# Patient Record
Sex: Female | Born: 1935
Health system: Southern US, Community
[De-identification: ages and names within clinical notes are randomized; demographics above are authoritative.]

## PROBLEM LIST (undated history)

## (undated) DIAGNOSIS — M1990 Unspecified osteoarthritis, unspecified site: Secondary | ICD-10-CM

## (undated) DIAGNOSIS — R0600 Dyspnea, unspecified: Secondary | ICD-10-CM

## (undated) DIAGNOSIS — R32 Unspecified urinary incontinence: Secondary | ICD-10-CM

## (undated) DIAGNOSIS — I499 Cardiac arrhythmia, unspecified: Secondary | ICD-10-CM

## (undated) DIAGNOSIS — I4891 Unspecified atrial fibrillation: Secondary | ICD-10-CM

## (undated) DIAGNOSIS — T4145XA Adverse effect of unspecified anesthetic, initial encounter: Secondary | ICD-10-CM

## (undated) DIAGNOSIS — A498 Other bacterial infections of unspecified site: Secondary | ICD-10-CM

## (undated) DIAGNOSIS — M1710 Unilateral primary osteoarthritis, unspecified knee: Secondary | ICD-10-CM

## (undated) DIAGNOSIS — K5790 Diverticulosis of intestine, part unspecified, without perforation or abscess without bleeding: Secondary | ICD-10-CM

## (undated) DIAGNOSIS — R58 Hemorrhage, not elsewhere classified: Secondary | ICD-10-CM

## (undated) DIAGNOSIS — I5041 Acute combined systolic (congestive) and diastolic (congestive) heart failure: Secondary | ICD-10-CM

## (undated) DIAGNOSIS — M5430 Sciatica, unspecified side: Secondary | ICD-10-CM

## (undated) DIAGNOSIS — G4700 Insomnia, unspecified: Secondary | ICD-10-CM

## (undated) DIAGNOSIS — F32A Depression, unspecified: Secondary | ICD-10-CM

## (undated) DIAGNOSIS — Z9289 Personal history of other medical treatment: Secondary | ICD-10-CM

## (undated) DIAGNOSIS — I447 Left bundle-branch block, unspecified: Secondary | ICD-10-CM

## (undated) DIAGNOSIS — K52832 Lymphocytic colitis: Secondary | ICD-10-CM

## (undated) DIAGNOSIS — R7611 Nonspecific reaction to tuberculin skin test without active tuberculosis: Secondary | ICD-10-CM

## (undated) DIAGNOSIS — M179 Osteoarthritis of knee, unspecified: Secondary | ICD-10-CM

## (undated) DIAGNOSIS — D649 Anemia, unspecified: Secondary | ICD-10-CM

## (undated) DIAGNOSIS — Z8619 Personal history of other infectious and parasitic diseases: Secondary | ICD-10-CM

## (undated) DIAGNOSIS — I509 Heart failure, unspecified: Secondary | ICD-10-CM

## (undated) DIAGNOSIS — K589 Irritable bowel syndrome without diarrhea: Secondary | ICD-10-CM

## (undated) DIAGNOSIS — K219 Gastro-esophageal reflux disease without esophagitis: Secondary | ICD-10-CM

## (undated) DIAGNOSIS — R570 Cardiogenic shock: Secondary | ICD-10-CM

## (undated) DIAGNOSIS — I428 Other cardiomyopathies: Secondary | ICD-10-CM

## (undated) DIAGNOSIS — F419 Anxiety disorder, unspecified: Secondary | ICD-10-CM

## (undated) DIAGNOSIS — F329 Major depressive disorder, single episode, unspecified: Secondary | ICD-10-CM

## (undated) HISTORY — DX: Gastro-esophageal reflux disease without esophagitis: K21.9

## (undated) HISTORY — DX: Insomnia, unspecified: G47.00

## (undated) HISTORY — PX: DILATION AND CURETTAGE OF UTERUS: SHX78

## (undated) HISTORY — PX: CATARACT EXTRACTION W/ INTRAOCULAR LENS  IMPLANT, BILATERAL: SHX1307

## (undated) HISTORY — DX: Sciatica, unspecified side: M54.30

## (undated) HISTORY — DX: Unspecified atrial fibrillation: I48.91

## (undated) HISTORY — DX: Depression, unspecified: F32.A

## (undated) HISTORY — DX: Diverticulosis of intestine, part unspecified, without perforation or abscess without bleeding: K57.90

## (undated) HISTORY — DX: Major depressive disorder, single episode, unspecified: F32.9

## (undated) HISTORY — DX: Heart failure, unspecified: I50.9

## (undated) HISTORY — DX: Lymphocytic colitis: K52.832

---

## 1977-04-11 HISTORY — PX: VAGINAL HYSTERECTOMY: SUR661

## 1998-07-15 ENCOUNTER — Other Ambulatory Visit: Admission: RE | Admit: 1998-07-15 | Discharge: 1998-07-15 | Payer: Self-pay | Admitting: Gastroenterology

## 1998-09-28 ENCOUNTER — Other Ambulatory Visit: Admission: RE | Admit: 1998-09-28 | Discharge: 1998-09-28 | Payer: Self-pay | Admitting: *Deleted

## 1999-10-07 ENCOUNTER — Other Ambulatory Visit: Admission: RE | Admit: 1999-10-07 | Discharge: 1999-10-07 | Payer: Self-pay | Admitting: *Deleted

## 2000-02-07 ENCOUNTER — Encounter: Admission: RE | Admit: 2000-02-07 | Discharge: 2000-02-07 | Payer: Self-pay | Admitting: Family Medicine

## 2000-02-07 ENCOUNTER — Encounter: Payer: Self-pay | Admitting: Family Medicine

## 2001-05-27 ENCOUNTER — Encounter: Payer: Self-pay | Admitting: Emergency Medicine

## 2001-05-27 ENCOUNTER — Emergency Department (HOSPITAL_COMMUNITY): Admission: EM | Admit: 2001-05-27 | Discharge: 2001-05-27 | Payer: Self-pay | Admitting: Emergency Medicine

## 2001-06-19 ENCOUNTER — Encounter: Payer: Self-pay | Admitting: Family Medicine

## 2001-08-31 ENCOUNTER — Encounter: Payer: Self-pay | Admitting: Family Medicine

## 2001-08-31 ENCOUNTER — Encounter: Admission: RE | Admit: 2001-08-31 | Discharge: 2001-08-31 | Payer: Self-pay | Admitting: Family Medicine

## 2002-01-31 ENCOUNTER — Other Ambulatory Visit: Admission: RE | Admit: 2002-01-31 | Discharge: 2002-01-31 | Payer: Self-pay | Admitting: Family Medicine

## 2004-05-31 ENCOUNTER — Ambulatory Visit: Payer: Self-pay | Admitting: Gastroenterology

## 2004-06-14 ENCOUNTER — Encounter: Payer: Self-pay | Admitting: Family Medicine

## 2004-06-14 ENCOUNTER — Ambulatory Visit: Payer: Self-pay | Admitting: Gastroenterology

## 2004-06-14 LAB — HM COLONOSCOPY

## 2004-10-02 ENCOUNTER — Encounter: Admission: RE | Admit: 2004-10-02 | Discharge: 2004-10-02 | Payer: Self-pay | Admitting: Family Medicine

## 2004-10-27 ENCOUNTER — Encounter: Admission: RE | Admit: 2004-10-27 | Discharge: 2004-10-27 | Payer: Self-pay | Admitting: Family Medicine

## 2004-11-17 ENCOUNTER — Encounter: Admission: RE | Admit: 2004-11-17 | Discharge: 2004-11-17 | Payer: Self-pay | Admitting: Family Medicine

## 2008-04-11 DIAGNOSIS — T8859XA Other complications of anesthesia, initial encounter: Secondary | ICD-10-CM

## 2008-04-11 HISTORY — DX: Other complications of anesthesia, initial encounter: T88.59XA

## 2008-04-11 HISTORY — PX: CARDIAC CATHETERIZATION: SHX172

## 2008-04-30 ENCOUNTER — Inpatient Hospital Stay (HOSPITAL_COMMUNITY): Admission: EM | Admit: 2008-04-30 | Discharge: 2008-05-17 | Payer: Self-pay | Admitting: Emergency Medicine

## 2008-04-30 ENCOUNTER — Ambulatory Visit: Payer: Self-pay | Admitting: Critical Care Medicine

## 2008-04-30 ENCOUNTER — Ambulatory Visit: Payer: Self-pay | Admitting: Cardiovascular Disease

## 2008-05-01 ENCOUNTER — Encounter: Payer: Self-pay | Admitting: Cardiovascular Disease

## 2008-05-27 ENCOUNTER — Ambulatory Visit: Payer: Self-pay | Admitting: Cardiology

## 2008-05-30 ENCOUNTER — Ambulatory Visit: Payer: Self-pay | Admitting: Cardiovascular Disease

## 2008-05-30 DIAGNOSIS — R58 Hemorrhage, not elsewhere classified: Secondary | ICD-10-CM | POA: Insufficient documentation

## 2008-05-30 DIAGNOSIS — I428 Other cardiomyopathies: Secondary | ICD-10-CM | POA: Insufficient documentation

## 2008-05-30 DIAGNOSIS — R404 Transient alteration of awareness: Secondary | ICD-10-CM | POA: Insufficient documentation

## 2008-05-30 DIAGNOSIS — I4891 Unspecified atrial fibrillation: Secondary | ICD-10-CM | POA: Insufficient documentation

## 2008-05-30 DIAGNOSIS — Z862 Personal history of diseases of the blood and blood-forming organs and certain disorders involving the immune mechanism: Secondary | ICD-10-CM | POA: Insufficient documentation

## 2008-05-30 LAB — CONVERTED CEMR LAB
ALT: 16 units/L (ref 0–35)
AST: 31 units/L (ref 0–37)
Albumin: 3.4 g/dL — ABNORMAL LOW (ref 3.5–5.2)
Alkaline Phosphatase: 102 units/L (ref 39–117)
BUN: 12 mg/dL (ref 6–23)
Basophils Absolute: 0 10*3/uL (ref 0.0–0.1)
Basophils Relative: 0.2 % (ref 0.0–3.0)
Bilirubin, Direct: 0.2 mg/dL (ref 0.0–0.3)
CO2: 30 meq/L (ref 19–32)
Calcium: 9 mg/dL (ref 8.4–10.5)
Chloride: 98 meq/L (ref 96–112)
Creatinine, Ser: 0.8 mg/dL (ref 0.4–1.2)
Eosinophils Absolute: 0.3 10*3/uL (ref 0.0–0.7)
Eosinophils Relative: 4 % (ref 0.0–5.0)
GFR calc Af Amer: 90 mL/min
GFR calc non Af Amer: 74 mL/min
Glucose, Bld: 107 mg/dL — ABNORMAL HIGH (ref 70–99)
HCT: 37 % (ref 36.0–46.0)
Hemoglobin: 12.4 g/dL (ref 12.0–15.0)
Lymphocytes Relative: 15.1 % (ref 12.0–46.0)
MCHC: 33.6 g/dL (ref 30.0–36.0)
MCV: 95.9 fL (ref 78.0–100.0)
Monocytes Absolute: 0.6 10*3/uL (ref 0.1–1.0)
Monocytes Relative: 10 % (ref 3.0–12.0)
Neutro Abs: 4.5 10*3/uL (ref 1.4–7.7)
Neutrophils Relative %: 70.7 % (ref 43.0–77.0)
Platelets: 399 10*3/uL (ref 150–400)
Potassium: 3.7 meq/L (ref 3.5–5.1)
Pro B Natriuretic peptide (BNP): 397 pg/mL — ABNORMAL HIGH (ref 0.0–100.0)
RBC: 3.86 M/uL — ABNORMAL LOW (ref 3.87–5.11)
RDW: 16.2 % — ABNORMAL HIGH (ref 11.5–14.6)
Sodium: 138 meq/L (ref 135–145)
Total Bilirubin: 0.9 mg/dL (ref 0.3–1.2)
Total Protein: 6.4 g/dL (ref 6.0–8.3)
WBC: 6.4 10*3/uL (ref 4.5–10.5)

## 2008-06-03 ENCOUNTER — Ambulatory Visit: Payer: Self-pay | Admitting: Cardiovascular Disease

## 2008-06-24 ENCOUNTER — Encounter: Payer: Self-pay | Admitting: Cardiovascular Disease

## 2008-06-24 ENCOUNTER — Ambulatory Visit: Payer: Self-pay | Admitting: Cardiovascular Disease

## 2008-06-24 DIAGNOSIS — I510 Cardiac septal defect, acquired: Secondary | ICD-10-CM | POA: Insufficient documentation

## 2008-06-24 DIAGNOSIS — R059 Cough, unspecified: Secondary | ICD-10-CM | POA: Insufficient documentation

## 2008-06-24 DIAGNOSIS — R05 Cough: Secondary | ICD-10-CM

## 2008-06-24 LAB — CONVERTED CEMR LAB
BUN: 12 mg/dL (ref 6–23)
CO2: 37 meq/L — ABNORMAL HIGH (ref 19–32)
Calcium: 9.4 mg/dL (ref 8.4–10.5)
Chloride: 93 meq/L — ABNORMAL LOW (ref 96–112)
Creatinine, Ser: 0.9 mg/dL (ref 0.4–1.2)
GFR calc non Af Amer: 64.81 mL/min (ref >60)
Glucose, Bld: 99 mg/dL (ref 70–99)
Potassium: 3.7 meq/L (ref 3.5–5.1)
Pro B Natriuretic peptide (BNP): 259 pg/mL — ABNORMAL HIGH (ref 0.0–100.0)
Sodium: 139 meq/L (ref 135–145)

## 2008-07-16 ENCOUNTER — Ambulatory Visit: Payer: Self-pay

## 2008-07-16 ENCOUNTER — Encounter: Payer: Self-pay | Admitting: Cardiovascular Disease

## 2008-07-24 ENCOUNTER — Encounter: Payer: Self-pay | Admitting: Cardiovascular Disease

## 2008-07-24 ENCOUNTER — Ambulatory Visit: Payer: Self-pay | Admitting: Cardiovascular Disease

## 2008-08-15 ENCOUNTER — Telehealth (INDEPENDENT_AMBULATORY_CARE_PROVIDER_SITE_OTHER): Payer: Self-pay | Admitting: *Deleted

## 2008-10-28 ENCOUNTER — Ambulatory Visit: Payer: Self-pay | Admitting: Cardiovascular Disease

## 2008-10-28 DIAGNOSIS — I447 Left bundle-branch block, unspecified: Secondary | ICD-10-CM | POA: Insufficient documentation

## 2008-10-29 LAB — CONVERTED CEMR LAB
BUN: 21 mg/dL (ref 6–23)
CO2: 32 meq/L (ref 19–32)
Calcium: 9.2 mg/dL (ref 8.4–10.5)
Chloride: 103 meq/L (ref 96–112)
Creatinine, Ser: 0.9 mg/dL (ref 0.4–1.2)
GFR calc non Af Amer: 64.75 mL/min (ref >60)
Glucose, Bld: 97 mg/dL (ref 70–99)
Potassium: 4.1 meq/L (ref 3.5–5.1)
Pro B Natriuretic peptide (BNP): 35 pg/mL (ref 0.0–100.0)
Sodium: 142 meq/L (ref 135–145)

## 2009-01-14 ENCOUNTER — Ambulatory Visit: Payer: Self-pay | Admitting: Cardiovascular Disease

## 2009-01-19 LAB — CONVERTED CEMR LAB
BUN: 15 mg/dL (ref 6–23)
CO2: 36 meq/L — ABNORMAL HIGH (ref 19–32)
Calcium: 9.3 mg/dL (ref 8.4–10.5)
Chloride: 96 meq/L (ref 96–112)
Creatinine, Ser: 0.9 mg/dL (ref 0.4–1.2)
GFR calc non Af Amer: 64.72 mL/min (ref >60)
Glucose, Bld: 72 mg/dL (ref 70–99)
Potassium: 3.8 meq/L (ref 3.5–5.1)
Pro B Natriuretic peptide (BNP): 50 pg/mL (ref 0.0–100.0)
Sodium: 140 meq/L (ref 135–145)

## 2009-02-20 ENCOUNTER — Telehealth: Payer: Self-pay | Admitting: Cardiovascular Disease

## 2009-04-20 ENCOUNTER — Encounter: Payer: Self-pay | Admitting: Cardiovascular Disease

## 2009-07-13 ENCOUNTER — Ambulatory Visit: Payer: Self-pay | Admitting: Cardiovascular Disease

## 2009-07-17 ENCOUNTER — Encounter: Payer: Self-pay | Admitting: Cardiovascular Disease

## 2009-11-25 ENCOUNTER — Encounter: Admission: RE | Admit: 2009-11-25 | Discharge: 2009-11-25 | Payer: Self-pay | Admitting: Internal Medicine

## 2010-01-14 ENCOUNTER — Ambulatory Visit: Payer: Self-pay | Admitting: Cardiovascular Disease

## 2010-01-18 ENCOUNTER — Telehealth: Payer: Self-pay | Admitting: Cardiovascular Disease

## 2010-03-02 ENCOUNTER — Ambulatory Visit: Payer: Self-pay | Admitting: Family Medicine

## 2010-03-02 DIAGNOSIS — M25569 Pain in unspecified knee: Secondary | ICD-10-CM | POA: Insufficient documentation

## 2010-05-02 ENCOUNTER — Encounter: Payer: Self-pay | Admitting: Cardiovascular Disease

## 2010-05-02 ENCOUNTER — Encounter: Payer: Self-pay | Admitting: Family Medicine

## 2010-05-11 ENCOUNTER — Other Ambulatory Visit: Payer: Self-pay | Admitting: Family Medicine

## 2010-05-11 ENCOUNTER — Ambulatory Visit
Admission: RE | Admit: 2010-05-11 | Discharge: 2010-05-11 | Payer: Self-pay | Source: Home / Self Care | Attending: Family Medicine | Admitting: Family Medicine

## 2010-05-11 ENCOUNTER — Telehealth (INDEPENDENT_AMBULATORY_CARE_PROVIDER_SITE_OTHER): Payer: Self-pay | Admitting: *Deleted

## 2010-05-11 LAB — BASIC METABOLIC PANEL
BUN: 20 mg/dL (ref 6–23)
CO2: 34 mEq/L — ABNORMAL HIGH (ref 19–32)
Calcium: 9.3 mg/dL (ref 8.4–10.5)
Chloride: 100 mEq/L (ref 96–112)
Creatinine, Ser: 0.9 mg/dL (ref 0.4–1.2)
GFR: 65.33 mL/min (ref >60.00)
Glucose, Bld: 93 mg/dL (ref 70–99)
Potassium: 3.7 mEq/L (ref 3.5–5.1)
Sodium: 143 mEq/L (ref 135–145)

## 2010-05-11 LAB — LIPID PANEL
Cholesterol: 218 mg/dL — ABNORMAL HIGH (ref 0–200)
HDL: 60.8 mg/dL (ref >39.00)
Total CHOL/HDL Ratio: 4
Triglycerides: 134 mg/dL (ref 0.0–149.0)
VLDL: 26.8 mg/dL (ref 0.0–40.0)

## 2010-05-11 LAB — HEPATIC FUNCTION PANEL
ALT: 12 U/L (ref 0–35)
AST: 22 U/L (ref 0–37)
Albumin: 4 g/dL (ref 3.5–5.2)
Alkaline Phosphatase: 86 U/L (ref 39–117)
Bilirubin, Direct: 0.1 mg/dL (ref 0.0–0.3)
Total Bilirubin: 0.6 mg/dL (ref 0.3–1.2)
Total Protein: 6.8 g/dL (ref 6.0–8.3)

## 2010-05-11 LAB — TSH: TSH: 2.22 u[IU]/mL (ref 0.35–5.50)

## 2010-05-11 LAB — LDL CHOLESTEROL, DIRECT: Direct LDL: 97.9 mg/dL

## 2010-05-13 ENCOUNTER — Encounter: Payer: Self-pay | Admitting: Family Medicine

## 2010-05-13 ENCOUNTER — Encounter (INDEPENDENT_AMBULATORY_CARE_PROVIDER_SITE_OTHER): Payer: Medicare Other | Admitting: Family Medicine

## 2010-05-13 DIAGNOSIS — Z0100 Encounter for examination of eyes and vision without abnormal findings: Secondary | ICD-10-CM

## 2010-05-13 DIAGNOSIS — Z23 Encounter for immunization: Secondary | ICD-10-CM

## 2010-05-13 DIAGNOSIS — M25569 Pain in unspecified knee: Secondary | ICD-10-CM

## 2010-05-13 DIAGNOSIS — Z0000 Encounter for general adult medical examination without abnormal findings: Secondary | ICD-10-CM

## 2010-05-13 DIAGNOSIS — I4891 Unspecified atrial fibrillation: Secondary | ICD-10-CM

## 2010-05-13 NOTE — Assessment & Plan Note (Signed)
Summary: [facility]  Medications Added TORSEMIDE 20 MG TABS (TORSEMIDE) 1 tab by mouth once daily RANITIDINE HCL 75 MG TABS (RANITIDINE HCL) as needed      Allergies Added: ! COUMADIN  CC:  no complaints.  History of Present Illness: [name] is following up for congestive heart failure.  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  Her heart catheterization was normal.  She had a horrendous course in the hospital.  She was intolerant to ACE inhibitors.  She had a massive rectus muscle bleed on Coumadin.  She was placed on this for atrial fibrillation.  Portions she is maintaining sinus rhythm and is now off her amiodarone as well.  Her ejection fraction in the hospital was 10-20%.  I reviewed the echocardiogram that she just had on April 8.  She's had significant improvement with an EF of 35-40%.  She feels much improved.  Her abdomen is soft and the rectus sheath hematoma his vomiting resolved.  She is walking on a regular basis.  She watches her sodium carefully.  She weighs herself daily.  She just her Lasix is appropriate.  After I reviewed her echocardiogram are reviewed her lab work.  Her BNP continues to improve.  Lab work from 3 1610 showed a potassium of 3.7 a creatinine of 0.9 the BNP has decreased to 259.  I told her that given her clinical improvement and improvement in ejection fraction we would continue beta blockers and diuretics for the time being.  At some point I would like to rechallenge her with an ARB or an ACE.  She saw Dr. Jenne Pane for ENT.  Unfortunately her swallowing study never got done.  I suspect she continues to [employer].  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 1 Tab  At Bedtime Due To Nausea Suppose To Be Two Times A Day 2)  Torsemide 20 Mg Tabs (Torsemide) .[name].. 1 Tab By Mouth Once Daily 3)  Ranitidine Hcl 75 Mg Tabs (Ranitidine Hcl) .... As Needed  Allergies (verified): 1)  ! Coumadin  Past History:  Past Medical History:    Atrial Fibrillation    G E R D    sciatica    insomnia    DCM/CHF (06-26-2008)  Family History:    Father:decease age 20 ca    Mother:decease age 11 TB     (06/26/08)  Social History:    Retired     Married     Tobacco Use - No.     Alcohol Use - yes occ beer or wine    Regular Exercise - yes very active    Drug Use - no     (26-Jun-2008)  Review of Systems       Denies fever, malais, weight loss, blurry vision, decreased visual acuity, cough, sputum, SOB, hemoptysis, pleuritic pain, palpitaitons, heartburn, abdominal pain, melena, lower extremity edema, claudication, or rash. cough is improved  Vital Signs:  Patient profile:   77 year old female Height:      65 inches Weight:      161 pounds Pulse rate:   72 / minute Resp:     14 per minute BP sitting:   118 /  68  (left arm)  Vitals Entered By: Kem Parkinson (July 24, 2008 10:56 AM)  Physical Exam  General:  Affect appropriate Healthy:  appears stated age HEENT: normal Neck supple with no adenopathy JVP normal no bruits no thyromegaly Lungs clear with no wheezing and good diaphragmatic motion Heart:  S1/S2 no murmur,rub, gallop or click PMI normal Abdomen: benighn, BS positve, no tenderness, no AAA no bruit.  No HSM or HJR Distal pulses intact with no bruits No edema Neuro non-focal Skin warm and dry    Impression & Recommendations:  Problem # 1:  CARDIOMYOPATHY, DILATED (ICD-425.4) Improved EF by echo.  functional class 2 now.  Consdier rechallenge with ACE next visit The following medications were removed from the medication list:    Digoxin 0.125 Mg Tabs (Digoxin) .[name]... 1 tab at  bedtime    Amiodarone Hcl 200 Mg Tabs (Amiodarone hcl) .[name]... 1 tab by mouth at bedtime Her updated medication list for this problem includes:    Carvedilol 6.25 Mg Tabs (Carvedilol) .[name]... 1 tab  at bedtime due to nausea suppose to be two times a day    Torsemide 20 Mg Tabs (Torsemide) .[name]... 1 tab by mouth once daily  Problem # 2:  HYPONATREMIA, HX OF (ICD-V12.3) Resolved.  Likely secondary to psychotropic meds more than diuretics  Problem # 3:  ATRIAL FIBRILLATION (ICD-427.31) Maintainng NSR off amiodarone.  Cannot take coumadin The following medications were removed from the medication list:    Digoxin 0.125 Mg Tabs (Digoxin) .[name]... 1 tab at bedtime    Amiodarone Hcl 200 Mg Tabs (Amiodarone hcl) .[name]... 1 tab by mouth at bedtime Her updated medication list for this problem includes:    Carvedilol 6.25 Mg Tabs (Carvedilol) .[name]... 1 tab  at bedtime due to nausea suppose to be two times a day  Problem # 4:  COUGH (ICD-786.2) F/U Dr. Jenne Pane.  Swallowing study if symptoms worsen The following medications were removed from the medication list:    Amiodarone Hcl 200 Mg Tabs (Amiodarone hcl) .[name]... 1 tab by mouth at bedtime Her updated medication list for this problem includes:    Carvedilol 6.25 Mg Tabs (Carvedilol) .[name]... 1 tab  at bedtime due to nausea suppose to be two times a day  Patient Instructions: 1)  F/U [name] 3 months

## 2010-05-13 NOTE — Assessment & Plan Note (Signed)
Summary: per check out/sf   CC:  pt complains of a discomfort in chest like acheing also pt states she seems to urinate less than usual.  History of Present Illness: [name] seen today in followup for her atrial fibrillation and cardiomyopathy.  She had a significant spontaneous retroperitoneal bleed while on Coumadin.  He has a history of significant hyponatremia.  She has been intolerant to ACE inhibitors due to cough. She is walking daily with her husband.  She takes an extra Lasix 3-4x/month for weight gain. Her dry weight is 156.  She has not had any PND, orthopnea or edema.  She has not had palpitations.  She had a normal heart cath in hospital and has a chronic LBBB with no syncope.  She has a son that works for [employer] in [place]. and recently traveled to see him.  She is getting back to her old self and her DCM seems to have stabilized.  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 1 Tab By Mouth Two Times A Day 2)  Torsemide 20 Mg Tabs (Torsemide) .[name].. 1 Two Times A Day 3)  Sertraline Hcl 50 Mg Tabs (Sertraline Hcl) .[name].. 1 Tab By Mouth Once Daily 4)  Zolpidem Tartrate 10 Mg Tabs (Zolpidem Tartrate) .... 1/2 Tab As Needed 5)  Ra Acid Reducer Max St 150 Mg Tabs (Ranitidine Hcl) .... 1/2 Tab As Needed 6)  Multivitamins   Tabs (Multiple Vitamin) .[name].. 1 Tab By Mouth Once Daily 7)  Aspirin 81 Mg  Tabs (Aspirin) .[name].. 1 Tab By Mouth Once Daily  Allergies (verified): 1)  ! Coumadin  Past History:  Past Medical History: Last updated: 06/29/2008 Atrial Fibrillation G E R D sciatica insomnia DCM/CHF  Past Surgical History: Last updated: 06/29/08 s/p  hysterectomy 1979  Family History: Last updated: 29-Jun-2008 Father:decease age 86 ca Mother:decease age 20 TB  Social History: Last updated: 06-29-2008 Retired  Married  Tobacco Use - No.  Alcohol Use - yes occ beer or wine Regular Exercise - yes very active Drug Use - no  Review of Systems       Denies fever, malais, weight loss, blurry vision, decreased visual acuity, cough, sputum, SOB, hemoptysis, pleuritic pain, palpitaitons, heartburn, abdominal pain, melena, lower extremity edema, claudication, or rash. All other systems reviewed and negative  Vital Signs:  Patient profile:   77 year old female Height:      65 inches Weight:      156 pounds BMI:     26.05 Pulse rate:   74 / minute BP sitting:   110 / 70  (left arm)  Vitals Entered By: Kem Parkinson (January 14, 2009 9:33 AM)  Physical Exam  General:  Affect appropriate Healthy:  appears stated age HEENT: normal Neck supple with no adenopathy JVP normal no bruits no thyromegaly Lungs clear with no wheezing and good diaphragmatic motion Heart:  S1/S2 no murmur,rub, gallop or click PMI normal Abdomen: benighn, BS positve, no tenderness, no AAA no bruit.  No HSM or HJR Distal pulses intact with no bruits No edema Neuro non-focal Skin warm and dry    Impression & Recommendations:  Problem # 1:  CARDIOMYOPATHY,  DILATED (ICD-425.4) Stable check BNP and BMET today.  f/U MRI in a year Her updated medication list for this problem includes:    Carvedilol 6.25 Mg Tabs (Carvedilol) .[name]... 1 tab by mouth two times a day    Torsemide 20 Mg Tabs (Torsemide) .[name]... 1 two times a day    Aspirin 81 Mg Tabs (Aspirin) .[name]... 1 tab by mouth once daily  Orders: TLB-BNP (B-Natriuretic Peptide) (83880-BNPR)  Problem # 2:  LBBB (ICD-426.3) Related to DCM no hight grade block or syncope Her updated medication list for this problem includes:    Carvedilol 6.25 Mg Tabs (Carvedilol) .[name]... 1 tab by mouth two times a day     Aspirin 81 Mg Tabs (Aspirin) .[name]... 1 tab by mouth once daily  Problem # 3:  ATRIAL FIBRILLATION (ICD-427.31) Maintaing NSR with previous life threatening bleed on coumadin.  Continue low dose ASA Her updated medication list for this problem includes:    Carvedilol 6.25 Mg Tabs (Carvedilol) .[name]... 1 tab by mouth two times a day    Aspirin 81 Mg Tabs (Aspirin) .[name]... 1 tab by mouth once daily  Problem # 4:  HYPONATREMIA, HX OF (ICD-V12.3) Related to meds and CHF  improved check BMET today Orders: TLB-BMP (Basic Metabolic Panel-BMET) (80048-METABOL)  Patient Instructions: 1)  Your physician recommends that you schedule a follow-up appointment in: 6 MONTHS DUE IN APRIL 2011 2)  Your physician recommends that you return for lab work in: TODAY BMET BNP 428.0 3)  Your physician recommends that you continue on your current medications as directed. Please refer to the Current Medication list given to you today. Prescriptions: TORSEMIDE 20 MG TABS (TORSEMIDE) 1 two times a day  #60 x 11   Entered by:   Scherrie Bateman, LPN   Authorized by:   Colon Branch, MD, [facility]   Signed by:   Scherrie Bateman, LPN on 16/01/9603   Method used:   Electronically to        [facility]* (retail)       [address]       [place], [place]  [address]       Ph: [phone]       Fax: [phone]   RxID:   [id number]

## 2010-05-13 NOTE — Assessment & Plan Note (Signed)
History of Present Illness: [name] is seen today in followup.  She has a nonischemic retinopathy.  He continues to have nausea.  Will stop her digoxin and amiodarone.  He had a significant retroperitoneal and rectus sheath bleed on Coumadin.  He was placed on this for atrial fibrillation.  He is recovered from this.  Her abdomen is less tender.  Her hematocrit has been stable.  He continues to want to not take Lasix.  I told her given the extent of her LV dysfunction she needs it.  We'll followup a BNP and BMET  today. she has been maintaining sinus rhythm without palpitations presyncope.  She is not a Coumadin candidate due to her life-threatening bleed.  during her hospitalization she had a heart catheterization.she has a chronic left bundle branch block with no evidence of coronary disease.  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 1 Tab  At Bedtime Due To Nausea Suppose To Be Two Times A Day 2)  Torsemide 20 Mg Tabs (Torsemide) .... 1/2 Tab or 1 Tab By Mouth Once Daily 3)  Digoxin 0.125 Mg Tabs (Digoxin) .[name].. 1 Tab At Bedtime 4)  Amiodarone Hcl 200 Mg Tabs (Amiodarone Hcl) .[name].. 1 Tab By Mouth At Bedtime  Past History:  Past Medical History:    Atrial Fibrillation    G E R D    sciatica    insomnia    DCM/CHF (05/30/2008)  Review of Systems  The patient denies fever, vision loss, decreased hearing, hoarseness, chest pain, syncope, headaches, and hemoptysis.         nausea no vohmiting,  contnued probable reflux wit cough.  Has seen ENT and swallowing study ordered.  Weight stable decreasing edema.  Abdominal pain improved.   Vital Signs:  Patient profile:   77 year old female Weight:       163 pounds Pulse rate:   73 / minute Resp:     12 per minute BP sitting:   131 / 72  (right arm)  Vitals Entered By: Kem Parkinson (June 24, 2008 12:01 PM)   Impression & Recommendations:  Problem # 1:  CARDIOMYOPATHY, DILATED (ICD-425.4) Stable continue current dose of diuretic.  Did not tolerate ARB/ACE in hospital with cough.  Check BMET BNP today. F/U MRI to quantitate EF. Her updated medication list for this problem includes:    Carvedilol 6.25 Mg Tabs (Carvedilol) .[name]... 1 tab  at bedtime due to nausea suppose to be two times a day    Torsemide 20 Mg Tabs (Torsemide) .[name]... 1/2 tab or 1 tab by mouth once daily    Digoxin 0.125 Mg Tabs (Digoxin) .[name]... 1 tab at bedtime    Amiodarone Hcl 200 Mg Tabs (Amiodarone hcl) .[name]... 1 tab by mouth at bedtime  Problem # 2:  ATRIAL FIBRILLATION (ICD-427.31) Maintaining NSR stop digoxen and amiodarone due to nausea.  No coumadin due to abdominal bleed Her updated medication list for this problem includes:    Carvedilol 6.25 Mg Tabs (Carvedilol) .[name]... 1 tab  at bedtime due to nausea suppose to be two times a day    Digoxin 0.125 Mg Tabs (Digoxin) .[name]... 1 tab at bedtime    Amiodarone Hcl 200 Mg Tabs (Amiodarone hcl) .Marland KitchenMarland KitchenMarland KitchenMarland Kitchen  1 tab by mouth at bedtime  Problem # 3:  COUGH (ICD-786.2) F/U ENT for swallowing study .  May need to rechallenge with ARB if EF stays bad and BP high enough. Her updated medication list for this problem includes:    Carvedilol 6.25 Mg Tabs (Carvedilol) .[name]... 1 tab  at bedtime due to nausea suppose to be two times a day    Amiodarone Hcl 200 Mg Tabs (Amiodarone hcl) .[name]... 1 tab by mouth at bedtime  Patient Instructions: 1)  BMET and BNP today 2)  F/U MRI next 2 weeks 3)  F/U [name] 4-6 weeks 4)  Stop Digoxen and Amiodarone.

## 2010-05-13 NOTE — Assessment & Plan Note (Signed)
Summary: 6 month rov/sl   CC:  dizziness thinks it due to med.  History of Present Illness: [name] seen today in followup for her atrial fibrillation and cardiomyopathy.  She had a significant spontaneous retroperitoneal bleed while on Coumadin.  He has a history of significant hyponatremia.  She has been intolerant to ACE inhibitors due to cough. She is walking daily with her husband.  She takes an extra Lasix 3-4x/month for weight gain. Her dry weight is 156.  She has not had any PND, orthopnea or edema.  She has not had palpitations.  She had a normal heart cath in hospital and has a chronic LBBB with no syncope.  She has a son that works for [employer] in [place]. and recently traveled to see him.  She is getting back to her old self and her DCM seems to have stabilized.  Echo 07/16/2008 EF 35-40%.  Will do a MRI to reassess EF at that time.    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 Take One Tablet By Mouth Daily 2)  Torsemide 20 Mg Tabs (Torsemide) .[name].. 1 Two Times A Day 3)  Sertraline Hcl 50 Mg Tabs (Sertraline Hcl) .[name].. 1 Tab By Mouth Once Daily 4)  Multivitamins   Tabs (Multiple Vitamin) .[name].. 1 Tab By Mouth Once Daily 5)  Aspirin 81 Mg  Tabs (Aspirin) .[name].. 1 Tab By Mouth Once Daily 6)  Ranitidine Hcl 150 Mg Caps (Ranitidine Hcl) .... 1/2 Tab By Mouth Two Times A Day As Needed 7)  Mega Red .[name].. 1 Tab  Daily  Allergies (verified): 1)  ! Coumadin  Past History:  Past Medical History: Last updated: Jun 27, 2008 Atrial Fibrillation G E R D sciatica insomnia DCM/CHF  Past Surgical History: Last updated: 06-27-08 s/p  hysterectomy 1979  Family History: Last updated: 06/27/08 Father:decease age 71 ca Mother:decease age 73 TB  Social History: Last updated: 2008-06-27 Retired  Married  Tobacco Use - No.  Alcohol Use - yes occ beer or wine Regular Exercise - yes very active Drug Use - no  Review of Systems       Denies fever, malais, weight loss, blurry vision, decreased visual acuity, cough, sputum, SOB, hemoptysis, pleuritic pain, palpitaitons, heartburn, abdominal pain, melena, l, claudication, or rash.   Vital Signs:  Patient profile:   77 year old female Height:      65 inches Weight:      162 pounds BMI:     27.06 Pulse rate:   70 / minute Resp:     12 per minute BP sitting:   115 / 70  (left arm)  Vitals Entered By: Kem Parkinson (July 13, 2009 9:59 AM)  Physical Exam  General:  Affect appropriate Healthy:  appears stated age HEENT: normal Neck supple with no adenopathy JVP normal no bruits no thyromegaly Lungs clear with no wheezing and good diaphragmatic motion Heart:  S1/S2 no murmur,rub, gallop or click PMI normal Abdomen: benighn, BS positve, no tenderness, no AAA no bruit.  No HSM or HJR Distal pulses intact with no bruits Trace edema Neuro non-focal Skin warm and dry    Impression & Recommendations:  Problem #  1:  CARDIOMYOPATHY, DILATED (ICD-425.4) Stable.  Not on ACE due to cough and profound hyponatremia.  F/U MRI in 6 months Her updated medication list for this problem includes:    Metoprolol Succinate 25 Mg Xr24h-tab (Metoprolol succinate) .[name]... Take one tablet by mouth daily    Torsemide 20 Mg Tabs (Torsemide) .[name]... 1 two times a day    Aspirin 81 Mg Tabs (Aspirin) .[name]... 1 tab by mouth once daily  Problem # 2:  LBBB (ICD-426.3) Stable no evidence of high grade heart block.   Her updated medication list for this problem includes:    Metoprolol Succinate 25 Mg Xr24h-tab (Metoprolol succinate) .[name]... Take one tablet by mouth daily    Aspirin 81 Mg  Tabs (Aspirin) .[name]... 1 tab by mouth once daily  Problem # 3:  ATRIAL FIBRILLATION (ICD-427.31) Maint NSR.  No coumadin with prev. life threatening hemorhage. Her updated medication list for this problem includes:    Metoprolol Succinate 25 Mg Xr24h-tab (Metoprolol succinate) .[name]... Take one tablet by mouth daily    Aspirin 81 Mg Tabs (Aspirin) .[name]... 1 tab by mouth once daily  Problem # 4:  HYPONATREMIA, HX OF (ICD-V12.3) On two times a day diuretic for LE edema.  Edema more likely from varicaosities.  BNP normal in January.  F/U labs [facility] this week to recheck K,Na, and Cr   EKG Report  Procedure date:  07/13/2009  Findings:      NSR 70 PVC LBBB Abnormal ECG

## 2010-05-13 NOTE — Assessment & Plan Note (Signed)
Summary: f74m/dm   CC:  no complaints.  History of Present Illness: [name] seen today in followup for her atrial fibrillation and cardiomyopathy.  She had a significant spontaneous retroperitoneal bleed while on Coumadin.  He has a history of significant hyponatremia.  She has been intolerant to ACE inhibitors due to cough. She is walking daily with her husband.  She takes an extra Lasix 3-4x/month for weight gain. Her dry weight is 156.  She has not had any PND, orthopnea or edema.  She has not had palpitations.  She had a normal heart cath in hospital and has a chronic LBBB with no syncope.  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 Take One Tablet By Mouth Daily 2)  Torsemide 20 Mg Tabs (Torsemide) .[name].. 1 Two Times A Day 3)  Sertraline Hcl 50 Mg Tabs (Sertraline Hcl) .... 1/2 Tab By Mouth Once Daily 4)  Multivitamins   Tabs (Multiple Vitamin) .[name].. 1 Tab By Mouth Once Daily 5)  Aspirin 81 Mg  Tabs (Aspirin) .[name].. 1 Tab By Mouth Once Daily 6)  Ranitidine Hcl 150 Mg Caps (Ranitidine Hcl) .... 1/2 Tab By Mouth Two Times A Day As Needed 7)  Zolpidem Tartrate 10 Mg Tabs (Zolpidem Tartrate) .... As Needed  Allergies (verified): 1)  ! Coumadin  Past History:  Past Medical History: Last updated: 05-31-08 Atrial Fibrillation G E R D sciatica insomnia DCM/CHF  Past Surgical History: Last updated: 05/31/08 s/p hysterectomy 1979  Family History: Last updated: 2008-05-31 Father:decease age 73 ca Mother:decease age 32 TB  Social  History: Last updated: 31-May-2008 Retired  Married  Tobacco Use - No.  Alcohol Use - yes occ beer or wine Regular Exercise - yes very active Drug Use - no  Review of Systems       Denies fever, malais, weight loss, blurry vision, decreased visual acuity, cough, sputum, SOB, hemoptysis, pleuritic pain, palpitaitons, heartburn, abdominal pain, melena,  claudication, or rash.   Vital Signs:  Patient profile:   77 year old female Height:      65 inches Weight:      162 pounds BMI:     27.06 Pulse rate:   73 / minute Resp:     12 per minute BP sitting:   117 / 68  (left arm)  Vitals Entered By: Kem Parkinson (January 14, 2010 10:11 AM)  Physical Exam  General:  Affect appropriate Healthy:  appears stated age HEENT: normal Neck supple with no adenopathy JVP normal no bruits no thyromegaly Lungs clear with no wheezing and good diaphragmatic motion Heart:  S1/S2 no murmur,rub, gallop or click PMI normal Abdomen: benighn, BS positve, no tenderness, no AAA no bruit.  No HSM or HJR Distal pulses intact with no bruits No edema Neuro non-focal Skin warm and dry    Impression & Recommendations:  Problem # 1:  LBBB (ICD-426.3) No evidence of high grade heart block or syncope.  ECG in 6 months Her updated medication list for this problem includes:    Metoprolol Succinate 25 Mg Xr24h-tab (Metoprolol  succinate) .[name]... Take one tablet by mouth daily    Aspirin 81 Mg Tabs (Aspirin) .[name]... 1 tab by mouth once daily  Problem # 2:  CARDIOMYOPATHY, DILATED (ICD-425.4) Improved.  F/U echo in 6 months.  Intolerant to ACE.   Her updated medication list for this problem includes:    Metoprolol Succinate 25 Mg Xr24h-tab (Metoprolol succinate) .[name]... Take one tablet by mouth daily    Torsemide 20 Mg Tabs (Torsemide) .[name]... 1 two times a day    Aspirin 81 Mg Tabs (Aspirin) .[name]... 1 tab by mouth once daily  Orders: Echocardiogram (Echo)  Problem # 3:  ATRIAL FIBRILLATION  (ICD-427.31) Resolved.  Continue BB and ASA.  Had hemorage with coumadin and should not recieve in future Her updated medication list for this problem includes:    Metoprolol Succinate 25 Mg Xr24h-tab (Metoprolol succinate) .[name]... Take one tablet by mouth daily    Aspirin 81 Mg Tabs (Aspirin) .[name]... 1 tab by mouth once daily  Orders: Echocardiogram (Echo)  Patient Instructions: 1)  Your physician recommends that you schedule a follow-up appointment in: 6 MONTHS ECHO SAME DAY 2)  Your physician recommends that you continue on your current medications as directed. Please refer to the Current Medication list given to you today. 3)  Your physician has requested that you have an echocardiogram.  Echocardiography is a painless test that uses sound waves to create images of your heart. It provides your doctor with information about the size and shape of your heart and how well your heart's chambers and valves are working.  This procedure takes approximately one hour. There are no restrictions for this procedure. IN 6 MONTHS

## 2010-05-13 NOTE — Assessment & Plan Note (Signed)
Summary: NEW MEDICARE PT TO BE ESTABLISHED/JRR   Vital Signs:  Patient profile:   77 year old female Height:      65 inches Weight:      163.25 pounds Temp:     98.3 degrees F oral Pulse rate:   72 / minute Pulse rhythm:   regular BP sitting:   116 / 74  (left arm) Cuff size:   regular  Vitals Entered By: Sydell Axon LPN (March 02, 2010 2:07 PM) CC: New patient to get established   History of Present Illness: New patient to clinic.    H/o AF and sig bleed on coumadin.  No CP/SOB/BLE edema.  ACE intolerant.   L knee pain.  Started after twisting it and better after using ice.  Getting better now.  Had xray that showed arthritis per patient.  Has graduated from wearing a brace.  Able to bear weight.    Allergies: 1)  ! Coumadin 2)  ! Ace Inhibitors  Past History:  Past Medical History: Atrial Fibrillation G E R D R sciatica insomnia- off ambien as of 10/11 DCM/CHF h/o depression- prev on zoloft   Past Surgical History: s/p hysterectomy 1979 (ovaries left intact)  Family History: Reviewed history from 05/30/2008 and no changes required. Father:decease age 24 ca Mother:decease age 81 TB  Social History: Reviewed history from 05/30/2008 and no changes required. Retired, former [employer] Married, 1953 Lives with husband 2 grown children, both in [place] Tobacco Use - No.  Alcohol Use - yes occ beer or wine Regular Exercise - yes very active, walking several times a week Drug Use - no enjoys walking, teaches Sunday school, reading  Review of Systems       See HPI.  Otherwise negative.    Physical Exam  General:  GEN: nad, alert and oriented HEENT: mucous membranes moist NECK: supple w/o LA CV: rrr.  PULM: ctab, no inc wob ABD: soft, +bs EXT: no edema SKIN: no acute rash  L knee w/o erythema/bruising.  Normal range of motion but crepitus noted at patella.  ACL/MCL/LCL intact on testing.  She is pointing to the distal ITB and lateral  inferior patella as the prev source of pain.    Impression & Recommendations:  Problem # 1:  KNEE PAIN, LEFT (ICD-719.46) Likely resolving flare of OA due to mechanical stress +/- ITB strain with subsequent impact on patella.  Improving and no need for further work up.  Fu as needed.  Her updated medication list for this problem includes:    Aspirin 81 Mg Tabs (Aspirin) .[name]... 1 tab by mouth once daily    Ibuprofen 200 Mg Tabs (Ibuprofen) .[name]... Take 2 by mouth daily  Problem # 2:  ATRIAL FIBRILLATION (ICD-427.31) H/o AF, regular today.  Stable.  BB refilled.  has had follow up with cards.  Appreciate cards help.   [facility]  Her updated medication list for this problem includes:    Metoprolol Succinate 25 Mg Xr24h-tab (Metoprolol succinate) .[name]... Take one tablet by mouth daily    Aspirin 81 Mg Tabs (Aspirin) .[name]... 1 tab by mouth once daily  Orders: Prescription Created Electronically [phone])  Complete Medication List: 1)  Metoprolol Succinate 25 Mg Xr24h-tab (Metoprolol succinate) .... Take one tablet by mouth daily 2)  Torsemide 20 Mg Tabs (Torsemide) .[name].. 1 two times a day 3)  Multivitamins Tabs (Multiple vitamin) .[name].. 1 tab by mouth once daily 4)  Aspirin 81 Mg Tabs (Aspirin) .[name].. 1 tab by mouth once daily 5)  Ranitidine Hcl 150 Mg Caps (Ranitidine hcl) .... 1/2 tab by mouth two times a day as needed 6)  Ibuprofen 200 Mg Tabs (Ibuprofen) .... Take 2 by mouth daily  Patient Instructions: 1)  I would keep using the ice as needed for you knee.  We'll await your records.  Let us know if you have other concerns in the meantime.  Take care.  Prescriptions: METOPROLOL SUCCINATE 25 MG XR24H-TAB (METOPROLOL SUCCINATE) Take one tablet by mouth daily  #90 x 3   Entered and Authorized by:   Crawford Givens MD   Signed by:   Crawford Givens MD on 03/02/2010   Method used:   Electronically to        [facility]* (retail)        [address]       [place], [place]  [address]       Ph: [phone]       Fax: [phone]   RxID:   [id number]    Orders Added: 1)  New Patient Level II [99202] 2)  Prescription Created Electronically [[id number]]   Immunization History:  Influenza Immunization History:    Influenza:  historical (12/10/2009)   Immunization History:  Influenza Immunization History:    Influenza:  Historical (12/10/2009)  Current Allergies (reviewed today): ! COUMADIN ! ACE INHIBITORS

## 2010-05-13 NOTE — Progress Notes (Signed)
Summary: sided effect from carvedilol (discuss w/ PN)  Phone Note Call from Patient Call back at [facility] Phone [phone]   Caller: Patient Reason for Call: Talk to Nurse Complaint: Nausea/Vomiting/Diarrhea Details for Reason: CARVEDILOL 6.25 MG TABS 1 tab by mouth two times a day. c/o for the last month, tiredness, diarrhea, can meds be changed to something else.  Initial call taken by: Lorne Skeens,  February 20, 2009 10:26 AM  Follow-up for Phone Call        I spoke with the pt. She feels that her symptoms started back in 1/10 with some slight diarrhea. Her symptoms of diarrhea and tiredness got worse in 6/10 and she has continued to have worsening symptoms. She has had no change in her weight. She feels that her symptoms are related to coreg. I explained to the pt that we would need to discuss this with [name] prior to making any med changes to see what his reccomendations are. [name] will be back in the office on monday 02/23/09. We will discuss this with him and have Stanton Kidney Mathis-RN call her back on monday. The pt is agreeable with this. Sherri Rad, RN, BSN  February 20, 2009 10:51 AM   Additional Follow-up for Phone Call Additional follow up Details #1::        Can stop coreg and start Toprol 25mg  Additional Follow-up by: Colon Branch, MD, [facility],  February 20, 2009 3:44 PM     Appended Document: sided effect from carvedilol (discuss w/ PN) pt aware of med change, will call back with problems Deliah Goody, RN  February 23, 2009 4:07 PM    Clinical Lists Changes  Medications: Changed medication from CARVEDILOL 6.25 MG TABS (CARVEDILOL) 1 tab by mouth two times a day to METOPROLOL SUCCINATE 25 MG XR24H-TAB (METOPROLOL SUCCINATE) Take one tablet by mouth daily - Signed Rx of METOPROLOL SUCCINATE 25 MG XR24H-TAB (METOPROLOL SUCCINATE) Take one tablet by mouth daily;  #30 x 12;  Signed;  Entered by: Deliah Goody, RN;  Authorized by: Colon Branch, MD, [facility];   Method used: Electronically to [facility]*, [address], [place], [place]  [address], Ph: [phone], Fax: [phone]    Prescriptions: METOPROLOL SUCCINATE 25 MG XR24H-TAB (METOPROLOL SUCCINATE) Take one tablet by mouth daily  #30 x 12   Entered by:   Deliah Goody, RN   Authorized by:   Colon Branch, MD, [facility]   Signed by:   Deliah Goody, RN on 02/23/2009   Method used:   Electronically to        [facility]* (retail)       [address]       [place], [place]  [address]       Ph: [phone]       Fax: [phone]   RxID:   [id number]

## 2010-05-13 NOTE — Assessment & Plan Note (Signed)
Summary: 3 MONTH/DMP   CC:  no complaints.  History of Present Illness: [name] seen today in followup for her atrial fibrillation and cardiomyopathy.  She had a significant spontaneous retroperitoneal bleed while on Coumadin.  He has a history of significant hyponatremia.  She has been intolerant to ACE inhibitors due to cough.  She is starting to get back to her old self.  She has improved exercise tolerance with no PND or orthopnea.  There is minimal lower extremity edema.  She has not had recurrent palpitations.  He does get occasional dizziness.  I reviewed her echocardiogram from April 2010 and her EF and improved to 35-40%.  During her prolonged hospitalization she had a normal heart catheterization.  She would appear to have a chronic left bundle branch block.  No evidence of high-grade heart block or recurrent A. fib.  She needs a note so that she can participate in a swimming program otherwise her and her husband are doing well.  They traveled to [facility] recently.  They're also babysitting her relatives dog name Brownie.  I told her that I would like to get BMET  and a BNP today.  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 1 Tab By Mouth Two Times A Day 2)  Torsemide 20 Mg Tabs (Torsemide) .[name].. 1 Tab By Mouth Once Daily 3)  Sertraline Hcl 50 Mg Tabs (Sertraline Hcl) .[name].. 1 Tab By Mouth Once Daily 4)  Zolpidem Tartrate 10 Mg Tabs (Zolpidem Tartrate) .... 1/2 Tab As Needed 5)  Ra Acid Reducer Max St 150 Mg Tabs (Ranitidine Hcl) .... 1/2 Tab As Needed  Allergies (verified): 1)  ! Coumadin  Past History:  Past Medical  History: Last updated: Jun 09, 2008 Atrial Fibrillation G E R D sciatica insomnia DCM/CHF  Past Surgical History: Last updated: 06-09-08 s/p hysterectomy 1979  Family History: Last updated: 06-09-08 Father:decease age 32 ca Mother:decease age 20 TB  Social History: Last updated: 06/09/08 Retired  Married  Tobacco Use - No.  Alcohol Use - yes occ beer or wine Regular Exercise - yes very active Drug Use - no   Review of Systems       Denies fever, malais, weight loss, blurry vision, decreased visual acuity, cough, sputum, SOB, hemoptysis, pleuritic pain, palpitaitons, heartburn, abdominal pain, melena, lower extremity edema, claudication, or rash. All other systems reviewed and negative except as noted in HPI  Vital Signs:  Patient profile:   77 year old female Height:      65 inches Weight:      156 pounds BMI:     26.05 Pulse rate:   76 / minute Resp:     12 per minute BP sitting:   108 / 67  (right arm)  Vitals Entered By: Kem Parkinson (October 28, 2008 9:25 AM)  Physical Exam  General:  Affect appropriate Healthy:  appears stated age HEENT: normal Neck supple with no adenopathy JVP normal no bruits no thyromegaly Lungs clear with no wheezing and good diaphragmatic motion Heart:  S1/S2 no murmur,rub, gallop or click PMI normal Abdomen: benighn, BS positve, no tenderness, no AAA no bruit.  No HSM  or HJR Distal pulses intact with no bruits No edema Neuro non-focal Skin warm and dry    Impression & Recommendations:  Problem # 1:  CARDIOMYOPATHY, DILATED (ICD-425.4) Class 1 F/U MRI or echo in October.  Check BMET/BNP possibly decrease diuretic Her updated medication list for this problem includes:    Carvedilol 6.25 Mg Tabs (Carvedilol) .[name]... 1 tab by mouth two times a day    Torsemide 20 Mg Tabs (Torsemide) .[name]... 1 tab by mouth once daily  Orders: TLB-BMP (Basic Metabolic Panel-BMET) (80048-METABOL) TLB-BNP (B-Natriuretic Peptide)  (83880-BNPR)  Problem # 2:  ATRIAL FIBRILLATION (ICD-427.31) Maintianing NSR no coumadin due to previous life threatening bleed Her updated medication list for this problem includes:    Carvedilol 6.25 Mg Tabs (Carvedilol) .[name]... 1 tab by mouth two times a day  Problem # 3:  LBBB (ICD-426.3) Chronic no evidence of high grade heart block.  Likely from DCM.  Normal cath with no CAD Her updated medication list for this problem includes:    Carvedilol 6.25 Mg Tabs (Carvedilol) .[name]... 1 tab by mouth two times a day  Patient Instructions: 1)  Your physician recommends that you schedule a follow-up appointment in: 3 MONTHS  Prevention & Chronic Care Immunizations   Influenza vaccine: Not documented    Tetanus booster: Not documented    Pneumococcal vaccine: Not documented    H. zoster vaccine: Not documented  Colorectal Screening   Hemoccult: Not documented    Colonoscopy: Not documented  Other Screening   Pap smear: Not documented    Mammogram: Not documented    DXA bone density scan: Not documented   Smoking status: never  (05/30/2008)  Lipids   Total Cholesterol: Not documented   LDL: Not documented   LDL Direct: Not documented   HDL: Not documented   Triglycerides: Not documented    Echocardiogram Report  Procedure date:  07/16/2008  Findings:      [facility] -  Left ventricular size was at the upper limits of normal. Overall       left ventricular systolic function was moderately decreased.       Left ventricular ejection fraction was estimated , range       being 35 % to 40 %. There was hypokinesis of the mid-distal       lateral wall. There was hypokinesis of the periapical wall.       There was akinesis of the inferior wall. There was       hypokinesis of the posterior wall. Left ventricular wall       thickness was mildly increased. There was dyssynergic motion       of the interventricular septum, consistent with a conduction       abnormality or paced  rhythm. -  The aortic valve was mildly calcified. There was trivial aortic       valvular regurgitation. -  The left atrium was mild to moderately dilated.  Comments:      EF 35-40%

## 2010-05-13 NOTE — Progress Notes (Signed)
Summary: pt needs refill  Phone Note Refill Request Call back at Home Phone [phone] Message from:  [facility]  Refills Requested: Medication #1:  TORSEMIDE 20 MG TABS 1 two times a day Initial call taken by: Omer Jack,  January 18, 2010 10:26 AM    Prescriptions: TORSEMIDE 20 MG TABS (TORSEMIDE) 1 two times a day  #60 x 11   Entered by:   Kem Parkinson   Authorized by:   Colon Branch, MD, [facility]   Signed by:   Kem Parkinson on 01/18/2010   Method used:   Electronically to        [facility]* (retail)       [address]       [place], [place]  [address]       Ph: [phone]       Fax: [phone]   RxID:   [id number]

## 2010-05-13 NOTE — Progress Notes (Signed)
Summary: PRE MEDS [facility] DENTAL WORK  Phone Note Call from Patient Call back at [facility] Phone [phone]   Caller: Patient Reason for Call: Talk to Nurse Complaint: Headache Summary of Call: IS IT O.K. FOR PT TO HAVE TEETH CLEAN, ALSO PRE-MEDS. Initial call taken by: Lorne Skeens,  Aug 15, 2008 12:11 PM  Follow-up for Phone Call        SPOKE WITH PT NEED TO ASK MD RE ; IF NEEDS ANTIBIOTIC THERAPY PRIOR  TO CLEANING  WILL TRY TO GET ANSWER TODAY AND NOTIFY PT BEFORE END OF DAY HAS APPT ON MONDAY AT 10:OO Scherrie Bateman, LPN  Aug 15, 4780 12:56 PM SPOKE WITH PT UNABLE TO GET DIRECTION FROM DR Eden Emms  PT AWRE MD OUT OF OFF NEXT WEEK  PT OKAY WITH GETTING ADDRESSED FOLLOWING WEEK  PER PT WILL CX DENTSIT APPT FOR MON 08/1008 Scherrie Bateman, LPN  Aug 15, 9560 5:21 PM    I      Appended Document: PRE MEDS [facility] DENTAL WORK Does not need SBE prophylaxis.    Appended Document: PRE MEDS [facility] WORK PT AWARE DOES NOT REQUIRE SBE PER DR NISHAN. CY

## 2010-05-14 ENCOUNTER — Encounter: Payer: Self-pay | Admitting: Family Medicine

## 2010-05-18 ENCOUNTER — Encounter (INDEPENDENT_AMBULATORY_CARE_PROVIDER_SITE_OTHER): Payer: Self-pay | Admitting: *Deleted

## 2010-05-18 ENCOUNTER — Other Ambulatory Visit: Payer: BC Managed Care – PPO

## 2010-05-18 ENCOUNTER — Other Ambulatory Visit: Payer: Self-pay | Admitting: Family Medicine

## 2010-05-18 DIAGNOSIS — Z1289 Encounter for screening for malignant neoplasm of other sites: Secondary | ICD-10-CM

## 2010-05-18 LAB — FECAL OCCULT BLOOD, IMMUNOCHEMICAL: Fecal Occult Bld: NEGATIVE

## 2010-05-19 ENCOUNTER — Encounter: Payer: Self-pay | Admitting: Family Medicine

## 2010-05-19 ENCOUNTER — Encounter (INDEPENDENT_AMBULATORY_CARE_PROVIDER_SITE_OTHER): Payer: Self-pay | Admitting: *Deleted

## 2010-05-19 NOTE — Progress Notes (Signed)
----   Converted from flag ---- ---- 05/10/2010 10:29 PM, Crawford Givens MD wrote: cmet/lipid/TSH 427.31.   ---- 05/10/2010 4:34 PM, Melody Comas wrote: Patient is coming in tomorrow for cpx labs tomorrow. What labs to draw and diagnosis please. ------------------------------

## 2010-05-19 NOTE — Miscellaneous (Signed)
  Clinical Lists Changes       Bone Density  Procedure date:  06/25/2001  Comments:      Assessment:  Osteopenia.

## 2010-05-19 NOTE — Assessment & Plan Note (Signed)
Summary: MEDICARE CPX/CLE  PT WILL PICK UP MEDICAR FORM   Vital Signs:  Patient profile:   77 year old female Height:      65 inches Weight:      165.75 pounds BMI:     27.68 Temp:     98.2 degrees F oral Pulse rate:   68 / minute Pulse rhythm:   regular BP sitting:   110 / 70  (left arm) Cuff size:   regular  Vitals Entered By: Delilah Shan CMA Antasia Haider Dull) (May 13, 2010 11:30 AM) CC: Medicare CPX, Preventive Care  Vision Screening:Left eye with correction: 20 / 40 Right eye with correction: 20 / 40 Both eyes with correction: 20 / 40        Vision Entered By: Delilah Shan CMA Rayelynn Loyal Dull) (May 13, 2010 11:31 AM)  Hearing Screen 25db HL: Left  500 hz: No Response 1000 hz: 25db 2000 hz: 25db 4000 hz: No Response Right  500 hz: No Response 1000 hz: No Response 2000 hz: 25db 4000 hz: No Response    History of Present Illness: I have personally reviewed the Medicare Annual Wellness questionnaire and have noted 1.   The patient's medical and social history 2.   Their use of alcohol, tobacco or illicit drugs 3.   Their current medications and supplements 4.   The patient's functional ability including ADL's, fall risks, home safety risks and hearing or visual             impairment. 5.   Diet and physical activities 6.   Evidence for depression or mood disorders  The patients weight, height, BMI and visual acuity have been recorded in the chart I have made referrals, counseling and provided education to the patient based review of the above and I have provided the pt with a written personalized care plan for preventive services.  L knee pain is improving.  Occ popping but no weakness or instability.  "90% well" per patient.   Insomina- patient can substitute benadryl instead of ambien. She has weaned off the [place].  Intermittent symptoms.   Has living will.  Husband is the HCPOA.  Full code.    AF.  No CP, bilateral lower extremity edema.  not short of breath.     Preventive Screening-Counseling & Management  Alcohol-Tobacco     Smoking Status: never  Allergies: 1)  ! Coumadin 2)  ! Ace Inhibitors  Past History:  Past Surgical History: Last updated: 03/18/10 s/p hysterectomy 1979 (ovaries left intact)  Family History: Last updated: 03-18-10 Father:decease age 39 ca Mother:decease age 46 TB  Social History: Last updated: 05/13/2010 Retired, former [employer] Married, 1953 Lives with husband 2 grown children, both in [place] Tobacco Use - No.  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 Married, 1953 Lives with husband 2 grown children, both in [place] Tobacco Use - No.  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  Otherwise negative.    Physical Exam  General:  GEN: nad, alert and oriented HEENT: mucous membranes moist NECK: supple w/o LA CV: occ ectopy noted but not tachy PULM: ctab, no inc wob ABD: soft, +bs EXT: no edema SKIN: no acute rash  L knee w/o erythema/bruising.  Normal range of motion but crepitus noted at patella.  ACL/MCL/LCL intact on testing.  She is tender on the lateral joint line and inferiorly.    Impression & Recommendations:  Problem # 1:  Preventive Health Care (ICD-V70.0) Tetanus today.  PNA and zosta done 4 years ago per patient. DXA referral done.  Will check old records about colonsocopy with GI in  ~2005.  IFOB sent with patient today. Pt to have  mammogram in spring of 2012.  No indication for pap since she is s/p hysterectomy.  Referral for hearing eval declined.  See scanned forms.    Problem # 2:  ATRIAL FIBRILLATION (ICD-427.31) No change in meds.  Sx controlled.  Her updated medication list for this problem includes:    Metoprolol Succinate 25 Mg Xr24h-tab (Metoprolol succinate) .[name]... Take one tablet by mouth daily    Aspirin 81 Mg Tabs (Aspirin) .[name]... 1 tab by mouth once daily  Problem # 3:  KNEE PAIN, LEFT (ICD-719.46) Improving.  See instructions.   Her updated medication list for this problem includes:    Aspirin 81 Mg Tabs (Aspirin) .[name]... 1 tab by mouth once daily    Ibuprofen 200 Mg Tabs (Ibuprofen) .[name]... Take 2 by mouth daily  Problem # 4:  SPECIAL SCREENING FOR OSTEOPOROSIS (ICD-V82.81) refer for dxa  Orders: Radiology Referral (Radiology)  Complete Medication List: 1)  Metoprolol Succinate 25 Mg Xr24h-tab (Metoprolol succinate) .... Take one tablet by mouth daily 2)  Torsemide 20 Mg Tabs (Torsemide) .[name].. 1 two times a day 3)  Multivitamins Tabs (Multiple vitamin) .[name].. 1 tab by mouth once daily 4)  Aspirin 81 Mg Tabs (Aspirin) .[name].. 1 tab by mouth once daily 5)  Ranitidine Hcl 150 Mg Caps (Ranitidine hcl) .... 1/2 tab by mouth two times a day as needed 6)  Ibuprofen 200 Mg Tabs (Ibuprofen) .... Take 2 by mouth daily  Other Orders: Audiometry [phone]) Vision Screening ([address]) Medicare -1st Annual Wellness Visit [id number]) TD Toxoids IM 7 YR + [phone]) Admin 1st Vaccine (29562)  Colorectal Screening:  Current Recommendations:    Hemoccult: Given x 3  PAP Screening:    Reviewed PAP smear recommendations:  PAP smear not indicated at this time  Osteoporosis Risk Assessment:  Risk Factors for Fracture or Low Bone Density:   Race (White or Asian):     yes   Smoking status:       never  Immunization & Chemoprophylaxis:    Tetanus vaccine: Td  (05/13/2010)    Influenza vaccine: Historical  (12/10/2009)     Pneumovax: Historical  (04/11/2006)  Patient Instructions: 1)  Try to increase your calcium intake. 2)  See Shirlee Limerick about your referral before your leave today to set up your bone density scan.  3)  Let me know if you have concerns in the meantime.  If your knee pain gets worse, we may need to get you to see Dr. Patsy Lager.  4)  I would like to see you back in 6 months to check your blood pressure and heart.  Take care.    Orders Added: 1)  Audiometry [92552] 2)  Vision Screening [13086] 3)  Medicare -1st Annual Wellness Visit [G0438] 4)  Est. Patient Level III [[address]] 5)  Radiology Referral [Radiology] 6)  TD Toxoids IM 7 YR + [90714] 7)  Admin 1st Vaccine [90471]   Immunization History:  Pneumovax Immunization History:    Pneumovax:  historical (04/11/2006)  Zostavax History:    Zostavax # 1:  zostavax (04/11/2006)  Immunizations Administered:  Tetanus Vaccine:    Vaccine Type: Td    Site: left deltoid    Mfr: Sanofi Pasteur    Dose: 0.5 ml    Route: IM    Given by: Delilah Shan CMA (AAMA)    Exp. Date: 07/29/2011    Lot #: [id number]    VIS given: 02/27/08 version given May 13, 2010.   Immunization History:  Pneumovax Immunization History:    Pneumovax:  Historical (04/11/2006)  Zostavax History:    Zostavax # 1:  Zostavax (04/11/2006)  Immunizations Administered:  Tetanus Vaccine:    Vaccine Type: Td    Site: left deltoid    Mfr: Sanofi Pasteur    Dose: 0.5 ml    Route: IM    Given by: Delilah Shan CMA (AAMA)    Exp. Date: 07/29/2011    Lot #: [id number]    VIS given: 02/27/08 version given May 13, 2010.  Current Allergies (reviewed today): ! COUMADIN ! ACE INHIBITORS

## 2010-05-19 NOTE — Letter (Signed)
Summary: [facility]: Immunoassay Fecal Occult Blood (iFOB) Order Form  [name] at [facility]  [address] [place], [place] [address]   Phone: [phone]  Fax: [phone]      [facility]: Immunoassay Fecal Occult Blood (iFOB) Order Form   May 13, 2010 MRN: [id number]   [name] Jun 11, 1930   Physicican Name:________duncan_________________  Diagnosis Code:_________v76.49_________________      Crawford Givens MD

## 2010-05-20 ENCOUNTER — Encounter: Payer: Self-pay | Admitting: Family Medicine

## 2010-05-20 ENCOUNTER — Telehealth: Payer: Self-pay | Admitting: Family Medicine

## 2010-05-23 DIAGNOSIS — M810 Age-related osteoporosis without current pathological fracture: Secondary | ICD-10-CM | POA: Insufficient documentation

## 2010-05-24 ENCOUNTER — Encounter (INDEPENDENT_AMBULATORY_CARE_PROVIDER_SITE_OTHER): Payer: Self-pay | Admitting: *Deleted

## 2010-05-24 ENCOUNTER — Encounter: Payer: Self-pay | Admitting: Family Medicine

## 2010-05-27 NOTE — Miscellaneous (Signed)
  Clinical Lists Changes  Observations: Added new observation of COLONOSCOPY: diverticulosis in descending and sigmoid colon, done by Dr. Jarold Motto (06/14/2004 11:51)      Colonoscopy  Procedure date:  06/14/2004  Findings:      diverticulosis in descending and sigmoid colon, done by Dr. Jarold Motto

## 2010-05-27 NOTE — Letter (Signed)
Summary: Results Follow up Letter  [place] [facility]  [address] [place], [place] [address]   Phone: [phone]  Fax: [phone]    05/19/2010 MRN: [id number]    Tristian Stallings [address] [place], [place]  [address]  [place]    Dear Ms. Mellette,  The following are the results of your recent test(s):  Test         Result    Pap Smear:        Normal _____  Not Normal _____ Comments: ______________________________________________________ Cholesterol: LDL(Bad cholesterol):         Your goal is less than:         HDL (Good cholesterol):       Your goal is more than: Comments:  ______________________________________________________ Mammogram:        Normal _____  Not Normal _____ Comments:  ___________________________________________________________________ Hemoccult:        Normal ___X__  Not normal _______ Comments:    Yearly follow up is recommended.   _____________________________________________________________________ Other Tests:    We routinely do not discuss normal results over the telephone.  If you desire a copy of the results, or you have any questions about this information we can discuss them at your next office visit.   Sincerely,    Dwana Curd. Para March, M.D.  [facility]

## 2010-05-27 NOTE — Letter (Signed)
Summary: Nature conservation officer [employer] Wellness Visit Questionnaire   [employer] Annual Wellness Visit Questionnaire   Imported By: Beau Fanny 05/17/2010 16:50:05  _____________________________________________________________________  External Attachment:    Type:   Image     Comment:   External Document

## 2010-05-27 NOTE — Progress Notes (Signed)
Summary: Repeat colonoscopy  Phone Note Outgoing Call   Call placed by: Delilah Shan CMA Duncan Dull),  May 20, 2010 2:52 PM Call placed to: Dr. Norval Gable office Summary of Call: You asked me to phone GI about a follow up colonscopy on Ms. Galindez.  The last examination was done 06/14/2004 and she was due again in 5 years, which should have been 2011.  The receptionist in GI said she was mailed a letter in March of last year but never contacted them to set up an appointment. Initial call taken by: Delilah Shan CMA Duncan Dull),  May 20, 2010 2:54 PM  Follow-up for Phone Call        Please have patient call about setting up the follow up. Crawford Givens MD  May 20, 2010 5:19 PM   Left message on voicemail  to return call. Delilah Shan CMA Duncan Dull)  May 20, 2010 6:05 PM   Patient Advised.  Follow-up by: Delilah Shan CMA Duncan Dull),  May 21, 2010 8:41 AM

## 2010-06-01 ENCOUNTER — Ambulatory Visit (INDEPENDENT_AMBULATORY_CARE_PROVIDER_SITE_OTHER): Payer: Medicare Other | Admitting: Family Medicine

## 2010-06-01 ENCOUNTER — Encounter: Payer: Self-pay | Admitting: Family Medicine

## 2010-06-01 DIAGNOSIS — N390 Urinary tract infection, site not specified: Secondary | ICD-10-CM

## 2010-06-01 DIAGNOSIS — M810 Age-related osteoporosis without current pathological fracture: Secondary | ICD-10-CM

## 2010-06-01 LAB — CONVERTED CEMR LAB
Bilirubin Urine: NEGATIVE
Glucose, Urine, Semiquant: NEGATIVE
Ketones, urine, test strip: NEGATIVE
Nitrite: POSITIVE
Specific Gravity, Urine: 1.01
Urobilinogen, UA: 0.2
pH: 6

## 2010-06-02 LAB — CONVERTED CEMR LAB: Vit D, 25-Hydroxy: 42 ng/mL (ref 30–89)

## 2010-06-02 NOTE — Miscellaneous (Signed)
  Clinical Lists Changes  Observations: Added new observation of DEXANXTDUE: 05/2013 (05/24/2010 12:36) Added new observation of BONE DENSITY: osteoporosis (05/19/2010 12:38)      Preventive Care Screening  Bone Density:    Date:  05/19/2010    Next Due:  05/2013    Results:  osteoporosis     Osteoporosis

## 2010-06-02 NOTE — Letter (Signed)
Summary: Pre Visit Letter Revised  [facility]  [address] [place], [place] [address]   Phone: [phone]  Fax: [phone]        05/24/2010 MRN: [id number] [name] [address] [place], [place]  [address]  [place]             Procedure Date: 06/30/2010 @ 8:30   Direct colon-Dr. Jarold Motto   Welcome to the Gastroenterology Division at [facility].    You are scheduled to see a nurse for your pre-procedure visit on 06/16/2010 at 8:30 on the 3rd floor at [facility], [address]  We ask that you try to arrive at our office 15 minutes prior to your appointment time to allow for check-in.  Please take a minute to review the attached form.  If you answer "Yes" to one or more of the questions on the first page, we ask that you call the person listed at your earliest opportunity.  If you answer "No" to all of the questions, please complete the rest of the form and bring it to your appointment.    Your nurse visit will consist of discussing your medical and surgical history, your immediate family medical history, and your medications.   If you are unable to list all of your medications on the form, please bring the medication bottles to your appointment and we will list them.  We will need to be aware of both prescribed and over the counter drugs.  We will need to know exact dosage information as well.    Please be prepared to read and sign documents such as consent forms, a financial agreement, and acknowledgement forms.  If necessary, and with your consent, a friend or relative is welcome to sit-in on the nurse visit with you.  Please bring your insurance card so that we may make a copy of it.  If your insurance requires a referral to see a specialist, please bring your referral form from your primary care physician.  No co-pay is required for this nurse visit.     If you cannot keep your appointment, please call [phone] to cancel or reschedule prior  to your appointment date.  This allows [place] the opportunity to schedule an appointment for another patient in need of care.    Thank you for choosing [facility] for your medical needs.  We appreciate the opportunity to care for you.  Please visit [place] at our website  to learn more about our practice.  Sincerely, The Gastroenterology Division

## 2010-06-03 ENCOUNTER — Ambulatory Visit: Payer: BC Managed Care – PPO | Admitting: Family Medicine

## 2010-06-07 ENCOUNTER — Other Ambulatory Visit: Payer: Self-pay | Admitting: Physician Assistant

## 2010-06-07 ENCOUNTER — Encounter: Payer: Self-pay | Admitting: Physician Assistant

## 2010-06-07 ENCOUNTER — Ambulatory Visit (INDEPENDENT_AMBULATORY_CARE_PROVIDER_SITE_OTHER): Payer: Medicare Other | Admitting: Physician Assistant

## 2010-06-07 DIAGNOSIS — I5022 Chronic systolic (congestive) heart failure: Secondary | ICD-10-CM

## 2010-06-07 DIAGNOSIS — R609 Edema, unspecified: Secondary | ICD-10-CM

## 2010-06-07 DIAGNOSIS — I428 Other cardiomyopathies: Secondary | ICD-10-CM

## 2010-06-07 LAB — BRAIN NATRIURETIC PEPTIDE: Pro B Natriuretic peptide (BNP): 66.1 pg/mL (ref 0.0–100.0)

## 2010-06-08 NOTE — Procedures (Signed)
Summary: Colonoscopy/[facility]  Colonoscopy/[facility]   Imported By: Lanelle Bal 05/31/2010 15:15:15  _____________________________________________________________________  External Attachment:    Type:   Image     Comment:   External Document

## 2010-06-08 NOTE — Assessment & Plan Note (Signed)
Summary: F/U ON BONE DENSITY. PT HAS UTI... CYD   Vital Signs:  Patient profile:   77 year old female Height:      65 inches Weight:      166.25 pounds BMI:     27.77 Temp:     98.4 degrees F oral Pulse rate:   76 / minute Pulse rhythm:   regular BP sitting:   122 / 76  (left arm) Cuff size:   regular  Vitals Entered By: Delilah Shan CMA Cherish Runde Dull) (June 01, 2010 11:18 AM) CC: F/U on bone density test.   2.  ? UTI   History of Present Illness: dysuria:yes duration of symptoms:10days abdominal pain:no fevers:no back pain:no vomiting:no other concerns: see below re:DXA.   Osteoporosis. >1inch loss in height.  DXA reviewed with patient.  She isn't on calcium/vit D consistently.    Allergies: 1)  ! Coumadin 2)  ! Ace Inhibitors 3)  ! Septra  Past History:  Past Medical History: Last updated: 05/23/2010 Atrial Fibrillation- h/o sig bleed on coumadin GERD h/o R sciatica insomnia- off chronic ambien 5mg  as of 10/11, rare/episodic use since then DCM/CHF h/o depression- prev on zoloft  Osteoporosis on DXA 05/2010  Review of Systems       See HPI.  Otherwise negative.    Physical Exam  General:  no apparent distress normocephalic atraumatic mucous membranes moist regular rate and rhythm but with ectopy noted, not tachy clear to auscultation bilaterally no cva pain. not tender to palpation in suprapubic area ext w/o edema   Impression & Recommendations:  Problem # 1:  OSTEOPOROSIS (ICD-733.00) >25 min spent with patient, at least half of which was spent on counseling AO:ZHYQ.  Will check vit D and address if low.  When it gets back to normal, we can start fosamax 70mg  a week.  She is aware of possible GI side effect and need for adherence to routine when using the med- specifically staying upright when taking the medicine.  Even with h/o GERD, I think she may be able to tolerate the fosamax.  As long as she isn't having an increase in symptoms of GERD, it would  be okay to continue.  She is aware of potential risk for atypical long bone fractures wih this medicine.  Continue weight bearing exercise.   Orders: T-Vitamin D (25-Hydroxy) [phone])  Problem # 2:  UTI (ICD-599.0) Start cipro and check ucx.  See notes on labs.   Orders: Specimen Handling (52841) T-Culture, Urine ([id number]) Prescription Created Electronically [phone])  Her updated medication list for this problem includes:    Cipro 250 Mg Tabs (Ciprofloxacin hcl) .[name]... 1 by mouth two times a day  Complete Medication List: 1)  Metoprolol Succinate 25 Mg Xr24h-tab (Metoprolol succinate) .... Take one tablet by mouth daily 2)  Torsemide 20 Mg Tabs (Torsemide) .[name].. 1 two times a day 3)  Multivitamins Tabs (Multiple vitamin) .[name].. 1 tab by mouth once daily 4)  Aspirin 81 Mg Tabs (Aspirin) .[name].. 1 tab by mouth once daily 5)  Ranitidine Hcl 150 Mg Caps (Ranitidine hcl) .... 1/2 tab by mouth two times a day as needed 6)  Ibuprofen 200 Mg Tabs (Ibuprofen) .... Take 2 by mouth daily 7)  Cipro 250 Mg Tabs (Ciprofloxacin hcl) .[name].. 1 by mouth two times a day  Other Orders: UA Dipstick w/o Micro (manual) (40347)  Patient Instructions: 1)  We'll contact you with your lab report.  2)  Start taking 1000 units of calcium and vitamin D  a day.   3)  We can address the fosamax after I get your tests back.   Prescriptions: CIPRO 250 MG TABS (CIPROFLOXACIN HCL) 1 by mouth two times a day  #6 x 0   Entered and Authorized by:   Crawford Givens MD   Signed by:   Crawford Givens MD on 06/01/2010   Method used:   Electronically to        [facility]* (retail)       [address]       [place], [place]  [address]       Ph: [phone]       Fax: [phone]   RxID:   [id number]    Orders Added: 1)  UA Dipstick w/o Micro (manual) [81002] 2)  Specimen Handling [99000] 3)  T-Culture, Urine [[id number]] 4)  T-Vitamin D (25-Hydroxy) [66440-34742] 5)  Est. Patient Level IV [59563] 6)   Prescription Created Electronically [[id number]]    Current Allergies (reviewed today): ! COUMADIN ! ACE INHIBITORS ! SEPTRA  Laboratory Results   Urine Tests  Date/Time Received: June 01, 2010 11:19 AM   Routine Urinalysis   Color: yellow Appearance: Cloudy Glucose: negative   (Normal Range: Negative) Bilirubin: negative   (Normal Range: Negative) Ketone: negative   (Normal Range: Negative) Spec. Gravity: 1.010   (Normal Range: 1.003-1.035) Blood: moderate   (Normal Range: Negative) pH: 6.0   (Normal Range: 5.0-8.0) Protein: trace   (Normal Range: Negative) Urobilinogen: 0.2   (Normal Range: 0-1) Nitrite: positive   (Normal Range: Negative) Leukocyte Esterace: large   (Normal Range: Negative)

## 2010-06-14 ENCOUNTER — Telehealth: Payer: Self-pay | Admitting: Gastroenterology

## 2010-06-17 NOTE — Assessment & Plan Note (Signed)
Summary: weight gain   Visit Type:  Follow-up  CC:  problems with fluids.  History of Present Illness: Primary Cardiologist:  Dr. Charlton Haws  [name] is a 77 yo female with a h/o parox. AFib, NICM, LBBB, h/o spontaneous retroperitoneal bleed on coumadin.  Last echo done in 07/2008 demonstrated an EF 35-40%, lat and post HK, inf. AK, mild-mod LAE.  Cath in 04/2008 demonstrated no CAD and EF 20%.  Prior notes document her dry weight at 156 lbs.   She has had several weeks where she's noted increased weight.  She was concerned last week when her weight went up 2 pounds in 1 day.  She took an extra torsemide that day.  Her weight came down.  She denies any increased shortness of breath.  She denies orthopnea, PND or pedal edema.  She denies chest pain.  She describes NYHA class II symptoms.  She does note being tired at times.  She denies palpitations.  She denies significant cough.  She feels that her abdominal girth is increased.  This is just mild.  Overall, she thinks that she's probably changed her diet somewhat which would account for her increased weight.  She was concerned and decided to make an appointment.  Current Medications (verified): 1)  Metoprolol Succinate 25 Mg Xr24h-Tab (Metoprolol Succinate) .... Take One Tablet By Mouth Daily 2)  Torsemide 20 Mg Tabs (Torsemide) .[name].. 1 Two Times A Day 3)  Multivitamins   Tabs (Multiple Vitamin) .[name].. 1 Tab By Mouth Once Daily 4)  Aspirin 81 Mg  Tabs (Aspirin) .[name].. 1 Tab By Mouth Once Daily 5)  Ranitidine Hcl 150 Mg Caps (Ranitidine Hcl) .... 1/2 Tab By Mouth Two Times A Day As Needed 6)  Ibuprofen 200 Mg Tabs (Ibuprofen) .... Take 2 By Mouth Daily 7)  Vitamin D3 1000 Unit Tabs (Cholecalciferol) .... Take One Daily 8)  Calcium 600 With Vitamin D 500 .... Take One Daily  Allergies (verified): 1)  ! Coumadin 2)  ! Ace Inhibitors 3)  ! Septra  Past History:  Past Medical History: Last updated: 05/23/2010 Atrial Fibrillation- h/o sig  bleed on coumadin GERD h/o R sciatica insomnia- off chronic ambien 5mg  as of 10/11, rare/episodic use since then DCM/CHF h/o depression- prev on zoloft  Osteoporosis on DXA 05/2010  Review of Systems       She was recently treated for a UTI.  Otherwise, as per  the HPI.  All other systems reviewed and negative.   Vital Signs:  Patient profile:   77 year old female Height:      65 inches Weight:      165 pounds Pulse rate:   76 / minute Pulse rhythm:   regular BP sitting:   112 / 72  (right arm)  Vitals Entered By: Jacquelin Hawking, CMA (June 07, 2010 10:30 AM)  Physical Exam  General:  Well nourished, well developed, in no acute distress HEENT: normal Neck: no JVD Cardiac:  normal S1, S2; RRR; no murmur Lungs:  clear to auscultation bilaterally, no wheezing, rhonchi or rales Abd: soft, nontender, no hepatomegaly Ext: no edema Skin: warm and dry Neuro:  CNs 2-12 intact, no focal abnormalities noted Endo: no thyromegaly   Impression & Recommendations:  Problem # 1:  CARDIOMYOPATHY, DILATED (ICD-425.4) Volume appears stable right now.  I suspect her weight changes are related to a change in diet only.  Symptomatically, she does not sound as though she is in acute decompensated CHF. I recommend she have  a BNP.  If this is ok, no change in diuretics.  If elevated, increase diuretics. Otherwise, f/u with [name] in April with an Echo.  Other Orders: TLB-BNP (B-Natriuretic Peptide) ([id number])  Patient Instructions: 1)  Your physician recommends that you schedule a follow-up appointment in: April with [name] as per [name], PA-C. 2)  Your physician has requested that you have an echocardiogram the same day as your appt. with [name]. Echocardiography is a painless test that uses sound waves to create images of your heart. It provides your doctor with information about the size and shape of your heart and how well your heart's chambers and valves are working.   This procedure takes approximately one hour. There are no restrictions for this procedure. 3)  Your physician recommends that you return for lab work in: Today BNP 782.3 4)  Your physician recommends that you continue on your current medications as directed. Please refer to the Current Medication list given to you today.

## 2010-06-22 NOTE — Progress Notes (Signed)
  Phone Note Outgoing Call   Call placed by: Karl Bales RN,  June 14, 2010 12:40 PM Call placed to: Patient Summary of Call: Writer preparing chart for pt's PV on Wednesday and noticed that Dr. Jarold Motto does not recommend a follow up exam on recall assessment form. Verified with Harlow Mares, and she agrees.  Pt notified and understanding voiced.  Pt told to call office back if she has any other GI needs or concerns. Initial call taken by: Karl Bales RN,  June 14, 2010 12:43 PM

## 2010-06-30 ENCOUNTER — Other Ambulatory Visit: Payer: BC Managed Care – PPO | Admitting: Gastroenterology

## 2010-07-23 ENCOUNTER — Encounter: Payer: Self-pay | Admitting: Cardiovascular Disease

## 2010-07-26 ENCOUNTER — Other Ambulatory Visit (HOSPITAL_COMMUNITY): Payer: Self-pay | Admitting: Cardiovascular Disease

## 2010-07-26 DIAGNOSIS — I509 Heart failure, unspecified: Secondary | ICD-10-CM

## 2010-07-26 LAB — COMPREHENSIVE METABOLIC PANEL
ALT: 11 U/L (ref 0–35)
AST: 22 U/L (ref 0–37)
Albumin: 2.8 g/dL — ABNORMAL LOW (ref 3.5–5.2)
Alkaline Phosphatase: 65 U/L (ref 39–117)
BUN: 8 mg/dL (ref 6–23)
CO2: 23 mEq/L (ref 19–32)
Calcium: 7.5 mg/dL — ABNORMAL LOW (ref 8.4–10.5)
Chloride: 109 mEq/L (ref 96–112)
Creatinine, Ser: 0.73 mg/dL (ref 0.4–1.2)
GFR calc Af Amer: >60 mL/min (ref >60)
GFR calc non Af Amer: >60 mL/min (ref >60)
Glucose, Bld: 109 mg/dL — ABNORMAL HIGH (ref 70–99)
Potassium: 3.8 mEq/L (ref 3.5–5.1)
Sodium: 136 mEq/L (ref 135–145)
Total Bilirubin: 0.7 mg/dL (ref 0.3–1.2)
Total Protein: 5.1 g/dL — ABNORMAL LOW (ref 6.0–8.3)

## 2010-07-26 LAB — LIPID PANEL
Cholesterol: 131 mg/dL (ref 0–200)
HDL: 61 mg/dL (ref >39)
LDL Cholesterol: 56 mg/dL (ref 0–99)
Total CHOL/HDL Ratio: 2.1 RATIO
Triglycerides: 70 mg/dL (ref <150)
VLDL: 14 mg/dL (ref 0–40)

## 2010-07-26 LAB — HEPARIN LEVEL (UNFRACTIONATED)
Heparin Unfractionated: 0.12 IU/mL — ABNORMAL LOW (ref 0.30–0.70)
Heparin Unfractionated: 0.15 IU/mL — ABNORMAL LOW (ref 0.30–0.70)
Heparin Unfractionated: 0.22 IU/mL — ABNORMAL LOW (ref 0.30–0.70)
Heparin Unfractionated: 0.25 IU/mL — ABNORMAL LOW (ref 0.30–0.70)
Heparin Unfractionated: 0.25 IU/mL — ABNORMAL LOW (ref 0.30–0.70)
Heparin Unfractionated: 0.29 IU/mL — ABNORMAL LOW (ref 0.30–0.70)
Heparin Unfractionated: 0.31 IU/mL (ref 0.30–0.70)
Heparin Unfractionated: 0.32 IU/mL (ref 0.30–0.70)
Heparin Unfractionated: 0.33 IU/mL (ref 0.30–0.70)
Heparin Unfractionated: 0.36 IU/mL (ref 0.30–0.70)
Heparin Unfractionated: 0.38 IU/mL (ref 0.30–0.70)
Heparin Unfractionated: 0.41 IU/mL (ref 0.30–0.70)
Heparin Unfractionated: 0.61 IU/mL (ref 0.30–0.70)
Heparin Unfractionated: 0.62 IU/mL (ref 0.30–0.70)
Heparin Unfractionated: 0.66 IU/mL (ref 0.30–0.70)

## 2010-07-26 LAB — BASIC METABOLIC PANEL
BUN: 10 mg/dL (ref 6–23)
BUN: 10 mg/dL (ref 6–23)
BUN: 11 mg/dL (ref 6–23)
BUN: 12 mg/dL (ref 6–23)
BUN: 12 mg/dL (ref 6–23)
BUN: 23 mg/dL (ref 6–23)
BUN: 6 mg/dL (ref 6–23)
BUN: 8 mg/dL (ref 6–23)
BUN: 8 mg/dL (ref 6–23)
BUN: 8 mg/dL (ref 6–23)
BUN: 8 mg/dL (ref 6–23)
BUN: 9 mg/dL (ref 6–23)
BUN: 9 mg/dL (ref 6–23)
BUN: 9 mg/dL (ref 6–23)
CO2: 22 mEq/L (ref 19–32)
CO2: 23 mEq/L (ref 19–32)
CO2: 23 mEq/L (ref 19–32)
CO2: 25 mEq/L (ref 19–32)
CO2: 25 mEq/L (ref 19–32)
CO2: 25 mEq/L (ref 19–32)
CO2: 25 mEq/L (ref 19–32)
CO2: 27 mEq/L (ref 19–32)
CO2: 28 mEq/L (ref 19–32)
CO2: 28 mEq/L (ref 19–32)
CO2: 28 mEq/L (ref 19–32)
CO2: 28 mEq/L (ref 19–32)
CO2: 29 mEq/L (ref 19–32)
CO2: 29 mEq/L (ref 19–32)
Calcium: 8.1 mg/dL — ABNORMAL LOW (ref 8.4–10.5)
Calcium: 8.1 mg/dL — ABNORMAL LOW (ref 8.4–10.5)
Calcium: 8.2 mg/dL — ABNORMAL LOW (ref 8.4–10.5)
Calcium: 8.2 mg/dL — ABNORMAL LOW (ref 8.4–10.5)
Calcium: 8.3 mg/dL — ABNORMAL LOW (ref 8.4–10.5)
Calcium: 8.3 mg/dL — ABNORMAL LOW (ref 8.4–10.5)
Calcium: 8.3 mg/dL — ABNORMAL LOW (ref 8.4–10.5)
Calcium: 8.4 mg/dL (ref 8.4–10.5)
Calcium: 8.5 mg/dL (ref 8.4–10.5)
Calcium: 8.6 mg/dL (ref 8.4–10.5)
Calcium: 8.7 mg/dL (ref 8.4–10.5)
Calcium: 8.7 mg/dL (ref 8.4–10.5)
Calcium: 8.7 mg/dL (ref 8.4–10.5)
Calcium: 9 mg/dL (ref 8.4–10.5)
Chloride: 103 mEq/L (ref 96–112)
Chloride: 79 mEq/L — CL (ref 96–112)
Chloride: 81 mEq/L — ABNORMAL LOW (ref 96–112)
Chloride: 81 mEq/L — ABNORMAL LOW (ref 96–112)
Chloride: 82 mEq/L — ABNORMAL LOW (ref 96–112)
Chloride: 83 mEq/L — ABNORMAL LOW (ref 96–112)
Chloride: 84 mEq/L — ABNORMAL LOW (ref 96–112)
Chloride: 84 mEq/L — ABNORMAL LOW (ref 96–112)
Chloride: 85 mEq/L — ABNORMAL LOW (ref 96–112)
Chloride: 89 mEq/L — ABNORMAL LOW (ref 96–112)
Chloride: 90 mEq/L — ABNORMAL LOW (ref 96–112)
Chloride: 92 mEq/L — ABNORMAL LOW (ref 96–112)
Chloride: 94 mEq/L — ABNORMAL LOW (ref 96–112)
Chloride: 97 mEq/L (ref 96–112)
Creatinine, Ser: 0.55 mg/dL (ref 0.4–1.2)
Creatinine, Ser: 0.57 mg/dL (ref 0.4–1.2)
Creatinine, Ser: 0.59 mg/dL (ref 0.4–1.2)
Creatinine, Ser: 0.6 mg/dL (ref 0.4–1.2)
Creatinine, Ser: 0.61 mg/dL (ref 0.4–1.2)
Creatinine, Ser: 0.64 mg/dL (ref 0.4–1.2)
Creatinine, Ser: 0.65 mg/dL (ref 0.4–1.2)
Creatinine, Ser: 0.66 mg/dL (ref 0.4–1.2)
Creatinine, Ser: 0.66 mg/dL (ref 0.4–1.2)
Creatinine, Ser: 0.66 mg/dL (ref 0.4–1.2)
Creatinine, Ser: 0.67 mg/dL (ref 0.4–1.2)
Creatinine, Ser: 0.73 mg/dL (ref 0.4–1.2)
Creatinine, Ser: 0.78 mg/dL (ref 0.4–1.2)
Creatinine, Ser: 1.13 mg/dL (ref 0.4–1.2)
GFR calc Af Amer: 57 mL/min — ABNORMAL LOW (ref >60)
GFR calc Af Amer: >60 mL/min (ref >60)
GFR calc Af Amer: >60 mL/min (ref >60)
GFR calc Af Amer: >60 mL/min (ref >60)
GFR calc Af Amer: >60 mL/min (ref >60)
GFR calc Af Amer: >60 mL/min (ref >60)
GFR calc Af Amer: >60 mL/min (ref >60)
GFR calc Af Amer: >60 mL/min (ref >60)
GFR calc Af Amer: >60 mL/min (ref >60)
GFR calc Af Amer: >60 mL/min (ref >60)
GFR calc Af Amer: >60 mL/min (ref >60)
GFR calc Af Amer: >60 mL/min (ref >60)
GFR calc Af Amer: >60 mL/min (ref >60)
GFR calc Af Amer: >60 mL/min (ref >60)
GFR calc non Af Amer: 47 mL/min — ABNORMAL LOW (ref >60)
GFR calc non Af Amer: >60 mL/min (ref >60)
GFR calc non Af Amer: >60 mL/min (ref >60)
GFR calc non Af Amer: >60 mL/min (ref >60)
GFR calc non Af Amer: >60 mL/min (ref >60)
GFR calc non Af Amer: >60 mL/min (ref >60)
GFR calc non Af Amer: >60 mL/min (ref >60)
GFR calc non Af Amer: >60 mL/min (ref >60)
GFR calc non Af Amer: >60 mL/min (ref >60)
GFR calc non Af Amer: >60 mL/min (ref >60)
GFR calc non Af Amer: >60 mL/min (ref >60)
GFR calc non Af Amer: >60 mL/min (ref >60)
GFR calc non Af Amer: >60 mL/min (ref >60)
GFR calc non Af Amer: >60 mL/min (ref >60)
Glucose, Bld: 104 mg/dL — ABNORMAL HIGH (ref 70–99)
Glucose, Bld: 109 mg/dL — ABNORMAL HIGH (ref 70–99)
Glucose, Bld: 110 mg/dL — ABNORMAL HIGH (ref 70–99)
Glucose, Bld: 110 mg/dL — ABNORMAL HIGH (ref 70–99)
Glucose, Bld: 111 mg/dL — ABNORMAL HIGH (ref 70–99)
Glucose, Bld: 112 mg/dL — ABNORMAL HIGH (ref 70–99)
Glucose, Bld: 120 mg/dL — ABNORMAL HIGH (ref 70–99)
Glucose, Bld: 123 mg/dL — ABNORMAL HIGH (ref 70–99)
Glucose, Bld: 134 mg/dL — ABNORMAL HIGH (ref 70–99)
Glucose, Bld: 159 mg/dL — ABNORMAL HIGH (ref 70–99)
Glucose, Bld: 80 mg/dL (ref 70–99)
Glucose, Bld: 89 mg/dL (ref 70–99)
Glucose, Bld: 91 mg/dL (ref 70–99)
Glucose, Bld: 99 mg/dL (ref 70–99)
Potassium: 3.7 mEq/L (ref 3.5–5.1)
Potassium: 3.7 mEq/L (ref 3.5–5.1)
Potassium: 3.8 mEq/L (ref 3.5–5.1)
Potassium: 3.8 mEq/L (ref 3.5–5.1)
Potassium: 3.9 mEq/L (ref 3.5–5.1)
Potassium: 4 mEq/L (ref 3.5–5.1)
Potassium: 4.1 mEq/L (ref 3.5–5.1)
Potassium: 4.1 mEq/L (ref 3.5–5.1)
Potassium: 4.2 mEq/L (ref 3.5–5.1)
Potassium: 4.2 mEq/L (ref 3.5–5.1)
Potassium: 4.3 mEq/L (ref 3.5–5.1)
Potassium: 4.3 mEq/L (ref 3.5–5.1)
Potassium: 4.6 mEq/L (ref 3.5–5.1)
Potassium: 5.2 mEq/L — ABNORMAL HIGH (ref 3.5–5.1)
Sodium: 115 mEq/L — CL (ref 135–145)
Sodium: 117 mEq/L — CL (ref 135–145)
Sodium: 118 mEq/L — CL (ref 135–145)
Sodium: 119 mEq/L — CL (ref 135–145)
Sodium: 119 mEq/L — CL (ref 135–145)
Sodium: 120 mEq/L — ABNORMAL LOW (ref 135–145)
Sodium: 121 mEq/L — ABNORMAL LOW (ref 135–145)
Sodium: 122 mEq/L — ABNORMAL LOW (ref 135–145)
Sodium: 123 mEq/L — ABNORMAL LOW (ref 135–145)
Sodium: 125 mEq/L — ABNORMAL LOW (ref 135–145)
Sodium: 127 mEq/L — ABNORMAL LOW (ref 135–145)
Sodium: 130 mEq/L — ABNORMAL LOW (ref 135–145)
Sodium: 134 mEq/L — ABNORMAL LOW (ref 135–145)
Sodium: 135 mEq/L (ref 135–145)

## 2010-07-26 LAB — OSMOLALITY
Osmolality: 238 mOsm/kg — ABNORMAL LOW (ref 275–300)
Osmolality: 250 mOsm/kg — ABNORMAL LOW (ref 275–300)

## 2010-07-26 LAB — CBC
HCT: 29.6 % — ABNORMAL LOW (ref 36.0–46.0)
HCT: 30.8 % — ABNORMAL LOW (ref 36.0–46.0)
HCT: 31.3 % — ABNORMAL LOW (ref 36.0–46.0)
HCT: 31.5 % — ABNORMAL LOW (ref 36.0–46.0)
HCT: 31.9 % — ABNORMAL LOW (ref 36.0–46.0)
HCT: 33.5 % — ABNORMAL LOW (ref 36.0–46.0)
HCT: 33.6 % — ABNORMAL LOW (ref 36.0–46.0)
HCT: 33.7 % — ABNORMAL LOW (ref 36.0–46.0)
HCT: 34 % — ABNORMAL LOW (ref 36.0–46.0)
HCT: 34.7 % — ABNORMAL LOW (ref 36.0–46.0)
HCT: 35.5 % — ABNORMAL LOW (ref 36.0–46.0)
HCT: 37.2 % (ref 36.0–46.0)
Hemoglobin: 10.4 g/dL — ABNORMAL LOW (ref 12.0–15.0)
Hemoglobin: 10.4 g/dL — ABNORMAL LOW (ref 12.0–15.0)
Hemoglobin: 10.5 g/dL — ABNORMAL LOW (ref 12.0–15.0)
Hemoglobin: 10.5 g/dL — ABNORMAL LOW (ref 12.0–15.0)
Hemoglobin: 10.8 g/dL — ABNORMAL LOW (ref 12.0–15.0)
Hemoglobin: 11.3 g/dL — ABNORMAL LOW (ref 12.0–15.0)
Hemoglobin: 11.3 g/dL — ABNORMAL LOW (ref 12.0–15.0)
Hemoglobin: 11.4 g/dL — ABNORMAL LOW (ref 12.0–15.0)
Hemoglobin: 11.5 g/dL — ABNORMAL LOW (ref 12.0–15.0)
Hemoglobin: 11.5 g/dL — ABNORMAL LOW (ref 12.0–15.0)
Hemoglobin: 12.2 g/dL (ref 12.0–15.0)
Hemoglobin: 12.3 g/dL (ref 12.0–15.0)
MCHC: 32.9 g/dL (ref 30.0–36.0)
MCHC: 33.1 g/dL (ref 30.0–36.0)
MCHC: 33.4 g/dL (ref 30.0–36.0)
MCHC: 33.5 g/dL (ref 30.0–36.0)
MCHC: 33.7 g/dL (ref 30.0–36.0)
MCHC: 33.7 g/dL (ref 30.0–36.0)
MCHC: 33.7 g/dL (ref 30.0–36.0)
MCHC: 33.7 g/dL (ref 30.0–36.0)
MCHC: 33.9 g/dL (ref 30.0–36.0)
MCHC: 34.1 g/dL (ref 30.0–36.0)
MCHC: 34.5 g/dL (ref 30.0–36.0)
MCHC: 35.1 g/dL (ref 30.0–36.0)
MCV: 89 fL (ref 78.0–100.0)
MCV: 89 fL (ref 78.0–100.0)
MCV: 89.5 fL (ref 78.0–100.0)
MCV: 90.2 fL (ref 78.0–100.0)
MCV: 90.3 fL (ref 78.0–100.0)
MCV: 90.4 fL (ref 78.0–100.0)
MCV: 90.6 fL (ref 78.0–100.0)
MCV: 90.6 fL (ref 78.0–100.0)
MCV: 90.7 fL (ref 78.0–100.0)
MCV: 90.9 fL (ref 78.0–100.0)
MCV: 91.4 fL (ref 78.0–100.0)
MCV: 94.3 fL (ref 78.0–100.0)
Platelets: 192 10*3/uL (ref 150–400)
Platelets: 219 10*3/uL (ref 150–400)
Platelets: 223 10*3/uL (ref 150–400)
Platelets: 232 10*3/uL (ref 150–400)
Platelets: 235 10*3/uL (ref 150–400)
Platelets: 245 10*3/uL (ref 150–400)
Platelets: 291 10*3/uL (ref 150–400)
Platelets: 292 10*3/uL (ref 150–400)
Platelets: 305 10*3/uL (ref 150–400)
Platelets: 311 10*3/uL (ref 150–400)
Platelets: 331 10*3/uL (ref 150–400)
Platelets: 370 10*3/uL (ref 150–400)
RBC: 3.33 MIL/uL — ABNORMAL LOW (ref 3.87–5.11)
RBC: 3.44 MIL/uL — ABNORMAL LOW (ref 3.87–5.11)
RBC: 3.45 MIL/uL — ABNORMAL LOW (ref 3.87–5.11)
RBC: 3.48 MIL/uL — ABNORMAL LOW (ref 3.87–5.11)
RBC: 3.54 MIL/uL — ABNORMAL LOW (ref 3.87–5.11)
RBC: 3.58 MIL/uL — ABNORMAL LOW (ref 3.87–5.11)
RBC: 3.7 MIL/uL — ABNORMAL LOW (ref 3.87–5.11)
RBC: 3.72 MIL/uL — ABNORMAL LOW (ref 3.87–5.11)
RBC: 3.75 MIL/uL — ABNORMAL LOW (ref 3.87–5.11)
RBC: 3.8 MIL/uL — ABNORMAL LOW (ref 3.87–5.11)
RBC: 3.99 MIL/uL (ref 3.87–5.11)
RBC: 4.1 MIL/uL (ref 3.87–5.11)
RDW: 12.6 % (ref 11.5–15.5)
RDW: 12.7 % (ref 11.5–15.5)
RDW: 12.8 % (ref 11.5–15.5)
RDW: 12.8 % (ref 11.5–15.5)
RDW: 12.9 % (ref 11.5–15.5)
RDW: 12.9 % (ref 11.5–15.5)
RDW: 13.2 % (ref 11.5–15.5)
RDW: 13.3 % (ref 11.5–15.5)
RDW: 13.4 % (ref 11.5–15.5)
RDW: 13.5 % (ref 11.5–15.5)
RDW: 13.9 % (ref 11.5–15.5)
RDW: 16.6 % — ABNORMAL HIGH (ref 11.5–15.5)
WBC: 10.2 10*3/uL (ref 4.0–10.5)
WBC: 10.9 10*3/uL — ABNORMAL HIGH (ref 4.0–10.5)
WBC: 10.9 10*3/uL — ABNORMAL HIGH (ref 4.0–10.5)
WBC: 10.9 10*3/uL — ABNORMAL HIGH (ref 4.0–10.5)
WBC: 11.2 10*3/uL — ABNORMAL HIGH (ref 4.0–10.5)
WBC: 11.2 10*3/uL — ABNORMAL HIGH (ref 4.0–10.5)
WBC: 11.5 10*3/uL — ABNORMAL HIGH (ref 4.0–10.5)
WBC: 11.6 10*3/uL — ABNORMAL HIGH (ref 4.0–10.5)
WBC: 11.9 10*3/uL — ABNORMAL HIGH (ref 4.0–10.5)
WBC: 7.4 10*3/uL (ref 4.0–10.5)
WBC: 8.1 10*3/uL (ref 4.0–10.5)
WBC: 8.4 10*3/uL (ref 4.0–10.5)

## 2010-07-26 LAB — POCT I-STAT 3, ART BLOOD GAS (G3+)
Acid-Base Excess: 4 mmol/L — ABNORMAL HIGH (ref 0.0–2.0)
Bicarbonate: 28.4 mEq/L — ABNORMAL HIGH (ref 20.0–24.0)
O2 Saturation: 95 %
TCO2: 30 mmol/L (ref 0–100)
pCO2 arterial: 42.5 mmHg (ref 35.0–45.0)
pH, Arterial: 7.433 — ABNORMAL HIGH (ref 7.350–7.400)
pO2, Arterial: 75 mmHg — ABNORMAL LOW (ref 80.0–100.0)

## 2010-07-26 LAB — POCT I-STAT 3, VENOUS BLOOD GAS (G3P V)
Acid-Base Excess: 3 mmol/L — ABNORMAL HIGH (ref 0.0–2.0)
Acid-Base Excess: 4 mmol/L — ABNORMAL HIGH (ref 0.0–2.0)
Bicarbonate: 28.4 mEq/L — ABNORMAL HIGH (ref 20.0–24.0)
Bicarbonate: 29.3 mEq/L — ABNORMAL HIGH (ref 20.0–24.0)
O2 Saturation: 63 %
O2 Saturation: 69 %
TCO2: 30 mmol/L (ref 0–100)
TCO2: 31 mmol/L (ref 0–100)
pCO2, Ven: 44 mmHg — ABNORMAL LOW (ref 45.0–50.0)
pCO2, Ven: 47.6 mmHg (ref 45.0–50.0)
pH, Ven: 7.384 — ABNORMAL HIGH (ref 7.250–7.300)
pH, Ven: 7.432 — ABNORMAL HIGH (ref 7.250–7.300)
pO2, Ven: 34 mmHg (ref 30.0–45.0)
pO2, Ven: 35 mmHg (ref 30.0–45.0)

## 2010-07-26 LAB — CARDIAC PANEL(CRET KIN+CKTOT+MB+TROPI)
CK, MB: 2.5 ng/mL (ref 0.3–4.0)
CK, MB: 2.6 ng/mL (ref 0.3–4.0)
Relative Index: 0.6 (ref 0.0–2.5)
Relative Index: 0.7 (ref 0.0–2.5)
Total CK: 351 U/L — ABNORMAL HIGH (ref 7–177)
Total CK: 430 U/L — ABNORMAL HIGH (ref 7–177)
Troponin I: 0.01 ng/mL (ref 0.00–0.06)
Troponin I: 0.04 ng/mL (ref 0.00–0.06)

## 2010-07-26 LAB — URINALYSIS, ROUTINE W REFLEX MICROSCOPIC
Bilirubin Urine: NEGATIVE
Glucose, UA: NEGATIVE mg/dL
Glucose, UA: NEGATIVE mg/dL
Hgb urine dipstick: NEGATIVE
Hgb urine dipstick: NEGATIVE
Ketones, ur: >80 mg/dL — AB
Ketones, ur: NEGATIVE mg/dL
Nitrite: NEGATIVE
Nitrite: NEGATIVE
Protein, ur: NEGATIVE mg/dL
Protein, ur: NEGATIVE mg/dL
Specific Gravity, Urine: 1.01 (ref 1.005–1.030)
Specific Gravity, Urine: 1.028 (ref 1.005–1.030)
Urobilinogen, UA: 1 mg/dL (ref 0.0–1.0)
Urobilinogen, UA: 1 mg/dL (ref 0.0–1.0)
pH: 6 (ref 5.0–8.0)
pH: 8.5 — ABNORMAL HIGH (ref 5.0–8.0)

## 2010-07-26 LAB — MAGNESIUM
Magnesium: 1.8 mg/dL (ref 1.5–2.5)
Magnesium: 1.9 mg/dL (ref 1.5–2.5)

## 2010-07-26 LAB — DIFFERENTIAL
Basophils Absolute: 0.1 10*3/uL (ref 0.0–0.1)
Basophils Absolute: 0.1 10*3/uL (ref 0.0–0.1)
Basophils Relative: 1 % (ref 0–1)
Basophils Relative: 1 % (ref 0–1)
Eosinophils Absolute: 0.1 10*3/uL (ref 0.0–0.7)
Eosinophils Absolute: 0.5 10*3/uL (ref 0.0–0.7)
Eosinophils Relative: 1 % (ref 0–5)
Eosinophils Relative: 6 % — ABNORMAL HIGH (ref 0–5)
Lymphocytes Relative: 16 % (ref 12–46)
Lymphocytes Relative: 21 % (ref 12–46)
Lymphs Abs: 1.7 10*3/uL (ref 0.7–4.0)
Lymphs Abs: 1.8 10*3/uL (ref 0.7–4.0)
Monocytes Absolute: 0.8 10*3/uL (ref 0.1–1.0)
Monocytes Absolute: 0.8 10*3/uL (ref 0.1–1.0)
Monocytes Relative: 10 % (ref 3–12)
Monocytes Relative: 7 % (ref 3–12)
Neutro Abs: 5.2 10*3/uL (ref 1.7–7.7)
Neutro Abs: 8.2 10*3/uL — ABNORMAL HIGH (ref 1.7–7.7)
Neutrophils Relative %: 62 % (ref 43–77)
Neutrophils Relative %: 76 % (ref 43–77)

## 2010-07-26 LAB — TSH
TSH: 1.332 u[IU]/mL (ref 0.350–4.500)
TSH: 1.793 u[IU]/mL (ref 0.350–4.500)

## 2010-07-26 LAB — CK TOTAL AND CKMB (NOT AT ARMC)
CK, MB: 1.6 ng/mL (ref 0.3–4.0)
Relative Index: 0.5 (ref 0.0–2.5)
Total CK: 296 U/L — ABNORMAL HIGH (ref 7–177)

## 2010-07-26 LAB — PROTIME-INR
INR: 1 (ref 0.00–1.49)
INR: 1.1 (ref 0.00–1.49)
INR: 1.1 (ref 0.00–1.49)
INR: 1.4 (ref 0.00–1.49)
Prothrombin Time: 13.9 seconds (ref 11.6–15.2)
Prothrombin Time: 14.4 seconds (ref 11.6–15.2)
Prothrombin Time: 14.5 seconds (ref 11.6–15.2)
Prothrombin Time: 17.5 seconds — ABNORMAL HIGH (ref 11.6–15.2)

## 2010-07-26 LAB — POCT CARDIAC MARKERS
CKMB, poc: 1.2 ng/mL (ref 1.0–8.0)
Myoglobin, poc: 262 ng/mL (ref 12–200)
Troponin i, poc: <0.05 ng/mL (ref 0.00–0.09)

## 2010-07-26 LAB — URINE CULTURE
Colony Count: 45000
Colony Count: NO GROWTH
Culture: NO GROWTH

## 2010-07-26 LAB — SODIUM, URINE, RANDOM: Sodium, Ur: 36 mEq/L

## 2010-07-26 LAB — TYPE AND SCREEN
ABO/RH(D): O NEG
Antibody Screen: NEGATIVE

## 2010-07-26 LAB — RPR: RPR Ser Ql: NONREACTIVE

## 2010-07-26 LAB — FOLATE: Folate: >20 ng/mL

## 2010-07-26 LAB — DIGOXIN LEVEL: Digoxin Level: 0.7 ng/mL — ABNORMAL LOW (ref 0.8–2.0)

## 2010-07-26 LAB — OSMOLALITY, URINE
Osmolality, Ur: 232 mOsm/kg — ABNORMAL LOW (ref 390–1090)
Osmolality, Ur: 712 mOsm/kg (ref 390–1090)

## 2010-07-26 LAB — D-DIMER, QUANTITATIVE: D-Dimer, Quant: 0.75 ug/mL-FEU — ABNORMAL HIGH (ref 0.00–0.48)

## 2010-07-26 LAB — VITAMIN B12: Vitamin B-12: >2000 pg/mL — ABNORMAL HIGH (ref 211–911)

## 2010-07-26 LAB — TROPONIN I: Troponin I: 0.02 ng/mL (ref 0.00–0.06)

## 2010-07-26 LAB — BRAIN NATRIURETIC PEPTIDE
Pro B Natriuretic peptide (BNP): 2924 pg/mL — ABNORMAL HIGH (ref 0.0–100.0)
Pro B Natriuretic peptide (BNP): 817 pg/mL — ABNORMAL HIGH (ref 0.0–100.0)

## 2010-07-26 LAB — APTT: aPTT: 31 seconds (ref 24–37)

## 2010-07-26 LAB — ABO/RH: ABO/RH(D): O NEG

## 2010-07-27 ENCOUNTER — Ambulatory Visit (HOSPITAL_COMMUNITY): Payer: Medicare Other | Attending: Family Medicine | Admitting: Radiology

## 2010-07-27 ENCOUNTER — Ambulatory Visit (INDEPENDENT_AMBULATORY_CARE_PROVIDER_SITE_OTHER): Payer: Medicare Other | Admitting: Cardiovascular Disease

## 2010-07-27 ENCOUNTER — Encounter: Payer: Self-pay | Admitting: Cardiovascular Disease

## 2010-07-27 DIAGNOSIS — M810 Age-related osteoporosis without current pathological fracture: Secondary | ICD-10-CM | POA: Insufficient documentation

## 2010-07-27 DIAGNOSIS — I428 Other cardiomyopathies: Secondary | ICD-10-CM

## 2010-07-27 DIAGNOSIS — I059 Rheumatic mitral valve disease, unspecified: Secondary | ICD-10-CM | POA: Insufficient documentation

## 2010-07-27 DIAGNOSIS — I4891 Unspecified atrial fibrillation: Secondary | ICD-10-CM

## 2010-07-27 DIAGNOSIS — I079 Rheumatic tricuspid valve disease, unspecified: Secondary | ICD-10-CM | POA: Insufficient documentation

## 2010-07-27 DIAGNOSIS — I447 Left bundle-branch block, unspecified: Secondary | ICD-10-CM

## 2010-07-27 DIAGNOSIS — I509 Heart failure, unspecified: Secondary | ICD-10-CM

## 2010-07-27 LAB — CBC
HCT: 23.9 % — ABNORMAL LOW (ref 36.0–46.0)
HCT: 24.4 % — ABNORMAL LOW (ref 36.0–46.0)
HCT: 24.7 % — ABNORMAL LOW (ref 36.0–46.0)
HCT: 24.8 % — ABNORMAL LOW (ref 36.0–46.0)
HCT: 24.9 % — ABNORMAL LOW (ref 36.0–46.0)
HCT: 25.1 % — ABNORMAL LOW (ref 36.0–46.0)
HCT: 25.9 % — ABNORMAL LOW (ref 36.0–46.0)
HCT: 26.3 % — ABNORMAL LOW (ref 36.0–46.0)
HCT: 26.4 % — ABNORMAL LOW (ref 36.0–46.0)
HCT: 26.5 % — ABNORMAL LOW (ref 36.0–46.0)
HCT: 26.8 % — ABNORMAL LOW (ref 36.0–46.0)
HCT: 27 % — ABNORMAL LOW (ref 36.0–46.0)
HCT: 27.1 % — ABNORMAL LOW (ref 36.0–46.0)
HCT: 27.2 % — ABNORMAL LOW (ref 36.0–46.0)
HCT: 27.4 % — ABNORMAL LOW (ref 36.0–46.0)
HCT: 27.8 % — ABNORMAL LOW (ref 36.0–46.0)
HCT: 28.4 % — ABNORMAL LOW (ref 36.0–46.0)
HCT: 28.6 % — ABNORMAL LOW (ref 36.0–46.0)
HCT: 28.8 % — ABNORMAL LOW (ref 36.0–46.0)
HCT: 29.6 % — ABNORMAL LOW (ref 36.0–46.0)
Hemoglobin: 10.2 g/dL — ABNORMAL LOW (ref 12.0–15.0)
Hemoglobin: 8.1 g/dL — ABNORMAL LOW (ref 12.0–15.0)
Hemoglobin: 8.3 g/dL — ABNORMAL LOW (ref 12.0–15.0)
Hemoglobin: 8.3 g/dL — ABNORMAL LOW (ref 12.0–15.0)
Hemoglobin: 8.3 g/dL — ABNORMAL LOW (ref 12.0–15.0)
Hemoglobin: 8.4 g/dL — ABNORMAL LOW (ref 12.0–15.0)
Hemoglobin: 8.6 g/dL — ABNORMAL LOW (ref 12.0–15.0)
Hemoglobin: 8.6 g/dL — ABNORMAL LOW (ref 12.0–15.0)
Hemoglobin: 8.8 g/dL — ABNORMAL LOW (ref 12.0–15.0)
Hemoglobin: 8.9 g/dL — ABNORMAL LOW (ref 12.0–15.0)
Hemoglobin: 9.2 g/dL — ABNORMAL LOW (ref 12.0–15.0)
Hemoglobin: 9.3 g/dL — ABNORMAL LOW (ref 12.0–15.0)
Hemoglobin: 9.4 g/dL — ABNORMAL LOW (ref 12.0–15.0)
Hemoglobin: 9.4 g/dL — ABNORMAL LOW (ref 12.0–15.0)
Hemoglobin: 9.4 g/dL — ABNORMAL LOW (ref 12.0–15.0)
Hemoglobin: 9.4 g/dL — ABNORMAL LOW (ref 12.0–15.0)
Hemoglobin: 9.5 g/dL — ABNORMAL LOW (ref 12.0–15.0)
Hemoglobin: 9.7 g/dL — ABNORMAL LOW (ref 12.0–15.0)
Hemoglobin: 9.9 g/dL — ABNORMAL LOW (ref 12.0–15.0)
Hemoglobin: 9.9 g/dL — ABNORMAL LOW (ref 12.0–15.0)
MCHC: 33 g/dL (ref 30.0–36.0)
MCHC: 33.2 g/dL (ref 30.0–36.0)
MCHC: 33.3 g/dL (ref 30.0–36.0)
MCHC: 33.4 g/dL (ref 30.0–36.0)
MCHC: 33.6 g/dL (ref 30.0–36.0)
MCHC: 33.7 g/dL (ref 30.0–36.0)
MCHC: 33.9 g/dL (ref 30.0–36.0)
MCHC: 33.9 g/dL (ref 30.0–36.0)
MCHC: 34 g/dL (ref 30.0–36.0)
MCHC: 34.4 g/dL (ref 30.0–36.0)
MCHC: 34.4 g/dL (ref 30.0–36.0)
MCHC: 34.6 g/dL (ref 30.0–36.0)
MCHC: 34.6 g/dL (ref 30.0–36.0)
MCHC: 34.7 g/dL (ref 30.0–36.0)
MCHC: 34.7 g/dL (ref 30.0–36.0)
MCHC: 34.8 g/dL (ref 30.0–36.0)
MCHC: 34.8 g/dL (ref 30.0–36.0)
MCHC: 34.9 g/dL (ref 30.0–36.0)
MCHC: 35 g/dL (ref 30.0–36.0)
MCHC: 35.1 g/dL (ref 30.0–36.0)
MCV: 89.6 fL (ref 78.0–100.0)
MCV: 90.1 fL (ref 78.0–100.0)
MCV: 90.2 fL (ref 78.0–100.0)
MCV: 90.4 fL (ref 78.0–100.0)
MCV: 90.4 fL (ref 78.0–100.0)
MCV: 90.7 fL (ref 78.0–100.0)
MCV: 90.8 fL (ref 78.0–100.0)
MCV: 90.8 fL (ref 78.0–100.0)
MCV: 91 fL (ref 78.0–100.0)
MCV: 91.2 fL (ref 78.0–100.0)
MCV: 91.3 fL (ref 78.0–100.0)
MCV: 91.3 fL (ref 78.0–100.0)
MCV: 91.6 fL (ref 78.0–100.0)
MCV: 91.6 fL (ref 78.0–100.0)
MCV: 91.6 fL (ref 78.0–100.0)
MCV: 91.8 fL (ref 78.0–100.0)
MCV: 92.6 fL (ref 78.0–100.0)
MCV: 92.7 fL (ref 78.0–100.0)
MCV: 92.9 fL (ref 78.0–100.0)
MCV: 93.2 fL (ref 78.0–100.0)
Platelets: 145 10*3/uL — ABNORMAL LOW (ref 150–400)
Platelets: 146 10*3/uL — ABNORMAL LOW (ref 150–400)
Platelets: 147 10*3/uL — ABNORMAL LOW (ref 150–400)
Platelets: 150 10*3/uL (ref 150–400)
Platelets: 155 10*3/uL (ref 150–400)
Platelets: 157 10*3/uL (ref 150–400)
Platelets: 163 10*3/uL (ref 150–400)
Platelets: 163 10*3/uL (ref 150–400)
Platelets: 168 10*3/uL (ref 150–400)
Platelets: 172 10*3/uL (ref 150–400)
Platelets: 187 10*3/uL (ref 150–400)
Platelets: 195 10*3/uL (ref 150–400)
Platelets: 205 10*3/uL (ref 150–400)
Platelets: 219 10*3/uL (ref 150–400)
Platelets: 246 10*3/uL (ref 150–400)
Platelets: 258 10*3/uL (ref 150–400)
Platelets: 260 10*3/uL (ref 150–400)
Platelets: 270 10*3/uL (ref 150–400)
Platelets: 273 10*3/uL (ref 150–400)
Platelets: 302 10*3/uL (ref 150–400)
RBC: 2.65 MIL/uL — ABNORMAL LOW (ref 3.87–5.11)
RBC: 2.68 MIL/uL — ABNORMAL LOW (ref 3.87–5.11)
RBC: 2.72 MIL/uL — ABNORMAL LOW (ref 3.87–5.11)
RBC: 2.72 MIL/uL — ABNORMAL LOW (ref 3.87–5.11)
RBC: 2.77 MIL/uL — ABNORMAL LOW (ref 3.87–5.11)
RBC: 2.77 MIL/uL — ABNORMAL LOW (ref 3.87–5.11)
RBC: 2.84 MIL/uL — ABNORMAL LOW (ref 3.87–5.11)
RBC: 2.86 MIL/uL — ABNORMAL LOW (ref 3.87–5.11)
RBC: 2.89 MIL/uL — ABNORMAL LOW (ref 3.87–5.11)
RBC: 2.89 MIL/uL — ABNORMAL LOW (ref 3.87–5.11)
RBC: 2.92 MIL/uL — ABNORMAL LOW (ref 3.87–5.11)
RBC: 2.96 MIL/uL — ABNORMAL LOW (ref 3.87–5.11)
RBC: 2.97 MIL/uL — ABNORMAL LOW (ref 3.87–5.11)
RBC: 2.97 MIL/uL — ABNORMAL LOW (ref 3.87–5.11)
RBC: 2.99 MIL/uL — ABNORMAL LOW (ref 3.87–5.11)
RBC: 3.05 MIL/uL — ABNORMAL LOW (ref 3.87–5.11)
RBC: 3.07 MIL/uL — ABNORMAL LOW (ref 3.87–5.11)
RBC: 3.11 MIL/uL — ABNORMAL LOW (ref 3.87–5.11)
RBC: 3.11 MIL/uL — ABNORMAL LOW (ref 3.87–5.11)
RBC: 3.29 MIL/uL — ABNORMAL LOW (ref 3.87–5.11)
RDW: 13.6 % (ref 11.5–15.5)
RDW: 13.8 % (ref 11.5–15.5)
RDW: 13.8 % (ref 11.5–15.5)
RDW: 13.9 % (ref 11.5–15.5)
RDW: 14.1 % (ref 11.5–15.5)
RDW: 14.1 % (ref 11.5–15.5)
RDW: 14.1 % (ref 11.5–15.5)
RDW: 14.2 % (ref 11.5–15.5)
RDW: 14.3 % (ref 11.5–15.5)
RDW: 14.4 % (ref 11.5–15.5)
RDW: 14.4 % (ref 11.5–15.5)
RDW: 14.4 % (ref 11.5–15.5)
RDW: 14.4 % (ref 11.5–15.5)
RDW: 14.5 % (ref 11.5–15.5)
RDW: 14.5 % (ref 11.5–15.5)
RDW: 14.6 % (ref 11.5–15.5)
RDW: 14.6 % (ref 11.5–15.5)
RDW: 14.7 % (ref 11.5–15.5)
RDW: 14.7 % (ref 11.5–15.5)
RDW: 14.7 % (ref 11.5–15.5)
WBC: 10.4 10*3/uL (ref 4.0–10.5)
WBC: 11.2 10*3/uL — ABNORMAL HIGH (ref 4.0–10.5)
WBC: 11.3 10*3/uL — ABNORMAL HIGH (ref 4.0–10.5)
WBC: 12.4 10*3/uL — ABNORMAL HIGH (ref 4.0–10.5)
WBC: 12.4 10*3/uL — ABNORMAL HIGH (ref 4.0–10.5)
WBC: 12.5 10*3/uL — ABNORMAL HIGH (ref 4.0–10.5)
WBC: 12.9 10*3/uL — ABNORMAL HIGH (ref 4.0–10.5)
WBC: 13.3 10*3/uL — ABNORMAL HIGH (ref 4.0–10.5)
WBC: 13.4 10*3/uL — ABNORMAL HIGH (ref 4.0–10.5)
WBC: 13.5 10*3/uL — ABNORMAL HIGH (ref 4.0–10.5)
WBC: 13.7 10*3/uL — ABNORMAL HIGH (ref 4.0–10.5)
WBC: 14.4 10*3/uL — ABNORMAL HIGH (ref 4.0–10.5)
WBC: 15.1 10*3/uL — ABNORMAL HIGH (ref 4.0–10.5)
WBC: 16.1 10*3/uL — ABNORMAL HIGH (ref 4.0–10.5)
WBC: 8.6 10*3/uL (ref 4.0–10.5)
WBC: 8.6 10*3/uL (ref 4.0–10.5)
WBC: 9.3 10*3/uL (ref 4.0–10.5)
WBC: 9.3 10*3/uL (ref 4.0–10.5)
WBC: 9.5 10*3/uL (ref 4.0–10.5)
WBC: 9.9 10*3/uL (ref 4.0–10.5)

## 2010-07-27 LAB — BASIC METABOLIC PANEL
BUN: 17 mg/dL (ref 6–23)
BUN: 18 mg/dL (ref 6–23)
BUN: 18 mg/dL (ref 6–23)
BUN: 19 mg/dL (ref 6–23)
CO2: 27 mEq/L (ref 19–32)
CO2: 31 mEq/L (ref 19–32)
CO2: 32 mEq/L (ref 19–32)
CO2: 33 mEq/L — ABNORMAL HIGH (ref 19–32)
Calcium: 8.2 mg/dL — ABNORMAL LOW (ref 8.4–10.5)
Calcium: 8.4 mg/dL (ref 8.4–10.5)
Calcium: 8.5 mg/dL (ref 8.4–10.5)
Calcium: 8.5 mg/dL (ref 8.4–10.5)
Chloride: 91 mEq/L — ABNORMAL LOW (ref 96–112)
Chloride: 93 mEq/L — ABNORMAL LOW (ref 96–112)
Chloride: 95 mEq/L — ABNORMAL LOW (ref 96–112)
Chloride: 97 mEq/L (ref 96–112)
Creatinine, Ser: 0.76 mg/dL (ref 0.4–1.2)
Creatinine, Ser: 0.79 mg/dL (ref 0.4–1.2)
Creatinine, Ser: 0.93 mg/dL (ref 0.4–1.2)
Creatinine, Ser: 0.95 mg/dL (ref 0.4–1.2)
GFR calc Af Amer: >60 mL/min (ref >60)
GFR calc Af Amer: >60 mL/min (ref >60)
GFR calc Af Amer: >60 mL/min (ref >60)
GFR calc Af Amer: >60 mL/min (ref >60)
GFR calc non Af Amer: 57 mL/min — ABNORMAL LOW (ref >60)
GFR calc non Af Amer: 59 mL/min — ABNORMAL LOW (ref >60)
GFR calc non Af Amer: >60 mL/min (ref >60)
GFR calc non Af Amer: >60 mL/min (ref >60)
Glucose, Bld: 104 mg/dL — ABNORMAL HIGH (ref 70–99)
Glucose, Bld: 105 mg/dL — ABNORMAL HIGH (ref 70–99)
Glucose, Bld: 128 mg/dL — ABNORMAL HIGH (ref 70–99)
Glucose, Bld: 145 mg/dL — ABNORMAL HIGH (ref 70–99)
Potassium: 3.6 mEq/L (ref 3.5–5.1)
Potassium: 4.3 mEq/L (ref 3.5–5.1)
Potassium: 4.4 mEq/L (ref 3.5–5.1)
Potassium: 4.9 mEq/L (ref 3.5–5.1)
Sodium: 130 mEq/L — ABNORMAL LOW (ref 135–145)
Sodium: 131 mEq/L — ABNORMAL LOW (ref 135–145)
Sodium: 132 mEq/L — ABNORMAL LOW (ref 135–145)
Sodium: 133 mEq/L — ABNORMAL LOW (ref 135–145)

## 2010-07-27 LAB — HEPARIN LEVEL (UNFRACTIONATED): Heparin Unfractionated: 0.82 IU/mL — ABNORMAL HIGH (ref 0.30–0.70)

## 2010-07-27 LAB — CROSSMATCH
ABO/RH(D): O NEG
Antibody Screen: NEGATIVE

## 2010-07-27 LAB — PROTIME-INR
INR: 1.1 (ref 0.00–1.49)
INR: 1.4 (ref 0.00–1.49)
INR: 1.9 — ABNORMAL HIGH (ref 0.00–1.49)
INR: 2.7 — ABNORMAL HIGH (ref 0.00–1.49)
Prothrombin Time: 14.7 seconds (ref 11.6–15.2)
Prothrombin Time: 17.6 seconds — ABNORMAL HIGH (ref 11.6–15.2)
Prothrombin Time: 23.1 seconds — ABNORMAL HIGH (ref 11.6–15.2)
Prothrombin Time: 30.2 seconds — ABNORMAL HIGH (ref 11.6–15.2)

## 2010-07-27 LAB — MAGNESIUM: Magnesium: 2 mg/dL (ref 1.5–2.5)

## 2010-07-27 LAB — PREPARE FRESH FROZEN PLASMA

## 2010-07-27 LAB — APTT
aPTT: 31 seconds (ref 24–37)
aPTT: 36 seconds (ref 24–37)

## 2010-07-27 LAB — COMPREHENSIVE METABOLIC PANEL
ALT: 18 U/L (ref 0–35)
AST: 28 U/L (ref 0–37)
Albumin: 2.9 g/dL — ABNORMAL LOW (ref 3.5–5.2)
Alkaline Phosphatase: 63 U/L (ref 39–117)
BUN: 22 mg/dL (ref 6–23)
CO2: 29 mEq/L (ref 19–32)
Calcium: 8.3 mg/dL — ABNORMAL LOW (ref 8.4–10.5)
Chloride: 94 mEq/L — ABNORMAL LOW (ref 96–112)
Creatinine, Ser: 1.07 mg/dL (ref 0.4–1.2)
GFR calc Af Amer: >60 mL/min (ref >60)
GFR calc non Af Amer: 50 mL/min — ABNORMAL LOW (ref >60)
Glucose, Bld: 125 mg/dL — ABNORMAL HIGH (ref 70–99)
Potassium: 4.6 mEq/L (ref 3.5–5.1)
Sodium: 131 mEq/L — ABNORMAL LOW (ref 135–145)
Total Bilirubin: 1.4 mg/dL — ABNORMAL HIGH (ref 0.3–1.2)
Total Protein: 5.4 g/dL — ABNORMAL LOW (ref 6.0–8.3)

## 2010-07-27 LAB — URINALYSIS, ROUTINE W REFLEX MICROSCOPIC
Bilirubin Urine: NEGATIVE
Glucose, UA: NEGATIVE mg/dL
Hgb urine dipstick: NEGATIVE
Ketones, ur: 15 mg/dL — AB
Nitrite: NEGATIVE
Protein, ur: NEGATIVE mg/dL
Specific Gravity, Urine: 1.03 (ref 1.005–1.030)
Urobilinogen, UA: 1 mg/dL (ref 0.0–1.0)
pH: 6 (ref 5.0–8.0)

## 2010-07-27 LAB — PHOSPHORUS: Phosphorus: 5.9 mg/dL — ABNORMAL HIGH (ref 2.3–4.6)

## 2010-07-27 LAB — BRAIN NATRIURETIC PEPTIDE: Pro B Natriuretic peptide (BNP): 545 pg/mL — ABNORMAL HIGH (ref 0.0–100.0)

## 2010-07-27 LAB — GLUCOSE, CAPILLARY: Glucose-Capillary: 175 mg/dL — ABNORMAL HIGH (ref 70–99)

## 2010-07-27 NOTE — Patient Instructions (Addendum)
Schedule F/U with [name] in ONE YEAR

## 2010-07-27 NOTE — Progress Notes (Signed)
[name] is a 77 yo female with a h/o parox. AFib, NICM, LBBB, h/o spontaneous retroperitoneal bleed on coumadin.  Last echo done in 07/2008 demonstrated an EF 35-40%, lat and post HK, inf. AK, mild-mod LAE.  Cath in 04/2008 demonstrated no CAD and EF 20%.  Prior notes document her dry weight at 156 lbs. Last visit with [name] weight 165 but BNP normal and no evidence of CHF.  Still thinks she is "retaining fluid" but I think her activity level is down and her caloric intake is up.  No signs of CHF  ROS: Denies fever, malais, weight loss, blurry vision, decreased visual acuity, cough, sputum, SOB, hemoptysis, pleuritic pain, palpitaitons, heartburn, abdominal pain, melena, lower extremity edema, claudication, or rash.   General: Affect appropriate Healthy:  appears stated age HEENT: normal Neck supple with no adenopathy JVP normal no bruits no thyromegaly Lungs clear with no wheezing and good diaphragmatic motion Heart:  S1/S2 no murmur,rub, gallop or click PMI normal Abdomen: benighn, BS positve, no tenderness, no AAA no bruit.  No HSM or HJR Distal pulses intact with no bruits No edema Neuro non-focal Skin warm and dry No muscular weakness   Current Outpatient Prescriptions  Medication Sig Dispense Refill  . aspirin 81 MG tablet Take 81 mg by mouth daily.        . Calcium Carb-Cholecalciferol 600-500 MG-UNIT CAPS 1 tab po qd       . Cholecalciferol (VITAMIN D3) 1000 UNITS CAPS 1 tab po qd       . ibuprofen (ADVIL,MOTRIN) 200 MG tablet Take 200 mg by mouth every 6 (six) hours as needed.        . metoprolol succinate (TOPROL-XL) 25 MG 24 hr tablet Take 25 mg by mouth daily.        . Multiple Vitamins-Minerals (MULTI COMPLETE PO) 1 tab po qd       . ranitidine (ZANTAC) 150 MG tablet 1/2 tab bid prn       . torsemide (DEMADEX) 20 MG tablet Take 20 mg by mouth daily.          Allergies  Ace inhibitors; Sulfamethoxazole w/trimethoprim; and Warfarin sodium  Electrocardiogram:   NSR 78 LBBB no change from prevous  Assessment and Plan

## 2010-07-27 NOTE — Assessment & Plan Note (Signed)
Stable functional class 2 and euvolemic  Echo in a year

## 2010-07-27 NOTE — Assessment & Plan Note (Signed)
Resolved Maint NSR  

## 2010-07-27 NOTE — Assessment & Plan Note (Signed)
STabel no evidence of hgh grade AV block

## 2010-08-23 ENCOUNTER — Encounter: Payer: Self-pay | Admitting: Family Medicine

## 2010-08-24 NOTE — Consult Note (Signed)
NAMEMARINDA, TYER NO.:  [id number]   MEDICAL RECORD NO.:  [id number]          PATIENT TYPE:  INP   LOCATION:  2501                         FACILITY:  [facility]   PHYSICIAN:  Antonietta Breach, M.D.  DATE OF BIRTH:  October 06, 1930   DATE OF CONSULTATION:  05/07/2008  DATE OF DISCHARGE:                                 CONSULTATION   REASON FOR CONSULTATION:  Agitation and psychosis.   HISTORY OF PRESENT ILLNESS:  Ms. Culbreth is a 77 year old female  admitted to the [facility] on April 30, 2008, for atrial  fibrillation.   Ms. Blandino has been experiencing 5 days of progressive confusion and  severe agitation.  She does continue with these symptoms after  amitriptyline was stopped and Zoloft was stopped.   Today she continues with mild clouding of consciousness, severe visual  hallucinations - talking to people in the room that are not there,  delusions.   The patient was on Zoloft prior to admission for a depression episode.  The dosage was 50 mg per day.  She also was on amitriptyline 10 mg  q.h.s.  As mentioned she was admitted in atrial fibrillation.  She does  have a history of shortness of breath with her cardiac problems.   PAST PSYCHIATRIC HISTORY:  Ms. Iwai is a poor historian at this time.  Her daughter and husband are interviewed.  She does have a history of  recurrent major depression 20 years ago, she saw a psychiatrist and was  placed on an antidepressant medication.  They do not recall the name.  She did not have psychotropic medication again until this past year when  she was placed on 50 mg of Zoloft per day.   They state that she does not have a history of decreased need for sleep  or increased energy.  She has no history of prior hallucinations or  other symptoms noted in the history of present illness.  She has never  attempted suicide.  She never has been in a psychiatric hospital.   FAMILY PSYCHIATRIC HISTORY:  None known.   SOCIAL  HISTORY:  Ms. Robers is married to a very supportive husband.  They live together in [place].  She does use occasional alcohol, not  daily, with no more than a glass of wine and there have never been any  secondary social or occupational difficulties.  She does not use illegal  drugs.  She has two children, a female who lives nearby and a female who  [place]ives south.  Occupation retired.   PAST MEDICAL HISTORY:  Atrial fibrillation which now has been corrected,  sciatic, gastroesophageal reflux disease.   PAST SURGICAL HISTORY:  Hysterectomy in 1979.  She has just recently  undergone a cardiac cath on January 25.   MEDICATIONS:  The MAR is reviewed.  She is on Ativan 1 mg that was given  today as a one time order.   ALLERGIES:  LODINE, PREVACID, FAMOTIDINE.   LABORATORY DATA:  Sodium 117, BUN 8, creatinine 0.59, glucose 91, WBC  11.9, hemoglobin 10.4, platelet count 370, folic acid normal, TSH  normal, B12 normal.  RPR normal.  Digoxin level was 0.7 on January 23.  On January 20 SGOT 22, SGPT 11.  Her albumin at that time was 2.8.   REVIEW OF SYSTEMS:  Ms. Gessel was not able to provide this.  The  review of systems is gleaned from the daughter, the husband and the  medical record.  Constitutional, head, eyes, ears, nose, mouth, throat,  neurologic, psychiatric, cardiovascular, respiratory, gastrointestinal,  genitourinary, musculoskeletal, hematologic, lymphatic, endocrine and  metabolic all unremarkable.   PHYSICAL EXAMINATION:  VITAL SIGNS:  Temperature 98.1, pulse 85,  respiratory rate 16, blood pressure 132/54, O2 saturation on room air  95%, weight 78.4 kg.  GENERAL APPEARANCE:  Ms. Thumm is an elderly female lying in a  partially reclined supine position in her hospital bed with a lap  belt/soft restraint in place.  She does not have any abnormal  involuntary movements.  However, she is making voluntary efforts to get  out of bed.  MENTAL STATUS EXAM:  Ms. Coachman has  mild clouding of consciousness.  Her eye contact is intermittent.  Her attention span is poor.  She is  easily distractible.  Concentration is poor.  Her affect is anxious.  Mood is anxious.  She is oriented to place as well as the year, the  month and the day of the month.  She is oriented to person.  Memory is  grossly deficient in recent recall.  However, also she cannot perform  formal mental status exam questions.  Her fund of knowledge and  intelligence are severely below that of her estimated premorbid  baseline.  Her speech is pressured.  There is no dysarthria.  Thought  process involves significant derailing, looseness of associations mixed  with some coherent phrases such as I want to go back over to the post  office, I cannot talk to you here in front of all these people.  Thought content, she is having visual hallucinations of people in the  room not there.  She is talking to no one there in the room.  She also  has delusional content with paranoia regarding people in the hospital.  Insight is poor, judgment is impaired.   ASSESSMENT:  AXIS I:  1. 293.00 delirium not otherwise specified.  She appears to have      multiple factors, at the age of her brain there is a relative      cholinergic deficiency and she was on 10 mg of Elavil prior to      admission.  She also has had an acute exacerbation of a condition      that would decrease cerebral oxygen availability with the atrial      fibrillation.  She has been given a small amount of opioid      necessary in her general medical care.  2. 293.83 mood disorder not otherwise specified, depressed.  3. Rule out major depressive disorder recurrent in partial remission      296.35.  AXIS II:  None.  AXIS III:  See past medical history.  AXIS IV:  General medical.  AXIS V:  50.   Given Ms. Mom's severe condition and her impairment in judgment  secondary to her psychosis, the undersigned discussed her case with her   husband.  Her husband also wanted the daughter present for processing  the information.   Mr. Cephas clearly has intact memory and intact judgment as well as  intact reasoning.  He lives independently and makes  regular decisions  for himself.   The undersigned discussed the importance of keeping memory and  orientation cues in the room.  The undersigned also discussed the  process of communicating with the patient involving supportively  disagreeing with the false beliefs and perceptions but empathically  stating that if one did have those that they would be afraid as well.  The undersigned discussed the prolonged QTC on the electrocardiogram  which is 547 milliseconds and that this would present an increased risk  of antipsychotic induced cardiac death due to an arrhythmia that could  be antipsychotic inducing.   The indications, alternatives and adverse effects of the following were  discussed with Mr. Wilner and his daughter:  Zyprexa for  antipsychosis/antiagitation given that Zyprexa is the antipsychotic with  the lowest QTC prolongation risk but would still have a risk of inducing  a lethal arrhythmia, the discussion also included the fact that if  delirium continues it does have an association with a more poorer  general medical prognosis as well; Ativan for anti acute agitation and  anxiety.   Mr. Klutts and his daughter both understand and they want to proceed as  follows:   RECOMMENDATIONS:  Would utilize Ativan 0.5 to 2 mg p.o. IM or IV q.2  hours p.r.n. severe agitation, combativeness.  Would also utilize the  Ativan to help the patient be calmed enough for reassessing the QTC.  The family states that if the QTC is no longer than the most recent EKG  reflects that they would like to proceed with Zyprexa as soon as  possible.  They also state that if an EKG or other form of assessing the  QTC is not possible given the patient's agitated state that they would  like to  proceed with the Zyprexa anyway.  The Zyprexa would best be  started at the lowest potentially effective dose of 2.5 mg p.o. or IM  daily starting at 1600 hours daily.  The dosage can then be adjusted as  needed and as tolerated to 5 mg daily while reassessing the QTC.  Of  course, Ms. Willhoite has very intensive cardiology management at this  point where the QTC risk in the context of an antipsychotic can be  addressed at an expert level.      Antonietta Breach, M.D.  Electronically Signed     JW/MEDQ  D:  05/07/2008  T:  05/07/2008  Job:  [id number]   cc:   Noralyn Pick. Eden Emms, MD, [facility]

## 2010-08-24 NOTE — Procedures (Signed)
CLINICAL HISTORY:  The patient is a 77 year old female who was admitted  for atrial fibrillation with rapid ventricular rate, and she was found  to have hyponatremia.  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   CURRENT MEDICATIONS:  Aspirin, potassium, Lanoxin, Coreg, Zantac,  Cozaar, Ativan, Zofran, Vicodin.   Upon awakening, the posterior background activity was diffusely slow and  dysrhythmic, in the delta range, but symmetric.  There is no evidence of  epileptiform discharge.  The background activity is not responsive to  shoulder moving, there are also description of patient sleeping and  snoring, but there was no significant change on the background activity  either.   A 17-channel EEG, 17th channel dedicated to EKG, which has demonstrated  normal sinus rhythm of 66 beats per minute   In conclusion, this is a marked abnormal study, there is evidence of  diffuse severe encephalopathic changes, common etiology is metabolic  toxic, in this case hyponatremia contributing, and may consider repeat  EEG later for progression.      Levert Feinstein, MD  Electronically Signed     [id number]  D:  05/07/2008 17:24:10  T:  05/08/2008 04:43:19  Job #:  [id number]

## 2010-08-24 NOTE — Cardiovascular Report (Signed)
NAMEGWYN, HIERONYMUS NO.:  [id number]   MEDICAL RECORD NO.:  [id number]          PATIENT TYPE:  INP   LOCATION:  2501                         FACILITY:  [facility]   PHYSICIAN:  Verne Carrow, MDDATE OF BIRTH:  Jan 19, 1931   DATE OF PROCEDURE:  05/05/2008  DATE OF DISCHARGE:                            CARDIAC CATHETERIZATION   PRIMARY CARDIOLOGIST:  Theron Arista C. Eden Emms, MD, [facility]   PROCEDURE PERFORMED:  1. Left heart catheterization.  2. Selective coronary angiography.  3. Left ventricular angiogram.  4. Right heart catheterization.  5. Placement of an Angio-Seal femoral artery closure device.   OPERATOR:  Verne Carrow, MD   INDICATION:  Nonischemic cardiomyopathy.   DETAILS OF PROCEDURE:  The patient was brought to the Inpatient Cardiac  Catheterization Laboratory after signing informed consent for the  procedure.  The right groin was prepped and draped in a sterile fashion.  A 6-French sheath was inserted into the right femoral vein.  A 6-French  sheath was inserted into the right femoral artery.  A right heart  catheterization was performed with a Swan-Ganz catheter.  A pigtail  catheter was then placed in the left ventricle.  Simultaneous pressure  measurements were made.  The Swan-Ganz catheter was removed.  A left  ventricular angiogram was performed in the RAO projection.  The pigtail  catheter was then pullback across the aortic valve with no significant  pressure gradient measured.  At this point, selective coronary  angiography was performed with the usual diagnostic catheters.  The  patient tolerated the procedure well.  An Angio-Seal femoral artery  closure device was placed in the right femoral artery.  The patient was  taken to the holding area in stable condition after her venous sheath  was removed here in the Cath Lab.   ANGIOGRAPHIC FINDINGS:  1. The left main coronary artery bifurcates into the circumflex and      the LAD and has  no evidence of disease.  2. The left anterior descending is a large vessel that courses to the      apex and gives off two large diagonal branches as well as two large      septal branches.  There is no angiographic evidence of disease.  3. The circumflex artery is a moderate-to-large size vessel that is      comprised mostly of a large obtuse marginal branch.  There is no      angiographic evidence of disease.  4. The right coronary artery is a large dominant vessel that gives off      a posterior descending branch as well as a branching posterolateral      segment.  There is no angiographic evidence of disease.  5. Left ventricular angiogram in the RAO projection shows global left      ventricular systolic dysfunction with an ejection fraction of 15-      20%.  No mitral regurgitation was noted.   HEMODYNAMIC DATA:  Central aortic pressure 119/60, LV pressure 128/8,  end-diastolic pressure 19, RA pressure 9, RV pressure 37/5, PA pressure  37/14 with a mean of 24,  pulmonary capillary wedge pressure mean of 18.   Oxygen saturations aortic 95%, RA 63%, PA 69%, cardiac output 5.14 (by  Fick method).  Cardiac index 2.71.   IMPRESSION:  1. No angiographic evidence of coronary artery disease.  2. Nonischemic cardiomyopathy.  3. Global left ventricular systolic dysfunction.   RECOMMENDATIONS:  Continued medical management.  The heparin will be  resumed 2 hours after the venous sheath has been pulled.      Verne Carrow, MD  Electronically Signed     CM/MEDQ  D:  05/05/2008  T:  05/05/2008  Job:  [id number]

## 2010-08-24 NOTE — Assessment & Plan Note (Signed)
[facility]                            CARDIOLOGY OFFICE NOTE   NAME:Eaton, Catherine KRONER                      MRN:          [id number]  DATE:05/30/2008                            DOB:          06-18-1930    [name] returns today for followup.  She had a prolonged hospital course  recently.  She was admitted with a new diagnosis of atrial fibrillation  and congestive heart failure.  She had a normal heart cath by Dr.  Antoine Poche.  She has a nonischemic cardiomyopathy with an EF of 15%.  She  appears to have had a viral illness.  She also has significant  persistent cough with probable microaspiration and this needs further  workup, but I do not believe she ever got a swallowing study in the  hospital.  She was on and off of amiodarone in the hospital.  She  continues to have significant nausea with it and I am not sure she is  going to be able to tolerate p.o. amiodarone on a t.i.d. basis.  Her  hospital course was complicated by severe hyponatremia.  This was  thought secondary to trazodone, another centrally acting agents in  addition to her diuretics and ACE inhibitor.   She also had severe delirium which was thought secondary to medication  is well, this took 2-3 days to resolve.  Since she had decreased LV  function with atrial fibrillation, we started her on Coumadin.  She had  a spontaneous life-threatening peritoneal bleed and is no longer a  candidate for Coumadin.  She feels a bit lethargic today.  She has been  checking her weights at home on a regular basis.  They have been spot on  between 169 and 170.  She is having problems with frequent urination at  night.   A few nights ago, she fell and hit her epigastric area and has a bruise.   Her review of systems is remarkable for no significant chest pain.  No  PND, orthopnea.  There is mild exertional dyspnea.  She has a persistent  cough, no sputum production or fever.  Her constipation is  improved, and  she has a bit of lethargy today.  She does have nausea with her p.m.  amiodarone dose but no vomiting, no melena, and no diarrhea.   She is intolerant to Coumadin.  She has p.r.n. benzonatate for cough and  Robitussin DM for cough.  She did not have a good response to [employer] in the hospital.  She has chronic left bundle-branch block.   CURRENT MEDICATIONS:  1. Digoxin 0.125 a day.  2. Stool softener.  3. Amiodarone to be decreased to 200 once a day p.r.n.  4. Pantoprazole 40 a day.  5. Lasix 20 b.i.d.  6. Carvedilol 6.25 b.i.d.   PHYSICAL EXAMINATION:  GENERAL:  Remarkable for a somewhat washed out  looking elderly female.  VITAL SIGNS:  Weight is 169, blood pressure 120/63, pulse 66 and  regular, respiratory rate 14, afebrile.  HEENT:  Unremarkable.  NECK:  Carotids are without bruit.  No lymphadenopathy, thyromegaly, or  JVP elevation.  LUNGS:  Clear.  Good diaphragmatic motion.  No wheezing.  S1 and S2.  PMI increased.  No obvious murmurs.  ABDOMEN:  She has a small ecchymosis in the epigastric area.  She still  has firmness in both lower quadrants from her rectus sheath bleed which  accompanied her peritoneal bleed.  Bowel sounds are positive.  No AAA.  EXTREMITIES:  Distal pulses are intact.  No edema.  NEURO:  Nonfocal.  SKIN:  Warm and dry.  MUSCULOSKELETAL:  No muscular weakness.   IMPRESSION:  1. Dilated cardiomyopathy, severe suspect.  We will do a followup MRI      or echo in 8 weeks to reassess left ventricular function.  I will      check her BNP and BMET today.  I would like to get her on an ACE      inhibitor depending on what her sodium is doing.  Weight is stable,      and she is monitoring well at home.  2. Atrial fibrillation, not a candidate for Coumadin.  Given life-      threatening bleed, currently in sinus rhythm.  Decreased amiodarone      to once a day in the morning given her side effect of nausea.  3. Hyponatremia.  I will  check her lab work today.  Hopefully, her      sodium be above 130 and we can possibly try adding a low-dose of an      ACE inhibitor for heart failure.  4. Left bundle-branch block, likely indicative of her cardiomyopathy.      No coronary artery disease by catheterization.  No evidence of high-      grade heart block or syncope.  5. Delirium, seems to be improved, likely iatrogenic in light of      medications given in the hospital and sundowning.  No alcohol      history and no previous history of Alzheimer's.  6. Persistent cough with likely microaspiration and we will refer to      Ear, Nose, and Throat.  I would like her to have indirect      laryngoscopy.  I do not think she had her swallowing study in the      hospital and this may be indicated after her ENT evaluation.  This      is an important problem, as she did have an aspiration in the      hospital and also I believe it was her constant coughing that      elicited her spontaneous peritoneal bleed while on Coumadin.  7. Primary care followup.  The patient will follow up with Dr. Hyacinth Meeker      as a new primary care doctor.  I think this is important, as she      seems to be an elderly patient who has not had close medical      followup in the past and seems to have a conglomerate of medical      problems all at once.   I will see her back in 4 weeks.  We will do her lab work today.     Catherine Pick. Eden Emms, MD, [facility]  Electronically Signed    PCN/MedQ  DD: 05/30/2008  DT: 05/30/2008  Job #: [id number]   cc:   Frederica Kuster, MD

## 2010-08-24 NOTE — Consult Note (Signed)
NAMETHURSA, [name]               ACCOUNT NO.:  [id number]   MEDICAL RECORD NO.:  [id number]          PATIENT TYPE:  INP   LOCATION:  2031                         FACILITY:  [facility]   PHYSICIAN:  Levert Feinstein, MD          DATE OF BIRTH:  [birth year]   DATE OF CONSULTATION:  DATE OF DISCHARGE:                                 CONSULTATION   CHIEF COMPLAINT:  Delirium.   HISTORY OF PRESENT ILLNESS:  The patient is a 77 year old Caucasian  female, was admitted to the Cardiac Service by Dr. Charlton Haws in  April 30, 2008 for acute onset of atrial fibrillation, presenting with  dyspnea, palpitation, and cough, was found to have atrial fibrillation  with rapid ventricular response and rate, was converted with amiodarone.   Since hospital stay, she gradually developed agitation and visual  hallucination.  Daughter at the bedside reported she began to see  letters on the wall and colors that spitting over people's clothes when  it was not existent.  She received cardiac cath on May 05, 2008  which has no angiographic evidence of coronary artery disease,  nonischemic cardiomyopathy, global left ventricular systolic dysfunction  with ejection fraction of 15-20%.   By reviewing the chart, she apparently has received guanfacine with  codeine, trazodone, Ativan, Ambien around January 25, and January 26,  because of persistent coughing, and difficulty falling to sleep.  From  May 06, 2008, she had increased agitation, became confused,  concurrent with evidence of hyponatremia, with baseline Na of 134, to  sodium gradually dropped to 122 in January 25, and became 115 on January  26.  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  She was on Zoloft and amitriptyline treatment too.   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  There  was no short-term  memory trouble, and since hospital staying agitation  has been persistent, but there is no lateralized motor or sensory  deficits.   REVIEW OF SYSTEMS:  Not obtainable.   PAST MEDICAL HISTORY:  Atrial fibrillation with rapid ventricular  response, cough, GERD, insomnia, status post hysterectomy in 1979.   FAMILY HISTORY:  Noncontributory.   SOCIAL HISTORY:  Denies smoking or drinking.  Lives with her husband  highly functional.   CURRENT MEDICATIONS:  Aspirin, Coreg 10, demeclocycline, digoxin,  Ativan, Cozaar, multivitamin, potassium, Zantac, Vicodin, and Zofran.   PHYSICAL EXAMINATION:  VITAL SIGNS:  Temperature 98.1, blood pressure  132/54, heart rate of 85.  CARDIAC:  Regular rate and rhythm.  PULMONARY:  Clear to auscultation bilaterally.  NECK:  Supple.  No carotid bruits.  NEUROLOGIC:  She was talking to herself, no dysarthria, no aphasia, not  oriented to time or place, follows some simple commands.  Cranial nerves  II-XII pupil equal, round, and reactive to light.  Extraocular movements  were full.  Facial sensation and strength was normal.  Uvula and tongue  midline and head turning shoulder shrugging were normal and  symmetric.  Motor examination she is in restrain but has no difficulty moving four  extremities.  Deep tendon reflex was 1/4 and symmetric.  Plantar  responses were flexor.  No dysmetria.   LABORATORY EVALUATION:  Sodium was 117 and rest of the BMP was normal.  B12 more than 2000.  TSH was normal.  RPR was negative.  CBC has anemia  with hemoglobin of 10.4.   ASSESSMENT:  A 77 year old female with delirium likely a combination of  hyponatremia due to syndrome of inappropriate antidiuretic hormone  (secretion), prolonged hospital staying, medicine side effect, and  elderly age.  She has nonfocal neurological examination.   PLAN:  1. CT of the head without contrast.  2. EEG.  3. Continue slowly correcting her hyponatremia and avoiding sedative       agent.      Levert Feinstein, MD  Electronically Signed     YY/MEDQ  D:  05/07/2008  T:  05/07/2008  Job:  [id number]

## 2010-08-24 NOTE — H&P (Signed)
[name], [name]               ACCOUNT NO.:  [id number]   MEDICAL RECORD NO.:  [id number]          PATIENT TYPE:  INP   LOCATION:  2606                         FACILITY:  [name]   PHYSICIAN:  [place] Pick. Eden Emms, MD, FACCDATE OF BIRTH:  [birth year]   DATE OF ADMISSION:  04/30/2008  DATE OF DISCHARGE:                              HISTORY & PHYSICAL   PRIMARY CARDIOLOGIST:  [name], being seen by [place].  Eden Emms, MD, [name]   PRIMARY CARE [name]:  [place]. Hyacinth Meeker, MD   THE PATIENT PROFILE:  A 77 year old Caucasian female without prior  cardiac history who presents with a 6-day history of dyspnea,  palpitations, and cough and found to be in AFib with RVR.   PROBLEMS:  1. Atrial fibrillation with rapid ventricular response.  2. Cough.  3. Sciatica.  4. Gastroesophageal reflux disease.  5. Insomnia.  6. Status post hysterectomy in 1979.   HISTORY OF PRESENT ILLNESS:  A 77 year old Caucasian female without  prior cardiac history.  Over the past 6 months, she has noted  progressive dyspnea on exertion.  She has no history of chest pain.  She  denies PND, orthopnea, nausea, vomiting, syncope, edema, palpitations.  This past Friday on April 24, 2008, she developed a cough, which she  described as nonproductive but very harsh and very frequent over the  weekend and the early part of this week.  She also began to feel as  though her throat was swollen, although she denies any pain.  With her  cough, she has had some more dyspnea on exertion than usual and  yesterday began to experience tachy palpitations and fluttering of her  heart.  Because of the persistence of the symptoms over the past 6 days  or so, she decided to go to her primary care [name] today.  There, an  ECG was performed and showed AFib with RVR.  She was sent urgently to  the [name] ED.  Here, she was treated with amiodarone 150 mg IV and  subsequently broke into sinus rhythm.  She denies chest  pain or dyspnea  at this time.   ALLERGIES:  LODINE and PREVACID.   HOME MEDICATIONS:  1. Zoloft 50 mg half a tablet daily.  2. Amitriptyline 10 mg nightly.  3. Zantac 75 mg b.i.d.  4. Multivitamin 1 daily.  5. Advil 200 mg p.r.n.  6. Vitamin D 400 units daily.  7. Aspirin 81 mg daily.  8. Ambien 5 mg nightly.   FAMILY HISTORY:  Mother died of tuberculosis at 67.  Father died of  cancer at 36.  She has no siblings.   SOCIAL HISTORY:  She lives in [name] with her husband.  She is  retired.  She denies tobacco or drug use.  She has an occasional beer or  wine.  She is very active at home with dyspnea on exertion over 6 months  as outlined above.   REVIEW OF SYSTEMS:  Positive for dyspnea on exertion over the past 6  months, which has been progressive.  Positive for nonproductive cough  over the past  week.  She did note some wheezing yesterday.  She did have  some tachy palpitations and fluttering yesterday.  Otherwise, all  systems reviewed and negative.  She is a full code.   PHYSICAL EXAMINATION:  VITAL SIGNS:  Blood pressure 109/58, heart rate  was 163, now 92.  She is afebrile.  Respirations 16.  Pulse ox 99% on 2  liters.  GENERAL:  A pleasant white female, in no acute distress.  Awake, alert,  and oriented x3.'  PSYCH:  Normal affect.  HEENT:  Normal.  NEUROLOGIC:  Grossly intact.  Nonfocal.  SKIN:  Warm and dry without lesions or masses.  MUSCULOSKELETAL:  Grossly normal without deformity or effusions.  NECK:  No bruits or JVD.  LUNGS:  Respirations are regular and unlabored with diffuse crackles at  the bases.  CARDIAC:  Initially irregular regular, tachycardic S1 and S2.  She is  now in regular rhythm without S3, S4, or murmurs.  ABDOMEN:  Round, soft, nontender, nondistended.  Bowel sounds present  x4.  EXTREMITIES:  Warm, dry, pink.  No clubbing, cyanosis, or edema.  Dorsalis pedis, posterior tibial pulses 2+ and equal bilaterally.   Chest x-ray read as  normal but appers to show Kerly B lines and  congestive failure to Dr. Fabio Bering review.  EKG shows atrial  fibrillation with a rate of 163 and a left axis and left bundle-branch  block.  Lab work is pending.   ASSESSMENT AND PLAN:  1. Atrial fibrillation with rapid ventricular response, question      duration:  She is now converted after IV amiodarone dose.  We plan      to admit and cycle cardiac markers.  Check echo, lytes, magnesium,      TSH, D-dimer, BNP.  We will continue amiodarone infusion, add beta-      blocker, and IV heparin.  Hold off on Coumadin for now.  Her CHADS      score equals 1 (age).  2. Cough:  Followup chest x-ray.  Has a low threshold.  We will cover      with antibiotics.  She is afebrile.  Follow up white count.  3. Gastroesophageal reflux disease:  Continue H2 blocker.  4. Sciatica:  Continue Elavil.  5. SOB:  Suspect cardiomyopathy given LBBB, CXR with PAF resulting      from high left atrial pressures.  Will check BNP and LV function by      echo.  If patient has poor LV function ACE and digoxen will be      added with possibility of right and left heart cath to follow after      patient compensated.      [name], ANP      [place] Pick. Eden Emms, MD, [name]  Electronically Signed    CB/MEDQ  D:  04/30/2008  T:  05/01/2008  Job:  [id number]

## 2010-08-24 NOTE — Consult Note (Signed)
NAMEDESTYNIE, TOOMEY NO.:  [id number]   MEDICAL RECORD NO.:  [id number]          PATIENT TYPE:  INP   LOCATION:  2031                         FACILITY:  [facility]   PHYSICIAN:  Antonietta Breach, M.D.  DATE OF BIRTH:  [birth year]   DATE OF CONSULTATION:  05/08/2008  DATE OF DISCHARGE:                                 CONSULTATION   This morning, Mrs. Stensland does have clearing of her mental status.  She  is alert.  Her energy is still decreased.  Concentration still mildly  decreased.  She does have intact future goals and interests.  She is no  longer having any hallucinations or delusions.  She is correctly  oriented to all spheres.  She also has some memory for the psychotic  period, recalling some of the false beliefs that she was experiencing.   She did not require the starting of an antipsychotic yet.   REVIEW OF SYSTEMS:  CARDIOVASCULAR:  Her EKG, QTC did come back at 498  milliseconds.   PHYSICAL EXAMINATION:  VITAL SIGNS:  Temperature 97.9, pulse 96,  respiratory rate 20, blood pressure 108/60.  O2 saturation on room air  93%.   MENTAL STATUS EXAM:  Mrs. Baltzell does keep her eyes closed most of the  time; however, she opens them appropriately.  She is alert.  Her  concentration is mildly decreased.  Affect mildly flat.  Mood is intact  with interests as described above.  She is oriented completely to all  spheres.  Her memory function is intact now.  Her memory is patchy for  the period of delirium.   Her speech is soft with a slightly flat prosody.  There is no  dysarthria.  Thought process is logical, coherent and goal-directed.  No  looseness of associations.  Thought content; no thoughts of harming  herself or others.  No delusions or hallucinations.  Insight is intact.  Judgment is currently intact.   ASSESSMENT:  Axis I:  293, 00 delirium, not otherwise specified.  This  has improved, however, the nature of delirium is such that this could  wax  and wane and she could return to psychosis and agitation.  However,  when delirium improves it typically does improve in a three-steps  forward one-step back fashion.   If her severe psychosis and agitation does return, would start Zyprexa  2.5 mg p.o. or IM q.1800 with caution about stiffness or other adverse  extrapyramidal side effects and would recheck an EKG, QTC.   Will also continue Ativan p.r.n. for now.   PRELIMINARY DISCHARGE PLANNING:  If she continues in a clear fashion,  outpatient psychiatric follow-up is available at one of the clinics  attached to [facility], [facility] or [facility],  specifically that would be for follow-up regarding her depression.   Also restarting the Zoloft once the delirium is completely cleared is  indicated for anti-depression.      Antonietta Breach, M.D.  Electronically Signed     JW/MEDQ  D:  05/08/2008  T:  05/08/2008  Job:  [id number]

## 2010-08-24 NOTE — Discharge Summary (Signed)
NAMEMIKAYLEE, ARSENEAU               ACCOUNT NO.:  [id number]   MEDICAL RECORD NO.:  [id number]          PATIENT TYPE:  INP   LOCATION:  3704                         FACILITY:  [facility]   PHYSICIAN:  Jesse Sans. Wall, MD, FACCDATE OF BIRTH:  1931/01/01   DATE OF ADMISSION:  04/30/2008  DATE OF DISCHARGE:  05/17/2008                               DISCHARGE SUMMARY   PRIMARY CARDIOLOGIST:  Theron Arista C. [place] Emms, MD, [facility]   PRINCIPAL DISCHARGE DIAGNOSES:  1. Paroxysmal atrial fibrillation with rapid ventricular response.      a.     Converted to normal sinus rhythm.  2. Spontaneous bleed.      a.     Secondary to pelvic hematoma.  3. Severe, nonischemic cardiomyopathy.      a.     Ejection fraction 15% by cardiac catheterization.  4. Hyponatremia.  5. Mild ileus.   REASON FOR ADMISSION:  Ms. Perdue is a pleasant 77 year old female,  with no prior history of a heart disease, who presented to the emergency  room with new-onset paroxysmal atrial fibrillation with rapid  ventricular response.  Following treatment with IV amiodarone in the  emergency room, she subsequently converted to normal sinus rhythm.  Of  note, she was assigned a [place] score of 1, secondary only to age.   HOSPITAL COURSE:  Subsequent workup consisted of right/left cardiac  catheterization, for further evaluation of congestive heart failure and  underlying left bundle-branch block.  Coronary angiography indicated no  angiographic evidence of CAD, but with marked global LV dysfunction (EF  50 - 20%).  There was no evidence of significant mitral regurgitation.   Postop course notable for development of a transient confusion, felt to  be multifactorial delirium.  She also developed acute-on-chronic  hyponatremia, with levels as low as 122, and was referred for renal  evaluation.  The patient was also assisted by Psychiatry for her acute  delirium, and was treated with low-dose Ativan.  It was felt that the  delirium might also  be in part due to the persistent hyponatremia, as  well as prolonged hospital stay.  Her hyponatremia gradually improved,  as did her encephalopathy.  Her last BMET on May 16, 2008, indicated  a sodium level of 133, with normal renal function.   The patient did have some recurrent atrial fibrillation, and amiodarone  was resumed.  She was subsequently converted to an oral dose of 200 mg  t.i.d., started on May 13, 2008, and continued at the time of  discharge.  Of note, additional recommendations by Renal were to  discontinue both Zoloft and Elavil, both of which could cause SIADH.  She was also treated with demeclocycline for the hyponatremia.  Following improvement in her sodium to the 130 range, demeclocycline was  discontinued, and no further recommendations were made by the Renal  Team.   [name] also noted some concern about difficulty swallowing, and  recommended outpatient EGD for further evaluation upon.   The patient then developed acute distress with abdominal pain and  hypotension, and was subsequently found to have a retroperitoneal  hematoma.  She required treatment with packed RBC transfusion as well as  fresh frozen plasma, for reversal of her anticoagulation.  Assistance  was also provided by the Critical Care Team.  The patient was  subsequently stabilized and continued to slowly improve.  [name]  discontinued Coumadin.  Of note, the patient also was not placed back on  low-dose aspirin, which she had been on prior to admission.   The patient's last chest x-ray on the morning of admission indicated  trace pleural effusion and mild left lower lobe atelectasis.  There was  some question of mild ileus, by abdominal x-ray.  [name] recommended  a followup CT scan approximately 2 weeks, for continued close monitoring  of the ileus.   Final recommendations by [name], on eve of discharge, were to  consider adding an ACE inhibitor as an outpatient, or  perhaps  hydralazine/nitrate combination.   LABORATORIES AT THE TIME OF DISCHARGE:  WBC 9.5, hemoglobin 9.9, and  hematocrit 28 (MCV 93).   DISCHARGE MEDICATIONS:  1. Lasix 20 mg b.i.d.  2. Coreg 6.25 b.i.d.  3. Colace 100 b.i.d.  4. Protonix 40 b.i.d.  5. Amiodarone 200 t.i.d.  6. Lanoxin 0.125 daily.  7. Tessalon 200 mg t.i.d.  8. Flonase 0.05% spray twice daily.   INSTRUCTIONS:  1. The patient is to not resume aspirin.  2. The patient will need a follow up CT scan of the abdomen/pelvis in      approximately 2 weeks, as per [name].  3. Arrangements will be made for Ms. Belflower to follow up with Dr.      Charlton Haws in our [facility] in the following 1-2 weeks.      The patient will be contacted by our office.  4. Arrangements will also be made for Ms. Lajeunesse to be provided with      a 30-day of free supply of furosemide, at the current dose, through      Dr. Shelby Dubin, at [facility].   DISCHARGE DURATION TIME:  Greater than 1 hour.      Gene Serpe, PA-C      Thomas C. Daleen Squibb, MD, [facility]  Electronically Signed    GS/MEDQ  D:  05/17/2008  T:  05/17/2008  Job:  [id number]   cc:   Frederica Kuster, MD

## 2010-10-07 ENCOUNTER — Ambulatory Visit (INDEPENDENT_AMBULATORY_CARE_PROVIDER_SITE_OTHER): Payer: Medicare Other | Admitting: Family Medicine

## 2010-10-07 ENCOUNTER — Encounter: Payer: Self-pay | Admitting: Family Medicine

## 2010-10-07 DIAGNOSIS — R0982 Postnasal drip: Secondary | ICD-10-CM

## 2010-10-07 DIAGNOSIS — M810 Age-related osteoporosis without current pathological fracture: Secondary | ICD-10-CM

## 2010-10-07 NOTE — Patient Instructions (Signed)
I would use nasal saline.  If that doesn't help, you can try over the counter claritin 10mg  a day.  If that doesn't help, let me know.

## 2010-10-07 NOTE — Assessment & Plan Note (Signed)
Try nasal saline and then claritin, f/u prn.

## 2010-10-07 NOTE — Progress Notes (Signed)
Osteoporosis.  We talked about Fosamax again.  She is aware of the risks of not treating it.  She doesn't want to start it but will continue to think about it.  She had not started the med.  She is on calcium and vitamin D.    Intermittently lost voice.  No pain.  Minimal cough.  Some postnasal gtt- "I think it's from the drip in my nose."  Going on for several months.  Tickle in throat noted.  No tobacco.  No FCNAV.  Minimal heartburn.  Feeling well o/w.  Meds, vitals, and allergies reviewed.   ROS: See HPI.  Otherwise, noncontributory.  GEN: nad, alert and oriented HEENT: mucous membranes moist, tm wnl, nasal exam with mild clear discharge and oral exam wnl NECK: supple w/o LA CV: rrr. PULM: ctab, no inc wob ABD: soft, +bs

## 2010-10-07 NOTE — Assessment & Plan Note (Addendum)
She declined treatment, is aware of risks. >25 min spent with face to face with patient, >50% counseling about osteoporosis.

## 2010-12-09 ENCOUNTER — Other Ambulatory Visit: Payer: Self-pay | Admitting: Cardiovascular Disease

## 2010-12-09 NOTE — Telephone Encounter (Signed)
Torsemide 20 mg. Pt requesting a 3 month supply.

## 2010-12-10 MED ORDER — TORSEMIDE 20 MG PO TABS: 20.0000 mg | ORAL_TABLET | Freq: Two times a day (BID) | ORAL | Status: DC

## 2010-12-14 ENCOUNTER — Ambulatory Visit: Payer: Medicare Other | Admitting: Family Medicine

## 2010-12-14 ENCOUNTER — Encounter: Payer: Self-pay | Admitting: Family Medicine

## 2010-12-14 DIAGNOSIS — Z0289 Encounter for other administrative examinations: Secondary | ICD-10-CM

## 2010-12-15 ENCOUNTER — Encounter: Payer: Self-pay | Admitting: Family Medicine

## 2010-12-15 ENCOUNTER — Ambulatory Visit (INDEPENDENT_AMBULATORY_CARE_PROVIDER_SITE_OTHER): Payer: Medicare Other | Admitting: Family Medicine

## 2010-12-15 VITALS — BP 118/74 | HR 83 | Temp 98.2°F | Wt 166.0 lb

## 2010-12-15 DIAGNOSIS — R300 Dysuria: Secondary | ICD-10-CM

## 2010-12-15 DIAGNOSIS — N390 Urinary tract infection, site not specified: Secondary | ICD-10-CM | POA: Insufficient documentation

## 2010-12-15 LAB — POCT URINALYSIS DIPSTICK
Bilirubin, UA: NEGATIVE
Glucose, UA: NEGATIVE
Ketones, UA: NEGATIVE
Nitrite, UA: POSITIVE
Protein, UA: NEGATIVE
Spec Grav, UA: 1.01
Urobilinogen, UA: NEGATIVE
pH, UA: 6

## 2010-12-15 MED ORDER — CIPROFLOXACIN HCL 250 MG PO TABS: 250.0000 mg | ORAL_TABLET | Freq: Two times a day (BID) | ORAL | Status: AC

## 2010-12-15 NOTE — Assessment & Plan Note (Signed)
Nontoxic, cipro, ucx and fluids.  F/u prn.

## 2010-12-15 NOTE — Progress Notes (Signed)
"  I have a UTI."  Taking OTC meds with some relief.   Dysuria:yes, burning, frequency, incomplete voiding duration of symptoms: 3-4 days.  abdominal pain:no fevers:no back pain:no vomiting:no other concerns: doing well o/w.    Meds, vitals, and allergies reviewed.   ROS: See HPI.  Otherwise negative.    GEN: nad, alert and oriented HEENT: mucous membranes moist NECK: supple CV: rrr PULM: ctab, no inc wob ABD: soft, +bs, suprapubic area not tender EXT: no edema SKIN: no acute rash BACK: no CVA pain

## 2010-12-15 NOTE — Patient Instructions (Signed)
Drink plenty of water and start the antibiotics today.  We'll contact you with your lab report.  Take care.   

## 2010-12-17 LAB — URINE CULTURE: Colony Count: 7000

## 2011-03-07 ENCOUNTER — Other Ambulatory Visit: Payer: Self-pay | Admitting: *Deleted

## 2011-03-07 MED ORDER — METOPROLOL SUCCINATE ER 25 MG PO TB24: 25.0000 mg | ORAL_TABLET | Freq: Every day | ORAL | Status: DC

## 2011-03-07 NOTE — Telephone Encounter (Signed)
Received faxed refill request from pharmacy. Refill sent to pharmacy electronically. 

## 2011-06-02 ENCOUNTER — Other Ambulatory Visit: Payer: Self-pay

## 2011-06-02 MED ORDER — METOPROLOL SUCCINATE ER 25 MG PO TB24: 25.0000 mg | ORAL_TABLET | Freq: Every day | ORAL | Status: DC

## 2011-06-02 NOTE — Telephone Encounter (Signed)
[facility] faxed request Metoprolol succinate 25 mg #90 x 0.

## 2011-07-19 ENCOUNTER — Encounter: Payer: Self-pay | Admitting: Family Medicine

## 2011-07-19 ENCOUNTER — Telehealth: Payer: Self-pay | Admitting: Family Medicine

## 2011-07-19 ENCOUNTER — Ambulatory Visit (INDEPENDENT_AMBULATORY_CARE_PROVIDER_SITE_OTHER): Payer: Medicare Other | Admitting: Family Medicine

## 2011-07-19 VITALS — BP 130/70 | HR 80 | Temp 99.7°F | Wt 171.0 lb

## 2011-07-19 DIAGNOSIS — R05 Cough: Secondary | ICD-10-CM

## 2011-07-19 DIAGNOSIS — R059 Cough, unspecified: Secondary | ICD-10-CM

## 2011-07-19 NOTE — Progress Notes (Signed)
SUBJECTIVE:  [name] is a 78 y.o. female who complains of coryza, congestion, sneezing and sore throat for 3 days. She denies a history of anorexia, chest pain, dizziness, fatigue, shortness of breath, vomiting, weakness and wheezing and denies a history of asthma. Patient denies smoke cigarettes.   Patient Active Problem List  Diagnoses  . CARDIOMYOPATHY, DILATED  . LBBB  . ATRIAL FIBRILLATION  . POST MI SEPTAL DEFECT  . UNSPECIFIED HEMORRHAGE  . KNEE PAIN, LEFT  . DELIRIUM  . COUGH  . HYPONATREMIA, HX OF  . OSTEOPOROSIS  . EDEMA  . Post-nasal drip  . UTI (lower urinary tract infection)   Past Medical History  Diagnosis Date  . Atrial fibrillation     h/o sig bleed on coumadin  . GERD (gastroesophageal reflux disease)   . Sciatica     Right  . Insomnia      off chronic ambien 5mg  as of 10/11, rare/episodic use since then  DCM/CHF  . Depression     previously on zoloft  . Osteoporosis     on DXA 05/2010  . H/O: hysterectomy 1979    (ovaries left intact)  . CHF (congestive heart failure)   . Diverticulosis     descending and sigmoid colon--Dr. Jarold Motto   Past Surgical History  Procedure Date  . Partial hysterectomy 1979    ovaries intact   History  Substance Use Topics  . Smoking status: Never Smoker   . Smokeless tobacco: Not on file  . Alcohol Use: Yes     very rare-beer or wine   Family History  Problem Relation Age of Onset  . Cancer Father   . Tuberculosis Mother    Allergies  Allergen Reactions  . Ace Inhibitors     REACTION: cough  . Sulfamethoxazole W/Trimethoprim     REACTION: GI intolerance.  . Warfarin Sodium    Current Outpatient Prescriptions on File Prior to Visit  Medication Sig Dispense Refill  . acetaminophen (TYLENOL) 325 MG tablet Take 650 mg by mouth every 6 (six) hours as needed.        [name] aspirin 81 MG tablet Take 81 mg by mouth daily.        . Biotin 10 MG TABS Take by mouth daily.        . Calcium-Vitamin D-Vitamin K  (CALCIUM SOFT CHEWS PO) Take by mouth daily.        . Cholecalciferol (VITAMIN D3) 1000 UNITS CAPS 1 tab po qd       . ibuprofen (ADVIL,MOTRIN) 200 MG tablet Take 200 mg by mouth every 6 (six) hours as needed.        . metoprolol succinate (TOPROL-XL) 25 MG 24 hr tablet Take 1 tablet (25 mg total) by mouth daily.  90 tablet  0  . ranitidine (ZANTAC) 150 MG tablet 1/2 tab bid prn       . torsemide (DEMADEX) 20 MG tablet Take 1 tablet (20 mg total) by mouth 2 (two) times daily.  60 tablet  3   The PMH, PSH, Social History, Family History, Medications, and allergies have been reviewed in [facility], and have been updated if relevant.   OBJECTIVE: BP 130/70  Pulse 80  Temp(Src) 99.7 F (37.6 C) (Oral)  Wt 171 lb (77.565 kg)  SpO2 96%  She appears well, vital signs are as noted. Ears normal.  Throat and pharynx normal.  Neck supple. No adenopathy in the neck. Nose is congested. Sinuses non tender. The chest is  clear, without wheezes or rales.  ASSESSMENT:  viral upper respiratory illness  PLAN: Symptomatic therapy suggested: push fluids, rest and return office visit prn if symptoms persist or worsen. Lack of antibiotic effectiveness discussed with her. Call or return to clinic prn if these symptoms worsen or fail to improve as anticipated.

## 2011-07-19 NOTE — Telephone Encounter (Signed)
Triage Record Num: [id number] Operator: Meribeth Mattes Patient Name: [name] Call Date & Time: 07/19/2011 8:13:51AM Patient Phone: [phone] PCP: Crawford Givens Patient Gender: Female PCP Fax : Patient DOB: 07-27-1930 Practice Name: [facility] Reason for Call: Caller: Jerry/Spouse; PCP: Crawford Givens Clelia Croft); CB#: [phone]; Call regarding cold sx and congestion, onset 07/15/11, worse over last few days, dry heaves, nausea, afebrile, although having chills, sounds rattly in chest Cough Protocol Utilized. Appt 07/19/11 1015 scheduled. Protocol(s) Used: Cough - Adult Recommended Outcome per Protocol: See Provider within 4 hours Reason for Outcome: New or worsening cough AND known cardiac or respiratory condition Care Advice: ~ 07/19/2011 8:29:18AM Page 1 of 1 [email or web address]

## 2011-07-19 NOTE — Telephone Encounter (Signed)
Seeing Dr. Mervyn Skeeters.

## 2011-07-19 NOTE — Patient Instructions (Signed)
This is probably a virus.  Drink lots of fluids.  Treat sympotmatically with Mucinex, nasal saline irrigation, and Tylenol/Ibuprofen. Also try claritin D or zyrtec D over the counter- two times a day as needed ( have to sign for them at pharmacy). You can use warm compresses. Call if not improving as expected in 5-7 days.

## 2011-07-26 ENCOUNTER — Ambulatory Visit (INDEPENDENT_AMBULATORY_CARE_PROVIDER_SITE_OTHER): Payer: Medicare Other | Admitting: Cardiovascular Disease

## 2011-07-26 ENCOUNTER — Encounter: Payer: Self-pay | Admitting: Cardiovascular Disease

## 2011-07-26 ENCOUNTER — Telehealth: Payer: Self-pay | Admitting: *Deleted

## 2011-07-26 VITALS — BP 106/71 | HR 72 | Ht 65.0 in | Wt 165.0 lb

## 2011-07-26 DIAGNOSIS — R059 Cough, unspecified: Secondary | ICD-10-CM

## 2011-07-26 DIAGNOSIS — R05 Cough: Secondary | ICD-10-CM

## 2011-07-26 DIAGNOSIS — R635 Abnormal weight gain: Secondary | ICD-10-CM

## 2011-07-26 DIAGNOSIS — I509 Heart failure, unspecified: Secondary | ICD-10-CM

## 2011-07-26 DIAGNOSIS — I4891 Unspecified atrial fibrillation: Secondary | ICD-10-CM

## 2011-07-26 DIAGNOSIS — R5381 Other malaise: Secondary | ICD-10-CM

## 2011-07-26 DIAGNOSIS — R531 Weakness: Secondary | ICD-10-CM

## 2011-07-26 DIAGNOSIS — R0602 Shortness of breath: Secondary | ICD-10-CM

## 2011-07-26 DIAGNOSIS — I447 Left bundle-branch block, unspecified: Secondary | ICD-10-CM

## 2011-07-26 LAB — BASIC METABOLIC PANEL
BUN: 24 mg/dL — ABNORMAL HIGH (ref 6–23)
CO2: 34 mEq/L — ABNORMAL HIGH (ref 19–32)
Calcium: 9.6 mg/dL (ref 8.4–10.5)
Chloride: 97 mEq/L (ref 96–112)
Creatinine, Ser: 1.2 mg/dL (ref 0.4–1.2)
GFR: 47.97 mL/min — ABNORMAL LOW (ref >60.00)
Glucose, Bld: 101 mg/dL — ABNORMAL HIGH (ref 70–99)
Potassium: 3.8 mEq/L (ref 3.5–5.1)
Sodium: 142 mEq/L (ref 135–145)

## 2011-07-26 LAB — BRAIN NATRIURETIC PEPTIDE: Pro B Natriuretic peptide (BNP): 36 pg/mL (ref 0.0–100.0)

## 2011-07-26 NOTE — Patient Instructions (Signed)
Your physician wants you to follow-up in: 6 MONTHS WITH DR Haywood Filler will receive a reminder letter in the mail two months in advance. If you don't receive a letter, please call our office to schedule the follow-up appointment. Your physician recommends that you continue on your current medications as directed. Please refer to the Current Medication list given to you today. Your physician has requested that you have a cardiac MRI. Cardiac MRI uses a computer to create images of your heart as its beating, producing both still and moving pictures of your heart and major blood vessels. For further information please visit [email or web address]. Please follow the instruction sheet given to you today for more information. DO ON FRIDAY MORNING OR  Thursday Your physician recommends that you return for lab work in: TODAY BMET BNP  DX WEAKNESS  FATIGUE

## 2011-07-26 NOTE — Assessment & Plan Note (Signed)
May be from URI but check BNP.  Continue supportive care and robitussin.

## 2011-07-26 NOTE — Telephone Encounter (Signed)
PT AWARE PER DR NISHAN CHANGE CARDIAC MRI TO ECHO  DX CHF  PT  HAVING DONE ON  08-09-11 AT 10:30 .[name]

## 2011-07-26 NOTE — Progress Notes (Signed)
[name] is a 78 yo female with a h/o parox. AFib, NICM, LBBB, h/o spontaneous retroperitoneal bleed on coumadin. Last echo done in 07/2008 demonstrated an EF 35-40%, lat and post HK, inf. AK, mild-mod LAE. Cath in 04/2008 demonstrated no CAD and EF 20%. Prior notes document her dry weight at 156 lbs. Today 165  Recent wheezing URI and cough.  Not clear if she has some volume overload.  She thinks she is getting better with claritin D and robitussin.  No edema.  No SSCP  Only taking diuretic once/day  ROS: Denies fever, malais, weight loss, blurry vision, decreased visual acuity, cough, sputum, SOB, hemoptysis, pleuritic pain, palpitaitons, heartburn, abdominal pain, melena, lower extremity edema, claudication, or rash.  All other systems reviewed and negative  General: Affect appropriate Healthy:  appears stated age HEENT: normal Neck supple with no adenopathy JVP normal no bruits no thyromegaly Lungs clear with no wheezing and good diaphragmatic motion Heart:  S1/S2 no murmur, no rub, gallop or click PMI normal Abdomen: benighn, BS positve, no tenderness, no AAA no bruit.  No HSM or HJR Distal pulses intact with no bruits No edema Neuro non-focal Skin warm and dry No muscular weakness   Current Outpatient Prescriptions  Medication Sig Dispense Refill  . acetaminophen (TYLENOL) 325 MG tablet Take 650 mg by mouth every 6 (six) hours as needed.        [name] aspirin 81 MG tablet Take 81 mg by mouth daily.        . Biotin 10 MG TABS Take by mouth daily.        . Calcium-Vitamin D-Vitamin K (CALCIUM SOFT CHEWS PO) Take by mouth daily.        . Cholecalciferol (VITAMIN D3) 1000 UNITS CAPS 1 tab po qd       . ibuprofen (ADVIL,MOTRIN) 200 MG tablet Take 200 mg by mouth every 6 (six) hours as needed.        . metoprolol succinate (TOPROL-XL) 25 MG 24 hr tablet Take 1 tablet (25 mg total) by mouth daily.  90 tablet  0  . ranitidine (ZANTAC) 150 MG tablet 1/2 tab bid prn       . torsemide  (DEMADEX) 20 MG tablet Take 20 mg by mouth daily.      [name] DISCONTD: torsemide (DEMADEX) 20 MG tablet Take 1 tablet (20 mg total) by mouth 2 (two) times daily.  60 tablet  3    Allergies  Ace inhibitors; Sulfamethoxazole w/trimethoprim; and Warfarin sodium  Electrocardiogram:  Assessment and Plan

## 2011-07-26 NOTE — Assessment & Plan Note (Signed)
Stable with no syncope  Likely related to DCM

## 2011-07-26 NOTE — Assessment & Plan Note (Signed)
Resolved.  In setting of decompensated CHF.  ASA

## 2011-07-26 NOTE — Progress Notes (Signed)
Addended by: Scherrie Bateman E on: 07/26/2011 03:57 PM   Modules accepted: Orders

## 2011-07-26 NOTE — Assessment & Plan Note (Signed)
Symptoms may represent exacerbation of CHF.  Cardiac MRI to assess EF.  BMET and BNP

## 2011-08-09 ENCOUNTER — Ambulatory Visit (HOSPITAL_COMMUNITY): Payer: Medicare Other | Attending: Cardiology

## 2011-08-09 ENCOUNTER — Other Ambulatory Visit: Payer: Self-pay

## 2011-08-09 DIAGNOSIS — I519 Heart disease, unspecified: Secondary | ICD-10-CM | POA: Insufficient documentation

## 2011-08-09 DIAGNOSIS — R609 Edema, unspecified: Secondary | ICD-10-CM | POA: Insufficient documentation

## 2011-08-09 DIAGNOSIS — I447 Left bundle-branch block, unspecified: Secondary | ICD-10-CM | POA: Insufficient documentation

## 2011-08-09 DIAGNOSIS — I079 Rheumatic tricuspid valve disease, unspecified: Secondary | ICD-10-CM | POA: Insufficient documentation

## 2011-08-09 DIAGNOSIS — I509 Heart failure, unspecified: Secondary | ICD-10-CM | POA: Insufficient documentation

## 2011-08-09 DIAGNOSIS — I428 Other cardiomyopathies: Secondary | ICD-10-CM | POA: Insufficient documentation

## 2011-08-09 DIAGNOSIS — R531 Weakness: Secondary | ICD-10-CM

## 2011-08-09 DIAGNOSIS — I4891 Unspecified atrial fibrillation: Secondary | ICD-10-CM | POA: Insufficient documentation

## 2011-08-09 DIAGNOSIS — I252 Old myocardial infarction: Secondary | ICD-10-CM | POA: Insufficient documentation

## 2011-08-17 ENCOUNTER — Encounter: Payer: Self-pay | Admitting: Family Medicine

## 2011-08-17 ENCOUNTER — Encounter: Payer: Self-pay | Admitting: *Deleted

## 2011-09-06 ENCOUNTER — Other Ambulatory Visit: Payer: Self-pay | Admitting: Cardiovascular Disease

## 2011-09-06 MED ORDER — METOPROLOL SUCCINATE ER 25 MG PO TB24: 25.0000 mg | ORAL_TABLET | Freq: Every day | ORAL | Status: DC

## 2011-09-06 NOTE — Telephone Encounter (Signed)
Pt is out of meds and per Pharm they have tried to contact us and we have not responded

## 2011-09-10 ENCOUNTER — Other Ambulatory Visit: Payer: Self-pay | Admitting: *Deleted

## 2011-09-10 MED ORDER — METOPROLOL SUCCINATE ER 25 MG PO TB24: 25.0000 mg | ORAL_TABLET | Freq: Every day | ORAL | Status: DC

## 2011-09-27 ENCOUNTER — Other Ambulatory Visit: Payer: Self-pay | Admitting: *Deleted

## 2011-09-27 MED ORDER — TORSEMIDE 20 MG PO TABS: 20.0000 mg | ORAL_TABLET | Freq: Every day | ORAL | Status: DC

## 2012-02-13 ENCOUNTER — Ambulatory Visit (INDEPENDENT_AMBULATORY_CARE_PROVIDER_SITE_OTHER): Payer: Medicare Other | Admitting: Family Medicine

## 2012-02-13 ENCOUNTER — Encounter: Payer: Self-pay | Admitting: Family Medicine

## 2012-02-13 VITALS — BP 112/72 | HR 84 | Temp 97.7°F | Wt 170.0 lb

## 2012-02-13 DIAGNOSIS — J3489 Other specified disorders of nose and nasal sinuses: Secondary | ICD-10-CM

## 2012-02-13 MED ORDER — FLUTICASONE PROPIONATE 50 MCG/ACT NA SUSP: NASAL | Status: DC

## 2012-02-13 NOTE — Progress Notes (Signed)
Sig nasal congestion for months.  Intermittent, when the antihistamines wears off.  She is having to mouth breath.  Usually doesn't have trouble like this in the fall.  Sig amount of rhinorrhea, but clear. No FNAVD.  No rash, ear pain but she did have some popping in her ears prev.  No ST.  Very minimal cough. She can't tolerate mucinex. Already taking claritin.    Meds, vitals, and allergies reviewed.   ROS: See HPI.  Otherwise, noncontributory.  nad ncat Tm wnl Nasal exam stuffy, clear rhinorrhea noted Op wnl Neck supple, no LA rrr ctab

## 2012-02-13 NOTE — Patient Instructions (Addendum)
Use the flonase and see if that helps.  You can try to taper off the claritin and other meds at that point.

## 2012-02-14 DIAGNOSIS — J3489 Other specified disorders of nose and nasal sinuses: Secondary | ICD-10-CM | POA: Insufficient documentation

## 2012-02-14 NOTE — Assessment & Plan Note (Signed)
Persistent, in spite of antihistamines.  Add on flonase and hopefully taper down on other cold meds at tolerated.  She agrees.

## 2012-03-05 ENCOUNTER — Ambulatory Visit (INDEPENDENT_AMBULATORY_CARE_PROVIDER_SITE_OTHER): Payer: Medicare Other | Admitting: Family Medicine

## 2012-03-05 ENCOUNTER — Encounter: Payer: Self-pay | Admitting: Family Medicine

## 2012-03-05 VITALS — BP 122/70 | HR 75 | Temp 97.6°F | Wt 172.8 lb

## 2012-03-05 DIAGNOSIS — F419 Anxiety disorder, unspecified: Secondary | ICD-10-CM | POA: Insufficient documentation

## 2012-03-05 DIAGNOSIS — R300 Dysuria: Secondary | ICD-10-CM

## 2012-03-05 DIAGNOSIS — N390 Urinary tract infection, site not specified: Secondary | ICD-10-CM

## 2012-03-05 DIAGNOSIS — F329 Major depressive disorder, single episode, unspecified: Secondary | ICD-10-CM

## 2012-03-05 DIAGNOSIS — F3289 Other specified depressive episodes: Secondary | ICD-10-CM

## 2012-03-05 DIAGNOSIS — F32A Depression, unspecified: Secondary | ICD-10-CM

## 2012-03-05 LAB — POCT URINALYSIS DIPSTICK
Spec Grav, UA: 1.02
pH, UA: 5

## 2012-03-05 MED ORDER — SERTRALINE HCL 25 MG PO TABS: 25.0000 mg | ORAL_TABLET | Freq: Every day | ORAL | Status: DC

## 2012-03-05 MED ORDER — CIPROFLOXACIN HCL 250 MG PO TABS: 250.0000 mg | ORAL_TABLET | Freq: Two times a day (BID) | ORAL | Status: DC

## 2012-03-05 NOTE — Assessment & Plan Note (Signed)
Restart SSRI and she'll notify me at f/u OV, sooner prn. She agrees.

## 2012-03-05 NOTE — Progress Notes (Signed)
Dysuria: yes duration of symptoms: since last wee abdominal pain:no fevers:no back pain:no vomiting:no other concerns:taking AZO with some transient relief.    H/o MDD prev. Now with lack of motivation, depressed mood, less to look forward to.  No SI/HI. Prev on SSRI, not recently.   Prev with some relief with meds.  D/w pt about options.    Meds, vitals, and allergies reviewed.   ROS: See HPI.  Otherwise negative.    GEN: nad, alert and oriented, speech wnl HEENT: mucous membranes moist NECK: supple CV: rrr. Murmur noted PULM: ctab, no inc wob ABD: soft, +bs, suprapubic area not tender EXT: no edema SKIN: no acute rash BACK: no CVA pain

## 2012-03-05 NOTE — Assessment & Plan Note (Signed)
Cipro, ucx, f/u prn.  Nontoxic.  

## 2012-03-05 NOTE — Patient Instructions (Addendum)
Drink plenty of water and start the antibiotics today.  We'll contact you with your lab report.  Take care.   Start the zoloft and call back if you have concerns.  We'll discuss it again at your visit next month.   Glad to see you.

## 2012-03-08 LAB — URINE CULTURE: Colony Count: >=100000

## 2012-03-14 ENCOUNTER — Other Ambulatory Visit: Payer: Self-pay | Admitting: Family Medicine

## 2012-03-14 DIAGNOSIS — M810 Age-related osteoporosis without current pathological fracture: Secondary | ICD-10-CM

## 2012-03-14 DIAGNOSIS — I4891 Unspecified atrial fibrillation: Secondary | ICD-10-CM

## 2012-03-14 DIAGNOSIS — E7800 Pure hypercholesterolemia, unspecified: Secondary | ICD-10-CM

## 2012-03-20 ENCOUNTER — Other Ambulatory Visit: Payer: Medicare Other

## 2012-03-20 ENCOUNTER — Other Ambulatory Visit (INDEPENDENT_AMBULATORY_CARE_PROVIDER_SITE_OTHER): Payer: Medicare Other

## 2012-03-20 DIAGNOSIS — E7800 Pure hypercholesterolemia, unspecified: Secondary | ICD-10-CM

## 2012-03-20 DIAGNOSIS — I4891 Unspecified atrial fibrillation: Secondary | ICD-10-CM

## 2012-03-20 DIAGNOSIS — M810 Age-related osteoporosis without current pathological fracture: Secondary | ICD-10-CM

## 2012-03-20 LAB — COMPREHENSIVE METABOLIC PANEL
ALT: 13 U/L (ref 0–35)
AST: 23 U/L (ref 0–37)
Albumin: 4 g/dL (ref 3.5–5.2)
Alkaline Phosphatase: 72 U/L (ref 39–117)
BUN: 15 mg/dL (ref 6–23)
CO2: 30 mEq/L (ref 19–32)
Calcium: 9.2 mg/dL (ref 8.4–10.5)
Chloride: 98 mEq/L (ref 96–112)
Creatinine, Ser: 0.9 mg/dL (ref 0.4–1.2)
GFR: 61.04 mL/min (ref >60.00)
Glucose, Bld: 95 mg/dL (ref 70–99)
Potassium: 3.9 mEq/L (ref 3.5–5.1)
Sodium: 138 mEq/L (ref 135–145)
Total Bilirubin: 0.7 mg/dL (ref 0.3–1.2)
Total Protein: 7.2 g/dL (ref 6.0–8.3)

## 2012-03-20 LAB — LDL CHOLESTEROL, DIRECT: Direct LDL: 92.6 mg/dL

## 2012-03-20 LAB — LIPID PANEL
Cholesterol: 211 mg/dL — ABNORMAL HIGH (ref 0–200)
HDL: 58.3 mg/dL (ref >39.00)
Total CHOL/HDL Ratio: 4
Triglycerides: 157 mg/dL — ABNORMAL HIGH (ref 0.0–149.0)
VLDL: 31.4 mg/dL (ref 0.0–40.0)

## 2012-03-20 LAB — TSH: TSH: 2.34 u[IU]/mL (ref 0.35–5.50)

## 2012-03-21 LAB — VITAMIN D 25 HYDROXY (VIT D DEFICIENCY, FRACTURES): Vit D, 25-Hydroxy: 50 ng/mL (ref 30–89)

## 2012-03-27 ENCOUNTER — Ambulatory Visit (INDEPENDENT_AMBULATORY_CARE_PROVIDER_SITE_OTHER): Payer: Medicare Other | Admitting: Family Medicine

## 2012-03-27 ENCOUNTER — Encounter: Payer: Self-pay | Admitting: Family Medicine

## 2012-03-27 VITALS — BP 130/80 | HR 66 | Temp 98.1°F | Ht 65.0 in | Wt 171.0 lb

## 2012-03-27 DIAGNOSIS — F329 Major depressive disorder, single episode, unspecified: Secondary | ICD-10-CM

## 2012-03-27 DIAGNOSIS — F32A Depression, unspecified: Secondary | ICD-10-CM

## 2012-03-27 DIAGNOSIS — Z1211 Encounter for screening for malignant neoplasm of colon: Secondary | ICD-10-CM

## 2012-03-27 DIAGNOSIS — M810 Age-related osteoporosis without current pathological fracture: Secondary | ICD-10-CM

## 2012-03-27 DIAGNOSIS — Z1331 Encounter for screening for depression: Secondary | ICD-10-CM

## 2012-03-27 DIAGNOSIS — I4891 Unspecified atrial fibrillation: Secondary | ICD-10-CM

## 2012-03-27 DIAGNOSIS — Z0000 Encounter for general adult medical examination without abnormal findings: Secondary | ICD-10-CM | POA: Insufficient documentation

## 2012-03-27 NOTE — Assessment & Plan Note (Signed)
See scanned forms.  Routine anticipatory guidance given to patient.  See health maintenance. Flu 2013 Shingles 2008 PNA 2008 Tetanus 2012 D/w patient [id number] for colon cancer screening, including IFOB vs. colonoscopy.  Risks and benefits of both were discussed and patient voiced understanding.  Pt elects [id number] Breast cancer screening d/w pt. Mammogram up to date Advance directive d/w pt.  Husband designated if incapacitated.  Cognitive function addressed- see scanned forms- and if abnormal then additional documentation follows.  D/w pt about DXA prev and again today.  She has declined treatment and there is no reason to continue with DXA in the future unless this changes.

## 2012-03-27 NOTE — Assessment & Plan Note (Signed)
D/w pt about DXA prev and again today.  She has declined treatment and there is no reason to continue with DXA in the future unless this changes.

## 2012-03-27 NOTE — Assessment & Plan Note (Signed)
Doing well, no edema on exam and feels well.  She can f/u with cards in 2014.

## 2012-03-27 NOTE — Assessment & Plan Note (Signed)
Improved, continue current meds and call back prn.  She agrees.

## 2012-03-27 NOTE — Progress Notes (Signed)
I have personally reviewed the Medicare Annual Wellness questionnaire and have noted 1. The patient's medical and social history 2. Their use of alcohol, tobacco or illicit drugs 3. Their current medications and supplements 4. The patient's functional ability including ADL's, fall risks, home safety risks and hearing or visual             impairment. 5. Diet and physical activities 6. Evidence for depression or mood disorders  The patients weight, height, BMI have been recorded in the chart and visual acuity is per eye clinic.  I have made referrals, counseling and provided education to the patient based review of the above and I have provided the pt with a written personalized care plan for preventive services.  See scanned forms.  Routine anticipatory guidance given to patient.  See health maintenance. Flu 2013 Shingles 2008 PNA 2008 Tetanus 2012 D/w patient [id number] for colon cancer screening, including IFOB vs. colonoscopy.  Risks and benefits of both were discussed and patient voiced understanding.  Pt elects [id number] Breast cancer screening d/w pt. Mammogram up to date Advance directive d/w pt.  Husband designated if incapacitated.  Cognitive function addressed- see scanned forms- and if abnormal then additional documentation follows.  D/w pt about DXA prev and again today.  She has declined treatment and there is no reason to continue with DXA in the future unless this changes.    Prev UTI sx resolved per patient.   Mood improved some on SSRI.  Less anxious and less worried.  Only on med for 2 weeks.  No ADE.  She plans to continue med.  Affect is brighter today.   H/o AF.  No CP, SOB, BLE edema.  Not on coumadin.    PMH and SH reviewed  Meds, vitals, and allergies reviewed.   ROS: See HPI.  Otherwise negative.    GEN: nad, alert and oriented, affect brighter than prev.  HEENT: mucous membranes moist NECK: supple w/o LA CV: sound to be rrr. PULM: ctab, no inc wob ABD:  soft, +bs EXT: no edema SKIN: no acute rash

## 2012-03-27 NOTE — Patient Instructions (Signed)
Take care.  Don't change your meds for now.  Let me know if you mood doesn't continue to improve.  I would like to see you back in the summer of 2014 for a brief check up.  Go to the lab on the way out.  We'll contact you with your lab report.

## 2012-04-03 ENCOUNTER — Other Ambulatory Visit (INDEPENDENT_AMBULATORY_CARE_PROVIDER_SITE_OTHER): Payer: Medicare Other

## 2012-04-03 ENCOUNTER — Encounter: Payer: Self-pay | Admitting: *Deleted

## 2012-04-03 DIAGNOSIS — Z1211 Encounter for screening for malignant neoplasm of colon: Secondary | ICD-10-CM

## 2012-04-03 LAB — FECAL OCCULT BLOOD, IMMUNOCHEMICAL: Fecal Occult Bld: NEGATIVE

## 2012-06-27 ENCOUNTER — Ambulatory Visit (INDEPENDENT_AMBULATORY_CARE_PROVIDER_SITE_OTHER): Payer: Medicare Other | Admitting: Family Medicine

## 2012-06-27 ENCOUNTER — Encounter: Payer: Self-pay | Admitting: Family Medicine

## 2012-06-27 ENCOUNTER — Encounter (INDEPENDENT_AMBULATORY_CARE_PROVIDER_SITE_OTHER): Payer: Medicare Other

## 2012-06-27 VITALS — BP 108/70 | HR 72 | Temp 97.8°F | Wt 176.0 lb

## 2012-06-27 DIAGNOSIS — M7989 Other specified soft tissue disorders: Secondary | ICD-10-CM

## 2012-06-27 DIAGNOSIS — R600 Localized edema: Secondary | ICD-10-CM

## 2012-06-27 DIAGNOSIS — M79609 Pain in unspecified limb: Secondary | ICD-10-CM

## 2012-06-27 DIAGNOSIS — R609 Edema, unspecified: Secondary | ICD-10-CM

## 2012-06-27 NOTE — Patient Instructions (Addendum)
See Shirlee Limerick about your referral before you leave today. They should call me with the report before you leave.

## 2012-06-27 NOTE — Progress Notes (Signed)
Not SOB.  She has had 10 days of R leg swelling and a knot at the R knee, posterior.  The knot wasn't ttp.  No trauma.  No L sided sx.  No fevers.  She can only feel the knot when she stretches out her R leg.  She had worn some knee highs before the sx started, but had also worn them prev w/o difficulty.  No bruising, no red streaks.  Off coumadin.  On ASA 81mg  a day.  No h/o DVT or PE.   Meds, vitals, and allergies reviewed.   ROS: See HPI.  Otherwise, noncontributory.  nad ncat rrr ctab abd soft R leg with nonpitting edema R calf circ 40cm L calf circ 37 cm Superficial knot inferior to the R popliteal fossa Distally nv intact

## 2012-06-27 NOTE — Assessment & Plan Note (Signed)
R sided, refer for u/s to eval for DVT.  Called report. prelim read neg for DVT, d/w pt.   Elevate leg in meantime, likely a baker's cyst given the location.   Will await the u/s final report.   >25 min spent with face to face with patient, >50% counseling and/or coordinating care

## 2012-06-29 ENCOUNTER — Telehealth: Payer: Self-pay

## 2012-06-29 NOTE — Telephone Encounter (Signed)
Pt left v/m requesting results on [place] of rt leg on 06/27/12.

## 2012-06-29 NOTE — Telephone Encounter (Signed)
Patient advised.

## 2012-06-29 NOTE — Telephone Encounter (Signed)
Neg for DVT on the prelim.  I don't have the final yet.  We'll send word when we get it.

## 2012-08-02 ENCOUNTER — Ambulatory Visit (INDEPENDENT_AMBULATORY_CARE_PROVIDER_SITE_OTHER): Payer: Medicare Other | Admitting: Family Medicine

## 2012-08-02 VITALS — BP 122/82 | HR 72 | Temp 98.2°F | Wt 173.8 lb

## 2012-08-02 DIAGNOSIS — M25569 Pain in unspecified knee: Secondary | ICD-10-CM

## 2012-08-02 DIAGNOSIS — F329 Major depressive disorder, single episode, unspecified: Secondary | ICD-10-CM

## 2012-08-02 DIAGNOSIS — F32A Depression, unspecified: Secondary | ICD-10-CM

## 2012-08-02 DIAGNOSIS — M25561 Pain in right knee: Secondary | ICD-10-CM

## 2012-08-02 MED ORDER — SERTRALINE HCL 25 MG PO TABS: 25.0000 mg | ORAL_TABLET | Freq: Two times a day (BID) | ORAL | Status: DC

## 2012-08-02 NOTE — Patient Instructions (Signed)
Work on leg raises and side leg lifts.  See if that helps your right knee pain.  Take the sertraline twice a day.  Let me know if that doesn't help.   Glad to see you.

## 2012-08-02 NOTE — Progress Notes (Signed)
She didn't think the 25mg  of sertraline was helping a full 24 hours.  She is episodically down and was asking about inc in SSRI dose, to 25mg  bid.  Discussed.   R leg.  She has likely cyst prev on the u/s.  No DVT.  She has been icing her knee. She has some sciatica on the R side at baseline (for decades). She'll have episodic swelling in the R leg from the knee distally that responds to icing. R knee is less puffy now but still bigger than the L knee. She thinks the posterior swelling is smaller now.    The sciatica burns down the R leg after she has strained her back with lifting. We talked about options.  She declined referral. She has had injections prev.    She has knee pain just as/after she is stands, esp after prolonged sitting.   Meds, vitals, and allergies reviewed.   ROS: See HPI.  Otherwise, noncontributory.  nad ncat rrr ctab abd soft R knee is slightly puffy but not red.  Med and lat joint lines not ttp but she has + patellofemoral pain, with compression and testing Cystic lesion felt in popliteal fossa Distally nv intact grossly

## 2012-08-03 ENCOUNTER — Encounter: Payer: Self-pay | Admitting: Family Medicine

## 2012-08-03 DIAGNOSIS — M25569 Pain in unspecified knee: Secondary | ICD-10-CM | POA: Insufficient documentation

## 2012-08-03 NOTE — Assessment & Plan Note (Signed)
With patellofemoral pain, likely baker's cyst. D/w pt about icing and then patellofemoral exercises, ie leg raises and side leg raises. She agrees.  F/u prn. No need for further vascular w/u.

## 2012-08-03 NOTE — Assessment & Plan Note (Signed)
Inc SSRi to 25mg  bid and she'll notify me if not improved. She agrees.

## 2012-08-20 ENCOUNTER — Encounter: Payer: Self-pay | Admitting: Family Medicine

## 2012-08-21 ENCOUNTER — Encounter: Payer: Self-pay | Admitting: *Deleted

## 2012-10-09 ENCOUNTER — Other Ambulatory Visit: Payer: Self-pay | Admitting: *Deleted

## 2012-10-09 MED ORDER — TORSEMIDE 20 MG PO TABS: 20.0000 mg | ORAL_TABLET | Freq: Every day | ORAL | Status: DC

## 2012-11-29 ENCOUNTER — Other Ambulatory Visit: Payer: Self-pay | Admitting: *Deleted

## 2012-11-29 ENCOUNTER — Telehealth: Payer: Self-pay | Admitting: *Deleted

## 2012-11-29 NOTE — Telephone Encounter (Signed)
Please call Mrs Tallo back to discuss refills on that med if she cant stop it

## 2012-11-29 NOTE — Telephone Encounter (Signed)
Pt wants to know if he can stop Metoprolol will route this to Dr. Fabio Bering nurse for review

## 2012-11-30 MED ORDER — METOPROLOL SUCCINATE ER 25 MG PO TB24: 25.0000 mg | ORAL_TABLET | Freq: Every day | ORAL | Status: DC

## 2012-11-30 NOTE — Telephone Encounter (Signed)
METOPROLOL REFILLED  WHILE  LETTING PT KNOW  PT  STATES  SHE HAS  HAD SOME DIZZY SPELLS  INSTRUCTED  PT TO  MONITOR   B/P AND HEART RATE  AND IF NOTES  CONSISTENTLY   LOW HEART RATE  OR B/P   READINGS TO CALL OFFICE  BACK HAS  F/U  APPT  ON  01-10-13 ./CY

## 2013-01-10 ENCOUNTER — Encounter: Payer: Self-pay | Admitting: Cardiovascular Disease

## 2013-01-10 ENCOUNTER — Ambulatory Visit (INDEPENDENT_AMBULATORY_CARE_PROVIDER_SITE_OTHER): Payer: Medicare Other | Admitting: Cardiovascular Disease

## 2013-01-10 VITALS — BP 127/71 | HR 69 | Ht 65.0 in | Wt 170.8 lb

## 2013-01-10 DIAGNOSIS — M25561 Pain in right knee: Secondary | ICD-10-CM

## 2013-01-10 DIAGNOSIS — I4891 Unspecified atrial fibrillation: Secondary | ICD-10-CM

## 2013-01-10 DIAGNOSIS — I429 Cardiomyopathy, unspecified: Secondary | ICD-10-CM

## 2013-01-10 DIAGNOSIS — I428 Other cardiomyopathies: Secondary | ICD-10-CM

## 2013-01-10 DIAGNOSIS — M25569 Pain in unspecified knee: Secondary | ICD-10-CM

## 2013-01-10 NOTE — Assessment & Plan Note (Signed)
Resolved Maint NSR Had been on amiodarone in past

## 2013-01-10 NOTE — Progress Notes (Signed)
Patient ID: [name], female   DOB: [birth year], 79 y.o.   MRN: [id number]     Dilana Mcphie is a 79 yo female with a h/o parox. AFib, NICM, LBBB, h/o spontaneous retroperitoneal bleed on coumadin. Last echo done in 07/2008 demonstrated an EF 35-40%, lat and post HK, inf. AK, mild-mod LAE. Cath in 04/2008 demonstrated no CAD and EF 20% Seems to be slowing down a bit.  Has pain in both knees and bakers cyst in right.  Needs to see ortho.  Has fatigue and some exertional dyspnea. Swelling in RLE from bakers cyst.  No chest pain PND or orthopnea.  Compliant with meds but doesn't like taking it.  BP's not running too low at home.  She is allergic to ACE inhibitors    ROS: Denies fever, malais, weight loss, blurry vision, decreased visual acuity, cough, sputum, SOB, hemoptysis, pleuritic pain, palpitaitons, heartburn, abdominal pain, melena, lower extremity edema, claudication, or rash.  All other systems reviewed and negative  General: Affect appropriate Healthy:  appears stated age HEENT: normal Neck supple with no adenopathy JVP normal no bruits no thyromegaly Lungs clear with no wheezing and good diaphragmatic motion Heart:  S1/S2 no murmur, no rub, gallop or click PMI normal Abdomen: benighn, BS positve, no tenderness, no AAA no bruit.  No HSM or HJR Distal pulses intact with no bruits No edema Neuro non-focal Skin warm and dry  Bakers cyst behend right knee not tender No muscular weakness   Current Outpatient Prescriptions  Medication Sig Dispense Refill  . acetaminophen (TYLENOL) 325 MG tablet Take 650 mg by mouth every 6 (six) hours as needed.        [name] aspirin 81 MG tablet Take 81 mg by mouth daily.        . Biotin 10 MG TABS Take by mouth daily.        . fluticasone (FLONASE) 50 MCG/ACT nasal spray 2 sprays per nostril per day  16 g  6  . ibuprofen (ADVIL,MOTRIN) 200 MG tablet Take 400 mg by mouth 2 (two) times daily as needed for pain.       [name] loratadine (CLARITIN) 10 MG  tablet Take 10 mg by mouth daily.      . metoprolol succinate (TOPROL-XL) 25 MG 24 hr tablet Take 1 tablet (25 mg total) by mouth daily.  30 tablet  2  . pseudoephedrine (SUDAFED) 30 MG tablet Take 30 mg by mouth every 4 (four) hours as needed.      . ranitidine (ZANTAC) 150 MG tablet 1/2 tab bid prn       . sertraline (ZOLOFT) 25 MG tablet Take 1 tablet (25 mg total) by mouth 2 (two) times daily.  180 tablet  3  . torsemide (DEMADEX) 20 MG tablet Take 1 tablet (20 mg total) by mouth daily.  60 tablet  12   No current facility-administered medications for this visit.    Allergies  Ace inhibitors; Sulfamethoxazole-trimethoprim; and Warfarin sodium  Electrocardiogram:  07/23/11 SR rate 72  LBBB LAD   Assessment and Plan

## 2013-01-10 NOTE — Assessment & Plan Note (Signed)
Refer to ortho  May benefit from cortisone injection R bakers cyst slowly resolving

## 2013-01-10 NOTE — Assessment & Plan Note (Signed)
Stable no advanced AV block If CHF progresses may benefit from BiV pacing

## 2013-01-10 NOTE — Assessment & Plan Note (Signed)
Some increased fatigue and dyspnea F/U MRI to quantitate EF and see if meds need to be advanced

## 2013-01-10 NOTE — Patient Instructions (Signed)
Your physician wants you to follow-up in:  YEAR WITH  DR Haywood Filler will receive a reminder letter in the mail two months in advance. If you don't receive a letter, please call our office to schedule the follow-up appointment. Your physician recommends that you continue on your current medications as directed. Please refer to the Current Medication list given to you today.  You have been referred to DR  GRAVES  OR   DR  [facility] Your physician has requested that you have a cardiac MRI. Cardiac MRI uses a computer to create images of your heart as its beating, producing both still and moving pictures of your heart and major blood vessels. For further information please visit [email or web address]. Please follow the instruction sheet given to you today for more information.

## 2013-01-28 ENCOUNTER — Telehealth: Payer: Self-pay | Admitting: Cardiovascular Disease

## 2013-01-28 NOTE — Telephone Encounter (Signed)
New Problem  Pt states that she will be out of town for 11/4. She states that she needs the MRI appt Resch// please call

## 2013-02-12 ENCOUNTER — Ambulatory Visit (HOSPITAL_COMMUNITY): Admission: RE | Admit: 2013-02-12 | Payer: Medicare Other | Source: Ambulatory Visit

## 2013-02-20 ENCOUNTER — Ambulatory Visit (HOSPITAL_COMMUNITY)
Admission: RE | Admit: 2013-02-20 | Discharge: 2013-02-20 | Disposition: A | Discharge status: Home / Self Care | Payer: Medicare Other | Source: Ambulatory Visit | Attending: Cardiovascular Disease | Admitting: Cardiovascular Disease

## 2013-02-20 DIAGNOSIS — I428 Other cardiomyopathies: Secondary | ICD-10-CM | POA: Insufficient documentation

## 2013-02-20 DIAGNOSIS — I517 Cardiomegaly: Secondary | ICD-10-CM | POA: Insufficient documentation

## 2013-02-20 DIAGNOSIS — I429 Cardiomyopathy, unspecified: Secondary | ICD-10-CM

## 2013-02-20 LAB — CREATININE, SERUM
Creatinine, Ser: 0.91 mg/dL (ref 0.50–1.10)
GFR calc Af Amer: 67 mL/min — ABNORMAL LOW (ref >90)
GFR calc non Af Amer: 58 mL/min — ABNORMAL LOW (ref >90)

## 2013-02-20 MED ORDER — GADOBENATE DIMEGLUMINE 529 MG/ML IV SOLN
25.0000 mL | Freq: Once | INTRAVENOUS | Status: AC | PRN
  Administered 2013-02-20: 25 mL via INTRAVENOUS

## 2013-03-05 ENCOUNTER — Other Ambulatory Visit: Payer: Self-pay

## 2013-03-05 MED ORDER — METOPROLOL SUCCINATE ER 25 MG PO TB24: 25.0000 mg | ORAL_TABLET | Freq: Every day | ORAL | Status: DC

## 2013-08-15 ENCOUNTER — Other Ambulatory Visit: Payer: Self-pay | Admitting: Family Medicine

## 2013-08-15 DIAGNOSIS — M810 Age-related osteoporosis without current pathological fracture: Secondary | ICD-10-CM

## 2013-08-15 DIAGNOSIS — I428 Other cardiomyopathies: Secondary | ICD-10-CM

## 2013-08-15 DIAGNOSIS — I4891 Unspecified atrial fibrillation: Secondary | ICD-10-CM

## 2013-08-19 ENCOUNTER — Other Ambulatory Visit (INDEPENDENT_AMBULATORY_CARE_PROVIDER_SITE_OTHER): Payer: Medicare Other

## 2013-08-19 DIAGNOSIS — I428 Other cardiomyopathies: Secondary | ICD-10-CM

## 2013-08-19 DIAGNOSIS — Z136 Encounter for screening for cardiovascular disorders: Secondary | ICD-10-CM

## 2013-08-19 DIAGNOSIS — I4891 Unspecified atrial fibrillation: Secondary | ICD-10-CM

## 2013-08-19 DIAGNOSIS — M810 Age-related osteoporosis without current pathological fracture: Secondary | ICD-10-CM

## 2013-08-19 LAB — LIPID PANEL
Cholesterol: 242 mg/dL — ABNORMAL HIGH (ref 0–200)
HDL: 58.4 mg/dL (ref >39.00)
LDL Cholesterol: 149 mg/dL — ABNORMAL HIGH (ref 0–99)
Total CHOL/HDL Ratio: 4
Triglycerides: 174 mg/dL — ABNORMAL HIGH (ref 0.0–149.0)
VLDL: 34.8 mg/dL (ref 0.0–40.0)

## 2013-08-19 LAB — COMPREHENSIVE METABOLIC PANEL
ALT: 13 U/L (ref 0–35)
AST: 23 U/L (ref 0–37)
Albumin: 3.9 g/dL (ref 3.5–5.2)
Alkaline Phosphatase: 72 U/L (ref 39–117)
BUN: 17 mg/dL (ref 6–23)
CO2: 30 mEq/L (ref 19–32)
Calcium: 9.4 mg/dL (ref 8.4–10.5)
Chloride: 100 mEq/L (ref 96–112)
Creatinine, Ser: 0.9 mg/dL (ref 0.4–1.2)
GFR: 65.63 mL/min (ref >60.00)
Glucose, Bld: 85 mg/dL (ref 70–99)
Potassium: 4 mEq/L (ref 3.5–5.1)
Sodium: 139 mEq/L (ref 135–145)
Total Bilirubin: 0.7 mg/dL (ref 0.2–1.2)
Total Protein: 7 g/dL (ref 6.0–8.3)

## 2013-08-19 LAB — TSH: TSH: 3.56 u[IU]/mL (ref 0.35–4.50)

## 2013-08-20 LAB — VITAMIN D 25 HYDROXY (VIT D DEFICIENCY, FRACTURES): Vit D, 25-Hydroxy: 36 ng/mL (ref 30–89)

## 2013-08-26 ENCOUNTER — Ambulatory Visit (INDEPENDENT_AMBULATORY_CARE_PROVIDER_SITE_OTHER): Payer: Medicare Other | Admitting: Family Medicine

## 2013-08-26 ENCOUNTER — Encounter: Payer: Self-pay | Admitting: Family Medicine

## 2013-08-26 VITALS — BP 112/70 | HR 71 | Temp 98.0°F | Ht 65.0 in | Wt 175.8 lb

## 2013-08-26 DIAGNOSIS — M25569 Pain in unspecified knee: Secondary | ICD-10-CM

## 2013-08-26 DIAGNOSIS — I4891 Unspecified atrial fibrillation: Secondary | ICD-10-CM

## 2013-08-26 DIAGNOSIS — F32A Depression, unspecified: Secondary | ICD-10-CM

## 2013-08-26 DIAGNOSIS — M810 Age-related osteoporosis without current pathological fracture: Secondary | ICD-10-CM

## 2013-08-26 DIAGNOSIS — Z0000 Encounter for general adult medical examination without abnormal findings: Secondary | ICD-10-CM

## 2013-08-26 DIAGNOSIS — F329 Major depressive disorder, single episode, unspecified: Secondary | ICD-10-CM

## 2013-08-26 DIAGNOSIS — F3289 Other specified depressive episodes: Secondary | ICD-10-CM

## 2013-08-26 MED ORDER — METOPROLOL SUCCINATE ER 25 MG PO TB24: 25.0000 mg | ORAL_TABLET | Freq: Every day | ORAL | Status: DC

## 2013-08-26 MED ORDER — CHOLECALCIFEROL 25 MCG (1000 UT) PO CAPS: 1000.0000 [IU] | ORAL_CAPSULE | Freq: Every day | ORAL | Status: DC

## 2013-08-26 MED ORDER — SERTRALINE HCL 50 MG PO TABS: 50.0000 mg | ORAL_TABLET | Freq: Every day | ORAL | Status: DC

## 2013-08-26 NOTE — Progress Notes (Signed)
Pre visit review using our clinic review tool, if applicable. No additional management support is needed unless otherwise documented below in the visit note.  I have personally reviewed the Medicare Annual Wellness questionnaire and have noted 1. The patient's medical and social history 2. Their use of alcohol, tobacco or illicit drugs 3. Their current medications and supplements 4. The patient's functional ability including ADL's, fall risks, home safety risks and hearing or visual             impairment. 5. Diet and physical activities 6. Evidence for depression or mood disorders  The patients weight, height, BMI have been recorded in the chart and visual acuity is per eye clinic.  I have made referrals, counseling and provided education to the patient based review of the above and I have provided the pt with a written personalized care plan for preventive services.  See scanned forms.  Routine anticipatory guidance given to patient.  See health maintenance. Flu 2014 Shingles 2008 PNA 2008 Tetanus 2012 Colonoscopy 2006, likely not needed now given her age.  D/w pt re: risk/benefits. No bleeding.  Breast cancer screening- she is going to go back this Friday for mammogram.  Advance directive d/w pt.  Husband designated if patient were incapacitated.  Cognitive function addressed- see scanned forms- and if abnormal then additional documentation follows.  D/w pt about DXA prev and again today. She has declined treatment and there is no reason to continue with DXA in the future unless this changes.   AF.  No CP, SOB, BLE edema.  No palpitations.  Labs d/w pt.  She didn't tolerated statins prev, discussed LDL.   MDD. Mood is fair, sx generally controlled.  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  Enjoys time with family.   PMH and SH reviewed  Meds, vitals, and allergies reviewed.   ROS: See HPI.  Otherwise negative.    GEN: nad, alert and oriented HEENT:  mucous membranes moist NECK: supple w/o LA CV: rrr. PULM: ctab, no inc wob ABD: soft, +bs EXT: no edema SKIN: no acute rash

## 2013-08-26 NOTE — Assessment & Plan Note (Signed)
Now sounds to be in NSR with acceptable fluid status. No change in meds.

## 2013-08-26 NOTE — Assessment & Plan Note (Signed)
Add on vit D, declined DXA.

## 2013-08-26 NOTE — Patient Instructions (Addendum)
It would be reasonable to take 1000 units of vitamin D a day.  Take care.  Glad to see you.  Recheck in about 1 year.   Call cardiology in the fall if they don't call you.

## 2013-08-26 NOTE — Assessment & Plan Note (Signed)
Improved with injection per ortho.

## 2013-08-26 NOTE — Assessment & Plan Note (Signed)
See scanned forms.  Routine anticipatory guidance given to patient.  See health maintenance. Flu 2014 Shingles 2008 PNA 2008 Tetanus 2012 Colonoscopy 2006, likely not needed now given her age.  D/w pt re: risk/benefits. No bleeding.  Breast cancer screening- she is going to go back this Friday for mammogram.  Advance directive d/w pt.  Husband designated if patient were incapacitated.  Cognitive function addressed- see scanned forms- and if abnormal then additional documentation follows.  D/w pt about DXA prev and again today. She has declined treatment and there is no reason to continue with DXA in the future unless this changes.

## 2013-08-26 NOTE — Assessment & Plan Note (Signed)
Mood is fair, sx generally controlled. 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 Enjoys time with family.  Not needing change in meds at this point.

## 2013-09-03 ENCOUNTER — Encounter: Payer: Self-pay | Admitting: Family Medicine

## 2013-12-28 ENCOUNTER — Other Ambulatory Visit: Payer: Self-pay | Admitting: Cardiovascular Disease

## 2013-12-30 ENCOUNTER — Ambulatory Visit (INDEPENDENT_AMBULATORY_CARE_PROVIDER_SITE_OTHER): Payer: Medicare Other

## 2013-12-30 DIAGNOSIS — Z23 Encounter for immunization: Secondary | ICD-10-CM

## 2014-01-14 ENCOUNTER — Ambulatory Visit (INDEPENDENT_AMBULATORY_CARE_PROVIDER_SITE_OTHER): Payer: Medicare Other | Admitting: Cardiovascular Disease

## 2014-01-14 ENCOUNTER — Encounter: Payer: Self-pay | Admitting: Cardiovascular Disease

## 2014-01-14 VITALS — BP 116/64 | HR 80 | Ht 65.0 in | Wt 175.8 lb

## 2014-01-14 DIAGNOSIS — I420 Dilated cardiomyopathy: Secondary | ICD-10-CM

## 2014-01-14 DIAGNOSIS — I447 Left bundle-branch block, unspecified: Secondary | ICD-10-CM

## 2014-01-14 DIAGNOSIS — I4891 Unspecified atrial fibrillation: Secondary | ICD-10-CM

## 2014-01-14 DIAGNOSIS — M25569 Pain in unspecified knee: Secondary | ICD-10-CM

## 2014-01-14 NOTE — Assessment & Plan Note (Signed)
Stable no change in ECG given age f/u q6 month ECG  No high grade heart block or syncope

## 2014-01-14 NOTE — Progress Notes (Signed)
Patient ID: TRACA GILLOCK, female   DOB: [birth year], 80 y.o.   MRN: [id number] [name] is a 80 yo female with a h/o parox. AFib, NICM, LBBB, h/o spontaneous retroperitoneal bleed on coumadin. Last echo done in 07/2008 demonstrated an EF 35-40%, lat and post HK, inf. AK, mild-mod LAE. Cath in 04/2008 demonstrated no CAD and EF 20% Seems to be slowing down a bit. Has pain in both knees and bakers cyst in right. Needs to see ortho. Has fatigue and some exertional dyspnea. Swelling in RLE from bakers cyst. No chest pain PND or orthopnea. Compliant with meds but doesn't like taking it. BP's not running too low at home. She is allergic to ACE inhibitors   Echo 2013  EF 45-50%   Study Conclusions  - Left ventricle: The cavity size was normal. Wall thickness was normal. Systolic function was mildly reduced. The estimated ejection fraction was in the range of 45% to 50%. Diffuse hypokinesis. Doppler parameters are consistent with abnormal left ventricular relaxation (grade 1 diastolic dysfunction). - Ventricular septum: Septal motion showed abnormal function and dyssynergy. These changes are consistent with a left bundle branch block. - Tricuspid valve: Mild-moderate regurgitation.  Having some LE edema and exertional dyspnea  Helped by lasix   ROS: Denies fever, malais, weight loss, blurry vision, decreased visual acuity, cough, sputum, SOB, hemoptysis, pleuritic pain, palpitaitons, heartburn, abdominal pain, melena, lower extremity edema, claudication, or rash.  All other systems reviewed and negative  General: Affect appropriate Healthy:  appears stated age HEENT: normal Neck supple with no adenopathy JVP normal no bruits no thyromegaly Lungs clear with no wheezing and good diaphragmatic motion Heart:  S1/S2 no murmur, no rub, gallop or click PMI normal Abdomen: benighn, BS positve, no tenderness, no AAA no bruit.  No HSM or HJR Distal pulses intact with no bruits No edema Neuro  non-focal Skin warm and dry No muscular weakness   Current Outpatient Prescriptions  Medication Sig Dispense Refill  . aspirin 81 MG tablet Take 81 mg by mouth daily.        . Biotin 5000 MCG CAPS Take 1 capsule by mouth.      . Cholecalciferol ([employer] VITAMIN D3) 1000 UNITS capsule Take 1 capsule (1,000 Units total) by mouth daily.      [name] loratadine (CLARITIN) 10 MG tablet Take 10 mg by mouth daily.      . metoprolol succinate (TOPROL-XL) 25 MG 24 hr tablet Take 1 tablet (25 mg total) by mouth daily.  90 tablet  3  . naproxen sodium (ANAPROX) 220 MG tablet Take 220 mg by mouth at bedtime as needed.      . pseudoephedrine (SUDAFED) 30 MG tablet Take 30 mg by mouth every 4 (four) hours as needed.      . ranitidine (ZANTAC) 150 MG tablet 1/2 tab bid prn       . sertraline (ZOLOFT) 50 MG tablet Take 1 tablet (50 mg total) by mouth daily.  90 tablet  3  . torsemide (DEMADEX) 20 MG tablet TAKE 1 TABLET BY MOUTH DAILY  60 tablet  1   No current facility-administered medications for this visit.    Allergies  Ace inhibitors; Sulfamethoxazole-trimethoprim; and Warfarin sodium  Electrocardiogram: SR rate 71  LAD LBBB  2014  Today no change SR rate 80  LAD LBBB   Assessment and Plan

## 2014-01-14 NOTE — Assessment & Plan Note (Signed)
Good relief with injection by Dr Margreta Journey  F/u ortho for osteoarthritis both knees

## 2014-01-14 NOTE — Assessment & Plan Note (Signed)
Maint NSR with no palpitations stable 

## 2014-01-14 NOTE — Patient Instructions (Signed)
Your physician wants you to follow-up in:   6 MONTHS WITH DR Haywood Filler will receive a reminder letter in the mail two months in advance. If you don't receive a letter, please call our office to schedule the follow-up appointment.  Your physician recommends that you continue on your current medications as directed. Please refer to the Current Medication list given to you today. Your physician has requested that you have an echocardiogram. Echocardiography is a painless test that uses sound waves to create images of your heart. It provides your doctor with information about the size and shape of your heart and how well your heart's chambers and valves are working. This procedure takes approximately one hour. There are no restrictions for this procedure. CHF

## 2014-01-14 NOTE — Assessment & Plan Note (Signed)
F/U echo to see if EF still improved continue current meds.  Discussed sliding scale PRN lasix

## 2014-01-23 ENCOUNTER — Ambulatory Visit (HOSPITAL_COMMUNITY): Payer: Medicare Other | Attending: Cardiology | Admitting: Radiology

## 2014-01-23 DIAGNOSIS — I420 Dilated cardiomyopathy: Secondary | ICD-10-CM

## 2014-01-23 NOTE — Progress Notes (Signed)
Echocardiogram performed.  

## 2014-04-21 ENCOUNTER — Other Ambulatory Visit: Payer: Self-pay | Admitting: Cardiovascular Disease

## 2014-07-22 ENCOUNTER — Ambulatory Visit (INDEPENDENT_AMBULATORY_CARE_PROVIDER_SITE_OTHER): Payer: Medicare Other | Admitting: Cardiovascular Disease

## 2014-07-22 ENCOUNTER — Encounter: Payer: Self-pay | Admitting: Cardiovascular Disease

## 2014-07-22 VITALS — BP 128/74 | HR 78 | Ht 65.0 in | Wt 176.0 lb

## 2014-07-22 DIAGNOSIS — R600 Localized edema: Secondary | ICD-10-CM

## 2014-07-22 DIAGNOSIS — I480 Paroxysmal atrial fibrillation: Secondary | ICD-10-CM | POA: Diagnosis not present

## 2014-07-22 DIAGNOSIS — I447 Left bundle-branch block, unspecified: Secondary | ICD-10-CM | POA: Diagnosis not present

## 2014-07-22 DIAGNOSIS — I420 Dilated cardiomyopathy: Secondary | ICD-10-CM

## 2014-07-22 NOTE — Assessment & Plan Note (Signed)
Dependant LE edema Taking lasix 3 x or so per/ week stable Discussed low sodium diet

## 2014-07-22 NOTE — Assessment & Plan Note (Signed)
Chronic STable no high grade heart block f/u ECG in a year

## 2014-07-22 NOTE — Patient Instructions (Signed)
Your physician wants you to follow-up in:   Year with   DR NISHAN You will receive a reminder letter in the mail two months in advance. If you don't receive a letter, please call our office to schedule the follow-up appointment. Your physician recommends that you continue on your current medications as directed. Please refer to the Current Medication list given to you today.  

## 2014-07-22 NOTE — Progress Notes (Signed)
Patient ID: EDITA KEACH, female   DOB: December 15, 1930, 81 y.o.   MRN: [id number] Kollette Rolf is a 81 y.o.  female with a h/o parox. AFib, NICM, LBBB, h/o spontaneous retroperitoneal bleed on coumadin. Last echo done in 07/2008 demonstrated an EF 35-40%, lat and post HK, inf. AK, mild-mod LAE. Cath in 04/2008 demonstrated no CAD and EF 20% Seems to be slowing down a bit. Has pain in both knees and bakers cyst in right. Needs to see ortho. Has fatigue and some exertional dyspnea. Swelling in RLE from bakers cyst. No chest pain PND or orthopnea. Compliant with meds but doesn't like taking it. BP's not running too low at home. She is allergic to ACE inhibitors   Echo 2015  Reviewed:  Abnormal septal motion Ef 50-55%   Having some LE edema and exertional dyspnea  Helped by lasix   ROS: Denies fever, malais, weight loss, blurry vision, decreased visual acuity, cough, sputum, SOB, hemoptysis, pleuritic pain, palpitaitons, heartburn, abdominal pain, melena, lower extremity edema, claudication, or rash.  All other systems reviewed and negative  General: Affect appropriate Healthy:  appears stated age HEENT: normal Neck supple with no adenopathy JVP normal no bruits no thyromegaly Lungs clear with no wheezing and good diaphragmatic motion Heart:  S1/S2 no murmur, no rub, gallop or click PMI normal Abdomen: benighn, BS positve, no tenderness, no AAA no bruit.  No HSM or HJR Distal pulses intact with no bruits No edema Neuro non-focal Skin warm and dry No muscular weakness   Current Outpatient Prescriptions  Medication Sig Dispense Refill  . aspirin 81 MG tablet Take 81 mg by mouth daily.      . Biotin 5000 MCG CAPS Take 1 capsule by mouth.    . Cholecalciferol ([employer]S VITAMIN D3) 1000 UNITS capsule Take 1 capsule (1,000 Units total) by mouth daily.    [name] loratadine (CLARITIN) 10 MG tablet Take 10 mg by mouth daily as needed.     . metoprolol succinate (TOPROL-XL) 25 MG 24 hr tablet Take 1 tablet  (25 mg total) by mouth daily. (Patient taking differently: Take 20 mg by mouth daily. ) 90 tablet 3  . ranitidine (ZANTAC) 150 MG tablet 1/2 tab bid prn     . torsemide (DEMADEX) 20 MG tablet TAKE 1 TABLET BY MOUTH DAILY 60 tablet 3   No current facility-administered medications for this visit.    Allergies  Ace inhibitors; Sulfamethoxazole-trimethoprim; and Warfarin sodium  Electrocardiogram: SR rate 71  LAD LBBB  2014  Today no change SR rate 80  LAD LBBB   Assessment and Plan

## 2014-07-22 NOTE — Assessment & Plan Note (Signed)
Compensated on medical Rx  EF by echo 2015 improved to 50-55%

## 2014-07-22 NOTE — Assessment & Plan Note (Signed)
Resolved maint NSR  Continue asa and beta blocker

## 2014-07-25 ENCOUNTER — Telehealth: Payer: Self-pay | Admitting: Cardiovascular Disease

## 2014-07-25 NOTE — Telephone Encounter (Signed)
Spoke with pt and she states that she takes Metoprolol Succinate 25mg  once daily and that she feels this medication is causing her to be tired and dizzy at times. Pt states these symptoms have been occuring on and off for a few years. Pt states that she forgot to mention this to [name] at her appt this week. Pt states that she would like to split this dose in half if [name] feels that this would be ok. Pt states that her BP and pulse have been running great, no values available. Will forward to [name] for review and advisement.

## 2014-07-25 NOTE — Telephone Encounter (Signed)
New problem   Pt want to cut back on her Metoprolol 25mg  heart medication and need to speak to nurse. Please call pt.

## 2014-07-25 NOTE — Telephone Encounter (Signed)
Ok to split beta blocker dose in half

## 2014-07-28 NOTE — Telephone Encounter (Signed)
Pt  Notified./cy 

## 2014-09-04 ENCOUNTER — Encounter: Payer: Self-pay | Admitting: Family Medicine

## 2014-09-11 ENCOUNTER — Telehealth: Payer: Self-pay | Admitting: *Deleted

## 2014-09-15 MED ORDER — TORSEMIDE 20 MG PO TABS: 20.0000 mg | ORAL_TABLET | Freq: Two times a day (BID) | ORAL | Status: DC | PRN

## 2014-09-15 NOTE — Telephone Encounter (Signed)
PT  TAKES  TORSEMIDE 20 MG  BID  PRN   NEW  SCRIPT  SENT  TO PHARMACY .Zack Seal

## 2014-10-14 ENCOUNTER — Other Ambulatory Visit: Payer: Self-pay | Admitting: Family Medicine

## 2014-10-14 DIAGNOSIS — I510 Cardiac septal defect, acquired: Secondary | ICD-10-CM

## 2014-10-14 DIAGNOSIS — M810 Age-related osteoporosis without current pathological fracture: Secondary | ICD-10-CM

## 2014-10-14 DIAGNOSIS — I480 Paroxysmal atrial fibrillation: Secondary | ICD-10-CM

## 2014-10-17 ENCOUNTER — Other Ambulatory Visit (INDEPENDENT_AMBULATORY_CARE_PROVIDER_SITE_OTHER): Payer: Medicare Other

## 2014-10-17 DIAGNOSIS — I510 Cardiac septal defect, acquired: Secondary | ICD-10-CM

## 2014-10-17 DIAGNOSIS — M810 Age-related osteoporosis without current pathological fracture: Secondary | ICD-10-CM | POA: Diagnosis not present

## 2014-10-17 DIAGNOSIS — I480 Paroxysmal atrial fibrillation: Secondary | ICD-10-CM | POA: Diagnosis not present

## 2014-10-17 LAB — COMPREHENSIVE METABOLIC PANEL
ALT: 13 U/L (ref 0–35)
AST: 21 U/L (ref 0–37)
Albumin: 3.8 g/dL (ref 3.5–5.2)
Alkaline Phosphatase: 82 U/L (ref 39–117)
BUN: 18 mg/dL (ref 6–23)
CO2: 33 mEq/L — ABNORMAL HIGH (ref 19–32)
Calcium: 9.5 mg/dL (ref 8.4–10.5)
Chloride: 100 mEq/L (ref 96–112)
Creatinine, Ser: 1.01 mg/dL (ref 0.40–1.20)
GFR: 55.82 mL/min — ABNORMAL LOW (ref >60.00)
Glucose, Bld: 93 mg/dL (ref 70–99)
Potassium: 4 mEq/L (ref 3.5–5.1)
Sodium: 140 mEq/L (ref 135–145)
Total Bilirubin: 0.5 mg/dL (ref 0.2–1.2)
Total Protein: 6.6 g/dL (ref 6.0–8.3)

## 2014-10-17 LAB — LIPID PANEL
Cholesterol: 225 mg/dL — ABNORMAL HIGH (ref 0–200)
HDL: 43.5 mg/dL (ref >39.00)
NonHDL: 181.5
Total CHOL/HDL Ratio: 5
Triglycerides: 238 mg/dL — ABNORMAL HIGH (ref 0.0–149.0)
VLDL: 47.6 mg/dL — ABNORMAL HIGH (ref 0.0–40.0)

## 2014-10-17 LAB — LDL CHOLESTEROL, DIRECT: Direct LDL: 56 mg/dL

## 2014-10-17 LAB — VITAMIN D 25 HYDROXY (VIT D DEFICIENCY, FRACTURES): VITD: 40.02 ng/mL (ref 30.00–100.00)

## 2014-10-17 LAB — TSH: TSH: 3.05 u[IU]/mL (ref 0.35–4.50)

## 2014-10-23 ENCOUNTER — Ambulatory Visit (INDEPENDENT_AMBULATORY_CARE_PROVIDER_SITE_OTHER): Payer: Medicare Other | Admitting: Family Medicine

## 2014-10-23 ENCOUNTER — Encounter: Payer: Self-pay | Admitting: Family Medicine

## 2014-10-23 VITALS — BP 112/80 | HR 90 | Temp 97.7°F | Ht 65.0 in | Wt 175.0 lb

## 2014-10-23 DIAGNOSIS — M810 Age-related osteoporosis without current pathological fracture: Secondary | ICD-10-CM

## 2014-10-23 DIAGNOSIS — I4891 Unspecified atrial fibrillation: Secondary | ICD-10-CM

## 2014-10-23 DIAGNOSIS — Z23 Encounter for immunization: Secondary | ICD-10-CM | POA: Diagnosis not present

## 2014-10-23 DIAGNOSIS — Z7189 Other specified counseling: Secondary | ICD-10-CM

## 2014-10-23 DIAGNOSIS — I480 Paroxysmal atrial fibrillation: Secondary | ICD-10-CM

## 2014-10-23 DIAGNOSIS — Z0000 Encounter for general adult medical examination without abnormal findings: Secondary | ICD-10-CM | POA: Diagnosis not present

## 2014-10-23 MED ORDER — METOPROLOL SUCCINATE ER 25 MG PO TB24: 12.5000 mg | ORAL_TABLET | Freq: Every day | ORAL | Status: DC

## 2014-10-23 NOTE — Progress Notes (Signed)
Pre visit review using our clinic review tool, if applicable. No additional management support is needed unless otherwise documented below in the visit note.  I have personally reviewed the Medicare Annual Wellness questionnaire and have noted 1. The patient's medical and social history 2. Their use of alcohol, tobacco or illicit drugs 3. Their current medications and supplements 4. The patient's functional ability including ADL's, fall risks, home safety risks and hearing or visual             impairment. 5. Diet and physical activities 6. Evidence for depression or mood disorders  The patients weight, height, BMI have been recorded in the chart and visual acuity is per eye clinic.  I have made referrals, counseling and provided education to the patient based review of the above and I have provided the pt with a written personalized care plan for preventive services.  Provider list updated- see scanned forms.  Routine anticipatory guidance given to patient.  See health maintenance.  Flu 2015 Shingles 2008 PNA 2008, updated 2016 Tetanus 2012 Colonoscopy NA due to age, d/w pt and she agrees.  Breast cancer screening- 08/2014, done DXA declined by patient.  She wasn't interested in treatment so this is reasonable.  Advance directive- husband designated if patient were incapacitated, then her son/daughter equally designated thereafter if needed.  Cognitive function addressed- see scanned forms- and if abnormal then additional documentation follows.   She is going to f/u with ortho next week about her L knee pain.    PMH and SH reviewed  Meds, vitals, and allergies reviewed.   ROS: See HPI.  Otherwise negative.    GEN: nad, alert and oriented HEENT: mucous membranes moist NECK: supple w/o LA CV: rrr. PULM: ctab, no inc wob ABD: soft, +bs EXT: no edema SKIN: no acute rash

## 2014-10-23 NOTE — Assessment & Plan Note (Signed)
Sounds to be RRR, TSH wnl, LDL at goal.  D/w pt about diet and exercise.  No change in meds.

## 2014-10-23 NOTE — Assessment & Plan Note (Signed)
She declined f/u DXA.

## 2014-10-23 NOTE — Patient Instructions (Signed)
Take care.  Glad to see you.  Don't change your meds.  I would get a flu shot each fall.   I'll await the orthopedic notes.

## 2014-10-23 NOTE — Assessment & Plan Note (Signed)
Flu 2015 Shingles 2008 PNA 2008, updated 2016 Tetanus 2012 Colonoscopy NA due to age, d/w pt and she agrees.  Breast cancer screening- 08/2014, done DXA declined by patient.  She wasn't interested in treatment so this is reasonable.  Advance directive- husband designated if patient were incapacitated, then her son/daughter equally designated thereafter if needed.  Cognitive function addressed- see scanned forms- and if abnormal then additional documentation follows.

## 2014-11-12 ENCOUNTER — Other Ambulatory Visit: Payer: Self-pay | Admitting: Family Medicine

## 2014-11-12 NOTE — Telephone Encounter (Signed)
Sent. Thanks.   

## 2014-11-12 NOTE — Telephone Encounter (Signed)
Received refill request electronically Last office visit 10/23/14 Medication is no longer on medication sheet Is it okay to refill?

## 2014-12-29 ENCOUNTER — Encounter: Payer: Self-pay | Admitting: Family Medicine

## 2014-12-29 ENCOUNTER — Ambulatory Visit (INDEPENDENT_AMBULATORY_CARE_PROVIDER_SITE_OTHER): Payer: Medicare Other | Admitting: Family Medicine

## 2014-12-29 VITALS — BP 108/68 | HR 88 | Temp 98.3°F | Wt 173.2 lb

## 2014-12-29 DIAGNOSIS — R05 Cough: Secondary | ICD-10-CM | POA: Diagnosis not present

## 2014-12-29 DIAGNOSIS — R059 Cough, unspecified: Secondary | ICD-10-CM

## 2014-12-29 DIAGNOSIS — I480 Paroxysmal atrial fibrillation: Secondary | ICD-10-CM | POA: Diagnosis not present

## 2014-12-29 MED ORDER — METOPROLOL SUCCINATE ER 25 MG PO TB24: 12.5000 mg | ORAL_TABLET | Freq: Every day | ORAL | Status: DC

## 2014-12-29 NOTE — Patient Instructions (Signed)
Try claritin and salt water gargles.  The cough should resolve. I would restart the metoprolol.  Take care.  Glad to see you.

## 2014-12-29 NOTE — Progress Notes (Signed)
Pre visit review using our clinic review tool, if applicable. No additional management support is needed unless otherwise documented below in the visit note.  Husband was recently in the hospital with GIB, out of hospital and doing better now.    She had been sick last week when husband was ill.  Facial pain, maxillary area.  A lot of post nasal drip.  Some cough still but better in the meantime.  She does better in the daytime, worse at night usually.  No FCNAV.  No ear pain. No sputum now.  Facial pain better now.  No ST now.  Voice is hoarse, she attributed to the cough.  Not gargling with salt water but had been using menthol lozenges.    She had tapered off her beta blocker prev.  Is off SSRI since "I didn't need it."  She has some fatigue that is long standing "but I've always been kind of laid back and lazy."  She has some days that are better than others, in terms of energy level.  No bleeding, no blood in stools.  She had some vertigo from cough medicine recently, resolved now.    Meds, vitals, and allergies reviewed.   ROS: See HPI.  Otherwise, noncontributory.  GEN: nad, alert and oriented HEENT: mucous membranes moist, tm w/o erythema, nasal exam w/o erythema, clear discharge noted,  OP with cobblestoning, sinuses not ttp NECK: supple w/o LA CV: rrr.   PULM: ctab, no inc wob EXT: no edema SKIN: no acute rash

## 2014-12-30 NOTE — Assessment & Plan Note (Signed)
H/o, I would restart the metoprolol, d/w pt.  She agrees.  Sounds to be RRR today.

## 2014-12-30 NOTE — Assessment & Plan Note (Signed)
Benign exam.  Ddx d/w pt.  Try claritin and salt water gargles.  The cough should resolve.

## 2015-01-08 ENCOUNTER — Ambulatory Visit (INDEPENDENT_AMBULATORY_CARE_PROVIDER_SITE_OTHER): Payer: Medicare Other

## 2015-01-08 DIAGNOSIS — Z23 Encounter for immunization: Secondary | ICD-10-CM

## 2015-06-04 ENCOUNTER — Encounter: Payer: Self-pay | Admitting: Family Medicine

## 2015-06-04 ENCOUNTER — Ambulatory Visit (INDEPENDENT_AMBULATORY_CARE_PROVIDER_SITE_OTHER): Payer: Medicare Other | Admitting: Family Medicine

## 2015-06-04 VITALS — BP 128/70 | HR 73 | Temp 97.5°F | Wt 173.8 lb

## 2015-06-04 DIAGNOSIS — I4891 Unspecified atrial fibrillation: Secondary | ICD-10-CM | POA: Diagnosis not present

## 2015-06-04 LAB — COMPREHENSIVE METABOLIC PANEL
ALT: 13 U/L (ref 0–35)
AST: 22 U/L (ref 0–37)
Albumin: 4.1 g/dL (ref 3.5–5.2)
Alkaline Phosphatase: 78 U/L (ref 39–117)
BUN: 16 mg/dL (ref 6–23)
CO2: 33 mEq/L — ABNORMAL HIGH (ref 19–32)
Calcium: 9.6 mg/dL (ref 8.4–10.5)
Chloride: 99 mEq/L (ref 96–112)
Creatinine, Ser: 0.97 mg/dL (ref 0.40–1.20)
GFR: 58.39 mL/min — ABNORMAL LOW (ref >60.00)
Glucose, Bld: 96 mg/dL (ref 70–99)
Potassium: 3.8 mEq/L (ref 3.5–5.1)
Sodium: 140 mEq/L (ref 135–145)
Total Bilirubin: 0.6 mg/dL (ref 0.2–1.2)
Total Protein: 7 g/dL (ref 6.0–8.3)

## 2015-06-04 LAB — CBC WITH DIFFERENTIAL/PLATELET
Basophils Absolute: 0.1 10*3/uL (ref 0.0–0.1)
Basophils Relative: 0.7 % (ref 0.0–3.0)
Eosinophils Absolute: 0.2 10*3/uL (ref 0.0–0.7)
Eosinophils Relative: 3.2 % (ref 0.0–5.0)
HCT: 38.7 % (ref 36.0–46.0)
Hemoglobin: 12.7 g/dL (ref 12.0–15.0)
Lymphocytes Relative: 30.6 % (ref 12.0–46.0)
Lymphs Abs: 2.2 10*3/uL (ref 0.7–4.0)
MCHC: 32.9 g/dL (ref 30.0–36.0)
MCV: 91.6 fl (ref 78.0–100.0)
Monocytes Absolute: 0.4 10*3/uL (ref 0.1–1.0)
Monocytes Relative: 6.3 % (ref 3.0–12.0)
Neutro Abs: 4.2 10*3/uL (ref 1.4–7.7)
Neutrophils Relative %: 59.2 % (ref 43.0–77.0)
Platelets: 277 10*3/uL (ref 150.0–400.0)
RBC: 4.23 Mil/uL (ref 3.87–5.11)
RDW: 13.7 % (ref 11.5–15.5)
WBC: 7.1 10*3/uL (ref 4.0–10.5)

## 2015-06-04 LAB — TSH: TSH: 1.26 u[IU]/mL (ref 0.35–4.50)

## 2015-06-04 NOTE — Patient Instructions (Signed)
We'll contact you with your lab report. Stay on the higher dose of metoprolol for now and keep the the cardiology clinic appointment.   I don't think you have to move that up as long as you are feeling better.  Take care.  Glad to see you.

## 2015-06-04 NOTE — Progress Notes (Signed)
Pre visit review using our clinic review tool, if applicable. No additional management support is needed unless otherwise documented below in the visit note.  She went up on her BB, up to 25mg  a day, about 10 days ago.  She was having the following sx before the BB increase:  she had been feeling weak and "dizzy" but the room didn't spin.  BP was as low as 106/60.   When her BP gets low, she'll get fatigued.  She didn't feel palpitations. She does occ feel some shortness of breath when she has an episode.  She doesn't have daily sx.  She felt some better in the last few days.  She is sedentary on a "bad day" but o/w her activity is at baseline.  No CP.  She does get lightheaded.  Her HR has been up to 130, better on the higher dose of medicine.   PMH and SH reviewed  ROS: See HPI, otherwise noncontributory.  Meds, vitals, and allergies reviewed.   nad ncat Mmm Neck supple, no LA IRR- ectopy noted, not tachy ctab abd soft, not ttp Ext w/o edema  EKG w/o sig change from prev.  D/w pt.  Labs pending.

## 2015-06-04 NOTE — Assessment & Plan Note (Signed)
Sx likely from A fib.  She feels better on higher dose of BB.  She had what I thought was AF on exam, but PVC noted on EKG.   Would continue 25mg  metoprolol for now, as she feels better.  EKG w/o sig change from prev.  D/w pt.  Labs pending. See notes on labs when reported.  I don't think she needs to f/u with cards ASAP, but can keep her routine appointment.

## 2015-06-12 ENCOUNTER — Encounter: Payer: Self-pay | Admitting: Family Medicine

## 2015-06-12 ENCOUNTER — Ambulatory Visit (INDEPENDENT_AMBULATORY_CARE_PROVIDER_SITE_OTHER): Payer: Medicare Other | Admitting: Family Medicine

## 2015-06-12 VITALS — BP 102/62 | HR 80 | Temp 98.5°F | Wt 168.5 lb

## 2015-06-12 DIAGNOSIS — R197 Diarrhea, unspecified: Secondary | ICD-10-CM | POA: Diagnosis not present

## 2015-06-12 LAB — BASIC METABOLIC PANEL
BUN: 22 mg/dL (ref 6–23)
CO2: 32 mEq/L (ref 19–32)
Calcium: 9 mg/dL (ref 8.4–10.5)
Chloride: 96 mEq/L (ref 96–112)
Creatinine, Ser: 1.23 mg/dL — ABNORMAL HIGH (ref 0.40–1.20)
GFR: 44.39 mL/min — ABNORMAL LOW (ref >60.00)
Glucose, Bld: 99 mg/dL (ref 70–99)
Potassium: 4 mEq/L (ref 3.5–5.1)
Sodium: 137 mEq/L (ref 135–145)

## 2015-06-12 MED ORDER — ONDANSETRON 4 MG PO TBDP: 4.0000 mg | ORAL_TABLET | Freq: Three times a day (TID) | ORAL | Status: DC | PRN

## 2015-06-12 NOTE — Assessment & Plan Note (Signed)
There are reported cases of diarrhea in the community, ie presumed viral diarrhea source.  Okay for outpatient f/u.  Rest and fluids, okay to skip torsemide for 1-2 days depending on her energy level, BP and lack of edema.  Recheck lytes today.  Update me as needed.  zofran prn in meantime.  Not having diarrhea currently.  Benign abd exam.

## 2015-06-12 NOTE — Patient Instructions (Addendum)
Take zofran as needed.   Drink sips of fluids in the meantime.   Skip the torsemide tomorrow and possibly Sunday if needed.   Go to the lab on the way out.  We'll contact you with your lab report. Keep washing your hands.   Take care.  Glad to see you.

## 2015-06-12 NOTE — Progress Notes (Signed)
Pre visit review using our clinic review tool, if applicable. No additional management support is needed unless otherwise documented below in the visit note.  Sx started about 3 days ago.  Initially with diarrhea "all night."  Started taking imodium.  Minimal solid intake.  Good PO liquids.  No vomiting, with taking zofran- that helps some. Tmax 99.5. No abd pain.  No blood in stool.  Some cough and congestion intermittently.  No ST.  No ear pain.  Fatigue.   No diarrhea in the meantime.  Still passing gas.   Has still been on torsemide.   Still with some aches and chills.  No sick contacts.   No atypical foods.    Meds, vitals, and allergies reviewed.   ROS: See HPI.  Otherwise, noncontributory.  GEN: nad, alert and oriented, she doesn't appear ill but looks like she doesn't feel like her baseline self.  HEENT: mucous membranes moist, tm w/o erythema, nasal exam w/o erythema, scant clear discharge noted,  OP without cobblestoning, mmm NECK: supple w/o LA CV: rrr.   PULM: ctab, no inc wob ABD soft, not ttp, no rebound EXT: no edema SKIN: no acute rash, normal skin turgor

## 2015-06-15 ENCOUNTER — Telehealth: Payer: Self-pay

## 2015-06-15 NOTE — Telephone Encounter (Signed)
Pt left v/m; pt was seen 06/12/15 and cough and congestion continues; pt is allergic to OTC cough med. Pt wants to get rid of cough.pt wants to know what Dr Para March can give pt to break up congestion in chest and cough. Pt request [email or web address]

## 2015-06-15 NOTE — Telephone Encounter (Signed)
She can try plain mucinex, not mucinex D, and see if that helps.  That may be the best option.  Thanks.

## 2015-06-15 NOTE — Telephone Encounter (Signed)
Patient advised.

## 2015-06-16 ENCOUNTER — Encounter: Payer: Self-pay | Admitting: Family Medicine

## 2015-06-16 ENCOUNTER — Encounter: Payer: Self-pay | Admitting: Physician Assistant

## 2015-06-17 ENCOUNTER — Encounter: Payer: Self-pay | Admitting: Physician Assistant

## 2015-06-17 NOTE — Telephone Encounter (Signed)
Per [name], [facility] to cut demedex to 10 mg and f/u BMET. Called patient today and she has an appointment with [name] PA tomorrow. Patient stated that she cannot tolerated demedex at all, and will be reevaluated at her appointment tomorrow. Confirmed patient 's appointment for tomorrow. Patient verbalized understanding.

## 2015-06-17 NOTE — Progress Notes (Signed)
Cardiology Office Note:    Date:  06/18/2015   ID:  [name], DOB 03/10/1931, MRN [id number]  PCP:  Catherine Givens, MD  Cardiologist:  Dr. Charlton Eaton   Electrophysiologist:  n/a  Chief Complaint  Patient presents with  . BP low    History of Present Illness:     [name] is a 82 y.o. female with a hx of PAF, NICM, systolic HF, LBBB, prior spontaneous retroperitoneal bleed on Coumadin, LE edema. LHC in 2010 neg for CAD.  EF as low as 20% in the past.  Last Echo in 2015 with normal LVF with EF 50-55%.  Last seen by Dr. Charlton Eaton 4/16.    OV with PCP 06/12/15 for diarrhea. Dx with viral gastroenteritis.  She also contacted me with c/o dizziness, fatigue and dry mouth (via MyChart). Review of chart: SCr 0.7 >> 1.23.  I recommended she take Torsemide only prn for weight gain, edema, inc dyspnea.    She is here with her husband.  She has felt poorly for the last week.  She denies dyspnea, chest pain, orthopnea, PND, edema.  She denies syncope.  She is dizzy.  She has felt near syncopal.  She feels a little better since stopping the Torsemide.  She has a nonproductive cough. She has been nauseated and taking prn Zofran.     Past Medical History  Diagnosis Date  . Atrial fibrillation (HCC)     h/o sig bleed on coumadin  . GERD (gastroesophageal reflux disease)   . Sciatica     Right  . Insomnia      off chronic ambien  as of 10/11, rare/episodic use since then  DCM/CHF  . Depression     previously on zoloft  . Osteoporosis     on DXA 05/2010  . H/O: hysterectomy 1979    (ovaries left intact)  . CHF (congestive heart failure) (HCC)   . Diverticulosis     descending and sigmoid colon--Dr. Jarold Eaton    Past Surgical History  Procedure Laterality Date  . Partial hysterectomy  1979    ovaries intact    Current Medications: Outpatient Prescriptions Prior to Visit  Medication Sig Dispense Refill  . aspirin 81 MG tablet Take 81 mg by mouth daily.      .  metoprolol succinate (TOPROL-XL) 25 MG 24 hr tablet Take 25 mg by mouth daily.    [name] omeprazole (PRILOSEC OTC) 20 MG tablet Take 20 mg by mouth daily as needed (HEArtburn).     . ranitidine (ZANTAC) 150 MG tablet Take 150 mg by mouth every morning.    . B Complex-C (SUPER B COMPLEX PO) Take 1 tablet by mouth daily.    . Biotin 5000 MCG CAPS Take 1 capsule by mouth.    . Cholecalciferol ([employer] VITAMIN D3) 1000 UNITS capsule Take 1 capsule (1,000 Units total) by mouth daily.    . diphenhydrAMINE (BENADRYL) 25 MG tablet Take 12.5 mg by mouth every 6 (six) hours as needed.    . loratadine (CLARITIN) 10 MG tablet Take 10 mg by mouth daily as needed.     . naproxen sodium (ANAPROX) 220 MG tablet Take 220 mg by mouth 2 (two) times daily with a meal.    . ondansetron (ZOFRAN ODT) 4 MG disintegrating tablet Take 1 tablet (4 mg total) by mouth every 8 (eight) hours as needed for nausea or vomiting. 20 tablet 0  . torsemide (DEMADEX) 20 MG tablet Take 1 tablet (20 mg  total) by mouth 2 (two) times daily as needed. 60 tablet 6   No facility-administered medications prior to visit.     Allergies:   Ace inhibitors; Codeine; Delsym; Sulfamethoxazole-trimethoprim; and Warfarin sodium   Social History   Social History  . Marital Status: Married    Spouse Name: N/A  . Number of Children: 2  . Years of Education: N/A   Occupational History  . Retired    Social History Main Topics  . Smoking status: Never Smoker   . Smokeless tobacco: Never Used  . Alcohol Use: 0.0 oz/week    0 Standard drinks or equivalent per week     Comment: very rare-beer or wine  . Drug Use: No  . Sexual Activity: Not Asked   Other Topics Concern  . None   Social History Narrative   Retired, former [employer]   Married, 1953   Lives with husband   2 grown children, one in [place], one out of state   Tobacco Use - No.    Alcohol Use - yes occ beer or wine   Drug Use - no   teaches Sunday school, reading      Family History:  The patient's family history includes Cancer in her father; Colon cancer in her father; Tuberculosis in her mother. There is no history of Breast cancer.   ROS:   Please see the history of present illness.    ROS All other systems reviewed and are negative.   Physical Exam:    VS:  BP 97/64 mmHg  Pulse 132  Ht 5\' 5"  (1.651 m)  Wt 168 lb 6.4 oz (76.386 kg)  BMI 28.02 kg/m2  SpO2 98%   GEN: Well nourished, well developed, in no acute distress HEENT: normal Neck: no JVD, no masses Cardiac: Normal S1/S2, irreg irreg rhythm; no murmurs,   no edema   Respiratory:  clear to auscultation bilaterally; no wheezing, rhonchi or rales GI: soft, nontender  MS: no deformity or atrophy Skin: warm and dry  Neuro:    no focal deficits  Psych: Alert and oriented x 3, normal affect  Wt Readings from Last 3 Encounters:  06/18/15 168 lb 6.4 oz (76.386 kg)  06/12/15 168 lb 8 oz (76.431 kg)  06/04/15 173 lb 12 oz (78.812 kg)      Studies/Labs Reviewed:     EKG:  EKG is  ordered today.  The ekg ordered today demonstrates AFib with RVR, HR 132, LBBB   Recent Labs: 06/04/2015: ALT 13; Hemoglobin 12.7; Platelets 277.0; TSH 1.26 06/12/2015: BUN 22; Creatinine, Ser 1.23*; Potassium 4.0; Sodium 137   Recent Lipid Panel    Component Value Date/Time   CHOL 225* 10/17/2014 0836   TRIG 238.0* 10/17/2014 0836   HDL 43.50 10/17/2014 0836   CHOLHDL 5 10/17/2014 0836   VLDL 47.6* 10/17/2014 0836   LDLCALC 149* 08/19/2013 1042   LDLDIRECT 56.0 10/17/2014 0836    Additional studies/ records that were reviewed today include:   Echo 10/15 EF 50-55%, no RWMA, Gr 1 DD, mild AI  LHC 04/2008 IMPRESSION: 1. No angiographic evidence of coronary artery disease. 2. Nonischemic cardiomyopathy. 3. Global left ventricular systolic dysfunction.  EF 15-20%    ASSESSMENT:     1. Atrial fibrillation with RVR (HCC)   2. Chronic systolic CHF (congestive heart failure) (HCC)   3. LBBB  (left bundle branch block)     PLAN:     In order of problems listed above:  1. AFib  with RVR - Patient is significantly symptomatic with AFib.  She has a prior hx of RTP bleed in 2010.  Reviewed records.  She had a cath prior to this.  I reviewed her case with Dr. Dietrich Pates (DOD).  We think she is likely a good candidate for anticoagulation. She would likely be able to take Pradaxa in light of the availability of a reversal agent in the hospital.  Today, she needs to be admitted for rate control.  There are no beds. She will be sent to the ED.  Start with IVF hydration for now and attempt IV rate control with Diltiazem.  Our team will see in the ED.  Will start with IV Heparin and then transition to Oral anticoagulation.  We could try TEE-DCCV with addition of antiarrhythmic and possible DC of oral anticoagulation 4 weeks post DCCV if bleeding risk continues to be a huge concern.  TSH in 2/17 normal.    2. Chronic Systolic CHF - Hx of NICM. EF has recovered in the past.  EF 50-55% by echo in 2015.  No evidence of volume excess.  Will need repeat Echo once HR better controlled.   3. LBBB - No CAD on cath in 2010.  Chronic.      Medication Adjustments/Labs and Tests Ordered: Current medicines are reviewed at length with the patient today.  Concerns regarding medicines are outlined above.  Medication changes, Labs and Tests ordered today are outlined in the Patient Instructions noted below. Patient Instructions  Medication Instructions:   NONE ORDER TODAY  If you need a refill on your cardiac medications before your next appointment, please call your pharmacy.  Labwork:  NONE ORDER TODAY  Testing/Procedures: NONE ORDER TODAY  Follow-Up:  YOU ARE BEING SENT TO OUR EMERGENCY  ROOM DEPARTMENT FOR FURTHER EVALUATION  Any Other Special Instructions Will Be Listed Below (If Applicable).                                                          Signed, [name], PA-C  06/18/2015 9:47 AM     [facility] [address] [place], [place], [place]  [address] Phone: [phone]; Fax: [phone]

## 2015-06-18 ENCOUNTER — Encounter (HOSPITAL_COMMUNITY): Payer: Self-pay | Admitting: Emergency Medicine

## 2015-06-18 ENCOUNTER — Ambulatory Visit (INDEPENDENT_AMBULATORY_CARE_PROVIDER_SITE_OTHER): Payer: Medicare Other | Admitting: Physician Assistant

## 2015-06-18 ENCOUNTER — Observation Stay (HOSPITAL_COMMUNITY): Admission: AD | Admit: 2015-06-18 | Payer: Medicare Other | Source: Ambulatory Visit | Admitting: Internal Medicine

## 2015-06-18 ENCOUNTER — Encounter: Payer: Self-pay | Admitting: Physician Assistant

## 2015-06-18 ENCOUNTER — Inpatient Hospital Stay (HOSPITAL_COMMUNITY)
Admission: EM | Admit: 2015-06-18 | Discharge: 2015-06-24 | DRG: 309 | Disposition: A | Discharge status: Home / Self Care | Payer: Medicare Other | Attending: Internal Medicine | Admitting: Internal Medicine

## 2015-06-18 ENCOUNTER — Emergency Department (HOSPITAL_COMMUNITY): Payer: Medicare Other

## 2015-06-18 VITALS — BP 97/64 | HR 132 | Ht 65.0 in | Wt 168.4 lb

## 2015-06-18 DIAGNOSIS — S301XXA Contusion of abdominal wall, initial encounter: Secondary | ICD-10-CM | POA: Diagnosis present

## 2015-06-18 DIAGNOSIS — I4891 Unspecified atrial fibrillation: Secondary | ICD-10-CM | POA: Diagnosis not present

## 2015-06-18 DIAGNOSIS — I447 Left bundle-branch block, unspecified: Secondary | ICD-10-CM

## 2015-06-18 DIAGNOSIS — I5022 Chronic systolic (congestive) heart failure: Secondary | ICD-10-CM | POA: Diagnosis present

## 2015-06-18 DIAGNOSIS — R109 Unspecified abdominal pain: Secondary | ICD-10-CM

## 2015-06-18 DIAGNOSIS — I481 Persistent atrial fibrillation: Secondary | ICD-10-CM

## 2015-06-18 DIAGNOSIS — I429 Cardiomyopathy, unspecified: Secondary | ICD-10-CM | POA: Diagnosis present

## 2015-06-18 DIAGNOSIS — M810 Age-related osteoporosis without current pathological fracture: Secondary | ICD-10-CM | POA: Diagnosis present

## 2015-06-18 DIAGNOSIS — R002 Palpitations: Secondary | ICD-10-CM | POA: Diagnosis not present

## 2015-06-18 DIAGNOSIS — I9742 Intraoperative hemorrhage and hematoma of a circulatory system organ or structure complicating other procedure: Secondary | ICD-10-CM | POA: Diagnosis not present

## 2015-06-18 DIAGNOSIS — R059 Cough, unspecified: Secondary | ICD-10-CM | POA: Diagnosis present

## 2015-06-18 DIAGNOSIS — R05 Cough: Secondary | ICD-10-CM | POA: Diagnosis present

## 2015-06-18 DIAGNOSIS — Z7982 Long term (current) use of aspirin: Secondary | ICD-10-CM

## 2015-06-18 DIAGNOSIS — I480 Paroxysmal atrial fibrillation: Secondary | ICD-10-CM | POA: Diagnosis not present

## 2015-06-18 DIAGNOSIS — Y848 Other medical procedures as the cause of abnormal reaction of the patient, or of later complication, without mention of misadventure at the time of the procedure: Secondary | ICD-10-CM | POA: Diagnosis not present

## 2015-06-18 DIAGNOSIS — K219 Gastro-esophageal reflux disease without esophagitis: Secondary | ICD-10-CM | POA: Diagnosis present

## 2015-06-18 DIAGNOSIS — I5042 Chronic combined systolic (congestive) and diastolic (congestive) heart failure: Secondary | ICD-10-CM | POA: Diagnosis present

## 2015-06-18 HISTORY — DX: Unspecified atrial fibrillation: I48.91

## 2015-06-18 HISTORY — DX: Unspecified osteoarthritis, unspecified site: M19.90

## 2015-06-18 HISTORY — DX: Adverse effect of unspecified anesthetic, initial encounter: T41.45XA

## 2015-06-18 LAB — CBC WITH DIFFERENTIAL/PLATELET
Basophils Absolute: 0 10*3/uL (ref 0.0–0.1)
Basophils Relative: 0 %
Eosinophils Absolute: 0 10*3/uL (ref 0.0–0.7)
Eosinophils Relative: 0 %
HCT: 41.2 % (ref 36.0–46.0)
Hemoglobin: 13.8 g/dL (ref 12.0–15.0)
Lymphocytes Relative: 33 %
Lymphs Abs: 2.3 10*3/uL (ref 0.7–4.0)
MCH: 30.7 pg (ref 26.0–34.0)
MCHC: 33.5 g/dL (ref 30.0–36.0)
MCV: 91.6 fL (ref 78.0–100.0)
Monocytes Absolute: 0.5 10*3/uL (ref 0.1–1.0)
Monocytes Relative: 7 %
Neutro Abs: 4.3 10*3/uL (ref 1.7–7.7)
Neutrophils Relative %: 60 %
Platelets: 235 10*3/uL (ref 150–400)
RBC: 4.5 MIL/uL (ref 3.87–5.11)
RDW: 13 % (ref 11.5–15.5)
WBC: 7 10*3/uL (ref 4.0–10.5)

## 2015-06-18 LAB — COMPREHENSIVE METABOLIC PANEL
ALT: 26 U/L (ref 14–54)
AST: 38 U/L (ref 15–41)
Albumin: 3.4 g/dL — ABNORMAL LOW (ref 3.5–5.0)
Alkaline Phosphatase: 68 U/L (ref 38–126)
Anion gap: 13 (ref 5–15)
BUN: 11 mg/dL (ref 6–20)
CO2: 28 mmol/L (ref 22–32)
Calcium: 9.1 mg/dL (ref 8.9–10.3)
Chloride: 98 mmol/L — ABNORMAL LOW (ref 101–111)
Creatinine, Ser: 1.21 mg/dL — ABNORMAL HIGH (ref 0.44–1.00)
GFR calc Af Amer: 47 mL/min — ABNORMAL LOW (ref >60)
GFR calc non Af Amer: 41 mL/min — ABNORMAL LOW (ref >60)
Glucose, Bld: 118 mg/dL — ABNORMAL HIGH (ref 65–99)
Potassium: 3.1 mmol/L — ABNORMAL LOW (ref 3.5–5.1)
Sodium: 139 mmol/L (ref 135–145)
Total Bilirubin: 0.9 mg/dL (ref 0.3–1.2)
Total Protein: 6.2 g/dL — ABNORMAL LOW (ref 6.5–8.1)

## 2015-06-18 LAB — I-STAT TROPONIN, ED: Troponin i, poc: 0 ng/mL (ref 0.00–0.08)

## 2015-06-18 LAB — BRAIN NATRIURETIC PEPTIDE: B Natriuretic Peptide: 284 pg/mL — ABNORMAL HIGH (ref 0.0–100.0)

## 2015-06-18 MED ORDER — DILTIAZEM HCL 100 MG IV SOLR
5.0000 mg/h | INTRAVENOUS | Status: DC
  Administered 2015-06-18 – 2015-06-19 (×2): 5 mg/h via INTRAVENOUS
  Filled 2015-06-18 (×2): qty 100

## 2015-06-18 MED ORDER — POTASSIUM CHLORIDE CRYS ER 20 MEQ PO TBCR
40.0000 meq | EXTENDED_RELEASE_TABLET | Freq: Once | ORAL | Status: AC
  Administered 2015-06-18: 40 meq via ORAL
  Filled 2015-06-18: qty 2

## 2015-06-18 MED ORDER — FAMOTIDINE 20 MG PO TABS
10.0000 mg | ORAL_TABLET | Freq: Every day | ORAL | Status: DC
  Administered 2015-06-19 – 2015-06-24 (×6): 10 mg via ORAL
  Filled 2015-06-18 (×7): qty 1

## 2015-06-18 MED ORDER — HEPARIN BOLUS VIA INFUSION
2000.0000 [IU] | Freq: Once | INTRAVENOUS | Status: AC
  Administered 2015-06-18: 2000 [IU] via INTRAVENOUS
  Filled 2015-06-18: qty 2000

## 2015-06-18 MED ORDER — OMEPRAZOLE MAGNESIUM 20 MG PO TBEC: 20.0000 mg | DELAYED_RELEASE_TABLET | Freq: Every day | ORAL | Status: DC | PRN

## 2015-06-18 MED ORDER — ONDANSETRON 4 MG PO TBDP: 4.0000 mg | ORAL_TABLET | Freq: Three times a day (TID) | ORAL | Status: DC | PRN

## 2015-06-18 MED ORDER — FAMOTIDINE 20 MG PO TABS: 10.0000 mg | ORAL_TABLET | Freq: Every day | ORAL | Status: DC | PRN

## 2015-06-18 MED ORDER — SODIUM CHLORIDE 0.9 % IV SOLN
1.0000 g | Freq: Once | INTRAVENOUS | Status: AC
  Administered 2015-06-18: 1 g via INTRAVENOUS
  Filled 2015-06-18: qty 10

## 2015-06-18 MED ORDER — ACETAMINOPHEN 325 MG PO TABS
650.0000 mg | ORAL_TABLET | ORAL | Status: DC | PRN
  Administered 2015-06-18 – 2015-06-23 (×6): 650 mg via ORAL
  Filled 2015-06-18 (×7): qty 2

## 2015-06-18 MED ORDER — SODIUM CHLORIDE 0.9 % IV BOLUS (SEPSIS): 500.0000 mL | Freq: Once | INTRAVENOUS | Status: DC

## 2015-06-18 MED ORDER — ONDANSETRON HCL 4 MG/2ML IJ SOLN: 4.0000 mg | Freq: Four times a day (QID) | INTRAMUSCULAR | Status: DC | PRN

## 2015-06-18 MED ORDER — ENSURE ENLIVE PO LIQD
237.0000 mL | Freq: Two times a day (BID) | ORAL | Status: DC
  Administered 2015-06-20 – 2015-06-24 (×6): 237 mL via ORAL

## 2015-06-18 MED ORDER — HEPARIN (PORCINE) IN NACL 100-0.45 UNIT/ML-% IJ SOLN
1200.0000 [IU]/h | INTRAMUSCULAR | Status: DC
  Administered 2015-06-18: 1000 [IU]/h via INTRAVENOUS
  Filled 2015-06-18 (×2): qty 250

## 2015-06-18 MED ORDER — METOPROLOL SUCCINATE ER 25 MG PO TB24
25.0000 mg | ORAL_TABLET | Freq: Every day | ORAL | Status: DC
  Administered 2015-06-19 – 2015-06-21 (×3): 25 mg via ORAL
  Filled 2015-06-18 (×4): qty 1

## 2015-06-18 MED ORDER — METOPROLOL SUCCINATE ER 25 MG PO TB24: 25.0000 mg | ORAL_TABLET | Freq: Every day | ORAL | Status: DC

## 2015-06-18 MED ORDER — POTASSIUM CHLORIDE CRYS ER 20 MEQ PO TBCR
40.0000 meq | EXTENDED_RELEASE_TABLET | Freq: Once | ORAL | Status: AC
Start: 2015-06-19 — End: 2015-06-18
  Administered 2015-06-18: 40 meq via ORAL
  Filled 2015-06-18: qty 2

## 2015-06-18 MED ORDER — PANTOPRAZOLE SODIUM 40 MG PO TBEC: 40.0000 mg | DELAYED_RELEASE_TABLET | Freq: Every day | ORAL | Status: DC | PRN

## 2015-06-18 MED ORDER — DIPHENHYDRAMINE HCL 50 MG/ML IJ SOLN: 25.0000 mg | Freq: Once | INTRAMUSCULAR | Status: DC

## 2015-06-18 MED ORDER — SODIUM CHLORIDE 0.9 % IV BOLUS (SEPSIS)
500.0000 mL | Freq: Once | INTRAVENOUS | Status: AC
  Administered 2015-06-18: 500 mL via INTRAVENOUS

## 2015-06-18 MED ORDER — FAMOTIDINE 20 MG PO TABS: 10.0000 mg | ORAL_TABLET | Freq: Every day | ORAL | Status: DC

## 2015-06-18 NOTE — ED Provider Notes (Signed)
CSN: [id number]     Arrival date & time 06/18/15  1002 History   First MD Initiated Contact with Patient 06/18/15 1031     Chief Complaint  Patient presents with  . Palpitations      Patient is a 82 y.o. female presenting with palpitations.  Palpitations  Patient presents with shortness of breath, palpitations but no chest pain for the last several days.  Been feeling fatigued.  Went to her cardiologist's morning and found her in atrial fibrillation with rapid ventricular response.  Since the emergency room for further evaluation and treatment.  Patient is not aware that she has had atrial fibrillation in the past although her records indicate this has occurred back in 2010.  Patient has not been on any anticoagulants because she had a GI bleed.  Patient denies any chest pain. Past Medical History  Diagnosis Date  . Atrial fibrillation (HCC)     h/o sig bleed on coumadin  . GERD (gastroesophageal reflux disease)   . Sciatica     Right  . Insomnia      off chronic ambien  as of 10/11, rare/episodic use since then  DCM/CHF  . Depression     previously on zoloft  . Osteoporosis     on DXA 05/2010  . H/O: hysterectomy 1979    (ovaries left intact)  . CHF (congestive heart failure) (HCC)   . Diverticulosis     descending and sigmoid colon--Dr. Jarold Motto   Past Surgical History  Procedure Laterality Date  . Partial hysterectomy  1979    ovaries intact   Family History  Problem Relation Age of Onset  . Cancer Father   . Colon cancer Father     possible colon cancer  . Tuberculosis Mother   . Breast cancer Neg Hx    Social History  Substance Use Topics  . Smoking status: Never Smoker   . Smokeless tobacco: Never Used  . Alcohol Use: 0.0 oz/week    0 Standard drinks or equivalent per week     Comment: very rare-beer or wine   OB History    No data available     Review of Systems  Unable to perform ROS: Acuity of condition  Cardiovascular: Positive for palpitations.       Allergies  Ace inhibitors; Codeine; Delsym; Sulfamethoxazole-trimethoprim; and Warfarin sodium  Home Medications   Prior to Admission medications   Medication Sig Start Date End Date Taking? Authorizing Provider  acetaminophen (TYLENOL) 650 MG CR tablet Take 650 mg by mouth every 8 (eight) hours as needed for pain.   Yes Historical Provider, MD  aspirin 81 MG tablet Take 81 mg by mouth daily.     Yes Historical Provider, MD  metoprolol succinate (TOPROL-XL) 25 MG 24 hr tablet Take 25 mg by mouth daily.   Yes Historical Provider, MD  omeprazole (PRILOSEC OTC) 20 MG tablet Take 20 mg by mouth daily as needed (HEArtburn).    Yes Historical Provider, MD  ondansetron (ZOFRAN-ODT) 4 MG disintegrating tablet Take 4 mg by mouth every 8 (eight) hours as needed for nausea or vomiting.   Yes Historical Provider, MD  pseudoephedrine (SUDAFED) 30 MG tablet Take 30 mg by mouth every 4 (four) hours as needed for congestion.   Yes Historical Provider, MD  ranitidine (ZANTAC) 150 MG tablet Take 150 mg by mouth daily as needed for heartburn.    Yes Historical Provider, MD  torsemide (DEMADEX) 20 MG tablet Take 20 mg by mouth daily as  needed (weight gain 3 lbs or more, edema). Reported on 06/18/2015   Yes Historical Provider, MD   BP 100/71 mmHg  Pulse 135  Temp(Src) 97.8 F (36.6 C) (Oral)  Resp 16  Wt 168 lb 4 oz (76.318 kg)  SpO2 99% Physical Exam  Constitutional: She is oriented to person, place, and time. She appears well-developed and well-nourished. No distress.  HENT:  Head: Normocephalic and atraumatic.  Eyes: Pupils are equal, round, and reactive to light.  Neck: Normal range of motion.  Cardiovascular: Intact distal pulses.  An irregularly irregular rhythm present. Tachycardia present.   Pulmonary/Chest: No respiratory distress.  Abdominal: Normal appearance. She exhibits no distension.  Musculoskeletal: Normal range of motion.  Neurological: She is alert and oriented to person,  place, and time. No cranial nerve deficit.  Skin: Skin is warm and dry. No rash noted.  Psychiatric: She has a normal mood and affect. Her behavior is normal.  Nursing note and vitals reviewed.   ED Course  Procedures (including critical care time) Medications  potassium chloride SA (K-DUR,KLOR-CON) CR tablet 40 mEq (not administered)  potassium chloride SA (K-DUR,KLOR-CON) CR tablet 40 mEq (not administered)  potassium chloride SA (K-DUR,KLOR-CON) CR tablet 40 mEq (not administered)  calcium gluconate 1 g in sodium chloride 0.9 % 100 mL IVPB (0 g Intravenous Stopped 06/18/15 1135)  sodium chloride 0.9 % bolus 500 mL (0 mLs Intravenous Stopped 06/18/15 1200)    Labs Review Labs Reviewed  COMPREHENSIVE METABOLIC PANEL - Abnormal; Notable for the following:    Potassium 3.1 (*)    Chloride 98 (*)    Glucose, Bld 118 (*)    Creatinine, Ser 1.21 (*)    Total Protein 6.2 (*)    Albumin 3.4 (*)    GFR calc non Af Amer 41 (*)    GFR calc Af Amer 47 (*)    All other components within normal limits  BRAIN NATRIURETIC PEPTIDE - Abnormal; Notable for the following:    B Natriuretic Peptide 284.0 (*)    All other components within normal limits  CBC WITH DIFFERENTIAL/PLATELET  Rosezena Sensor, ED    Imaging Review Dg Chest Portable 1 View  06/18/2015  CLINICAL DATA:  Shortness of breath for 10 days. EXAM: PORTABLE CHEST 1 VIEW COMPARISON:  PA and lateral chest 05/17/2008. FINDINGS: The lungs are clear. Heart size is normal. No pneumothorax or pleural effusion. IMPRESSION: No acute disease. Electronically Signed   By: Drusilla Kanner M.D.   On: 06/18/2015 11:25   I have personally reviewed and evaluated these images and lab results as part of my medical decision-making.   EKG Interpretation   Date/Time:  Thursday June 18 2015 10:10:42 EST Ventricular Rate:  150 PR Interval:    QRS Duration: 128 QT Interval:  350 QTC Calculation: 553 R Axis:   -82 Text Interpretation:  Undetermined  rhythm Left axis deviation Non-specific  intra-ventricular conduction block Cannot rule out Septal infarct , age  undetermined T wave abnormality, consider lateral ischemia Abnormal ECG  Confirmed by Almira Phetteplace  MD, Dylynn Ketner ([id number]) on 06/18/2015 10:29:11 AM     After calcium patient's heart rate slowed to 95 205.  Cardiology was consulted and will come evaluate. MDM   Final diagnoses:  Atrial fibrillation with RVR (HCC)        Nelva Nay, MD 06/18/15 1309

## 2015-06-18 NOTE — ED Notes (Signed)
Patient seen at Doctors office today. States having palpitations intermittently for a while and feeling tired. Sent to ED for evaluation denies any pain.

## 2015-06-18 NOTE — Patient Instructions (Addendum)
Medication Instructions:   NONE ORDER TODAY  If you need a refill on your cardiac medications before your next appointment, please call your pharmacy.  Labwork:  NONE ORDER TODAY  Testing/Procedures: NONE ORDER TODAY  Follow-Up:  YOU ARE BEING SENT TO OUR EMERGENCY  ROOM DEPARTMENT FOR FURTHER EVALUATION  Any Other Special Instructions Will Be Listed Below (If Applicable).

## 2015-06-18 NOTE — Progress Notes (Signed)
ANTICOAGULATION CONSULT NOTE - Initial Consult  Pharmacy Consult for Heparin Indication: atrial fibrillation  Allergies  Allergen Reactions  . Ace Inhibitors     REACTION: cough  . Codeine Other (See Comments)    sedation  . Delsym [Dextromethorphan Polistirex Er] Other (See Comments)    dizziness  . Sulfamethoxazole-Trimethoprim     REACTION: GI intolerance.  . Warfarin Sodium Other (See Comments)    "bleed out"    Patient Measurements: Height: 5\' 5"  (165.1 cm) Weight: 166 lb (75.297 kg) IBW/kg (Calculated) : 57 Heparin Dosing Weight: 72 kg  Vital Signs: Temp: 98 F (36.7 C) (03/09 1711) Temp Source: Oral (03/09 1711) BP: 106/67 mmHg (03/09 1711) Pulse Rate: 97 (03/09 1711)  Labs:  Recent Labs  06/18/15 1040  HGB 13.8  HCT 41.2  PLT 235  CREATININE 1.21*    Estimated Creatinine Clearance: 36.4 mL/min (by C-G formula based on Cr of 1.21).   Medical History: Past Medical History  Diagnosis Date  . Atrial fibrillation (HCC)     h/o sig bleed on coumadin  . GERD (gastroesophageal reflux disease)   . Sciatica     Right  . Insomnia      off chronic ambien 5mg  as of 10/11, rare/episodic use since then  DCM/CHF  . Depression     previously on zoloft  . Osteoporosis     on DXA 05/2010  . H/O: hysterectomy 1979    (ovaries left intact)  . CHF (congestive heart failure) (HCC)   . Diverticulosis     descending and sigmoid colon--Dr. Jarold Motto    Assessment: 35 YOF admitted on 3/9 with symptoms related to Afib with RVR. The patient had previously been on warfarin however this was stopped after the patient developed a retroperitoneal bleed in 2010. The patient was admitted for Afib management including the restart of anticoagulation. Pharmacy has been consulted to start heparin - long term plans are to consider transitioning the patient to pradaxa in lieu of a reversal agent now. Hep wt: 72 kg, baseline CBC wnl. No recent surgeries or hx CVA noted.  Goal of  Therapy:  Heparin level 0.3-0.7 units/ml Monitor platelets by anticoagulation protocol: Yes   Plan:  1. Heparin 2000 unit bolus x 1 (1/2 bolus given hx of RP bleed) 2. Start heparin drip at a rate of 1000 units/hr (10 ml/hr) 3. Daily heparin levels, CBC 4. Will continue to monitor for any signs/symptoms of bleeding and will follow up with heparin level in 8 hours   Georgina Pillion, PharmD, BCPS Clinical Pharmacist Pager: [phone] 06/18/2015 5:27 PM

## 2015-06-18 NOTE — Progress Notes (Signed)
     I have placed admission orders and am supplementing her potassium.   Cline Crock PA-C  [facility]

## 2015-06-18 NOTE — H&P (Addendum)
Admission History and Physical:    Date:  06/18/2015   ID:  [name], DOB [birth year], MRN [id number]  PCP:  Crawford Givens, MD  Cardiologist:  Dr. Charlton Haws   Electrophysiologist:  n/a  Chief Complaint  Patient presents with  . BP low    History of Present Illness:     [name] is a 82 y.o. female with a hx of PAF, NICM, systolic HF, LBBB, prior spontaneous retroperitoneal bleed on Coumadin, LE edema. LHC in 2010 neg for CAD.  EF as low as 20% in the past.  Last Echo in 2015 with normal LVF with EF 50-55%.  Last seen by Dr. Charlton Haws 4/16.    OV with PCP 06/12/15 for diarrhea. Dx with viral gastroenteritis.  She also contacted me with c/o dizziness, fatigue and dry mouth (via MyChart). Review of chart: SCr 0.7 >> 1.23.  I recommended she take Torsemide only prn for weight gain, edema, inc dyspnea.    She is here with her husband.  She has felt poorly for the last week.  She denies dyspnea, chest pain, orthopnea, PND, edema.  She denies syncope.  She is dizzy.  She has felt near syncopal.  She feels a little better since stopping the Torsemide.  She has a nonproductive cough. She has been nauseated and taking prn Zofran.     Past Medical History  Diagnosis Date  . Atrial fibrillation (HCC)     h/o sig bleed on coumadin  . GERD (gastroesophageal reflux disease)   . Sciatica     Right  . Insomnia      off chronic ambien  as of 10/11, rare/episodic use since then  DCM/CHF  . Depression     previously on zoloft  . Osteoporosis     on DXA 05/2010  . H/O: hysterectomy 1979    (ovaries left intact)  . CHF (congestive heart failure) (HCC)   . Diverticulosis     descending and sigmoid colon--Dr. Jarold Motto    Past Surgical History  Procedure Laterality Date  . Partial hysterectomy  1979    ovaries intact    Current Medications: Outpatient Prescriptions Prior to Visit  Medication Sig Dispense Refill  . aspirin 81 MG tablet Take 81 mg by mouth daily.      .  metoprolol succinate (TOPROL-XL) 25 MG 24 hr tablet Take 25 mg by mouth daily.    [name] omeprazole (PRILOSEC OTC) 20 MG tablet Take 20 mg by mouth daily as needed (HEArtburn).     . ranitidine (ZANTAC) 150 MG tablet Take 150 mg by mouth every morning.    . B Complex-C (SUPER B COMPLEX PO) Take 1 tablet by mouth daily.    . Biotin 5000 MCG CAPS Take 1 capsule by mouth.    . Cholecalciferol ([employer] VITAMIN D3) 1000 UNITS capsule Take 1 capsule (1,000 Units total) by mouth daily.    . diphenhydrAMINE (BENADRYL) 25 MG tablet Take 12.5 mg by mouth every 6 (six) hours as needed.    . loratadine (CLARITIN) 10 MG tablet Take 10 mg by mouth daily as needed.     . naproxen sodium (ANAPROX) 220 MG tablet Take 220 mg by mouth 2 (two) times daily with a meal.    . ondansetron (ZOFRAN ODT) 4 MG disintegrating tablet Take 1 tablet (4 mg total) by mouth every 8 (eight) hours as needed for nausea or vomiting. 20 tablet 0  . torsemide (DEMADEX) 20 MG tablet Take 1 tablet (20  mg total) by mouth 2 (two) times daily as needed. 60 tablet 6   No facility-administered medications prior to visit.     Allergies:   Ace inhibitors; Codeine; Delsym; Sulfamethoxazole-trimethoprim; and Warfarin sodium   Social History   Social History  . Marital Status: Married    Spouse Name: N/A  . Number of Children: 2  . Years of Education: N/A   Occupational History  . Retired    Social History Main Topics  . Smoking status: Never Smoker   . Smokeless tobacco: Never Used  . Alcohol Use: 0.0 oz/week    0 Standard drinks or equivalent per week     Comment: very rare-beer or wine  . Drug Use: No  . Sexual Activity: Not Asked   Other Topics Concern  . None   Social History Narrative   Retired, former [employer]   Married, 1953   Lives with husband   2 grown children, one in [place], one out of state   Tobacco Use - No.    Alcohol Use - yes occ beer or wine   Drug Use - no   teaches Sunday school, reading      Family History:  The patient's family history includes Cancer in her father; Colon cancer in her father; Tuberculosis in her mother. There is no history of Breast cancer.   ROS:   Please see the history of present illness.    ROS All other systems reviewed and are negative.   Physical Exam:    VS:  BP 97/64 mmHg  Pulse 132  Ht 5\' 5"  (1.651 m)  Wt 168 lb 6.4 oz (76.386 kg)  BMI 28.02 kg/m2  SpO2 98%   GEN: Well nourished, well developed, in no acute distress HEENT: normal Neck: no JVD, no masses Cardiac: Normal S1/S2, irreg irreg rhythm; no murmurs,   no edema   Respiratory:  clear to auscultation bilaterally; no wheezing, rhonchi or rales GI: soft, nontender  MS: no deformity or atrophy Skin: warm and dry  Neuro:    no focal deficits  Psych: Alert and oriented x 3, normal affect  Wt Readings from Last 3 Encounters:  06/18/15 168 lb 6.4 oz (76.386 kg)  06/12/15 168 lb 8 oz (76.431 kg)  06/04/15 173 lb 12 oz (78.812 kg)      Studies/Labs Reviewed:     EKG:  EKG is  ordered today.  The ekg ordered today demonstrates AFib with RVR, HR 132, LBBB   Recent Labs: 06/04/2015: ALT 13; Hemoglobin 12.7; Platelets 277.0; TSH 1.26 06/12/2015: BUN 22; Creatinine, Ser 1.23*; Potassium 4.0; Sodium 137   Recent Lipid Panel    Component Value Date/Time   CHOL 225* 10/17/2014 0836   TRIG 238.0* 10/17/2014 0836   HDL 43.50 10/17/2014 0836   CHOLHDL 5 10/17/2014 0836   VLDL 47.6* 10/17/2014 0836   LDLCALC 149* 08/19/2013 1042   LDLDIRECT 56.0 10/17/2014 0836    Additional studies/ records that were reviewed today include:   Echo 10/15 EF 50-55%, no RWMA, Gr 1 DD, mild AI  LHC 04/2008 IMPRESSION: 1. No angiographic evidence of coronary artery disease. 2. Nonischemic cardiomyopathy. 3. Global left ventricular systolic dysfunction.  EF 15-20%    ASSESSMENT:     1. Atrial fibrillation with RVR (HCC)   2. Chronic systolic CHF (congestive heart failure) (HCC)   3. LBBB  (left bundle branch block)     PLAN:     In order of problems listed above:  1.  AFib with RVR - Patient is significantly symptomatic with AFib.  She has a prior hx of RTP bleed in 2010.  Reviewed records.  She had a cath prior to this.  I reviewed her case with Dr. Dietrich Pates (DOD).  We think she is likely a good candidate for anticoagulation. She would likely be able to take Pradaxa in light of the availability of a reversal agent in the hospital.  Today, she needs to be admitted for rate control.  There are no beds. She will be sent to the ED.  Start with IVF hydration for now and attempt IV rate control with Diltiazem.  Our team will see in the ED.  Will start with IV Heparin and then transition to Oral anticoagulation.  We could try TEE-DCCV with addition of antiarrhythmic and possible DC of oral anticoagulation 4 weeks post DCCV if bleeding risk continues to be a huge concern.  TSH in 2/17 normal.    2. Chronic Systolic CHF - Hx of NICM. EF has recovered in the past.  EF 50-55% by echo in 2015.  No evidence of volume excess.  Will need repeat Echo once HR better controlled.   3. LBBB - No CAD on cath in 2010.  Chronic.      Medication Adjustments/Labs and Tests Ordered: Current medicines are reviewed at length with the patient today.  Concerns regarding medicines are outlined above.  Medication changes, Labs and Tests ordered today are outlined in the Patient Instructions noted below. Patient Instructions  Medication Instructions:   NONE ORDER TODAY  If you need a refill on your cardiac medications before your next appointment, please call your pharmacy.  Labwork:  NONE ORDER TODAY  Testing/Procedures: NONE ORDER TODAY  Follow-Up:  YOU ARE BEING SENT TO OUR EMERGENCY  ROOM DEPARTMENT FOR FURTHER EVALUATION  Any Other Special Instructions Will Be Listed Below (If Applicable).                                                          Signed, [name], PA-C  06/18/2015 9:47 AM     [facility] [address] [place], [place], [place]  [address] Phone: [phone]; Fax: [phone]   Pt seen and examined  I agree with findings as ntoed by Wende Mott above   Pt with intermitt Atrial fib (CHADS VASc score of 4) , Chronic systol8ic CHF found to be in afib with rapid rate  Feels bad ExamLungs are clear  Cardiac exam Irreg Irreg  Ext without edema  I would recomm admitting for HR control and possible anticoagulation and cardioversion Note that when pt had bleed in 2010 (retroperitoneal) it was right after L heart cath (from groin)   Has not been on anticoag since  May be OK to start on Pradaxa with close f/ollow up.  Dietrich Pates 06/18/2015

## 2015-06-18 NOTE — ED Notes (Signed)
Doctor at bedside.

## 2015-06-18 NOTE — ED Notes (Signed)
Irrigated right eye for the 3rd time for 15 minutes. Tolerated well.

## 2015-06-19 ENCOUNTER — Observation Stay (HOSPITAL_COMMUNITY): Payer: Medicare Other | Admitting: Certified Registered Nurse Anesthetist

## 2015-06-19 ENCOUNTER — Encounter (HOSPITAL_COMMUNITY): Payer: Self-pay

## 2015-06-19 ENCOUNTER — Encounter (HOSPITAL_COMMUNITY)
Admission: EM | Disposition: A | Discharge status: Home / Self Care | Payer: Self-pay | Source: Home / Self Care | Attending: Internal Medicine

## 2015-06-19 ENCOUNTER — Observation Stay (HOSPITAL_COMMUNITY): Payer: Medicare Other

## 2015-06-19 DIAGNOSIS — I4891 Unspecified atrial fibrillation: Secondary | ICD-10-CM

## 2015-06-19 DIAGNOSIS — Z7982 Long term (current) use of aspirin: Secondary | ICD-10-CM | POA: Diagnosis not present

## 2015-06-19 DIAGNOSIS — I9742 Intraoperative hemorrhage and hematoma of a circulatory system organ or structure complicating other procedure: Secondary | ICD-10-CM | POA: Diagnosis not present

## 2015-06-19 DIAGNOSIS — I447 Left bundle-branch block, unspecified: Secondary | ICD-10-CM | POA: Diagnosis present

## 2015-06-19 DIAGNOSIS — Y848 Other medical procedures as the cause of abnormal reaction of the patient, or of later complication, without mention of misadventure at the time of the procedure: Secondary | ICD-10-CM | POA: Diagnosis not present

## 2015-06-19 DIAGNOSIS — I5022 Chronic systolic (congestive) heart failure: Secondary | ICD-10-CM | POA: Diagnosis present

## 2015-06-19 DIAGNOSIS — R002 Palpitations: Secondary | ICD-10-CM | POA: Diagnosis present

## 2015-06-19 DIAGNOSIS — I480 Paroxysmal atrial fibrillation: Secondary | ICD-10-CM | POA: Diagnosis present

## 2015-06-19 DIAGNOSIS — M810 Age-related osteoporosis without current pathological fracture: Secondary | ICD-10-CM | POA: Diagnosis present

## 2015-06-19 DIAGNOSIS — I5032 Chronic diastolic (congestive) heart failure: Secondary | ICD-10-CM | POA: Diagnosis not present

## 2015-06-19 DIAGNOSIS — I429 Cardiomyopathy, unspecified: Secondary | ICD-10-CM | POA: Diagnosis present

## 2015-06-19 DIAGNOSIS — K219 Gastro-esophageal reflux disease without esophagitis: Secondary | ICD-10-CM | POA: Diagnosis present

## 2015-06-19 HISTORY — PX: CARDIOVERSION: SHX1299

## 2015-06-19 HISTORY — PX: TEE WITHOUT CARDIOVERSION: SHX5443

## 2015-06-19 LAB — PROTIME-INR
INR: 1.14 (ref 0.00–1.49)
Prothrombin Time: 14.8 seconds (ref 11.6–15.2)

## 2015-06-19 LAB — BASIC METABOLIC PANEL
Anion gap: 10 (ref 5–15)
BUN: 15 mg/dL (ref 6–20)
CO2: 28 mmol/L (ref 22–32)
Calcium: 9.1 mg/dL (ref 8.9–10.3)
Chloride: 102 mmol/L (ref 101–111)
Creatinine, Ser: 1.05 mg/dL — ABNORMAL HIGH (ref 0.44–1.00)
GFR calc Af Amer: 56 mL/min — ABNORMAL LOW (ref >60)
GFR calc non Af Amer: 48 mL/min — ABNORMAL LOW (ref >60)
Glucose, Bld: 106 mg/dL — ABNORMAL HIGH (ref 65–99)
Potassium: 4.7 mmol/L (ref 3.5–5.1)
Sodium: 140 mmol/L (ref 135–145)

## 2015-06-19 LAB — TYPE AND SCREEN
ABO/RH(D): O NEG
Antibody Screen: NEGATIVE

## 2015-06-19 LAB — CBC
HCT: 36.2 % (ref 36.0–46.0)
HCT: 35.2 % — ABNORMAL LOW (ref 36.0–46.0)
Hemoglobin: 11.4 g/dL — ABNORMAL LOW (ref 12.0–15.0)
Hemoglobin: 11.2 g/dL — ABNORMAL LOW (ref 12.0–15.0)
MCH: 29.2 pg (ref 26.0–34.0)
MCH: 29.5 pg (ref 26.0–34.0)
MCHC: 31.5 g/dL (ref 30.0–36.0)
MCHC: 31.8 g/dL (ref 30.0–36.0)
MCV: 92.8 fL (ref 78.0–100.0)
MCV: 92.6 fL (ref 78.0–100.0)
Platelets: 200 10*3/uL (ref 150–400)
Platelets: 241 10*3/uL (ref 150–400)
RBC: 3.9 MIL/uL (ref 3.87–5.11)
RBC: 3.8 MIL/uL — ABNORMAL LOW (ref 3.87–5.11)
RDW: 13.2 % (ref 11.5–15.5)
RDW: 13.2 % (ref 11.5–15.5)
WBC: 6.7 10*3/uL (ref 4.0–10.5)
WBC: 8.2 10*3/uL (ref 4.0–10.5)

## 2015-06-19 LAB — HEPARIN LEVEL (UNFRACTIONATED)
Heparin Unfractionated: 0.27 IU/mL — ABNORMAL LOW (ref 0.30–0.70)
Heparin Unfractionated: 0.61 IU/mL (ref 0.30–0.70)

## 2015-06-19 LAB — HEMOGLOBIN A1C
Hgb A1c MFr Bld: 5.6 % (ref 4.8–5.6)
Mean Plasma Glucose: 114 mg/dL

## 2015-06-19 LAB — LIPID PANEL
Cholesterol: 142 mg/dL (ref 0–200)
HDL: 37 mg/dL — ABNORMAL LOW (ref >40)
LDL Cholesterol: 74 mg/dL (ref 0–99)
Total CHOL/HDL Ratio: 3.8 RATIO
Triglycerides: 155 mg/dL — ABNORMAL HIGH (ref <150)
VLDL: 31 mg/dL (ref 0–40)

## 2015-06-19 SURGERY — ECHOCARDIOGRAM, TRANSESOPHAGEAL
Anesthesia: Monitor Anesthesia Care

## 2015-06-19 MED ORDER — HYDROMORPHONE HCL 1 MG/ML IJ SOLN
0.5000 mg | INTRAMUSCULAR | Status: DC | PRN
  Administered 2015-06-19 – 2015-06-23 (×2): 0.5 mg via INTRAVENOUS
  Filled 2015-06-19 (×3): qty 1

## 2015-06-19 MED ORDER — PHENYLEPHRINE HCL 10 MG/ML IJ SOLN
INTRAMUSCULAR | Status: DC | PRN
  Administered 2015-06-19 (×2): 40 ug via INTRAVENOUS

## 2015-06-19 MED ORDER — IOHEXOL 350 MG/ML SOLN
80.0000 mL | Freq: Once | INTRAVENOUS | Status: AC | PRN
  Administered 2015-06-19: 100 mL via INTRAVENOUS

## 2015-06-19 MED ORDER — PROPOFOL 10 MG/ML IV BOLUS
INTRAVENOUS | Status: DC | PRN
  Administered 2015-06-19 (×3): 20 mg via INTRAVENOUS
  Administered 2015-06-19 (×2): 10 mg via INTRAVENOUS
  Administered 2015-06-19 (×2): 20 mg via INTRAVENOUS

## 2015-06-19 MED ORDER — BUTAMBEN-TETRACAINE-BENZOCAINE 2-2-14 % EX AERO
INHALATION_SPRAY | CUTANEOUS | Status: DC | PRN
  Administered 2015-06-19: 2 via TOPICAL

## 2015-06-19 MED ORDER — SODIUM CHLORIDE 0.9 % IV SOLN
INTRAVENOUS | Status: DC
  Administered 2015-06-19: 13:00:00 via INTRAVENOUS

## 2015-06-19 MED ORDER — LIDOCAINE HCL (CARDIAC) 20 MG/ML IV SOLN
INTRAVENOUS | Status: DC | PRN
  Administered 2015-06-19: 20 mg via INTRATRACHEAL
  Administered 2015-06-19: 40 mg via INTRATRACHEAL
  Administered 2015-06-19: 20 mg via INTRATRACHEAL

## 2015-06-19 MED ORDER — DILTIAZEM HCL ER COATED BEADS 120 MG PO CP24
120.0000 mg | ORAL_CAPSULE | Freq: Every day | ORAL | Status: DC
  Administered 2015-06-20: 120 mg via ORAL
  Filled 2015-06-19: qty 1

## 2015-06-19 MED ORDER — DILTIAZEM HCL ER 60 MG PO CP12: 120.0000 mg | ORAL_CAPSULE | Freq: Every day | ORAL | Status: DC

## 2015-06-19 NOTE — Progress Notes (Signed)
Pt increasingly concerned about internal bleeding, given her new onset of abdominal pain and her PMX of internal bleeding with anticoagulation.  Heparin gtt discontinued.  K. Janee Morn, [place] paged and will have someone on site come to evaluate patient.

## 2015-06-19 NOTE — Progress Notes (Signed)
Pt complaining of lower right quadrant pain 7/10 made worse with movement and coughing.  States it feels like "pulled muscle".  She does not desire anything for pain at this time.

## 2015-06-19 NOTE — CV Procedure (Signed)
Procedure: TEE  Indication: Atrial fibrillation  Sedation: Propofol per anesthesiology  Findings: Please see echo section for full report.  Normal LV size with EF 50%, mild diffuse hypokinesis.  Normal RV size and systolic function.  Moderate to severe biatrial enlargement.  No LA appendage thrombus.  Moderate TR, peak RV-RA gradient 29 mmHg.  Trivial MR.  Trileaflet aortic valve with no stenosis and trivial regurgitation.  No ASD or PFO by color doppler.  Normal caliber thoracic aorta with minimal plaque.   May proceed to DCCV.   [name] 06/19/2015 1:06 PM

## 2015-06-19 NOTE — Anesthesia Postprocedure Evaluation (Signed)
Anesthesia Post Note  Patient: [name]  Procedure(s) Performed: Procedure(s) (LRB): TRANSESOPHAGEAL ECHOCARDIOGRAM (TEE) (N/A) CARDIOVERSION (N/A)  Patient location during evaluation: PACU Anesthesia Type: MAC Level of consciousness: awake and alert Pain management: pain level controlled Vital Signs Assessment: post-procedure vital signs reviewed and stable Respiratory status: spontaneous breathing, nonlabored ventilation, respiratory function stable and patient connected to nasal cannula oxygen Cardiovascular status: stable and blood pressure returned to baseline Anesthetic complications: no    Last Vitals:  Filed Vitals:   06/19/15 1330 06/19/15 1350  BP: 110/76 114/59  Pulse: 74 77  Temp:  36.7 C  Resp: 16     Last Pain: There were no vitals filed for this visit.               Shelton Silvas

## 2015-06-19 NOTE — Progress Notes (Addendum)
Patient ID: [name], female   DOB: 1930-09-01, 82 y.o.   MRN: [id number]    Subjective:  Feels better with HR control  Objective:  Filed Vitals:   06/18/15 1711 06/18/15 2153 06/18/15 2355 06/19/15 0500  BP: 106/67 100/65 108/53 96/52  Pulse: 97 95  75  Temp: 98 F (36.7 C) 98 F (36.7 C)  98.1 F (36.7 C)  TempSrc: Oral Oral  Oral  Resp: 18 18  18   Height: 5\' 5"  (1.651 m)     Weight: 75.297 kg (166 lb)   77.1 kg (169 lb 15.6 oz)  SpO2: 94% 95%  94%    Intake/Output from previous day:  Intake/Output Summary (Last 24 hours) at 06/19/15 3435 Last data filed at 06/18/15 1927  Gross per 24 hour  Intake    240 ml  Output      0 ml  Net    240 ml    Physical Exam: Affect appropriate Healthy:  appears stated age HEENT: normal Neck supple with no adenopathy JVP normal no bruits no thyromegaly Lungs clear with no wheezing and good diaphragmatic motion Heart:  S1/S2 no murmur, no rub, gallop or click PMI normal Abdomen: benighn, BS positve, no tenderness, no AAA no bruit.  No HSM or HJR Distal pulses intact with no bruits No edema Neuro non-focal Skin warm and dry No muscular weakness   Lab Results: Basic Metabolic Panel:  Recent Labs  68/61/68 1040 06/19/15 0228  NA 139 140  K 3.1* 4.7  CL 98* 102  CO2 28 28  GLUCOSE 118* 106*  BUN 11 15  CREATININE 1.21* 1.05*  CALCIUM 9.1 9.1   Liver Function Tests:  Recent Labs  06/18/15 1040  AST 38  ALT 26  ALKPHOS 68  BILITOT 0.9  PROT 6.2*  ALBUMIN 3.4*   No results for input(s): LIPASE, AMYLASE in the last 72 hours. CBC:  Recent Labs  06/18/15 1040 06/19/15 0228  WBC 7.0 6.7  NEUTROABS 4.3  --   HGB 13.8 11.4*  HCT 41.2 36.2  MCV 91.6 92.8  PLT 235 200  Hemoglobin A1C:  Recent Labs  06/18/15 1725  HGBA1C 5.6   Fasting Lipid Panel:  Recent Labs  06/19/15 0228  CHOL 142  HDL 37*  LDLCALC 74  TRIG 372*  CHOLHDL 3.8    Imaging: Dg Chest Portable 1 View  06/18/2015   CLINICAL DATA:  Shortness of breath for 10 days. EXAM: PORTABLE CHEST 1 VIEW COMPARISON:  PA and lateral chest 05/17/2008. FINDINGS: The lungs are clear. Heart size is normal. No pneumothorax or pleural effusion. IMPRESSION: No acute disease. Electronically Signed   By: Drusilla Kanner M.D.   On: 06/18/2015 11:25    Cardiac Studies:  ECG:  Rapid atrial fibrillation    Telemetry:  afib rate 90's   Echo:   Medications:   . famotidine  10 mg Oral Daily  . feeding supplement (ENSURE ENLIVE)  237 mL Oral BID BM  . metoprolol succinate  25 mg Oral Daily     . sodium chloride    . diltiazem (CARDIZEM) infusion 5 mg/hr (06/18/15 2214)  . heparin 1,200 Units/hr (06/19/15 0329)    Assessment/Plan:   This patients CHA2DS2-VASc Score and unadjusted Ischemic Stroke Rate (% per year) is equal to 4.8 % stroke rate/year from a score of 4  Above score calculated as 1 point each if present [CHF, HTN, DM, Vascular=MI/PAD/Aortic Plaque, Age if 65-74, or Female] Above score calculated as 2  points each if present [Age > 75, or Stroke/TIA/TE]  Afib:  On heparin discussed with nurse not to stop it on way to endo. Discussed TEE/DCC today and wrote orders for Dr Shirlee Latch Will need to start coumadin or pradaxa post [facility].  And be switched to PO rate control med as well    Charlton Haws 06/19/2015, 8:26 AM

## 2015-06-19 NOTE — Progress Notes (Signed)
Echocardiogram 2D Echocardiogram has been performed.  Dorothey Baseman 06/19/2015, 3:04 PM

## 2015-06-19 NOTE — Progress Notes (Signed)
ANTICOAGULATION CONSULT NOTE  Pharmacy Consult for Heparin Indication: atrial fibrillation  Allergies  Allergen Reactions  . Ace Inhibitors     REACTION: cough  . Codeine Other (See Comments)    sedation  . Delsym [Dextromethorphan Polistirex Er] Other (See Comments)    dizziness  . Sulfamethoxazole-Trimethoprim     REACTION: GI intolerance.  . Warfarin Sodium Other (See Comments)    "bleed out"    Patient Measurements: Height: 5\' 5"  (165.1 cm) Weight: 169 lb 15.6 oz (77.1 kg) IBW/kg (Calculated) : 57 Heparin Dosing Weight: 72 kg  Vital Signs: Temp: 98.1 F (36.7 C) (03/10 0500) Temp Source: Oral (03/10 0500) BP: 96/52 mmHg (03/10 0500) Pulse Rate: 75 (03/10 0500)  Labs:  Recent Labs  06/18/15 1040 06/19/15 0228  HGB 13.8 11.4*  HCT 41.2 36.2  PLT 235 200  LABPROT  --  14.8  INR  --  1.14  HEPARINUNFRC  --  0.27*  CREATININE 1.21* 1.05*    Estimated Creatinine Clearance: 42.4 mL/min (by C-G formula based on Cr of 1.05).   Medical History: Past Medical History  Diagnosis Date  . Atrial fibrillation (HCC)     h/o sig bleed on coumadin  . GERD (gastroesophageal reflux disease)   . Sciatica     Right  . Insomnia      off chronic ambien 5mg  as of 10/11, rare/episodic use since then  DCM/CHF  . Osteoporosis     on DXA 05/2010  . CHF (congestive heart failure) (HCC)   . Diverticulosis     descending and sigmoid colon--Dr. Jarold Motto  . Complication of anesthesia 2010    "w/cardiac cath; got into a psychotic state for ~ 3 days; got me out w/Valium"  . Arthritis     "knees" (06/18/2015)  . Depression     "I have it off and on; not as often as I've gotten older" (06/18/2015)  . Atrial fibrillation with RVR (HCC) 06/18/2015    Assessment: [name] admitted on 3/9 with symptoms related to Afib with RVR. The patient had previously been on warfarin however this was stopped with retroperitoneal bleed in 2010. Admitted for Afib management, including the restart of  anticoagulation. Pharmacy has been consulted to start heparin - long term plans are to consider transitioning the patient to pradaxa with availability of a reversal agent now. No recent surgeries or hx CVA noted.  HL therapeutic x1 (0.61) on 1200 units/h. Hg down to 11.4, plt wnl. No bleed  Goal of Therapy:  Heparin level 0.3-0.7 units/ml Monitor platelets by anticoagulation protocol: Yes   Plan:  Heparin drip at 1200 units/h 8h HL to confirm Daily HL/CBC Monitor for any signs/symptoms of bleeding F/u long-term [facility] plans  Babs Bertin, PharmD, [facility] Clinical Pharmacist Pager [phone] 06/19/2015 10:25 AM

## 2015-06-19 NOTE — Progress Notes (Signed)
Pt transferred to 2H24. Pt settled in room with belongings and husband at bedside. Marcelino Duster RN at bedside with pt.   Irby Fails, RN

## 2015-06-19 NOTE — Progress Notes (Signed)
ANTICOAGULATION CONSULT NOTE - Follow Up Consult  Pharmacy Consult for heparin Indication: atrial fibrillation   Labs:  Recent Labs  06/18/15 1040 06/19/15 0228  HGB 13.8 11.4*  HCT 41.2 36.2  PLT 235 200  LABPROT  --  14.8  INR  --  1.14  HEPARINUNFRC  --  0.27*  CREATININE 1.21*  --      Assessment: 82yo female subtherapeutic on heparin with initial dosing for Afib though bolus wasn't given until 2200 so likely still playing a role.  Goal of Therapy:  Heparin level 0.3-0.7 units/ml   Plan:  Will increase heparin gtt by 2-3 units/kg/hr to 1200 units/hr and check level in 8hr.  Vernard Gambles, PharmD, BCPS  06/19/2015,3:17 AM

## 2015-06-19 NOTE — Transfer of Care (Signed)
Immediate Anesthesia Transfer of Care Note  Patient: [name]  Procedure(s) Performed: Procedure(s): TRANSESOPHAGEAL ECHOCARDIOGRAM (TEE) (N/A) CARDIOVERSION (N/A)  Patient Location: Endoscopy Unit  Anesthesia Type:MAC  Level of Consciousness: awake, alert  and oriented  Airway & Oxygen Therapy: Patient Spontanous Breathing and Patient connected to nasal cannula oxygen  Post-op Assessment: Report given to RN and Post -op Vital signs reviewed and stable  Post vital signs: Reviewed and stable  Last Vitals:  Filed Vitals:   06/19/15 0500 06/19/15 1154  BP: 96/52 127/65  Pulse: 75   Temp: 36.7 C 36.7 C  Resp: 18 22    Complications: No apparent anesthesia complications

## 2015-06-19 NOTE — Progress Notes (Signed)
   CHADS2-VASc=4 (female, age, CHF).  Signed,  [name], PA-C   06/19/2015 2:04 PM

## 2015-06-19 NOTE — Progress Notes (Signed)
Spoke to K. Janee Morn, [place] regarding pt abdominal pain.  Pt experiencing increased abdominal tenderness and feeling a "knot".  RN able to palpate and feel the area she described.  PA is currently at [facility], will examine pt when she is back on [facility] or may have someone else come evaluate her.

## 2015-06-19 NOTE — Consult Note (Signed)
Reason for Consult:rectus hematoma Referring Physician: Radford Pax MD   [name] is an 82 y.o. female.  HPI: Asked to see patient at the request of Dr Radford Pax for sudden sharp intense right lower quadrant abdominal pain.  Pt on anticoagulation for a fib.  No abdominal pain prior to this.  She feels better now with ice pack.  CT shows right rectus hematoma with possible extravasation.    Past Medical History  Diagnosis Date  . Atrial fibrillation ([place])     h/o sig bleed on coumadin  . GERD (gastroesophageal reflux disease)   . Sciatica     Right  . Insomnia      off chronic ambien 23m as of 10/11, rare/episodic use since then  DCM/CHF  . Osteoporosis     on DXA 05/2010  . CHF (congestive heart failure) ([facility]   . Diverticulosis     descending and sigmoid colon--Dr. PSharlett Iles . Complication of anesthesia 2010    "w/cardiac cath; got into a psychotic state for ~ 3 days; got me out w/Valium"  . Arthritis     "knees" (06/18/2015)  . Depression     "I have it off and on; not as often as I've gotten older" (06/18/2015)  . Atrial fibrillation with RVR ([facility] 06/18/2015    Past Surgical History  Procedure Laterality Date  . Vaginal hysterectomy  1979    ovaries intact  . Dilation and curettage of uterus    . Cataract extraction w/ intraocular lens  implant, bilateral Bilateral   . Cardiac catheterization  2010    Family History  Problem Relation Age of Onset  . Cancer Father   . Colon cancer Father     possible colon cancer  . Tuberculosis Mother   . Breast cancer Neg Hx     Social History:  reports that she has never smoked. She has never used smokeless tobacco. She reports that she drinks alcohol. She reports that she does not use illicit drugs.  Allergies:  Allergies  Allergen Reactions  . Ace Inhibitors     REACTION: cough  . Codeine Other (See Comments)    sedation  . Delsym [Dextromethorphan Polistirex Er] Other (See Comments)    dizziness  .  Sulfamethoxazole-Trimethoprim     REACTION: GI intolerance.  . Warfarin Sodium Other (See Comments)    "bleed out"    Medications: I have reviewed the patient's current medications.  Results for orders placed or performed during the hospital encounter of 06/18/15 (from the past 48 hour(s))  Comprehensive metabolic panel     Status: Abnormal   Collection Time: 06/18/15 10:40 AM  Result Value Ref Range   Sodium 139 135 - 145 mmol/L   Potassium 3.1 (L) 3.5 - 5.1 mmol/L   Chloride 98 (L) 101 - 111 mmol/L   CO2 28 22 - 32 mmol/L   Glucose, Bld 118 (H) 65 - 99 mg/dL   BUN 11 6 - 20 mg/dL   Creatinine, Ser 1.21 (H) 0.44 - 1.00 mg/dL   Calcium 9.1 8.9 - 10.3 mg/dL   Total Protein 6.2 (L) 6.5 - 8.1 g/dL   Albumin 3.4 (L) 3.5 - 5.0 g/dL   AST 38 15 - 41 U/L   ALT 26 14 - 54 U/L   Alkaline Phosphatase 68 38 - 126 U/L   Total Bilirubin 0.9 0.3 - 1.2 mg/dL   GFR calc non Af Amer 41 (L) >60 mL/min   GFR calc Af Amer 47 (L) >60 mL/min  Comment: (NOTE) The eGFR has been calculated using the CKD EPI equation. This calculation has not been validated in all clinical situations. eGFR's persistently <60 mL/min signify possible Chronic Kidney Disease.    Anion gap 13 5 - 15  CBC with Differential/Platelet     Status: None   Collection Time: 06/18/15 10:40 AM  Result Value Ref Range   WBC 7.0 4.0 - 10.5 K/uL   RBC 4.50 3.87 - 5.11 MIL/uL   Hemoglobin 13.8 12.0 - 15.0 g/dL   HCT 41.2 36.0 - 46.0 %   MCV 91.6 78.0 - 100.0 fL   MCH 30.7 26.0 - 34.0 pg   MCHC 33.5 30.0 - 36.0 g/dL   RDW 13.0 11.5 - 15.5 %   Platelets 235 150 - 400 K/uL   Neutrophils Relative % 60 %   Neutro Abs 4.3 1.7 - 7.7 K/uL   Lymphocytes Relative 33 %   Lymphs Abs 2.3 0.7 - 4.0 K/uL   Monocytes Relative 7 %   Monocytes Absolute 0.5 0.1 - 1.0 K/uL   Eosinophils Relative 0 %   Eosinophils Absolute 0.0 0.0 - 0.7 K/uL   Basophils Relative 0 %   Basophils Absolute 0.0 0.0 - 0.1 K/uL  Brain natriuretic peptide      Status: Abnormal   Collection Time: 06/18/15 10:40 AM  Result Value Ref Range   B Natriuretic Peptide 284.0 (H) 0.0 - 100.0 pg/mL  I-stat troponin, ED     Status: None   Collection Time: 06/18/15 10:57 AM  Result Value Ref Range   Troponin i, poc 0.00 0.00 - 0.08 ng/mL   Comment 3            Comment: Due to the release kinetics of cTnI, a negative result within the first hours of the onset of symptoms does not rule out myocardial infarction with certainty. If myocardial infarction is still suspected, repeat the test at appropriate intervals.   Hemoglobin A1c     Status: None   Collection Time: 06/18/15  5:25 PM  Result Value Ref Range   Hgb A1c MFr Bld 5.6 4.8 - 5.6 %    Comment: (NOTE)         Pre-diabetes: 5.7 - 6.4         Diabetes: >6.4         Glycemic control for adults with diabetes: <7.0    Mean Plasma Glucose 114 mg/dL    Comment: (NOTE) Performed At: [facility] [place], [place] [id number] Lindon Romp MD MP:[id number]   Heparin level (unfractionated)     Status: Abnormal   Collection Time: 06/19/15  2:28 AM  Result Value Ref Range   Heparin Unfractionated 0.27 (L) 0.30 - 0.70 IU/mL    Comment:        IF HEPARIN RESULTS ARE BELOW EXPECTED VALUES, AND PATIENT DOSAGE HAS BEEN CONFIRMED, SUGGEST FOLLOW UP TESTING OF ANTITHROMBIN III LEVELS.   Basic metabolic panel     Status: Abnormal   Collection Time: 06/19/15  2:28 AM  Result Value Ref Range   Sodium 140 135 - 145 mmol/L   Potassium 4.7 3.5 - 5.1 mmol/L    Comment: DELTA CHECK NOTED   Chloride 102 101 - 111 mmol/L   CO2 28 22 - 32 mmol/L   Glucose, Bld 106 (H) 65 - 99 mg/dL   BUN 15 6 - 20 mg/dL   Creatinine, Ser 1.05 (H) 0.44 - 1.00 mg/dL   Calcium 9.1 8.9 - 10.3  mg/dL   GFR calc non Af Amer 48 (L) >60 mL/min   GFR calc Af Amer 56 (L) >60 mL/min    Comment: (NOTE) The eGFR has been calculated using the CKD EPI equation. This calculation has not been validated in all  clinical situations. eGFR's persistently <60 mL/min signify possible Chronic Kidney Disease.    Anion gap 10 5 - 15  CBC     Status: Abnormal   Collection Time: 06/19/15  2:28 AM  Result Value Ref Range   WBC 6.7 4.0 - 10.5 K/uL   RBC 3.90 3.87 - 5.11 MIL/uL   Hemoglobin 11.4 (L) 12.0 - 15.0 g/dL   HCT 36.2 36.0 - 46.0 %   MCV 92.8 78.0 - 100.0 fL   MCH 29.2 26.0 - 34.0 pg   MCHC 31.5 30.0 - 36.0 g/dL   RDW 13.2 11.5 - 15.5 %   Platelets 200 150 - 400 K/uL  Lipid panel     Status: Abnormal   Collection Time: 06/19/15  2:28 AM  Result Value Ref Range   Cholesterol 142 0 - 200 mg/dL   Triglycerides 155 (H) <150 mg/dL   HDL 37 (L) >40 mg/dL   Total CHOL/HDL Ratio 3.8 RATIO   VLDL 31 0 - 40 mg/dL   LDL Cholesterol 74 0 - 99 mg/dL    Comment:        Total Cholesterol/HDL:CHD Risk Coronary Heart Disease Risk Table                     Men   Women  1/2 Average Risk   3.4   3.3  Average Risk       5.0   4.4  2 X Average Risk   9.6   7.1  3 X Average Risk  23.4   11.0        Use the calculated Patient Ratio above and the CHD Risk Table to determine the patient's CHD Risk.        ATP III CLASSIFICATION (LDL):  <100     mg/dL   Optimal  100-129  mg/dL   Near or Above                    Optimal  130-159  mg/dL   Borderline  160-189  mg/dL   High  >190     mg/dL   Very High   Protime-INR     Status: None   Collection Time: 06/19/15  2:28 AM  Result Value Ref Range   Prothrombin Time 14.8 11.6 - 15.2 seconds   INR 1.14 0.00 - 1.49  Heparin level (unfractionated)     Status: None   Collection Time: 06/19/15 10:27 AM  Result Value Ref Range   Heparin Unfractionated 0.61 0.30 - 0.70 IU/mL    Comment:        IF HEPARIN RESULTS ARE BELOW EXPECTED VALUES, AND PATIENT DOSAGE HAS BEEN CONFIRMED, SUGGEST FOLLOW UP TESTING OF ANTITHROMBIN III LEVELS.     Dg Chest Portable 1 View  06/18/2015  CLINICAL DATA:  Shortness of breath for 10 days. EXAM: PORTABLE CHEST 1 VIEW  COMPARISON:  PA and lateral chest 05/17/2008. FINDINGS: The lungs are clear. Heart size is normal. No pneumothorax or pleural effusion. IMPRESSION: No acute disease. Electronically Signed   By: Inge Rise M.D.   On: 06/18/2015 11:25   Ct Angio Abd/pel W/ And/or W/o  06/19/2015  CLINICAL DATA:  82 year old female with acute right  abdominal and pain following TEE cardioversion and heparin administration. EXAM: CTA ABDOMEN AND PELVIS WITHOUT AND WITH CONTRAST TECHNIQUE: Multidetector CT imaging of the abdomen and pelvis was performed using the standard protocol during bolus administration of intravenous contrast. Multiplanar reconstructed images and MIPs were obtained and reviewed to evaluate the vascular anatomy. CONTRAST:  149m OMNIPAQUE IOHEXOL 350 MG/ML SOLN COMPARISON:  06/03/2008 FINDINGS: Lower chest:  No acute abnormalities Hepatobiliary: The liver and gallbladder are unremarkable. There is no evidence of biliary dilatation. Pancreas: Unremarkable Spleen: Unremarkable Adrenals/Urinary Tract: Mild cortical thinning noted without other renal abnormality. The adrenal glands are unremarkable. Mild circumferential bladder wall thickening again noted. Stomach/Bowel: Unremarkable except for a few scattered colonic diverticula. Vascular/Lymphatic: Aortic atherosclerosis noted without evidence of aneurysm. The visualized mesenteric arteries are patent. No enlarged lymph nodes identified. Reproductive: The patient is status post hysterectomy. No adnexal masses. Other:   There is no evidence of free fluid or pneumoperitoneum. Musculoskeletal: A 6 x 7 x 12.5 cm right rectus sheath hematoma within the pelvis noted with evidence of active arterial extravasation. No acute or suspicious bony abnormalities noted. Degenerative changes within the lumbar spine are present. Review of the MIP images confirms the above findings. IMPRESSION: 6 x 7 x 12.5 cm right rectus sheath hematoma within the pelvis with active arterial  extravasation. Aortic atherosclerosis without aneurysm. Patent visualized mesenteric arteries. These results were discussed with Dr. TRadford Paxon 06/19/2015 at 5 45 p.m. Electronically Signed   By: JMargarette CanadaM.D.   On: 06/19/2015 18:04    Review of Systems  Constitutional: Negative for fever and chills.  HENT: Negative.   Cardiovascular: Positive for palpitations.  Gastrointestinal: Positive for abdominal pain.  Psychiatric/Behavioral: Negative.    Blood pressure 114/59, pulse 77, temperature 98 F (36.7 C), temperature source Oral, resp. rate 16, height _0  (1.651 m), weight 77.1 kg (169 lb 15.6 oz), SpO2 97 %. Physical Exam  Constitutional: She appears well-developed.  HENT:  Head: Normocephalic.  Eyes: Pupils are equal, round, and reactive to light. No scleral icterus.  Neck: Normal range of motion.  Cardiovascular:  A fib  Respiratory: Effort normal.  GI: She exhibits mass. There is tenderness.  RLQ mass tender no peritonitis  Musculoskeletal: Normal range of motion.  Skin: Skin is warm and dry. She is not diaphoretic.    Assessment/Plan: Rectus hematoma  Hold anticoagulation  Blood products as needed  Would watch overnight in ICU  Pain medication as needed  No surgery needed   Suan Pyeatt A. 06/19/2015, 6:52 PM

## 2015-06-19 NOTE — Progress Notes (Signed)
CTSP secondary to acute onset of RLQ to umbilicus pain.  Patient is s/p TEE/DCCV this am.  There is a tennis ball sized mass in the RLQ/umbilical area that is exquisitely tender to palpation. This occurred after several coughing spells.  It is not pulsatile.  ? Incarcerated hernia vs. Embolic event after DCCV from afib.  Will get stat Abdominal and pelvic CT angio.

## 2015-06-19 NOTE — Procedures (Signed)
Electrical Cardioversion Procedure Note [name] [id number] 07-18-1930  Procedure: Electrical Cardioversion Indications:  Atrial Fibrillation  Procedure Details Consent: Risks of procedure as well as the alternatives and risks of each were explained to the (patient/caregiver).  Consent for procedure obtained. Time Out: Verified patient identification, verified procedure, site/side was marked, verified correct patient position, special equipment/implants available, medications/allergies/relevent history reviewed, required imaging and test results available.  Performed  Patient placed on cardiac monitor, pulse oximetry, supplemental oxygen as necessary.  Sedation given: Propofol per anesthesiology Pacer pads placed anterior and posterior chest.  Cardioverted 1 time(s).  Cardioverted at 200J.  Evaluation Findings: Post procedure EKG shows: NSR Complications: None Patient did tolerate procedure well.   [name] 06/19/2015, 1:06 PM

## 2015-06-19 NOTE — Discharge Instructions (Signed)

## 2015-06-19 NOTE — Anesthesia Preprocedure Evaluation (Signed)
Anesthesia Evaluation  Patient identified by MRN, date of birth, ID band Patient awake    Reviewed: Allergy & Precautions, NPO status , Patient's Chart, lab work & pertinent test results, reviewed documented beta blocker date and time   History of Anesthesia Complications (+) history of anesthetic complications  Airway Mallampati: II  TM Distance: >3 FB Neck ROM: Full    Dental  (+) Teeth Intact, Dental Advisory Given   Pulmonary    breath sounds clear to auscultation       Cardiovascular +CHF  + dysrhythmias Atrial Fibrillation  Rhythm:Irregular Rate:Abnormal     Neuro/Psych PSYCHIATRIC DISORDERS Depression  Neuromuscular disease    GI/Hepatic Neg liver ROS, GERD  Medicated,  Endo/Other  negative endocrine ROS  Renal/GU negative Renal ROS  negative genitourinary   Musculoskeletal  (+) Arthritis ,   Abdominal   Peds negative pediatric ROS (+)  Hematology negative hematology ROS (+)   Anesthesia Other Findings   Reproductive/Obstetrics negative OB ROS                             Lab Results  Component Value Date   WBC 6.7 06/19/2015   HGB 11.4* 06/19/2015   HCT 36.2 06/19/2015   MCV 92.8 06/19/2015   PLT 200 06/19/2015   Lab Results  Component Value Date   INR 1.14 06/19/2015   INR 1.1 05/15/2008   INR 1.4 05/13/2008   06/2015 EKG: atrial fibrillation.    Anesthesia Physical Anesthesia Plan  ASA: III  Anesthesia Plan: MAC   Post-op Pain Management:    Induction: Intravenous  Airway Management Planned: Natural Airway and Nasal Cannula  Additional Equipment:   Intra-op Plan:   Post-operative Plan:   Informed Consent: I have reviewed the patients History and Physical, chart, labs and discussed the procedure including the risks, benefits and alternatives for the proposed anesthesia with the patient or authorized representative who has indicated his/her understanding  and acceptance.   Dental advisory given  Plan Discussed with: CRNA  Anesthesia Plan Comments:         Anesthesia Quick Evaluation

## 2015-06-20 ENCOUNTER — Encounter (HOSPITAL_COMMUNITY): Payer: Self-pay | Admitting: Cardiology

## 2015-06-20 DIAGNOSIS — I5032 Chronic diastolic (congestive) heart failure: Secondary | ICD-10-CM

## 2015-06-20 DIAGNOSIS — I5042 Chronic combined systolic (congestive) and diastolic (congestive) heart failure: Secondary | ICD-10-CM | POA: Diagnosis present

## 2015-06-20 DIAGNOSIS — S301XXA Contusion of abdominal wall, initial encounter: Secondary | ICD-10-CM | POA: Diagnosis present

## 2015-06-20 LAB — CBC
HCT: 33.1 % — ABNORMAL LOW (ref 36.0–46.0)
HCT: 33.5 % — ABNORMAL LOW (ref 36.0–46.0)
HCT: 34.6 % — ABNORMAL LOW (ref 36.0–46.0)
Hemoglobin: 10.9 g/dL — ABNORMAL LOW (ref 12.0–15.0)
Hemoglobin: 10.5 g/dL — ABNORMAL LOW (ref 12.0–15.0)
Hemoglobin: 11 g/dL — ABNORMAL LOW (ref 12.0–15.0)
MCH: 30.6 pg (ref 26.0–34.0)
MCH: 29.2 pg (ref 26.0–34.0)
MCH: 29.4 pg (ref 26.0–34.0)
MCHC: 32.9 g/dL (ref 30.0–36.0)
MCHC: 31.3 g/dL (ref 30.0–36.0)
MCHC: 31.8 g/dL (ref 30.0–36.0)
MCV: 93 fL (ref 78.0–100.0)
MCV: 93.1 fL (ref 78.0–100.0)
MCV: 92.5 fL (ref 78.0–100.0)
Platelets: 242 10*3/uL (ref 150–400)
Platelets: 270 10*3/uL (ref 150–400)
Platelets: 272 10*3/uL (ref 150–400)
RBC: 3.56 MIL/uL — ABNORMAL LOW (ref 3.87–5.11)
RBC: 3.6 MIL/uL — ABNORMAL LOW (ref 3.87–5.11)
RBC: 3.74 MIL/uL — ABNORMAL LOW (ref 3.87–5.11)
RDW: 13.3 % (ref 11.5–15.5)
RDW: 13.2 % (ref 11.5–15.5)
RDW: 13.4 % (ref 11.5–15.5)
WBC: 7.2 10*3/uL (ref 4.0–10.5)
WBC: 7.2 10*3/uL (ref 4.0–10.5)
WBC: 9.4 10*3/uL (ref 4.0–10.5)

## 2015-06-20 LAB — MRSA PCR SCREENING: MRSA by PCR: NEGATIVE

## 2015-06-20 MED ORDER — TRAMADOL HCL 50 MG PO TABS
50.0000 mg | ORAL_TABLET | Freq: Four times a day (QID) | ORAL | Status: DC | PRN
  Administered 2015-06-20 – 2015-06-21 (×2): 50 mg via ORAL
  Filled 2015-06-20 (×2): qty 1

## 2015-06-20 NOTE — Progress Notes (Signed)
Initial Nutrition Assessment  DOCUMENTATION CODES:  Not applicable  INTERVENTION:  Magic cup q 24 hours, each supplement provides 290 kcal and 9 grams of protein  NUTRITION DIAGNOSIS:  Inadequate oral intake related to poor appetite as evidenced by per patient/family report.  GOAL:  Patient will meet greater than or equal to 90% of their needs  MONITOR:  PO intake, Supplement acceptance, Labs, I & O's  REASON FOR ASSESSMENT:  Malnutrition Screening Tool    ASSESSMENT:  82 y/o female PMHx AFIB, GERD, Depression, CHF who originally presented with 1x malaise. Workup reveals significant Afib. Admitted for rate control.   Pt reports that she "couldnt eat" for the last couple weeks. She says "I just didn't want anything". She did not appear to have negative symptoms associated with poor PO intake, rather she just did not have an appetite. She watched her sugar/sa;t at home, did not drink any oral supplements and took B12/Vitamin D3/Biotin.  She denies c/d. Has some nausea that is controlled with zofran  Pt reports her UBW is 169-170 lbs. Her scale at home recently read 164 lbs.   She still says she is not eating that much. She is somewhat scared of Ensure due to high amount of calories. Was agreeable to [employer].   NFPE: WDL  Labs reviewed: Triglycerides 155, HLD 37  Diet Order:  Diet Heart Room service appropriate?: Yes; Fluid consistency:: Thin  Skin:  Reviewed, no issues  Last BM:  3/9  Height:  Ht Readings from Last 1 Encounters:  06/18/15 5\' 5"  (1.651 m)   Weight:  Wt Readings from Last 1 Encounters:  06/20/15 169 lb 14.4 oz (77.066 kg)   Wt Readings from Last 10 Encounters:  06/20/15 169 lb 14.4 oz (77.066 kg)  06/18/15 168 lb 6.4 oz (76.386 kg)  06/12/15 168 lb 8 oz (76.431 kg)  06/04/15 173 lb 12 oz (78.812 kg)  12/29/14 173 lb 4 oz (78.586 kg)  10/23/14 175 lb (79.379 kg)  07/22/14 176 lb (79.833 kg)  01/14/14 175 lb 12.8 oz (79.742 kg)  08/26/13 175 lb  12 oz (79.72 kg)  01/10/13 170 lb 12.8 oz (77.474 kg)  Admit weight: 166 lbs (75.45 kg)   Ideal Body Weight:  56.82 kg  BMI:  Body mass index is 28.27 kg/(m^2).  Estimated Nutritional Needs:  Kcal:  1600-1750 kcals (21-23 kcal/kg bw) Protein:  62-74 g Pro (1.1-1.3 g/kg ibw) Fluid:  1.6-1.8 liters  EDUCATION NEEDS:  No education needs identified at this time  Christophe Louis RD, LDN Nutrition Pager: [phone] 06/20/2015 3:36 PM

## 2015-06-20 NOTE — Progress Notes (Signed)
Patient Name: [name] Date of Encounter: 06/20/2015  Active Problems:   Atrial fibrillation with RVR (HCC)   Length of Stay: 1  SUBJECTIVE  She continues to have some pain in the lower abdomen, but only with motion and while coughing.  CURRENT MEDS . diltiazem  120 mg Oral Daily  . famotidine  10 mg Oral Daily  . feeding supplement (ENSURE ENLIVE)  237 mL Oral BID BM  . metoprolol succinate  25 mg Oral Daily   OBJECTIVE  Filed Vitals:   06/20/15 0700 06/20/15 0800 06/20/15 0900 06/20/15 1100  BP: 124/65 122/65 122/72 110/60  Pulse: 90 91 91 93  Temp: 97.9 F (36.6 C)   98.2 F (36.8 C)  TempSrc: Oral   Oral  Resp: 21 21 19 16   Height:      Weight:      SpO2: 95% 93% 96% 95%    Intake/Output Summary (Last 24 hours) at 06/20/15 1214 Last data filed at 06/19/15 1900  Gross per 24 hour  Intake    250 ml  Output      0 ml  Net    250 ml   Filed Weights   06/18/15 1711 06/19/15 0500 06/20/15 0500  Weight: 166 lb (75.297 kg) 169 lb 15.6 oz (77.1 kg) 169 lb 14.4 oz (77.066 kg)   PHYSICAL EXAM  General: Pleasant, NAD. Neuro: Alert and oriented X 3. Moves all extremities spontaneously. Psych: Normal affect. HEENT:  Normal  Neck: Supple without bruits or JVD. Lungs:  Resp regular and unlabored, CTA. Heart: RRR no s3, s4, or murmurs. Abdomen: Soft, non-tender, non-distended, BS + x 4.  Extremities: No clubbing, cyanosis or edema. DP/PT/Radials 2+ and equal bilaterally.  Accessory Clinical Findings  CBC  Recent Labs  06/18/15 1040  06/20/15 0311 06/20/15 0817  WBC 7.0  < > 7.2 9.4  NEUTROABS 4.3  --   --   --   HGB 13.8  < > 10.5* 11.0*  HCT 41.2  < > 33.5* 34.6*  MCV 91.6  < > 93.1 92.5  PLT 235  < > 270 272  < > = values in this interval not displayed. Basic Metabolic Panel  Recent Labs  06/18/15 1040 06/19/15 0228  NA 139 140  K 3.1* 4.7  CL 98* 102  CO2 28 28  GLUCOSE 118* 106*  BUN 11 15  CREATININE 1.21* 1.05*  CALCIUM 9.1  9.1   Liver Function Tests  Recent Labs  06/18/15 1040  AST 38  ALT 26  ALKPHOS 68  BILITOT 0.9  PROT 6.2*  ALBUMIN 3.4*    Recent Labs  06/18/15 1725  HGBA1C 5.6   Fasting Lipid Panel  Recent Labs  06/19/15 0228  CHOL 142  HDL 37*  LDLCALC 74  TRIG 027*  CHOLHDL 3.8   Radiology/Studies  Dg Chest Portable 1 View  06/18/2015  CLINICAL DATA:  Shortness of breath for 10 days. EXAM: PORTABLE CHEST 1 VIEW COMPARISON:  PA and lateral chest 05/17/2008. FINDINGS: The lungs are clear. Heart size is normal. No pneumothorax or pleural effusion. IMPRESSION: No acute disease.   Ct Angio Abd/pel W/ And/or W/o  06/19/2015  CLINICAL DATA:  82 year old female with acute right abdominal and pain following TEE cardioversion and heparin administration.  IMPRESSION: 6 x 7 x 12.5 cm right rectus sheath hematoma within the pelvis with active arterial extravasation. Aortic atherosclerosis without aneurysm. Patent visualized mesenteric arteries. These results were discussed with Dr. Mayford Knife on 06/19/2015  TELE: SR, PVCs, couplets, frequent short runs of nsVTs- 3-4 runs     ASSESSMENT AND PLAN   1. AFib with RVR - s/p TEE/ successful DCCV on 3/10, developed rectus sheath hematoma post procedure, off anticoagulation now, no surgery per surgical consult, Hb stable.  I would observe another night and ig Hb stable, discharge home tomorrow.  2. Rectus hematoma - as above  3. Chronic Systolic CHF - Hx of NICM. EF has recovered in the past. EF 50-55% by echo in 2015. No evidence of volume excess. Will need repeat Echo once HR better controlled.   4. LBBB - No CAD on cath in 2010. Chronic.     Signed, Lars Masson MD, [facility] 06/20/2015

## 2015-06-20 NOTE — Progress Notes (Signed)
[facility] Progress Note 1 Day Post-Op  Subjective: Patient doing much better this AM.  Started on heart healthy diet.  Objective: Vital signs in last 24 hours: Temp:  [97.8 F (36.6 C)-98.2 F (36.8 C)] 97.9 F (36.6 C) (03/11 0700) Pulse Rate:  [74-90] 90 (03/11 0700) Resp:  [16-22] 21 (03/11 0700) BP: (101-127)/(53-76) 124/65 mmHg (03/11 0700) SpO2:  [92 %-97 %] 95 % (03/11 0700) Weight:  [77.066 kg (169 lb 14.4 oz)] 77.066 kg (169 lb 14.4 oz) (03/11 0500) Last BM Date: 06/18/15  Intake/Output from previous day: 03/10 0701 - 03/11 0700 In: 250 [I.V.:250] Out: 0  Intake/Output this shift:    General: No acute distress, but still has pain with movement, only up to 5/10  Lungs: clear to auscultatiion  Abd: Tender on the right lower rectus area.  Excellent bowel sounds.  Okay to eat.  Extremities: No changes  Neuro: Intact  Lab Results:  @LABLAST2 (wbc:2,hgb:2,hct:2,plt:2) BMET ) Recent Labs  06/18/15 1040 06/19/15 0228  NA 139 140  K 3.1* 4.7  CL 98* 102  CO2 28 28  GLUCOSE 118* 106*  BUN 11 15  CREATININE 1.21* 1.05*  CALCIUM 9.1 9.1   PT/INR  Recent Labs  06/19/15 0228  LABPROT 14.8  INR 1.14   ABG No results for input(s): PHART, HCO3 in the last 72 hours.  Invalid input(s): PCO2, PO2  Studies/Results: Dg Chest Portable 1 View  06/18/2015  CLINICAL DATA:  Shortness of breath for 10 days. EXAM: PORTABLE CHEST 1 VIEW COMPARISON:  PA and lateral chest 05/17/2008. FINDINGS: The lungs are clear. Heart size is normal. No pneumothorax or pleural effusion. IMPRESSION: No acute disease. Electronically Signed   By: Drusilla Kanner M.D.   On: 06/18/2015 11:25   Ct Angio Abd/pel W/ And/or W/o  06/19/2015  CLINICAL DATA:  82 year old female with acute right abdominal and pain following TEE cardioversion and heparin administration. EXAM: CTA ABDOMEN AND PELVIS WITHOUT AND WITH CONTRAST TECHNIQUE: Multidetector CT imaging of the abdomen and pelvis was performed  using the standard protocol during bolus administration of intravenous contrast. Multiplanar reconstructed images and MIPs were obtained and reviewed to evaluate the vascular anatomy. CONTRAST:  OMNIPAQUE IOHEXOL 350 MG/ML SOLN COMPARISON:  06/03/2008 FINDINGS: Lower chest:  No acute abnormalities Hepatobiliary: The liver and gallbladder are unremarkable. There is no evidence of biliary dilatation. Pancreas: Unremarkable Spleen: Unremarkable Adrenals/Urinary Tract: Mild cortical thinning noted without other renal abnormality. The adrenal glands are unremarkable. Mild circumferential bladder wall thickening again noted. Stomach/Bowel: Unremarkable except for a few scattered colonic diverticula. Vascular/Lymphatic: Aortic atherosclerosis noted without evidence of aneurysm. The visualized mesenteric arteries are patent. No enlarged lymph nodes identified. Reproductive: The patient is status post hysterectomy. No adnexal masses. Other:   There is no evidence of free fluid or pneumoperitoneum. Musculoskeletal: A 6 x 7 x 12.5 cm right rectus sheath hematoma within the pelvis noted with evidence of active arterial extravasation. No acute or suspicious bony abnormalities noted. Degenerative changes within the lumbar spine are present. Review of the MIP images confirms the above findings. IMPRESSION: 6 x 7 x 12.5 cm right rectus sheath hematoma within the pelvis with active arterial extravasation. Aortic atherosclerosis without aneurysm. Patent visualized mesenteric arteries. These results were discussed with Dr. Mayford Knife on 06/19/2015 at 5 45 p.m. Electronically Signed   By: Harmon Pier M.D.   On: 06/19/2015 18:04    Anti-infectives: Anti-infectives    None      Assessment/Plan: s/p Procedure(s): TRANSESOPHAGEAL ECHOCARDIOGRAM (TEE) CARDIOVERSION  Advance diet Hold anticoagulation as long as possible, but at least 72 hours.  LOS: 1 day   Marta Lamas. Gae Bon, MD,  FACS [phone] [facility] 06/20/2015

## 2015-06-21 MED ORDER — DIGOXIN 125 MCG PO TABS
0.1250 mg | ORAL_TABLET | Freq: Every day | ORAL | Status: DC
  Administered 2015-06-22 – 2015-06-23 (×2): 0.125 mg via ORAL
  Filled 2015-06-21 (×2): qty 1

## 2015-06-21 MED ORDER — DILTIAZEM HCL 100 MG IV SOLR
5.0000 mg/h | INTRAVENOUS | Status: DC
  Administered 2015-06-21 (×2): 5 mg/h via INTRAVENOUS
  Administered 2015-06-22: 10 mg/h via INTRAVENOUS
  Administered 2015-06-22: 5 mg/h via INTRAVENOUS
  Filled 2015-06-21 (×4): qty 100

## 2015-06-21 MED ORDER — DIGOXIN 0.25 MG/ML IJ SOLN
0.2500 mg | Freq: Once | INTRAMUSCULAR | Status: AC
Start: 2015-06-21 — End: 2015-06-21
  Administered 2015-06-21: 0.25 mg via INTRAVENOUS
  Filled 2015-06-21: qty 2

## 2015-06-21 MED ORDER — OFF THE BEAT BOOK
Freq: Once | Status: AC
  Administered 2015-06-21: 15:00:00
  Filled 2015-06-21: qty 1

## 2015-06-21 NOTE — Progress Notes (Signed)
Patient Name: [name] Date of Encounter: 06/21/2015  Active Problems:   Atrial fibrillation with RVR (HCC)   Chronic diastolic CHF (congestive heart failure), NYHA class 2 (HCC)   Rectus sheath hematoma   Length of Stay: 2  SUBJECTIVE  She continues to have some pain in the lower abdomen,she feels more SOB today, she went back to a-fib with RVR and was started on cardizem drip at 10 mg/hr..  CURRENT MEDS . famotidine  10 mg Oral Daily  . feeding supplement (ENSURE ENLIVE)  237 mL Oral BID BM  . metoprolol succinate  25 mg Oral Daily   OBJECTIVE  Filed Vitals:   06/21/15 0730 06/21/15 0800 06/21/15 0900 06/21/15 1100  BP: 113/69 99/56 110/67 99/49  Pulse: 102 119 72 134  Temp:    98.6 F (37 C)  TempSrc:    Oral  Resp: 17 20 27 17   Height:      Weight:      SpO2: 92% 93% 96% 98%    Intake/Output Summary (Last 24 hours) at 06/21/15 1147 Last data filed at 06/21/15 0600  Gross per 24 hour  Intake 759.75 ml  Output      0 ml  Net 759.75 ml   Filed Weights   06/19/15 0500 06/20/15 0500 06/21/15 0349  Weight: 169 lb 15.6 oz (77.1 kg) 169 lb 14.4 oz (77.066 kg) 163 lb 12.8 oz (74.3 kg)   PHYSICAL EXAM  General: Pleasant, NAD. Neuro: Alert and oriented X 3. Moves all extremities spontaneously. Psych: Normal affect. HEENT:  Normal  Neck: Supple without bruits or JVD. Lungs:  Resp regular and unlabored, CTA. Heart: RRR no s3, s4, or murmurs. Abdomen: Soft, non-tender, non-distended, BS + x 4.  Extremities: No clubbing, cyanosis or edema. DP/PT/Radials 2+ and equal bilaterally.  Accessory Clinical Findings  CBC  Recent Labs  06/20/15 0311 06/20/15 0817  WBC 7.2 9.4  HGB 10.5* 11.0*  HCT 33.5* 34.6*  MCV 93.1 92.5  PLT 270 272   Basic Metabolic Panel  Recent Labs  06/19/15 0228  NA 140  K 4.7  CL 102  CO2 28  GLUCOSE 106*  BUN 15  CREATININE 1.05*  CALCIUM 9.1    Recent Labs  06/18/15 1725  HGBA1C 5.6   Fasting Lipid  Panel  Recent Labs  06/19/15 0228  CHOL 142  HDL 37*  LDLCALC 74  TRIG 859*  CHOLHDL 3.8   Radiology/Studies  Dg Chest Portable 1 View  06/18/2015  CLINICAL DATA:  Shortness of breath for 10 days. EXAM: PORTABLE CHEST 1 VIEW COMPARISON:  PA and lateral chest 05/17/2008. FINDINGS: The lungs are clear. Heart size is normal. No pneumothorax or pleural effusion. IMPRESSION: No acute disease.   Ct Angio Abd/pel W/ And/or W/o  06/19/2015  CLINICAL DATA:  82 year old female with acute right abdominal and pain following TEE cardioversion and heparin administration.  IMPRESSION: 6 x 7 x 12.5 cm right rectus sheath hematoma within the pelvis with active arterial extravasation. Aortic atherosclerosis without aneurysm. Patent visualized mesenteric arteries. These results were discussed with Dr. Mayford Knife on 06/19/2015   TELE: SR, PVCs, couplets, frequent short runs of nsVTs- 3-4 runs     ASSESSMENT AND PLAN   1. AFib with RVR - s/p TEE/ DCCV on 3/10, developed rectus sheath hematoma post procedure, off anticoagulation now, no surgery per surgical consult, Hb stable. Now back in a-fib with RVR , her BP is low, started on cardizem drip at 10 mg/hr. I  would add digoxin 0.25 iv x 1, then 0.125 mg po daily.  2. Rectus hematoma - as above, Hb stable  3. Chronic Systolic CHF - Hx of NICM. EF has recovered in the past. EF 50-55% by echo in 2015. No evidence of volume excess. Will need repeat Echo once HR better controlled.   4. LBBB - No CAD on cath in 2010. Chronic.    Signed, Lars Masson MD, [facility] 06/21/2015

## 2015-06-21 NOTE — Progress Notes (Signed)
SBP-85/58 Cardizem drip titrated down to 5mg /hr.  PA Barret  Made aware.Will closely monitor

## 2015-06-21 NOTE — Significant Event (Signed)
Patient now in A-Fib w/ RVR confirmed via EKG.  Dr. Virgina Organ notified, patient remains asymptomatic at this time.  HR 115-138.  BP 117/59 mmHg  Pulse 119  Temp(Src) 98.5 F (36.9 C) (Oral)  Resp 22  Ht 5\' 5"  (1.651 m)  Wt 74.3 kg (163 lb 12.8 oz)  BMI 27.26 kg/m2  SpO2 94%

## 2015-06-22 ENCOUNTER — Ambulatory Visit: Payer: Medicare Other | Admitting: Physician Assistant

## 2015-06-22 MED ORDER — METOPROLOL SUCCINATE ER 50 MG PO TB24
50.0000 mg | ORAL_TABLET | Freq: Every day | ORAL | Status: DC
  Administered 2015-06-22 – 2015-06-24 (×3): 50 mg via ORAL
  Filled 2015-06-22 (×3): qty 1

## 2015-06-22 NOTE — Care Management Important Message (Signed)
Important Message  Patient Details  Name: ADAJAH MAIETTA MRN: [id number] Date of Birth: [birth year]   Medicare Important Message Given:  Other (see comment) (not given per ncm)    Yashar Inclan P Miguel Christiana 06/22/2015, 12:04 PM

## 2015-06-22 NOTE — Progress Notes (Signed)
Patient Name: [name] Date of Encounter: 06/22/2015  Active Problems:   Atrial fibrillation with RVR (HCC)   Chronic diastolic CHF (congestive heart failure), NYHA class 2 (HCC)   Rectus sheath hematoma   Length of Stay: 3  SUBJECTIVE  82 yo female admitted from the office 06/18/15 with rapid atrial fib Was started on heparin anticoagulation.  She had TEE cardioversion on 06/19/15. She developed a rectus sheath hematoma and the anticoagulation was stopped   HR is still elevated   CURRENT MEDS . digoxin  0.125 mg Oral Daily  . famotidine  10 mg Oral Daily  . feeding supplement (ENSURE ENLIVE)  237 mL Oral BID BM  . metoprolol succinate  25 mg Oral Daily   OBJECTIVE  Filed Vitals:   06/22/15 0313 06/22/15 0400 06/22/15 0445 06/22/15 0800  BP:  98/49  102/55  Pulse:  133  95  Temp: 98.5 F (36.9 C)   98.1 F (36.7 C)  TempSrc: Oral   Oral  Resp:  19  27  Height:      Weight:   171 lb 9.6 oz (77.837 kg)   SpO2:  91%  95%    Intake/Output Summary (Last 24 hours) at 06/22/15 0833 Last data filed at 06/22/15 0500  Gross per 24 hour  Intake 503.38 ml  Output    425 ml  Net  78.38 ml   Filed Weights   06/20/15 0500 06/21/15 0349 06/22/15 0445  Weight: 169 lb 14.4 oz (77.066 kg) 163 lb 12.8 oz (74.3 kg) 171 lb 9.6 oz (77.837 kg)   PHYSICAL EXAM  General: Pleasant, NAD. Neuro: Alert and oriented X 3. Moves all extremities spontaneously. Psych: Normal affect. HEENT:  Normal  Neck: Supple without bruits or JVD. Lungs:  Resp regular and unlabored, CTA. Heart: irreg. Irreg. , tachycardic Abdomen:  Tender in mid abdominal , slightly worse on the right   Extremities: No clubbing, cyanosis or edema. DP/PT/Radials 2+ and equal bilaterally.  Accessory Clinical Findings  CBC  Recent Labs  06/20/15 0311 06/20/15 0817  WBC 7.2 9.4  HGB 10.5* 11.0*  HCT 33.5* 34.6*  MCV 93.1 92.5  PLT 270 272   Basic Metabolic Panel No results for input(s): NA, K, CL,  CO2, GLUCOSE, BUN, CREATININE, CALCIUM, MG, PHOS in the last 72 hours. No results for input(s): HGBA1C in the last 72 hours. Fasting Lipid Panel No results for input(s): CHOL, HDL, LDLCALC, TRIG, CHOLHDL, LDLDIRECT in the last 72 hours. Radiology/Studies  Dg Chest Portable 1 View  06/18/2015  CLINICAL DATA:  Shortness of breath for 10 days. EXAM: PORTABLE CHEST 1 VIEW COMPARISON:  PA and lateral chest 05/17/2008. FINDINGS: The lungs are clear. Heart size is normal. No pneumothorax or pleural effusion. IMPRESSION: No acute disease.   Ct Angio Abd/pel W/ And/or W/o  06/19/2015  CLINICAL DATA:  82 year old female with acute right abdominal and pain following TEE cardioversion and heparin administration.  IMPRESSION: 6 x 7 x 12.5 cm right rectus sheath hematoma within the pelvis with active arterial extravasation. Aortic atherosclerosis without aneurysm. Patent visualized mesenteric arteries. These results were discussed with Dr. Mayford Knife on 06/19/2015   TELE: reviewed by me.   Atrial fib at 113.    ASSESSMENT AND PLAN   1. AFib with RVR - s/p TEE/ DCCV on 3/10, developed rectus sheath hematoma post procedure, off anticoagulation now, no surgery per surgical consult, Hb stable. Now back in a-fib with RVR , her BP is low, started on  cardizem drip at 5 mg/hr. I would add digoxin 0.25 iv x 1, then 0.125 mg po daily.  2. Rectus hematoma - as above, Hb stable It appears that she will not be able to tolerate anticoagulation.  3. Chronic Systolic CHF - Hx of NICM. EF has recovered in the past. EF 50-55% by echo in 2015. No evidence of volume excess. Will need repeat Echo once HR better controlled.   4. LBBB - No CAD on cath in 2010. Chronic.    Signed, Myrical Andujo, Deloris Ping MD, [facility] 06/22/2015

## 2015-06-23 MED ORDER — DIGOXIN 125 MCG PO TABS
0.2500 mg | ORAL_TABLET | Freq: Every day | ORAL | Status: DC
  Administered 2015-06-24: 0.25 mg via ORAL
  Filled 2015-06-23: qty 2

## 2015-06-23 MED ORDER — DILTIAZEM HCL 60 MG PO TABS
60.0000 mg | ORAL_TABLET | Freq: Three times a day (TID) | ORAL | Status: DC
  Administered 2015-06-23 – 2015-06-24 (×3): 60 mg via ORAL
  Filled 2015-06-23 (×3): qty 1

## 2015-06-23 MED ORDER — METOPROLOL TARTRATE 25 MG PO TABS: 25.0000 mg | ORAL_TABLET | Freq: Two times a day (BID) | ORAL | Status: DC

## 2015-06-23 NOTE — Progress Notes (Signed)
Patient has arrived on unit from 2H with RN and NT. Patient oriented to unit, assessed, placed on tele, VS were stable.

## 2015-06-23 NOTE — Progress Notes (Signed)
Order for transfer acknowledged Rm 2W 29 available. Cardizem gtt weaned off, VSS. Pt informed and verbalized understanding. Belongings packed. Pt transported via wheelchair.  Report called to 2W RN

## 2015-06-23 NOTE — Progress Notes (Signed)
Patient Name: [name] Date of Encounter: 06/23/2015  Active Problems:   Atrial fibrillation with RVR (HCC)   Chronic diastolic CHF (congestive heart failure), NYHA class 2 (HCC)   Rectus sheath hematoma   Length of Stay: 4  SUBJECTIVE  82 yo female admitted from the office 06/18/15 with rapid atrial fib Was started on heparin anticoagulation.  She had TEE cardioversion on 06/19/15. She developed a rectus sheath hematoma and the anticoagulation was stopped   HR is still elevated  Her I V became infiltrated and the cardizem was stopped  HR is rapid as a result of that.   CURRENT MEDS . digoxin  0.125 mg Oral Daily  . famotidine  10 mg Oral Daily  . feeding supplement (ENSURE ENLIVE)  237 mL Oral BID BM  . metoprolol succinate  50 mg Oral Daily   OBJECTIVE  Filed Vitals:   06/22/15 2357 06/23/15 0400 06/23/15 0515 06/23/15 0740  BP:    91/61  Pulse:    88  Temp: 98.6 F (37 C) 98.7 F (37.1 C)  97.7 F (36.5 C)  TempSrc: Oral Oral  Oral  Resp:    22  Height:      Weight:   171 lb 1.6 oz (77.61 kg)   SpO2:    97%    Intake/Output Summary (Last 24 hours) at 06/23/15 0808 Last data filed at 06/23/15 0500  Gross per 24 hour  Intake    714 ml  Output    400 ml  Net    314 ml   Filed Weights   06/21/15 0349 06/22/15 0445 06/23/15 0515  Weight: 163 lb 12.8 oz (74.3 kg) 171 lb 9.6 oz (77.837 kg) 171 lb 1.6 oz (77.61 kg)   PHYSICAL EXAM  General: Pleasant, NAD. Neuro: Alert and oriented X 3. Moves all extremities spontaneously. Psych: Normal affect. HEENT:  Normal  Neck: Supple without bruits or JVD. Lungs:  Resp regular and unlabored, CTA. Heart: irreg. Irreg. , tachycardic Abdomen:  Tender in mid abdominal , slightly worse on the right   Extremities: No clubbing, cyanosis or edema. DP/PT/Radials 2+ and equal bilaterally.  Accessory Clinical Findings  CBC  Recent Labs  06/20/15 0817  WBC 9.4  HGB 11.0*  HCT 34.6*  MCV 92.5  PLT 272    Basic Metabolic Panel No results for input(s): NA, K, CL, CO2, GLUCOSE, BUN, CREATININE, CALCIUM, MG, PHOS in the last 72 hours. No results for input(s): HGBA1C in the last 72 hours. Fasting Lipid Panel No results for input(s): CHOL, HDL, LDLCALC, TRIG, CHOLHDL, LDLDIRECT in the last 72 hours. Radiology/Studies  Dg Chest Portable 1 View  06/18/2015  CLINICAL DATA:  Shortness of breath for 10 days. EXAM: PORTABLE CHEST 1 VIEW COMPARISON:  PA and lateral chest 05/17/2008. FINDINGS: The lungs are clear. Heart size is normal. No pneumothorax or pleural effusion. IMPRESSION: No acute disease.   Ct Angio Abd/pel W/ And/or W/o  06/19/2015  CLINICAL DATA:  82 year old female with acute right abdominal and pain following TEE cardioversion and heparin administration.  IMPRESSION: 6 x 7 x 12.5 cm right rectus sheath hematoma within the pelvis with active arterial extravasation. Aortic atherosclerosis without aneurysm. Patent visualized mesenteric arteries. These results were discussed with Dr. Mayford Knife on 06/19/2015   TELE: reviewed by me.   Atrial fib at 113.    ASSESSMENT AND PLAN   1. AFib with RVR - s/p TEE/ DCCV on 3/10, developed rectus sheath hematoma post procedure, off anticoagulation  now, no surgery per surgical consult, Hb stable. Now back in a-fib with RVR , her BP is low, Cardizem is now off.    Is on her 2nd day of toprol XL and dig Rate is still a little fast but better  Transfer to tele Possible DC later today vs. Tomorrow depending on her HR  She would like to go home today    2. Rectus hematoma - as above, Hb stable It appears that she will not be able to tolerate anticoagulation.  3. Chronic Systolic CHF - Hx of NICM. EF has recovered in the past. EF 50-55% by echo in 2015. No evidence of volume excess. Will need repeat Echo once HR better controlled.   4. LBBB - No CAD on cath in 2010. Chronic.    Signed, Melena Hayes, Deloris Ping MD, [facility] 06/23/2015

## 2015-06-24 ENCOUNTER — Ambulatory Visit: Payer: Medicare Other | Admitting: Cardiovascular Disease

## 2015-06-24 ENCOUNTER — Encounter: Payer: Self-pay | Admitting: Cardiovascular Disease

## 2015-06-24 MED ORDER — TORSEMIDE 20 MG PO TABS: 10.0000 mg | ORAL_TABLET | Freq: Every day | ORAL | Status: DC | PRN

## 2015-06-24 MED ORDER — METOPROLOL SUCCINATE ER 50 MG PO TB24: 50.0000 mg | ORAL_TABLET | Freq: Every day | ORAL | Status: DC

## 2015-06-24 MED ORDER — APIXABAN 2.5 MG PO TABS
2.5000 mg | ORAL_TABLET | Freq: Two times a day (BID) | ORAL | Status: DC
  Administered 2015-06-24: 2.5 mg via ORAL
  Filled 2015-06-24: qty 1

## 2015-06-24 MED ORDER — PANTOPRAZOLE SODIUM 40 MG PO TBEC: 40.0000 mg | DELAYED_RELEASE_TABLET | Freq: Every day | ORAL | Status: DC

## 2015-06-24 MED ORDER — DIGOXIN 125 MCG PO TABS: 0.1250 mg | ORAL_TABLET | Freq: Every day | ORAL | Status: DC

## 2015-06-24 MED ORDER — DILTIAZEM HCL ER COATED BEADS 180 MG PO CP24: 180.0000 mg | ORAL_CAPSULE | Freq: Every day | ORAL | Status: DC

## 2015-06-24 MED ORDER — DILTIAZEM HCL ER COATED BEADS 180 MG PO CP24
180.0000 mg | ORAL_CAPSULE | Freq: Every day | ORAL | Status: DC
  Administered 2015-06-24: 180 mg via ORAL
  Filled 2015-06-24: qty 1

## 2015-06-24 MED ORDER — APIXABAN 2.5 MG PO TABS: 2.5000 mg | ORAL_TABLET | Freq: Two times a day (BID) | ORAL | Status: DC

## 2015-06-24 MED ORDER — ENSURE ENLIVE PO LIQD: 237.0000 mL | Freq: Two times a day (BID) | ORAL | Status: DC

## 2015-06-24 NOTE — Progress Notes (Signed)
Subjective:  Up without problems. Her main complaint is persistent cough  Objective:  Vital Signs in the last 24 hours: Temp:  [97.7 F (36.5 C)-98.9 F (37.2 C)] 97.9 F (36.6 C) (03/15 0518) Pulse Rate:  [73-116] 74 (03/15 0518) Resp:  [16-28] 18 (03/15 0518) BP: (89-122)/(48-75) 114/60 mmHg (03/15 0518) SpO2:  [91 %-100 %] 98 % (03/15 0518)  Intake/Output from previous day:  Intake/Output Summary (Last 24 hours) at 06/24/15 0730 Last data filed at 06/23/15 1400  Gross per 24 hour  Intake 157.25 ml  Output      0 ml  Net 157.25 ml    Physical Exam: General appearance: alert, cooperative, no distress and mildly obese Neck: no JVD Lungs: few crackles Lt base Heart: irregularly irregular rhythm Abdomen: soft, non-tender; bowel sounds normal; no masses,  no organomegaly Extremities: no edema Skin: pale, cool, dry Neurologic: Grossly normal   Rate: 90  Rhythm: atrial fibrillation  Lab Results: No results for input(s): WBC, HGB, PLT in the last 72 hours. No results for input(s): NA, K, CL, CO2, GLUCOSE, BUN, CREATININE in the last 72 hours. No results for input(s): TROPONINI in the last 72 hours.  Invalid input(s): CK, MB No results for input(s): INR in the last 72 hours.  Scheduled Meds: . digoxin  0.25 mg Oral Daily  . diltiazem  60 mg Oral 3 times per day  . famotidine  10 mg Oral Daily  . feeding supplement (ENSURE ENLIVE)  237 mL Oral BID BM  . metoprolol succinate  50 mg Oral Daily   Continuous Infusions:  PRN Meds:.acetaminophen, HYDROmorphone (DILAUDID) injection, ondansetron (ZOFRAN) IV, ondansetron, pantoprazole, traMADol   Imaging: Imaging results have been reviewed   Assessment/Plan:  82 y.o. female with a hx of PAF, NICM, systolic HF, LBBB, prior spontaneous retroperitoneal bleed on Coumadin with recent PAF. She had TEE CV 06/19/15 then spontaneous RTB while on Heparin and anticoagulation was stopped. She is now back in AF.   Principal  Problem:   Rectus sheath hematoma- no anticoagulation Active Problems:   Atrial fibrillation with RVR- CHADs VASc=4   Chronic diastolic CHF (congestive heart failure), NYHA class 2 (HCC)   Cough   PLAN: Change Diltiazem to CD 180, ? Home today on Dig, Toprol, and Diltiazem.   . Not sure of the etiology of her cough- she does not appear to be in CHF on exam. She had been on a diuretic but stopped it secondary to "dizzyness" - it did not help. Consider resuming her diuretic at a lower dose. She is on a PPI.   Corine Shelter PA-C 06/24/2015, 7:30 AM [phone]  Attending Note:   The patient was seen and examined.  Agree with assessment and plan as noted above.  Changes made to the above note as needed.  Pt is generally better.   HR is well controlled.  Is a poor  candidate for anticoagulation  - had a painful rectus bleed on coumadin - which occurred after the cardioversion   ( was not spontaneous ) .   HR is well controlled - she has gone back into afib   Have discuss with [name]. Will try low dose Eliquis - which will be better than ASA We discussed watchman   Both of her major bleeds occurred after a procedure and neither were life threatening ( no intracranial bleed and no GI bleed )  I think that we should try some anticoagulation   She will see [name] in  the office      Nahser, Deloris Ping, MD  06/24/2015 9:57 AM    [facility] [address] [place],  [address] [place], [place]  [address] Pager [phone] Phone: [phone]; Fax: [phone]   [facility]  [address] [place], [place]  [address] [phone]   Fax [phone]     Vesta Mixer, Montez Hageman., MD, [facility] 06/24/2015, 9:38 AM [address]  [address] Office [phone] Pager [phone]

## 2015-06-24 NOTE — Discharge Summary (Signed)
Discharge Summary    Patient ID: [name],  MRN: [id number], DOB/AGE: [birth year] 80 y.o.  Admit date: 06/18/2015 Discharge date: 06/24/2015  Primary Care Provider: Crawford Givens Primary Cardiologist: Dr Eden Emms  Discharge Diagnoses    Principal Problem:   Rectus sheath hematoma- no anticoagulation Active Problems:   Atrial fibrillation with RVR- CHADs VASc=4   Chronic diastolic CHF (congestive heart failure), NYHA class 2 (HCC)   Cough   Allergies Allergies  Allergen Reactions  . Ace Inhibitors     REACTION: cough  . Codeine Other (See Comments)    sedation  . Delsym [Dextromethorphan Polistirex Er] Other (See Comments)    dizziness  . Sulfamethoxazole-Trimethoprim     REACTION: GI intolerance.  . Warfarin Sodium Other (See Comments)    "bleed out"    Diagnostic Studies/Procedures    TEE 06/19/15 CT abdomin 06/19/15 _____________   History of Present Illness    82 y/o female admitted for TEE CV 39/17   Hospital Course     Consultants: Dr Luisa Hart with Gen Surgery 06/19/15  82 y.o. female with a hx of PAF, NICM, systolic HF, LBBB, prior spontaneous retroperitoneal bleed on Coumadin, admtted 06/18/15 through the ED with weakness and dizziness and found to have AF with RVR.  She was admitted for rate control and set up for TEE CV 06/19/15. She had TEE CV 06/19/15 then had a right rectus sheath hematoma while on Heparin and anticoagulation was stopped. She was seen in consult by Dr Luisa Hart who did not feel surgery was indicated. She did not require transfusion. She then reverted back to AF. After discussion with Dr Eden Emms and Dr Elease Hashimoto the plan will be for low dose Eliquis and rate control. She'll follow up in the office in 1-2- weeks.  _____________  Discharge Vitals Blood pressure 114/60, pulse 74, temperature 97.9 F (36.6 C), temperature source Oral, resp. rate 18, height  (1.651 m), weight 171 lb 1.6 oz (77.61 kg), SpO2 98 %.  Filed Weights   06/21/15  0349 06/22/15 0445 06/23/15 0515  Weight: 163 lb 12.8 oz (74.3 kg) 171 lb 9.6 oz (77.837 kg) 171 lb 1.6 oz (77.61 kg)    Labs & Radiologic Studies     CBC No results for input(s): WBC, NEUTROABS, HGB, HCT, MCV, PLT in the last 72 hours. Basic Metabolic Panel No results for input(s): NA, K, CL, CO2, GLUCOSE, BUN, CREATININE, CALCIUM, MG, PHOS in the last 72 hours. Liver Function Tests No results for input(s): AST, ALT, ALKPHOS, BILITOT, PROT, ALBUMIN in the last 72 hours. No results for input(s): LIPASE, AMYLASE in the last 72 hours. Cardiac Enzymes No results for input(s): CKTOTAL, CKMB, CKMBINDEX, TROPONINI in the last 72 hours. BNP Invalid input(s): POCBNP D-Dimer No results for input(s): DDIMER in the last 72 hours. Hemoglobin A1C No results for input(s): HGBA1C in the last 72 hours. Fasting Lipid Panel No results for input(s): CHOL, HDL, LDLCALC, TRIG, CHOLHDL, LDLDIRECT in the last 72 hours. Thyroid Function Tests No results for input(s): TSH, T4TOTAL, T3FREE, THYROIDAB in the last 72 hours.  Invalid input(s): FREET3  Dg Chest Portable 1 View  06/18/2015  CLINICAL DATA:  Shortness of breath for 10 days. EXAM: PORTABLE CHEST 1 VIEW COMPARISON:  PA and lateral chest 05/17/2008. FINDINGS: The lungs are clear. Heart size is normal. No pneumothorax or pleural effusion. IMPRESSION: No acute disease. Electronically Signed   By: Drusilla Kanner M.D.   On: 06/18/2015 11:25   Ct Angio Abd/pel  W/ And/or W/o  06/19/2015  CLINICAL DATA:  82 year old female with acute right abdominal and pain following TEE cardioversion and heparin administration. EXAM: CTA ABDOMEN AND PELVIS WITHOUT AND WITH CONTRAST TECHNIQUE: Multidetector CT imaging of the abdomen and pelvis was performed using the standard protocol during bolus administration of intravenous contrast. Multiplanar reconstructed images and MIPs were obtained and reviewed to evaluate the vascular anatomy. CONTRAST:  OMNIPAQUE IOHEXOL  350 MG/ML SOLN COMPARISON:  06/03/2008 FINDINGS: Lower chest:  No acute abnormalities Hepatobiliary: The liver and gallbladder are unremarkable. There is no evidence of biliary dilatation. Pancreas: Unremarkable Spleen: Unremarkable Adrenals/Urinary Tract: Mild cortical thinning noted without other renal abnormality. The adrenal glands are unremarkable. Mild circumferential bladder wall thickening again noted. Stomach/Bowel: Unremarkable except for a few scattered colonic diverticula. Vascular/Lymphatic: Aortic atherosclerosis noted without evidence of aneurysm. The visualized mesenteric arteries are patent. No enlarged lymph nodes identified. Reproductive: The patient is status post hysterectomy. No adnexal masses. Other:   There is no evidence of free fluid or pneumoperitoneum. Musculoskeletal: A 6 x 7 x 12.5 cm right rectus sheath hematoma within the pelvis noted with evidence of active arterial extravasation. No acute or suspicious bony abnormalities noted. Degenerative changes within the lumbar spine are present. Review of the MIP images confirms the above findings. IMPRESSION: 6 x 7 x 12.5 cm right rectus sheath hematoma within the pelvis with active arterial extravasation. Aortic atherosclerosis without aneurysm. Patent visualized mesenteric arteries. These results were discussed with Dr. Mayford Knife on 06/19/2015 at 5 45 p.m. Electronically Signed   By: Harmon Pier M.D.   On: 06/19/2015 18:04    Disposition   Pt is being discharged home today in good condition.  Follow-up Plans & Appointments    Follow-up Information    Follow up with Charlton Haws, MD.   Specialty:  Cardiology   Why:  office will contact you   Contact information:   [address] [place] [address] [phone]        Discharge Medications   Current Discharge Medication List    START taking these medications   Details  apixaban (ELIQUIS) 2.5 MG TABS tablet Take 1 tablet (2.5 mg total) by mouth 2  (two) times daily. Qty: 60 tablet, Refills: 11    digoxin (LANOXIN) 0.125 MG tablet Take 1 tablet (0.125 mg total) by mouth daily. Qty: 30 tablet, Refills: 11    diltiazem (CARDIZEM CD) 180 MG 24 hr capsule Take 1 capsule (180 mg total) by mouth daily. Qty: 30 capsule, Refills: 11    feeding supplement, ENSURE ENLIVE, (ENSURE ENLIVE) LIQD Take 237 mLs by mouth 2 (two) times daily between meals. Qty: 237 mL, Refills: 12    pantoprazole (PROTONIX) 40 MG tablet Take 1 tablet (40 mg total) by mouth daily. Qty: 30 tablet, Refills: 11      CONTINUE these medications which have CHANGED   Details  metoprolol succinate (TOPROL-XL) 50 MG 24 hr tablet Take 1 tablet (50 mg total) by mouth daily. Take with or immediately following a meal. Qty: 30 tablet, Refills: 11    torsemide (DEMADEX) 20 MG tablet Take 0.5 tablets (10 mg total) by mouth daily as needed (weight gain 3 lbs or more, edema). Reported on 06/18/2015      CONTINUE these medications which have NOT CHANGED   Details  acetaminophen (TYLENOL) 650 MG CR tablet Take 650 mg by mouth every 8 (eight) hours as needed for pain.    omeprazole (PRILOSEC OTC) 20 MG tablet  Take 20 mg by mouth daily as needed (HEArtburn).     ondansetron (ZOFRAN-ODT) 4 MG disintegrating tablet Take 4 mg by mouth every 8 (eight) hours as needed for nausea or vomiting.      STOP taking these medications     aspirin 81 MG tablet      pseudoephedrine (SUDAFED) 30 MG tablet      ranitidine (ZANTAC) 150 MG tablet             Outstanding Labs/Studies    Duration of Discharge Encounter   Greater than 30 minutes including physician time.  Jolene Provost K PA 06/24/2015, 10:41 AM    Attending Note:   The patient was seen and examined.  Agree with assessment and plan as noted above.  Changes made to the above note as needed.  See progress note from day of discharge  Alvia Grove., MD, [facility] 06/25/2015, 4:51 PM [address],   [address] Office [phone] Pager [phone]

## 2015-06-28 ENCOUNTER — Encounter: Payer: Self-pay | Admitting: Cardiovascular Disease

## 2015-06-29 ENCOUNTER — Encounter: Payer: Self-pay | Admitting: Cardiovascular Disease

## 2015-06-30 ENCOUNTER — Telehealth: Payer: Self-pay

## 2015-06-30 NOTE — Telephone Encounter (Signed)
Prior auth for Eliquis 2.5mg sent to [facility]. 

## 2015-07-01 ENCOUNTER — Telehealth: Payer: Self-pay

## 2015-07-01 NOTE — Telephone Encounter (Signed)
Eliquis 2.5mg  approved through 04/10/2016. [id number].

## 2015-07-02 ENCOUNTER — Encounter: Payer: Self-pay | Admitting: Cardiovascular Disease

## 2015-07-02 NOTE — Telephone Encounter (Signed)
Called patient back about her MyChart message. Patient complaining about pain in lower right abdomen, where she had her hematoma. Patient stated it was hard, but now has soften up. Patient states the pain comes and goes, but today the pain has improved.  Patient stated the spot becomes sore when she rubs the spot. Patient denies blood in her urine. Blood pressure and heart rate are stable at BP 125/64 HR 83. Patient has an appointment on Tuesday with Boyce Medici PA. Encouraged patient to call office if she has changes in her BP, HR, blood in her urine, or pain gets worse. Patient verbalized understanding.

## 2015-07-07 ENCOUNTER — Encounter: Payer: Self-pay | Admitting: Cardiology

## 2015-07-07 ENCOUNTER — Ambulatory Visit (INDEPENDENT_AMBULATORY_CARE_PROVIDER_SITE_OTHER): Payer: Medicare Other | Admitting: Cardiology

## 2015-07-07 VITALS — BP 108/70 | HR 79 | Ht 65.0 in | Wt 173.8 lb

## 2015-07-07 DIAGNOSIS — R5383 Other fatigue: Secondary | ICD-10-CM

## 2015-07-07 DIAGNOSIS — I447 Left bundle-branch block, unspecified: Secondary | ICD-10-CM | POA: Diagnosis not present

## 2015-07-07 DIAGNOSIS — R600 Localized edema: Secondary | ICD-10-CM | POA: Diagnosis not present

## 2015-07-07 DIAGNOSIS — I5022 Chronic systolic (congestive) heart failure: Secondary | ICD-10-CM

## 2015-07-07 DIAGNOSIS — I4891 Unspecified atrial fibrillation: Secondary | ICD-10-CM | POA: Diagnosis not present

## 2015-07-07 NOTE — Patient Instructions (Addendum)
Medication Instructions:  Your physician recommends that you continue on your current medications as directed. Please refer to the Current Medication list given to you today.   Labwork: TODAY:  CBC, BMET, & ANEMIA PANEL  Testing/Procedures: None ordered  Follow-Up: Your physician recommends that you schedule a follow-up appointment in:  4 WEEKS WITH [name]    Any Other Special Instructions Will Be Listed Below (If Applicable).     If you need a refill on your cardiac medications before your next appointment, please call your pharmacy.

## 2015-07-07 NOTE — Progress Notes (Signed)
07/07/2015 [name]   1930-11-14  [id number]  Primary Physician Crawford Givens, MD Primary Cardiologist: [name]   Reason for Visit/CC: [name] F/u for Atrial Fibrillation   HPI:  82 y.o. female with a hx of PAF, NICM, systolic HF, LBBB, prior spontaneous retroperitoneal bleed on Coumadin, admtted 06/18/15 through the ED with weakness and dizziness and found to have AF with RVR. She was admitted for rate control and set up for TEE CV 06/19/15. She had TEE CV 06/19/15 then had a right rectus sheath hematoma while on Heparin and anticoagulation was stopped. She was seen in consult by Dr Luisa Hart who did not feel surgery was indicated. She did not require transfusion. She then reverted back to AF. After discussion with Dr Eden Emms and Dr Elease Hashimoto, the plan will be for low dose Eliquis and rate control.   She presents to clinic for f/u. She remains in Afib on EKG but rate is controlled in the 70s. She reports full compliance with her rate control agents and with Eliquis. She denies any abnormal bleeding. Her main complaint is constant fatigue. No dyspnea, palpitations, chest pain, dizziness, syncope/ near syncope. Her Hgb when she was discharged was 11.    Current Outpatient Prescriptions  Medication Sig Dispense Refill  . acetaminophen (TYLENOL) 650 MG CR tablet Take 650 mg by mouth every 8 (eight) hours as needed for pain.    [name] apixaban (ELIQUIS) 2.5 MG TABS tablet Take 1 tablet (2.5 mg total) by mouth 2 (two) times daily. 60 tablet 11  . digoxin (LANOXIN) 0.125 MG tablet Take 1 tablet (0.125 mg total) by mouth daily. 30 tablet 11  . diltiazem (CARDIZEM CD) 180 MG 24 hr capsule Take 1 capsule (180 mg total) by mouth daily. 30 capsule 11  . feeding supplement, ENSURE ENLIVE, (ENSURE ENLIVE) LIQD Take 237 mLs by mouth 2 (two) times daily between meals. 237 mL 12  . metoprolol succinate (TOPROL-XL) 50 MG 24 hr tablet Take 1 tablet (50 mg total) by mouth daily. Take with or immediately  following a meal. 30 tablet 11  . ondansetron (ZOFRAN-ODT) 4 MG disintegrating tablet Take 4 mg by mouth every 8 (eight) hours as needed for nausea or vomiting.    . pantoprazole (PROTONIX) 40 MG tablet Take 1 tablet (40 mg total) by mouth daily. 30 tablet 11  . ranitidine (ZANTAC) 150 MG tablet Take 75 mg by mouth 2 (two) times daily as needed for heartburn.    . torsemide (DEMADEX) 20 MG tablet Take 0.5 tablets (10 mg total) by mouth daily as needed (weight gain 3 lbs or more, edema). Reported on 06/18/2015     No current facility-administered medications for this visit.    Allergies  Allergen Reactions  . Ace Inhibitors     REACTION: cough  . Codeine Other (See Comments)    sedation  . Delsym [Dextromethorphan Polistirex Er] Other (See Comments)    dizziness  . Sulfamethoxazole-Trimethoprim     REACTION: GI intolerance.  . Warfarin Sodium Other (See Comments)    "bleed out"    Social History   Social History  . Marital Status: Married    Spouse Name: N/A  . Number of Children: 2  . Years of Education: N/A   Occupational History  . Retired    Social History Main Topics  . Smoking status: Never Smoker   . Smokeless tobacco: Never Used  . Alcohol Use: 0.0 oz/week    0 Standard drinks or equivalent per week  Comment: 06/18/2015 "beer or glass of wine/year"  . Drug Use: No  . Sexual Activity: No   Other Topics Concern  . Not on file   Social History Narrative   Retired, former [employer]   Married, [phone]   Lives with husband   2 grown children, one in [place], one out of state   Tobacco Use - No.    Alcohol Use - yes occ beer or wine   Drug Use - no   teaches Sunday school, reading     Review of Systems: General: negative for chills, fever, night sweats or weight changes.  Cardiovascular: negative for chest pain, dyspnea on exertion, edema, orthopnea, palpitations, paroxysmal nocturnal dyspnea or shortness of breath Dermatological: negative for  rash Respiratory: negative for cough or wheezing Urologic: negative for hematuria Abdominal: negative for nausea, vomiting, diarrhea, bright red blood per rectum, melena, or hematemesis Neurologic: negative for visual changes, syncope, or dizziness All other systems reviewed and are otherwise negative except as noted above.    Blood pressure 108/70, pulse 79, height 5\' 5"  (1.651 m), weight 173 lb 12.8 oz (78.835 kg), SpO2 95 %.  General appearance: alert, cooperative and no distress Neck: no carotid bruit and no JVD Lungs: clear to auscultation bilaterally Heart: irregularly irregular rhythm Extremities: no LEE Pulses: 2+ and symmetric Skin: warm and dry Neurologic: Grossly normal  EKG atrial fibrillation. 78 bpm  ASSESSMENT AND PLAN:   EKG shows afib with CVR. HR in the 70s. BP is stable. Per hospital notes, plan is for rate control strategy for now. She is w/o palpiations, dizziness, dyspnea, CP and syncope/ near syncope. Her only complaint is fatigue. ? If this is secondary to her afib vs. Anemia. Patient wishes to continue rate control startegy for a bit longer to see if fatigue improves with time. If no improvement, ? The possibility of repeat DCCV vs antiarrrhytmic medications. For now continue Metoprolol, Cardizem and digoxin. Continue Eliquis for a/c. We will also repeat a CBC to assess H/H given recent bleed, anemia and fatigue. We will also check an anemia panel. She may need supplemental Fe if iron deficiency. Will also check B12 and folate.     PLAN  F/u with [name] in 4 weeks. Patient advised to f/u sooner if her fatigue worsens as we may need to change approach to afib management.   Robbie Lis PA-C 07/07/2015 2:49 PM

## 2015-07-10 ENCOUNTER — Encounter: Payer: Self-pay | Admitting: Cardiology

## 2015-07-10 ENCOUNTER — Encounter: Payer: Self-pay | Admitting: Family Medicine

## 2015-07-10 ENCOUNTER — Ambulatory Visit (INDEPENDENT_AMBULATORY_CARE_PROVIDER_SITE_OTHER): Payer: Medicare Other | Admitting: Family Medicine

## 2015-07-10 VITALS — BP 112/76 | HR 109 | Temp 97.5°F | Wt 173.8 lb

## 2015-07-10 DIAGNOSIS — R350 Frequency of micturition: Secondary | ICD-10-CM | POA: Diagnosis not present

## 2015-07-10 DIAGNOSIS — I4891 Unspecified atrial fibrillation: Secondary | ICD-10-CM

## 2015-07-10 DIAGNOSIS — R5383 Other fatigue: Secondary | ICD-10-CM | POA: Diagnosis not present

## 2015-07-10 DIAGNOSIS — N3090 Cystitis, unspecified without hematuria: Secondary | ICD-10-CM

## 2015-07-10 DIAGNOSIS — I5022 Chronic systolic (congestive) heart failure: Secondary | ICD-10-CM

## 2015-07-10 LAB — BASIC METABOLIC PANEL
BUN: 14 mg/dL (ref 7–25)
CO2: 25 mmol/L (ref 20–31)
Calcium: 9.2 mg/dL (ref 8.6–10.4)
Chloride: 102 mmol/L (ref 98–110)
Creat: 0.71 mg/dL (ref 0.60–0.88)
Glucose, Bld: 97 mg/dL (ref 65–99)
Potassium: 4.5 mmol/L (ref 3.5–5.3)
Sodium: 140 mmol/L (ref 135–146)

## 2015-07-10 LAB — CBC
HCT: 39.1 % (ref 36.0–46.0)
Hemoglobin: 12.7 g/dL (ref 12.0–15.0)
MCH: 30.1 pg (ref 26.0–34.0)
MCHC: 32.5 g/dL (ref 30.0–36.0)
MCV: 92.7 fL (ref 78.0–100.0)
MPV: 10.3 fL (ref 8.6–12.4)
Platelets: 362 10*3/uL (ref 150–400)
RBC: 4.22 MIL/uL (ref 3.87–5.11)
RDW: 14.3 % (ref 11.5–15.5)
WBC: 7.1 10*3/uL (ref 4.0–10.5)

## 2015-07-10 LAB — IRON AND TIBC
%SAT: 21 % (ref 11–50)
Iron: 61 ug/dL (ref 45–160)
TIBC: 293 ug/dL (ref 250–450)
UIBC: 232 ug/dL (ref 125–400)

## 2015-07-10 LAB — FOLATE: Folate: 23.7 ng/mL (ref >5.4)

## 2015-07-10 LAB — POC URINALSYSI DIPSTICK (AUTOMATED)
Bilirubin, UA: NEGATIVE
Glucose, UA: NEGATIVE
Ketones, UA: NEGATIVE
Leukocytes, UA: NEGATIVE
Nitrite, UA: POSITIVE
Protein, UA: NEGATIVE
Spec Grav, UA: >=1.03
Urobilinogen, UA: 4
pH, UA: 6

## 2015-07-10 LAB — VITAMIN B12: Vitamin B-12: 625 pg/mL (ref 200–1100)

## 2015-07-10 LAB — FERRITIN: Ferritin: 109 ng/mL (ref 20–288)

## 2015-07-10 MED ORDER — CEPHALEXIN 500 MG PO CAPS: 500.0000 mg | ORAL_CAPSULE | Freq: Two times a day (BID) | ORAL | Status: DC

## 2015-07-10 NOTE — Progress Notes (Signed)
Pre visit review using our clinic review tool, if applicable. No additional management support is needed unless otherwise documented below in the visit note.  No burning with urination but frequency.  Some tenderness over the lower abd, suprapubic area.  No fevers, minimal nausea.  No abd pain like prev with the abd wall hematoma- that is resolved.   AF hx noted.  Due for f/u labs for anemia.   Fatigue noted.    Meds, vitals, and allergies reviewed.   ROS: See HPI.  Otherwise, noncontributory.  nad ncat Mmm Neck supple, no LA IRR, not tachy on recheck, pulse variable but in the 90s by MD recheck  ctab abd soft, minimally ttp in the lower abd at the suprapubic area No cva pain No BLE edema.

## 2015-07-10 NOTE — Assessment & Plan Note (Signed)
Likely, keflex, ucx, fluids, f/u prn.  D/w pt . She agrees.

## 2015-07-10 NOTE — Patient Instructions (Signed)
Drink plenty of water and start the antibiotics today.  We'll contact you with your lab report.  Take care.   

## 2015-07-13 LAB — URINE CULTURE: Colony Count: >=100000

## 2015-07-14 ENCOUNTER — Other Ambulatory Visit: Payer: Medicare Other

## 2015-07-21 ENCOUNTER — Encounter: Payer: Self-pay | Admitting: Family Medicine

## 2015-07-21 MED ORDER — CIPROFLOXACIN HCL 250 MG PO TABS: 250.0000 mg | ORAL_TABLET | Freq: Two times a day (BID) | ORAL | Status: DC

## 2015-07-21 NOTE — Telephone Encounter (Signed)
Will send in cipro 7d course.

## 2015-07-21 NOTE — Telephone Encounter (Signed)
There was a question as to whether or not her ABX needed to be changed after her culture came in.  Please advise.

## 2015-07-26 ENCOUNTER — Encounter: Payer: Self-pay | Admitting: Family Medicine

## 2015-07-29 ENCOUNTER — Other Ambulatory Visit: Payer: Self-pay | Admitting: Family Medicine

## 2015-07-29 MED ORDER — SERTRALINE HCL 50 MG PO TABS: 50.0000 mg | ORAL_TABLET | Freq: Every day | ORAL | Status: DC

## 2015-07-31 ENCOUNTER — Encounter: Payer: Self-pay | Admitting: Cardiovascular Disease

## 2015-08-09 ENCOUNTER — Encounter: Payer: Self-pay | Admitting: Cardiovascular Disease

## 2015-08-11 ENCOUNTER — Encounter: Payer: Self-pay | Admitting: Cardiovascular Disease

## 2015-08-11 ENCOUNTER — Ambulatory Visit (INDEPENDENT_AMBULATORY_CARE_PROVIDER_SITE_OTHER): Payer: Medicare Other | Admitting: Cardiovascular Disease

## 2015-08-11 VITALS — BP 112/60 | HR 58 | Ht 65.0 in | Wt 171.1 lb

## 2015-08-11 DIAGNOSIS — Z09 Encounter for follow-up examination after completed treatment for conditions other than malignant neoplasm: Secondary | ICD-10-CM | POA: Diagnosis not present

## 2015-08-11 NOTE — Patient Instructions (Addendum)
Medication Instructions:  Your physician has recommended you make the following change in your medication:  1-Stop digoxin 2-Stop Metoprolol  Labwork: NONE  Testing/Procedures: NONE  Follow-Up: Your physician wants you to follow-up in: 6 months with [name]. You will receive a reminder letter in the mail two months in advance. If you don't receive a letter, please call our office to schedule the follow-up appointment.  If you need a refill on your cardiac medications before your next appointment, please call your pharmacy.

## 2015-08-11 NOTE — Progress Notes (Signed)
Patient ID: [name], female   DOB: [birth year], 82 y.o.   MRN: [id number]   08/11/2015 [name]   11/29/1930  [id number]  Primary Physician Crawford Givens, MD Primary Cardiologist: [name]   Reason for Visit/CC: [facility] F/u for Atrial Fibrillation   HPI:  82 y.o. female with a hx of PAF, NICM, systolic HF, LBBB, prior spontaneous retroperitoneal bleed on Coumadin, admtted 06/18/15 through the ED with weakness and dizziness and found to have AF with RVR. She was admitted for rate control and set up for TEE CV 06/19/15. She had TEE CV 06/19/15 then had a right rectus sheath hematoma while on Heparin and anticoagulation was stopped. She was seen in consult by Dr Luisa Hart who did not feel surgery was indicated. She did not require transfusion. She then reverted back to AF. After discussion  the plan will be for low dose Eliquis and rate control.   Seen by PA March Afib on EKG but rate is controlled in the 70s. She reports full compliance with her rate control agents and with Eliquis. She denies any abnormal bleeding. Her main complaint is constant fatigue. No dyspnea, palpitations, chest pain, dizziness, syncope/ near syncope. Her Hgb when she was discharged was 11.   06/19/15 Study Conclusions  - Left ventricle: The cavity size was normal. Wall thickness was  normal. The estimated ejection fraction was 50%. Diffuse  hypokinesis. - Aortic valve: There was no stenosis. There was trivial  regurgitation. - Aorta: Normal caliber thoracic aorta with minimal plaque. - Mitral valve: There was trivial regurgitation. - Left atrium: The atrium was moderately to severely dilated. No  evidence of thrombus in the atrial cavity or appendage. - Right ventricle: The cavity size was normal. Systolic function  was normal. - Right atrium: The atrium was moderately to severely dilated. - Atrial septum: No defect or patent foramen ovale was identified. - Tricuspid valve: There was moderate  regurgitation. Peak RV-RA  gradient (S): 24 mm Hg.  Lab Results  Component Value Date   HCT 39.1 07/10/2015    After talking with her she seems to indicate depression more related to fatigue than any physical issue Has had clinical depression with suicidal ideation in past and has seen counselor. Started on Zoloft A few weeks ago with some benefit   Current Outpatient Prescriptions  Medication Sig Dispense Refill  . acetaminophen (TYLENOL) 650 MG CR tablet Take 650 mg by mouth every 8 (eight) hours as needed for pain.    [name] apixaban (ELIQUIS) 2.5 MG TABS tablet Take 1 tablet (2.5 mg total) by mouth 2 (two) times daily. 60 tablet 11  . cephALEXin (KEFLEX) 500 MG capsule Take 1 capsule (500 mg total) by mouth 2 (two) times daily. 14 capsule 0  . ciprofloxacin (CIPRO) 250 MG tablet Take 1 tablet (250 mg total) by mouth 2 (two) times daily. 14 tablet 0  . diltiazem (CARDIZEM CD) 180 MG 24 hr capsule Take 1 capsule (180 mg total) by mouth daily. 30 capsule 11  . feeding supplement, ENSURE ENLIVE, (ENSURE ENLIVE) LIQD Take 237 mLs by mouth 2 (two) times daily between meals. 237 mL 12  . ondansetron (ZOFRAN-ODT) 4 MG disintegrating tablet Take 4 mg by mouth every 8 (eight) hours as needed for nausea or vomiting.    . pantoprazole (PROTONIX) 40 MG tablet Take 1 tablet (40 mg total) by mouth daily. 30 tablet 11  . ranitidine (ZANTAC) 150 MG tablet Take 75 mg by mouth 2 (two) times  daily as needed for heartburn.    . sertraline (ZOLOFT) 50 MG tablet Take 1 tablet (50 mg total) by mouth daily.    [name] torsemide (DEMADEX) 20 MG tablet Take 0.5 tablets (10 mg total) by mouth daily as needed (weight gain 3 lbs or more, edema). Reported on 06/18/2015     No current facility-administered medications for this visit.    Allergies  Allergen Reactions  . Ace Inhibitors     REACTION: cough  . Codeine Other (See Comments)    sedation  . Delsym [Dextromethorphan Polistirex Er] Other (See Comments)     dizziness  . Sulfamethoxazole-Trimethoprim     REACTION: GI intolerance.  . Warfarin Sodium Other (See Comments)    "bleed out"    Social History   Social History  . Marital Status: Married    Spouse Name: N/A  . Number of Children: 2  . Years of Education: N/A   Occupational History  . Retired    Social History Main Topics  . Smoking status: Never Smoker   . Smokeless tobacco: Never Used  . Alcohol Use: 0.0 oz/week    0 Standard drinks or equivalent per week     Comment: 06/18/2015 "beer or glass of wine/year"  . Drug Use: No  . Sexual Activity: No   Other Topics Concern  . Not on file   Social History Narrative   Retired, former [employer]   Married, [phone]   Lives with husband   2 grown children, one in [place], one out of state   Tobacco Use - No.    Alcohol Use - yes occ beer or wine   Drug Use - no   teaches Sunday school, reading     Review of Systems: General: negative for chills, fever, night sweats or weight changes.  Cardiovascular: negative for chest pain, dyspnea on exertion, edema, orthopnea, palpitations, paroxysmal nocturnal dyspnea or shortness of breath Dermatological: negative for rash Respiratory: negative for cough or wheezing Urologic: negative for hematuria Abdominal: negative for nausea, vomiting, diarrhea, bright red blood per rectum, melena, or hematemesis Neurologic: negative for visual changes, syncope, or dizziness All other systems reviewed and are otherwise negative except as noted above.    Blood pressure 112/60, pulse 58, height  (1.651 m), weight 77.62 kg (171 lb 1.9 oz), SpO2 94 %.  General appearance: alert, cooperative and no distress Neck: no carotid bruit and no JVD Lungs: clear to auscultation bilaterally Heart: irregularly irregular rhythm Extremities: no LEE Pulses: 2+ and symmetric Skin: warm and dry Neurologic: Grossly normal  EKG  07/07/15   atrial fibrillation. 78 bpm  ASSESSMENT AND PLAN:   Afib:  try to minimize meds stop digoxen and toprol Don't think it is related to fatigue  Anemia  Last Hct 39  Stable  Anticoagulation on low dose eliquis given age, low body weight and rectus sheath hematoma LBBB stable no high grade heart block ECG q 6 months  Depression: I think this is the main issue regarding fatigue.  On Zoloft f/u primary    [employer]

## 2015-08-17 ENCOUNTER — Telehealth: Payer: Self-pay | Admitting: Family Medicine

## 2015-08-17 ENCOUNTER — Encounter: Payer: Self-pay | Admitting: Cardiovascular Disease

## 2015-08-17 NOTE — Telephone Encounter (Signed)
-----   Message from Annamarie Major, [place] sent at 08/17/2015  2:58 PM EDT ----- Patient says she is feeling much better.  She had an OV with [name] last week who took her off of Metoprolol and another med that had been making her feel nauseous and she is now feeling much better. ----- Message -----    From: [name], MD    Sent: 08/17/2015      To: Annamarie Major, CMA  Please check on patient re: mood.  She was restarted on SSRI in April.  Thanks.

## 2015-08-17 NOTE — Telephone Encounter (Signed)
Noted. Thanks.

## 2015-08-21 ENCOUNTER — Ambulatory Visit: Payer: Medicare Other | Admitting: Cardiovascular Disease

## 2015-08-25 ENCOUNTER — Encounter: Payer: Self-pay | Admitting: Cardiovascular Disease

## 2015-09-11 ENCOUNTER — Encounter: Payer: Self-pay | Admitting: Family Medicine

## 2015-09-11 ENCOUNTER — Other Ambulatory Visit: Payer: Self-pay

## 2015-09-11 ENCOUNTER — Encounter: Payer: Self-pay | Admitting: Cardiovascular Disease

## 2015-09-11 MED ORDER — METOPROLOL SUCCINATE ER 25 MG PO TB24: 25.0000 mg | ORAL_TABLET | Freq: Every day | ORAL | Status: DC

## 2015-09-11 MED ORDER — DILTIAZEM HCL ER COATED BEADS 120 MG PO CP24: 120.0000 mg | ORAL_CAPSULE | Freq: Every day | ORAL | Status: DC

## 2015-09-18 ENCOUNTER — Encounter: Payer: Self-pay | Admitting: Cardiovascular Disease

## 2015-09-18 ENCOUNTER — Other Ambulatory Visit: Payer: Self-pay

## 2015-10-13 ENCOUNTER — Other Ambulatory Visit: Payer: Self-pay | Admitting: Cardiovascular Disease

## 2015-10-16 ENCOUNTER — Telehealth: Payer: Self-pay | Admitting: Family Medicine

## 2015-10-16 ENCOUNTER — Ambulatory Visit: Payer: Medicare Other | Admitting: Primary Care

## 2015-10-16 NOTE — Telephone Encounter (Signed)
Patient Name: [name] DOB: 07-20-1930 Initial Comment Caller states she is tired, has lack of energy, bp is low. Last bp 96/60. Nurse Assessment Nurse: Catherine Elizabeth, RN, [name] Date/Time (Eastern Time): 10/16/2015 11:23:37 AM Confirm and document reason for call. If symptomatic, describe symptoms. You must click the next button to save text entered. ---Caller states that her blood pressure was 96/60 yesterday and she developed dizziness with fatigue about 4 months that is worse today. No severe breathing difficulty. No injury in the past 3 days. Alert and responsive. Has the patient traveled out of the country within the last 30 days? ---No Does the patient have any new or worsening symptoms? ---Yes Will a triage be completed? ---Yes Related visit to physician within the last 2 weeks? ---No Does the PT have any chronic conditions? (i.e. diabetes, asthma, etc.) ---Yes List chronic conditions. ---A-Fib, Depression Is this a behavioral health or substance abuse call? ---No Guidelines Guideline Title Affirmed Question Affirmed Notes Low Blood Pressure [1] Systolic BP 90-110 AND [2] taking blood pressure medications AND [3] dizzy, lightheaded or weak Final Disposition User See Physician within 4 Hours (or PCP triage) Catherine Elizabeth, RN, [name] Comments No appointment available at [facility] or [facility]. [name] declined to be seen at another office or urgent care. Called the office backline and [name] will notify [name]. [name] states she does not like answering the triage questions and that she just wants to schedule an appointment with her MD next week. She will recheck her blood pressure to see what it is today. She is aware that someone from the office will call her back. Referrals GO TO FACILITY REFUSED Disagree/Comply: Disagree Disagree/Comply Reason: Disagree with instructions

## 2015-10-16 NOTE — Telephone Encounter (Signed)
I spoke with pt and her BP is lower than usual and pt has dizziness on and off.offered appt with Mayra Reel NP today at 2 pm. Pt said her BP is better now and she cannot come in today for appt. Pt will cb if appt needed.

## 2015-10-16 NOTE — Telephone Encounter (Signed)
Noted. Thanks.  I'll defer to patient.  

## 2015-11-02 ENCOUNTER — Encounter: Payer: Self-pay | Admitting: Cardiovascular Disease

## 2015-12-27 ENCOUNTER — Other Ambulatory Visit: Payer: Self-pay | Admitting: Family Medicine

## 2015-12-27 DIAGNOSIS — I420 Dilated cardiomyopathy: Secondary | ICD-10-CM

## 2015-12-27 DIAGNOSIS — M810 Age-related osteoporosis without current pathological fracture: Secondary | ICD-10-CM

## 2015-12-30 ENCOUNTER — Ambulatory Visit (INDEPENDENT_AMBULATORY_CARE_PROVIDER_SITE_OTHER): Payer: Medicare Other

## 2015-12-30 ENCOUNTER — Other Ambulatory Visit (INDEPENDENT_AMBULATORY_CARE_PROVIDER_SITE_OTHER): Payer: Medicare Other

## 2015-12-30 VITALS — BP 102/78 | HR 81 | Temp 97.9°F | Ht 65.0 in | Wt 165.5 lb

## 2015-12-30 DIAGNOSIS — Z23 Encounter for immunization: Secondary | ICD-10-CM

## 2015-12-30 DIAGNOSIS — M810 Age-related osteoporosis without current pathological fracture: Secondary | ICD-10-CM

## 2015-12-30 DIAGNOSIS — I420 Dilated cardiomyopathy: Secondary | ICD-10-CM

## 2015-12-30 DIAGNOSIS — Z0000 Encounter for general adult medical examination without abnormal findings: Secondary | ICD-10-CM | POA: Diagnosis not present

## 2015-12-30 LAB — LIPID PANEL
Cholesterol: 202 mg/dL — ABNORMAL HIGH (ref 0–200)
HDL: 48 mg/dL (ref >39.00)
LDL Cholesterol: 116 mg/dL — ABNORMAL HIGH (ref 0–99)
NonHDL: 154.17
Total CHOL/HDL Ratio: 4
Triglycerides: 193 mg/dL — ABNORMAL HIGH (ref 0.0–149.0)
VLDL: 38.6 mg/dL (ref 0.0–40.0)

## 2015-12-30 LAB — COMPREHENSIVE METABOLIC PANEL
ALT: 10 U/L (ref 0–35)
AST: 17 U/L (ref 0–37)
Albumin: 3.7 g/dL (ref 3.5–5.2)
Alkaline Phosphatase: 94 U/L (ref 39–117)
BUN: 21 mg/dL (ref 6–23)
CO2: 32 mEq/L (ref 19–32)
Calcium: 9.2 mg/dL (ref 8.4–10.5)
Chloride: 101 mEq/L (ref 96–112)
Creatinine, Ser: 0.91 mg/dL (ref 0.40–1.20)
GFR: 62.77 mL/min (ref >60.00)
Glucose, Bld: 92 mg/dL (ref 70–99)
Potassium: 3.9 mEq/L (ref 3.5–5.1)
Sodium: 140 mEq/L (ref 135–145)
Total Bilirubin: 0.4 mg/dL (ref 0.2–1.2)
Total Protein: 6.8 g/dL (ref 6.0–8.3)

## 2015-12-30 LAB — VITAMIN D 25 HYDROXY (VIT D DEFICIENCY, FRACTURES): VITD: 33.29 ng/mL (ref 30.00–100.00)

## 2015-12-30 LAB — CBC WITH DIFFERENTIAL/PLATELET
Basophils Absolute: 0 10*3/uL (ref 0.0–0.1)
Basophils Relative: 0.5 % (ref 0.0–3.0)
Eosinophils Absolute: 0.1 10*3/uL (ref 0.0–0.7)
Eosinophils Relative: 1.5 % (ref 0.0–5.0)
HCT: 39.7 % (ref 36.0–46.0)
Hemoglobin: 13.4 g/dL (ref 12.0–15.0)
Lymphocytes Relative: 29 % (ref 12.0–46.0)
Lymphs Abs: 2.4 10*3/uL (ref 0.7–4.0)
MCHC: 33.8 g/dL (ref 30.0–36.0)
MCV: 90.1 fl (ref 78.0–100.0)
Monocytes Absolute: 0.5 10*3/uL (ref 0.1–1.0)
Monocytes Relative: 6.6 % (ref 3.0–12.0)
Neutro Abs: 5.1 10*3/uL (ref 1.4–7.7)
Neutrophils Relative %: 62.4 % (ref 43.0–77.0)
Platelets: 238 10*3/uL (ref 150.0–400.0)
RBC: 4.4 Mil/uL (ref 3.87–5.11)
RDW: 13.7 % (ref 11.5–15.5)
WBC: 8.2 10*3/uL (ref 4.0–10.5)

## 2015-12-30 NOTE — Progress Notes (Signed)
PCP notes:   Health maintenance:   Flu vaccine - administered  Abnormal screenings:   Hearing  Failed Fall risk - hx of fall without injury  Patient concerns:   Pt reports intermittent episodes of dizziness which she attributed to her medication.   Nurse concerns:  None  Next PCP appt:   01/07/16 @ 0945

## 2015-12-30 NOTE — Patient Instructions (Signed)
[name] , Thank you for taking time to come for your Medicare Wellness Visit. I appreciate your ongoing commitment to your health goals. Please review the following plan we discussed and let me know if I can assist you in the future.   These are the goals we discussed: Goals    . Increase water intake          Starting 12/30/2015, I will attempt to drink at least one glass of water with each meal.        This is a list of the screening recommended for you and due dates:  Health Maintenance  Topic Date Due  . DEXA scan (bone density measurement)  12/29/2025*  . Tetanus Vaccine  05/13/2020  . Flu Shot  Completed  . Shingles Vaccine  Completed  . Pneumonia vaccines  Completed  *Topic was postponed. The date shown is not the original due date.   Preventive Care for Adults  A healthy lifestyle and preventive care can promote health and wellness. Preventive health guidelines for adults include the following key practices.  . A routine yearly physical is a good way to check with your health care provider about your health and preventive screening. It is a chance to share any concerns and updates on your health and to receive a thorough exam.  . Visit your dentist for a routine exam and preventive care every 6 months. Brush your teeth twice a day and floss once a day. Good oral hygiene prevents tooth decay and gum disease.  . The frequency of eye exams is based on your age, health, family medical history, use  of contact lenses, and other factors. Follow your health care provider's ecommendations for frequency of eye exams.  . Eat a healthy diet. Foods like vegetables, fruits, whole grains, low-fat dairy products, and lean protein foods contain the nutrients you need without too many calories. Decrease your intake of foods high in solid fats, added sugars, and salt. Eat the right amount of calories for you. Get information about a proper diet from your health care provider, if  necessary.  . Regular physical exercise is one of the most important things you can do for your health. Most adults should get at least 150 minutes of moderate-intensity exercise (any activity that increases your heart rate and causes you to sweat) each week. In addition, most adults need muscle-strengthening exercises on 2 or more days a week.  Silver Sneakers may be a benefit available to you. To determine eligibility, you may visit the website: www.silversneakers.com or contact program at 743-679-3649 Mon-Fri between 8AM-8PM.   . Maintain a healthy weight. The body mass index (BMI) is a screening tool to identify possible weight problems. It provides an estimate of body fat based on height and weight. Your health care provider can find your BMI and can help you achieve or maintain a healthy weight.   For adults 20 years and older: ? A BMI below 18.5 is considered underweight. ? A BMI of 18.5 to 24.9 is normal. ? A BMI of 25 to 29.9 is considered overweight. ? A BMI of 30 and above is considered obese.   . Maintain normal blood lipids and cholesterol levels by exercising and minimizing your intake of saturated fat. Eat a balanced diet with plenty of fruit and vegetables. Blood tests for lipids and cholesterol should begin at age 47 and be repeated every 5 years. If your lipid or cholesterol levels are high, you are over 50, or you  are at high risk for heart disease, you may need your cholesterol levels checked more frequently. Ongoing high lipid and cholesterol levels should be treated with medicines if diet and exercise are not working.  . If you smoke, find out from your health care provider how to quit. If you do not use tobacco, please do not start.  . If you choose to drink alcohol, please do not consume more than 2 drinks per day. One drink is considered to be 12 ounces (355 mL) of beer, 5 ounces (148 mL) of wine, or 1.5 ounces (44 mL) of liquor.  . If you are 66-51 years old, ask your  health care provider if you should take aspirin to prevent strokes.  . Use sunscreen. Apply sunscreen liberally and repeatedly throughout the day. You should seek shade when your shadow is shorter than you. Protect yourself by wearing long sleeves, pants, a wide-brimmed hat, and sunglasses year round, whenever you are outdoors.  . Once a month, do a whole body skin exam, using a mirror to look at the skin on your back. Tell your health care provider of new moles, moles that have irregular borders, moles that are larger than a pencil eraser, or moles that have changed in shape or color.

## 2015-12-30 NOTE — Progress Notes (Signed)
Subjective:   [name] is a 82 y.o. female who presents for Medicare Annual (Subsequent) preventive examination.  Review of Systems:  N/A Cardiac Risk Factors include: advanced age (>5455men, 57>65 women)     Objective:     Vitals: BP 102/78 (BP Location: Left Arm, Patient Position: Sitting, Cuff Size: Normal)   Pulse 81   Temp 97.9 F (36.6 C) (Oral)   Ht 5\' 5"  (1.651 m)   Wt 165 lb 8 oz (75.1 kg)   SpO2 98%   BMI 27.54 kg/m   Body mass index is 27.54 kg/m.   Tobacco History  Smoking Status  . Never Smoker  Smokeless Tobacco  . Never Used     Counseling given: No   Past Medical History:  Diagnosis Date  . Arthritis    "knees" (06/18/2015)  . Atrial fibrillation (HCC)    h/o sig bleed on coumadin  . Atrial fibrillation with RVR (HCC) 06/18/2015  . CHF (congestive heart failure) (HCC)   . Complication of anesthesia 2010   "w/cardiac cath; got into a psychotic state for ~ 3 days; got me out w/Valium"  . Depression    "I have it off and on; not as often as I've gotten older" (06/18/2015)  . Diverticulosis    descending and sigmoid colon--Dr. Jarold MottoPatterson  . GERD (gastroesophageal reflux disease)   . Insomnia     off chronic ambien 5mg  as of 10/11, rare/episodic use since then  DCM/CHF  . Osteoporosis    on DXA 05/2010  . Sciatica    Right   Past Surgical History:  Procedure Laterality Date  . CARDIAC CATHETERIZATION  2010  . CARDIOVERSION N/A 06/19/2015   Procedure: CARDIOVERSION;  Surgeon: Laurey Moralealton S McLean, MD;  [facility];  Service: Cardiovascular;  Laterality: N/A;  . CATARACT EXTRACTION W/ INTRAOCULAR LENS  IMPLANT, BILATERAL Bilateral   . DILATION AND CURETTAGE OF UTERUS    . TEE WITHOUT CARDIOVERSION N/A 06/19/2015   Procedure: TRANSESOPHAGEAL ECHOCARDIOGRAM (TEE);  Surgeon: Laurey Moralealton S McLean, MD;  [facility];  Service: Cardiovascular;  Laterality: N/A;  . VAGINAL HYSTERECTOMY  1979   ovaries intact   Family History  Problem  Relation Age of Onset  . Cancer Father   . Colon cancer Father     possible colon cancer  . Tuberculosis Mother   . Breast cancer Neg Hx    History  Sexual Activity  . Sexual activity: No    Outpatient Encounter Prescriptions as of 12/30/2015  Medication Sig  . acetaminophen (TYLENOL) 650 MG CR tablet Take 650 mg by mouth every 8 (eight) hours as needed for pain.  [name]. apixaban (ELIQUIS) 2.5 MG TABS tablet Take 1 tablet (2.5 mg total) by mouth 2 (two) times daily.  . feeding supplement, ENSURE ENLIVE, (ENSURE ENLIVE) LIQD Take 237 mLs by mouth 2 (two) times daily between meals.  . metoprolol succinate (TOPROL XL) 25 MG 24 hr tablet Take 1 tablet (25 mg total) by mouth daily. (Patient taking differently: Take 12.5 mg by mouth daily. )  . ranitidine (ZANTAC) 150 MG tablet Take 75 mg by mouth 2 (two) times daily as needed for heartburn.  . sertraline (ZOLOFT) 50 MG tablet Take 1 tablet (50 mg total) by mouth daily.  [name]. torsemide (DEMADEX) 20 MG tablet TAKE ONE TABLET BY MOUTH TWICE DAILY AS NEEDED  . vitamin B-12 (CYANOCOBALAMIN) 1000 MCG tablet Take 1,000 mcg by mouth daily.  . ondansetron (ZOFRAN-ODT) 4 MG disintegrating tablet Take 4 mg  by mouth every 8 (eight) hours as needed for nausea or vomiting.  . [DISCONTINUED] cephALEXin (KEFLEX) 500 MG capsule Take 1 capsule (500 mg total) by mouth 2 (two) times daily.  . [DISCONTINUED] ciprofloxacin (CIPRO) 250 MG tablet Take 1 tablet (250 mg total) by mouth 2 (two) times daily.  . [DISCONTINUED] pantoprazole (PROTONIX) 40 MG tablet Take 1 tablet (40 mg total) by mouth daily.   No facility-administered encounter medications on file as of 12/30/2015.     Activities of Daily Living In your present state of health, do you have any difficulty performing the following activities: 12/30/2015 06/18/2015  Hearing? N -  Vision? N -  Difficulty concentrating or making decisions? N -  Walking or climbing stairs? Y -  Dressing or bathing? N -  Doing  errands, shopping? N N  Preparing Food and eating ? N -  Using the Toilet? N -  In the past six months, have you accidently leaked urine? N -  Do you have problems with loss of bowel control? N -  Managing your Medications? N -  Managing your Finances? N -  Housekeeping or managing your Housekeeping? N -  Some recent data might be hidden    Patient Care Team: [name], MD as PCP - General Mckinley Jewel, MD as Consulting Physician (Ophthalmology) Wendall Stade, MD as Consulting Physician (Cardiology) Breck Coons, DDS as Referring Physician (Dentistry) [name], MD as Consulting Physician (Orthopedic Surgery)    Assessment:     Exercise Activities and Dietary recommendations Current Exercise Habits: The patient does not participate in regular exercise at present, Exercise limited by: None identified  Goals    . Increase water intake          Starting 12/30/2015, I will attempt to drink at least one glass of water with each meal.       Fall Risk Fall Risk  12/30/2015 10/23/2014 08/26/2013 03/27/2012 03/27/2012  Falls in the past year? Yes No Yes No No  Number falls in past yr: 1 - 1 - -  Injury with Fall? No - - - -  Follow up Falls evaluation completed - - - -   Depression Screen PHQ 2/9 Scores 12/30/2015 10/23/2014 08/26/2013 03/27/2012  PHQ - 2 Score 0 0 2 4  PHQ- 9 Score - - - 5     Cognitive Testing MMSE - Mini Mental State Exam 12/30/2015  Orientation to time 5  Orientation to Place 5  Registration 3  Attention/ Calculation 0  Recall 3  Language- name 2 objects 0  Language- repeat 1  Language- follow 3 step command 3  Language- read & follow direction 0  Write a sentence 0  Copy design 0  Total score 20   PLEASE NOTE: A Mini-Cog screen was completed. Maximum score is 20. A value of 0 denotes this part of Folstein MMSE was not completed or the patient failed this part of the Mini-Cog screening.   Mini-Cog Screening Orientation to Time - Max 5  pts Orientation to Place - Max 5 pts Registration - Max 3 pts Recall - Max 3 pts Language Repeat - Max 1 pts Language Follow 3 Step Command - Max 3 pts  Immunization History  Administered Date(s) Administered  . Influenza Whole 12/10/2009, 01/01/2012  . Influenza,inj,Quad PF,36+ Mos 12/30/2013, 01/08/2015, 12/30/2015  . Pneumococcal Conjugate-13 10/23/2014  . Pneumococcal Polysaccharide-23 04/11/2006  . Td 05/13/2010  . Zoster 04/11/2006   Screening Tests Health Maintenance  Topic Date Due  .  DEXA SCAN  12/29/2025 (Originally 02/12/1998)  . TETANUS/TDAP  05/13/2020  . INFLUENZA VACCINE  Completed  . ZOSTAVAX  Completed  . PNA vac Low Risk Adult  Completed      Plan:   I have personally reviewed and addressed the Medicare Annual Wellness questionnaire and have noted the following in the patient's chart:  A. Medical and social history B. Use of alcohol, tobacco or illicit drugs  C. Current medications and supplements D. Functional ability and status E.  Nutritional status F.  Physical activity G. Advance directives H. List of other physicians I.  Hospitalizations, surgeries, and ER visits in previous 12 months J.  Vitals K. Screenings to include hearing, vision, cognitive, depression L. Referrals and appointments - none  In addition, I have reviewed and discussed with patient certain preventive protocols, quality metrics, and best practice recommendations. A written personalized care plan for preventive services as well as general preventive health recommendations were provided to patient.  See attached scanned questionnaire for additional information.   Signed,   Randa Evens, MHA, BS, LPN Health Advisor

## 2015-12-30 NOTE — Progress Notes (Signed)
Pre visit review using our clinic review tool, if applicable. No additional management support is needed unless otherwise documented below in the visit note. 

## 2015-12-31 NOTE — Progress Notes (Signed)
I reviewed health advisor's note, was available for consultation, and agree with documentation and plan.  

## 2016-01-01 ENCOUNTER — Ambulatory Visit: Payer: Medicare Other

## 2016-01-07 ENCOUNTER — Ambulatory Visit (INDEPENDENT_AMBULATORY_CARE_PROVIDER_SITE_OTHER): Payer: Medicare Other | Admitting: Family Medicine

## 2016-01-07 ENCOUNTER — Encounter: Payer: Self-pay | Admitting: Family Medicine

## 2016-01-07 VITALS — BP 104/60 | HR 105 | Temp 98.3°F | Ht 65.0 in | Wt 165.5 lb

## 2016-01-07 DIAGNOSIS — F32A Depression, unspecified: Secondary | ICD-10-CM

## 2016-01-07 DIAGNOSIS — F329 Major depressive disorder, single episode, unspecified: Secondary | ICD-10-CM | POA: Diagnosis not present

## 2016-01-07 DIAGNOSIS — M810 Age-related osteoporosis without current pathological fracture: Secondary | ICD-10-CM

## 2016-01-07 DIAGNOSIS — I480 Paroxysmal atrial fibrillation: Secondary | ICD-10-CM

## 2016-01-07 DIAGNOSIS — Z9181 History of falling: Secondary | ICD-10-CM

## 2016-01-07 DIAGNOSIS — Z7189 Other specified counseling: Secondary | ICD-10-CM

## 2016-01-07 MED ORDER — DILTIAZEM HCL ER COATED BEADS 120 MG PO CP24: 120.0000 mg | ORAL_CAPSULE | Freq: Every day | ORAL | Status: DC

## 2016-01-07 NOTE — Assessment & Plan Note (Addendum)
She has a low blood pressure episodically noted. She is back on diltiazem. Would continue this, but stop metoprolol. We will see if stopping her beta blocker improves her mood, her energy level, and her lightheadedness. She will update me. Continue anticoagulation. Okay for outpatient follow-up. >25 minutes spent in face to face time with patient, >50% spent in counselling or coordination of care

## 2016-01-07 NOTE — Progress Notes (Signed)
Pre visit review using our clinic review tool, if applicable. No additional management support is needed unless otherwise documented below in the visit note. 

## 2016-01-07 NOTE — Progress Notes (Signed)
Abnormal screenings:   Hearing failed- declined hearing aids.   Fall risk - hx of fall without injury- d/w pt about fall cautions.  She has gotten better shoes in the meantime.    Pt reports intermittent episodes of dizziness which she attributed to her medication.  Lower BP noted today.  Has occ been even lower on home check. No CP.  Med list updated.  She had trouble from diarrhea with lactose.  She didn't have diarrhea from diltiazem.  She is better on lactaid, w/o diarrhea.  Labs d/w pt.   Still anticoagulated.  HR usually ~90-100.    Mood is better with SSRI, but still her mood can be low, unclear if this is related to beta blocker use.  No ADE on med. Compliant.  No SI/HI.    Living will d/w pt. Husband designated if patient were incapacitated, then her son/daughter equally designated thereafter if needed.   Mammogram 2017.   Declined DXA.    PMH and SH reviewed  ROS: Per HPI unless specifically indicated in ROS section   Meds, vitals, and allergies reviewed.   GEN: nad, alert and oriented HEENT: mucous membranes moist NECK: supple w/o LA CV: IRR PULM: ctab, no inc wob ABD: soft, +bs EXT: no edema SKIN: no acute rash

## 2016-01-07 NOTE — Assessment & Plan Note (Signed)
Follow-up DEXA declined.

## 2016-01-07 NOTE — Assessment & Plan Note (Signed)
See above. It is likely that the SSRIs helping, but her mood has been episodically lower. Unclear if this is related to beta blocker use. Stop beta blocker for now. She will update me. Continue SSRI. Okay for outpatient follow-up.

## 2016-01-07 NOTE — Assessment & Plan Note (Signed)
Discussed with patient about home balance exercises. We can refer her to physical therapy if needed. See after visit summary.

## 2016-01-07 NOTE — Patient Instructions (Addendum)
Continue 120mg  diltiazem daily.  Stop metoprolol.  Update me in a few days.  If you have an unsteady gait after the med change, then let me know.  We can set you up with PT.   If you mood is getting worse then let me know.

## 2016-01-07 NOTE — Assessment & Plan Note (Signed)
If patient were incapacitated she would have her husband designated. Then she would have her son/daughter equally designated thereafter if needed. 

## 2016-01-11 ENCOUNTER — Telehealth: Payer: Self-pay | Admitting: Family Medicine

## 2016-01-11 ENCOUNTER — Encounter: Payer: Self-pay | Admitting: Family Medicine

## 2016-01-11 MED ORDER — METOPROLOL SUCCINATE ER 25 MG PO TB24: 12.5000 mg | ORAL_TABLET | Freq: Every day | ORAL | Status: DC

## 2016-01-11 NOTE — Telephone Encounter (Signed)
Please call pt.  See mychart message.   Have her recheck her BP.  Is she lightheaded? Please make sure she is drinking enough water/fluid in the meantime.   If her BP is high enough to tolerate 1/2 dose of metoprolol, then I would add that back to on help with her elevated pulse.   Let me know about her most recent BP in the meantime.  Thanks.

## 2016-01-11 NOTE — Telephone Encounter (Signed)
I would try the half dose of metoprolol to see if that will control her pulse w/o dropping her BP too much.  If she has to take a full tab for persistently elevated heart rates >>100, then that would be okay.   I'm going to route this to cardiology for input in the meantime.    [name]- patient is back on diltiazem.  We tried to stop her BB but she had elevated HR. She is back on BB now.   She has had relatively low BPs.  Is it reasonable to lower the dose of diltiazem in the meantime?  I would like your input to try to prevent hypotension.  Thanks.

## 2016-01-11 NOTE — Telephone Encounter (Signed)
To clarify, at the last OV on 01/07/16 she had lower BP, intermittent dizziness, and depressive symptoms.   It was unclear how much of that was attributed to the metoprolol.   I asked her to stop the metoprolol at that point and continue the diltiazem at 120 mg a day. She notified me today about a pulse of 124 and BP 88/57.  She subsequently restarted 25mg  of metoprolol today for her elevated HR.  Going forward, I asked her to take 12.5mg  of metoprolol daily if possible/tolerated, 25mg  if needed.   I remain concerned about her tendency for low BPs with high pulse.   Do you think the diltiazem is having an excessive effect on her BP? Should she have another agent for rate control? Thanks for your input.

## 2016-01-11 NOTE — Telephone Encounter (Signed)
Your note is confusing. Bottom part talks about halving dose of beta blocker and upper part halving cardizem Med list does not have beta blocker listed so I can't tell what dose she is on Cardizem dose is already very low Is she having any symptoms from her BP ??

## 2016-01-11 NOTE — Telephone Encounter (Signed)
Spoke with patient.  Her BP now is 109/67, P 103.   Taken a second time 110/66 P 92  No light-headedness or pain.  Patient was instructed to be sure to drink enough water/fluids.  Patient states she took a whole Metoprolol this morning because her pulse was so high at 124.  Please advise.

## 2016-01-12 ENCOUNTER — Encounter: Payer: Self-pay | Admitting: Cardiovascular Disease

## 2016-01-12 MED ORDER — METOPROLOL SUCCINATE ER 25 MG PO TB24: 25.0000 mg | ORAL_TABLET | Freq: Every day | ORAL | Status: DC

## 2016-01-12 NOTE — Telephone Encounter (Signed)
See below, note from [name].  Thanks.   Med list updated.

## 2016-01-12 NOTE — Telephone Encounter (Signed)
Would keep on 25 of Toprol in am and 120 cardizem at night

## 2016-01-12 NOTE — Telephone Encounter (Signed)
Left detailed message on voicemail.  

## 2016-01-22 NOTE — Progress Notes (Signed)
Patient ID: [name], female   DOB: 07-29-1930, 82 y.o.   MRN: [id number]   01/26/2016 [name]   Sep 16, 1930  [id number]  Primary Physician Crawford Givens, MD Primary Cardiologist: [name]   Reason for Visit/CC: [facility] F/u for Atrial Fibrillation   HPI:  82 y.o. female with a hx of PAF, NICM, systolic HF, LBBB, prior spontaneous retroperitoneal bleed on Coumadin, admtted 06/18/15 through the ED with weakness and dizziness and found to have AF with RVR. She was admitted for rate control and set up for TEE CV 06/19/15. She had TEE CV 06/19/15 then had a right rectus sheath hematoma while on Heparin and anticoagulation was stopped. She was seen in consult by Dr Luisa Hart who did not feel surgery was indicated. She did not require transfusion. She then reverted back to AF. After discussion  the plan will be for low dose Eliquis and rate control.   Seen by PA March Afib on EKG but rate is controlled in the 70s. She reports full compliance with her rate control agents and with Eliquis. She denies any abnormal bleeding. Her main complaint is constant fatigue. No dyspnea, palpitations, chest pain, dizziness, syncope/ near syncope. Her Hgb when she was discharged was 11.   06/19/15 Study Conclusions  - Left ventricle: The cavity size was normal. Wall thickness was  normal. The estimated ejection fraction was 50%. Diffuse  hypokinesis. - Aortic valve: There was no stenosis. There was trivial  regurgitation. - Aorta: Normal caliber thoracic aorta with minimal plaque. - Mitral valve: There was trivial regurgitation. - Left atrium: The atrium was moderately to severely dilated. No  evidence of thrombus in the atrial cavity or appendage. - Right ventricle: The cavity size was normal. Systolic function  was normal. - Right atrium: The atrium was moderately to severely dilated. - Atrial septum: No defect or patent foramen ovale was identified. - Tricuspid valve: There was  moderate regurgitation. Peak RV-RA  gradient (S): 24 mm Hg.  Lab Results  Component Value Date   HCT 39.7 12/30/2015    After talking with her she seems to indicate depression more related to fatigue than any physical issue Has had clinical depression with suicidal ideation in past and has seen counselor. Started on Zoloft  with some benefit  Digoxen and Toprol stopped 08/11/15 to see if fatigue improved   Current Outpatient Prescriptions  Medication Sig Dispense Refill  . acetaminophen (TYLENOL) 650 MG CR tablet Take 650 mg by mouth every 8 (eight) hours as needed for pain.    [name] apixaban (ELIQUIS) 2.5 MG TABS tablet Take 1 tablet (2.5 mg total) by mouth 2 (two) times daily. 60 tablet 11  . diltiazem (CARDIZEM CD) 120 MG 24 hr capsule Take 1 capsule (120 mg total) by mouth daily.    . feeding supplement, ENSURE ENLIVE, (ENSURE ENLIVE) LIQD Take 237 mLs by mouth 2 (two) times daily between meals. 237 mL 12  . metoprolol succinate (TOPROL XL) 25 MG 24 hr tablet Take 1 tablet (25 mg total) by mouth daily.    . ondansetron (ZOFRAN-ODT) 4 MG disintegrating tablet Take 4 mg by mouth every 8 (eight) hours as needed for nausea or vomiting.    . ranitidine (ZANTAC) 150 MG tablet Take 75 mg by mouth 2 (two) times daily as needed for heartburn.    . sertraline (ZOLOFT) 50 MG tablet Take 1 tablet (50 mg total) by mouth daily.    [name] torsemide (DEMADEX) 20 MG tablet TAKE ONE  TABLET BY MOUTH TWICE DAILY AS NEEDED 60 tablet 10  . vitamin B-12 (CYANOCOBALAMIN) 1000 MCG tablet Take 1,000 mcg by mouth daily.     No current facility-administered medications for this visit.     Allergies  Allergen Reactions  . Ace Inhibitors     REACTION: cough  . Codeine Other (See Comments)    sedation  . Delsym [Dextromethorphan Polistirex Er] Other (See Comments)    dizziness  . Lactose Intolerance (Gi)     diarrhea  . Sulfamethoxazole-Trimethoprim     REACTION: GI intolerance.  . Warfarin Sodium Other (See  Comments)    "bleed out"    Social History   Social History  . Marital status: Married    Spouse name: N/A  . Number of children: 2  . Years of education: N/A   Occupational History  . Retired Retired   Social History Main Topics  . Smoking status: Never Smoker  . Smokeless tobacco: Never Used  . Alcohol use No  . Drug use: No  . Sexual activity: No   Other Topics Concern  . Not on file   Social History Narrative   [place], [place] [employer]   Married, 1953   Lives with husband   2 grown children, one in [place], one out of state   Tobacco Use - No.    Drug Use - no   teaches Sunday school, reading     Review of Systems: General: negative for chills, fever, night sweats or weight changes.  Cardiovascular: negative for chest pain, dyspnea on exertion, edema, orthopnea, palpitations, paroxysmal nocturnal dyspnea or shortness of breath Dermatological: negative for rash Respiratory: negative for cough or wheezing Urologic: negative for hematuria Abdominal: negative for nausea, vomiting, diarrhea, bright red blood per rectum, melena, or hematemesis Neurologic: negative for visual changes, syncope, or dizziness All other systems reviewed and are otherwise negative except as noted above.    Blood pressure 118/64, pulse 80, height 5\' 5"  (1.651 m), weight 76.2 kg (168 lb).  General appearance: alert, cooperative and no distress Neck: no carotid bruit and no JVD Lungs: clear to auscultation bilaterally Heart: irregularly irregular rhythm Extremities: no LEE Pulses: 2+ and symmetric Skin: warm and dry Neurologic: Grossly normal  EKG  07/07/15   atrial fibrillation. 78 bpm  ASSESSMENT AND PLAN:   [facility]ib: failed [facility] She is unaware that she is in it rate control and anticoagulation strategy Toprol Works best for rate control No bleeding on low dose eliquis  Anemia  Last Hct 39  Stable   Anticoagulation on low dose eliquis given age, low body weight and rectus  sheath hematoma  LBBB stable no high grade heart block ECG q 6 months   Depression: I think this is the main issue regarding fatigue.  On Zoloft f/u primary    [employer] CorporationPeter Kolbi Altadonna

## 2016-01-26 ENCOUNTER — Ambulatory Visit (INDEPENDENT_AMBULATORY_CARE_PROVIDER_SITE_OTHER): Payer: Medicare Other | Admitting: Cardiovascular Disease

## 2016-01-26 VITALS — BP 118/64 | HR 80 | Ht 65.0 in | Wt 168.0 lb

## 2016-01-26 DIAGNOSIS — I482 Chronic atrial fibrillation, unspecified: Secondary | ICD-10-CM

## 2016-01-26 NOTE — Patient Instructions (Signed)

## 2016-02-05 ENCOUNTER — Other Ambulatory Visit: Payer: Self-pay | Admitting: Family Medicine

## 2016-03-16 ENCOUNTER — Encounter: Payer: Self-pay | Admitting: Primary Care

## 2016-03-16 ENCOUNTER — Ambulatory Visit (INDEPENDENT_AMBULATORY_CARE_PROVIDER_SITE_OTHER): Payer: Medicare Other | Admitting: Primary Care

## 2016-03-16 VITALS — BP 108/72 | HR 114 | Temp 97.6°F | Wt 161.5 lb

## 2016-03-16 DIAGNOSIS — B9789 Other viral agents as the cause of diseases classified elsewhere: Secondary | ICD-10-CM

## 2016-03-16 DIAGNOSIS — J069 Acute upper respiratory infection, unspecified: Secondary | ICD-10-CM

## 2016-03-16 MED ORDER — BENZONATATE 200 MG PO CAPS: 200.0000 mg | ORAL_CAPSULE | Freq: Three times a day (TID) | ORAL | 0 refills | Status: DC | PRN

## 2016-03-16 NOTE — Patient Instructions (Signed)
Your symptoms are representative of a viral illness which will resolve on its own over time. Our goal is to treat your symptoms in order to aid your body in the healing process and to make you more comfortable.   You may take Benzonatate capsules for cough. Take 1 capsule by mouth three times daily as needed for cough.  Please notify me Friday this week if your heart rate is not coming back down. I do suspect your heart rate will improve as you recover from your cold.  Please notify me sooner if you notice your heart rate increasing, increased dizziness/weakness, you start to feel worse.  It was a pleasure meeting you!

## 2016-03-16 NOTE — Progress Notes (Signed)
Subjective:    Patient ID: [name], female    DOB: [birth year], 82 y.o.   MRN: [id number]  HPI  [name] is an 82 year old female with a history of CHF, atrial fibrillation with RVR, MI who presents today with a chief complaint of cough. She also reports nasal congestion, fatigue, dizziness, post nasal drip, tachycardia.   Her symptoms began Saturday last weekend. She's taken children's Mucinex with improvement and is feeling much better today form her cold symptoms.   She has a history of atrial fibrillation and has noticed her heart rate running 107-130 intermittently since her symptoms began Saturday. She believes this is attributed to her cough as she will experience occasional coughing spells. She is concerned about her heart rate as she's noticed weakness, fatigue, and low energy.   She is managed on Diltiazem and Toprol XL for which she's been compliant. She did take two of her torsemide on Saturday and felt dizzy afterwards.   Review of Systems  Constitutional: Positive for fatigue.  HENT: Positive for congestion.   Respiratory: Positive for shortness of breath.   Cardiovascular: Positive for palpitations. Negative for chest pain.  Neurological: Positive for dizziness and weakness.       Past Medical History:  Diagnosis Date  . Arthritis    "knees" (06/18/2015)  . Atrial fibrillation (HCC)    h/o sig bleed on coumadin  . Atrial fibrillation with RVR (HCC) 06/18/2015  . CHF (congestive heart failure) (HCC)   . Complication of anesthesia 2010   "w/cardiac cath; got into a psychotic state for ~ 3 days; got me out w/Valium"  . Depression    "I have it off and on; not as often as I've gotten older" (06/18/2015)  . Diverticulosis    descending and sigmoid colon--Dr. Jarold Motto  . GERD (gastroesophageal reflux disease)   . Insomnia     off chronic ambien 5mg  as of 10/11, rare/episodic use since then  DCM/CHF  . Osteoporosis    on DXA 05/2010  . Sciatica    Right       Social History   Social History  . Marital status: Married    Spouse name: N/A  . Number of children: 2  . Years of education: N/A   Occupational History  . Retired Retired   Social History Main Topics  . Smoking status: Never Smoker  . Smokeless tobacco: Never Used  . Alcohol use No  . Drug use: No  . Sexual activity: No   Other Topics Concern  . Not on file   Social History Narrative   Retired, former [employer]   Married, 1953   Lives with husband   2 grown children, one in [place], one out of state   Tobacco Use - No.    Drug Use - no   teaches Sunday school, reading    Past Surgical History:  Procedure Laterality Date  . CARDIAC CATHETERIZATION  2010  . CARDIOVERSION N/A 06/19/2015   Procedure: CARDIOVERSION;  Surgeon: Laurey Morale, MD;  Location: [facility] ENDOSCOPY;  Service: Cardiovascular;  Laterality: N/A;  . CATARACT EXTRACTION W/ INTRAOCULAR LENS  IMPLANT, BILATERAL Bilateral   . DILATION AND CURETTAGE OF UTERUS    . TEE WITHOUT CARDIOVERSION N/A 06/19/2015   Procedure: TRANSESOPHAGEAL ECHOCARDIOGRAM (TEE);  Surgeon: Laurey Morale, MD;  Location: [facility] ENDOSCOPY;  Service: Cardiovascular;  Laterality: N/A;  . VAGINAL HYSTERECTOMY  1979   ovaries intact    Family History  Problem  Relation Age of Onset  . Cancer Father   . Colon cancer Father     possible colon cancer  . Tuberculosis Mother   . Breast cancer Neg Hx     Allergies  Allergen Reactions  . Ace Inhibitors     REACTION: cough  . Codeine Other (See Comments)    sedation  . Delsym [Dextromethorphan Polistirex Er] Other (See Comments)    dizziness  . Lactose Intolerance (Gi)     diarrhea  . Sulfamethoxazole-Trimethoprim     REACTION: GI intolerance.  . Warfarin Sodium Other (See Comments)    "bleed out"    Current Outpatient Prescriptions on File Prior to Visit  Medication Sig Dispense Refill  . acetaminophen (TYLENOL) 650 MG CR tablet Take 650 mg by mouth every 8 (eight)  hours as needed for pain.    [name]. apixaban (ELIQUIS) 2.5 MG TABS tablet Take 1 tablet (2.5 mg total) by mouth 2 (two) times daily. 60 tablet 11  . diltiazem (CARDIZEM CD) 120 MG 24 hr capsule Take 1 capsule (120 mg total) by mouth daily.    . metoprolol succinate (TOPROL XL) 25 MG 24 hr tablet Take 1 tablet (25 mg total) by mouth daily.    . ondansetron (ZOFRAN-ODT) 4 MG disintegrating tablet Take 4 mg by mouth every 8 (eight) hours as needed for nausea or vomiting.    . ranitidine (ZANTAC) 150 MG tablet Take 75 mg by mouth 2 (two) times daily as needed for heartburn.    . sertraline (ZOLOFT) 50 MG tablet Take 1 tablet (50 mg total) by mouth daily.    [name]. torsemide (DEMADEX) 20 MG tablet TAKE ONE TABLET BY MOUTH TWICE DAILY AS NEEDED 60 tablet 10   No current facility-administered medications on file prior to visit.     BP 108/72 (BP Location: Left Arm, Patient Position: Sitting, Cuff Size: Normal)   Pulse (!) 114   Temp 97.6 F (36.4 C) (Oral)   Wt 161 lb 8 oz (73.3 kg)   SpO2 98%   BMI 26.88 kg/m    Objective:   Physical Exam  Constitutional: She appears well-nourished.  HENT:  Right Ear: Tympanic membrane and ear canal normal.  Left Ear: Tympanic membrane and ear canal normal.  Nose: Right sinus exhibits no maxillary sinus tenderness and no frontal sinus tenderness. Left sinus exhibits no maxillary sinus tenderness and no frontal sinus tenderness.  Mouth/Throat: Oropharynx is clear and moist.  Eyes: Conjunctivae are normal.  Neck: Neck supple.  Cardiovascular:  Rate of 114 today, irregular.  Pulmonary/Chest: Effort normal. She has no wheezes. She has rhonchi in the left lower field. She has no rales.  Lymphadenopathy:    She has no cervical adenopathy.  Skin: Skin is warm and dry.          Assessment & Plan:  Viral URI with Cough:  Cough, congestion, fatigue, PND x 4 days. Overall feeling better from cold symptoms. Exam today with mild rhonchi to left lower lobe,  otherwise unremarkable. Tachycardic at 114 after recheck, initially 137, irregular rhythm.  Suspect tachycardia related to viral URI/dehydration. Cannot increase Diltiazem or Toprol XL given lower blood pressure.  Will provide her with a prescription for Tessalon Pearls to help suppress cough, therefore reducing heart rate as I do believe cough or dehydration to be cause for tachycardia. Discussed to hydrate with water, continue all medications, call Friday if no improvement in cough or heart rate, or if symptoms become worse.  Morrie Sheldonlark,Contrell Ballentine Kendal, NP

## 2016-03-18 ENCOUNTER — Encounter: Payer: Self-pay | Admitting: Primary Care

## 2016-03-28 ENCOUNTER — Encounter: Payer: Self-pay | Admitting: Family Medicine

## 2016-03-29 ENCOUNTER — Other Ambulatory Visit: Payer: Self-pay | Admitting: Family Medicine

## 2016-03-29 MED ORDER — SERTRALINE HCL 50 MG PO TABS: 50.0000 mg | ORAL_TABLET | Freq: Every day | ORAL | Status: DC

## 2016-06-29 ENCOUNTER — Other Ambulatory Visit: Payer: Self-pay | Admitting: *Deleted

## 2016-06-29 MED ORDER — APIXABAN 2.5 MG PO TABS: 2.5000 mg | ORAL_TABLET | Freq: Two times a day (BID) | ORAL | 5 refills | Status: DC

## 2016-06-29 NOTE — Telephone Encounter (Signed)
Request received for Eliquis 2.5mg ; pt is 83 years old, weight-73.3kg, Creatinine-0.91 on 12/30/15, & was last seen by [name] on 01/26/16. Per [name] note on 01/26/16: "prior spontaneous retroperitoneal bleed on Coumadin, admtted 06/18/15 through the ED with weakness and dizziness and found to have AF with RVR. She was admitted for rate control and set up for TEE CV 06/19/15. She had TEE CV 06/19/15 then had a right rectus sheath hematoma while on Heparin and anticoagulation was stopped. She was seen in consult by Dr Luisa Hart who did not feel surgery was indicated. She did not require transfusion. She then reverted back to AF. After discussion  the plan will be for low dose Eliquis and rate control".  Therefore, Eliquis 2.5mg  sent as requested to Pharmacy

## 2016-07-12 ENCOUNTER — Encounter: Payer: Self-pay | Admitting: Cardiovascular Disease

## 2016-07-15 ENCOUNTER — Encounter: Payer: Self-pay | Admitting: *Deleted

## 2016-07-31 NOTE — Progress Notes (Signed)
Patient ID: [name], female   DOB: [birth year], 83 y.o.   MRN: [id number]   08/03/2016 [name]   1930-06-28  [id number]  Primary Physician Crawford Givens, MD Primary Cardiologist: [name]   Reason for Visit:  Atrial Fibrillation    HPI:  83 y.o.  female with a hx of PAF, NICM, systolic HF, LBBB, prior spontaneous retroperitoneal bleed on Coumadin, admtted 06/18/15 through the ED with weakness and dizziness and found to have AF with RVR. She was admitted for rate control and set up for TEE CV 06/19/15. She had TEE CV 06/19/15 then had a right rectus sheath hematoma while on Heparin and anticoagulation was stopped. She was seen in consult by Dr Luisa Hart who did not feel surgery was indicated. She did not require transfusion. She then reverted back to AF. After discussion  the plan will be for low dose Eliquis and rate control.   Seen by PA March Afib on EKG but rate is controlled in the 70s. She reports full compliance with her rate control agents and with Eliquis. She denies any abnormal bleeding. Her main complaint is constant fatigue. No dyspnea, palpitations, chest pain, dizziness, syncope/ near syncope. Her Hgb when she was discharged was 11.   06/19/15 Study Conclusions  - Left ventricle: The cavity size was normal. Wall thickness was  normal. The estimated ejection fraction was 50%. Diffuse  hypokinesis. - Aortic valve: There was no stenosis. There was trivial  regurgitation. - Aorta: Normal caliber thoracic aorta with minimal plaque. - Mitral valve: There was trivial regurgitation. - Left atrium: The atrium was moderately to severely dilated. No  evidence of thrombus in the atrial cavity or appendage. - Right ventricle: The cavity size was normal. Systolic function  was normal. - Right atrium: The atrium was moderately to severely dilated. - Atrial septum: No defect or patent foramen ovale was identified. - Tricuspid valve: There was moderate regurgitation. Peak  RV-RA  gradient (S): 24 mm Hg.  Lab Results  Component Value Date   HCT 39.7 12/30/2015    After talking with her she seems to indicate depression more related to fatigue than any physical issue Has had clinical depression with suicidal ideation in past and has seen counselor. Started on Zoloft  with some benefit  Digoxen and Toprol stopped 08/11/15 to see if fatigue improved   Current Outpatient Prescriptions  Medication Sig Dispense Refill  . acetaminophen (TYLENOL) 650 MG CR tablet Take 650 mg by mouth every 8 (eight) hours as needed for pain.    [name] apixaban (ELIQUIS) 2.5 MG TABS tablet Take 1 tablet (2.5 mg total) by mouth 2 (two) times daily. 60 tablet 5  . B Complex-C (SUPER B COMPLEX PO) Take 1 capsule by mouth daily.    [name] diltiazem (CARDIZEM CD) 120 MG 24 hr capsule Take 1 capsule (120 mg total) by mouth daily.    . metoprolol succinate (TOPROL XL) 25 MG 24 hr tablet Take 1 tablet (25 mg total) by mouth daily.    . naproxen sodium (ANAPROX) 220 MG tablet Take 220 mg by mouth at bedtime.    . ranitidine (ZANTAC) 150 MG tablet Take 75 mg by mouth 2 (two) times daily as needed for heartburn.    . sertraline (ZOLOFT) 50 MG tablet Take 1-2 tablets (50-100 mg total) by mouth daily.    [name] torsemide (DEMADEX) 20 MG tablet TAKE ONE TABLET BY MOUTH TWICE DAILY AS NEEDED 60 tablet 10   No current facility-administered medications  for this visit.     Allergies  Allergen Reactions  . Ace Inhibitors     REACTION: cough  . Codeine Other (See Comments)    sedation  . Delsym [Dextromethorphan Polistirex Er] Other (See Comments)    dizziness  . Lactose Intolerance (Gi)     diarrhea  . Sulfamethoxazole-Trimethoprim     REACTION: GI intolerance.  . Warfarin Sodium Other (See Comments)    "bleed out"    Social History   Social History  . Marital status: Married    Spouse name: N/A  . Number of children: 2  . Years of education: N/A   Occupational History  . Retired Retired    Social History Main Topics  . Smoking status: Never Smoker  . Smokeless tobacco: Never Used  . Alcohol use No  . Drug use: No  . Sexual activity: No   Other Topics Concern  . Not on file   Social History Narrative   Retired, former [employer]   Married, 1953   Lives with husband   2 grown children, one in [place], one out of state   Tobacco Use - No.    Drug Use - no   teaches Sunday school, reading     Review of Systems: General: negative for chills, fever, night sweats or weight changes.  Cardiovascular: negative for chest pain, dyspnea on exertion, edema, orthopnea, palpitations, paroxysmal nocturnal dyspnea or shortness of breath Dermatological: negative for rash Respiratory: negative for cough or wheezing Urologic: negative for hematuria Abdominal: negative for nausea, vomiting, diarrhea, bright red blood per rectum, melena, or hematemesis Neurologic: negative for visual changes, syncope, or dizziness All other systems reviewed and are otherwise negative except as noted above.    Blood pressure 100/60, pulse 96, height 5\' 5"  (1.651 m), weight 166 lb 12.8 oz (75.7 kg), SpO2 96 %.   Affect appropriate Healthy:  appears stated age HEENT: normal Neck supple with no adenopathy JVP normal no bruits no thyromegaly Lungs clear with no wheezing and good diaphragmatic motion Heart:  S1/S2 no murmur, no rub, gallop or click PMI normal Abdomen: benighn, BS positve, no tenderness, no AAA no bruit.  No HSM or HJR Distal pulses intact with no bruits No edema Neuro non-focal Skin warm and dry No muscular weakness   EKG  07/07/15   atrial fibrillation. 78 bpm 08/03/16  afib rate 98 LAD ICLBBB   ASSESSMENT AND PLAN:   Afib: failed [facility] She is unaware that she is in it rate control and anticoagulation strategy Toprol Works best for rate control No bleeding on low dose eliquis  Anemia  Last Hct 39  Stable   Anticoagulation on low dose eliquis given age, low body  weight and rectus sheath hematoma  LBBB stable no high grade heart block ECG q 6 months   Depression: I think this is the main issue regarding fatigue.  On Zoloft f/u primary    [employer]

## 2016-08-03 ENCOUNTER — Ambulatory Visit (INDEPENDENT_AMBULATORY_CARE_PROVIDER_SITE_OTHER): Payer: Medicare Other | Admitting: Cardiovascular Disease

## 2016-08-03 ENCOUNTER — Encounter: Payer: Self-pay | Admitting: Cardiovascular Disease

## 2016-08-03 VITALS — BP 100/60 | HR 96 | Ht 65.0 in | Wt 166.8 lb

## 2016-08-03 DIAGNOSIS — I482 Chronic atrial fibrillation, unspecified: Secondary | ICD-10-CM

## 2016-08-03 MED ORDER — TORSEMIDE 20 MG PO TABS: 20.0000 mg | ORAL_TABLET | Freq: Every day | ORAL | 3 refills | Status: DC

## 2016-08-03 NOTE — Patient Instructions (Addendum)

## 2016-08-20 ENCOUNTER — Encounter: Payer: Self-pay | Admitting: Cardiovascular Disease

## 2016-08-30 ENCOUNTER — Ambulatory Visit (INDEPENDENT_AMBULATORY_CARE_PROVIDER_SITE_OTHER): Payer: Medicare Other | Admitting: Family Medicine

## 2016-08-30 ENCOUNTER — Encounter: Payer: Self-pay | Admitting: Family Medicine

## 2016-08-30 DIAGNOSIS — F339 Major depressive disorder, recurrent, unspecified: Secondary | ICD-10-CM

## 2016-08-30 DIAGNOSIS — K589 Irritable bowel syndrome without diarrhea: Secondary | ICD-10-CM | POA: Diagnosis not present

## 2016-08-30 MED ORDER — SIMETHICONE 80 MG PO CHEW: 80.0000 mg | CHEWABLE_TABLET | Freq: Every day | ORAL | Status: DC | PRN

## 2016-08-30 NOTE — Progress Notes (Signed)
Mood d/w pt.  She went from 50 to 75mg  a day.  Still with intermittent depression, but some better.  She has a "good" day most of the days, but not always.  The mornings are the hardest time for her, most trouble to get moving.  "I pray and I get up."  She usually feels better after reading an AM devotional and drinking some coffee.  No SI/HI.  D/w pt about possibly going up to 2 pills a day.  She can update me as needed.    IBS sx.  She has episodic sx.  She is some better in the meantime with cutting out raw veggies and sugars.  When she has a flare, she has inc in diarrhea and gas.  She started a probiotic in the meantime.  She tried cutting out lactose w/o improvement.    Meds, vitals, and allergies reviewed.   ROS: Per HPI unless specifically indicated in ROS section   GEN: nad, alert and oriented HEENT: mucous membranes moist NECK: supple w/o LA CV: IRR.   PULM: ctab, no inc wob ABD: soft, +bs, not tender to palpation. No rebound.  EXT: no edema SKIN: no acute rash

## 2016-08-30 NOTE — Patient Instructions (Signed)
Up the sertraline to 2 tabs a day.   Add on gas x daily in the meantime.   If you still have troubles with diarrhea, then start taking imodium daily as needed.  Take care.  Glad to see you.

## 2016-08-31 DIAGNOSIS — K589 Irritable bowel syndrome without diarrhea: Secondary | ICD-10-CM | POA: Insufficient documentation

## 2016-08-31 NOTE — Assessment & Plan Note (Signed)
Discussed with patient about options. Add on gas x daily in the meantime.   If still having troubles with diarrhea, then start taking imodium daily as needed, up to twice a day as needed. If she is still having trouble after that she will let me know. Okay for outpatient follow-up. No red flag symptoms, no blood seen in stool.

## 2016-08-31 NOTE — Assessment & Plan Note (Signed)
No SI/HI.  D/w pt about possibly going up to 2 pills a day of sertraline, total of 100 mg a day.  She can update me as needed. Okay for outpatient follow-up. She agrees with plan. >25 minutes spent in face to face time with patient, >50% spent in counselling or coordination of care.

## 2016-09-08 ENCOUNTER — Other Ambulatory Visit: Payer: Self-pay | Admitting: Family Medicine

## 2016-09-08 ENCOUNTER — Encounter: Payer: Self-pay | Admitting: Family Medicine

## 2016-09-08 DIAGNOSIS — K589 Irritable bowel syndrome without diarrhea: Secondary | ICD-10-CM

## 2016-09-12 ENCOUNTER — Encounter: Payer: Self-pay | Admitting: Gastroenterology

## 2016-09-16 ENCOUNTER — Ambulatory Visit (INDEPENDENT_AMBULATORY_CARE_PROVIDER_SITE_OTHER): Payer: Medicare Other | Admitting: Family Medicine

## 2016-09-16 ENCOUNTER — Encounter: Payer: Self-pay | Admitting: Family Medicine

## 2016-09-16 DIAGNOSIS — R197 Diarrhea, unspecified: Secondary | ICD-10-CM

## 2016-09-16 DIAGNOSIS — F339 Major depressive disorder, recurrent, unspecified: Secondary | ICD-10-CM

## 2016-09-16 MED ORDER — CHOLESTYRAMINE 4 G PO PACK: 4.0000 g | PACK | Freq: Every day | ORAL | 12 refills | Status: DC

## 2016-09-16 NOTE — Patient Instructions (Signed)
Try adding on cholestyramine in the meantime, once a day.  See if that helps.  Update me as needed.  Take care.  Glad to see you.

## 2016-09-16 NOTE — Progress Notes (Signed)
Recheck pulse still irregular but 80s-90s.    She has more and more diarrhea w/o relief.  OTC meds helps minimally.  She has bloating and more gas and fecal urgency.  She had to sleep in depends.  She has to limit activity due to her sx.  His is a profound inconvenience.  She is fatigued.    She had to cut her torsemide due diarrhea.    She increased her sertraline to 75mg  a day and that is helping some with her mood.    No blood in stool.  No vomiting, no nausea.    Meds, vitals, and allergies reviewed.   ROS: Per HPI unless specifically indicated in ROS section   nad ncat Mmm Neck supple, no LA IRR, not tachy ctab abd soft, hyperactive BS, not ttp, no rebound Ext w/o edema

## 2016-09-17 NOTE — Assessment & Plan Note (Signed)
Reasonable to add on Questran once a day and see if that helps. She will update me as needed. Routine cautions given. She is on the cancellation list to the GI clinic to see if she can be seen sooner. At this point still okay for outpatient follow-up.

## 2016-09-17 NOTE — Assessment & Plan Note (Signed)
Mood improved with higher dose of sertraline. Continue as is. I do not think this med contributed to her diarrhea.

## 2016-09-19 ENCOUNTER — Encounter: Payer: Self-pay | Admitting: Family Medicine

## 2016-09-20 ENCOUNTER — Encounter: Payer: Self-pay | Admitting: Family Medicine

## 2016-09-20 NOTE — Telephone Encounter (Addendum)
Pt called to verify it is OK to cancel appt with Dr Lurlean Nanny has already cancelled appt) since seeing GI doctor next week; pt having a lot of gas and watery diarrhea.No blood. Diarrhea is in the evening not during the day. Pt eating toast and rice and drinking water and pedialyte. Pt has no pain. The diarrhea has been going on since first of May.in the last 24 hrs had watery diarrhea around 8 times. Pt does have dry mouth and hoarseness due to dryness in throat. Pt request cb at [phone]. Will send note to Dr Reece Agar who is in office today and Dr Para March.

## 2016-09-20 NOTE — Telephone Encounter (Signed)
Noted. No better with questran. Will await recs by PCP tomorrow. ?lymphocytic colitis. Agree with expedited GI referral. Consider lomotil. Small sips throughout the day to ensure staying hydrated.

## 2016-09-22 ENCOUNTER — Ambulatory Visit: Payer: Self-pay | Admitting: Family Medicine

## 2016-09-23 ENCOUNTER — Encounter: Payer: Self-pay | Admitting: Family Medicine

## 2016-09-26 ENCOUNTER — Encounter: Payer: Self-pay | Admitting: Family Medicine

## 2016-09-26 LAB — HM MAMMOGRAPHY

## 2016-09-27 ENCOUNTER — Encounter: Payer: Self-pay | Admitting: Gastroenterology

## 2016-09-27 ENCOUNTER — Other Ambulatory Visit (INDEPENDENT_AMBULATORY_CARE_PROVIDER_SITE_OTHER): Payer: Medicare Other

## 2016-09-27 ENCOUNTER — Encounter: Payer: Self-pay | Admitting: Cardiovascular Disease

## 2016-09-27 ENCOUNTER — Ambulatory Visit (INDEPENDENT_AMBULATORY_CARE_PROVIDER_SITE_OTHER): Payer: Medicare Other | Admitting: Gastroenterology

## 2016-09-27 VITALS — BP 86/68 | HR 100 | Ht 65.0 in | Wt 165.8 lb

## 2016-09-27 DIAGNOSIS — R197 Diarrhea, unspecified: Secondary | ICD-10-CM | POA: Diagnosis not present

## 2016-09-27 DIAGNOSIS — K52832 Lymphocytic colitis: Secondary | ICD-10-CM

## 2016-09-27 DIAGNOSIS — K580 Irritable bowel syndrome with diarrhea: Secondary | ICD-10-CM

## 2016-09-27 DIAGNOSIS — K52839 Microscopic colitis, unspecified: Secondary | ICD-10-CM | POA: Insufficient documentation

## 2016-09-27 HISTORY — DX: Lymphocytic colitis: K52.832

## 2016-09-27 LAB — COMPREHENSIVE METABOLIC PANEL
ALT: 20 U/L (ref 0–35)
AST: 24 U/L (ref 0–37)
Albumin: 3.8 g/dL (ref 3.5–5.2)
Alkaline Phosphatase: 94 U/L (ref 39–117)
BUN: 9 mg/dL (ref 6–23)
CO2: 28 mEq/L (ref 19–32)
Calcium: 9.4 mg/dL (ref 8.4–10.5)
Chloride: 103 mEq/L (ref 96–112)
Creatinine, Ser: 0.81 mg/dL (ref 0.40–1.20)
GFR: 71.66 mL/min (ref >60.00)
Glucose, Bld: 107 mg/dL — ABNORMAL HIGH (ref 70–99)
Potassium: 4.2 mEq/L (ref 3.5–5.1)
Sodium: 138 mEq/L (ref 135–145)
Total Bilirubin: 0.7 mg/dL (ref 0.2–1.2)
Total Protein: 6.4 g/dL (ref 6.0–8.3)

## 2016-09-27 LAB — CBC WITH DIFFERENTIAL/PLATELET
Basophils Absolute: 0 10*3/uL (ref 0.0–0.1)
Basophils Relative: 0.7 % (ref 0.0–3.0)
Eosinophils Absolute: 0.1 10*3/uL (ref 0.0–0.7)
Eosinophils Relative: 1.5 % (ref 0.0–5.0)
HCT: 37.5 % (ref 36.0–46.0)
Hemoglobin: 12.4 g/dL (ref 12.0–15.0)
Lymphocytes Relative: 33.4 % (ref 12.0–46.0)
Lymphs Abs: 2.3 10*3/uL (ref 0.7–4.0)
MCHC: 33.2 g/dL (ref 30.0–36.0)
MCV: 92.3 fl (ref 78.0–100.0)
Monocytes Absolute: 0.8 10*3/uL (ref 0.1–1.0)
Monocytes Relative: 11.2 % (ref 3.0–12.0)
Neutro Abs: 3.6 10*3/uL (ref 1.4–7.7)
Neutrophils Relative %: 53.2 % (ref 43.0–77.0)
Platelets: 293 10*3/uL (ref 150.0–400.0)
RBC: 4.06 Mil/uL (ref 3.87–5.11)
RDW: 13.3 % (ref 11.5–15.5)
WBC: 6.8 10*3/uL (ref 4.0–10.5)

## 2016-09-27 LAB — C-REACTIVE PROTEIN: CRP: 1.5 mg/dL (ref 0.5–20.0)

## 2016-09-27 LAB — TSH: TSH: 2.23 u[IU]/mL (ref 0.35–4.50)

## 2016-09-27 LAB — IGA: IgA: 170 mg/dL (ref 68–378)

## 2016-09-27 NOTE — Progress Notes (Addendum)
09/27/2016 [name] [id number] 09-24-1930   HISTORY OF PRESENT ILLNESS:  This is a pleasant 83 year old female who is previously known to Dr. Jarold Motto. Her last colonoscopy was in March 2006 at which time she was found to have only diverticulosis. She is here today at the request of her PCP, Dr. Para March, for evaluation regarding diarrhea. She tells me that on and off most of her life she's had episodes of diarrhea and carries a diagnosis of IBS. She says that starting May 1, however, she's had persistent diarrhea. She has explosive watery stools at least 4-5 times daily and has been waking up to have nocturnal bowel movements as well; says that it seems to be worse in the evenings and at night. Says that she always has to know where the bathroom is when she goes somewhere and is afraid to eat out at restaurants anymore. Complains of a lot of gas/bloating. Says that she has been taking some Imodium, which helps minimally. Then she was given Questran by her PCP and she took that once a day then increased to twice a day and it seemed to make her diarrhea worse. She denies seeing blood in her stool. She says that she believes she has become lactose intolerant over the years so dairy products seem to bother her, but she tries to watch with those. Her usual IBS symptoms have never lasted this long, occasionally bothering her for a day or 2 at a time.     Past Medical History:  Diagnosis Date  . Arthritis    "knees" (06/18/2015)  . Atrial fibrillation (HCC)    h/o sig bleed on coumadin  . Atrial fibrillation with RVR (HCC) 06/18/2015  . CHF (congestive heart failure) (HCC)   . Complication of anesthesia 2010   "w/cardiac cath; got into a psychotic state for ~ 3 days; got me out w/Valium"  . Depression    "I have it off and on; not as often as I've gotten older" (06/18/2015)  . Diverticulosis    descending and sigmoid colon--Dr. Jarold Motto  . GERD (gastroesophageal reflux disease)   . Insomnia       off chronic ambien 5mg  as of 10/11, rare/episodic use since then  DCM/CHF  . Osteoporosis    on DXA 05/2010  . Sciatica    Right   Past Surgical History:  Procedure Laterality Date  . CARDIAC CATHETERIZATION  2010  . CARDIOVERSION N/A 06/19/2015   Procedure: CARDIOVERSION;  Surgeon: Laurey Morale, MD;  Location: [facility] ENDOSCOPY;  Service: Cardiovascular;  Laterality: N/A;  . CATARACT EXTRACTION W/ INTRAOCULAR LENS  IMPLANT, BILATERAL Bilateral   . DILATION AND CURETTAGE OF UTERUS    . TEE WITHOUT CARDIOVERSION N/A 06/19/2015   Procedure: TRANSESOPHAGEAL ECHOCARDIOGRAM (TEE);  Surgeon: Laurey Morale, MD;  Location: [facility] ENDOSCOPY;  Service: Cardiovascular;  Laterality: N/A;  . VAGINAL HYSTERECTOMY  1979   ovaries intact    reports that she has never smoked. She has never used smokeless tobacco. She reports that she does not drink alcohol or use drugs. family history includes Cancer in her father; Colon cancer in her father; Tuberculosis in her mother. Allergies  Allergen Reactions  . Ace Inhibitors     REACTION: cough  . Codeine Other (See Comments)    sedation  . Delsym [Dextromethorphan Polistirex Er] Other (See Comments)    dizziness  . Lactose Intolerance (Gi)     diarrhea  . Sulfamethoxazole-Trimethoprim     REACTION: GI intolerance.  [name]  Warfarin Sodium Other (See Comments)    "bleed out"      Outpatient Encounter Prescriptions as of 09/27/2016  Medication Sig  . acetaminophen (TYLENOL) 650 MG CR tablet Take 650 mg by mouth every 8 (eight) hours as needed for pain.  [name] apixaban (ELIQUIS) 2.5 MG TABS tablet Take 1 tablet (2.5 mg total) by mouth 2 (two) times daily.  . B Complex-C (SUPER B COMPLEX PO) Take 1 capsule by mouth daily.  . Biotin 1000 MCG tablet Take 1,000 mcg by mouth 3 (three) times daily.  [name] diltiazem (CARDIZEM CD) 120 MG 24 hr capsule Take 1 capsule (120 mg total) by mouth daily.  . metoprolol succinate (TOPROL XL) 25 MG 24 hr tablet Take 1 tablet (25 mg  total) by mouth daily.  . ranitidine (ZANTAC) 150 MG tablet Take 75 mg by mouth 2 (two) times daily as needed for heartburn.  . sertraline (ZOLOFT) 50 MG tablet Take 1-2 tablets (50-100 mg total) by mouth daily.  . simethicone (MYLICON) 80 MG chewable tablet Chew 1 tablet (80 mg total) by mouth daily as needed.  . torsemide (DEMADEX) 20 MG tablet Take 1 tablet (20 mg total) by mouth daily. PT TAKES AN EXTRA 20 MG BY MOUTH A DAY IF WEIGHT > THEN 2 POUNDS.  . [DISCONTINUED] cholestyramine (QUESTRAN) 4 g packet Take 1 packet (4 g total) by mouth daily.  . [DISCONTINUED] naproxen sodium (ANAPROX) 220 MG tablet Take 220 mg by mouth at bedtime.   No facility-administered encounter medications on file as of 09/27/2016.      REVIEW OF SYSTEMS  : All other systems reviewed and negative except where noted in the History of Present Illness.   PHYSICAL EXAM: BP (!) 86/68   Pulse 100   Ht 5\' 5"  (1.651 m)   Wt 165 lb 12.8 oz (75.2 kg)   BMI 27.59 kg/m  General: Well developed white female in no acute distress Head: Normocephalic and atraumatic Eyes:  Sclerae anicteric, conjunctiva pink. Ears: Normal auditory acuity Lungs: Clear throughout to auscultation; no increased WOB. Heart: Irregularly irregular. Abdomen: Soft, non-distended.  BS present.  Non-tender. Musculoskeletal: Symmetrical with no gross deformities  Skin: No lesions on visible extremities Extremities: No edema  Neurological: Alert oriented x 4, grossly non-focal Psychological:  Alert and cooperative. Normal mood and affect  ASSESSMENT AND PLAN: -Acute on chronic diarrhea:  Likely has long-standing IBS-D as she has experienced issues with diarrhea on and off for years.  Recently about 6-7 weeks of persistent diarrhea.  Will start with labs and stool studies; will check CBC, CMP, TSH, celiac labs as well as GI pathogen panel.  If all of this is negative and symptoms continue then may consider empiric course of flagyl and  colonoscopy.  CC:  [name], MD  Ended up + for C diff and Tx started  Agree with Ms. Rise Mu management.  Iva Boop, MD, Clementeen Graham

## 2016-09-27 NOTE — Patient Instructions (Signed)
Your physician has requested that you go to the basement for  lab work before leaving today.   

## 2016-09-28 ENCOUNTER — Other Ambulatory Visit: Payer: Medicare Other

## 2016-09-28 DIAGNOSIS — R197 Diarrhea, unspecified: Secondary | ICD-10-CM

## 2016-09-28 DIAGNOSIS — K580 Irritable bowel syndrome with diarrhea: Secondary | ICD-10-CM

## 2016-09-28 LAB — TISSUE TRANSGLUTAMINASE, IGA: Tissue Transglutaminase Ab, IgA: 1 U/mL (ref <4)

## 2016-09-29 ENCOUNTER — Encounter: Payer: Self-pay | Admitting: Cardiovascular Disease

## 2016-09-29 ENCOUNTER — Other Ambulatory Visit: Payer: Self-pay

## 2016-09-29 ENCOUNTER — Encounter: Payer: Self-pay | Admitting: Family Medicine

## 2016-09-29 LAB — GASTROINTESTINAL PATHOGEN PANEL PCR
C. difficile Tox A/B, PCR: DETECTED — CR
Campylobacter, PCR: NOT DETECTED
Cryptosporidium, PCR: NOT DETECTED
E coli (ETEC) LT/ST PCR: NOT DETECTED
E coli (STEC) stx1/stx2, PCR: NOT DETECTED
E coli 0157, PCR: NOT DETECTED
Giardia lamblia, PCR: NOT DETECTED
Norovirus, PCR: NOT DETECTED
Rotavirus A, PCR: NOT DETECTED
Salmonella, PCR: NOT DETECTED
Shigella, PCR: NOT DETECTED

## 2016-09-29 MED ORDER — VANCOMYCIN HCL 125 MG PO CAPS: 125.0000 mg | ORAL_CAPSULE | Freq: Four times a day (QID) | ORAL | 0 refills | Status: AC

## 2016-09-30 ENCOUNTER — Encounter: Payer: Self-pay | Admitting: Cardiovascular Disease

## 2016-10-02 ENCOUNTER — Other Ambulatory Visit: Payer: Self-pay | Admitting: Cardiovascular Disease

## 2016-10-05 ENCOUNTER — Other Ambulatory Visit: Payer: Self-pay | Admitting: Family Medicine

## 2016-10-05 MED ORDER — SERTRALINE HCL 50 MG PO TABS: 100.0000 mg | ORAL_TABLET | Freq: Every day | ORAL | 3 refills | Status: DC

## 2016-10-05 NOTE — Telephone Encounter (Signed)
Sent. Thanks.   

## 2016-10-13 ENCOUNTER — Ambulatory Visit: Payer: Medicare Other | Admitting: Gastroenterology

## 2016-10-13 ENCOUNTER — Encounter: Payer: Self-pay | Admitting: Gastroenterology

## 2016-10-13 ENCOUNTER — Encounter: Payer: Self-pay | Admitting: Family Medicine

## 2016-10-14 ENCOUNTER — Other Ambulatory Visit: Payer: Self-pay | Admitting: Cardiovascular Disease

## 2016-11-30 ENCOUNTER — Encounter: Payer: Self-pay | Admitting: Gastroenterology

## 2016-12-01 ENCOUNTER — Other Ambulatory Visit: Payer: Self-pay

## 2016-12-01 DIAGNOSIS — R197 Diarrhea, unspecified: Secondary | ICD-10-CM

## 2016-12-05 ENCOUNTER — Encounter: Payer: Self-pay | Admitting: Gastroenterology

## 2016-12-06 ENCOUNTER — Other Ambulatory Visit: Payer: Medicare Other

## 2016-12-07 ENCOUNTER — Other Ambulatory Visit: Payer: Medicare Other

## 2016-12-07 DIAGNOSIS — R197 Diarrhea, unspecified: Secondary | ICD-10-CM

## 2016-12-08 LAB — CLOSTRIDIUM DIFFICILE BY PCR

## 2016-12-16 ENCOUNTER — Encounter: Payer: Self-pay | Admitting: Family Medicine

## 2016-12-16 ENCOUNTER — Encounter: Payer: Self-pay | Admitting: Cardiovascular Disease

## 2016-12-22 ENCOUNTER — Ambulatory Visit: Payer: Medicare Other

## 2016-12-30 ENCOUNTER — Ambulatory Visit (INDEPENDENT_AMBULATORY_CARE_PROVIDER_SITE_OTHER): Payer: Medicare Other

## 2016-12-30 DIAGNOSIS — Z23 Encounter for immunization: Secondary | ICD-10-CM

## 2017-01-09 ENCOUNTER — Other Ambulatory Visit: Payer: Self-pay | Admitting: Cardiovascular Disease

## 2017-01-11 ENCOUNTER — Encounter: Payer: Self-pay | Admitting: Cardiovascular Disease

## 2017-01-11 NOTE — Telephone Encounter (Signed)
Pt last saw Dr Eden Emms 08/03/16, per Dr Fabio Bering last note "Anticoagulation on low dose eliquis given age, low body weight and rectus sheath hematoma." Weight 75.2kg, age 83, last labs 09/27/16 Creat 0.81, based on specified criteria pt should be on Eliquis 5mg  BID, but since Dr Eden Emms specified pt is to be on low dose Eliquis 2.5mg  BID at last OV on 08/03/16.  Will refill current rx.

## 2017-01-12 ENCOUNTER — Other Ambulatory Visit: Payer: Self-pay | Admitting: *Deleted

## 2017-01-12 MED ORDER — APIXABAN 2.5 MG PO TABS: 2.5000 mg | ORAL_TABLET | Freq: Two times a day (BID) | ORAL | 6 refills | Status: DC

## 2017-01-30 ENCOUNTER — Ambulatory Visit (INDEPENDENT_AMBULATORY_CARE_PROVIDER_SITE_OTHER): Payer: Medicare Other

## 2017-01-30 VITALS — BP 92/70 | HR 91 | Temp 97.7°F | Ht 64.75 in | Wt 165.2 lb

## 2017-01-30 DIAGNOSIS — Z0000 Encounter for general adult medical examination without abnormal findings: Secondary | ICD-10-CM | POA: Diagnosis not present

## 2017-01-30 DIAGNOSIS — M818 Other osteoporosis without current pathological fracture: Secondary | ICD-10-CM | POA: Diagnosis not present

## 2017-01-30 DIAGNOSIS — I420 Dilated cardiomyopathy: Secondary | ICD-10-CM | POA: Diagnosis not present

## 2017-01-30 LAB — LIPID PANEL
Cholesterol: 209 mg/dL — ABNORMAL HIGH (ref 0–200)
HDL: 55.5 mg/dL (ref >39.00)
LDL Cholesterol: 124 mg/dL — ABNORMAL HIGH (ref 0–99)
NonHDL: 153.96
Total CHOL/HDL Ratio: 4
Triglycerides: 151 mg/dL — ABNORMAL HIGH (ref 0.0–149.0)
VLDL: 30.2 mg/dL (ref 0.0–40.0)

## 2017-01-30 LAB — BASIC METABOLIC PANEL
BUN: 23 mg/dL (ref 6–23)
CO2: 32 mEq/L (ref 19–32)
Calcium: 9.5 mg/dL (ref 8.4–10.5)
Chloride: 98 mEq/L (ref 96–112)
Creatinine, Ser: 1.06 mg/dL (ref 0.40–1.20)
GFR: 52.5 mL/min — ABNORMAL LOW (ref >60.00)
Glucose, Bld: 97 mg/dL (ref 70–99)
Potassium: 3.8 mEq/L (ref 3.5–5.1)
Sodium: 139 mEq/L (ref 135–145)

## 2017-01-30 LAB — VITAMIN D 25 HYDROXY (VIT D DEFICIENCY, FRACTURES): VITD: 22.42 ng/mL — ABNORMAL LOW (ref 30.00–100.00)

## 2017-01-30 NOTE — Patient Instructions (Signed)
Ms. Tienda , Thank you for taking time to come for your Medicare Wellness Visit. I appreciate your ongoing commitment to your health goals. Please review the following plan we discussed and let me know if I can assist you in the future.   These are the goals we discussed: Goals    . Increase water intake          Starting 01/30/2017, I will attempt to drink at least one glass of water with each meal.        This is a list of the screening recommended for you and due dates:  Health Maintenance  Topic Date Due  . DEXA scan (bone density measurement)  12/29/2025*  . Tetanus Vaccine  05/13/2020  . Flu Shot  Completed  . Pneumonia vaccines  Completed  *Topic was postponed. The date shown is not the original due date.   Preventive Care for Adults  A healthy lifestyle and preventive care can promote health and wellness. Preventive health guidelines for adults include the following key practices.  . A routine yearly physical is a good way to check with your health care provider about your health and preventive screening. It is a chance to share any concerns and updates on your health and to receive a thorough exam.  . Visit your dentist for a routine exam and preventive care every 6 months. Brush your teeth twice a day and floss once a day. Good oral hygiene prevents tooth decay and gum disease.  . The frequency of eye exams is based on your age, health, family medical history, use  of contact lenses, and other factors. Follow your health care provider's recommendations for frequency of eye exams.  . Eat a healthy diet. Foods like vegetables, fruits, whole grains, low-fat dairy products, and lean protein foods contain the nutrients you need without too many calories. Decrease your intake of foods high in solid fats, added sugars, and salt. Eat the right amount of calories for you. Get information about a proper diet from your health care provider, if necessary.  . Regular physical  exercise is one of the most important things you can do for your health. Most adults should get at least 150 minutes of moderate-intensity exercise (any activity that increases your heart rate and causes you to sweat) each week. In addition, most adults need muscle-strengthening exercises on 2 or more days a week.  Silver Sneakers may be a benefit available to you. To determine eligibility, you may visit the website: www.silversneakers.com or contact program at [phone] Mon-Fri between 8AM-8PM.   . Maintain a healthy weight. The body mass index (BMI) is a screening tool to identify possible weight problems. It provides an estimate of body fat based on height and weight. Your health care provider can find your BMI and can help you achieve or maintain a healthy weight.   For adults 20 years and older: ? A BMI below 18.5 is considered underweight. ? A BMI of 18.5 to 24.9 is normal. ? A BMI of 25 to 29.9 is considered overweight. ? A BMI of 30 and above is considered obese.   . Maintain normal blood lipids and cholesterol levels by exercising and minimizing your intake of saturated fat. Eat a balanced diet with plenty of fruit and vegetables. Blood tests for lipids and cholesterol should begin at age 60 and be repeated every 5 years. If your lipid or cholesterol levels are high, you are over 50, or you are at high risk for heart  disease, you may need your cholesterol levels checked more frequently. Ongoing high lipid and cholesterol levels should be treated with medicines if diet and exercise are not working.  . If you smoke, find out from your health care provider how to quit. If you do not use tobacco, please do not start.  . If you choose to drink alcohol, please do not consume more than 2 drinks per day. One drink is considered to be 12 ounces (355 mL) of beer, 5 ounces (148 mL) of wine, or 1.5 ounces (44 mL) of liquor.  . If you are 52-23 years old, ask your health care provider if you  should take aspirin to prevent strokes.  . Use sunscreen. Apply sunscreen liberally and repeatedly throughout the day. You should seek shade when your shadow is shorter than you. Protect yourself by wearing long sleeves, pants, a wide-brimmed hat, and sunglasses year round, whenever you are outdoors.  . Once a month, do a whole body skin exam, using a mirror to look at the skin on your back. Tell your health care provider of new moles, moles that have irregular borders, moles that are larger than a pencil eraser, or moles that have changed in shape or color.

## 2017-01-30 NOTE — Progress Notes (Signed)
Pre visit review using our clinic review tool, if applicable. No additional management support is needed unless otherwise documented below in the visit note. 

## 2017-01-30 NOTE — Progress Notes (Signed)
PCP notes:   Health maintenance:  No gaps identified.   Abnormal screenings:   Depression score: 10 Hearing - failed  Hearing Screening   125Hz  250Hz  500Hz  1000Hz  2000Hz  3000Hz  4000Hz  6000Hz  8000Hz   Right ear:   40 40 40  0    Left ear:   0 0 40  0     Patient concerns:   Patient has complaints of dizziness, shortness of breath, fatigue, and bilateral knee pain. Patient was encouraged to increase fluid intake, mostly water, to reduce risk of dehydration.   Nurse concerns:  Initial BP was 82/64. PCP notified. Pt was advised to hold Torsemide on 10/23. On 10/24, pt advised to contact PCP with blood pressure, pulse, and weight data before taking Torsemide.   Next PCP appt:   02/06/17 @ 1215  I reviewed health advisor's note, was available for consultation on the day of service listed in this note, and agree with documentation and plan. Crawford Givens, MD.

## 2017-01-30 NOTE — Progress Notes (Signed)
Subjective:   [name] is a 83 y.o. female who presents for Medicare Annual (Subsequent) preventive examination.  Review of Systems:  N/A Cardiac Risk Factors include: advanced age (>98men, >22 women)     Objective:     Vitals: BP 92/70 (BP Location: Right Arm, Patient Position: Sitting, Cuff Size: Normal)   Pulse 91   Temp 97.7 F (36.5 C) (Oral)   Ht 5' 4.75" (1.645 m) Comment: shoes  Wt 165 lb 4 oz (75 kg)   SpO2 97%   BMI 27.71 kg/m   Body mass index is 27.71 kg/m.   Tobacco History  Smoking Status  . Never Smoker  Smokeless Tobacco  . Never Used     Counseling given: No   Past Medical History:  Diagnosis Date  . Arthritis    "knees" (06/18/2015)  . Atrial fibrillation (HCC)    h/o sig bleed on coumadin  . Atrial fibrillation with RVR (HCC) 06/18/2015  . CHF (congestive heart failure) (HCC)   . Complication of anesthesia 2010   "w/cardiac cath; got into a psychotic state for ~ 3 days; got me out w/Valium"  . Depression    "I have it off and on; not as often as I've gotten older" (06/18/2015)  . Diverticulosis    descending and sigmoid colon--Dr. Jarold Motto  . GERD (gastroesophageal reflux disease)   . Insomnia     off chronic ambien 5mg  as of 10/11, rare/episodic use since then  DCM/CHF  . Osteoporosis    on DXA 05/2010  . Sciatica    Right   Past Surgical History:  Procedure Laterality Date  . CARDIAC CATHETERIZATION  2010  . CARDIOVERSION N/A 06/19/2015   Procedure: CARDIOVERSION;  Surgeon: Laurey Morale, MD;  Location: [name] ENDOSCOPY;  Service: Cardiovascular;  Laterality: N/A;  . CATARACT EXTRACTION W/ INTRAOCULAR LENS  IMPLANT, BILATERAL Bilateral   . DILATION AND CURETTAGE OF UTERUS    . TEE WITHOUT CARDIOVERSION N/A 06/19/2015   Procedure: TRANSESOPHAGEAL ECHOCARDIOGRAM (TEE);  Surgeon: Laurey Morale, MD;  Location: [facility] ENDOSCOPY;  Service: Cardiovascular;  Laterality: N/A;  . VAGINAL HYSTERECTOMY  1979   ovaries intact   Family History    Problem Relation Age of Onset  . Cancer Father   . Colon cancer Father        possible colon cancer  . Tuberculosis Mother   . Breast cancer Neg Hx    History  Sexual Activity  . Sexual activity: No    Outpatient Encounter Prescriptions as of 01/30/2017  Medication Sig  . acetaminophen (TYLENOL) 650 MG CR tablet Take 650 mg by mouth every 8 (eight) hours as needed for pain.  [name] apixaban (ELIQUIS) 2.5 MG TABS tablet Take 1 tablet (2.5 mg total) by mouth 2 (two) times daily.  . B Complex-C (SUPER B COMPLEX PO) Take 1 capsule by mouth daily.  . Biotin 1000 MCG tablet Take 1,000 mcg by mouth 3 (three) times daily.  . Cholecalciferol (VITAMIN D PO) Take 1 capsule by mouth daily.   [name] diltiazem (CARDIZEM CD) 120 MG 24 hr capsule Take 1 capsule (120 mg total) by mouth daily.  . metoprolol succinate (TOPROL-XL) 25 MG 24 hr tablet Take 1 tablet (25 mg total) by mouth daily.  . ranitidine (ZANTAC) 150 MG tablet Take 75 mg by mouth 2 (two) times daily as needed for heartburn.  . sertraline (ZOLOFT) 50 MG tablet Take 2 tablets (100 mg total) by mouth daily.  . simethicone (MYLICON) 80 MG  chewable tablet Chew 1 tablet (80 mg total) by mouth daily as needed.  . torsemide (DEMADEX) 20 MG tablet Take 1 tablet (20 mg total) by mouth daily. PT TAKES AN EXTRA 20 MG BY MOUTH A DAY IF WEIGHT > THEN 2 POUNDS.  [name]. traMADol (ULTRAM) 50 MG tablet Take 50 mg by mouth as needed.  . [DISCONTINUED] diltiazem (CARDIZEM CD) 120 MG 24 hr capsule Take 1 capsule (120 mg total) by mouth daily.  . [DISCONTINUED] metoprolol succinate (TOPROL XL) 25 MG 24 hr tablet Take 1 tablet (25 mg total) by mouth daily.   No facility-administered encounter medications on file as of 01/30/2017.     Activities of Daily Living In your present state of health, do you have any difficulty performing the following activities: 01/30/2017  Hearing? N  Vision? N  Difficulty concentrating or making decisions? N  Walking or climbing stairs?  Y  Dressing or bathing? N  Doing errands, shopping? N  Preparing Food and eating ? N  Using the Toilet? N  In the past six months, have you accidently leaked urine? N  Do you have problems with loss of bowel control? N  Managing your Medications? N  Managing your Finances? N  Housekeeping or managing your Housekeeping? N  Some recent data might be hidden    Patient Care Team: Joaquim Namuncan, Graham S, MD as PCP - Lorelle GibbsGeneral Stoneburner, Sara, MD as Consulting Physician (Ophthalmology) Wendall StadeNishan, Peter C, MD as Consulting Physician (Cardiology) Breck Coonsaniel, Harris, DDS as Referring Physician (Dentistry) Marcene Corningalldorf, Peter, MD as Consulting Physician (Orthopedic Surgery)    Assessment:      Hearing Screening   125Hz  250Hz  500Hz  1000Hz  2000Hz  3000Hz  4000Hz  6000Hz  8000Hz   Right ear:   40 40 40  0    Left ear:   0 0 40  0    Vision Screening Comments: Last vision exam in Feb 2018 with Dr. Jaynie BreamStoneburner/[facility]   Exercise Activities and Dietary recommendations Current Exercise Habits: The patient does not participate in regular exercise at present, Exercise limited by: None identified  Goals    . Increase water intake          Starting 01/30/2017, I will attempt to drink at least one glass of water with each meal.       Fall Risk Fall Risk  01/30/2017 12/30/2015 10/23/2014 08/26/2013 03/27/2012  Falls in the past year? No Yes No Yes No  Comment - accidental fall while walking in backyard - - -  Number falls in past yr: - 1 - 1 -  Injury with Fall? - No - - -  Follow up - Falls evaluation completed - - -   Depression Screen PHQ 2/9 Scores 01/30/2017 12/30/2015 10/23/2014 08/26/2013  PHQ - 2 Score 5 0 0 2  PHQ- 9 Score 10 - - -     Cognitive Function MMSE - Mini Mental State Exam 01/30/2017 12/30/2015  Orientation to time 5 5  Orientation to Place 5 5  Registration 3 3  Attention/ Calculation 0 0  Recall 3 3  Language- name 2 objects 0 0  Language- repeat 1 1  Language- follow 3 step  command 3 3  Language- read & follow direction 0 0  Write a sentence 0 0  Copy design 0 0  Total score 20 20        Immunization History  Administered Date(s) Administered  . Influenza Whole 12/10/2009, 01/01/2012  . Influenza,inj,Quad PF,6+ Mos 12/30/2013, 01/08/2015, 12/30/2015, 12/30/2016  . Pneumococcal Conjugate-13  10/23/2014  . Pneumococcal Polysaccharide-23 04/11/2006  . Td 05/13/2010  . Zoster 04/11/2006   Screening Tests Health Maintenance  Topic Date Due  . DEXA SCAN  12/29/2025 (Originally 02/12/1998)  . TETANUS/TDAP  05/13/2020  . INFLUENZA VACCINE  Completed  . PNA vac Low Risk Adult  Completed      Plan:     I have personally reviewed, addressed, and noted the following in the patient's chart:  A. Medical and social history B. Use of alcohol, tobacco or illicit drugs  C. Current medications and supplements D. Functional ability and status E.  Nutritional status F.  Physical activity G. Advance directives H. List of other physicians I.  Hospitalizations, surgeries, and ER visits in previous 12 months J.  Vitals K. Screenings to include hearing, vision, cognitive, depression L. Referrals and appointments - none  In addition, I have reviewed and discussed with patient certain preventive protocols, quality metrics, and best practice recommendations. A written personalized care plan for preventive services as well as general preventive health recommendations were provided to patient.  See attached scanned questionnaire for additional information.   Signed,   Randa Evens, MHA, BS, LPN Health Coach

## 2017-02-01 ENCOUNTER — Encounter: Payer: Self-pay | Admitting: Family Medicine

## 2017-02-06 ENCOUNTER — Encounter: Payer: Self-pay | Admitting: Family Medicine

## 2017-02-06 ENCOUNTER — Ambulatory Visit (INDEPENDENT_AMBULATORY_CARE_PROVIDER_SITE_OTHER): Payer: Medicare Other | Admitting: Family Medicine

## 2017-02-06 VITALS — BP 92/70 | HR 91 | Temp 97.7°F | Ht 64.75 in | Wt 165.2 lb

## 2017-02-06 DIAGNOSIS — Z0000 Encounter for general adult medical examination without abnormal findings: Secondary | ICD-10-CM

## 2017-02-06 DIAGNOSIS — F339 Major depressive disorder, recurrent, unspecified: Secondary | ICD-10-CM

## 2017-02-06 DIAGNOSIS — I482 Chronic atrial fibrillation, unspecified: Secondary | ICD-10-CM

## 2017-02-06 DIAGNOSIS — E559 Vitamin D deficiency, unspecified: Secondary | ICD-10-CM

## 2017-02-06 DIAGNOSIS — Z7189 Other specified counseling: Secondary | ICD-10-CM

## 2017-02-06 DIAGNOSIS — M25569 Pain in unspecified knee: Secondary | ICD-10-CM

## 2017-02-06 NOTE — Progress Notes (Signed)
[name].  No CP.  She was taking 1/2 tab of torsemide but had another 1 lbs weight gain, now with the plan to go back to 1 tab a day.  D/w pt.  She is getting more with exertion, fatigued, and lightheaded.  Her SBP is variable from ~80 up to ~100.  Her pulse is usually ~100.  Still on anticoagulation.    Sertaline use d/w pt.  Her mood is lower.  She is taking 75mg  a day.  She didn't feel "right" on 100mg  a day.  She has been on SSRI for extended period of time. No SI/HI.  Her mood has been worse in the last year.  Her functional status is likely affecting her mood.    Hearing screen prev failed, declined hearing aids, d/w pt.    Vaccination up to date.  Defer colonoscopy given her age.    Mammogram up to date.   If patient were incapacitated she would have her husband designated. Then she would have her son/daughter equally designated thereafter if needed.  Knee pain.  Taking tramadol rarely- stopped med now.  She hallucinated with med use.  Med list updated and allergy list updated.  She is going to f/u with ortho and take tylenol in the meantime.  She hasn't hallucinated otherwise.  Prev knee injection helped some.  She has more pain with walking, but not pain with sitting.    Vit d def- she just recently restarted her vit D, so that likely affected her low level, dw pt about restarting med.    PMH and SH reviewed  ROS: Per HPI unless specifically indicated in ROS section   Meds, vitals, and allergies reviewed.   GEN: nad, alert and oriented HEENT: mucous membranes moist NECK: supple w/o LA CV: IRR.   PULM: ctab, no inc wob ABD: soft, +bs EXT: no edema SKIN: no acute rash

## 2017-02-06 NOTE — Patient Instructions (Addendum)
Take care.  Glad to see you.  Let me talk to cardiology.  Restart vitamin D in the meantime.

## 2017-02-08 DIAGNOSIS — E559 Vitamin D deficiency, unspecified: Secondary | ICD-10-CM | POA: Insufficient documentation

## 2017-02-08 DIAGNOSIS — Z0000 Encounter for general adult medical examination without abnormal findings: Secondary | ICD-10-CM | POA: Insufficient documentation

## 2017-02-08 NOTE — Assessment & Plan Note (Signed)
Med list updated and allergy list updated.  She is going to f/u with ortho and take tylenol in the meantime.  She hasn't hallucinated otherwise, as she did with tramadol.  Prev knee injection helped some.  She has more pain with walking, but not pain with sitting.   I'll await input from ortho.

## 2017-02-08 NOTE — Assessment & Plan Note (Signed)
I will discuss with cardiology about options. Unclear if she would be better off if not on metoprolol. Unclear if metoprolol is affecting her mood. Given her weight gain on lower dose of torsemide, I would continue 1 tablet torsemide okay. She agrees. Okay for outpatient follow-up. >25 minutes spent in face to face time with patient, >50% spent in counselling or coordination of care.

## 2017-02-08 NOTE — Assessment & Plan Note (Signed)
If patient were incapacitated she would have her husband designated. Then she would have her son/daughter equally designated thereafter if needed.

## 2017-02-08 NOTE — Assessment & Plan Note (Signed)
Sertaline use d/w pt.  Her mood is lower.  She is taking 75mg  a day.  She didn't feel "right" on 100mg  a day.  She has been on SSRI for extended period of time. No SI/HI.  Her mood has been worse in the last year.  Her functional status is likely affecting her mood.  Unclear how much beta blocker is affecting her mood. See A. fib discussion.

## 2017-02-08 NOTE — Assessment & Plan Note (Signed)
She just recently restarted her vit D, so that likely affected her low level, dw pt about continuing med.

## 2017-02-08 NOTE — Assessment & Plan Note (Signed)
Hearing screen prev failed, declined hearing aids, d/w pt.   Vaccination up to date.  Defer colonoscopy given her age.    Mammogram up to date.

## 2017-02-13 ENCOUNTER — Telehealth: Payer: Self-pay | Admitting: Family Medicine

## 2017-02-13 ENCOUNTER — Encounter: Payer: Self-pay | Admitting: Family Medicine

## 2017-02-13 NOTE — Telephone Encounter (Signed)
Patient advised and refuses psych referral.  Patient says she has had depression off and on all her life and does not want to be medicated because she doesn't feel it is that bad.  Patient also states she is taking the Toprol 25 mg 0.5 tablet per day and has felt better the last few days.

## 2017-02-13 NOTE — Telephone Encounter (Signed)
Notify pt.  I talked with cardiology.  Prev dec in toprol didn't have sig effect.  I think it is reasonable to have patient talk with psych about med options re: depression. Please encourage her to go.  Let me know if she needs a referral.

## 2017-02-14 NOTE — Telephone Encounter (Signed)
Noted.  I will defer to patient.  She was not actively suicidal or homicidal, had no suicidal or homicidal intent, so we cannot require further intervention.  Thanks.

## 2017-02-25 ENCOUNTER — Encounter: Payer: Self-pay | Admitting: Family Medicine

## 2017-02-26 ENCOUNTER — Encounter: Payer: Self-pay | Admitting: Family Medicine

## 2017-03-01 ENCOUNTER — Encounter: Payer: Self-pay | Admitting: Cardiovascular Disease

## 2017-03-09 NOTE — Progress Notes (Signed)
Patient ID: [name], female   DOB: 11-13-1930, 83 y.o.   MRN: [id number]   03/13/2017 [name]   02-03-1931  [id number]  Primary Physician [name], MD Primary Cardiologist: Dr. Eden Eaton   Reason for Visit:  Atrial Fibrillation    HPI:  83 y.o.  female with a hx of PAF, NICM, systolic HF, LBBB, prior spontaneous retroperitoneal bleed on Coumadin, admtted 06/18/15 through the ED with weakness and dizziness and found to have AF with RVR. She was admitted for rate control and set up for TEE CV 06/19/15. She had TEE CV 06/19/15 then had a right rectus sheath hematoma while on Heparin and anticoagulation was stopped. She was seen in consult by Dr Luisa Hart who did not feel surgery was indicated. She did not require transfusion. She then reverted back to AF. After discussion  the plan will be for low dose Eliquis and rate control.   Seen by PA March Afib on EKG but rate is controlled in the 70s. She reports full compliance with her rate control agents and with Eliquis. She denies any abnormal bleeding. Her main complaint is constant fatigue.Significant mood disorder Has had clinical depression with suicidal ideation in past and has seen counselor. Started on Zoloft with some benefit  Digoxen and Toprol stopped 08/11/15 to see if fatigue improved not helpful   06/19/15 Study Conclusions  - Left ventricle: The cavity size was normal. Wall thickness was  normal. The estimated ejection fraction was 50%. Diffuse  hypokinesis. - Aortic valve: There was no stenosis. There was trivial  regurgitation. - Aorta: Normal caliber thoracic aorta with minimal plaque. - Mitral valve: There was trivial regurgitation. - Left atrium: The atrium was moderately to severely dilated. No  evidence of thrombus in the atrial cavity or appendage. - Right ventricle: The cavity size was normal. Systolic function  was normal. - Right atrium: The atrium was moderately to severely dilated. - Atrial septum:  No defect or patent foramen ovale was identified. - Tricuspid valve: There was moderate regurgitation. Peak RV-RA  gradient (S): 24 mm Hg.  She cut her Toprol back on her own and HR is up. She needs left TKR with Dr Margreta Journey Should be fine For this may need iv cardizem for rate control post op   Lab Results  Component Value Date   HCT 37.5 09/27/2016     Current Outpatient Medications  Medication Sig Dispense Refill  . acetaminophen (TYLENOL) 650 MG CR tablet Take 650 mg by mouth every 8 (eight) hours as needed for pain.    [name] apixaban (ELIQUIS) 2.5 MG TABS tablet Take 1 tablet (2.5 mg total) by mouth 2 (two) times daily. 60 tablet 6  . B Complex-C (SUPER B COMPLEX PO) Take 1 capsule by mouth daily.    . Biotin 1000 MCG tablet Take 1,000 mcg by mouth 3 (three) times daily.    . Cholecalciferol (VITAMIN D PO) Take 1 capsule by mouth daily.     [name] diltiazem (CARDIZEM CD) 120 MG 24 hr capsule Take 1 capsule (120 mg total) by mouth daily. 90 capsule 2  . metoprolol succinate (TOPROL-XL) 25 MG 24 hr tablet Take 1 tablet (25 mg total) by mouth daily. 90 tablet 0  . ranitidine (ZANTAC) 150 MG tablet Take 75 mg by mouth 2 (two) times daily as needed for heartburn.    . sertraline (ZOLOFT) 50 MG tablet Take 2 tablets (100 mg total) by mouth daily. (Patient taking differently: Take 100 mg by  mouth daily. 1 TAB AM 1/2 TAB PM per pt) 180 tablet 3  . simethicone (MYLICON) 80 MG chewable tablet Chew 1 tablet (80 mg total) by mouth daily as needed.     No current facility-administered medications for this visit.     Allergies  Allergen Reactions  . Ace Inhibitors     REACTION: cough  . Codeine Other (See Comments)    sedation  . Delsym [Dextromethorphan Polistirex Er] Other (See Comments)    dizziness  . Lactose Intolerance (Gi)     diarrhea  . Sulfamethoxazole-Trimethoprim     REACTION: GI intolerance.  . Tramadol Other (See Comments)    Hallucinations, while on zoloft concurrently.    .  Warfarin Sodium Other (See Comments)    "bleed out"    Social History   Socioeconomic History  . Marital status: Married    Spouse name: Not on file  . Number of children: 2  . Years of education: Not on file  . Highest education level: Not on file  Social Needs  . Financial resource strain: Not on file  . Food insecurity - worry: Not on file  . Food insecurity - inability: Not on file  . Transportation needs - medical: Not on file  . Transportation needs - non-medical: Not on file  Occupational History  . Occupation: Retired    Associate Professormployer: RETIRED  Tobacco Use  . Smoking status: Never Smoker  . Smokeless tobacco: Never Used  Substance and Sexual Activity  . Alcohol use: No    Alcohol/week: 0.0 oz  . Drug use: No  . Sexual activity: No  Other Topics Concern  . Not on file  Social History Narrative   [place]ired, [employer]   Married, 1953   Lives with husband   2 grown children, one in [place], one out of state   Tobacco Use - No.    Drug Use - no   teaches Sunday school, reading     Review of Systems: General: negative for chills, fever, night sweats or weight changes.  Cardiovascular: negative for chest pain, dyspnea on exertion, edema, orthopnea, palpitations, paroxysmal nocturnal dyspnea or shortness of breath Dermatological: negative for rash Respiratory: negative for cough or wheezing Urologic: negative for hematuria Abdominal: negative for nausea, vomiting, diarrhea, bright red blood per rectum, melena, or hematemesis Neurologic: negative for visual changes, syncope, or dizziness All other systems reviewed and are otherwise negative except as noted above.    Blood pressure 108/78, pulse (!) 137, height 5\' 5"  (1.651 m), weight 166 lb 4 oz (75.4 kg), SpO2 97 %.   Affect appropriate Healthy:  appears stated age HEENT: normal Neck supple with no adenopathy JVP normal no bruits no thyromegaly Lungs clear with no wheezing and good diaphragmatic  motion Heart:  S1/S2 no murmur, no rub, gallop or click PMI normal Abdomen: benighn, BS positve, no tenderness, no AAA no bruit.  No HSM or HJR Distal pulses intact with no bruits No edema Neuro non-focal Skin warm and dry No muscular weakness    EKG  07/07/15   atrial fibrillation. 78 bpm 08/03/16  afib rate 98 LAD ICLBBB   ASSESSMENT AND PLAN:   [facility] She is unaware that she is in it rate control and anticoagulation strategy Toprol Works best for rate control No bleeding on low dose eliquis told her to resume 25 mg daily Would hold eliquis for 2 days prior to surgery   Anemia  Stable f/u primary  Lab  Results  Component Value Date   HCT 37.5 09/27/2016     Anticoagulation: on low dose eliquis given age, low body weight and rectus sheath hematoma  LBBB stable no high grade heart block ECG q 6 months   Depression: I think this is the main issue regarding fatigue.  On Zoloft f/u primary   Ortho: clear to have general anesthesia would hold eliquis only 2 days before surgery Rate control toprol and cardizem    Charlton Haws

## 2017-03-13 ENCOUNTER — Ambulatory Visit: Payer: Medicare Other | Admitting: Cardiovascular Disease

## 2017-03-13 ENCOUNTER — Encounter: Payer: Self-pay | Admitting: Cardiovascular Disease

## 2017-03-13 VITALS — BP 108/78 | HR 137 | Ht 65.0 in | Wt 166.2 lb

## 2017-03-13 DIAGNOSIS — I482 Chronic atrial fibrillation, unspecified: Secondary | ICD-10-CM

## 2017-03-13 DIAGNOSIS — I447 Left bundle-branch block, unspecified: Secondary | ICD-10-CM

## 2017-03-13 NOTE — Patient Instructions (Addendum)
Medication Instructions:  Your physician recommends that you continue on your current medications as directed. Please refer to the Current Medication list given to you today.  Labwork: NONE  Testing/Procedures: NONE  Follow-Up: Your physician wants you to follow-up in: 3 months with Dr. Nishan.   If you need a refill on your cardiac medications before your next appointment, please call your pharmacy.    

## 2017-04-09 ENCOUNTER — Encounter: Payer: Self-pay | Admitting: Family Medicine

## 2017-04-12 ENCOUNTER — Other Ambulatory Visit: Payer: Self-pay | Admitting: Orthopaedic Surgery

## 2017-04-13 ENCOUNTER — Ambulatory Visit: Payer: Medicare Other | Admitting: Family Medicine

## 2017-04-13 ENCOUNTER — Encounter: Payer: Self-pay | Admitting: Family Medicine

## 2017-04-13 DIAGNOSIS — R059 Cough, unspecified: Secondary | ICD-10-CM

## 2017-04-13 DIAGNOSIS — R05 Cough: Secondary | ICD-10-CM | POA: Diagnosis not present

## 2017-04-13 MED ORDER — SERTRALINE HCL 50 MG PO TABS: ORAL_TABLET | ORAL | Status: DC

## 2017-04-13 MED ORDER — TORSEMIDE 20 MG PO TABS: 20.0000 mg | ORAL_TABLET | Freq: Every day | ORAL | Status: DC

## 2017-04-13 NOTE — Progress Notes (Signed)
Cough and congestion.  Started around the day after xmas.  Better in the meantime.  Had clear rhinorrhea at the time but that is better.  She had been using OTC nasal spray and that may have increased nasal congestion paradoxically.  She had some episodic increase in pulse rate, unclear if that was related to the nasal spray.    She is set up for knee surgery in the meantime.  She is hopeful that will improve her situation.    She is down 3 lbs today on her home scales after a higher dose of torsemide yesterday.    Meds, vitals, and allergies reviewed.   ROS: Per HPI unless specifically indicated in ROS section   GEN: nad, alert and oriented HEENT: mucous membranes moist, TM wnl, nasal exam minimally stuffy, OP wnl NECK: supple w/o LA CV: IRR, not tachy PULM: ctab, no inc wob ABD: soft, +bs EXT: no edema

## 2017-04-13 NOTE — Patient Instructions (Signed)
Check to see what nasal spray you used- I wouldn't use it again.  Your lungs sound good.  I am hopeful that the knee surgery will improve your situation in multiple ways.  Take care.  Glad to see you.  Update me as needed.

## 2017-04-14 NOTE — Assessment & Plan Note (Signed)
Cough is improved.  She likely had a benign self-limited viral process.  Lungs are clear.  Okay for outpatient follow-up.  I do not think this is going to interfere with her pending surgery.  My hope is that getting her knee surgery done will improve her mobility and that this will help improve her mood.  She agrees.  Update me as needed.

## 2017-04-18 NOTE — Pre-Procedure Instructions (Signed)
Makita Blow Haffey  04/18/2017      [facility] - [place], [place] - [id number], [address] [place] [address] Phone: [phone] Fax: [phone]    Your procedure is scheduled on April 25, 2017.  Report to [facility] Admitting at 935 AM.  Call this number if you have problems the morning of surgery:  [phone]   Remember:  Do not eat food or drink liquids after midnight.  Take these medicines the morning of surgery with A SIP OF WATER acetaminophen (tylenol)-if needed, eye drops-if needed, diltaizem (cardizem), metoprolol succinate (toprol-XL), ranitidine (zantac), sertraline (zoloft).  Stop Eliquis as instructed by your surgeon  7 days prior to surgery STOP taking any Aspirin (unless otherwise instructed by your surgeon), Aleve, Naproxen, Ibuprofen, Motrin, Advil, Goody's, BC's, all herbal medications, fish oil, and all vitamins  Continue all other medications as instructed by your physician except follow the above medication instructions before surgery   Do not wear jewelry, make-up or nail polish.  Do not wear lotions, powders, or perfumes, or deodorant.  Do not shave 48 hours prior to surgery.   Do not bring valuables to the hospital.  [facility] is not responsible for any belongings or valuables.  Contacts, dentures or bridgework may not be worn into surgery.  Leave your suitcase in the car.  After surgery it may be brought to your room.  For patients admitted to the hospital, discharge time will be determined by your treatment team.  Patients discharged the day of surgery will not be allowed to drive home.   Special instructions:   [facility]- Preparing For Surgery  Before surgery, you can play an important role. Because skin is not sterile, your skin needs to be as free of germs as possible. You can reduce the number of germs on your skin by washing with CHG (chlorahexidine gluconate) Soap before surgery.   CHG is an antiseptic cleaner which kills germs and bonds with the skin to continue killing germs even after washing.  Please do not use if you have an allergy to CHG or antibacterial soaps. If your skin becomes reddened/irritated stop using the CHG.  Do not shave (including legs and underarms) for at least 48 hours prior to first CHG shower. It is OK to shave your face.  Please follow these instructions carefully.   1. Shower the NIGHT BEFORE SURGERY and the MORNING OF SURGERY with CHG.   2. If you chose to wash your hair, wash your hair first as usual with your normal shampoo.  3. After you shampoo, rinse your hair and body thoroughly to remove the shampoo.  4. Use CHG as you would any other liquid soap. You can apply CHG directly to the skin and wash gently with a scrungie or a clean washcloth.   5. Apply the CHG Soap to your body ONLY FROM THE NECK DOWN.  Do not use on open wounds or open sores. Avoid contact with your eyes, ears, mouth and genitals (private parts). Wash Face and genitals (private parts)  with your normal soap.  6. Wash thoroughly, paying special attention to the area where your surgery will be performed.  7. Thoroughly rinse your body with warm water from the neck down.  8. DO NOT shower/wash with your normal soap after using and rinsing off the CHG Soap.  9. Pat yourself dry with a CLEAN TOWEL.  10. Wear CLEAN PAJAMAS to bed the night  before surgery, wear comfortable clothes the morning of surgery  11. Place CLEAN SHEETS on your bed the night of your first shower and DO NOT SLEEP WITH PETS.  Day of Surgery: Do not apply any deodorants/lotions. Please wear clean clothes to the hospital/surgery center.    Please read over the following fact sheets that you were given.

## 2017-04-19 ENCOUNTER — Ambulatory Visit (HOSPITAL_COMMUNITY)
Admission: RE | Admit: 2017-04-19 | Discharge: 2017-04-19 | Disposition: A | Discharge status: Home / Self Care | Payer: Medicare Other | Source: Ambulatory Visit | Attending: Orthopaedic Surgery | Admitting: Orthopaedic Surgery

## 2017-04-19 ENCOUNTER — Encounter (HOSPITAL_COMMUNITY)
Admission: RE | Admit: 2017-04-19 | Discharge: 2017-04-19 | Disposition: A | Discharge status: Home / Self Care | Payer: Medicare Other | Source: Ambulatory Visit | Attending: Orthopaedic Surgery | Admitting: Orthopaedic Surgery

## 2017-04-19 ENCOUNTER — Encounter (HOSPITAL_COMMUNITY): Payer: Self-pay

## 2017-04-19 ENCOUNTER — Encounter: Payer: Self-pay | Admitting: Cardiovascular Disease

## 2017-04-19 ENCOUNTER — Other Ambulatory Visit: Payer: Self-pay

## 2017-04-19 DIAGNOSIS — R918 Other nonspecific abnormal finding of lung field: Secondary | ICD-10-CM | POA: Insufficient documentation

## 2017-04-19 DIAGNOSIS — I4891 Unspecified atrial fibrillation: Secondary | ICD-10-CM | POA: Insufficient documentation

## 2017-04-19 DIAGNOSIS — Z01818 Encounter for other preprocedural examination: Secondary | ICD-10-CM | POA: Diagnosis present

## 2017-04-19 HISTORY — DX: Unilateral primary osteoarthritis, unspecified knee: M17.10

## 2017-04-19 HISTORY — DX: Cardiac arrhythmia, unspecified: I49.9

## 2017-04-19 HISTORY — DX: Other cardiomyopathies: I42.8

## 2017-04-19 HISTORY — DX: Anxiety disorder, unspecified: F41.9

## 2017-04-19 HISTORY — DX: Osteoarthritis of knee, unspecified: M17.9

## 2017-04-19 LAB — URINALYSIS, ROUTINE W REFLEX MICROSCOPIC
Bacteria, UA: NONE SEEN
Bilirubin Urine: NEGATIVE
Glucose, UA: NEGATIVE mg/dL
Hgb urine dipstick: NEGATIVE
Ketones, ur: NEGATIVE mg/dL
Leukocytes, UA: NEGATIVE
Nitrite: NEGATIVE
Protein, ur: NEGATIVE mg/dL
RBC / HPF: NONE SEEN RBC/hpf (ref 0–5)
Specific Gravity, Urine: 1.006 (ref 1.005–1.030)
WBC, UA: NONE SEEN WBC/hpf (ref 0–5)
pH: 6 (ref 5.0–8.0)

## 2017-04-19 LAB — CBC WITH DIFFERENTIAL/PLATELET
Basophils Absolute: 0 10*3/uL (ref 0.0–0.1)
Basophils Relative: 1 %
Eosinophils Absolute: 0.1 10*3/uL (ref 0.0–0.7)
Eosinophils Relative: 1 %
HCT: 39.2 % (ref 36.0–46.0)
Hemoglobin: 12.4 g/dL (ref 12.0–15.0)
Lymphocytes Relative: 22 %
Lymphs Abs: 1.8 10*3/uL (ref 0.7–4.0)
MCH: 30.1 pg (ref 26.0–34.0)
MCHC: 31.6 g/dL (ref 30.0–36.0)
MCV: 95.1 fL (ref 78.0–100.0)
Monocytes Absolute: 0.5 10*3/uL (ref 0.1–1.0)
Monocytes Relative: 6 %
Neutro Abs: 5.8 10*3/uL (ref 1.7–7.7)
Neutrophils Relative %: 70 %
Platelets: 276 10*3/uL (ref 150–400)
RBC: 4.12 MIL/uL (ref 3.87–5.11)
RDW: 13.7 % (ref 11.5–15.5)
WBC: 8.2 10*3/uL (ref 4.0–10.5)

## 2017-04-19 LAB — TYPE AND SCREEN
ABO/RH(D): O NEG
Antibody Screen: NEGATIVE

## 2017-04-19 LAB — BASIC METABOLIC PANEL
Anion gap: 10 (ref 5–15)
BUN: 21 mg/dL — ABNORMAL HIGH (ref 6–20)
CO2: 29 mmol/L (ref 22–32)
Calcium: 9.2 mg/dL (ref 8.9–10.3)
Chloride: 97 mmol/L — ABNORMAL LOW (ref 101–111)
Creatinine, Ser: 1.13 mg/dL — ABNORMAL HIGH (ref 0.44–1.00)
GFR calc Af Amer: 50 mL/min — ABNORMAL LOW (ref >60)
GFR calc non Af Amer: 43 mL/min — ABNORMAL LOW (ref >60)
Glucose, Bld: 96 mg/dL (ref 65–99)
Potassium: 3.2 mmol/L — ABNORMAL LOW (ref 3.5–5.1)
Sodium: 136 mmol/L (ref 135–145)

## 2017-04-19 LAB — APTT: aPTT: 35 seconds (ref 24–36)

## 2017-04-19 LAB — SURGICAL PCR SCREEN
MRSA, PCR: NEGATIVE
Staphylococcus aureus: NEGATIVE

## 2017-04-19 LAB — PROTIME-INR
INR: 1.19
Prothrombin Time: 15 seconds (ref 11.4–15.2)

## 2017-04-19 NOTE — Progress Notes (Signed)
PCP - Dr. Crawford Givens  Cardiologist - [name]  Chest x-ray - 04/19/17  EKG - 08/03/16 (E)  Stress Test - Denies  ECHO - 06/19/15 (E)  Cardiac Cath - 2010, No stents  Sleep Study - No CPAP - None  LABS- 04/19/17: CBC, BMP, PT, PTT, UA, T/S  Pt sts her last dose of Eliquis will be 04/21/17, per her doctor.  Anesthesia- Yes- Cardiac history  Pt denies having chest pain, sob, or fever at this time. All instructions explained to the pt, with a verbal understanding of the material. Pt agrees to go over the instructions while at home for a better understanding. The opportunity to ask questions was provided.

## 2017-04-20 NOTE — Progress Notes (Signed)
Anesthesia Chart Review: Patient is a 84 year old female scheduled for left TKA on 04/25/17 by Dr. [name].  History includes non-smoker, afib 04/2008 and 06/2015 s/p DCCV 06/19/15 with recurrent afib (right rectus sheath hematoma while on heparin 06/2015; retroperitoneal bleed on warfarin 05/2008), systolic CHF, non-ischemic cardiomyopathy, GERD, anxiety, depression, hysterectomy. Reports psychosis for ~ 3 days after sedation for cardiac cath 04/2008.  PCP is Dr. Crawford Givens. Last visit 04/14/17. Cardiologist is Dr. Charlton Haws. Last visit 03/13/17. He felt she "should be fine" to undergo left TKA, but thought she may end up needing IV Cardizem for afib rate control post-op.  Meds include Eliquis (last dose 04/21/17), Cardizem CD, Toprol-XL, Zantac, Zoloft, torsemide.   BP 109/72   Pulse 92   Temp 36.5 C   Resp 18   Ht 5\' 5"  (1.651 m)   Wt 168 lb (76.2 kg)   SpO2 98%   BMI 27.96 kg/m   EKG 08/03/16: Afib, LAD, non-specific intraventricular block (LBBB). Known left BBB history.   Echo 01/23/14:Study Conclusions - Left ventricle: The cavity size was normal. Wall thickness was normal. Systolic function was normal. The estimated ejection fraction was in the range of 50% to 55%. Wall motion was normal; there were no regional wall motion abnormalities. Doppler parameters are consistent with abnormal left ventricular relaxation (grade 1 diastolic dysfunction). - Ventricular septum: Septal motion showed abnormal function and dyssynergy. - Aortic valve: There was mild regurgitation. Impressions: - Low normal LV function; grade 1 diastolic dysfunction; mild AI. Compared to 08/09/11, LV function is most likely similar.   Cardiac cath 05/05/08:  IMPRESSION: 1. No angiographic evidence of coronary artery disease. 2. Nonischemic cardiomyopathy. 3. Global left ventricular systolic dysfunction. LVEF 15-20%, RECOMMENDATIONS:  Continued medical management.    CXR 04/19/17:  IMPRESSION: - Hyperinflated lungs with chronic bronchitic and interstitial changes question fibrosis. -Enlargement of cardiac silhouette with slight pulmonary vascular congestion. - No definite acute abnormalities.  Preoperative labs noted. K 3.2. BUN/Cr 21/1.13. CBC, PT/PTT WNL.Glucose 96. T&S done. UA negative for leukocytes and nitrites.   If no acute changes then I would anticipate that she can proceed as planned.  [name] [facility] Short Stay Center/Anesthesiology Phone [phone] 04/21/2017 6:23 AM

## 2017-04-21 ENCOUNTER — Encounter (HOSPITAL_COMMUNITY): Payer: Self-pay

## 2017-04-24 ENCOUNTER — Other Ambulatory Visit: Payer: Self-pay | Admitting: Orthopaedic Surgery

## 2017-04-24 MED ORDER — SODIUM CHLORIDE 0.9 % IV SOLN
2000.0000 mg | INTRAVENOUS | Status: AC
  Administered 2017-04-25: 2000 mg via TOPICAL
  Filled 2017-04-24: qty 20

## 2017-04-24 MED ORDER — LACTATED RINGERS IV SOLN
INTRAVENOUS | Status: DC
  Administered 2017-04-25 (×3): via INTRAVENOUS

## 2017-04-24 MED ORDER — CEFAZOLIN SODIUM-DEXTROSE 2-4 GM/100ML-% IV SOLN
2.0000 g | INTRAVENOUS | Status: AC
  Administered 2017-04-25: 2 g via INTRAVENOUS
  Filled 2017-04-24 (×2): qty 100

## 2017-04-25 ENCOUNTER — Encounter (HOSPITAL_COMMUNITY)
Admission: RE | Disposition: A | Discharge status: Home Health | Payer: Self-pay | Source: Ambulatory Visit | Attending: Orthopaedic Surgery

## 2017-04-25 ENCOUNTER — Other Ambulatory Visit: Payer: Self-pay

## 2017-04-25 ENCOUNTER — Inpatient Hospital Stay (HOSPITAL_COMMUNITY): Payer: Medicare Other | Admitting: Vascular Surgery

## 2017-04-25 ENCOUNTER — Inpatient Hospital Stay (HOSPITAL_COMMUNITY): Payer: Medicare Other | Admitting: Anesthesiology

## 2017-04-25 ENCOUNTER — Inpatient Hospital Stay (HOSPITAL_COMMUNITY)
Admission: RE | Admit: 2017-04-25 | Discharge: 2017-05-06 | DRG: 469 | Disposition: A | Discharge status: Home Health | Payer: Medicare Other | Source: Ambulatory Visit | Attending: Cardiovascular Disease | Admitting: Cardiovascular Disease

## 2017-04-25 DIAGNOSIS — I340 Nonrheumatic mitral (valve) insufficiency: Secondary | ICD-10-CM | POA: Diagnosis not present

## 2017-04-25 DIAGNOSIS — M810 Age-related osteoporosis without current pathological fracture: Secondary | ICD-10-CM | POA: Diagnosis present

## 2017-04-25 DIAGNOSIS — K5900 Constipation, unspecified: Secondary | ICD-10-CM | POA: Diagnosis not present

## 2017-04-25 DIAGNOSIS — M1712 Unilateral primary osteoarthritis, left knee: Secondary | ICD-10-CM | POA: Diagnosis not present

## 2017-04-25 DIAGNOSIS — E876 Hypokalemia: Secondary | ICD-10-CM | POA: Diagnosis not present

## 2017-04-25 DIAGNOSIS — N179 Acute kidney failure, unspecified: Secondary | ICD-10-CM | POA: Diagnosis present

## 2017-04-25 DIAGNOSIS — I5041 Acute combined systolic (congestive) and diastolic (congestive) heart failure: Secondary | ICD-10-CM | POA: Diagnosis not present

## 2017-04-25 DIAGNOSIS — I509 Heart failure, unspecified: Secondary | ICD-10-CM

## 2017-04-25 DIAGNOSIS — I482 Chronic atrial fibrillation: Secondary | ICD-10-CM | POA: Diagnosis present

## 2017-04-25 DIAGNOSIS — E559 Vitamin D deficiency, unspecified: Secondary | ICD-10-CM | POA: Diagnosis present

## 2017-04-25 DIAGNOSIS — I4891 Unspecified atrial fibrillation: Secondary | ICD-10-CM | POA: Diagnosis present

## 2017-04-25 DIAGNOSIS — Z7901 Long term (current) use of anticoagulants: Secondary | ICD-10-CM | POA: Diagnosis not present

## 2017-04-25 DIAGNOSIS — J9600 Acute respiratory failure, unspecified whether with hypoxia or hypercapnia: Secondary | ICD-10-CM | POA: Diagnosis not present

## 2017-04-25 DIAGNOSIS — I480 Paroxysmal atrial fibrillation: Secondary | ICD-10-CM | POA: Diagnosis not present

## 2017-04-25 DIAGNOSIS — F329 Major depressive disorder, single episode, unspecified: Secondary | ICD-10-CM | POA: Diagnosis present

## 2017-04-25 DIAGNOSIS — I081 Rheumatic disorders of both mitral and tricuspid valves: Secondary | ICD-10-CM | POA: Diagnosis present

## 2017-04-25 DIAGNOSIS — I5043 Acute on chronic combined systolic (congestive) and diastolic (congestive) heart failure: Secondary | ICD-10-CM | POA: Diagnosis not present

## 2017-04-25 DIAGNOSIS — R0602 Shortness of breath: Secondary | ICD-10-CM

## 2017-04-25 DIAGNOSIS — K219 Gastro-esophageal reflux disease without esophagitis: Secondary | ICD-10-CM | POA: Diagnosis present

## 2017-04-25 DIAGNOSIS — I447 Left bundle-branch block, unspecified: Secondary | ICD-10-CM | POA: Diagnosis not present

## 2017-04-25 DIAGNOSIS — R739 Hyperglycemia, unspecified: Secondary | ICD-10-CM | POA: Diagnosis present

## 2017-04-25 DIAGNOSIS — J9811 Atelectasis: Secondary | ICD-10-CM | POA: Diagnosis not present

## 2017-04-25 DIAGNOSIS — I5020 Unspecified systolic (congestive) heart failure: Secondary | ICD-10-CM | POA: Diagnosis not present

## 2017-04-25 DIAGNOSIS — M1990 Unspecified osteoarthritis, unspecified site: Secondary | ICD-10-CM | POA: Diagnosis present

## 2017-04-25 DIAGNOSIS — R570 Cardiogenic shock: Secondary | ICD-10-CM

## 2017-04-25 DIAGNOSIS — Z96652 Presence of left artificial knee joint: Secondary | ICD-10-CM

## 2017-04-25 DIAGNOSIS — I361 Nonrheumatic tricuspid (valve) insufficiency: Secondary | ICD-10-CM | POA: Diagnosis not present

## 2017-04-25 HISTORY — DX: Cardiogenic shock: R57.0

## 2017-04-25 HISTORY — PX: TOTAL KNEE ARTHROPLASTY: SHX125

## 2017-04-25 HISTORY — DX: Acute combined systolic (congestive) and diastolic (congestive) heart failure: I50.41

## 2017-04-25 LAB — PROTIME-INR
INR: 1.13
Prothrombin Time: 14.4 seconds (ref 11.4–15.2)

## 2017-04-25 SURGERY — ARTHROPLASTY, KNEE, TOTAL
Anesthesia: General | Site: Knee | Laterality: Left

## 2017-04-25 MED ORDER — LIDOCAINE HCL (CARDIAC) 20 MG/ML IV SOLN
INTRAVENOUS | Status: DC | PRN
  Administered 2017-04-25: 50 mg via INTRAVENOUS

## 2017-04-25 MED ORDER — MENTHOL 3 MG MT LOZG: 1.0000 | LOZENGE | OROMUCOSAL | Status: DC | PRN

## 2017-04-25 MED ORDER — ACETAMINOPHEN 650 MG RE SUPP: 650.0000 mg | RECTAL | Status: DC | PRN

## 2017-04-25 MED ORDER — HYDROCODONE-ACETAMINOPHEN 5-325 MG PO TABS
1.0000 | ORAL_TABLET | ORAL | Status: DC | PRN
  Administered 2017-04-25 – 2017-04-26 (×5): 1 via ORAL
  Filled 2017-04-25 (×3): qty 1

## 2017-04-25 MED ORDER — PROPOFOL 10 MG/ML IV BOLUS
INTRAVENOUS | Status: AC
  Filled 2017-04-25: qty 20

## 2017-04-25 MED ORDER — CHLORHEXIDINE GLUCONATE 4 % EX LIQD: 60.0000 mL | Freq: Once | CUTANEOUS | Status: DC

## 2017-04-25 MED ORDER — DEXTROSE 5 % IV SOLN
10.0000 mg/h | INTRAVENOUS | Status: DC
  Administered 2017-04-25 – 2017-04-26 (×2): 10 mg/h via INTRAVENOUS
  Filled 2017-04-25 (×2): qty 100

## 2017-04-25 MED ORDER — APIXABAN 2.5 MG PO TABS
2.5000 mg | ORAL_TABLET | Freq: Two times a day (BID) | ORAL | Status: DC
  Administered 2017-04-26 – 2017-05-06 (×21): 2.5 mg via ORAL
  Filled 2017-04-25 (×21): qty 1

## 2017-04-25 MED ORDER — MIDAZOLAM HCL 2 MG/2ML IJ SOLN
INTRAMUSCULAR | Status: AC
  Administered 2017-04-25: 0.5 mg via INTRAVENOUS
  Filled 2017-04-25: qty 2

## 2017-04-25 MED ORDER — ACETAMINOPHEN 325 MG PO TABS
650.0000 mg | ORAL_TABLET | ORAL | Status: DC | PRN
  Administered 2017-04-27 – 2017-05-06 (×15): 650 mg via ORAL
  Filled 2017-04-25 (×15): qty 2

## 2017-04-25 MED ORDER — HYDROCODONE-ACETAMINOPHEN 5-325 MG PO TABS
ORAL_TABLET | ORAL | Status: AC
  Filled 2017-04-25: qty 1

## 2017-04-25 MED ORDER — BUPIVACAINE-EPINEPHRINE (PF) 0.5% -1:200000 IJ SOLN
INTRAMUSCULAR | Status: AC
  Filled 2017-04-25: qty 30

## 2017-04-25 MED ORDER — METHOCARBAMOL 1000 MG/10ML IJ SOLN
500.0000 mg | Freq: Four times a day (QID) | INTRAVENOUS | Status: DC | PRN
  Filled 2017-04-25: qty 5

## 2017-04-25 MED ORDER — METOPROLOL SUCCINATE ER 25 MG PO TB24: 25.0000 mg | ORAL_TABLET | Freq: Every day | ORAL | Status: DC

## 2017-04-25 MED ORDER — BUPIVACAINE-EPINEPHRINE (PF) 0.5% -1:200000 IJ SOLN
INTRAMUSCULAR | Status: DC | PRN
  Administered 2017-04-25: 30 mL

## 2017-04-25 MED ORDER — PROPOFOL 500 MG/50ML IV EMUL
INTRAVENOUS | Status: DC | PRN
  Administered 2017-04-25: 25 ug/kg/min via INTRAVENOUS

## 2017-04-25 MED ORDER — METHOCARBAMOL 500 MG PO TABS
500.0000 mg | ORAL_TABLET | Freq: Four times a day (QID) | ORAL | Status: DC | PRN
  Administered 2017-04-26: 500 mg via ORAL
  Filled 2017-04-25: qty 1

## 2017-04-25 MED ORDER — PHENYLEPHRINE HCL 10 MG/ML IJ SOLN
INTRAVENOUS | Status: DC | PRN
  Administered 2017-04-25: 40 ug/min via INTRAVENOUS

## 2017-04-25 MED ORDER — SODIUM CHLORIDE 0.9 % IR SOLN
Status: DC | PRN
  Administered 2017-04-25: 3000 mL

## 2017-04-25 MED ORDER — ONDANSETRON HCL 4 MG/2ML IJ SOLN
INTRAMUSCULAR | Status: DC | PRN
  Administered 2017-04-25: 4 mg via INTRAVENOUS

## 2017-04-25 MED ORDER — BISACODYL 5 MG PO TBEC
5.0000 mg | DELAYED_RELEASE_TABLET | Freq: Every day | ORAL | Status: DC | PRN
  Administered 2017-04-30 – 2017-05-01 (×2): 5 mg via ORAL
  Filled 2017-04-25 (×2): qty 1

## 2017-04-25 MED ORDER — FENTANYL CITRATE (PF) 100 MCG/2ML IJ SOLN
INTRAMUSCULAR | Status: AC
  Filled 2017-04-25: qty 2

## 2017-04-25 MED ORDER — METOCLOPRAMIDE HCL 5 MG/ML IJ SOLN
5.0000 mg | Freq: Three times a day (TID) | INTRAMUSCULAR | Status: DC | PRN
  Administered 2017-04-26: 10 mg via INTRAVENOUS
  Filled 2017-04-25: qty 2

## 2017-04-25 MED ORDER — METOCLOPRAMIDE HCL 5 MG PO TABS: 5.0000 mg | ORAL_TABLET | Freq: Three times a day (TID) | ORAL | Status: DC | PRN

## 2017-04-25 MED ORDER — MIDAZOLAM HCL 2 MG/2ML IJ SOLN
0.5000 mg | Freq: Once | INTRAMUSCULAR | Status: AC
  Administered 2017-04-25: 0.5 mg via INTRAVENOUS

## 2017-04-25 MED ORDER — FENTANYL CITRATE (PF) 100 MCG/2ML IJ SOLN
INTRAMUSCULAR | Status: AC
  Administered 2017-04-25: 50 ug via INTRAVENOUS
  Filled 2017-04-25: qty 2

## 2017-04-25 MED ORDER — ALUM & MAG HYDROXIDE-SIMETH 200-200-20 MG/5ML PO SUSP
30.0000 mL | ORAL | Status: DC | PRN
  Administered 2017-04-26: 30 mL via ORAL
  Filled 2017-04-25: qty 30

## 2017-04-25 MED ORDER — TORSEMIDE 20 MG PO TABS
20.0000 mg | ORAL_TABLET | Freq: Every day | ORAL | Status: DC
  Administered 2017-04-26 – 2017-04-27 (×2): 20 mg via ORAL
  Filled 2017-04-25 (×3): qty 1

## 2017-04-25 MED ORDER — ONDANSETRON HCL 4 MG/2ML IJ SOLN
INTRAMUSCULAR | Status: AC
  Filled 2017-04-25: qty 2

## 2017-04-25 MED ORDER — ONDANSETRON HCL 4 MG PO TABS
4.0000 mg | ORAL_TABLET | Freq: Four times a day (QID) | ORAL | Status: DC | PRN
  Administered 2017-04-26: 4 mg via ORAL
  Filled 2017-04-25: qty 1

## 2017-04-25 MED ORDER — PHENOL 1.4 % MT LIQD: 1.0000 | OROMUCOSAL | Status: DC | PRN

## 2017-04-25 MED ORDER — BUPIVACAINE LIPOSOME 1.3 % IJ SUSP
20.0000 mL | Freq: Once | INTRAMUSCULAR | Status: DC
  Filled 2017-04-25: qty 20

## 2017-04-25 MED ORDER — CEFAZOLIN SODIUM-DEXTROSE 2-4 GM/100ML-% IV SOLN
2.0000 g | Freq: Four times a day (QID) | INTRAVENOUS | Status: AC
  Administered 2017-04-25 – 2017-04-26 (×2): 2 g via INTRAVENOUS
  Filled 2017-04-25: qty 100

## 2017-04-25 MED ORDER — DILTIAZEM HCL 100 MG IV SOLR
5.0000 mg/h | INTRAVENOUS | Status: AC
  Administered 2017-04-25: 5 mg/h via INTRAVENOUS
  Filled 2017-04-25: qty 100

## 2017-04-25 MED ORDER — BUPIVACAINE LIPOSOME 1.3 % IJ SUSP
INTRAMUSCULAR | Status: DC | PRN
Start: 2017-04-25 — End: 2017-04-25
  Administered 2017-04-25: 20 mL

## 2017-04-25 MED ORDER — CEFAZOLIN SODIUM-DEXTROSE 2-4 GM/100ML-% IV SOLN
INTRAVENOUS | Status: AC
  Filled 2017-04-25: qty 100

## 2017-04-25 MED ORDER — DEXAMETHASONE SODIUM PHOSPHATE 10 MG/ML IJ SOLN
INTRAMUSCULAR | Status: DC | PRN
  Administered 2017-04-25: 10 mg via INTRAVENOUS

## 2017-04-25 MED ORDER — FENTANYL CITRATE (PF) 100 MCG/2ML IJ SOLN
25.0000 ug | INTRAMUSCULAR | Status: DC | PRN
  Administered 2017-04-25 (×2): 50 ug via INTRAVENOUS

## 2017-04-25 MED ORDER — DEXAMETHASONE SODIUM PHOSPHATE 10 MG/ML IJ SOLN
INTRAMUSCULAR | Status: AC
  Filled 2017-04-25: qty 1

## 2017-04-25 MED ORDER — FAMOTIDINE 20 MG PO TABS
20.0000 mg | ORAL_TABLET | Freq: Every day | ORAL | Status: DC
  Administered 2017-04-26 – 2017-05-06 (×11): 20 mg via ORAL
  Filled 2017-04-25 (×11): qty 1

## 2017-04-25 MED ORDER — DIPHENHYDRAMINE HCL 12.5 MG/5ML PO ELIX
12.5000 mg | ORAL_SOLUTION | ORAL | Status: DC | PRN
  Filled 2017-04-25: qty 10

## 2017-04-25 MED ORDER — FENTANYL CITRATE (PF) 250 MCG/5ML IJ SOLN
INTRAMUSCULAR | Status: AC
  Filled 2017-04-25: qty 5

## 2017-04-25 MED ORDER — 0.9 % SODIUM CHLORIDE (POUR BTL) OPTIME
TOPICAL | Status: DC | PRN
  Administered 2017-04-25: 1000 mL

## 2017-04-25 MED ORDER — TRANEXAMIC ACID 1000 MG/10ML IV SOLN
1000.0000 mg | INTRAVENOUS | Status: AC
  Administered 2017-04-25: 1000 mg via INTRAVENOUS
  Filled 2017-04-25: qty 10

## 2017-04-25 MED ORDER — SODIUM CHLORIDE 0.9 % IJ SOLN
INTRAMUSCULAR | Status: DC | PRN
  Administered 2017-04-25: 10 mL
  Administered 2017-04-25: 20 mL

## 2017-04-25 MED ORDER — ONDANSETRON HCL 4 MG/2ML IJ SOLN
4.0000 mg | Freq: Four times a day (QID) | INTRAMUSCULAR | Status: DC | PRN
  Administered 2017-04-26 – 2017-05-01 (×2): 4 mg via INTRAVENOUS
  Filled 2017-04-25 (×2): qty 2

## 2017-04-25 MED ORDER — FENTANYL CITRATE (PF) 100 MCG/2ML IJ SOLN
INTRAMUSCULAR | Status: DC | PRN
  Administered 2017-04-25 (×2): 50 ug via INTRAVENOUS

## 2017-04-25 MED ORDER — FENTANYL CITRATE (PF) 100 MCG/2ML IJ SOLN
50.0000 ug | Freq: Once | INTRAMUSCULAR | Status: AC
  Administered 2017-04-25: 50 ug via INTRAVENOUS

## 2017-04-25 MED ORDER — DOCUSATE SODIUM 100 MG PO CAPS
100.0000 mg | ORAL_CAPSULE | Freq: Two times a day (BID) | ORAL | Status: DC
  Administered 2017-04-25 – 2017-05-06 (×19): 100 mg via ORAL
  Filled 2017-04-25 (×22): qty 1

## 2017-04-25 MED ORDER — LIDOCAINE 2% (20 MG/ML) 5 ML SYRINGE
INTRAMUSCULAR | Status: AC
  Filled 2017-04-25: qty 5

## 2017-04-25 MED ORDER — ONDANSETRON HCL 4 MG/2ML IJ SOLN: 4.0000 mg | Freq: Once | INTRAMUSCULAR | Status: DC | PRN

## 2017-04-25 MED ORDER — DILTIAZEM HCL ER COATED BEADS 120 MG PO CP24: 120.0000 mg | ORAL_CAPSULE | Freq: Every day | ORAL | Status: DC

## 2017-04-25 MED ORDER — HYDROCODONE-ACETAMINOPHEN 5-325 MG PO TABS: 2.0000 | ORAL_TABLET | ORAL | Status: DC | PRN

## 2017-04-25 MED ORDER — SERTRALINE HCL 50 MG PO TABS
75.0000 mg | ORAL_TABLET | Freq: Every day | ORAL | Status: DC
  Administered 2017-04-26 – 2017-05-03 (×8): 75 mg via ORAL
  Filled 2017-04-25 (×5): qty 1
  Filled 2017-04-25: qty 2
  Filled 2017-04-25 (×2): qty 1

## 2017-04-25 MED ORDER — LACTATED RINGERS IV SOLN: INTRAVENOUS | Status: DC

## 2017-04-25 MED ORDER — TRANEXAMIC ACID 1000 MG/10ML IV SOLN
1000.0000 mg | Freq: Once | INTRAVENOUS | Status: AC
  Administered 2017-04-25: 1000 mg via INTRAVENOUS
  Filled 2017-04-25: qty 10

## 2017-04-25 MED ORDER — HYDROMORPHONE HCL 1 MG/ML IJ SOLN: 0.5000 mg | INTRAMUSCULAR | Status: DC | PRN

## 2017-04-25 SURGICAL SUPPLY — 50 items
BAG DECANTER FOR FLEXI CONT (MISCELLANEOUS) IMPLANT
BANDAGE ESMARK 6X9 LF (GAUZE/BANDAGES/DRESSINGS) ×1 IMPLANT
BLADE SAGITTAL 25.0X1.19X90 (BLADE) ×2 IMPLANT
BLADE SAW SGTL 13.0X1.19X90.0M (BLADE) ×2 IMPLANT
BNDG ELASTIC 6X10 VLCR STRL LF (GAUZE/BANDAGES/DRESSINGS) ×2 IMPLANT
BNDG ESMARK 6X9 LF (GAUZE/BANDAGES/DRESSINGS) ×2
BOWL SMART MIX CTS (DISPOSABLE) ×2 IMPLANT
CAP KNEE TOTAL 3 SIGMA ×2 IMPLANT
CEMENT HV SMART SET (Cement) ×4 IMPLANT
COVER SURGICAL LIGHT HANDLE (MISCELLANEOUS) ×2 IMPLANT
CUFF TOURNIQUET SINGLE 34IN LL (TOURNIQUET CUFF) ×2 IMPLANT
DECANTER SPIKE VIAL GLASS SM (MISCELLANEOUS) ×2 IMPLANT
DRAPE EXTREMITY T 121X128X90 (DRAPE) ×2 IMPLANT
DRAPE HALF SHEET 40X57 (DRAPES) ×4 IMPLANT
DRAPE U-SHAPE 47X51 STRL (DRAPES) ×2 IMPLANT
DRSG AQUACEL AG ADV 3.5X10 (GAUZE/BANDAGES/DRESSINGS) ×2 IMPLANT
DURAPREP 26ML APPLICATOR (WOUND CARE) ×2 IMPLANT
ELECT CAUTERY BLADE 6.4 (BLADE) ×2 IMPLANT
ELECT REM PT RETURN 9FT ADLT (ELECTROSURGICAL) ×2
ELECTRODE REM PT RTRN 9FT ADLT (ELECTROSURGICAL) ×1 IMPLANT
GLOVE BIO SURGEON STRL SZ8 (GLOVE) ×4 IMPLANT
GLOVE BIOGEL PI IND STRL 8 (GLOVE) ×2 IMPLANT
GLOVE BIOGEL PI INDICATOR 8 (GLOVE) ×2
GOWN STRL REUS W/ TWL LRG LVL3 (GOWN DISPOSABLE) ×1 IMPLANT
GOWN STRL REUS W/ TWL XL LVL3 (GOWN DISPOSABLE) ×2 IMPLANT
GOWN STRL REUS W/TWL LRG LVL3 (GOWN DISPOSABLE) ×1
GOWN STRL REUS W/TWL XL LVL3 (GOWN DISPOSABLE) ×2
HANDPIECE INTERPULSE COAX TIP (DISPOSABLE) ×1
HOOD PEEL AWAY FACE SHEILD DIS (HOOD) ×4 IMPLANT
IMMOBILIZER KNEE 22 UNIV (SOFTGOODS) ×2 IMPLANT
KIT BASIN OR (CUSTOM PROCEDURE TRAY) ×2 IMPLANT
KIT ROOM TURNOVER OR (KITS) ×2 IMPLANT
MANIFOLD NEPTUNE II (INSTRUMENTS) ×2 IMPLANT
MARKER SKIN DUAL TIP RULER LAB (MISCELLANEOUS) ×2 IMPLANT
NEEDLE HYPO 21X1 ECLIPSE (NEEDLE) ×2 IMPLANT
NS IRRIG 1000ML POUR BTL (IV SOLUTION) ×2 IMPLANT
PACK TOTAL JOINT (CUSTOM PROCEDURE TRAY) ×2 IMPLANT
PAD ARMBOARD 7.5X6 YLW CONV (MISCELLANEOUS) ×4 IMPLANT
PENCIL BUTTON HOLSTER BLD 10FT (ELECTRODE) ×2 IMPLANT
SET HNDPC FAN SPRY TIP SCT (DISPOSABLE) ×1 IMPLANT
STRIP CLOSURE SKIN 1/2X4 (GAUZE/BANDAGES/DRESSINGS) ×2 IMPLANT
SUT VIC AB 0 CT1 27 (SUTURE) ×1
SUT VIC AB 0 CT1 27XBRD ANBCTR (SUTURE) ×1 IMPLANT
SUT VIC AB 2-0 CT1 27 (SUTURE) ×1
SUT VIC AB 2-0 CT1 TAPERPNT 27 (SUTURE) ×1 IMPLANT
SUT VIC AB 3-0 FS2 27 (SUTURE) ×2 IMPLANT
SUT VLOC 180 0 24IN GS25 (SUTURE) ×2 IMPLANT
SYR 50ML LL SCALE MARK (SYRINGE) ×2 IMPLANT
TOWEL OR 17X26 10 PK STRL BLUE (TOWEL DISPOSABLE) ×2 IMPLANT
TRAY CATH 16FR W/PLASTIC CATH (SET/KITS/TRAYS/PACK) IMPLANT

## 2017-04-25 NOTE — Progress Notes (Signed)
Anesthesiology Note:  84 year old female with chronic atrial fibrillation underwent L. total knee arthroplasty today under spinal anesthesia with MAC. Prior to surgery in Pre-op Holding Area she was noted to be in afib with rate 120-130. She was asymptomatic. Cardizem drip started at 5 mg/hr after 5 mg bolus. Rate remained in 80-90 range during surgery and in PACU. Patient initially required neosynephrine for BP support following spinal. Spinal now resolved neosynephrine weaned off.  Patient in PACU in good spirits with minimal pain  VS: T-36.1 HR-84 RR-19 BP 110/70  Cardizem now at 10 mg/hr.  Appreciate Dr. Kyla Balzarine assistance in managing afib.  Roberts Gaudy

## 2017-04-25 NOTE — Op Note (Signed)
PREOP DIAGNOSIS: DJD LEFT KNEE POSTOP DIAGNOSIS:  same PROCEDURE: LEFT TKR ANESTHESIA: Spinal and MAC ATTENDING SURGEON: Kimiya Brunelle G ASSISTANT: Loni Dolly PA  INDICATIONS FOR PROCEDURE: [name] is a 84 y.o. female who has struggled for a long time with pain due to degenerative arthritis of the left knee.  The patient has failed many conservative non-operative measures and at this point has pain which limits the ability to sleep and walk.  The patient is offered total knee replacement.  Informed operative consent was obtained after discussion of possible risks of anesthesia, infection, neurovascular injury, DVT, and death.  The importance of the post-operative rehabilitation protocol to optimize result was stressed extensively with the patient.  SUMMARY OF FINDINGS AND PROCEDURE:  [name] was taken to the operative suite where under the above anesthesia a left knee replacement was performed.  There were advanced degenerative changes and the bone quality was good.  We used the DePuyLCS system and placed size standard femur, five tibia, 35 mm all polyethylene patella, and a size 12.5 mm spacer.  Loni Dolly PA-C assisted throughout and was invaluable to the completion of the case in that he helped retract and maintain exposure while I placed the components.  He also helped close thereby minimizing OR time.  The patient was admitted for appropriate post-op care to include perioperative antibiotics and mechanical and pharmacologic measures for DVT prophylaxis.  DESCRIPTION OF PROCEDURE:  [name] was taken to the operative suite where the above anesthesia was applied.  The patient was positioned supine and prepped and draped in normal sterile fashion.  An appropriate time out was performed.  After the administration of kefzol pre-op antibiotic the leg was elevated and exsanguinated and a tourniquet inflated.  A standard longitudinal incision was made on the anterior knee.   Dissection was carried down to the extensor mechanism.  All appropriate anti-infective measures were used including the pre-operative antibiotic, betadine impregnated drape, and closed hooded exhaust systems for each member of the surgical team.  A medial parapatellar incision was made in the extensor mechanism and the knee cap flipped and the knee flexed.  Some residual meniscal tissues were removed along with any remaining ACL/PCL tissue.  A guide was placed on the tibia and a flat cut was made on it's superior surface.  An intramedullary guide was placed in the femur and was utilized to make anterior and posterior cuts creating an appropriate flexion gap.  A second intramedullary guide was placed in the femur to make a distal cut properly balancing the knee with an extension gap equal to the flexion gap.  The three bones sized to the above mentioned sizes and the appropriate guides were placed and utilized.  A trial reduction was done and the knee easily came to full extension and the patella tracked well on flexion.  The trial components were removed and all bones were cleaned with pulsatile lavage and then dried thoroughly.  Cement was mixed and was pressurized onto the bones followed by placement of the aforementioned components.  Excess cement was trimmed and pressure was held on the components until the cement had hardened.  The tourniquet was deflated and a small amount of bleeding was controlled with cautery and pressure.  The knee was irrigated thoroughly.  The extensor mechanism was re-approximated with V-loc suture in running fashion.  The knee was flexed and the repair was solid.  The subcutaneous tissues were re-approximated with #0 and #2-0 vicryl and the skin closed  with a subcuticular stitch and steristrips.  A sterile dressing was applied.  Intraoperative fluids, EBL, and tourniquet time can be obtained from anesthesia records.  DISPOSITION:  The patient was taken to recovery room in stable  condition and admitted for appropriate post-op care to include peri-operative antibiotic and DVT prophylaxis with mechanical and pharmacologic measures.  Demetrice Amstutz G 04/25/2017, 1:33 PM

## 2017-04-25 NOTE — Anesthesia Procedure Notes (Signed)
Procedure Name: MAC Date/Time: 04/25/2017 12:10 PM Performed by: Izora Gala, CRNA Pre-anesthesia Checklist: Patient identified, Emergency Drugs available, Suction available and Patient being monitored Patient Re-evaluated:Patient Re-evaluated prior to induction Oxygen Delivery Method: Simple face mask Induction Type: IV induction Ventilation: Oral airway inserted - appropriate to patient size Placement Confirmation: positive ETCO2

## 2017-04-25 NOTE — Progress Notes (Signed)
Care of pt assumed by MA [facility]

## 2017-04-25 NOTE — H&P (Signed)
[name], MD  Bryna Colander, PA-C  Elodia Florence, PA-C                                  [facility]                [address], [place], [place]  [address]   ORTHOPAEDIC CONSULTATION  [name]            MRN:  [id number] DOB/SEX:  02-17-1931/female     CHIEF COMPLAINT:  Painful left knee  HISTORY: Catherine Kitamura Sublettis a 84 y.o. female with many years of pain in the left knee.  This has persisted despite multiple injections and other conservative measures.  She has pain which limits her ability to walk and rest and is offered TKR.    PAST MEDICAL HISTORY: Patient Active Problem List   Diagnosis Date Noted  . Healthcare maintenance 02/08/2017  . Vitamin D deficiency 02/08/2017  . Acute diarrhea 09/27/2016  . IBS (irritable bowel syndrome) 08/31/2016  . At risk for falling 01/07/2016  . Chronic diastolic CHF (congestive heart failure), NYHA class 2 (HCC)   . Rectus sheath hematoma- no anticoagulation   . Atrial fibrillation with RVR- CHADs VASc=4 06/18/2015  . Diarrhea 06/12/2015  . Advance care planning 10/23/2014  . Leg edema 06/27/2012  . Medicare annual wellness visit, subsequent 03/27/2012  . Depression 03/05/2012  . EDEMA 06/07/2010  . Osteoporosis 05/23/2010  . KNEE PAIN, LEFT 03/02/2010  . LBBB (left bundle branch block) 10/28/2008  . POST MI SEPTAL DEFECT 06/24/2008  . Cough 06/24/2008  . Congestive dilated cardiomyopathy (HCC) 05/30/2008  . ATRIAL FIBRILLATION 05/30/2008  . UNSPECIFIED HEMORRHAGE 05/30/2008  . HYPONATREMIA, HX OF 05/30/2008   Past Medical History:  Diagnosis Date  . Anxiety   . Arthritis    "knees" (06/18/2015)  . Atrial fibrillation (HCC)    h/o sig bleed on coumadin  . Atrial fibrillation with RVR (HCC) 06/18/2015  . CHF (congestive heart failure) (HCC)   . Complication of anesthesia 2010   "w/cardiac cath; got into a psychotic state for ~ 3 days; got me out w/Valium"  . Depression    "I have it off and on; not as  often as I've gotten older" (06/18/2015)  . Diverticulosis    descending and sigmoid colon--Dr. Jarold Motto  . DJD (degenerative joint disease) of knee    left  . Dysrhythmia   . GERD (gastroesophageal reflux disease)   . Insomnia     off chronic ambien 5mg  as of 10/11, rare/episodic use since then  DCM/CHF  . Nonischemic cardiomyopathy (HCC)    normal coronaries, EF 15-20% 04/2008, EF 50-55% 2015  . Osteoporosis    on DXA 05/2010  . Sciatica    Right   Past Surgical History:  Procedure Laterality Date  . CARDIAC CATHETERIZATION  2010  . CARDIOVERSION N/A 06/19/2015   Procedure: CARDIOVERSION;  Surgeon: Laurey Morale, MD;  Location: [facility] ENDOSCOPY;  Service: Cardiovascular;  Laterality: N/A;  . CATARACT EXTRACTION W/ INTRAOCULAR LENS  IMPLANT, BILATERAL Bilateral   . DILATION AND CURETTAGE OF UTERUS    . EYE SURGERY    . TEE WITHOUT CARDIOVERSION N/A 06/19/2015   Procedure: TRANSESOPHAGEAL ECHOCARDIOGRAM (TEE);  Surgeon: Laurey Morale, MD;  Location: [facility] ENDOSCOPY;  Service: Cardiovascular;  Laterality: N/A;  . VAGINAL HYSTERECTOMY  1979   ovaries intact     MEDICATIONS:   Current Facility-Administered  Medications:  .  ceFAZolin (ANCEF) IVPB 2g/100 mL premix, 2 g, Intravenous, To SS-Surg, [name], MD .  chlorhexidine (HIBICLENS) 4 % liquid 4 application, 60 mL, Topical, Once, [name], MD .  diltiazem (CARDIZEM) 100 mg in dextrose 5 % 100 mL (1 mg/mL) infusion, 5-15 mg/hr, Intravenous, To OR, Kipp Brood, MD .  fentaNYL (SUBLIMAZE) 100 MCG/2ML injection, , , ,  .  lactated ringers infusion, , Intravenous, Continuous, [name], MD .  midazolam (VERSED) 2 MG/2ML injection, , , ,  .  tranexamic acid (CYKLOKAPRON) 2,000 mg in sodium chloride 0.9 % 50 mL Topical Application, 2,000 mg, Topical, To OR, [name], MD  ALLERGIES:   Allergies  Allergen Reactions  . Ace Inhibitors Cough  . Warfarin Sodium Other (See Comments)    DOSE RELATED PHARMACOLOGIC  EFFECT "bleed out"  . Codeine Other (See Comments)    sedation  . Delsym [Dextromethorphan Polistirex Er] Other (See Comments)    dizziness  . Lactose Intolerance (Gi) Diarrhea  . Phenylephrine Palpitations and Other (See Comments)    Nasal spray- "likely increase in nasal congestion".   . Sulfamethoxazole-Trimethoprim Nausea And Vomiting    GI intolerance.  . Tramadol Other (See Comments)    Hallucinations, while on zoloft concurrently.      REVIEW OF SYSTEMS: REVIEWED IN DETAIL IN CHART  FAMILY HISTORY:   Family History  Problem Relation Age of Onset  . Cancer Father   . Colon cancer Father        possible colon cancer  . Tuberculosis Mother   . Breast cancer Neg Hx     SOCIAL HISTORY:   Social History   Tobacco Use  . Smoking status: Never Smoker  . Smokeless tobacco: Never Used  Substance Use Topics  . Alcohol use: No    Alcohol/week: 0.0 oz     EXAMINATION: Vital signs in last 24 hours: Temp:  [98.2 F (36.8 C)] 98.2 F (36.8 C) (01/15 0945) Pulse Rate:  [142] 142 (01/15 0945) Resp:  [20] 20 (01/15 0945) BP: (118)/(78) 118/78 (01/15 0945) SpO2:  [95 %] 95 % (01/15 0945)  BP 118/78   Pulse (!) 142   Temp 98.2 F (36.8 C) (Oral)   Resp 20   SpO2 95%   General Appearance:    Alert, cooperative, no distress, appears stated age  Head:    Normocephalic, without obvious abnormality, atraumatic  Eyes:    PERRL, conjunctiva/corneas clear, EOM's intact, fundi    benign, both eyes  Ears:    Normal TM's and external ear canals, both ears  Nose:   Nares normal, septum midline, mucosa normal, no drainage    or sinus tenderness  Throat:   Lips, mucosa, and tongue normal; teeth and gums normal  Neck:   Supple, symmetrical, trachea midline, no adenopathy;    thyroid:  no enlargement/tenderness/nodules; no carotid   bruit or JVD  Back:     Symmetric, no curvature, ROM normal, no CVA tenderness  Lungs:     Clear to auscultation bilaterally, respirations unlabored   Chest Wall:    No tenderness or deformity   Heart:    Regular rate and rhythm, S1 and S2 normal, no murmur, rub   or gallop  Breast Exam:    No tenderness, masses, or nipple abnormality  Abdomen:     Soft, non-tender, bowel sounds active all four quadrants,    no masses, no organomegaly  Genitalia:    Rectal:    Extremities:  Extremities normal, atraumatic, no cyanosis or edema  Pulses:   2+ and symmetric all extremities  Skin:   Skin color, texture, turgor normal, no rashes or lesions  Lymph nodes:   Cervical, supraclavicular, and axillary nodes normal  Neurologic:   CNII-XII intact, normal strength, sensation and reflexes    throughout    Musculoskeletal Exam:   Left leg has moderate valgus deformity with limited ROM at knee.  She has crepitation and lateral joint line pain.   DIAGNOSTIC STUDIES: Recent laboratory studies: Recent Labs    04/19/17 1030  WBC 8.2  HGB 12.4  HCT 39.2  PLT 276   Recent Labs    04/19/17 1030  NA 136  K 3.2*  CL 97*  CO2 29  BUN 21*  CREATININE 1.13*  GLUCOSE 96  CALCIUM 9.2   Lab Results  Component Value Date   INR 1.19 04/19/2017   INR 1.14 06/19/2015   INR 1.1 05/15/2008     Recent Radiographic Studies :  Dg Chest 2 View  Result Date: 04/19/2017 CLINICAL DATA:  Preoperative evaluation for knee replacement surgery, history of atrial fibrillation, CHF, GERD EXAM: CHEST  2 VIEW COMPARISON:  06/18/2015 FINDINGS: Enlargement of cardiac silhouette with slight vascular congestion. Atherosclerotic calcifications aorta. Lungs mildly hyperinflated with chronic bronchitic and interstitial changes grossly stable since prior exam. No acute infiltrate, pleural effusion or pneumothorax. Bones demineralized. IMPRESSION: Hyperinflated lungs with chronic bronchitic and interstitial changes question fibrosis. Enlargement of cardiac silhouette with slight pulmonary vascular congestion. No definite acute abnormalities. Electronically Signed   By: Ulyses Southward M.D.   On: 04/19/2017 14:04    ASSESSMENT:  Left knee end stage DJD   PLAN:  We have elected to go forward with TKR.  RIsks of anesthesia, infection, DVT, and even death discussed with the patient.  Benino Korinek G 04/25/2017, 10:55 AM

## 2017-04-25 NOTE — Consult Note (Signed)
.    Cardiology Consultation:   Patient ID: [name]; [id number]; October 19, 1930   Admit date: 04/25/2017 Date of Consult: 04/25/2017  Primary Care Provider: [name], MD Primary Cardiologist: Johnsie Cancel Primary Electrophysiologist: NA  Patient Profile:   [name] is a 84 y.o. female with a hx of chronic atrial fibrillation on low dose Eliquis (with failed TEE CV 06/19/15), NICM with an EF of 50% per echo in 8768, chronic diastolic heart failure, LBBB, and prior spontaneous retroperitoneal bleed on Coumadin who is being seen today for the evaluation of Afib with rapid ventricular response at the request of Dr. Rhona Raider.   History of Present Illness:   [name] is a pleasant 84 yo F with chronic atrial fibrillation on Eliquis (held for procedure today) who presented to [facility] for left knee replacement secondary to degenerative arthritis of the left knee.   Her surgery went unremarkable and she was transported to PACU. While in PACU, the pt was noted to be in Afib RVR with a rate of 120. She has been asymptomatic the entire time. The pt reports that she took her Toprol-XL and Diltiazem CD this AM pre-operatively as directed. Her Eliquis has been held prior to her procedure.   In the past she has had an unsuccessful TEE CV (06/19/15) with an unfortunate right rectus sheath hematoma while on Heparin which required hospitalization. She is followed by Dr. Johnsie Cancel, who last saw her prior to her procedure on 03/13/2017.   Spinal and MAC were used for anaesthesia. Hb stable at 12.4>> appears to above her baseline of 10-11. Currently undergoing mechanical prophylaxis. Denies pain and is stable.   Past Medical History:  Diagnosis Date  . Anxiety   . Arthritis    "knees" (06/18/2015)  . Atrial fibrillation ([place])    h/o sig bleed on coumadin  . Atrial fibrillation with RVR ([place]) 06/18/2015  . CHF (congestive heart failure) ([place])   . Complication of anesthesia 2010   "w/cardiac cath; got  into a psychotic state for ~ 3 days; got me out w/Valium"  . Depression    "I have it off and on; not as often as I've gotten older" (06/18/2015)  . Diverticulosis    descending and sigmoid colon--Dr. Sharlett Iles  . DJD (degenerative joint disease) of knee    left  . Dysrhythmia   . GERD (gastroesophageal reflux disease)   . Insomnia     off chronic ambien 65m as of 10/11, rare/episodic use since then  DCM/CHF  . [place]ischemic cardiomyopathy ([place]    normal coronaries, EF 15-20% 04/2008, EF 50-55% 2015  . Osteoporosis    on DXA 05/2010  . Sciatica    Right    Past Surgical History:  Procedure Laterality Date  . CARDIAC CATHETERIZATION  2010  . CARDIOVERSION N/A 06/19/2015   Procedure: CARDIOVERSION;  Surgeon: DLarey Dresser MD;  [facility]  Service: Cardiovascular;  Laterality: N/A;  . CATARACT EXTRACTION W/ INTRAOCULAR LENS  IMPLANT, BILATERAL Bilateral   . DILATION AND CURETTAGE OF UTERUS    . EYE SURGERY    . TEE WITHOUT CARDIOVERSION N/A 06/19/2015   Procedure: TRANSESOPHAGEAL ECHOCARDIOGRAM (TEE);  Surgeon: DLarey Dresser MD;  [facility]  Service: Cardiovascular;  Laterality: N/A;  . VAGINAL HYSTERECTOMY  1979   ovaries intact     Prior to Admission medications   Medication Sig Start Date End Date Taking? Authorizing Provider  acetaminophen (TYLENOL) 650 MG CR tablet Take 1,300 mg by mouth  every 8 (eight) hours as needed for pain.    Yes [provider]  apixaban (ELIQUIS) 2.5 MG TABS tablet Take 1 tablet (2.5 mg total) by mouth 2 (two) times daily. 01/12/17  Yes Josue Hector, MD  ARTIFICIAL TEAR OP Place 1 drop into both eyes daily as needed (for dry eyes).   Yes [provider]  B Complex-C (SUPER B COMPLEX PO) Take 1 capsule by mouth daily.   Yes [provider]  Biotin 1000 MCG tablet Take 1,000 mcg by mouth daily.    Yes [provider]  Cholecalciferol (VITAMIN D PO) Take 1 capsule by mouth daily.    Yes  [provider]  diltiazem (CARDIZEM CD) 120 MG 24 hr capsule Take 1 capsule (120 mg total) by mouth daily. 10/03/16  Yes Josue Hector, MD  metoprolol succinate (TOPROL-XL) 25 MG 24 hr tablet Take 1 tablet (25 mg total) by mouth daily. 01/11/17  Yes Josue Hector, MD  ranitidine (ZANTAC) 150 MG tablet Take 150 mg by mouth daily.    Yes [provider]  sertraline (ZOLOFT) 50 MG tablet 1.5 tabs by mouth per day Patient taking differently: Take 75 mg by mouth daily.  04/13/17  Yes [name], MD  simethicone (MYLICON) 80 MG chewable tablet Chew 1 tablet (80 mg total) by mouth daily as needed. Patient taking differently: Chew 80 mg by mouth daily as needed for flatulence.  08/30/16  Yes [name], MD  torsemide (DEMADEX) 20 MG tablet Take 1 tablet (20 mg total) by mouth daily. PT TAKES AN EXTRA 20 MG BY MOUTH A DAY IF WEIGHT > THEN 2 POUNDS. 04/13/17  Yes [name], MD  naproxen sodium (ALEVE) 220 MG tablet Take 220 mg by mouth 2 (two) times daily as needed (for pain or headache).    [provider]    Inpatient Medications: Scheduled Meds: . bupivacaine liposome  20 mL Infiltration Once  . chlorhexidine  60 mL Topical Once   Continuous Infusions: . lactated ringers 50 mL/hr at 04/25/17 1114   PRN Meds: fentaNYL (SUBLIMAZE) injection, ondansetron (ZOFRAN) IV  Allergies:    Allergies  Allergen Reactions  . Ace Inhibitors Cough  . Warfarin Sodium Other (See Comments)    DOSE RELATED PHARMACOLOGIC EFFECT "bleed out"  . Codeine Other (See Comments)    sedation  . Delsym [Dextromethorphan Polistirex Er] Other (See Comments)    dizziness  . Lactose Intolerance (Gi) Diarrhea  . Phenylephrine Palpitations and Other (See Comments)    Nasal spray- "likely increase in nasal congestion".   . Sulfamethoxazole-Trimethoprim Nausea And Vomiting    GI intolerance.  . Tramadol Other (See Comments)    Hallucinations, while on zoloft concurrently.       Social History:   Social History   Socioeconomic History  . Marital status: Married    Spouse name: Not on file  . Number of children: 2  . Years of education: Not on file  . Highest education level: Not on file  Social Needs  . Financial resource strain: Not on file  . Food insecurity - worry: Not on file  . Food insecurity - inability: Not on file  . Transportation needs - medical: Not on file  . Transportation needs - non-medical: Not on file  Occupational History  . Occupation: Retired    Fish farm manager: RETIRED  Tobacco Use  . Smoking status: Never Smoker  . Smokeless tobacco: Never Used  Substance and Sexual Activity  .  Alcohol use: No    Alcohol/week: 0.0 oz  . Drug use: No  . Sexual activity: No  Other Topics Concern  . Not on file  Social History Narrative   Retired, former [employer]   Married, [phone]   Lives with husband   2 grown children, one in [place], one out of state   Tobacco Use - No.    Drug Use - no   teaches Sunday school, reading    Family History:   Family History  Problem Relation Age of Onset  . Cancer Father   . Colon cancer Father        possible colon cancer  . Tuberculosis Mother   . Breast cancer Neg Hx    Family Status:  Family Status  Relation Name Status  . Father  Deceased at age 37  . Mother  Deceased at age 22       TB  . MGM  Deceased  . MGF  Deceased  . PGM  Deceased  . PGF  Deceased  . Neg Hx  (Not Specified)    ROS:  Please see the history of present illness.  All other ROS reviewed and negative.     Physical Exam/Data:   Vitals:   04/25/17 1408 04/25/17 1415 04/25/17 1424 04/25/17 1459  BP: 95/69 112/68 117/81 110/82  Pulse: 85 97 (!) 116 84  Resp: 16 14 17  (!) 21  Temp: 98 F (36.7 C)     TempSrc:      SpO2: 95% 97% 100% 98%    Intake/Output Summary (Last 24 hours) at 04/25/2017 1556 Last data filed at 04/25/2017 1416 Gross per 24 hour  Intake 2200 ml  Output 225 ml  Net 1975 ml   There  were no vitals filed for this visit. There is no height or weight on file to calculate BMI.   General: Well developed, well nourished, NAD Skin: Warm, dry, intact  Head: Normocephalic, atraumatic, clear, moist mucus membranes. Neck: Negative for carotid bruits. No JVD Lungs:Clear to ausculation bilaterally. No wheezes, rales, or rhonchi. Breathing is unlabored. Cardiovascular: Irregular with S1 S2. No murmurs, rubs, or gallops Abdomen: Soft, non-tender, non-distended with normoactive bowel sounds.No obvious abdominal masses. MSK: Strength and tone appear normal for age. 5/5 in all extremities Extremities: No edema. No clubbing or cyanosis. DP/PT pulses 2+ bilaterally Neuro: Alert and oriented. No focal deficits. No facial asymmetry. MAE spontaneously. Psych: Responds to questions appropriately with normal affect.    EKG:  The EKG was personally reviewed and demonstrates: 04/25/17 Afib with RVR HR 124 Telemetry:  Telemetry was personally reviewed and demonstrates: Afib HR 106  Relevant CV Studies:  ECHO: 06/19/15 Study Conclusions  - Left ventricle: The cavity size was normal. Wall thickness was   normal. The estimated ejection fraction was 50%. Diffuse   hypokinesis. - Aortic valve: There was no stenosis. There was trivial   regurgitation. - Aorta: Normal caliber thoracic aorta with minimal plaque. - Mitral valve: There was trivial regurgitation. - Left atrium: The atrium was moderately to severely dilated. No   evidence of thrombus in the atrial cavity or appendage. - Right ventricle: The cavity size was normal. Systolic function   was normal. - Right atrium: The atrium was moderately to severely dilated. - Atrial septum: No defect or patent foramen ovale was identified. - Tricuspid valve: There was moderate regurgitation. Peak RV-RA   gradient (S): 24 mm Hg.  Impressions:  - May proceed to DCCV.  CATH: NA  Laboratory Data:  Chemistry Recent Labs  Lab  04/19/17 1030  NA 136  K 3.2*  CL 97*  CO2 29  GLUCOSE 96  BUN 21*  CREATININE 1.13*  CALCIUM 9.2  GFRNONAA 43*  GFRAA 50*  ANIONGAP 10    Total Protein  Date Value Ref Range Status  09/27/2016 6.4 6.0 - 8.3 g/dL Final   Albumin  Date Value Ref Range Status  09/27/2016 3.8 3.5 - 5.2 g/dL Final   AST  Date Value Ref Range Status  09/27/2016 24 0 - 37 U/L Final   ALT  Date Value Ref Range Status  09/27/2016 20 0 - 35 U/L Final   Alkaline Phosphatase  Date Value Ref Range Status  09/27/2016 94 39 - 117 U/L Final   Total Bilirubin  Date Value Ref Range Status  09/27/2016 0.7 0.2 - 1.2 mg/dL Final   Hematology Recent Labs  Lab 04/19/17 1030  WBC 8.2  RBC 4.12  HGB 12.4  HCT 39.2  MCV 95.1  MCH 30.1  MCHC 31.6  RDW 13.7  PLT 276   Cardiac EnzymesNo results for input(s): TROPONINI in the last 168 hours. No results for input(s): TROPIPOC in the last 168 hours.  BNPNo results for input(s): BNP, PROBNP in the last 168 hours.  DDimer No results for input(s): DDIMER in the last 168 hours. TSH:  Lab Results  Component Value Date   TSH 2.23 09/27/2016   Lipids: Lab Results  Component Value Date   CHOL 209 (H) 01/30/2017   HDL 55.50 01/30/2017   LDLCALC 124 (H) 01/30/2017   LDLDIRECT 56.0 10/17/2014   TRIG 151.0 (H) 01/30/2017   CHOLHDL 4 01/30/2017   HgbA1c: Lab Results  Component Value Date   HGBA1C 5.6 06/18/2015    Radiology/Studies:  No results found.  Assessment and Plan:   1. Atrial Fibrillation with Rapid Ventricular response with underlying chronic afib: -Currently on Diltiazem gtt at 59m/hr -Will continue gtt for now, resume PO meds in the AM  -Pt states that she took her Toprol XL 261mand Cardizem CD 12030mhis AM prior to surgery as directed -She has not taken her Eliquis for the last 4 days, pre-operatively  -Resume Eliquis per ortho>> per MD, can resume tomorrow AM -CHADs VASc=4  2. NICM/chronic diastolic heart failure: -Pt at  risk to go into HF -Will need to monitor strict I&O -Daily weight  -EF per echo 2017 50%  3. Left knee replacement: -Per Ortho -On abx -Pt comfortable and resting currently  For questions or updates, please contact [facility] consult [email or web address] for contact info under Cardiology/STEMI.   [facility]del [facility] Pager: [phone]/2019 3:56 PM  Patient examined chart reviewed. Discussed case with nurse, Dr DahNovella Olivend PA. She is currently stable No signs of CHF. BP a bit soft. She is on neo synephrine and Cardizem drip Lungs are clear no murmur  Post Left TKR with imobilizer. Continue cardizem drip . Will not titrate up until neo synephrine weaned  Known non ischemic DCM Lasix for positive I/O's Resume oral metoprolol and low dose eliquis in am Updated family   PetJenkins Rouge

## 2017-04-25 NOTE — Interval H&P Note (Signed)
History and Physical Interval Note:  04/25/2017 10:58 AM  [name]  has presented today for surgery, with the diagnosis of LEFT KNEE DEGENERATIVE JOINT DISEASE  The various methods of treatment have been discussed with the patient and family. After consideration of risks, benefits and other options for treatment, the patient has consented to  Procedure(s): TOTAL KNEE ARTHROPLASTY (Left) as a surgical intervention .  The patient's history has been reviewed, patient examined, no change in status, stable for surgery.  I have reviewed the patient's chart and labs.  Questions were answered to the patient's satisfaction.     Shaneil Yazdi G

## 2017-04-25 NOTE — Anesthesia Procedure Notes (Signed)
Anesthesia Regional Block: Adductor canal block   Pre-Anesthetic Checklist: ,, timeout performed, Correct Patient, Correct Site, Correct Laterality, Correct Procedure, Correct Position, site marked, Risks and benefits discussed, pre-op evaluation,  At surgeon's request and post-op pain management  Laterality: Left  Prep: Maximum Sterile Barrier Precautions used, chloraprep       Needles:  Injection technique: Single-shot  Needle Type: Echogenic Stimulator Needle     Needle Length: 9cm  Needle Gauge: 21     Additional Needles:   Procedures:,,,, ultrasound used (permanent image in chart),,,,  Narrative:  Start time: 04/25/2017 11:40 AM End time: 04/25/2017 11:45 AM Injection made incrementally with aspirations every 5 mL.  Performed by: Personally  Anesthesiologist: Kipp Brood, MD  Additional Notes: 20 cc 0.75% ropivacaine injected easily

## 2017-04-25 NOTE — Progress Notes (Signed)
Seen by dr Eden Emms will wean off  Neo if bp remains stable and plan to increase cardizem to 10mg /hr and will maintain  Until am

## 2017-04-25 NOTE — Anesthesia Procedure Notes (Signed)
Spinal  Patient location during procedure: OR Start time: 04/25/2017 12:10 PM End time: 04/25/2017 12:15 PM Staffing Anesthesiologist: Kipp Brood, MD Performed: anesthesiologist  Preanesthetic Checklist Completed: patient identified, site marked, surgical consent, pre-op evaluation, IV checked, risks and benefits discussed and monitors and equipment checked Spinal Block Patient position: sitting Prep: ChloraPrep Patient monitoring: heart rate, cardiac monitor, continuous pulse ox and blood pressure Approach: right paramedian Location: L3-4 Needle Needle type: Tuohy  Needle gauge: 22 G Assessment Sensory level: T6 Additional Notes 12 mg 0.75% Bupivacaine injected easily

## 2017-04-25 NOTE — Anesthesia Postprocedure Evaluation (Signed)
Anesthesia Post Note  Patient: [name]  Procedure(s) Performed: TOTAL KNEE ARTHROPLASTY (Left Knee)     Patient location during evaluation: PACU Anesthesia Type: General Level of consciousness: oriented and awake and alert Pain management: pain level controlled Vital Signs Assessment: post-procedure vital signs reviewed and stable Respiratory status: spontaneous breathing, respiratory function stable and patient connected to nasal cannula oxygen Cardiovascular status: blood pressure returned to baseline and stable Postop Assessment: no headache, no backache and no apparent nausea or vomiting Anesthetic complications: no Comments: Spinal resolved. Patient awake and alert, minimal pain. On cardizem 10 mg/hr, HR 90-100    Last Vitals:  Vitals:   04/25/17 1856 04/25/17 1915  BP: 96/66 114/75  Pulse: 79 (!) 116  Resp: 17 20  Temp:    SpO2: 93% 94%    Last Pain:  Vitals:   04/25/17 1845  TempSrc:   PainSc: 0-No pain                 Trenell Concannon COKER

## 2017-04-25 NOTE — Transfer of Care (Signed)
Immediate Anesthesia Transfer of Care Note  Patient: [name]  Procedure(s) Performed: TOTAL KNEE ARTHROPLASTY (Left Knee)  Patient Location: PACU  Anesthesia Type:Spinal  Level of Consciousness: awake, alert  and oriented  Airway & Oxygen Therapy: Patient Spontanous Breathing and Patient connected to nasal cannula oxygen  Post-op Assessment: Report given to RN and Post -op Vital signs reviewed and stable  Post vital signs: Reviewed and stable  Last Vitals:  Vitals:   04/25/17 1140 04/25/17 1145  BP: 100/70 100/64  Pulse: 80 69  Resp: 13 16  Temp:    SpO2: 96% 97%    Last Pain:  Vitals:   04/25/17 0945  TempSrc: Oral         Complications: No apparent anesthesia complications

## 2017-04-25 NOTE — Anesthesia Preprocedure Evaluation (Addendum)
Anesthesia Evaluation  Patient identified by MRN, date of birth, ID band Patient awake    Reviewed: Allergy & Precautions, NPO status , Patient's Chart, lab work & pertinent test results  Airway Mallampati: II  TM Distance: >3 FB Neck ROM: Full    Dental  (+) Teeth Intact, Dental Advisory Given   Pulmonary    breath sounds clear to auscultation       Cardiovascular  Rhythm:Irregular Rate:Tachycardia     Neuro/Psych    GI/Hepatic   Endo/Other    Renal/GU      Musculoskeletal   Abdominal   Peds  Hematology   Anesthesia Other Findings   Reproductive/Obstetrics                            Anesthesia Physical Anesthesia Plan  ASA: III  Anesthesia Plan: Spinal   Post-op Pain Management:  Regional for Post-op pain   Induction: Intravenous  PONV Risk Score and Plan: Ondansetron and Dexamethasone  Airway Management Planned: Simple Face Mask and Natural Airway  Additional Equipment:   Intra-op Plan:   Post-operative Plan: Extubation in OR  Informed Consent: I have reviewed the patients History and Physical, chart, labs and discussed the procedure including the risks, benefits and alternatives for the proposed anesthesia with the patient or authorized representative who has indicated his/her understanding and acceptance.   Dental advisory given  Plan Discussed with: CRNA and Anesthesiologist  Anesthesia Plan Comments:        Anesthesia Quick Evaluation

## 2017-04-25 NOTE — H&P (Signed)
TOTAL KNEE ADMISSION H&P  Patient is being admitted for left total knee arthroplasty.  Subjective:  Chief Complaint:left knee pain.  HPI: [name], 84 y.o. female, has a history of pain and functional disability in the left knee due to arthritis and has failed non-surgical conservative treatments for greater than 12 weeks to includeNSAID's and/or analgesics, corticosteriod injections, viscosupplementation injections, flexibility and strengthening excercises, use of assistive devices, weight reduction as appropriate and activity modification.  Onset of symptoms was gradual, starting 5 years ago with gradually worsening course since that time. The patient noted no past surgery on the left knee(s).  Patient currently rates pain in the left knee(s) at 10 out of 10 with activity. Patient has night pain, worsening of pain with activity and weight bearing, pain that interferes with activities of daily living, crepitus and joint swelling.  Patient has evidence of subchondral cysts, subchondral sclerosis, periarticular osteophytes and joint space narrowing by imaging studies. There is no active infection.  Patient Active Problem List   Diagnosis Date Noted  . Healthcare maintenance 02/08/2017  . Vitamin D deficiency 02/08/2017  . Acute diarrhea 09/27/2016  . IBS (irritable bowel syndrome) 08/31/2016  . At risk for falling 01/07/2016  . Chronic diastolic CHF (congestive heart failure), NYHA class 2 (HCC)   . Rectus sheath hematoma- no anticoagulation   . Atrial fibrillation with RVR- CHADs VASc=4 06/18/2015  . Diarrhea 06/12/2015  . Advance care planning 10/23/2014  . Leg edema 06/27/2012  . Medicare annual wellness visit, subsequent 03/27/2012  . Depression 03/05/2012  . EDEMA 06/07/2010  . Osteoporosis 05/23/2010  . KNEE PAIN, LEFT 03/02/2010  . LBBB (left bundle branch block) 10/28/2008  . POST MI SEPTAL DEFECT 06/24/2008  . Cough 06/24/2008  . Congestive dilated cardiomyopathy (HCC)  05/30/2008  . ATRIAL FIBRILLATION 05/30/2008  . UNSPECIFIED HEMORRHAGE 05/30/2008  . HYPONATREMIA, HX OF 05/30/2008   Past Medical History:  Diagnosis Date  . Anxiety   . Arthritis    "knees" (06/18/2015)  . Atrial fibrillation (HCC)    h/o sig bleed on coumadin  . Atrial fibrillation with RVR (HCC) 06/18/2015  . CHF (congestive heart failure) (HCC)   . Complication of anesthesia 2010   "w/cardiac cath; got into a psychotic state for ~ 3 days; got me out w/Valium"  . Depression    "I have it off and on; not as often as I've gotten older" (06/18/2015)  . Diverticulosis    descending and sigmoid colon--Dr. Jarold Motto  . DJD (degenerative joint disease) of knee    left  . Dysrhythmia   . GERD (gastroesophageal reflux disease)   . Insomnia     off chronic ambien 5mg  as of 10/11, rare/episodic use since then  DCM/CHF  . Nonischemic cardiomyopathy (HCC)    normal coronaries, EF 15-20% 04/2008, EF 50-55% 2015  . Osteoporosis    on DXA 05/2010  . Sciatica    Right    Past Surgical History:  Procedure Laterality Date  . CARDIAC CATHETERIZATION  2010  . CARDIOVERSION N/A 06/19/2015   Procedure: CARDIOVERSION;  Surgeon: Laurey Morale, MD;  Location: [facility] ENDOSCOPY;  Service: Cardiovascular;  Laterality: N/A;  . CATARACT EXTRACTION W/ INTRAOCULAR LENS  IMPLANT, BILATERAL Bilateral   . DILATION AND CURETTAGE OF UTERUS    . EYE SURGERY    . TEE WITHOUT CARDIOVERSION N/A 06/19/2015   Procedure: TRANSESOPHAGEAL ECHOCARDIOGRAM (TEE);  Surgeon: Laurey Morale, MD;  Location: [facility] ENDOSCOPY;  Service: Cardiovascular;  Laterality: N/A;  . VAGINAL  HYSTERECTOMY  1979   ovaries intact    Current Facility-Administered Medications  Medication Dose Route Frequency Provider Last Rate Last Dose  . ceFAZolin (ANCEF) IVPB 2g/100 mL premix  2 g Intravenous To SS-Surg [name], MD      . chlorhexidine (HIBICLENS) 4 % liquid 4 application  60 mL Topical Once [name], MD      . diltiazem  (CARDIZEM) 100 mg in dextrose 5 % 100 mL (1 mg/mL) infusion  5-15 mg/hr Intravenous To OR Kipp Brood, MD      . fentaNYL (SUBLIMAZE) 100 MCG/2ML injection           . lactated ringers infusion   Intravenous Continuous [name], MD 50 mL/hr at 04/25/17 1114    . midazolam (VERSED) 2 MG/2ML injection           . tranexamic acid (CYKLOKAPRON) 2,000 mg in sodium chloride 0.9 % 50 mL Topical Application  2,000 mg Topical To OR [name], MD       Allergies  Allergen Reactions  . Ace Inhibitors Cough  . Warfarin Sodium Other (See Comments)    DOSE RELATED PHARMACOLOGIC EFFECT "bleed out"  . Codeine Other (See Comments)    sedation  . Delsym [Dextromethorphan Polistirex Er] Other (See Comments)    dizziness  . Lactose Intolerance (Gi) Diarrhea  . Phenylephrine Palpitations and Other (See Comments)    Nasal spray- "likely increase in nasal congestion".   . Sulfamethoxazole-Trimethoprim Nausea And Vomiting    GI intolerance.  . Tramadol Other (See Comments)    Hallucinations, while on zoloft concurrently.      Social History   Tobacco Use  . Smoking status: Never Smoker  . Smokeless tobacco: Never Used  Substance Use Topics  . Alcohol use: No    Alcohol/week: 0.0 oz    Family History  Problem Relation Age of Onset  . Cancer Father   . Colon cancer Father        possible colon cancer  . Tuberculosis Mother   . Breast cancer Neg Hx      Review of Systems  Musculoskeletal: Positive for joint pain.       Left knee  All other systems reviewed and are negative.   Objective:  Physical Exam  Constitutional: She is oriented to person, place, and time. She appears well-developed and well-nourished.  HENT:  Head: Normocephalic and atraumatic.  Eyes: Pupils are equal, round, and reactive to light.  Neck: Normal range of motion.  Cardiovascular: Normal rate.  Respiratory: Effort normal.  GI: Soft.  Musculoskeletal:  Left knee valgus deformity and painful ROM.  Tender to palpation along joint line.  Neurological: She is alert and oriented to person, place, and time.  Skin: Skin is warm and dry.  Psychiatric: She has a normal mood and affect. Her behavior is normal. Judgment and thought content normal.    Vital signs in last 24 hours: Temp:  [98.2 F (36.8 C)] 98.2 F (36.8 C) (01/15 0945) Pulse Rate:  [142] 142 (01/15 0945) Resp:  [20] 20 (01/15 0945) BP: (118)/(78) 118/78 (01/15 0945) SpO2:  [95 %] 95 % (01/15 0945)  Labs:   Estimated body mass index is 27.96 kg/m as calculated from the following:   Height as of 04/19/17: 5\' 5"  (1.651 m).   Weight as of 04/19/17: 76.2 kg (168 lb).   Imaging Review Plain radiographs demonstrate severe degenerative joint disease of the left knee(s). The overall alignment ismild valgus. The bone quality appears  to be good for age and reported activity level.  Assessment/Plan:  End stage primary arthritis, left knee   The patient history, physical examination, clinical judgment of the provider and imaging studies are consistent with end stage degenerative joint disease of the left knee(s) and total knee arthroplasty is deemed medically necessary. The treatment options including medical management, injection therapy arthroscopy and arthroplasty were discussed at length. The risks and benefits of total knee arthroplasty were presented and reviewed. The risks due to aseptic loosening, infection, stiffness, patella tracking problems, thromboembolic complications and other imponderables were discussed. The patient acknowledged the explanation, agreed to proceed with the plan and consent was signed. Patient is being admitted for inpatient treatment for surgery, pain control, PT, OT, prophylactic antibiotics, VTE prophylaxis, progressive ambulation and ADL's and discharge planning. The patient is planning to be discharged home with home health services

## 2017-04-26 ENCOUNTER — Encounter (HOSPITAL_COMMUNITY): Payer: Self-pay

## 2017-04-26 ENCOUNTER — Other Ambulatory Visit: Payer: Self-pay

## 2017-04-26 MED ORDER — POTASSIUM CHLORIDE CRYS ER 20 MEQ PO TBCR
40.0000 meq | EXTENDED_RELEASE_TABLET | Freq: Once | ORAL | Status: AC
  Administered 2017-04-26: 40 meq via ORAL
  Filled 2017-04-26: qty 2

## 2017-04-26 MED ORDER — ORAL CARE MOUTH RINSE
15.0000 mL | Freq: Two times a day (BID) | OROMUCOSAL | Status: DC
  Administered 2017-04-27 – 2017-05-06 (×8): 15 mL via OROMUCOSAL

## 2017-04-26 MED ORDER — ALPRAZOLAM 0.25 MG PO TABS
0.2500 mg | ORAL_TABLET | Freq: Two times a day (BID) | ORAL | Status: AC | PRN
  Administered 2017-04-26 (×2): 0.25 mg via ORAL
  Filled 2017-04-26 (×2): qty 1

## 2017-04-26 MED ORDER — METOPROLOL TARTRATE 25 MG PO TABS
25.0000 mg | ORAL_TABLET | Freq: Two times a day (BID) | ORAL | Status: DC
  Administered 2017-04-26 (×2): 25 mg via ORAL
  Filled 2017-04-26 (×2): qty 1

## 2017-04-26 NOTE — Care Management Note (Addendum)
Case Management Note  Patient Details  Name: [name] MRN: [id number] Date of Birth: November 29, 1930  Subjective/Objective:  S/p TKR, on cardizem drip,  From home, he is pre set up with [facility] for HHPT, and [facility] will bring his 3 n 1 and rolling walker to patient's room.   1/21 1628 Letha Cape RN, BSN - Heart rate still in upper 120's to 130's. Will start on amio drip, she became hypotensive and nauseous.  Will dc amio, start digoxin if this does not improve will need to start milrinone in order to durese per MD note.                   Action/Plan: NCM will follow for dc needs. She may need SNF.   Expected Discharge Date:                  Expected Discharge Plan:  Home w Home Health Services  In-House Referral:     Discharge planning Services  CM Consult  Post Acute Care Choice:    Choice offered to:     DME Arranged:   3 n 1, walker,rolling DME Agency:   [facility]  HH Arranged:   HHPT HH Agency:   [facility]  Status of Service:  In process, will continue to follow  If discussed at Long Length of Stay Meetings, dates discussed:    Additional Comments:  Leone Haven, RN 04/26/2017, 2:40 PM

## 2017-04-26 NOTE — Evaluation (Signed)
Physical Therapy Evaluation Patient Details Name: [name] MRN: [id number] DOB: 21-Feb-1931 Today's Date: 04/26/2017   History of Present Illness  84 year old female with chronic atrial fibrillation underwent L. total knee arthroplasty on 04/25/17.  PMH:   (with failed TEE CV 06/19/15), NICM with an EF of 50% per echo in 2017, chronic diastolic heart failure, LBBB, CHF.    Clinical Impression  Pt admitted with above diagnosis. Pt currently with functional limitations due to the deficits listed below (see PT Problem List). Pt did well with ambulation however HR incr to 130 bpm therefore let pt rest after 50 feet.  Pt should progress well once HR under control.  Will follow acutely.   Pt will benefit from skilled PT to increase their independence and safety with mobility to allow discharge to the venue listed below.      Follow Up Recommendations Home health PT;Supervision/Assistance - 24 hour    Equipment Recommendations  None recommended by PT    Recommendations for Other Services       Precautions / Restrictions Precautions Precautions: Fall Restrictions Weight Bearing Restrictions: No LLE Weight Bearing: Weight bearing as tolerated      Mobility  Bed Mobility Overal bed mobility: Needs Assistance Bed Mobility: Supine to Sit     Supine to sit: Min guard     General bed mobility comments: Was able to come to EOB without assist.    Transfers Overall transfer level: Needs assistance Equipment used: Rolling walker (2 wheeled) Transfers: Sit to/from Stand Sit to Stand: Min guard;+2 safety/equipment         General transfer comment: Pt needed cues for hand placement.  Pt was able to power up with steadying assist only.    Ambulation/Gait Ambulation/Gait assistance: Min guard;+2 safety/equipment Ambulation Distance (Feet): 50 Feet Assistive device: Rolling walker (2 wheeled) Gait Pattern/deviations: Decreased stride length;Step-to pattern;Decreased step length -  left;Decreased stance time - left;Decreased weight shift to left;Antalgic   Gait velocity interpretation: Below normal speed for age/gender General Gait Details: Pt was able to walk to hall and progress ambulation limited by incr HR after 50 feet.  Pt was sore but overall tolerated well if HR had not incr.  92-135 bpm.    Stairs            Wheelchair Mobility    Modified Rankin (Stroke Patients Only)       Balance Overall balance assessment: Needs assistance Sitting-balance support: No upper extremity supported;Feet supported Sitting balance-Leahy Scale: Fair     Standing balance support: Bilateral upper extremity supported;During functional activity Standing balance-Leahy Scale: Poor Standing balance comment: relies on UE support for balance.                              Pertinent Vitals/Pain Pain Assessment: Faces Faces Pain Scale: Hurts a little bit Pain Location: left knee Pain Descriptors / Indicators: Sore Pain Intervention(s): Limited activity within patient's tolerance;Monitored during session;Premedicated before session;Repositioned;Ice applied  92-135 bpm, 95% on 2LO2, 101/68 pre BP and 106/71 pre BP.    Home Living Family/patient expects to be discharged to:: Private residence Living Arrangements: Spouse/significant other Available Help at Discharge: Family;Available 24 hours/day Type of Home: House Home Access: Level entry     Home Layout: One level Home Equipment: Cane - single point;Walker - 2 wheels;Bedside commode;Shower seat;Grab bars - toilet;Grab bars - tub/shower      Prior Function Level of Independence: Independent with assistive  device(s)         Comments: used cane at times.  I  with B/D     Hand Dominance        Extremity/Trunk Assessment   Upper Extremity Assessment Upper Extremity Assessment: Defer to OT evaluation    Lower Extremity Assessment Lower Extremity Assessment: LLE deficits/detail LLE Deficits /  Details: ROM 6-92 degrees, strength grossly 3-/5    Cervical / Trunk Assessment Cervical / Trunk Assessment: Normal  Communication   Communication: No difficulties  Cognition Arousal/Alertness: Awake/alert Behavior During Therapy: WFL for tasks assessed/performed Overall Cognitive Status: Within Functional Limits for tasks assessed                                        General Comments      Exercises Total Joint Exercises Goniometric ROM: 6-92 degrees General Exercises - Lower Extremity Ankle Circles/Pumps: AROM;Both;10 reps;Supine Quad Sets: AROM;Both;10 reps;Supine Long Arc Quad: AROM;Both;10 reps;Seated Hip Flexion/Marching: AROM;Both;10 reps;Seated   Assessment/Plan    PT Assessment Patient needs continued PT services  PT Problem List Decreased activity tolerance;Decreased mobility;Decreased strength;Decreased range of motion;Decreased balance;Decreased knowledge of use of DME;Decreased safety awareness;Pain;Cardiopulmonary status limiting activity;Decreased knowledge of precautions       PT Treatment Interventions DME instruction;Gait training;Functional mobility training;Therapeutic activities;Therapeutic exercise;Balance training;Patient/family education    PT Goals (Current goals can be found in the Care Plan section)  Acute Rehab PT Goals Patient Stated Goal: to go home PT Goal Formulation: With patient Time For Goal Achievement: 05/10/17 Potential to Achieve Goals: Good    Frequency 7X/week   Barriers to discharge        Co-evaluation               AM-PAC PT "6 Clicks" Daily Activity  Outcome Measure Difficulty turning over in bed (including adjusting bedclothes, sheets and blankets)?: None Difficulty moving from lying on back to sitting on the side of the bed? : None Difficulty sitting down on and standing up from a chair with arms (e.g., wheelchair, bedside commode, etc,.)?: A Little Help needed moving to and from a bed to chair  (including a wheelchair)?: A Little Help needed walking in hospital room?: A Little Help needed climbing 3-5 steps with a railing? : Total 6 Click Score: 18    End of Session Equipment Utilized During Treatment: Gait belt;Oxygen Activity Tolerance: Patient limited by fatigue;Patient limited by pain Patient left: in chair;with call bell/phone within reach;with chair alarm set;with family/visitor present Nurse Communication: Mobility status PT Visit Diagnosis: Unsteadiness on feet (R26.81);Muscle weakness (generalized) (M62.81)    Time: 4696-2952 PT Time Calculation (min) (ACUTE ONLY): 22 min   Charges:   PT Evaluation $PT Eval Moderate Complexity: 1 Mod     PT G Codes:        Abimael Zeiter,PT Acute Rehabilitation [phone] (pager)   Berline Lopes 04/26/2017, 10:37 AM

## 2017-04-26 NOTE — Progress Notes (Signed)
Occupational Therapy Evaluation and Discharge Patient Details Name: [name] MRN: [id number] DOB: 1930/09/28 Today's Date: 04/26/2017    History of Present Illness Pt is an 84 year old female with chronic atrial fibrillation s/p L TKA on 04/25/17.  PMH includes failed TEE CV 06/19/15, NICM with an EF of 50% (echo 2017), chronic diastolic heart failure, LBBB, CHF.     Clinical Impression   PTA pt independent in ADL and mobility (with SPC PRN). Pt is currently min guard/set up for LB ADL with caregiver independent in assisting and min guard assist for transfers. HR from mid 90's to 125 during session. AE education complete with teach back for Pt and husband (present throughout session). No questions or concerns at the end of session for OT. Good shower and toilet set up at home and Pt with good safety awareness. Education complete OT to sign off, thank you for the opportunity to serve this patient.     Follow Up Recommendations  Supervision/Assistance - 24 hour    Equipment Recommendations  None recommended by OT(Pt has appropriate DME at home)    Recommendations for Other Services       Precautions / Restrictions Precautions Precautions: Fall;Knee Precaution Booklet Issued: Yes (comment) Precaution Comments: Verbally reviewed precautions and handout provided Restrictions Weight Bearing Restrictions: Yes LLE Weight Bearing: Weight bearing as tolerated      Mobility Bed Mobility               General bed mobility comments: Received sitting in chair  Transfers Overall transfer level: Needs assistance Equipment used: Rolling walker (2 wheeled) Transfers: Sit to/from Stand Sit to Stand: Supervision         General transfer comment: Good technique standing with RW. Poor eccentric control when sitting (secondary to fatigue), requiring cues for correct hand placement to lower into chair    Balance Overall balance assessment: Needs assistance Sitting-balance support:  No upper extremity supported;Feet supported Sitting balance-Leahy Scale: Fair     Standing balance support: Bilateral upper extremity supported;During functional activity Standing balance-Leahy Scale: Fair Standing balance comment: Can static stand with no UE support; reliant on UE support for dynamic balance                           ADL either performed or assessed with clinical judgement   ADL Overall ADL's : Needs assistance/impaired Eating/Feeding: Modified independent   Grooming: Set up;Sitting;With caregiver independent assisting   Upper Body Bathing: Set up;Sitting;With caregiver independent assisting   Lower Body Bathing: With adaptive equipment;Sit to/from stand;With caregiver independent assisting;Minimal assistance   Upper Body Dressing : Set up;With caregiver independent assisting   Lower Body Dressing: Minimal assistance;With caregiver independent assisting;With adaptive equipment;Sitting/lateral leans   Toilet Transfer: Min guard;Ambulation;RW   Toileting- Clothing Manipulation and Hygiene: Min guard;Sitting/lateral lean Toileting - Clothing Manipulation Details (indicate cue type and reason): manging hospital gown , talked about strategies for managing underwear and pants Tub/ Shower Transfer: Minimal assistance;With caregiver independent assisting;Ambulation;Walk-in shower;Rolling walker;Grab bars;Shower seat   Functional mobility during ADLs: Min guard;Rolling walker General ADL Comments: Pt and husband educated in AE for LB bathing/dressing. they verblized understanding and practiced with the sock donner.     Vision Patient Visual Report: No change from baseline       Perception     Praxis      Pertinent Vitals/Pain Pain Assessment: 0-10 Pain Score: 7  Faces Pain Scale: Hurts a little bit Pain Location:  L knee Pain Descriptors / Indicators: Sore Pain Intervention(s): Monitored during session;Premedicated before session     Hand  Dominance     Extremity/Trunk Assessment Upper Extremity Assessment Upper Extremity Assessment: Overall WFL for tasks assessed   Lower Extremity Assessment Lower Extremity Assessment: LLE deficits/detail LLE Deficits / Details: post op deficits in strenth and ROM as expected   Cervical / Trunk Assessment Cervical / Trunk Assessment: Normal   Communication Communication Communication: No difficulties   Cognition Arousal/Alertness: Awake/alert Behavior During Therapy: WFL for tasks assessed/performed Overall Cognitive Status: Within Functional Limits for tasks assessed                                 General Comments: End of session limited with pt stating, "I can barely keep my eyes open, but I can't sleep" (suspect due to medications)   General Comments  Husband and daughter present during session    Exercises Total Joint Exercises Ankle Circles/Pumps: AROM;Both;20 reps;Seated Quad Sets: AROM;Both;10 reps;Seated Towel Squeeze: AROM;Both;10 reps;Seated Straight Leg Raises: AROM;Left;5 reps;Seated   Shoulder Instructions      Home Living Family/patient expects to be discharged to:: Private residence Living Arrangements: Spouse/significant other Available Help at Discharge: Family;Available 24 hours/day Type of Home: House Home Access: Level entry     Home Layout: One level     Bathroom Shower/Tub: Producer, television/film/video: Standard Bathroom Accessibility: Yes How Accessible: Accessible via wheelchair Home Equipment: Cane - single point;Walker - 2 wheels;Bedside commode;Shower seat;Grab bars - toilet;Grab bars - tub/shower;Hand held shower head;Adaptive equipment Adaptive Equipment: Reacher;Long-handled sponge;Long-handled shoe horn        Prior Functioning/Environment Level of Independence: Independent with assistive device(s)        Comments: used cane at times.  I  with B/D        OT Problem List: Decreased strength;Decreased range  of motion;Decreased activity tolerance;Impaired balance (sitting and/or standing);Decreased knowledge of use of DME or AE;Pain      OT Treatment/Interventions:      OT Goals(Current goals can be found in the care plan section) Acute Rehab OT Goals Patient Stated Goal: to go home OT Goal Formulation: With patient/family Time For Goal Achievement: 05/10/17 Potential to Achieve Goals: Good  OT Frequency:     Barriers to D/C:            Co-evaluation              AM-PAC PT "6 Clicks" Daily Activity     Outcome Measure Help from another person eating meals?: None Help from another person taking care of personal grooming?: None(in seated position) Help from another person toileting, which includes using toliet, bedpan, or urinal?: A Little Help from another person bathing (including washing, rinsing, drying)?: A Little Help from another person to put on and taking off regular upper body clothing?: None Help from another person to put on and taking off regular lower body clothing?: A Little 6 Click Score: 21   End of Session Equipment Utilized During Treatment: Gait belt;Rolling walker Nurse Communication: Mobility status;Patient requests pain meds  Activity Tolerance: Patient tolerated treatment well Patient left: in chair;with call bell/phone within reach;with family/visitor present  OT Visit Diagnosis: Unsteadiness on feet (R26.81);Other abnormalities of gait and mobility (R26.89);Pain Pain - Right/Left: Left Pain - part of body: Knee                Time: 9604-5409 OT Time  Calculation (min): 16 min Charges:  OT General Charges $OT Visit: 1 Visit OT Evaluation $OT Eval Moderate Complexity: 1 Mod G-Codes:     Sherryl Manges OTR/L [phone]   [name] 04/26/2017, 2:56 PM

## 2017-04-26 NOTE — Progress Notes (Signed)
Progress Note  Patient Name: [name] Date of Encounter: 04/26/2017  Primary Cardiologist: No primary care provider on file.   Subjective   Mild knee pain doing well post op   Inpatient Medications    Scheduled Meds: . apixaban  2.5 mg Oral BID  . docusate sodium  100 mg Oral BID  . famotidine  20 mg Oral Daily  . HYDROcodone-acetaminophen      . mouth rinse  15 mL Mouth Rinse BID  . metoprolol tartrate  25 mg Oral BID  . sertraline  75 mg Oral Daily  . torsemide  20 mg Oral Daily   Continuous Infusions: . lactated ringers Stopped (04/26/17 0300)  . methocarbamol (ROBAXIN)  IV     PRN Meds: acetaminophen **OR** acetaminophen, alum & mag hydroxide-simeth, bisacodyl, diphenhydrAMINE, HYDROcodone-acetaminophen, HYDROcodone-acetaminophen, HYDROmorphone (DILAUDID) injection, menthol-cetylpyridinium **OR** phenol, methocarbamol **OR** methocarbamol (ROBAXIN)  IV, metoCLOPramide **OR** metoCLOPramide (REGLAN) injection, ondansetron **OR** ondansetron (ZOFRAN) IV   Vital Signs    Vitals:   04/25/17 2300 04/25/17 2330 04/26/17 0300 04/26/17 0700  BP: 96/66  111/79   Pulse: 87     Resp: 19  (!) 26   Temp: 97.8 F (36.6 C)  (!) 97 F (36.1 C) 98.5 F (36.9 C)  TempSrc: Oral  Axillary Oral  SpO2: (!) 89% 94% 94%   Weight:      Height:        Intake/Output Summary (Last 24 hours) at 04/26/2017 0904 Last data filed at 04/26/2017 0700 Gross per 24 hour  Intake 3280 ml  Output 475 ml  Net 2805 ml   Filed Weights   04/25/17 2235  Weight: 186 lb 4.6 oz (84.5 kg)    Telemetry    afib rates 80-90 - Personally Reviewed  ECG    AFib LBBB old  - Personally Reviewed  Physical Exam  Post left TKR  GEN: No acute distress.   Neck: No JVD Cardiac: RRR, no murmurs, rubs, or gallops.  Respiratory: Clear to auscultation bilaterally. GI: Soft, nontender, non-distended  MS: No edema; No deformity. Neuro:  Nonfocal  Psych: Normal affect   Labs     Chemistry Recent Labs  Lab 04/19/17 1030  NA 136  K 3.2*  CL 97*  CO2 29  GLUCOSE 96  BUN 21*  CREATININE 1.13*  CALCIUM 9.2  GFRNONAA 43*  GFRAA 50*  ANIONGAP 10     Hematology Recent Labs  Lab 04/19/17 1030  WBC 8.2  RBC 4.12  HGB 12.4  HCT 39.2  MCV 95.1  MCH 30.1  MCHC 31.6  RDW 13.7  PLT 276    Cardiac EnzymesNo results for input(s): TROPONINI in the last 168 hours. No results for input(s): TROPIPOC in the last 168 hours.   BNPNo results for input(s): BNP, PROBNP in the last 168 hours.   DDimer No results for input(s): DDIMER in the last 168 hours.   Radiology    No results found.  Cardiac Studies   06/19/15 EF 50%  Patient Profile     84 y.o. female with non ischemic DCM , No CAD chronic afib history of retroperitoneal Bleed on heparin post uncomplicated left TKR  Assessment & Plan    Afib:  Neo synephrine off will write for lopressor 25 bid and stop iv cardizem. Resume Home demedex and low dose eliquis. Ok to proceed with PT/OT and go to rehab / SNF Although patient indicates going home   For questions or updates, please contact [facility] Please  consult [email or web address] for contact info under Cardiology/STEMI.      Signed, Charlton Haws, MD  04/26/2017, 9:04 AM

## 2017-04-26 NOTE — Discharge Instructions (Addendum)
Information on my medicine - ELIQUIS (apixaban)  Why was Eliquis prescribed for you? Eliquis was prescribed for you to reduce the risk of blood clots forming after orthopedic surgery.    What do You need to know about Eliquis? Take your Eliquis TWICE DAILY - one tablet in the morning and one tablet in the evening with or without food.  It would be best to take the dose about the same time each day.  If you have difficulty swallowing the tablet whole please discuss with your pharmacist how to take the medication safely.  Take Eliquis exactly as prescribed by your doctor and DO NOT stop taking Eliquis without talking to the doctor who prescribed the medication.  Stopping without other medication to take the place of Eliquis may increase your risk of developing a clot.  After discharge, you should have regular check-up appointments with your healthcare provider that is prescribing your Eliquis.  What do you do if you miss a dose? If a dose of ELIQUIS is not taken at the scheduled time, take it as soon as possible on the same day and twice-daily administration should be resumed.  The dose should not be doubled to make up for a missed dose.  Do not take more than one tablet of ELIQUIS at the same time.  Important Safety Information A possible side effect of Eliquis is bleeding. You should call your healthcare provider right away if you experience any of the following: ? Bleeding from an injury or your nose that does not stop. ? Unusual colored urine (red or dark brown) or unusual colored stools (red or black). ? Unusual bruising for unknown reasons. ? A serious fall or if you hit your head (even if there is no bleeding).  Some medicines may interact with Eliquis and might increase your risk of bleeding or clotting while on Eliquis. To help avoid this, consult your healthcare provider or pharmacist prior to using any new prescription or non-prescription medications, including herbals,  vitamins, non-steroidal anti-inflammatory drugs (NSAIDs) and supplements.  This website has more information on Eliquis (apixaban): http://[email or web address].com/eliquis/home   Please monitor your weight daily and call if:  Weight up 3 lbs in one day, increased swelling or increased dyspnea.

## 2017-04-26 NOTE — Progress Notes (Signed)
Subjective: 1 Day Post-Op Procedure(s) (LRB): TOTAL KNEE ARTHROPLASTY (Left)   Patietn resting comfortably in bed. She says that her pain level is a 2 at most. She is able to lift up her leg and move it without any significant discomfort.   Activity level:  wbat Diet tolerance:  ok Voiding:  ok Patient reports pain as mild.    Objective: Vital signs in last 24 hours: Temp:  [97 F (36.1 C)-98.5 F (36.9 C)] 98.5 F (36.9 C) (01/16 0700) Pulse Rate:  [49-142] 87 (01/15 2300) Resp:  [12-26] 26 (01/16 0300) BP: (90-118)/(64-82) 111/79 (01/16 0300) SpO2:  [89 %-100 %] 94 % (01/16 0300) Weight:  [84.5 kg (186 lb 4.6 oz)] 84.5 kg (186 lb 4.6 oz) (01/15 2235)  Labs: No results for input(s): HGB in the last 72 hours. No results for input(s): WBC, RBC, HCT, PLT in the last 72 hours. No results for input(s): NA, K, CL, CO2, BUN, CREATININE, GLUCOSE, CALCIUM in the last 72 hours. Recent Labs    04/25/17 1030  INR 1.13    Physical Exam:  Neurologically intact ABD soft Neurovascular intact Sensation intact distally Intact pulses distally Dorsiflexion/Plantar flexion intact Incision: dressing C/D/I and no drainage No cellulitis present Compartment soft  Assessment/Plan:  1 Day Post-Op Procedure(s) (LRB): TOTAL KNEE ARTHROPLASTY (Left) Advance diet Up with therapy  We greatly appreciate cardiology assistance in management of this patient.  I will remove ACE wrap tomorrow. Resume home eliquis this morning. We will decide Home with HHPT vs SNF depending on how she does with PT and medical management. Follow up in the office 2 weeks post op.   Jaylea Plourde, Ginger Organ 04/26/2017, 8:01 AM

## 2017-04-26 NOTE — Progress Notes (Signed)
Physical Therapy Treatment Patient Details Name: [name] MRN: [id number] DOB: 12-26-1930 Today's Date: 04/26/2017    History of Present Illness Pt is an 84 year old female with chronic atrial fibrillation s/p L TKA on 04/25/17.  PMH includes failed TEE CV 06/19/15, NICM with an EF of 50% (echo 2017), chronic diastolic heart failure, LBBB, CHF.     PT Comments    Initiated LE therex this session, and pt with improved ambulation distance using RW and intermittent min guard. Session limited by c/o dizziness while walking (see BP values below) and pt significantly fatigued upon return to room. HR 94-123 and SpO2 >90% on RA. Will continue to follow acutely.  Resting BP 112/78 Post-amb BP 94/69 Sitting ~2 min BP 106/67   Follow Up Recommendations  Home health PT;Supervision/Assistance - 24 hour     Equipment Recommendations  None recommended by PT    Recommendations for Other Services       Precautions / Restrictions Precautions Precautions: Fall;Knee Precaution Booklet Issued: Yes (comment) Precaution Comments: Verbally reviewed precautions and handout provided Restrictions Weight Bearing Restrictions: Yes LLE Weight Bearing: Weight bearing as tolerated    Mobility  Bed Mobility               General bed mobility comments: Received sitting in chair  Transfers Overall transfer level: Needs assistance Equipment used: Rolling walker (2 wheeled) Transfers: Sit to/from Stand Sit to Stand: Supervision         General transfer comment: Good technique standing with RW. Poor eccentric control when sitting (secondary to fatigue), requiring cues for correct hand placement to lower into chair  Ambulation/Gait Ambulation/Gait assistance: Min guard Ambulation Distance (Feet): 100 Feet Assistive device: Rolling walker (2 wheeled) Gait Pattern/deviations: Step-through pattern;Decreased stride length;Antalgic Gait velocity: Decreased Gait velocity interpretation: <1.8  ft/sec, indicative of risk for recurrent falls General Gait Details: Slow, controlled amb with RW and intermittent min guard for safety. HR up to 123 while walking. Pt c/o dizziness limiting further distance, with BP at 94/69 upon return to room   Stairs            Wheelchair Mobility    Modified Rankin (Stroke Patients Only)       Balance Overall balance assessment: Needs assistance Sitting-balance support: No upper extremity supported;Feet supported Sitting balance-Leahy Scale: Fair     Standing balance support: Bilateral upper extremity supported;During functional activity Standing balance-Leahy Scale: Fair Standing balance comment: Can static stand with no UE support; reliant on UE support for dynamic balance                            Cognition Arousal/Alertness: Awake/alert Behavior During Therapy: WFL for tasks assessed/performed Overall Cognitive Status: Within Functional Limits for tasks assessed                                 General Comments: End of session limited with pt stating, "I can barely keep my eyes open, but I can't sleep" (suspect due to medications)      Exercises Total Joint Exercises Ankle Circles/Pumps: AROM;Both;20 reps;Seated Quad Sets: AROM;Both;10 reps;Seated Towel Squeeze: AROM;Both;10 reps;Seated Straight Leg Raises: AROM;Left;5 reps;Seated    General Comments General comments (skin integrity, edema, etc.): Husband and daughter present during session      Pertinent Vitals/Pain Pain Assessment: (P) 0-10 Pain Score: (P) 7  Faces Pain Scale: Hurts a little  bit Pain Location: L knee Pain Descriptors / Indicators: Sore Pain Intervention(s): Monitored during session;Premedicated before session    Home Living Family/patient expects to be discharged to:: (P) Private residence Living Arrangements: (P) Spouse/significant other Available Help at Discharge: (P) Family;Available 24 hours/day Type of Home: (P)  House Home Access: (P) Level entry   Home Layout: (P) One level Home Equipment: (P) Cane - single point;Walker - 2 wheels;Bedside commode;Shower seat;Grab bars - toilet;Grab bars - tub/shower;Hand held shower head;Adaptive equipment      Prior Function Level of Independence: (P) Independent with assistive device(s)      Comments: (P) used cane at times.  I  with B/D   PT Goals (current goals can now be found in the care plan section) Acute Rehab PT Goals Patient Stated Goal: to go home PT Goal Formulation: With patient Time For Goal Achievement: 05/10/17 Potential to Achieve Goals: Good Progress towards PT goals: Progressing toward goals    Frequency    7X/week      PT Plan Current plan remains appropriate    Co-evaluation              AM-PAC PT "6 Clicks" Daily Activity  Outcome Measure  Difficulty turning over in bed (including adjusting bedclothes, sheets and blankets)?: None Difficulty moving from lying on back to sitting on the side of the bed? : None Difficulty sitting down on and standing up from a chair with arms (e.g., wheelchair, bedside commode, etc,.)?: None Help needed moving to and from a bed to chair (including a wheelchair)?: A Little Help needed walking in hospital room?: A Little Help needed climbing 3-5 steps with a railing? : A Little 6 Click Score: 21    End of Session Equipment Utilized During Treatment: Gait belt Activity Tolerance: Patient tolerated treatment well Patient left: in chair;with call bell/phone within reach;with family/visitor present Nurse Communication: Mobility status PT Visit Diagnosis: Unsteadiness on feet (R26.81);Muscle weakness (generalized) (M62.81)     Time: 1610-9604 PT Time Calculation (min) (ACUTE ONLY): 20 min  Charges:  $Gait Training: 8-22 mins                    G Codes:      Ina Homes, PT, DPT Acute Rehab Services  Pager: [phone]  Malachy Chamber 04/26/2017, 2:52 PM

## 2017-04-27 ENCOUNTER — Encounter (HOSPITAL_COMMUNITY): Payer: Self-pay | Admitting: Orthopaedic Surgery

## 2017-04-27 ENCOUNTER — Inpatient Hospital Stay (HOSPITAL_COMMUNITY): Payer: Medicare Other

## 2017-04-27 DIAGNOSIS — J9600 Acute respiratory failure, unspecified whether with hypoxia or hypercapnia: Secondary | ICD-10-CM

## 2017-04-27 LAB — RENAL FUNCTION PANEL
Albumin: 3.3 g/dL — ABNORMAL LOW (ref 3.5–5.0)
Anion gap: 14 (ref 5–15)
BUN: 29 mg/dL — ABNORMAL HIGH (ref 6–20)
CO2: 21 mmol/L — ABNORMAL LOW (ref 22–32)
Calcium: 9.1 mg/dL (ref 8.9–10.3)
Chloride: 92 mmol/L — ABNORMAL LOW (ref 101–111)
Creatinine, Ser: 1.64 mg/dL — ABNORMAL HIGH (ref 0.44–1.00)
GFR calc Af Amer: 32 mL/min — ABNORMAL LOW (ref >60)
GFR calc non Af Amer: 28 mL/min — ABNORMAL LOW (ref >60)
Glucose, Bld: 166 mg/dL — ABNORMAL HIGH (ref 65–99)
Phosphorus: 3.4 mg/dL (ref 2.5–4.6)
Potassium: 4.5 mmol/L (ref 3.5–5.1)
Sodium: 127 mmol/L — ABNORMAL LOW (ref 135–145)

## 2017-04-27 LAB — MAGNESIUM: Magnesium: 2.1 mg/dL (ref 1.7–2.4)

## 2017-04-27 LAB — HEMOGLOBIN AND HEMATOCRIT, BLOOD
HCT: 35.7 % — ABNORMAL LOW (ref 36.0–46.0)
Hemoglobin: 11.6 g/dL — ABNORMAL LOW (ref 12.0–15.0)

## 2017-04-27 LAB — BLOOD GAS, ARTERIAL
Acid-Base Excess: 1.1 mmol/L (ref 0.0–2.0)
Bicarbonate: 24.3 mmol/L (ref 20.0–28.0)
Drawn by: 313941
O2 Content: 2 L/min
O2 Saturation: 97.2 %
Patient temperature: 97.3
pCO2 arterial: 31.6 mmHg — ABNORMAL LOW (ref 32.0–48.0)
pH, Arterial: 7.495 — ABNORMAL HIGH (ref 7.350–7.450)
pO2, Arterial: 84.7 mmHg (ref 83.0–108.0)

## 2017-04-27 LAB — HEMOGLOBIN A1C
Hgb A1c MFr Bld: 5.3 % (ref 4.8–5.6)
Mean Plasma Glucose: 105.41 mg/dL

## 2017-04-27 LAB — GLUCOSE, CAPILLARY
Glucose-Capillary: 197 mg/dL — ABNORMAL HIGH (ref 65–99)
Glucose-Capillary: 150 mg/dL — ABNORMAL HIGH (ref 65–99)
Glucose-Capillary: 147 mg/dL — ABNORMAL HIGH (ref 65–99)

## 2017-04-27 LAB — BRAIN NATRIURETIC PEPTIDE: B Natriuretic Peptide: 3961.8 pg/mL — ABNORMAL HIGH (ref 0.0–100.0)

## 2017-04-27 MED ORDER — METOPROLOL TARTRATE 12.5 MG HALF TABLET
12.5000 mg | ORAL_TABLET | Freq: Once | ORAL | Status: AC
  Administered 2017-04-27: 12.5 mg via ORAL
  Filled 2017-04-27: qty 1

## 2017-04-27 MED ORDER — METOPROLOL TARTRATE 25 MG PO TABS
25.0000 mg | ORAL_TABLET | Freq: Three times a day (TID) | ORAL | Status: DC
  Administered 2017-04-27 – 2017-04-30 (×9): 25 mg via ORAL
  Filled 2017-04-27 (×2): qty 1
  Filled 2017-04-27: qty 2
  Filled 2017-04-27 (×4): qty 1
  Filled 2017-04-27: qty 2
  Filled 2017-04-27 (×2): qty 1

## 2017-04-27 NOTE — Progress Notes (Signed)
   Progress Note  Patient Name: [name] Date of Encounter: 04/27/2017  Primary Cardiologist: No primary care provider on file.   Subjective   Her depression will get in way of her rehab Complains of being weak and exhausted No cardiac issues   Inpatient Medications    Scheduled Meds: . apixaban  2.5 mg Oral BID  . docusate sodium  100 mg Oral BID  . famotidine  20 mg Oral Daily  . mouth rinse  15 mL Mouth Rinse BID  . metoprolol tartrate  25 mg Oral TID  . sertraline  75 mg Oral Daily  . torsemide  20 mg Oral Daily   Continuous Infusions: . lactated ringers Stopped (04/26/17 0300)   PRN Meds: acetaminophen **OR** acetaminophen, alum & mag hydroxide-simeth, bisacodyl, diphenhydrAMINE, HYDROmorphone (DILAUDID) injection, menthol-cetylpyridinium **OR** phenol, metoCLOPramide **OR** metoCLOPramide (REGLAN) injection, ondansetron **OR** ondansetron (ZOFRAN) IV   Vital Signs    Vitals:   04/27/17 0000 04/27/17 0100 04/27/17 0400 04/27/17 0422  BP: 99/75 97/63 100/85   Pulse:  (!) 107 (!) 116   Resp: 20  16   Temp: 98.2 F (36.8 C)  97.9 F (36.6 C) 97.9 F (36.6 C)  TempSrc: Oral  Oral   SpO2:  94% 98%   Weight:      Height:        Intake/Output Summary (Last 24 hours) at 04/27/2017 0814 Last data filed at 04/27/2017 0537 Gross per 24 hour  Intake 152.33 ml  Output 150 ml  Net 2.33 ml   Filed Weights   04/25/17 2235  Weight: 186 lb 4.6 oz (84.5 kg)    Telemetry    Afib up to 130's with standing  - Personally Reviewed  ECG    AFib LBBB chronic  - Personally Reviewed  Physical Exam  Pale elderly female  GEN: No acute distress.   Neck: No JVD Cardiac: RRR, no murmurs, rubs, or gallops.  Respiratory: Clear to auscultation bilaterally. GI: Soft, nontender, non-distended  MS: No edema; No deformity. Neuro:  Nonfocal  Psych: Normal affect  Post left TKR   Labs    ChemistryNo results for input(s): NA, K, CL, CO2, GLUCOSE, BUN, CREATININE,  CALCIUM, PROT, ALBUMIN, AST, ALT, ALKPHOS, BILITOT, GFRNONAA, GFRAA, ANIONGAP in the last 168 hours.   HematologyNo results for input(s): WBC, RBC, HGB, HCT, MCV, MCH, MCHC, RDW, PLT in the last 168 hours.  Cardiac EnzymesNo results for input(s): TROPONINI in the last 168 hours. No results for input(s): TROPIPOC in the last 168 hours.   BNPNo results for input(s): BNP, PROBNP in the last 168 hours.   DDimer No results for input(s): DDIMER in the last 168 hours.   Radiology    No results found.  Cardiac Studies   None   Patient Profile     84 y.o. 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 Afib increase lopressor to tid continue low dose eliquis 2.5 bid 2. DCM back on home dose demedex I/O's even   For questions or updates, please contact [facility] Please consult [email or web address] for contact info under Cardiology/STEMI.      Signed, Charlton Haws, MD  04/27/2017, 8:14 AM

## 2017-04-27 NOTE — Consult Note (Signed)
Medical Consultation   [name]  WUJ:[id number]  DOB: [birth year]  DOA: 04/25/2017  PCP: [name], MD   Outpatient Specialists:   Requesting physician: Christain Sacramento, MD  Reason for consultation: Respiratory failure/SOB requiring supplemental Oxygen  History of Present Illness: [name] is an 84 y.o. Caucasian female with past medical history significant for CHF, possibly combined systolic and diastolic; atrial fibrillation with RVR currently being managed by Cardiology team, severe left knee DJD for which the patient underwent left total knee arthroplasty on 04/25/2017. Post op, patient has been in atrial fibrillation with rapid ventricular response and the Cardiology team is managing. Patient has been requiring supplemental oxygen, especially with ambulation to maintain Oxygen saturation greater than 91%. No pleuritic chest pain. Patient denies orthopnea, and no significant leg edema, though, patient has been on Torsemide. No history of Tobacco use or exposure to second hand smoking. CXR done on 04/19/17 was interpreted as hyperinflated lungs with chronic bronchitic and interstitial changes ("question fibrosis"), enlarged cardiac silhouette with slight pulmonary vascular congestion. No recent cardiac BNP. No fever or chills, no cough, no headache, no neck pain, no chest pain, no GI symptoms and no urinary symptoms. Patient does not use the incentive spirometry quite often.  Hospitalist team was consulted to assist with the respiratory issues. Elevated blood sugar was reported by the Orthopedic team.  Review of Systems:  ROS As per HPI otherwise 10 point review of systems negative.    Past Medical History: Past Medical History:  Diagnosis Date  . Anxiety   . Arthritis    "knees" (06/18/2015)  . Atrial fibrillation (HCC)    h/o sig bleed on coumadin  . Atrial fibrillation with RVR (HCC) 06/18/2015  . CHF (congestive heart failure) (HCC)   . Complication  of anesthesia 2010   "w/cardiac cath; got into a psychotic state for ~ 3 days; got me out w/Valium"  . Depression    "I have it off and on; not as often as I've gotten older" (06/18/2015)  . Diverticulosis    descending and sigmoid colon--Dr. Jarold Motto  . DJD (degenerative joint disease) of knee    left  . Dysrhythmia   . GERD (gastroesophageal reflux disease)   . Insomnia     off chronic ambien 5mg  as of 10/11, rare/episodic use since then  DCM/CHF  . Nonischemic cardiomyopathy (HCC)    normal coronaries, EF 15-20% 04/2008, EF 50-55% 2015  . Osteoporosis    on DXA 05/2010  . Sciatica    Right    Past Surgical History: Past Surgical History:  Procedure Laterality Date  . CARDIAC CATHETERIZATION  2010  . CARDIOVERSION N/A 06/19/2015   Procedure: CARDIOVERSION;  Surgeon: Laurey Morale, MD;  Location: [facility] ENDOSCOPY;  Service: Cardiovascular;  Laterality: N/A;  . CATARACT EXTRACTION W/ INTRAOCULAR LENS  IMPLANT, BILATERAL Bilateral   . DILATION AND CURETTAGE OF UTERUS    . EYE SURGERY    . TEE WITHOUT CARDIOVERSION N/A 06/19/2015   Procedure: TRANSESOPHAGEAL ECHOCARDIOGRAM (TEE);  Surgeon: Laurey Morale, MD;  Location: [facility] ENDOSCOPY;  Service: Cardiovascular;  Laterality: N/A;  . TOTAL KNEE ARTHROPLASTY Left 04/25/2017   Procedure: TOTAL KNEE ARTHROPLASTY;  Surgeon: [name], MD;  Location: [facility] OR;  Service: Orthopedics;  Laterality: Left;  [name] VAGINAL HYSTERECTOMY  1979   ovaries intact     Allergies:   Allergies  Allergen Reactions  . Ace Inhibitors  Cough  . Warfarin Sodium Other (See Comments)    DOSE RELATED PHARMACOLOGIC EFFECT "bleed out"  . Codeine Other (See Comments)    sedation  . Delsym [Dextromethorphan Polistirex Er] Other (See Comments)    dizziness  . Lactose Intolerance (Gi) Diarrhea  . Phenylephrine Palpitations and Other (See Comments)    Nasal spray- "likely increase in nasal congestion".   . Sulfamethoxazole-Trimethoprim Nausea And Vomiting    GI  intolerance.  . Tramadol Other (See Comments)    Hallucinations, while on zoloft concurrently.       Social History:  reports that  has never smoked. she has never used smokeless tobacco. She reports that she does not drink alcohol or use drugs.   Family History: Family History  Problem Relation Age of Onset  . Cancer Father   . Colon cancer Father        possible colon cancer  . Tuberculosis Mother   . Breast cancer Neg Hx      Physical Exam: Vitals:   04/27/17 0422 04/27/17 0700 04/27/17 1208 04/27/17 1500  BP:    113/79  Pulse:    (!) 124  Resp:    (!) 23  Temp: 97.9 F (36.6 C) 97.9 F (36.6 C) (!) 97.5 F (36.4 C) (!) 97.3 F (36.3 C)  TempSrc:  Oral Oral Axillary  SpO2:    99%  Weight:      Height:        Constitutional: Appearance,  Alert and awake, oriented x3, not in any acute distress. Eyes: PERLA, EOMI, irises appear normal, anicteric sclera,  ENMT: external ears and nose appear normal,             Lips appears normal, oropharynx mucosa, tongue, posterior pharynx appear normal  Neck: neck appears normal, no masses, normal ROM, no thyromegaly, no JVD  CVS: S1-S2 clear, no murmur rubs or gallops, no LE edema, normal pedal pulses  Respiratory:  clear to auscultation bilaterally, no wheezing, rales or rhonchi. Respiratory effort normal. No accessory muscle use.  Abdomen: soft nontender, nondistended, normal bowel sounds, no hepatosplenomegaly, no hernias  Musculoskeletal: : no cyanosis, clubbing or edema noted bilaterally                       Neuro: Cranial nerves II-XII intact, strength, sensation, reflexes Psych: judgement and insight appear normal, stable mood and affect, mental status Skin: no rashes or lesions or ulcers, no induration or nodules   Data reviewed:  I have personally reviewed following labs and imaging studies Labs:  CBC: Recent Labs  Lab 04/27/17 1632  HGB 11.6*  HCT 35.7*    Basic Metabolic Panel: No results for input(s): NA,  K, CL, CO2, GLUCOSE, BUN, CREATININE, CALCIUM, MG, PHOS in the last 168 hours. GFR Estimated Creatinine Clearance: 39.8 mL/min (A) (by C-G formula based on SCr of 1.13 mg/dL (H)). Liver Function Tests: No results for input(s): AST, ALT, ALKPHOS, BILITOT, PROT, ALBUMIN in the last 168 hours. No results for input(s): LIPASE, AMYLASE in the last 168 hours. No results for input(s): AMMONIA in the last 168 hours. Coagulation profile Recent Labs  Lab 04/25/17 1030  INR 1.13    Cardiac Enzymes: No results for input(s): CKTOTAL, CKMB, CKMBINDEX, TROPONINI in the last 168 hours. BNP: Invalid input(s): POCBNP CBG: Recent Labs  Lab 04/27/17 0751 04/27/17 1207  GLUCAP 197* 150*   D-Dimer No results for input(s): DDIMER in the last 72 hours. Hgb A1c No results for input(s):  HGBA1C in the last 72 hours. Lipid Profile No results for input(s): CHOL, HDL, LDLCALC, TRIG, CHOLHDL, LDLDIRECT in the last 72 hours. Thyroid function studies No results for input(s): TSH, T4TOTAL, T3FREE, THYROIDAB in the last 72 hours.  Invalid input(s): FREET3 Anemia work up No results for input(s): VITAMINB12, FOLATE, FERRITIN, TIBC, IRON, RETICCTPCT in the last 72 hours. Urinalysis    Component Value Date/Time   COLORURINE YELLOW 04/19/2017 1027   APPEARANCEUR CLEAR 04/19/2017 1027   LABSPEC 1.006 04/19/2017 1027   PHURINE 6.0 04/19/2017 1027   GLUCOSEU NEGATIVE 04/19/2017 1027   HGBUR NEGATIVE 04/19/2017 1027   HGBUR moderate 06/01/2010 1102   BILIRUBINUR NEGATIVE 04/19/2017 1027   BILIRUBINUR Neg 07/10/2015 1216   KETONESUR NEGATIVE 04/19/2017 1027   PROTEINUR NEGATIVE 04/19/2017 1027   UROBILINOGEN 4.0 07/10/2015 1216   UROBILINOGEN 0.2 06/01/2010 1102   NITRITE NEGATIVE 04/19/2017 1027   LEUKOCYTESUR NEGATIVE 04/19/2017 1027     Sepsis Labs Invalid input(s): PROCALCITONIN,  WBC,  LACTICIDVEN Microbiology Recent Results (from the past 240 hour(s))  Surgical pcr screen     Status: None    Collection Time: 04/19/17 10:27 AM  Result Value Ref Range Status   MRSA, PCR NEGATIVE NEGATIVE Final   Staphylococcus aureus NEGATIVE NEGATIVE Final    Comment: (NOTE) The Xpert SA Assay (FDA approved for NASAL specimens in patients 1 years of age and older), is one component of a comprehensive surveillance program. It is not intended to diagnose infection nor to guide or monitor treatment.        Inpatient Medications:   Scheduled Meds: . apixaban  2.5 mg Oral BID  . docusate sodium  100 mg Oral BID  . famotidine  20 mg Oral Daily  . mouth rinse  15 mL Mouth Rinse BID  . metoprolol tartrate  25 mg Oral TID  . sertraline  75 mg Oral Daily  . torsemide  20 mg Oral Daily   Continuous Infusions: . lactated ringers Stopped (04/26/17 0300)     Radiological Exams on Admission: No results found.  Impression/Plan:   Acute Respiratory failure, type uncertain for now/SOB   Possible CHF, combined, acute on chronic with exacerbation   Atrial fibrillation with RVR   Possible Atelectasis   Can not rule Thrombo embolic disease (DVT/PE)   Primary osteoarthritis of left knee S/P total knee arthroplasty   Possible previously undiagosed chronic lung disease/fibrosis   Reported elevated blood sugar      Will defer management and Afib RVR and possible CHF to Cardiology team Repeat CXR ABG Cardiac BNP Renal panel Low threshold to proceed with CTA Chest after reviewing renal function (Last BMP was done 04/19/17) Encourage frequent incentive spirometry Chest PT HbA1c and blood sugar monitoring  Further management will depend on above.  Thank you for this consultation.  Our [facility] hospitalist team will follow the patient with you.   Time Spent: 49 Minutes  Berton Mount, MD  [facility] Pager #: [phone] 7PM-7AM contact night coverage as above

## 2017-04-27 NOTE — Progress Notes (Signed)
Physical Therapy Treatment Patient Details Name: [name] MRN: [id number] DOB: October 23, 1930 Today's Date: 04/27/2017    History of Present Illness Pt is an 84 year old female with chronic atrial fibrillation s/p L TKA on 04/25/17.  PMH includes failed TEE CV 06/19/15, NICM with an EF of 50% (echo 2017), chronic diastolic heart failure, LBBB, CHF.      PT Comments    Patient tolerated gait distance of 68ft with+2 for chair follow due to continued fatigue. Pt with HR 110s-130s and SpO2 desat to 83% on RA while ambulating. Continue to progress as tolerated.    Follow Up Recommendations  Home health PT;Supervision/Assistance - 24 hour     Equipment Recommendations  None recommended by PT    Recommendations for Other Services       Precautions / Restrictions Precautions Precautions: Fall;Knee Precaution Booklet Issued: Yes (comment) Precaution Comments: precautions/positioning reviewed Restrictions Weight Bearing Restrictions: No LLE Weight Bearing: Weight bearing as tolerated    Mobility  Bed Mobility Overal bed mobility: Needs Assistance Bed Mobility: Supine to Sit     Supine to sit: Min assist     General bed mobility comments: assist to elevate trunk into sitting; cues for sequencing  Transfers Overall transfer level: Needs assistance Equipment used: Rolling walker (2 wheeled) Transfers: Sit to/from Stand Sit to Stand: Min assist         General transfer comment: assist to power up into standing and to gain balance upon standing; cues for safe hand placement  Ambulation/Gait Ambulation/Gait assistance: Min assist;+2 safety/equipment(chair follow) Ambulation Distance (Feet): 85 Feet Assistive device: Rolling walker (2 wheeled) Gait Pattern/deviations: Step-through pattern;Decreased stride length Gait velocity: decreased   General Gait Details: cues for sequencing, proximity to rW, and posture   Stairs            Wheelchair Mobility    Modified  Rankin (Stroke Patients Only)       Balance Overall balance assessment: Needs assistance Sitting-balance support: No upper extremity supported;Feet supported Sitting balance-Leahy Scale: Fair     Standing balance support: Bilateral upper extremity supported;During functional activity Standing balance-Leahy Scale: Poor                              Cognition Arousal/Alertness: Awake/alert Behavior During Therapy: WFL for tasks assessed/performed Overall Cognitive Status: Within Functional Limits for tasks assessed                                 General Comments: drowsy      Exercises Total Joint Exercises Ankle Circles/Pumps: AROM;Both;15 reps;Supine Quad Sets: AROM;Left;10 reps;Supine Towel Squeeze: AROM;Left;10 reps;Supine Short Arc Quad: AROM;Left;10 reps;Supine Heel Slides: AAROM;Left;10 reps;Supine Hip ABduction/ADduction: AROM;Left;10 reps;Supine Straight Leg Raises: AROM;Left;10 reps;Supine Goniometric ROM: 5-95degrees    General Comments General comments (skin integrity, edema, etc.): SpO2 desat to 83% on RA while ambulating; HR 110s-130s during session; BP in sitting 97/68 and 99/65 in standing      Pertinent Vitals/Pain Pain Assessment: Faces Faces Pain Scale: Hurts little more Pain Location: L knee Pain Descriptors / Indicators: Sore Pain Intervention(s): Limited activity within patient's tolerance;Monitored during session;Repositioned    Home Living                      Prior Function            PT Goals (current goals can now  be found in the care plan section) Acute Rehab PT Goals Patient Stated Goal: to go home Progress towards PT goals: Progressing toward goals    Frequency    7X/week      PT Plan Current plan remains appropriate    Co-evaluation              AM-PAC PT "6 Clicks" Daily Activity  Outcome Measure  Difficulty turning over in bed (including adjusting bedclothes, sheets and  blankets)?: None Difficulty moving from lying on back to sitting on the side of the bed? : Unable Difficulty sitting down on and standing up from a chair with arms (e.g., wheelchair, bedside commode, etc,.)?: Unable Help needed moving to and from a bed to chair (including a wheelchair)?: A Little Help needed walking in hospital room?: A Lot Help needed climbing 3-5 steps with a railing? : A Lot 6 Click Score: 13    End of Session Equipment Utilized During Treatment: Gait belt Activity Tolerance: Patient limited by fatigue Patient left: with call bell/phone within reach;with family/visitor present;in chair Nurse Communication: Mobility status PT Visit Diagnosis: Unsteadiness on feet (R26.81);Muscle weakness (generalized) (M62.81)     Time: 3149-7026 PT Time Calculation (min) (ACUTE ONLY): 33 min  Charges:  $Gait Training: 8-22 mins $Therapeutic Activity: 8-22 mins                    G Codes:       [name], PTA Pager: [phone]     Carolynne Edouard 04/27/2017, 2:44 PM

## 2017-04-27 NOTE — Progress Notes (Signed)
Patient resting periodically.  Patient exhibits some confusion.  Stated, "someone is trying to break into my house."  Reassurance and redirection given, reoriented to place.  Made comfortable.  Compression stocking on right leg removed for 30 minutes.  Safety and comfort maintained.  Will continue to monitor.  Bed alarm on. Call bell within reach.

## 2017-04-27 NOTE — Progress Notes (Addendum)
Physical Therapy Treatment Patient Details Name: [name] MRN: [id number] DOB: 06/01/1930 Today's Date: 04/27/2017    History of Present Illness Pt is an 84 year old female with chronic atrial fibrillation s/p L TKA on 04/25/17.  PMH includes failed TEE CV 06/19/15, NICM with an EF of 50% (echo 2017), chronic diastolic heart failure, LBBB, CHF.      PT Comments    Pt admitted with above diagnosis. Pt currently with functional limitations due to balance and endurance deficits. Pt was only able to exercise today as feeling bad and incr HR 130 at rest.  Pt did well with exercises.  O2 in place as pt DOE 3/4 at times.   Pt will benefit from skilled PT to increase their independence and safety with mobility to allow discharge to the venue listed below.     Follow Up Recommendations  Home health PT;Supervision/Assistance - 24 hour     Equipment Recommendations  None recommended by PT    Recommendations for Other Services       Precautions / Restrictions Precautions Precautions: Fall;Knee Precaution Booklet Issued: Yes (comment) Precaution Comments: Verbally reviewed precautions and handout provided Restrictions Weight Bearing Restrictions: No LLE Weight Bearing: Weight bearing as tolerated    Mobility  Bed Mobility               General bed mobility comments: HR up to 130 -131 on arrival.  Exercise only.   Transfers                    Ambulation/Gait                 Stairs            Wheelchair Mobility    Modified Rankin (Stroke Patients Only)       Balance                                            Cognition Arousal/Alertness: Awake/alert Behavior During Therapy: WFL for tasks assessed/performed Overall Cognitive Status: Within Functional Limits for tasks assessed                                 General Comments: End of session limited with pt stating, "I can barely keep my eyes open, but I can't  sleep" (suspect due to medications)      Exercises Total Joint Exercises Ankle Circles/Pumps: AROM;Both;15 reps;Supine Quad Sets: AROM;Left;10 reps;Supine Towel Squeeze: AROM;Left;10 reps;Supine Short Arc Quad: AROM;Left;10 reps;Supine Heel Slides: AAROM;Left;10 reps;Supine Hip ABduction/ADduction: AROM;Left;10 reps;Supine Straight Leg Raises: AROM;Left;10 reps;Supine Goniometric ROM: 5-95degrees  Husband educated in assisting pt with exercises    General Comments General comments (skin integrity, edema, etc.): Husband present      Pertinent Vitals/Pain Pain Assessment: Faces Faces Pain Scale: Hurts even more Pain Location: L knee Pain Descriptors / Indicators: Sore Pain Intervention(s): Limited activity within patient's tolerance;Monitored during session;Premedicated before session;Repositioned;Ice applied    Home Living                      Prior Function            PT Goals (current goals can now be found in the care plan section) Acute Rehab PT Goals Patient Stated Goal: to go home Progress towards PT goals: Progressing  toward goals    Frequency    7X/week      PT Plan Current plan remains appropriate    Co-evaluation              AM-PAC PT "6 Clicks" Daily Activity  Outcome Measure  Difficulty turning over in bed (including adjusting bedclothes, sheets and blankets)?: None Difficulty moving from lying on back to sitting on the side of the bed? : None Difficulty sitting down on and standing up from a chair with arms (e.g., wheelchair, bedside commode, etc,.)?: None Help needed moving to and from a bed to chair (including a wheelchair)?: A Little Help needed walking in hospital room?: Total Help needed climbing 3-5 steps with a railing? : A Little 6 Click Score: 19    End of Session Equipment Utilized During Treatment: Gait belt Activity Tolerance: Patient limited by fatigue(limited by incr HR) Patient left: with call bell/phone within  reach;with family/visitor present;in bed Nurse Communication: Mobility status PT Visit Diagnosis: Unsteadiness on feet (R26.81);Muscle weakness (generalized) (M62.81)     Time: 0211-1552 PT Time Calculation (min) (ACUTE ONLY): 13 min  Charges:  $Therapeutic Exercise: 8-22 mins                    G Codes:       Brewer Hitchman,PT Acute Rehabilitation [phone] (pager)    Berline Lopes 04/27/2017, 12:19 PM

## 2017-04-27 NOTE — Progress Notes (Signed)
Subjective: 2 Days Post-Op Procedure(s) (LRB): TOTAL KNEE ARTHROPLASTY (Left)   Patient has no pain this morning but continues to be anxious. He husband is now here which is calming for the patient. She is up and walking well with PT. She would liek to go home as soon as it is safe for her.   Activity level:  wbat Diet tolerance:  ok Voiding:  ok Patient reports pain as mild.    Objective: Vital signs in last 24 hours: Temp:  [96.7 F (35.9 C)-98.2 F (36.8 C)] 97.9 F (36.6 C) (01/17 0422) Pulse Rate:  [107-124] 116 (01/17 0400) Resp:  [16-23] 16 (01/17 0400) BP: (97-114)/(63-96) 100/85 (01/17 0400) SpO2:  [94 %-98 %] 98 % (01/17 0400)  Labs: No results for input(s): HGB in the last 72 hours. No results for input(s): WBC, RBC, HCT, PLT in the last 72 hours. No results for input(s): NA, K, CL, CO2, BUN, CREATININE, GLUCOSE, CALCIUM in the last 72 hours. Recent Labs    04/25/17 1030  INR 1.13    Physical Exam:  Neurologically intact ABD soft Neurovascular intact Sensation intact distally Intact pulses distally Dorsiflexion/Plantar flexion intact Incision: dressing C/D/I and no drainage No cellulitis present Compartment soft  Assessment/Plan:  2 Days Post-Op Procedure(s) (LRB): TOTAL KNEE ARTHROPLASTY (Left) Advance diet Up with therapy Discharge home with home health once cleared by cardiology and doing well. From an orthopaedic standpoint she has been cleared We greatly appreciate their help and management with this patient. WE will stop all pain medication and muscle relaxors as they might be leading to increased confusion and aggitation. Hopefully she will be able to get to a regular room today and if he confusion clears she can go tomorrow morning. From an orthopaedic standpoint she is good to go home but we need to make sure that it is safe from a medical standpoint. Continue on home eliquis. We will continue to follow her closely.  Redell Nazir, Ginger Organ 04/27/2017, 7:12 AM

## 2017-04-27 NOTE — Progress Notes (Signed)
SATURATION QUALIFICATIONS: (This note is used to comply with regulatory documentation for home oxygen)  Patient Saturations on Room Air at Rest =91%  Patient Saturations on Room Air while Ambulating = 83%  Patient Saturations on 2 Liters of oxygen while Ambulating = 93%  Please briefly explain why patient needs home oxygen: Desaturation with movement

## 2017-04-27 NOTE — Progress Notes (Signed)
Patient is requesting Xanax for increased anxiety.  Patient has removed a PIV earlier in the shift, she has attempted to get out of bed numerous time.  Dr. Devota Pace oncall service called and made aware.  Nurse requesting reorder of Xanax.  Awaiting return call and orders at this time.

## 2017-04-27 NOTE — Progress Notes (Signed)
Patient in bed sleeping intermittently.  Oxygen 2 lpm via nasal canula in place.  Medications given per order, tylenol given for left knee discomfort.  Safety and comfort measures maintained, call bell within reach and bed alarm activated.  Will continue to monitor.

## 2017-04-28 ENCOUNTER — Inpatient Hospital Stay (HOSPITAL_COMMUNITY): Payer: Medicare Other

## 2017-04-28 DIAGNOSIS — I5020 Unspecified systolic (congestive) heart failure: Secondary | ICD-10-CM

## 2017-04-28 LAB — GLUCOSE, CAPILLARY
Glucose-Capillary: 143 mg/dL — ABNORMAL HIGH (ref 65–99)
Glucose-Capillary: 115 mg/dL — ABNORMAL HIGH (ref 65–99)

## 2017-04-28 MED ORDER — FUROSEMIDE 10 MG/ML IJ SOLN
40.0000 mg | Freq: Two times a day (BID) | INTRAMUSCULAR | Status: DC
  Administered 2017-04-28 (×2): 40 mg via INTRAVENOUS
  Filled 2017-04-28 (×2): qty 4

## 2017-04-28 MED ORDER — TECHNETIUM TC 99M DIETHYLENETRIAME-PENTAACETIC ACID
30.0000 | Freq: Once | INTRAVENOUS | Status: AC | PRN
  Administered 2017-04-28: 30 via RESPIRATORY_TRACT

## 2017-04-28 MED ORDER — TECHNETIUM TO 99M ALBUMIN AGGREGATED
4.0000 | Freq: Once | INTRAVENOUS | Status: AC | PRN
  Administered 2017-04-28: 4 via INTRAVENOUS

## 2017-04-28 NOTE — Care Management Important Message (Signed)
Important Message  Patient Details  Name: [name] MRN: [id number] Date of Birth: Mar 01, 1931   Medicare Important Message Given:  Yes    Fernado Brigante Stefan Church 04/28/2017, 1:54 PM

## 2017-04-28 NOTE — Progress Notes (Signed)
Progress Note  Patient Name: [name] Date of Encounter: 04/28/2017  Primary Cardiologist: No primary care provider on file.   Subjective   Dyspnea and soft BP BS are running high Medicine seeing  Depression significant Knee is fine   Inpatient Medications    Scheduled Meds: . apixaban  2.5 mg Oral BID  . docusate sodium  100 mg Oral BID  . famotidine  20 mg Oral Daily  . furosemide  40 mg Intravenous BID  . mouth rinse  15 mL Mouth Rinse BID  . metoprolol tartrate  25 mg Oral TID  . sertraline  75 mg Oral Daily   Continuous Infusions: . lactated ringers Stopped (04/26/17 0300)   PRN Meds: acetaminophen **OR** acetaminophen, alum & mag hydroxide-simeth, bisacodyl, diphenhydrAMINE, HYDROmorphone (DILAUDID) injection, menthol-cetylpyridinium **OR** phenol, metoCLOPramide **OR** metoCLOPramide (REGLAN) injection, ondansetron **OR** ondansetron (ZOFRAN) IV   Vital Signs    Vitals:   04/28/17 0000 04/28/17 0200 04/28/17 0300 04/28/17 0549  BP: 102/69 114/79 100/84   Pulse: (!) 110 (!) 30 (!) 25   Resp: _0 Temp:    (!) 97.4 F (36.3 C)  TempSrc:    Oral  SpO2: 98% 96% 96%   Weight:    190 lb 4.1 oz (86.3 kg)  Height:        Intake/Output Summary (Last 24 hours) at 04/28/2017 0802 Last data filed at 04/27/2017 2000 Gross per 24 hour  Intake 120 ml  Output -  Net 120 ml   Filed Weights   04/25/17 2235 04/28/17 0549  Weight: 186 lb 4.6 oz (84.5 kg) 190 lb 4.1 oz (86.3 kg)    Telemetry    Afib up to 130's with standing  - Personally Reviewed  ECG    AFib LBBB chronic  - Personally Reviewed  Physical Exam  Pale elderly female  GEN: No acute distress.   Neck: No JVD Cardiac: RRR, no murmurs, rubs, or gallops.  Respiratory: Clear to auscultation bilaterally. GI: Soft, nontender, non-distended  MS: No edema; No deformity. Neuro:  Nonfocal  Psych: Normal affect  Post left TKR   Labs    Chemistry Recent Labs  Lab 04/27/17 1914  NA 127*   K 4.5  CL 92*  CO2 21*  GLUCOSE 166*  BUN 29*  CREATININE 1.64*  CALCIUM 9.1  ALBUMIN 3.3*  GFRNONAA 28*  GFRAA 32*  ANIONGAP 14     Hematology Recent Labs  Lab 04/27/17 1632  HGB 11.6*  HCT 35.7*    Cardiac EnzymesNo results for input(s): TROPONINI in the last 168 hours. No results for input(s): TROPIPOC in the last 168 hours.   BNP Recent Labs  Lab 04/27/17 1914  BNP 3,961.8*     DDimer No results for input(s): DDIMER in the last 168 hours.   Radiology    Dg Chest Port 1 View  Result Date: 04/27/2017 CLINICAL DATA:  Shortness of breath.  Recent knee surgery. EXAM: PORTABLE CHEST 1 VIEW COMPARISON:  Chest x-ray dated April 19, 2017. FINDINGS: Stable cardiomegaly. Increased interstitial markings at the lung bases. New dense retrocardiac consolidation and small left pleural effusion. Probable small right pleural effusion. No pneumothorax. No acute osseous abnormality. IMPRESSION: 1. Stable cardiomegaly with pulmonary interstitial edema and small left greater than right pleural effusions. 2. New dense retrocardiac consolidation, which could reflect atelectasis due to adjacent effusion, versus pneumonia. Consider dedicated PA and lateral chest x-rays for further evaluation. Electronically Signed   By: Orville Govern.D.  On: 04/27/2017 19:47    Cardiac Studies   None   Patient Profile     84 y.o. 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 Afib rate contol limited by soft BP continue lopressor ted 2. DCM CHF contributing to dyspnea and low RA sats BNP up CXR with some fluid Change to lasix 40 iv bid will order 2 view CXR to r/o pneumonia she had spinal with MAC So aspiration less likely  3. BS:  Per IM service 4. Ortho:  Incision clean and dry pain control is good  Dr Marlou Porch covering [place] latter today as I am  off  For questions or updates, please contact [facility] Please consult [email or web address] for contact info under Cardiology/STEMI.      Signed, Jenkins Rouge, MD  04/28/2017, 8:02 AM

## 2017-04-28 NOTE — Progress Notes (Signed)
Physical Therapy Treatment Patient Details Name: [name] MRN: [id number] DOB: 07-02-1930 Today's Date: 04/28/2017    History of Present Illness Pt is an 84 year old female with chronic atrial fibrillation s/p L TKA on 04/25/17.  PMH includes failed TEE CV 06/19/15, NICM with an EF of 50% (echo 2017), chronic diastolic heart failure, LBBB, CHF.      PT Comments    Pt very fatigued and tired upon arrival. Pt and pt's family reported that pt has had a very "busy" day with tests. Pt agreeable to LE therex (see below). Pt's HR fluctuating between low 120's and high 130's throughout. Pt would continue to benefit from skilled physical therapy services at this time while admitted and after d/c to address the below listed limitations in order to improve overall safety and independence with functional mobility.    Follow Up Recommendations  Home health PT;Supervision/Assistance - 24 hour     Equipment Recommendations  None recommended by PT    Recommendations for Other Services       Precautions / Restrictions Precautions Precautions: Fall;Knee Restrictions Weight Bearing Restrictions: No LLE Weight Bearing: Weight bearing as tolerated    Mobility  Bed Mobility               General bed mobility comments: therex only this session  Transfers                    Ambulation/Gait                 Stairs            Wheelchair Mobility    Modified Rankin (Stroke Patients Only)       Balance                                            Cognition Arousal/Alertness: Awake/alert Behavior During Therapy: WFL for tasks assessed/performed Overall Cognitive Status: Within Functional Limits for tasks assessed                                 General Comments: sleepy from a very active day      Exercises Total Joint Exercises Ankle Circles/Pumps: AROM;Both;10 reps;Supine Quad Sets: AROM;Left;10 reps;Supine Gluteal  Sets: AROM;Both;10 reps;Supine Heel Slides: AAROM;Left;10 reps;Supine Hip ABduction/ADduction: AAROM;Left;10 reps;Supine Straight Leg Raises: AAROM;Left;10 reps;Supine Bridges: AROM;Strengthening;Both;10 reps;Supine    General Comments        Pertinent Vitals/Pain Pain Assessment: Faces Faces Pain Scale: Hurts a little bit Pain Location: L knee Pain Descriptors / Indicators: Sore Pain Intervention(s): Monitored during session;Repositioned    Home Living                      Prior Function            PT Goals (current goals can now be found in the care plan section) Acute Rehab PT Goals PT Goal Formulation: With patient Time For Goal Achievement: 05/10/17 Potential to Achieve Goals: Good Progress towards PT goals: Progressing toward goals    Frequency    7X/week      PT Plan Current plan remains appropriate    Co-evaluation              AM-PAC PT "6 Clicks" Daily Activity  Outcome Measure  Difficulty turning over  in bed (including adjusting bedclothes, sheets and blankets)?: A Little Difficulty moving from lying on back to sitting on the side of the bed? : A Little Difficulty sitting down on and standing up from a chair with arms (e.g., wheelchair, bedside commode, etc,.)?: Unable Help needed moving to and from a bed to chair (including a wheelchair)?: A Little Help needed walking in hospital room?: A Little Help needed climbing 3-5 steps with a railing? : A Lot 6 Click Score: 15    End of Session Equipment Utilized During Treatment: Oxygen Activity Tolerance: Patient limited by fatigue Patient left: in bed;with call bell/phone within reach;with family/visitor present Nurse Communication: Mobility status PT Visit Diagnosis: Unsteadiness on feet (R26.81);Muscle weakness (generalized) (M62.81)     Time: 4696-2952 PT Time Calculation (min) (ACUTE ONLY): 14 min  Charges:  $Therapeutic Exercise: 8-22 mins                    G Codes:        [name], [name], [name] [phone]    [name] [name] 04/28/2017, 4:36 PM

## 2017-04-28 NOTE — Progress Notes (Signed)
Subjective: 3 Days Post-Op Procedure(s) (LRB): TOTAL KNEE ARTHROPLASTY (Left)   Patient resting comfortably in bed. No pain in her knee. She feels very tired. Husband at bedside. She mentions that over the past 6 months she has been very tired and would get short of breath. Her husband states that this has been building for some time.   Activity level:  wbat Diet tolerance:  ok Voiding:  ok Patient reports pain as mild.    Objective: Vital signs in last 24 hours: Temp:  [97.3 F (36.3 C)-97.8 F (36.6 C)] 97.4 F (36.3 C) (01/18 0549) Pulse Rate:  [25-128] 25 (01/18 0300) Resp:  [15-23] 16 (01/18 0300) BP: (100-114)/(69-84) 100/84 (01/18 0300) SpO2:  [96 %-99 %] 96 % (01/18 0300) Weight:  [86.3 kg (190 lb 4.1 oz)] 86.3 kg (190 lb 4.1 oz) (01/18 0549)  Labs: Recent Labs    04/27/17 1632  HGB 11.6*   Recent Labs    04/27/17 1632  HCT 35.7*   Recent Labs    04/27/17 1914  NA 127*  K 4.5  CL 92*  CO2 21*  BUN 29*  CREATININE 1.64*  GLUCOSE 166*  CALCIUM 9.1   Recent Labs    04/25/17 1030  INR 1.13    Physical Exam:  Neurologically intact ABD soft Neurovascular intact Sensation intact distally Intact pulses distally Dorsiflexion/Plantar flexion intact Incision: dressing C/D/I and no drainage No cellulitis present Compartment soft  Calf soft and non-tender with negative homans.  Assessment/Plan:  3 Days Post-Op Procedure(s) (LRB): TOTAL KNEE ARTHROPLASTY (Left) Advance diet Up with therapy  We greatly appreciate the medical team and cardiology input. Continue on home Eliquis. Continue tylenol only for pain. Her knee is the least of her problems at this point. We will see how she progresses but my need SNF for medical management. Encouraged incentive spirometer and getting up with PT. Follow up in office 2 weeks post op.   [name], Ginger Organ 04/28/2017, 8:05 AM

## 2017-04-28 NOTE — Progress Notes (Signed)
RT unavailable to perform CPT at this time. Will begin at next scheduled time.

## 2017-04-28 NOTE — Progress Notes (Signed)
PROGRESS NOTE    [name]  GQB:[id number] DOB: 10/10/1930 DOA: 04/25/2017 PCP: [name], MD  Outpatient Specialists:   Brief Narrative: Patient is an 84 y.o. Caucasian female with past medical history significant for CHF, possibly combined systolic and diastolic; atrial fibrillation with RVR currently being managed by Cardiology team, severe left knee DJD for which the patient underwent left total knee arthroplasty on 04/25/2017. Post op, patient has been in atrial fibrillation with rapid ventricular response and the Cardiology team is managing. Patient has been requiring supplemental oxygen, especially with ambulation to maintain Oxygen saturation greater than 91%. No pleuritic chest pain. Patient denies orthopnea, and no significant leg edema, though, patient has been on Torsemide. No history of Tobacco use or exposure to second hand smoking. CXR done on 04/19/17 was interpreted as hyperinflated lungs with chronic bronchitic and interstitial changes ("question fibrosis"), enlarged cardiac silhouette with slight pulmonary vascular congestion. Repeat CXR revealed left pleural effusion and atelectasis. BNP is elevated. No recent ECHO. Oral Torsemide is changed to PO Lazix. VQ Scan is low probability to PE.  Assessment & Plan:   - Acute Respiratory failure, type uncertain for now/SOB, likely related to CHF, dense atelectasis and right pleural effusion.   - CHF.   - Atrial fibrillation with RVR   - Severe Atelectasis    - Primary osteoarthritis of left knee S/P total knee arthroplasty     - Possible previously undiagosed chronic lung disease/fibrosis   Afib with RVR as per Cardiology Continue IV Lasix as per Cardiology. Chest PT Incentive spirometry Repeat CXR reviewed VQ scan result noted.   DVT prophylaxis: Apixaban Code Status: Full Family Communication:  Husband and Daughter Disposition Plan: Will depend on Ortho team.   Subjective: SOB is  improving  Objective: Vitals:   04/28/17 1138 04/28/17 1200 04/28/17 1559 04/28/17 1631  BP: 110/84 100/82 105/73 105/73  Pulse: 98 (!) 127 96 (!) 125  Resp: 19 (!) 21  18  Temp: (!) 96.5 F (35.8 C)   (!) 97.1 F (36.2 C)  TempSrc: Oral   Oral  SpO2: 99% 100%  98%  Weight:      Height:        Intake/Output Summary (Last 24 hours) at 04/28/2017 1652 Last data filed at 04/28/2017 0934 Gross per 24 hour  Intake 480 ml  Output -  Net 480 ml   Filed Weights   04/25/17 2235 04/28/17 0549  Weight: 84.5 kg (186 lb 4.6 oz) 86.3 kg (190 lb 4.1 oz)    Examination:  General exam: Appears calm and comfortable  Respiratory system: Decreased air entry. Cardiovascular system: S1 & S2. Minimal pedal edema. Gastrointestinal system: Abdomen is obese, soft and nontender.  Central nervous system: Alert and oriented. No focal neurological deficits. Extremities: Minimal pedal edema.  Data Reviewed: I have personally reviewed following labs and imaging studies  CBC: Recent Labs  Lab 04/27/17 1632  HGB 11.6*  HCT 35.7*   Basic Metabolic Panel: Recent Labs  Lab 04/27/17 1914  NA 127*  K 4.5  CL 92*  CO2 21*  GLUCOSE 166*  BUN 29*  CREATININE 1.64*  CALCIUM 9.1  MG 2.1  PHOS 3.4   GFR: Estimated Creatinine Clearance: 27.7 mL/min (A) (by C-G formula based on SCr of 1.64 mg/dL (H)). Liver Function Tests: Recent Labs  Lab 04/27/17 1914  ALBUMIN 3.3*   No results for input(s): LIPASE, AMYLASE in the last 168 hours. No results for input(s): AMMONIA in the last 168 hours.  Coagulation Profile: Recent Labs  Lab 04/25/17 1030  INR 1.13   Cardiac Enzymes: No results for input(s): CKTOTAL, CKMB, CKMBINDEX, TROPONINI in the last 168 hours. BNP (last 3 results) No results for input(s): PROBNP in the last 8760 hours. HbA1C: Recent Labs    04/27/17 1914  HGBA1C 5.3   CBG: Recent Labs  Lab 04/27/17 0751 04/27/17 1207 04/27/17 1540 04/27/17 2023 04/28/17 0838   GLUCAP 197* 150* 143* 147* 115*   Lipid Profile: No results for input(s): CHOL, HDL, LDLCALC, TRIG, CHOLHDL, LDLDIRECT in the last 72 hours. Thyroid Function Tests: No results for input(s): TSH, T4TOTAL, FREET4, T3FREE, THYROIDAB in the last 72 hours. Anemia Panel: No results for input(s): VITAMINB12, FOLATE, FERRITIN, TIBC, IRON, RETICCTPCT in the last 72 hours. Urine analysis:    Component Value Date/Time   COLORURINE YELLOW 04/19/2017 1027   APPEARANCEUR CLEAR 04/19/2017 1027   LABSPEC 1.006 04/19/2017 1027   PHURINE 6.0 04/19/2017 1027   GLUCOSEU NEGATIVE 04/19/2017 1027   HGBUR NEGATIVE 04/19/2017 1027   HGBUR moderate 06/01/2010 1102   BILIRUBINUR NEGATIVE 04/19/2017 1027   BILIRUBINUR Neg 07/10/2015 1216   KETONESUR NEGATIVE 04/19/2017 1027   PROTEINUR NEGATIVE 04/19/2017 1027   UROBILINOGEN 4.0 07/10/2015 1216   UROBILINOGEN 0.2 06/01/2010 1102   NITRITE NEGATIVE 04/19/2017 1027   LEUKOCYTESUR NEGATIVE 04/19/2017 1027   Sepsis Labs: @LABRCNTIP (procalcitonin:4,lacticidven:4)  ) Recent Results (from the past 240 hour(s))  Surgical pcr screen     Status: None   Collection Time: 04/19/17 10:27 AM  Result Value Ref Range Status   MRSA, PCR NEGATIVE NEGATIVE Final   Staphylococcus aureus NEGATIVE NEGATIVE Final    Comment: (NOTE) The Xpert SA Assay (FDA approved for NASAL specimens in patients 62 years of age and older), is one component of a comprehensive surveillance program. It is not intended to diagnose infection nor to guide or monitor treatment.          Radiology Studies: Dg Chest 2 View  Result Date: 04/28/2017 CLINICAL DATA:  Congestive heart failure EXAM: CHEST  2 VIEW COMPARISON:  April 27, 2017 FINDINGS: There is cardiomegaly with mild pulmonary venous hypertension. There is trace interstitial pulmonary edema. There is a small left pleural effusion with left base atelectasis. There is no consolidation. There is aortic atherosclerosis. No  adenopathy. No bone lesions. IMPRESSION: Pulmonary vascular congestion with small left pleural effusion and trace interstitial edema. Left base atelectasis. No consolidation. Aortic atherosclerosis. Aortic Atherosclerosis (ICD10-I70.0). Electronically Signed   By: Bretta Bang III M.D.   On: 04/28/2017 10:11   Nm Pulmonary Perf And Vent  Result Date: 04/28/2017 CLINICAL DATA:  Shortness of breath. History of CHF and atrial fibrillation. EXAM: NUCLEAR MEDICINE VENTILATION - PERFUSION LUNG SCAN TECHNIQUE: Ventilation images were obtained in multiple projections using inhaled aerosol Tc-51m DTPA. Perfusion images were obtained in multiple projections after intravenous injection of Tc-8m MAA. RADIOPHARMACEUTICALS:  31.4 mCi Technetium-69m DTPA aerosol inhalation and 4.0 mCi Technetium-56m MAA IV COMPARISON:  Chest x-ray 04/28/2017. FINDINGS: Multiple bilateral prominent ventilatory defects. No significant perfusion defects. Low probability scan for pulmonary embolus. IMPRESSION: Low probability pulmonary embolus. Multiple bilateral ventilatory defects may be related to CHF noted on today's chest x-ray. Electronically Signed   By: Maisie Fus  Register   On: 04/28/2017 14:46   Dg Chest Port 1 View  Result Date: 04/27/2017 CLINICAL DATA:  Shortness of breath.  Recent knee surgery. EXAM: PORTABLE CHEST 1 VIEW COMPARISON:  Chest x-ray dated April 19, 2017. FINDINGS: Stable cardiomegaly. Increased interstitial markings  at the lung bases. New dense retrocardiac consolidation and small left pleural effusion. Probable small right pleural effusion. No pneumothorax. No acute osseous abnormality. IMPRESSION: 1. Stable cardiomegaly with pulmonary interstitial edema and small left greater than right pleural effusions. 2. New dense retrocardiac consolidation, which could reflect atelectasis due to adjacent effusion, versus pneumonia. Consider dedicated PA and lateral chest x-rays for further evaluation. Electronically  Signed   By: Obie Dredge M.D.   On: 04/27/2017 19:47        Scheduled Meds: . apixaban  2.5 mg Oral BID  . docusate sodium  100 mg Oral BID  . famotidine  20 mg Oral Daily  . furosemide  40 mg Intravenous BID  . mouth rinse  15 mL Mouth Rinse BID  . metoprolol tartrate  25 mg Oral TID  . sertraline  75 mg Oral Daily   Continuous Infusions: . lactated ringers Stopped (04/26/17 0300)     LOS: 3 days    Time spent: 40 Mins    Berton Mount, MD  [facility] Pager #: [phone] 7PM-7AM contact night coverage as above

## 2017-04-29 ENCOUNTER — Encounter (HOSPITAL_COMMUNITY): Payer: Self-pay | Admitting: *Deleted

## 2017-04-29 ENCOUNTER — Other Ambulatory Visit: Payer: Self-pay

## 2017-04-29 LAB — GLUCOSE, CAPILLARY: Glucose-Capillary: 115 mg/dL — ABNORMAL HIGH (ref 65–99)

## 2017-04-29 MED ORDER — FUROSEMIDE 10 MG/ML IJ SOLN
60.0000 mg | Freq: Two times a day (BID) | INTRAMUSCULAR | Status: DC
  Administered 2017-04-30 – 2017-05-02 (×6): 60 mg via INTRAVENOUS
  Filled 2017-04-29 (×8): qty 6

## 2017-04-29 NOTE — Progress Notes (Signed)
    Patient doing well PO day 4 S/P L TKA per Dr Jerl Santos. Pt has done well form standpoint of her knee, PO course complicated by cardiac pathology and ARF. Reports passing gas and healthy appetite. Pt states she would prefer D/C home over SNF.   Physical Exam: Vitals:   04/29/17 0422 04/29/17 0700  BP: 102/71   Pulse:    Resp:    Temp: 97.7 F (36.5 C) (!) 97.5 F (36.4 C)  SpO2:     CBC Latest Ref Rng & Units 04/27/2017 04/19/2017 09/27/2016  WBC 4.0 - 10.5 K/uL - 8.2 6.8  Hemoglobin 12.0 - 15.0 g/dL 11.6(L) 12.4 12.4  Hematocrit 36.0 - 46.0 % 35.7(L) 39.2 37.5  Platelets 150 - 400 K/uL - 276 293.0   BMP Latest Ref Rng & Units 04/27/2017 04/19/2017 01/30/2017  Glucose 65 - 99 mg/dL 978(E) 96 97  BUN 6 - 20 mg/dL 78(S) 12(K) 23  Creatinine 0.44 - 1.00 mg/dL 2.08(H) 3.88(T) 1.95  Sodium 135 - 145 mmol/L 127(L) 136 139  Potassium 3.5 - 5.1 mmol/L 4.5 3.2(L) 3.8  Chloride 101 - 111 mmol/L 92(L) 97(L) 98  CO2 22 - 32 mmol/L 21(L) 29 32  Calcium 8.9 - 10.3 mg/dL 9.1 9.2 9.5   Dressing in place, CDI, pt resting comfortably in bed, distal compartments soft and SCD's in place. NVI  POD #4 s/p L TKA per Dr Jerl Santos  - up with PT/OT, encourage ambulation  -Progress towards D/C home with [facility] vs SNF pending progress and recs  -WBAT - Continue current px regimen (tylenol), px minimal and well controlled - Eliquis for anticoag, home med - F/U in ortho office 2 weeks PO with Dr Jerl Santos  - From orthopaedic standpoint pt ready for D/C home with [facility] vs SNF pending PT recs. Will await release and recs from IM team and Cardiology, we appreciate their input and ongoing care  Pt Followed by IM and Cardiology for: - ARF, likely related to CHF, dense atelectasis and right pleural effusion. Followed by IM and Cardiology  - Atrial Fib with RVR in pt with CHF, followed by Cardiology  - Severe Atelectasis, followed by IM - Possible previously undiagosed chronic lung disease/fibrosis

## 2017-04-29 NOTE — Progress Notes (Signed)
Per  Eden Emms continue with Lopressor tid due to limited treatment options with soft BP's.  Will continue to monitor pt. Pt is on O2 nasal cannula 2L and resting comfortable in bed at this time.

## 2017-04-29 NOTE — Plan of Care (Signed)
Pt able to ambulate in room to bathroom using front wheel walker and 1 person assistance without complications or issues.

## 2017-04-29 NOTE — Progress Notes (Signed)
Physical Therapy Treatment Patient Details Name: [name] MRN: [id number] DOB: December 31, 1930 Today's Date: 04/29/2017    History of Present Illness Pt is an 84 year old female with chronic atrial fibrillation s/p L TKA on 04/25/17.  PMH includes failed TEE CV 06/19/15, NICM with an EF of 50% (echo 2017), chronic diastolic heart failure, LBBB, CHF.      PT Comments    Pt limited this session to bed level therex only secondary to RN reports of elevated HR this AM (up to 150-160's). During session pt's HR fluctuating from low 120's to low 150's. Pt would continue to benefit from skilled physical therapy services at this time while admitted and after d/c to address the below listed limitations in order to improve overall safety and independence with functional mobility.    Follow Up Recommendations  Home health PT;Supervision/Assistance - 24 hour     Equipment Recommendations  None recommended by PT    Recommendations for Other Services       Precautions / Restrictions Precautions Precautions: Fall;Knee Restrictions Weight Bearing Restrictions: No LLE Weight Bearing: Weight bearing as tolerated    Mobility  Bed Mobility               General bed mobility comments: therex only this session per RN's request as pt's HR has been elevated (150-160's) this AM  Transfers                    Ambulation/Gait                 Stairs            Wheelchair Mobility    Modified Rankin (Stroke Patients Only)       Balance                                            Cognition Arousal/Alertness: Lethargic Behavior During Therapy: WFL for tasks assessed/performed Overall Cognitive Status: Within Functional Limits for tasks assessed                                 General Comments: pt remains very "tired"      Exercises Total Joint Exercises Ankle Circles/Pumps: AROM;Left;20 reps;Supine Quad Sets: AROM;Left;10  reps;Supine Gluteal Sets: AROM;10 reps;Supine Towel Squeeze: AROM;Both;10 reps;Supine Short Arc Quad: AROM;Left;10 reps;Supine Heel Slides: AROM;Left;10 reps;Supine Hip ABduction/ADduction: AAROM;Left;10 reps;Supine Straight Leg Raises: AAROM;Left;10 reps;Supine    General Comments        Pertinent Vitals/Pain Pain Assessment: No/denies pain    Home Living                      Prior Function            PT Goals (current goals can now be found in the care plan section) Acute Rehab PT Goals PT Goal Formulation: With patient Time For Goal Achievement: 05/10/17 Potential to Achieve Goals: Good Progress towards PT goals: Progressing toward goals    Frequency    7X/week      PT Plan Current plan remains appropriate    Co-evaluation              AM-PAC PT "6 Clicks" Daily Activity  Outcome Measure  Difficulty turning over in bed (including adjusting bedclothes, sheets and blankets)?: A Little Difficulty  moving from lying on back to sitting on the side of the bed? : A Little Difficulty sitting down on and standing up from a chair with arms (e.g., wheelchair, bedside commode, etc,.)?: Unable Help needed moving to and from a bed to chair (including a wheelchair)?: A Little Help needed walking in hospital room?: A Little Help needed climbing 3-5 steps with a railing? : A Lot 6 Click Score: 15    End of Session   Activity Tolerance: Patient limited by fatigue;Other (comment)(pt limited secondary to elevated HR) Patient left: in bed;with call bell/phone within reach;with bed alarm set;with family/visitor present;with SCD's reapplied Nurse Communication: Mobility status PT Visit Diagnosis: Unsteadiness on feet (R26.81);Muscle weakness (generalized) (M62.81)     Time: 1610-9604 PT Time Calculation (min) (ACUTE ONLY): 14 min  Charges:  $Therapeutic Exercise: 8-22 mins                    G Codes:       [place], [place], [name] [phone]    [name] Lutricia Widjaja 04/29/2017, 12:17 PM

## 2017-04-29 NOTE — Plan of Care (Signed)
Discussed nutrition with pt she agreed to try small meals several times a day. Will continue to monitor what she eats today.

## 2017-04-29 NOTE — Progress Notes (Signed)
Physical Therapy Treatment Patient Details Name: [name] MRN: [id number] DOB: 02/26/1931 Today's Date: 04/29/2017    History of Present Illness Pt is an 84 year old female with chronic atrial fibrillation s/p L TKA on 04/25/17.  PMH includes failed TEE CV 06/19/15, NICM with an EF of 50% (echo 2017), chronic diastolic heart failure, LBBB, CHF.      PT Comments    Pt has definitely gotten much weaker functionally since session on 1/17 when she was able to ambulate. Unfortunately, pt's HR has been a limiting factor. This session she was able to tolerate bed mobility with min A, transfers with min A and RW and ambulated a very short distance at EOB with min A and use of RW. Pt's HR fluctuating between low 120's and mid 140's throughout. Pt would benefit from ST rehab at a SNF as she has gotten much weaker; however, pt very determined to return home. Pt would continue to benefit from skilled physical therapy services at this time while admitted and after d/c to address the below listed limitations in order to improve overall safety and independence with functional mobility.   Follow Up Recommendations  Home health PT;Supervision/Assistance - 24 hour;Other (comment)(adamant about returning home but would benefit from ST SNF)     Equipment Recommendations  None recommended by PT    Recommendations for Other Services       Precautions / Restrictions Precautions Precautions: Fall;Knee Precaution Comments: precautions/positioning reviewed Restrictions Weight Bearing Restrictions: Yes LLE Weight Bearing: Weight bearing as tolerated    Mobility  Bed Mobility Overal bed mobility: Needs Assistance Bed Mobility: Supine to Sit;Sit to Supine     Supine to sit: Min assist Sit to supine: Min assist   General bed mobility comments: increased time and effort, assist to elevate trunk and assist to return L LE back onto bed  Transfers Overall transfer level: Needs assistance Equipment  used: Rolling walker (2 wheeled) Transfers: Sit to/from [employer] Sit to Stand: Min assist Stand pivot transfers: Min assist       General transfer comment: assist to power up into standing and for stability/balance, cues for safety  Ambulation/Gait             General Gait Details: pt able to take 6-7 steps from [facility] at foot of bed to [facility] with min A and RW. Pt very limited secondary to fatigue   Stairs            Wheelchair Mobility    Modified Rankin (Stroke Patients Only)       Balance Overall balance assessment: Needs assistance Sitting-balance support: No upper extremity supported;Feet supported Sitting balance-Leahy Scale: Fair     Standing balance support: Bilateral upper extremity supported;During functional activity Standing balance-Leahy Scale: Poor                              Cognition Arousal/Alertness: Lethargic Behavior During Therapy: WFL for tasks assessed/performed Overall Cognitive Status: Within Functional Limits for tasks assessed                                        Exercises      General Comments        Pertinent Vitals/Pain Pain Assessment: No/denies pain    Home Living  Prior Function            PT Goals (current goals can now be found in the care plan section) Acute Rehab PT Goals PT Goal Formulation: With patient Time For Goal Achievement: 05/10/17 Potential to Achieve Goals: Good Progress towards PT goals: Progressing toward goals    Frequency    7X/week      PT Plan Current plan remains appropriate    Co-evaluation              AM-PAC PT "6 Clicks" Daily Activity  Outcome Measure  Difficulty turning over in bed (including adjusting bedclothes, sheets and blankets)?: A Little Difficulty moving from lying on back to sitting on the side of the bed? : Unable Difficulty sitting down on and standing up from a chair with arms  (e.g., wheelchair, bedside commode, etc,.)?: Unable Help needed moving to and from a bed to chair (including a wheelchair)?: A Little Help needed walking in hospital room?: A Little Help needed climbing 3-5 steps with a railing? : A Lot 6 Click Score: 13    End of Session Equipment Utilized During Treatment: Gait belt;Oxygen Activity Tolerance: Patient limited by fatigue Patient left: in bed;with call bell/phone within reach;with bed alarm set;with family/visitor present;with SCD's reapplied Nurse Communication: Mobility status PT Visit Diagnosis: Unsteadiness on feet (R26.81);Muscle weakness (generalized) (M62.81)     Time: 3810-1751 PT Time Calculation (min) (ACUTE ONLY): 17 min  Charges:  $Therapeutic Activity: 8-22 mins                    G Codes:       [place], [place], [name] [phone]    [name] 04/29/2017, 4:01 PM

## 2017-04-29 NOTE — Progress Notes (Signed)
PROGRESS NOTE    Loresa Haapala Simien  KGM:[id number] DOB: October 14, 1930 DOA: 04/25/2017 PCP: [name], MD  Outpatient Specialists:   Brief Narrative: Patient is an 84 y.o. Caucasian female with past medical history significant for CHF, possibly combined systolic and diastolic; atrial fibrillation with RVR currently being managed by Cardiology team, severe left knee DJD for which the patient underwent left total knee arthroplasty on 04/25/2017. Post op, patient has been in atrial fibrillation with rapid ventricular response and the Cardiology team is managing. Patient has been requiring supplemental oxygen, especially with ambulation to maintain Oxygen saturation greater than 91%. No pleuritic chest pain. Patient denies orthopnea, and no significant leg edema, though, patient has been on Torsemide. No history of Tobacco use or exposure to second hand smoking. CXR done on 04/19/17 was interpreted as hyperinflated lungs with chronic bronchitic and interstitial changes ("question fibrosis"), enlarged cardiac silhouette with slight pulmonary vascular congestion. Repeat CXR revealed left pleural effusion and atelectasis. BNP is elevated. No recent ECHO. Oral Torsemide is changed to PO Lasix. VQ Scan is low probability to PE.  04/29/17 - Patient seen alongside patient's daughter. Patient oxygen requirement is decreasing. Patient remains in afib RVR - Cardiology is managing.  Assessment & Plan:   - Acute Respiratory failure, likely multifactorial, likely related to CHF, dense atelectasis and right pleural effusion.   - CHF.   - Atrial fibrillation with RVR   - Severe Atelectasis    - Primary osteoarthritis of left knee S/P total knee arthroplasty     - Possible previously undiagosed chronic lung disease/fibrosis   Afib with RVR as per Cardiology Continue IV Lasix as per Cardiology. Chest PT Incentive spirometry Repeat CXR reviewed VQ scan result noted.   DVT prophylaxis: Apixaban Code Status:  Full Family Communication:  Husband and Daughter Disposition Plan: Will depend on Ortho team.   Subjective: SOB is improving  Objective: Vitals:   04/29/17 1046 04/29/17 1100 04/29/17 1200 04/29/17 1212  BP: 106/80 106/74 97/80 97/80   Pulse: (!) 131 (!) 148 (!) 116   Resp:  14 17   Temp:    97.7 F (36.5 C)  TempSrc:    Oral  SpO2:  93% 98%   Weight:      Height:        Intake/Output Summary (Last 24 hours) at 04/29/2017 1354 Last data filed at 04/28/2017 1824 Gross per 24 hour  Intake 120 ml  Output -  Net 120 ml   Filed Weights   04/25/17 2235 04/28/17 0549  Weight: 84.5 kg (186 lb 4.6 oz) 86.3 kg (190 lb 4.1 oz)    Examination:  General exam: Appears calm and comfortable  Respiratory system: Decreased air entry. Cardiovascular system: S1 & S2. Minimal pedal edema. Gastrointestinal system: Abdomen is obese, soft and nontender.  Central nervous system: Alert and oriented. No focal neurological deficits. Extremities: Minimal pedal edema.  Data Reviewed: I have personally reviewed following labs and imaging studies  CBC: Recent Labs  Lab 04/27/17 1632  HGB 11.6*  HCT 35.7*   Basic Metabolic Panel: Recent Labs  Lab 04/27/17 1914  NA 127*  K 4.5  CL 92*  CO2 21*  GLUCOSE 166*  BUN 29*  CREATININE 1.64*  CALCIUM 9.1  MG 2.1  PHOS 3.4   GFR: Estimated Creatinine Clearance: 27.7 mL/min (A) (by C-G formula based on SCr of 1.64 mg/dL (H)). Liver Function Tests: Recent Labs  Lab 04/27/17 1914  ALBUMIN 3.3*   No results for input(s): LIPASE, AMYLASE  in the last 168 hours. No results for input(s): AMMONIA in the last 168 hours. Coagulation Profile: Recent Labs  Lab 04/25/17 1030  INR 1.13   Cardiac Enzymes: No results for input(s): CKTOTAL, CKMB, CKMBINDEX, TROPONINI in the last 168 hours. BNP (last 3 results) No results for input(s): PROBNP in the last 8760 hours. HbA1C: Recent Labs    04/27/17 1914  HGBA1C 5.3   CBG: Recent Labs  Lab  04/27/17 1207 04/27/17 1540 04/27/17 2023 04/28/17 0838 04/29/17 0755  GLUCAP 150* 143* 147* 115* 115*   Lipid Profile: No results for input(s): CHOL, HDL, LDLCALC, TRIG, CHOLHDL, LDLDIRECT in the last 72 hours. Thyroid Function Tests: No results for input(s): TSH, T4TOTAL, FREET4, T3FREE, THYROIDAB in the last 72 hours. Anemia Panel: No results for input(s): VITAMINB12, FOLATE, FERRITIN, TIBC, IRON, RETICCTPCT in the last 72 hours. Urine analysis:    Component Value Date/Time   COLORURINE YELLOW 04/19/2017 1027   APPEARANCEUR CLEAR 04/19/2017 1027   LABSPEC 1.006 04/19/2017 1027   PHURINE 6.0 04/19/2017 1027   GLUCOSEU NEGATIVE 04/19/2017 1027   HGBUR NEGATIVE 04/19/2017 1027   HGBUR moderate 06/01/2010 1102   BILIRUBINUR NEGATIVE 04/19/2017 1027   BILIRUBINUR Neg 07/10/2015 1216   KETONESUR NEGATIVE 04/19/2017 1027   PROTEINUR NEGATIVE 04/19/2017 1027   UROBILINOGEN 4.0 07/10/2015 1216   UROBILINOGEN 0.2 06/01/2010 1102   NITRITE NEGATIVE 04/19/2017 1027   LEUKOCYTESUR NEGATIVE 04/19/2017 1027   Sepsis Labs: @LABRCNTIP (procalcitonin:4,lacticidven:4)  ) No results found for this or any previous visit (from the past 240 hour(s)).       Radiology Studies: Dg Chest 2 View  Result Date: 04/28/2017 CLINICAL DATA:  Congestive heart failure EXAM: CHEST  2 VIEW COMPARISON:  April 27, 2017 FINDINGS: There is cardiomegaly with mild pulmonary venous hypertension. There is trace interstitial pulmonary edema. There is a small left pleural effusion with left base atelectasis. There is no consolidation. There is aortic atherosclerosis. No adenopathy. No bone lesions. IMPRESSION: Pulmonary vascular congestion with small left pleural effusion and trace interstitial edema. Left base atelectasis. No consolidation. Aortic atherosclerosis. Aortic Atherosclerosis (ICD10-I70.0). Electronically Signed   By: Bretta Bang III M.D.   On: 04/28/2017 10:11   Nm Pulmonary Perf And  Vent  Result Date: 04/28/2017 CLINICAL DATA:  Shortness of breath. History of CHF and atrial fibrillation. EXAM: NUCLEAR MEDICINE VENTILATION - PERFUSION LUNG SCAN TECHNIQUE: Ventilation images were obtained in multiple projections using inhaled aerosol Tc-40m DTPA. Perfusion images were obtained in multiple projections after intravenous injection of Tc-64m MAA. RADIOPHARMACEUTICALS:  31.4 mCi Technetium-20m DTPA aerosol inhalation and 4.0 mCi Technetium-57m MAA IV COMPARISON:  Chest x-ray 04/28/2017. FINDINGS: Multiple bilateral prominent ventilatory defects. No significant perfusion defects. Low probability scan for pulmonary embolus. IMPRESSION: Low probability pulmonary embolus. Multiple bilateral ventilatory defects may be related to CHF noted on today's chest x-ray. Electronically Signed   By: Maisie Fus  Register   On: 04/28/2017 14:46   Dg Chest Port 1 View  Result Date: 04/27/2017 CLINICAL DATA:  Shortness of breath.  Recent knee surgery. EXAM: PORTABLE CHEST 1 VIEW COMPARISON:  Chest x-ray dated April 19, 2017. FINDINGS: Stable cardiomegaly. Increased interstitial markings at the lung bases. New dense retrocardiac consolidation and small left pleural effusion. Probable small right pleural effusion. No pneumothorax. No acute osseous abnormality. IMPRESSION: 1. Stable cardiomegaly with pulmonary interstitial edema and small left greater than right pleural effusions. 2. New dense retrocardiac consolidation, which could reflect atelectasis due to adjacent effusion, versus pneumonia. Consider dedicated PA and lateral chest  x-rays for further evaluation. Electronically Signed   By: Obie Dredge M.D.   On: 04/27/2017 19:47        Scheduled Meds: . apixaban  2.5 mg Oral BID  . docusate sodium  100 mg Oral BID  . famotidine  20 mg Oral Daily  . furosemide  60 mg Intravenous BID  . mouth rinse  15 mL Mouth Rinse BID  . metoprolol tartrate  25 mg Oral TID  . sertraline  75 mg Oral Daily    Continuous Infusions: . lactated ringers Stopped (04/26/17 0300)     LOS: 4 days    Time spent: 40 Mins    Berton Mount, MD  [facility] Pager #: [phone] 7PM-7AM contact night coverage as above

## 2017-04-29 NOTE — Progress Notes (Signed)
Pt heartrate 114-167 text paged Obglata was instructed to contact cardiology on call.  Text paged Allred on call cardiologist and was instructed to call Nishan.  Number given to me by Allred had no option to leave message or call back number. No answer. Called the office of Nishan and left message for the Dr on call to call me at [phone]. Answering service said on call physician will call within 30 minutes.

## 2017-04-29 NOTE — Progress Notes (Signed)
Progress Note  Patient Name: [name] Date of Encounter: 04/29/2017  Primary Cardiologist: No primary care provider on file.   Subjective   Feels well this am wants to go home and not rehab BP remains soft  V/Q scan negative Yesterday   Inpatient Medications    Scheduled Meds: . apixaban  2.5 mg Oral BID  . docusate sodium  100 mg Oral BID  . famotidine  20 mg Oral Daily  . furosemide  40 mg Intravenous BID  . mouth rinse  15 mL Mouth Rinse BID  . metoprolol tartrate  25 mg Oral TID  . sertraline  75 mg Oral Daily   Continuous Infusions: . lactated ringers Stopped (04/26/17 0300)   PRN Meds: acetaminophen **OR** acetaminophen, alum & mag hydroxide-simeth, bisacodyl, diphenhydrAMINE, HYDROmorphone (DILAUDID) injection, menthol-cetylpyridinium **OR** phenol, metoCLOPramide **OR** metoCLOPramide (REGLAN) injection, ondansetron **OR** ondansetron (ZOFRAN) IV   Vital Signs    Vitals:   04/28/17 1700 04/28/17 1938 04/28/17 2300 04/29/17 0422  BP: 93/82  98/77 102/71  Pulse: (!) 127     Resp: 19     Temp:  (!) 97.5 F (36.4 C) (!) 97.5 F (36.4 C) 97.7 F (36.5 C)  TempSrc:  Oral Oral Oral  SpO2: 98%     Weight:      Height:        Intake/Output Summary (Last 24 hours) at 04/29/2017 0816 Last data filed at 04/28/2017 1824 Gross per 24 hour  Intake 480 ml  Output -  Net 480 ml   Filed Weights   04/25/17 2235 04/28/17 0549  Weight: 186 lb 4.6 oz (84.5 kg) 190 lb 4.1 oz (86.3 kg)    Telemetry    Afib up to 130's with standing  - Personally Reviewed  ECG    AFib LBBB chronic  - Personally Reviewed  Physical Exam  Pale elderly female  GEN: No acute distress.   Neck: No JVD Cardiac: RRR, no murmurs, rubs, or gallops.  Respiratory: Clear to auscultation bilaterally. GI: Soft, nontender, non-distended  MS: No edema; No deformity. Neuro:  Nonfocal  Psych: Normal affect  Post left TKR   Labs    Chemistry Recent Labs  Lab 04/27/17 1914  NA  127*  K 4.5  CL 92*  CO2 21*  GLUCOSE 166*  BUN 29*  CREATININE 1.64*  CALCIUM 9.1  ALBUMIN 3.3*  GFRNONAA 28*  GFRAA 32*  ANIONGAP 14     Hematology Recent Labs  Lab 04/27/17 1632  HGB 11.6*  HCT 35.7*    Cardiac EnzymesNo results for input(s): TROPONINI in the last 168 hours. No results for input(s): TROPIPOC in the last 168 hours.   BNP Recent Labs  Lab 04/27/17 1914  BNP 3,961.8*     DDimer No results for input(s): DDIMER in the last 168 hours.   Radiology    Dg Chest 2 View  Result Date: 04/28/2017 CLINICAL DATA:  Congestive heart failure EXAM: CHEST  2 VIEW COMPARISON:  April 27, 2017 FINDINGS: There is cardiomegaly with mild pulmonary venous hypertension. There is trace interstitial pulmonary edema. There is a small left pleural effusion with left base atelectasis. There is no consolidation. There is aortic atherosclerosis. No adenopathy. No bone lesions. IMPRESSION: Pulmonary vascular congestion with small left pleural effusion and trace interstitial edema. Left base atelectasis. No consolidation. Aortic atherosclerosis. Aortic Atherosclerosis (ICD10-I70.0). Electronically Signed   By: Bretta Bang III M.D.   On: 04/28/2017 10:11   Nm Pulmonary Perf And Vent  Result Date: 04/28/2017 CLINICAL DATA:  Shortness of breath. History of CHF and atrial fibrillation. EXAM: NUCLEAR MEDICINE VENTILATION - PERFUSION LUNG SCAN TECHNIQUE: Ventilation images were obtained in multiple projections using inhaled aerosol Tc-66m DTPA. Perfusion images were obtained in multiple projections after intravenous injection of Tc-107m MAA. RADIOPHARMACEUTICALS:  31.4 mCi Technetium-79m DTPA aerosol inhalation and 4.0 mCi Technetium-15m MAA IV COMPARISON:  Chest x-ray 04/28/2017. FINDINGS: Multiple bilateral prominent ventilatory defects. No significant perfusion defects. Low probability scan for pulmonary embolus. IMPRESSION: Low probability pulmonary embolus. Multiple bilateral  ventilatory defects may be related to CHF noted on today's chest x-ray. Electronically Signed   By: Maisie Fus  Register   On: 04/28/2017 14:46   Dg Chest Port 1 View  Result Date: 04/27/2017 CLINICAL DATA:  Shortness of breath.  Recent knee surgery. EXAM: PORTABLE CHEST 1 VIEW COMPARISON:  Chest x-ray dated April 19, 2017. FINDINGS: Stable cardiomegaly. Increased interstitial markings at the lung bases. New dense retrocardiac consolidation and small left pleural effusion. Probable small right pleural effusion. No pneumothorax. No acute osseous abnormality. IMPRESSION: 1. Stable cardiomegaly with pulmonary interstitial edema and small left greater than right pleural effusions. 2. New dense retrocardiac consolidation, which could reflect atelectasis due to adjacent effusion, versus pneumonia. Consider dedicated PA and lateral chest x-rays for further evaluation. Electronically Signed   By: Obie Dredge M.D.   On: 04/27/2017 19:47    Cardiac Studies   None   Patient Profile     84 y.o. 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 Afib rate contol limited by soft BP continue lopressor tid  2. DCM CHF better with bid iv lasix I/o's positive will increase dose 3. BS:  Per IM service 4. Ortho:  Incision clean and dry pain control is good  Dr Anne Fu covering [facility] latter today as I am off  For questions or updates, please contact [facility] Please consult [email or web address] for contact info under Cardiology/STEMI.      Signed, Charlton Haws, MD  04/29/2017, 8:16 AM

## 2017-04-30 ENCOUNTER — Inpatient Hospital Stay (HOSPITAL_COMMUNITY): Payer: Medicare Other

## 2017-04-30 DIAGNOSIS — I340 Nonrheumatic mitral (valve) insufficiency: Secondary | ICD-10-CM

## 2017-04-30 DIAGNOSIS — I361 Nonrheumatic tricuspid (valve) insufficiency: Secondary | ICD-10-CM

## 2017-04-30 LAB — RENAL FUNCTION PANEL
Albumin: 3 g/dL — ABNORMAL LOW (ref 3.5–5.0)
Anion gap: 15 (ref 5–15)
BUN: 50 mg/dL — ABNORMAL HIGH (ref 6–20)
CO2: 21 mmol/L — ABNORMAL LOW (ref 22–32)
Calcium: 8.7 mg/dL — ABNORMAL LOW (ref 8.9–10.3)
Chloride: 92 mmol/L — ABNORMAL LOW (ref 101–111)
Creatinine, Ser: 1.43 mg/dL — ABNORMAL HIGH (ref 0.44–1.00)
GFR calc Af Amer: 38 mL/min — ABNORMAL LOW (ref >60)
GFR calc non Af Amer: 33 mL/min — ABNORMAL LOW (ref >60)
Glucose, Bld: 121 mg/dL — ABNORMAL HIGH (ref 65–99)
Phosphorus: 3.6 mg/dL (ref 2.5–4.6)
Potassium: 4.5 mmol/L (ref 3.5–5.1)
Sodium: 128 mmol/L — ABNORMAL LOW (ref 135–145)

## 2017-04-30 LAB — GLUCOSE, CAPILLARY: Glucose-Capillary: 133 mg/dL — ABNORMAL HIGH (ref 65–99)

## 2017-04-30 MED ORDER — PERFLUTREN LIPID MICROSPHERE
1.0000 mL | INTRAVENOUS | Status: AC | PRN
  Administered 2017-04-30: 2 mL via INTRAVENOUS
  Filled 2017-04-30: qty 10

## 2017-04-30 MED ORDER — METOPROLOL TARTRATE 50 MG PO TABS
50.0000 mg | ORAL_TABLET | Freq: Three times a day (TID) | ORAL | Status: DC
  Administered 2017-04-30 – 2017-05-01 (×3): 50 mg via ORAL
  Filled 2017-04-30 (×3): qty 1

## 2017-04-30 NOTE — Progress Notes (Signed)
PROGRESS NOTE    [name]  ZOX:[id number] DOB: [birth year] DOA: 04/25/2017 PCP: [name], MD  Outpatient Specialists:   Brief Narrative: Patient is an 84 y.o. Caucasian female with past medical history significant for CHF, possibly combined systolic and diastolic; atrial fibrillation with RVR currently being managed by Cardiology team, severe left knee DJD for which the patient underwent left total knee arthroplasty on 04/25/2017. Post op, patient has been in atrial fibrillation with rapid ventricular response and the Cardiology team is managing. Patient has been requiring supplemental oxygen, especially with ambulation to maintain Oxygen saturation greater than 91%. No pleuritic chest pain. Patient denies orthopnea, and no significant leg edema, though, patient has been on Torsemide. No history of Tobacco use or exposure to second hand smoking. CXR done on 04/19/17 was interpreted as hyperinflated lungs with chronic bronchitic and interstitial changes ("question fibrosis"), enlarged cardiac silhouette with slight pulmonary vascular congestion. Repeat CXR revealed left pleural effusion and atelectasis. BNP is elevated. No recent ECHO. Oral Torsemide is changed to PO Lasix. VQ Scan is low probability to PE.  04/29/17 - Patient seen alongside patient's daughter. Patient oxygen requirement is decreasing. Patient remains in afib RVR - Cardiology is managing.  04/30/17 - Patient seen. Nil new complaints. SOB is improving.  Assessment & Plan:   - Acute Respiratory failure, likely multifactorial, likely related to CHF, dense atelectasis and right pleural effusion.   - CHF.   - Atrial fibrillation with RVR   - Severe Atelectasis    - Primary osteoarthritis of left knee S/P total knee arthroplasty     - Possible previously undiagosed chronic lung disease/fibrosis   Afib with RVR as per Cardiology Continue IV Lasix as per Cardiology. Chest PT Incentive spirometry Repeat CXR reviewed VQ  scan result noted.   DVT prophylaxis: Apixaban Code Status: Full Family Communication:  Husband and Daughter Disposition Plan: Will depend on Ortho team.   Subjective: SOB is improving  Objective: Vitals:   04/29/17 2100 04/29/17 2311 04/30/17 0455 04/30/17 0800  BP: 93/76 (!) 85/61 91/75 100/71  Pulse: (!) 141   (!) 128  Resp: 20   18  Temp:  (!) 97.5 F (36.4 C) 97.6 F (36.4 C) 98.4 F (36.9 C)  TempSrc:  Oral Oral Oral  SpO2: 98%   94%  Weight:      Height:        Intake/Output Summary (Last 24 hours) at 04/30/2017 1209 Last data filed at 04/29/2017 1700 Gross per 24 hour  Intake 240 ml  Output -  Net 240 ml   Filed Weights   04/25/17 2235 04/28/17 0549  Weight: 84.5 kg (186 lb 4.6 oz) 86.3 kg (190 lb 4.1 oz)    Examination:  General exam: Appears calm and comfortable  Respiratory system: Decreased air entry. Cardiovascular system: S1 & S2. Minimal pedal edema. Gastrointestinal system: Abdomen is obese, soft and nontender.  Central nervous system: Alert and oriented. No focal neurological deficits. Extremities: Minimal pedal edema.  Data Reviewed: I have personally reviewed following labs and imaging studies  CBC: Recent Labs  Lab 04/27/17 1632  HGB 11.6*  HCT 35.7*   Basic Metabolic Panel: Recent Labs  Lab 04/27/17 1914 04/30/17 0357  NA 127* 128*  K 4.5 4.5  CL 92* 92*  CO2 21* 21*  GLUCOSE 166* 121*  BUN 29* 50*  CREATININE 1.64* 1.43*  CALCIUM 9.1 8.7*  MG 2.1  --   PHOS 3.4 3.6   GFR: Estimated Creatinine Clearance: 31.8  mL/min (A) (by C-G formula based on SCr of 1.43 mg/dL (H)). Liver Function Tests: Recent Labs  Lab 04/27/17 1914 04/30/17 0357  ALBUMIN 3.3* 3.0*   No results for input(s): LIPASE, AMYLASE in the last 168 hours. No results for input(s): AMMONIA in the last 168 hours. Coagulation Profile: Recent Labs  Lab 04/25/17 1030  INR 1.13   Cardiac Enzymes: No results for input(s): CKTOTAL, CKMB, CKMBINDEX,  TROPONINI in the last 168 hours. BNP (last 3 results) No results for input(s): PROBNP in the last 8760 hours. HbA1C: Recent Labs    04/27/17 1914  HGBA1C 5.3   CBG: Recent Labs  Lab 04/27/17 1540 04/27/17 2023 04/28/17 0838 04/29/17 0755 04/30/17 0826  GLUCAP 143* 147* 115* 115* 133*   Lipid Profile: No results for input(s): CHOL, HDL, LDLCALC, TRIG, CHOLHDL, LDLDIRECT in the last 72 hours. Thyroid Function Tests: No results for input(s): TSH, T4TOTAL, FREET4, T3FREE, THYROIDAB in the last 72 hours. Anemia Panel: No results for input(s): VITAMINB12, FOLATE, FERRITIN, TIBC, IRON, RETICCTPCT in the last 72 hours. Urine analysis:    Component Value Date/Time   COLORURINE YELLOW 04/19/2017 1027   APPEARANCEUR CLEAR 04/19/2017 1027   LABSPEC 1.006 04/19/2017 1027   PHURINE 6.0 04/19/2017 1027   GLUCOSEU NEGATIVE 04/19/2017 1027   HGBUR NEGATIVE 04/19/2017 1027   HGBUR moderate 06/01/2010 1102   BILIRUBINUR NEGATIVE 04/19/2017 1027   BILIRUBINUR Neg 07/10/2015 1216   KETONESUR NEGATIVE 04/19/2017 1027   PROTEINUR NEGATIVE 04/19/2017 1027   UROBILINOGEN 4.0 07/10/2015 1216   UROBILINOGEN 0.2 06/01/2010 1102   NITRITE NEGATIVE 04/19/2017 1027   LEUKOCYTESUR NEGATIVE 04/19/2017 1027   Sepsis Labs: @LABRCNTIP (procalcitonin:4,lacticidven:4)  ) No results found for this or any previous visit (from the past 240 hour(s)).       Radiology Studies: Nm Pulmonary Perf And Vent  Result Date: 04/28/2017 CLINICAL DATA:  Shortness of breath. History of CHF and atrial fibrillation. EXAM: NUCLEAR MEDICINE VENTILATION - PERFUSION LUNG SCAN TECHNIQUE: Ventilation images were obtained in multiple projections using inhaled aerosol Tc-5m DTPA. Perfusion images were obtained in multiple projections after intravenous injection of Tc-71m MAA. RADIOPHARMACEUTICALS:  31.4 mCi Technetium-33m DTPA aerosol inhalation and 4.0 mCi Technetium-56m MAA IV COMPARISON:  Chest x-ray 04/28/2017.  FINDINGS: Multiple bilateral prominent ventilatory defects. No significant perfusion defects. Low probability scan for pulmonary embolus. IMPRESSION: Low probability pulmonary embolus. Multiple bilateral ventilatory defects may be related to CHF noted on today's chest x-ray. Electronically Signed   By: Maisie Fus  Register   On: 04/28/2017 14:46        Scheduled Meds: . apixaban  2.5 mg Oral BID  . docusate sodium  100 mg Oral BID  . famotidine  20 mg Oral Daily  . furosemide  60 mg Intravenous BID  . mouth rinse  15 mL Mouth Rinse BID  . metoprolol tartrate  50 mg Oral TID  . sertraline  75 mg Oral Daily   Continuous Infusions: . lactated ringers Stopped (04/26/17 0300)     LOS: 5 days    Time spent: 40 Mins    Berton Mount, MD  [facility] Pager #: [phone] 7PM-7AM contact night coverage as above

## 2017-04-30 NOTE — Progress Notes (Signed)
   Patient doing well PO day 5 S/P L TKA per Dr Jerl Santos. Pt has done well form standpoint of her knee, PO course complicated by cardiac pathology and ARF as well as pulmonary issues. Reports passing gas and healthy appetite. Pt states she would prefer D/C home over SNF.   CBC Latest Ref Rng & Units 04/27/2017 04/19/2017 09/27/2016  WBC 4.0 - 10.5 K/uL - 8.2 6.8  Hemoglobin 12.0 - 15.0 g/dL 11.6(L) 12.4 12.4  Hematocrit 36.0 - 46.0 % 35.7(L) 39.2 37.5  Platelets 150 - 400 K/uL - 276 293.0   BMP Latest Ref Rng & Units 04/30/2017 04/27/2017 04/19/2017  Glucose 65 - 99 mg/dL 694(W) 546(E) 96  BUN 6 - 20 mg/dL 70(J) 50(K) 93(G)  Creatinine 0.44 - 1.00 mg/dL 1.82(X) 9.37(J) 6.96(V)  Sodium 135 - 145 mmol/L 128(L) 127(L) 136  Potassium 3.5 - 5.1 mmol/L 4.5 4.5 3.2(L)  Chloride 101 - 111 mmol/L 92(L) 92(L) 97(L)  CO2 22 - 32 mmol/L 21(L) 21(L) 29  Calcium 8.9 - 10.3 mg/dL 8.9(F) 9.1 9.2   BP 810/17 (BP Location: Right Arm)   Pulse (!) 128   Temp 98.4 F (36.9 C) (Oral)   Resp 18   Ht 5\' 5"  (1.651 m)   Wt 86.3 kg (190 lb 4.1 oz)   SpO2 94%   BMI 31.66 kg/m    Dressing in place, CDI, pt resting comfortably in bed, distal compartments soft and SCD's in place. NVI  POD #5 s/p L TKA per Dr Jerl Santos  - up with PT/OT, encourage ambulation             -Progress towards D/C home with [facility] vs SNF pending progress and recs             -WBAT - Continue current px regimen (tylenol), px minimal and well controlled - Eliquis for anticoag, home med - F/U in ortho office 2 weeks PO with Dr Jerl Santos  - From orthopaedic standpoint pt ready for D/C home with [facility] vs SNF pending PT recs. Will await release and recs from IM team and Cardiology, we appreciate their input and ongoing care  Pt Followed by IM and Cardiology for: -ARF, likely related to CHF, dense atelectasis and right pleural effusion. Followed by IM and Cardiology  -Atrial Fib with RVR in pt with CHF, followed by Cardiology  -  SevereAtelectasis, followed by IM -Possible previously undiagosed chronic lung disease/fibrosis

## 2017-04-30 NOTE — Progress Notes (Signed)
Per report from [place], pt has an enema ordered as needed and he offered to give her the enema around 2100, however pt refused at that time. Pt woke up around 0130 needing to use the bathroom. As pt was on the toilet, pt called for the RN to administer the enema. Pt is in Afib and her HR elevates as high 140s, RN did not feel comfortable administering enema at this time and paged on-call provider Bodenheimer, NP. Pt has dulcolax ordered PRN daily for moderate constipation which Bodenheimer recommended giving. While pt was on the toilet, she had a BM and an enema was not needed. This RN discussed concerns about administering an enema at 2am while she was in the bathroom off the monitor in afib with RVR with the patient and told pt of the doctors recommendations. Pt was agreeable to taking dulcolax after getting back into bed. No further issues noted. Will continue to monitor.

## 2017-04-30 NOTE — Progress Notes (Signed)
Pt refused soap and suds enema.

## 2017-04-30 NOTE — Progress Notes (Signed)
Physical Therapy Treatment Patient Details Name: [name] MRN: [id number] DOB: 1930-10-23 Today's Date: 04/30/2017    History of Present Illness Pt is an 84 year old female with chronic atrial fibrillation s/p L TKA on 04/25/17.  PMH includes failed TEE CV 06/19/15, NICM with an EF of 50% (echo 2017), chronic diastolic heart failure, LBBB, CHF.      PT Comments    Patient had been seen by Cardiology and adjustment made to medicines to try to lower HR without lowering BP too much. Patient agreeable to Lt TKR exercises. Ambulation not attempted due to HR up to 148 bpm with single LLE exercises. (Varied during treatment, mostly >125). Anticipate will need time in SNF for generalized weakness.    Follow Up Recommendations  SNF;Supervision/Assistance - 24 hour     Equipment Recommendations  None recommended by PT    Recommendations for Other Services       Precautions / Restrictions Precautions Precautions: Fall;Knee Restrictions Weight Bearing Restrictions: Yes LLE Weight Bearing: Weight bearing as tolerated    Mobility  Bed Mobility                  Transfers                 General transfer comment: Nursing had assisted pt to [facility] and then to recliner. Pt's HR 108-144 and she did not want to return to bed at this time.   Ambulation/Gait             General Gait Details: deferred   Stairs            Wheelchair Mobility    Modified Rankin (Stroke Patients Only)       Balance                                            Cognition Arousal/Alertness: Lethargic Behavior During Therapy: WFL for tasks assessed/performed Overall Cognitive Status: Within Functional Limits for tasks assessed                                        Exercises Total Joint Exercises Ankle Circles/Pumps: AROM;Left;20 reps Quad Sets: AROM;Left;10 reps Long Arc Quad: AROM;AAROM;Left;10 reps Knee Flexion: AROM;Left;5  reps;Seated Goniometric ROM: >90     General Comments        Pertinent Vitals/Pain Pain Assessment: 0-10 Pain Score: 4  Pain Location: L knee Pain Descriptors / Indicators: Sore Pain Intervention(s): Limited activity within patient's tolerance;Repositioned    Home Living                      Prior Function            PT Goals (current goals can now be found in the care plan section) Acute Rehab PT Goals Patient Stated Goal: to go home Progress towards PT goals: Not progressing toward goals - comment(due to afib with elevated HR/fatigue)    Frequency    7X/week      PT Plan Discharge plan needs to be updated    Co-evaluation              AM-PAC PT "6 Clicks" Daily Activity  Outcome Measure  Difficulty turning over in bed (including adjusting bedclothes, sheets and blankets)?: A Lot Difficulty  moving from lying on back to sitting on the side of the bed? : A Lot Difficulty sitting down on and standing up from a chair with arms (e.g., wheelchair, bedside commode, etc,.)?: A Lot Help needed moving to and from a bed to chair (including a wheelchair)?: A Lot Help needed walking in hospital room?: Total Help needed climbing 3-5 steps with a railing? : Total 6 Click Score: 10    End of Session   Activity Tolerance: Patient limited by fatigue Patient left: in chair;with call bell/phone within reach;with family/visitor present Nurse Communication: Other (comment)(pt's desire to stay in the recliner) PT Visit Diagnosis: Unsteadiness on feet (R26.81);Muscle weakness (generalized) (M62.81)     Time: 4010-2725 PT Time Calculation (min) (ACUTE ONLY): 13 min  Charges:  $Therapeutic Exercise: 8-22 mins                    G Codes:          [employer], PT 04/30/2017, 3:50 PM

## 2017-04-30 NOTE — Progress Notes (Signed)
Progress Note  Patient Name: [name] Date of Encounter: 04/30/2017  Primary Cardiologist: No primary care provider on file.   Subjective   Depressed feels weak Discussed need to go to rehab and not home   Inpatient Medications    Scheduled Meds: . apixaban  2.5 mg Oral BID  . docusate sodium  100 mg Oral BID  . famotidine  20 mg Oral Daily  . furosemide  60 mg Intravenous BID  . mouth rinse  15 mL Mouth Rinse BID  . metoprolol tartrate  50 mg Oral TID  . sertraline  75 mg Oral Daily   Continuous Infusions: . lactated ringers Stopped (04/26/17 0300)   PRN Meds: acetaminophen **OR** acetaminophen, alum & mag hydroxide-simeth, bisacodyl, diphenhydrAMINE, HYDROmorphone (DILAUDID) injection, menthol-cetylpyridinium **OR** phenol, metoCLOPramide **OR** metoCLOPramide (REGLAN) injection, ondansetron **OR** ondansetron (ZOFRAN) IV   Vital Signs    Vitals:   04/29/17 2100 04/29/17 2311 04/30/17 0455 04/30/17 0800  BP: 93/76 (!) 85/61 91/75 100/71  Pulse: (!) 141   (!) 128  Resp: 20   18  Temp:  (!) 97.5 F (36.4 C) 97.6 F (36.4 C) 98.4 F (36.9 C)  TempSrc:  Oral Oral Oral  SpO2: 98%   94%  Weight:      Height:        Intake/Output Summary (Last 24 hours) at 04/30/2017 1129 Last data filed at 04/29/2017 1700 Gross per 24 hour  Intake 240 ml  Output -  Net 240 ml   Filed Weights   04/25/17 2235 04/28/17 0549  Weight: 186 lb 4.6 oz (84.5 kg) 190 lb 4.1 oz (86.3 kg)    Telemetry    Afib up to 130's with standing  - Personally Reviewed  ECG    AFib LBBB chronic  - Personally Reviewed  Depressed  Pale elderly female  HEENT: normal Neck supple with no adenopathy JVP normal no bruits no thyromegaly Lungs clear with no wheezing and good diaphragmatic motion Heart:  S1/S2 no murmur, no rub, gallop or click PMI normal Abdomen: benighn, BS positve, no tenderness, no AAA no bruit.  No HSM or HJR Distal pulses intact with no bruits Plus one bilateral  edema Neuro non-focal Skin warm and dry Post left TKR clean incision    Labs    Chemistry Recent Labs  Lab 04/27/17 1914 04/30/17 0357  NA 127* 128*  K 4.5 4.5  CL 92* 92*  CO2 21* 21*  GLUCOSE 166* 121*  BUN 29* 50*  CREATININE 1.64* 1.43*  CALCIUM 9.1 8.7*  ALBUMIN 3.3* 3.0*  GFRNONAA 28* 33*  GFRAA 32* 38*  ANIONGAP 14 15     Hematology Recent Labs  Lab 04/27/17 1632  HGB 11.6*  HCT 35.7*    Cardiac EnzymesNo results for input(s): TROPONINI in the last 168 hours. No results for input(s): TROPIPOC in the last 168 hours.   BNP Recent Labs  Lab 04/27/17 1914  BNP 3,961.8*     DDimer No results for input(s): DDIMER in the last 168 hours.   Radiology    Nm Pulmonary Perf And Vent  Result Date: 04/28/2017 CLINICAL DATA:  Shortness of breath. History of CHF and atrial fibrillation. EXAM: NUCLEAR MEDICINE VENTILATION - PERFUSION LUNG SCAN TECHNIQUE: Ventilation images were obtained in multiple projections using inhaled aerosol Tc-61m DTPA. Perfusion images were obtained in multiple projections after intravenous injection of Tc-48m MAA. RADIOPHARMACEUTICALS:  31.4 mCi Technetium-48m DTPA aerosol inhalation and 4.0 mCi Technetium-22m MAA IV COMPARISON:  Chest x-ray 04/28/2017.  FINDINGS: Multiple bilateral prominent ventilatory defects. No significant perfusion defects. Low probability scan for pulmonary embolus. IMPRESSION: Low probability pulmonary embolus. Multiple bilateral ventilatory defects may be related to CHF noted on today's chest x-ray. Electronically Signed   By: Maisie Fus  Register   On: 04/28/2017 14:46    Cardiac Studies   None   Patient Profile     84 y.o. 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 Afib rate contol limited by soft BP increase lopressor to 50 tid  2. DCM CHF better with bid iv lasix I/O's  even yesterday  3. BS:  Per IM service 4. Ortho:  Incision clean and dry pain control is good  Charlton Haws

## 2017-04-30 NOTE — Progress Notes (Signed)
PT Cancellation Note  Patient Details Name: ALLIZON OLTHOFF MRN: [id number] DOB: 19-Nov-1930   Cancelled Treatment:    Reason Eval/Treat Not Completed: Medical issues which prohibited therapy   At rest, patient's HR fluctuating 117-148 bpm. Nursing reports when she stood her HR went up to 160s. RN reports cardiology has not yet seen pt today. Will attempt to see pt this afternoon if HR allows.    Scherrie November Prentiss Hammett, PT 04/30/2017, 10:01 AM

## 2017-04-30 NOTE — Progress Notes (Signed)
  Echocardiogram 2D Echocardiogram has been performed.  Roosvelt Maser F 04/30/2017, 5:25 PM

## 2017-05-01 DIAGNOSIS — I5041 Acute combined systolic (congestive) and diastolic (congestive) heart failure: Secondary | ICD-10-CM

## 2017-05-01 DIAGNOSIS — I480 Paroxysmal atrial fibrillation: Secondary | ICD-10-CM

## 2017-05-01 LAB — COMPREHENSIVE METABOLIC PANEL
ALT: 603 U/L — ABNORMAL HIGH (ref 14–54)
AST: 608 U/L — ABNORMAL HIGH (ref 15–41)
Albumin: 3.1 g/dL — ABNORMAL LOW (ref 3.5–5.0)
Alkaline Phosphatase: 147 U/L — ABNORMAL HIGH (ref 38–126)
Anion gap: 16 — ABNORMAL HIGH (ref 5–15)
BUN: 50 mg/dL — ABNORMAL HIGH (ref 6–20)
CO2: 22 mmol/L (ref 22–32)
Calcium: 8.6 mg/dL — ABNORMAL LOW (ref 8.9–10.3)
Chloride: 90 mmol/L — ABNORMAL LOW (ref 101–111)
Creatinine, Ser: 1.38 mg/dL — ABNORMAL HIGH (ref 0.44–1.00)
GFR calc Af Amer: 39 mL/min — ABNORMAL LOW (ref >60)
GFR calc non Af Amer: 34 mL/min — ABNORMAL LOW (ref >60)
Glucose, Bld: 125 mg/dL — ABNORMAL HIGH (ref 65–99)
Potassium: 4 mmol/L (ref 3.5–5.1)
Sodium: 128 mmol/L — ABNORMAL LOW (ref 135–145)
Total Bilirubin: 3 mg/dL — ABNORMAL HIGH (ref 0.3–1.2)
Total Protein: 5.6 g/dL — ABNORMAL LOW (ref 6.5–8.1)

## 2017-05-01 LAB — TSH: TSH: 2.685 u[IU]/mL (ref 0.350–4.500)

## 2017-05-01 LAB — ECHOCARDIOGRAM COMPLETE
Height: 65 in
Weight: 3044.11 oz

## 2017-05-01 LAB — BRAIN NATRIURETIC PEPTIDE: B Natriuretic Peptide: 4284.7 pg/mL — ABNORMAL HIGH (ref 0.0–100.0)

## 2017-05-01 LAB — GLUCOSE, CAPILLARY: Glucose-Capillary: 142 mg/dL — ABNORMAL HIGH (ref 65–99)

## 2017-05-01 MED ORDER — DIGOXIN 0.25 MG/ML IJ SOLN
0.3750 mg | Freq: Once | INTRAMUSCULAR | Status: AC
  Administered 2017-05-01: 0.375 mg via INTRAVENOUS
  Filled 2017-05-01: qty 2

## 2017-05-01 MED ORDER — DIGOXIN 0.25 MG/ML IJ SOLN: 0.1250 mg | Freq: Three times a day (TID) | INTRAMUSCULAR | Status: DC

## 2017-05-01 MED ORDER — AMIODARONE LOAD VIA INFUSION
150.0000 mg | Freq: Once | INTRAVENOUS | Status: AC
  Administered 2017-05-01: 150 mg via INTRAVENOUS
  Filled 2017-05-01: qty 83.34

## 2017-05-01 MED ORDER — AMIODARONE HCL IN DEXTROSE 360-4.14 MG/200ML-% IV SOLN: 30.0000 mg/h | INTRAVENOUS | Status: DC

## 2017-05-01 MED ORDER — AMIODARONE HCL IN DEXTROSE 360-4.14 MG/200ML-% IV SOLN
60.0000 mg/h | INTRAVENOUS | Status: DC
  Administered 2017-05-01 (×2): 60 mg/h via INTRAVENOUS
  Filled 2017-05-01: qty 200

## 2017-05-01 MED ORDER — METOPROLOL TARTRATE 25 MG PO TABS
25.0000 mg | ORAL_TABLET | Freq: Two times a day (BID) | ORAL | Status: DC
  Administered 2017-05-01 – 2017-05-02 (×3): 25 mg via ORAL
  Filled 2017-05-01 (×4): qty 1

## 2017-05-01 MED ORDER — DIGOXIN 0.25 MG/ML IJ SOLN
0.1250 mg | Freq: Once | INTRAMUSCULAR | Status: AC
  Administered 2017-05-02: 0.125 mg via INTRAVENOUS
  Filled 2017-05-01: qty 2

## 2017-05-01 NOTE — Progress Notes (Signed)
Soap suds enema given

## 2017-05-01 NOTE — Care Management Important Message (Signed)
Important Message  Patient Details  Name: [name] MRN: [id number] Date of Birth: Nov 03, 1930   Medicare Important Message Given:  Yes    Leone Haven, RN 05/01/2017, 4:07 PM

## 2017-05-01 NOTE — Progress Notes (Signed)
Patient was started on amiodarone with significant improvement in her heart rate to the 90s/100s.  However, she became hypotensive and nauseous.  BNP is >4000.  It seems that she is in decompensated heart failure.  We will reduce her metoprolol to 25mg  bid.  Discontinue amiodarone.  Start digoxin 0.375mg  x1 followed by 0.125mg  q8h x2 doses.  If no improvement by tomorrow she will need to escalate care to milrinone in order to diurese.  Afiya Ferrebee C. Duke Salvia, MD, [facility] 05/01/2017 4:02 PM

## 2017-05-01 NOTE — Progress Notes (Signed)
Subjective: 6 Days Post-Op Procedure(s) (LRB): TOTAL KNEE ARTHROPLASTY (Left)   Patient resting comfortably in bed this morning with husband at bedside. She is tryign to eat breakfast but stats she doesn't have much of an appetite and that she is having difficulty chewing and swallowing. She states that he knee is fine and that she has no pain in it.  Activity level:  wbat Diet tolerance:  Ok but may benefit from [employer] supplement.  Voiding:  ok Patient reports pain as mild.    Objective: Vital signs in last 24 hours: Temp:  [97.5 F (36.4 C)-98.5 F (36.9 C)] 97.5 F (36.4 C) (01/21 0700) Pulse Rate:  [26-136] 92 (01/21 0415) Resp:  [14-35] 14 (01/21 0415) BP: (94-119)/(63-89) 119/74 (01/21 0300) SpO2:  [96 %-100 %] 100 % (01/21 0415)  Labs: No results for input(s): HGB in the last 72 hours. No results for input(s): WBC, RBC, HCT, PLT in the last 72 hours. Recent Labs    04/30/17 0357  NA 128*  K 4.5  CL 92*  CO2 21*  BUN 50*  CREATININE 1.43*  GLUCOSE 121*  CALCIUM 8.7*   No results for input(s): LABPT, INR in the last 72 hours.  Physical Exam:  Neurologically intact ABD soft Neurovascular intact Sensation intact distally Intact pulses distally Dorsiflexion/Plantar flexion intact Incision: dressing C/D/I and no drainage No cellulitis present Compartment soft  Assessment/Plan:  6 Days Post-Op Procedure(s) (LRB): TOTAL KNEE ARTHROPLASTY (Left) Advance diet Up with therapy  We greatly appreciate medical and cardiology management and assistance. Continue on home Eliquis for a-fib and DVT prevention. Tylenol for pain. Encouraged incentive spirometer. Patient will likely need SNF for medical/cardiology management but we will leave that decision to medical/cardiology team. Follow up in office 2 weeks post op.  [name], [name] 05/01/2017, 9:15 AM

## 2017-05-01 NOTE — Progress Notes (Signed)
Progress Note  Patient Name: [name] Date of Encounter: 05/01/2017  Primary Cardiologist: No primary care provider on file.   Subjective   Feeling very tired.  Denies chest pain, shortness of breath or palpitations.    Inpatient Medications    Scheduled Meds: . apixaban  2.5 mg Oral BID  . docusate sodium  100 mg Oral BID  . famotidine  20 mg Oral Daily  . furosemide  60 mg Intravenous BID  . mouth rinse  15 mL Mouth Rinse BID  . metoprolol tartrate  50 mg Oral TID  . sertraline  75 mg Oral Daily   Continuous Infusions: . lactated ringers Stopped (04/26/17 0300)   PRN Meds: acetaminophen **OR** acetaminophen, alum & mag hydroxide-simeth, bisacodyl, diphenhydrAMINE, HYDROmorphone (DILAUDID) injection, menthol-cetylpyridinium **OR** phenol, metoCLOPramide **OR** metoCLOPramide (REGLAN) injection, ondansetron **OR** ondansetron (ZOFRAN) IV   Vital Signs    Vitals:   05/01/17 0100 05/01/17 0300 05/01/17 0415 05/01/17 0700  BP: 100/81 119/74    Pulse: 97 94 92   Resp: 15 16 14    Temp:  98 F (36.7 C) 98.5 F (36.9 C) (!) 97.5 F (36.4 C)  TempSrc:  Oral Rectal Oral  SpO2: 99% 100% 100%   Weight:      Height:        Intake/Output Summary (Last 24 hours) at 05/01/2017 1023 Last data filed at 04/30/2017 2249 Gross per 24 hour  Intake 120 ml  Output -  Net 120 ml   Filed Weights   04/25/17 2235 04/28/17 0549  Weight: 186 lb 4.6 oz (84.5 kg) 190 lb 4.1 oz (86.3 kg)    Telemetry    Atrial fibrillation. Rate 80s-130s.  - Personally Reviewed  ECG    n/a - Personally Reviewed  Physical Exam   VS:  BP 119/74 (BP Location: Right Arm)   Pulse 92   Temp (!) 97.5 F (36.4 C) (Oral)   Resp 14   Ht 5\' 5"  (1.651 m)   Wt 190 lb 4.1 oz (86.3 kg)   SpO2 100%   BMI 31.66 kg/m  , BMI Body mass index is 31.66 kg/m. GENERAL:  Well appearing.  Tired.   HEENT: Pupils equal round and reactive, fundi not visualized, oral mucosa unremarkable NECK:  No jugular  venous distention, waveform within normal limits, carotid upstroke brisk and symmetric, no bruits LUNGS:  Clear to auscultation bilaterally HEART:  Tachycardic.  Irregularly irregular.   PMI not displaced or sustained,S1 and S2 within normal limits, no S3, no S4, no clicks, no rubs, no murmurs ABD:  Flat, positive bowel sounds normal in frequency in pitch, no bruits, no rebound, no guarding, no midline pulsatile mass, no hepatomegaly, no splenomegaly EXT:  2 plus pulses throughout, 1+ LLE edema, no cyanosis no clubbing SKIN:  No rashes no nodules NEURO:  Cranial nerves II through XII grossly intact, motor grossly intact throughout [facility]:  Cognitively intact, oriented to person place and time   Labs    Chemistry Recent Labs  Lab 04/27/17 1914 04/30/17 0357  NA 127* 128*  K 4.5 4.5  CL 92* 92*  CO2 21* 21*  GLUCOSE 166* 121*  BUN 29* 50*  CREATININE 1.64* 1.43*  CALCIUM 9.1 8.7*  ALBUMIN 3.3* 3.0*  GFRNONAA 28* 33*  GFRAA 32* 38*  ANIONGAP 14 15     Hematology Recent Labs  Lab 04/27/17 1632  HGB 11.6*  HCT 35.7*    Cardiac EnzymesNo results for input(s): TROPONINI in the last 168 hours.  No results for input(s): TROPIPOC in the last 168 hours.   BNP Recent Labs  Lab 04/27/17 1914  BNP 3,961.8*     DDimer No results for input(s): DDIMER in the last 168 hours.   Radiology    No results found.  Cardiac Studies   Echo 04/30/17:  Study Conclusions  - Left ventricle: The cavity size was moderately dilated. Wall   thickness was normal. Systolic function was severely reduced. The   estimated ejection fraction was 15%. No mural thrombus with   Definity contrast. Diffuse hypokinesis. The study is not   technically sufficient to allow evaluation of LV diastolic   function. - Aortic valve: Mildly calcified leaflets. There was no stenosis.   There was no regurgitation. - Mitral valve: Mildly thickened leaflets . There was moderate   regurgitation. - Left atrium:  Severely dilated. - Right ventricle: The cavity size was mildly dilated. Mildly   reduced systolic function. - Right atrium: Severely dilated. - Tricuspid valve: There was moderate to severe regurgitation. - Pulmonary arteries: PA peak pressure: 45 mm Hg (S). - Inferior vena cava: The vessel was dilated. The respirophasic   diameter changes were blunted (< 50%), consistent with elevated   central venous pressure. - Pericardium, extracardiac: There was a left pleural effusion.  Impressions:  - Compared to a prior study in 06/2015, the LVEF has markedly   reduced from 50% to 15% with global hypokinesis.   Patient Profile     84 y.o. female with chronic diastolic heart failure here for L knee replacement when she developed atrial fibrillation with RVR.   Assessment & Plan    # Acute systolic and diastolic heart failure: LVEF reduced to 15% from 50% in 2017. [name] does not appear to be very volume overloaded on exam.  Ins and outs have not been recorded.  Nor has her weight.  We will change this to daily weights and strict I and O.  Continue lasix 60mg  IV bid and check renal function.  BUN is increasing and creatinine is getting better.  Will also check a BNP.  Metoprolol was increased to 50mg  q8h. she does report fatigue.  However her heart rates are poorly controlled.  Ultimately metoprolol should be switched to succinate given her reduced systolic function.  Ideally would like to lower the dose given her fatigue.  She is not receiving any other sedating medications.  We will start amiodarone IV.  If her heart rate is adequately controlled with this we can switch to digoxin.  Add ACE-I/ARB if BP allows.  She has no ischemic symptoms.  I suspect that her reduced systolic function is related to her rate.  Consider Lexiscan Myoview once stable.   # Moderate mitral regurgitation: # Severe tricuspid regurgitation:  Diuresis as above.  Check BMP.  # Atrial fibrillation with RVR:  Ms.  Eaton is on low dose Eliquis due to age and prior retroperitoneal bleed on heparin.     For questions or updates, please contact [facility] Please consult [email or web address] for contact info under Cardiology/STEMI.      Signed, [name], MD  05/01/2017, 10:23 AM

## 2017-05-01 NOTE — Progress Notes (Signed)
Physical Therapy Treatment Patient Details Name: [name] MRN: [id number] DOB: 25-Apr-1930 Today's Date: 05/01/2017    History of Present Illness Pt is an 84 year old female with chronic atrial fibrillation s/p L TKA on 04/25/17.  PMH includes failed TEE CV 06/19/15, NICM with an EF of 50% (echo 2017), chronic diastolic heart failure, LBBB, CHF.      PT Comments    Patient seen for activity progression. Patient ambulated short distance with RW today. Too fatigued to performed further activity at this time. Current POC remains appropriate.  OF NOTE: HR continues to fluctuate with elevation to upper 130s with activity, one peak elevation to 141.    Follow Up Recommendations  SNF;Supervision/Assistance - 24 hour     Equipment Recommendations  None recommended by PT    Recommendations for Other Services       Precautions / Restrictions Precautions Precautions: Fall;Knee Restrictions Weight Bearing Restrictions: Yes LLE Weight Bearing: Weight bearing as tolerated    Mobility  Bed Mobility Overal bed mobility: Needs Assistance Bed Mobility: Supine to Sit;Sit to Supine     Supine to sit: Min assist Sit to supine: Min assist   General bed mobility comments: increased time and effort, assist to elevate trunk and assist to return L LE back onto bed  Transfers Overall transfer level: Needs assistance Equipment used: Rolling walker (2 wheeled) Transfers: Sit to/from Stand Sit to Stand: Min assist         General transfer comment: Min assist to power up to standing from bed and BSC  Ambulation/Gait Ambulation/Gait assistance: Min assist Ambulation Distance (Feet): 60 Feet Assistive device: Rolling walker (2 wheeled) Gait Pattern/deviations: Step-through pattern;Decreased stride length Gait velocity: decreased Gait velocity interpretation: <1.8 ft/sec, indicative of risk for recurrent falls General Gait Details: deferred   Stairs            Wheelchair  Mobility    Modified Rankin (Stroke Patients Only)       Balance Overall balance assessment: Needs assistance Sitting-balance support: No upper extremity supported;Feet supported Sitting balance-Leahy Scale: Fair     Standing balance support: Bilateral upper extremity supported;During functional activity Standing balance-Leahy Scale: Poor Standing balance comment: reliance on UE support in standing                            Cognition Arousal/Alertness: Lethargic Behavior During Therapy: WFL for tasks assessed/performed Overall Cognitive Status: Within Functional Limits for tasks assessed                                        Exercises      General Comments General comments (skin integrity, edema, etc.): Hr continues to elevate to upper 130s with activity, peaked at 141      Pertinent Vitals/Pain Pain Assessment: 0-10 Pain Score: 3  Pain Location: L knee Pain Descriptors / Indicators: Sore Pain Intervention(s): Monitored during session    Home Living                      Prior Function            PT Goals (current goals can now be found in the care plan section) Acute Rehab PT Goals Patient Stated Goal: to go home PT Goal Formulation: With patient Time For Goal Achievement: 05/10/17 Potential to Achieve Goals: Good Progress towards  PT goals: Progressing toward goals(modest but limited by HR and activity tolerance)    Frequency    7X/week      PT Plan Discharge plan needs to be updated    Co-evaluation              AM-PAC PT "6 Clicks" Daily Activity  Outcome Measure  Difficulty turning over in bed (including adjusting bedclothes, sheets and blankets)?: A Lot Difficulty moving from lying on back to sitting on the side of the bed? : A Lot Difficulty sitting down on and standing up from a chair with arms (e.g., wheelchair, bedside commode, etc,.)?: A Lot Help needed moving to and from a bed to chair  (including a wheelchair)?: A Lot Help needed walking in hospital room?: Total Help needed climbing 3-5 steps with a railing? : Total 6 Click Score: 10    End of Session Equipment Utilized During Treatment: Gait belt;Oxygen Activity Tolerance: Patient limited by fatigue Patient left: in bed;with call bell/phone within reach;with family/visitor present Nurse Communication: Other (comment)(pt's desire to stay in the recliner) PT Visit Diagnosis: Unsteadiness on feet (R26.81);Muscle weakness (generalized) (M62.81)     Time: 1610-9604 PT Time Calculation (min) (ACUTE ONLY): 17 min  Charges:  $Gait Training: 8-22 mins                    G Codes:       Charlotte Crumb, PT DPT  Board Certified Neurologic Specialist [phone]    Fabio Asa 05/01/2017, 10:32 AM

## 2017-05-01 NOTE — Progress Notes (Signed)
PROGRESS NOTE    [name]  ZOX:[id number] DOB: December 21, 1930 DOA: 04/25/2017 PCP: [name], MD  Outpatient Specialists:   Brief Narrative: Patient is an 84 y.o. Caucasian female with past medical history significant for CHF, possibly combined systolic and diastolic; atrial fibrillation with RVR currently being managed by Cardiology team, severe left knee DJD for which the patient underwent left total knee arthroplasty on 04/25/2017. Post op, patient has been in atrial fibrillation with rapid ventricular response and the Cardiology team is managing. Patient has been requiring supplemental oxygen, especially with ambulation to maintain Oxygen saturation greater than 91%. No pleuritic chest pain. Patient denies orthopnea, and no significant leg edema, though, patient has been on Torsemide. No history of Tobacco use or exposure to second hand smoking. CXR done on 04/19/17 was interpreted as hyperinflated lungs with chronic bronchitic and interstitial changes ("question fibrosis"), enlarged cardiac silhouette with slight pulmonary vascular congestion. Repeat CXR revealed left pleural effusion and atelectasis. BNP is elevated. VQ Scan is low probability to PE.  ECHO revealed EF of 15%. Discussed with the Orthopedic Team, the Hospitalist team will sign off as patient is being followed by the Cardiology team. Kindly call us back with questions.  05/01/17 - Patient seen alongside patient's Husband and daughter. Discussed with patient's Nurse and Orthopedic PA. No new complaints. Cardiology is managing the CHF with Low EF and A fib.    Assessment & Plan:   - Acute Respiratory failure, likely multifactorial, likely related to CHF, dense atelectasis and right pleural effusion.   - CHF, with low EF, diastolic function could not be assessed.   - Atrial fibrillation with RVR   - Atelectasis   - Primary osteoarthritis of left knee S/P total knee arthroplasty        Afib with RVR as per  Cardiology Continue IV Lasix as per Cardiology. Chest PT Incentive spirometry Repeat CXR reviewed VQ scan result noted.  Hospitalist team will sign off.   DVT prophylaxis: Apixaban Code Status: Full Family Communication:  Husband and Daughter Disposition Plan: Will depend on Ortho team.   Subjective: No new complaints. Patient is not at her baseline  Objective: Vitals:   05/01/17 0415 05/01/17 0700 05/01/17 1000 05/01/17 1100  BP:   104/73 93/79  Pulse: 92  (!) 129 (!) 143  Resp: 14  (!) 22 (!) 24  Temp: 98.5 F (36.9 C) (!) 97.5 F (36.4 C)  (!) 97.4 F (36.3 C)  TempSrc: Rectal Oral  Oral  SpO2: 100%  100% 99%  Weight:      Height:        Intake/Output Summary (Last 24 hours) at 05/01/2017 1320 Last data filed at 04/30/2017 2249 Gross per 24 hour  Intake 120 ml  Output -  Net 120 ml   Filed Weights   04/25/17 2235 04/28/17 0549  Weight: 84.5 kg (186 lb 4.6 oz) 86.3 kg (190 lb 4.1 oz)    Examination:  General exam: Appears calm and comfortable  Respiratory system: Decreased air entry. Cardiovascular system: S1 & S2. Minimal pedal edema. Gastrointestinal system: Abdomen is obese, soft and nontender.  Central nervous system: Alert and oriented. No focal neurological deficits. Extremities: Minimal pedal edema.  Data Reviewed: I have personally reviewed following labs and imaging studies  CBC: Recent Labs  Lab 04/27/17 1632  HGB 11.6*  HCT 35.7*   Basic Metabolic Panel: Recent Labs  Lab 04/27/17 1914 04/30/17 0357  NA 127* 128*  K 4.5 4.5  CL 92* 92*  CO2 21* 21*  GLUCOSE 166* 121*  BUN 29* 50*  CREATININE 1.64* 1.43*  CALCIUM 9.1 8.7*  MG 2.1  --   PHOS 3.4 3.6   GFR: Estimated Creatinine Clearance: 31.8 mL/min (A) (by C-G formula based on SCr of 1.43 mg/dL (H)). Liver Function Tests: Recent Labs  Lab 04/27/17 1914 04/30/17 0357  ALBUMIN 3.3* 3.0*   No results for input(s): LIPASE, AMYLASE in the last 168 hours. No results for  input(s): AMMONIA in the last 168 hours. Coagulation Profile: Recent Labs  Lab 04/25/17 1030  INR 1.13   Cardiac Enzymes: No results for input(s): CKTOTAL, CKMB, CKMBINDEX, TROPONINI in the last 168 hours. BNP (last 3 results) No results for input(s): PROBNP in the last 8760 hours. HbA1C: No results for input(s): HGBA1C in the last 72 hours. CBG: Recent Labs  Lab 04/27/17 2023 04/28/17 0838 04/29/17 0755 04/30/17 0826 05/01/17 0735  GLUCAP 147* 115* 115* 133* 142*   Lipid Profile: No results for input(s): CHOL, HDL, LDLCALC, TRIG, CHOLHDL, LDLDIRECT in the last 72 hours. Thyroid Function Tests: No results for input(s): TSH, T4TOTAL, FREET4, T3FREE, THYROIDAB in the last 72 hours. Anemia Panel: No results for input(s): VITAMINB12, FOLATE, FERRITIN, TIBC, IRON, RETICCTPCT in the last 72 hours. Urine analysis:    Component Value Date/Time   COLORURINE YELLOW 04/19/2017 1027   APPEARANCEUR CLEAR 04/19/2017 1027   LABSPEC 1.006 04/19/2017 1027   PHURINE 6.0 04/19/2017 1027   GLUCOSEU NEGATIVE 04/19/2017 1027   HGBUR NEGATIVE 04/19/2017 1027   HGBUR moderate 06/01/2010 1102   BILIRUBINUR NEGATIVE 04/19/2017 1027   BILIRUBINUR Neg 07/10/2015 1216   KETONESUR NEGATIVE 04/19/2017 1027   PROTEINUR NEGATIVE 04/19/2017 1027   UROBILINOGEN 4.0 07/10/2015 1216   UROBILINOGEN 0.2 06/01/2010 1102   NITRITE NEGATIVE 04/19/2017 1027   LEUKOCYTESUR NEGATIVE 04/19/2017 1027   Sepsis Labs: @LABRCNTIP (procalcitonin:4,lacticidven:4)  ) No results found for this or any previous visit (from the past 240 hour(s)).       Radiology Studies: No results found.      Scheduled Meds: . amiodarone  150 mg Intravenous Once  . apixaban  2.5 mg Oral BID  . docusate sodium  100 mg Oral BID  . famotidine  20 mg Oral Daily  . furosemide  60 mg Intravenous BID  . mouth rinse  15 mL Mouth Rinse BID  . metoprolol tartrate  50 mg Oral TID  . sertraline  75 mg Oral Daily   Continuous  Infusions: . amiodarone 60 mg/hr (05/01/17 1226)   Followed by  . amiodarone    . lactated ringers Stopped (04/26/17 0300)     LOS: 6 days    Time spent: 30 Mins    Berton Mount, MD  [facility] Pager #: [phone] 7PM-7AM contact night coverage as above

## 2017-05-01 NOTE — Progress Notes (Signed)
When pt was leaving the restroom, pt got dizzy and had to sit down in the chair. Nurse tech and two nurses had to help pt get back in bed. Blood pressure was 85/59 with a MAP of 68. Pt stated "I do not feel well all over". Turned off Amiodarone drip and paged Dr. Duke Salvia with cardiology. Will continue to monitor pt and await orders.

## 2017-05-02 ENCOUNTER — Encounter (HOSPITAL_COMMUNITY): Payer: Self-pay | Admitting: Cardiovascular Disease

## 2017-05-02 DIAGNOSIS — R570 Cardiogenic shock: Secondary | ICD-10-CM

## 2017-05-02 DIAGNOSIS — I5041 Acute combined systolic (congestive) and diastolic (congestive) heart failure: Secondary | ICD-10-CM

## 2017-05-02 DIAGNOSIS — I4891 Unspecified atrial fibrillation: Secondary | ICD-10-CM

## 2017-05-02 HISTORY — DX: Acute combined systolic (congestive) and diastolic (congestive) heart failure: I50.41

## 2017-05-02 HISTORY — DX: Cardiogenic shock: R57.0

## 2017-05-02 LAB — BASIC METABOLIC PANEL
Anion gap: 12 (ref 5–15)
BUN: 47 mg/dL — ABNORMAL HIGH (ref 6–20)
CO2: 26 mmol/L (ref 22–32)
Calcium: 8.4 mg/dL — ABNORMAL LOW (ref 8.9–10.3)
Chloride: 92 mmol/L — ABNORMAL LOW (ref 101–111)
Creatinine, Ser: 1.33 mg/dL — ABNORMAL HIGH (ref 0.44–1.00)
GFR calc Af Amer: 41 mL/min — ABNORMAL LOW (ref >60)
GFR calc non Af Amer: 36 mL/min — ABNORMAL LOW (ref >60)
Glucose, Bld: 97 mg/dL (ref 65–99)
Potassium: 3.5 mmol/L (ref 3.5–5.1)
Sodium: 130 mmol/L — ABNORMAL LOW (ref 135–145)

## 2017-05-02 LAB — GLUCOSE, CAPILLARY: Glucose-Capillary: 116 mg/dL — ABNORMAL HIGH (ref 65–99)

## 2017-05-02 MED ORDER — POTASSIUM CHLORIDE CRYS ER 20 MEQ PO TBCR
20.0000 meq | EXTENDED_RELEASE_TABLET | Freq: Once | ORAL | Status: AC
  Administered 2017-05-02: 20 meq via ORAL
  Filled 2017-05-02: qty 1

## 2017-05-02 MED ORDER — DIGOXIN 125 MCG PO TABS
0.2500 mg | ORAL_TABLET | Freq: Every day | ORAL | Status: DC
Start: 2017-05-02 — End: 2017-05-06
  Administered 2017-05-02 – 2017-05-06 (×5): 0.25 mg via ORAL
  Filled 2017-05-02 (×5): qty 2

## 2017-05-02 MED ORDER — BISACODYL 10 MG RE SUPP
10.0000 mg | Freq: Every day | RECTAL | Status: DC | PRN
  Filled 2017-05-02: qty 1

## 2017-05-02 MED ORDER — FLEET ENEMA 7-19 GM/118ML RE ENEM
1.0000 | ENEMA | Freq: Every day | RECTAL | Status: DC | PRN
  Administered 2017-05-02: 1 via RECTAL
  Filled 2017-05-02: qty 1

## 2017-05-02 NOTE — Progress Notes (Signed)
Subjective: 7 Days Post-Op Procedure(s) (LRB): TOTAL KNEE ARTHROPLASTY (Left)   Patient feeling much better today since starting Digoxin. Family at bedside. Still no pain at her knee. She is currently up with PT.  Activity level:  wbat Diet tolerance:  ok Voiding:  Feels constipated but urinating Patient reports pain as mild.    Objective: Vital signs in last 24 hours: Temp:  [97.4 F (36.3 C)-98.7 F (37.1 C)] 98.1 F (36.7 C) (01/22 0814) Pulse Rate:  [46-143] 86 (01/22 0814) Resp:  [12-29] 19 (01/22 0814) BP: (75-126)/(52-92) 114/58 (01/22 0814) SpO2:  [93 %-100 %] 99 % (01/22 0814) Weight:  [86.4 kg (190 lb 7.6 oz)] 86.4 kg (190 lb 7.6 oz) (01/22 0300)  Labs: No results for input(s): HGB in the last 72 hours. No results for input(s): WBC, RBC, HCT, PLT in the last 72 hours. Recent Labs    05/01/17 1230 05/02/17 0615  NA 128* 130*  K 4.0 3.5  CL 90* 92*  CO2 22 26  BUN 50* 47*  CREATININE 1.38* 1.33*  GLUCOSE 125* 97  CALCIUM 8.6* 8.4*   No results for input(s): LABPT, INR in the last 72 hours.  Physical Exam:  Neurologically intact ABD soft Neurovascular intact Sensation intact distally Intact pulses distally Dorsiflexion/Plantar flexion intact Incision: dressing C/D/I and no drainage No cellulitis present Compartment soft  Assessment/Plan:  7 Days Post-Op Procedure(s) (LRB): TOTAL KNEE ARTHROPLASTY (Left) Advance diet Up with therapy We greatly appreciate medical and cardiology management and assistance. Continue on home Eliquis for a-fib and DVT prevention. Tylenol for pain. Encouraged incentive spirometer. I have ordered a FLEET enema along with a Dulcolax suppository to help with constipation. We will leave discharge decisions regarding when and home vs SNF up to cardiology team as her heart is the limiting factor at this time.. Follow up in office 2 weeks post op.  Timmi Devora, Ginger Organ 05/02/2017, 9:46 AM

## 2017-05-02 NOTE — Progress Notes (Signed)
Progress Note  Patient Name: [name] Date of Encounter: 05/02/2017  Primary Cardiologist: No primary care provider on file.   Subjective   Feeling much better today.  Less sleepy and no nausea.  She wants to get up and walk today.  Inpatient Medications    Scheduled Meds: . apixaban  2.5 mg Oral BID  . docusate sodium  100 mg Oral BID  . famotidine  20 mg Oral Daily  . furosemide  60 mg Intravenous BID  . mouth rinse  15 mL Mouth Rinse BID  . metoprolol tartrate  25 mg Oral BID  . sertraline  75 mg Oral Daily   Continuous Infusions: . lactated ringers Stopped (04/26/17 0300)   PRN Meds: acetaminophen **OR** acetaminophen, alum & mag hydroxide-simeth, bisacodyl, diphenhydrAMINE, HYDROmorphone (DILAUDID) injection, menthol-cetylpyridinium **OR** phenol, metoCLOPramide **OR** metoCLOPramide (REGLAN) injection, ondansetron **OR** ondansetron (ZOFRAN) IV   Vital Signs    Vitals:   05/02/17 0000 05/02/17 0300 05/02/17 0400 05/02/17 0440  BP: 108/80 (!) 100/54 (!) 101/59 (!) 100/52  Pulse: 85 76 (!) 59 81  Resp: (!) 21 13 13 12   Temp:  98.7 F (37.1 C)    TempSrc:  Oral    SpO2: 99% 95% 95% 94%  Weight:  190 lb 7.6 oz (86.4 kg)    Height:        Intake/Output Summary (Last 24 hours) at 05/02/2017 0816 Last data filed at 05/02/2017 0400 Gross per 24 hour  Intake 673.44 ml  Output -  Net 673.44 ml   Filed Weights   04/25/17 2235 04/28/17 0549 05/02/17 0300  Weight: 186 lb 4.6 oz (84.5 kg) 190 lb 4.1 oz (86.3 kg) 190 lb 7.6 oz (86.4 kg)    Telemetry    Atrial fibrillation. Rate 80s-130s.  Currently in the 80s.- Personally Reviewed  ECG    n/a - Personally Reviewed  Physical Exam   VS:  BP (!) 100/52 (BP Location: Right Arm)   Pulse 81   Temp 98.7 F (37.1 C) (Oral)   Resp 12   Ht 5\' 5"  (1.651 m)   Wt 190 lb 7.6 oz (86.4 kg)   SpO2 94%   BMI 31.70 kg/m  , BMI Body mass index is 31.7 kg/m. GENERAL:  Well appearing.  No acute distress. HEENT:  Pupils equal round and reactive, fundi not visualized, oral mucosa unremarkable NECK:  No jugular venous distention, waveform within normal limits, carotid upstroke brisk and symmetric, no bruits LUNGS: Faint crackles at the left base. HEART:  Irregularly irregular.   PMI not displaced or sustained,S1 and S2 within normal limits, no S3, no S4, no clicks, no rubs, no murmurs ABD:  Flat, positive bowel sounds normal in frequency in pitch, no bruits, no rebound, no guarding, no midline pulsatile mass, no hepatomegaly, no splenomegaly EXT:  2 plus pulses throughout, 1+ LLE edema, no cyanosis no clubbing SKIN:  No rashes no nodules NEURO:  Cranial nerves II through XII grossly intact, motor grossly intact throughout [facility]:  Cognitively intact, oriented to person place and time   Labs    Chemistry Recent Labs  Lab 04/27/17 1914 04/30/17 0357 05/01/17 1230 05/02/17 0615  NA 127* 128* 128* 130*  K 4.5 4.5 4.0 3.5  CL 92* 92* 90* 92*  CO2 21* 21* 22 26  GLUCOSE 166* 121* 125* 97  BUN 29* 50* 50* 47*  CREATININE 1.64* 1.43* 1.38* 1.33*  CALCIUM 9.1 8.7* 8.6* 8.4*  PROT  --   --  5.6*  --  ALBUMIN 3.3* 3.0* 3.1*  --   AST  --   --  608*  --   ALT  --   --  603*  --   ALKPHOS  --   --  147*  --   BILITOT  --   --  3.0*  --   GFRNONAA 28* 33* 34* 36*  GFRAA 32* 38* 39* 41*  ANIONGAP 14 15 16* 12     Hematology Recent Labs  Lab 04/27/17 1632  HGB 11.6*  HCT 35.7*    Cardiac EnzymesNo results for input(s): TROPONINI in the last 168 hours. No results for input(s): TROPIPOC in the last 168 hours.   BNP Recent Labs  Lab 04/27/17 1914 05/01/17 1230  BNP 3,961.8* 4,284.7*     DDimer No results for input(s): DDIMER in the last 168 hours.   Radiology    No results found.  Cardiac Studies   Echo 04/30/17:  Study Conclusions  - Left ventricle: The cavity size was moderately dilated. Wall   thickness was normal. Systolic function was severely reduced. The   estimated  ejection fraction was 15%. No mural thrombus with   Definity contrast. Diffuse hypokinesis. The study is not   technically sufficient to allow evaluation of LV diastolic   function. - Aortic valve: Mildly calcified leaflets. There was no stenosis.   There was no regurgitation. - Mitral valve: Mildly thickened leaflets . There was moderate   regurgitation. - Left atrium: Severely dilated. - Right ventricle: The cavity size was mildly dilated. Mildly   reduced systolic function. - Right atrium: Severely dilated. - Tricuspid valve: There was moderate to severe regurgitation. - Pulmonary arteries: PA peak pressure: 45 mm Hg (S). - Inferior vena cava: The vessel was dilated. The respirophasic   diameter changes were blunted (< 50%), consistent with elevated   central venous pressure. - Pericardium, extracardiac: There was a left pleural effusion.  Impressions:  - Compared to a prior study in 06/2015, the LVEF has markedly   reduced from 50% to 15% with global hypokinesis.   Patient Profile     84 y.o. female with chronic diastolic heart failure here for L knee replacement when she developed atrial fibrillation with RVR.   Assessment & Plan    # Acute systolic and diastolic heart failure: LVEF reduced to 15% from 50% in 2017. [name] does not appear to be very volume overloaded on exam but BNP was over 4000 yesterday.  She reports that her baseline weight is in the 160s and she was 190 pounds today.  It is unclear if this is accurate.  We will ask her to weigh standing up with physical therapy today.  We have also asked for ins and outs to be recorded.  Continue Lasix 60 mg IV twice daily.  Supplement potassium.  Metoprolol was reduced to 25 mg twice daily due to hypotension, fatigue, and acute heart failure.  She was started on digoxin 1/21 due to inadequate rate control and intolerance of amiodarone.  She will need a Lexiscan Myoview once stable.   # Moderate mitral  regurgitation: # Severe tricuspid regurgitation:  Diuresis as above.    # Atrial fibrillation with RVR:  [name] is on low dose Eliquis due to age and prior retroperitoneal bleed on heparin.  Her heart rate was poorly controlled on 1/21 and we tried amiodarone.  However she developed nausea and hypotension so this was discontinued.  She was started on digoxin and metoprolol was reduced due  to fatigue and hypotension.  Her heart rate is much better controlled today.  Start digoxin 0.25 mg daily today.  Check level on 1/24.    For questions or updates, please contact [facility] Please consult [email or web address] for contact info under Cardiology/STEMI.      Signed, [name], MD  05/02/2017, 8:16 AM

## 2017-05-02 NOTE — Progress Notes (Signed)
Subjective: 7 Days Post-Op Procedure(s) (LRB): TOTAL KNEE ARTHROPLASTY (Left)  Activity level:  OOB with PT Diet tolerance:  fair Voiding:  Well in BR Patient reports pain as mild.    Objective: Vital signs in last 24 hours: Temp:  [97.4 F (36.3 C)-98.7 F (37.1 C)] 98.1 F (36.7 C) (01/22 0814) Pulse Rate:  [46-143] 86 (01/22 0814) Resp:  [12-29] 19 (01/22 0814) BP: (75-126)/(52-92) 114/58 (01/22 0814) SpO2:  [93 %-100 %] 99 % (01/22 0814) Weight:  [86.4 kg (190 lb 7.6 oz)] 86.4 kg (190 lb 7.6 oz) (01/22 0300)  Labs: No results for input(s): HGB in the last 72 hours. No results for input(s): WBC, RBC, HCT, PLT in the last 72 hours. Recent Labs    05/01/17 1230 05/02/17 0615  NA 128* 130*  K 4.0 3.5  CL 90* 92*  CO2 22 26  BUN 50* 47*  CREATININE 1.38* 1.33*  GLUCOSE 125* 97  CALCIUM 8.6* 8.4*   No results for input(s): LABPT, INR in the last 72 hours.  Physical Exam:  Neurologically intact ABD soft Sensation intact distally Intact pulses distally Compartment soft  Assessment/Plan:  7 Days Post-Op Procedure(s) (LRB): TOTAL KNEE ARTHROPLASTY (Left)   Up with therapy  Enema for constipation Appreciate cardiology assistance with rate currently well controlled and energy returning Plan DC home or perhaps to rehab depending on continued progress Met with husband and children    [name] 05/02/2017, 9:46 AM

## 2017-05-02 NOTE — Progress Notes (Signed)
Physical Therapy Treatment Patient Details Name: [name] MRN: [id number] DOB: [birth year] Today's Date: 05/02/2017    History of Present Illness Pt is an 84 year old female with chronic atrial fibrillation s/p L TKA on 04/25/17.  PMH includes failed TEE CV 06/19/15, NICM with an EF of 50% (echo 2017), chronic diastolic heart failure, LBBB, CHF.      PT Comments    Pt feeling much better today. She reports she is still lacking motivation. Agreeable to exercises in bed and ambulation in room. Min assist required for bed mobility and transfers. Pt mobilized on RA with sats in upper 90s. VSS throughout. Pt in recliner at end of session.     Follow Up Recommendations  SNF;Supervision/Assistance - 24 hour     Equipment Recommendations  None recommended by PT    Recommendations for Other Services       Precautions / Restrictions Precautions Precautions: Fall;Knee Restrictions Weight Bearing Restrictions: Yes LLE Weight Bearing: Weight bearing as tolerated    Mobility  Bed Mobility Overal bed mobility: Needs Assistance Bed Mobility: Supine to Sit     Supine to sit: Min assist;HOB elevated     General bed mobility comments: +rail, increased time and effort  Transfers Overall transfer level: Needs assistance Equipment used: Rolling walker (2 wheeled) Transfers: Sit to/from Stand Sit to Stand: Min assist         General transfer comment: verbal cues for hand placement, assist to power up from bed and BSC  Ambulation/Gait Ambulation/Gait assistance: Min guard Ambulation Distance (Feet): 15 Feet Assistive device: Rolling walker (2 wheeled) Gait Pattern/deviations: Step-through pattern;Decreased stride length;Trunk flexed Gait velocity: decreased Gait velocity interpretation: Below normal speed for age/gender General Gait Details: verbal cues for posture   Stairs            Wheelchair Mobility    Modified Rankin (Stroke Patients Only)       Balance                                             Cognition Arousal/Alertness: Awake/alert Behavior During Therapy: WFL for tasks assessed/performed Overall Cognitive Status: Within Functional Limits for tasks assessed                                        Exercises Total Joint Exercises Ankle Circles/Pumps: AROM;Both;10 reps;Supine Quad Sets: AROM;Left;10 reps;Supine Heel Slides: AROM;Left;10 reps;Supine Goniometric ROM: 0-90 L knee    General Comments        Pertinent Vitals/Pain Pain Assessment: 0-10 Pain Score: 2  Pain Location: L knee Pain Descriptors / Indicators: Sore Pain Intervention(s): Monitored during session    Home Living                      Prior Function            PT Goals (current goals can now be found in the care plan section) Acute Rehab PT Goals Patient Stated Goal: to go home PT Goal Formulation: With patient Time For Goal Achievement: 05/10/17 Potential to Achieve Goals: Good Progress towards PT goals: Progressing toward goals    Frequency    7X/week      PT Plan Current plan remains appropriate    Co-evaluation  AM-PAC PT "6 Clicks" Daily Activity  Outcome Measure  Difficulty turning over in bed (including adjusting bedclothes, sheets and blankets)?: A Lot Difficulty moving from lying on back to sitting on the side of the bed? : A Lot Difficulty sitting down on and standing up from a chair with arms (e.g., wheelchair, bedside commode, etc,.)?: A Lot Help needed moving to and from a bed to chair (including a wheelchair)?: A Little Help needed walking in hospital room?: A Little Help needed climbing 3-5 steps with a railing? : A Lot 6 Click Score: 14    End of Session Equipment Utilized During Treatment: Gait belt Activity Tolerance: Patient tolerated treatment well Patient left: in chair;with call bell/phone within reach;with family/visitor present Nurse Communication:  Mobility status PT Visit Diagnosis: Unsteadiness on feet (R26.81);Muscle weakness (generalized) (M62.81)     Time: 5427-0623 PT Time Calculation (min) (ACUTE ONLY): 25 min  Charges:  $Therapeutic Exercise: 8-22 mins $Therapeutic Activity: 8-22 mins                    G Codes:       Aida Raider, PT  Office # [phone] Pager [phone]    Ilda Foil 05/02/2017, 10:21 AM

## 2017-05-02 NOTE — NC FL2 (Signed)
[place] MEDICAID FL2 LEVEL OF CARE SCREENING TOOL     IDENTIFICATION  Patient Name: [name] Birthdate: April 21, 1930 Sex: female Admission Date (Current Location): 04/25/2017  [facility] and [place] Number:  Producer, television/film/video and Address:  [facility] [name], [address], [place], [place] [address]      Provider Number: [id number]  Attending Physician Name and Address:  Rama, Maryruth Bun, MD  Relative Name and Phone Number:       Current Level of Care: Hospital Recommended Level of Care: Skilled Nursing Facility Prior Approval Number:    Date Approved/Denied:   PASRR Number: [id number]  Discharge Plan: SNF    Current Diagnoses: Patient Active Problem List   Diagnosis Date Noted  . Acute combined systolic and diastolic heart failure (HCC) 05/02/2017  . Cardiogenic shock (HCC) 05/02/2017  . Primary osteoarthritis of left knee 04/25/2017  . DJD (degenerative joint disease) 04/25/2017  . Healthcare maintenance 02/08/2017  . Vitamin D deficiency 02/08/2017  . Acute diarrhea 09/27/2016  . IBS (irritable bowel syndrome) 08/31/2016  . At risk for falling 01/07/2016  . Chronic diastolic CHF (congestive heart failure), NYHA class 2 (HCC)   . Rectus sheath hematoma- no anticoagulation   . Atrial fibrillation with RVR- CHADs VASc=4 06/18/2015  . Diarrhea 06/12/2015  . Advance care planning 10/23/2014  . Leg edema 06/27/2012  . Medicare annual wellness visit, subsequent 03/27/2012  . Depression 03/05/2012  . EDEMA 06/07/2010  . Osteoporosis 05/23/2010  . KNEE PAIN, LEFT 03/02/2010  . LBBB (left bundle branch block) 10/28/2008  . POST MI SEPTAL DEFECT 06/24/2008  . Cough 06/24/2008  . Congestive dilated cardiomyopathy (HCC) 05/30/2008  . ATRIAL FIBRILLATION 05/30/2008  . UNSPECIFIED HEMORRHAGE 05/30/2008  . HYPONATREMIA, HX OF 05/30/2008    Orientation RESPIRATION BLADDER Height & Weight        Normal Continent Weight: 190 lb 7.6 oz (86.4  kg) Height:  5\' 5"  (165.1 cm)  BEHAVIORAL SYMPTOMS/MOOD NEUROLOGICAL BOWEL NUTRITION STATUS      Continent Diet(cardiac)  AMBULATORY STATUS COMMUNICATION OF NEEDS Skin   Extensive Assist Verbally Surgical wounds(on knee adhesive bandage)                       Personal Care Assistance Level of Assistance  Bathing, Dressing Bathing Assistance: Maximum assistance   Dressing Assistance: Maximum assistance     Functional Limitations Info             SPECIAL CARE FACTORS FREQUENCY  PT (By licensed PT), OT (By licensed OT)     PT Frequency: 5/wk OT Frequency: 5/wk            Contractures      Additional Factors Info  Code Status, Allergies, Psychotropic Code Status Info: FULL Allergies Info: Ace Inhibitors, Warfarin Sodium, Codeine, Delsym Dextromethorphan Polistirex Er, Lactose Intolerance (Gi), Phenylephrine, Sulfamethoxazole-trimethoprim, Tramadol Psychotropic Info: zoloft         Current Medications (05/02/2017):  This is the current hospital active medication list Current Facility-Administered Medications  Medication Dose Route Frequency Provider Last Rate Last Dose  . acetaminophen (TYLENOL) tablet 650 mg  650 mg Oral Q4H PRN Elodia Florence, PA-C   650 mg at 04/30/17 2223   Or  . acetaminophen (TYLENOL) suppository 650 mg  650 mg Rectal Q4H PRN Elodia Florence, PA-C      . alum & mag hydroxide-simeth (MAALOX/MYLANTA) 200-200-20 MG/5ML suspension 30 mL  30 mL Oral Q4H PRN Elodia Florence, PA-C  30 mL at 04/26/17 0233  . apixaban (ELIQUIS) tablet 2.5 mg  2.5 mg Oral BID Elodia Florence, PA-C   2.5 mg at 05/02/17 0908  . bisacodyl (DULCOLAX) EC tablet 5 mg  5 mg Oral Daily PRN Elodia Florence, PA-C   5 mg at 05/01/17 2115  . bisacodyl (DULCOLAX) suppository 10 mg  10 mg Rectal Daily PRN Elodia Florence, PA-C      . digoxin (LANOXIN) tablet 0.25 mg  0.25 mg Oral Daily [name], MD   0.25 mg at 05/02/17 0908  . diphenhydrAMINE (BENADRYL) 12.5 MG/5ML elixir 12.5-25 mg   12.5-25 mg Oral Q4H PRN Elodia Florence, PA-C      . docusate sodium (COLACE) capsule 100 mg  100 mg Oral BID Elodia Florence, PA-C   100 mg at 05/02/17 0908  . famotidine (PEPCID) tablet 20 mg  20 mg Oral Daily Elodia Florence, PA-C   20 mg at 05/02/17 0908  . furosemide (LASIX) injection 60 mg  60 mg Intravenous BID Wendall Stade, MD   60 mg at 05/02/17 0909  . HYDROmorphone (DILAUDID) injection 0.5-1 mg  0.5-1 mg Intravenous Q3H PRN Elodia Florence, PA-C      . lactated ringers infusion   Intravenous Continuous Elodia Florence, PA-C   Stopped at 04/26/17 0300  . MEDLINE mouth rinse  15 mL Mouth Rinse BID [name], MD   15 mL at 05/01/17 2116  . menthol-cetylpyridinium (CEPACOL) lozenge 3 mg  1 lozenge Oral PRN Elodia Florence, PA-C       Or  . phenol (CHLORASEPTIC) mouth spray 1 spray  1 spray Mouth/Throat PRN Elodia Florence, PA-C      . metoCLOPramide (REGLAN) tablet 5-10 mg  5-10 mg Oral Q8H PRN Elodia Florence, PA-C       Or  . metoCLOPramide (REGLAN) injection 5-10 mg  5-10 mg Intravenous Q8H PRN Elodia Florence, PA-C   10 mg at 04/26/17 1223  . metoprolol tartrate (LOPRESSOR) tablet 25 mg  25 mg Oral BID [name], MD   25 mg at 05/02/17 0908  . ondansetron (ZOFRAN) tablet 4 mg  4 mg Oral Q6H PRN Elodia Florence, PA-C   4 mg at 04/26/17 1131   Or  . ondansetron (ZOFRAN) injection 4 mg  4 mg Intravenous Q6H PRN Elodia Florence, PA-C   4 mg at 05/01/17 1540  . sertraline (ZOLOFT) tablet 75 mg  75 mg Oral Daily Elodia Florence, PA-C   75 mg at 05/02/17 0908  . sodium phosphate (FLEET) 7-19 GM/118ML enema 1 enema  1 enema Rectal Daily PRN Elodia Florence, PA-C   1 enema at 05/02/17 1225     Discharge Medications: Please see discharge summary for a list of discharge medications.  Relevant Imaging Results:  Relevant Lab Results:   Additional Information SS#: [id number]  Burna Sis, LCSW

## 2017-05-02 NOTE — Progress Notes (Signed)
PT Cancellation Note  Patient Details Name: [name] MRN: [id number] DOB: [birth year]   Cancelled Treatment:    Reason Eval/Treat Not Completed: Medical issues which prohibited therapy. Attempted second PT session. Pt just received enema and is on BSC.    Ilda Foil 05/02/2017, 12:41 PM

## 2017-05-03 DIAGNOSIS — I447 Left bundle-branch block, unspecified: Secondary | ICD-10-CM

## 2017-05-03 LAB — BASIC METABOLIC PANEL
Anion gap: 13 (ref 5–15)
BUN: 31 mg/dL — ABNORMAL HIGH (ref 6–20)
CO2: 28 mmol/L (ref 22–32)
Calcium: 8.1 mg/dL — ABNORMAL LOW (ref 8.9–10.3)
Chloride: 95 mmol/L — ABNORMAL LOW (ref 101–111)
Creatinine, Ser: 1.03 mg/dL — ABNORMAL HIGH (ref 0.44–1.00)
GFR calc Af Amer: 56 mL/min — ABNORMAL LOW (ref >60)
GFR calc non Af Amer: 49 mL/min — ABNORMAL LOW (ref >60)
Glucose, Bld: 81 mg/dL (ref 65–99)
Potassium: 3.3 mmol/L — ABNORMAL LOW (ref 3.5–5.1)
Sodium: 136 mmol/L (ref 135–145)

## 2017-05-03 MED ORDER — METOPROLOL SUCCINATE ER 50 MG PO TB24
50.0000 mg | ORAL_TABLET | Freq: Every day | ORAL | Status: DC
  Administered 2017-05-03 – 2017-05-06 (×4): 50 mg via ORAL
  Filled 2017-05-03 (×5): qty 1

## 2017-05-03 MED ORDER — FUROSEMIDE 10 MG/ML IJ SOLN
60.0000 mg | Freq: Three times a day (TID) | INTRAMUSCULAR | Status: DC
  Administered 2017-05-03 – 2017-05-05 (×7): 60 mg via INTRAVENOUS
  Filled 2017-05-03 (×6): qty 6

## 2017-05-03 MED ORDER — POTASSIUM CHLORIDE CRYS ER 20 MEQ PO TBCR
20.0000 meq | EXTENDED_RELEASE_TABLET | Freq: Two times a day (BID) | ORAL | Status: DC
  Administered 2017-05-03 (×2): 20 meq via ORAL
  Filled 2017-05-03 (×2): qty 1

## 2017-05-03 MED ORDER — SERTRALINE HCL 50 MG PO TABS
50.0000 mg | ORAL_TABLET | Freq: Every day | ORAL | Status: DC
  Administered 2017-05-04 – 2017-05-06 (×3): 50 mg via ORAL
  Filled 2017-05-03 (×3): qty 1

## 2017-05-03 MED ORDER — SERTRALINE HCL 50 MG PO TABS: 25.0000 mg | ORAL_TABLET | Freq: Every day | ORAL | Status: DC

## 2017-05-03 MED ORDER — SERTRALINE HCL 50 MG PO TABS
25.0000 mg | ORAL_TABLET | Freq: Every day | ORAL | Status: DC
  Administered 2017-05-04 – 2017-05-05 (×2): 25 mg via ORAL
  Filled 2017-05-03 (×2): qty 1

## 2017-05-03 NOTE — Progress Notes (Signed)
Progress Note  Patient Name: [name] Date of Encounter: 05/03/2017  Primary Cardiologist: No primary care provider on file.   Subjective   [name] is doing much better.  She is breathing better and has no palpitations or chest pain.   Inpatient Medications    Scheduled Meds: . apixaban  2.5 mg Oral BID  . digoxin  0.25 mg Oral Daily  . docusate sodium  100 mg Oral BID  . famotidine  20 mg Oral Daily  . furosemide  60 mg Intravenous BID  . mouth rinse  15 mL Mouth Rinse BID  . metoprolol tartrate  25 mg Oral BID  . sertraline  75 mg Oral Daily   Continuous Infusions: . lactated ringers Stopped (04/26/17 0300)   PRN Meds: acetaminophen **OR** acetaminophen, alum & mag hydroxide-simeth, bisacodyl, bisacodyl, diphenhydrAMINE, HYDROmorphone (DILAUDID) injection, menthol-cetylpyridinium **OR** phenol, metoCLOPramide **OR** metoCLOPramide (REGLAN) injection, ondansetron **OR** ondansetron (ZOFRAN) IV, sodium phosphate   Vital Signs    Vitals:   05/02/17 1600 05/02/17 1944 05/02/17 2300 05/03/17 0500  BP: (!) 105/59 (!) 92/51 (!) 100/50 (!) 107/46  Pulse: 80 80 88 86  Resp: 13 18 18 18   Temp:  98.2 F (36.8 C)  98.4 F (36.9 C)  TempSrc:  Oral  Oral  SpO2: 93% 94% 98% 98%  Weight:    181 lb 10.5 oz (82.4 kg)  Height:        Intake/Output Summary (Last 24 hours) at 05/03/2017 0904 Last data filed at 05/03/2017 0500 Gross per 24 hour  Intake -  Output 1301 ml  Net -1301 ml   Filed Weights   04/28/17 0549 05/02/17 0300 05/03/17 0500  Weight: 190 lb 4.1 oz (86.3 kg) 190 lb 7.6 oz (86.4 kg) 181 lb 10.5 oz (82.4 kg)    Telemetry    Atrial fibrillation. Rate mostly 80s.  All <100 bpm  - Personally Reviewed  ECG    n/a - Personally Reviewed  Physical Exam   VS:  BP (!) 107/46   Pulse 86   Temp 98.4 F (36.9 C) (Oral)   Resp 18   Ht 5\' 5"  (1.651 m)   Wt 181 lb 10.5 oz (82.4 kg)   SpO2 98%   BMI 30.23 kg/m  , BMI Body mass index is 30.23  kg/m. GENERAL:  Well appearing.  No acute distress. HEENT: Pupils equal round and reactive, fundi not visualized, oral mucosa unremarkable NECK:  No jugular venous distention, waveform within normal limits, carotid upstroke brisk and symmetric, no bruits LUNGS: Bibasilar crackles. HEART:  Irregularly irregular.   PMI not displaced or sustained,S1 and S2 within normal limits, no S3, no S4, no clicks, no rubs, no murmurs ABD:  Flat, positive bowel sounds normal in frequency in pitch, no bruits, no rebound, no guarding, no midline pulsatile mass, no hepatomegaly, no splenomegaly EXT:  2 plus pulses throughout, 1+ LLE edema, no cyanosis no clubbing SKIN:  No rashes no nodules NEURO:  Cranial nerves II through XII grossly intact, motor grossly intact throughout [facility]:  Cognitively intact, oriented to person place and time   Labs    Chemistry Recent Labs  Lab 04/27/17 1914 04/30/17 0357 05/01/17 1230 05/02/17 0615 05/03/17 0535  NA 127* 128* 128* 130* 136  K 4.5 4.5 4.0 3.5 3.3*  CL 92* 92* 90* 92* 95*  CO2 21* 21* 22 26 28   GLUCOSE 166* 121* 125* 97 81  BUN 29* 50* 50* 47* 31*  CREATININE 1.64* 1.43* 1.38* 1.33*  1.03*  CALCIUM 9.1 8.7* 8.6* 8.4* 8.1*  PROT  --   --  5.6*  --   --   ALBUMIN 3.3* 3.0* 3.1*  --   --   AST  --   --  608*  --   --   ALT  --   --  603*  --   --   ALKPHOS  --   --  147*  --   --   BILITOT  --   --  3.0*  --   --   GFRNONAA 28* 33* 34* 36* 49*  GFRAA 32* 38* 39* 41* 56*  ANIONGAP 14 15 16* 12 13     Hematology Recent Labs  Lab 04/27/17 1632  HGB 11.6*  HCT 35.7*    Cardiac EnzymesNo results for input(s): TROPONINI in the last 168 hours. No results for input(s): TROPIPOC in the last 168 hours.   BNP Recent Labs  Lab 04/27/17 1914 05/01/17 1230  BNP 3,961.8* 4,284.7*     DDimer No results for input(s): DDIMER in the last 168 hours.   Radiology    No results found.  Cardiac Studies   Echo 04/30/17:  Study Conclusions  - Left  ventricle: The cavity size was moderately dilated. Wall   thickness was normal. Systolic function was severely reduced. The   estimated ejection fraction was 15%. No mural thrombus with   Definity contrast. Diffuse hypokinesis. The study is not   technically sufficient to allow evaluation of LV diastolic   function. - Aortic valve: Mildly calcified leaflets. There was no stenosis.   There was no regurgitation. - Mitral valve: Mildly thickened leaflets . There was moderate   regurgitation. - Left atrium: Severely dilated. - Right ventricle: The cavity size was mildly dilated. Mildly   reduced systolic function. - Right atrium: Severely dilated. - Tricuspid valve: There was moderate to severe regurgitation. - Pulmonary arteries: PA peak pressure: 45 mm Hg (S). - Inferior vena cava: The vessel was dilated. The respirophasic   diameter changes were blunted (< 50%), consistent with elevated   central venous pressure. - Pericardium, extracardiac: There was a left pleural effusion.  Impressions:  - Compared to a prior study in 06/2015, the LVEF has markedly   reduced from 50% to 15% with global hypokinesis.   Patient Profile     84 y.o. female with chronic diastolic heart failure here for L knee replacement when she developed atrial fibrillation with RVR.   Assessment & Plan    # Acute systolic and diastolic heart failure: LVEF reduced to 15% from 50% in 2017. She is improving with diuresis.  She was only net -1L yesterday.  Renal function improving with diuresis.  We will increase lasix to 60 mg tid.  Supplement potassium.  Switch metoprolol to succinate.    [name] does not appear to be very volume overloaded on exam but BNP was over 4000 yesterday.  She reports that her baseline weight is in the 160s and she was 190 pounds today.  It is unclear if this is accurate.  We will ask her to weigh standing up with physical therapy today.  We have also asked for ins and outs to be  recorded.  Continue Lasix 60 mg IV twice daily.  Supplement potassium.  Metoprolol was reduced to 25 mg twice daily due to hypotension, fatigue, and acute heart failure.  She was started on digoxin 1/21 due to inadequate rate control and intolerance of amiodarone.  She will  need a Lexiscan Myoview once stable.  Possibly tomorrow.   # Moderate mitral regurgitation: # Severe tricuspid regurgitation:  Diuresis as above.    # Atrial fibrillation with RVR:  Ms. Anthis is on low dose Eliquis due to age and prior retroperitoneal bleed on heparin.  Her heart rate was poorly controlled on 1/21 and we tried amiodarone.  However she developed nausea and hypotension so this was discontinued.  She was started on digoxin and metoprolol was reduced due to fatigue and hypotension.  Her heart rate is much better controlled today.  Continue digoxin.  Check level on 1/24.   For questions or updates, please contact [facility] Please consult [email or web address] for contact info under Cardiology/STEMI.      Signed, [name], MD  05/03/2017, 9:04 AM

## 2017-05-03 NOTE — Progress Notes (Signed)
Physical Therapy Treatment Patient Details Name: [name] MRN: [id number] DOB: 1930/06/27 Today's Date: 05/03/2017    History of Present Illness Pt is an 84 year old female with chronic atrial fibrillation s/p L TKA on 04/25/17.  Post op developed with RVR with her afib. PMH includes failed TEE CV 06/19/15, NICM with an EF of 50% (echo 2017), chronic diastolic heart failure, LBBB, CHF.      PT Comments    Pt making steady progress.   Follow Up Recommendations  Home health PT;Supervision for mobility/OOB     Equipment Recommendations  None recommended by PT    Recommendations for Other Services       Precautions / Restrictions Precautions Precautions: Fall;Knee Restrictions Weight Bearing Restrictions: Yes LLE Weight Bearing: Weight bearing as tolerated    Mobility  Bed Mobility Overal bed mobility: Needs Assistance Bed Mobility: Supine to Sit     Supine to sit: Supervision;HOB elevated     General bed mobility comments: Incr time and effort and rail  Transfers Overall transfer level: Needs assistance Equipment used: Rolling walker (2 wheeled) Transfers: Sit to/from Stand Sit to Stand: Supervision;Min assist         General transfer comment: Supervision from bed. Min assist from low toilet  Ambulation/Gait Ambulation/Gait assistance: Min guard Ambulation Distance (Feet): 12 Feet(x 2) Assistive device: Rolling walker (2 wheeled) Gait Pattern/deviations: Step-through pattern;Decreased stride length;Trunk flexed;Decreased stance time - left Gait velocity: decreased Gait velocity interpretation: Below normal speed for age/gender General Gait Details: Verbal cues to stand more erect and stay closer to walker   Stairs            Wheelchair Mobility    Modified Rankin (Stroke Patients Only)       Balance Overall balance assessment: Needs assistance Sitting-balance support: No upper extremity supported;Feet supported Sitting balance-Leahy  Scale: Fair     Standing balance support: During functional activity;Single extremity supported Standing balance-Leahy Scale: Poor Standing balance comment: reliance on UE support in standing                            Cognition Arousal/Alertness: Awake/alert Behavior During Therapy: WFL for tasks assessed/performed Overall Cognitive Status: Within Functional Limits for tasks assessed                                        Exercises Total Joint Exercises Ankle Circles/Pumps: AROM;Both;10 reps;Supine Quad Sets: AROM;Left;10 reps;Supine Heel Slides: Left;10 reps;Supine;AAROM Hip ABduction/ADduction: AAROM;Left;10 reps;Supine Straight Leg Raises: AAROM;Left;10 reps;Supine Long Arc Quad: AROM;Left;10 reps;Seated Knee Flexion: AROM;Left;Seated;10 reps Goniometric ROM: 0-90 Lt    General Comments        Pertinent Vitals/Pain Pain Assessment: Faces Faces Pain Scale: Hurts little more Pain Location: lt knee Pain Descriptors / Indicators: Sore Pain Intervention(s): Limited activity within patient's tolerance;Monitored during session;Repositioned    Home Living                      Prior Function            PT Goals (current goals can now be found in the care plan section) Progress towards PT goals: Progressing toward goals    Frequency    7X/week      PT Plan Discharge plan needs to be updated;Current plan remains appropriate    Co-evaluation  AM-PAC PT "6 Clicks" Daily Activity  Outcome Measure  Difficulty turning over in bed (including adjusting bedclothes, sheets and blankets)?: A Little Difficulty moving from lying on back to sitting on the side of the bed? : A Little Difficulty sitting down on and standing up from a chair with arms (e.g., wheelchair, bedside commode, etc,.)?: Unable Help needed moving to and from a bed to chair (including a wheelchair)?: A Little Help needed walking in hospital room?: A  Little Help needed climbing 3-5 steps with a railing? : A Lot 6 Click Score: 15    End of Session Equipment Utilized During Treatment: Gait belt Activity Tolerance: Patient tolerated treatment well Patient left: with call bell/phone within reach;with family/visitor present;in bed Nurse Communication: Mobility status PT Visit Diagnosis: Unsteadiness on feet (R26.81);Muscle weakness (generalized) (M62.81)     Time: 4174-0814 PT Time Calculation (min) (ACUTE ONLY): 19 min  Charges:  $Gait Training: 8-22 mins $Therapeutic Exercise: 8-22 mins                    G Codes:       [facility] PT [phone]    Angelina Ok [facility] 05/03/2017, 4:30 PM

## 2017-05-03 NOTE — Clinical Social Work Note (Signed)
Clinical Social Work Assessment  Patient Details  Name: [name] MRN: [id number] Date of Birth: [birth year]  Date of referral:  05/03/17               Reason for consult:  Facility Placement, Discharge Planning                Permission sought to share information with:  Facility Sport and exercise psychologist, Family Supports Permission granted to share information::  Yes, Verbal Permission Granted  Name::     Makalyn Lennox, Son (name unknown).  Agency::     Relationship::  Husband and son at bedside  Contact Information:     Housing/Transportation Living arrangements for the past 2 months:  Single Family Home Source of Information:  Patient, Medical Team, Adult Children, Spouse Patient Interpreter Needed:  None Criminal Activity/Legal Involvement Pertinent to Current Situation/Hospitalization:  No - Comment as needed Significant Relationships:  Adult Children, Spouse Lives with:  Spouse Do you feel safe going back to the place where you live?  Yes Need for family participation in patient care:  Yes (Comment)  Care giving concerns:  PT recommending SNF once medically stable for discharge.   Social Worker assessment / plan:  CSW met with patient. Husband and son at bedside. CSW introduced role and explained that PT recommendations would be discussed. Patient and her family prefer home health. Patient notes that she has a very supportive family that are willing to assist her. They are not currently set up with an agency but stated they just had multiple pieces of equipment delivered. Will notify RNCM. No further concerns. CSW signing off as social work intervention is no longer needed.  Employment status:  Retired Nurse, adult PT Recommendations:  [place] / Referral to community resources:  [place]  Patient/Family's Response to care:  Patient and her family prefer home health. Patient notes that her family is  very supportive. Patient and her family appreciated social work intervention.  Patient/Family's Understanding of and Emotional Response to Diagnosis, Current Treatment, and Prognosis:  Patient and her family have a good understanding of the reason for admission and her need for continued therapy once medically stable for discharge. Patient and her family appear happy with hospital care.  Emotional Assessment Appearance:  Appears stated age Attitude/Demeanor/Rapport:  Engaged, Gracious Affect (typically observed):  Accepting, Appropriate, Calm, Pleasant Orientation:  Oriented to Self, Oriented to Place, Oriented to  Time, Oriented to Situation Alcohol / Substance use:  Never Used Psych involvement (Current and /or in the community):  No (Comment)  Discharge Needs  Concerns to be addressed:  Care Coordination Readmission within the last 30 days:  No Current discharge risk:  Dependent with Mobility Barriers to Discharge:  Continued Medical Work up   Candie Chroman, LCSW 05/03/2017, 9:48 AM

## 2017-05-03 NOTE — Progress Notes (Signed)
Physical Therapy Treatment Patient Details Name: [name] MRN: [id number] DOB: February 16, 1931 Today's Date: 05/03/2017    History of Present Illness Pt is an 84 year old female with chronic atrial fibrillation s/p L TKA on 04/25/17.  Post op developed with RVR with her afib. PMH includes failed TEE CV 06/19/15, NICM with an EF of 50% (echo 2017), chronic diastolic heart failure, LBBB, CHF.      PT Comments    Pt making excellent progress with mobility. Of note HR <110 with amb. Pt and family comfortable with pt returning home and family able/willing to assist pt as needed.   Follow Up Recommendations  Home health PT;Supervision for mobility/OOB     Equipment Recommendations  None recommended by PT    Recommendations for Other Services       Precautions / Restrictions Precautions Precautions: Fall;Knee Restrictions Weight Bearing Restrictions: Yes LLE Weight Bearing: Weight bearing as tolerated    Mobility  Bed Mobility Overal bed mobility: Needs Assistance Bed Mobility: Supine to Sit     Supine to sit: Supervision;HOB elevated     General bed mobility comments: Incr time and effort and rail  Transfers Overall transfer level: Needs assistance Equipment used: Rolling walker (2 wheeled) Transfers: Sit to/from Stand Sit to Stand: Supervision         General transfer comment: Assist for safety  Ambulation/Gait Ambulation/Gait assistance: Supervision Ambulation Distance (Feet): 150 Feet Assistive device: Rolling walker (2 wheeled) Gait Pattern/deviations: Step-through pattern;Decreased stride length;Trunk flexed Gait velocity: decreased Gait velocity interpretation: Below normal speed for age/gender General Gait Details: Verbal cues to stand more erect and stay closer to walker   Stairs            Wheelchair Mobility    Modified Rankin (Stroke Patients Only)       Balance Overall balance assessment: Needs assistance Sitting-balance support: No  upper extremity supported;Feet supported Sitting balance-Leahy Scale: Fair     Standing balance support: During functional activity;Single extremity supported Standing balance-Leahy Scale: Poor Standing balance comment: reliance on UE support in standing                            Cognition Arousal/Alertness: Awake/alert Behavior During Therapy: WFL for tasks assessed/performed Overall Cognitive Status: Within Functional Limits for tasks assessed                                        Exercises Total Joint Exercises Long Arc Quad: AROM;Left;10 reps;Seated Knee Flexion: AROM;Left;Seated;10 reps Goniometric ROM: 0-90 Lt    General Comments        Pertinent Vitals/Pain Pain Assessment: No/denies pain    Home Living                      Prior Function            PT Goals (current goals can now be found in the care plan section) Progress towards PT goals: Progressing toward goals;Goals met and updated - see care plan    Frequency    7X/week      PT Plan Discharge plan needs to be updated    Co-evaluation              AM-PAC PT "6 Clicks" Daily Activity  Outcome Measure  Difficulty turning over in bed (including adjusting bedclothes, sheets and blankets)?:  A Little Difficulty moving from lying on back to sitting on the side of the bed? : A Little Difficulty sitting down on and standing up from a chair with arms (e.g., wheelchair, bedside commode, etc,.)?: A Little Help needed moving to and from a bed to chair (including a wheelchair)?: A Little Help needed walking in hospital room?: A Little Help needed climbing 3-5 steps with a railing? : A Lot 6 Click Score: 17    End of Session Equipment Utilized During Treatment: Gait belt Activity Tolerance: Patient tolerated treatment well Patient left: in chair;with call bell/phone within reach;with family/visitor present Nurse Communication: Mobility status PT Visit  Diagnosis: Unsteadiness on feet (R26.81);Muscle weakness (generalized) (M62.81)     Time: 1050-1120 PT Time Calculation (min) (ACUTE ONLY): 30 min  Charges:  $Gait Training: 8-22 mins $Therapeutic Exercise: 8-22 mins                    G Codes:       [facility] PT [place] 05/03/2017, 1:39 PM

## 2017-05-03 NOTE — Care Management Note (Signed)
Case Management Note  Patient Details  Name: [name] MRN: [id number] Date of Birth: [birth year]  Subjective/Objective:   Knee Arthroplasty                Action/Plan: Patient is arranged with [facility] for [facility] services, Ephriam Knuckles with [facility] [phone]) as been follow patient for discharge; DME - patient has a walker, shower chair and bedside commode at home  Expected Discharge Date:   possible 05/04/2017               Expected Discharge Plan:  Home w Home Health Services  Discharge planning Services  CM Consult  Post Acute Care Choice:  Home Health, Durable Medical Equipment Choice offered to:     DME Arranged:  3-N-1, Walker rolling DME Agency:  [employer]  HH Arranged:  PT HH Agency:  [facility]  Status of Service:  Completed, signed off  Reola Mosher [phone] 05/03/2017, 10:26 AM

## 2017-05-03 NOTE — Progress Notes (Signed)
Subjective: 8 Days Post-Op Procedure(s) (LRB): TOTAL KNEE ARTHROPLASTY (Left)   Patient feels much better this morning. She states she feels better than she has in months. Family is at bedside and agrees. She is looking forward to getting home and being active again.  Activity level:  wbat Diet tolerance: ok Voiding:  ok Patient reports pain as mild.    Objective: Vital signs in last 24 hours: Temp:  [98 F (36.7 C)-98.4 F (36.9 C)] 98.4 F (36.9 C) (01/23 0500) Pulse Rate:  [28-93] 86 (01/23 0500) Resp:  [13-21] 18 (01/23 0500) BP: (92-114)/(46-88) 107/46 (01/23 0500) SpO2:  [93 %-100 %] 98 % (01/23 0500) Weight:  [82.4 kg (181 lb 10.5 oz)] 82.4 kg (181 lb 10.5 oz) (01/23 0500)  Labs: No results for input(s): HGB in the last 72 hours. No results for input(s): WBC, RBC, HCT, PLT in the last 72 hours. Recent Labs    05/01/17 1230 05/02/17 0615  NA 128* 130*  K 4.0 3.5  CL 90* 92*  CO2 22 26  BUN 50* 47*  CREATININE 1.38* 1.33*  GLUCOSE 125* 97  CALCIUM 8.6* 8.4*   No results for input(s): LABPT, INR in the last 72 hours.  Physical Exam:  Neurologically intact ABD soft Neurovascular intact Sensation intact distally Intact pulses distally Dorsiflexion/Plantar flexion intact Incision: dressing C/D/I and no drainage No cellulitis present Compartment soft  Assessment/Plan:  8 Days Post-Op Procedure(s) (LRB): TOTAL KNEE ARTHROPLASTY (Left) Advance diet Up with therapy  We greatly appreciate medical and cardiology management and assistance. Continue on home Eliquis for a-fib and DVT prevention. Tylenol for pain. Encouraged incentive spirometer. We will leave discharge decisions regarding when and home vs SNF up to medicine/cardiology team as her heart is the limiting factor at this time. Follow up in office 2 weeks post op.   Miyani Cronic, Ginger [place] 05/03/2017, 8:08 AM

## 2017-05-04 ENCOUNTER — Inpatient Hospital Stay (HOSPITAL_COMMUNITY): Payer: Medicare Other

## 2017-05-04 DIAGNOSIS — I5043 Acute on chronic combined systolic (congestive) and diastolic (congestive) heart failure: Secondary | ICD-10-CM

## 2017-05-04 LAB — BASIC METABOLIC PANEL
Anion gap: 12 (ref 5–15)
BUN: 20 mg/dL (ref 6–20)
CO2: 31 mmol/L (ref 22–32)
Calcium: 8 mg/dL — ABNORMAL LOW (ref 8.9–10.3)
Chloride: 95 mmol/L — ABNORMAL LOW (ref 101–111)
Creatinine, Ser: 0.84 mg/dL (ref 0.44–1.00)
GFR calc Af Amer: >60 mL/min (ref >60)
GFR calc non Af Amer: >60 mL/min (ref >60)
Glucose, Bld: 89 mg/dL (ref 65–99)
Potassium: 3.4 mmol/L — ABNORMAL LOW (ref 3.5–5.1)
Sodium: 138 mmol/L (ref 135–145)

## 2017-05-04 LAB — DIGOXIN LEVEL: Digoxin Level: 1 ng/mL (ref 0.8–2.0)

## 2017-05-04 MED ORDER — TECHNETIUM TC 99M TETROFOSMIN IV KIT
30.0000 | PACK | Freq: Once | INTRAVENOUS | Status: AC | PRN
  Administered 2017-05-04: 30 via INTRAVENOUS

## 2017-05-04 MED ORDER — REGADENOSON 0.4 MG/5ML IV SOLN
INTRAVENOUS | Status: AC
  Administered 2017-05-04: 0.4 mg via INTRAVENOUS
  Filled 2017-05-04: qty 5

## 2017-05-04 MED ORDER — POTASSIUM CHLORIDE CRYS ER 20 MEQ PO TBCR
40.0000 meq | EXTENDED_RELEASE_TABLET | Freq: Two times a day (BID) | ORAL | Status: DC
  Administered 2017-05-04 – 2017-05-06 (×4): 40 meq via ORAL
  Filled 2017-05-04 (×4): qty 2

## 2017-05-04 MED ORDER — TECHNETIUM TC 99M TETROFOSMIN IV KIT
10.0000 | PACK | Freq: Once | INTRAVENOUS | Status: AC | PRN
  Administered 2017-05-04: 10 via INTRAVENOUS

## 2017-05-04 MED ORDER — REGADENOSON 0.4 MG/5ML IV SOLN
0.4000 mg | Freq: Once | INTRAVENOUS | Status: AC
  Administered 2017-05-04: 0.4 mg via INTRAVENOUS
  Filled 2017-05-04: qty 5

## 2017-05-04 NOTE — Progress Notes (Signed)
Progress Note  Patient Name: [name] Date of Encounter: 05/04/2017  Primary Cardiologist: No primary care provider on file.   Subjective   Doing well this morning. No dyspnea or chest pain. Tolerated stress portion of nuclear stress test ok.   Inpatient Medications    Scheduled Meds: . apixaban  2.5 mg Oral BID  . digoxin  0.25 mg Oral Daily  . docusate sodium  100 mg Oral BID  . famotidine  20 mg Oral Daily  . furosemide  60 mg Intravenous TID  . mouth rinse  15 mL Mouth Rinse BID  . metoprolol succinate  50 mg Oral Daily  . potassium chloride  20 mEq Oral BID  . sertraline  25 mg Oral QHS  . sertraline  50 mg Oral Daily   Continuous Infusions: . lactated ringers Stopped (04/26/17 0300)   PRN Meds: acetaminophen **OR** acetaminophen, alum & mag hydroxide-simeth, bisacodyl, bisacodyl, diphenhydrAMINE, HYDROmorphone (DILAUDID) injection, menthol-cetylpyridinium **OR** phenol, metoCLOPramide **OR** metoCLOPramide (REGLAN) injection, ondansetron **OR** ondansetron (ZOFRAN) IV, sodium phosphate   Vital Signs    Vitals:   05/03/17 1000 05/03/17 1939 05/04/17 0633 05/04/17 0900  BP: 107/65 (!) 110/59 120/65 123/70  Pulse: 90 88 78 79  Resp:  18 18   Temp:  98.4 F (36.9 C) 97.9 F (36.6 C)   TempSrc:  Oral Oral   SpO2: 97% 95% 95%   Weight:   180 lb 1.9 oz (81.7 kg)   Height:        Intake/Output Summary (Last 24 hours) at 05/04/2017 0947 Last data filed at 05/04/2017 0849 Gross per 24 hour  Intake 720 ml  Output 2950 ml  Net -2230 ml   Filed Weights   05/02/17 0300 05/03/17 0500 05/04/17 1735  Weight: 190 lb 7.6 oz (86.4 kg) 181 lb 10.5 oz (82.4 kg) 180 lb 1.9 oz (81.7 kg)    Telemetry    LBBB Atrial fibrillation with CVR - Personally Reviewed  ECG    LBBB Atrial fibrillation  - Personally Reviewed  Physical Exam   GEN: No acute distress.   Neck: No JVD Cardiac: irreguarlly irregular, regular rate Respiratory: Clear to auscultation  bilaterally. GI: Soft, nontender, non-distended  MS: No edema; No deformity. Neuro:  Nonfocal  Psych: Normal affect   Labs    Chemistry Recent Labs  Lab 04/27/17 1914 04/30/17 0357 05/01/17 1230 05/02/17 0615 05/03/17 0535 05/04/17 0434  NA 127* 128* 128* 130* 136 138  K 4.5 4.5 4.0 3.5 3.3* 3.4*  CL 92* 92* 90* 92* 95* 95*  CO2 21* 21* 22 26 28 31   GLUCOSE 166* 121* 125* 97 81 89  BUN 29* 50* 50* 47* 31* 20  CREATININE 1.64* 1.43* 1.38* 1.33* 1.03* 0.84  CALCIUM 9.1 8.7* 8.6* 8.4* 8.1* 8.0*  PROT  --   --  5.6*  --   --   --   ALBUMIN 3.3* 3.0* 3.1*  --   --   --   AST  --   --  608*  --   --   --   ALT  --   --  603*  --   --   --   ALKPHOS  --   --  147*  --   --   --   BILITOT  --   --  3.0*  --   --   --   GFRNONAA 28* 33* 34* 36* 49* >60  GFRAA 32* 38* 39* 41* 56* >60  ANIONGAP  14 15 16* 12 13 12      Hematology Recent Labs  Lab 04/27/17 1632  HGB 11.6*  HCT 35.7*    Cardiac EnzymesNo results for input(s): TROPONINI in the last 168 hours. No results for input(s): TROPIPOC in the last 168 hours.   BNP Recent Labs  Lab 04/27/17 1914 05/01/17 1230  BNP 3,961.8* 4,284.7*     DDimer No results for input(s): DDIMER in the last 168 hours.   Radiology    No results found.  Cardiac Studies   Echo 04/30/17:  Study Conclusions  - Left ventricle: The cavity size was moderately dilated. Wall thickness was normal. Systolic function was severely reduced. The estimated ejection fraction was 15%. No mural thrombus with Definity contrast. Diffuse hypokinesis. The study is not technically sufficient to allow evaluation of LV diastolic function. - Aortic valve: Mildly calcified leaflets. There was no stenosis. There was no regurgitation. - Mitral valve: Mildly thickened leaflets . There was moderate regurgitation. - Left atrium: Severely dilated. - Right ventricle: The cavity size was mildly dilated. Mildly reduced systolic function. -  Right atrium: Severely dilated. - Tricuspid valve: There was moderate to severe regurgitation. - Pulmonary arteries: PA peak pressure: 45 mm Hg (S). - Inferior vena cava: The vessel was dilated. The respirophasic diameter changes were blunted (<50%), consistent with elevated central venous pressure. - Pericardium, extracardiac: There was a left pleural effusion.  Impressions:  - Compared to a prior study in 06/2015, the LVEF has markedly reduced from 50% to 15% with global hypokinesis.   NST 05/04/17 - pending  Patient Profile     84 y.o. female with chronic diastolic heart failure here for L knee replacement when she developed atrial fibrillation with RVR.    Assessment & Plan    1. Acute on Chronic Combined Systolic and Diastolic HF: LVEF reduced to 15% from 50% in 2017. NST performed today to assess for underlying coronary ischemia. Results pending. Volume appears stable. No edema. Pt is resting flat in supine position in nuclear medicine w/o orthopnea. Good UOP yesterday -2.9 L out. Total I/os however still net +1.6L. Continue Lasix. She is on metoprolol for reduced EF and afib. BP will not tolerate ACE/ARB at this time. Renal function stable. SCr WNL at 0.84.  2. Atrial Fibrillation w/ RVR: rate is controlled in the 80s with metoprolol and digoxin. On eliquis for a/c.   3. Moderate Mitral Regurgitation:  4. Severe Tricuspid Regurgitation:   5. Knee OA: s/p TKR  6. Hypokalemia: K 3.4 today. Supplemental K-dur ordered.   For questions or updates, please contact [facility] Please consult [email or web address] for contact info under Cardiology/STEMI.      Signed, Robbie Lis, PA-C  05/04/2017, 9:47 AM

## 2017-05-04 NOTE — Progress Notes (Signed)
Physical Therapy Treatment Patient Details Name: NAASIA WEILBACHER MRN: [id number] DOB: 1930/10/04 Today's Date: 05/04/2017    History of Present Illness Pt is an 84 year old female with chronic atrial fibrillation s/p L TKA on 04/25/17.  Post op developed with RVR with her afib. PMH includes failed TEE CV 06/19/15, NICM with an EF of 50% (echo 2017), chronic diastolic heart failure, LBBB, CHF.      PT Comments    Pt performed gait and reviewed and completed supine and seated exercises.  Pt able to advance gait distance but HR elevated to 140 bpm.  Pt required cues for safety with mobility but continues to progress well.       Follow Up Recommendations  Home health PT;Supervision for mobility/OOB     Equipment Recommendations  None recommended by PT    Recommendations for Other Services       Precautions / Restrictions Precautions Precautions: Fall;Knee Precaution Booklet Issued: Yes (comment) Precaution Comments: precautions/positioning reviewed Restrictions Weight Bearing Restrictions: Yes LLE Weight Bearing: Weight bearing as tolerated    Mobility  Bed Mobility               General bed mobility comments: Pt in recliner on arrival.    Transfers Overall transfer level: Needs assistance Equipment used: Rolling walker (2 wheeled) Transfers: Sit to/from Stand Sit to Stand: Supervision;Min guard         General transfer comment: Supervision for sit to stand, Min guard for stand to sit due to poor eccentric loading.  Pt performed additional trial to practice hand placement when returning to seated surface.    Ambulation/Gait Ambulation/Gait assistance: Min guard Ambulation Distance (Feet): 100 Feet Assistive device: Rolling walker (2 wheeled) Gait Pattern/deviations: Step-through pattern;Decreased stride length;Trunk flexed;Decreased stance time - left Gait velocity: decreased Gait velocity interpretation: Below normal speed for age/gender General Gait Details:  VCs for RW proximity and staying in the RW with turns.  Good gait symmetry with step through pattern.     Stairs            Wheelchair Mobility    Modified Rankin (Stroke Patients Only)       Balance Overall balance assessment: Needs assistance   Sitting balance-Leahy Scale: Fair       Standing balance-Leahy Scale: Poor Standing balance comment: reliance on UE support in standing                            Cognition Arousal/Alertness: Awake/alert Behavior During Therapy: WFL for tasks assessed/performed Overall Cognitive Status: Within Functional Limits for tasks assessed                                        Exercises Total Joint Exercises Ankle Circles/Pumps: AROM;Both;10 reps;Supine Quad Sets: AROM;Left;10 reps;Supine Heel Slides: Left;10 reps;Supine;AAROM Hip ABduction/ADduction: AAROM;Left;10 reps;Supine Straight Leg Raises: AAROM;Left;10 reps;Supine Long Arc Quad: AROM;Left;10 reps;Seated    General Comments        Pertinent Vitals/Pain Pain Assessment: 0-10 Pain Score: 5  Pain Location: lt knee Pain Descriptors / Indicators: Sore Pain Intervention(s): Monitored during session;Repositioned;Ice applied;Patient requesting pain meds-RN notified    Home Living                      Prior Function            PT Goals (current  goals can now be found in the care plan section) Acute Rehab PT Goals Patient Stated Goal: to go home Potential to Achieve Goals: Good Progress towards PT goals: Progressing toward goals    Frequency    7X/week      PT Plan Current plan remains appropriate    Co-evaluation              AM-PAC PT "6 Clicks" Daily Activity  Outcome Measure  Difficulty turning over in bed (including adjusting bedclothes, sheets and blankets)?: A Little Difficulty moving from lying on back to sitting on the side of the bed? : A Little Difficulty sitting down on and standing up from a chair  with arms (e.g., wheelchair, bedside commode, etc,.)?: Unable Help needed moving to and from a bed to chair (including a wheelchair)?: A Little Help needed walking in hospital room?: A Little Help needed climbing 3-5 steps with a railing? : A Lot 6 Click Score: 15    End of Session Equipment Utilized During Treatment: Gait belt Activity Tolerance: Patient tolerated treatment well Patient left: with call bell/phone within reach;with family/visitor present;in bed Nurse Communication: Mobility status       Time: 6384-6659 PT Time Calculation (min) (ACUTE ONLY): 18 min  Charges:  $Gait Training: 8-22 mins                    G Codes:       Joycelyn Rua, PTA pager [phone]    Florestine Avers 05/04/2017, 12:17 PM

## 2017-05-04 NOTE — Progress Notes (Signed)
Pt stated that she washed earlier

## 2017-05-04 NOTE — Progress Notes (Signed)
Subjective: 9 Days Post-Op Procedure(s) (LRB): TOTAL KNEE ARTHROPLASTY (Left)   Patient continues to improve. She is hoping to go home Saturday. She is walking great with PT and has great motion.  Activity level:  wbat Diet tolerance:  ok Voiding:  ok Patient reports pain as mild.    Objective: Vital signs in last 24 hours: Temp:  [97.2 F (36.2 C)-98.4 F (36.9 C)] 97.2 F (36.2 C) (01/24 1300) Pulse Rate:  [78-90] 86 (01/24 1300) Resp:  [16-18] 16 (01/24 1300) BP: (110-123)/(59-78) 113/78 (01/24 1300) SpO2:  [95 %-96 %] 96 % (01/24 1300) Weight:  [81.7 kg (180 lb 1.9 oz)] 81.7 kg (180 lb 1.9 oz) (01/24 1314)  Labs: No results for input(s): HGB in the last 72 hours. No results for input(s): WBC, RBC, HCT, PLT in the last 72 hours. Recent Labs    05/03/17 0535 05/04/17 0434  NA 136 138  K 3.3* 3.4*  CL 95* 95*  CO2 28 31  BUN 31* 20  CREATININE 1.03* 0.84  GLUCOSE 81 89  CALCIUM 8.1* 8.0*   No results for input(s): LABPT, INR in the last 72 hours.  Physical Exam:  Neurologically intact ABD soft Neurovascular intact Sensation intact distally Intact pulses distally Dorsiflexion/Plantar flexion intact Incision: dressing C/D/I and no drainage No cellulitis present Compartment soft  Assessment/Plan:  9 Days Post-Op Procedure(s) (LRB): TOTAL KNEE ARTHROPLASTY (Left) Advance diet Up with therapy We greatly appreciate medical and cardiology management and assistance. Continue on home Eliquis for a-fib and DVT prevention. Tylenol for pain. We will leave discharge decisions regarding when and home vs SNF up tomedicine/cardiologyteam as her heart is the limiting factor at this time. Follow up in office 2 weeks post op.  Kelley Knoth, Ginger Organ 05/04/2017, 4:15 PM

## 2017-05-04 NOTE — Progress Notes (Signed)
Physical Therapy Treatment Patient Details Name: [name] MRN: [id number] DOB: [birth year] Today's Date: 05/04/2017    History of Present Illness Pt is an 84 year old female with chronic atrial fibrillation s/p L TKA on 04/25/17.  Post op developed with RVR with her afib. PMH includes failed TEE CV 06/19/15, NICM with an EF of 50% (echo 2017), chronic diastolic heart failure, LBBB, CHF.      PT Comments    Pt performed additional pm session including gait and exercise to L knee.  Pt required cues for safety but continues to progress well.  HR during gait 108-130bpm.     Follow Up Recommendations  Home health PT;Supervision for mobility/OOB     Equipment Recommendations       Recommendations for Other Services       Precautions / Restrictions Precautions Precautions: Fall;Knee Precaution Booklet Issued: Yes (comment) Precaution Comments: precautions/positioning reviewed Restrictions Weight Bearing Restrictions: Yes LLE Weight Bearing: Weight bearing as tolerated    Mobility  Bed Mobility Overal bed mobility: Needs Assistance Bed Mobility: Supine to Sit;Sit to Supine     Supine to sit: Modified independent (Device/Increase time) Sit to supine: Supervision   General bed mobility comments: Pt required cues for hand placement and strategy to return to bed.  Pt required use of head board rail to boost in supine.    Transfers Overall transfer level: Needs assistance Equipment used: Rolling walker (2 wheeled) Transfers: Sit to/from Stand Sit to Stand: Supervision         General transfer comment: Cues for hand placement to return to seated surface.    Ambulation/Gait Ambulation/Gait assistance: Min guard Ambulation Distance (Feet): 120 Feet Assistive device: Rolling walker (2 wheeled) Gait Pattern/deviations: Step-through pattern;Decreased stride length;Antalgic;Trunk flexed Gait velocity: decreased Gait velocity interpretation: Below normal speed for  age/gender General Gait Details: Pt more painful this pm. Cues for gait symmetry and weight shifting to normalize gait pattern.     Stairs            Wheelchair Mobility    Modified Rankin (Stroke Patients Only)       Balance Overall balance assessment: Needs assistance   Sitting balance-Leahy Scale: Fair       Standing balance-Leahy Scale: Poor Standing balance comment: reliance on UE support in standing                            Cognition Arousal/Alertness: Awake/alert Behavior During Therapy: WFL for tasks assessed/performed Overall Cognitive Status: Within Functional Limits for tasks assessed                                        Exercises Total Joint Exercises Ankle Circles/Pumps: AROM;Both;10 reps;Supine Quad Sets: AROM;Left;10 reps;Supine Heel Slides: Left;10 reps;Supine;AAROM Hip ABduction/ADduction: AAROM;Left;10 reps;Supine Straight Leg Raises: AAROM;Left;10 reps;Supine Long Arc Quad: AROM;Left;10 reps;Seated Goniometric ROM: 0-96 degrees Lt knee.      General Comments        Pertinent Vitals/Pain Pain Assessment: 0-10 Faces Pain Scale: Hurts little more Pain Location: lt knee Pain Descriptors / Indicators: Sore Pain Intervention(s): Monitored during session;Repositioned;Ice applied;Patient requesting pain meds-RN notified    Home Living                      Prior Function            PT  Goals (current goals can now be found in the care plan section) Acute Rehab PT Goals Patient Stated Goal: to go home Potential to Achieve Goals: Good Progress towards PT goals: Progressing toward goals    Frequency    7X/week      PT Plan Current plan remains appropriate    Co-evaluation              AM-PAC PT "6 Clicks" Daily Activity  Outcome Measure  Difficulty turning over in bed (including adjusting bedclothes, sheets and blankets)?: None Difficulty moving from lying on back to sitting on the  side of the bed? : None Difficulty sitting down on and standing up from a chair with arms (e.g., wheelchair, bedside commode, etc,.)?: A Little Help needed moving to and from a bed to chair (including a wheelchair)?: A Little Help needed walking in hospital room?: A Little Help needed climbing 3-5 steps with a railing? : A Little 6 Click Score: 20    End of Session Equipment Utilized During Treatment: Gait belt Activity Tolerance: Patient tolerated treatment well Patient left: with call bell/phone within reach;with family/visitor present;in bed Nurse Communication: Mobility status PT Visit Diagnosis: Unsteadiness on feet (R26.81);Muscle weakness (generalized) (M62.81)     Time: 8757-9728 PT Time Calculation (min) (ACUTE ONLY): 28 min  Charges:  $Gait Training: 8-22 mins $Therapeutic Exercise: 8-22 mins                    G Codes:       Joycelyn Rua, PTA pager [phone]    Florestine Avers 05/04/2017, 4:33 PM

## 2017-05-05 LAB — BASIC METABOLIC PANEL
Anion gap: 13 (ref 5–15)
BUN: 19 mg/dL (ref 6–20)
CO2: 30 mmol/L (ref 22–32)
Calcium: 8.5 mg/dL — ABNORMAL LOW (ref 8.9–10.3)
Chloride: 96 mmol/L — ABNORMAL LOW (ref 101–111)
Creatinine, Ser: 0.82 mg/dL (ref 0.44–1.00)
GFR calc Af Amer: >60 mL/min (ref >60)
GFR calc non Af Amer: >60 mL/min (ref >60)
Glucose, Bld: 93 mg/dL (ref 65–99)
Potassium: 3.6 mmol/L (ref 3.5–5.1)
Sodium: 139 mmol/L (ref 135–145)

## 2017-05-05 MED ORDER — LOSARTAN POTASSIUM 25 MG PO TABS
12.5000 mg | ORAL_TABLET | Freq: Every day | ORAL | Status: DC
  Administered 2017-05-05 – 2017-05-06 (×2): 12.5 mg via ORAL
  Filled 2017-05-05 (×2): qty 1

## 2017-05-05 MED ORDER — FUROSEMIDE 40 MG PO TABS
40.0000 mg | ORAL_TABLET | Freq: Two times a day (BID) | ORAL | Status: DC
  Administered 2017-05-05 – 2017-05-06 (×2): 40 mg via ORAL
  Filled 2017-05-05 (×2): qty 1

## 2017-05-05 NOTE — Progress Notes (Signed)
Physical Therapy Treatment Patient Details Name: [name] MRN: [id number] DOB: [birth year] Today's Date: 05/05/2017    History of Present Illness Pt is an 84 year old female with chronic atrial fibrillation s/p L TKA on 04/25/17.  Post op developed with RVR with her afib. PMH includes failed TEE CV 06/19/15, NICM with an EF of 50% (echo 2017), chronic diastolic heart failure, LBBB, CHF.      PT Comments    Pt performed gait and exercises during session this am.  Pt more painful but continues to progress as evident by increased gait distance and ROM.  Plan for tx this pm to continue PT per POC.     Follow Up Recommendations  Home health PT;Supervision for mobility/OOB     Equipment Recommendations  None recommended by PT    Recommendations for Other Services       Precautions / Restrictions Precautions Precautions: Fall;Knee Precaution Booklet Issued: Yes (comment) Precaution Comments: precautions/positioning reviewed Restrictions Weight Bearing Restrictions: Yes LLE Weight Bearing: Weight bearing as tolerated    Mobility  Bed Mobility               General bed mobility comments: Pt in recliner on arrival.    Transfers Overall transfer level: Needs assistance Equipment used: Rolling walker (2 wheeled) Transfers: Sit to/from Stand Sit to Stand: Supervision         General transfer comment: Pt presented with good hand placement to return to chair but cues for eccentric loading.    Ambulation/Gait Ambulation/Gait assistance: Min guard Ambulation Distance (Feet): 150 Feet Assistive device: Rolling walker (2 wheeled) Gait Pattern/deviations: Step-through pattern;Decreased stride length;Antalgic;Decreased stance time - left Gait velocity: decreased Gait velocity interpretation: Below normal speed for age/gender General Gait Details: Cues for gait symmetry with step through pattern.  Pt more painful and guarded, cues to relax shoulders.  Pt performed increased  gait and HR stable in high 90-low 100s, spike of 144bpm during gait but returned to baseline quickly.     Stairs            Wheelchair Mobility    Modified Rankin (Stroke Patients Only)       Balance Overall balance assessment: Needs assistance   Sitting balance-Leahy Scale: Fair       Standing balance-Leahy Scale: Poor Standing balance comment: reliance on UE support in standing                            Cognition Arousal/Alertness: Awake/alert Behavior During Therapy: WFL for tasks assessed/performed Overall Cognitive Status: Within Functional Limits for tasks assessed                                 General Comments: Pt in more pain this am.        Exercises Total Joint Exercises Ankle Circles/Pumps: AROM;Both;10 reps;Supine Quad Sets: AROM;Left;10 reps;Supine Heel Slides: Left;10 reps;Supine;AAROM Hip ABduction/ADduction: AAROM;Left;10 reps;Supine Straight Leg Raises: AAROM;Left;10 reps;Supine Long Arc Quad: AROM;Left;10 reps;Seated Knee Flexion: AROM;Left;Seated;10 reps Goniometric ROM: 0-100 L knee.   Bridges: AROM;Strengthening;Both;10 reps;Supine    General Comments        Pertinent Vitals/Pain Pain Assessment: 0-10 Pain Score: 8  Pain Location: lt knee Pain Descriptors / Indicators: Sore Pain Intervention(s): Monitored during session;Repositioned;Patient requesting pain meds-RN notified(informed nursing to bring tylenol.  )    Home Living  Prior Function            PT Goals (current goals can now be found in the care plan section) Acute Rehab PT Goals Patient Stated Goal: to go home Potential to Achieve Goals: Good Progress towards PT goals: Progressing toward goals    Frequency    7X/week      PT Plan Current plan remains appropriate    Co-evaluation              AM-PAC PT "6 Clicks" Daily Activity  Outcome Measure  Difficulty turning over in bed (including  adjusting bedclothes, sheets and blankets)?: None Difficulty moving from lying on back to sitting on the side of the bed? : None Difficulty sitting down on and standing up from a chair with arms (e.g., wheelchair, bedside commode, etc,.)?: A Little Help needed moving to and from a bed to chair (including a wheelchair)?: A Little Help needed walking in hospital room?: A Little Help needed climbing 3-5 steps with a railing? : A Little 6 Click Score: 20    End of Session Equipment Utilized During Treatment: Gait belt Activity Tolerance: Patient tolerated treatment well Patient left: with call bell/phone within reach;with family/visitor present;in bed Nurse Communication: Mobility status PT Visit Diagnosis: Unsteadiness on feet (R26.81);Muscle weakness (generalized) (M62.81)     Time: 5329-9242 PT Time Calculation (min) (ACUTE ONLY): 20 min  Charges:  $Gait Training: 8-22 mins                    G Codes:       Joycelyn Rua, PTA pager [phone]    Florestine Avers 05/05/2017, 12:24 PM

## 2017-05-05 NOTE — Progress Notes (Signed)
Subjective: 10 Days Post-Op Procedure(s) (LRB): TOTAL KNEE ARTHROPLASTY (Left)   Patient continues to do well and is looking forward to going home as soon as she safely can.  Activity level:  wbat Diet tolerance:  ok Voiding:  ok Patient reports pain as mild.    Objective: Vital signs in last 24 hours: Temp:  [97.2 F (36.2 C)-98.1 F (36.7 C)] 98.1 F (36.7 C) (01/25 0534) Pulse Rate:  [72-90] 72 (01/25 0534) Resp:  [16-18] 18 (01/25 0534) BP: (93-123)/(57-78) 93/65 (01/25 0534) SpO2:  [94 %-96 %] 94 % (01/25 0534) Weight:  [74.4 kg (164 lb 1.6 oz)] 74.4 kg (164 lb 1.6 oz) (01/25 0449)  Labs: No results for input(s): HGB in the last 72 hours. No results for input(s): WBC, RBC, HCT, PLT in the last 72 hours. Recent Labs    05/04/17 0434 05/05/17 0602  NA 138 139  K 3.4* 3.6  CL 95* 96*  CO2 31 30  BUN 20 19  CREATININE 0.84 0.82  GLUCOSE 89 93  CALCIUM 8.0* 8.5*   No results for input(s): LABPT, INR in the last 72 hours.  Physical Exam:  Neurologically intact ABD soft Neurovascular intact Sensation intact distally Intact pulses distally Dorsiflexion/Plantar flexion intact Incision: dressing C/D/I and no drainage No cellulitis present Compartment soft  Assessment/Plan:  10 Days Post-Op Procedure(s) (LRB): TOTAL KNEE ARTHROPLASTY (Left) Advance diet Up with therapy We greatly appreciate medical and cardiology management and assistance. Continue on home Eliquis for a-fib and DVT prevention. Tylenol for pain. We will leave discharge decisions regarding when and home vs SNF up tomedicine/cardiologyteam as her heart is the limiting factor at this time. Follow up in office next Wednesday for post op check up.   Marlyss Cissell, Ginger Organ 05/05/2017, 8:09 AM

## 2017-05-05 NOTE — Progress Notes (Signed)
Physical Therapy Treatment Patient Details Name: [name] MRN: [id number] DOB: 1930/10/31 Today's Date: 05/05/2017    History of Present Illness Pt is an 84 year old female with chronic atrial fibrillation s/p L TKA on 04/25/17.  Post op developed with RVR with her afib. PMH includes failed TEE CV 06/19/15, NICM with an EF of 50% (echo 2017), chronic diastolic heart failure, LBBB, CHF.      PT Comments    Pt performed short bout of gait then experienced irregular rhythm and returned to room and bed.  Informed nursing and deferred exercises. This pm, Cp applied to L knee and HEP issued.     Follow Up Recommendations  Home health PT;Supervision for mobility/OOB     Equipment Recommendations  None recommended by PT    Recommendations for Other Services       Precautions / Restrictions Precautions Precautions: Fall;Knee Precaution Comments: precautions/positioning reviewed Restrictions Weight Bearing Restrictions: Yes LLE Weight Bearing: Weight bearing as tolerated    Mobility  Bed Mobility Overal bed mobility: Modified Independent Bed Mobility: Supine to Sit;Sit to Supine     Supine to sit: Modified independent (Device/Increase time) Sit to supine: Modified independent (Device/Increase time)   General bed mobility comments: No assistance needed.    Transfers Overall transfer level: Needs assistance Equipment used: Rolling walker (2 wheeled) Transfers: Sit to/from Stand Sit to Stand: Supervision Stand pivot transfers: Min assist       General transfer comment: Pt presented with good hand placement and better carryover with eccentric loading.    Ambulation/Gait Ambulation/Gait assistance: Supervision Ambulation Distance (Feet): 120 Feet Assistive device: Rolling walker (2 wheeled) Gait Pattern/deviations: Step-through pattern;Decreased stride length;Antalgic;Decreased stance time - left Gait velocity: decreased Gait velocity interpretation: Below normal speed  for age/gender General Gait Details: Cues for gait symmetry with step through pattern.  Pt more painful and guarded, cues to relax shoulders.  Pt performed decreased gait and HR stable initially but then elevated to 134 with several beats of vtach per monitor.  Informed RN who reports artifact when analyzed closer.    Stairs Stairs: (pt declined stair training.  )          Wheelchair Mobility    Modified Rankin (Stroke Patients Only)       Balance                                            Cognition Arousal/Alertness: Awake/alert Behavior During Therapy: WFL for tasks assessed/performed Overall Cognitive Status: Within Functional Limits for tasks assessed                                 General Comments: Pt in less pain but more fatigued.       Exercises      General Comments        Pertinent Vitals/Pain Pain Assessment: 0-10 Pain Score: 5  Faces Pain Scale: Hurts little more Pain Location: lt knee Pain Descriptors / Indicators: Sore Pain Intervention(s): Monitored during session;Repositioned    Home Living                      Prior Function            PT Goals (current goals can now be found in the care plan section) Acute Rehab PT  Goals Patient Stated Goal: to go home Potential to Achieve Goals: Good Progress towards PT goals: Progressing toward goals    Frequency    7X/week      PT Plan Current plan remains appropriate    Co-evaluation              AM-PAC PT "6 Clicks" Daily Activity  Outcome Measure  Difficulty turning over in bed (including adjusting bedclothes, sheets and blankets)?: None Difficulty moving from lying on back to sitting on the side of the bed? : None Difficulty sitting down on and standing up from a chair with arms (e.g., wheelchair, bedside commode, etc,.)?: None Help needed moving to and from a bed to chair (including a wheelchair)?: A Little Help needed walking in  hospital room?: A Little Help needed climbing 3-5 steps with a railing? : A Little 6 Click Score: 21    End of Session Equipment Utilized During Treatment: Gait belt Activity Tolerance: Patient tolerated treatment well Patient left: with call bell/phone within reach;with family/visitor present;in bed Nurse Communication: Mobility status PT Visit Diagnosis: Unsteadiness on feet (R26.81);Muscle weakness (generalized) (M62.81)     Time: 1610-9604 PT Time Calculation (min) (ACUTE ONLY): 19 min  Charges:  $Gait Training: 8-22 mins                    G Codes:       Joycelyn Rua, PTA pager [phone]    Florestine Avers 05/05/2017, 4:43 PM

## 2017-05-05 NOTE — Care Management Important Message (Signed)
Important Message  Patient Details  Name: [name] MRN: [id number] Date of Birth: July 31, 1930   Medicare Important Message Given:  Yes    Lawerance Sabal, RN 05/05/2017, 11:25 AM

## 2017-05-05 NOTE — Progress Notes (Addendum)
Progress Note  Patient Name: [name] Date of Encounter: 05/05/2017  Primary Cardiologist: [name], MD  (patient request)  Subjective   [name] is doing well.  She ambulated without palpitations or shortness of breath.  Inpatient Medications    Scheduled Meds: . apixaban  2.5 mg Oral BID  . digoxin  0.25 mg Oral Daily  . docusate sodium  100 mg Oral BID  . famotidine  20 mg Oral Daily  . furosemide  40 mg Oral BID  . mouth rinse  15 mL Mouth Rinse BID  . metoprolol succinate  50 mg Oral Daily  . potassium chloride  40 mEq Oral BID  . sertraline  25 mg Oral QHS  . sertraline  50 mg Oral Daily   Continuous Infusions: . lactated ringers Stopped (04/26/17 0300)   PRN Meds: acetaminophen **OR** acetaminophen, alum & mag hydroxide-simeth, bisacodyl, bisacodyl, diphenhydrAMINE, HYDROmorphone (DILAUDID) injection, menthol-cetylpyridinium **OR** phenol, metoCLOPramide **OR** metoCLOPramide (REGLAN) injection, ondansetron **OR** ondansetron (ZOFRAN) IV, sodium phosphate   Vital Signs    Vitals:   05/04/17 1958 05/05/17 0449 05/05/17 0534 05/05/17 0832  BP: (!) 101/57  93/65 110/70  Pulse: 86  72 (!) 103  Resp: 18  18   Temp: 98 F (36.7 C)  98.1 F (36.7 C)   TempSrc: Oral  Oral   SpO2: 95%  94% 98%  Weight:  164 lb 1.6 oz (74.4 kg)    Height:        Intake/Output Summary (Last 24 hours) at 05/05/2017 0927 Last data filed at 05/05/2017 0917 Gross per 24 hour  Intake 600 ml  Output 2070 ml  Net -1470 ml   Filed Weights   05/03/17 0500 05/04/17 0633 05/05/17 0449  Weight: 181 lb 10.5 oz (82.4 kg) 180 lb 1.9 oz (81.7 kg) 164 lb 1.6 oz (74.4 kg)    Telemetry    Atrial fibrillation. Rate mostly 80s.  All <100 bpm  - Personally Reviewed  ECG    n/a - Personally Reviewed  Physical Exam   VS:  BP 110/70 (BP Location: Left Arm)   Pulse (!) 103   Temp 98.1 F (36.7 C) (Oral)   Resp 18   Ht 5\' 5"  (1.651 m)   Wt 164 lb 1.6 oz (74.4 kg)   SpO2  98%   BMI 27.31 kg/m  , BMI Body mass index is 27.31 kg/m. GENERAL:  Well appearing.  No acute distress. HEENT: Pupils equal round and reactive, fundi not visualized, oral mucosa unremarkable NECK:  No jugular venous distention, waveform within normal limits, carotid upstroke brisk and symmetric, no bruits LUNGS: Clear to auscultation bilaterally.  No crackles, wheezes, or rhonchi HEART:  Irregularly irregular.   PMI not displaced or sustained,S1 and S2 within normal limits, no S3, no S4, no clicks, no rubs, no murmurs ABD:  Flat, positive bowel sounds normal in frequency in pitch, no bruits, no rebound, no guarding, no midline pulsatile mass, no hepatomegaly, no splenomegaly EXT:  2 plus pulses throughout, trace edema, no cyanosis no clubbing SKIN:  No rashes no nodules NEURO:  Cranial nerves II through XII grossly intact, motor grossly intact throughout PSYCH:  Cognitively intact, oriented to person place and time   Labs    Chemistry Recent Labs  Lab 04/30/17 0357 05/01/17 1230  05/03/17 0535 05/04/17 0434 05/05/17 0602  NA 128* 128*   < > 136 138 139  K 4.5 4.0   < > 3.3* 3.4* 3.6  CL 92* 90*   < >  95* 95* 96*  CO2 21* 22   < > 28 31 30   GLUCOSE 121* 125*   < > 81 89 93  BUN 50* 50*   < > 31* 20 19  CREATININE 1.43* 1.38*   < > 1.03* 0.84 0.82  CALCIUM 8.7* 8.6*   < > 8.1* 8.0* 8.5*  PROT  --  5.6*  --   --   --   --   ALBUMIN 3.0* 3.1*  --   --   --   --   AST  --  608*  --   --   --   --   ALT  --  603*  --   --   --   --   ALKPHOS  --  147*  --   --   --   --   BILITOT  --  3.0*  --   --   --   --   GFRNONAA 33* 34*   < > 49* >60 >60  GFRAA 38* 39*   < > 56* >60 >60  ANIONGAP 15 16*   < > 13 12 13    < > = values in this interval not displayed.     Hematology No results for input(s): WBC, RBC, HGB, HCT, MCV, MCH, MCHC, RDW, PLT in the last 168 hours.  Cardiac EnzymesNo results for input(s): TROPONINI in the last 168 hours. No results for input(s): TROPIPOC in the  last 168 hours.   BNP Recent Labs  Lab 05/01/17 1230  BNP 4,284.7*     DDimer No results for input(s): DDIMER in the last 168 hours.   Radiology    Nm Myocar Multi W/spect W/wall Motion / Ef  Result Date: 05/04/2017 CLINICAL DATA:  Heart failure, rapid atrial fibrillation and cardiomyopathy. EXAM: MYOCARDIAL IMAGING WITH SPECT (REST AND PHARMACOLOGIC-STRESS) GATED LEFT VENTRICULAR WALL MOTION STUDY LEFT VENTRICULAR EJECTION FRACTION TECHNIQUE: Standard myocardial SPECT imaging was performed after resting intravenous injection of 10 mCi Tc-29m tetrofosmin. Subsequently, intravenous infusion of Lexiscan was performed under the supervision of the Cardiology staff. At peak effect of the drug, 30 mCi Tc-45m tetrofosmin was injected intravenously and standard myocardial SPECT imaging was performed. Quantitative gated imaging was also performed to evaluate left ventricular wall motion, and estimate left ventricular ejection fraction. COMPARISON:  None. FINDINGS: Perfusion: There is suggestion potential subtle apical, distal septal and distal anteroseptal ischemia. There does appear to be a component of mild diminished perfusion of the distal anterior wall on both rest and stress studies. This may partly be due to overlying breast soft tissue. Wall Motion: The septum is dyskinetic. The left ventricular apex is nearly akinetic. The rest of the left ventricle shows severe global hypokinesis with significant left ventricular cavity dilatation. Left Ventricular Ejection Fraction: 23%. End diastolic volume: 161 ml End systolic volume: 096 ml IMPRESSION: 1. Potential subtle ischemia involving the left ventricular apex, distal septum and distal anteroseptal wall. Mildly diminished perfusion on both stress and rest studies of the distal anterior wall may be related to scar or breast soft tissue attenuation. 2. Severe wall motion abnormalities with septal dyskinesis, apical akinesis and otherwise global hypokinesis.  The left ventricular cavity is significantly dilated. 3. Left ventricular ejection fraction: 23%. 4. Non invasive risk stratification*: High *2012 Appropriate Use Criteria for Coronary Revascularization Focused Update: J Am Coll Cardiol. 2012;59(9):857-881. http://content.dementiazones.com.aspx?[email or web address] Electronically Signed   By: Irish Lack M.D.   On: 05/04/2017 14:46    Cardiac Studies   Echo 04/30/17:  Study Conclusions  - Left ventricle: The cavity size was moderately dilated. Wall   thickness was normal. Systolic function was severely reduced. The   estimated ejection fraction was 15%. No mural thrombus with   Definity contrast. Diffuse hypokinesis. The study is not   technically sufficient to allow evaluation of LV diastolic   function. - Aortic valve: Mildly calcified leaflets. There was no stenosis.   There was no regurgitation. - Mitral valve: Mildly thickened leaflets . There was moderate   regurgitation. - Left atrium: Severely dilated. - Right ventricle: The cavity size was mildly dilated. Mildly   reduced systolic function. - Right atrium: Severely dilated. - Tricuspid valve: There was moderate to severe regurgitation. - Pulmonary arteries: PA peak pressure: 45 mm Hg (S). - Inferior vena cava: The vessel was dilated. The respirophasic   diameter changes were blunted (< 50%), consistent with elevated   central venous pressure. - Pericardium, extracardiac: There was a left pleural effusion.  Impressions:  - Compared to a prior study in 06/2015, the LVEF has markedly   reduced from 50% to 15% with global hypokinesis.  Lexiscan Myoview 05/04/17: IMPRESSION: 1. Potential subtle ischemia involving the left ventricular apex, distal septum and distal anteroseptal wall. Mildly diminished perfusion on both stress and rest studies of the distal anterior wall may be related to scar or breast soft tissue attenuation.  2. Severe wall motion abnormalities  with septal dyskinesis, apical akinesis and otherwise global hypokinesis. The left ventricular cavity is significantly dilated.  3. Left ventricular ejection fraction: 23%.  4. Non invasive risk stratification*: High   Patient Profile     84 y.o. female with chronic diastolic heart failure here for L knee replacement when she developed atrial fibrillation with RVR.   Assessment & Plan    # Acute systolic and diastolic heart failure: LVEF reduced to 15% from 50% in 2017. She is improving with diuresis.  She is feeling much better and ambulating without shortness of breath.  She has no orthopnea or PND.  Renal function is stable with diuresis.  I think she is near euvolemic.  We will switch Lasix to 40 mg p.o. twice daily.  If she maintains a negative fluid balance she should be able to go home tomorrow.   Start losartan 12.5 mg daily.  If her blood pressure tolerates that she would be a good candidate for Entresto.  Lexiscan Myoview this admission revealed LVEF 23%.  On my independent review her stress test showed breast attenuation artifact +/- LBBB and no ischemia.  Repeat echo in 3 months.  # Moderate mitral regurgitation: # Severe tricuspid regurgitation:  Diuresis as above.    # Atrial fibrillation with RVR:  Ms. Bartley is on low dose Eliquis due to age and prior retroperitoneal bleed on heparin.  Her heart rate was poorly controlled on 1/21 and we tried amiodarone.  However she developed nausea and hypotension so this was discontinued.  She was started on digoxin and metoprolol was reduced due to fatigue and hypotension.  Her heart rate is much better controlled today.  Continue digoxin.  Digoxin level was within range.    For questions or updates, please contact [facility] Please consult [email or web address] for contact info under Cardiology/STEMI.      Signed, [name], MD  05/05/2017, 9:27 AM

## 2017-05-06 ENCOUNTER — Other Ambulatory Visit: Payer: Self-pay | Admitting: Medical

## 2017-05-06 DIAGNOSIS — Z96652 Presence of left artificial knee joint: Secondary | ICD-10-CM

## 2017-05-06 DIAGNOSIS — E876 Hypokalemia: Secondary | ICD-10-CM

## 2017-05-06 LAB — BASIC METABOLIC PANEL
Anion gap: 10 (ref 5–15)
BUN: 17 mg/dL (ref 6–20)
CO2: 30 mmol/L (ref 22–32)
Calcium: 8.9 mg/dL (ref 8.9–10.3)
Chloride: 94 mmol/L — ABNORMAL LOW (ref 101–111)
Creatinine, Ser: 0.9 mg/dL (ref 0.44–1.00)
GFR calc Af Amer: >60 mL/min (ref >60)
GFR calc non Af Amer: 57 mL/min — ABNORMAL LOW (ref >60)
Glucose, Bld: 91 mg/dL (ref 65–99)
Potassium: 4.8 mmol/L (ref 3.5–5.1)
Sodium: 134 mmol/L — ABNORMAL LOW (ref 135–145)

## 2017-05-06 MED ORDER — METOPROLOL SUCCINATE ER 50 MG PO TB24: 50.0000 mg | ORAL_TABLET | Freq: Every day | ORAL | 3 refills | Status: DC

## 2017-05-06 MED ORDER — SERTRALINE HCL 50 MG PO TABS: 50.0000 mg | ORAL_TABLET | Freq: Every day | ORAL | Status: DC

## 2017-05-06 MED ORDER — DOCUSATE SODIUM 100 MG PO CAPS: 100.0000 mg | ORAL_CAPSULE | Freq: Two times a day (BID) | ORAL | 0 refills | Status: DC

## 2017-05-06 MED ORDER — POTASSIUM CHLORIDE CRYS ER 20 MEQ PO TBCR: 20.0000 meq | EXTENDED_RELEASE_TABLET | Freq: Two times a day (BID) | ORAL | 2 refills | Status: DC

## 2017-05-06 MED ORDER — DIGOXIN 250 MCG PO TABS: 0.2500 mg | ORAL_TABLET | Freq: Every day | ORAL | 2 refills | Status: DC

## 2017-05-06 MED ORDER — LOSARTAN POTASSIUM 25 MG PO TABS: 12.5000 mg | ORAL_TABLET | Freq: Every day | ORAL | 3 refills | Status: DC

## 2017-05-06 MED ORDER — FUROSEMIDE 40 MG PO TABS: 40.0000 mg | ORAL_TABLET | Freq: Two times a day (BID) | ORAL | 3 refills | Status: DC

## 2017-05-06 NOTE — Progress Notes (Signed)
Physical Therapy Treatment Patient Details Name: [name] MRN: [id number] DOB: 1930-12-17 Today's Date: 05/06/2017    History of Present Illness Pt is an 84 year old female with chronic atrial fibrillation s/p L TKA on 04/25/17.  Post op developed with RVR with her afib. PMH includes failed TEE CV 06/19/15, NICM with an EF of 50% (echo 2017), chronic diastolic heart failure, LBBB, CHF.      PT Comments    Pt was seen for gait and exercises with short session and notable maintenance of HR in safe range from 72 at rest to 92 with gait.  Pt is expecting to leave this AM and will be seen in afternoon for therapy if still here and pt is willing to work.  Follow her for gait and balance, safety of transfers and there ex to L TKA.   Follow Up Recommendations  Home health PT;Supervision for mobility/OOB     Equipment Recommendations  None recommended by PT    Recommendations for Other Services       Precautions / Restrictions Precautions Precautions: Fall;Knee Precaution Booklet Issued: Yes (comment) Restrictions Weight Bearing Restrictions: Yes LLE Weight Bearing: Weight bearing as tolerated    Mobility  Bed Mobility               General bed mobility comments: up when PT arrived  Transfers Overall transfer level: Needs assistance Equipment used: Rolling walker (2 wheeled) Transfers: Sit to/from Stand Sit to Stand: Supervision         General transfer comment: reminders for reaching back to control descent which pt did not do either in BR or back to her chair  Ambulation/Gait Ambulation/Gait assistance: Supervision Ambulation Distance (Feet): 30 Feet Assistive device: Rolling walker (2 wheeled) Gait Pattern/deviations: Step-through pattern;Decreased stride length;Decreased weight shift to left;Narrow base of support Gait velocity: decreased Gait velocity interpretation: Below normal speed for age/gender General Gait Details: reminders for directing safely and  transition to sit   Stairs            Wheelchair Mobility    Modified Rankin (Stroke Patients Only)       Balance     Sitting balance-Leahy Scale: Good     Standing balance support: Bilateral upper extremity supported;During functional activity Standing balance-Leahy Scale: Fair Standing balance comment: less dependent on walker to control standing                            Cognition Arousal/Alertness: Awake/alert Behavior During Therapy: WFL for tasks assessed/performed Overall Cognitive Status: Within Functional Limits for tasks assessed                                        Exercises Total Joint Exercises Short Arc Quad: AAROM;Left;10 reps Heel Slides: Strengthening;Left;10 reps Long Arc Quad: Strengthening;Left;10 reps    General Comments        Pertinent Vitals/Pain Pain Assessment: Faces Faces Pain Scale: Hurts little more Pain Location: L knee Pain Descriptors / Indicators: Operative site guarding Pain Intervention(s): Monitored during session;Premedicated before session;Repositioned    Home Living                      Prior Function            PT Goals (current goals can now be found in the care plan section) Acute Rehab  PT Goals Patient Stated Goal: to go home Progress towards PT goals: Progressing toward goals    Frequency    7X/week      PT Plan Current plan remains appropriate    Co-evaluation              AM-PAC PT "6 Clicks" Daily Activity  Outcome Measure  Difficulty turning over in bed (including adjusting bedclothes, sheets and blankets)?: None Difficulty moving from lying on back to sitting on the side of the bed? : None Difficulty sitting down on and standing up from a chair with arms (e.g., wheelchair, bedside commode, etc,.)?: A Little Help needed moving to and from a bed to chair (including a wheelchair)?: A Little Help needed walking in hospital room?: A Little Help  needed climbing 3-5 steps with a railing? : A Little 6 Click Score: 20    End of Session Equipment Utilized During Treatment: Gait belt Activity Tolerance: Patient tolerated treatment well Patient left: in chair;with call bell/phone within reach;with family/visitor present Nurse Communication: Mobility status PT Visit Diagnosis: Unsteadiness on feet (R26.81);Muscle weakness (generalized) (M62.81)     Time: 1610-9604 PT Time Calculation (min) (ACUTE ONLY): 20 min  Charges:  $Therapeutic Exercise: 8-22 mins                    G Codes:  Functional Assessment Tool Used: AM-PAC 6 Clicks Basic Mobility     Ivar Drape 05/06/2017, 9:45 AM   Samul Dada, PT MS Acute Rehab Dept. Number: [facility] [id number] and [facility] [phone]

## 2017-05-06 NOTE — Progress Notes (Signed)
Progress Note  Patient Name: [name] Date of Encounter: 05/06/2017  Primary Cardiologist: [name], MD   Subjective   No SOB, no palpitations.   Inpatient Medications    Scheduled Meds: . apixaban  2.5 mg Oral BID  . digoxin  0.25 mg Oral Daily  . docusate sodium  100 mg Oral BID  . famotidine  20 mg Oral Daily  . furosemide  40 mg Oral BID  . losartan  12.5 mg Oral Daily  . mouth rinse  15 mL Mouth Rinse BID  . metoprolol succinate  50 mg Oral Daily  . potassium chloride  40 mEq Oral BID  . sertraline  25 mg Oral QHS  . sertraline  50 mg Oral Daily   Continuous Infusions: . lactated ringers Stopped (04/26/17 0300)   PRN Meds: acetaminophen **OR** acetaminophen, alum & mag hydroxide-simeth, bisacodyl, bisacodyl, diphenhydrAMINE, HYDROmorphone (DILAUDID) injection, menthol-cetylpyridinium **OR** phenol, metoCLOPramide **OR** metoCLOPramide (REGLAN) injection, ondansetron **OR** ondansetron (ZOFRAN) IV, sodium phosphate   Vital Signs    Vitals:   05/05/17 1122 05/05/17 2108 05/06/17 0143 05/06/17 0425  BP: 112/61 108/60  (!) 118/59  Pulse: 72 80  73  Resp: 18 18  18   Temp: 98 F (36.7 C) 99.7 F (37.6 C)  97.7 F (36.5 C)  TempSrc: Oral Oral  Oral  SpO2: 98% 97%  95%  Weight:   165 lb 8 oz (75.1 kg)   Height:        Intake/Output Summary (Last 24 hours) at 05/06/2017 0731 Last data filed at 05/06/2017 0144 Gross per 24 hour  Intake 840 ml  Output 2230 ml  Net -1390 ml   Filed Weights   05/04/17 0633 05/05/17 0449 05/06/17 0143  Weight: 180 lb 1.9 oz (81.7 kg) 164 lb 1.6 oz (74.4 kg) 165 lb 8 oz (75.1 kg)    Telemetry    Rate controlled afib - Personally Reviewed  ECG    n/a  Physical Exam   GEN: No acute distress.   Neck: No JVD Cardiac:irreg, 3/6 systolic murmur at apex Respiratory: Clear to auscultation bilaterally. GI: Soft, nontender, non-distended  MS: No edema; No deformity. Neuro:  Nonfocal  Psych: Normal affect    Labs    Chemistry Recent Labs  Lab 04/30/17 0357 05/01/17 1230  05/03/17 0535 05/04/17 0434 05/05/17 0602  NA 128* 128*   < > 136 138 139  K 4.5 4.0   < > 3.3* 3.4* 3.6  CL 92* 90*   < > 95* 95* 96*  CO2 21* 22   < > 28 31 30   GLUCOSE 121* 125*   < > 81 89 93  BUN 50* 50*   < > 31* 20 19  CREATININE 1.43* 1.38*   < > 1.03* 0.84 0.82  CALCIUM 8.7* 8.6*   < > 8.1* 8.0* 8.5*  PROT  --  5.6*  --   --   --   --   ALBUMIN 3.0* 3.1*  --   --   --   --   AST  --  608*  --   --   --   --   ALT  --  603*  --   --   --   --   ALKPHOS  --  147*  --   --   --   --   BILITOT  --  3.0*  --   --   --   --   GFRNONAA 33* 34*   < >  49* >60 >60  GFRAA 38* 39*   < > 56* >60 >60  ANIONGAP 15 16*   < > 13 12 13    < > = values in this interval not displayed.     HematologyNo results for input(s): WBC, RBC, HGB, HCT, MCV, MCH, MCHC, RDW, PLT in the last 168 hours.  Cardiac EnzymesNo results for input(s): TROPONINI in the last 168 hours. No results for input(s): TROPIPOC in the last 168 hours.   BNP Recent Labs  Lab 05/01/17 1230  BNP 4,284.7*     DDimer No results for input(s): DDIMER in the last 168 hours.   Radiology    Nm Myocar Multi W/spect W/wall Motion / Ef  Result Date: 05/04/2017 CLINICAL DATA:  Heart failure, rapid atrial fibrillation and cardiomyopathy. EXAM: MYOCARDIAL IMAGING WITH SPECT (REST AND PHARMACOLOGIC-STRESS) GATED LEFT VENTRICULAR WALL MOTION STUDY LEFT VENTRICULAR EJECTION FRACTION TECHNIQUE: Standard myocardial SPECT imaging was performed after resting intravenous injection of 10 mCi Tc-67m tetrofosmin. Subsequently, intravenous infusion of Lexiscan was performed under the supervision of the Cardiology staff. At peak effect of the drug, 30 mCi Tc-39m tetrofosmin was injected intravenously and standard myocardial SPECT imaging was performed. Quantitative gated imaging was also performed to evaluate left ventricular wall motion, and estimate left ventricular ejection  fraction. COMPARISON:  None. FINDINGS: Perfusion: There is suggestion potential subtle apical, distal septal and distal anteroseptal ischemia. There does appear to be a component of mild diminished perfusion of the distal anterior wall on both rest and stress studies. This may partly be due to overlying breast soft tissue. Wall Motion: The septum is dyskinetic. The left ventricular apex is nearly akinetic. The rest of the left ventricle shows severe global hypokinesis with significant left ventricular cavity dilatation. Left Ventricular Ejection Fraction: 23%. End diastolic volume: 161 ml End systolic volume: 096 ml IMPRESSION: 1. Potential subtle ischemia involving the left ventricular apex, distal septum and distal anteroseptal wall. Mildly diminished perfusion on both stress and rest studies of the distal anterior wall may be related to scar or breast soft tissue attenuation. 2. Severe wall motion abnormalities with septal dyskinesis, apical akinesis and otherwise global hypokinesis. The left ventricular cavity is significantly dilated. 3. Left ventricular ejection fraction: 23%. 4. Non invasive risk stratification*: High *2012 Appropriate Use Criteria for Coronary Revascularization Focused Update: J Am Coll Cardiol. 2012;59(9):857-881. http://content.[email or web address].aspx?articleid=[email or web address] Electronically Signed   By: Irish Lack M.D.   On: 05/04/2017 14:46    Cardiac Studies     Patient Profile       84 y.o. female with chronic diastolic heart failure here for L knee replacement when she developed atrial fibrillation with RVR.     Assessment & Plan    1. Acute systolic and diastolic HF - echo with acute drop to 15% this admission.  - changed to oral lasix yesterday - nuclear stress test without clear ischemia, suspected NICM - plan to repeat echo 3 months on medical therapy - current medical therapy with digoxin 0.59m losartan 12.5mg , Toprl 50mg  daily. Titrate as outpatient.  Consider entresto as outpatient  2. Valvular heart disease - moderate MR and severe TR, follow with management of her systolic dysfunction  3. Afib with RVR - on eliquis for stroke prevention - rate control with toprol, digoxin - low dose Eliquis due to age and prior retroperitoneal bleed on heparin - nausea on amio, discontinued   Ok for discharge, needs f/u 1-2 weeks. Dr Duke Salvia to be her primary cardiologist.   For questions or updates,  please contact [facility] Please consult [email or web address] for contact info under Cardiology/STEMI.      Joanie Coddington, MD  05/06/2017, 7:31 AM

## 2017-05-06 NOTE — Progress Notes (Signed)
Pt got discharged to home, discharge instructions provided and patient showed understanding to it, IV taken out,Telemonitor DC,pt left unit in wheelchair with all of the belongings accompanied with a family member (Husband and Daughter)  Lonia Farber, Charity fundraiser

## 2017-05-06 NOTE — Discharge Summary (Signed)
Discharge Summary    Patient ID: [name],  MRN: [id number], DOB/AGE: October 14, 1930 84 y.o.  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 Ace Inhibitors Cough  . Warfarin Sodium Other (See Comments)    DOSE RELATED PHARMACOLOGIC EFFECT "bleed out"  . Codeine Other (See Comments)    sedation  . Delsym [Dextromethorphan Polistirex Er] Other (See Comments)    dizziness  . Lactose Intolerance (Gi) Diarrhea  . Phenylephrine Palpitations and Other (See Comments)    Nasal spray- "likely increase in nasal congestion".   . Sulfamethoxazole-Trimethoprim Nausea And Vomiting    GI intolerance.  . Tramadol Other (See Comments)    Hallucinations, while on zoloft concurrently.      Diagnostic Studies/Procedures    Total Left Knee Arthroplasty 04/25/17: s/p L knee replacement. Patient tolerated the procedure without complications.   V/Q Scan 04/28/17: IMPRESSION: Low probability pulmonary embolus. Multiple bilateral ventilatory defects may be related to CHF noted on today's chest x-ray.  ECHO 04/30/17: Study Conclusions  - Left ventricle: The cavity size was moderately dilated. Wall   thickness was normal. Systolic function was severely reduced. The   estimated ejection fraction was 15%. No mural thrombus with   Definity contrast. Diffuse hypokinesis. The study is not   technically sufficient to allow evaluation of LV diastolic   function. - Aortic valve: Mildly calcified  leaflets. There was no stenosis.   There was no regurgitation. - Mitral valve: Mildly thickened leaflets . There was moderate   regurgitation. - Left atrium: Severely dilated. - Right ventricle: The cavity size was mildly dilated. Mildly   reduced systolic function. - Right atrium: Severely dilated. - Tricuspid valve: There was moderate to severe regurgitation. - Pulmonary arteries: PA peak pressure: 45 mm Hg (S). - Inferior vena cava: The vessel was dilated. The respirophasic   diameter changes were blunted (< 50%), consistent with elevated   central venous pressure. - Pericardium, extracardiac: There was a left pleural effusion.  Impressions:  - Compared to a prior study in 06/2015, the LVEF has markedly   reduced from 50% to 15% with global hypokinesis.  NST 05/04/17: IMPRESSION: 1. Potential subtle ischemia involving the left ventricular apex, distal septum and distal anteroseptal wall. Mildly diminished perfusion on both stress and rest studies of the distal anterior wall may be related to scar or breast soft tissue attenuation.  2. Severe wall motion abnormalities with septal dyskinesis, apical akinesis and otherwise global hypokinesis. The left ventricular cavity is significantly dilated.  3. Left ventricular ejection fraction: 23%.  4. Non invasive risk stratification*: High  _____________   History of Present Illness     [name], 84 y.o. female with a hx of chronic atrial fibrillation on low dose Eliquis (with failed TEE CV 06/19/15), NICM with an EF of 50% per echo in 2017, chronic diastolic heart failure, LBBB, and prior spontaneous retroperitoneal bleed on Coumadin. She presented 04/25/17 for L knee replacement. She has a history of pain and functional disability  in the left knee due to arthritis and has failed non-surgical conservative treatments for greater than 12 weeks to include NSAID's and/or analgesics, corticosteriod injections,  viscosupplementation injections, flexibility and strengthening excercises, use of assistive devices, weight reduction as appropriate and activity modification.  Onset of symptoms was gradual, starting 5 years ago with gradually worsening course since that time. The patient noted no past surgery on the left knee(s).  Patient currently rates pain in the left knee(s) at 10 out of 10 with activity. Patient has night pain, worsening of pain with activity and weight bearing, pain that interferes with activities of daily living, crepitus and joint swelling.  Patient has evidence of subchondral cysts, subchondral sclerosis, periarticular osteophytes and joint space narrowing by imaging studies. There is no active infection. Initially admitted to orthopedics for management of L knee osteoarthritis. Post-op course complicated by Afib RVR and acute on chronic combined heart failure   Hospital Course     Consultants: Cardiology, ultimately transitioned to primary team 05/03/17  1. L knee osteoarthritis: s/p L knee total arthroplasty 04/25/17 - Patient tolerated the procedure well without complications - Received post-op abx - Pain controlled with tylenol - Recommended for SNF for ongoing rehabilitation, however patient requested home with home PT services - Recommended to continue eliquis for DVT prevention - To follow-up outpatient with Dr. Jerl Santos  2. Acute on chronic combined heart failure:  - BNP >4000 - CXR with pulmonary vascular congestion - ECHO with markedly reduced LVEF from 50% (06/2015) to 15% with global hypokinesis. - NST revealed LVEF 23% with breast attenuation artifact +/- LBBB and no ischemia. - VQ Scan obtained to evaluate hypoxia with low probability PE. Multiple b/l ventilatory defects may be related to CHF noted on CXR   - Patient was aggressively diuresed with IV lasix BID>TID and transitioned to po lasix 40mg  BID prior to discharge - UOP net -1.1L this admission - Wt at discharge:  165lbs - Discharged on lasix 40mg  BID, metoprolol XL 50mg  daily, and losartan 12.5mg  daily - Will need follow-up echocardiogram in 3 months.   3. Atrial fibrillation: patient with episode of RVR following TKA 04/25/17 while in PACU. S/p unsuccessful TEE CV (06/19/15) c/b right rectus sheath hematoma while on Heparin which required hospitalization. - Initially managed with diltiazem gtt and transitioned to po metoprolol (titrated as needed) - Patient continued to have poorly controlled HR's. Trial of amiodarone IV unsuccessful due to hypotension and nausea. Ultimately started on digoxin with adequate HR control.  - Digoxin level wnl - Discharged on metoprolol XL 50mg  daily, digoxin 0.25mg  daily - Continued home eliquis 2.5mg  BID   4. Moderate MR, Severe TR:  - Continue po lasix 40mg  BID at discharge   _____________  Discharge Vitals Blood pressure (!) 118/59, pulse 73, temperature 97.7 F (36.5 C), temperature source Oral, resp. rate 18, height 5\' 5"  (1.651 m), weight 165 lb 8 oz (75.1 kg), SpO2 95 %.  Filed Weights   05/04/17 0633 05/05/17 0449 05/06/17 0143  Weight: 180 lb 1.9 oz (81.7 kg) 164 lb 1.6 oz (74.4 kg) 165 lb 8 oz (75.1 kg)    Labs & Radiologic Studies    CBC No results for input(s): WBC, NEUTROABS, HGB, HCT, MCV, PLT in the last 72 hours. Basic Metabolic Panel Recent Labs    16/10/96 0602 05/06/17 0650  NA 139 134*  K 3.6 4.8  CL 96* 94*  CO2 30 30  GLUCOSE 93 91  BUN 19 17  CREATININE 0.82 0.90  CALCIUM  8.5* 8.9   Liver Function Tests No results for input(s): AST, ALT, ALKPHOS, BILITOT, PROT, ALBUMIN in the last 72 hours. No results for input(s): LIPASE, AMYLASE in the last 72 hours. Cardiac Enzymes No results for input(s): CKTOTAL, CKMB, CKMBINDEX, TROPONINI in the last 72 hours. BNP Invalid input(s): POCBNP D-Dimer No results for input(s): DDIMER in the last 72 hours. Hemoglobin A1C No results for input(s): HGBA1C in the last 72 hours. Fasting Lipid  Panel No results for input(s): CHOL, HDL, LDLCALC, TRIG, CHOLHDL, LDLDIRECT in the last 72 hours. Thyroid Function Tests No results for input(s): TSH, T4TOTAL, T3FREE, THYROIDAB in the last 72 hours.  Invalid input(s): FREET3 _____________  Dg Chest 2 View  Result Date: 04/28/2017 CLINICAL DATA:  Congestive heart failure EXAM: CHEST  2 VIEW COMPARISON:  April 27, 2017 FINDINGS: There is cardiomegaly with mild pulmonary venous hypertension. There is trace interstitial pulmonary edema. There is a small left pleural effusion with left base atelectasis. There is no consolidation. There is aortic atherosclerosis. No adenopathy. No bone lesions. IMPRESSION: Pulmonary vascular congestion with small left pleural effusion and trace interstitial edema. Left base atelectasis. No consolidation. Aortic atherosclerosis. Aortic Atherosclerosis (ICD10-I70.0). Electronically Signed   By: Bretta Bang III M.D.   On: 04/28/2017 10:11   Dg Chest 2 View  Result Date: 04/19/2017 CLINICAL DATA:  Preoperative evaluation for knee replacement surgery, history of atrial fibrillation, CHF, GERD EXAM: CHEST  2 VIEW COMPARISON:  06/18/2015 FINDINGS: Enlargement of cardiac silhouette with slight vascular congestion. Atherosclerotic calcifications aorta. Lungs mildly hyperinflated with chronic bronchitic and interstitial changes grossly stable since prior exam. No acute infiltrate, pleural effusion or pneumothorax. Bones demineralized. IMPRESSION: Hyperinflated lungs with chronic bronchitic and interstitial changes question fibrosis. Enlargement of cardiac silhouette with slight pulmonary vascular congestion. No definite acute abnormalities. Electronically Signed   By: Ulyses Southward M.D.   On: 04/19/2017 14:04   Nm Myocar Multi W/spect W/wall Motion / Ef  Result Date: 05/04/2017 CLINICAL DATA:  Heart failure, rapid atrial fibrillation and cardiomyopathy. EXAM: MYOCARDIAL IMAGING WITH SPECT (REST AND PHARMACOLOGIC-STRESS) GATED  LEFT VENTRICULAR WALL MOTION STUDY LEFT VENTRICULAR EJECTION FRACTION TECHNIQUE: Standard myocardial SPECT imaging was performed after resting intravenous injection of 10 mCi Tc-65m tetrofosmin. Subsequently, intravenous infusion of Lexiscan was performed under the supervision of the Cardiology staff. At peak effect of the drug, 30 mCi Tc-21m tetrofosmin was injected intravenously and standard myocardial SPECT imaging was performed. Quantitative gated imaging was also performed to evaluate left ventricular wall motion, and estimate left ventricular ejection fraction. COMPARISON:  None. FINDINGS: Perfusion: There is suggestion potential subtle apical, distal septal and distal anteroseptal ischemia. There does appear to be a component of mild diminished perfusion of the distal anterior wall on both rest and stress studies. This may partly be due to overlying breast soft tissue. Wall Motion: The septum is dyskinetic. The left ventricular apex is nearly akinetic. The rest of the left ventricle shows severe global hypokinesis with significant left ventricular cavity dilatation. Left Ventricular Ejection Fraction: 23%. End diastolic volume: 161 ml End systolic volume: 096 ml IMPRESSION: 1. Potential subtle ischemia involving the left ventricular apex, distal septum and distal anteroseptal wall. Mildly diminished perfusion on both stress and rest studies of the distal anterior wall may be related to scar or breast soft tissue attenuation. 2. Severe wall motion abnormalities with septal dyskinesis, apical akinesis and otherwise global hypokinesis. The left ventricular cavity is significantly dilated. 3. Left ventricular ejection fraction: 23%. 4. Non invasive risk stratification*: High *  2012 Appropriate Use Criteria for Coronary Revascularization Focused Update: J Am Coll Cardiol. 2012;59(9):857-881. http://content.dementiazones.com.aspx?articleid=[email or web address] Electronically Signed   By: Irish Lack M.D.   On:  05/04/2017 14:46   Nm Pulmonary Perf And Vent  Result Date: 04/28/2017 CLINICAL DATA:  Shortness of breath. History of CHF and atrial fibrillation. EXAM: NUCLEAR MEDICINE VENTILATION - PERFUSION LUNG SCAN TECHNIQUE: Ventilation images were obtained in multiple projections using inhaled aerosol Tc-69m DTPA. Perfusion images were obtained in multiple projections after intravenous injection of Tc-15m MAA. RADIOPHARMACEUTICALS:  31.4 mCi Technetium-76m DTPA aerosol inhalation and 4.0 mCi Technetium-14m MAA IV COMPARISON:  Chest x-ray 04/28/2017. FINDINGS: Multiple bilateral prominent ventilatory defects. No significant perfusion defects. Low probability scan for pulmonary embolus. IMPRESSION: Low probability pulmonary embolus. Multiple bilateral ventilatory defects may be related to CHF noted on today's chest x-ray. Electronically Signed   By: Maisie Fus  Register   On: 04/28/2017 14:46   Dg Chest Port 1 View  Result Date: 04/27/2017 CLINICAL DATA:  Shortness of breath.  Recent knee surgery. EXAM: PORTABLE CHEST 1 VIEW COMPARISON:  Chest x-ray dated April 19, 2017. FINDINGS: Stable cardiomegaly. Increased interstitial markings at the lung bases. New dense retrocardiac consolidation and small left pleural effusion. Probable small right pleural effusion. No pneumothorax. No acute osseous abnormality. IMPRESSION: 1. Stable cardiomegaly with pulmonary interstitial edema and small left greater than right pleural effusions. 2. New dense retrocardiac consolidation, which could reflect atelectasis due to adjacent effusion, versus pneumonia. Consider dedicated PA and lateral chest x-rays for further evaluation. Electronically Signed   By: Obie Dredge M.D.   On: 04/27/2017 19:47   Disposition   Patient was seen and examined by Dr. Wyline Mood who deemed patient as stable for discharge. Follow-up has been arranged. Discharge medications as listed below.   Follow-up Plans & Appointments    Follow-up Information     [name], MD. Schedule an appointment as soon as possible for a visit in 2 week(s).   Specialty:  Orthopedic Surgery Contact information: [address] [place] [address] [phone]        [facility] Follow up.   Specialty:  Home Health Services Why:  HHPT Contact information: [address] [place] [address] [phone]        [name], MD Follow up.   Specialty:  Cardiology Why:  The office will contact you directly with your appointment date and time. You will be seen by either Dr. Duke Salvia or an Advance Practice Provider on her team in ~2 weeks. Contact information: [address] [place] [address] [phone]          Discharge Instructions    Diet - low sodium heart healthy   Complete by:  As directed    Increase activity slowly   Complete by:  As directed       Discharge Medications   Allergies as of 05/06/2017      Reactions   Ace Inhibitors Cough   Warfarin Sodium Other (See Comments)   DOSE RELATED PHARMACOLOGIC EFFECT "bleed out"   Codeine Other (See Comments)   sedation   Delsym [dextromethorphan Polistirex Er] Other (See Comments)   dizziness   Lactose Intolerance (gi) Diarrhea   Phenylephrine Palpitations, Other (See Comments)   Nasal spray- "likely increase in nasal congestion".    Sulfamethoxazole-trimethoprim Nausea And Vomiting   GI intolerance.   Tramadol Other (See Comments)   Hallucinations, while on zoloft concurrently.        Medication List  STOP taking these medications   diltiazem 120 MG 24 hr capsule Commonly known as:  CARDIZEM CD   naproxen sodium 220 MG tablet Commonly known as:  ALEVE   torsemide 20 MG tablet Commonly known as:  DEMADEX     TAKE these medications   acetaminophen 650 MG CR tablet Commonly known as:  TYLENOL Take 1,300 mg by mouth every 8 (eight) hours as needed for pain.   apixaban 2.5 MG Tabs tablet Commonly known as:  ELIQUIS Take  1 tablet (2.5 mg total) by mouth 2 (two) times daily.   ARTIFICIAL TEAR OP Place 1 drop into both eyes daily as needed (for dry eyes).   Biotin 1000 MCG tablet Take 1,000 mcg by mouth daily.   digoxin 0.25 MG tablet Commonly known as:  LANOXIN Take 1 tablet (0.25 mg total) by mouth daily. Start taking on:  05/07/2017   docusate sodium 100 MG capsule Commonly known as:  COLACE Take 1 capsule (100 mg total) by mouth 2 (two) times daily.   furosemide 40 MG tablet Commonly known as:  LASIX Take 1 tablet (40 mg total) by mouth 2 (two) times daily.   losartan 25 MG tablet Commonly known as:  COZAAR Take 0.5 tablets (12.5 mg total) by mouth daily. Start taking on:  05/07/2017   metoprolol succinate 50 MG 24 hr tablet Commonly known as:  TOPROL-XL Take 1 tablet (50 mg total) by mouth daily. Take with or immediately following a meal. Start taking on:  05/07/2017 What changed:    medication strength  how much to take  additional instructions   potassium chloride SA 20 MEQ tablet Commonly known as:  K-DUR,KLOR-CON Take 1 tablet (20 mEq total) by mouth 2 (two) times daily.   ranitidine 150 MG tablet Commonly known as:  ZANTAC Take 150 mg by mouth daily.   sertraline 25 MG tablet Commonly known as:  ZOLOFT Take 25 mg by mouth at bedtime.   sertraline 50 MG tablet Commonly known as:  ZOLOFT Take 1 tablet (50 mg total) by mouth daily.   simethicone 80 MG chewable tablet Commonly known as:  MYLICON Chew 1 tablet (80 mg total) by mouth daily as needed. What changed:  reasons to take this   SUPER B COMPLEX PO Take 1 capsule by mouth daily.   VITAMIN D PO Take 1 capsule by mouth daily.            Durable Medical Equipment  (From admission, onward)        Start     Ordered   04/25/17 2229  DME Walker rolling  Once    Question:  Patient needs a walker to treat with the following condition  Answer:  Primary osteoarthritis of left knee   04/25/17 2228   04/25/17  2229  DME 3 n 1  Once     04/25/17 2228   04/25/17 2229  DME Bedside commode  Once    Question:  Patient needs a bedside commode to treat with the following condition  Answer:  Primary osteoarthritis of left knee   04/25/17 2228       Outstanding Labs/Studies   Ordered BMET for next week to assess electrolytes.   Will need follow-up echo in 3 months to assess LVEF.    Duration of Discharge Encounter   Greater than 30 minutes including physician time.  Signed, Beatriz Stallion PA-C 05/06/2017, 10:13 AM

## 2017-05-08 ENCOUNTER — Telehealth: Payer: Self-pay | Admitting: Cardiovascular Disease

## 2017-05-08 NOTE — Telephone Encounter (Signed)
-----   Message from Beatriz Stallion, PA-C sent at 05/06/2017  8:03 AM EST ----- Regarding: Follow-up appointment 1. Please schedule this patient for a follow-up appointment and call them with that information.  Primary Cardiologist: Dr. [name] or an APP on her team Date of Discharge: 05/06/2017 Appointment Needed Within: 1-2 weeks Appointment Type: Post-hospital follow up  2. Please schedule appointment for blood draw. Patient will need a BMET (ordered) next week.   Thank you!  Judy Pimple, PA-C

## 2017-05-08 NOTE — Telephone Encounter (Signed)
Patient scheduled for PAOV on Feb 7 @ 0830 with Corine Shelter PA MyChart message sent to patient with reminder for lab work needed

## 2017-05-12 ENCOUNTER — Other Ambulatory Visit: Payer: Self-pay

## 2017-05-12 ENCOUNTER — Other Ambulatory Visit: Payer: Medicare Other

## 2017-05-12 DIAGNOSIS — E876 Hypokalemia: Secondary | ICD-10-CM

## 2017-05-13 LAB — BASIC METABOLIC PANEL
BUN/Creatinine Ratio: 13 (ref 12–28)
BUN: 12 mg/dL (ref 8–27)
CO2: 25 mmol/L (ref 20–29)
Calcium: 9 mg/dL (ref 8.7–10.3)
Chloride: 94 mmol/L — ABNORMAL LOW (ref 96–106)
Creatinine, Ser: 0.89 mg/dL (ref 0.57–1.00)
GFR calc Af Amer: 69 mL/min/{1.73_m2} (ref >59)
GFR calc non Af Amer: 60 mL/min/{1.73_m2} (ref >59)
Glucose: 112 mg/dL — ABNORMAL HIGH (ref 65–99)
Potassium: 4.9 mmol/L (ref 3.5–5.2)
Sodium: 134 mmol/L (ref 134–144)

## 2017-05-16 ENCOUNTER — Encounter: Payer: Self-pay | Admitting: Cardiovascular Disease

## 2017-05-17 NOTE — Progress Notes (Addendum)
Cardiology Office Note:    Date:  05/18/2017   ID:  [name], DOB 09-Sep-1930, MRN [id number]  PCP:  [name], MD  Cardiologist:  [name], MD   Referring MD: [name], MD   Chief Complaint  Patient presents with  . Hospitalization Follow-up    atrial fibrillation    History of Present Illness:    [name] is a 84 y.o. female with a hx of chronic atrial fibrillation on low dose eliquis (failed TEE 06/19/15), NICM with EF of 50% per echo in 2017, chronic diastolic heart failure, LBBB, and prior spontaneous retroperitoneal bleed on coumadin and history of rectus sheath hematoma.   She was last seen by [name] in clinic on 03/13/17 and was cleared for surgery and instructed to hold eliquis for 2 days prior. She was in rate controlled Afib at that time.   Patient was seen by [place] during her recent hospitalization for total left knee arthroplasty on 04/25/17. Her course was complicated by afib RVR while in PACU and acute on chronic systolic and diastolic heart failure. Echo that admission with significant decrease in LVEF to 15%. He was diuresed with IV and oral lasix. Nuclear stress test was without clear reversible ischemia and NICM was suspected. Low dose of losartan was started in addition to PO lasix. Her Afib was rate-controlled with toprol and digoxin.  Trial of IV amiodarone was unsuccessful, but rate controlled with digoxin. She was anticoagulated with low dose eliquis (for age and previous bleed on coumadin). She was discharged on 40 mg lasix BID on 05/06/17.   Since discharge, she continues to work with physical therapy. EKG today with atrial fibrillation with ventricular rate of 90 bpm.  Left bundle branch block is evident.  She is largely unaware of her Afib.  She denies palpitations or rapid heart rate.  She denies chest pain, shortness of breath, dyspnea on exertion, orthopnea, lower extremity swelling, and problems with bleeding.  She states that  she feels quite sluggish, cognitively dulled, and fatigued.  We discussed that she is recovering from a prolonged hospitalization and anesthesia.  She is on a higher dose of Toprol at 50 mg daily compared to her previous 25 mg daily.  She is also on new digoxin 0.25 mg.  We discussed waiting to change any doses of medications until she has only recovered from her hospitalization.  I do not want to increase the chances that she will revert to RVR.  We discussed cutting the tramadol in half versus stopping the tramadol to help fatigue.  She is having problems with anxiety in the evenings and questions her dose of Zoloft.  I asked her to follow-up with her GP concerning Zoloft dosage changes as they are the ones managing this medication.  Overall, I think she is doing quite well.   Past Medical History:  Diagnosis Date  . Acute combined systolic and diastolic heart failure (HCC) 05/02/2017  . Anxiety   . Arthritis    "knees" (06/18/2015)  . Atrial fibrillation (HCC)    h/o sig bleed on coumadin  . Atrial fibrillation with RVR (HCC) 06/18/2015  . Cardiogenic shock (HCC) 05/02/2017  . CHF (congestive heart failure) (HCC)   . Complication of anesthesia 2010   "w/cardiac cath; got into a psychotic state for ~ 3 days; got me out w/Valium"  . Depression    "I have it off and on; not as often as I've gotten older" (06/18/2015)  . Diverticulosis  descending and sigmoid colon--Dr. Jarold Motto  . DJD (degenerative joint disease) of knee    left  . Dysrhythmia   . GERD (gastroesophageal reflux disease)   . Insomnia     off chronic ambien 5mg  as of 10/11, rare/episodic use since then  DCM/CHF  . Nonischemic cardiomyopathy (HCC)    normal coronaries, EF 15-20% 04/2008, EF 50-55% 2015  . Osteoporosis    on DXA 05/2010  . Sciatica    Right    Past Surgical History:  Procedure Laterality Date  . CARDIAC CATHETERIZATION  2010  . CARDIOVERSION N/A 06/19/2015   Procedure: CARDIOVERSION;  Surgeon: Laurey Morale, MD;  Location: [facility] ENDOSCOPY;  Service: Cardiovascular;  Laterality: N/A;  . CATARACT EXTRACTION W/ INTRAOCULAR LENS  IMPLANT, BILATERAL Bilateral   . DILATION AND CURETTAGE OF UTERUS    . EYE SURGERY    . TEE WITHOUT CARDIOVERSION N/A 06/19/2015   Procedure: TRANSESOPHAGEAL ECHOCARDIOGRAM (TEE);  Surgeon: Laurey Morale, MD;  Location: [facility] ENDOSCOPY;  Service: Cardiovascular;  Laterality: N/A;  . TOTAL KNEE ARTHROPLASTY Left 04/25/2017   Procedure: TOTAL KNEE ARTHROPLASTY;  Surgeon: [name], MD;  Location: [facility] OR;  Service: Orthopedics;  Laterality: Left;  [name] VAGINAL HYSTERECTOMY  1979   ovaries intact    Current Medications: Current Meds  Medication Sig  . apixaban (ELIQUIS) 2.5 MG TABS tablet Take 1 tablet (2.5 mg total) by mouth 2 (two) times daily.  . digoxin (LANOXIN) 0.25 MG tablet Take 1 tablet (0.25 mg total) by mouth daily.  . furosemide (LASIX) 40 MG tablet Take 1 tablet (40 mg total) by mouth 2 (two) times daily.  [name] losartan (COZAAR) 25 MG tablet Take 0.5 tablets (12.5 mg total) by mouth daily.  . metoprolol succinate (TOPROL-XL) 50 MG 24 hr tablet Take 1 tablet (50 mg total) by mouth daily. Take with or immediately following a meal.  . potassium chloride SA (K-DUR,KLOR-CON) 20 MEQ tablet Take 1 tablet (20 mEq total) by mouth 2 (two) times daily.  . ranitidine (ZANTAC) 150 MG tablet Take 150 mg by mouth daily.   . sertraline (ZOLOFT) 25 MG tablet Take 25 mg by mouth at bedtime.  . sertraline (ZOLOFT) 50 MG tablet Take 1 tablet (50 mg total) by mouth daily.  . traMADol (ULTRAM) 50 MG tablet Take 50 mg by mouth every 6 (six) hours as needed.  . [DISCONTINUED] Cholecalciferol (VITAMIN D PO) Take 1 capsule by mouth daily.   . [DISCONTINUED] docusate sodium (COLACE) 100 MG capsule Take 1 capsule (100 mg total) by mouth 2 (two) times daily.     Allergies:   Ace inhibitors; Warfarin sodium; Codeine; Delsym [dextromethorphan polistirex er]; Lactose intolerance (gi);  Phenylephrine; Sulfamethoxazole-trimethoprim; and Tramadol   Social History   Socioeconomic History  . Marital status: Married    Spouse name: None  . Number of children: 2  . Years of education: None  . Highest education level: None  Social Needs  . Financial resource strain: None  . Food insecurity - worry: None  . Food insecurity - inability: None  . Transportation needs - medical: None  . Transportation needs - non-medical: None  Occupational History  . Occupation: Retired    Associate Professor: RETIRED  Tobacco Use  . Smoking status: Never Smoker  . Smokeless tobacco: Never Used  Substance and Sexual Activity  . Alcohol use: No    Alcohol/week: 0.0 oz  . Drug use: No  . Sexual activity: No  Other Topics Concern  . None  Social History Narrative   Retired, former [employer]   Married, 1953   Lives with husband   2 grown children, one in [place], one out of state   Tobacco Use - No.    Drug Use - no   teaches Sunday school, reading     Family History: The patient's family history includes Cancer in her father; Colon cancer in her father; Tuberculosis in her mother. There is no history of Breast cancer.  ROS:   Please see the history of present illness.    All other systems reviewed and are negative.  EKGs/Labs/Other Studies Reviewed:    The following studies were reviewed today:  V/Q Scan 04/28/17: IMPRESSION: Low probability pulmonary embolus. Multiple bilateral ventilatory defects may be related to CHF noted on today's chest x-ray.  ECHO 04/30/17: Study Conclusions  - Left ventricle: The cavity size was moderately dilated. Wall thickness was normal. Systolic function was severely reduced. The estimated ejection fraction was 15%. No mural thrombus with Definity contrast. Diffuse hypokinesis. The study is not technically sufficient to allow evaluation of LV diastolic function. - Aortic valve: Mildly calcified leaflets. There was no  stenosis. There was no regurgitation. - Mitral valve: Mildly thickened leaflets . There was moderate regurgitation. - Left atrium: Severely dilated. - Right ventricle: The cavity size was mildly dilated. Mildly reduced systolic function. - Right atrium: Severely dilated. - Tricuspid valve: There was moderate to severe regurgitation. - Pulmonary arteries: PA peak pressure: 45 mm Hg (S). - Inferior vena cava: The vessel was dilated. The respirophasic diameter changes were blunted (<50%), consistent with elevated central venous pressure. - Pericardium, extracardiac: There was a left pleural effusion.  Impressions:  - Compared to a prior study in 06/2015, the LVEF has markedly reduced from 50% to 15% with global hypokinesis.  NST 05/04/17: IMPRESSION: 1. Potential subtle ischemia involving the left ventricular apex, distal septum and distal anteroseptal wall. Mildly diminished perfusion on both stress and rest studies of the distal anterior wall may be related to scar or breast soft tissue attenuation.  2. Severe wall motion abnormalities with septal dyskinesis, apical akinesis and otherwise global hypokinesis. The left ventricular cavity is significantly dilated.  3. Left ventricular ejection fraction: 23%.  4. Non invasive risk stratification*: High    EKG:  EKG is ordered today.  The ekg ordered today demonstrates atrial fibrillation, ventricular rate 90 bpm, left bundle branch block  Recent Labs: 04/27/2017: Magnesium 2.1 05/01/2017: B Natriuretic Peptide 4,284.7; TSH 2.685 05/18/2017: ALT 24; BUN 13; Creatinine, Ser 0.78; Hemoglobin 14.3; NT-Pro BNP 6,071; Platelets 448; Potassium 4.9; Sodium 134  Recent Lipid Panel    Component Value Date/Time   CHOL 209 (H) 01/30/2017 1451   TRIG 151.0 (H) 01/30/2017 1451   HDL 55.50 01/30/2017 1451   CHOLHDL 4 01/30/2017 1451   VLDL 30.2 01/30/2017 1451   LDLCALC 124 (H) 01/30/2017 1451   LDLDIRECT 56.0 10/17/2014  0836    Physical Exam:    VS:  BP 130/76   Pulse 90   Ht 5\' 5"  (1.651 m)   Wt 157 lb (71.2 kg)   BMI 26.13 kg/m     Wt Readings from Last 3 Encounters:  05/18/17 157 lb (71.2 kg)  05/06/17 165 lb 8 oz (75.1 kg)  04/19/17 168 lb (76.2 kg)     GEN: elderly appearing female in no acute distress, she is in a wheelchair HEENT: Normal NECK: No JVD; No carotid bruits LYMPHATICS: No lymphadenopathy CARDIAC: Irregular rhythm, regular rate,  no murmurs, rubs, gallops RESPIRATORY:  Clear to auscultation without rales, wheezing or rhonchi  ABDOMEN: Soft, non-tender, non-distended MUSCULOSKELETAL:  Minimal LE edema; No deformity  SKIN: Warm and dry NEUROLOGIC:  Alert and oriented x 3 PSYCHIATRIC:  Normal affect   ASSESSMENT:    1. Atrial fibrillation with RVR- CHADs VASc=4   2. LBBB (left bundle branch block)   3. Nonischemic cardiomyopathy (HCC)   4. Chronic combined systolic and diastolic heart failure (HCC)    PLAN:    In order of problems listed above:  Atrial fibrillation with RVR- CHADs VASc=4 LBBB (left bundle branch block) EKG today with rate controlled Afib and LBBB. She denies chest pain and palpitations. She seems to be doing well on the higher dose of toprol and digoxin. She is compliant on eliquis. She denies bleeding problems. We discussed possibly decreasing medications as she continues to recover from her hospitalization and Afib RVR. I think its too early to start decreasing meds today.    Nonischemic cardiomyopathy (HCC) Chronic combined systolic and diastolic heart failure (HCC) Pt is taking lasix and K twice daily. Her dry weight at last clinic visit with [name] was 166 lbs and she states this was her normal weight. According to home weight log, she is down to 157.6 lbs. She denies SOB, DOE, orthopnea, and increased LE swelling. I will obtain CBC, CMP, and BNP today. I instructed the patient and family to contact our office if her weight drops below 155  pounds or if she starts gaining 3 pounds in 1 day or 5 pounds in 1 week.   Her pressures are marginal with systolic pressure in the 90s-110s.  I do not have room to increase losartan at this time.  As medications are titrated, I asked her to follow-up in 2-3 weeks.  She prefers to follow with Dr. Duke Salvia.   Medication Adjustments/Labs and Tests Ordered: Current medicines are reviewed at length with the patient today.  Concerns regarding medicines are outlined above.  Orders Placed This Encounter  Procedures  . CBC  . Pro b natriuretic peptide (BNP)  . Comprehensive metabolic panel  . EKG 12-Lead    Signed, Marcelino Duster, [place]  05/18/2017 4:50 PM    [facility]

## 2017-05-18 ENCOUNTER — Encounter: Payer: Self-pay | Admitting: Cardiology

## 2017-05-18 ENCOUNTER — Ambulatory Visit: Payer: Medicare Other | Admitting: Cardiology

## 2017-05-18 VITALS — BP 130/76 | HR 90 | Ht 65.0 in | Wt 157.0 lb

## 2017-05-18 DIAGNOSIS — I428 Other cardiomyopathies: Secondary | ICD-10-CM | POA: Diagnosis not present

## 2017-05-18 DIAGNOSIS — I4891 Unspecified atrial fibrillation: Secondary | ICD-10-CM

## 2017-05-18 DIAGNOSIS — I447 Left bundle-branch block, unspecified: Secondary | ICD-10-CM

## 2017-05-18 DIAGNOSIS — I5042 Chronic combined systolic (congestive) and diastolic (congestive) heart failure: Secondary | ICD-10-CM

## 2017-05-18 LAB — COMPREHENSIVE METABOLIC PANEL
ALT: 24 IU/L (ref 0–32)
AST: 25 IU/L (ref 0–40)
Albumin/Globulin Ratio: 1.5 (ref 1.2–2.2)
Albumin: 3.8 g/dL (ref 3.5–4.7)
Alkaline Phosphatase: 188 IU/L — ABNORMAL HIGH (ref 39–117)
BUN/Creatinine Ratio: 17 (ref 12–28)
BUN: 13 mg/dL (ref 8–27)
Bilirubin Total: 1 mg/dL (ref 0.0–1.2)
CO2: 25 mmol/L (ref 20–29)
Calcium: 9.7 mg/dL (ref 8.7–10.3)
Chloride: 94 mmol/L — ABNORMAL LOW (ref 96–106)
Creatinine, Ser: 0.78 mg/dL (ref 0.57–1.00)
GFR calc Af Amer: 81 mL/min/{1.73_m2} (ref >59)
GFR calc non Af Amer: 70 mL/min/{1.73_m2} (ref >59)
Globulin, Total: 2.6 g/dL (ref 1.5–4.5)
Glucose: 99 mg/dL (ref 65–99)
Potassium: 4.9 mmol/L (ref 3.5–5.2)
Sodium: 134 mmol/L (ref 134–144)
Total Protein: 6.4 g/dL (ref 6.0–8.5)

## 2017-05-18 LAB — CBC
Hematocrit: 42.7 % (ref 34.0–46.6)
Hemoglobin: 14.3 g/dL (ref 11.1–15.9)
MCH: 30.8 pg (ref 26.6–33.0)
MCHC: 33.5 g/dL (ref 31.5–35.7)
MCV: 92 fL (ref 79–97)
Platelets: 448 10*3/uL — ABNORMAL HIGH (ref 150–379)
RBC: 4.64 x10E6/uL (ref 3.77–5.28)
RDW: 14.6 % (ref 12.3–15.4)
WBC: 6.5 10*3/uL (ref 3.4–10.8)

## 2017-05-18 LAB — PRO B NATRIURETIC PEPTIDE: NT-Pro BNP: 6071 pg/mL — ABNORMAL HIGH (ref 0–738)

## 2017-05-18 NOTE — Patient Instructions (Addendum)
Your physician recommends that you return for lab work TODAY  Your physician recommends that you schedule a follow-up appointment in 2-3 weeks with PA or NP  You are scheduled for an appointment with [name] on March 5th

## 2017-05-22 ENCOUNTER — Encounter: Payer: Self-pay | Admitting: Cardiovascular Disease

## 2017-05-25 ENCOUNTER — Encounter: Payer: Self-pay | Admitting: Family Medicine

## 2017-05-25 ENCOUNTER — Telehealth: Payer: Self-pay | Admitting: *Deleted

## 2017-05-25 ENCOUNTER — Other Ambulatory Visit: Payer: Self-pay

## 2017-05-25 MED ORDER — DIGOXIN 250 MCG PO TABS: 0.2500 mg | ORAL_TABLET | Freq: Every day | ORAL | 2 refills | Status: DC

## 2017-05-25 NOTE — Telephone Encounter (Addendum)
PA started for Digoxin  Ref #[id number]

## 2017-05-28 ENCOUNTER — Telehealth: Payer: Self-pay | Admitting: Family Medicine

## 2017-05-28 NOTE — Telephone Encounter (Signed)
-----   Message from Annamarie Major, [place] sent at 05/25/2017 12:54 PM EST ----- Patient says she is doing fine.  She was hospitalized with CHF after her knee surgery and is now home with rehab and is improving.  She states she is taking Sertraline twice daily and is doing ok. ----- Message ----- From: [name], MD Sent: 05/24/2017 To: Annamarie Major, CMA  If patient hasn't scheduled f/u, then check with her about her mood.  She had mentioned her mood/sertraline dose to cardiology at follow up.    Thanks.   Clelia Croft

## 2017-05-28 NOTE — Telephone Encounter (Signed)
Noted. Thanks.

## 2017-05-29 ENCOUNTER — Telehealth: Payer: Self-pay | Admitting: Cardiovascular Disease

## 2017-05-29 MED ORDER — DIGOXIN 250 MCG PO TABS: 0.2500 mg | ORAL_TABLET | Freq: Every day | ORAL | 2 refills | Status: DC

## 2017-05-29 NOTE — Telephone Encounter (Signed)
Received message from patient drug approved. Called pharmacy to let them know and patient had already picked up

## 2017-05-29 NOTE — Telephone Encounter (Signed)
New message     *STAT* If patient is at the pharmacy, call can be transferred to refill team.   1. Which medications need to be refilled? (please list name of each medication and dose if known) digoxin (LANOXIN) 0.25 MG tablet  2. Which pharmacy/location (including street and city if local pharmacy) is medication to be sent to? [facility] [address], [place], [place]   3. Do they need a 30 day or 90 day supply? 90

## 2017-06-02 ENCOUNTER — Encounter: Payer: Self-pay | Admitting: Physician Assistant

## 2017-06-02 ENCOUNTER — Ambulatory Visit: Payer: Medicare Other | Admitting: Physician Assistant

## 2017-06-02 VITALS — BP 118/62 | HR 90 | Ht 65.0 in | Wt 160.0 lb

## 2017-06-02 DIAGNOSIS — Z7901 Long term (current) use of anticoagulants: Secondary | ICD-10-CM | POA: Diagnosis not present

## 2017-06-02 DIAGNOSIS — I5042 Chronic combined systolic (congestive) and diastolic (congestive) heart failure: Secondary | ICD-10-CM | POA: Diagnosis not present

## 2017-06-02 DIAGNOSIS — I952 Hypotension due to drugs: Secondary | ICD-10-CM

## 2017-06-02 DIAGNOSIS — I428 Other cardiomyopathies: Secondary | ICD-10-CM | POA: Diagnosis not present

## 2017-06-02 DIAGNOSIS — I482 Chronic atrial fibrillation: Secondary | ICD-10-CM | POA: Diagnosis not present

## 2017-06-02 DIAGNOSIS — I4821 Permanent atrial fibrillation: Secondary | ICD-10-CM

## 2017-06-02 LAB — BASIC METABOLIC PANEL
BUN/Creatinine Ratio: 21 (ref 12–28)
BUN: 16 mg/dL (ref 8–27)
CO2: 25 mmol/L (ref 20–29)
Calcium: 9.6 mg/dL (ref 8.7–10.3)
Chloride: 95 mmol/L — ABNORMAL LOW (ref 96–106)
Creatinine, Ser: 0.75 mg/dL (ref 0.57–1.00)
GFR calc Af Amer: 85 mL/min/{1.73_m2} (ref >59)
GFR calc non Af Amer: 73 mL/min/{1.73_m2} (ref >59)
Glucose: 103 mg/dL — ABNORMAL HIGH (ref 65–99)
Potassium: 4.4 mmol/L (ref 3.5–5.2)
Sodium: 136 mmol/L (ref 134–144)

## 2017-06-02 LAB — DIGOXIN LEVEL: Digoxin, Serum: 2 ng/mL — ABNORMAL HIGH (ref 0.5–0.9)

## 2017-06-02 MED ORDER — FUROSEMIDE 40 MG PO TABS: 40.0000 mg | ORAL_TABLET | Freq: Two times a day (BID) | ORAL | 3 refills | Status: DC

## 2017-06-02 NOTE — Patient Instructions (Addendum)
Medication Instructions:  CONTINUE Lasix, but Second dose is taken as needed for 3 pound weight gain or 5 pound in 1 week weight gain   Labwork: Your physician recommends that you return for lab work in: TODAY-BMET, DIGOXIN LEVEL  Testing/Procedures: NONE   Follow-Up: Your physician recommends that you schedule a follow-up appointment in: 4-6 weeks with DR Shields.  Any Other Special Instructions Will Be Listed Below (If Applicable). 1. CHECK your blood pressure daily at various times-call if your blood pressure is under 100 2. CHECK  Your weight daily; your target weight is 157 3. EAT a 2000mg  sodium diet a day or 500mg  of sodium a meal  If you need a refill on your cardiac medications before your next appointment, please call your pharmacy.

## 2017-06-02 NOTE — Progress Notes (Signed)
Cardiology Office Note   Date:  06/02/2017   ID:  [name], DOB 1930/11/01, MRN [id number]  PCP:  [name], MD  Cardiologist: Dr. Duke Salvia, 05/05/2017 in hospital Catherine Demark, PA-C   Chief Complaint  Patient presents with  . Follow-up    History of Present Illness: [name] is a 84 y.o. female with a history of chronic Afib on Eliquis (failed TEE/DCCV 06/19/15), NICM with EF of 50% per echo in 2017, D-CHF, LBBB, prior spont retroperitoneal bleed when on coumadin   Admitted 1/15-1/26/2019 for L- TKR, S-D-CHF w/ EF now 15%, MV w/out ischemia and EF 23%, wt at d/c 165 lbs, f/u echo 3 months, had rectus sheath hematoma when on heparin, afib w/ RVR, intol amio, on dig and Toprol XL.  Lasix 40 mg twice daily and losartan added 05/18/2017 office visit, weight 157.9, volume status good, labs checked and were stable  [name] presents for cardiology follow up.  She is here with her husband and daughter  She is doing PT at home, feels she is progressing well. Her legs are weak, getting stronger.   She is compliant with the Eliquis, no bleeding issues.   She is unaware of the atrial fib, never has palpitations. She gets light-headed or dizzy at times, feels her brain is foggy since discharge.   She has episodes, mostly in the evenings where she will start to shake. She is not having pain or SOB at that time. Mostly her hands shake but she will feel a fluttering in her chest at times as well. She thinks she is nervous or tired.  She has not and checking her blood pressure when she is having the spells.  She has been drinking Pedialyte at times, thinking she will need the electrolytes.  She just does not feel that good, wonders if she will get better.  Her activity is poor, she is to be more active.  Her appetite is poor, she admits that she has not been eating very much.  She will drink an Ensure or Glucerna although some of those have given her diarrhea, she  does not do that very often.  She will eat a meal replacement bar and has had success tolerating those.  Her husband has been recording her weight and blood pressure and heart rate daily.  Her weight dropped a little after discharge, but she has generally been 157 or 158 pounds.  Her heart rate is generally controlled in the 70s through 90s.  Her blood pressure has been low at times, as low as 95/54.  He generally takes her blood pressure in the morning, so this would be before her medications.   Past Medical History:  Diagnosis Date  . Acute combined systolic and diastolic heart failure (HCC) 05/02/2017  . Anxiety   . Arthritis    "knees" (06/18/2015)  . Atrial fibrillation (HCC)    h/o sig bleed on coumadin  . Atrial fibrillation with RVR (HCC) 06/18/2015  . Cardiogenic shock (HCC) 05/02/2017  . CHF (congestive heart failure) (HCC)   . Complication of anesthesia 2010   "w/cardiac cath; got into a psychotic state for ~ 3 days; got me out w/Valium"  . Depression    "I have it off and on; not as often as I've gotten older" (06/18/2015)  . Diverticulosis    descending and sigmoid colon--Dr. Jarold Motto  . DJD (degenerative joint disease) of knee    left  . Dysrhythmia   .  GERD (gastroesophageal reflux disease)   . Insomnia     off chronic ambien 5mg  as of 10/11, rare/episodic use since then  DCM/CHF  . Nonischemic cardiomyopathy (HCC)    normal coronaries, EF 15-20% 04/2008, EF 50-55% 2015  . Osteoporosis    on DXA 05/2010  . Sciatica    Right    Past Surgical History:  Procedure Laterality Date  . CARDIAC CATHETERIZATION  2010  . CARDIOVERSION N/A 06/19/2015   Procedure: CARDIOVERSION;  Surgeon: Laurey Morale, MD;  Location: [facility] ENDOSCOPY;  Service: Cardiovascular;  Laterality: N/A;  . CATARACT EXTRACTION W/ INTRAOCULAR LENS  IMPLANT, BILATERAL Bilateral   . DILATION AND CURETTAGE OF UTERUS    . EYE SURGERY    . TEE WITHOUT CARDIOVERSION N/A 06/19/2015   Procedure: TRANSESOPHAGEAL  ECHOCARDIOGRAM (TEE);  Surgeon: Laurey Morale, MD;  Location: [facility] ENDOSCOPY;  Service: Cardiovascular;  Laterality: N/A;  . TOTAL KNEE ARTHROPLASTY Left 04/25/2017   Procedure: TOTAL KNEE ARTHROPLASTY;  Surgeon: [name], MD;  Location: [facility] OR;  Service: Orthopedics;  Laterality: Left;  [name] VAGINAL HYSTERECTOMY  1979   ovaries intact    Current Outpatient Medications  Medication Sig Dispense Refill  . acetaminophen (TYLENOL) 500 MG tablet Take 500 mg by mouth 2 (two) times daily as needed.    [name] apixaban (ELIQUIS) 2.5 MG TABS tablet Take 1 tablet (2.5 mg total) by mouth 2 (two) times daily. 60 tablet 6  . digoxin (LANOXIN) 0.25 MG tablet Take 1 tablet (0.25 mg total) by mouth daily. 90 tablet 2  . furosemide (LASIX) 40 MG tablet Take 1 tablet (40 mg total) by mouth 2 (two) times daily. 60 tablet 3  . losartan (COZAAR) 25 MG tablet Take 0.5 tablets (12.5 mg total) by mouth daily. 15 tablet 3  . metoprolol succinate (TOPROL-XL) 50 MG 24 hr tablet Take 1 tablet (50 mg total) by mouth daily. Take with or immediately following a meal. 90 tablet 3  . potassium chloride SA (K-DUR,KLOR-CON) 20 MEQ tablet Take 1 tablet (20 mEq total) by mouth 2 (two) times daily. 60 tablet 2  . ranitidine (ZANTAC) 150 MG tablet Take 150 mg by mouth daily.     . sertraline (ZOLOFT) 25 MG tablet Take 25 mg by mouth at bedtime.    . sertraline (ZOLOFT) 50 MG tablet Take 1 tablet (50 mg total) by mouth daily. (Patient taking differently: Take 50 mg by mouth 2 (two) times daily. )    . traMADol (ULTRAM) 50 MG tablet Take 50 mg by mouth every 6 (six) hours as needed.     No current facility-administered medications for this visit.     Allergies:   Ace inhibitors; Warfarin sodium; Codeine; Delsym [dextromethorphan polistirex er]; Lactose intolerance (gi); Phenylephrine; Sulfamethoxazole-trimethoprim; and Tramadol    Social History:  The patient  reports that  has never smoked. she has never used smokeless tobacco. She  reports that she does not drink alcohol or use drugs.   Family History:  The patient's family history includes Cancer in her father; Colon cancer in her father; Tuberculosis in her mother.    ROS:  Please see the history of present illness. All other systems are reviewed and negative.    PHYSICAL EXAM: VS:  BP 118/62   Pulse 90   Ht 5\' 5"  (1.651 m)   Wt 160 lb (72.6 kg)   BMI 26.63 kg/m  , BMI Body mass index is 26.63 kg/m. GEN: Well nourished, well developed, female in no acute  distress  HEENT: normal for age  Neck: Minimal JVD, no carotid bruit, no masses Cardiac: Irregular rate and rhythm; soft murmur, no rubs, or gallops Respiratory:  clear to auscultation bilaterally, normal work of breathing GI: soft, nontender, nondistended, + BS MS: no deformity or atrophy; no edema; distal pulses are 2+ in all 4 extremities   Skin: warm and dry, no rash Neuro:  Strength and sensation are intact Psych: euthymic mood, full affect   EKG:  EKG is ordered today. The ekg ordered today demonstrates atrial fibrillation, left bundle branch block is old, heart rate 90  ECHO: 04/30/2017 - Left ventricle: The cavity size was moderately dilated. Wall   thickness was normal. Systolic function was severely reduced. The   estimated ejection fraction was 15%. No mural thrombus with   Definity contrast. Diffuse hypokinesis. The study is not   technically sufficient to allow evaluation of LV diastolic   function. - Aortic valve: Mildly calcified leaflets. There was no stenosis.   There was no regurgitation. - Mitral valve: Mildly thickened leaflets . There was moderate   regurgitation. - Left atrium: Severely dilated. - Right ventricle: The cavity size was mildly dilated. Mildly   reduced systolic function. - Right atrium: Severely dilated. - Tricuspid valve: There was moderate to severe regurgitation. - Pulmonary arteries: PA peak pressure: 45 mm Hg (S). - Inferior vena cava: The vessel was  dilated. The respirophasic   diameter changes were blunted (< 50%), consistent with elevated   central venous pressure. - Pericardium, extracardiac: There was a left pleural effusion. Impressions: - Compared to a prior study in 06/2015, the LVEF has markedly   reduced from 50% to 15% with global hypokinesis.  MV 05/04/17: IMPRESSION: 1. Potential subtle ischemia involving the left ventricular apex, distal septum and distal anteroseptal wall. Mildly diminished perfusion on both stress and rest studies of the distal anterior wall may be related to scar or breast soft tissue attenuation. 2. Severe wall motion abnormalities with septal dyskinesis, apical akinesis and otherwise global hypokinesis. The left ventricular cavity is significantly dilated. 3. Left ventricular ejection fraction: 23%. 4. Non invasive risk stratification*: High   Recent Labs: 04/27/2017: Magnesium 2.1 05/01/2017: B Natriuretic Peptide 4,284.7; TSH 2.685 05/18/2017: ALT 24; Hemoglobin 14.3; NT-Pro BNP 6,071; Platelets 448 06/02/2017: BUN 16; Creatinine, Ser 0.75; Potassium 4.4; Sodium 136    Lipid Panel    Component Value Date/Time   CHOL 209 (H) 01/30/2017 1451   TRIG 151.0 (H) 01/30/2017 1451   HDL 55.50 01/30/2017 1451   CHOLHDL 4 01/30/2017 1451   VLDL 30.2 01/30/2017 1451   LDLCALC 124 (H) 01/30/2017 1451   LDLDIRECT 56.0 10/17/2014 0836     Wt Readings from Last 3 Encounters:  06/02/17 160 lb (72.6 kg)  05/18/17 157 lb (71.2 kg)  05/06/17 165 lb 8 oz (75.1 kg)     Other studies Reviewed: Additional studies/ records that were reviewed today include: Hospital records and testing  ASSESSMENT AND PLAN:  1.  Permanent atrial fibrillation: Her heart rate is controlled, continue metoprolol and Lanoxin as blood pressure will tolerate.  Check a dig level today  2.  Chronic combined systolic and diastolic CHF: Her volume status is good by exam.  Her weight at home has been stable.  I advised that her  dry weight was 157 pounds.  I would like for her blood pressure to be a little higher.  I will continue the a.m. dose of Lasix but make the p.m. dose as needed  only based on weight gain.  They are compliant with a low-sodium diet, although they need to be a little bit more conscious of hidden salt in foods.  Check a BMET today and decide on potassium dosing based on the results.  3.Chronic anticoagulation: She has had a retroperitoneal hematoma on Coumadin and a rectus hematoma on heparin.  She is tolerating the Eliquis well.  4.  Hypotension: Her systolic blood pressure has been in the 90s frequently at home.  It is okay today.  I explained that because of her atrial fibrillation, I want to keep the metoprolol on board at the current dose if she could tolerate it.  She understands that if her blood pressure does not stay above 100 on the reduced dose of Lasix or if she needs the higher dose of Lasix to maintain her volume, we will have to go back on the metoprolol.  5.  Nonischemic cardiomyopathy: She is on a beta-blocker and a minimal dose of losartan at 12.5 mg a day.  Her blood pressure is too low to add anything else.  The Lasix could be further decreased in Spironolactone at a low dose added, will leave this to Dr. Duke Salvia.   Current medicines are reviewed at length with the patient today.  The patient has concerns regarding medicines.  Concerns were addressed  The following changes have been made: Make the p.m. Lasix dose as needed only  Labs/ tests ordered today include:   Orders Placed This Encounter  Procedures  . Basic metabolic panel  . Digoxin level  . EKG 12-Lead     Disposition:   FU with Dr. Duke Salvia  Signed, Catherine Demark, PA-C  06/02/2017 5:20 PM    [facility] Phone: [phone]; Fax: [phone]  This note was written with the assistance of speech recognition software. Please excuse any transcriptional errors.

## 2017-06-06 ENCOUNTER — Other Ambulatory Visit: Payer: Self-pay

## 2017-06-06 MED ORDER — DIGOXIN 250 MCG PO TABS: 0.1250 mg | ORAL_TABLET | Freq: Every day | ORAL | 2 refills | Status: DC

## 2017-06-13 ENCOUNTER — Ambulatory Visit: Payer: Medicare Other | Admitting: Cardiovascular Disease

## 2017-07-18 ENCOUNTER — Encounter: Payer: Self-pay | Admitting: Cardiovascular Disease

## 2017-07-21 ENCOUNTER — Encounter: Payer: Self-pay | Admitting: Cardiovascular Disease

## 2017-07-21 ENCOUNTER — Ambulatory Visit: Payer: Medicare Other | Admitting: Cardiovascular Disease

## 2017-07-21 VITALS — BP 104/64 | HR 88 | Ht 65.0 in | Wt 164.0 lb

## 2017-07-21 DIAGNOSIS — I952 Hypotension due to drugs: Secondary | ICD-10-CM

## 2017-07-21 DIAGNOSIS — Z7901 Long term (current) use of anticoagulants: Secondary | ICD-10-CM

## 2017-07-21 DIAGNOSIS — I428 Other cardiomyopathies: Secondary | ICD-10-CM

## 2017-07-21 DIAGNOSIS — I4821 Permanent atrial fibrillation: Secondary | ICD-10-CM

## 2017-07-21 DIAGNOSIS — Z79899 Other long term (current) drug therapy: Secondary | ICD-10-CM | POA: Diagnosis not present

## 2017-07-21 DIAGNOSIS — I5042 Chronic combined systolic (congestive) and diastolic (congestive) heart failure: Secondary | ICD-10-CM | POA: Diagnosis not present

## 2017-07-21 DIAGNOSIS — I482 Chronic atrial fibrillation: Secondary | ICD-10-CM | POA: Diagnosis not present

## 2017-07-21 DIAGNOSIS — I447 Left bundle-branch block, unspecified: Secondary | ICD-10-CM

## 2017-07-21 NOTE — Patient Instructions (Addendum)
Medication Instructions:  If weight 160 or greater take Lasix 40 mg two times a day.   STOP LOSARTAN   Labwork: Your physician recommends that you return for lab work in: Monday 07/24/17 (BMP, Digoxin Level) Hold Morning dose of Digoxin the day of labs.   Testing/Procedures: Your physician has requested that you have an echocardiogram after July 29, 2017. Echocardiography is a painless test that uses sound waves to create images of your heart. It provides your doctor with information about the size and shape of your heart and how well your heart's chambers and valves are working. This procedure takes approximately one hour. There are no restrictions for this procedure.    Follow-Up: Your physician recommends that you schedule a follow-up appointment in: 2 months with Dr. Duke Salvia   Any Other Special Instructions Will Be Listed Below (If Applicable).     If you need a refill on your cardiac medications before your next appointment, please call your pharmacy.

## 2017-07-21 NOTE — Progress Notes (Signed)
Cardiology Office Note   Date:  07/29/2017   ID:  [name], DOB December 14, 1930, MRN [id number]  PCP:  [name], MD  Cardiologist:   [name], MD   Chief Complaint  Patient presents with  . Follow-up  . Shortness of Breath  . Dizziness  . Edema    Ankles.     History of Present Illness: [name] is a 84 y.o. female with chronic atrial fibrillation, chronic systolic and diastolic heart failure, left bundle branch block, spontaneous retroperitoneal bleed on Coumadin who presents for follow-up.  She is previously seen by [name] in clinic 03/2017 and cleared for surgery.  At that time she was in atrial fibrillation with well-controlled rates.  She underwent left knee replacement 04/2017.  Her hospital course was comp gated by atrial fibrillation with rapid ventricular response and acute on chronic systolic and diastolic heart failure.  Echo that admission revealed LVEF 15% which was down from 50% in 2017.  She was diuresed with IV Lasix and had a Lexiscan Myoview without evidence of ischemia.  She failed amiodarone and was started on digoxin.    [name] followed up with [name], PA, ad was doing well on 05/2017.  She was struggling with anxiety but asymptomatic from a cardiac standpoint.  She then followed up with [name], PA-C on 06/02/17 and reported episodes of feeling fluttering in her chest.  She also noted that her appetite was poor and that she had not been very active.  She was thought to be dry and Lasix was switched to once a day.  She continues to feel tired and nervous.  She also feels shaky.  Her BP has been ranging from 90s-120/60-70s.  Her weight has been 156-157.  She rarely has palpitations and only recalls being in atrial fibrillation once.  She has no chest pain or shortness of breath.  She complains of diarrhea daily since being in the hospital.  She hasn't been able to exercise due to knee pain.     Past Medical History:    Diagnosis Date  . Acute combined systolic and diastolic heart failure (HCC) 05/02/2017  . Anxiety   . Arthritis    "knees" (06/18/2015)  . Atrial fibrillation (HCC)    h/o sig bleed on coumadin  . Atrial fibrillation with RVR (HCC) 06/18/2015  . Cardiogenic shock (HCC) 05/02/2017  . CHF (congestive heart failure) (HCC)   . Complication of anesthesia 2010   "w/cardiac cath; got into a psychotic state for ~ 3 days; got me out w/Valium"  . Depression    "I have it off and on; not as often as I've gotten older" (06/18/2015)  . Diverticulosis    descending and sigmoid colon--Dr. Jarold Motto  . DJD (degenerative joint disease) of knee    left  . Dysrhythmia   . GERD (gastroesophageal reflux disease)   . Insomnia     off chronic ambien 5mg  as of 10/11, rare/episodic use since then  DCM/CHF  . Nonischemic cardiomyopathy (HCC)    normal coronaries, EF 15-20% 04/2008, EF 50-55% 2015  . Osteoporosis    on DXA 05/2010  . Sciatica    Right    Past Surgical History:  Procedure Laterality Date  . CARDIAC CATHETERIZATION  2010  . CARDIOVERSION N/A 06/19/2015   Procedure: CARDIOVERSION;  Surgeon: Laurey Morale, MD;  Location: [facility] ENDOSCOPY;  Service: Cardiovascular;  Laterality: N/A;  . CATARACT EXTRACTION W/ INTRAOCULAR LENS  IMPLANT, BILATERAL Bilateral   .  DILATION AND CURETTAGE OF UTERUS    . EYE SURGERY    . TEE WITHOUT CARDIOVERSION N/A 06/19/2015   Procedure: TRANSESOPHAGEAL ECHOCARDIOGRAM (TEE);  Surgeon: Laurey Morale, MD;  Location: [facility] ENDOSCOPY;  Service: Cardiovascular;  Laterality: N/A;  . TOTAL KNEE ARTHROPLASTY Left 04/25/2017   Procedure: TOTAL KNEE ARTHROPLASTY;  Surgeon: [name], MD;  Location: [facility] OR;  Service: Orthopedics;  Laterality: Left;  [name] VAGINAL HYSTERECTOMY  1979   ovaries intact     Current Outpatient Medications  Medication Sig Dispense Refill  . acetaminophen (TYLENOL) 500 MG tablet Take 500 mg by mouth 2 (two) times daily as needed.    [name] apixaban  (ELIQUIS) 2.5 MG TABS tablet Take 1 tablet (2.5 mg total) by mouth 2 (two) times daily. 60 tablet 6  . digoxin (LANOXIN) 0.25 MG tablet Take 0.5 tablets (0.125 mg total) by mouth daily. 90 tablet 2  . furosemide (LASIX) 40 MG tablet Take 1 tablet (40 mg total) by mouth 2 (two) times daily. TAKE SECOND dose of Lasix as needed 60 tablet 3  . metoprolol succinate (TOPROL-XL) 50 MG 24 hr tablet Take 1 tablet (50 mg total) by mouth daily. Take with or immediately following a meal. 90 tablet 3  . potassium chloride SA (K-DUR,KLOR-CON) 20 MEQ tablet Take 1 tablet (20 mEq total) by mouth 2 (two) times daily. 60 tablet 2  . ranitidine (ZANTAC) 150 MG tablet Take 150 mg by mouth daily.     . sertraline (ZOLOFT) 25 MG tablet Take 50 mg by mouth at bedtime.     . sertraline (ZOLOFT) 50 MG tablet Take 1 tablet (50 mg total) by mouth daily. (Patient taking differently: Take 50 mg by mouth 2 (two) times daily. )    . traMADol (ULTRAM) 50 MG tablet Take 50 mg by mouth every 6 (six) hours as needed.     No current facility-administered medications for this visit.     Allergies:   Ace inhibitors; Warfarin sodium; Codeine; Delsym [dextromethorphan polistirex er]; Lactose intolerance (gi); Phenylephrine; Sulfamethoxazole-trimethoprim; and Tramadol    Social History:  The patient  reports that she has never smoked. She has never used smokeless tobacco. She reports that she does not drink alcohol or use drugs.   Family History:  The patient's family history includes Cancer in her father; Colon cancer in her father; Tuberculosis in her mother.    ROS:  Please see the history of present illness.   Otherwise, review of systems are positive for none.   All other systems are reviewed and negative.    PHYSICAL EXAM: VS:  BP 104/64 (BP Location: Left Arm, Patient Position: Sitting, Cuff Size: Normal)   Pulse 88   Ht 5\' 5"  (1.651 m)   Wt 164 lb (74.4 kg)   BMI 27.29 kg/m  , BMI Body mass index is 27.29  kg/m. GENERAL:  Well appearing HEENT:  Pupils equal round and reactive, fundi not visualized, oral mucosa unremarkable NECK:  No jugular venous distention, waveform within normal limits, carotid upstroke brisk and symmetric, no bruits LUNGS:  Clear to auscultation bilaterally HEART:  Irregularly irregular.  PMI not displaced or sustained,S1 and S2 within normal limits, no S3, no S4, no clicks, no rubs, no murmurs ABD:  Flat, positive bowel sounds normal in frequency in pitch, no bruits, no rebound, no guarding, no midline pulsatile mass, no hepatomegaly, no splenomegaly EXT:  2 plus pulses throughout, no edema, no cyanosis no clubbing SKIN:  No rashes no nodules NEURO:  Cranial nerves II through XII grossly intact, motor grossly intact throughout PSYCH:  Cognitively intact, oriented to person place and time   EKG:  EKG is not ordered today.  Echo 04/30/17: Study Conclusions  - Left ventricle: The cavity size was moderately dilated. Wall   thickness was normal. Systolic function was severely reduced. The   estimated ejection fraction was 15%. No mural thrombus with   Definity contrast. Diffuse hypokinesis. The study is not   technically sufficient to allow evaluation of LV diastolic   function. - Aortic valve: Mildly calcified leaflets. There was no stenosis.   There was no regurgitation. - Mitral valve: Mildly thickened leaflets . There was moderate   regurgitation. - Left atrium: Severely dilated. - Right ventricle: The cavity size was mildly dilated. Mildly   reduced systolic function. - Right atrium: Severely dilated. - Tricuspid valve: There was moderate to severe regurgitation. - Pulmonary arteries: PA peak pressure: 45 mm Hg (S). - Inferior vena cava: The vessel was dilated. The respirophasic   diameter changes were blunted (< 50%), consistent with elevated   central venous pressure. - Pericardium, extracardiac: There was a left pleural effusion.   Recent  Labs: 04/27/2017: Magnesium 2.1 05/01/2017: B Natriuretic Peptide 4,284.7; TSH 2.685 05/18/2017: ALT 24; Hemoglobin 14.3; NT-Pro BNP 6,071; Platelets 448 07/24/2017: BUN 14; Creatinine, Ser 0.74; Potassium 4.3; Sodium 138    Lipid Panel    Component Value Date/Time   CHOL 209 (H) 01/30/2017 1451   TRIG 151.0 (H) 01/30/2017 1451   HDL 55.50 01/30/2017 1451   CHOLHDL 4 01/30/2017 1451   VLDL 30.2 01/30/2017 1451   LDLCALC 124 (H) 01/30/2017 1451   LDLDIRECT 56.0 10/17/2014 0836      Wt Readings from Last 3 Encounters:  07/21/17 164 lb (74.4 kg)  06/02/17 160 lb (72.6 kg)  05/18/17 157 lb (71.2 kg)      ASSESSMENT AND PLAN:  # Chronic systolic and diastolic heart failure: LVEF 15%.  She is dry to euvolemic and has been dizzy.  She will weigh daily and take an extra dose of lasix if her weight increases >160 lb.  Otherwise she will take lasix 40mg  daily.  Continue metoprolol and digoxin.  Check digoxin level on 4/15.  Stopping losartan due to low BP.  Repeat echo.   # Hypertension:  # Dizziness:  BP has been running low.  She has been dizzy and low BP may be contributing.  Stop losartan.    # Chronic atrial fibrillation: Continue Eliquis, metoprolol, and digoxin.  Rate well-controlled.  Current medicines are reviewed at length with the patient today.  The patient has concerns regarding medicines.  The following changes have been made:  Stop losartan  Labs/ tests ordered today include:   Orders Placed This Encounter  Procedures  . Basic metabolic panel  . Digoxin level  . ECHOCARDIOGRAM COMPLETE     Disposition:   FU with Wynema Garoutte C. Duke Salvia, MD, [facility] in 2 months.    This note was written with the assistance of speech recognition software.  Please excuse any transcriptional errors.  Signed, Pearson Picou C. Duke Salvia, MD, [facility]  07/29/2017 2:23 PM    [facility]

## 2017-07-24 LAB — BASIC METABOLIC PANEL
BUN/Creatinine Ratio: 19 (ref 12–28)
BUN: 14 mg/dL (ref 8–27)
CO2: 25 mmol/L (ref 20–29)
Calcium: 9.5 mg/dL (ref 8.7–10.3)
Chloride: 99 mmol/L (ref 96–106)
Creatinine, Ser: 0.74 mg/dL (ref 0.57–1.00)
GFR calc Af Amer: 86 mL/min/{1.73_m2} (ref >59)
GFR calc non Af Amer: 75 mL/min/{1.73_m2} (ref >59)
Glucose: 88 mg/dL (ref 65–99)
Potassium: 4.3 mmol/L (ref 3.5–5.2)
Sodium: 138 mmol/L (ref 134–144)

## 2017-07-24 LAB — DIGOXIN LEVEL: Digoxin, Serum: 0.8 ng/mL (ref 0.5–0.9)

## 2017-07-29 ENCOUNTER — Encounter: Payer: Self-pay | Admitting: Cardiovascular Disease

## 2017-08-03 ENCOUNTER — Other Ambulatory Visit: Payer: Self-pay

## 2017-08-03 ENCOUNTER — Other Ambulatory Visit: Payer: Self-pay | Admitting: Cardiovascular Disease

## 2017-08-03 ENCOUNTER — Ambulatory Visit (HOSPITAL_COMMUNITY): Payer: Medicare Other | Attending: Cardiology

## 2017-08-03 DIAGNOSIS — I5042 Chronic combined systolic (congestive) and diastolic (congestive) heart failure: Secondary | ICD-10-CM | POA: Insufficient documentation

## 2017-08-03 DIAGNOSIS — I272 Pulmonary hypertension, unspecified: Secondary | ICD-10-CM | POA: Diagnosis not present

## 2017-08-03 NOTE — Telephone Encounter (Signed)
Please review for refill, thanks ! 

## 2017-08-04 NOTE — Telephone Encounter (Signed)
Needs to verify reason for low dose Eliquis

## 2017-08-07 NOTE — Telephone Encounter (Signed)
Waiting for Dr Duke Salvia assessment

## 2017-08-08 NOTE — Telephone Encounter (Signed)
Dose okay by Dr Duke Salvia due to history of bleeding

## 2017-08-22 ENCOUNTER — Ambulatory Visit: Payer: Medicare Other | Admitting: Family Medicine

## 2017-08-22 ENCOUNTER — Encounter: Payer: Self-pay | Admitting: Family Medicine

## 2017-08-22 VITALS — BP 104/64 | HR 80 | Temp 97.6°F | Ht 64.75 in | Wt 161.5 lb

## 2017-08-22 DIAGNOSIS — R197 Diarrhea, unspecified: Secondary | ICD-10-CM | POA: Diagnosis not present

## 2017-08-22 DIAGNOSIS — F339 Major depressive disorder, recurrent, unspecified: Secondary | ICD-10-CM | POA: Diagnosis not present

## 2017-08-22 LAB — COMPREHENSIVE METABOLIC PANEL
ALT: 6 U/L (ref 0–35)
AST: 16 U/L (ref 0–37)
Albumin: 3.7 g/dL (ref 3.5–5.2)
Alkaline Phosphatase: 89 U/L (ref 39–117)
BUN: 14 mg/dL (ref 6–23)
CO2: 32 mEq/L (ref 19–32)
Calcium: 9.4 mg/dL (ref 8.4–10.5)
Chloride: 99 mEq/L (ref 96–112)
Creatinine, Ser: 0.95 mg/dL (ref 0.40–1.20)
GFR: 59.49 mL/min — ABNORMAL LOW (ref >60.00)
Glucose, Bld: 96 mg/dL (ref 70–99)
Potassium: 4.1 mEq/L (ref 3.5–5.1)
Sodium: 138 mEq/L (ref 135–145)
Total Bilirubin: 0.7 mg/dL (ref 0.2–1.2)
Total Protein: 6.9 g/dL (ref 6.0–8.3)

## 2017-08-22 LAB — CBC WITH DIFFERENTIAL/PLATELET
Basophils Absolute: 0.1 10*3/uL (ref 0.0–0.1)
Basophils Relative: 0.7 % (ref 0.0–3.0)
Eosinophils Absolute: 0.2 10*3/uL (ref 0.0–0.7)
Eosinophils Relative: 3.5 % (ref 0.0–5.0)
HCT: 40.2 % (ref 36.0–46.0)
Hemoglobin: 13.4 g/dL (ref 12.0–15.0)
Lymphocytes Relative: 26.5 % (ref 12.0–46.0)
Lymphs Abs: 1.9 10*3/uL (ref 0.7–4.0)
MCHC: 33.4 g/dL (ref 30.0–36.0)
MCV: 92.6 fl (ref 78.0–100.0)
Monocytes Absolute: 0.7 10*3/uL (ref 0.1–1.0)
Monocytes Relative: 10.2 % (ref 3.0–12.0)
Neutro Abs: 4.2 10*3/uL (ref 1.4–7.7)
Neutrophils Relative %: 59.1 % (ref 43.0–77.0)
Platelets: 292 10*3/uL (ref 150.0–400.0)
RBC: 4.34 Mil/uL (ref 3.87–5.11)
RDW: 13.4 % (ref 11.5–15.5)
WBC: 7 10*3/uL (ref 4.0–10.5)

## 2017-08-22 MED ORDER — SERTRALINE HCL 50 MG PO TABS: 50.0000 mg | ORAL_TABLET | Freq: Two times a day (BID) | ORAL | Status: DC

## 2017-08-22 NOTE — Patient Instructions (Signed)
Don't change your meds for now.  Take care.  Glad to see you.  Go to the lab on the way out.  We'll contact you with your lab report. 

## 2017-08-22 NOTE — Progress Notes (Signed)
She is walking with a cane for extra stability when she goes out. She hasn't fallen. Cautions d/w pt.    She has frequent and sig diarrhea that gets better temporarily with imodium.  She has fecal urgency with occ accidents. It has gotten worse in the last few months.  She has some episodic abd cramping.  She has more urgency after eating.  No blood in stool. No black stools but stools are darker.  No vomiting. No fevers.  No mucous seen in stools.  She is on well water with a filter.  She has sx that are going on no matter what she eats, but worse with higher sugar intake.    She didn't think she was much effect from sertraline at 100mg  a day.  No SI/HI.    She had a presumed light case of shingles with itchy rash with some lesions on the L shoulder.  Sig better in the meantime.  Prev vaccination noted.    PMH and SH reviewed  ROS: Per HPI unless specifically indicated in ROS section   Meds, vitals, and allergies reviewed.   GEN: nad, alert and oriented HEENT: mucous membranes moist NECK: supple w/o LA CV: sounds to be RRR PULM: ctab, no inc wob ABD: soft, +bs, not ttp EXT: no edema SKIN: no acute rash

## 2017-08-23 ENCOUNTER — Other Ambulatory Visit: Payer: Medicare Other

## 2017-08-23 ENCOUNTER — Encounter: Payer: Self-pay | Admitting: Family Medicine

## 2017-08-23 DIAGNOSIS — R197 Diarrhea, unspecified: Secondary | ICD-10-CM

## 2017-08-23 NOTE — Assessment & Plan Note (Signed)
Unclear source.  Check routine labs today.  She will drop off a stool sample for IFOB and routine pathogens.  We may end up needing to refer to GI.  I want to get her labs back before we change any of her medications.  Supportive care in the meantime.  Nontoxic.  Okay for outpatient follow-up.  She agrees.

## 2017-08-23 NOTE — Assessment & Plan Note (Signed)
We talked about depression in general.  We made up needing to change her sertraline dose but I wanted to address the diarrhea first.  She agrees with that.

## 2017-08-24 ENCOUNTER — Encounter: Payer: Self-pay | Admitting: Family Medicine

## 2017-08-24 LAB — GASTROINTESTINAL PATHOGEN PANEL PCR
C. difficile Tox A/B, PCR: NOT DETECTED
Campylobacter, PCR: NOT DETECTED
Cryptosporidium, PCR: NOT DETECTED
E coli (ETEC) LT/ST PCR: NOT DETECTED
E coli (STEC) stx1/stx2, PCR: NOT DETECTED
E coli 0157, PCR: NOT DETECTED
Giardia lamblia, PCR: NOT DETECTED
Norovirus, PCR: NOT DETECTED
Rotavirus A, PCR: NOT DETECTED
Salmonella, PCR: NOT DETECTED
Shigella, PCR: NOT DETECTED

## 2017-08-28 ENCOUNTER — Other Ambulatory Visit: Payer: Self-pay | Admitting: Family Medicine

## 2017-08-28 DIAGNOSIS — R197 Diarrhea, unspecified: Secondary | ICD-10-CM

## 2017-08-30 ENCOUNTER — Encounter: Payer: Self-pay | Admitting: Family Medicine

## 2017-08-30 ENCOUNTER — Other Ambulatory Visit (INDEPENDENT_AMBULATORY_CARE_PROVIDER_SITE_OTHER): Payer: Medicare Other

## 2017-08-30 DIAGNOSIS — R197 Diarrhea, unspecified: Secondary | ICD-10-CM | POA: Diagnosis not present

## 2017-08-30 LAB — FECAL OCCULT BLOOD, IMMUNOCHEMICAL: Fecal Occult Bld: NEGATIVE

## 2017-09-02 ENCOUNTER — Encounter: Payer: Self-pay | Admitting: Family Medicine

## 2017-09-05 ENCOUNTER — Telehealth: Payer: Self-pay | Admitting: Family Medicine

## 2017-09-05 MED ORDER — DIPHENOXYLATE-ATROPINE 2.5-0.025 MG PO TABS: 1.0000 | ORAL_TABLET | Freq: Two times a day (BID) | ORAL | 0 refills | Status: DC | PRN

## 2017-09-05 NOTE — Telephone Encounter (Signed)
Called and spoke to patient she states he has been taking Tums and imodium and it helps for a day. She states that she has an appointment Thursday with GI. Will pick up prescription and try it in the meantime. Nothing further needed.

## 2017-09-05 NOTE — Telephone Encounter (Signed)
Call pt.  GI should be calling her about moving her appointment up.  Okay to try lomotil prn in the meantime to see if that helps with diarrhea.  rx sent.  Thanks.

## 2017-09-07 ENCOUNTER — Telehealth: Payer: Self-pay | Admitting: *Deleted

## 2017-09-07 ENCOUNTER — Encounter: Payer: Self-pay | Admitting: Family Medicine

## 2017-09-07 ENCOUNTER — Encounter: Payer: Self-pay | Admitting: Gastroenterology

## 2017-09-07 ENCOUNTER — Encounter: Payer: Self-pay | Admitting: Cardiovascular Disease

## 2017-09-07 ENCOUNTER — Ambulatory Visit: Payer: Medicare Other | Admitting: Gastroenterology

## 2017-09-07 VITALS — BP 102/64 | HR 80 | Ht 65.0 in | Wt 159.4 lb

## 2017-09-07 DIAGNOSIS — K529 Noninfective gastroenteritis and colitis, unspecified: Secondary | ICD-10-CM

## 2017-09-07 DIAGNOSIS — R490 Dysphonia: Secondary | ICD-10-CM | POA: Diagnosis not present

## 2017-09-07 NOTE — Progress Notes (Addendum)
09/07/2017 [name] [id number] [birth year]   HISTORY OF PRESENT ILLNESS:  This is a pleasant 84 year old female who is a patient of Dr. Marvell Fuller.  She has chronic diarrhea and long-standing diagnosis of IBS, maybe some lactose intolerance as well.  Seems to have acutely worsened over the past few months since being on new medications for atrial fibrillation and CHF (in January).  Is on digoxin.  Was prescribed Lomotil by her PCP, but has not yet picked that up at the pharmacy.  Did have Cdiff last year, but recent stool GI pathogen panel negative.  Tells me that she has diarrhea mostly after eating.  Has urgency with one episode of incontinence.  Very gassy.  No abdominal pain excpet some mild cramping associated with the diarrhea.    Her last colonoscopy was in March 2006 at which time she was found to have only diverticulosis; this was performed by Dr. Jarold Motto.  While she is here she also mentions briefly that she's had a lot of hoarseness.  Has zantac at home but does not take it regularly as she does not feel that she has a lot of heartburn/reflux symptoms.     Past Medical History:  Diagnosis Date  . Acute combined systolic and diastolic heart failure (HCC) 05/02/2017  . Anxiety   . Arthritis    "knees" (06/18/2015)  . Atrial fibrillation (HCC)    h/o sig bleed on coumadin  . Atrial fibrillation with RVR (HCC) 06/18/2015  . Cardiogenic shock (HCC) 05/02/2017  . CHF (congestive heart failure) (HCC)   . Complication of anesthesia 2010   "w/cardiac cath; got into a psychotic state for ~ 3 days; got me out w/Valium"  . Depression    "I have it off and on; not as often as I've gotten older" (06/18/2015)  . Diverticulosis    descending and sigmoid colon--Dr. Jarold Motto  . DJD (degenerative joint disease) of knee    left  . Dysrhythmia   . GERD (gastroesophageal reflux disease)   . Insomnia     off chronic ambien 5mg  as of 10/11, rare/episodic use since then  DCM/CHF  .  Nonischemic cardiomyopathy (HCC)    normal coronaries, EF 15-20% 04/2008, EF 50-55% 2015  . Osteoporosis    on DXA 05/2010  . Sciatica    Right   Past Surgical History:  Procedure Laterality Date  . CARDIAC CATHETERIZATION  2010  . CARDIOVERSION N/A 06/19/2015   Procedure: CARDIOVERSION;  Surgeon: Laurey Morale, MD;  Location: [facility] ENDOSCOPY;  Service: Cardiovascular;  Laterality: N/A;  . CATARACT EXTRACTION W/ INTRAOCULAR LENS  IMPLANT, BILATERAL Bilateral   . DILATION AND CURETTAGE OF UTERUS    . EYE SURGERY    . TEE WITHOUT CARDIOVERSION N/A 06/19/2015   Procedure: TRANSESOPHAGEAL ECHOCARDIOGRAM (TEE);  Surgeon: Laurey Morale, MD;  Location: [facility] ENDOSCOPY;  Service: Cardiovascular;  Laterality: N/A;  . TOTAL KNEE ARTHROPLASTY Left 04/25/2017   Procedure: TOTAL KNEE ARTHROPLASTY;  Surgeon: [name], MD;  Location: [facility] OR;  Service: Orthopedics;  Laterality: Left;  [name] VAGINAL HYSTERECTOMY  1979   ovaries intact    reports that she has never smoked. She has never used smokeless tobacco. She reports that she does not drink alcohol or use drugs. family history includes Cancer in her father; Colon cancer in her father; Tuberculosis in her mother. Allergies  Allergen Reactions  . Ace Inhibitors Cough  . Warfarin Sodium Other (See Comments)    DOSE RELATED PHARMACOLOGIC EFFECT "  bleed out"  . Codeine Other (See Comments)    sedation  . Delsym [Dextromethorphan Polistirex Er] Other (See Comments)    dizziness  . Lactose Intolerance (Gi) Diarrhea  . Phenylephrine Palpitations and Other (See Comments)    Nasal spray- "likely increase in nasal congestion".   . Sulfamethoxazole-Trimethoprim Nausea And Vomiting    GI intolerance.      Outpatient Encounter Medications as of 09/07/2017  Medication Sig  . acetaminophen (TYLENOL) 500 MG tablet Take 500 mg by mouth 2 (two) times daily as needed.  . digoxin (LANOXIN) 0.25 MG tablet Take 0.5 tablets (0.125 mg total) by mouth daily.  [name]  ELIQUIS 2.5 MG TABS tablet TAKE 1 TABLET BY MOUTH TWICE A DAY  . furosemide (LASIX) 40 MG tablet Take 1 tablet (40 mg total) by mouth 2 (two) times daily. TAKE SECOND dose of Lasix as needed  . KLOR-CON M20 20 MEQ tablet TAKE 1 TABLET BY MOUTH TWICE A DAY  . Loperamide HCl (IMODIUM PO) Take by mouth as needed.  . metoprolol succinate (TOPROL-XL) 50 MG 24 hr tablet Take 1 tablet (50 mg total) by mouth daily. Take with or immediately following a meal.  . ranitidine (ZANTAC) 150 MG tablet Take 150 mg by mouth daily.   . sertraline (ZOLOFT) 50 MG tablet Take 1 tablet (50 mg total) by mouth 2 (two) times daily.  . traMADol (ULTRAM) 50 MG tablet Take 50 mg by mouth every 6 (six) hours as needed.  . diphenoxylate-atropine (LOMOTIL) 2.5-0.025 MG tablet Take 1 tablet by mouth 2 (two) times daily as needed for diarrhea or loose stools. (Patient not taking: Reported on 09/07/2017)   No facility-administered encounter medications on file as of 09/07/2017.      REVIEW OF SYSTEMS  : All other systems reviewed and negative except where noted in the History of Present Illness.   PHYSICAL EXAM: BP 102/64   Pulse 80   Ht 5\' 5"  (1.651 m)   Wt 159 lb 6.4 oz (72.3 kg)   BMI 26.53 kg/m  General: Well developed white female in no acute distress Head: Normocephalic and atraumatic Eyes:  Sclerae anicteric, conjunctiva pink. Ears: Normal auditory acuity Lungs: Clear throughout to auscultation; no increased WOB. Heart: Irregularly irregular. Abdomen: Soft, non-distended.  BS present.  Non-tender. Musculoskeletal: Symmetrical with no gross deformities  Skin: No lesions on visible extremities Extremities: No edema  Neurological: Alert oriented x 4, grossly nonfocal Psychological:  Alert and cooperative. Normal mood and affect  ASSESSMENT AND PLAN: *Acute on chronic diarrhea:  Had long discussion with the patient and her husband today.  Has chronic diarrhea and long-standing diagnosis of IBS, maybe some  lactose intolerance as well.  Seems to have acutely worsened over the past few months since being on new medications for atrial fibrillation and CHF.  Is on digoxin, which can cause diarrhea; that is really the only med that I see that stands out as a possible culprit.  Has not yet picked up the lomotil.  She is going to discuss the digoxin with her cardiologist.  Will try the lomotil in the interim.  Will keep [place] updated.  We discussed possibility of colonoscopy to rule out microscopic colitis if needed but she prefers to avoid that if possible and certainly would not be ideal at her age, with her low EF, and being on Eliquis.  Recent stool GI pathogen panel negative. *Hoarseness:  We discussed this briefly today.  She has zantac at home but does not take  it regularly.  Would consider trial or daily zantac or even daily PPI for 4-6 weeks.   CC:  [name], MD  Agree with Ms. Rise Mu management. She is off digoxin and improved Iva Boop, MD, [facility]

## 2017-09-07 NOTE — Telephone Encounter (Signed)
Received PA request for Lomotil.  PA completed on CoverMyMeds. Sent for review. Can take up to 72 hours for decision.

## 2017-09-07 NOTE — Patient Instructions (Signed)
You may use Imodium or Lomotil as needed  Discuss your digoxin with cardiologist  Send a mychart message with an update

## 2017-09-08 NOTE — Telephone Encounter (Signed)
[id number].  DIPHEN/ATROP TAB 2.5MG  is approved through 04/10/2018

## 2017-09-10 NOTE — Telephone Encounter (Signed)
Noted. Thanks.

## 2017-09-14 ENCOUNTER — Encounter: Payer: Self-pay | Admitting: Family Medicine

## 2017-09-20 ENCOUNTER — Ambulatory Visit: Payer: Medicare Other | Admitting: Gastroenterology

## 2017-09-26 ENCOUNTER — Encounter: Payer: Self-pay | Admitting: Gastroenterology

## 2017-09-27 ENCOUNTER — Other Ambulatory Visit: Payer: Self-pay | Admitting: Family Medicine

## 2017-09-27 ENCOUNTER — Encounter: Payer: Self-pay | Admitting: Cardiovascular Disease

## 2017-09-28 NOTE — Telephone Encounter (Signed)
Electronic refill request. Lomotil Last office visit:   08/22/17 Last Filled:    30 tablet 0 09/05/2017  Please advise.

## 2017-09-29 ENCOUNTER — Encounter: Payer: Self-pay | Admitting: Family Medicine

## 2017-09-29 NOTE — Telephone Encounter (Signed)
Sent. Thanks.   

## 2017-10-04 ENCOUNTER — Ambulatory Visit: Payer: Medicare Other | Admitting: Cardiovascular Disease

## 2017-10-06 ENCOUNTER — Encounter: Payer: Self-pay | Admitting: Cardiovascular Disease

## 2017-10-06 ENCOUNTER — Ambulatory Visit: Payer: Medicare Other | Admitting: Cardiovascular Disease

## 2017-10-06 VITALS — BP 112/81 | HR 103 | Ht 65.0 in | Wt 160.0 lb

## 2017-10-06 DIAGNOSIS — I10 Essential (primary) hypertension: Secondary | ICD-10-CM | POA: Diagnosis not present

## 2017-10-06 DIAGNOSIS — I5042 Chronic combined systolic (congestive) and diastolic (congestive) heart failure: Secondary | ICD-10-CM | POA: Diagnosis not present

## 2017-10-06 DIAGNOSIS — Z7901 Long term (current) use of anticoagulants: Secondary | ICD-10-CM | POA: Diagnosis not present

## 2017-10-06 DIAGNOSIS — I4891 Unspecified atrial fibrillation: Secondary | ICD-10-CM

## 2017-10-06 MED ORDER — DIGOXIN 250 MCG PO TABS: 0.1250 mg | ORAL_TABLET | Freq: Every day | ORAL | 3 refills | Status: DC

## 2017-10-06 NOTE — Progress Notes (Signed)
Cardiology Office Note   Date:  10/06/2017   ID:  [name], DOB 09-08-1930, MRN [id number]  PCP:  Catherine Nam, MD  Cardiologist:   Catherine Si, MD   Chief Complaint  Patient presents with  . Follow-up     History of Present Illness: [name] is a 84 y.o. female with chronic atrial fibrillation, chronic systolic and diastolic heart failure, left bundle branch block, spontaneous retroperitoneal bleed on Coumadin who presents for follow-up.  She is previously seen by Dr. Barrington Eaton clinic 03/2017 and cleared for surgery.  At that time she was in atrial fibrillation with well-controlled rates.  She underwent left knee replacement 04/2017.  Her hospital course was complicated by atrial fibrillation with rapid ventricular response and acute on chronic systolic and diastolic heart failure.  Echo that admission revealed LVEF 15% which was down from 50% in 2017.  She was diuresed with IV Lasix and had a Lexiscan Myoview without evidence of ischemia.  She failed amiodarone and was started on digoxin.  She had a repeat echocardiogram 07/2017 that revealed LVEF 20 to 25%.  [name] followed up with Catherine Gavia, PA, ad was doing well on 05/2017.  She was struggling with anxiety but asymptomatic from a cardiac standpoint.  She then followed up with Catherine Demark, PA-C on 06/02/17 and reported episodes of feeling fluttering in her chest.  She also noted that her appetite was poor and that she had not been very active.  She was thought to be dry and Lasix was switched to once a day.  At her last appointment losartan was discontinued due to low BP and fatigue.  Her fatigue has improved.  She also struggled with diarrhea and stopped taking her digoxin in hopes that this would help.  However the diarrhea did not improve.  She brings records of her blood pressure and heart rate showing that her blood pressure has been well-controlled.  However since making that change in early April her heart  rate has been in the 100s to 110s.  This has made her feel shaky and more fatigued.  She does not get much formal exercise.  Her right arm has been hurting and she wonders if she will be okay to have steroid injections.  She also struggles with R knee pain.  She denies any lower extremity edema, orthopnea, or PND.   Past Medical History:  Diagnosis Date  . Acute combined systolic and diastolic heart failure (HCC) 05/02/2017  . Anxiety   . Arthritis    "knees" (06/18/2015)  . Atrial fibrillation (HCC)    h/o sig bleed on coumadin  . Atrial fibrillation with RVR (HCC) 06/18/2015  . Cardiogenic shock (HCC) 05/02/2017  . CHF (congestive heart failure) (HCC)   . Complication of anesthesia 2010   "w/cardiac cath; got into a psychotic state for ~ 3 days; got me out w/Valium"  . Depression    "I have it off and on; not as often as I've gotten older" (06/18/2015)  . Diverticulosis    descending and sigmoid colon--[name]  . DJD (degenerative joint disease) of knee    left  . Dysrhythmia   . GERD (gastroesophageal reflux disease)   . Insomnia     off chronic ambien 5mg  as of 10/11, rare/episodic use since then  DCM/CHF  . Nonischemic cardiomyopathy (HCC)    normal coronaries, EF 15-20% 04/2008, EF 50-55% 2015  . Osteoporosis    on DXA 05/2010  . Sciatica  Right    Past Surgical History:  Procedure Laterality Date  . CARDIAC CATHETERIZATION  2010  . CARDIOVERSION N/A 06/19/2015   Procedure: CARDIOVERSION;  Surgeon: Catherine Morale, MD;  Location: [facility] ENDOSCOPY;  Service: Cardiovascular;  Laterality: N/A;  . CATARACT EXTRACTION W/ INTRAOCULAR LENS  IMPLANT, BILATERAL Bilateral   . DILATION AND CURETTAGE OF UTERUS    . EYE SURGERY    . TEE WITHOUT CARDIOVERSION N/A 06/19/2015   Procedure: TRANSESOPHAGEAL ECHOCARDIOGRAM (TEE);  Surgeon: Catherine Morale, MD;  Location[facility] ENDOSCOPY;  Service: Cardiovascular;  Laterality: N/A;  . TOTAL KNEE ARTHROPLASTY Left 04/25/2017   Procedure: TOTAL KNEE  ARTHROPLASTY;  Surgeon: Catherine Corning, MD;  Location: [facility] OR;  Service: Orthopedics;  Laterality: Left;  [name] VAGINAL HYSTERECTOMY  1979   ovaries intact     Current Outpatient Medications  Medication Sig Dispense Refill  . acetaminophen (TYLENOL) 500 MG tablet Take 500 mg by mouth 2 (two) times daily as needed.    . digoxin (LANOXIN) 0.25 MG tablet Take 0.5 tablets (0.125 mg total) by mouth daily. 45 tablet 3  . ELIQUIS 2.5 MG TABS tablet TAKE 1 TABLET BY MOUTH TWICE A DAY 60 tablet 1  . furosemide (LASIX) 40 MG tablet Take 1 tablet (40 mg total) by mouth 2 (two) times daily. TAKE SECOND dose of Lasix as needed 60 tablet 3  . KLOR-CON M20 20 MEQ tablet TAKE 1 TABLET BY MOUTH TWICE A DAY 60 tablet 3  . metoprolol succinate (TOPROL-XL) 50 MG 24 hr tablet Take 1 tablet (50 mg total) by mouth daily. Take with or immediately following a meal. 90 tablet 3  . ranitidine (ZANTAC) 150 MG tablet Take 150 mg by mouth daily.     [name] tiZANidine (ZANAFLEX) 2 MG tablet Take by mouth as needed for muscle spasms.    . traMADol (ULTRAM) 50 MG tablet Take 50 mg by mouth every 6 (six) hours as needed.     No current facility-administered medications for this visit.     Allergies:   Ace inhibitors; Warfarin sodium; Codeine; Delsym [dextromethorphan polistirex er]; Lactose intolerance (gi); Phenylephrine; and Sulfamethoxazole-trimethoprim    Social History:  The patient  reports that she has never smoked. She has never used smokeless tobacco. She reports that she does not drink alcohol or use drugs.   Family History:  The patient's family history includes Cancer in her father; Colon cancer in her father; Tuberculosis in her mother.    ROS:  Please see the history of present illness.   Otherwise, review of systems are positive for none.   All other systems are reviewed and negative.    PHYSICAL EXAM: VS:  BP 112/81   Pulse (!) 103   Ht 5\' 5"  (1.651 m)   Wt 160 lb (72.6 kg)   BMI 26.63 kg/m  , BMI Body  mass index is 26.63 kg/m. GENERAL:  Well appearing HEENT: Pupils equal round and reactive, fundi not visualized, oral mucosa unremarkable NECK:  No jugular venous distention, waveform within normal limits, carotid upstroke brisk and symmetric, no bruits, no thyromegaly LYMPHATICS:  No cervical adenopathy LUNGS:  Clear to auscultation bilaterally HEART:  Tachycardic.  Irregularly irregular.  PMI not displaced or sustained,S1 and S2 within normal limits, no S3, no S4, no clicks, no rubs, no murmurs ABD:  Flat, positive bowel sounds normal in frequency in pitch, no bruits, no rebound, no guarding, no midline pulsatile mass, no hepatomegaly, no splenomegaly EXT:  2 plus pulses throughout, no  edema, no cyanosis no clubbing SKIN:  No rashes no nodules NEURO:  Cranial nerves II through XII grossly intact, motor grossly intact throughout PSYCH:  Cognitively intact, oriented to person place and time    EKG:  EKG is not ordered today.  Echo 04/30/17: Study Conclusions  - Left ventricle: The cavity size was moderately dilated. Wall   thickness was normal. Systolic function was severely reduced. The   estimated ejection fraction was 15%. No mural thrombus with   Definity contrast. Diffuse hypokinesis. The study is not   technically sufficient to allow evaluation of LV diastolic   function. - Aortic valve: Mildly calcified leaflets. There was no stenosis.   There was no regurgitation. - Mitral valve: Mildly thickened leaflets . There was moderate   regurgitation. - Left atrium: Severely dilated. - Right ventricle: The cavity size was mildly dilated. Mildly   reduced systolic function. - Right atrium: Severely dilated. - Tricuspid valve: There was moderate to severe regurgitation. - Pulmonary arteries: PA peak pressure: 45 mm Hg (S). - Inferior vena cava: The vessel was dilated. The respirophasic   diameter changes were blunted (< 50%), consistent with elevated   central venous pressure. -  Pericardium, extracardiac: There was a left pleural effusion.  Echo 08/03/17: Study Conclusions  - Left ventricle: The cavity size was normal. Wall thickness was   increased in a pattern of mild LVH. Systolic function was   severely reduced. The estimated ejection fraction was in the   range of 20% to 25%. Diffuse hypokinesis. There is dyskinesis of   the anteroseptal myocardium. The study is not technically   sufficient to allow evaluation of LV diastolic function. - Aortic root: The aortic root was normal in size. - Mitral valve: Calcified annulus. - Left atrium: The atrium was moderately dilated. - Pulmonary arteries: Systolic pressure was mildly to moderately   increased.  Impressions:  - Diffuse hypokinesis with septal dyskinesis; overall severely   reduced LV systolic function; mild LVH; moderate LAE; mild TR;   mild to moderate pulmonary hypertension.   Recent Labs: 04/27/2017: Magnesium 2.1 05/01/2017: B Natriuretic Peptide 4,284.7; TSH 2.685 05/18/2017: NT-Pro BNP 6,071 08/22/2017: ALT 6; BUN 14; Creatinine, Ser 0.95; Hemoglobin 13.4; Platelets 292.0; Potassium 4.1; Sodium 138    Lipid Panel    Component Value Date/Time   CHOL 209 (H) 01/30/2017 1451   TRIG 151.0 (H) 01/30/2017 1451   HDL 55.50 01/30/2017 1451   CHOLHDL 4 01/30/2017 1451   VLDL 30.2 01/30/2017 1451   LDLCALC 124 (H) 01/30/2017 1451   LDLDIRECT 56.0 10/17/2014 0836      Wt Readings from Last 3 Encounters:  10/06/17 160 lb (72.6 kg)  09/07/17 159 lb 6.4 oz (72.3 kg)  08/22/17 161 lb 8 oz (73.3 kg)      ASSESSMENT AND PLAN:  # Chronic systolic and diastolic heart failure: LVEF 15% improved to 20-25%.  She is not on an ACE-I/ARB due to hypotension.  She is euvolemic.  Continue metoprolol and lasix.  She will resume digoxin.  We discussed ICD and she is unsure of whether she would want this.  She was provided with information and will take some time to think about it.    # Hypertension:  #  Dizziness:  Improved.  Continue metoprolol.  # Chronic atrial fibrillation: Continue Eliquis and metoprolol.  Resume digoxin for improved rate control.  OK to hold Eliquis for injection.    Current medicines are reviewed at length with the patient today.  The patient has concerns regarding medicines.  The following changes have been made:  None  Time spent: 45 minutes-Greater than 50% of this time was spent in counseling, explanation of diagnosis, planning of further management, and coordination of care.   Labs/ tests ordered today include:   No orders of the defined types were placed in this encounter.    Disposition:   FU with Pressley Barsky C. Duke Salvia, MD, [facility] in 4 months     Signed, Alandra Sando C. Duke Salvia, MD, [facility]  10/06/2017 11:47 AM    [facility]

## 2017-10-06 NOTE — Patient Instructions (Addendum)
Medication Instructions:  RESTART digoxin 0.125 mg (1/2 tablet) daily  Follow-Up: 4 months with Dr. Duke Salvia   Any Other Special Instructions Will Be Listed Below (If Applicable).   Cardioverter Defibrillator Implantation An implantable cardioverter defibrillator (ICD) is a small device that is placed under the skin in the chest or abdomen. An ICD consists of a battery, a small computer (pulse generator), and wires (leads) that go into the heart. An ICD is used to detect and correct two types of dangerous irregular heartbeats (arrhythmias):  A rapid heart rhythm (tachycardia).  An arrhythmia in which the lower chambers of the heart (ventricles) contract in an uncoordinated way (fibrillation).  When an ICD detects tachycardia, it sends a low-energy shock to the heart to restore the heartbeat to normal (cardioversion). This signal is usually painless. If cardioversion does not work or if the ICD detects fibrillation, it delivers a high-energy shock to the heart (defibrillation) to restart the heart. This shock may feel like a strong jolt in the chest. Your health care provider may prescribe an ICD if:  You have had an arrhythmia that originated in the ventricles.  Your heart has been damaged by a disease or heart condition.  Sometimes, ICDs are programmed to act as a device called a pacemaker. Pacemakers can be used to treat a slow heartbeat (bradycardia) or tachycardia by taking over the heart rate with electrical impulses. Tell a health care provider about:  Any allergies you have.  All medicines you are taking, including vitamins, herbs, eye drops, creams, and over-the-counter medicines.  Any problems you or family members have had with anesthetic medicines.  Any blood disorders you have.  Any surgeries you have had.  Any medical conditions you have.  Whether you are pregnant or may be pregnant. What are the risks? Generally, this is a safe procedure. However, problems may  occur, including:  Swelling, bleeding, or bruising.  Infection.  Blood clots.  Damage to other structures or organs, such as nerves, blood vessels, or the heart.  Allergic reactions to medicines used during the procedure.  What happens before the procedure? Staying hydrated Follow instructions from your health care provider about hydration, which may include:  Up to 2 hours before the procedure - you may continue to drink clear liquids, such as water, clear fruit juice, black coffee, and plain tea.  Eating and drinking restrictions Follow instructions from your health care provider about eating and drinking, which may include:  8 hours before the procedure - stop eating heavy meals or foods such as meat, fried foods, or fatty foods.  6 hours before the procedure - stop eating light meals or foods, such as toast or cereal.  6 hours before the procedure - stop drinking milk or drinks that contain milk.  2 hours before the procedure - stop drinking clear liquids.  Medicine Ask your health care provider about:  Changing or stopping your normal medicines. This is important if you take diabetes medicines or blood thinners.  Taking medicines such as aspirin and ibuprofen. These medicines can thin your blood. Do not take these medicines before your procedure if your doctor tells you not to.  Tests  You may have blood tests.  You may have a test to check the electrical signals in your heart (electrocardiogram, ECG).  You may have imaging tests, such as a chest X-ray. General instructions  For 24 hours before the procedure, stop using products that contain nicotine or tobacco, such as cigarettes and e-cigarettes. If you need  help quitting, ask your health care provider.  Plan to have someone take you home from the hospital or clinic.  You may be asked to shower with a germ-killing soap. What happens during the procedure?  To reduce your risk of infection: ? Your health care  team will wash or sanitize their hands. ? Your skin will be washed with soap. ? Hair may be removed from the surgical area.  Small monitors will be put on your body. They will be used to check your heart, blood pressure, and oxygen level.  An IV tube will be inserted into one of your veins.  You will be given one or more of the following: ? A medicine to help you relax (sedative). ? A medicine to numb the area (local anesthetic). ? A medicine to make you fall asleep (general anesthetic).  Leads will be guided through a blood vessel into your heart and attached to your heart muscles. Depending on the ICD, the leads may go into one ventricle or they may go into both ventricles and into an upper chamber of the heart. An X-ray machine (fluoroscope) will be usedto help guide the leads.  A small incision will be made to create a deep pocket under your skin.  The pulse generator will be placed into the pocket.  The ICD will be tested.  The incision will be closed with stitches (sutures), skin glue, or staples.  A bandage (dressing) will be placed over the incision. This procedure may vary among health care providers and hospitals. What happens after the procedure?  Your blood pressure, heart rate, breathing rate, and blood oxygen level will be monitored often until the medicines you were given have worn off.  A chest X-ray will be taken to check that the ICD is in the right place.  You will need to stay in the hospital for 1-2 days so your health care provider can make sure your ICD is working.  Do not drive for 24 hours if you received a sedative. Ask your health care provider when it is safe for you to drive.  You may be given an identification card explaining that you have an ICD. Summary  An implantable cardioverter defibrillator (ICD) is a small device that is placed under the skin in the chest or abdomen. It is used to detect and correct dangerous irregular heartbeats  (arrhythmias).  An ICD consists of a battery, a small computer (pulse generator), and wires (leads) that go into the heart.  When an ICD detects rapid heart rhythm (tachycardia), it sends a low-energy shock to the heart to restore the heartbeat to normal (cardioversion). If cardioversion does not work or if the ICD detects uncoordinated heart contractions (fibrillation), it delivers a high-energy shock to the heart (defibrillation) to restart the heart.  You will need to stay in the hospital for 1-2 days to make sure your ICD is working. This information is not intended to replace advice given to you by your health care provider. Make sure you discuss any questions you have with your health care provider. Document Released: 12/18/2001 Document Revised: 04/06/2016 Document Reviewed: 04/06/2016 Elsevier Interactive Patient Education  2017 [employer].   If you need a refill on your cardiac medications before your next appointment, please call your pharmacy.

## 2017-10-20 ENCOUNTER — Other Ambulatory Visit: Payer: Self-pay | Admitting: Cardiovascular Disease

## 2017-10-20 NOTE — Telephone Encounter (Signed)
Low dose okay by cardiologist due to hx of bleeding

## 2017-10-26 ENCOUNTER — Other Ambulatory Visit: Payer: Self-pay | Admitting: Family Medicine

## 2017-10-26 ENCOUNTER — Other Ambulatory Visit: Payer: Self-pay | Admitting: Cardiovascular Disease

## 2017-10-26 ENCOUNTER — Encounter: Payer: Self-pay | Admitting: Family Medicine

## 2017-10-26 ENCOUNTER — Ambulatory Visit: Payer: Medicare Other | Admitting: Cardiovascular Disease

## 2017-10-26 NOTE — Telephone Encounter (Signed)
Rx sent to pharmacy   

## 2017-11-01 ENCOUNTER — Other Ambulatory Visit: Payer: Self-pay | Admitting: Cardiovascular Disease

## 2017-11-01 NOTE — Telephone Encounter (Signed)
Rx request sent to pharmacy.  

## 2017-11-05 ENCOUNTER — Encounter: Payer: Self-pay | Admitting: Cardiovascular Disease

## 2017-11-06 MED ORDER — METOPROLOL SUCCINATE ER 50 MG PO TB24: 50.0000 mg | ORAL_TABLET | Freq: Every day | ORAL | 3 refills | Status: DC

## 2017-11-14 ENCOUNTER — Encounter: Payer: Self-pay | Admitting: Family Medicine

## 2017-11-15 ENCOUNTER — Ambulatory Visit: Payer: Medicare Other | Admitting: Gastroenterology

## 2017-11-15 ENCOUNTER — Encounter: Payer: Self-pay | Admitting: Gastroenterology

## 2017-11-15 ENCOUNTER — Telehealth: Payer: Self-pay | Admitting: Emergency Medicine

## 2017-11-15 VITALS — BP 100/54 | HR 64 | Ht 65.0 in | Wt 159.0 lb

## 2017-11-15 DIAGNOSIS — K529 Noninfective gastroenteritis and colitis, unspecified: Secondary | ICD-10-CM | POA: Diagnosis not present

## 2017-11-15 NOTE — Progress Notes (Addendum)
11/15/2017 GENEVRA SABET [id number] 28-Nov-1930   HISTORY OF PRESENT ILLNESS:  This is an 84 year old female here with acute on chronic diarrhea.  Please see my note from 09/07/2017.  Is here again with ongoing complaints.  Had improved somewhat for a while but worsening again.  Having diarrhea up to 6 times per day, typically right after eating but at nighttime as well. Is becoming depressed because it is controlling her life.  No blood in stool.  Complains of "griping" pain in her stomach.  Is still on digoxin and Eliquis.  EF was low at 20-25%.  Using Imodium and lomotil prn without great relief.    Past Medical History:  Diagnosis Date  . Acute combined systolic and diastolic heart failure (HCC) 05/02/2017  . Anxiety   . Arthritis    "knees" (06/18/2015)  . Atrial fibrillation (HCC)    h/o sig bleed on coumadin  . Atrial fibrillation with RVR (HCC) 06/18/2015  . Cardiogenic shock (HCC) 05/02/2017  . CHF (congestive heart failure) (HCC)   . Complication of anesthesia 2010   "w/cardiac cath; got into a psychotic state for ~ 3 days; got me out w/Valium"  . Depression    "I have it off and on; not as often as I've gotten older" (06/18/2015)  . Diverticulosis    descending and sigmoid colon--Dr. Jarold Motto  . DJD (degenerative joint disease) of knee    left  . Dysrhythmia   . GERD (gastroesophageal reflux disease)   . Insomnia     off chronic ambien 5mg  as of 10/11, rare/episodic use since then  DCM/CHF  . Nonischemic cardiomyopathy (HCC)    normal coronaries, EF 15-20% 04/2008, EF 50-55% 2015  . Osteoporosis    on DXA 05/2010  . Sciatica    Right   Past Surgical History:  Procedure Laterality Date  . CARDIAC CATHETERIZATION  2010  . CARDIOVERSION N/A 06/19/2015   Procedure: CARDIOVERSION;  Surgeon: Laurey Morale, MD;  Location: [facility] ENDOSCOPY;  Service: Cardiovascular;  Laterality: N/A;  . CATARACT EXTRACTION W/ INTRAOCULAR LENS  IMPLANT, BILATERAL Bilateral   . DILATION AND  CURETTAGE OF UTERUS    . TEE WITHOUT CARDIOVERSION N/A 06/19/2015   Procedure: TRANSESOPHAGEAL ECHOCARDIOGRAM (TEE);  Surgeon: Laurey Morale, MD;  Location: [facility] ENDOSCOPY;  Service: Cardiovascular;  Laterality: N/A;  . TOTAL KNEE ARTHROPLASTY Left 04/25/2017   Procedure: TOTAL KNEE ARTHROPLASTY;  Surgeon: [name], MD;  Location: [facility] OR;  Service: Orthopedics;  Laterality: Left;  [name] VAGINAL HYSTERECTOMY  1979   ovaries intact    reports that she has never smoked. She has never used smokeless tobacco. She reports that she does not drink alcohol or use drugs. family history includes Colon cancer in her father; Other in her father; Tuberculosis in her mother. Allergies  Allergen Reactions  . Ace Inhibitors Cough  . Warfarin Sodium Other (See Comments)    DOSE RELATED PHARMACOLOGIC EFFECT "bleed out"  . Codeine Other (See Comments)    sedation  . Delsym [Dextromethorphan Polistirex Er] Other (See Comments)    dizziness  . Lactose Intolerance (Gi) Diarrhea  . Phenylephrine Palpitations and Other (See Comments)    Nasal spray- "likely increase in nasal congestion".   . Sulfamethoxazole-Trimethoprim Nausea And Vomiting    GI intolerance.      Outpatient Encounter Medications as of 11/15/2017  Medication Sig  . acetaminophen (TYLENOL) 500 MG tablet Take 500 mg by mouth 2 (two) times daily as needed.  [name]  digoxin (LANOXIN) 0.25 MG tablet Take 0.5 tablets (0.125 mg total) by mouth daily.  . diphenoxylate-atropine (LOMOTIL) 2.5-0.025 MG tablet Take 1 tablet by mouth 2 (two) times daily as needed for diarrhea or loose stools.  [name] ELIQUIS 2.5 MG TABS tablet TAKE 1 TABLET BY MOUTH TWICE A DAY  . furosemide (LASIX) 40 MG tablet TAKE 1 TABLET BY MOUTH TWICE A DAY  . KLOR-CON M20 20 MEQ tablet TAKE 1 TABLET BY MOUTH TWICE A DAY  . loperamide (IMODIUM) 2 MG capsule Take 2 mg by mouth as needed for diarrhea or loose stools.  . metoprolol succinate (TOPROL-XL) 50 MG 24 hr tablet Take 1 tablet (50 mg  total) by mouth daily. Take with or immediately following a meal.  . ranitidine (ZANTAC) 150 MG tablet Take 150 mg by mouth daily.   . sertraline (ZOLOFT) 50 MG tablet TAKE TWO (2) TABLETS BY MOUTH DAILY  . traMADol (ULTRAM) 50 MG tablet Take 50 mg by mouth every 6 (six) hours as needed.  . [DISCONTINUED] furosemide (LASIX) 40 MG tablet Take 1 tablet (40 mg total) by mouth 2 (two) times daily. TAKE SECOND dose of Lasix as needed  . [DISCONTINUED] tiZANidine (ZANAFLEX) 2 MG tablet Take by mouth as needed for muscle spasms.   No facility-administered encounter medications on file as of 11/15/2017.      REVIEW OF SYSTEMS  : All other systems reviewed and negative except where noted in the History of Present Illness.   PHYSICAL EXAM: BP (!) 100/54   Pulse 64   Ht 5\' 5"  (1.651 m)   Wt 159 lb (72.1 kg)   BMI 26.46 kg/m  General: Well developed white female in no acute distress Head: Normocephalic and atraumatic Eyes:  Sclerae anicteric, conjunctiva pink. Ears: Normal auditory acuity Lungs: Clear throughout to auscultation; no increased WOB. Heart: Regular rate and rhythm; no M/R/G. Abdomen: Soft, non-distended.  BS present.  Non-tender. Rectal:  Will be done at the time of colonoscopy. Musculoskeletal: Symmetrical with no gross deformities  Skin: No lesions on visible extremities Extremities: No edema  Neurological: Alert oriented x 4, grossly non-focal Psychological:  Alert and cooperative. Normal mood and affect  ASSESSMENT AND PLAN: *Chronic diarrhea:  Has chronic diarrhea and long-standing diagnosis of IBS, maybe some lactose intolerance as well. Seems to wax and wane.  Is on digoxin, which can cause diarrhea; that is really the only med that I see that stands out as a possible culprit.  At this point I think colonoscopy is needed to rule out microscopic colitis, etc.  She needs this done at [facility] hospital due to low EF.  Dr. Leone Payor will perform next week.  In the interim she will  continue Lomotil and Imodium prn. *Chronic anticoagulation with Eliquis for atrial flutter:  Will hold Eliquis for 1 day prior to endoscopic procedures (per Dr. Leone Payor since she has normal renal function) - will instruct when and how to resume after procedure. Benefits and risks of procedure explained including risks of bleeding, perforation, infection, missed lesions, reactions to medications and possible need for hospitalization and surgery for complications. Additional rare but real risk of stroke or other vascular clotting events off of Eliquis also explained and need to seek urgent help if any signs of these problems occur. Will communicate by phone or EMR with patient's prescribing provider, Dr. Duke Salvia, to confirm that holding Eliquis is reasonable in this case.    CC:  [name], MD  Found to have microscopic colitis and  Tx started  Agree with Ms. Rise Mu management.  Iva Boop, MD, Clementeen Graham

## 2017-11-15 NOTE — Patient Instructions (Signed)

## 2017-11-15 NOTE — Telephone Encounter (Signed)
Yes the spontaneous RP bleed was the concern.  I'm not sure there is a right answer on what to do here.

## 2017-11-15 NOTE — Telephone Encounter (Signed)
Pt takes Eliquis for afib with CHADS2VASc score of 4 (age x2, sex, CHF). Renal function is normal. Ok to hold Eliquis for 1 day prior to colonoscopy.  Would clarify Eliquis dosing with MD - she takes 2.5mg  BID dosing, however weight is > 60kg and SCr < 1.5 which would qualify her for the 5mg  BID dosing. Do see a history of spontaneous retroperitoneal bleed on Coumadin so unsure if using lower Eliquis dose due to prior bleed history.

## 2017-11-15 NOTE — Telephone Encounter (Signed)
[facility] Pre-operative Risk Assessment     Request for surgical clearance:     Endoscopy Procedure  What type of surgery is being performed?     colonoscopy  When is this surgery scheduled?     11-22-17  What type of clearance is required ?   Pharmacy  Are there any medications that need to be held prior to surgery and how long? Eliquis 1 day  Practice name and name of physician performing surgery?      [facility]  What is your office phone and fax number?      Phone- [phone]  [phone]  Anesthesia type (None, local, MAC, general) ?       MAC

## 2017-11-15 NOTE — H&P (View-Only) (Signed)
11/15/2017 [name] [id number] 28-Nov-1930   HISTORY OF PRESENT ILLNESS:  This is an 84 year old female here with acute on chronic diarrhea.  Please see my note from 09/07/2017.  Is here again with ongoing complaints.  Had improved somewhat for a while but worsening again.  Having diarrhea up to 6 times per day, typically right after eating but at nighttime as well. Is becoming depressed because it is controlling her life.  No blood in stool.  Complains of "griping" pain in her stomach.  Is still on digoxin and Eliquis.  EF was low at 20-25%.  Using Imodium and lomotil prn without great relief.    Past Medical History:  Diagnosis Date  . Acute combined systolic and diastolic heart failure (HCC) 05/02/2017  . Anxiety   . Arthritis    "knees" (06/18/2015)  . Atrial fibrillation (HCC)    h/o sig bleed on coumadin  . Atrial fibrillation with RVR (HCC) 06/18/2015  . Cardiogenic shock (HCC) 05/02/2017  . CHF (congestive heart failure) (HCC)   . Complication of anesthesia 2010   "w/cardiac cath; got into a psychotic state for ~ 3 days; got me out w/Valium"  . Depression    "I have it off and on; not as often as I've gotten older" (06/18/2015)  . Diverticulosis    descending and sigmoid colon--Dr. Jarold Motto  . DJD (degenerative joint disease) of knee    left  . Dysrhythmia   . GERD (gastroesophageal reflux disease)   . Insomnia     off chronic ambien 5mg  as of 10/11, rare/episodic use since then  DCM/CHF  . Nonischemic cardiomyopathy (HCC)    normal coronaries, EF 15-20% 04/2008, EF 50-55% 2015  . Osteoporosis    on DXA 05/2010  . Sciatica    Right   Past Surgical History:  Procedure Laterality Date  . CARDIAC CATHETERIZATION  2010  . CARDIOVERSION N/A 06/19/2015   Procedure: CARDIOVERSION;  Surgeon: Laurey Morale, MD;  Location: [facility] ENDOSCOPY;  Service: Cardiovascular;  Laterality: N/A;  . CATARACT EXTRACTION W/ INTRAOCULAR LENS  IMPLANT, BILATERAL Bilateral   . DILATION AND  CURETTAGE OF UTERUS    . TEE WITHOUT CARDIOVERSION N/A 06/19/2015   Procedure: TRANSESOPHAGEAL ECHOCARDIOGRAM (TEE);  Surgeon: Laurey Morale, MD;  Location: [facility] ENDOSCOPY;  Service: Cardiovascular;  Laterality: N/A;  . TOTAL KNEE ARTHROPLASTY Left 04/25/2017   Procedure: TOTAL KNEE ARTHROPLASTY;  Surgeon: [name], MD;  Location: [facility] OR;  Service: Orthopedics;  Laterality: Left;  [name] VAGINAL HYSTERECTOMY  1979   ovaries intact    reports that she has never smoked. She has never used smokeless tobacco. She reports that she does not drink alcohol or use drugs. family history includes Colon cancer in her father; Other in her father; Tuberculosis in her mother. Allergies  Allergen Reactions  . Ace Inhibitors Cough  . Warfarin Sodium Other (See Comments)    DOSE RELATED PHARMACOLOGIC EFFECT "bleed out"  . Codeine Other (See Comments)    sedation  . Delsym [Dextromethorphan Polistirex Er] Other (See Comments)    dizziness  . Lactose Intolerance (Gi) Diarrhea  . Phenylephrine Palpitations and Other (See Comments)    Nasal spray- "likely increase in nasal congestion".   . Sulfamethoxazole-Trimethoprim Nausea And Vomiting    GI intolerance.      Outpatient Encounter Medications as of 11/15/2017  Medication Sig  . acetaminophen (TYLENOL) 500 MG tablet Take 500 mg by mouth 2 (two) times daily as needed.  [name]  digoxin (LANOXIN) 0.25 MG tablet Take 0.5 tablets (0.125 mg total) by mouth daily.  . diphenoxylate-atropine (LOMOTIL) 2.5-0.025 MG tablet Take 1 tablet by mouth 2 (two) times daily as needed for diarrhea or loose stools.  [name] ELIQUIS 2.5 MG TABS tablet TAKE 1 TABLET BY MOUTH TWICE A DAY  . furosemide (LASIX) 40 MG tablet TAKE 1 TABLET BY MOUTH TWICE A DAY  . KLOR-CON M20 20 MEQ tablet TAKE 1 TABLET BY MOUTH TWICE A DAY  . loperamide (IMODIUM) 2 MG capsule Take 2 mg by mouth as needed for diarrhea or loose stools.  . metoprolol succinate (TOPROL-XL) 50 MG 24 hr tablet Take 1 tablet (50 mg  total) by mouth daily. Take with or immediately following a meal.  . ranitidine (ZANTAC) 150 MG tablet Take 150 mg by mouth daily.   . sertraline (ZOLOFT) 50 MG tablet TAKE TWO (2) TABLETS BY MOUTH DAILY  . traMADol (ULTRAM) 50 MG tablet Take 50 mg by mouth every 6 (six) hours as needed.  . [DISCONTINUED] furosemide (LASIX) 40 MG tablet Take 1 tablet (40 mg total) by mouth 2 (two) times daily. TAKE SECOND dose of Lasix as needed  . [DISCONTINUED] tiZANidine (ZANAFLEX) 2 MG tablet Take by mouth as needed for muscle spasms.   No facility-administered encounter medications on file as of 11/15/2017.      REVIEW OF SYSTEMS  : All other systems reviewed and negative except where noted in the History of Present Illness.   PHYSICAL EXAM: BP (!) 100/54   Pulse 64   Ht 5\' 5"  (1.651 m)   Wt 159 lb (72.1 kg)   BMI 26.46 kg/m  General: Well developed white female in no acute distress Head: Normocephalic and atraumatic Eyes:  Sclerae anicteric, conjunctiva pink. Ears: Normal auditory acuity Lungs: Clear throughout to auscultation; no increased WOB. Heart: Regular rate and rhythm; no M/R/G. Abdomen: Soft, non-distended.  BS present.  Non-tender. Rectal:  Will be done at the time of colonoscopy. Musculoskeletal: Symmetrical with no gross deformities  Skin: No lesions on visible extremities Extremities: No edema  Neurological: Alert oriented x 4, grossly non-focal Psychological:  Alert and cooperative. Normal mood and affect  ASSESSMENT AND PLAN: *Chronic diarrhea:  Has chronic diarrhea and long-standing diagnosis of IBS, maybe some lactose intolerance as well. Seems to wax and wane.  Is on digoxin, which can cause diarrhea; that is really the only med that I see that stands out as a possible culprit.  At this point I think colonoscopy is needed to rule out microscopic colitis, etc.  She needs this done at [facility] hospital due to low EF.  Dr. Leone Payor will perform next week.  In the interim she will  continue Lomotil and Imodium prn. *Chronic anticoagulation with Eliquis for atrial flutter:  Will hold Eliquis for 1 day prior to endoscopic procedures (per Dr. Leone Payor since she has normal renal function) - will instruct when and how to resume after procedure. Benefits and risks of procedure explained including risks of bleeding, perforation, infection, missed lesions, reactions to medications and possible need for hospitalization and surgery for complications. Additional rare but real risk of stroke or other vascular clotting events off of Eliquis also explained and need to seek urgent help if any signs of these problems occur. Will communicate by phone or EMR with patient's prescribing provider, Dr. Duke Salvia, to confirm that holding Eliquis is reasonable in this case.    CC:  [name], MD

## 2017-11-15 NOTE — Telephone Encounter (Signed)
Will route to [facility] for anticoagulation recommendations. [name], PA-C    11/15/2017 2:15 PM

## 2017-11-15 NOTE — Telephone Encounter (Signed)
84 yo female with hx of chronic combined systolic and diastolic CHF, permament AFib and prior spontaneous retroperitoneal bleed on Coumadin.  Last seen by Dr. Duke Salvia in 09/2017.  I contacted the patient 11/15/2017.  She is doing well.  She denies chest pain, shortness of breath, paroxysmal nocturnal dyspnea, edema.    RCRI:  0.9%.  She may proceed with colonoscopy at acceptable risk.  Per office protocol, ok to hold Eliquis for 1 day prior to colonoscopy. Please resume post op when felt to be safe.  [name], PA-C    11/15/2017 5:22 PM

## 2017-11-20 NOTE — Telephone Encounter (Signed)
Spoke to patient she is aware to hold her Eliquis for one day prior to procedure.

## 2017-11-21 ENCOUNTER — Encounter (HOSPITAL_COMMUNITY): Payer: Self-pay

## 2017-11-22 ENCOUNTER — Ambulatory Visit (HOSPITAL_COMMUNITY)
Admission: RE | Admit: 2017-11-22 | Discharge: 2017-11-22 | Disposition: A | Discharge status: Home / Self Care | Payer: Medicare Other | Source: Ambulatory Visit | Attending: Internal Medicine | Admitting: Internal Medicine

## 2017-11-22 ENCOUNTER — Encounter (HOSPITAL_COMMUNITY)
Admission: RE | Disposition: A | Discharge status: Home / Self Care | Payer: Self-pay | Source: Ambulatory Visit | Attending: Internal Medicine

## 2017-11-22 ENCOUNTER — Ambulatory Visit (HOSPITAL_COMMUNITY): Payer: Medicare Other | Admitting: Anesthesiology

## 2017-11-22 ENCOUNTER — Encounter (HOSPITAL_COMMUNITY): Payer: Self-pay | Admitting: *Deleted

## 2017-11-22 ENCOUNTER — Other Ambulatory Visit: Payer: Self-pay

## 2017-11-22 DIAGNOSIS — F419 Anxiety disorder, unspecified: Secondary | ICD-10-CM | POA: Diagnosis not present

## 2017-11-22 DIAGNOSIS — K52832 Lymphocytic colitis: Secondary | ICD-10-CM | POA: Diagnosis not present

## 2017-11-22 DIAGNOSIS — Z96652 Presence of left artificial knee joint: Secondary | ICD-10-CM | POA: Insufficient documentation

## 2017-11-22 DIAGNOSIS — K529 Noninfective gastroenteritis and colitis, unspecified: Secondary | ICD-10-CM

## 2017-11-22 DIAGNOSIS — K5730 Diverticulosis of large intestine without perforation or abscess without bleeding: Secondary | ICD-10-CM | POA: Insufficient documentation

## 2017-11-22 DIAGNOSIS — I5041 Acute combined systolic (congestive) and diastolic (congestive) heart failure: Secondary | ICD-10-CM | POA: Insufficient documentation

## 2017-11-22 DIAGNOSIS — Z7901 Long term (current) use of anticoagulants: Secondary | ICD-10-CM | POA: Insufficient documentation

## 2017-11-22 DIAGNOSIS — F329 Major depressive disorder, single episode, unspecified: Secondary | ICD-10-CM | POA: Insufficient documentation

## 2017-11-22 DIAGNOSIS — Z79899 Other long term (current) drug therapy: Secondary | ICD-10-CM | POA: Insufficient documentation

## 2017-11-22 DIAGNOSIS — K219 Gastro-esophageal reflux disease without esophagitis: Secondary | ICD-10-CM | POA: Diagnosis not present

## 2017-11-22 DIAGNOSIS — I4891 Unspecified atrial fibrillation: Secondary | ICD-10-CM | POA: Diagnosis not present

## 2017-11-22 HISTORY — PX: COLONOSCOPY WITH PROPOFOL: SHX5780

## 2017-11-22 HISTORY — PX: BIOPSY: SHX5522

## 2017-11-22 SURGERY — COLONOSCOPY WITH PROPOFOL
Anesthesia: Monitor Anesthesia Care

## 2017-11-22 MED ORDER — SERTRALINE HCL 50 MG PO TABS: ORAL_TABLET | ORAL | 3 refills | Status: DC

## 2017-11-22 MED ORDER — PROPOFOL 10 MG/ML IV BOLUS
INTRAVENOUS | Status: AC
  Filled 2017-11-22: qty 40

## 2017-11-22 MED ORDER — LIDOCAINE 2% (20 MG/ML) 5 ML SYRINGE
INTRAMUSCULAR | Status: DC | PRN
  Administered 2017-11-22: 60 mg via INTRAVENOUS

## 2017-11-22 MED ORDER — FUROSEMIDE 40 MG PO TABS: ORAL_TABLET | ORAL | 3 refills | Status: DC

## 2017-11-22 MED ORDER — PROPOFOL 10 MG/ML IV BOLUS
INTRAVENOUS | Status: DC | PRN
  Administered 2017-11-22 (×4): 20 mg via INTRAVENOUS

## 2017-11-22 MED ORDER — LACTATED RINGERS IV SOLN
INTRAVENOUS | Status: DC
  Administered 2017-11-22: 13:00:00 via INTRAVENOUS

## 2017-11-22 MED ORDER — SODIUM CHLORIDE 0.9 % IV SOLN: INTRAVENOUS | Status: DC

## 2017-11-22 MED ORDER — PROPOFOL 500 MG/50ML IV EMUL
INTRAVENOUS | Status: DC | PRN
  Administered 2017-11-22: 120 ug/kg/min via INTRAVENOUS

## 2017-11-22 SURGICAL SUPPLY — 21 items

## 2017-11-22 NOTE — Anesthesia Procedure Notes (Signed)
Date/Time: 11/22/2017 2:46 PM Performed by: Florene Route, CRNA Oxygen Delivery Method: Simple face mask

## 2017-11-22 NOTE — Anesthesia Postprocedure Evaluation (Signed)
Anesthesia Post Note  Patient: [name]  Procedure(s) Performed: COLONOSCOPY WITH PROPOFOL (N/A ) BIOPSY     Patient location during evaluation: Endoscopy Anesthesia Type: MAC Level of consciousness: awake and alert Pain management: pain level controlled Vital Signs Assessment: post-procedure vital signs reviewed and stable Respiratory status: spontaneous breathing, nonlabored ventilation and respiratory function stable Cardiovascular status: blood pressure returned to baseline and stable Postop Assessment: no apparent nausea or vomiting Anesthetic complications: no    Last Vitals:  Vitals:   11/22/17 1515 11/22/17 1521  BP: 111/71 (!) 134/52  Pulse: 72 79  Resp: 11 19  Temp: 36.6 C   SpO2: 100% 100%    Last Pain:  Vitals:   11/22/17 1521  TempSrc:   PainSc: 0-No pain                 Leticia Mcdiarmid L Rockell Faulks

## 2017-11-22 NOTE — Transfer of Care (Signed)
Immediate Anesthesia Transfer of Care Note  Patient: [name]  Procedure(s) Performed: COLONOSCOPY WITH PROPOFOL (N/A ) BIOPSY  Patient Location: Endoscopy Unit  Anesthesia Type:MAC  Level of Consciousness: awake and alert   Airway & Oxygen Therapy: Patient Spontanous Breathing and Patient connected to face mask oxygen  Post-op Assessment: Report given to RN and Post -op Vital signs reviewed and stable  Post vital signs: Reviewed and stable  Last Vitals:  Vitals Value Taken Time  BP    Temp    Pulse    Resp    SpO2      Last Pain:  Vitals:   11/22/17 1235  TempSrc: Oral  PainSc: 0-No pain         Complications: No apparent anesthesia complications

## 2017-11-22 NOTE — Op Note (Signed)
[facility] Patient Name: [name] Procedure Date: 11/22/2017 MRN: [id number] Attending MD: Iva Boop , MD Date of Birth: 1930/12/07 CSN: [id number] Age: 84 Admit Type: Outpatient Procedure:                Colonoscopy Indications:              Chronic diarrhea, Clinically significant diarrhea                            of unexplained origin Providers:                Iva Boop, MD, Tomma Rakers, RN, Zoila Shutter, Technician, Leroy Libman, CRNA Referring MD:              Medicines:                Propofol per Anesthesia, Monitored Anesthesia Care Complications:            No immediate complications. Estimated Blood Loss:     Estimated blood loss was minimal. Procedure:                Pre-Anesthesia Assessment:                           - Prior to the procedure, a History and Physical                            was performed, and patient medications and                            allergies were reviewed. The patient's tolerance of                            previous anesthesia was also reviewed. The risks                            and benefits of the procedure and the sedation                            options and risks were discussed with the patient.                            All questions were answered, and informed consent                            was obtained. Prior Anticoagulants: The patient                            last took Eliquis (apixaban) 2 days prior to the                            procedure. ASA Grade Assessment: III - A patient  with severe systemic disease. After reviewing the                            risks and benefits, the patient was deemed in                            satisfactory condition to undergo the procedure.                           After obtaining informed consent, the colonoscope                            was passed under direct vision. Throughout the                         procedure, the patient's blood pressure, pulse, and                            oxygen saturations were monitored continuously. The                            PCF-H190DL ([id number] Olympus peds colonoscope was                            introduced through the anus and advanced to the the                            cecum, identified by appendiceal orifice and                            ileocecal valve. The colonoscopy was performed                            without difficulty. The patient tolerated the                            procedure well. The quality of the bowel                            preparation was good. The ileocecal valve,                            appendiceal orifice, and rectum were photographed. Scope In: 2:53:30 PM Scope Out: 3:08:31 PM Scope Withdrawal Time: 0 hours 7 minutes 59 seconds  Total Procedure Duration: 0 hours 15 minutes 1 second  Findings:      The perianal and digital rectal examinations were normal.      Scattered diverticula were found in the entire colon.      The exam was otherwise without abnormality on direct and retroflexion       views.      Biopsies for histology were taken with a cold forceps from the entire       colon for evaluation of microscopic colitis. Impression:               - Diverticulosis in the entire examined  colon.                           - The examination was otherwise normal on direct                            and retroflexion views.                           - Biopsies were taken with a cold forceps from the                            entire colon for evaluation of microscopic colitis. Moderate Sedation:      N/A- Per Anesthesia Care Recommendation:           - Patient has a contact number available for                            emergencies. The signs and symptoms of potential                            delayed complications were discussed with the                            patient. Return to normal  activities tomorrow.                            Written discharge instructions were provided to the                            patient.                           - Resume previous diet.                           - Continue present medications.                           - Resume Eliquis (apixaban) at prior dose tomorrow.                           - Await pathology results.                           - If no colitis will need to consider round of                            Xifaxan, increasing loperamide (currently about 2 a                            day) or other IBS-like Tx Procedure Code(s):        --- Professional ---                           [phone], Colonoscopy, flexible; with biopsy, single  or multiple Diagnosis Code(s):        --- Professional ---                           K52.9, Noninfective gastroenteritis and colitis,                            unspecified                           R19.7, Diarrhea, unspecified                           K57.30, Diverticulosis of large intestine without                            perforation or abscess without bleeding CPT copyright 2017 American Medical Association. All rights reserved. The codes documented in this report are preliminary and upon coder review may  be revised to meet current compliance requirements. Iva Boop, MD 11/22/2017 3:22:01 PM This report has been signed electronically. Number of Addenda: 0

## 2017-11-22 NOTE — Interval H&P Note (Signed)
History and Physical Interval Note:  11/22/2017 1:39 PM  [name]  has presented today for surgery, with the diagnosis of diarrhea/db  The various methods of treatment have been discussed with the patient and family. After consideration of risks, benefits and other options for treatment, the patient has consented to  Procedure(s): COLONOSCOPY WITH PROPOFOL (N/A) as a surgical intervention .  The patient's history has been reviewed, patient examined, no change in status, stable for surgery.  I have reviewed the patient's chart and labs.  Questions were answered to the patient's satisfaction.     Stan Head

## 2017-11-22 NOTE — Anesthesia Preprocedure Evaluation (Signed)
Anesthesia Evaluation  Patient identified by MRN, date of birth, ID band  Reviewed: Allergy & Precautions, NPO status , Patient's Chart, lab work & pertinent test results  Airway Mallampati: II  TM Distance: >3 FB Neck ROM: Full    Dental  (+) Teeth Intact, Dental Advisory Given   Pulmonary neg pulmonary ROS,    breath sounds clear to auscultation       Cardiovascular +CHF  + dysrhythmias (on metoprolol, digoxin, and eliquis. Last dose of eliquis was Monday night) Atrial Fibrillation  Rhythm:Irregular Rate:Normal  TTE 07/2017 EF 20-25%. Diffuse hypokinesis. There is dyskinesis of the anteroseptal myocardium.   Neuro/Psych Depression    GI/Hepatic GERD  Medicated,  Endo/Other  negative endocrine ROS  Renal/GU negative Renal ROS  negative genitourinary   Musculoskeletal  (+) Arthritis , Osteoarthritis,    Abdominal   Peds  Hematology negative hematology ROS (+)   Anesthesia Other Findings   Reproductive/Obstetrics                             Anesthesia Physical Anesthesia Plan  ASA: III  Anesthesia Plan: MAC   Post-op Pain Management:    Induction: Intravenous  PONV Risk Score and Plan: 2 and Propofol infusion and Treatment may vary due to age or medical condition  Airway Management Planned: Simple Face Mask and Natural Airway  Additional Equipment:   Intra-op Plan:   Post-operative Plan:   Informed Consent: I have reviewed the patients History and Physical, chart, labs and discussed the procedure including the risks, benefits and alternatives for the proposed anesthesia with the patient or authorized representative who has indicated his/her understanding and acceptance.   Dental advisory given  Plan Discussed with: CRNA  Anesthesia Plan Comments:         Anesthesia Quick Evaluation

## 2017-11-22 NOTE — Discharge Instructions (Signed)
° °  I did not see any problems but took biopsies to see if there is microscopic inflammation.  You do have some diverticulosis but that is not causing problems.  We will call you with results (biopsies) and plans.  I appreciate the opportunity to care for you. Iva Boop, MD, FACG  YOU HAD AN ENDOSCOPIC PROCEDURE TODAY: Refer to the procedure report and other information in the discharge instructions given to you for any specific questions about what was found during the examination. If this information does not answer your questions, please call Dr. Marvell Fuller office at [phone] to clarify.   YOU SHOULD EXPECT: Some feelings of bloating in the abdomen. Passage of more gas than usual. Walking can help get rid of the air that was put into your GI tract during the procedure and reduce the bloating. If you had a lower endoscopy (such as a colonoscopy or flexible sigmoidoscopy) you may notice spotting of blood in your stool or on the toilet paper. Some abdominal soreness may be present for a day or two, also.  DIET: Your first meal following the procedure should be a light meal and then it is ok to progress to your normal diet. A half-sandwich or bowl of soup is an example of a good first meal. Heavy or fried foods are harder to digest and may make you feel nauseous or bloated. Drink plenty of fluids but you should avoid alcoholic beverages for 24 hours.   ACTIVITY: Your care partner should take you home directly after the procedure. You should plan to take it easy, moving slowly for the rest of the day. You can resume normal activity the day after the procedure however YOU SHOULD NOT DRIVE, use power tools, machinery or perform tasks that involve climbing or major physical exertion for 24 hours (because of the sedation medicines used during the test).   SYMPTOMS TO REPORT IMMEDIATELY: A gastroenterologist can be reached at any hour. Please call [phone]  for any of the following  symptoms:  Following lower endoscopy (colonoscopy, flexible sigmoidoscopy) Excessive amounts of blood in the stool  Significant tenderness, worsening of abdominal pains  Swelling of the abdomen that is new, acute  Fever of 100 or higher   FOLLOW UP:  If any biopsies were taken you will be contacted by phone or by letter within the next 1-3 weeks. Call [phone]  if you have not heard about the biopsies in 3 weeks.  Please also call with any specific questions about appointments or follow up tests.

## 2017-11-24 ENCOUNTER — Encounter (HOSPITAL_COMMUNITY): Payer: Self-pay | Admitting: Internal Medicine

## 2017-11-28 ENCOUNTER — Other Ambulatory Visit: Payer: Self-pay | Admitting: Family Medicine

## 2017-11-28 NOTE — Telephone Encounter (Signed)
Electronic refill request Last refill 09/29/17 #30 Last office visit 08/22/17

## 2017-11-29 ENCOUNTER — Encounter: Payer: Self-pay | Admitting: Internal Medicine

## 2017-11-29 ENCOUNTER — Other Ambulatory Visit: Payer: Self-pay

## 2017-11-29 MED ORDER — BUDESONIDE 3 MG PO CPEP: 9.0000 mg | ORAL_CAPSULE | Freq: Every day | ORAL | 2 refills | Status: DC

## 2017-11-29 NOTE — Telephone Encounter (Signed)
Sent. Thanks.   

## 2017-11-29 NOTE — Progress Notes (Signed)
Budesonide  

## 2017-11-29 NOTE — Progress Notes (Signed)
Biopsies show lymphocytic colitis Please rx budesonide 3 mg (Generic Entocort) take 9 mg daily # 90 and 2 refills  She is NOT to stop it unless she has problems with and should call  IF it is too costly use prednisone 5 mg tabs take 20 mg daily x 2 week, 15 mg daily x 2 weeks and then 10 mg daily until she sees Jessica  Set up f/u Jess 4-6 weeks  Also if she is not getting better (I.e. Showing improvement) not necessarily resolution after 2 weeks I need to know  Disp # 160

## 2017-12-16 ENCOUNTER — Encounter: Payer: Self-pay | Admitting: Family Medicine

## 2017-12-17 ENCOUNTER — Other Ambulatory Visit: Payer: Self-pay | Admitting: Cardiovascular Disease

## 2017-12-25 ENCOUNTER — Encounter: Payer: Self-pay | Admitting: Family Medicine

## 2018-01-04 ENCOUNTER — Ambulatory Visit: Payer: Medicare Other | Admitting: Gastroenterology

## 2018-01-04 ENCOUNTER — Ambulatory Visit (INDEPENDENT_AMBULATORY_CARE_PROVIDER_SITE_OTHER): Payer: Medicare Other

## 2018-01-04 ENCOUNTER — Encounter: Payer: Self-pay | Admitting: Gastroenterology

## 2018-01-04 VITALS — BP 122/60 | HR 80 | Ht 65.0 in | Wt 156.0 lb

## 2018-01-04 DIAGNOSIS — Z23 Encounter for immunization: Secondary | ICD-10-CM

## 2018-01-04 DIAGNOSIS — K52839 Microscopic colitis, unspecified: Secondary | ICD-10-CM

## 2018-01-04 NOTE — Progress Notes (Addendum)
01/04/2018 ROLENE ANDRADES [id number] 06/10/1930   HISTORY OF PRESENT ILLNESS: This is an 84 year-old female who is a patient of Dr. Marvell Fuller.  She is here for follow-up of her diarrhea.  She had been having diarrhea for several months and was being treated symptomatically.  Eventually underwent colonoscopy in August and her random biopsies were performed and showed microscopic colitis.  She was started on Entocort 9 mg daily.  She has been on that for now for the past month and is doing much better.  Her husband said "it is night and day difference".  She is still having about 3-4 bowel movements a day, but they are becoming more formed and is much improved.  Has experienced nocturnal bowel movements on occasion or 2, but not nearly like it had been previously.  Still gets intermittent cramping.   Past Medical History:  Diagnosis Date  . Acute combined systolic and diastolic heart failure (HCC) 05/02/2017  . Anxiety   . Arthritis    "knees" (06/18/2015)  . Atrial fibrillation (HCC)    h/o sig bleed on coumadin  . Atrial fibrillation with RVR (HCC) 06/18/2015  . Cardiogenic shock (HCC) 05/02/2017  . CHF (congestive heart failure) (HCC)   . Complication of anesthesia 2010   "w/cardiac cath; got into a psychotic state for ~ 3 days; got me out w/Valium"  . Depression    "I have it off and on; not as often as I've gotten older" (06/18/2015)  . Diverticulosis    descending and sigmoid colon--Dr. Jarold Motto  . DJD (degenerative joint disease) of knee    left  . Dysrhythmia   . GERD (gastroesophageal reflux disease)   . Insomnia     off chronic ambien 5mg  as of 10/11, rare/episodic use since then  DCM/CHF  . Lymphocytic colitis 09/27/2016  . Nonischemic cardiomyopathy (HCC)    normal coronaries, EF 15-20% 04/2008, EF 50-55% 2015  . Osteoporosis    on DXA 05/2010  . Sciatica    Right   Past Surgical History:  Procedure Laterality Date  . BIOPSY  11/22/2017   Procedure: BIOPSY;  Surgeon:  Iva Boop, MD;  Location: [facility] ENDOSCOPY;  Service: Endoscopy;;  . CARDIAC CATHETERIZATION  2010  . CARDIOVERSION N/A 06/19/2015   Procedure: CARDIOVERSION;  Surgeon: Laurey Morale, MD;  Location: [facility] ENDOSCOPY;  Service: Cardiovascular;  Laterality: N/A;  . CATARACT EXTRACTION W/ INTRAOCULAR LENS  IMPLANT, BILATERAL Bilateral   . COLONOSCOPY WITH PROPOFOL N/A 11/22/2017   Procedure: COLONOSCOPY WITH PROPOFOL;  Surgeon: Iva Boop, MD;  Location: [facility] ENDOSCOPY;  Service: Endoscopy;  Laterality: N/A;  . DILATION AND CURETTAGE OF UTERUS    . TEE WITHOUT CARDIOVERSION N/A 06/19/2015   Procedure: TRANSESOPHAGEAL ECHOCARDIOGRAM (TEE);  Surgeon: Laurey Morale, MD;  Location: [facility] ENDOSCOPY;  Service: Cardiovascular;  Laterality: N/A;  . TOTAL KNEE ARTHROPLASTY Left 04/25/2017   Procedure: TOTAL KNEE ARTHROPLASTY;  Surgeon: [name], MD;  Location: [facility] OR;  Service: Orthopedics;  Laterality: Left;  [name] VAGINAL HYSTERECTOMY  1979   ovaries intact    reports that she has never smoked. She has never used smokeless tobacco. She reports that she does not drink alcohol or use drugs. family history includes Colon cancer in her father; Other in her father; Tuberculosis in her mother. Allergies  Allergen Reactions  . Ace Inhibitors Cough  . Warfarin Sodium Other (See Comments)    DOSE RELATED PHARMACOLOGIC EFFECT "bleed out"  . Codeine Other (See Comments)  sedation  . Delsym [Dextromethorphan Polistirex Er] Other (See Comments)    dizziness  . Lactose Intolerance (Gi) Diarrhea  . Phenylephrine Palpitations and Other (See Comments)    Nasal spray- "likely increase in nasal congestion".   . Sulfamethoxazole-Trimethoprim Nausea And Vomiting    GI intolerance.      Outpatient Encounter Medications as of 01/04/2018  Medication Sig  . acetaminophen (TYLENOL) 500 MG tablet Take 500 mg by mouth 2 (two) times daily as needed for moderate pain or headache.   . Alpha-D-Galactosidase (BEANO PO)  Take 1 tablet by mouth daily as needed (for gas).  . budesonide (ENTOCORT EC) 3 MG 24 hr capsule Take 3 capsules (9 mg total) by mouth daily.  . calcium carbonate (TUMS - DOSED IN MG ELEMENTAL CALCIUM) 500 MG chewable tablet Chew 1 tablet by mouth 3 (three) times daily as needed for indigestion or heartburn.   . Carboxymethylcellul-Glycerin ([employer] LUBRICATING/DRY EYE OP) Place 1-2 drops into both eyes daily as needed (for dry eyes).  [name] digoxin (LANOXIN) 0.25 MG tablet Take 0.5 tablets (0.125 mg total) by mouth daily.  . diphenoxylate-atropine (LOMOTIL) 2.5-0.025 MG tablet Take 1 tablet by mouth 2 (two) times daily as needed for diarrhea or loose stools.  [name] ELIQUIS 2.5 MG TABS tablet TAKE 1 TABLET BY MOUTH TWICE A DAY  . furosemide (LASIX) 40 MG tablet Take 40 mg by mouth daily. May take additional 40 mg if needed for weight gain.  [name] KLOR-CON M20 20 MEQ tablet TAKE 1 TABLET BY MOUTH TWICE A DAY  . loperamide (IMODIUM) 2 MG capsule Take 2 mg by mouth as needed for diarrhea or loose stools.  . metoprolol succinate (TOPROL-XL) 50 MG 24 hr tablet Take 1 tablet (50 mg total) by mouth daily. Take with or immediately following a meal.  . ranitidine (ZANTAC) 150 MG tablet Take 150 mg by mouth daily.   . sertraline (ZOLOFT) 50 MG tablet Take 50 mg by mouth daily  . [DISCONTINUED] diphenoxylate-atropine (LOMOTIL) 2.5-0.025 MG tablet Take 1 tablet by mouth 2 (two) times daily as needed for diarrhea or loose stools.   No facility-administered encounter medications on file as of 01/04/2018.      REVIEW OF SYSTEMS  : All other systems reviewed and negative except where noted in the History of Present Illness.   PHYSICAL EXAM: BP 122/60   Pulse 80   Ht 5\' 5"  (1.651 m)   Wt 156 lb (70.8 kg)   BMI 25.96 kg/m  General: Well developed white female in no acute distress Head: Normocephalic and atraumatic Eyes:  Sclerae anicteric, conjunctiva pink. Ears: Normal auditory acuity Lungs: Clear throughout to  auscultation; no increased WOB. Heart: Irregularly irregular. Abdomen: Soft, non-distended.  Normal bowel sounds.  Non-tender. Musculoskeletal: Symmetrical with no gross deformities  Skin: No lesions on visible extremities Extremities: No edema  Neurological: Alert oriented x 4, grossly non-focal Psychological:  Alert and cooperative. Normal mood and affect  ASSESSMENT AND PLAN: *Microscopic/lymphocytic colitis:  Much better on Entocort 9 mg daily.  Has been on this for about one month.  Will continue at this dose for another 4 weeks and then she will send [place] a message with an update on her symptoms are which time we could consider tapering to 6 mg daily.   CC:  [name], MD

## 2018-01-04 NOTE — Patient Instructions (Signed)
If you are age 82 or older, your body mass index should be between 23-30. Your Body mass index is 25.96 kg/m. If this is out of the aforementioned range listed, please consider follow up with your Primary Care Provider.  If you are age 53 or younger, your body mass index should be between 19-25. Your Body mass index is 25.96 kg/m. If this is out of the aformentioned range listed, please consider follow up with your Primary Care Provider.   Continue Entocort 9 mg for four more weeks.  Send message through MyChart with an update.  Thank you for choosing me and [facility].   Doug Sou, PA-C

## 2018-02-01 ENCOUNTER — Ambulatory Visit: Payer: Medicare Other | Admitting: Cardiovascular Disease

## 2018-02-01 ENCOUNTER — Encounter: Payer: Self-pay | Admitting: Cardiovascular Disease

## 2018-02-01 VITALS — BP 114/68 | HR 70 | Ht 65.0 in | Wt 159.0 lb

## 2018-02-01 DIAGNOSIS — I5042 Chronic combined systolic (congestive) and diastolic (congestive) heart failure: Secondary | ICD-10-CM

## 2018-02-01 DIAGNOSIS — Z5181 Encounter for therapeutic drug level monitoring: Secondary | ICD-10-CM

## 2018-02-01 DIAGNOSIS — I4821 Permanent atrial fibrillation: Secondary | ICD-10-CM

## 2018-02-01 DIAGNOSIS — I10 Essential (primary) hypertension: Secondary | ICD-10-CM

## 2018-02-01 DIAGNOSIS — Z79899 Other long term (current) drug therapy: Secondary | ICD-10-CM

## 2018-02-01 NOTE — Patient Instructions (Signed)
Medication Instructions:  Your physician recommends that you continue on your current medications as directed. Please refer to the Current Medication list given to you today.  If you need a refill on your cardiac medications before your next appointment, please call your pharmacy.   Lab work: FASTING LP/CMET/CBC/DIGOXIN LEVEL SOON  If you have labs (blood work) drawn today and your tests are completely normal, you will receive your results only by: [name] MyChart Message (if you have MyChart) OR . A paper copy in the mail If you have any lab test that is abnormal or we need to change your treatment, we will call you to review the results.  Testing/Procedures: NONE  Follow-Up: At [facility], you and your health needs are our priority.  As part of our continuing mission to provide you with exceptional heart care, we have created designated Provider Care Teams.  These Care Teams include your primary Cardiologist (physician) and Advanced Practice Providers (APPs -  Physician Assistants and Nurse Practitioners) who all work together to provide you with the care you need, when you need it. You will need a follow up appointment in 6 months.  Please call our office 2 months in advance to schedule this appointment.  You may see [name], MD or one of the following Advanced Practice Providers on your designated Care Team:   Corine Shelter, PA-C Judy Pimple, [place] . Marjie Skiff, PA-C

## 2018-02-01 NOTE — Progress Notes (Signed)
Cardiology Office Note   Date:  02/01/2018   ID:  [name], DOB 1930-09-22, MRN [id number]  PCP:  Catherine Nam, MD  Cardiologist:   Catherine Si, MD   Chief Complaint  Patient presents with  . Follow-up  . Dizziness    occasionally.  . Edema    in ankles slightly.     History of Present Illness: [name] is a 84 y.o. female with chronic atrial fibrillation, chronic systolic and diastolic heart failure, left bundle branch block, spontaneous retroperitoneal bleed on Coumadin who presents for follow-up.  She is previously seen by Dr. Barrington Eaton [facility] 03/2017 and cleared for surgery.  At that time she was in atrial fibrillation with well-controlled rates.  She underwent left knee replacement 04/2017.  Her hospital course was complicated by atrial fibrillation with rapid ventricular response and acute on chronic systolic and diastolic heart failure.  Echo that admission revealed LVEF 15% which was down from 50% in 2017.  She was diuresed with IV Lasix and had a Lexiscan Myoview without evidence of ischemia.  She failed amiodarone and was started on digoxin.  She had a repeat echocardiogram 07/2017 that revealed LVEF 20 to 25%.  [name] followed up with Catherine Gavia, PA, ad was doing well on 05/2017.  She was struggling with anxiety but asymptomatic from a cardiac standpoint.  She then followed up with Catherine Demark, PA-C on 06/02/17 and reported episodes of feeling fluttering in her chest.  She also noted that her appetite was poor and that she had not been very active.  She was thought to be dry and Lasix was switched to once a day.  Losartan was discontinued due to low BP and fatigue.  Her fatigue has improved.  She also struggled with diarrhea and stopped taking her digoxin in hopes that this would help.  However the diarrhea did not improve.  She brings records of her blood pressure and heart rate showing that her blood pressure has been well-controlled.  At her last  appointment digoxin was resumed.  That time she has been feeling well physically.  Her main complaint is feeling generally tired.  This is been ongoing for years.  She is not exercising.  She attributes this to feeling depressed not having any motivation.  She previously tried increasing her Zoloft but it did make her feel right so she reduced it back to 50 mg daily.  She discussed this with her PCP but he recommended seeing a psychiatrist.  She was not interested in doing this because she did not think talking about it would help.  Since her last appointment she had a spontaneous bleed into her L knee.   At her last appointment we discussed ICD.  She has decided that when it is her time she wants to go ahead and pass peacefully.  Therefore she is not interested in having an ICD placed.  She has not experienced any lower extremity edema, orthopnea, or PND.  Her breathing has been stable.   Past Medical History:  Diagnosis Date  . Acute combined systolic and diastolic heart failure (HCC) 05/02/2017  . Anxiety   . Arthritis    "knees" (06/18/2015)  . Atrial fibrillation (HCC)    h/o sig bleed on coumadin  . Atrial fibrillation with RVR (HCC) 06/18/2015  . Cardiogenic shock (HCC) 05/02/2017  . CHF (congestive heart failure) (HCC)   . Complication of anesthesia 2010   "w/cardiac cath; got into a psychotic state for ~  3 days; got me out w/Valium"  . Depression    "I have it off and on; not as often as I've gotten older" (06/18/2015)  . Diverticulosis    descending and sigmoid colon--Dr. Jarold Motto  . DJD (degenerative joint disease) of knee    left  . Dysrhythmia   . GERD (gastroesophageal reflux disease)   . Insomnia     off chronic ambien 5mg  as of 10/11, rare/episodic use since then  DCM/CHF  . Lymphocytic colitis 09/27/2016  . Nonischemic cardiomyopathy (HCC)    normal coronaries, EF 15-20% 04/2008, EF 50-55% 2015  . Osteoporosis    on DXA 05/2010  . Sciatica    Right    Past Surgical  History:  Procedure Laterality Date  . BIOPSY  11/22/2017   Procedure: BIOPSY;  Surgeon: Iva Boop, MD;  Location: [facility] ENDOSCOPY;  Service: Endoscopy;;  . CARDIAC CATHETERIZATION  2010  . CARDIOVERSION N/A 06/19/2015   Procedure: CARDIOVERSION;  Surgeon: Laurey Morale, MD;  Location: [facility] ENDOSCOPY;  Service: Cardiovascular;  Laterality: N/A;  . CATARACT EXTRACTION W/ INTRAOCULAR LENS  IMPLANT, BILATERAL Bilateral   . COLONOSCOPY WITH PROPOFOL N/A 11/22/2017   Procedure: COLONOSCOPY WITH PROPOFOL;  Surgeon: Iva Boop, MD;  Location: [facility] ENDOSCOPY;  Service: Endoscopy;  Laterality: N/A;  . DILATION AND CURETTAGE OF UTERUS    . TEE WITHOUT CARDIOVERSION N/A 06/19/2015   Procedure: TRANSESOPHAGEAL ECHOCARDIOGRAM (TEE);  Surgeon: Laurey Morale, MD;  Location: [facility] ENDOSCOPY;  Service: Cardiovascular;  Laterality: N/A;  . TOTAL KNEE ARTHROPLASTY Left 04/25/2017   Procedure: TOTAL KNEE ARTHROPLASTY;  Surgeon: [name], MD;  Location: [facility] OR;  Service: Orthopedics;  Laterality: Left;  [name] VAGINAL HYSTERECTOMY  1979   ovaries intact     Current Outpatient Medications  Medication Sig Dispense Refill  . acetaminophen (TYLENOL) 500 MG tablet Take 500 mg by mouth 2 (two) times daily as needed for moderate pain or headache.     . Alpha-D-Galactosidase (BEANO PO) Take 1 tablet by mouth daily as needed (for gas).    . budesonide (ENTOCORT EC) 3 MG 24 hr capsule Take 3 capsules (9 mg total) by mouth daily. 90 capsule 2  . calcium carbonate (TUMS - DOSED IN MG ELEMENTAL CALCIUM) 500 MG chewable tablet Chew 1 tablet by mouth 3 (three) times daily as needed for indigestion or heartburn.     . Carboxymethylcellul-Glycerin ([employer] LUBRICATING/DRY EYE OP) Place 1-2 drops into both eyes daily as needed (for dry eyes).    [name] digoxin (LANOXIN) 0.25 MG tablet Take 0.5 tablets (0.125 mg total) by mouth daily. 45 tablet 3  . diphenoxylate-atropine (LOMOTIL) 2.5-0.025 MG tablet Take 1 tablet by mouth 2 (two)  times daily as needed for diarrhea or loose stools.    [name] ELIQUIS 2.5 MG TABS tablet TAKE 1 TABLET BY MOUTH TWICE A DAY 180 tablet 1  . furosemide (LASIX) 40 MG tablet Take 40 mg by mouth daily. May take additional 40 mg if needed for weight gain. 60 tablet 3  . KLOR-CON M20 20 MEQ tablet TAKE 1 TABLET BY MOUTH TWICE A DAY 60 tablet 3  . loperamide (IMODIUM) 2 MG capsule Take 2 mg by mouth as needed for diarrhea or loose stools.    . metoprolol succinate (TOPROL-XL) 50 MG 24 hr tablet Take 1 tablet (50 mg total) by mouth daily. Take with or immediately following a meal. 90 tablet 3  . ranitidine (ZANTAC) 150 MG tablet Take 150 mg by mouth daily.     [name]  sertraline (ZOLOFT) 50 MG tablet Take 50 mg by mouth daily 180 tablet 3   No current facility-administered medications for this visit.     Allergies:   Ace inhibitors; Warfarin sodium; Codeine; Delsym [dextromethorphan polistirex er]; Lactose intolerance (gi); Phenylephrine; and Sulfamethoxazole-trimethoprim    Social History:  The patient  reports that she has never smoked. She has never used smokeless tobacco. She reports that she does not drink alcohol or use drugs.   Family History:  The patient's family history includes Colon cancer in her father; Other in her father; Tuberculosis in her mother.    ROS:  Please see the history of present illness.   Otherwise, review of systems are positive for none.   All other systems are reviewed and negative.    PHYSICAL EXAM: VS:  BP 114/68   Pulse 70   Ht 5\' 5"  (1.651 m)   Wt 159 lb (72.1 kg)   BMI 26.46 kg/m  , BMI Body mass index is 26.46 kg/m. GENERAL:  Well appearing HEENT: Pupils equal round and reactive, fundi not visualized, oral mucosa unremarkable NECK:  No jugular venous distention, waveform within normal limits, carotid upstroke brisk and symmetric, no bruits, no thyromegaly LYMPHATICS:  No cervical adenopathy LUNGS:  Clear to auscultation bilaterally HEART:  Irregularly  irregular.  PMI not displaced or sustained,S1 and S2 within normal limits, no S3, no S4, no clicks, no rubs, no murmurs ABD:  Flat, positive bowel sounds normal in frequency in pitch, no bruits, no rebound, no guarding, no midline pulsatile mass, no hepatomegaly, no splenomegaly EXT:  2 plus pulses throughout, no edema, no cyanosis no clubbing SKIN:  No rashes no nodules NEURO:  Cranial nerves II through XII grossly intact, motor grossly intact throughout PSYCH:  Cognitively intact, oriented to person place and time   EKG:  EKG is ordered today. 02/01/2018: Atrial fibrillation.  Rate 70 bpm.  Right axis deviation.  IVCD.  Echo 04/30/17: Study Conclusions  - Left ventricle: The cavity size was moderately dilated. Wall   thickness was normal. Systolic function was severely reduced. The   estimated ejection fraction was 15%. No mural thrombus with   Definity contrast. Diffuse hypokinesis. The study is not   technically sufficient to allow evaluation of LV diastolic   function. - Aortic valve: Mildly calcified leaflets. There was no stenosis.   There was no regurgitation. - Mitral valve: Mildly thickened leaflets . There was moderate   regurgitation. - Left atrium: Severely dilated. - Right ventricle: The cavity size was mildly dilated. Mildly   reduced systolic function. - Right atrium: Severely dilated. - Tricuspid valve: There was moderate to severe regurgitation. - Pulmonary arteries: PA peak pressure: 45 mm Hg (S). - Inferior vena cava: The vessel was dilated. The respirophasic   diameter changes were blunted (< 50%), consistent with elevated   central venous pressure. - Pericardium, extracardiac: There was a left pleural effusion.  Echo 08/03/17: Study Conclusions  - Left ventricle: The cavity size was normal. Wall thickness was   increased in a pattern of mild LVH. Systolic function was   severely reduced. The estimated ejection fraction was in the   range of 20% to 25%.  Diffuse hypokinesis. There is dyskinesis of   the anteroseptal myocardium. The study is not technically   sufficient to allow evaluation of LV diastolic function. - Aortic root: The aortic root was normal in size. - Mitral valve: Calcified annulus. - Left atrium: The atrium was moderately dilated. - Pulmonary arteries:  Systolic pressure was mildly to moderately   increased.  Impressions:  - Diffuse hypokinesis with septal dyskinesis; overall severely   reduced LV systolic function; mild LVH; moderate LAE; mild TR;   mild to moderate pulmonary hypertension.   Recent Labs: 04/27/2017: Magnesium 2.1 05/01/2017: B Natriuretic Peptide 4,284.7; TSH 2.685 05/18/2017: NT-Pro BNP 6,071 08/22/2017: ALT 6; BUN 14; Creatinine, Ser 0.95; Hemoglobin 13.4; Platelets 292.0; Potassium 4.1; Sodium 138    Lipid Panel    Component Value Date/Time   CHOL 209 (H) 01/30/2017 1451   TRIG 151.0 (H) 01/30/2017 1451   HDL 55.50 01/30/2017 1451   CHOLHDL 4 01/30/2017 1451   VLDL 30.2 01/30/2017 1451   LDLCALC 124 (H) 01/30/2017 1451   LDLDIRECT 56.0 10/17/2014 0836      Wt Readings from Last 3 Encounters:  02/01/18 159 lb (72.1 kg)  01/04/18 156 lb (70.8 kg)  11/22/17 153 lb 12.8 oz (69.8 kg)      ASSESSMENT AND PLAN:  # Chronic systolic and diastolic heart failure: LVEF 15% improved to 20-25%.  She is not on an ACE-I/ARB due to hypotension.  She is euvolemic and feeling well.  Continue digoxin, metoprolol and lasix.  Ms. Perot is not interested in ICD.  She will return for a digoxin level.   # Hypertension:  BP stable.  Continue metoprolol.  # Chronic atrial fibrillation: Continue Eliquis, digoxin, and metoprolol.  She will return for a digoxin level, CMP, CBC, and lipids.  Although her age is greater than 80, her weight is not less than 60 kg and her creatinine is less than 1.5.  Therefore she should be on 5 mg of Eliquis twice daily.  We talked about increasing the dose.  However she is  hesitant because she had spontaneous bleeding into her knee.  Therefore we will continue on 2.5 mg.  # Depression: Suggested she follow up with her PCP and consider seeing psychiatry for other medicaiton options.  She doesn't want counseling.   Current medicines are reviewed at length with the patient today.  The patient has concerns regarding medicines.  The following changes have been made:  None    Labs/ tests ordered today include:   No orders of the defined types were placed in this encounter.    Disposition:   FU with Anessia Oakland C. Duke Salvia, MD, [facility] in 6 months     Signed, Taelynn Mcelhannon C. Duke Salvia, MD, [facility]  02/01/2018 11:22 AM    [facility]

## 2018-02-05 LAB — CBC WITH DIFFERENTIAL/PLATELET
Basophils Absolute: 0.1 10*3/uL (ref 0.0–0.2)
Basos: 1 %
EOS (ABSOLUTE): 0.2 10*3/uL (ref 0.0–0.4)
Eos: 3 %
Hematocrit: 38.4 % (ref 34.0–46.6)
Hemoglobin: 12.5 g/dL (ref 11.1–15.9)
Immature Grans (Abs): 0 10*3/uL (ref 0.0–0.1)
Immature Granulocytes: 1 %
Lymphocytes Absolute: 1.7 10*3/uL (ref 0.7–3.1)
Lymphs: 21 %
MCH: 30 pg (ref 26.6–33.0)
MCHC: 32.6 g/dL (ref 31.5–35.7)
MCV: 92 fL (ref 79–97)
Monocytes Absolute: 0.6 10*3/uL (ref 0.1–0.9)
Monocytes: 7 %
Neutrophils Absolute: 5.4 10*3/uL (ref 1.4–7.0)
Neutrophils: 67 %
Platelets: 320 10*3/uL (ref 150–450)
RBC: 4.16 x10E6/uL (ref 3.77–5.28)
RDW: 12.3 % (ref 12.3–15.4)
WBC: 8 10*3/uL (ref 3.4–10.8)

## 2018-02-05 LAB — COMPREHENSIVE METABOLIC PANEL
ALT: 10 IU/L (ref 0–32)
AST: 14 IU/L (ref 0–40)
Albumin/Globulin Ratio: 1.7 (ref 1.2–2.2)
Albumin: 3.9 g/dL (ref 3.5–4.7)
Alkaline Phosphatase: 87 IU/L (ref 39–117)
BUN/Creatinine Ratio: 19 (ref 12–28)
BUN: 15 mg/dL (ref 8–27)
Bilirubin Total: 0.7 mg/dL (ref 0.0–1.2)
CO2: 25 mmol/L (ref 20–29)
Calcium: 9.4 mg/dL (ref 8.7–10.3)
Chloride: 95 mmol/L — ABNORMAL LOW (ref 96–106)
Creatinine, Ser: 0.77 mg/dL (ref 0.57–1.00)
GFR calc Af Amer: 82 mL/min/{1.73_m2} (ref >59)
GFR calc non Af Amer: 71 mL/min/{1.73_m2} (ref >59)
Globulin, Total: 2.3 g/dL (ref 1.5–4.5)
Glucose: 84 mg/dL (ref 65–99)
Potassium: 4.6 mmol/L (ref 3.5–5.2)
Sodium: 137 mmol/L (ref 134–144)
Total Protein: 6.2 g/dL (ref 6.0–8.5)

## 2018-02-05 LAB — LIPID PANEL
Chol/HDL Ratio: 2.7 ratio (ref 0.0–4.4)
Cholesterol, Total: 156 mg/dL (ref 100–199)
HDL: 58 mg/dL (ref >39)
LDL Calculated: 73 mg/dL (ref 0–99)
Triglycerides: 124 mg/dL (ref 0–149)
VLDL Cholesterol Cal: 25 mg/dL (ref 5–40)

## 2018-02-05 LAB — DIGOXIN LEVEL: Digoxin, Serum: 1.1 ng/mL — ABNORMAL HIGH (ref 0.5–0.9)

## 2018-02-16 ENCOUNTER — Other Ambulatory Visit: Payer: Self-pay | Admitting: Cardiovascular Disease

## 2018-02-23 ENCOUNTER — Other Ambulatory Visit: Payer: Self-pay | Admitting: Cardiovascular Disease

## 2018-03-07 ENCOUNTER — Other Ambulatory Visit: Payer: Self-pay | Admitting: Internal Medicine

## 2018-03-07 NOTE — Telephone Encounter (Signed)
I spoke with [name] and informed her of what [name] put in her MyChart message. She will go back to the 9mg  a day of her budesonide and she will call us early January for a late January appointment. I have sent in her refill.

## 2018-03-07 NOTE — Telephone Encounter (Signed)
I spoke with [name] and she doesn't need this refill currently, pharmacy just sent it automatically to [place]. However when on the phone I ask how she was taking it because she was suppose to call you back when an update in October. She said after her visit with you in September she started taking her Budesonide 3mg  one twice a day because she couldn't remember the third dose. She is doing well on this she said , having 2-3 loose stools on and off, mostly formed. Please advise if to continue one BID or take 2 once daily so its easier to remember. Thanks.

## 2018-03-07 NOTE — Telephone Encounter (Signed)
[name] please review, I am not sure who called him.  I do not see a note, I haven't spoken to him.  Thanks

## 2018-05-15 NOTE — Telephone Encounter (Signed)
Dr Leone Payor can you please review [name] is off this week? thanks

## 2018-05-24 ENCOUNTER — Ambulatory Visit: Payer: Medicare Other | Admitting: Physician Assistant

## 2018-05-24 ENCOUNTER — Encounter: Payer: Self-pay | Admitting: Physician Assistant

## 2018-05-24 VITALS — BP 106/68 | HR 94 | Ht 65.0 in | Wt 163.0 lb

## 2018-05-24 DIAGNOSIS — K52839 Microscopic colitis, unspecified: Secondary | ICD-10-CM | POA: Diagnosis not present

## 2018-05-24 DIAGNOSIS — R197 Diarrhea, unspecified: Secondary | ICD-10-CM

## 2018-05-24 NOTE — Progress Notes (Addendum)
Subjective:    Patient ID: [name], female    DOB: 11/26/1930, 85 y.o.   MRN: [id number]  HPI [name] is a very nice 85 year old white female, established with Dr. Leone PayorGessner..  She was diagnosed with microscopic colitis after colonoscopy in August 2019 and has been on budesonide 9 mg daily. She comes in today for follow-up.  She says she has been taking the budesonide since September and that initially her symptoms were much improved.  Before initiating therapy she was having 10 or 12 loose to liquid bowel movements per day, and since being on the budesonide she generally will have 4-5 bowel movements.  Over the past couple of months she has not seen any improvement and continues to have 4-5 bowel movements per day which are loose.  She feels that she does not evacuate her bowels well at times.  She has not seen any blood.  She denies any abdominal pain or cramping or discomfort no bloating or nausea.  She does get some urgency at times which is inconvenient. She is generally been taking 1-2 Imodium daily until after she has had a couple of episodes of diarrhea to take this.  At one point a couple of months ago dose was decreased to 6 mg/day and she is not sure that felt any different on 6 mg then she does on 9 mg. Other medical problems include atrial fibrillation for which she is on Eliquis, congestive heart failure, nonischemic cardiomyopathy and depression.  Review of Systems Pertinent positive and negative review of systems were noted in the above HPI section.  All other review of systems was otherwise negative.  Outpatient Encounter Medications as of 05/24/2018  Medication Sig  . acetaminophen (TYLENOL) 500 MG tablet Take 500 mg by mouth 2 (two) times daily as needed for moderate pain or headache.   . budesonide (ENTOCORT EC) 3 MG 24 hr capsule TAKE 3 CAPSULES (9 MG TOTAL) BY MOUTH DAILY.  . calcium carbonate (TUMS - DOSED IN MG ELEMENTAL CALCIUM) 500 MG chewable tablet Chew 1 tablet by  mouth 3 (three) times daily as needed for indigestion or heartburn.   . Carboxymethylcellul-Glycerin ([employer] LUBRICATING/DRY EYE OP) Place 1-2 drops into both eyes daily as needed (for dry eyes).  [name]. digoxin (LANOXIN) 0.25 MG tablet Take 0.5 tablets (0.125 mg total) by mouth daily.  . diphenoxylate-atropine (LOMOTIL) 2.5-0.025 MG tablet Take 1 tablet by mouth 2 (two) times daily as needed for diarrhea or loose stools.  [name]. ELIQUIS 2.5 MG TABS tablet TAKE 1 TABLET BY MOUTH TWICE A DAY  . furosemide (LASIX) 40 MG tablet TAKE 1 TABLET BY MOUTH TWICE A DAY  . KLOR-CON M20 20 MEQ tablet TAKE 1 TABLET BY MOUTH TWICE A DAY  . loperamide (IMODIUM) 2 MG capsule Take 2 mg by mouth as needed for diarrhea or loose stools.  . metoprolol succinate (TOPROL-XL) 50 MG 24 hr tablet Take 1 tablet (50 mg total) by mouth daily. Take with or immediately following a meal.  . ranitidine (ZANTAC) 150 MG tablet Take 150 mg by mouth as needed for heartburn.   . sertraline (ZOLOFT) 50 MG tablet Take 50 mg by mouth daily  . [DISCONTINUED] Alpha-D-Galactosidase (BEANO PO) Take 1 tablet by mouth daily as needed (for gas).  . [DISCONTINUED] furosemide (LASIX) 40 MG tablet Take 40 mg by mouth daily. May take additional 40 mg if needed for weight gain.   No facility-administered encounter medications on file as of 05/24/2018.    Allergies  Allergen Reactions  . Ace Inhibitors Cough  . Warfarin Sodium Other (See Comments)    DOSE RELATED PHARMACOLOGIC EFFECT "bleed out"  . Codeine Other (See Comments)    sedation  . Delsym [Dextromethorphan Polistirex Er] Other (See Comments)    dizziness  . Lactose Intolerance (Gi) Diarrhea  . Phenylephrine Palpitations and Other (See Comments)    Nasal spray- "likely increase in nasal congestion".   . Sulfamethoxazole-Trimethoprim Nausea And Vomiting    GI intolerance.   Patient Active Problem List   Diagnosis Date Noted  . Chronic diarrhea 09/07/2017  . Hoarseness of voice 09/07/2017    . Total knee replacement status, left 04/25/17 05/06/2017  . Acute combined systolic and diastolic heart failure (HCC) 05/02/2017  . Cardiogenic shock (HCC) 05/02/2017  . Primary osteoarthritis of left knee 04/25/2017  . DJD (degenerative joint disease) 04/25/2017  . Healthcare maintenance 02/08/2017  . Vitamin D deficiency 02/08/2017  . Microscopic colitis 09/27/2016  . IBS (irritable bowel syndrome) 08/31/2016  . At risk for falling 01/07/2016  . Chronic combined systolic and diastolic heart failure (HCC)   . Rectus sheath hematoma- no anticoagulation   . Atrial fibrillation with RVR- CHADs VASc=4 06/18/2015  . Diarrhea 06/12/2015  . Advance care planning 10/23/2014  . Leg edema 06/27/2012  . Medicare annual wellness visit, subsequent 03/27/2012  . Depression 03/05/2012  . EDEMA 06/07/2010  . Osteoporosis 05/23/2010  . KNEE PAIN, LEFT 03/02/2010  . LBBB (left bundle branch block) 10/28/2008  . POST MI SEPTAL DEFECT 06/24/2008  . Cough 06/24/2008  . Nonischemic cardiomyopathy (HCC) 05/30/2008  . ATRIAL FIBRILLATION 05/30/2008  . UNSPECIFIED HEMORRHAGE 05/30/2008  . HYPONATREMIA, HX OF 05/30/2008   Social History   Socioeconomic History  . Marital status: Married    Spouse name: Not on file  . Number of children: 2  . Years of education: Not on file  . Highest education level: Not on file  Occupational History  . Occupation: Retired    Associate Professor: RETIRED  Social Needs  . Financial resource strain: Not on file  . Food insecurity:    Worry: Not on file    Inability: Not on file  . Transportation needs:    Medical: Not on file    Non-medical: Not on file  Tobacco Use  . Smoking status: Never Smoker  . Smokeless tobacco: Never Used  Substance and Sexual Activity  . Alcohol use: No    Alcohol/week: 0.0 standard drinks  . Drug use: No  . Sexual activity: Never  Lifestyle  . Physical activity:    Days per week: Not on file    Minutes per session: Not on file  .  Stress: Not on file  Relationships  . Social connections:    Talks on phone: Not on file    Gets together: Not on file    Attends religious service: Not on file    Active member of club or organization: Not on file    Attends meetings of clubs or organizations: Not on file    Relationship status: Not on file  . Intimate partner violence:    Fear of current or ex partner: Not on file    Emotionally abused: Not on file    Physically abused: Not on file    Forced sexual activity: Not on file  Other Topics Concern  . Not on file  Social History Narrative   Retired, former [employer]   Married, 1953   Lives with husband   2  grown children, one in [place], one out of state   Tobacco Use - No.    Drug Use - no   teaches Sunday school, reading    Ms. Dinkins's family history includes Colon cancer in her father; Other in her father; Tuberculosis in her mother.      Objective:    Vitals:   05/24/18 0928  BP: 106/68  Pulse: 94    Physical Exam; well-developed elderly white female in no acute distress, very pleasant, accompanied by her husband.  Height 5 foot 5, weight 163, BMI 27.1.  HEENT; nontraumatic normocephalic EOMI PERRLA sclera anicteric, Oral mucosa moist, Cardiovascular; irregular rate and rhythm with S1-S2.  Pulmonary; clear bilaterally, Abdomen ;soft nontender, no palpable mass or hepatosplenomegaly bowel sounds are active.  Rectal ;exam not done, Extremities; no clubbing cyanosis or edema skin warm dry, Neuropsych ;alert and oriented, grossly nonfocal mood and affect appropriate       Assessment & Plan:   #[phone] year old white female with microscopic colitis who has been on budesonide 9 mg daily over the past 5 months.  She had initial significant improvement in symptoms continues to have 4-5 loose bowel movements per day with intermittent urgency. #2 underlying IBS #3.  Atrial fibrillation #4.  Chronic anticoagulation-on Eliquis #5.  Congestive heart  failure #6.  Nonischemic cardiomyopathy  Plan; Patient would like a trial of coming off of budesonide she can assess how much benefit she is getting from it at this point.  I think that is reasonable. Will stop budesonide, she will continue to use Imodium and was advised she could take up to 6 daily.  Advise she go ahead and take an Imodium before going out. She will call back in a couple of weeks with a progress report.  If diarrhea significantly worsens, may want to give her a tapered course of prednisone rather than restarting budesonide. , Also note patient is on Zoloft/SSRI.  These medications have been associated with microscopic colitis.  Depending on her course over the next few months we may need to discuss alternatives. Amy Oswald HillockS Esterwood PA-C 05/24/2018   Cc: Joaquim Namuncan, Graham S, MD  Agree with Ms. Oswald HillockEsterwood's assessment and plan.  I think she would benefit from having a f/u me in March. Will arrange. Iva Booparl E. Gessner, MD, Clementeen GrahamFACG

## 2018-05-24 NOTE — Patient Instructions (Signed)
Normal BMI (Body Mass Index- based on height and weight) is between 23 and 30. Your BMI today is Body mass index is 27.12 kg/m. [name] Please consider follow up  regarding your BMI with your Primary Care Provider.  Trial of stopping Budesonide   Continue Imodium as needed  Please call us back in 7-10 days with an update on how you are doing with your diarrhea  Thank you for entrusting me with your care and choosing [facility].  Amy Esterwood PA

## 2018-05-25 ENCOUNTER — Other Ambulatory Visit: Payer: Self-pay | Admitting: Family Medicine

## 2018-05-25 NOTE — Telephone Encounter (Signed)
Electronic refill request. Lomotil Last office visit:   08/22/17 Last Filled:

## 2018-05-28 ENCOUNTER — Telehealth: Payer: Self-pay

## 2018-05-28 NOTE — Telephone Encounter (Signed)
I called and have left a detailed message to call me back so we can get her on the books for Dr Stan Head in 4-6 weeks.

## 2018-05-28 NOTE — Telephone Encounter (Signed)
-----   Message from Iva Boop, MD sent at 05/27/2018 11:44 AM EST ----- Regarding: f/u appt She saw Amy last week  Amy asked her to call back but I think it would be good idea for her to f/u me in 4-6 weeks if I have an opening  Please arrange if patient agrees  Iva Boop, MD, [facility]

## 2018-05-29 NOTE — Telephone Encounter (Signed)
[name] called back and we set her up an appointment for 06/28/2018 at 11:00AM to see Dr Leone Payor.

## 2018-06-05 ENCOUNTER — Encounter: Payer: Self-pay | Admitting: Family Medicine

## 2018-06-05 ENCOUNTER — Telehealth: Payer: Self-pay | Admitting: Family Medicine

## 2018-06-05 ENCOUNTER — Ambulatory Visit: Payer: Medicare Other | Admitting: Family Medicine

## 2018-06-05 VITALS — BP 110/62 | HR 86 | Temp 98.2°F | Ht 65.0 in | Wt 161.2 lb

## 2018-06-05 DIAGNOSIS — I482 Chronic atrial fibrillation, unspecified: Secondary | ICD-10-CM | POA: Diagnosis not present

## 2018-06-05 DIAGNOSIS — Z7189 Other specified counseling: Secondary | ICD-10-CM

## 2018-06-05 LAB — BASIC METABOLIC PANEL WITH GFR
BUN: 17 mg/dL (ref 6–23)
CO2: 34 meq/L — ABNORMAL HIGH (ref 19–32)
Calcium: 9.6 mg/dL (ref 8.4–10.5)
Chloride: 97 meq/L (ref 96–112)
Creatinine, Ser: 0.91 mg/dL (ref 0.40–1.20)
GFR: 58.71 mL/min — ABNORMAL LOW
Glucose, Bld: 93 mg/dL (ref 70–99)
Potassium: 3.7 meq/L (ref 3.5–5.1)
Sodium: 138 meq/L (ref 135–145)

## 2018-06-05 MED ORDER — FUROSEMIDE 40 MG PO TABS: 80.0000 mg | ORAL_TABLET | Freq: Every day | ORAL | Status: DC

## 2018-06-05 MED ORDER — POTASSIUM CHLORIDE CRYS ER 20 MEQ PO TBCR: 20.0000 meq | EXTENDED_RELEASE_TABLET | Freq: Every day | ORAL | Status: DC

## 2018-06-05 NOTE — Patient Instructions (Addendum)
Go to the lab on the way out.  We'll contact you with your lab report.  In the meantime, take 2 lasix tabs in the AM along with 1 potassium tab.  See if that helps.    Update me in a few days.   Take care.  Glad to see you.

## 2018-06-05 NOTE — Telephone Encounter (Signed)
Pt dropped off Living Will/HCPOA. Made copies, scanned in chart, and placed on the cart to be delivered to Dr. Para March.

## 2018-06-05 NOTE — Progress Notes (Signed)
Her diarrhea is better in the meantime.  She is taking imodium prn but is off budesonide.    She is taking lasix 40mg  BID and K dur QD (she had cut back to 1 K dur dose a day).  She has less UOP with each dose of lasix now, compared to prev.  She has inc in weight.  Weight has gradually increased.  Diet isn't different.    No exertional CP, SOB, BLE edema.  Some fatigue.  Sleeps on 1 pillow.  She isn't lightheaded.    She is taking TUMS prn, and that usually helps for occasional indigestion.  D/w pt about prn use of pepcid prn.    PMH and SH reviewed  ROS: Per HPI unless specifically indicated in ROS section   Meds, vitals, and allergies reviewed.   GEN: nad, alert and oriented HEENT: mucous membranes moist NECK: supple w/o LA CV: IRR.  Not tachy PULM: ctab, no inc wob ABD: soft, +bs EXT: no edema SKIN: no acute rash

## 2018-06-06 ENCOUNTER — Encounter: Payer: Self-pay | Admitting: Family Medicine

## 2018-06-06 NOTE — Assessment & Plan Note (Signed)
Weight is gradually increased.  She does not have significant change in diet. She has less urine output with each dose of Lasix now. Unclear if this is related to change in kidney function, decrease effectiveness of Lasix, or lack of fluid to not need as much diuresis in the first place. I did not stop her Lasix yet.  She had already cut back on her potassium, on her own. Check routine labs today.  See notes on labs. In the meantime, take 2 lasix tabs in the AM along with 1 potassium tab.  She will see if she notes an increase in urine output with that.  Routine cautions given to patient.  She will update me in a few days.   Okay for outpatient follow-up. >25 minutes spent in face to face time with patient, >50% spent in counselling or coordination of care.

## 2018-06-06 NOTE — Telephone Encounter (Signed)
Noted. Thanks.

## 2018-06-06 NOTE — Assessment & Plan Note (Signed)
Advance directive- husband designated if patient were incapacitated. See scanned living will.

## 2018-06-07 ENCOUNTER — Encounter: Payer: Self-pay | Admitting: Family Medicine

## 2018-06-22 ENCOUNTER — Encounter: Payer: Self-pay | Admitting: Family Medicine

## 2018-06-28 ENCOUNTER — Ambulatory Visit: Payer: Medicare Other | Admitting: Internal Medicine

## 2018-07-02 ENCOUNTER — Other Ambulatory Visit: Payer: Self-pay

## 2018-07-02 ENCOUNTER — Other Ambulatory Visit: Payer: Self-pay | Admitting: Cardiovascular Disease

## 2018-07-02 MED ORDER — APIXABAN 2.5 MG PO TABS: 2.5000 mg | ORAL_TABLET | Freq: Two times a day (BID) | ORAL | 1 refills | Status: DC

## 2018-07-02 NOTE — Telephone Encounter (Signed)
I spoke to the patient  Agreed w/ resuming budesonide  Not sure wjat tiny dark things in TP are but she will monitor - do not think they are eggs, parasites, etx

## 2018-07-02 NOTE — Telephone Encounter (Signed)
Pt is a 85 yr old female who saw Dr. Duke Salvia on 02/01/18. Last noted weight on 06/05/18 was 73.1 Kg during her PCP office visit, SCr that day was 0.91. Will send in 2.5mg  BID as per Dr. Duke Salvia OV note from 02/01/2018 pt should be on 5mg  BID, but pt hesitant to increase dose.

## 2018-07-17 ENCOUNTER — Encounter: Payer: Self-pay | Admitting: Cardiovascular Disease

## 2018-07-17 ENCOUNTER — Telehealth (INDEPENDENT_AMBULATORY_CARE_PROVIDER_SITE_OTHER): Payer: Medicare Other | Admitting: Cardiovascular Disease

## 2018-07-17 ENCOUNTER — Other Ambulatory Visit: Payer: Self-pay

## 2018-07-17 DIAGNOSIS — I4821 Permanent atrial fibrillation: Secondary | ICD-10-CM | POA: Diagnosis not present

## 2018-07-17 DIAGNOSIS — I5042 Chronic combined systolic (congestive) and diastolic (congestive) heart failure: Secondary | ICD-10-CM

## 2018-07-17 NOTE — Progress Notes (Signed)
Virtual Visit via Video Note   This visit type was conducted due to national recommendations for restrictions regarding the COVID-19 Pandemic (e.g. social distancing) in an effort to limit this patient's exposure and mitigate transmission in our community.  Due to her co-morbid illnesses, this patient is at least at moderate risk for complications without adequate follow up.  This format is felt to be most appropriate for this patient at this time.  All issues noted in this document were discussed and addressed.  A limited physical exam was performed with this format.  Please refer to the patient's chart for her consent to telehealth for [facility]   Evaluation Performed:  Follow-up visit  Date:  07/17/2018   ID:  IMONIE SPELL, DOB 08-12-1930, MRN [id number]  Patient Location: Home  Provider Location: Office  PCP:  [name], MD  Cardiologist:  [name], MD  Electrophysiologist:  None   Chief Complaint:  Follow up  History of Present Illness:    [name] is an 85 y.o. female who presents via video conferencing for a telehealth visit today.    LASHIEKA CABRIALES is an 85 y.o. female with chronic atrial fibrillation, chronic systolic and diastolic heart failure, left bundle branch block, spontaneous retroperitoneal bleed on Coumadin who presents for follow-up.  She is previously seen by Dr. Barrington Ell[facility] clinic 03/2017 and cleared for surgery.  At that time she was in atrial fibrillation with well-controlled rates.  She underwent left knee replacement 04/2017.  Her hospital course was complicated by atrial fibrillation with rapid ventricular response and acute systolic and diastolic heart failure.  Echo that admission revealed LVEF 15% which was down from 50% in 2017.  She was diuresed with IV Lasix and had a Lexiscan Myoview without evidence of ischemia.  She failed amiodarone and was started on digoxin.  She had a repeat echocardiogram 07/2017 that revealed LVEF 20 to  25%.  Ms. Tressler followed up with [name], PA, ad was doing well on 05/2017.  She was struggling with anxiety but asymptomatic from a cardiac standpoint.  She then followed up with [name], PA-C on 06/02/17 and reported episodes of feeling fluttering in her chest.  She also noted that her appetite was poor and that she had not been very active.  She was thought to be dry and Lasix was switched to once a day.  Losartan was discontinued due to low BP and fatigue.  Her fatigue has improved.  She also struggled with diarrhea and stopped taking her digoxin in hopes that this would help.  However the diarrhea did not improve.  Digoxin was resumed.  She has declined ICD.  Ms. Furmanski saw her PCP 06/05/2018 and was in atrial fibrillation at that time.  She noted that her weight has been increasing and that her urinary response to Lasix was less than usual.  She temporarily doubled her Lasix and had a return to her baseline weight of around 160 pounds.  Renal function remained stable after making that change.  She typically takes Lasix 40 mg a day but takes an extra dose as needed.  Lately she has not had any lower extremity edema and has not needed to do so.  She has some mild shortness of breath with exertion.  She denies any chest pain or pressure.  She has not had any palpitations lately.  She does note some dizziness with positional changes and has to be careful when getting up quickly.  She also  feels unsteady on her feet and had a fall when she tried to turn too quickly.  She did not injure her head.  She uses a cane outside of the home but does not use it in her home.  She notes a lot of fatigue and worsening of her depression symptoms lately.  Her PCP recommended that she see a psychiatrist but she has not been ready to do so.  She thinks that her body is immune to her Zoloft.  When her depression is really bad she takes an extra dose which does seem to help.  However he makes her gait instability  worse.  Her blood pressure at home is been in the 110s to the 130s.  Typically is less than 120/70.  The patient does not have symptoms concerning for COVID-19 infection (fever, chills, cough, or new shortness of breath).    Past Medical History:  Diagnosis Date   Acute combined systolic and diastolic heart failure (HCC) 05/02/2017   Anxiety    Arthritis    "knees" (06/18/2015)   Atrial fibrillation (HCC)    h/o sig bleed on coumadin   Atrial fibrillation with RVR (HCC) 06/18/2015   Cardiogenic shock (HCC) 05/02/2017   CHF (congestive heart failure) (HCC)    Complication of anesthesia 2010   "w/cardiac cath; got into a psychotic state for ~ 3 days; got me out w/Valium"   Depression    "I have it off and on; not as often as I've gotten older" (06/18/2015)   Diverticulosis    descending and sigmoid colon--Dr. Jarold MottoPatterson   DJD (degenerative joint disease) of knee    left   Dysrhythmia    GERD (gastroesophageal reflux disease)    Insomnia     off chronic ambien 5mg  as of 10/11, rare/episodic use since then  DCM/CHF   Lymphocytic colitis 09/27/2016   Nonischemic cardiomyopathy (HCC)    normal coronaries, EF 15-20% 04/2008, EF 50-55% 2015   Osteoporosis    on DXA 05/2010   Sciatica    Right   Past Surgical History:  Procedure Laterality Date   BIOPSY  11/22/2017   Procedure: BIOPSY;  Surgeon: Iva BoopGessner, Carl E, MD;  [facility];  Service: Endoscopy;;   CARDIAC CATHETERIZATION  2010   CARDIOVERSION N/A 06/19/2015   Procedure: CARDIOVERSION;  Surgeon: Laurey Moralealton S McLean, MD;  [facility];  Service: Cardiovascular;  Laterality: N/A;   CATARACT EXTRACTION W/ INTRAOCULAR LENS  IMPLANT, BILATERAL Bilateral    COLONOSCOPY WITH PROPOFOL N/A 11/22/2017   Procedure: COLONOSCOPY WITH PROPOFOL;  Surgeon: Iva BoopGessner, Carl E, MD;  Location: [facility] ENDOSCOPY;  Service: Endoscopy;  Laterality: N/A;   DILATION AND CURETTAGE OF UTERUS     TEE WITHOUT CARDIOVERSION N/A  06/19/2015   Procedure: TRANSESOPHAGEAL ECHOCARDIOGRAM (TEE);  Surgeon: Laurey Moralealton S McLean, MD;  [facility];  Service: Cardiovascular;  Laterality: N/A;   TOTAL KNEE ARTHROPLASTY Left 04/25/2017   Procedure: TOTAL KNEE ARTHROPLASTY;  Surgeon: Marcene Corningalldorf, Peter, MD;  Location: [facility] OR;  Service: Orthopedics;  Laterality: Left;   VAGINAL HYSTERECTOMY  1979   ovaries intact     Current Meds  Medication Sig   acetaminophen (TYLENOL) 500 MG tablet Take 500 mg by mouth 2 (two) times daily as needed for moderate pain or headache.    apixaban (ELIQUIS) 2.5 MG TABS tablet Take 1 tablet (2.5 mg total) by mouth 2 (two) times daily.   Budesonide ER 9 MG TB24 Take by mouth daily.   calcium carbonate (TUMS - DOSED  IN MG ELEMENTAL CALCIUM) 500 MG chewable tablet Chew 1 tablet by mouth 3 (three) times daily as needed for indigestion or heartburn.    Carboxymethylcellul-Glycerin ([employer] LUBRICATING/DRY EYE OP) Place 1-2 drops into both eyes daily as needed (for dry eyes).   digoxin (LANOXIN) 0.25 MG tablet Take 0.5 tablets (0.125 mg total) by mouth daily.   famotidine (PEPCID) 20 MG tablet Take 20 mg by mouth daily as needed for heartburn or indigestion.   furosemide (LASIX) 40 MG tablet Take 40 mg by mouth as directed. 1 to 2 tablets daily   loperamide (IMODIUM) 2 MG capsule Take 2 mg by mouth as needed for diarrhea or loose stools.   metoprolol succinate (TOPROL-XL) 50 MG 24 hr tablet Take 1 tablet (50 mg total) by mouth daily. Take with or immediately following a meal.   potassium chloride SA (K-DUR,KLOR-CON) 20 MEQ tablet Take 20 mEq by mouth 2 (two) times daily.   sertraline (ZOLOFT) 50 MG tablet Take 50 mg by mouth daily     Allergies:   Ace inhibitors; Warfarin sodium; Codeine; Delsym [dextromethorphan polistirex er]; Lactose intolerance (gi); Phenylephrine; and Sulfamethoxazole-trimethoprim   Social History   Tobacco Use   Smoking status: Never Smoker   Smokeless tobacco: Never  Used  Substance Use Topics   Alcohol use: No    Alcohol/week: 0.0 standard drinks   Drug use: No     Family Hx: The patient's family history includes Colon cancer in her father; Other in her father; Tuberculosis in her mother. There is no history of Breast cancer, Esophageal cancer, or Stomach cancer.  ROS:   Please see the history of present illness.     All other systems reviewed and are negative.   Prior CV studies:   The following studies were reviewed today:  Echo 04/30/17: Study Conclusions  - Left ventricle: The cavity size was moderately dilated. Wall thickness was normal. Systolic function was severely reduced. The estimated ejection fraction was 15%. No mural thrombus with Definity contrast. Diffuse hypokinesis. The study is not technically sufficient to allow evaluation of LV diastolic function. - Aortic valve: Mildly calcified leaflets. There was no stenosis. There was no regurgitation. - Mitral valve: Mildly thickened leaflets . There was moderate regurgitation. - Left atrium: Severely dilated. - Right ventricle: The cavity size was mildly dilated. Mildly reduced systolic function. - Right atrium: Severely dilated. - Tricuspid valve: There was moderate to severe regurgitation. - Pulmonary arteries: PA peak pressure: 45 mm Hg (S). - Inferior vena cava: The vessel was dilated. The respirophasic diameter changes were blunted (<50%), consistent with elevated central venous pressure. - Pericardium, extracardiac: There was a left pleural effusion.  Echo 08/03/17: Study Conclusions  - Left ventricle: The cavity size was normal. Wall thickness was increased in a pattern of mild LVH. Systolic function was severely reduced. The estimated ejection fraction was in the range of 20% to 25%. Diffuse hypokinesis. There is dyskinesis of the anteroseptal myocardium. The study is not technically sufficient to allow evaluation of LV  diastolic function. - Aortic root: The aortic root was normal in size. - Mitral valve: Calcified annulus. - Left atrium: The atrium was moderately dilated. - Pulmonary arteries: Systolic pressure was mildly to moderately increased.  Impressions:  - Diffuse hypokinesis with septal dyskinesis; overall severely reduced LV systolic function; mild LVH; moderate LAE; mild TR; mild to moderate pulmonary hypertension.  Labs/Other Tests and Data Reviewed:    EKG:  No ECG reviewed.  Recent Labs: 02/05/2018: ALT 10; Hemoglobin  12.5; Platelets 320 06/05/2018: BUN 17; Creatinine, Ser 0.91; Potassium 3.7; Sodium 138   Recent Lipid Panel Lab Results  Component Value Date/Time   CHOL 156 02/05/2018 09:37 AM   TRIG 124 02/05/2018 09:37 AM   HDL 58 02/05/2018 09:37 AM   CHOLHDL 2.7 02/05/2018 09:37 AM   CHOLHDL 4 01/30/2017 02:51 PM   LDLCALC 73 02/05/2018 09:37 AM   LDLDIRECT 56.0 10/17/2014 08:36 AM    Wt Readings from Last 3 Encounters:  07/17/18 158 lb (71.7 kg)  06/05/18 161 lb 3 oz (73.1 kg)  05/24/18 163 lb (73.9 kg)     Objective:    BP 139/77    Pulse (!) 59    Ht  (1.651 m)    Wt 158 lb (71.7 kg)    BMI 26.29 kg/m  GENERAL: Well-appearing.  No acute distress. HEENT: Pupils equal round.  Oral mucosa unremarkable NECK:  No jugular venous distention, no visible thyromegaly EXT:  No edema, no cyanosis no clubbing SKIN:  No rashes no nodules NEURO:  Speech fluent.  Cranial nerves grossly intact.  Moves all 4 extremities freely PSYCH:  Cognitively intact, oriented to person place and time   ASSESSMENT & PLAN:    # Chronic systolic and diastolic heart failure: LVEF 15% improved to 20-25%.  She is not on an ACE-I/ARB due to hypotension.  She is euvolemic and feeling well.  It is okay for her to take an extra dose of Lasix as needed for weight gain. Continue digoxin, metoprolol and lasix.    Digoxin level was stable 01/2018.  Will repeat 01/2019.  Ms. Kuper is not  interested in ICD.    # Hypertension:  BP stable.  Continue metoprolol.  # Chronic atrial fibrillation: Continue Eliquis, digoxin, and metoprolol.  Although her age is greater than 80, her weight is not less than 60 kg and her creatinine is less than 1.5.  Therefore she should be on 5 mg of Eliquis twice daily.  We talked about increasing the dose.  However she is hesitant because she had spontaneous bleeding into her knee.  Therefore she will continue on 2.5 mg.  # Depression: Suggested she follow her PCPs advice and see a psychiatrist.  Continue Zoloft for now.   COVID-19 Education: The signs and symptoms of COVID-19 were discussed with the patient and how to seek care for testing (follow up with PCP or arrange E-visit).  The importance of social distancing was discussed today.   Time:   Today, I have spent 25 minutes with the patient with telehealth technology discussing the above problems.     Medication Adjustments/Labs and Tests Ordered: Current medicines are reviewed at length with the patient today.  Concerns regarding medicines are outlined above.  Tests Ordered: No orders of the defined types were placed in this encounter.  Medication Changes: No orders of the defined types were placed in this encounter.   Disposition:  Follow up in 6 month(s)  Signed, [name], MD  07/17/2018 11:58 AM    [facility]

## 2018-07-17 NOTE — Patient Instructions (Addendum)
Medication Instructions:  Your physician recommends that you continue on your current medications as directed. Please refer to the Current Medication list given to you today.  If you need a refill on your cardiac medications before your next appointment, please call your pharmacy.   Lab work: NONE  Testing/Procedures: NONE   Follow-Up: At [facility], you and your health needs are our priority.  As part of our continuing mission to provide you with exceptional heart care, we have created designated Provider Care Teams.  These Care Teams include your primary Cardiologist (physician) and Advanced Practice Providers (APPs -  Physician Assistants and Nurse Practitioners) who all work together to provide you with the care you need, when you need it. You will need a follow up appointment in 6 months.  Please call our office 2 months in advance to schedule this appointment.  You may see Tiffany Wolverton, MD or one of the following Advanced Practice Providers on your designated Care Team:   Luke Kilroy, PA-C Krista Kroeger, PA-C . Callie Goodrich, PA-C   

## 2018-07-25 ENCOUNTER — Other Ambulatory Visit: Payer: Self-pay | Admitting: Internal Medicine

## 2018-07-25 ENCOUNTER — Other Ambulatory Visit: Payer: Self-pay | Admitting: *Deleted

## 2018-07-25 MED ORDER — BUDESONIDE ER 9 MG PO TB24: 9.0000 mg | ORAL_TABLET | Freq: Every day | ORAL | 1 refills | Status: DC

## 2018-07-25 MED ORDER — METOPROLOL SUCCINATE ER 50 MG PO TB24: 50.0000 mg | ORAL_TABLET | Freq: Every day | ORAL | 3 refills | Status: DC

## 2018-07-26 ENCOUNTER — Telehealth: Payer: Self-pay

## 2018-07-26 NOTE — Telephone Encounter (Signed)
I have submitted a prior authorization thru CoverMyMeds for patients Budesonide ER 9 mg tablets, Dx- microscopic colitis. Awaiting out come.

## 2018-07-26 NOTE — Telephone Encounter (Signed)
Budesonide has been approved thru 04/11/2019. Pharmacy informed.

## 2018-07-27 ENCOUNTER — Ambulatory Visit: Payer: Medicare Other | Admitting: Internal Medicine

## 2018-08-07 ENCOUNTER — Ambulatory Visit: Payer: Medicare Other | Admitting: Internal Medicine

## 2018-08-09 ENCOUNTER — Ambulatory Visit: Payer: Medicare Other | Admitting: Cardiovascular Disease

## 2018-11-08 ENCOUNTER — Other Ambulatory Visit: Payer: Self-pay | Admitting: Family Medicine

## 2018-11-08 NOTE — Telephone Encounter (Signed)
Does patient need follow up? LOV was on 06/05/2018 for Afib. No future appointments made

## 2018-11-09 NOTE — Telephone Encounter (Signed)
Please schedule. Thank you

## 2018-11-09 NOTE — Telephone Encounter (Signed)
Sent.  Yearly visit this fall is reasonable, when possible.  Thanks.

## 2018-11-11 ENCOUNTER — Other Ambulatory Visit: Payer: Self-pay | Admitting: Cardiovascular Disease

## 2018-11-12 NOTE — Telephone Encounter (Signed)
Pt scheduled for labs 01/04/19 and AWV with Dr. Damita Dunnings on 01/11/19

## 2018-11-16 ENCOUNTER — Other Ambulatory Visit: Payer: Self-pay

## 2018-11-16 MED ORDER — DIGOXIN 250 MCG PO TABS: 0.1250 mg | ORAL_TABLET | Freq: Every day | ORAL | 3 refills | Status: DC

## 2018-11-29 ENCOUNTER — Other Ambulatory Visit: Payer: Self-pay | Admitting: Cardiovascular Disease

## 2018-12-07 ENCOUNTER — Encounter: Payer: Self-pay | Admitting: Family Medicine

## 2018-12-13 ENCOUNTER — Other Ambulatory Visit: Payer: Self-pay | Admitting: Family Medicine

## 2018-12-13 ENCOUNTER — Telehealth: Payer: Self-pay | Admitting: Family Medicine

## 2018-12-13 NOTE — Telephone Encounter (Signed)
Allergy list updated.

## 2018-12-14 ENCOUNTER — Other Ambulatory Visit: Payer: Self-pay

## 2018-12-14 ENCOUNTER — Ambulatory Visit (INDEPENDENT_AMBULATORY_CARE_PROVIDER_SITE_OTHER): Payer: Medicare Other

## 2018-12-14 DIAGNOSIS — Z23 Encounter for immunization: Secondary | ICD-10-CM | POA: Diagnosis not present

## 2018-12-27 ENCOUNTER — Other Ambulatory Visit: Payer: Self-pay

## 2018-12-27 ENCOUNTER — Ambulatory Visit (INDEPENDENT_AMBULATORY_CARE_PROVIDER_SITE_OTHER): Payer: Medicare Other | Admitting: Family Medicine

## 2018-12-27 ENCOUNTER — Encounter: Payer: Self-pay | Admitting: Family Medicine

## 2018-12-27 VITALS — BP 108/68 | HR 100 | Temp 98.0°F | Ht 65.0 in | Wt 161.5 lb

## 2018-12-27 DIAGNOSIS — K529 Noninfective gastroenteritis and colitis, unspecified: Secondary | ICD-10-CM

## 2018-12-27 DIAGNOSIS — E559 Vitamin D deficiency, unspecified: Secondary | ICD-10-CM | POA: Diagnosis not present

## 2018-12-27 DIAGNOSIS — R6889 Other general symptoms and signs: Secondary | ICD-10-CM

## 2018-12-27 DIAGNOSIS — R490 Dysphonia: Secondary | ICD-10-CM

## 2018-12-27 DIAGNOSIS — I482 Chronic atrial fibrillation, unspecified: Secondary | ICD-10-CM | POA: Diagnosis not present

## 2018-12-27 DIAGNOSIS — R609 Edema, unspecified: Secondary | ICD-10-CM

## 2018-12-27 MED ORDER — FUROSEMIDE 40 MG PO TABS: 40.0000 mg | ORAL_TABLET | Freq: Every day | ORAL | Status: DC

## 2018-12-27 NOTE — Progress Notes (Signed)
She has fatigue and some lightheaded right after standing.  D/w pt about getting adequate fluid intake.  She usually has more coffee/tea than water.    She has some loose stools and used imodium prn.   She has only been taking 40mg  lasix daily, not BID. That has controlled her edema.  Still on potassium BID.  Med list updated.  Plans to start taking renne for GERD sx. She just got that filled, to start in the near future. She is using TUMS o/w and trying to avoid trigger foods.  No CP.   She is noted some occasional voice changes that could be exacerbated by GERD.  "It feels like something is crawling in my left ear."  She tried irrigation, wax removing solution, etc.  No pain.  No fevers or chills.  No R ear sx.  Sx going on for about 3 weeks.  She doesn't wear hearing aids.    Meds, vitals, and allergies reviewed.   ROS: Per HPI unless specifically indicated in ROS section   nad ncat B TM and canals wnl, no lesions noted. OP wnl Neck supple, no LA No stridor IRR, not tachy ctab abd soft, not ttp.

## 2018-12-27 NOTE — Patient Instructions (Addendum)
Go to the lab on the way out.  We'll contact you with your lab report. You can try a little hydrocortisone cream on the opening to the ear canal as needed.   If renne doesn't help your voice, then try taking omeprazole 20mg  a day.  You can get that OTC.  Take care.  Glad to see you.

## 2018-12-28 LAB — CBC WITH DIFFERENTIAL/PLATELET
Basophils Absolute: 0.1 10*3/uL (ref 0.0–0.1)
Basophils Relative: 1 % (ref 0.0–3.0)
Eosinophils Absolute: 0.3 10*3/uL (ref 0.0–0.7)
Eosinophils Relative: 3.6 % (ref 0.0–5.0)
HCT: 40.1 % (ref 36.0–46.0)
Hemoglobin: 13.2 g/dL (ref 12.0–15.0)
Lymphocytes Relative: 36 % (ref 12.0–46.0)
Lymphs Abs: 2.6 10*3/uL (ref 0.7–4.0)
MCHC: 33 g/dL (ref 30.0–36.0)
MCV: 91.3 fl (ref 78.0–100.0)
Monocytes Absolute: 0.6 10*3/uL (ref 0.1–1.0)
Monocytes Relative: 8.4 % (ref 3.0–12.0)
Neutro Abs: 3.7 10*3/uL (ref 1.4–7.7)
Neutrophils Relative %: 51 % (ref 43.0–77.0)
Platelets: 265 10*3/uL (ref 150.0–400.0)
RBC: 4.39 Mil/uL (ref 3.87–5.11)
RDW: 13.7 % (ref 11.5–15.5)
WBC: 7.2 10*3/uL (ref 4.0–10.5)

## 2018-12-28 LAB — COMPREHENSIVE METABOLIC PANEL
ALT: 7 U/L (ref 0–35)
AST: 15 U/L (ref 0–37)
Albumin: 3.7 g/dL (ref 3.5–5.2)
Alkaline Phosphatase: 87 U/L (ref 39–117)
BUN: 15 mg/dL (ref 6–23)
CO2: 31 mEq/L (ref 19–32)
Calcium: 9.5 mg/dL (ref 8.4–10.5)
Chloride: 98 mEq/L (ref 96–112)
Creatinine, Ser: 0.91 mg/dL (ref 0.40–1.20)
GFR: 58.63 mL/min — ABNORMAL LOW (ref >60.00)
Glucose, Bld: 93 mg/dL (ref 70–99)
Potassium: 4.2 mEq/L (ref 3.5–5.1)
Sodium: 138 mEq/L (ref 135–145)
Total Bilirubin: 0.5 mg/dL (ref 0.2–1.2)
Total Protein: 6.4 g/dL (ref 6.0–8.3)

## 2018-12-28 LAB — LDL CHOLESTEROL, DIRECT: Direct LDL: 58 mg/dL

## 2018-12-28 LAB — LIPID PANEL
Cholesterol: 261 mg/dL — ABNORMAL HIGH (ref 0–200)
HDL: 32.2 mg/dL — ABNORMAL LOW (ref >39.00)
Total CHOL/HDL Ratio: 8
Triglycerides: 424 mg/dL — ABNORMAL HIGH (ref 0.0–149.0)

## 2018-12-28 LAB — DIGOXIN LEVEL: Digoxin Level: 0.7 mcg/L — ABNORMAL LOW (ref 0.8–2.0)

## 2018-12-28 LAB — TSH: TSH: 3.01 u[IU]/mL (ref 0.35–4.50)

## 2018-12-28 LAB — VITAMIN D 25 HYDROXY (VIT D DEFICIENCY, FRACTURES): VITD: 14.37 ng/mL — ABNORMAL LOW (ref 30.00–100.00)

## 2018-12-30 DIAGNOSIS — R6889 Other general symptoms and signs: Secondary | ICD-10-CM | POA: Insufficient documentation

## 2018-12-30 NOTE — Assessment & Plan Note (Signed)
No lesions seen.  She could have had some episodic ear canal irritation.  She can try a little hydrocortisone cream on the opening to the ear canal as needed.  Update me as needed.  She agrees.

## 2018-12-30 NOTE — Assessment & Plan Note (Signed)
Reasonable to use Imodium as needed.  Update me as needed.

## 2018-12-30 NOTE — Assessment & Plan Note (Signed)
Plans to start taking renne for GERD sx. She just got that filled, to start in the near future. She is using TUMS o/w and trying to avoid trigger foods.  No CP.   She is noted some occasional voice changes that could be exacerbated by GERD.  See after visit summary.  If she does not improve as is she can switch over to omeprazole and update me as needed.

## 2019-01-04 ENCOUNTER — Other Ambulatory Visit: Payer: Medicare Other

## 2019-01-11 ENCOUNTER — Encounter: Payer: Self-pay | Admitting: Family Medicine

## 2019-01-11 ENCOUNTER — Ambulatory Visit (INDEPENDENT_AMBULATORY_CARE_PROVIDER_SITE_OTHER): Payer: Medicare Other | Admitting: Family Medicine

## 2019-01-11 ENCOUNTER — Other Ambulatory Visit: Payer: Self-pay

## 2019-01-11 VITALS — BP 122/70 | HR 84 | Temp 97.3°F | Ht 65.0 in | Wt 161.4 lb

## 2019-01-11 DIAGNOSIS — F339 Major depressive disorder, recurrent, unspecified: Secondary | ICD-10-CM | POA: Diagnosis not present

## 2019-01-11 DIAGNOSIS — E559 Vitamin D deficiency, unspecified: Secondary | ICD-10-CM

## 2019-01-11 DIAGNOSIS — Z0000 Encounter for general adult medical examination without abnormal findings: Secondary | ICD-10-CM

## 2019-01-11 DIAGNOSIS — Z7189 Other specified counseling: Secondary | ICD-10-CM

## 2019-01-11 NOTE — Patient Instructions (Addendum)
Check with your insurance to see if they will cover the shingrix shot. Please let me know how much vitamin D you are taking.  Take care.  Glad to see you.  Let me update cardiology in the meantime.

## 2019-01-11 NOTE — Progress Notes (Signed)
I have personally reviewed the Medicare Annual Wellness questionnaire and have noted 1. The patient's medical and social history 2. Their use of alcohol, tobacco or illicit drugs 3. Their current medications and supplements 4. The patient's functional ability including ADL's, fall risks, home safety risks and hearing or visual             impairment. 5. Diet and physical activities 6. Evidence for depression or mood disorders  The patients weight, height, BMI have been recorded in the chart and visual acuity is per eye clinic.  I have made referrals, counseling and provided education to the patient based review of the above and I have provided the pt with a written personalized care plan for preventive services.  Provider list updated- see scanned forms.  Routine anticipatory guidance given to patient.  See health maintenance. The possibility exists that previously documented standard health maintenance information may have been brought forward from a previous encounter into this note.  If needed, that same information has been updated to reflect the current situation based on today's encounter.    Flu 2012 Shingles discussed with patient. PNA up-to-date Tetanus 2012. Colon cancer screening done 2019. Breast cancer screening 2019 Bone density test declined. Advance directive-husband designated if patient were incapacitated.  Then would have her son/daughter designated if needed. Cognitive function addressed- see scanned forms- and if abnormal then additional documentation follows.   Mood d/w pt.  "It's like my mind doesn't want to be bothered."  She has lack of interest, lack of motivation.  More fatigue, sleeping more than normal.  Still on sertraline at baseline, "I thought it helped at one time but less now."  By that she means that she felt some better when she previously increased her sertraline to 100 mg a day.  She still enjoys games like words with friends.   No SI/HI.    Hoarse  voice occ noted.  No recent GERD sx.  On TUMS and taking [place]ie (from [place]) for GERD.    Vit D def d/w pt. discussed with patient about replacement.  Labs discussed with patient.  Cardiac hx d/w pt. no chest pain.  Not short of breath.  PMH and SH reviewed  Meds, vitals, and allergies reviewed.   ROS: Per HPI.  Unless specifically indicated otherwise in HPI, the patient denies:  General: fever. Eyes: acute vision changes ENT: sore throat Cardiovascular: chest pain Respiratory: SOB GI: vomiting GU: dysuria Musculoskeletal: acute back pain Derm: acute rash Neuro: acute motor dysfunction Psych: worsening mood Endocrine: polydipsia Heme: bleeding Allergy: hayfever  GEN: nad, alert and oriented HEENT: ncat NECK: supple w/o LA CV: rrr. PULM: ctab, no inc wob ABD: soft, +bs EXT: no edema SKIN: no acute rash  Health Maintenance  Topic Date Due  . DEXA SCAN  12/29/2025 (Originally 02/12/1998)  . TETANUS/TDAP  05/13/2020  . INFLUENZA VACCINE  Completed  . PNA vac Low Risk Adult  Completed

## 2019-01-13 NOTE — Assessment & Plan Note (Signed)
She will let me know how much vitamin D she is taking and we will go from there.

## 2019-01-13 NOTE — Assessment & Plan Note (Signed)
Advance directive-husband designated if patient were incapacitated.  Then would have her son/daughter designated if needed.

## 2019-01-13 NOTE — Assessment & Plan Note (Signed)
Flu 2012 Shingles discussed with patient. PNA up-to-date Tetanus 2012. Colon cancer screening done 2019. Breast cancer screening 2019 Bone density test declined. Advance directive-husband designated if patient were incapacitated.  Then would have her son/daughter designated if needed. Cognitive function addressed- see scanned forms- and if abnormal then additional documentation follows.

## 2019-01-13 NOTE — Assessment & Plan Note (Signed)
She is not having chest pain and she is not short of breath but she does have fatigue noted.  Her mood seems to be lower.  I do not suspect that her mood changes are related to decreased functional capacity/cardiac source.  She has follow-up with cardiology pending.  Assuming cardiology does not need to make significant changes to her medications, it may be reasonable to increase her SSRI.  I will update (and await input from) cardiology.

## 2019-01-17 ENCOUNTER — Other Ambulatory Visit: Payer: Self-pay | Admitting: Family Medicine

## 2019-01-17 DIAGNOSIS — E559 Vitamin D deficiency, unspecified: Secondary | ICD-10-CM

## 2019-01-17 MED ORDER — VITAMIN D (ERGOCALCIFEROL) 1.25 MG (50000 UNIT) PO CAPS: 50000.0000 [IU] | ORAL_CAPSULE | ORAL | 0 refills | Status: DC

## 2019-01-27 ENCOUNTER — Encounter: Payer: Self-pay | Admitting: Family Medicine

## 2019-02-05 ENCOUNTER — Other Ambulatory Visit: Payer: Self-pay

## 2019-02-05 ENCOUNTER — Ambulatory Visit: Payer: Medicare Other | Admitting: Cardiovascular Disease

## 2019-02-05 ENCOUNTER — Encounter: Payer: Self-pay | Admitting: Cardiovascular Disease

## 2019-02-05 VITALS — BP 124/71 | HR 88 | Ht 65.0 in | Wt 155.0 lb

## 2019-02-05 DIAGNOSIS — I4821 Permanent atrial fibrillation: Secondary | ICD-10-CM | POA: Diagnosis not present

## 2019-02-05 DIAGNOSIS — I10 Essential (primary) hypertension: Secondary | ICD-10-CM | POA: Diagnosis not present

## 2019-02-05 DIAGNOSIS — F331 Major depressive disorder, recurrent, moderate: Secondary | ICD-10-CM

## 2019-02-05 DIAGNOSIS — I5042 Chronic combined systolic (congestive) and diastolic (congestive) heart failure: Secondary | ICD-10-CM | POA: Diagnosis not present

## 2019-02-05 DIAGNOSIS — Z7901 Long term (current) use of anticoagulants: Secondary | ICD-10-CM | POA: Diagnosis not present

## 2019-02-05 NOTE — Patient Instructions (Signed)
Medication Instructions:  DECREASE YOUR FUROSEMIDE TO 40 MG 1/2 TABLET DAILY   *If you need a refill on your cardiac medications before your next appointment, please call your pharmacy*  Lab Work: NONE   Testing/Procedures: NONE  Follow-Up: At [facility], you and your health needs are our priority.  As part of our continuing mission to provide you with exceptional heart care, we have created designated Provider Care Teams.  These Care Teams include your primary Cardiologist (physician) and Advanced Practice Providers (APPs -  Physician Assistants and Nurse Practitioners) who all work together to provide you with the care you need, when you need it.  Your next appointment:   6 months  The format for your next appointment:   Either In Person or Virtual  Provider:   You may see [name], MD   or one of the following Advanced Practice Providers on your designated Care Team:    Kerin Ransom, PA-C  [place], [place]  Coletta Memos, [place]

## 2019-02-05 NOTE — Progress Notes (Signed)
Cardiology Office Note   Date:  02/05/2019   ID:  [name], DOB March 03, 1931, MRN [id number]  PCP:  Catherine Nam, MD  Cardiologist:   Catherine Si, MD  Cardiology APP: [name], [name]  No chief complaint on file.    History of Present Illness: [name] is a 85 y.o. female with chronic atrial fibrillation, chronic systolic and diastolic heart failure, left bundle branch block, spontaneous retroperitoneal bleed on Coumadin who presents for follow-up. She is previously seen by [name] 03/2017 and cleared for surgery. At that time she was in atrial fibrillation with well-controlled rates. She underwent left knee replacement 04/2017. Her hospital course was complicated by atrial fibrillation with rapid ventricular response and acute systolic and diastolic heart failure. Echo that admission revealed LVEF 15% which was down from 50% in 2017. She was diuresed with IV Lasix and had a Lexiscan Myoview without evidence of ischemia. She failed amiodarone and was started on digoxin. She had a repeat echocardiogram 07/2017 that revealed LVEF 20 to 25%.  Losartan was discontinued due to low BP and fatigue. Her fatigue has improved. She also struggled with diarrhea and stopped taking her digoxin in hopes that this would help. However the diarrhea did not improve. Digoxin was resumed.  She has declined ICD.  [name] saw her PCP 06/05/2018 and was in atrial fibrillation at that time.  She noted that her weight has been increasing and that her urinary response to Lasix was less than usual.  She temporarily doubled her Lasix and had a return to her baseline weight of around 160 pounds.  Renal function remained stable after making that change.    For the last 2 months she has struggled with colitis and diarrhea.  Lately she has been feeling very tired and wonders whether the colitis is contributing.  She has been feeling dizzy and unsteady on her feet.  She  lost 5 pounds in the last week.  She has frequent episodes of diarrhea and even has to get up in the middle of the night.  She denies melena or hematochezia but does have significant lower abdominal cramping.  She has no chest pain and her breathing has been stable.  She rarely has palpitations but they are not sustained.  She denies any syncope or presyncope.   Past Medical History:  Diagnosis Date  . Acute combined systolic and diastolic heart failure (HCC) 05/02/2017  . Anxiety   . Arthritis    "knees" (06/18/2015)  . Atrial fibrillation (HCC)    h/o sig bleed on coumadin  . Atrial fibrillation with RVR (HCC) 06/18/2015  . Cardiogenic shock (HCC) 05/02/2017  . CHF (congestive heart failure) (HCC)   . Complication of anesthesia 2010   "w/cardiac cath; got into a psychotic state for ~ 3 days; got me out w/Valium"  . Depression    "I have it off and on; not as often as I've gotten older" (06/18/2015)  . Diverticulosis    descending and sigmoid colon--[name]  . DJD (degenerative joint disease) of knee    left  . Dysrhythmia   . GERD (gastroesophageal reflux disease)   . Insomnia     off chronic ambien 5mg  as of 10/11, rare/episodic use since then  DCM/CHF  . Lymphocytic colitis 09/27/2016  . Nonischemic cardiomyopathy (HCC)    normal coronaries, EF 15-20% 04/2008, EF 50-55% 2015  . Osteoporosis    on DXA 05/2010  . Sciatica  Right    Past Surgical History:  Procedure Laterality Date  . BIOPSY  11/22/2017   Procedure: BIOPSY;  Surgeon: Catherine Boop, MD;  Location: [facility] ENDOSCOPY;  Service: Endoscopy;;  . CARDIAC CATHETERIZATION  2010  . CARDIOVERSION N/A 06/19/2015   Procedure: CARDIOVERSION;  Surgeon: Catherine Morale, MD;  Location: [facility] ENDOSCOPY;  Service: Cardiovascular;  Laterality: N/A;  . CATARACT EXTRACTION W/ INTRAOCULAR LENS  IMPLANT, BILATERAL Bilateral   . COLONOSCOPY WITH PROPOFOL N/A 11/22/2017   Procedure: COLONOSCOPY WITH PROPOFOL;  Surgeon: Catherine Boop, MD;   Location: [facility] ENDOSCOPY;  Service: Endoscopy;  Laterality: N/A;  . DILATION AND CURETTAGE OF UTERUS    . TEE WITHOUT CARDIOVERSION N/A 06/19/2015   Procedure: TRANSESOPHAGEAL ECHOCARDIOGRAM (TEE);  Surgeon: Catherine Morale, MD;  Location: [facility] ENDOSCOPY;  Service: Cardiovascular;  Laterality: N/A;  . TOTAL KNEE ARTHROPLASTY Left 04/25/2017   Procedure: TOTAL KNEE ARTHROPLASTY;  Surgeon: Catherine Corning, MD;  Location: [facility] OR;  Service: Orthopedics;  Laterality: Left;  [name] VAGINAL HYSTERECTOMY  1979   ovaries intact     Current Outpatient Medications  Medication Sig Dispense Refill  . acetaminophen (TYLENOL) 500 MG tablet Take 500 mg by mouth 2 (two) times daily as needed for moderate pain or headache.     [name] apixaban (ELIQUIS) 2.5 MG TABS tablet Take 1 tablet (2.5 mg total) by mouth 2 (two) times daily. 180 tablet 1  . calcium carbonate (TUMS - DOSED IN MG ELEMENTAL CALCIUM) 500 MG chewable tablet Chew 1 tablet by mouth 3 (three) times daily as needed for indigestion or heartburn.     . Carboxymethylcellul-Glycerin ([employer] LUBRICATING/DRY EYE OP) Place 1-2 drops into both eyes daily as needed (for dry eyes).    [name] digoxin (LANOXIN) 0.25 MG tablet Take 0.5 tablets (0.125 mg total) by mouth daily. 45 tablet 3  . furosemide (LASIX) 40 MG tablet Take 40 mg by mouth as directed. TAKE 1/2 TABLET DAILY    . KLOR-CON M20 20 MEQ tablet TAKE 1 TABLET BY MOUTH TWICE A DAY 180 tablet 1  . loperamide (IMODIUM) 2 MG capsule Take 2 mg by mouth as needed for diarrhea or loose stools.    . metoprolol succinate (TOPROL-XL) 50 MG 24 hr tablet Take 1 tablet (50 mg total) by mouth daily. Take with or immediately following a meal. 90 tablet 3  . NON FORMULARY Hemp Salve topically    . sertraline (ZOLOFT) 50 MG tablet TAKE TWO (2) TABLETS BY MOUTH DAILY 180 tablet 1  . traMADol (ULTRAM) 50 MG tablet Take 50 mg by mouth every 6 (six) hours as needed.    . Vitamin D, Ergocalciferol, (DRISDOL) 1.25 MG (50000 UT) CAPS capsule Take  1 capsule (50,000 Units total) by mouth every 7 (seven) days. 12 capsule 0   No current facility-administered medications for this visit.     Allergies:   Ace inhibitors, Warfarin sodium, Famotidine, Codeine, Delsym [dextromethorphan polistirex er], Lactose intolerance (gi), Phenylephrine, and Sulfamethoxazole-trimethoprim    Social History:  The patient  reports that she has never smoked. She has never used smokeless tobacco. She reports that she does not drink alcohol or use drugs.   Family History:  The patient's family history includes Colon cancer in her father; Other in her father; Tuberculosis in her mother.    ROS:  Please see the history of present illness.   Otherwise, review of systems are positive for none.   All other systems are reviewed and negative.    PHYSICAL  EXAM: VS:  BP 124/71   Pulse 88   Ht 5\' 5"  (1.651 m)   Wt 155 lb (70.3 kg)   BMI 25.79 kg/m  , BMI Body mass index is 25.79 kg/m. GENERAL:  Well appearing HEENT: Pupils equal round and reactive, fundi not visualized, oral mucosa unremarkable NECK:  No jugular venous distention, waveform within normal limits, carotid upstroke brisk and symmetric, no bruits, no thyromegaly LYMPHATICS:  No cervical adenopathy LUNGS:  Clear to auscultation bilaterally HEART:  Irregularly irregular.  PMI not displaced or sustained,S1 and S2 within normal limits, no S3, no S4, no clicks, no rubs, II/VI systolic murmur at the apex ABD:  Flat, positive bowel sounds normal in frequency in pitch, no bruits, no rebound, no guarding, no midline pulsatile mass, no hepatomegaly, no splenomegaly EXT:  2 plus pulses throughout, no edema, no cyanosis no clubbing SKIN:  No rashes no nodules NEURO:  Cranial nerves II through XII grossly intact, motor grossly intact throughout PSYCH:  Cognitively intact, oriented to person place and time   EKG:  EKG is ordered today. 02/01/2018: Atrial fibrillation.  Rate 70 bpm.  Right axis deviation.  IVCD.  02/05/2019: Atrial fibrillation.  Rate 88 bpm.  Left axis deviation.  Left bundle branch block.  Echo 04/30/17: Study Conclusions  - Left ventricle: The cavity size was moderately dilated. Wall   thickness was normal. Systolic function was severely reduced. The   estimated ejection fraction was 15%. No mural thrombus with   Definity contrast. Diffuse hypokinesis. The study is not   technically sufficient to allow evaluation of LV diastolic   function. - Aortic valve: Mildly calcified leaflets. There was no stenosis.   There was no regurgitation. - Mitral valve: Mildly thickened leaflets . There was moderate   regurgitation. - Left atrium: Severely dilated. - Right ventricle: The cavity size was mildly dilated. Mildly   reduced systolic function. - Right atrium: Severely dilated. - Tricuspid valve: There was moderate to severe regurgitation. - Pulmonary arteries: PA peak pressure: 45 mm Hg (S). - Inferior vena cava: The vessel was dilated. The respirophasic   diameter changes were blunted (< 50%), consistent with elevated   central venous pressure. - Pericardium, extracardiac: There was a left pleural effusion.  Echo 08/03/17: Study Conclusions  - Left ventricle: The cavity size was normal. Wall thickness was   increased in a pattern of mild LVH. Systolic function was   severely reduced. The estimated ejection fraction was in the   range of 20% to 25%. Diffuse hypokinesis. There is dyskinesis of   the anteroseptal myocardium. The study is not technically   sufficient to allow evaluation of LV diastolic function. - Aortic root: The aortic root was normal in size. - Mitral valve: Calcified annulus. - Left atrium: The atrium was moderately dilated. - Pulmonary arteries: Systolic pressure was mildly to moderately   increased.  Impressions:  - Diffuse hypokinesis with septal dyskinesis; overall severely   reduced LV systolic function; mild LVH; moderate LAE; mild TR;   mild  to moderate pulmonary hypertension.   Recent Labs: 12/27/2018: ALT 7; BUN 15; Creatinine, Ser 0.91; Hemoglobin 13.2; Platelets 265.0; Potassium 4.2; Sodium 138; TSH 3.01    Lipid Panel    Component Value Date/Time   CHOL 261 (H) 12/27/2018 1657   CHOL 156 02/05/2018 0937   TRIG (H) 12/27/2018 1657    424.0 Triglyceride is over 400; calculations on Lipids are invalid.   HDL 32.20 (L) 12/27/2018 1657   HDL 58  02/05/2018 0937   CHOLHDL 8 12/27/2018 1657   VLDL 30.2 01/30/2017 1451   LDLCALC 73 02/05/2018 0937   LDLDIRECT 58.0 12/27/2018 1657      Wt Readings from Last 3 Encounters:  02/05/19 155 lb (70.3 kg)  01/11/19 161 lb 6 oz (73.2 kg)  12/27/18 161 lb 8 oz (73.3 kg)      ASSESSMENT AND PLAN:  # Chronic systolic and diastolic heart failure: LVEF 15% improved to 20-25%. She is not on an ACE-I/ARB due to hypotension. She is euvolemic and feeling well.    She has been struggling with a lot of diarrhea and has some lightheadedness and dizziness.  We will reduce Lasix to 20 mg daily.  Digoxin level was 0.7 when tested last month.  Continue current dose of digoxin and metoprolol.   Ms. Kovich has declined ICD.   # Hypertension: BP stable. Continue metoprolol.  # Chronic atrial fibrillation: Continue Eliquis, digoxin,and metoprolol. Although her age is greater than 58, her weight is not less than 60 kg and her creatinine is less than 1.5. Therefore she should be on 5 mg of Eliquis twice daily. We talked about increasing the dose. However she is hesitant because she had spontaneous bleeding into her knee. Therefore she will continue on 2.5 mg.  Digoxin level OK 12/2018.  # Depression: Suggested she follow her PCPs advice and see a psychiatrist.  I agree that most of her symptoms are depressive and not cardiac.  I did refer to Dr. Damita Dunnings regarding titration of her medications.  Current medicines are reviewed at length with the patient today.  The patient has concerns  regarding medicines.  The following changes have been made:  Reduce lasix to 20mg .    Labs/ tests ordered today include:   Orders Placed This Encounter  Procedures  . EKG 12-Lead     Disposition:   FU with Manveer Gomes C. Oval Linsey, MD, [facility] in 6 months     Signed, Idella Lamontagne C. Oval Linsey, MD, [facility]  02/05/2019 12:57 PM    [facility]

## 2019-02-06 ENCOUNTER — Encounter: Payer: Self-pay | Admitting: Gastroenterology

## 2019-02-06 ENCOUNTER — Ambulatory Visit: Payer: Medicare Other | Admitting: Gastroenterology

## 2019-02-06 ENCOUNTER — Telehealth: Payer: Self-pay | Admitting: Family Medicine

## 2019-02-06 VITALS — BP 120/64 | HR 67 | Temp 97.4°F | Ht 65.0 in | Wt 161.0 lb

## 2019-02-06 DIAGNOSIS — K52839 Microscopic colitis, unspecified: Secondary | ICD-10-CM | POA: Diagnosis not present

## 2019-02-06 DIAGNOSIS — R197 Diarrhea, unspecified: Secondary | ICD-10-CM | POA: Diagnosis not present

## 2019-02-06 MED ORDER — PREDNISONE 5 MG PO TABS: ORAL_TABLET | ORAL | 1 refills | Status: AC

## 2019-02-06 NOTE — Patient Instructions (Addendum)
If you are age 83 or older, your body mass index should be between 23-30. Your Body mass index is 26.79 kg/m. If this is out of the aforementioned range listed, please consider follow up with your Primary Care Provider.  If you are age 55 or younger, your body mass index should be between 19-25. Your Body mass index is 26.79 kg/m. If this is out of the aformentioned range listed, please consider follow up with your Primary Care Provider.    We have sent the following medications to your pharmacy for you to pick up at your convenience: Prednisone   Please keep follow-up with Dr. Carlean Purl in Dec 2020  Thank you for choosing me and [facility].  Janett Billow Zehr-PA

## 2019-02-06 NOTE — Progress Notes (Signed)
02/06/2019 [name] [id number] 02/10/1931   HISTORY OF PRESENT ILLNESS:  This is an 85 year old female with complaints of diarrhea.  Has history of microscopic/lymphocytic colitis as well as IBS-D in the past.  Symptoms seem episodic, with severe "bouts" of diarrhea, but last sometimes for several days at a time.  She says that it usually depends on what she eats, but that is not always consistent either.  Reports abdominal cramping and gas associated with the diarrhea.  Says that when she goes through these episodes she is even up several times at night with diarrhea.  Was previously on budesonide, but when this was represcribed back in March by Dr. Leone Payor it was $300 so she never got the prescription.  She is very frustrated by this diarrhea and even says that she feels depressed because she cannot eat what she wants to, etc. Takes a lot of Imodium and Pepto-Bismol.   Past Medical History:  Diagnosis Date  . Acute combined systolic and diastolic heart failure (HCC) 05/02/2017  . Anxiety   . Arthritis    "knees" (06/18/2015)  . Atrial fibrillation (HCC)    h/o sig bleed on coumadin  . Atrial fibrillation with RVR (HCC) 06/18/2015  . Cardiogenic shock (HCC) 05/02/2017  . CHF (congestive heart failure) (HCC)   . Complication of anesthesia 2010   "w/cardiac cath; got into a psychotic state for ~ 3 days; got me out w/Valium"  . Depression    "I have it off and on; not as often as I've gotten older" (06/18/2015)  . Diverticulosis    descending and sigmoid colon--Dr. Jarold Motto  . DJD (degenerative joint disease) of knee    left  . Dysrhythmia   . GERD (gastroesophageal reflux disease)   . Insomnia     off chronic ambien 5mg  as of 10/11, rare/episodic use since then  DCM/CHF  . Lymphocytic colitis 09/27/2016  . Nonischemic cardiomyopathy (HCC)    normal coronaries, EF 15-20% 04/2008, EF 50-55% 2015  . Osteoporosis    on DXA 05/2010  . Sciatica    Right   Past Surgical History:   Procedure Laterality Date  . BIOPSY  11/22/2017   Procedure: BIOPSY;  Surgeon: Iva Boop, MD;  Location: [facility] ENDOSCOPY;  Service: Endoscopy;;  . CARDIAC CATHETERIZATION  2010  . CARDIOVERSION N/A 06/19/2015   Procedure: CARDIOVERSION;  Surgeon: Laurey Morale, MD;  [facility] ENDOSCOPY;  Service: Cardiovascular;  Laterality: N/A;  . CATARACT EXTRACTION W/ INTRAOCULAR LENS  IMPLANT, BILATERAL Bilateral   . COLONOSCOPY WITH PROPOFOL N/A 11/22/2017   Procedure: COLONOSCOPY WITH PROPOFOL;  Surgeon: Iva Boop, MD;  Location: [facility] ENDOSCOPY;  Service: Endoscopy;  Laterality: N/A;  . DILATION AND CURETTAGE OF UTERUS    . TEE WITHOUT CARDIOVERSION N/A 06/19/2015   Procedure: TRANSESOPHAGEAL ECHOCARDIOGRAM (TEE);  Surgeon: Laurey Morale, MD;  Location: [facility] ENDOSCOPY;  Service: Cardiovascular;  Laterality: N/A;  . TOTAL KNEE ARTHROPLASTY Left 04/25/2017   Procedure: TOTAL KNEE ARTHROPLASTY;  Surgeon: [name], MD;  Location: [facility] OR;  Service: Orthopedics;  Laterality: Left;  [name] VAGINAL HYSTERECTOMY  1979   ovaries intact    reports that she has never smoked. She has never used smokeless tobacco. She reports that she does not drink alcohol or use drugs. family history includes Colon cancer in her father; Other in her father; Tuberculosis in her mother. Allergies  Allergen Reactions  . Ace Inhibitors Cough  . Warfarin Sodium Other (See Comments)  DOSE RELATED PHARMACOLOGIC EFFECT "bleed out"  . Famotidine     GI upset  . Codeine Other (See Comments)    sedation  . Delsym [Dextromethorphan Polistirex Er] Other (See Comments)    dizziness  . Lactose Intolerance (Gi) Diarrhea  . Phenylephrine Palpitations and Other (See Comments)    Nasal spray- "likely increase in nasal congestion".   . Sulfamethoxazole-Trimethoprim Nausea And Vomiting    GI intolerance.      Outpatient Encounter Medications as of 02/06/2019  Medication Sig  . acetaminophen (TYLENOL) 500 MG tablet Take 500  mg by mouth 2 (two) times daily as needed for moderate pain or headache.   [name] apixaban (ELIQUIS) 2.5 MG TABS tablet Take 1 tablet (2.5 mg total) by mouth 2 (two) times daily.  . calcium carbonate (TUMS - DOSED IN MG ELEMENTAL CALCIUM) 500 MG chewable tablet Chew 1 tablet by mouth 3 (three) times daily as needed for indigestion or heartburn.   . Carboxymethylcellul-Glycerin ([employer] LUBRICATING/DRY EYE OP) Place 1-2 drops into both eyes daily as needed (for dry eyes).  [name] digoxin (LANOXIN) 0.25 MG tablet Take 0.5 tablets (0.125 mg total) by mouth daily.  . furosemide (LASIX) 40 MG tablet Take 40 mg by mouth as directed. TAKE 1/2 TABLET DAILY  . KLOR-CON M20 20 MEQ tablet TAKE 1 TABLET BY MOUTH TWICE A DAY  . loperamide (IMODIUM) 2 MG capsule Take 2 mg by mouth as needed for diarrhea or loose stools.  . metoprolol succinate (TOPROL-XL) 50 MG 24 hr tablet Take 1 tablet (50 mg total) by mouth daily. Take with or immediately following a meal.  . NON FORMULARY Hemp Salve topically  . sertraline (ZOLOFT) 50 MG tablet TAKE TWO (2) TABLETS BY MOUTH DAILY  . traMADol (ULTRAM) 50 MG tablet Take 50 mg by mouth every 6 (six) hours as needed.  . Vitamin D, Ergocalciferol, (DRISDOL) 1.25 MG (50000 UT) CAPS capsule Take 1 capsule (50,000 Units total) by mouth every 7 (seven) days.   No facility-administered encounter medications on file as of 02/06/2019.      REVIEW OF SYSTEMS  : All other systems reviewed and negative except where noted in the History of Present Illness.   PHYSICAL EXAM: BP 120/64   Pulse 67   Temp (!) 97.4 F (36.3 C)   Ht 5\' 5"  (1.651 m)   Wt 161 lb (73 kg)   BMI 26.79 kg/m  General: Well developed white female in no acute distress Head: Normocephalic and atraumatic Eyes:  Sclerae anicteric, conjunctiva pink. Ears: Normal auditory acuity Lungs: Clear throughout to auscultation; no increased WOB. Heart: Regular rate and rhythm; no M/R/G. Abdomen: Soft, non-distended.  BS present.   Mild diffuse TTP. Musculoskeletal: Symmetrical with no gross deformities  Skin: No lesions on visible extremities Extremities: No edema  Neurological: Alert oriented x 4, grossly non-focal Psychological:  Alert and cooperative. Normal mood and affect  ASSESSMENT AND PLAN: *85 year old female with complaints of diarrhea.  Has history of microscopic/lymphocytic colitis as well as IBS-D in the past.  Symptoms seem episodic, with severe "bouts" of diarrhea, but last sometimes for several days at a time.  Was previously on budesonide, but when this was represcribed back in March by Dr. April it was $300 so she never got the prescription.  She is very frustrated by this diarrhea and even says that she feels depressed because she cannot eat what she wants to, etc.  We will try a tapering course of prednisone.  We will start at  40 mg for 2 weeks and then taper by 5 mg weekly.  I am going to make Dr. Carlean Purl aware of this plan and see if he has anything additional to add especially in regards to the amount of time that she should be on the prednisone, tapering instructions, etc.  She will follow-up with Dr. Carlean Purl in 4 to 6 weeks as well as she was supposed to see him back in March, but then it was canceled due to Covid.   CC:  [name], MD

## 2019-02-06 NOTE — Telephone Encounter (Signed)
Please update patient.  I saw the notes from cardiology and also from GI.  I thought it made sense to consider adjusting her SSRI after she finishes her taper of prednisone, depending on how she is feeling.  I am hopeful that if her GI symptoms improve on the prednisone and that may also help her mood.  Update me as needed in the meantime.  Thanks.

## 2019-02-07 NOTE — Telephone Encounter (Signed)
Patient advised and agrees with the plan.  

## 2019-02-18 ENCOUNTER — Other Ambulatory Visit: Payer: Self-pay

## 2019-02-18 MED ORDER — PREDNISONE 10 MG PO TABS: ORAL_TABLET | ORAL | 1 refills | Status: DC

## 2019-02-27 ENCOUNTER — Telehealth: Payer: Self-pay | Admitting: *Deleted

## 2019-02-27 MED ORDER — DIGOXIN 125 MCG PO TABS: 0.1250 mg | ORAL_TABLET | Freq: Every day | ORAL | 3 refills | Status: DC

## 2019-02-27 NOTE — Telephone Encounter (Signed)
Refilled patients Digoxin at 125 mcg daily

## 2019-03-25 ENCOUNTER — Encounter: Payer: Self-pay | Admitting: Internal Medicine

## 2019-03-25 ENCOUNTER — Ambulatory Visit: Payer: Medicare Other | Admitting: Internal Medicine

## 2019-03-25 VITALS — BP 92/54 | HR 75 | Temp 97.9°F | Ht 65.0 in | Wt 160.0 lb

## 2019-03-25 DIAGNOSIS — K582 Mixed irritable bowel syndrome: Secondary | ICD-10-CM | POA: Diagnosis not present

## 2019-03-25 DIAGNOSIS — N3941 Urge incontinence: Secondary | ICD-10-CM

## 2019-03-25 DIAGNOSIS — K52832 Lymphocytic colitis: Secondary | ICD-10-CM

## 2019-03-25 NOTE — Patient Instructions (Signed)
Please follow up with Dr Damita Dunnings about your balance issues.   Please take 1 tablespoon of benefiber at bedtime. Handout provided.   We have given you samples of the following medication to take: IBgard: Take 2 capsules at bedtime If this helps you then purchase it over the counter and take 2 at bedtime.   Taper and finish your prednisone as directed.   Dr Carlean Purl would like to see you back in 6-8 weeks.   I appreciate the opportunity to care for you. Ronney Lion, [facility]

## 2019-03-25 NOTE — Progress Notes (Signed)
[name] 85 y.o. Jul 17, 1930 [id number]  Assessment & Plan:   Encounter Diagnoses  Name Primary?  . Lymphocytic colitis Yes  . Irritable bowel syndrome with both constipation and diarrhea   . Urge urinary incontinence    I am not sure how much of this is actually a microscopic colitis versus IBS.  In my experience I think sometimes we find lymphocytic colitis and patients have more IBS symptoms like she does with alternating or mixed bowel habits.  Though she does tend to get more diarrhea predominance at times and that could be the lymphocytic colitis flaring.  She is definitely improved with respect to diarrhea but her IBS is still a problem.  We will have her finish her prednisone taper, see her primary care provider regarding her concerns about balance issues.  I am going to try her on Benefiber supplementation 1 tablespoon daily probably in the evening if she can do that and to take to IBgard at bedtime to see if that relieves this defecation issue where she has an urge to defecate at about 3 in the morning.  My little puzzled by that with respect to IBS but she has had a colonoscopy and I do not think we need to do anything different at this point.   The urge urinary incontinence seems mild and I am not getting a sense of major pelvic floor issues but at her age that could be playing a role in her defecation difficulties.  I would like to see her back in about 6 to 8 weeks she will most likely have to call for that. I appreciate the opportunity to care for this patient. CC: [name], MD   Subjective:   Chief Complaint: Follow-up of lymphocytic colitis HPI [name] is here for follow-up, she is improved with respect to diarrhea that has troubled her, caused by lymphocytic colitis we think.  She is on a prednisone taper started by Jessica's there at last visit on October 28.  Almost finished down to 2.5 mg daily.  She is now having more of a mix bowel pattern where she  will have little pellets at times and then sometimes have looser stools described as soft.  She cannot determine triggers.  Though she feels better she is not satisfied with her quality of life regarding this.  She also will awaken at about 3 in the morning frequently with an urge to defecate.  She has some mild cramps.  She says "I just do not feel well".  And then goes on to say her balance is off.  Diet history, she avoids milk products because she believes she is lactose intolerant.  She does not really eat significant fiber in her diet that I can tell and no supplements.  She has a couple coffee in the morning.  She stopped sweet tea she stopped candy she stopped most desserts because she thought sweet foods bother her more.  Additional symptoms are fatigue and increased sleep.  She does have some urge urinary incontinence but not a lot of leakage or anything like that.  Her last meal is around 530 or 6 PM and is rare that she snacks. I do not believe she has fallen Wt Readings from Last 3 Encounters:  03/25/19 160 lb (72.6 kg)  02/06/19 161 lb (73 kg)  02/05/19 155 lb (70.3 kg)     Allergies  Allergen Reactions  . Ace Inhibitors Cough  . Warfarin Sodium Other (See Comments)  DOSE RELATED PHARMACOLOGIC EFFECT "bleed out"  . Famotidine     GI upset  . Codeine Other (See Comments)    sedation  . Delsym [Dextromethorphan Polistirex Er] Other (See Comments)    dizziness  . Lactose Intolerance (Gi) Diarrhea  . Phenylephrine Palpitations and Other (See Comments)    Nasal spray- "likely increase in nasal congestion".   . Sulfamethoxazole-Trimethoprim Nausea And Vomiting    GI intolerance.   Current Meds  Medication Sig  . acetaminophen (TYLENOL) 500 MG tablet Take 500 mg by mouth 2 (two) times daily as needed for moderate pain or headache.   [name] apixaban (ELIQUIS) 2.5 MG TABS tablet Take 1 tablet (2.5 mg total) by mouth 2 (two) times daily.  . calcium carbonate (TUMS - DOSED IN MG  ELEMENTAL CALCIUM) 500 MG chewable tablet Chew 1 tablet by mouth 3 (three) times daily as needed for indigestion or heartburn.   . Carboxymethylcellul-Glycerin ([employer] LUBRICATING/DRY EYE OP) Place 1-2 drops into both eyes daily as needed (for dry eyes).  [name] digoxin (LANOXIN) 0.125 MG tablet Take 1 tablet (0.125 mg total) by mouth daily.  . furosemide (LASIX) 40 MG tablet Take 40 mg by mouth as directed. TAKE 1/2 TABLET DAILY  . KLOR-CON M20 20 MEQ tablet TAKE 1 TABLET BY MOUTH TWICE A DAY  . loperamide (IMODIUM) 2 MG capsule Take 2 mg by mouth as needed for diarrhea or loose stools.  [name] loperamide (IMODIUM) 2 MG capsule Take by mouth as needed for diarrhea or loose stools.  . metoprolol succinate (TOPROL-XL) 50 MG 24 hr tablet Take 1 tablet (50 mg total) by mouth daily. Take with or immediately following a meal.  . NON FORMULARY Hemp Salve topically  . predniSONE (DELTASONE) 10 MG tablet Take 4 tablets by mouth daily for 3 days then decrease by 5mg  every 5 days. Once you have completed the 5mg  dose for 5 days then take 5mg  every other day for 5 days.  . predniSONE (DELTASONE) 5 MG tablet Take 8 tablets (40 mg total) by mouth daily for 14 days, THEN 7 tablets (35 mg total) daily for 5 days, THEN 6 tablets (30 mg total) daily for 5 days, THEN 5 tablets (25 mg total) daily for 5 days, THEN 4 tablets (20 mg total) daily for 5 days, THEN 2 tablets (10 mg total) daily for 5 days, THEN 1 tablet (5 mg total) daily for 5 days, THEN 0.5 tablets (2.5 mg total) daily for 5 days.  sertraline (ZOLOFT) 50 MG tablet TAKE TWO (2) TABLETS BY MOUTH DAILY  . traMADol (ULTRAM) 50 MG tablet Take 50 mg by mouth every 6 (six) hours as needed.  . Vitamin D, Ergocalciferol, (DRISDOL) 1.25 MG (50000 UT) CAPS capsule Take 1 capsule (50,000 Units total) by mouth every 7 (seven) days.   Past Medical History:  Diagnosis Date  . Acute combined systolic and diastolic heart failure (HCC) 05/02/2017  . Anxiety   . Arthritis     "knees" (06/18/2015)  . Atrial fibrillation (HCC)    h/o sig bleed on coumadin  . Atrial fibrillation with RVR (HCC) 06/18/2015  . Cardiogenic shock (HCC) 05/02/2017  . CHF (congestive heart failure) (HCC)   . Complication of anesthesia 2010   "w/cardiac cath; got into a psychotic state for ~ 3 days; got me out w/Valium"  . Depression    "I have it off and on; not as often as I've gotten older" (06/18/2015)  . Diverticulosis    descending and sigmoid  colon--Dr. Sharlett Iles  . DJD (degenerative joint disease) of knee    left  . Dysrhythmia   . GERD (gastroesophageal reflux disease)   . Insomnia     off chronic ambien 5mg  as of 10/11, rare/episodic use since then  DCM/CHF  . Lymphocytic colitis 09/27/2016  . Nonischemic cardiomyopathy ([place])    normal coronaries, EF 15-20% 04/2008, EF 50-55% 2015  . Osteoporosis    on DXA 05/2010  . Sciatica    Right   Past Surgical History:  Procedure Laterality Date  . BIOPSY  11/22/2017   Procedure: BIOPSY;  Surgeon: Gatha Mayer, MD;  Location: [facility];  Service: Endoscopy;;  . CARDIAC CATHETERIZATION  2010  . CARDIOVERSION N/A 06/19/2015   Procedure: CARDIOVERSION;  Surgeon: Larey Dresser, MD;  Location: [facility];  Service: Cardiovascular;  Laterality: N/A;  . CATARACT EXTRACTION W/ INTRAOCULAR LENS  IMPLANT, BILATERAL Bilateral   . COLONOSCOPY WITH PROPOFOL N/A 11/22/2017   Procedure: COLONOSCOPY WITH PROPOFOL;  Surgeon: Gatha Mayer, MD;  [facility];  Service: Endoscopy;  Laterality: N/A;  . DILATION AND CURETTAGE OF UTERUS    . TEE WITHOUT CARDIOVERSION N/A 06/19/2015   Procedure: TRANSESOPHAGEAL ECHOCARDIOGRAM (TEE);  Surgeon: Larey Dresser, MD;  Location: [facility];  Service: Cardiovascular;  Laterality: N/A;  . TOTAL KNEE ARTHROPLASTY Left 04/25/2017   Procedure: TOTAL KNEE ARTHROPLASTY;  Surgeon: Melrose Nakayama, MD;  Location: [place];  Service: Orthopedics;  Laterality: Left;  [name] VAGINAL HYSTERECTOMY  1979   ovaries  intact   Social History   Social History Narrative   Retired, former [employer]   Married, 1953   Lives with husband   2 grown children, one in [place], one out of state   Tobacco Use - No.    Drug Use - no   teaches Sunday school, reading   family history includes Colon cancer in her father; Other in her father; Tuberculosis in her mother.   Review of Systems  See HPI  Objective:   Physical Exam 'BP (!) 92/54   Pulse 75   Temp 97.9 F (36.6 C)   Ht 5\' 5"  (1.651 m)   Wt 160 lb (72.6 kg)   BMI 26.63 kg/m  NAD elderly ww looking robust for stated age Anicteric abd soft NT without organomegaly or mass She is alert and oriented x3 Appropriate mood and affect Her gait is a bit shuffling with small steps and she does seem unsteady and required assistance she has a cane with her as well  Data reviewed see HPI I have looked at her labs in the EMR previous colonoscopy biopsies previous GI notes from this year.

## 2019-03-27 ENCOUNTER — Telehealth: Payer: Self-pay | Admitting: Family Medicine

## 2019-03-27 NOTE — Telephone Encounter (Signed)
Please see about getting patient scheduled for 30-minute office visit when possible to evaluate her gait.  Dr. Carlean Purl noted some gait changes at the GI visit.  Thanks.

## 2019-03-27 NOTE — Telephone Encounter (Signed)
Left message for patient to call the office. Need to schedule 30 minute office visit to evaluate her gait.

## 2019-03-28 ENCOUNTER — Other Ambulatory Visit: Payer: Self-pay

## 2019-03-28 ENCOUNTER — Ambulatory Visit (INDEPENDENT_AMBULATORY_CARE_PROVIDER_SITE_OTHER): Payer: Medicare Other | Admitting: Family Medicine

## 2019-03-28 ENCOUNTER — Encounter: Payer: Self-pay | Admitting: Family Medicine

## 2019-03-28 VITALS — BP 102/60 | HR 109 | Temp 96.8°F | Ht 65.0 in | Wt 162.4 lb

## 2019-03-28 DIAGNOSIS — F339 Major depressive disorder, recurrent, unspecified: Secondary | ICD-10-CM | POA: Diagnosis not present

## 2019-03-28 DIAGNOSIS — R5383 Other fatigue: Secondary | ICD-10-CM

## 2019-03-28 DIAGNOSIS — E559 Vitamin D deficiency, unspecified: Secondary | ICD-10-CM

## 2019-03-28 LAB — BASIC METABOLIC PANEL
BUN: 13 mg/dL (ref 6–23)
CO2: 29 mEq/L (ref 19–32)
Calcium: 9.3 mg/dL (ref 8.4–10.5)
Chloride: 97 mEq/L (ref 96–112)
Creatinine, Ser: 0.92 mg/dL (ref 0.40–1.20)
GFR: 57.86 mL/min — ABNORMAL LOW (ref >60.00)
Glucose, Bld: 89 mg/dL (ref 70–99)
Potassium: 4.3 mEq/L (ref 3.5–5.1)
Sodium: 134 mEq/L — ABNORMAL LOW (ref 135–145)

## 2019-03-28 LAB — CBC WITH DIFFERENTIAL/PLATELET
Basophils Absolute: 0.1 10*3/uL (ref 0.0–0.1)
Basophils Relative: 0.8 % (ref 0.0–3.0)
Eosinophils Absolute: 0.2 10*3/uL (ref 0.0–0.7)
Eosinophils Relative: 1.5 % (ref 0.0–5.0)
HCT: 39.7 % (ref 36.0–46.0)
Hemoglobin: 13.1 g/dL (ref 12.0–15.0)
Lymphocytes Relative: 24.1 % (ref 12.0–46.0)
Lymphs Abs: 2.4 10*3/uL (ref 0.7–4.0)
MCHC: 32.9 g/dL (ref 30.0–36.0)
MCV: 90.6 fl (ref 78.0–100.0)
Monocytes Absolute: 0.9 10*3/uL (ref 0.1–1.0)
Monocytes Relative: 8.8 % (ref 3.0–12.0)
Neutro Abs: 6.5 10*3/uL (ref 1.4–7.7)
Neutrophils Relative %: 64.8 % (ref 43.0–77.0)
Platelets: 310 10*3/uL (ref 150.0–400.0)
RBC: 4.38 Mil/uL (ref 3.87–5.11)
RDW: 14.5 % (ref 11.5–15.5)
WBC: 10 10*3/uL (ref 4.0–10.5)

## 2019-03-28 LAB — VITAMIN D 25 HYDROXY (VIT D DEFICIENCY, FRACTURES): VITD: 36.99 ng/mL (ref 30.00–100.00)

## 2019-03-28 LAB — TSH: TSH: 2.85 u[IU]/mL (ref 0.35–4.50)

## 2019-03-28 MED ORDER — FUROSEMIDE 40 MG PO TABS: 20.0000 mg | ORAL_TABLET | Freq: Every day | ORAL | Status: DC

## 2019-03-28 NOTE — Patient Instructions (Addendum)
Go to the lab on the way out.  We'll contact you with your lab report. Don't change your meds for now.   See if you notice any change having stopped prednisone.  If no swelling next week, then try skipping furosemide and potassium for 1 days and see if you see any improvement.   Take care.  Glad to see you.

## 2019-03-28 NOTE — Progress Notes (Signed)
This visit occurred during the SARS-CoV-2 public health emergency.  Safety protocols were in place, including screening questions prior to the visit, additional usage of staff PPE, and extensive cleaning of exam room while observing appropriate contact time as indicated for disinfecting solutions.  Gait eval based on prev GI note. She "has a mediciney head" and she feels fatigued.  She is using a cane for stability.  She doesn't feel well in general and then she gets discouraged, "like I want to cry."  She seems to be worse in the AMs.  She can have a tremor that she attributed to being nervous.  She feels like she is more depressed and anxious. No SI/HI.   She is taking 20mg  lasix daily and BID potassium at baseline.    She just finished her weekly vit D replacement.  Due for recheck lab. D/w pt.  No ADE on med.    She is sleeping well at night except for nocturia.  She naps some in the day due to fatigue.  She still has some irregular changes in stools but not "explosive diarrhea" like prev. No blood in stools. She has less cramping than prev.    She is off prednisone as of yesterday.  Unclear if that contributed to any of the above.    She has some occ L lateral knee popping with ROM but now always.  She has seen ortho prev and also used a brace and "it's not bad enough to do anything about it yet."    No CP.  PMH and SH reviewed  ROS: Per HPI unless specifically indicated in ROS section   Meds, vitals, and allergies reviewed.   GEN: nad, alert and oriented HEENT: ncat NECK: supple w/o LA CV: rrr PULM: ctab, no inc wob ABD: soft, +bs EXT: no edema SKIN: no acute rash Walking with a cane.  She does not have an asymmetric gait or a shuffle.

## 2019-03-31 ENCOUNTER — Encounter: Payer: Self-pay | Admitting: Family Medicine

## 2019-03-31 MED ORDER — SERTRALINE HCL 50 MG PO TABS: ORAL_TABLET | ORAL | Status: DC

## 2019-03-31 MED ORDER — VITAMIN D 50 MCG (2000 UT) PO CAPS: 2000.0000 [IU] | ORAL_CAPSULE | Freq: Every day | ORAL | Status: DC

## 2019-03-31 NOTE — Assessment & Plan Note (Signed)
We talked about options.  A lot of her symptoms could be related to depression.  She is done with prednisone now.  Unclear if that was related to her recent symptoms. I think it makes sense for her to see if she notes any changes off of prednisone. We also talked about trying to gradually taper her Lasix, if tolerated. If no swelling next week, then she can try skipping furosemide and potassium for 1 day and see if she notes any changes. Assuming she is not feeling better in the meantime and assuming her labs are unremarkable then it may be completely reasonable to change her dose of sertraline.  Discussed.  Still okay for outpatient follow-up.  I want to see her labs first and hear how she responds to the above. >25 minutes spent in face to face time with patient, >50% spent in counselling or coordination of care

## 2019-04-01 ENCOUNTER — Other Ambulatory Visit: Payer: Self-pay | Admitting: *Deleted

## 2019-04-01 MED ORDER — SERTRALINE HCL 50 MG PO TABS: ORAL_TABLET | ORAL | 0 refills | Status: DC

## 2019-04-07 ENCOUNTER — Other Ambulatory Visit: Payer: Self-pay | Admitting: Family Medicine

## 2019-04-08 NOTE — Telephone Encounter (Signed)
Electronic refill request. Vitamin D 50,000 units Last office visit:   03/28/2019 Last Filled:   12 capsules on 01/17/2019 Please advise.

## 2019-04-09 NOTE — Telephone Encounter (Signed)
Patient is aware 

## 2019-04-09 NOTE — Telephone Encounter (Signed)
She can stop taking the weekly vitamin D and continue 2000 unit vitamin D tablets daily. She can get that over-the-counter.  Thanks.

## 2019-04-11 ENCOUNTER — Encounter: Payer: Self-pay | Admitting: Family Medicine

## 2019-04-11 MED ORDER — PREDNISONE 5 MG PO TABS: 20.0000 mg | ORAL_TABLET | Freq: Every day | ORAL | 0 refills | Status: DC

## 2019-04-11 NOTE — Telephone Encounter (Signed)
Spoke to her  Explained that budesonide is best tx for her condition "I am not going to pay $300/month for that medication"  Offered to try getting from [place] [facility]  Will retry prednisone She has f/u me 1/27  She will update me 1/5 - starting prednisone 20 mg qd

## 2019-04-28 ENCOUNTER — Encounter: Payer: Self-pay | Admitting: Family Medicine

## 2019-04-29 ENCOUNTER — Other Ambulatory Visit: Payer: Self-pay | Admitting: Family Medicine

## 2019-05-04 ENCOUNTER — Ambulatory Visit: Payer: Medicare Other | Attending: Internal Medicine

## 2019-05-04 DIAGNOSIS — Z23 Encounter for immunization: Secondary | ICD-10-CM | POA: Insufficient documentation

## 2019-05-04 NOTE — Progress Notes (Signed)
   Covid-19 Vaccination Clinic  Name:  ENDYA AUSTIN    MRN: [id number] DOB: [birth year]  05/04/2019  Ms. Doig was observed post Covid-19 immunization for 15 minutes without incidence. She was provided with Vaccine Information Sheet and instruction to access the V-Safe system.   Ms. Haws was instructed to call 911 with any severe reactions post vaccine: [name] Difficulty breathing  . Swelling of your face and throat  . A fast heartbeat  . A bad rash all over your body  . Dizziness and weakness    Immunizations Administered    Name Date Dose VIS Date Route   Pfizer COVID-19 Vaccine 05/04/2019 11:55 AM 0.3 mL 03/22/2019 Intramuscular   Manufacturer: [employer], [employer]   Lot: [id number]   NDC: [address]-0

## 2019-05-06 DIAGNOSIS — M25561 Pain in right knee: Secondary | ICD-10-CM | POA: Diagnosis not present

## 2019-05-06 DIAGNOSIS — M25562 Pain in left knee: Secondary | ICD-10-CM | POA: Diagnosis not present

## 2019-05-06 DIAGNOSIS — Z96659 Presence of unspecified artificial knee joint: Secondary | ICD-10-CM | POA: Diagnosis not present

## 2019-05-08 ENCOUNTER — Encounter: Payer: Self-pay | Admitting: Internal Medicine

## 2019-05-08 ENCOUNTER — Telehealth: Payer: Self-pay | Admitting: Family Medicine

## 2019-05-08 ENCOUNTER — Ambulatory Visit: Payer: Medicare Other | Admitting: Internal Medicine

## 2019-05-08 VITALS — BP 110/78 | HR 72 | Temp 97.2°F | Ht 65.0 in | Wt 165.0 lb

## 2019-05-08 DIAGNOSIS — K580 Irritable bowel syndrome with diarrhea: Secondary | ICD-10-CM | POA: Diagnosis not present

## 2019-05-08 DIAGNOSIS — Z9181 History of falling: Secondary | ICD-10-CM | POA: Diagnosis not present

## 2019-05-08 DIAGNOSIS — K52832 Lymphocytic colitis: Secondary | ICD-10-CM

## 2019-05-08 DIAGNOSIS — R2681 Unsteadiness on feet: Secondary | ICD-10-CM

## 2019-05-08 NOTE — Progress Notes (Signed)
[name] 86 y.o. Feb 27, 1931 [id number]  Assessment & Plan:   Encounter Diagnoses  Name Primary?  . Lymphocytic colitis Yes  . Irritable bowel syndrome with diarrhea   . Unsteady gait   . At risk for falling    Microscopic colitis Things are improved.  I think a lot of what she is having now are some underlying IBS symptoms.  I reminded her that budesonide is a better treatment for this.  It is $300 for a month supply I think which is cost prohibitive she says.  We do have the option of getting it from [place] $400 for an equivalent amount.  I explained that oftentimes people may not need the full 9 mg dose which would make the cost cheaper as well.  She is coming off her prednisone soon.   Note that she stopped furosemide and thinks that had a lot to do with her improvement because she was still having episodic problems when on the prednisone treatment.  I will admit sometimes I do not know how much of her problem is IBS versus the colitis.  This can be difficult to sort out in many patients.  She will follow-up as needed  IBS (irritable bowel syndrome) Restart peppermint oil extended release for the cramps and urgency type symptoms she has. Note that fiber supplementation made things worse for her.  At risk for falling She has had a couple of falls lately.  She is using a cane I do not know if she has any cerebellar issues or if she is just has some weakness and lower extremity problems.  She is seeing Ortho which is good I strongly recommended she call Dr. Para March and get follow-up about this as it sounds like things are worse for her lately.  She may need other assistive devices.  Question if physical therapy would be of benefit.   I appreciate the opportunity to care for this patient. CC: [name], MD Also copy to [name] MD   Subjective:   Chief Complaint: f/u lymphocytic colitis  HPI Several stools a day small amount Urge to defecate but no stool,  lower abdominal mild cramp - problems since last colonoscopy Avoiding heavy sweets/sugars IB Delene Ruffini quit using - has extended release peppermint oil Diarrhea much better since stopping furosemide in early January Fiber supplement made her worse  Overall satisfied w/ quality of life    ROS - unsteady ? Knees - had L TKR and having something pop out there and Dr. Yisroel Ramming said she would need surgery and she says no pain so not going to - had cortisone shot in R knee  Using a cane  Fell twice this week tripped both times  "I feel weak and unsteady" not every day  Did not sleep well last night ? From cortisone 2 d ago   Allergies  Allergen Reactions  . Ace Inhibitors Cough  . Warfarin Sodium Other (See Comments)    DOSE RELATED PHARMACOLOGIC EFFECT "bleed out"  . Famotidine     GI upset  . Codeine Other (See Comments)    sedation  . Delsym [Dextromethorphan Polistirex Er] Other (See Comments)    dizziness  . Lactose Intolerance (Gi) Diarrhea  . Phenylephrine Palpitations and Other (See Comments)    Nasal spray- "likely increase in nasal congestion".   . Sulfamethoxazole-Trimethoprim Nausea And Vomiting    GI intolerance.   Current Meds  Medication Sig  . acetaminophen (TYLENOL) 500 MG tablet Take 500 mg  by mouth 2 (two) times daily as needed for moderate pain or headache.   [name] apixaban (ELIQUIS) 2.5 MG TABS tablet Take 1 tablet (2.5 mg total) by mouth 2 (two) times daily.  . calcium carbonate (TUMS - DOSED IN MG ELEMENTAL CALCIUM) 500 MG chewable tablet Chew 1 tablet by mouth 3 (three) times daily as needed for indigestion or heartburn.   . Carboxymethylcellul-Glycerin ([employer] LUBRICATING/DRY EYE OP) Place 1-2 drops into both eyes daily as needed (for dry eyes).  . Cholecalciferol (VITAMIN D) 50 MCG (2000 UT) CAPS Take 1 capsule (2,000 Units total) by mouth daily.  . digoxin (LANOXIN) 0.125 MG tablet Take 1 tablet (0.125 mg total) by mouth daily.  [name] KLOR-CON M20 20 MEQ tablet TAKE  1 TABLET BY MOUTH TWICE A DAY  . loperamide (IMODIUM) 2 MG capsule Take 2 mg by mouth as needed for diarrhea or loose stools.  . metoprolol succinate (TOPROL-XL) 50 MG 24 hr tablet Take 1 tablet (50 mg total) by mouth daily. Take with or immediately following a meal.  . NON FORMULARY Hemp Salve topically  . Peppermint Oil (IBGARD) 90 MG CPCR Take by mouth.  . predniSONE (DELTASONE) 5 MG tablet Take 4 tablets (20 mg total) by mouth daily with breakfast.  . sertraline (ZOLOFT) 50 MG tablet TAKE 3 TABLETS (150 MG) BY MOUTH DAILY (Patient taking differently: Take 50 mg by mouth daily. TAKE 3 TABLETS (150 MG) BY MOUTH DAILY)  . traMADol (ULTRAM) 50 MG tablet Take 50 mg by mouth every 6 (six) hours as needed.   Past Medical History:  Diagnosis Date  . Acute combined systolic and diastolic heart failure ([place]) 05/02/2017  . Anxiety   . Arthritis    "knees" (06/18/2015)  . Atrial fibrillation ([place])    h/o sig bleed on coumadin  . Atrial fibrillation with RVR ([place]) 06/18/2015  . Cardiogenic shock ([place]) 05/02/2017  . CHF (congestive heart failure) ([place])   . Complication of anesthesia 2010   "w/cardiac cath; got into a psychotic state for ~ 3 days; got me out w/Valium"  . Depression    "I have it off and on; not as often as I've gotten older" (06/18/2015)  . Diverticulosis    descending and sigmoid colon--Dr. Sharlett Iles  . DJD (degenerative joint disease) of knee    left  . Dysrhythmia   . GERD (gastroesophageal reflux disease)   . Insomnia     off chronic ambien 5mg  as of 10/11, rare/episodic use since then  DCM/CHF  . Lymphocytic colitis 09/27/2016  . Nonischemic cardiomyopathy ([place])    normal coronaries, EF 15-20% 04/2008, EF 50-55% 2015  . Osteoporosis    on DXA 05/2010  . Sciatica    Right   Past Surgical History:  Procedure Laterality Date  . BIOPSY  11/22/2017   Procedure: BIOPSY;  Surgeon: Gatha Mayer, MD;  [facility];  Service: Endoscopy;;  . CARDIAC CATHETERIZATION  2010   . CARDIOVERSION N/A 06/19/2015   Procedure: CARDIOVERSION;  Surgeon: Larey Dresser, MD;  Location: [facility];  Service: Cardiovascular;  Laterality: N/A;  . CATARACT EXTRACTION W/ INTRAOCULAR LENS  IMPLANT, BILATERAL Bilateral   . COLONOSCOPY WITH PROPOFOL N/A 11/22/2017   Procedure: COLONOSCOPY WITH PROPOFOL;  Surgeon: Gatha Mayer, MD;  [facility];  Service: Endoscopy;  Laterality: N/A;  . DILATION AND CURETTAGE OF UTERUS    . TEE WITHOUT CARDIOVERSION N/A 06/19/2015   Procedure: TRANSESOPHAGEAL ECHOCARDIOGRAM (TEE);  Surgeon: Larey Dresser, MD;  [facility];  Service: Cardiovascular;  Laterality: N/A;  . TOTAL KNEE ARTHROPLASTY Left 04/25/2017   Procedure: TOTAL KNEE ARTHROPLASTY;  Surgeon: [name], MD;  Location[facility];  Service: Orthopedics;  Laterality: Left;  [name] VAGINAL HYSTERECTOMY  1979   ovaries intact   Social History   Social History Narrative   Retired, former [employer]   Married, 1953   Lives with husband   2 grown children, one in [place], one out of state   Tobacco Use - No.    Drug Use - no   teaches Sunday school, reading   family history includes Colon cancer in her father; Other in her father; Tuberculosis in her mother.   Review of Systems See HPI  Objective:   Physical Exam BP 110/78   Pulse 72   Temp (!) 97.2 F (36.2 C)   Ht 5\' 5"  (1.651 m)   Wt 165 lb (74.8 kg)   BMI 27.46 kg/m  NAD A and O x 3 Gait unsteady, slow ? Mildly + Romberg

## 2019-05-08 NOTE — Assessment & Plan Note (Addendum)
Restart peppermint oil extended release for the cramps and urgency type symptoms she has. Note that fiber supplementation made things worse for her.

## 2019-05-08 NOTE — Assessment & Plan Note (Addendum)
Things are improved.  I think a lot of what she is having now are some underlying IBS symptoms.  I reminded her that budesonide is a better treatment for this.  It is $300 for a month supply I think which is cost prohibitive she says.  We do have the option of getting it from [place] $400 for an equivalent amount.  I explained that oftentimes people may not need the full 9 mg dose which would make the cost cheaper as well.  She is coming off her prednisone soon.   Note that she stopped furosemide and thinks that had a lot to do with her improvement because she was still having episodic problems when on the prednisone treatment.  I will admit sometimes I do not know how much of her problem is IBS versus the colitis.  This can be difficult to sort out in many patients.  She will follow-up as needed

## 2019-05-08 NOTE — Telephone Encounter (Signed)
Called and scheduled the patient for a visit with the provider February 2,2021 about her gait and dizziness.  Guadalupe Kerekes,cma

## 2019-05-08 NOTE — Telephone Encounter (Signed)
Please see about getting patient scheduled for an office visit re: gait/fall risk/unsteadiness.  30 minutes if possible.  Thanks.

## 2019-05-08 NOTE — Assessment & Plan Note (Signed)
She has had a couple of falls lately.  She is using a cane I do not know if she has any cerebellar issues or if she is just has some weakness and lower extremity problems.  She is seeing Ortho which is good I strongly recommended she call Dr. Para March and get follow-up about this as it sounds like things are worse for her lately.  She may need other assistive devices.  Question if physical therapy would be of benefit.

## 2019-05-08 NOTE — Patient Instructions (Addendum)
Continue to use peppermint as needed.   Please call Dr. Para March to schedule appointment for unsteadiness and use cane.  Follow up as needed.  I appreciate the opportunity to care for you. Stan Head, MD, [facility]

## 2019-05-12 ENCOUNTER — Other Ambulatory Visit: Payer: Self-pay | Admitting: Cardiovascular Disease

## 2019-05-14 ENCOUNTER — Ambulatory Visit: Payer: Medicare Other | Admitting: Family Medicine

## 2019-05-21 ENCOUNTER — Encounter: Payer: Self-pay | Admitting: Family Medicine

## 2019-05-22 ENCOUNTER — Encounter: Payer: Self-pay | Admitting: Family Medicine

## 2019-05-25 ENCOUNTER — Ambulatory Visit: Payer: Medicare Other | Attending: Internal Medicine

## 2019-05-25 DIAGNOSIS — Z23 Encounter for immunization: Secondary | ICD-10-CM | POA: Insufficient documentation

## 2019-05-25 NOTE — Progress Notes (Signed)
   Covid-19 Vaccination Clinic  Name:  [name]    MRN: [id number] DOB: 01-01-1931  05/25/2019  [name] was observed post Covid-19 immunization for 15 minutes without incidence. She was provided with Vaccine Information Sheet and instruction to access the V-Safe system.   [name] was instructed to call 911 with any severe reactions post vaccine: [name] Difficulty breathing  . Swelling of your face and throat  . A fast heartbeat  . A bad rash all over your body  . Dizziness and weakness    Immunizations Administered    Name Date Dose VIS Date Route   Pfizer COVID-19 Vaccine 05/25/2019 11:15 AM 0.3 mL 03/22/2019 Intramuscular   Manufacturer: [employer], [employer]   Lot: [id number]   NDC: [address]-3

## 2019-05-29 DIAGNOSIS — H18593 Other hereditary corneal dystrophies, bilateral: Secondary | ICD-10-CM | POA: Diagnosis not present

## 2019-05-29 DIAGNOSIS — H04123 Dry eye syndrome of bilateral lacrimal glands: Secondary | ICD-10-CM | POA: Diagnosis not present

## 2019-05-29 DIAGNOSIS — H1789 Other corneal scars and opacities: Secondary | ICD-10-CM | POA: Diagnosis not present

## 2019-05-29 DIAGNOSIS — H5203 Hypermetropia, bilateral: Secondary | ICD-10-CM | POA: Diagnosis not present

## 2019-06-17 DIAGNOSIS — M1711 Unilateral primary osteoarthritis, right knee: Secondary | ICD-10-CM | POA: Diagnosis not present

## 2019-06-24 ENCOUNTER — Other Ambulatory Visit: Payer: Self-pay | Admitting: Family Medicine

## 2019-06-24 DIAGNOSIS — M1711 Unilateral primary osteoarthritis, right knee: Secondary | ICD-10-CM | POA: Diagnosis not present

## 2019-06-25 ENCOUNTER — Other Ambulatory Visit: Payer: Self-pay | Admitting: Cardiovascular Disease

## 2019-06-25 NOTE — Telephone Encounter (Signed)
Dr Leonides Sake note: "Therefore she should be on 5 mg of Eliquis twice daily. We talked about increasing the dose. However she is hesitant because she had spontaneous bleeding into her knee. Therefore she will continue on 2.5 mg."

## 2019-06-25 NOTE — Telephone Encounter (Signed)
80f 74.8kg Scr 0.92 03/28/19 Lovw/Supreme 02/05/19 Pt requesting 2.5 mg eliquis but qualifies for 5mg  based on wt and scr. I will route to pharmd to see if there is a specific indication as to why they need 2.5mg 

## 2019-07-01 DIAGNOSIS — M1711 Unilateral primary osteoarthritis, right knee: Secondary | ICD-10-CM | POA: Diagnosis not present

## 2019-07-05 ENCOUNTER — Ambulatory Visit: Payer: Medicare Other | Admitting: Family Medicine

## 2019-07-17 ENCOUNTER — Other Ambulatory Visit: Payer: Self-pay

## 2019-07-17 ENCOUNTER — Other Ambulatory Visit: Payer: Self-pay | Admitting: Cardiovascular Disease

## 2019-07-17 ENCOUNTER — Ambulatory Visit: Payer: Medicare Other | Admitting: Cardiovascular Disease

## 2019-07-17 VITALS — BP 132/80 | HR 86 | Ht 65.0 in | Wt 158.0 lb

## 2019-07-17 DIAGNOSIS — I10 Essential (primary) hypertension: Secondary | ICD-10-CM | POA: Diagnosis not present

## 2019-07-17 DIAGNOSIS — I4821 Permanent atrial fibrillation: Secondary | ICD-10-CM | POA: Diagnosis not present

## 2019-07-17 DIAGNOSIS — I5041 Acute combined systolic (congestive) and diastolic (congestive) heart failure: Secondary | ICD-10-CM | POA: Diagnosis not present

## 2019-07-17 DIAGNOSIS — I5042 Chronic combined systolic (congestive) and diastolic (congestive) heart failure: Secondary | ICD-10-CM | POA: Diagnosis not present

## 2019-07-17 MED ORDER — AMIODARONE HCL 200 MG PO TABS: 200.0000 mg | ORAL_TABLET | Freq: Every day | ORAL | 1 refills | Status: DC

## 2019-07-17 NOTE — Telephone Encounter (Signed)
New Message:   Pharmacist called and said there is an interaction between  Metoprolol, Amiodarone and Digoxin. Does Dr Duke Salvia still want pt to have Amiodarone filled?

## 2019-07-17 NOTE — Telephone Encounter (Signed)
Yes.  Besides, she will be stopping metoprolol in a week.

## 2019-07-17 NOTE — Patient Instructions (Addendum)
Medication Instructions:  DECREASE METOPROLOL TO 25 MG (1/2 OF THE 50 MG TABLET) DAILY FOR 1 WEEK AND THEN STOP   START AMIODARONE 200 MG DAILY   *If you need a refill on your cardiac medications before your next appointment, please call your pharmacy*  Lab Work: NONE   Testing/Procedures: NONE   Follow-Up:  You have been referred to HEART FAILURE CLINIC  Your physician wants you to follow-up in: 3 MONTHS WITH DR [facility] OR PA/NP  You will receive a reminder letter in the mail two months in advance. If you don't receive a letter, please call our office to schedule the follow-up appointment.

## 2019-07-17 NOTE — Progress Notes (Signed)
Cardiology Office Note   Date:  07/17/2019   ID:  [name], DOB 04-Mar-1931, MRN [id number]  PCP:  [name], MD  Cardiologist:   [name], MD  Cardiology APP: [name], [name]  No chief complaint on file.    History of Present Illness: [name] is a 86 y.o. female with chronic atrial fibrillation, chronic systolic and diastolic heart failure, left bundle branch block, spontaneous retroperitoneal bleed on Coumadin, and colitis who presents for follow-up. She is previously seen by Dr. Johnsie Cancel in clinic 03/2017 and cleared for surgery. At that time she was in atrial fibrillation with well-controlled rates. She underwent left knee replacement 04/2017. Her hospital course was complicated by atrial fibrillation with rapid ventricular response and acute systolic and diastolic heart failure. Echo that admission revealed LVEF 15% which was down from 50% in 2017. She was diuresed with IV Lasix and had a Lexiscan Myoview without evidence of ischemia. She failed amiodarone and was started on digoxin. She had a repeat echocardiogram 07/2017 that revealed LVEF 20 to 25%.  I just later did take orthostatics losartan was discontinued due to low BP and fatigue. Her fatigue has improved. She also struggled with diarrhea and stopped taking her digoxin in hopes that this would help. However the diarrhea did not improve. Digoxin was resumed.  She has declined ICD.  Ms. Bias saw her PCP 06/05/2018 and was in atrial fibrillation at that time.    Since her last appointment she continues to struggle with fatigue and dizziness.  She notes dizziness upon standing.  She feels tired constantly.  She has no dizziness when lying down.  She wonders if it could be related to dehydration.  Torsemide was discontinued because she thought it was causing diarrhea.  Initially they cut it in half and then she stopped it.  There was not really much improvement in her symptoms.  In fact,  it seems as though she may be getting more dizzy.  She has no lower extremity edema, orthopnea, or PND.  She has been taking prednisone for her colitis which seems to have helped both her stomach and her knees.  Past Medical History:  Diagnosis Date  . Acute combined systolic and diastolic heart failure ([place]) 05/02/2017  . Anxiety   . Arthritis    "knees" (06/18/2015)  . Atrial fibrillation ([place])    h/o sig bleed on coumadin  . Atrial fibrillation with RVR ([place]) 06/18/2015  . Cardiogenic shock ([place]) 05/02/2017  . CHF (congestive heart failure) ([place])   . Complication of anesthesia 2010   "w/cardiac cath; got into a psychotic state for ~ 3 days; got me out w/Valium"  . Depression    "I have it off and on; not as often as I've gotten older" (06/18/2015)  . Diverticulosis    descending and sigmoid colon--Dr. Sharlett Iles  . DJD (degenerative joint disease) of knee    left  . Dysrhythmia   . GERD (gastroesophageal reflux disease)   . Insomnia     off chronic ambien 5mg  as of 10/11, rare/episodic use since then  DCM/CHF  . Lymphocytic colitis 09/27/2016  . Nonischemic cardiomyopathy ([place])    normal coronaries, EF 15-20% 04/2008, EF 50-55% 2015  . Osteoporosis    on DXA 05/2010  . Sciatica    Right    Past Surgical History:  Procedure Laterality Date  . BIOPSY  11/22/2017   Procedure: BIOPSY;  Surgeon: Gatha Mayer, MD;  Location: [facility] ENDOSCOPY;  Service: Endoscopy;;  . CARDIAC CATHETERIZATION  2010  . CARDIOVERSION N/A 06/19/2015   Procedure: CARDIOVERSION;  Surgeon: Laurey Morale, MD;  Location[facility] ENDOSCOPY;  Service: Cardiovascular;  Laterality: N/A;  . CATARACT EXTRACTION W/ INTRAOCULAR LENS  IMPLANT, BILATERAL Bilateral   . COLONOSCOPY WITH PROPOFOL N/A 11/22/2017   Procedure: COLONOSCOPY WITH PROPOFOL;  Surgeon: Iva Boop, MD;  Location: [facility] ENDOSCOPY;  Service: Endoscopy;  Laterality: N/A;  . DILATION AND CURETTAGE OF UTERUS    . TEE WITHOUT CARDIOVERSION N/A 06/19/2015    Procedure: TRANSESOPHAGEAL ECHOCARDIOGRAM (TEE);  Surgeon: Laurey Morale, MD;  Location: [facility] ENDOSCOPY;  Service: Cardiovascular;  Laterality: N/A;  . TOTAL KNEE ARTHROPLASTY Left 04/25/2017   Procedure: TOTAL KNEE ARTHROPLASTY;  Surgeon: [name], MD;  Location: [facility] OR;  Service: Orthopedics;  Laterality: Left;  [name] VAGINAL HYSTERECTOMY  1979   ovaries intact     Current Outpatient Medications  Medication Sig Dispense Refill  . acetaminophen (TYLENOL) 500 MG tablet Take 500 mg by mouth 2 (two) times daily as needed for moderate pain or headache.     . calcium carbonate (TUMS - DOSED IN MG ELEMENTAL CALCIUM) 500 MG chewable tablet Chew 1 tablet by mouth 3 (three) times daily as needed for indigestion or heartburn.     . Carboxymethylcellul-Glycerin ([employer] LUBRICATING/DRY EYE OP) Place 1-2 drops into both eyes daily as needed (for dry eyes).    . Cholecalciferol (VITAMIN D) 50 MCG (2000 UT) CAPS Take 1 capsule (2,000 Units total) by mouth daily.    . digoxin (LANOXIN) 0.125 MG tablet Take 1 tablet (0.125 mg total) by mouth daily. 90 tablet 3  . ELIQUIS 2.5 MG TABS tablet TAKE 1 TABLET BY MOUTH TWICE A DAY 180 tablet 1  . KLOR-CON M20 20 MEQ tablet TAKE 1 TABLET BY MOUTH TWICE A DAY 180 tablet 2  . loperamide (IMODIUM) 2 MG capsule Take 2 mg by mouth as needed for diarrhea or loose stools.    . metoprolol succinate (TOPROL-XL) 50 MG 24 hr tablet Take 1 tablet (50 mg total) by mouth daily. Take with or immediately following a meal. 90 tablet 3  . NON FORMULARY Hemp Salve topically    . Peppermint Oil (IBGARD) 90 MG CPCR Take by mouth.    . predniSONE (DELTASONE) 5 MG tablet Take 4 tablets (20 mg total) by mouth daily with breakfast. 120 tablet 0  . sertraline (ZOLOFT) 50 MG tablet TAKE 3 TABLETS BY MOUTH EVERY DAY 270 tablet 1  . traMADol (ULTRAM) 50 MG tablet Take 50 mg by mouth every 6 (six) hours as needed.     No current facility-administered medications for this visit.    Allergies:    Ace inhibitors, Warfarin sodium, Famotidine, Codeine, Delsym [dextromethorphan polistirex er], Lactose intolerance (gi), Phenylephrine, and Sulfamethoxazole-trimethoprim    Social History:  The patient  reports that she has never smoked. She has never used smokeless tobacco. She reports that she does not drink alcohol or use drugs.   Family History:  The patient's family history includes Colon cancer in her father; Other in her father; Tuberculosis in her mother.    ROS:  Please see the history of present illness.   Otherwise, review of systems are positive for none.   All other systems are reviewed and negative.    PHYSICAL EXAM: VS:  BP 132/80   Pulse 86   Ht 5\' 5"  (1.651 m)   Wt 158 lb (71.7 kg)   SpO2 95%  BMI 26.29 kg/m  , BMI Body mass index is 26.29 kg/m. GENERAL:  Well appearing HEENT: Pupils equal round and reactive, fundi not visualized, oral mucosa unremarkable NECK:  No jugular venous distention, waveform within normal limits, carotid upstroke brisk and symmetric, no bruits LUNGS:  Clear to auscultation bilaterally HEART:  Irregularly irregular.  PMI not displaced or sustained,S1 and S2 within normal limits, no S3, no S4, no clicks, no rubs, II/VI systolic murmur at the apex ABD:  Flat, positive bowel sounds normal in frequency in pitch, no bruits, no rebound, no guarding, no midline pulsatile mass, no hepatomegaly, no splenomegaly EXT:  2 plus pulses throughout, no edema, no cyanosis no clubbing SKIN:  No rashes no nodules NEURO:  Cranial nerves II through XII grossly intact, motor grossly intact throughout PSYCH:  Cognitively intact, oriented to person place and time   EKG:  EKG is ordered today. 02/01/2018: Atrial fibrillation.  Rate 70 bpm.  Right axis deviation.  IVCD. 02/05/2019: Atrial fibrillation.  Rate 88 bpm.  Left axis deviation.  Left bundle branch block. 07/17/19: Atrial fibrillation.  Rate 86 bpm.  LBBB.  Echo 04/30/17: Study Conclusions  - Left  ventricle: The cavity size was moderately dilated. Wall   thickness was normal. Systolic function was severely reduced. The   estimated ejection fraction was 15%. No mural thrombus with   Definity contrast. Diffuse hypokinesis. The study is not   technically sufficient to allow evaluation of LV diastolic   function. - Aortic valve: Mildly calcified leaflets. There was no stenosis.   There was no regurgitation. - Mitral valve: Mildly thickened leaflets . There was moderate   regurgitation. - Left atrium: Severely dilated. - Right ventricle: The cavity size was mildly dilated. Mildly   reduced systolic function. - Right atrium: Severely dilated. - Tricuspid valve: There was moderate to severe regurgitation. - Pulmonary arteries: PA peak pressure: 45 mm Hg (S). - Inferior vena cava: The vessel was dilated. The respirophasic   diameter changes were blunted (< 50%), consistent with elevated   central venous pressure. - Pericardium, extracardiac: There was a left pleural effusion.  Echo 08/03/17: Study Conclusions  - Left ventricle: The cavity size was normal. Wall thickness was   increased in a pattern of mild LVH. Systolic function was   severely reduced. The estimated ejection fraction was in the   range of 20% to 25%. Diffuse hypokinesis. There is dyskinesis of   the anteroseptal myocardium. The study is not technically   sufficient to allow evaluation of LV diastolic function. - Aortic root: The aortic root was normal in size. - Mitral valve: Calcified annulus. - Left atrium: The atrium was moderately dilated. - Pulmonary arteries: Systolic pressure was mildly to moderately   increased.  Impressions:  - Diffuse hypokinesis with septal dyskinesis; overall severely   reduced LV systolic function; mild LVH; moderate LAE; mild TR;   mild to moderate pulmonary hypertension.   Recent Labs: 12/27/2018: ALT 7 03/28/2019: BUN 13; Creatinine, Ser 0.92; Hemoglobin 13.1; Platelets  310.0; Potassium 4.3; Sodium 134; TSH 2.85    Lipid Panel    Component Value Date/Time   CHOL 261 (H) 12/27/2018 1657   CHOL 156 02/05/2018 0937   TRIG (H) 12/27/2018 1657    424.0 Triglyceride is over 400; calculations on Lipids are invalid.   HDL 32.20 (L) 12/27/2018 1657   HDL 58 02/05/2018 0937   CHOLHDL 8 12/27/2018 1657   VLDL 30.2 01/30/2017 1451   LDLCALC 73 02/05/2018 9675  LDLDIRECT 58.0 12/27/2018 1657      Wt Readings from Last 3 Encounters:  07/17/19 158 lb (71.7 kg)  05/08/19 165 lb (74.8 kg)  03/28/19 162 lb 7 oz (73.7 kg)      ASSESSMENT AND PLAN:  # Chronic systolic and diastolic heart failure: LVEF 15% improved to 20-25%. She is euvolemic on exam but continues to have a lot of fatigue and dizziness.  It is unclear whether her beta-blocker may be contributing.  We will try reducing metoprolol to 25 mg daily for a week and then discontinue it.  Given her systolic dysfunction she really should be on a beta-blocker.  If her symptoms do not improve off of metoprolol, then we will plan to restart it.  However, if she does have improvement we would plan to start carvedilol instead. I am concerned that her heart rate might go too fast while she is off metoprolol.  We will start amiodarone 200 mg daily.  Continue digoxin.  She is not on an ACE-I/ARB due to hypotension.  Blood pressure is slightly elevated today.  At home it has been in the 110s.  Continue to monitor.  We will have her seen by the advanced heart failure clinic.  She does have a left bundle branch block and could potentially benefit from CRT-P.  She is not interested in a defibrillator.  Orthostatic vital signs were unremarkable.   # Hypertension: BP stable. Reducing metoprolol as above.  # Chronic atrial fibrillation: Continue Eliquis and digoxin.  Stop metoprolol for now as above. Although her age is greater than 80, her weight is not less than 60 kg and her creatinine is less than 1.5. Therefore she  should be on 5 mg of Eliquis twice daily. We talked about increasing the dose. However she is hesitant because she had spontaneous bleeding into her knee and falls. Therefore she will continue on 2.5 mg.  Digoxin level OK 12/2018.  Starting amiodarone for rate control as above.   # Depression: She continues to struggle  Current medicines are reviewed at length with the patient today.  The patient has concerns regarding medicines.  The following changes have been made:  Reduce lasix to 20mg .    Labs/ tests ordered today include:   No orders of the defined types were placed in this encounter.    Disposition:   FU with Teri Diltz C. , MD, [facility] in 3 months.  Refer to Advanced HF Clinic    Signed, Carlyne Keehan C. [facility], MD, [facility]  07/17/2019 2:15 PM    [facility]

## 2019-07-17 NOTE — Telephone Encounter (Signed)
[name], did cancel Rx for Metoprolol

## 2019-07-29 ENCOUNTER — Ambulatory Visit: Payer: Medicare Other | Admitting: Internal Medicine

## 2019-07-29 ENCOUNTER — Encounter: Payer: Self-pay | Admitting: Internal Medicine

## 2019-07-29 VITALS — BP 140/70 | HR 68 | Temp 97.4°F | Ht 65.0 in | Wt 161.4 lb

## 2019-07-29 DIAGNOSIS — K52832 Lymphocytic colitis: Secondary | ICD-10-CM

## 2019-07-29 DIAGNOSIS — F329 Major depressive disorder, single episode, unspecified: Secondary | ICD-10-CM

## 2019-07-29 DIAGNOSIS — F32A Depression, unspecified: Secondary | ICD-10-CM

## 2019-07-29 DIAGNOSIS — K580 Irritable bowel syndrome with diarrhea: Secondary | ICD-10-CM

## 2019-07-29 MED ORDER — PREDNISONE 5 MG PO TABS: ORAL_TABLET | ORAL | 0 refills | Status: DC

## 2019-07-29 NOTE — Assessment & Plan Note (Signed)
See Dr. Para March to consider change in Tx

## 2019-07-29 NOTE — Assessment & Plan Note (Signed)
I do not think this is an anctive problme - see IBS Taper off prednisone

## 2019-07-29 NOTE — Patient Instructions (Addendum)
We have sent the following medications to your pharmacy for you to pick up at your convenience: Prednisone   Take 5mg  of prednisone every other day for 5 days.   Please make sure to schedule a follow-up with Dr.Duncan.  If you are age 84 or older, your body mass index should be between 23-30. Your Body mass index is 26.86 kg/m. If this is out of the aforementioned range listed, please consider follow up with your Primary Care Provider.  If you are age 63 or younger, your body mass index should be between 19-25. Your Body mass index is 26.86 kg/m. If this is out of the aformentioned range listed, please consider follow up with your Primary Care Provider.    Thank you for choosing me and [facility].  Dr. 05-14-1967

## 2019-07-29 NOTE — Assessment & Plan Note (Signed)
I think this is more of her problem as opposed to lymphocytic colitis (should be daily watery diarrhea if active and not having this - though loperamide could confound I doubt it)  Based upon her hx I think she would benefit form pelvic floor PT and there are excellent PTs at [facility] and in [place] that do this. She is depressed it seems - and I think that may be why she is declining treatment such as this but is her perogative.  Will taper off prednisone and see me prn Continue diet restrictions and prn loperamide

## 2019-07-29 NOTE — Progress Notes (Signed)
[name] 86 y.o. 08/15/1930 [id number]  Assessment & Plan:   IBS (irritable bowel syndrome) I think this is more of her problem as opposed to lymphocytic colitis (should be daily watery diarrhea if active and not having this - though loperamide could confound I doubt it)  Based upon her hx I think she would benefit form pelvic floor PT and there are excellent PTs at [facility] and in [place] that do this. She is depressed it seems - and I think that may be why she is declining treatment such as this but is her perogative.  Will taper off prednisone and see me prn Continue diet restrictions and prn loperamide  Microscopic colitis I do not think this is an anctive problme - see IBS Taper off prednisone  Depression See Dr. Para March to consider change in Tx   IE:PPIRJJ, Dwana Curd, MD   Subjective:   Chief Complaint: colitis  HPI [name] is here for follow-up of IBS and lymphocytic colitis.  She was diagnosed with lymphocytic colitis in 2019 at which point she had watery diarrhea and she was treated with steroids at that time and had a very nice response.  I believe will use budesonide then.  She has had episodic problems ever since off and on and she is now on treatment with prednisone about a month ago starting at 20 mg daily and on 5 mg a day.  However she essentially still has to take Imodium and will use several doses in a day.  She feels like she has incomplete defecation stools are sometimes formed often soft and mushy.  She gets urgent defecation at times.  She is avoiding roughage and eating only cooked vegetables and limiting her salads.  She had a bad episode after eating and granddaughters birthday cake which was chocolate and full of sugary icing the other day.  She also has nocturia and urinary frequency and stress urinary incontinence  Other issues are that she is more withdrawn, unsteady on feet (uses a cane) and has anhedonia. She has been considering changing  antidepressants. Cardiology has stopped metoprolol and started amiodarone. Allergies  Allergen Reactions  . Ace Inhibitors Cough  . Warfarin Sodium Other (See Comments)    DOSE RELATED PHARMACOLOGIC EFFECT "bleed out"  . Famotidine     GI upset  . Codeine Other (See Comments)    sedation  . Delsym [Dextromethorphan Polistirex Er] Other (See Comments)    dizziness  . Lactose Intolerance (Gi) Diarrhea  . Phenylephrine Palpitations and Other (See Comments)    Nasal spray- "likely increase in nasal congestion".   . Sulfamethoxazole-Trimethoprim Nausea And Vomiting    GI intolerance.   Current Meds  Medication Sig  . acetaminophen (TYLENOL) 500 MG tablet Take 500 mg by mouth 2 (two) times daily as needed for moderate pain or headache.   [name] amiodarone (PACERONE) 200 MG tablet Take 1 tablet (200 mg total) by mouth daily.  . calcium carbonate (TUMS - DOSED IN MG ELEMENTAL CALCIUM) 500 MG chewable tablet Chew 1 tablet by mouth 3 (three) times daily as needed for indigestion or heartburn.   . Carboxymethylcellul-Glycerin ([employer] LUBRICATING/DRY EYE OP) Place 1-2 drops into both eyes daily as needed (for dry eyes).  . Cholecalciferol (VITAMIN D) 50 MCG (2000 UT) CAPS Take 1 capsule (2,000 Units total) by mouth daily.  . digoxin (LANOXIN) 0.125 MG tablet Take 1 tablet (0.125 mg total) by mouth daily.  [name] ELIQUIS 2.5 MG TABS tablet TAKE 1 TABLET BY MOUTH  TWICE A DAY  . loperamide (IMODIUM) 2 MG capsule Take 2 mg by mouth as needed for diarrhea or loose stools.  . NON FORMULARY Hemp Salve topically  . potassium chloride SA (KLOR-CON) 20 MEQ tablet Take 20 mEq by mouth daily.  . sertraline (ZOLOFT) 50 MG tablet TAKE 3 TABLETS BY MOUTH EVERY DAY (Patient taking differently: Take 100 mg by mouth daily. )  . traMADol (ULTRAM) 50 MG tablet Take 50 mg by mouth every 6 (six) hours as needed.  . [DISCONTINUED] Peppermint Oil (IBGARD) 90 MG CPCR Take by mouth.  . [DISCONTINUED] predniSONE (DELTASONE) 5 MG  tablet Take 4 tablets (20 mg total) by mouth daily with breakfast. (Patient taking differently: Take 5 mg by mouth daily with breakfast. )   Past Medical History:  Diagnosis Date  . Acute combined systolic and diastolic heart failure ([place]) 05/02/2017  . Anxiety   . Arthritis    "knees" (06/18/2015)  . Atrial fibrillation ([place])    h/o sig bleed on coumadin  . Atrial fibrillation with RVR ([place]) 06/18/2015  . Cardiogenic shock ([place]) 05/02/2017  . CHF (congestive heart failure) ([place])   . Complication of anesthesia 2010   "w/cardiac cath; got into a psychotic state for ~ 3 days; got me out w/Valium"  . Depression    "I have it off and on; not as often as I've gotten older" (06/18/2015)  . Diverticulosis    descending and sigmoid colon--Dr. Sharlett Iles  . DJD (degenerative joint disease) of knee    left  . Dysrhythmia   . GERD (gastroesophageal reflux disease)   . Insomnia     off chronic ambien 5mg  as of 10/11, rare/episodic use since then  DCM/CHF  . Lymphocytic colitis 09/27/2016  . Nonischemic cardiomyopathy ([place])    normal coronaries, EF 15-20% 04/2008, EF 50-55% 2015  . Osteoporosis    on DXA 05/2010  . Sciatica    Right   Past Surgical History:  Procedure Laterality Date  . BIOPSY  11/22/2017   Procedure: BIOPSY;  Surgeon: Gatha Mayer, MD;  Location: [facility] ENDOSCOPY;  Service: Endoscopy;;  . CARDIAC CATHETERIZATION  2010  . CARDIOVERSION N/A 06/19/2015   Procedure: CARDIOVERSION;  Surgeon: Larey Dresser, MD;  Location: [place];  Service: Cardiovascular;  Laterality: N/A;  . CATARACT EXTRACTION W/ INTRAOCULAR LENS  IMPLANT, BILATERAL Bilateral   . COLONOSCOPY WITH PROPOFOL N/A 11/22/2017   Procedure: COLONOSCOPY WITH PROPOFOL;  Surgeon: Gatha Mayer, MD;  [facility]: [facility] END[facility]COPY;  Service: Endoscopy;  Laterality: N/A;  . DILATION AND CURETTAGE OF UTERUS    . TEE WITHOUT CARDIOVERSION N/A 06/19/2015   Procedure: TRANSESOPHAGEAL ECHOCARDIOGRAM (TEE);  Surgeon: Larey Dresser, MD;   Location: [place];  Service: Cardiovascular;  Laterality: N/A;  . TOTAL KNEE ARTHROPLASTY Left 04/25/2017   Procedure: TOTAL KNEE ARTHROPLASTY;  Surgeon: Melrose Nakayama, MD;  Location: [place];  Service: Orthopedics;  Laterality: Left;  [name] VAGINAL HYSTERECTOMY  1979   ovaries intact   Social History   Social History Narrative   Retired, former [employer]   Married, 1953   Lives with husband   2 grown children, one in [place], one out of state   Tobacco Use - No.    Drug Use - no   teaches Sunday school, reading   family history includes Colon cancer in her father; Other in her father; Tuberculosis in her mother.   Review of Systems As above  Objective:   Physical Exam BP 140/70  Pulse 68 Comment: irregular  Temp (!) 97.4 F (36.3 C)   Ht 5\' 5"  (1.651 m)   Wt 161 lb 6.4 oz (73.2 kg)   BMI 26.86 kg/m    23 mins total time

## 2019-07-31 ENCOUNTER — Telehealth: Payer: Self-pay | Admitting: Family Medicine

## 2019-07-31 NOTE — Telephone Encounter (Signed)
I saw the patient went to the GI clinic recently.  If her mood is worse in the meantime then I think it makes sense to either consider changing treatment with either follow-up here or with psychiatry.  Please offer this the patient.  Thanks.

## 2019-08-01 ENCOUNTER — Other Ambulatory Visit: Payer: Self-pay

## 2019-08-01 ENCOUNTER — Encounter: Payer: Self-pay | Admitting: Family Medicine

## 2019-08-01 ENCOUNTER — Ambulatory Visit (INDEPENDENT_AMBULATORY_CARE_PROVIDER_SITE_OTHER): Payer: Medicare Other | Admitting: Family Medicine

## 2019-08-01 VITALS — BP 132/80 | HR 67 | Temp 97.1°F | Ht 65.0 in | Wt 162.1 lb

## 2019-08-01 DIAGNOSIS — R32 Unspecified urinary incontinence: Secondary | ICD-10-CM

## 2019-08-01 DIAGNOSIS — R300 Dysuria: Secondary | ICD-10-CM | POA: Diagnosis not present

## 2019-08-01 LAB — POC URINALSYSI DIPSTICK (AUTOMATED)
Bilirubin, UA: NEGATIVE
Glucose, UA: NEGATIVE
Ketones, UA: NEGATIVE
Nitrite, UA: POSITIVE
Protein, UA: NEGATIVE
Spec Grav, UA: 1.025 (ref 1.010–1.025)
Urobilinogen, UA: 0.2 E.U./dL
pH, UA: 6 (ref 5.0–8.0)

## 2019-08-01 MED ORDER — CEPHALEXIN 250 MG PO CAPS: 250.0000 mg | ORAL_CAPSULE | Freq: Two times a day (BID) | ORAL | 0 refills | Status: DC

## 2019-08-01 MED ORDER — SERTRALINE HCL 50 MG PO TABS: 100.0000 mg | ORAL_TABLET | Freq: Every day | ORAL | Status: DC

## 2019-08-01 NOTE — Telephone Encounter (Signed)
Patient came in for OV and this was addressed at that time.

## 2019-08-01 NOTE — Patient Instructions (Signed)
Drink plenty of water and start the antibiotics today.  We'll contact you with your lab report.  Take care.   If you aren't feeling better then let me know.

## 2019-08-01 NOTE — Progress Notes (Signed)
This visit occurred during the SARS-CoV-2 public health emergency.  Safety protocols were in place, including screening questions prior to the visit, additional usage of staff PPE, and extensive cleaning of exam room while observing appropriate contact time as indicated for disinfecting solutions.  Dysuria: yes, more trouble with incontinence recently.  She has urgency and nocturia.  She is wearing pads at baseline.   duration of symptoms: worse in the last few months.  abdominal pain: no fevers:no back pain:no Vomiting:no U/a d/w pt at OV.   She has seen GI clinic re: diarrhea.  dx'd with IBS and still with some diarrhea. IBS isn't a new issue for patient.  She is taking imodium and trying to monitor her diet.    Her mood is lower, per patient likely exacerbated by the above.  Discussed.  She is still on sertraline 100mg  a day.  She wanted to address the urinary and bowel sx first.    She is still walking with a cane at baseline.    Meds, vitals, and allergies reviewed.   Per HPI unless specifically indicated in ROS section   GEN: nad, alert and oriented HEENT: ncat NECK: supple CV: rrr.  PULM: ctab, no inc wob ABD: soft, +bs, suprapubic area not tender EXT: no edema SKIN: well perfused.  BACK: no CVA pain

## 2019-08-03 ENCOUNTER — Emergency Department (HOSPITAL_COMMUNITY): Payer: Medicare Other

## 2019-08-03 ENCOUNTER — Other Ambulatory Visit: Payer: Self-pay

## 2019-08-03 ENCOUNTER — Inpatient Hospital Stay (HOSPITAL_COMMUNITY)
Admission: EM | Admit: 2019-08-03 | Discharge: 2019-08-12 | DRG: 522 | Disposition: A | Discharge status: Home Health | Payer: Medicare Other | Attending: Family Medicine | Admitting: Family Medicine

## 2019-08-03 DIAGNOSIS — Z419 Encounter for procedure for purposes other than remedying health state, unspecified: Secondary | ICD-10-CM

## 2019-08-03 DIAGNOSIS — S72002A Fracture of unspecified part of neck of left femur, initial encounter for closed fracture: Secondary | ICD-10-CM

## 2019-08-03 DIAGNOSIS — F419 Anxiety disorder, unspecified: Secondary | ICD-10-CM | POA: Diagnosis present

## 2019-08-03 DIAGNOSIS — M810 Age-related osteoporosis without current pathological fracture: Secondary | ICD-10-CM | POA: Diagnosis present

## 2019-08-03 DIAGNOSIS — K589 Irritable bowel syndrome without diarrhea: Secondary | ICD-10-CM | POA: Diagnosis not present

## 2019-08-03 DIAGNOSIS — K219 Gastro-esophageal reflux disease without esophagitis: Secondary | ICD-10-CM | POA: Diagnosis not present

## 2019-08-03 DIAGNOSIS — I4811 Longstanding persistent atrial fibrillation: Secondary | ICD-10-CM | POA: Diagnosis not present

## 2019-08-03 DIAGNOSIS — I4821 Permanent atrial fibrillation: Secondary | ICD-10-CM | POA: Diagnosis present

## 2019-08-03 DIAGNOSIS — I5022 Chronic systolic (congestive) heart failure: Secondary | ICD-10-CM | POA: Diagnosis not present

## 2019-08-03 DIAGNOSIS — Z20822 Contact with and (suspected) exposure to covid-19: Secondary | ICD-10-CM | POA: Diagnosis not present

## 2019-08-03 DIAGNOSIS — Z79899 Other long term (current) drug therapy: Secondary | ICD-10-CM | POA: Diagnosis not present

## 2019-08-03 DIAGNOSIS — Z96649 Presence of unspecified artificial hip joint: Secondary | ICD-10-CM | POA: Diagnosis present

## 2019-08-03 DIAGNOSIS — I428 Other cardiomyopathies: Secondary | ICD-10-CM | POA: Diagnosis present

## 2019-08-03 DIAGNOSIS — D62 Acute posthemorrhagic anemia: Secondary | ICD-10-CM | POA: Diagnosis not present

## 2019-08-03 DIAGNOSIS — F32A Depression, unspecified: Secondary | ICD-10-CM | POA: Diagnosis present

## 2019-08-03 DIAGNOSIS — I071 Rheumatic tricuspid insufficiency: Secondary | ICD-10-CM | POA: Diagnosis present

## 2019-08-03 DIAGNOSIS — Z66 Do not resuscitate: Secondary | ICD-10-CM | POA: Diagnosis not present

## 2019-08-03 DIAGNOSIS — N390 Urinary tract infection, site not specified: Secondary | ICD-10-CM | POA: Diagnosis present

## 2019-08-03 DIAGNOSIS — F329 Major depressive disorder, single episode, unspecified: Secondary | ICD-10-CM | POA: Diagnosis present

## 2019-08-03 DIAGNOSIS — I4891 Unspecified atrial fibrillation: Secondary | ICD-10-CM | POA: Diagnosis present

## 2019-08-03 DIAGNOSIS — R000 Tachycardia, unspecified: Secondary | ICD-10-CM | POA: Diagnosis not present

## 2019-08-03 DIAGNOSIS — I447 Left bundle-branch block, unspecified: Secondary | ICD-10-CM | POA: Diagnosis not present

## 2019-08-03 DIAGNOSIS — W19XXXA Unspecified fall, initial encounter: Secondary | ICD-10-CM

## 2019-08-03 DIAGNOSIS — K580 Irritable bowel syndrome with diarrhea: Secondary | ICD-10-CM | POA: Diagnosis not present

## 2019-08-03 DIAGNOSIS — I361 Nonrheumatic tricuspid (valve) insufficiency: Secondary | ICD-10-CM | POA: Diagnosis not present

## 2019-08-03 DIAGNOSIS — Z96652 Presence of left artificial knee joint: Secondary | ICD-10-CM | POA: Diagnosis present

## 2019-08-03 DIAGNOSIS — I255 Ischemic cardiomyopathy: Secondary | ICD-10-CM | POA: Diagnosis not present

## 2019-08-03 DIAGNOSIS — N179 Acute kidney failure, unspecified: Secondary | ICD-10-CM | POA: Diagnosis not present

## 2019-08-03 DIAGNOSIS — S299XXA Unspecified injury of thorax, initial encounter: Secondary | ICD-10-CM | POA: Diagnosis not present

## 2019-08-03 DIAGNOSIS — I4819 Other persistent atrial fibrillation: Secondary | ICD-10-CM | POA: Diagnosis not present

## 2019-08-03 DIAGNOSIS — S79922A Unspecified injury of left thigh, initial encounter: Secondary | ICD-10-CM | POA: Diagnosis not present

## 2019-08-03 DIAGNOSIS — Z7901 Long term (current) use of anticoagulants: Secondary | ICD-10-CM

## 2019-08-03 DIAGNOSIS — R52 Pain, unspecified: Secondary | ICD-10-CM

## 2019-08-03 DIAGNOSIS — S72009A Fracture of unspecified part of neck of unspecified femur, initial encounter for closed fracture: Secondary | ICD-10-CM | POA: Diagnosis not present

## 2019-08-03 DIAGNOSIS — Z743 Need for continuous supervision: Secondary | ICD-10-CM | POA: Diagnosis not present

## 2019-08-03 DIAGNOSIS — Z03818 Encounter for observation for suspected exposure to other biological agents ruled out: Secondary | ICD-10-CM | POA: Diagnosis not present

## 2019-08-03 DIAGNOSIS — S72012A Unspecified intracapsular fracture of left femur, initial encounter for closed fracture: Principal | ICD-10-CM | POA: Diagnosis present

## 2019-08-03 DIAGNOSIS — M25552 Pain in left hip: Secondary | ICD-10-CM

## 2019-08-03 DIAGNOSIS — R0602 Shortness of breath: Secondary | ICD-10-CM

## 2019-08-03 DIAGNOSIS — W010XXA Fall on same level from slipping, tripping and stumbling without subsequent striking against object, initial encounter: Secondary | ICD-10-CM | POA: Diagnosis present

## 2019-08-03 DIAGNOSIS — I959 Hypotension, unspecified: Secondary | ICD-10-CM | POA: Diagnosis not present

## 2019-08-03 LAB — CBC
HCT: 44 % (ref 36.0–46.0)
Hemoglobin: 14.1 g/dL (ref 12.0–15.0)
MCH: 29.7 pg (ref 26.0–34.0)
MCHC: 32 g/dL (ref 30.0–36.0)
MCV: 92.8 fL (ref 80.0–100.0)
Platelets: 237 10*3/uL (ref 150–400)
RBC: 4.74 MIL/uL (ref 3.87–5.11)
RDW: 13.9 % (ref 11.5–15.5)
WBC: 12.4 10*3/uL — ABNORMAL HIGH (ref 4.0–10.5)
nRBC: 0 % (ref 0.0–0.2)

## 2019-08-03 LAB — URINE CULTURE
MICRO NUMBER:: 10394764
SPECIMEN QUALITY:: ADEQUATE

## 2019-08-03 LAB — BASIC METABOLIC PANEL
Anion gap: 10 (ref 5–15)
BUN: 13 mg/dL (ref 8–23)
CO2: 29 mmol/L (ref 22–32)
Calcium: 9.6 mg/dL (ref 8.9–10.3)
Chloride: 101 mmol/L (ref 98–111)
Creatinine, Ser: 0.81 mg/dL (ref 0.44–1.00)
GFR calc Af Amer: >60 mL/min (ref >60)
GFR calc non Af Amer: >60 mL/min (ref >60)
Glucose, Bld: 113 mg/dL — ABNORMAL HIGH (ref 70–99)
Potassium: 4.3 mmol/L (ref 3.5–5.1)
Sodium: 140 mmol/L (ref 135–145)

## 2019-08-03 LAB — RESPIRATORY PANEL BY RT PCR (FLU A&B, COVID)
Influenza A by PCR: NEGATIVE
Influenza B by PCR: NEGATIVE
SARS Coronavirus 2 by RT PCR: NEGATIVE

## 2019-08-03 LAB — PROTIME-INR
INR: 1.1 (ref 0.8–1.2)
Prothrombin Time: 14 seconds (ref 11.4–15.2)

## 2019-08-03 MED ORDER — ACETAMINOPHEN 500 MG PO TABS
1000.0000 mg | ORAL_TABLET | Freq: Once | ORAL | Status: DC
  Filled 2019-08-03: qty 2

## 2019-08-03 MED ORDER — FENTANYL CITRATE (PF) 100 MCG/2ML IJ SOLN
50.0000 ug | Freq: Once | INTRAMUSCULAR | Status: AC
  Administered 2019-08-04: 50 ug via INTRAVENOUS
  Filled 2019-08-03: qty 2

## 2019-08-03 MED ORDER — CYCLOBENZAPRINE HCL 10 MG PO TABS
5.0000 mg | ORAL_TABLET | Freq: Once | ORAL | Status: AC
  Administered 2019-08-03: 5 mg via ORAL
  Filled 2019-08-03: qty 1

## 2019-08-03 NOTE — ED Provider Notes (Signed)
[facility] EMERGENCY DEPARTMENT Provider Note   CSN: [id number] Arrival date & time: 08/03/19  2147     History Chief Complaint  Patient presents with  . Level 2    Fall on thinners    VENEDA NEWELL is a 86 y.o. female.  HPI   86 year old female with a past medical history of combined systolic and diastolic heart failure, arthritis, previous left knee replacement, A. fib on Eliquis, osteoporosis presenting to the emerge department following a witnessed nonsyncopal fall in which she described her left knee giving out falling onto her left side complaining of left hip pain.  Patient reports that she was playing with her granddaughter when her left knee gave out and she fell onto her left hip.  Patient had immediate pain to the left hip.  Patient has been unable to bear weight.  Patient did not hit her head.  Patient did not get knocked out.  Patient denies pain elsewhere to her body.  Patient notes having spasms in her left thigh.  Patient denies previous operation to the left hip.  Patient denies any recent illness, fevers, chills, cough, congestion, rhinorrhea, shortness of breath, chest pain, abdominal pain, change in bowel or bladder function.  Patient denies any sick contacts or Contacts.  Past Medical History:  Diagnosis Date  . Acute combined systolic and diastolic heart failure (HCC) 05/02/2017  . Anxiety   . Arthritis    "knees" (06/18/2015)  . Atrial fibrillation (HCC)    h/o sig bleed on coumadin  . Atrial fibrillation with RVR (HCC) 06/18/2015  . Cardiogenic shock (HCC) 05/02/2017  . CHF (congestive heart failure) (HCC)   . Complication of anesthesia 2010   "w/cardiac cath; got into a psychotic state for ~ 3 days; got me out w/Valium"  . Depression    "I have it off and on; not as often as I've gotten older" (06/18/2015)  . Diverticulosis    descending and sigmoid colon--Dr. Jarold Motto  . DJD (degenerative joint disease) of knee    left  . Dysrhythmia     . GERD (gastroesophageal reflux disease)   . Insomnia     off chronic ambien 5mg  as of 10/11, rare/episodic use since then  DCM/CHF  . Lymphocytic colitis 09/27/2016  . Nonischemic cardiomyopathy (HCC)    normal coronaries, EF 15-20% 04/2008, EF 50-55% 2015  . Osteoporosis    on DXA 05/2010  . Sciatica    Right    Patient Active Problem List   Diagnosis Date Noted  . Ear symptom 12/30/2018  . Chronic diarrhea 09/07/2017  . Hoarseness of voice 09/07/2017  . Total knee replacement status, left 04/25/17 05/06/2017  . Acute combined systolic and diastolic heart failure (HCC) 05/02/2017  . Cardiogenic shock (HCC) 05/02/2017  . Primary osteoarthritis of left knee 04/25/2017  . DJD (degenerative joint disease) 04/25/2017  . Healthcare maintenance 02/08/2017  . Vitamin D deficiency 02/08/2017  . Microscopic colitis 09/27/2016  . IBS (irritable bowel syndrome) 08/31/2016  . At risk for falling 01/07/2016  . Chronic combined systolic and diastolic heart failure (HCC)   . Rectus sheath hematoma- no anticoagulation   . Atrial fibrillation with RVR- CHADs VASc=4 06/18/2015  . Diarrhea 06/12/2015  . Advance care planning 10/23/2014  . Leg edema 06/27/2012  . Medicare annual wellness visit, subsequent 03/27/2012  . Depression 03/05/2012  . EDEMA 06/07/2010  . Osteoporosis 05/23/2010  . KNEE PAIN, LEFT 03/02/2010  . LBBB (left bundle branch block) 10/28/2008  . POST  MI SEPTAL DEFECT 06/24/2008  . Cough 06/24/2008  . Nonischemic cardiomyopathy (HCC) 05/30/2008  . ATRIAL FIBRILLATION 05/30/2008  . HYPONATREMIA, HX OF 05/30/2008    Past Surgical History:  Procedure Laterality Date  . BIOPSY  11/22/2017   Procedure: BIOPSY;  Surgeon: Iva Boop, MD;  Location: [facility] ENDOSCOPY;  Service: Endoscopy;;  . CARDIAC CATHETERIZATION  2010  . CARDIOVERSION N/A 06/19/2015   Procedure: CARDIOVERSION;  Surgeon: Laurey Morale, MD;  Location: [facility] ENDOSCOPY;  Service: Cardiovascular;  Laterality:  N/A;  . CATARACT EXTRACTION W/ INTRAOCULAR LENS  IMPLANT, BILATERAL Bilateral   . COLONOSCOPY WITH PROPOFOL N/A 11/22/2017   Procedure: COLONOSCOPY WITH PROPOFOL;  Surgeon: Iva Boop, MD;  Location: [facility] ENDOSCOPY;  Service: Endoscopy;  Laterality: N/A;  . DILATION AND CURETTAGE OF UTERUS    . TEE WITHOUT CARDIOVERSION N/A 06/19/2015   Procedure: TRANSESOPHAGEAL ECHOCARDIOGRAM (TEE);  Surgeon: Laurey Morale, MD;  Location: [facility] ENDOSCOPY;  Service: Cardiovascular;  Laterality: N/A;  . TOTAL KNEE ARTHROPLASTY Left 04/25/2017   Procedure: TOTAL KNEE ARTHROPLASTY;  Surgeon: [name], MD;  Location: [facility] OR;  Service: Orthopedics;  Laterality: Left;  [name] VAGINAL HYSTERECTOMY  1979   ovaries intact     OB History   No obstetric history on file.     Family History  Problem Relation Age of Onset  . Colon cancer Father        possible colon cancer  . Other Father        esophagus tumor?  . Tuberculosis Mother   . Breast cancer Neg Hx   . Stomach cancer Neg Hx   . Pancreatic cancer Neg Hx     Social History   Tobacco Use  . Smoking status: Never Smoker  . Smokeless tobacco: Never Used  Substance Use Topics  . Alcohol use: No    Alcohol/week: 0.0 standard drinks  . Drug use: No    Home Medications Prior to Admission medications   Medication Sig Start Date End Date Taking? Authorizing Provider  acetaminophen (TYLENOL) 500 MG tablet Take 500 mg by mouth 2 (two) times daily as needed for moderate pain or headache.     [provider]  amiodarone (PACERONE) 200 MG tablet Take 1 tablet (200 mg total) by mouth daily. 07/17/19   [name], MD  calcium carbonate (TUMS - DOSED IN MG ELEMENTAL CALCIUM) 500 MG chewable tablet Chew 1 tablet by mouth 3 (three) times daily as needed for indigestion or heartburn.     [provider]  Carboxymethylcellul-Glycerin ([employer] LUBRICATING/DRY EYE OP) Place 1-2 drops into both eyes daily as needed (for dry eyes).    [provider]  cephALEXin (KEFLEX) 250 MG capsule Take 1 capsule (250 mg total) by mouth 2 (two) times daily. 08/01/19   [name], MD  Cholecalciferol (VITAMIN D) 50 MCG (2000 UT) CAPS Take 1 capsule (2,000 Units total) by mouth daily. 03/31/19   [name], MD  digoxin (LANOXIN) 0.125 MG tablet Take 1 tablet (0.125 mg total) by mouth daily. 02/27/19   [name], MD  ELIQUIS 2.5 MG TABS tablet TAKE 1 TABLET BY MOUTH TWICE A DAY 06/25/19   [name], MD  loperamide (IMODIUM) 2 MG capsule Take 2 mg by mouth as needed for diarrhea or loose stools.    [provider]  NON FORMULARY Hemp Salve topically    [provider]  potassium chloride SA (KLOR-CON) 20 MEQ tablet Take 20 mEq by mouth daily.  [provider]  predniSONE (DELTASONE) 5 MG tablet Take 1 tablet( 5mg ) by mouth every other day for 5 days. 07/29/19   Gatha Mayer, MD  Probiotic Product (PROBIOTIC-10 PO) Take by mouth daily.    [provider]  sertraline (ZOLOFT) 50 MG tablet Take 2 tablets (100 mg total) by mouth daily. 08/01/19   [name], MD  traMADol (ULTRAM) 50 MG tablet Take 50 mg by mouth every 6 (six) hours as needed.    [provider]    Allergies    Ace inhibitors, Warfarin sodium, Famotidine, Codeine, Delsym [dextromethorphan polistirex er], Lactose intolerance (gi), Phenylephrine, and Sulfamethoxazole-trimethoprim  Review of Systems   Review of Systems  Constitutional: Negative for activity change, appetite change, chills, diaphoresis, fatigue and fever.  HENT: Negative for congestion and rhinorrhea.   Respiratory: Negative for cough, shortness of breath and wheezing.   Cardiovascular: Negative for chest pain.  Gastrointestinal: Negative for abdominal distention, abdominal pain, diarrhea, nausea and vomiting.  Genitourinary: Negative for decreased urine volume, difficulty urinating, dysuria, frequency and urgency.  Musculoskeletal:  Positive for arthralgias. Negative for back pain, gait problem, joint swelling and myalgias.  Skin: Negative for color change and wound.  Neurological: Negative for dizziness, syncope, weakness, light-headedness and headaches.  All other systems reviewed and are negative.   Physical Exam Updated Vital Signs BP (!) 128/104   Pulse (!) 113   Temp 99 F (37.2 C)   Resp 13   Ht 5\' 5"  (1.651 m)   Wt 72.6 kg   SpO2 92%   BMI 26.63 kg/m   Physical Exam Vitals and nursing note reviewed.  Constitutional:      General: She is not in acute distress.    Appearance: Normal appearance. She is normal weight. She is not ill-appearing.  HENT:     Head: Normocephalic.     Right Ear: External ear normal.     Left Ear: External ear normal.     Nose: Nose normal.     Mouth/Throat:     Mouth: Mucous membranes are moist.     Pharynx: Oropharynx is clear.  Eyes:     Extraocular Movements: Extraocular movements intact.  Cardiovascular:     Rate and Rhythm: Normal rate and regular rhythm.     Pulses: Normal pulses.     Heart sounds: Normal heart sounds.  Pulmonary:     Effort: Pulmonary effort is normal. No respiratory distress.     Breath sounds: Normal breath sounds. No wheezing or rhonchi.  Abdominal:     General: Bowel sounds are normal.     Palpations: Abdomen is soft.     Tenderness: There is no abdominal tenderness. There is no guarding.  Musculoskeletal:     Cervical back: Normal range of motion.     Right hip: Normal.     Left hip: Tenderness and bony tenderness present. Decreased range of motion.     Right upper leg: Normal.     Left upper leg: Tenderness and bony tenderness present. No swelling.     Right knee: Normal.     Left knee: Normal.     Right lower leg: Normal. No edema.     Left lower leg: Normal. No edema.     Right ankle: Normal.     Left ankle: Normal.     Comments: Left lower extremity externally rotated, no appreciable shortening, tenderness to palpation of  the left hip on lateral compression without overlying deformity, well-year-old anterior midline scar  overlying the left knee from previous total knee replacement.  Neurovascular intact in the distal left lower extremity with normal sensation in all the major nerve distributions and 2+ DP and PT pulses bilaterally.  Skin:    General: Skin is warm and dry.     Capillary Refill: Capillary refill takes less than 2 seconds.  Neurological:     General: No focal deficit present.     Mental Status: She is alert and oriented to person, place, and time. Mental status is at baseline.  Psychiatric:        Mood and Affect: Mood normal.     ED Results / Procedures / Treatments   Labs (all labs ordered are listed, but only abnormal results are displayed) Labs Reviewed  CBC - Abnormal; Notable for the following components:      Result Value   WBC 12.4 (*)    All other components within normal limits  BASIC METABOLIC PANEL - Abnormal; Notable for the following components:   Glucose, Bld 113 (*)    All other components within normal limits  RESPIRATORY PANEL BY RT PCR (FLU A&B, COVID)  PROTIME-INR    EKG EKG Interpretation  Date/Time:  Saturday August 03 2019 21:54:33 EDT Ventricular Rate:  112 PR Interval:    QRS Duration: 149 QT Interval:  393 QTC Calculation: 537 R Axis:   -62 Text Interpretation: Atrial fibrillation Left bundle branch block Confirmed by Blane Ohara [phone]) on 08/03/2019 10:29:57 PM   Radiology DG Hip Unilat With Pelvis 2-3 Views Left  Result Date: 08/03/2019 CLINICAL DATA:  86 year old female with fall and left hip deformity. EXAM: DG HIP (WITH OR WITHOUT PELVIS) 2-3V LEFT COMPARISON:  Left knee radiograph dated 08/03/2019. FINDINGS: There is a fracture of the left femoral neck. Evaluation however is limited due to advanced osteopenia. CT may provide better evaluation. There is no dislocation. The soft tissues are unremarkable. IMPRESSION: Fracture of the left femoral  neck. Electronically Signed   By: Elgie Collard M.D.   On: 08/03/2019 23:25   DG Femur Portable 1 View Left  Result Date: 08/03/2019 CLINICAL DATA:  Larey Seat EXAM: LEFT FEMUR PORTABLE 1 VIEW COMPARISON:  None. FINDINGS: Two frontal views of the left femur are obtained. Findings are suspicious for a left femoral neck fracture with impaction, though portable technique and body habitus severely limits the evaluation. Dedicated views of the left hip and pelvis are recommended. The distal aspect of the left femur is excluded by collimation. Femoral component of a left knee arthroplasty is partially visualized. IMPRESSION: 1. Findings suspicious for subcapital left femoral neck fracture, though images are nondiagnostic due to body habitus and technique. Dedicated views of the pelvis and left hip are recommended. Electronically Signed   By: Sharlet Salina M.D.   On: 08/03/2019 22:51    Procedures Procedures (including critical care time)  Medications Ordered in ED Medications  fentaNYL (SUBLIMAZE) injection 50 mcg (has no administration in time range)  acetaminophen (TYLENOL) tablet 1,000 mg (has no administration in time range)  cyclobenzaprine (FLEXERIL) tablet 5 mg (5 mg Oral Given 08/03/19 2233)    ED Course  I have reviewed the triage vital signs and the nursing notes.  Pertinent labs & imaging results that were available during my care of the patient were reviewed by me and considered in my medical decision making (see chart for details).    MDM Rules/Calculators/A&P  86 year old female with a past medical history of combined systolic and diastolic heart failure, arthritis, previous left knee replacement, A. fib on Eliquis, osteoporosis presenting to the emerge department following a witnessed nonsyncopal fall in which she described her left knee giving out falling onto her left side complaining of left hip pain.  Prior to arrival of the patient, the room was prepared  with the following: code cart to bedside, glidescope, suction x1, BVM.   Upon arrival of the patient, EMS provided pertinent history and exam findings. The patient was transferred over to the trauma bed. ABCs intact as exam above. Once 2 IVs were placed, the secondary exam was performed. Pertinent physical exam findings include external rotation of the left leg with tenderness to palpation of the left hip. Portable XRs performed at the bedside. eFAST exam not performed. The patient was then prepared and sent to the CT for full trauma scans. Patient started on IVF, IV antiemetics, and IV pain medications.   Radiographs of the pelvis, left hip, left femur demonstrated an acute left femoral neck fracture.  Labs demonstrated leukocytosis 12.4 consistent with recent trauma, no anemia, no thrombocytosis or thrombocytopenia, no significant derangements on BMP, coags within normal limits, INR 1.1  Consult placed orthopedic surgery for further evaluation consideration of operative management.  Per on-call orthopedic surgery, Dr. Carola Frost, will admit the patient to the hospital service for operative management in the next day or 2.  Should the patient be uncomfortable, traction is appropriate.  Patient can eat and drink tonight.  Will hold anticoagulation tomorrow.  Labs and imaging reviewed by myself and considered in medical decision making if ordered.  Imaging interpreted by radiology.  The plan for this patient was discussed with Dr. Jodi Mourning, who voiced agreement and who oversaw evaluation and treatment of this patient.  Final Clinical Impression(s) / ED Diagnoses Final diagnoses:  Fall, initial encounter  Left hip pain  Closed displaced fracture of left femoral neck [facility])    Rx / DC Orders ED Discharge Orders    None       Gracy Bruins, MD 08/03/19 [id number]    Blane Ohara, MD 08/04/19 [phone]

## 2019-08-03 NOTE — ED Triage Notes (Signed)
Pt c/o losing balance and falling. Pt currently taking Eliquis. Pt left left shortened and externally rotated.

## 2019-08-04 ENCOUNTER — Emergency Department (HOSPITAL_COMMUNITY): Payer: Medicare Other

## 2019-08-04 ENCOUNTER — Inpatient Hospital Stay (HOSPITAL_COMMUNITY): Payer: Medicare Other

## 2019-08-04 DIAGNOSIS — I4821 Permanent atrial fibrillation: Secondary | ICD-10-CM

## 2019-08-04 DIAGNOSIS — I361 Nonrheumatic tricuspid (valve) insufficiency: Secondary | ICD-10-CM | POA: Diagnosis not present

## 2019-08-04 DIAGNOSIS — I428 Other cardiomyopathies: Secondary | ICD-10-CM | POA: Diagnosis not present

## 2019-08-04 DIAGNOSIS — K589 Irritable bowel syndrome without diarrhea: Secondary | ICD-10-CM | POA: Diagnosis present

## 2019-08-04 DIAGNOSIS — S72002A Fracture of unspecified part of neck of left femur, initial encounter for closed fracture: Secondary | ICD-10-CM | POA: Diagnosis not present

## 2019-08-04 DIAGNOSIS — Z471 Aftercare following joint replacement surgery: Secondary | ICD-10-CM | POA: Diagnosis not present

## 2019-08-04 DIAGNOSIS — I4819 Other persistent atrial fibrillation: Secondary | ICD-10-CM

## 2019-08-04 DIAGNOSIS — F329 Major depressive disorder, single episode, unspecified: Secondary | ICD-10-CM

## 2019-08-04 DIAGNOSIS — K580 Irritable bowel syndrome with diarrhea: Secondary | ICD-10-CM

## 2019-08-04 DIAGNOSIS — F419 Anxiety disorder, unspecified: Secondary | ICD-10-CM | POA: Diagnosis present

## 2019-08-04 DIAGNOSIS — I447 Left bundle-branch block, unspecified: Secondary | ICD-10-CM | POA: Diagnosis present

## 2019-08-04 DIAGNOSIS — Z20822 Contact with and (suspected) exposure to covid-19: Secondary | ICD-10-CM | POA: Diagnosis not present

## 2019-08-04 DIAGNOSIS — N390 Urinary tract infection, site not specified: Secondary | ICD-10-CM

## 2019-08-04 DIAGNOSIS — S72009A Fracture of unspecified part of neck of unspecified femur, initial encounter for closed fracture: Secondary | ICD-10-CM | POA: Diagnosis present

## 2019-08-04 DIAGNOSIS — Z79899 Other long term (current) drug therapy: Secondary | ICD-10-CM | POA: Diagnosis not present

## 2019-08-04 DIAGNOSIS — Z66 Do not resuscitate: Secondary | ICD-10-CM | POA: Diagnosis not present

## 2019-08-04 DIAGNOSIS — Z96642 Presence of left artificial hip joint: Secondary | ICD-10-CM | POA: Diagnosis not present

## 2019-08-04 DIAGNOSIS — M810 Age-related osteoporosis without current pathological fracture: Secondary | ICD-10-CM | POA: Diagnosis present

## 2019-08-04 DIAGNOSIS — R300 Dysuria: Secondary | ICD-10-CM | POA: Diagnosis not present

## 2019-08-04 DIAGNOSIS — M25562 Pain in left knee: Secondary | ICD-10-CM | POA: Diagnosis not present

## 2019-08-04 DIAGNOSIS — R05 Cough: Secondary | ICD-10-CM | POA: Diagnosis not present

## 2019-08-04 DIAGNOSIS — R0602 Shortness of breath: Secondary | ICD-10-CM | POA: Diagnosis not present

## 2019-08-04 DIAGNOSIS — K219 Gastro-esophageal reflux disease without esophagitis: Secondary | ICD-10-CM | POA: Diagnosis present

## 2019-08-04 DIAGNOSIS — D62 Acute posthemorrhagic anemia: Secondary | ICD-10-CM | POA: Diagnosis not present

## 2019-08-04 DIAGNOSIS — I255 Ischemic cardiomyopathy: Secondary | ICD-10-CM | POA: Diagnosis present

## 2019-08-04 DIAGNOSIS — I4811 Longstanding persistent atrial fibrillation: Secondary | ICD-10-CM | POA: Diagnosis not present

## 2019-08-04 DIAGNOSIS — S72012A Unspecified intracapsular fracture of left femur, initial encounter for closed fracture: Secondary | ICD-10-CM | POA: Diagnosis not present

## 2019-08-04 DIAGNOSIS — I5022 Chronic systolic (congestive) heart failure: Secondary | ICD-10-CM | POA: Diagnosis not present

## 2019-08-04 DIAGNOSIS — N179 Acute kidney failure, unspecified: Secondary | ICD-10-CM | POA: Diagnosis not present

## 2019-08-04 DIAGNOSIS — Z96649 Presence of unspecified artificial hip joint: Secondary | ICD-10-CM | POA: Diagnosis present

## 2019-08-04 DIAGNOSIS — Z7901 Long term (current) use of anticoagulants: Secondary | ICD-10-CM | POA: Diagnosis not present

## 2019-08-04 DIAGNOSIS — I071 Rheumatic tricuspid insufficiency: Secondary | ICD-10-CM | POA: Diagnosis not present

## 2019-08-04 DIAGNOSIS — Z96652 Presence of left artificial knee joint: Secondary | ICD-10-CM | POA: Diagnosis not present

## 2019-08-04 DIAGNOSIS — W010XXA Fall on same level from slipping, tripping and stumbling without subsequent striking against object, initial encounter: Secondary | ICD-10-CM | POA: Diagnosis present

## 2019-08-04 LAB — CBC
HCT: 38.2 % (ref 36.0–46.0)
Hemoglobin: 12.5 g/dL (ref 12.0–15.0)
MCH: 30 pg (ref 26.0–34.0)
MCHC: 32.7 g/dL (ref 30.0–36.0)
MCV: 91.6 fL (ref 80.0–100.0)
Platelets: 197 10*3/uL (ref 150–400)
RBC: 4.17 MIL/uL (ref 3.87–5.11)
RDW: 14.2 % (ref 11.5–15.5)
WBC: 14.5 10*3/uL — ABNORMAL HIGH (ref 4.0–10.5)
nRBC: 0 % (ref 0.0–0.2)

## 2019-08-04 LAB — BASIC METABOLIC PANEL
Anion gap: 10 (ref 5–15)
BUN: 10 mg/dL (ref 8–23)
CO2: 25 mmol/L (ref 22–32)
Calcium: 9 mg/dL (ref 8.9–10.3)
Chloride: 102 mmol/L (ref 98–111)
Creatinine, Ser: 0.72 mg/dL (ref 0.44–1.00)
GFR calc Af Amer: >60 mL/min (ref >60)
GFR calc non Af Amer: >60 mL/min (ref >60)
Glucose, Bld: 118 mg/dL — ABNORMAL HIGH (ref 70–99)
Potassium: 4.4 mmol/L (ref 3.5–5.1)
Sodium: 137 mmol/L (ref 135–145)

## 2019-08-04 LAB — MRSA PCR SCREENING: MRSA by PCR: NEGATIVE

## 2019-08-04 LAB — TYPE AND SCREEN
ABO/RH(D): O NEG
Antibody Screen: NEGATIVE

## 2019-08-04 MED ORDER — CALCIUM CARBONATE ANTACID 500 MG PO CHEW: 1.0000 | CHEWABLE_TABLET | Freq: Three times a day (TID) | ORAL | Status: DC | PRN

## 2019-08-04 MED ORDER — CHLORHEXIDINE GLUCONATE 4 % EX LIQD
60.0000 mL | Freq: Once | CUTANEOUS | Status: AC
  Administered 2019-08-05: 4 via TOPICAL

## 2019-08-04 MED ORDER — ONDANSETRON HCL 4 MG/2ML IJ SOLN
4.0000 mg | Freq: Once | INTRAMUSCULAR | Status: AC
  Administered 2019-08-04: 4 mg via INTRAVENOUS
  Filled 2019-08-04: qty 2

## 2019-08-04 MED ORDER — POLYVINYL ALCOHOL 1.4 % OP SOLN
1.0000 [drp] | OPHTHALMIC | Status: DC | PRN
  Filled 2019-08-04: qty 15

## 2019-08-04 MED ORDER — CEPHALEXIN 250 MG PO CAPS
250.0000 mg | ORAL_CAPSULE | Freq: Two times a day (BID) | ORAL | Status: AC
  Administered 2019-08-04 – 2019-08-07 (×6): 250 mg via ORAL
  Filled 2019-08-04 (×9): qty 1

## 2019-08-04 MED ORDER — ONDANSETRON HCL 4 MG/2ML IJ SOLN
4.0000 mg | Freq: Four times a day (QID) | INTRAMUSCULAR | Status: DC | PRN
  Administered 2019-08-04 – 2019-08-05 (×2): 4 mg via INTRAVENOUS
  Filled 2019-08-04 (×2): qty 2

## 2019-08-04 MED ORDER — OXYCODONE HCL 5 MG PO TABS
5.0000 mg | ORAL_TABLET | Freq: Four times a day (QID) | ORAL | Status: DC | PRN
  Administered 2019-08-04 – 2019-08-05 (×2): 10 mg via ORAL
  Filled 2019-08-04 (×2): qty 2

## 2019-08-04 MED ORDER — SERTRALINE HCL 100 MG PO TABS
100.0000 mg | ORAL_TABLET | Freq: Every day | ORAL | Status: DC
  Administered 2019-08-04 – 2019-08-12 (×8): 100 mg via ORAL
  Filled 2019-08-04 (×9): qty 1

## 2019-08-04 MED ORDER — MORPHINE SULFATE (PF) 2 MG/ML IV SOLN
1.0000 mg | INTRAVENOUS | Status: DC | PRN
  Administered 2019-08-05 – 2019-08-10 (×2): 1 mg via INTRAVENOUS
  Filled 2019-08-04 (×2): qty 1

## 2019-08-04 MED ORDER — METOPROLOL TARTRATE 25 MG PO TABS
25.0000 mg | ORAL_TABLET | Freq: Two times a day (BID) | ORAL | Status: DC
  Administered 2019-08-04 – 2019-08-05 (×3): 25 mg via ORAL
  Filled 2019-08-04 (×3): qty 1

## 2019-08-04 MED ORDER — ENSURE PRE-SURGERY PO LIQD
296.0000 mL | Freq: Once | ORAL | Status: AC
  Administered 2019-08-05: 296 mL via ORAL
  Filled 2019-08-04: qty 296

## 2019-08-04 MED ORDER — SENNA 8.6 MG PO TABS
1.0000 | ORAL_TABLET | Freq: Two times a day (BID) | ORAL | Status: DC
  Administered 2019-08-04 – 2019-08-12 (×10): 8.6 mg via ORAL
  Filled 2019-08-04 (×13): qty 1

## 2019-08-04 MED ORDER — HYDROCODONE-ACETAMINOPHEN 5-325 MG PO TABS
1.0000 | ORAL_TABLET | Freq: Four times a day (QID) | ORAL | Status: DC | PRN
  Administered 2019-08-04 (×2): 2 via ORAL
  Filled 2019-08-04 (×2): qty 2

## 2019-08-04 MED ORDER — DIGOXIN 125 MCG PO TABS
0.1250 mg | ORAL_TABLET | Freq: Every day | ORAL | Status: DC
  Administered 2019-08-04 – 2019-08-12 (×9): 0.125 mg via ORAL
  Filled 2019-08-04 (×9): qty 1

## 2019-08-04 MED ORDER — AMIODARONE HCL 200 MG PO TABS
200.0000 mg | ORAL_TABLET | Freq: Every day | ORAL | Status: DC
  Administered 2019-08-04 – 2019-08-12 (×8): 200 mg via ORAL
  Filled 2019-08-04 (×9): qty 1

## 2019-08-04 MED ORDER — TRANEXAMIC ACID-NACL 1000-0.7 MG/100ML-% IV SOLN
1000.0000 mg | INTRAVENOUS | Status: AC
  Administered 2019-08-05: 1000 mg via INTRAVENOUS
  Filled 2019-08-04 (×2): qty 100

## 2019-08-04 MED ORDER — CEFAZOLIN SODIUM-DEXTROSE 2-4 GM/100ML-% IV SOLN
2.0000 g | INTRAVENOUS | Status: AC
  Administered 2019-08-05: 17:00:00 2 g via INTRAVENOUS
  Filled 2019-08-04 (×2): qty 100

## 2019-08-04 MED ORDER — CARBOXYMETHYLCELLUL-GLYCERIN 0.5-0.9 % OP SOLN: 1.0000 [drp] | OPHTHALMIC | Status: DC | PRN

## 2019-08-04 MED ORDER — POVIDONE-IODINE 10 % EX SWAB: 2.0000 "application " | Freq: Once | CUTANEOUS | Status: DC

## 2019-08-04 MED ORDER — ACETAMINOPHEN 500 MG PO TABS
1000.0000 mg | ORAL_TABLET | Freq: Three times a day (TID) | ORAL | Status: AC
  Administered 2019-08-04 – 2019-08-06 (×6): 1000 mg via ORAL
  Filled 2019-08-04 (×8): qty 2

## 2019-08-04 NOTE — Plan of Care (Signed)
  Problem: Education: Goal: Verbalization of understanding the information provided (i.e., activity precautions, restrictions, etc) will improve Outcome: Progressing Goal: Individualized Educational Video(s) Outcome: Progressing   Problem: Activity: Goal: Ability to ambulate and perform ADLs will improve Outcome: Progressing   

## 2019-08-04 NOTE — Plan of Care (Signed)
  Problem: Education: Goal: Verbalization of understanding the information provided (i.e., activity precautions, restrictions, etc) will improve Outcome: Progressing   

## 2019-08-04 NOTE — Plan of Care (Signed)
  Problem: Education: Goal: Verbalization of understanding the information provided (i.e., activity precautions, restrictions, etc) will improve Outcome: Progressing Goal: Individualized Educational Video(s) Outcome: Progressing   

## 2019-08-04 NOTE — Progress Notes (Signed)
Orthopaedic Trauma Service   Attempted to see patient in consult  Pt will need Left hip hemiarthroplasty to address L femoral neck fracture   Requesting eval by Dr. Jerl Santos  Will reach out to him to see how he will like to proceed  Npo after MN    Mearl Latin, PA-C [phone] (C) 08/04/2019, 12:53 PM  [facility] [address] [place] [address] [phone] Collier Bullock (F)

## 2019-08-04 NOTE — Progress Notes (Addendum)
Patient admitted after midnight, please see H&P by Dr. Cyndia Bent.   A/P:  Left femoral neck fracture Hold Eliquis (llast dose 4/24 at 7pm) and ortho to evaluate in the morning  We will schedule Tylenol for pain control and add as needed [place] for breakthrough pain -We will also need bowel regimen while on pain meds  Atrial fibrillation with left bundle branch block Rates up to 120 at times but non-sustained  Continue amiodarone 200mg , digoxin Pt recently taking off of metoprolol by cardiology due to increase fatigue and dizziness. However if symptoms do not improve, there are plans to start Coreg.  Hold Eliquis due to fracture and plan for surgery  Combined CHF  euvolemic on exam  Last echo in 2019 with EF of 20-25% -Patient states she is able to do household walking without chest discomfort -After her last knee replacement patient had a complicated cardiac course including A. fib with RVR and diminished EF.  We will ask cardiology to follow along as her beta-blocker has been stopped.  Recent UTI Antibiotics started on 4/22 Continue Keflex for total of 7 days  Depression Continue sertraline  IBS Follows with GI outpatient Previously on long term predisone but has been tapered off recently  Husband at bedside and patient is appropriately engaged.  Says she only hurts when she has to move patient states that orthopedics told her she most likely would have surgery tomorrow afternoon.  5/22 DO

## 2019-08-04 NOTE — Progress Notes (Signed)
Patient ID: [name], female   DOB: Jul 28, 1930, 86 y.o.   MRN: [id number]  I have reviewed the patient's chart and x-rays.  I will plan on hemiarthroplasty through an anterior approach tomorrow at around 3:30.  I will meet the patient either later tonight or tomorrow morning.if there are any questions concerning her care or surgery please feel free to call my cell phone number. [phone] Jodi Geralds

## 2019-08-04 NOTE — Consult Note (Signed)
Cardiology Consultation:   Patient ID: [name] MRN: [id number]; DOB: Apr 30, 1930  Admit date: 08/03/2019 Date of Consult: 08/04/2019  Primary Care Provider: [name], MD Primary Cardiologist: [name], MD  Primary Electrophysiologist:  None    Patient Profile:   [name] is a 86 y.o. female with a hx of atrial fibrillation and chronic systolic heart failure who is being seen today for preoperative risk stratification at the request of Dr. Benjamine Mola.  History of Present Illness:   [name] is an 86 year old female with chronic systolic heart failure last EF 20 to 25%, atrial fibrillation with left bundle branch block who presented with left femoral neck fracture.  This occurred when she was leaning to give her great granddaughter a hug.  Her left knee gave out and she fell on her side on hardwood floor.  No loss of consciousness/syncope.  Recent UTI 3 days ago on Keflex.  She currently takes Eliquis which is currently being held.  Recently metoprolol was discontinued in the outpatient setting secondary to increased fatigue and dizziness.  She has been on amiodarone 200 mg a day (replaced the metoprolol) as well as digoxin.  Overall she is laying flat and appears comfortable.  Her husband is at bedside.  She denies any chest pain, no shortness of breath, no orthopnea.  Fairly active.   Past Medical History:  Diagnosis Date  . Acute combined systolic and diastolic heart failure (HCC) 05/02/2017  . Anxiety   . Arthritis    "knees" (06/18/2015)  . Atrial fibrillation (HCC)    h/o sig bleed on coumadin  . Atrial fibrillation with RVR (HCC) 06/18/2015  . Cardiogenic shock (HCC) 05/02/2017  . CHF (congestive heart failure) (HCC)   . Complication of anesthesia 2010   "w/cardiac cath; got into a psychotic state for ~ 3 days; got me out w/Valium"  . Depression    "I have it off and on; not as often as I've gotten older" (06/18/2015)  . Diverticulosis    descending and  sigmoid colon--Dr. Jarold Motto  . DJD (degenerative joint disease) of knee    left  . Dysrhythmia   . GERD (gastroesophageal reflux disease)   . Insomnia     off chronic ambien 5mg  as of 10/11, rare/episodic use since then  DCM/CHF  . Lymphocytic colitis 09/27/2016  . Nonischemic cardiomyopathy (HCC)    normal coronaries, EF 15-20% 04/2008, EF 50-55% 2015  . Osteoporosis    on DXA 05/2010  . Sciatica    Right    Past Surgical History:  Procedure Laterality Date  . BIOPSY  11/22/2017   Procedure: BIOPSY;  Surgeon: 11/24/2017, MD;  Location: [facility] ENDOSCOPY;  Service: Endoscopy;;  . CARDIAC CATHETERIZATION  2010  . CARDIOVERSION N/A 06/19/2015   Procedure: CARDIOVERSION;  Surgeon: 08/19/2015, MD;  Location: [facility] ENDOSCOPY;  Service: Cardiovascular;  Laterality: N/A;  . CATARACT EXTRACTION W/ INTRAOCULAR LENS  IMPLANT, BILATERAL Bilateral   . COLONOSCOPY WITH PROPOFOL N/A 11/22/2017   Procedure: COLONOSCOPY WITH PROPOFOL;  Surgeon: 11/24/2017, MD;  Location: [facility] ENDOSCOPY;  Service: Endoscopy;  Laterality: N/A;  . DILATION AND CURETTAGE OF UTERUS    . TEE WITHOUT CARDIOVERSION N/A 06/19/2015   Procedure: TRANSESOPHAGEAL ECHOCARDIOGRAM (TEE);  Surgeon: 08/19/2015, MD;  Location: [facility] ENDOSCOPY;  Service: Cardiovascular;  Laterality: N/A;  . TOTAL KNEE ARTHROPLASTY Left 04/25/2017   Procedure: TOTAL KNEE ARTHROPLASTY;  Surgeon: 04/27/2017, MD;  Location: [facility] OR;  Service: Orthopedics;  Laterality: Left;  [name] VAGINAL HYSTERECTOMY  1979   ovaries intact     Home Medications:  Prior to Admission medications   Medication Sig Start Date End Date Taking? Authorizing Provider  acetaminophen (TYLENOL) 500 MG tablet Take 500 mg by mouth 2 (two) times daily as needed for moderate pain or headache.     [provider]  amiodarone (PACERONE) 200 MG tablet Take 1 tablet (200 mg total) by mouth daily. 07/17/19   [name], MD  calcium carbonate (TUMS - DOSED IN MG ELEMENTAL  CALCIUM) 500 MG chewable tablet Chew 1 tablet by mouth 3 (three) times daily as needed for indigestion or heartburn.     [provider]  Carboxymethylcellul-Glycerin ([facility] LUBRICATING/DRY EYE OP) Place 1-2 drops into both eyes daily as needed (for dry eyes).    [provider]  cephALEXin (KEFLEX) 250 MG capsule Take 1 capsule (250 mg total) by mouth 2 (two) times daily. 08/01/19   [name], MD  Cholecalciferol (VITAMIN D) 50 MCG (2000 UT) CAPS Take 1 capsule (2,000 Units total) by mouth daily. 03/31/19   [name], MD  digoxin (LANOXIN) 0.125 MG tablet Take 1 tablet (0.125 mg total) by mouth daily. 02/27/19   [name], MD  ELIQUIS 2.5 MG TABS tablet TAKE 1 TABLET BY MOUTH TWICE A DAY 06/25/19   [name], MD  loperamide (IMODIUM) 2 MG capsule Take 2 mg by mouth as needed for diarrhea or loose stools.    [provider]  NON FORMULARY Hemp Salve topically    [provider]  potassium chloride SA (KLOR-CON) 20 MEQ tablet Take 20 mEq by mouth daily.    [provider]  predniSONE (DELTASONE) 5 MG tablet Take 1 tablet( 5mg ) by mouth every other day for 5 days. 07/29/19   07/31/19, MD  Probiotic Product (PROBIOTIC-10 PO) Take by mouth daily.    [provider]  sertraline (ZOLOFT) 50 MG tablet Take 2 tablets (100 mg total) by mouth daily. 08/01/19   08/03/19, MD  traMADol (ULTRAM) 50 MG tablet Take 50 mg by mouth every 6 (six) hours as needed.    [provider]    Inpatient Medications: Scheduled Meds: . acetaminophen  1,000 mg Oral TID  . amiodarone  200 mg Oral Daily  . cephALEXin  250 mg Oral BID  . digoxin  0.125 mg Oral Daily  . senna  1 tablet Oral BID  . sertraline  100 mg Oral Daily   Continuous Infusions:  PRN Meds: calcium carbonate, morphine injection, ondansetron (ZOFRAN) IV, oxyCODONE, polyvinyl alcohol  Allergies:    Allergies  Allergen Reactions  . Ace Inhibitors  Cough  . Warfarin Sodium Other (See Comments)    DOSE RELATED PHARMACOLOGIC EFFECT "bleed out"  . Famotidine     GI upset  . Codeine Other (See Comments)    sedation  . Delsym [Dextromethorphan Polistirex Er] Other (See Comments)    dizziness  . Lactose Intolerance (Gi) Diarrhea  . Phenylephrine Palpitations and Other (See Comments)    Nasal spray- "likely increase in nasal congestion".   . Sulfamethoxazole-Trimethoprim Nausea And Vomiting    GI intolerance.    Social History:   Social History   Socioeconomic History  . Marital status: Married    Spouse name: Not on file  . Number of children: 2  . Years of education: Not on file  . Highest education level: Not on file  Occupational History  .  Occupation: Retired    Associate Professor: RETIRED  Tobacco Use  . Smoking status: Never Smoker  . Smokeless tobacco: Never Used  Substance and Sexual Activity  . Alcohol use: No    Alcohol/week: 0.0 standard drinks  . Drug use: No  . Sexual activity: Not Currently  Other Topics Concern  . Not on file  Social History Narrative   Retired, former [employer]   Married, 1953   Lives with husband   2 grown children, one in [place], one out of state   Tobacco Use - No.    Drug Use - no   teaches Sunday school, reading   Social Determinants of Health   Financial Resource Strain:   . Difficulty of Paying Living Expenses:   Food Insecurity:   . Worried About Programme researcher, broadcasting/film/video in the Last Year:   . Barista in the Last Year:   Transportation Needs:   . Freight forwarder (Medical):   [name] Lack of Transportation (Non-Medical):   Physical Activity:   . Days of Exercise per Week:   . Minutes of Exercise per Session:   Stress:   . Feeling of Stress :   Social Connections:   . Frequency of Communication with Friends and Family:   . Frequency of Social Gatherings with Friends and Family:   . Attends Religious Services:   . Active Member of Clubs or Organizations:   .  Attends Banker Meetings:   [name] Marital Status:   Intimate Partner Violence:   . Fear of Current or Ex-Partner:   . Emotionally Abused:   [name] Physically Abused:   . Sexually Abused:     Family History:    Family History  Problem Relation Age of Onset  . Colon cancer Father        possible colon cancer  . Other Father        esophagus tumor?  . Tuberculosis Mother   . Breast cancer Neg Hx   . Stomach cancer Neg Hx   . Pancreatic cancer Neg Hx      ROS:  Please see the history of present illness.  Denies any fevers chills nausea vomiting syncope All other ROS reviewed and negative.     Physical Exam/Data:   Vitals:   08/04/19 0245 08/04/19 0300 08/04/19 0401 08/04/19 1121  BP: 135/66 131/76 135/79 (!) 115/58  Pulse: (!) 118 (!) 111 92 78  Resp: 15 17 18 20   Temp:   97.8 F (36.6 C) 98.1 F (36.7 C)  TempSrc:   Oral   SpO2: 94% 97% 95% 98%  Weight:      Height:        Intake/Output Summary (Last 24 hours) at 08/04/2019 1428 Last data filed at 08/04/2019 1100 Gross per 24 hour  Intake 0 ml  Output --  Net 0 ml   Last 3 Weights 08/03/2019 08/01/2019 07/29/2019  Weight (lbs) 160 lb 162 lb 2 oz 161 lb 6.4 oz  Weight (kg) 72.576 kg 73.539 kg 73.211 kg     Body mass index is 26.63 kg/m.  General:  Well nourished, well developed, in no acute distress, laying flat in bed, elderly, husband at bedside HEENT: normal Lymph: no adenopathy Neck: no JVD Endocrine:  No thryomegaly Vascular: No carotid bruits Cardiac:  normal S1, S2; irregularly irregular, mildly tachycardic; no murmur  Lungs:  clear to auscultation bilaterally, no wheezing, rhonchi or rales  Abd: soft, nontender, no hepatomegaly  Ext: no  significant edema Musculoskeletal: Leg is straightened  Skin: warm and dry  Neuro:  CNs 2-12 intact, no focal abnormalities noted Psych:  Normal affect   EKG:  The EKG was personally reviewed and demonstrates: Atrial fibrillation heart rate 112 bpm with left  bundle branch block, chronic-no significant change from prior other than increased heart rate  Telemetry:  Telemetry was personally reviewed and demonstrates: Atrial fibrillation with heart rates slightly above 100 but overall for the most part less than 120  Relevant CV Studies:  Echocardiogram 08/03/2017: - Left ventricle: The cavity size was normal. Wall thickness was  increased in a pattern of mild LVH. Systolic function was  severely reduced. The estimated ejection fraction was in the  range of 20% to 25%. Diffuse hypokinesis. There is dyskinesis of  the anteroseptal myocardium. The study is not technically  sufficient to allow evaluation of LV diastolic function.  - Aortic root: The aortic root was normal in size.  - Mitral valve: Calcified annulus.  - Left atrium: The atrium was moderately dilated.  - Pulmonary arteries: Systolic pressure was mildly to moderately  increased.   Impressions:   - Diffuse hypokinesis with septal dyskinesis; overall severely  reduced LV systolic function; mild LVH; moderate LAE; mild TR;  mild to moderate pulmonary hypertension.   Laboratory Data:  High Sensitivity Troponin:  No results for input(s): TROPONINIHS in the last 720 hours.   Chemistry Recent Labs  Lab 08/03/19 2220 08/04/19 0805  NA 140 137  K 4.3 4.4  CL 101 102  CO2 29 25  GLUCOSE 113* 118*  BUN 13 10  CREATININE 0.81 0.72  CALCIUM 9.6 9.0  GFRNONAA >60 >60  GFRAA >60 >60  ANIONGAP 10 10    No results for input(s): PROT, ALBUMIN, AST, ALT, ALKPHOS, BILITOT in the last 168 hours. Hematology Recent Labs  Lab 08/03/19 2220 08/04/19 0805  WBC 12.4* 14.5*  RBC 4.74 4.17  HGB 14.1 12.5  HCT 44.0 38.2  MCV 92.8 91.6  MCH 29.7 30.0  MCHC 32.0 32.7  RDW 13.9 14.2  PLT 237 197   BNPNo results for input(s): BNP, PROBNP in the last 168 hours.  DDimer No results for input(s): DDIMER in the last 168 hours.   Radiology/Studies:  DG Chest Portable 1  View  Result Date: 08/03/2019 CLINICAL DATA:  Trauma EXAM: PORTABLE CHEST 1 VIEW COMPARISON:  April 28, 2017 FINDINGS: There is mild cardiomegaly. Aortic knob calcifications. Mildly increased interstitial markings seen throughout both lungs. No large airspace consolidation or pleural effusion. No acute osseous abnormality. IMPRESSION: Findings suggestive of interstitial edema. Electronically Signed   By: Prudencio Pair M.D.   On: 08/03/2019 23:59   DG Knee Left Port  Result Date: 08/04/2019 CLINICAL DATA:  Initial evaluation for acute pain. EXAM: PORTABLE LEFT KNEE - 1-2 VIEW COMPARISON:  Prior radiograph from 11/25/2009. FINDINGS: A cemented left total knee arthroplasty in place. No periprosthetic lucency to suggest loosening or failure. No acute fracture dislocation. No joint effusion. Underlying osteopenia. No soft tissue abnormality. Scattered vascular calcifications noted about the knee. IMPRESSION: 1. No acute osseous abnormality about the left knee. 2. Left total knee arthroplasty in place without complication. 3. Osteopenia. Electronically Signed   By: Jeannine Boga M.D.   On: 08/04/2019 02:22   DG Hip Unilat With Pelvis 2-3 Views Left  Result Date: 08/03/2019 CLINICAL DATA:  86 year old female with fall and left hip deformity. EXAM: DG HIP (WITH OR WITHOUT PELVIS) 2-3V LEFT COMPARISON:  Left knee radiograph  dated 08/03/2019. FINDINGS: There is a fracture of the left femoral neck. Evaluation however is limited due to advanced osteopenia. CT may provide better evaluation. There is no dislocation. The soft tissues are unremarkable. IMPRESSION: Fracture of the left femoral neck. Electronically Signed   By: Elgie Collard M.D.   On: 08/03/2019 23:25   DG Femur Portable 1 View Left  Result Date: 08/03/2019 CLINICAL DATA:  Larey Seat EXAM: LEFT FEMUR PORTABLE 1 VIEW COMPARISON:  None. FINDINGS: Two frontal views of the left femur are obtained. Findings are suspicious for a left femoral neck  fracture with impaction, though portable technique and body habitus severely limits the evaluation. Dedicated views of the left hip and pelvis are recommended. The distal aspect of the left femur is excluded by collimation. Femoral component of a left knee arthroplasty is partially visualized. IMPRESSION: 1. Findings suspicious for subcapital left femoral neck fracture, though images are nondiagnostic due to body habitus and technique. Dedicated views of the pelvis and left hip are recommended. Electronically Signed   By: Sharlet Salina M.D.   On: 08/03/2019 22:51         Assessment and Plan:   Preoperative cardiac risk stratification for upcoming orthopedic procedure for left femoral head fracture -Her ejection fraction is 20 to 25% from echocardiogram in 2019.  In 2017 she had a Lexiscan Myoview that showed no evidence of ischemia.  In 2019 she underwent left knee replacement which was complicated by atrial fibrillation with rapid ventricular response. -She does not have any evidence clinically of overt heart failure or volume overload.  Her atrial fibrillation currently is reasonably rate controlled when her pain is minimal.  This was not precipitated by a syncopal event. -She may proceed with surgery with moderate to high cardiac risk based mainly upon reduced ejection fraction, age.  We may see her atrial fibrillation heart rate increase.  Because of this, I will restart low-dose metoprolol tartrate 25 mg twice a day.  Remember, metoprolol was recently stopped in the clinic because of ongoing fatigue and dizziness. -Continue with amiodarone and digoxin.  Permanent atrial fibrillation -Has had periods of rapid ventricular response.  Overall seems to be fairly well controlled on both amiodarone and digoxin.  Prior digoxin level was normal.  If necessary, could utilize short acting metoprolol for further rate control.  Chronic systolic heart failure -EF 20 to 25%.  Seems euvolemic.  Be careful  with judicious fluid management which could lead to volume overload.  Chronic anticoagulation -Eliquis is currently on hold.      For questions or updates, please contact [facility] Please consult [email or web address] for contact info under     Signed, Donato Schultz, MD  08/04/2019 2:28 PM

## 2019-08-04 NOTE — Progress Notes (Signed)
Patient of Dr. Jerl Santos with acute left femoral neck fx. Dr. Jerl Santos unavailable this week, but patient and family consent to care by Dr. Luiz Blare. Planning left hip hemiarthroplasty tomorrow.  Neil Crouch, MD [place] [phone]

## 2019-08-04 NOTE — H&P (Signed)
History and Physical    [name] BTD:[id number] DOB: October 12, 1930 DOA: 08/03/2019  PCP: [name], MD  Patient coming from: Home  I have personally briefly reviewed patient's old medical records in [facility]  Chief Complaint: Fall   HPI: [name] is a 86 y.o. female with medical history significant for Hx of NICM with combined CHF with EF of 20-25% in 2019, Atrial fibrillation on Eliquis, IBS, depression, and left knee replacement who presents s/p witnessed fall.   She was leaning in to give her great-granddaughter a hug tonight when her left knee gave out and she fell on her left side onto hardwood floor. Denies any loss of consciousness. Did not hit her head. No dizziness or lightheadedness. No chest pain or shortness of breath.   Of note, She was recently diagnosed with UTI on 4/22 and started on Keflex by PCP.   In the ED, Temp of 99, tachycardic around 110s in atrial fibrillation and normotensive on room air. WBC of 12.4, hemoglobin of 14.1. Unremarkable BMP. X-ray showed left femoral neck fracture.  Orthopedic was consulted by ED physician and recommended holding anticoagulation overnight with plans to for eventual operation.   Last Eliquis dose was 7pm on 4/24  She received Tylenol, Flexeril, fentanyl 50 mcg and Zofran while in the ED.    Review of Systems: Constitutional: No Weight Change, No Fever ENT/Mouth: No sore throat, No Rhinorrhea Eyes: No Eye Pain, No Vision Changes Cardiovascular: No Chest Pain, no SOB, no palpitations Respiratory: No Cough, No Sputum Gastrointestinal: No Nausea, No Vomiting, No Diarrhea, No Constipation, No Pain Genitourinary: no Urinary Incontinence Musculoskeletal: No Arthralgias, No Myalgias Skin: No Skin Lesions, No Pruritus, Neuro: no Weakness, No Numbness,  No Loss of Consciousness, No Syncope Psych: No Anxiety/Panic, No Depression, no decrease appetite Heme/Lymph: No Bruising, No Bleeding  Past Medical  History:  Diagnosis Date  . Acute combined systolic and diastolic heart failure (HCC) 05/02/2017  . Anxiety   . Arthritis    "knees" (06/18/2015)  . Atrial fibrillation (HCC)    h/o sig bleed on coumadin  . Atrial fibrillation with RVR (HCC) 06/18/2015  . Cardiogenic shock (HCC) 05/02/2017  . CHF (congestive heart failure) (HCC)   . Complication of anesthesia 2010   "w/cardiac cath; got into a psychotic state for ~ 3 days; got me out w/Valium"  . Depression    "I have it off and on; not as often as I've gotten older" (06/18/2015)  . Diverticulosis    descending and sigmoid colon--Dr. Jarold Motto  . DJD (degenerative joint disease) of knee    left  . Dysrhythmia   . GERD (gastroesophageal reflux disease)   . Insomnia     off chronic ambien 5mg  as of 10/11, rare/episodic use since then  DCM/CHF  . Lymphocytic colitis 09/27/2016  . Nonischemic cardiomyopathy (HCC)    normal coronaries, EF 15-20% 04/2008, EF 50-55% 2015  . Osteoporosis    on DXA 05/2010  . Sciatica    Right    Past Surgical History:  Procedure Laterality Date  . BIOPSY  11/22/2017   Procedure: BIOPSY;  Surgeon: 11/24/2017, MD;  Location: [facility] ENDOSCOPY;  Service: Endoscopy;;  . CARDIAC CATHETERIZATION  2010  . CARDIOVERSION N/A 06/19/2015   Procedure: CARDIOVERSION;  Surgeon: 08/19/2015, MD;  Location: [facility] ENDOSCOPY;  Service: Cardiovascular;  Laterality: N/A;  . CATARACT EXTRACTION W/ INTRAOCULAR LENS  IMPLANT, BILATERAL Bilateral   . COLONOSCOPY WITH PROPOFOL N/A 11/22/2017  Procedure: COLONOSCOPY WITH PROPOFOL;  Surgeon: Gatha Mayer, MD;  Location: [facility] ENDOSCOPY;  Service: Endoscopy;  Laterality: N/A;  . DILATION AND CURETTAGE OF UTERUS    . TEE WITHOUT CARDIOVERSION N/A 06/19/2015   Procedure: TRANSESOPHAGEAL ECHOCARDIOGRAM (TEE);  Surgeon: Larey Dresser, MD;  Location: [place];  Service: Cardiovascular;  Laterality: N/A;  . TOTAL KNEE ARTHROPLASTY Left 04/25/2017   Procedure: TOTAL KNEE ARTHROPLASTY;   Surgeon: Melrose Nakayama, MD;  Location: [place];  Service: Orthopedics;  Laterality: Left;  [name] VAGINAL HYSTERECTOMY  1979   ovaries intact     reports that she has never smoked. She has never used smokeless tobacco. She reports that she does not drink alcohol or use drugs.  Allergies  Allergen Reactions  . Ace Inhibitors Cough  . Warfarin Sodium Other (See Comments)    DOSE RELATED PHARMACOLOGIC EFFECT "bleed out"  . Famotidine     GI upset  . Codeine Other (See Comments)    sedation  . Delsym [Dextromethorphan Polistirex Er] Other (See Comments)    dizziness  . Lactose Intolerance (Gi) Diarrhea  . Phenylephrine Palpitations and Other (See Comments)    Nasal spray- "likely increase in nasal congestion".   . Sulfamethoxazole-Trimethoprim Nausea And Vomiting    GI intolerance.    Family History  Problem Relation Age of Onset  . Colon cancer Father        possible colon cancer  . Other Father        esophagus tumor?  . Tuberculosis Mother   . Breast cancer Neg Hx   . Stomach cancer Neg Hx   . Pancreatic cancer Neg Hx      Prior to Admission medications   Medication Sig Start Date End Date Taking? Authorizing Provider  acetaminophen (TYLENOL) 500 MG tablet Take 500 mg by mouth 2 (two) times daily as needed for moderate pain or headache.     [provider]  amiodarone (PACERONE) 200 MG tablet Take 1 tablet (200 mg total) by mouth daily. 07/17/19   [name], MD  calcium carbonate (TUMS - DOSED IN MG ELEMENTAL CALCIUM) 500 MG chewable tablet Chew 1 tablet by mouth 3 (three) times daily as needed for indigestion or heartburn.     [provider]  Carboxymethylcellul-Glycerin ([employer] LUBRICATING/DRY EYE OP) Place 1-2 drops into both eyes daily as needed (for dry eyes).    [provider]  cephALEXin (KEFLEX) 250 MG capsule Take 1 capsule (250 mg total) by mouth 2 (two) times daily. 08/01/19   [name], MD  Cholecalciferol (VITAMIN D) 50 MCG  (2000 UT) CAPS Take 1 capsule (2,000 Units total) by mouth daily. 03/31/19   [name], MD  digoxin (LANOXIN) 0.125 MG tablet Take 1 tablet (0.125 mg total) by mouth daily. 02/27/19   [name], MD  ELIQUIS 2.5 MG TABS tablet TAKE 1 TABLET BY MOUTH TWICE A DAY 06/25/19   [name], MD  loperamide (IMODIUM) 2 MG capsule Take 2 mg by mouth as needed for diarrhea or loose stools.    [provider]  NON FORMULARY Hemp Salve topically    [provider]  potassium chloride SA (KLOR-CON) 20 MEQ tablet Take 20 mEq by mouth daily.    [provider]  predniSONE (DELTASONE) 5 MG tablet Take 1 tablet( 5mg ) by mouth every other day for 5 days. 07/29/19   Gatha Mayer, MD  Probiotic Product (PROBIOTIC-10 PO) Take by mouth daily.    [provider]  sertraline (ZOLOFT) 50 MG tablet Take 2 tablets (100 mg total) by mouth daily. 08/01/19   [name], MD  traMADol (ULTRAM) 50 MG tablet Take 50 mg by mouth every 6 (six) hours as needed.    [provider]    Physical Exam: Vitals:   08/03/19 2212 08/03/19 2240 08/03/19 2244 08/03/19 2246  BP: 130/84   (!) 128/104  Pulse: (!) 107 (!) 132  (!) 113  Resp: (!) 23 (!) 27  13  Temp: 99 F (37.2 C)   99 F (37.2 C)  TempSrc: Oral     SpO2: 95% 90%  92%  Weight:   72.6 kg   Height:   5\' 5"  (1.651 m)     Constitutional: elderly female laying at 30 degree incline in bed in mild discomfort Vitals:   08/03/19 2212 08/03/19 2240 08/03/19 2244 08/03/19 2246  BP: 130/84   (!) 128/104  Pulse: (!) 107 (!) 132  (!) 113  Resp: (!) 23 (!) 27  13  Temp: 99 F (37.2 C)   99 F (37.2 C)  TempSrc: Oral     SpO2: 95% 90%  92%  Weight:   72.6 kg   Height:   5\' 5"  (1.651 m)    Eyes: PERRL, lids and conjunctivae normal ENMT: Mucous membranes are moist.  Neck: normal, supple Respiratory: clear to auscultation bilaterally, no wheezing, no crackles. Normal respiratory effort on room air. No  accessory muscle use.  Cardiovascular: Regular rate and rhythm, no murmurs / rubs / gallops. No extremity edema. 2+ pedal pulses. No carotid bruits.  Abdomen: no tenderness, no masses palpated.  Bowel sounds positive.  Musculoskeletal: no clubbing / cyanosis. No joint deformity upper and lower extremities. Left LE is shortened and externally rotated. Normal muscle tone.  Skin: no rashes, lesions, ulcers. No induration Neurologic: CN 2-12 grossly intact. Sensation intact. Moves lower extremity minimally due to intolerance to hip pain Psychiatric: Normal judgment and insight. Alert and oriented x 3. Normal mood.     Labs on Admission: I have personally reviewed following labs and imaging studies  CBC: Recent Labs  Lab 08/03/19 2220  WBC 12.4*  HGB 14.1  HCT 44.0  MCV 92.8  PLT 237   Basic Metabolic Panel: Recent Labs  Lab 08/03/19 2220  NA 140  K 4.3  CL 101  CO2 29  GLUCOSE 113*  BUN 13  CREATININE 0.81  CALCIUM 9.6   GFR: Estimated Creatinine Clearance: 49.7 mL/min (by C-G formula based on SCr of 0.81 mg/dL). Liver Function Tests: No results for input(s): AST, ALT, ALKPHOS, BILITOT, PROT, ALBUMIN in the last 168 hours. No results for input(s): LIPASE, AMYLASE in the last 168 hours. No results for input(s): AMMONIA in the last 168 hours. Coagulation Profile: Recent Labs  Lab 08/03/19 2220  INR 1.1   Cardiac Enzymes: No results for input(s): CKTOTAL, CKMB, CKMBINDEX, TROPONINI in the last 168 hours. BNP (last 3 results) No results for input(s): PROBNP in the last 8760 hours. HbA1C: No results for input(s): HGBA1C in the last 72 hours. CBG: No results for input(s): GLUCAP in the last 168 hours. Lipid Profile: No results for input(s): CHOL, HDL, LDLCALC, TRIG, CHOLHDL, LDLDIRECT in the last 72 hours. Thyroid Function Tests: No results for input(s): TSH, T4TOTAL, FREET4, T3FREE, THYROIDAB in the last 72 hours. Anemia Panel: No results for input(s): VITAMINB12,  FOLATE, FERRITIN, TIBC, IRON, RETICCTPCT in the last 72 hours. Urine analysis:    Component Value Date/Time  COLORURINE YELLOW 04/19/2017 1027   APPEARANCEUR CLEAR 04/19/2017 1027   LABSPEC 1.006 04/19/2017 1027   PHURINE 6.0 04/19/2017 1027   GLUCOSEU NEGATIVE 04/19/2017 1027   HGBUR NEGATIVE 04/19/2017 1027   HGBUR moderate 06/01/2010 1102   BILIRUBINUR Neg 08/01/2019 0949   KETONESUR NEGATIVE 04/19/2017 1027   PROTEINUR Negative 08/01/2019 0949   PROTEINUR NEGATIVE 04/19/2017 1027   UROBILINOGEN 0.2 08/01/2019 0949   UROBILINOGEN 0.2 06/01/2010 1102   NITRITE Positive 08/01/2019 0949   NITRITE NEGATIVE 04/19/2017 1027   LEUKOCYTESUR Small (1+) (A) 08/01/2019 0949    Radiological Exams on Admission: DG Chest Portable 1 View  Result Date: 08/03/2019 CLINICAL DATA:  Trauma EXAM: PORTABLE CHEST 1 VIEW COMPARISON:  April 28, 2017 FINDINGS: There is mild cardiomegaly. Aortic knob calcifications. Mildly increased interstitial markings seen throughout both lungs. No large airspace consolidation or pleural effusion. No acute osseous abnormality. IMPRESSION: Findings suggestive of interstitial edema. Electronically Signed   By: Jonna Clark M.D.   On: 08/03/2019 23:59   DG Hip Unilat With Pelvis 2-3 Views Left  Result Date: 08/03/2019 CLINICAL DATA:  86 year old female with fall and left hip deformity. EXAM: DG HIP (WITH OR WITHOUT PELVIS) 2-3V LEFT COMPARISON:  Left knee radiograph dated 08/03/2019. FINDINGS: There is a fracture of the left femoral neck. Evaluation however is limited due to advanced osteopenia. CT may provide better evaluation. There is no dislocation. The soft tissues are unremarkable. IMPRESSION: Fracture of the left femoral neck. Electronically Signed   By: Elgie Collard M.D.   On: 08/03/2019 23:25   DG Femur Portable 1 View Left  Result Date: 08/03/2019 CLINICAL DATA:  Larey Seat EXAM: LEFT FEMUR PORTABLE 1 VIEW COMPARISON:  None. FINDINGS: Two frontal views of the  left femur are obtained. Findings are suspicious for a left femoral neck fracture with impaction, though portable technique and body habitus severely limits the evaluation. Dedicated views of the left hip and pelvis are recommended. The distal aspect of the left femur is excluded by collimation. Femoral component of a left knee arthroplasty is partially visualized. IMPRESSION: 1. Findings suspicious for subcapital left femoral neck fracture, though images are nondiagnostic due to body habitus and technique. Dedicated views of the pelvis and left hip are recommended. Electronically Signed   By: Sharlet Salina M.D.   On: 08/03/2019 22:51    EKG: Independently reviewed.   Assessment/Plan  Left femoral neck fracture Hold Eliquis (llast dose 4/24 at 7pm) and ortho to evaluate in the morning  PRN hydrocodone-APAP for mild pain and morphine for severe pain   Atrial fibrillation Rates up to 120 at times but non-sustained  Continue amiodarone 200mg , digoxin Pt recently taking off of metoprolol by cardiology due to increase fatigue and dizziness. However if symptoms do not improve, there are plans to start Coreg.  Hold Eliquis due to fracture   Combined CHF  euvolemic on exam  Last echo in 2019 with EF of 20-25%   Recent UTI Antibiotics started on 4/22 Continue Keflex for total of 7 days  Depression Continue sertraline  IBS Follows with GI outpatient Previously on long term predisone but has been tapered off recently  DVT prophylaxis: SCDs Code Status: DNR Family Communication: Plan discussed with patient and husband at bedside  disposition Plan: Home with at least 2 midnight stays  Consults called: Ortho Admission status: inpatient  Status is: Inpatient  Remains inpatient appropriate because:Ongoing active pain requiring inpatient pain management   Dispo: The patient is from: Home  Anticipated d/c is to: rehab              Anticipated d/c date is: > 3 days               Patient currently is not medically stable to d/c.          Anselm Jungling DO [facility]   If 7PM-7AM, please contact night-coverage [email or web address]   08/04/2019, 1:11 AM

## 2019-08-05 ENCOUNTER — Inpatient Hospital Stay (HOSPITAL_COMMUNITY): Payer: Medicare Other

## 2019-08-05 ENCOUNTER — Encounter (HOSPITAL_COMMUNITY): Payer: Self-pay | Admitting: Family Medicine

## 2019-08-05 ENCOUNTER — Inpatient Hospital Stay (HOSPITAL_COMMUNITY): Payer: Medicare Other | Admitting: Critical Care Medicine

## 2019-08-05 ENCOUNTER — Encounter (HOSPITAL_COMMUNITY)
Admission: EM | Disposition: A | Discharge status: Home Health | Payer: Self-pay | Source: Home / Self Care | Attending: Family Medicine

## 2019-08-05 DIAGNOSIS — N179 Acute kidney failure, unspecified: Secondary | ICD-10-CM | POA: Diagnosis not present

## 2019-08-05 DIAGNOSIS — I361 Nonrheumatic tricuspid (valve) insufficiency: Secondary | ICD-10-CM

## 2019-08-05 DIAGNOSIS — I071 Rheumatic tricuspid insufficiency: Secondary | ICD-10-CM

## 2019-08-05 DIAGNOSIS — I4811 Longstanding persistent atrial fibrillation: Secondary | ICD-10-CM

## 2019-08-05 HISTORY — PX: ANTERIOR APPROACH HEMI HIP ARTHROPLASTY: SHX6690

## 2019-08-05 LAB — CBC
HCT: 41.5 % (ref 36.0–46.0)
Hemoglobin: 13.2 g/dL (ref 12.0–15.0)
MCH: 29.7 pg (ref 26.0–34.0)
MCHC: 31.8 g/dL (ref 30.0–36.0)
MCV: 93.5 fL (ref 80.0–100.0)
Platelets: 186 10*3/uL (ref 150–400)
RBC: 4.44 MIL/uL (ref 3.87–5.11)
RDW: 14.3 % (ref 11.5–15.5)
WBC: 13.3 10*3/uL — ABNORMAL HIGH (ref 4.0–10.5)
nRBC: 0 % (ref 0.0–0.2)

## 2019-08-05 LAB — BASIC METABOLIC PANEL
Anion gap: 12 (ref 5–15)
BUN: 15 mg/dL (ref 8–23)
CO2: 24 mmol/L (ref 22–32)
Calcium: 8.9 mg/dL (ref 8.9–10.3)
Chloride: 97 mmol/L — ABNORMAL LOW (ref 98–111)
Creatinine, Ser: 1.25 mg/dL — ABNORMAL HIGH (ref 0.44–1.00)
GFR calc Af Amer: 45 mL/min — ABNORMAL LOW (ref >60)
GFR calc non Af Amer: 39 mL/min — ABNORMAL LOW (ref >60)
Glucose, Bld: 216 mg/dL — ABNORMAL HIGH (ref 70–99)
Potassium: 4.7 mmol/L (ref 3.5–5.1)
Sodium: 133 mmol/L — ABNORMAL LOW (ref 135–145)

## 2019-08-05 LAB — GLUCOSE, CAPILLARY
Glucose-Capillary: 126 mg/dL — ABNORMAL HIGH (ref 70–99)
Glucose-Capillary: 173 mg/dL — ABNORMAL HIGH (ref 70–99)

## 2019-08-05 LAB — ECHOCARDIOGRAM COMPLETE
Height: 65 in
Weight: 2560 oz

## 2019-08-05 SURGERY — HEMIARTHROPLASTY, HIP, DIRECT ANTERIOR APPROACH, FOR FRACTURE
Anesthesia: General | Laterality: Left

## 2019-08-05 MED ORDER — APIXABAN 2.5 MG PO TABS
2.5000 mg | ORAL_TABLET | Freq: Two times a day (BID) | ORAL | Status: DC
  Administered 2019-08-06 – 2019-08-12 (×13): 2.5 mg via ORAL
  Filled 2019-08-05 (×13): qty 1

## 2019-08-05 MED ORDER — LACTATED RINGERS IV SOLN: INTRAVENOUS | Status: DC | PRN

## 2019-08-05 MED ORDER — FENTANYL CITRATE (PF) 250 MCG/5ML IJ SOLN
INTRAMUSCULAR | Status: DC | PRN
  Administered 2019-08-05 (×4): 25 ug via INTRAVENOUS
  Administered 2019-08-05: 50 ug via INTRAVENOUS

## 2019-08-05 MED ORDER — INSULIN ASPART 100 UNIT/ML ~~LOC~~ SOLN
0.0000 [IU] | Freq: Three times a day (TID) | SUBCUTANEOUS | Status: DC
  Administered 2019-08-06 – 2019-08-11 (×3): 2 [IU] via SUBCUTANEOUS

## 2019-08-05 MED ORDER — ALBUMIN HUMAN 5 % IV SOLN: INTRAVENOUS | Status: DC | PRN

## 2019-08-05 MED ORDER — ONDANSETRON HCL 4 MG/2ML IJ SOLN: 4.0000 mg | Freq: Four times a day (QID) | INTRAMUSCULAR | Status: DC | PRN

## 2019-08-05 MED ORDER — FENTANYL CITRATE (PF) 250 MCG/5ML IJ SOLN
INTRAMUSCULAR | Status: AC
  Filled 2019-08-05: qty 5

## 2019-08-05 MED ORDER — PHENYLEPHRINE HCL-NACL 10-0.9 MG/250ML-% IV SOLN
INTRAVENOUS | Status: DC | PRN
Start: 2019-08-05 — End: 2019-08-05
  Administered 2019-08-05: 20 ug/min via INTRAVENOUS

## 2019-08-05 MED ORDER — FENTANYL CITRATE (PF) 100 MCG/2ML IJ SOLN: 25.0000 ug | INTRAMUSCULAR | Status: DC | PRN

## 2019-08-05 MED ORDER — CEFAZOLIN SODIUM-DEXTROSE 2-4 GM/100ML-% IV SOLN
2.0000 g | Freq: Four times a day (QID) | INTRAVENOUS | Status: AC
  Administered 2019-08-05 – 2019-08-06 (×2): 2 g via INTRAVENOUS
  Filled 2019-08-05 (×2): qty 100

## 2019-08-05 MED ORDER — 0.9 % SODIUM CHLORIDE (POUR BTL) OPTIME
TOPICAL | Status: DC | PRN
  Administered 2019-08-05: 1000 mL

## 2019-08-05 MED ORDER — METHOCARBAMOL 500 MG PO TABS
500.0000 mg | ORAL_TABLET | Freq: Four times a day (QID) | ORAL | Status: DC | PRN
  Administered 2019-08-06: 500 mg via ORAL
  Filled 2019-08-05: qty 1

## 2019-08-05 MED ORDER — BUPIVACAINE LIPOSOME 1.3 % IJ SUSP
20.0000 mL | INTRAMUSCULAR | Status: DC
  Filled 2019-08-05: qty 20

## 2019-08-05 MED ORDER — CHLORHEXIDINE GLUCONATE CLOTH 2 % EX PADS
6.0000 | MEDICATED_PAD | Freq: Every day | CUTANEOUS | Status: DC
  Administered 2019-08-05 – 2019-08-10 (×5): 6 via TOPICAL

## 2019-08-05 MED ORDER — ROCURONIUM BROMIDE 10 MG/ML (PF) SYRINGE
PREFILLED_SYRINGE | INTRAVENOUS | Status: AC
  Filled 2019-08-05: qty 10

## 2019-08-05 MED ORDER — ROCURONIUM BROMIDE 10 MG/ML (PF) SYRINGE
PREFILLED_SYRINGE | INTRAVENOUS | Status: DC | PRN
  Administered 2019-08-05: 20 mg via INTRAVENOUS
  Administered 2019-08-05: 50 mg via INTRAVENOUS

## 2019-08-05 MED ORDER — ACETAMINOPHEN 325 MG PO TABS
325.0000 mg | ORAL_TABLET | Freq: Four times a day (QID) | ORAL | Status: DC | PRN
  Administered 2019-08-07 – 2019-08-12 (×11): 650 mg via ORAL
  Filled 2019-08-05 (×15): qty 2

## 2019-08-05 MED ORDER — BUPIVACAINE HCL 0.5 % IJ SOLN
INTRAMUSCULAR | Status: DC | PRN
  Administered 2019-08-05: 20 mL

## 2019-08-05 MED ORDER — INSULIN ASPART 100 UNIT/ML ~~LOC~~ SOLN: 0.0000 [IU] | Freq: Every day | SUBCUTANEOUS | Status: DC

## 2019-08-05 MED ORDER — TRANEXAMIC ACID 1000 MG/10ML IV SOLN
2000.0000 mg | Freq: Once | INTRAVENOUS | Status: DC
  Filled 2019-08-05: qty 20

## 2019-08-05 MED ORDER — ACETAMINOPHEN 10 MG/ML IV SOLN
INTRAVENOUS | Status: DC | PRN
  Administered 2019-08-05: 1000 mg via INTRAVENOUS

## 2019-08-05 MED ORDER — BISACODYL 5 MG PO TBEC: 5.0000 mg | DELAYED_RELEASE_TABLET | Freq: Every day | ORAL | Status: DC | PRN

## 2019-08-05 MED ORDER — ONDANSETRON HCL 4 MG/2ML IJ SOLN: 4.0000 mg | Freq: Once | INTRAMUSCULAR | Status: DC | PRN

## 2019-08-05 MED ORDER — TRANEXAMIC ACID-NACL 1000-0.7 MG/100ML-% IV SOLN
1000.0000 mg | Freq: Once | INTRAVENOUS | Status: AC
  Administered 2019-08-05: 1000 mg via INTRAVENOUS
  Filled 2019-08-05: qty 100

## 2019-08-05 MED ORDER — ONDANSETRON HCL 4 MG PO TABS: 4.0000 mg | ORAL_TABLET | Freq: Four times a day (QID) | ORAL | Status: DC | PRN

## 2019-08-05 MED ORDER — ONDANSETRON HCL 4 MG/2ML IJ SOLN
INTRAMUSCULAR | Status: DC | PRN
  Administered 2019-08-05: 4 mg via INTRAVENOUS

## 2019-08-05 MED ORDER — BUPIVACAINE HCL (PF) 0.5 % IJ SOLN
INTRAMUSCULAR | Status: AC
  Filled 2019-08-05: qty 30

## 2019-08-05 MED ORDER — ACETAMINOPHEN 10 MG/ML IV SOLN
INTRAVENOUS | Status: AC
  Filled 2019-08-05: qty 100

## 2019-08-05 MED ORDER — SUGAMMADEX SODIUM 200 MG/2ML IV SOLN
INTRAVENOUS | Status: DC | PRN
  Administered 2019-08-05: 200 mg via INTRAVENOUS

## 2019-08-05 MED ORDER — ETOMIDATE 2 MG/ML IV SOLN
INTRAVENOUS | Status: DC | PRN
  Administered 2019-08-05: 8 mg via INTRAVENOUS

## 2019-08-05 MED ORDER — LIDOCAINE 2% (20 MG/ML) 5 ML SYRINGE
INTRAMUSCULAR | Status: AC
  Filled 2019-08-05: qty 10

## 2019-08-05 MED ORDER — MAGNESIUM CITRATE PO SOLN: 1.0000 | Freq: Once | ORAL | Status: DC | PRN

## 2019-08-05 MED ORDER — TRAMADOL HCL 50 MG PO TABS
50.0000 mg | ORAL_TABLET | Freq: Four times a day (QID) | ORAL | Status: DC | PRN
  Administered 2019-08-06 (×2): 50 mg via ORAL
  Filled 2019-08-05 (×2): qty 1

## 2019-08-05 MED ORDER — METHOCARBAMOL 1000 MG/10ML IJ SOLN
500.0000 mg | Freq: Four times a day (QID) | INTRAVENOUS | Status: DC | PRN
  Filled 2019-08-05: qty 5

## 2019-08-05 MED ORDER — ALUM & MAG HYDROXIDE-SIMETH 200-200-20 MG/5ML PO SUSP: 30.0000 mL | ORAL | Status: DC | PRN

## 2019-08-05 MED ORDER — SODIUM CHLORIDE 0.45 % IV SOLN: INTRAVENOUS | Status: DC

## 2019-08-05 MED ORDER — LACTATED RINGERS IV SOLN: INTRAVENOUS | Status: DC

## 2019-08-05 MED ORDER — ETOMIDATE 2 MG/ML IV SOLN
INTRAVENOUS | Status: AC
  Filled 2019-08-05: qty 10

## 2019-08-05 MED ORDER — PROPOFOL 10 MG/ML IV BOLUS
INTRAVENOUS | Status: AC
  Filled 2019-08-05: qty 20

## 2019-08-05 MED ORDER — BUPIVACAINE LIPOSOME 1.3 % IJ SUSP
INTRAMUSCULAR | Status: DC | PRN
  Administered 2019-08-05: 20 mL

## 2019-08-05 MED ORDER — DEXAMETHASONE SODIUM PHOSPHATE 10 MG/ML IJ SOLN
INTRAMUSCULAR | Status: DC | PRN
  Administered 2019-08-05: 4 mg via INTRAVENOUS

## 2019-08-05 SURGICAL SUPPLY — 51 items
BALL HIP ARTICU 28 +5 (Hips) ×1 IMPLANT
BENZOIN TINCTURE PRP APPL 2/3 (GAUZE/BANDAGES/DRESSINGS) ×2 IMPLANT
BIPOLAR PROS AML 46 (Hips) ×2 IMPLANT
CELLS DAT CNTRL 66122 CELL SVR (MISCELLANEOUS) IMPLANT
CLSR STERI-STRIP ANTIMIC 1/2X4 (GAUZE/BANDAGES/DRESSINGS) ×2 IMPLANT
COVER PERINEAL POST (MISCELLANEOUS) ×2 IMPLANT
COVER SURGICAL LIGHT HANDLE (MISCELLANEOUS) ×2 IMPLANT
DRAPE C-ARM 42X72 X-RAY (DRAPES) ×2 IMPLANT
DRAPE STERI IOBAN 125X83 (DRAPES) ×2 IMPLANT
DRAPE U-SHAPE 47X51 STRL (DRAPES) ×4 IMPLANT
DRSG AQUACEL AG ADV 3.5X 6 (GAUZE/BANDAGES/DRESSINGS) ×2 IMPLANT
DURAPREP 26ML APPLICATOR (WOUND CARE) ×2 IMPLANT
ELECT BLADE 4.0 EZ CLEAN MEGAD (MISCELLANEOUS)
ELECT CAUTERY BLADE 6.4 (BLADE) ×2 IMPLANT
ELECT REM PT RETURN 9FT ADLT (ELECTROSURGICAL) ×2
ELECTRODE BLDE 4.0 EZ CLN MEGD (MISCELLANEOUS) IMPLANT
ELECTRODE REM PT RTRN 9FT ADLT (ELECTROSURGICAL) ×1 IMPLANT
GLOVE BIOGEL PI IND STRL 8 (GLOVE) ×2 IMPLANT
GLOVE BIOGEL PI INDICATOR 8 (GLOVE) ×2
GLOVE ECLIPSE 7.5 STRL STRAW (GLOVE) ×4 IMPLANT
GOWN STRL REUS W/ TWL LRG LVL3 (GOWN DISPOSABLE) ×2 IMPLANT
GOWN STRL REUS W/ TWL XL LVL3 (GOWN DISPOSABLE) ×2 IMPLANT
GOWN STRL REUS W/TWL LRG LVL3 (GOWN DISPOSABLE) ×4
GOWN STRL REUS W/TWL XL LVL3 (GOWN DISPOSABLE) ×4
HEAD BIPOLAR PROS AML 46 (Hips) ×1 IMPLANT
HIP BALL ARTICU 28 +5 (Hips) ×2 IMPLANT
HOOD PEEL AWAY FACE SHEILD DIS (HOOD) ×4 IMPLANT
KIT BASIN OR (CUSTOM PROCEDURE TRAY) ×2 IMPLANT
KIT TURNOVER KIT B (KITS) ×2 IMPLANT
MANIFOLD NEPTUNE II (INSTRUMENTS) ×2 IMPLANT
NEEDLE SPNL 22GX3.5 QUINCKE BK (NEEDLE) ×2 IMPLANT
NS IRRIG 1000ML POUR BTL (IV SOLUTION) ×2 IMPLANT
PACK TOTAL JOINT (CUSTOM PROCEDURE TRAY) ×2 IMPLANT
PACK UNIVERSAL I (CUSTOM PROCEDURE TRAY) ×2 IMPLANT
PAD ARMBOARD 7.5X6 YLW CONV (MISCELLANEOUS) ×4 IMPLANT
RTRCTR WOUND ALEXIS 18CM MED (MISCELLANEOUS)
RTRCTR WOUND ALEXIS 18CM SML (INSTRUMENTS) ×2
SAVER CELL AAL HAEMONETICS (INSTRUMENTS) ×1 IMPLANT
SAW OSC TIP CART 19.5X105X1.3 (SAW) ×2 IMPLANT
STEM CORAIL KA12 (Stem) ×2 IMPLANT
SUT ETHIBOND NAB CT1 #1 30IN (SUTURE) ×4 IMPLANT
SUT MNCRL AB 3-0 PS2 18 (SUTURE) IMPLANT
SUT VIC AB 0 CT1 27 (SUTURE) ×4
SUT VIC AB 0 CT1 27XBRD ANBCTR (SUTURE) ×2 IMPLANT
SUT VIC AB 1 CT1 27 (SUTURE) ×4
SUT VIC AB 1 CT1 27XBRD ANBCTR (SUTURE) ×2 IMPLANT
SUT VIC AB 2-0 CT1 27 (SUTURE) ×2
SUT VIC AB 2-0 CT1 TAPERPNT 27 (SUTURE) ×1 IMPLANT
SYR 50ML LL SCALE MARK (SYRINGE) ×2 IMPLANT
TOWEL GREEN STERILE (TOWEL DISPOSABLE) ×2 IMPLANT
TOWEL GREEN STERILE FF (TOWEL DISPOSABLE) ×2 IMPLANT

## 2019-08-05 NOTE — Progress Notes (Signed)
  Echocardiogram 2D Echocardiogram has been performed.  Gerda Diss 08/05/2019, 11:05 AM

## 2019-08-05 NOTE — Plan of Care (Signed)
  Problem: Education: Goal: Verbalization of understanding the information provided (i.e., activity precautions, restrictions, etc) will improve Outcome: Progressing Goal: Individualized Educational Video(s) Outcome: Progressing   Problem: Activity: Goal: Ability to ambulate and perform ADLs will improve Outcome: Progressing   Problem: Clinical Measurements: Goal: Postoperative complications will be avoided or minimized Outcome: Progressing   

## 2019-08-05 NOTE — Anesthesia Preprocedure Evaluation (Addendum)
Anesthesia Evaluation  Patient identified by MRN, date of birth, ID band Patient awake    Reviewed: Allergy & Precautions, NPO status , Patient's Chart, lab work & pertinent test results  Airway Mallampati: II  TM Distance: >3 FB Neck ROM: Full    Dental  (+) Teeth Intact, Dental Advisory Given   Pulmonary neg pulmonary ROS,    breath sounds clear to auscultation       Cardiovascular +CHF  + dysrhythmias (on metoprolol, digoxin, and eliquis. Last dose of eliquis was Monday night) Atrial Fibrillation  Rhythm:Irregular Rate:Normal  TTE 07/2017 EF 20-25%. Diffuse hypokinesis. There is dyskinesis of the anteroseptal myocardium.  Had echo today, but not yet read. Per Cardiologist today " preliminary review of echo still shows severely depressed LVEF 25% with global hypokinesis. There is severe TR and there is moderate pulmonary HTN. There is very prominent paradoxical septal motion/diastolic flattening consistent with severe RV volume overload."  PAF- rate controlled   Neuro/Psych PSYCHIATRIC DISORDERS Anxiety Depression negative neurological ROS     GI/Hepatic GERD  Medicated,  Endo/Other  negative endocrine ROS  Renal/GU Renal InsufficiencyRenal diseaseAKI, Cr 1.3  negative genitourinary   Musculoskeletal  (+) Arthritis , Osteoarthritis,  Left femoral neck fx   Abdominal Normal abdominal exam  (+)   Peds  Hematology negative hematology ROS (+)   Anesthesia Other Findings   Reproductive/Obstetrics negative OB ROS                           Anesthesia Physical  Anesthesia Plan  ASA: III  Anesthesia Plan: General   Post-op Pain Management:    Induction: Intravenous  PONV Risk Score and Plan: 3 and Ondansetron, Dexamethasone and Diphenhydramine  Airway Management Planned: Oral ETT  Additional Equipment: Arterial line  Intra-op Plan:   Post-operative Plan: Extubation in OR  Informed  Consent: I have reviewed the patients History and Physical, chart, labs and discussed the procedure including the risks, benefits and alternatives for the proposed anesthesia with the patient or authorized representative who has indicated his/her understanding and acceptance.   Patient has DNR.  Discussed DNR with patient and Suspend DNR.   Dental advisory given  Plan Discussed with: CRNA and Anesthesiologist  Anesthesia Plan Comments: (Has not been off eliquis for long enough to do neuraxial safely. Will proceed with GA with pre-induction arterial line for close hemodynamic monitoring throughout. Cardiology to follow along with patient in perioperative period. )      Anesthesia Quick Evaluation

## 2019-08-05 NOTE — Anesthesia Postprocedure Evaluation (Signed)
Anesthesia Post Note  Patient: Kendrea Cerritos Mansour  Procedure(s) Performed: ANTERIOR APPROACH HEMI HIP ARTHROPLASTY (Left )     Patient location during evaluation: PACU Anesthesia Type: General Level of consciousness: awake and alert and patient cooperative Pain management: pain level controlled Vital Signs Assessment: post-procedure vital signs reviewed and stable Respiratory status: spontaneous breathing, nonlabored ventilation, respiratory function stable and patient connected to nasal cannula oxygen Cardiovascular status: blood pressure returned to baseline and stable Postop Assessment: no apparent nausea or vomiting Anesthetic complications: no    Last Vitals:  Vitals:   08/05/19 2000 08/05/19 2031  BP: (!) 145/71 126/79  Pulse: 72 75  Resp: 16 16  Temp: 36.6 C 36.5 C  SpO2: 98% 99%    Last Pain:  Vitals:   08/05/19 2031  TempSrc: Oral  PainSc:                  Erling Cruz. Remedios Mckone

## 2019-08-05 NOTE — Anesthesia Procedure Notes (Signed)
Procedure Name: Intubation Date/Time: 08/05/2019 4:34 PM Performed by: Adria Dill, CRNA Pre-anesthesia Checklist: Patient identified, Emergency Drugs available, Suction available and Patient being monitored Patient Re-evaluated:Patient Re-evaluated prior to induction Oxygen Delivery Method: Circle system utilized Preoxygenation: Pre-oxygenation with 100% oxygen Induction Type: IV induction Ventilation: Mask ventilation without difficulty Laryngoscope Size: Miller and 2 Grade View: Grade I Tube type: Oral Tube size: 7.0 mm Number of attempts: 1 Airway Equipment and Method: Stylet and Oral airway Placement Confirmation: ETT inserted through vocal cords under direct vision,  positive ETCO2 and breath sounds checked- equal and bilateral Secured at: 21 cm Tube secured with: Tape Dental Injury: Teeth and Oropharynx as per pre-operative assessment

## 2019-08-05 NOTE — Plan of Care (Signed)
  Problem: Education: Goal: Verbalization of understanding the information provided (i.e., activity precautions, restrictions, etc) will improve Outcome: Progressing   

## 2019-08-05 NOTE — Anesthesia Procedure Notes (Signed)
Arterial Line Insertion Performed by: Adria Dill, CRNA, CRNA  Patient location: Pre-op. Preanesthetic checklist: patient identified, IV checked, site marked, risks and benefits discussed, surgical consent, monitors and equipment checked, pre-op evaluation, timeout performed and anesthesia consent Lidocaine 1% used for infiltration Right, radial was placed Catheter size: 20 G Hand hygiene performed , maximum sterile barriers used  and Seldinger technique used Allen's test indicative of satisfactory collateral circulation Attempts: 3 Procedure performed without using ultrasound guided technique. Following insertion, dressing applied and Biopatch. Post procedure assessment: normal  Patient tolerated the procedure well with no immediate complications.

## 2019-08-05 NOTE — OR Nursing (Signed)
Contacted the significant other on the status of the procedure.

## 2019-08-05 NOTE — Transfer of Care (Signed)
Immediate Anesthesia Transfer of Care Note  Patient: [name]  Procedure(s) Performed: ANTERIOR APPROACH HEMI HIP ARTHROPLASTY (Left )  Patient Location: PACU  Anesthesia Type:General  Level of Consciousness: awake, alert  and oriented  Airway & Oxygen Therapy: Patient Spontanous Breathing and Patient connected to nasal cannula oxygen  Post-op Assessment: Report given to RN and Post -op Vital signs reviewed and stable  Post vital signs: Reviewed and stable  Last Vitals:  Vitals Value Taken Time  BP 141/82 08/05/19 1911  Temp 36.6 C 08/05/19 1855  Pulse 70 08/05/19 1913  Resp 16 08/05/19 1913  SpO2 100 % 08/05/19 1913  Vitals shown include unvalidated device data.  Last Pain:  Vitals:   08/05/19 1855  TempSrc:   PainSc: 0-No pain      Patients Stated Pain Goal: 1 (08/04/19 0411)  Complications: No apparent anesthesia complications

## 2019-08-05 NOTE — Consult Note (Signed)
Reason for Consult:hip fracture Referring Physician: hospitalists/trauma service  [name] is an 86 y.o. female.  HPI: this is an 86 year old female who is well-known to our service.  She's been treated by Dr. Jerl Santos for total knee replacement 2 years ago.  She unfortunately fell to 2 days ago and suffered a left femoral neck fracture.  She was admitted to the orthopedic service and our group was consulted yesterday in the afternoon for possible treatment options.  Dr. Jerl Santos was out of town and I was happy to get involved in her care.  It sounds like after her knee replacement she went into congestive heart failure and so it is important to have a close followup and monitoring by the hospitalist service.  The patient states that she has significant left hip pain and significant pain with any mobility of the left hip.  She denies numbness tingling or radiating pain down the leg.  She states that the knee gave out and that's why she fell.  Past Medical History:  Diagnosis Date  . Acute combined systolic and diastolic heart failure (HCC) 05/02/2017  . Anxiety   . Arthritis    "knees" (06/18/2015)  . Atrial fibrillation (HCC)    h/o sig bleed on coumadin  . Atrial fibrillation with RVR (HCC) 06/18/2015  . Cardiogenic shock (HCC) 05/02/2017  . CHF (congestive heart failure) (HCC)   . Complication of anesthesia 2010   "w/cardiac cath; got into a psychotic state for ~ 3 days; got me out w/Valium"  . Depression    "I have it off and on; not as often as I've gotten older" (06/18/2015)  . Diverticulosis    descending and sigmoid colon--Dr. Jarold Motto  . DJD (degenerative joint disease) of knee    left  . Dysrhythmia   . GERD (gastroesophageal reflux disease)   . Insomnia     off chronic ambien 5mg  as of 10/11, rare/episodic use since then  DCM/CHF  . Lymphocytic colitis 09/27/2016  . Nonischemic cardiomyopathy (HCC)    normal coronaries, EF 15-20% 04/2008, EF 50-55% 2015  . Osteoporosis    on  DXA 05/2010  . Sciatica    Right    Past Surgical History:  Procedure Laterality Date  . BIOPSY  11/22/2017   Procedure: BIOPSY;  Surgeon: Iva Boop, MD;  Location: [facility] ENDOSCOPY;  Service: Endoscopy;;  . CARDIAC CATHETERIZATION  2010  . CARDIOVERSION N/A 06/19/2015   Procedure: CARDIOVERSION;  Surgeon: Laurey Morale, MD;  Location: [facility] ENDOSCOPY;  Service: Cardiovascular;  Laterality: N/A;  . CATARACT EXTRACTION W/ INTRAOCULAR LENS  IMPLANT, BILATERAL Bilateral   . COLONOSCOPY WITH PROPOFOL N/A 11/22/2017   Procedure: COLONOSCOPY WITH PROPOFOL;  Surgeon: Iva Boop, MD;  Location: [facility] ENDOSCOPY;  Service: Endoscopy;  Laterality: N/A;  . DILATION AND CURETTAGE OF UTERUS    . TEE WITHOUT CARDIOVERSION N/A 06/19/2015   Procedure: TRANSESOPHAGEAL ECHOCARDIOGRAM (TEE);  Surgeon: Laurey Morale, MD;  Location: [facility] ENDOSCOPY;  Service: Cardiovascular;  Laterality: N/A;  . TOTAL KNEE ARTHROPLASTY Left 04/25/2017   Procedure: TOTAL KNEE ARTHROPLASTY;  Surgeon: [name], MD;  Location: [facility] OR;  Service: Orthopedics;  Laterality: Left;  [name] VAGINAL HYSTERECTOMY  1979   ovaries intact    Family History  Problem Relation Age of Onset  . Colon cancer Father        possible colon cancer  . Other Father        esophagus tumor?  . Tuberculosis Mother   . Breast cancer  Neg Hx   . Stomach cancer Neg Hx   . Pancreatic cancer Neg Hx     Social History:  reports that she has never smoked. She has never used smokeless tobacco. She reports that she does not drink alcohol or use drugs.  Allergies:  Allergies  Allergen Reactions  . Ace Inhibitors Cough  . Warfarin Sodium Other (See Comments)    DOSE RELATED PHARMACOLOGIC EFFECT "bleed out"  . Famotidine Other (See Comments)    GI upset  . Codeine Other (See Comments)    sedation  . Delsym [Dextromethorphan Polistirex Er] Other (See Comments)    dizziness  . Lactose Intolerance (Gi) Diarrhea  . Phenylephrine Palpitations and Other  (See Comments)    Nasal spray- "likely increase in nasal congestion".   . Sulfamethoxazole-Trimethoprim Nausea And Vomiting    GI intolerance.    Medications: I have reviewed the patient's current medications.  Results for orders placed or performed during the hospital encounter of 08/03/19 (from the past 48 hour(s))  Respiratory Panel by RT PCR (Flu A&B, Covid) - Nasopharyngeal Swab     Status: None   Collection Time: 08/03/19 10:16 PM   Specimen: Nasopharyngeal Swab  Result Value Ref Range   SARS Coronavirus 2 by RT PCR NEGATIVE NEGATIVE    Comment: (NOTE) SARS-CoV-2 target nucleic acids are NOT DETECTED. The SARS-CoV-2 RNA is generally detectable in upper respiratoy specimens during the acute phase of infection. The lowest concentration of SARS-CoV-2 viral copies this assay can detect is 131 copies/mL. A negative result does not preclude SARS-Cov-2 infection and should not be used as the sole basis for treatment or other patient management decisions. A negative result may occur with  improper specimen collection/handling, submission of specimen other than nasopharyngeal swab, presence of viral mutation(s) within the areas targeted by this assay, and inadequate number of viral copies (<131 copies/mL). A negative result must be combined with clinical observations, patient history, and epidemiological information. The expected result is Negative. Fact Sheet for Patients:  PinkCheek.be Fact Sheet for Healthcare Providers:  GravelBags.it This test is not yet ap proved or cleared by the [place] FDA and  has been authorized for detection and/or diagnosis of SARS-CoV-2 by FDA under an Emergency Use Authorization (EUA). This EUA will remain  in effect (meaning this test can be used) for the duration of the COVID-19 declaration under Section 564(b)(1) of the Act, 21 U.S.C. section 360bbb-3(b)(1), unless the authorization is  terminated or revoked sooner.    Influenza A by PCR NEGATIVE NEGATIVE   Influenza B by PCR NEGATIVE NEGATIVE    Comment: (NOTE) The Xpert Xpress SARS-CoV-2/FLU/RSV assay is intended as an aid in  the diagnosis of influenza from Nasopharyngeal swab specimens and  should not be used as a sole basis for treatment. Nasal washings and  aspirates are unacceptable for Xpert Xpress SARS-CoV-2/FLU/RSV  testing. Fact Sheet for Patients: PinkCheek.be Fact Sheet for Healthcare Providers: GravelBags.it This test is not yet approved or cleared by the [place] FDA and  has been authorized for detection and/or diagnosis of SARS-CoV-2 by  FDA under an Emergency Use Authorization (EUA). This EUA will remain  in effect (meaning this test can be used) for the duration of the  Covid-19 declaration under Section 564(b)(1) of the Act, 21  U.S.C. section 360bbb-3(b)(1), unless the authorization is  terminated or revoked. Performed at [facility], [place] [address] [place], [place] [address]   CBC     Status: Abnormal  Collection Time: 08/03/19 10:20 PM  Result Value Ref Range   WBC 12.4 (H) 4.0 - 10.5 K/uL   RBC 4.74 3.87 - 5.11 MIL/uL   Hemoglobin 14.1 12.0 - 15.0 g/dL   HCT 33.0 07.6 - 22.6 %   MCV 92.8 80.0 - 100.0 fL   MCH 29.7 26.0 - 34.0 pg   MCHC 32.0 30.0 - 36.0 g/dL   RDW 33.3 54.5 - 62.5 %   Platelets 237 150 - 400 K/uL   nRBC 0.0 0.0 - 0.2 %    Comment: Performed at [facility], [address], [place], [place] [address]  Basic metabolic panel     Status: Abnormal   Collection Time: 08/03/19 10:20 PM  Result Value Ref Range   Sodium 140 135 - 145 mmol/L   Potassium 4.3 3.5 - 5.1 mmol/L    Comment: SPECIMEN HEMOLYZED. HEMOLYSIS MAY AFFECT INTEGRITY OF RESULTS.   Chloride 101 98 - 111 mmol/L   CO2 29 22 - 32 mmol/L   Glucose, Bld 113 (H) 70 - 99 mg/dL    Comment: Glucose reference range applies only to samples  taken after fasting for at least 8 hours.   BUN 13 8 - 23 mg/dL   Creatinine, Ser 7.34 0.44 - 1.00 mg/dL   Calcium 9.6 8.9 - 28.7 mg/dL   GFR calc non Af Amer >60 >60 mL/min   GFR calc Af Amer >60 >60 mL/min   Anion gap 10 5 - 15    Comment: Performed at [facility], [address], [place], [place] [address]  Protime-INR     Status: None   Collection Time: 08/03/19 10:20 PM  Result Value Ref Range   Prothrombin Time 14.0 11.4 - 15.2 seconds   INR 1.1 0.8 - 1.2    Comment: (NOTE) INR goal varies based on device and disease states. Performed at [facility], [address] [place], [place] [address]   MRSA PCR Screening     Status: None   Collection Time: 08/04/19  4:07 AM   Specimen: Nasal Mucosa; Nasopharyngeal  Result Value Ref Range   MRSA by PCR NEGATIVE NEGATIVE    Comment:        The GeneXpert MRSA Assay (FDA approved for NASAL specimens only), is one component of a comprehensive MRSA colonization surveillance program. It is not intended to diagnose MRSA infection nor to guide or monitor treatment for MRSA infections. Performed at [facility], [address] [place], [place] [address]   CBC     Status: Abnormal   Collection Time: 08/04/19  8:05 AM  Result Value Ref Range   WBC 14.5 (H) 4.0 - 10.5 K/uL   RBC 4.17 3.87 - 5.11 MIL/uL   Hemoglobin 12.5 12.0 - 15.0 g/dL   HCT 41.6 38.4 - 53.6 %   MCV 91.6 80.0 - 100.0 fL   MCH 30.0 26.0 - 34.0 pg   MCHC 32.7 30.0 - 36.0 g/dL   RDW 46.8 03.2 - 12.2 %   Platelets 197 150 - 400 K/uL   nRBC 0.0 0.0 - 0.2 %    Comment: Performed at [facility], [address] [place], [place] [address]  Basic metabolic panel     Status: Abnormal   Collection Time: 08/04/19  8:05 AM  Result Value Ref Range   Sodium 137 135 - 145 mmol/L   Potassium 4.4 3.5 - 5.1 mmol/L   Chloride 102 98 - 111  mmol/L   CO2 25 22 - 32 mmol/L   Glucose, Bld 118 (H) 70 - 99 mg/dL    Comment: Glucose reference range applies  only to samples taken after fasting for at least 8 hours.   BUN 10 8 - 23 mg/dL   Creatinine, Ser 6.27 0.44 - 1.00 mg/dL   Calcium 9.0 8.9 - 03.5 mg/dL   GFR calc non Af Amer >60 >60 mL/min   GFR calc Af Amer >60 >60 mL/min   Anion gap 10 5 - 15    Comment: Performed at [facility], [address], [place], [place] [address]  Type and screen [facility]     Status: None   Collection Time: 08/04/19  5:10 PM  Result Value Ref Range   ABO/RH(D) O NEG    Antibody Screen NEG    Sample Expiration      08/07/2019,2359 Performed at [facility], [address] [place], [place] [address]   Basic metabolic panel     Status: Abnormal   Collection Time: 08/05/19  1:55 AM  Result Value Ref Range   Sodium 133 (L) 135 - 145 mmol/L   Potassium 4.7 3.5 - 5.1 mmol/L   Chloride 97 (L) 98 - 111 mmol/L   CO2 24 22 - 32 mmol/L   Glucose, Bld 216 (H) 70 - 99 mg/dL    Comment: Glucose reference range applies only to samples taken after fasting for at least 8 hours.   BUN 15 8 - 23 mg/dL   Creatinine, Ser 3.71 (H) 0.44 - 1.00 mg/dL   Calcium 8.9 8.9 - 69.6 mg/dL   GFR calc non Af Amer 39 (L) >60 mL/min   GFR calc Af Amer 45 (L) >60 mL/min   Anion gap 12 5 - 15    Comment: Performed at [facility], [address] [place], [place] [address]  CBC     Status: Abnormal   Collection Time: 08/05/19  1:55 AM  Result Value Ref Range   WBC 13.3 (H) 4.0 - 10.5 K/uL   RBC 4.44 3.87 - 5.11 MIL/uL   Hemoglobin 13.2 12.0 - 15.0 g/dL   HCT 10.1 75.1 - 02.5 %   MCV 93.5 80.0 - 100.0 fL   MCH 29.7 26.0 - 34.0 pg   MCHC 31.8 30.0 - 36.0 g/dL   RDW 85.2 77.8 - 24.2 %   Platelets 186 150 - 400 K/uL   nRBC 0.0 0.0 - 0.2 %    Comment: Performed at [facility], [address], [place], [place] [address]    DG Chest Portable 1 View  Result Date: 08/03/2019 CLINICAL DATA:  Trauma EXAM: PORTABLE CHEST 1 VIEW COMPARISON:  April 28, 2017 FINDINGS: There is mild  cardiomegaly. Aortic knob calcifications. Mildly increased interstitial markings seen throughout both lungs. No large airspace consolidation or pleural effusion. No acute osseous abnormality. IMPRESSION: Findings suggestive of interstitial edema. Electronically Signed   By: Jonna Clark M.D.   On: 08/03/2019 23:59   DG Knee Left Port  Result Date: 08/04/2019 CLINICAL DATA:  Initial evaluation for acute pain. EXAM: PORTABLE LEFT KNEE - 1-2 VIEW COMPARISON:  Prior radiograph from 11/25/2009. FINDINGS: A cemented left total knee arthroplasty in place. No periprosthetic lucency to suggest loosening or failure. No acute fracture dislocation. No joint effusion. Underlying osteopenia. No soft tissue abnormality. Scattered vascular calcifications noted about the knee. IMPRESSION: 1. No acute osseous abnormality about the left knee.  2. Left total knee arthroplasty in place without complication. 3. Osteopenia. Electronically Signed   By: Rise Mu M.D.   On: 08/04/2019 02:22   DG Hip Unilat With Pelvis 2-3 Views Left  Result Date: 08/03/2019 CLINICAL DATA:  86 year old female with fall and left hip deformity. EXAM: DG HIP (WITH OR WITHOUT PELVIS) 2-3V LEFT COMPARISON:  Left knee radiograph dated 08/03/2019. FINDINGS: There is a fracture of the left femoral neck. Evaluation however is limited due to advanced osteopenia. CT may provide better evaluation. There is no dislocation. The soft tissues are unremarkable. IMPRESSION: Fracture of the left femoral neck. Electronically Signed   By: Elgie Collard M.D.   On: 08/03/2019 23:25   DG Femur Portable 1 View Left  Result Date: 08/03/2019 CLINICAL DATA:  Larey Seat EXAM: LEFT FEMUR PORTABLE 1 VIEW COMPARISON:  None. FINDINGS: Two frontal views of the left femur are obtained. Findings are suspicious for a left femoral neck fracture with impaction, though portable technique and body habitus severely limits the evaluation. Dedicated views of the left hip and pelvis  are recommended. The distal aspect of the left femur is excluded by collimation. Femoral component of a left knee arthroplasty is partially visualized. IMPRESSION: 1. Findings suspicious for subcapital left femoral neck fracture, though images are nondiagnostic due to body habitus and technique. Dedicated views of the pelvis and left hip are recommended. Electronically Signed   By: Sharlet Salina M.D.   On: 08/03/2019 22:51    ROS  ROS: I have reviewed the patient's review of systems thoroughly and there are no positive responses as relates to the HPI. Blood pressure 132/78, pulse 76, temperature 98.2 F (36.8 C), resp. rate 17, height 5\' 5"  (1.651 m), weight 72.6 kg, SpO2 100 %. Physical Exam Well-developed well-nourished patient in no acute distress. Alert and oriented x3 HEENT:within normal limits Cardiac: Regular rate and rhythm Pulmonary: Lungs clear to auscultation Abdomen: Soft and nontender.  Normal active bowel sounds  Musculoskeletal: left hip: Pain with range of motion.  Slight external rotation and shortening laying in bed.  Neurovascularly intact distally. Assessment/Plan: 86 year old female with significant medical comorbidities admitted to the hospitalist service with a displaced femoral neck fracture left side.//I have had significant communication with the family regarding the risks and benefits of the surgery.  I feel that the patient is best managed by hemiarthroplasty.   I feel that she'll need close monitoring postoperatively by the internal medicine service given her history of congestive heart failure after her previous surgery. I have had a prolonged discussion with the patient regarding the risk and benefits of the surgical procedure.  The patient understands the risks include but are not limited to bleeding, infection and failure of the surgery to cure the problem and need for further surgery.  The patient understands there is a slight risk of death at the time of surgery.   The patient understands these risks along with the potential benefits and wishes to proceed with surgical intervention.    The patient will be followed in the office in the postoperative period.  Patient will need any anticoagulation held this morning.  Delisia Mcquiston 89 08/05/2019, 7:00 AM  [phone]

## 2019-08-05 NOTE — Progress Notes (Addendum)
Progress Note    [name]  KZS:[id number] DOB: 1930/10/13  DOA: 08/03/2019 PCP: [name], MD    Brief Narrative:     Medical records reviewed and are as summarized below:  [name] is an 86 y.o. female past medical history significant for nonischemic cardiomyopathy, CHF with an EF of 20 to 25% 2019, A. fib on Eliquis, irritable bowel syndrome, depression admitted April 25 after a mechanical fall with left hip fracture.  Weighted by orthopedics who have her scheduled for surgery April 26 in the afternoon.  Assessment/Plan:   Principal Problem:   Hip fracture (HCC) Active Problems:   Acute kidney injury (HCC)   Nonischemic cardiomyopathy (HCC)   ATRIAL FIBRILLATION   Depression   Acute lower UTI   IBS (irritable bowel syndrome)   #1.  Hip fracture.  Mechanical fall.  X-ray reveals fracture of the left femoral neck.  Evaluated by orthopedics who plan surgical intervention this afternoon.  Home medications include Eliquis -Hold Eliquis -Scheduled Tylenol for pain control -Regimen -Oxey for breakthrough  #2.  Acute kidney injury.  Creatinine 1.2 this morning.  Likely related to n.p.o. status in the setting of #1. -Hold nephrotoxins -Monitor urine output -No IV fluids just yet as she has a history of ischemic cardiomyopathy with an EF of 20% -Monitor closely  #3.  Atrial fibrillation with a left bundle branch block.  Was having intermittent elevations of her heart rate at admission.  Home medications include amiodarone and dig.  She was evaluated by cardiology for risk stratification who recommended resuming beta-blocker that had recently been discontinued due to increased fatigue and dizziness. -Continue beta-blocker -Continue amiodarone -Continue dig -Holding Eliquis as noted above -Monitor  #4.  Combined CHF.  Does not appear overloaded.  Last echo 2019 with an EF of 20%.  Evaluated by cardiology as noted above.  Cardiology recommends judicious  management of IV fluids -Monitor intake and output -Attain daily weights -Medications as noted above  #5.  Chronic anticoagulation.  Home medications include Eliquis.  This is currently on hold for surgery. -Continue to hold Eliquis -Resume Eliquis when cleared by Ortho  #6.  Recent urinary tract infection.  Antibiotics started April 22.  Patient is afebrile hemodynamically stable and nontoxic-appearing -Continue Keflex through April 28   Family Communication/Anticipated D/C date and plan/Code Status   DVT prophylaxis: scd ordered. Code Status: DNR Family Communication: husband at bedside Disposition Plan: Status is: Inpatient  Remains inpatient appropriate because:Inpatient level of care appropriate due to severity of illness   Dispo: The patient is from: Home              Anticipated d/c is to: SNF              Anticipated d/c date is: 3 days              Patient currently is not medically stable to d/c.          Medical Consultants:   [facility]   Anti-Infectives:   Keflex until 4/28  Subjective:   Awake alert.  Reports good pain management  Objective:    Vitals:   08/04/19 1121 08/04/19 1429 08/04/19 1928 08/05/19 0518  BP: (!) 115/58 110/75 122/72 132/78  Pulse: 78 87 75 76  Resp: 20 17 15 17   Temp: 98.1 F (36.7 C) 98.7 F (37.1 C) 98.4 F (36.9 C) 98.2 F (36.8 C)  TempSrc:  Oral Oral   SpO2:  98% 97% 92% 100%  Weight:      Height:        Intake/Output Summary (Last 24 hours) at 08/05/2019 1053 Last data filed at 08/05/2019 0900 Gross per 24 hour  Intake 120 ml  Output 400 ml  Net -280 ml   Filed Weights   08/03/19 2244  Weight: 72.6 kg    Exam: General: Awake alert well-nourished no acute distress CV: Irregularly irregular no murmur gallop or rub no lower extremity edema Respiratory: No increased work of breathing breath sounds are distant but clear I hear no wheezes no crackles Abdomen: Obese soft positive bowel  sounds throughout no guarding or rebounding Musculoskeletal: Left leg with external rotation a little shorter than the right.  Nontender to palpation but decreased range of motion due to pain Neuro: Alert and oriented x3 speech clear facial symmetry cranial nerves II through XII grossly intact   Data Reviewed:   I have personally reviewed following labs and imaging studies:  Labs: Labs show the following:   Basic Metabolic Panel: Recent Labs  Lab 08/03/19 2220 08/03/19 2220 08/04/19 0805 08/05/19 0155  NA 140  --  137 133*  K 4.3   < > 4.4 4.7  CL 101  --  102 97*  CO2 29  --  25 24  GLUCOSE 113*  --  118* 216*  BUN 13  --  10 15  CREATININE 0.81  --  0.72 1.25*  CALCIUM 9.6  --  9.0 8.9   < > = values in this interval not displayed.   GFR Estimated Creatinine Clearance: 32.2 mL/min (A) (by C-G formula based on SCr of 1.25 mg/dL (H)). Liver Function Tests: No results for input(s): AST, ALT, ALKPHOS, BILITOT, PROT, ALBUMIN in the last 168 hours. No results for input(s): LIPASE, AMYLASE in the last 168 hours. No results for input(s): AMMONIA in the last 168 hours. Coagulation profile Recent Labs  Lab 08/03/19 2220  INR 1.1    CBC: Recent Labs  Lab 08/03/19 2220 08/04/19 0805 08/05/19 0155  WBC 12.4* 14.5* 13.3*  HGB 14.1 12.5 13.2  HCT 44.0 38.2 41.5  MCV 92.8 91.6 93.5  PLT 237 197 186   Cardiac Enzymes: No results for input(s): CKTOTAL, CKMB, CKMBINDEX, TROPONINI in the last 168 hours. BNP (last 3 results) No results for input(s): PROBNP in the last 8760 hours. CBG: No results for input(s): GLUCAP in the last 168 hours. D-Dimer: No results for input(s): DDIMER in the last 72 hours. Hgb A1c: No results for input(s): HGBA1C in the last 72 hours. Lipid Profile: No results for input(s): CHOL, HDL, LDLCALC, TRIG, CHOLHDL, LDLDIRECT in the last 72 hours. Thyroid function studies: No results for input(s): TSH, T4TOTAL, T3FREE, THYROIDAB in the last 72  hours.  Invalid input(s): FREET3 Anemia work up: No results for input(s): VITAMINB12, FOLATE, FERRITIN, TIBC, IRON, RETICCTPCT in the last 72 hours. Sepsis Labs: Recent Labs  Lab 08/03/19 2220 08/04/19 0805 08/05/19 0155  WBC 12.4* 14.5* 13.3*    Microbiology Recent Results (from the past 240 hour(s))  Urine Culture     Status: Abnormal   Collection Time: 08/01/19 10:03 AM   Specimen: Urine  Result Value Ref Range Status   MICRO NUMBER: [id number]  Final   SPECIMEN QUALITY: Adequate  Final   Sample Source NOT GIVEN  Final   STATUS: FINAL  Final   ISOLATE 1: Klebsiella pneumoniae (A)  Final    Comment: Greater than 100,000 CFU/mL of Klebsiella pneumoniae  Susceptibility   Klebsiella pneumoniae - URINE CULTURE, REFLEX    AMOX/CLAVULANIC <=2 Sensitive     AMPICILLIN 16 Resistant     AMPICILLIN/SULBACTAM <=2 Sensitive     CEFAZOLIN* <=4 Not Reportable      * For infections other than uncomplicated UTIcaused by E. coli, K. pneumoniae or P. mirabilis:Cefazolin is resistant if MIC > or = 8 mcg/mL.(Distinguishing susceptible versus intermediatefor isolates with MIC < or = 4 mcg/mL requiresadditional testing.)For uncomplicated UTI caused by E. coli,K. pneumoniae or P. mirabilis: Cefazolin issusceptible if MIC <32 mcg/mL and predictssusceptible to the oral agents cefaclor, cefdinir,cefpodoxime, cefprozil, cefuroxime, cephalexinand loracarbef.    CEFEPIME <=1 Sensitive     CEFTRIAXONE <=1 Sensitive     CIPROFLOXACIN <=0.25 Sensitive     LEVOFLOXACIN <=0.12 Sensitive     ERTAPENEM <=0.5 Sensitive     GENTAMICIN <=1 Sensitive     IMIPENEM <=0.25 Sensitive     NITROFURANTOIN 32 Sensitive     PIP/TAZO <=4 Sensitive     TOBRAMYCIN <=1 Sensitive     TRIMETH/SULFA* <=20 Sensitive      * For infections other than uncomplicated UTIcaused by E. coli, K. pneumoniae or P. mirabilis:Cefazolin is resistant if MIC > or = 8 mcg/mL.(Distinguishing susceptible versus intermediatefor isolates with  MIC < or = 4 mcg/mL requiresadditional testing.)For uncomplicated UTI caused by E. coli,K. pneumoniae or P. mirabilis: Cefazolin issusceptible if MIC <32 mcg/mL and predictssusceptible to the oral agents cefaclor, cefdinir,cefpodoxime, cefprozil, cefuroxime, cephalexinand loracarbef.Legend:S = Susceptible  I = IntermediateR = Resistant  NS = Not susceptible* = Not tested  NR = Not reported**NN = See antimicrobic comments  Respiratory Panel by RT PCR (Flu A&B, Covid) - Nasopharyngeal Swab     Status: None   Collection Time: 08/03/19 10:16 PM   Specimen: Nasopharyngeal Swab  Result Value Ref Range Status   SARS Coronavirus 2 by RT PCR NEGATIVE NEGATIVE Final    Comment: (NOTE) SARS-CoV-2 target nucleic acids are NOT DETECTED. The SARS-CoV-2 RNA is generally detectable in upper respiratoy specimens during the acute phase of infection. The lowest concentration of SARS-CoV-2 viral copies this assay can detect is 131 copies/mL. A negative result does not preclude SARS-Cov-2 infection and should not be used as the sole basis for treatment or other patient management decisions. A negative result may occur with  improper specimen collection/handling, submission of specimen other than nasopharyngeal swab, presence of viral mutation(s) within the areas targeted by this assay, and inadequate number of viral copies (<131 copies/mL). A negative result must be combined with clinical observations, patient history, and epidemiological information. The expected result is Negative. Fact Sheet for Patients:  https://www.moore.com/ Fact Sheet for Healthcare Providers:  https://www.young.biz/ This test is not yet ap proved or cleared by the [place] FDA and  has been authorized for detection and/or diagnosis of SARS-CoV-2 by FDA under an Emergency Use Authorization (EUA). This EUA will remain  in effect (meaning this test can be used) for the duration of the COVID-19  declaration under Section 564(b)(1) of the Act, 21 U.S.C. section 360bbb-3(b)(1), unless the authorization is terminated or revoked sooner.    Influenza A by PCR NEGATIVE NEGATIVE Final   Influenza B by PCR NEGATIVE NEGATIVE Final    Comment: (NOTE) The Xpert Xpress SARS-CoV-2/FLU/RSV assay is intended as an aid in  the diagnosis of influenza from Nasopharyngeal swab specimens and  should not be used as a sole basis for treatment. Nasal washings and  aspirates are unacceptable for Xpert Xpress SARS-CoV-2/FLU/RSV  testing. Fact Sheet for Patients: PinkCheek.be Fact Sheet for Healthcare Providers: GravelBags.it This test is not yet approved or cleared by the [place] FDA and  has been authorized for detection and/or diagnosis of SARS-CoV-2 by  FDA under an Emergency Use Authorization (EUA). This EUA will remain  in effect (meaning this test can be used) for the duration of the  Covid-19 declaration under Section 564(b)(1) of the Act, 21  U.S.C. section 360bbb-3(b)(1), unless the authorization is  terminated or revoked. Performed at [facility], [place] [address], [place], [place] [address]   MRSA PCR Screening     Status: None   Collection Time: 08/04/19  4:07 AM   Specimen: Nasal Mucosa; Nasopharyngeal  Result Value Ref Range Status   MRSA by PCR NEGATIVE NEGATIVE Final    Comment:        The GeneXpert MRSA Assay (FDA approved for NASAL specimens only), is one component of a comprehensive MRSA colonization surveillance program. It is not intended to diagnose MRSA infection nor to guide or monitor treatment for MRSA infections. Performed at [facility], [place] [address], [place],  [address]     Procedures and diagnostic studies:  DG Chest Portable 1 View  Result Date: 08/03/2019 CLINICAL DATA:  Trauma EXAM: PORTABLE CHEST 1 VIEW COMPARISON:  April 28, 2017 FINDINGS: There is mild  cardiomegaly. Aortic knob calcifications. Mildly increased interstitial markings seen throughout both lungs. No large airspace consolidation or pleural effusion. No acute osseous abnormality. IMPRESSION: Findings suggestive of interstitial edema. Electronically Signed   By: Prudencio Pair M.D.   On: 08/03/2019 23:59   DG Knee Left Port  Result Date: 08/04/2019 CLINICAL DATA:  Initial evaluation for acute pain. EXAM: PORTABLE LEFT KNEE - 1-2 VIEW COMPARISON:  Prior radiograph from 11/25/2009. FINDINGS: A cemented left total knee arthroplasty in place. No periprosthetic lucency to suggest loosening or failure. No acute fracture dislocation. No joint effusion. Underlying osteopenia. No soft tissue abnormality. Scattered vascular calcifications noted about the knee. IMPRESSION: 1. No acute osseous abnormality about the left knee. 2. Left total knee arthroplasty in place without complication. 3. Osteopenia. Electronically Signed   By: Jeannine Boga M.D.   On: 08/04/2019 02:22   DG Hip Unilat With Pelvis 2-3 Views Left  Result Date: 08/03/2019 CLINICAL DATA:  86 year old female with fall and left hip deformity. EXAM: DG HIP (WITH OR WITHOUT PELVIS) 2-3V LEFT COMPARISON:  Left knee radiograph dated 08/03/2019. FINDINGS: There is a fracture of the left femoral neck. Evaluation however is limited due to advanced osteopenia. CT may provide better evaluation. There is no dislocation. The soft tissues are unremarkable. IMPRESSION: Fracture of the left femoral neck. Electronically Signed   By: Anner Crete M.D.   On: 08/03/2019 23:25   DG Femur Portable 1 View Left  Result Date: 08/03/2019 CLINICAL DATA:  [place] EXAM: LEFT FEMUR PORTABLE 1 VIEW COMPARISON:  None. FINDINGS: Two frontal views of the left femur are obtained. Findings are suspicious for a left femoral neck fracture with impaction, though portable technique and body habitus severely limits the evaluation. Dedicated views of the left hip and pelvis  are recommended. The distal aspect of the left femur is excluded by collimation. Femoral component of a left knee arthroplasty is partially visualized. IMPRESSION: 1. Findings suspicious for subcapital left femoral neck fracture, though images are nondiagnostic due to body habitus and technique. Dedicated views of the pelvis and left hip are recommended. Electronically Signed   By: Legrand Como  Manson Passey M.D.   On: 08/03/2019 22:51    Medications:    acetaminophen  1,000 mg Oral TID   amiodarone  200 mg Oral Daily   cephALEXin  250 mg Oral BID   digoxin  0.125 mg Oral Daily   insulin aspart  0-15 Units Subcutaneous TID WC   insulin aspart  0-5 Units Subcutaneous QHS   metoprolol tartrate  25 mg Oral BID   povidone-iodine  2 application Topical Once   senna  1 tablet Oral BID   sertraline  100 mg Oral Daily   Continuous Infusions:   ceFAZolin (ANCEF) IV     tranexamic acid       LOS: 1 day   Gwenyth Bender NP  [facility]   How to contact the [facility] Attending or Consulting provider 7A - 7P or covering provider during after hours 7P -7A, for this patient?  Check the care team in [place] and look for a) attending/consulting [facility] provider listed and b) the [facility] team listed Log into [email or web address] and use [place]'s universal password to access. If you do not have the password, please contact the hospital operator. Locate the [facility] provider you are looking for under [facility] and page to a number that you can be directly reached. If you still have difficulty reaching the provider, please page the [facility] (Director on Call) for the Hospitalists listed on amion for assistance.  08/05/2019, 10:53 AM

## 2019-08-05 NOTE — Progress Notes (Addendum)
Evaluation s/p surgery  Received call from orthopedic surgery Dr. Luiz Blare this evening to evaluate patient at bedside. She is s/p left hip hemiarthroplasty after a fall. She has hx of going into Atrial fibrillation with RVR and heart failure following a total knee replacement back in 2019. Pt required Neo-synephrine in OR with this operation and Dr. Luiz Blare would like primary hospitalist team to assess patient following operation.   I evaluated [name] at bedside in the PACU. She was drowsy from anesthesia but awoke easily to voice and able to follow simple commands. She was in atrial fibrillation with rate controlled. Blood pressure from ART line was stable at 140s/70s off of pressor. O2 sat at 97% on 6L with nurse actively weaning at bedside.  At this point, I relay to nurse at bedside that patient is stable to return to a cardiac telemetry bed and to call [facility] Hospitalist team back if there are any acute changes to patient's clinical status.

## 2019-08-05 NOTE — Progress Notes (Addendum)
Progress Note  Patient Name: [name] Date of Encounter: 08/05/2019  Primary Cardiologist: [name], MD   Subjective   Plan for repair of left femoral hip fracture today. Patient is in afib with rates in the 80s. No chest pain or sob. Echo results pending.   Inpatient Medications    Scheduled Meds: . acetaminophen  1,000 mg Oral TID  . amiodarone  200 mg Oral Daily  . cephALEXin  250 mg Oral BID  . digoxin  0.125 mg Oral Daily  . insulin aspart  0-15 Units Subcutaneous TID WC  . insulin aspart  0-5 Units Subcutaneous QHS  . metoprolol tartrate  25 mg Oral BID  . povidone-iodine  2 application Topical Once  . senna  1 tablet Oral BID  . sertraline  100 mg Oral Daily   Continuous Infusions: .  ceFAZolin (ANCEF) IV    . tranexamic acid     PRN Meds: calcium carbonate, morphine injection, ondansetron (ZOFRAN) IV, oxyCODONE, polyvinyl alcohol   Vital Signs    Vitals:   08/04/19 1121 08/04/19 1429 08/04/19 1928 08/05/19 0518  BP: (!) 115/58 110/75 122/72 132/78  Pulse: 78 87 75 76  Resp: 20 17 15 17   Temp: 98.1 F (36.7 C) 98.7 F (37.1 C) 98.4 F (36.9 C) 98.2 F (36.8 C)  TempSrc:  Oral Oral   SpO2: 98% 97% 92% 100%  Weight:      Height:        Intake/Output Summary (Last 24 hours) at 08/05/2019 1138 Last data filed at 08/05/2019 0900 Gross per 24 hour  Intake 120 ml  Output 400 ml  Net -280 ml   Last 3 Weights 08/03/2019 08/01/2019 07/29/2019  Weight (lbs) 160 lb 162 lb 2 oz 161 lb 6.4 oz  Weight (kg) 72.576 kg 73.539 kg 73.211 kg      Telemetry    Afib, HR 80s - Personally Reviewed  ECG    No new - Personally Reviewed  Physical Exam   GEN: No acute distress.   Neck: minimal JVD Cardiac: Irreg Irreg, + murmur, no rubs, or gallops.  Respiratory: Clear to auscultation bilaterally. GI: Soft, nontender, non-distended  MS: No edema; No deformity. Neuro:  Nonfocal  Psych: Normal affect   Labs    High Sensitivity Troponin:  No  results for input(s): TROPONINIHS in the last 720 hours.    Chemistry Recent Labs  Lab 08/03/19 2220 08/04/19 0805 08/05/19 0155  NA 140 137 133*  K 4.3 4.4 4.7  CL 101 102 97*  CO2 29 25 24   GLUCOSE 113* 118* 216*  BUN 13 10 15   CREATININE 0.81 0.72 1.25*  CALCIUM 9.6 9.0 8.9  GFRNONAA >60 >60 39*  GFRAA >60 >60 45*  ANIONGAP 10 10 12      Hematology Recent Labs  Lab 08/03/19 2220 08/04/19 0805 08/05/19 0155  WBC 12.4* 14.5* 13.3*  RBC 4.74 4.17 4.44  HGB 14.1 12.5 13.2  HCT 44.0 38.2 41.5  MCV 92.8 91.6 93.5  MCH 29.7 30.0 29.7  MCHC 32.0 32.7 31.8  RDW 13.9 14.2 14.3  PLT 237 197 186    BNPNo results for input(s): BNP, PROBNP in the last 168 hours.   DDimer No results for input(s): DDIMER in the last 168 hours.   Radiology    DG Chest Portable 1 View  Result Date: 08/03/2019 CLINICAL DATA:  Trauma EXAM: PORTABLE CHEST 1 VIEW COMPARISON:  April 28, 2017 FINDINGS: There is mild cardiomegaly. Aortic knob calcifications.  Mildly increased interstitial markings seen throughout both lungs. No large airspace consolidation or pleural effusion. No acute osseous abnormality. IMPRESSION: Findings suggestive of interstitial edema. Electronically Signed   By: Prudencio Pair M.D.   On: 08/03/2019 23:59   DG Knee Left Port  Result Date: 08/04/2019 CLINICAL DATA:  Initial evaluation for acute pain. EXAM: PORTABLE LEFT KNEE - 1-2 VIEW COMPARISON:  Prior radiograph from 11/25/2009. FINDINGS: A cemented left total knee arthroplasty in place. No periprosthetic lucency to suggest loosening or failure. No acute fracture dislocation. No joint effusion. Underlying osteopenia. No soft tissue abnormality. Scattered vascular calcifications noted about the knee. IMPRESSION: 1. No acute osseous abnormality about the left knee. 2. Left total knee arthroplasty in place without complication. 3. Osteopenia. Electronically Signed   By: Jeannine Boga M.D.   On: 08/04/2019 02:22   DG Hip  Unilat With Pelvis 2-3 Views Left  Result Date: 08/03/2019 CLINICAL DATA:  86 year old female with fall and left hip deformity. EXAM: DG HIP (WITH OR WITHOUT PELVIS) 2-3V LEFT COMPARISON:  Left knee radiograph dated 08/03/2019. FINDINGS: There is a fracture of the left femoral neck. Evaluation however is limited due to advanced osteopenia. CT may provide better evaluation. There is no dislocation. The soft tissues are unremarkable. IMPRESSION: Fracture of the left femoral neck. Electronically Signed   By: Anner Crete M.D.   On: 08/03/2019 23:25   DG Femur Portable 1 View Left  Result Date: 08/03/2019 CLINICAL DATA:  [place] EXAM: LEFT FEMUR PORTABLE 1 VIEW COMPARISON:  None. FINDINGS: Two frontal views of the left femur are obtained. Findings are suspicious for a left femoral neck fracture with impaction, though portable technique and body habitus severely limits the evaluation. Dedicated views of the left hip and pelvis are recommended. The distal aspect of the left femur is excluded by collimation. Femoral component of a left knee arthroplasty is partially visualized. IMPRESSION: 1. Findings suspicious for subcapital left femoral neck fracture, though images are nondiagnostic due to body habitus and technique. Dedicated views of the pelvis and left hip are recommended. Electronically Signed   By: Randa Ngo M.D.   On: 08/03/2019 22:51    Cardiac Studies   Echo ordered  Echo 2019 Study Conclusions   - Left ventricle: The cavity size was normal. Wall thickness was  increased in a pattern of mild LVH. Systolic function was  severely reduced. The estimated ejection fraction was in the  range of 20% to 25%. Diffuse hypokinesis. There is dyskinesis of  the anteroseptal myocardium. The study is not technically  sufficient to allow evaluation of LV diastolic function.  - Aortic root: The aortic root was normal in size.  - Mitral valve: Calcified annulus.  - Left atrium: The atrium  was moderately dilated.  - Pulmonary arteries: Systolic pressure was mildly to moderately  increased.   Patient Profile     86 y.o. female with a hx of atrial fibrillation and chronic systolic heart failure who is being seen today for preoperative risk stratification.  Assessment & Plan    Preoperative cardiac risk stratification for upcoming orthopedic procedure for left femoral head fracture from mechanical fall - Echo in 2019 showed EF 20-25% - Lexiscan in 2017 showed no evidence of ischemia.  - In 2019 she underwent left knee replacement which was complicated by Afib and started on Eliquis. She takes amiodadone and digoxin. Heart rates were al little up and Metoprolol 25mg  BID was started (this was recently stopped due to fatigue) - Patient does  not have chest pain, palpitations, sob, or overt fluid overload - Echo ordered>>results pending - Patient is at moderate to high cardiac risk based on reduced EF.  Permanent Afib - On amiodarone and digoxin - Eliquis held >>Restart after surgery - Dig level 0.7 in 12/2018 - BB added as above for heart rate control  Chronic systolic HF - EF 52-84% - Euvolemic on exam - Cautious with fluids   For questions or updates, please contact [facility] Please consult [email or web address] for contact info under        Signed, Cadence David Stall, PA-C  08/05/2019, 11:38 AM    I have seen and examined the patient along with Cadence David Stall, PA-C .  I have reviewed the chart, notes and new data.  I agree with PA/NP's note.  Key new complaints: no CV complaints Key examination changes: no clinical signs of hypervolemia, but has very prominent "v: waves, irregular rhythm, rate controlled Key new findings / data: preliminary review of echo still shows severely depressed LVEF 25% with global hypokinesis. There is severe TR and there is moderate pulmonary HTN. There is very prominent paradoxical septal motion/diastolic flattening consistent with severe  RV volume overload. Unable to retrieve images of echo from 08/03/2017, when TR was "mild", but echo from 04/30/2017 also shows moderate-severe TR.  PLAN: Avoid additional IV fluids. Will follow along periop. Risk of complications is increased due to severe cardiomyopathy, Afib and age, but the risks are even higher with conservative management and she agrees quality of life would unacceptable without hip replacement.  Thurmon Fair, MD, [facility] [phone] 08/05/2019, 2:07 PM

## 2019-08-06 ENCOUNTER — Encounter: Payer: Self-pay | Admitting: *Deleted

## 2019-08-06 ENCOUNTER — Inpatient Hospital Stay (HOSPITAL_COMMUNITY): Payer: Medicare Other

## 2019-08-06 DIAGNOSIS — R300 Dysuria: Secondary | ICD-10-CM | POA: Insufficient documentation

## 2019-08-06 DIAGNOSIS — S72002A Fracture of unspecified part of neck of left femur, initial encounter for closed fracture: Secondary | ICD-10-CM

## 2019-08-06 LAB — BASIC METABOLIC PANEL
Anion gap: 9 (ref 5–15)
BUN: 19 mg/dL (ref 8–23)
CO2: 26 mmol/L (ref 22–32)
Calcium: 8.7 mg/dL — ABNORMAL LOW (ref 8.9–10.3)
Chloride: 97 mmol/L — ABNORMAL LOW (ref 98–111)
Creatinine, Ser: 1.13 mg/dL — ABNORMAL HIGH (ref 0.44–1.00)
GFR calc Af Amer: 51 mL/min — ABNORMAL LOW (ref >60)
GFR calc non Af Amer: 44 mL/min — ABNORMAL LOW (ref >60)
Glucose, Bld: 151 mg/dL — ABNORMAL HIGH (ref 70–99)
Potassium: 4.6 mmol/L (ref 3.5–5.1)
Sodium: 132 mmol/L — ABNORMAL LOW (ref 135–145)

## 2019-08-06 LAB — CBC
HCT: 34.4 % — ABNORMAL LOW (ref 36.0–46.0)
Hemoglobin: 11.2 g/dL — ABNORMAL LOW (ref 12.0–15.0)
MCH: 30 pg (ref 26.0–34.0)
MCHC: 32.6 g/dL (ref 30.0–36.0)
MCV: 92.2 fL (ref 80.0–100.0)
Platelets: 172 10*3/uL (ref 150–400)
RBC: 3.73 MIL/uL — ABNORMAL LOW (ref 3.87–5.11)
RDW: 13.9 % (ref 11.5–15.5)
WBC: 9.6 10*3/uL (ref 4.0–10.5)
nRBC: 0 % (ref 0.0–0.2)

## 2019-08-06 LAB — GLUCOSE, CAPILLARY
Glucose-Capillary: 128 mg/dL — ABNORMAL HIGH (ref 70–99)
Glucose-Capillary: 118 mg/dL — ABNORMAL HIGH (ref 70–99)
Glucose-Capillary: 114 mg/dL — ABNORMAL HIGH (ref 70–99)
Glucose-Capillary: 115 mg/dL — ABNORMAL HIGH (ref 70–99)

## 2019-08-06 MED ORDER — SODIUM CHLORIDE 0.9 % IV SOLN: INTRAVENOUS | Status: DC

## 2019-08-06 NOTE — Progress Notes (Signed)
Subjective: 1 Day Post-Op Procedure(s) (LRB): ANTERIOR APPROACH HEMI HIP ARTHROPLASTY (Left) Patient reports pain as mild. Patient seen at 830 this morning.  She was in bed eating breakfast.  Husband was at the bedside.  She seems to be doing quite well.  She appears energetic.  No complaints.   Objective: Vital signs in last 24 hours: Temp:  [97.6 F (36.4 C)-99.1 F (37.3 C)] 98.1 F (36.7 C) (04/27 0804) Pulse Rate:  [61-90] 90 (04/27 0804) Resp:  [15-18] 18 (04/27 0804) BP: (107-145)/(66-92) 132/69 (04/27 0804) SpO2:  [90 %-100 %] 96 % (04/27 0804) Arterial Line BP: (147-154)/(72-73) 152/73 (04/26 1925)  Intake/Output from previous day: 04/26 0701 - 04/27 0700 In: 1484.3 [I.V.:829.2; IV Piggyback:655.2] Out: 725 [Urine:375; Blood:350] Intake/Output this shift: No intake/output data recorded.  Recent Labs    08/03/19 2220 08/04/19 0805 08/05/19 0155 08/06/19 0439  HGB 14.1 12.5 13.2 11.2*   Recent Labs    08/05/19 0155 08/06/19 0439  WBC 13.3* 9.6  RBC 4.44 3.73*  HCT 41.5 34.4*  PLT 186 172   Recent Labs    08/05/19 0155 08/06/19 0439  NA 133* 132*  K 4.7 4.6  CL 97* 97*  CO2 24 26  BUN 15 19  CREATININE 1.25* 1.13*  GLUCOSE 216* 151*  CALCIUM 8.9 8.7*   Recent Labs    08/03/19 2220  INR 1.1   Left hip exam: Neurovascular intact Sensation intact distally Intact pulses distally Dorsiflexion/Plantar flexion intact Incision: dressing C/D/I Compartment soft Patient is alert and oriented.  Aware of time and place.  Assessment/Plan: 1 Day Post-Op Procedure(s) (LRB): ANTERIOR APPROACH HEMI HIP ARTHROPLASTY (Left)  Plan: May resume her preop dose of Eliquis this morning. Up with therapy Weightbearing as tolerated on the left without hip precautions. I discussed with the patient and her husband that if she does well with physical therapy she may possibly be able to go home with home health PT versus SNF. Would minimize narcotic pain medication in  this patient.Try to use either Tylenol or tramadol for pain. We appreciate cardiology seeing her this morning. Encourage incentive spirometry. Will follow.    Matthew Folks PA-C Mobile number: [phone] 08/06/2019, 10:32 AM

## 2019-08-06 NOTE — TOC Initial Note (Signed)
Transition of Care [facility]) - Initial/Assessment Note    Patient Details  Name: [name] MRN: [id number] Date of Birth: Jun 17, 1930  Transition of Care [facility]) CM/SW Contact:    Curlene Labrum, RN Phone Number: 08/06/2019, 2:37 PM  Clinical Narrative:                 Charonda Hefter Sublettis an 86 y.o.femalepast medical history significant for nonischemic cardiomyopathy, CHF with an EF of 20 to 25% 2019, A. fib on Eliquis, irritable bowel syndrome, depression admitted April 25 after a mechanical fall with left hip fracture.  She was admitted under hospitalist service.  Orthopedics was consulted.  Due to A. fib with rapid heart rate, cardiology was consulted for orthopedic recommendations.  Patient underwent left hemiarthroplasty by orthopedics on 08/05/2019.  Case management met with the patient and family at the bedside to plan discharge needs after surgery.  Patient lives at home with her husband and currently has a rolling walker and canes.  If she goes home with home health, she will need a 3:1 commode ordered.  Patient given information for home health Medicare choice along with Medicare choice for SNF options.  Patient was anxious and not receptive to SNF placement.  The patient's husband mentioned that he may be interested in having her placed in inpatient rehab at [facility] if possible.  Physical Therapy is scheduled to see the patient at 3 pm today - will followup to see if patient needs evaluation for inpatient rehab or home health.  Expected Discharge Plan: [place] Barriers to Discharge: Continued Medical Work up   Patient Goals and CMS Choice Patient states their goals for this hospitalization and ongoing recovery are:: I would like to go home with home health if possible or inpatient therapy at [facility]. CMS Medicare.gov Compare Post Acute Care list provided to:: Patient Choice offered to / list presented to : Patient  Expected Discharge Plan and Services Expected  Discharge Plan: [place]   Discharge Planning Services: CM Consult Post Acute Care Choice: IP Rehab Living arrangements for the past 2 months: Single Family Home                                      Prior Living Arrangements/Services Living arrangements for the past 2 months: Single Family Home Lives with:: Significant Other Patient language and need for interpreter reviewed:: Yes Do you feel safe going back to the place where you live?: Yes      Need for Family Participation in Patient Care: Yes (Comment)     Criminal Activity/Legal Involvement Pertinent to Current Situation/Hospitalization: No - Comment as needed  Activities of Daily Living      Permission Sought/Granted Permission sought to share information with : Case Manager       Permission granted to share info w AGENCY: home health agency or CIR pending therapy today  Permission granted to share info w Relationship: husband and daughter     Emotional Assessment Appearance:: Appears stated age Attitude/Demeanor/Rapport: Gracious, Self-Confident Affect (typically observed): Accepting Orientation: : Oriented to Self, Oriented to Place, Oriented to  Time, Oriented to Situation Alcohol / Substance Use: Not Applicable Psych Involvement: No (comment)  Admission diagnosis:  Hip fracture ([place]) [S72.009A] Pain [R52] Left hip pain [M25.552] Fall, initial encounter [W19.XXXA] Closed displaced fracture of left femoral neck ([place]) [S72.002A] Patient Active Problem List   Diagnosis Date Noted  .  Dysuria 08/06/2019  . Acute kidney injury ([place]) 08/05/2019  . Severe tricuspid regurgitation   . Displaced fracture of left femoral neck ([place]) 08/04/2019  . Ear symptom 12/30/2018  . Chronic diarrhea 09/07/2017  . Hoarseness of voice 09/07/2017  . Total knee replacement status, left 04/25/17 05/06/2017  . Acute combined systolic and diastolic heart failure ([place]) 05/02/2017  . Primary osteoarthritis of  left knee 04/25/2017  . DJD (degenerative joint disease) 04/25/2017  . Healthcare maintenance 02/08/2017  . Vitamin D deficiency 02/08/2017  . Microscopic colitis 09/27/2016  . IBS (irritable bowel syndrome) 08/31/2016  . At risk for falling 01/07/2016  . Chronic combined systolic and diastolic heart failure ([place])   . Rectus sheath hematoma- no anticoagulation   . Atrial fibrillation with RVR- CHADs VASc=4 06/18/2015  . Diarrhea 06/12/2015  . Advance care planning 10/23/2014  . Leg edema 06/27/2012  . Medicare annual wellness visit, subsequent 03/27/2012  . Depression 03/05/2012  . Acute lower UTI 03/05/2012  . EDEMA 06/07/2010  . Osteoporosis 05/23/2010  . KNEE PAIN, LEFT 03/02/2010  . LBBB (left bundle branch block) 10/28/2008  . POST MI SEPTAL DEFECT 06/24/2008  . Cough 06/24/2008  . Nonischemic cardiomyopathy ([place]) 05/30/2008  . ATRIAL FIBRILLATION 05/30/2008  . HYPONATREMIA, HX OF 05/30/2008   PCP:  [name], MD Pharmacy:   [facility], [place] [address], [place] [address] Phone: [phone] Fax: [phone]  [id number] [place] [id number]: [phone]: [phone]    Social Determinants of Health (SDOH) Interventions    Readmission Risk Interventions Readmission Risk Prevention Plan 08/06/2019  Transportation Screening Complete  PCP or Specialist Appt within 5-7 Days Complete  Home Care Screening Complete  Medication Review (RN CM) Complete  Some recent data might be hidden

## 2019-08-06 NOTE — Progress Notes (Deleted)
.  mews

## 2019-08-06 NOTE — Assessment & Plan Note (Signed)
Discussed options.  Check urine culture.  Urinalysis discussed with patient.  Start Keflex.  She wanted to address likely UTI in the meantime and then address residual symptoms/issues later on.  This is reasonable.  She will update me as needed.

## 2019-08-06 NOTE — Progress Notes (Signed)
PROGRESS NOTE    Tasheka Houseman Wenzler  PJA:[id number] DOB: February 13, 1931 DOA: 08/03/2019 PCP: [name], MD   Brief Narrative:  KELLI ROBECK is an 86 y.o. female past medical history significant for nonischemic cardiomyopathy, CHF with an EF of 20 to 25% 2019, A. fib on Eliquis, irritable bowel syndrome, depression admitted April 25 after a mechanical fall with left hip fracture.  She was admitted under hospitalist service.  Orthopedics was consulted.  Due to A. fib with rapid heart rate, cardiology was consulted for orthopedic recommendations.  Patient underwent left hemiarthroplasty by orthopedics on 08/05/2019.  Assessment & Plan:   Principal Problem:   Displaced fracture of left femoral neck (HCC) Active Problems:   Nonischemic cardiomyopathy (HCC)   ATRIAL FIBRILLATION   Depression   Acute lower UTI   IBS (irritable bowel syndrome)   Acute kidney injury ([place])   Severe tricuspid regurgitation   #1.  Left hip fracture.  Mechanical fall.  X-ray reveals fracture of the left femoral neck.  Evaluated by orthopedics and underwent left hemiarthroplasty on 08/05/2019.  Doing fine postoperatively.  Management per orthopedics.  #2.  Acute kidney injury.  Creatinine 1.2> 1.13 this morning.  Continue gentle hydration.  Repeat labs in the morning.  #3.  Atrial fibrillation with a left bundle branch block.  Was having intermittent elevations of her heart rate at admission.  Home medications include amiodarone and dig.  She was evaluated by cardiology for risk stratification who recommended resuming beta-blocker that had recently been discontinued due to increased fatigue and dizziness.  Managed by cardiologist on following medications. -Continue beta-blocker -Continue amiodarone -Continue dig -Continue Eliquis. -Monitor   #4.  Combined CHF.  Does not appear overloaded.  Last echo 2019 with an EF of 20%.  Evaluated by cardiology as noted above.  Cardiology recommends judicious management  of IV fluids -Monitor intake and output -Attain daily weights -Medications as noted above  #5.  Acute blood loss anemia.  Hemoglobin dropped from 13.2-11.2 today.  Postoperative anemia.  No signs of active bleeding.  No indication of transfusion.  Repeat CBC in the morning.  #6.  Recent urinary tract infection.  Antibiotics started April 22.  Patient is afebrile hemodynamically stable and nontoxic-appearing -Continue Keflex through April 28  DVT prophylaxis: Eliquis   Code Status: DNR  Family Communication: Husband present at bedside.  Plan of care discussed with patient in length and he verbalized understanding and agreed with it. Patient is from: Home Disposition Plan: To be determined based on PT recommendations Barriers to discharge: Just had surgery yesterday.  Needs PT evaluation.  Status is: Inpatient  Remains inpatient appropriate because:Unsafe d/c plan   Dispo: The patient is from: Home              Anticipated d/c is to: SNF              Anticipated d/c date is: 2 days              Patient currently is not medically stable to d/c.         Estimated body mass index is 26.63 kg/m as calculated from the following:   Height as of this encounter: 5\' 5"  (1.651 m).   Weight as of this encounter: 72.6 kg.      Nutritional status:               Consultants:   Orthopedics  Procedures:   Left hemiarthroplasty 08/05/2019  Antimicrobials:  Anti-infectives (From admission,  onward)   Start     Dose/Rate Route Frequency Ordered Stop   08/05/19 2230  ceFAZolin (ANCEF) IVPB 2g/100 mL premix     2 g 200 mL/hr over 30 Minutes Intravenous Every 6 hours 08/05/19 2042 08/06/19 0428   08/05/19 0800  ceFAZolin (ANCEF) IVPB 2g/100 mL premix     2 g 200 mL/hr over 30 Minutes Intravenous To [facility] 08/04/19 2229 08/05/19 1713   08/04/19 0200  cephALEXin (KEFLEX) capsule 250 mg     250 mg Oral 2 times daily 08/04/19 0127 08/07/19 2359          Subjective: Seen and examined.  Husband at bedside.  Only complaint is that she wants to have a bowel movement now.  She has only called the nurses.  Pain is controlled.  She has no other complaint.  Objective: Vitals:   08/05/19 2031 08/06/19 0002 08/06/19 0430 08/06/19 0804  BP: 126/79 124/76 134/69 132/69  Pulse: 75 76 73 90  Resp: Temp: 97.7 F (36.5 C) 97.6 F (36.4 C) 98.3 F (36.8 C) 98.1 F (36.7 C)  TempSrc: Oral Oral  Oral  SpO2: 99% 98% 99% 96%  Weight:      Height:        Intake/Output Summary (Last 24 hours) at 08/06/2019 1118 Last data filed at 08/06/2019 0700 Gross per 24 hour  Intake 1484.32 ml  Output 725 ml  Net 759.32 ml   Filed Weights   08/03/19 2244  Weight: 72.6 kg    Examination:  General exam: Appears calm and comfortable  Respiratory system: Clear to auscultation. Respiratory effort normal. Cardiovascular system: S1 & S2 heard, irregularly irregular rate and rhythm. No JVD, murmurs, rubs, gallops or clicks. No pedal edema. Gastrointestinal system: Abdomen is nondistended, soft and nontender. No organomegaly or masses felt. Normal bowel sounds heard. Central nervous system: Alert and oriented. No focal neurological deficits. Skin: No rashes, lesions or ulcers Psychiatry: Judgement and insight appear normal. Mood & affect appropriate.    Data Reviewed: I have personally reviewed following labs and imaging studies  CBC: Recent Labs  Lab 08/03/19 2220 08/04/19 0805 08/05/19 0155 08/06/19 0439  WBC 12.4* 14.5* 13.3* 9.6  HGB 14.1 12.5 13.2 11.2*  HCT 44.0 38.2 41.5 34.4*  MCV 92.8 91.6 93.5 92.2  PLT 237 197 186 172   Basic Metabolic Panel: Recent Labs  Lab 08/03/19 2220 08/04/19 0805 08/05/19 0155 08/06/19 0439  NA 140 137 133* 132*  K 4.3 4.4 4.7 4.6  CL 101 102 97* 97*  CO2 GLUCOSE 113* 118* 216* 151*  BUN CREATININE 0.81 0.72 1.25* 1.13*  CALCIUM 9.6 9.0 8.9 8.7*    GFR: Estimated Creatinine Clearance: 35.7 mL/min (A) (by C-G formula based on SCr of 1.13 mg/dL (H)). Liver Function Tests: No results for input(s): AST, ALT, ALKPHOS, BILITOT, PROT, ALBUMIN in the last 168 hours. No results for input(s): LIPASE, AMYLASE in the last 168 hours. No results for input(s): AMMONIA in the last 168 hours. Coagulation Profile: Recent Labs  Lab 08/03/19 2220  INR 1.1   Cardiac Enzymes: No results for input(s): CKTOTAL, CKMB, CKMBINDEX, TROPONINI in the last 168 hours. BNP (last 3 results) No results for input(s): PROBNP in the last 8760 hours. HbA1C: No results for input(s): HGBA1C in the last 72 hours. CBG: Recent Labs  Lab 08/05/19 1437 08/05/19 2259 08/06/19 0805  GLUCAP 126* 173* 128*   Lipid  Profile: No results for input(s): CHOL, HDL, LDLCALC, TRIG, CHOLHDL, LDLDIRECT in the last 72 hours. Thyroid Function Tests: No results for input(s): TSH, T4TOTAL, FREET4, T3FREE, THYROIDAB in the last 72 hours. Anemia Panel: No results for input(s): VITAMINB12, FOLATE, FERRITIN, TIBC, IRON, RETICCTPCT in the last 72 hours. Sepsis Labs: No results for input(s): PROCALCITON, LATICACIDVEN in the last 168 hours.  Recent Results (from the past 240 hour(s))  Urine Culture     Status: Abnormal   Collection Time: 08/01/19 10:03 AM   Specimen: Urine  Result Value Ref Range Status   MICRO NUMBER: [id number]  Final   SPECIMEN QUALITY: Adequate  Final   Sample Source NOT GIVEN  Final   STATUS: FINAL  Final   ISOLATE 1: Klebsiella pneumoniae (A)  Final    Comment: Greater than 100,000 CFU/mL of Klebsiella pneumoniae      Susceptibility   Klebsiella pneumoniae - URINE CULTURE, REFLEX    AMOX/CLAVULANIC <=2 Sensitive     AMPICILLIN 16 Resistant     AMPICILLIN/SULBACTAM <=2 Sensitive     CEFAZOLIN* <=4 Not Reportable      * For infections other than uncomplicated UTIcaused by E. coli, K. pneumoniae or P. mirabilis:Cefazolin is resistant if MIC > or = 8  mcg/mL.(Distinguishing susceptible versus intermediatefor isolates with MIC < or = 4 mcg/mL requiresadditional testing.)For uncomplicated UTI caused by E. coli,K. pneumoniae or P. mirabilis: Cefazolin issusceptible if MIC <32 mcg/mL and predictssusceptible to the oral agents cefaclor, cefdinir,cefpodoxime, cefprozil, cefuroxime, cephalexinand loracarbef.    CEFEPIME <=1 Sensitive     CEFTRIAXONE <=1 Sensitive     CIPROFLOXACIN <=0.25 Sensitive     LEVOFLOXACIN <=0.12 Sensitive     ERTAPENEM <=0.5 Sensitive     GENTAMICIN <=1 Sensitive     IMIPENEM <=0.25 Sensitive     NITROFURANTOIN 32 Sensitive     PIP/TAZO <=4 Sensitive     TOBRAMYCIN <=1 Sensitive     TRIMETH/SULFA* <=20 Sensitive      * For infections other than uncomplicated UTIcaused by E. coli, K. pneumoniae or P. mirabilis:Cefazolin is resistant if MIC > or = 8 mcg/mL.(Distinguishing susceptible versus intermediatefor isolates with MIC < or = 4 mcg/mL requiresadditional testing.)For uncomplicated UTI caused by E. coli,K. pneumoniae or P. mirabilis: Cefazolin issusceptible if MIC <32 mcg/mL and predictssusceptible to the oral agents cefaclor, cefdinir,cefpodoxime, cefprozil, cefuroxime, cephalexinand loracarbef.Legend:S = Susceptible  I = IntermediateR = Resistant  NS = Not susceptible* = Not tested  NR = Not reported**NN = See antimicrobic comments  Respiratory Panel by RT PCR (Flu A&B, Covid) - Nasopharyngeal Swab     Status: None   Collection Time: 08/03/19 10:16 PM   Specimen: Nasopharyngeal Swab  Result Value Ref Range Status   SARS Coronavirus 2 by RT PCR NEGATIVE NEGATIVE Final    Comment: (NOTE) SARS-CoV-2 target nucleic acids are NOT DETECTED. The SARS-CoV-2 RNA is generally detectable in upper respiratoy specimens during the acute phase of infection. The lowest concentration of SARS-CoV-2 viral copies this assay can detect is 131 copies/mL. A negative result does not preclude SARS-Cov-2 infection and should not be used as the  sole basis for treatment or other patient management decisions. A negative result may occur with  improper specimen collection/handling, submission of specimen other than nasopharyngeal swab, presence of viral mutation(s) within the areas targeted by this assay, and inadequate number of viral copies (<131 copies/mL). A negative result must be combined with clinical observations, patient history, and epidemiological information. The expected result is Negative. Fact Sheet  for Patients:  https://www.moore.com/ Fact Sheet for Healthcare Providers:  https://www.young.biz/ This test is not yet ap proved or cleared by the [place] FDA and  has been authorized for detection and/or diagnosis of SARS-CoV-2 by FDA under an Emergency Use Authorization (EUA). This EUA will remain  in effect (meaning this test can be used) for the duration of the COVID-19 declaration under Section 564(b)(1) of the Act, 21 U.S.C. section 360bbb-3(b)(1), unless the authorization is terminated or revoked sooner.    Influenza A by PCR NEGATIVE NEGATIVE Final   Influenza B by PCR NEGATIVE NEGATIVE Final    Comment: (NOTE) The Xpert Xpress SARS-CoV-2/FLU/RSV assay is intended as an aid in  the diagnosis of influenza from Nasopharyngeal swab specimens and  should not be used as a sole basis for treatment. Nasal washings and  aspirates are unacceptable for Xpert Xpress SARS-CoV-2/FLU/RSV  testing. Fact Sheet for Patients: https://www.moore.com/ Fact Sheet for Healthcare Providers: https://www.young.biz/ This test is not yet approved or cleared by the [place] FDA and  has been authorized for detection and/or diagnosis of SARS-CoV-2 by  FDA under an Emergency Use Authorization (EUA). This EUA will remain  in effect (meaning this test can be used) for the duration of the  Covid-19 declaration under Section 564(b)(1) of the Act, 21   U.S.C. section 360bbb-3(b)(1), unless the authorization is  terminated or revoked. Performed at [facility], [address] [place], [place] [address]   MRSA PCR Screening     Status: None   Collection Time: 08/04/19  4:07 AM   Specimen: Nasal Mucosa; Nasopharyngeal  Result Value Ref Range Status   MRSA by PCR NEGATIVE NEGATIVE Final    Comment:        The GeneXpert MRSA Assay (FDA approved for NASAL specimens only), is one component of a comprehensive MRSA colonization surveillance program. It is not intended to diagnose MRSA infection nor to guide or monitor treatment for MRSA infections. Performed at [facility], [address], [place], [place] [address]       Radiology Studies: DG C-Arm 1-60 Min  Result Date: 08/05/2019 CLINICAL DATA:  Left hip replacement EXAM: OPERATIVE LEFT HIP WITH PELVIS; DG C-ARM 1-60 MIN COMPARISON:  None. FLUOROSCOPY TIME:  Radiation Exposure Index (as provided by the fluoroscopic device): Not available If the device does not provide the exposure index: Fluoroscopy Time:  39 seconds Number of Acquired Images:  2 FINDINGS: Left hip hemiarthroplasty is noted in satisfactory position. No acute bony or soft tissue abnormality is seen. IMPRESSION: Status post left hip hemiarthroplasty. Electronically Signed   By: Alcide Clever M.D.   On: 08/05/2019 19:30   ECHOCARDIOGRAM COMPLETE  Result Date: 08/05/2019    ECHOCARDIOGRAM REPORT   Patient Name:   AFOMIA HIRACHETA Date of Exam: 08/05/2019 Medical Rec #:  [id number]       Height:       65.0 in Accession #:    [id number]      Weight:       160.0 lb Date of Birth:  1931-01-27       BSA:          1.799 m Patient Age:    88 years        BP:           132/78 mmHg Patient Gender: F               HR:  76 bpm. Exam Location:  Inpatient Procedure: 2D Echo, Cardiac Doppler and Color Doppler Indications:    Congestive heart failure 428.0/I50.9  History:        Patient has prior history of  Echocardiogram examinations, most                 recent 08/03/2017. Arrythmias:LBBB and Atrial Fibrillation.                 Non-ischemic cardiomyopathy.  Sonographer:    Ross Ludwig RDCS (AE) Referring Phys: [id number]  Sonographer Comments: Technically difficult study due to poor echo windows. IMPRESSIONS  1. Left ventricular ejection fraction, by estimation, is 35%. The left ventricle has moderate to severely decreased function. The left ventricle demonstrates global hypokinesis. There is moderate left ventricular hypertrophy. Left ventricular diastolic parameters are indeterminate. There is the interventricular septum is flattened in diastole ('D' shaped left ventricle), consistent with right ventricular volume overload.  2. Right ventricular systolic function is moderately reduced. The right ventricular size is moderately enlarged. There is severely elevated pulmonary artery systolic pressure. The estimated right ventricular systolic pressure is 83.2 mmHg.  3. Left atrial size was mildly dilated.  4. Right atrial size was severely dilated.  5. The mitral valve is normal in structure. No evidence of mitral valve regurgitation. No evidence of mitral stenosis.  6. The tricuspid valve is degenerative. Tricuspid valve regurgitation is severe.  7. The aortic valve is grossly normal. Aortic valve regurgitation is not visualized. No aortic stenosis is present.  8. The inferior vena cava is dilated in size with <50% respiratory variability, suggesting right atrial pressure of 15 mmHg. Comparison(s): A prior study was performed on 08/03/2017. Prior images reviewed side by side. RV size has increased and systolic function decreased. TR has increased from mild-moderate to severe. LV function appears slightly improved. FINDINGS  Left Ventricle: Left ventricular ejection fraction, by estimation, is 35%. The left ventricle has moderate to severely decreased function. The left ventricle demonstrates global hypokinesis.  The left ventricular internal cavity size was normal in size. There is moderate left ventricular hypertrophy. Abnormal (paradoxical) septal motion, consistent with left bundle branch block and the interventricular septum is flattened in diastole ('D' shaped left ventricle), consistent with right ventricular volume overload. Left ventricular diastolic parameters are indeterminate. Right Ventricle: The right ventricular size is moderately enlarged. No increase in right ventricular wall thickness. Right ventricular systolic function is moderately reduced. There is severely elevated pulmonary artery systolic pressure. The tricuspid regurgitant velocity is 4.13 m/s, and with an assumed right atrial pressure of 15 mmHg, the estimated right ventricular systolic pressure is 83.2 mmHg. Left Atrium: Left atrial size was mildly dilated. Right Atrium: Right atrial size was severely dilated. Pericardium: Trivial pericardial effusion is present. Mitral Valve: The mitral valve is normal in structure. Normal mobility of the mitral valve leaflets. No evidence of mitral valve regurgitation. No evidence of mitral valve stenosis. Tricuspid Valve: The tricuspid valve is degenerative in appearance. Tricuspid valve regurgitation is severe. No evidence of tricuspid stenosis. Aortic Valve: The aortic valve is grossly normal.. There is mild thickening and mild calcification of the aortic valve. Aortic valve regurgitation is not visualized. No aortic stenosis is present. There is mild thickening of the aortic valve. There is mild calcification of the aortic valve. Pulmonic Valve: The pulmonic valve was normal in structure. Pulmonic valve regurgitation is trivial. No evidence of pulmonic stenosis. Aorta: The aortic root and ascending aorta are structurally normal, with no evidence  of dilitation. Venous: The inferior vena cava is dilated in size with less than 50% respiratory variability, suggesting right atrial pressure of 15 mmHg. IAS/Shunts:  No atrial level shunt detected by color flow Doppler.  LEFT VENTRICLE PLAX 2D LVIDd:         3.41 cm LVIDs:         2.92 cm LV PW:         1.85 cm LV IVS:        1.40 cm LVOT diam:     1.90 cm LV SV:         24 LV SV Index:   14 LVOT Area:     2.84 cm  LV Volumes (MOD) LV vol d, MOD A2C: 56.2 ml LV vol d, MOD A4C: 87.7 ml LV vol s, MOD A2C: 40.4 ml LV vol s, MOD A4C: 59.2 ml LV SV MOD A2C:     15.8 ml LV SV MOD A4C:     87.7 ml LV SV MOD BP:      23.5 ml RIGHT VENTRICLE            IVC RV Basal diam:  4.46 cm    IVC diam: 2.31 cm RV Mid diam:    4.36 cm RV S prime:     6.46 cm/s TAPSE (M-mode): 1.0 cm LEFT ATRIUM           Index       RIGHT ATRIUM           Index LA diam:      4.00 cm 2.22 cm/m  RA Area:     26.10 cm LA Vol (A4C): 55.5 ml 30.85 ml/m RA Volume:   83.30 ml  46.30 ml/m  AORTIC VALVE LVOT Vmax:   55.92 cm/s LVOT Vmean:  38.420 cm/s LVOT VTI:    0.086 m  AORTA Ao Root diam: 3.50 cm Ao Asc diam:  3.60 cm TRICUSPID VALVE TR Peak grad:   68.2 mmHg TR Vmax:        413.00 cm/s  SHUNTS Systemic VTI:  0.09 m Systemic Diam: 1.90 cm Weston Brass MD Electronically signed by Weston Brass MD Signature Date/Time: 08/05/2019/5:02:38 PM    Final    DG HIP OPERATIVE UNILAT W OR W/O PELVIS LEFT  Result Date: 08/05/2019 CLINICAL DATA:  Left hip replacement EXAM: OPERATIVE LEFT HIP WITH PELVIS; DG C-ARM 1-60 MIN COMPARISON:  None. FLUOROSCOPY TIME:  Radiation Exposure Index (as provided by the fluoroscopic device): Not available If the device does not provide the exposure index: Fluoroscopy Time:  39 seconds Number of Acquired Images:  2 FINDINGS: Left hip hemiarthroplasty is noted in satisfactory position. No acute bony or soft tissue abnormality is seen. IMPRESSION: Status post left hip hemiarthroplasty. Electronically Signed   By: Alcide Clever M.D.   On: 08/05/2019 19:30    Scheduled Meds: . acetaminophen  1,000 mg Oral TID  . amiodarone  200 mg Oral Daily  . apixaban  2.5 mg Oral BID  . cephALEXin   250 mg Oral BID  . Chlorhexidine Gluconate Cloth  6 each Topical Daily  . digoxin  0.125 mg Oral Daily  . insulin aspart  0-15 Units Subcutaneous TID WC  . insulin aspart  0-5 Units Subcutaneous QHS  . senna  1 tablet Oral BID  . sertraline  100 mg Oral Daily   Continuous Infusions: . sodium chloride 50 mL/hr at 08/06/19 0908  . methocarbamol (ROBAXIN) IV       LOS:  2 days   Time spent: 35 minutes   Hughie Closs, MD [facility]  08/06/2019, 11:18 AM   To contact the attending provider between 7A-7P or the covering provider during after hours 7P-7A, please log into the web site [email or web address].

## 2019-08-06 NOTE — Progress Notes (Signed)
Pt found to have a stage 2 skin tear on left sacral area; foam dressing in placed; wound care consult order also in placed.

## 2019-08-06 NOTE — Progress Notes (Signed)
MEWS protocol followed for 14:39 & 15:50 yellow MEWS; Pt assessed; both Primary MD and Cardio on call notified Pt ordered to be transferred to progressive unit.

## 2019-08-06 NOTE — Evaluation (Signed)
Physical Therapy Evaluation Patient Details Name: [name] MRN: [id number] DOB: 1931-02-28 Today's Date: 08/06/2019   History of Present Illness  [name] is an 86 y.o. female past medical history significant for nonischemic cardiomyopathy, CHF with an EF of 20 to 25% 2019, A. fib on Eliquis, irritable bowel syndrome, depression, L TKA admitted April 25 after a mechanical fall with left hip fracture.    Patient underwent left hemiarthroplasty on 08/05/2019  Clinical Impression  Pt admitted with above diagnosis. Pt presents with L thigh pain after L THA. Required mod A to come to sitting EOB. Pt tolerated mobility better once in a sitting position. Mod A for sit<>stand with vc's for hand placement and fwd wt shift/ power up. Pt ambulated 2' to chair with RW and mod A. Needs very specific vc's as she is somewhat internally distracted. Pt very motivated to get moving again and would benefit from short CIR stay.  Pt currently with functional limitations due to the deficits listed below (see PT Problem List). Pt will benefit from skilled PT to increase their independence and safety with mobility to allow discharge to the venue listed below.       Follow Up Recommendations CIR;Supervision/Assistance - 24 hour    Equipment Recommendations  3in1 (PT)    Recommendations for Other Services Rehab consult;OT consult     Precautions / Restrictions Precautions Precautions: Fall Restrictions Weight Bearing Restrictions: Yes LLE Weight Bearing: Weight bearing as tolerated      Mobility  Bed Mobility Overal bed mobility: Needs Assistance Bed Mobility: Supine to Sit     Supine to sit: Mod assist     General bed mobility comments: mod A for mvmt of LLE as well as trunk to come up to sitting  Transfers Overall transfer level: Needs assistance Equipment used: Rolling walker (2 wheeled) Transfers: Sit to/from Stand Sit to Stand: Mod assist         General transfer comment: mod  A for fwd wt shift and power up, vc's for hand placement  Ambulation/Gait Ambulation/Gait assistance: Mod assist Gait Distance (Feet): 2 Feet Assistive device: Rolling walker (2 wheeled) Gait Pattern/deviations: Step-to pattern Gait velocity: decreased Gait velocity interpretation: <1.31 ft/sec, indicative of household ambulator General Gait Details: small steps to recliner with vc's for each step and mvmt of RW, difficulty advancing LLE  Stairs            Wheelchair Mobility    Modified Rankin (Stroke Patients Only)       Balance Overall balance assessment: Needs assistance;History of Falls Sitting-balance support: Bilateral upper extremity supported;Feet supported Sitting balance-Leahy Scale: Fair     Standing balance support: Bilateral upper extremity supported Standing balance-Leahy Scale: Poor                               Pertinent Vitals/Pain Pain Assessment: 0-10 Pain Score: 5  Pain Location: L hip Pain Descriptors / Indicators: Sore Pain Intervention(s): Limited activity within patient's tolerance;Monitored during session    Home Living Family/patient expects to be discharged to:: Private residence Living Arrangements: Spouse/significant other Available Help at Discharge: Family;Available 24 hours/day Type of Home: House Home Access: Level entry     Home Layout: Able to live on main level with bedroom/bathroom;Two level Home Equipment: Walker - 2 wheels;Shower seat      Prior Function Level of Independence: Independent with assistive device(s)         Comments: pt  had begun using cane due to having "dizzy spells"     Hand Dominance        Extremity/Trunk Assessment   Upper Extremity Assessment Upper Extremity Assessment: Defer to OT evaluation    Lower Extremity Assessment Lower Extremity Assessment: Generalized weakness;LLE deficits/detail LLE Deficits / Details: hip flex 2-/5, knee ext 2/5 LLE: Unable to fully assess  due to pain LLE Sensation: WNL LLE Coordination: decreased gross motor    Cervical / Trunk Assessment Cervical / Trunk Assessment: Kyphotic  Communication   Communication: No difficulties  Cognition Arousal/Alertness: Awake/alert Behavior During Therapy: WFL for tasks assessed/performed Overall Cognitive Status: History of cognitive impairments - at baseline Area of Impairment: Memory                     Memory: Decreased short-term memory         General Comments: somewhat internally distracted, today she was focused on needing to have a BM      General Comments General comments (skin integrity, edema, etc.): HR 109-137 bpm    Exercises Total Joint Exercises Ankle Circles/Pumps: AROM;Both;10 reps;Supine;Seated Quad Sets: AROM;Both;10 reps;Seated Gluteal Sets: AROM;Both;10 reps;Seated   Assessment/Plan    PT Assessment Patient needs continued PT services  PT Problem List Decreased strength;Decreased range of motion;Decreased activity tolerance;Decreased balance;Decreased mobility;Decreased coordination;Decreased cognition;Decreased knowledge of use of DME;Decreased safety awareness;Decreased knowledge of precautions;Pain;Cardiopulmonary status limiting activity       PT Treatment Interventions DME instruction;Gait training;Functional mobility training;Therapeutic activities;Therapeutic exercise;Balance training;Patient/family education;Cognitive remediation    PT Goals (Current goals can be found in the Care Plan section)  Acute Rehab PT Goals Patient Stated Goal: rehab and then home PT Goal Formulation: With patient Time For Goal Achievement: 08/20/19 Potential to Achieve Goals: Good    Frequency Min 4X/week   Barriers to discharge        Co-evaluation               AM-PAC PT "6 Clicks" Mobility  Outcome Measure Help needed turning from your back to your side while in a flat bed without using bedrails?: A Lot Help needed moving from lying on  your back to sitting on the side of a flat bed without using bedrails?: A Lot Help needed moving to and from a bed to a chair (including a wheelchair)?: A Lot Help needed standing up from a chair using your arms (e.g., wheelchair or bedside chair)?: A Lot Help needed to walk in hospital room?: Total Help needed climbing 3-5 steps with a railing? : Total 6 Click Score: 10    End of Session Equipment Utilized During Treatment: Gait belt Activity Tolerance: Patient tolerated treatment well Patient left: in chair;with chair alarm set;with call bell/phone within reach;with family/visitor present Nurse Communication: Mobility status(stedy for back to bed) PT Visit Diagnosis: Unsteadiness on feet (R26.81);Repeated falls (R29.6);Difficulty in walking, not elsewhere classified (R26.2);Pain;Muscle weakness (generalized) (M62.81) Pain - Right/Left: Left Pain - part of body: Hip    Time: 6948-5462 PT Time Calculation (min) (ACUTE ONLY): 43 min   Charges:   PT Evaluation $PT Eval Moderate Complexity: 1 Mod PT Treatments $Gait Training: 8-22 mins $Therapeutic Activity: 8-22 mins        Lyanne Co, PT  Acute Rehab Services  Pager [phone] Office [phone]   Lawana Chambers Romone Shaff 08/06/2019, 4:50 PM

## 2019-08-06 NOTE — Progress Notes (Addendum)
Progress Note  Patient Name: [name] Date of Encounter: 08/06/2019  Primary Cardiologist: [name], MD   Subjective   Pt recovering well after R hip surgery, has not been oob. Aware her HR is up a little today. No CP or SOB, no palps  Inpatient Medications    Scheduled Meds:  acetaminophen  1,000 mg Oral TID   amiodarone  200 mg Oral Daily   apixaban  2.5 mg Oral BID   cephALEXin  250 mg Oral BID   Chlorhexidine Gluconate Cloth  6 each Topical Daily   digoxin  0.125 mg Oral Daily   insulin aspart  0-15 Units Subcutaneous TID WC   insulin aspart  0-5 Units Subcutaneous QHS   senna  1 tablet Oral BID   sertraline  100 mg Oral Daily   Continuous Infusions:  sodium chloride 50 mL/hr at 08/06/19 0908   methocarbamol (ROBAXIN) IV     PRN Meds: acetaminophen, alum & mag hydroxide-simeth, bisacodyl, calcium carbonate, magnesium citrate, methocarbamol **OR** methocarbamol (ROBAXIN) IV, morphine injection, ondansetron **OR** ondansetron (ZOFRAN) IV, polyvinyl alcohol, traMADol   Vital Signs    Vitals:   08/05/19 2031 08/06/19 0002 08/06/19 0430 08/06/19 0804  BP: 126/79 124/76 134/69 132/69  Pulse: 75 76 73 90  Resp: 16 16 16 18   Temp: 97.7 F (36.5 C) 97.6 F (36.4 C) 98.3 F (36.8 C) 98.1 F (36.7 C)  TempSrc: Oral Oral  Oral  SpO2: 99% 98% 99% 96%  Weight:      Height:        Intake/Output Summary (Last 24 hours) at 08/06/2019 0940 Last data filed at 08/06/2019 0700 Gross per 24 hour  Intake 1484.32 ml  Output 725 ml  Net 759.32 ml   Last 3 Weights 08/03/2019 08/01/2019 07/29/2019  Weight (lbs) 160 lb 162 lb 2 oz 161 lb 6.4 oz  Weight (kg) 72.576 kg 73.539 kg 73.211 kg      Telemetry    Afib, HR trended up overnight, now 100s- Personally Reviewed  ECG    No new - Personally Reviewed  Physical Exam   GEN: No acute distress.   Neck: mild JVD Cardiac: Irreg R&R, 2/6 murmurs, rubs, or gallops.  Respiratory: diminished to  auscultation bilaterally with rales in the bases. GI: Soft, nontender, non-distended  MS: No edema; R hip incision w/out drainage Neuro:  Nonfocal  Psych: Normal affect   Labs    High Sensitivity Troponin:  No results for input(s): TROPONINIHS in the last 720 hours.    Chemistry Recent Labs  Lab 08/04/19 0805 08/05/19 0155 08/06/19 0439  NA 137 133* 132*  K 4.4 4.7 4.6  CL 102 97* 97*  CO2 25 24 26   GLUCOSE 118* 216* 151*  BUN 10 15 19   CREATININE 0.72 1.25* 1.13*  CALCIUM 9.0 8.9 8.7*  GFRNONAA >60 39* 44*  GFRAA >60 45* 51*  ANIONGAP 10 12 9      Hematology Recent Labs  Lab 08/04/19 0805 08/05/19 0155 08/06/19 0439  WBC 14.5* 13.3* 9.6  RBC 4.17 4.44 3.73*  HGB 12.5 13.2 11.2*  HCT 38.2 41.5 34.4*  MCV 91.6 93.5 92.2  MCH 30.0 29.7 30.0  MCHC 32.7 31.8 32.6  RDW 14.2 14.3 13.9  PLT 197 186 172    BNPNo results for input(s): BNP, PROBNP in the last 168 hours.   DDimer No results for input(s): DDIMER in the last 168 hours.   Radiology    DG C-Arm 1-60 Min  Result  Date: 08/05/2019 CLINICAL DATA:  Left hip replacement EXAM: OPERATIVE LEFT HIP WITH PELVIS; DG C-ARM 1-60 MIN COMPARISON:  None. FLUOROSCOPY TIME:  Radiation Exposure Index (as provided by the fluoroscopic device): Not available If the device does not provide the exposure index: Fluoroscopy Time:  39 seconds Number of Acquired Images:  2 FINDINGS: Left hip hemiarthroplasty is noted in satisfactory position. No acute bony or soft tissue abnormality is seen. IMPRESSION: Status post left hip hemiarthroplasty. Electronically Signed   By: Alcide Clever M.D.   On: 08/05/2019 19:30   ECHOCARDIOGRAM COMPLETE  Result Date: 08/05/2019    ECHOCARDIOGRAM REPORT   Patient Name:   [name] Date of Exam: 08/05/2019 Medical Rec #:  [id number]       Height:       65.0 in Accession #:    [id number]      Weight:       160.0 lb Date of Birth:  [birth year]       BSA:          1.799 m Patient Age:    86 years        BP:            132/78 mmHg Patient Gender: F               HR:           76 bpm. Exam Location:  Inpatient Procedure: 2D Echo, Cardiac Doppler and Color Doppler Indications:    Congestive heart failure 428.0/I50.9  History:        Patient has prior history of Echocardiogram examinations, most                 recent 08/03/2017. Arrythmias:LBBB and Atrial Fibrillation.                 Non-ischemic cardiomyopathy.  Sonographer:    Ross Ludwig RDCS (AE) Referring Phys: [id number] JESSICA [facility]  Sonographer Comments: Technically difficult study due to poor echo windows. IMPRESSIONS  1. Left ventricular ejection fraction, by estimation, is 35%. The left ventricle has moderate to severely decreased function. The left ventricle demonstrates global hypokinesis. There is moderate left ventricular hypertrophy. Left ventricular diastolic parameters are indeterminate. There is the interventricular septum is flattened in diastole ('D' shaped left ventricle), consistent with right ventricular volume overload.  2. Right ventricular systolic function is moderately reduced. The right ventricular size is moderately enlarged. There is severely elevated pulmonary artery systolic pressure. The estimated right ventricular systolic pressure is 83.2 mmHg.  3. Left atrial size was mildly dilated.  4. Right atrial size was severely dilated.  5. The mitral valve is normal in structure. No evidence of mitral valve regurgitation. No evidence of mitral stenosis.  6. The tricuspid valve is degenerative. Tricuspid valve regurgitation is severe.  7. The aortic valve is grossly normal. Aortic valve regurgitation is not visualized. No aortic stenosis is present.  8. The inferior vena cava is dilated in size with <50% respiratory variability, suggesting right atrial pressure of 15 mmHg. Comparison(s): A prior study was performed on 08/03/2017. Prior images reviewed side by side. RV size has increased and systolic function decreased. TR has increased from  mild-moderate to severe. LV function appears slightly improved. FINDINGS  Left Ventricle: Left ventricular ejection fraction, by estimation, is 35%. The left ventricle has moderate to severely decreased function. The left ventricle demonstrates global hypokinesis. The left ventricular internal cavity size was normal in size. There is moderate left ventricular hypertrophy. Abnormal (  paradoxical) septal motion, consistent with left bundle branch block and the interventricular septum is flattened in diastole ('D' shaped left ventricle), consistent with right ventricular volume overload. Left ventricular diastolic parameters are indeterminate. Right Ventricle: The right ventricular size is moderately enlarged. No increase in right ventricular wall thickness. Right ventricular systolic function is moderately reduced. There is severely elevated pulmonary artery systolic pressure. The tricuspid regurgitant velocity is 4.13 m/s, and with an assumed right atrial pressure of 15 mmHg, the estimated right ventricular systolic pressure is 83.2 mmHg. Left Atrium: Left atrial size was mildly dilated. Right Atrium: Right atrial size was severely dilated. Pericardium: Trivial pericardial effusion is present. Mitral Valve: The mitral valve is normal in structure. Normal mobility of the mitral valve leaflets. No evidence of mitral valve regurgitation. No evidence of mitral valve stenosis. Tricuspid Valve: The tricuspid valve is degenerative in appearance. Tricuspid valve regurgitation is severe. No evidence of tricuspid stenosis. Aortic Valve: The aortic valve is grossly normal.. There is mild thickening and mild calcification of the aortic valve. Aortic valve regurgitation is not visualized. No aortic stenosis is present. There is mild thickening of the aortic valve. There is mild calcification of the aortic valve. Pulmonic Valve: The pulmonic valve was normal in structure. Pulmonic valve regurgitation is trivial. No evidence of  pulmonic stenosis. Aorta: The aortic root and ascending aorta are structurally normal, with no evidence of dilitation. Venous: The inferior vena cava is dilated in size with less than 50% respiratory variability, suggesting right atrial pressure of 15 mmHg. IAS/Shunts: No atrial level shunt detected by color flow Doppler.  LEFT VENTRICLE PLAX 2D LVIDd:         3.41 cm LVIDs:         2.92 cm LV PW:         1.85 cm LV IVS:        1.40 cm LVOT diam:     1.90 cm LV SV:         24 LV SV Index:   14 LVOT Area:     2.84 cm  LV Volumes (MOD) LV vol d, MOD A2C: 56.2 ml LV vol d, MOD A4C: 87.7 ml LV vol s, MOD A2C: 40.4 ml LV vol s, MOD A4C: 59.2 ml LV SV MOD A2C:     15.8 ml LV SV MOD A4C:     87.7 ml LV SV MOD BP:      23.5 ml RIGHT VENTRICLE            IVC RV Basal diam:  4.46 cm    IVC diam: 2.31 cm RV Mid diam:    4.36 cm RV S prime:     6.46 cm/s TAPSE (M-mode): 1.0 cm LEFT ATRIUM           Index       RIGHT ATRIUM           Index LA diam:      4.00 cm 2.22 cm/m  RA Area:     26.10 cm LA Vol (A4C): 55.5 ml 30.85 ml/m RA Volume:   83.30 ml  46.30 ml/m  AORTIC VALVE LVOT Vmax:   55.92 cm/s LVOT Vmean:  38.420 cm/s LVOT VTI:    0.086 m  AORTA Ao Root diam: 3.50 cm Ao Asc diam:  3.60 cm TRICUSPID VALVE TR Peak grad:   68.2 mmHg TR Vmax:        413.00 cm/s  SHUNTS Systemic VTI:  0.09 m Systemic Diam: 1.90 cm Lynda Rainwater  Margaretann Loveless MD Electronically signed by Cherlynn Kaiser MD Signature Date/Time: 08/05/2019/5:02:38 PM    Final    DG HIP OPERATIVE UNILAT W OR W/O PELVIS LEFT  Result Date: 08/05/2019 CLINICAL DATA:  Left hip replacement EXAM: OPERATIVE LEFT HIP WITH PELVIS; DG C-ARM 1-60 MIN COMPARISON:  None. FLUOROSCOPY TIME:  Radiation Exposure Index (as provided by the fluoroscopic device): Not available If the device does not provide the exposure index: Fluoroscopy Time:  39 seconds Number of Acquired Images:  2 FINDINGS: Left hip hemiarthroplasty is noted in satisfactory position. No acute bony or soft tissue  abnormality is seen. IMPRESSION: Status post left hip hemiarthroplasty. Electronically Signed   By: Inez Catalina M.D.   On: 08/05/2019 19:30    Cardiac Studies   Echo: 08/05/2019 1. Left ventricular ejection fraction, by estimation, is 35%. The left  ventricle has moderate to severely decreased function. The left ventricle  demonstrates global hypokinesis. There is moderate left ventricular  hypertrophy. Left ventricular diastolic  parameters are indeterminate. There is the interventricular septum is  flattened in diastole ('D' shaped left ventricle), consistent with right  ventricular volume overload.  2. Right ventricular systolic function is moderately reduced. The right  ventricular size is moderately enlarged. There is severely elevated  pulmonary artery systolic pressure. The estimated right ventricular  systolic pressure is 16.1 mmHg.  3. Left atrial size was mildly dilated.  4. Right atrial size was severely dilated.  5. The mitral valve is normal in structure. No evidence of mitral valve  regurgitation. No evidence of mitral stenosis.  6. The tricuspid valve is degenerative. Tricuspid valve regurgitation is  severe.  7. The aortic valve is grossly normal. Aortic valve regurgitation is not  visualized. No aortic stenosis is present.  8. The inferior vena cava is dilated in size with <50% respiratory  variability, suggesting right atrial pressure of 15 mmHg.   Echo 2019 Study Conclusions   - Left ventricle: The cavity size was normal. Wall thickness was  increased in a pattern of mild LVH. Systolic function was  severely reduced. The estimated ejection fraction was in the  range of 20% to 25%. Diffuse hypokinesis. There is dyskinesis of  the anteroseptal myocardium. The study is not technically  sufficient to allow evaluation of LV diastolic function.  - Aortic root: The aortic root was normal in size.  - Mitral valve: Calcified annulus.  - Left atrium:  The atrium was moderately dilated.  - Pulmonary arteries: Systolic pressure was mildly to moderately  increased.   Patient Profile     86 y.o. female with a hx of atrial fibrillation and chronic systolic heart failure who is being seen today for preoperative risk stratification.  Assessment & Plan    Permanent Afib - continue amio and dig - add low-dose BB - Cr up some, ck Dig level  Chronic systolic HF - see echo report, EF is a little better, but elevated pressures RVSP 82, RA 15 - w/ elevated Cr, discuss IV Lasix w/ MD  S/p fall w/ R hip fx and repair - doing well post-op - incentive spirometer   For questions or updates, please contact [facility] Please consult [email or web address] for contact info under        Signed, [name], PA-C  08/06/2019, 9:40 AM    I have seen and examined the patient along with [name], PA-C .  I have reviewed the chart, notes and new data.  I agree with PA/NP's note.  Key new complaints:  Feeling better than yesterday, pain markedly improved, although she still has an occasional muscle cramp in the anterior left hip.  Denies dyspnea and looks very comfortable. Key examination changes: Elevated JVP (prominent V waves consistent with severe TR), but no edema, clear lungs and no third heart. Key new findings / data: Chest x-ray does not show any evidence of congestive heart failure, expected cardiomegaly due to right heart enlargement.  PLAN: Avoid excessive IV fluids, but she appears euvolemic and does not need diuretics at this time. Resume direct oral anticoagulant (apixaban 2.5 mg twice daily) as soon as felt to be safe by the surgery team.  This is indicated not only for DVT prophylaxis, but chronically for atrial fibrillation.  Thurmon Fair, MD, [facility] [phone] 08/06/2019, 2:00 PM

## 2019-08-07 DIAGNOSIS — I5022 Chronic systolic (congestive) heart failure: Secondary | ICD-10-CM

## 2019-08-07 LAB — BASIC METABOLIC PANEL
Anion gap: 14 (ref 5–15)
BUN: 17 mg/dL (ref 8–23)
CO2: 22 mmol/L (ref 22–32)
Calcium: 9.4 mg/dL (ref 8.9–10.3)
Chloride: 99 mmol/L (ref 98–111)
Creatinine, Ser: 0.72 mg/dL (ref 0.44–1.00)
GFR calc Af Amer: >60 mL/min (ref >60)
GFR calc non Af Amer: >60 mL/min (ref >60)
Glucose, Bld: 111 mg/dL — ABNORMAL HIGH (ref 70–99)
Potassium: 3.8 mmol/L (ref 3.5–5.1)
Sodium: 135 mmol/L (ref 135–145)

## 2019-08-07 LAB — GLUCOSE, CAPILLARY
Glucose-Capillary: 107 mg/dL — ABNORMAL HIGH (ref 70–99)
Glucose-Capillary: 97 mg/dL (ref 70–99)
Glucose-Capillary: 113 mg/dL — ABNORMAL HIGH (ref 70–99)
Glucose-Capillary: 119 mg/dL — ABNORMAL HIGH (ref 70–99)
Glucose-Capillary: 106 mg/dL — ABNORMAL HIGH (ref 70–99)

## 2019-08-07 LAB — CBC
HCT: 29.3 % — ABNORMAL LOW (ref 36.0–46.0)
Hemoglobin: 9.3 g/dL — ABNORMAL LOW (ref 12.0–15.0)
MCH: 29.1 pg (ref 26.0–34.0)
MCHC: 31.7 g/dL (ref 30.0–36.0)
MCV: 91.6 fL (ref 80.0–100.0)
Platelets: 180 10*3/uL (ref 150–400)
RBC: 3.2 MIL/uL — ABNORMAL LOW (ref 3.87–5.11)
RDW: 14.1 % (ref 11.5–15.5)
WBC: 10.6 10*3/uL — ABNORMAL HIGH (ref 4.0–10.5)
nRBC: 0 % (ref 0.0–0.2)

## 2019-08-07 LAB — DIGOXIN LEVEL: Digoxin Level: 0.6 ng/mL — ABNORMAL LOW (ref 0.8–2.0)

## 2019-08-07 MED ORDER — METOPROLOL TARTRATE 25 MG PO TABS
25.0000 mg | ORAL_TABLET | Freq: Two times a day (BID) | ORAL | Status: DC
  Administered 2019-08-07 – 2019-08-10 (×7): 25 mg via ORAL
  Filled 2019-08-07 (×7): qty 1

## 2019-08-07 MED ORDER — FERROUS SULFATE 325 (65 FE) MG PO TABS
325.0000 mg | ORAL_TABLET | Freq: Every day | ORAL | Status: DC
  Administered 2019-08-07 – 2019-08-12 (×6): 325 mg via ORAL
  Filled 2019-08-07 (×6): qty 1

## 2019-08-07 NOTE — Progress Notes (Signed)
PROGRESS NOTE    [name]  ZOX:[id number] DOB: 16-Nov-1930 DOA: 08/03/2019 PCP: [name], MD   Brief Narrative:  [name] is an 86 y.o. female past medical history significant for nonischemic cardiomyopathy, CHF with an EF of 20 to 25% 2019, A. fib on Eliquis, irritable bowel syndrome, depression admitted April 25 after a mechanical fall with left hip fracture.  She was admitted under hospitalist service.  Orthopedics was consulted.  Due to A. fib with rapid heart rate, cardiology was consulted for orthopedic recommendations.  Patient underwent left hemiarthroplasty by orthopedics on 08/05/2019.  Patient then developed A. fib with RVR on the evening of 08/06/2019 and was moved to progressive unit.  Assessment & Plan:   Principal Problem:   Displaced fracture of left femoral neck (HCC) Active Problems:   Nonischemic cardiomyopathy (HCC)   ATRIAL FIBRILLATION   Depression   Acute lower UTI   IBS (irritable bowel syndrome)   Acute kidney injury (HCC)   Severe tricuspid regurgitation  #1.  Left hip fracture.  Mechanical fall.  X-ray reveals fracture of the left femoral neck.  Evaluated by orthopedics and underwent left hemiarthroplasty on 08/05/2019.  Doing fine postoperatively.  Management per orthopedics.  #2.  Acute kidney injury.  Creatinine 1.2> 1.13> 0.72 this morning.  Resolved.  Discontinue IV fluids.  #3.  Atrial fibrillation with a left bundle branch block.  Was having intermittent elevations of her heart rate at admission.  Home medications include amiodarone and dig.  She was evaluated by cardiology for risk stratification who recommended resuming beta-blocker that had recently been discontinued due to increased fatigue and dizziness.  Managed by cardiologist on following medications. -Continue beta-blocker -Continue amiodarone -Continue dig -Continue Eliquis. -Monitor  #4.  Combined CHF.  Does not appear overloaded.  Last echo 2019 with an EF of 20%.   Evaluated by cardiology as noted above.  Cardiology recommends judicious management of IV fluids -Monitor intake and output -Attain daily weights -Medications as noted above  #5.  Acute blood loss anemia.  Hemoglobin dropped from 13.2-11.2> 9.3 today.  Postoperative anemia.  No signs of active bleeding.  No indication of transfusion.  Continue to monitor daily.  #6.  Recent urinary tract infection.  Antibiotics started April 22.  Patient is afebrile hemodynamically stable and nontoxic-appearing -Continue Keflex through April 28   DVT prophylaxis: Eliquis   Code Status: DNR  Family Communication: Husband present at bedside.  Plan of care discussed with patient in length and he verbalized understanding and agreed with it. Patient is from: Home Disposition Plan: PT recommends CIR. Barriers to discharge: Waiting for CIR evaluation control.  Status is: Inpatient  Remains inpatient appropriate because:Unsafe d/c plan   Dispo: The patient is from: Home              Anticipated d/c is to: CIR              Anticipated d/c date is: 2 days              Patient currently is not medically stable to d/c.         Estimated body mass index is 26.63 kg/m as calculated from the following:   Height as of this encounter:  (1.651 m).   Weight as of this encounter: 72.6 kg.      Nutritional status:               Consultants:   Orthopedics  Procedures:   Left  hemiarthroplasty 08/05/2019  Antimicrobials:  Anti-infectives (From admission, onward)   Start     Dose/Rate Route Frequency Ordered Stop   08/05/19 2230  ceFAZolin (ANCEF) IVPB 2g/100 mL premix     2 g 200 mL/hr over 30 Minutes Intravenous Every 6 hours 08/05/19 2042 08/06/19 0428   08/05/19 0800  ceFAZolin (ANCEF) IVPB 2g/100 mL premix     2 g 200 mL/hr over 30 Minutes Intravenous To [facility] 08/04/19 2229 08/05/19 1713   08/04/19 0200  cephALEXin (KEFLEX) capsule 250 mg     250 mg Oral 2 times  daily 08/04/19 0127 08/07/19 2359         Subjective: Patient seen and examined.  Husband at the bedside.  She feels better with very minimal pain.  While I am in her room, her heart rate continued to jump between 100-120.  She did not have any palpitation, chest pain or shortness of breath.  Objective: Vitals:   08/07/19 0430 08/07/19 0630 08/07/19 0733 08/07/19 0959  BP:   103/62 120/63  Pulse: (!) 45 95 75 84  Resp:  20 19 15   Temp:   98 F (36.7 C)   TempSrc:   Oral   SpO2:   93%   Weight:      Height:        Intake/Output Summary (Last 24 hours) at 08/07/2019 1009 Last data filed at 08/06/2019 2050 Gross per 24 hour  Intake 600 ml  Output --  Net 600 ml   Filed Weights   08/03/19 2244  Weight: 72.6 kg    Examination:  General exam: Appears calm and comfortable  Respiratory system: Clear to auscultation. Respiratory effort normal. Cardiovascular system: S1 & S2 heard, irregularly irregular rate and rhythm. No JVD, murmurs, rubs, gallops or clicks. No pedal edema. Gastrointestinal system: Abdomen is nondistended, soft and nontender. No organomegaly or masses felt. Normal bowel sounds heard. Central nervous system: Alert and oriented. No focal neurological deficits. Extremities: Symmetric 5 x 5 power. Skin: No rashes, lesions or ulcers.  Psychiatry: Judgement and insight appear normal. Mood & affect appropriate.     Data Reviewed: I have personally reviewed following labs and imaging studies  CBC: Recent Labs  Lab 08/03/19 2220 08/04/19 0805 08/05/19 0155 08/06/19 0439 08/07/19 0259  WBC 12.4* 14.5* 13.3* 9.6 10.6*  HGB 14.1 12.5 13.2 11.2* 9.3*  HCT 44.0 38.2 41.5 34.4* 29.3*  MCV 92.8 91.6 93.5 92.2 91.6  PLT 237 197 186 172 562   Basic Metabolic Panel: Recent Labs  Lab 08/03/19 2220 08/04/19 0805 08/05/19 0155 08/06/19 0439 08/07/19 0259  NA 140 137 133* 132* 135  K 4.3 4.4 4.7 4.6 3.8  CL 101 102 97* 97* 99  CO2 29 25 24 26 22   GLUCOSE  113* 118* 216* 151* 111*  BUN 13 10 15 19 17   CREATININE 0.81 0.72 1.25* 1.13* 0.72  CALCIUM 9.6 9.0 8.9 8.7* 9.4   GFR: Estimated Creatinine Clearance: 50.4 mL/min (by C-G formula based on SCr of 0.72 mg/dL). Liver Function Tests: No results for input(s): AST, ALT, ALKPHOS, BILITOT, PROT, ALBUMIN in the last 168 hours. No results for input(s): LIPASE, AMYLASE in the last 168 hours. No results for input(s): AMMONIA in the last 168 hours. Coagulation Profile: Recent Labs  Lab 08/03/19 2220  INR 1.1   Cardiac Enzymes: No results for input(s): CKTOTAL, CKMB, CKMBINDEX, TROPONINI in the last 168 hours. BNP (last 3 results) No results for input(s): PROBNP in the last 8760 hours.  HbA1C: No results for input(s): HGBA1C in the last 72 hours. CBG: Recent Labs  Lab 08/06/19 0805 08/06/19 1133 08/06/19 1708 08/06/19 2224 08/07/19 0629  GLUCAP 128* 118* 114* 115* 107*   Lipid Profile: No results for input(s): CHOL, HDL, LDLCALC, TRIG, CHOLHDL, LDLDIRECT in the last 72 hours. Thyroid Function Tests: No results for input(s): TSH, T4TOTAL, FREET4, T3FREE, THYROIDAB in the last 72 hours. Anemia Panel: No results for input(s): VITAMINB12, FOLATE, FERRITIN, TIBC, IRON, RETICCTPCT in the last 72 hours. Sepsis Labs: No results for input(s): PROCALCITON, LATICACIDVEN in the last 168 hours.  Recent Results (from the past 240 hour(s))  Urine Culture     Status: Abnormal   Collection Time: 08/01/19 10:03 AM   Specimen: Urine  Result Value Ref Range Status   MICRO NUMBER: [id number]  Final   SPECIMEN QUALITY: Adequate  Final   Sample Source NOT GIVEN  Final   STATUS: FINAL  Final   ISOLATE 1: Klebsiella pneumoniae (A)  Final    Comment: Greater than 100,000 CFU/mL of Klebsiella pneumoniae      Susceptibility   Klebsiella pneumoniae - URINE CULTURE, REFLEX    AMOX/CLAVULANIC <=2 Sensitive     AMPICILLIN 16 Resistant     AMPICILLIN/SULBACTAM <=2 Sensitive     CEFAZOLIN* <=4 Not  Reportable      * For infections other than uncomplicated UTIcaused by E. coli, K. pneumoniae or P. mirabilis:Cefazolin is resistant if MIC > or = 8 mcg/mL.(Distinguishing susceptible versus intermediatefor isolates with MIC < or = 4 mcg/mL requiresadditional testing.)For uncomplicated UTI caused by E. coli,K. pneumoniae or P. mirabilis: Cefazolin issusceptible if MIC <32 mcg/mL and predictssusceptible to the oral agents cefaclor, cefdinir,cefpodoxime, cefprozil, cefuroxime, cephalexinand loracarbef.    CEFEPIME <=1 Sensitive     CEFTRIAXONE <=1 Sensitive     CIPROFLOXACIN <=0.25 Sensitive     LEVOFLOXACIN <=0.12 Sensitive     ERTAPENEM <=0.5 Sensitive     GENTAMICIN <=1 Sensitive     IMIPENEM <=0.25 Sensitive     NITROFURANTOIN 32 Sensitive     PIP/TAZO <=4 Sensitive     TOBRAMYCIN <=1 Sensitive     TRIMETH/SULFA* <=20 Sensitive      * For infections other than uncomplicated UTIcaused by E. coli, K. pneumoniae or P. mirabilis:Cefazolin is resistant if MIC > or = 8 mcg/mL.(Distinguishing susceptible versus intermediatefor isolates with MIC < or = 4 mcg/mL requiresadditional testing.)For uncomplicated UTI caused by E. coli,K. pneumoniae or P. mirabilis: Cefazolin issusceptible if MIC <32 mcg/mL and predictssusceptible to the oral agents cefaclor, cefdinir,cefpodoxime, cefprozil, cefuroxime, cephalexinand loracarbef.Legend:S = Susceptible  I = IntermediateR = Resistant  NS = Not susceptible* = Not tested  NR = Not reported**NN = See antimicrobic comments  Respiratory Panel by RT PCR (Flu A&B, Covid) - Nasopharyngeal Swab     Status: None   Collection Time: 08/03/19 10:16 PM   Specimen: Nasopharyngeal Swab  Result Value Ref Range Status   SARS Coronavirus 2 by RT PCR NEGATIVE NEGATIVE Final    Comment: (NOTE) SARS-CoV-2 target nucleic acids are NOT DETECTED. The SARS-CoV-2 RNA is generally detectable in upper respiratoy specimens during the acute phase of infection. The lowest concentration of  SARS-CoV-2 viral copies this assay can detect is 131 copies/mL. A negative result does not preclude SARS-Cov-2 infection and should not be used as the sole basis for treatment or other patient management decisions. A negative result may occur with  improper specimen collection/handling, submission of specimen other than nasopharyngeal swab, presence of viral mutation(s)  within the areas targeted by this assay, and inadequate number of viral copies (<131 copies/mL). A negative result must be combined with clinical observations, patient history, and epidemiological information. The expected result is Negative. Fact Sheet for Patients:  https://www.moore.com/ Fact Sheet for Healthcare Providers:  https://www.young.biz/ This test is not yet ap proved or cleared by the [place] FDA and  has been authorized for detection and/or diagnosis of SARS-CoV-2 by FDA under an Emergency Use Authorization (EUA). This EUA will remain  in effect (meaning this test can be used) for the duration of the COVID-19 declaration under Section 564(b)(1) of the Act, 21 U.S.C. section 360bbb-3(b)(1), unless the authorization is terminated or revoked sooner.    Influenza A by PCR NEGATIVE NEGATIVE Final   Influenza B by PCR NEGATIVE NEGATIVE Final    Comment: (NOTE) The Xpert Xpress SARS-CoV-2/FLU/RSV assay is intended as an aid in  the diagnosis of influenza from Nasopharyngeal swab specimens and  should not be used as a sole basis for treatment. Nasal washings and  aspirates are unacceptable for Xpert Xpress SARS-CoV-2/FLU/RSV  testing. Fact Sheet for Patients: https://www.moore.com/ Fact Sheet for Healthcare Providers: https://www.young.biz/ This test is not yet approved or cleared by the [place] FDA and  has been authorized for detection and/or diagnosis of SARS-CoV-2 by  FDA under an Emergency Use Authorization (EUA).  This EUA will remain  in effect (meaning this test can be used) for the duration of the  Covid-19 declaration under Section 564(b)(1) of the Act, 21  U.S.C. section 360bbb-3(b)(1), unless the authorization is  terminated or revoked. Performed at [facility], [address] [place], [place] [address]   MRSA PCR Screening     Status: None   Collection Time: 08/04/19  4:07 AM   Specimen: Nasal Mucosa; Nasopharyngeal  Result Value Ref Range Status   MRSA by PCR NEGATIVE NEGATIVE Final    Comment:        The GeneXpert MRSA Assay (FDA approved for NASAL specimens only), is one component of a comprehensive MRSA colonization surveillance program. It is not intended to diagnose MRSA infection nor to guide or monitor treatment for MRSA infections. Performed at [facility], [address], [place], [place] [address]       Radiology Studies: DG CHEST PORT 1 VIEW  Result Date: 08/06/2019 CLINICAL DATA:  Short of breath cough EXAM: PORTABLE CHEST 1 VIEW COMPARISON:  08/03/2019 FINDINGS: Cardiac enlargement without heart failure. Increased density in the left lung base felt to be overlying heart. No evidence of infiltrate or effusion. Atherosclerotic aortic arch. IMPRESSION: Cardiac enlargement without acute abnormality. Electronically Signed   By: Marlan Palau M.D.   On: 08/06/2019 11:39   DG C-Arm 1-60 Min  Result Date: 08/05/2019 CLINICAL DATA:  Left hip replacement EXAM: OPERATIVE LEFT HIP WITH PELVIS; DG C-ARM 1-60 MIN COMPARISON:  None. FLUOROSCOPY TIME:  Radiation Exposure Index (as provided by the fluoroscopic device): Not available If the device does not provide the exposure index: Fluoroscopy Time:  39 seconds Number of Acquired Images:  2 FINDINGS: Left hip hemiarthroplasty is noted in satisfactory position. No acute bony or soft tissue abnormality is seen. IMPRESSION: Status post left hip hemiarthroplasty. Electronically Signed   By: Alcide Clever M.D.   On: 08/05/2019  19:30   ECHOCARDIOGRAM COMPLETE  Result Date: 08/05/2019    ECHOCARDIOGRAM REPORT   Patient Name:   LAMEEKA SCHLEIFER Date of Exam: 08/05/2019 Medical Rec #:  [id number]  Height:       65.0 in Accession #:    [id number]      Weight:       160.0 lb Date of Birth:  1931/02/13       BSA:          1.799 m Patient Age:    88 years        BP:           132/78 mmHg Patient Gender: F               HR:           76 bpm. Exam Location:  Inpatient Procedure: 2D Echo, Cardiac Doppler and Color Doppler Indications:    Congestive heart failure 428.0/I50.9  History:        Patient has prior history of Echocardiogram examinations, most                 recent 08/03/2017. Arrythmias:LBBB and Atrial Fibrillation.                 Non-ischemic cardiomyopathy.  Sonographer:    Ross Ludwig RDCS (AE) Referring Phys: [id number] JESSICA U [facility]  Sonographer Comments: Technically difficult study due to poor echo windows. IMPRESSIONS  1. Left ventricular ejection fraction, by estimation, is 35%. The left ventricle has moderate to severely decreased function. The left ventricle demonstrates global hypokinesis. There is moderate left ventricular hypertrophy. Left ventricular diastolic parameters are indeterminate. There is the interventricular septum is flattened in diastole ('D' shaped left ventricle), consistent with right ventricular volume overload.  2. Right ventricular systolic function is moderately reduced. The right ventricular size is moderately enlarged. There is severely elevated pulmonary artery systolic pressure. The estimated right ventricular systolic pressure is 83.2 mmHg.  3. Left atrial size was mildly dilated.  4. Right atrial size was severely dilated.  5. The mitral valve is normal in structure. No evidence of mitral valve regurgitation. No evidence of mitral stenosis.  6. The tricuspid valve is degenerative. Tricuspid valve regurgitation is severe.  7. The aortic valve is grossly normal. Aortic valve regurgitation is not  visualized. No aortic stenosis is present.  8. The inferior vena cava is dilated in size with <50% respiratory variability, suggesting right atrial pressure of 15 mmHg. Comparison(s): A prior study was performed on 08/03/2017. Prior images reviewed side by side. RV size has increased and systolic function decreased. TR has increased from mild-moderate to severe. LV function appears slightly improved. FINDINGS  Left Ventricle: Left ventricular ejection fraction, by estimation, is 35%. The left ventricle has moderate to severely decreased function. The left ventricle demonstrates global hypokinesis. The left ventricular internal cavity size was normal in size. There is moderate left ventricular hypertrophy. Abnormal (paradoxical) septal motion, consistent with left bundle branch block and the interventricular septum is flattened in diastole ('D' shaped left ventricle), consistent with right ventricular volume overload. Left ventricular diastolic parameters are indeterminate. Right Ventricle: The right ventricular size is moderately enlarged. No increase in right ventricular wall thickness. Right ventricular systolic function is moderately reduced. There is severely elevated pulmonary artery systolic pressure. The tricuspid regurgitant velocity is 4.13 m/s, and with an assumed right atrial pressure of 15 mmHg, the estimated right ventricular systolic pressure is 83.2 mmHg. Left Atrium: Left atrial size was mildly dilated. Right Atrium: Right atrial size was severely dilated. Pericardium: Trivial pericardial effusion is present. Mitral Valve: The mitral valve is normal in structure. Normal mobility of the mitral valve leaflets. No evidence of  mitral valve regurgitation. No evidence of mitral valve stenosis. Tricuspid Valve: The tricuspid valve is degenerative in appearance. Tricuspid valve regurgitation is severe. No evidence of tricuspid stenosis. Aortic Valve: The aortic valve is grossly normal.. There is mild  thickening and mild calcification of the aortic valve. Aortic valve regurgitation is not visualized. No aortic stenosis is present. There is mild thickening of the aortic valve. There is mild calcification of the aortic valve. Pulmonic Valve: The pulmonic valve was normal in structure. Pulmonic valve regurgitation is trivial. No evidence of pulmonic stenosis. Aorta: The aortic root and ascending aorta are structurally normal, with no evidence of dilitation. Venous: The inferior vena cava is dilated in size with less than 50% respiratory variability, suggesting right atrial pressure of 15 mmHg. IAS/Shunts: No atrial level shunt detected by color flow Doppler.  LEFT VENTRICLE PLAX 2D LVIDd:         3.41 cm LVIDs:         2.92 cm LV PW:         1.85 cm LV IVS:        1.40 cm LVOT diam:     1.90 cm LV SV:         24 LV SV Index:   14 LVOT Area:     2.84 cm  LV Volumes (MOD) LV vol d, MOD A2C: 56.2 ml LV vol d, MOD A4C: 87.7 ml LV vol s, MOD A2C: 40.4 ml LV vol s, MOD A4C: 59.2 ml LV SV MOD A2C:     15.8 ml LV SV MOD A4C:     87.7 ml LV SV MOD BP:      23.5 ml RIGHT VENTRICLE            IVC RV Basal diam:  4.46 cm    IVC diam: 2.31 cm RV Mid diam:    4.36 cm RV S prime:     6.46 cm/s TAPSE (M-mode): 1.0 cm LEFT ATRIUM           Index       RIGHT ATRIUM           Index LA diam:      4.00 cm 2.22 cm/m  RA Area:     26.10 cm LA Vol (A4C): 55.5 ml 30.85 ml/m RA Volume:   83.30 ml  46.30 ml/m  AORTIC VALVE LVOT Vmax:   55.92 cm/s LVOT Vmean:  38.420 cm/s LVOT VTI:    0.086 m  AORTA Ao Root diam: 3.50 cm Ao Asc diam:  3.60 cm TRICUSPID VALVE TR Peak grad:   68.2 mmHg TR Vmax:        413.00 cm/s  SHUNTS Systemic VTI:  0.09 m Systemic Diam: 1.90 cm Weston Brass MD Electronically signed by Weston Brass MD Signature Date/Time: 08/05/2019/5:02:38 PM    Final    DG HIP OPERATIVE UNILAT W OR W/O PELVIS LEFT  Result Date: 08/05/2019 CLINICAL DATA:  Left hip replacement EXAM: OPERATIVE LEFT HIP WITH PELVIS; DG C-ARM 1-60  MIN COMPARISON:  None. FLUOROSCOPY TIME:  Radiation Exposure Index (as provided by the fluoroscopic device): Not available If the device does not provide the exposure index: Fluoroscopy Time:  39 seconds Number of Acquired Images:  2 FINDINGS: Left hip hemiarthroplasty is noted in satisfactory position. No acute bony or soft tissue abnormality is seen. IMPRESSION: Status post left hip hemiarthroplasty. Electronically Signed   By: Alcide Clever M.D.   On: 08/05/2019 19:30    Scheduled Meds:  acetaminophen  1,000 mg Oral TID   amiodarone  200 mg Oral Daily   apixaban  2.5 mg Oral BID   cephALEXin  250 mg Oral BID   Chlorhexidine Gluconate Cloth  6 each Topical Daily   digoxin  0.125 mg Oral Daily   ferrous sulfate  325 mg Oral Q breakfast   insulin aspart  0-15 Units Subcutaneous TID WC   insulin aspart  0-5 Units Subcutaneous QHS   metoprolol tartrate  25 mg Oral BID   senna  1 tablet Oral BID   sertraline  100 mg Oral Daily   Continuous Infusions:  sodium chloride 50 mL/hr at 08/06/19 2213   methocarbamol (ROBAXIN) IV       LOS: 3 days   Time spent: 30 minutes   Hughie Closs, MD [facility]  08/07/2019, 10:09 AM   To contact the attending provider between 7A-7P or the covering provider during after hours 7P-7A, please log into the web site [email or web address].

## 2019-08-07 NOTE — Progress Notes (Addendum)
Subjective: 2 Days Post-Op Procedure(s) (LRB): ANTERIOR APPROACH HEMI HIP ARTHROPLASTY (Left) Patient reports pain as moderate.  Feels generally weak.  Was up to the bathroom yesterday evening and felt significantly weak and was tachycardic.  Was then transferred to the progressive unit.  This morning she still states "I feel weak all over "no frank dizziness.  She does state that tramadol makes her funny headed.  She has moderate left hip pain.  Patient in bed with husband at bedside.  Objective: Vital signs in last 24 hours: Temp:  [98 F (36.7 C)-99.1 F (37.3 C)] 98 F (36.7 C) (04/28 0733) Pulse Rate:  [45-145] 75 (04/28 0733) Resp:  [16-24] 19 (04/28 0733) BP: (100-150)/(62-119) 103/62 (04/28 0733) SpO2:  [90 %-96 %] 93 % (04/28 0733)  Intake/Output from previous day: 04/27 0701 - 04/28 0700 In: 720 [P.O.:720] Out: -  Intake/Output this shift: No intake/output data recorded.  Recent Labs    08/04/19 0805 08/05/19 0155 08/06/19 0439 08/07/19 0259  HGB 12.5 13.2 11.2* 9.3*   Recent Labs    08/06/19 0439 08/07/19 0259  WBC 9.6 10.6*  RBC 3.73* 3.20*  HCT 34.4* 29.3*  PLT 172 180   Recent Labs    08/06/19 0439 08/07/19 0259  NA 132* 135  K 4.6 3.8  CL 97* 99  CO2 26 22  BUN 19 17  CREATININE 1.13* 0.72  GLUCOSE 151* 111*  CALCIUM 8.7* 9.4   No results for input(s): LABPT, INR in the last 72 hours.  The patient is alert and oriented this morning. Left hip exam: Dressing benign.  Minimal left hip/thigh swelling.  Her calf is soft and nontender.  Neurovascular status is intact to the left lower extremity.  Minimal pain with range of motion of the left hip.   Assessment/Plan: 2 Days Post-Op Procedure(s) (LRB): ANTERIOR APPROACH HEMI HIP ARTHROPLASTY (Left) Tachycardia.  Managed by hospitalist/cardiology. Postop blood loss anemia.  Expected. Plan: Continue PT weightbearing as tolerated on the left without hip precautions. Continue Eliquis 2.5 mg twice  daily which was her preop dose. Would minimize use of tramadol and try to use Tylenol only for pain. At this point most likely will need a short course of rehab at a skilled nursing facility.  I think the family is okay with this plan.  Will need social work consult for this. We will start ferrous sulfate 325 mg daily. Consider CIR consult.   Matthew Folks 08/07/2019, 7:50 AM

## 2019-08-07 NOTE — Plan of Care (Signed)
Continue to monitor

## 2019-08-07 NOTE — Progress Notes (Signed)
Inpatient Rehab Admissions:  Inpatient Rehab Consult received.  I met with patient, as well as her husband and daughter, at the bedside for rehabilitation assessment and to discuss goals and expectations of an inpatient rehab admission.  They are all very hopeful for CIR.  Pt was very active prior to admission and hopeful to return to this level with intensive rehab.  Feel that she is an excellent candidate with her decline in mobility and need for close medical management of her cardiac problems.  Will open insurance as soon as OT has a chance to document today.   Signed: Shann Medal, PT, DPT Admissions Coordinator [phone] 08/07/19  12:17 PM

## 2019-08-07 NOTE — Consult Note (Signed)
WOC Nurse Consult Note: Reason for Consult: Partial thickness skin loss to left buttock. Patient states that she was unable to move well/lift herself off of the sheets and dragged her bottom, tearing the skin. Wound type: Trauma Pressure Injury POA: N/A Measurement: 3.2cm x 2.1cm x 0.1cm Wound HKI:SNGX, moist with rolled epidermis from 6-8 o'clock. Drainage (amount, consistency, odor) scant serous Periwound:intact Dressing procedure/placement/frequency: I will add an antimicrobial nonadherent gauze as a wound contact layer to the dressing already in place (silicone foam). Patient is increasing mobility, so a pressure redistribution chair cushion is provided for her comfort. With topical care and pressure redistribution, this area should reepithelialize in <1 month.  WOC nursing team will not follow, but will remain available to this patient, the nursing and medical teams.  Please re-consult if needed. Thanks, Ladona Mow, MSN, RN, GNP, [place]  Pager# [phone]

## 2019-08-07 NOTE — Evaluation (Signed)
Occupational Therapy Evaluation Patient Details Name: [name] MRN: [id number] DOB: Feb 21, 1931 Today's Date: 08/07/2019    History of Present Illness [name] is an 86 y.o. female past medical history significant for nonischemic cardiomyopathy, CHF with an EF of 20 to 25% 2019, A. fib on Eliquis, irritable bowel syndrome, depression, L TKA admitted April 25 after a mechanical fall with left hip fracture.    Patient underwent left hemiarthroplasty by direct anterior approach on 08/05/2019   Clinical Impression   PTA pt living with spouse, independent for functional mobility with occasional "dizzy spells". At time of eval, husband and daughter present and supportive. Pt completed bed mobility at min-mod A to sequence BLEs and assist at trunk. Pt then completed sit <> stand with mod A, but required max A to stand pivot. Pt has difficulty advancing LLE and with buckling. Multimodal cues needed to increase safety. Pt with varying levels of orientation throughout session, needing frequent cues to current situation/place/time. Recommend CIR at d/c to continue to progress safe BADL prior returning home and to reduce caregiver burden. Will continue to follow per POC listed below.    Follow Up Recommendations  CIR;Supervision/Assistance - 24 hour    Equipment Recommendations  3 in 1 bedside commode    Recommendations for Other Services       Precautions / Restrictions Precautions Precautions: Fall Restrictions Weight Bearing Restrictions: Yes LLE Weight Bearing: Weight bearing as tolerated      Mobility Bed Mobility Overal bed mobility: Needs Assistance Bed Mobility: Supine to Sit     Supine to sit: Mod assist;Min assist     General bed mobility comments: cues to sequence BLEs to EOB, min A at trunk-mod A at BLEs  Transfers Overall transfer level: Needs assistance Equipment used: Rolling walker (2 wheeled) Transfers: Sit to/from [employer] Sit to Stand:  Mod assist Stand pivot transfers: Max assist       General transfer comment: mod A to rise and steady with RW; max A to stand pivot due to knee buckle- requiring max A to immediately pivot and sit in chair    Balance Overall balance assessment: Needs assistance;History of Falls Sitting-balance support: Bilateral upper extremity supported;Feet supported Sitting balance-Leahy Scale: Fair     Standing balance support: Bilateral upper extremity supported Standing balance-Leahy Scale: Poor                             ADL either performed or assessed with clinical judgement   ADL Overall ADL's : Needs assistance/impaired Eating/Feeding: Set up;Sitting   Grooming: Set up;Sitting   Upper Body Bathing: Minimal assistance;Sitting   Lower Body Bathing: Maximal assistance;Sitting/lateral leans;Sit to/from stand   Upper Body Dressing : Set up;Sitting   Lower Body Dressing: Maximal assistance;Sitting/lateral leans;Sit to/from stand   Toilet Transfer: Maximal assistance;Stand-pivot;BSC Toilet Transfer Details (indicate cue type and reason): simulated to recliner. Max A to assist with knee buckle Toileting- Clothing Manipulation and Hygiene: Maximal assistance       Functional mobility during ADLs: Maximal assistance;Cueing for safety(stand pivot only)       Vision Patient Visual Report: No change from baseline       Perception     Praxis      Pertinent Vitals/Pain Pain Assessment: Faces Faces Pain Scale: Hurts even more Pain Location: L hip Pain Descriptors / Indicators: Sore Pain Intervention(s): Limited activity within patient's tolerance;Monitored during session;Repositioned     Hand Dominance  Extremity/Trunk Assessment Upper Extremity Assessment Upper Extremity Assessment: Generalized weakness   Lower Extremity Assessment Lower Extremity Assessment: Defer to PT evaluation       Communication Communication Communication: No difficulties    Cognition Arousal/Alertness: Awake/alert Behavior During Therapy: WFL for tasks assessed/performed Overall Cognitive Status: Impaired/Different from baseline Area of Impairment: Orientation;Memory;Safety/judgement                 Orientation Level: Disoriented to;Time;Situation;Place(periodially throughout session but could catch self or improved with cues)   Memory: Decreased short-term memory;Decreased recall of precautions   Safety/Judgement: Decreased awareness of safety;Decreased awareness of deficits     General Comments: pt periodically forgetting her location, situation, injury and time throughout session. Would state she is at church or she is at home and the therapist is looking around her house. Pt able to catch herself at time or did well with re orientation cues and able to joke about this at end of session   General Comments       Exercises     Shoulder Instructions      Home Living Family/patient expects to be discharged to:: Private residence Living Arrangements: Spouse/significant other Available Help at Discharge: Family;Available 24 hours/day Type of Home: House Home Access: Level entry     Home Layout: Able to live on main level with bedroom/bathroom;Two level     Bathroom Shower/Tub: Occupational psychologist: Standard     Home Equipment: Environmental consultant - 2 wheels;Shower seat          Prior Functioning/Environment Level of Independence: Independent with assistive device(s)        Comments: pt had begun using cane due to having "dizzy spells"        OT Problem List: Decreased strength;Decreased knowledge of use of DME or AE;Decreased knowledge of precautions;Decreased activity tolerance;Decreased cognition;Impaired balance (sitting and/or standing);Decreased safety awareness;Pain      OT Treatment/Interventions: Self-care/ADL training;Therapeutic exercise;Patient/family education;Energy conservation;Balance training;Therapeutic  activities;DME and/or AE instruction;Cognitive remediation/compensation    OT Goals(Current goals can be found in the care plan section) Acute Rehab OT Goals Patient Stated Goal: rehab and then home OT Goal Formulation: With patient Time For Goal Achievement: 08/21/19 Potential to Achieve Goals: Good  OT Frequency: Min 2X/week   Barriers to D/C:            Co-evaluation              AM-PAC OT "6 Clicks" Daily Activity     Outcome Measure Help from another person eating meals?: A Little Help from another person taking care of personal grooming?: A Little Help from another person toileting, which includes using toliet, bedpan, or urinal?: A Lot Help from another person bathing (including washing, rinsing, drying)?: A Lot Help from another person to put on and taking off regular upper body clothing?: A Little Help from another person to put on and taking off regular lower body clothing?: A Lot 6 Click Score: 15   End of Session Equipment Utilized During Treatment: Rolling walker Nurse Communication: Mobility status;Precautions;Weight bearing status;Need for lift equipment  Activity Tolerance: Patient tolerated treatment well Patient left: in chair;with chair alarm set  OT Visit Diagnosis: Unsteadiness on feet (R26.81);Other abnormalities of gait and mobility (R26.89);Other symptoms and signs involving cognitive function;Muscle weakness (generalized) (M62.81);Pain Pain - Right/Left: Left Pain - part of body: Leg;Hip                Time: 2122-4825 OT Time Calculation (min): 34 min Charges:  OT General Charges $OT Visit: 1 Visit OT Evaluation $OT Eval Moderate Complexity: 1 Mod OT Treatments $Self Care/Home Management : 8-22 mins  Dalphine Handing, MSOT, OTR/L Acute Rehabilitation Services [facility] Office Number: [phone] Pager: [phone]  Dalphine Handing 08/07/2019, 4:57 PM

## 2019-08-07 NOTE — Progress Notes (Signed)
Physical Therapy Treatment Patient Details Name: [name] MRN: [id number] DOB: September 13, 1930 Today's Date: 08/07/2019    History of Present Illness [name] is an 86 y.o. female past medical history significant for nonischemic cardiomyopathy, CHF with an EF of 20 to 25% 2019, A. fib on Eliquis, irritable bowel syndrome, depression, L TKA admitted April 25 after a mechanical fall with left hip fracture.    Patient underwent left hemiarthroplasty by direct anterior approach on 08/05/2019    PT Comments    Pt making steady progress. Pt very motivated to return to independence. Continue to recommend CIR.    Follow Up Recommendations  CIR;Supervision/Assistance - 24 hour     Equipment Recommendations  3in1 (PT)    Recommendations for Other Services Rehab consult;OT consult     Precautions / Restrictions Precautions Precautions: Fall Restrictions Weight Bearing Restrictions: Yes LLE Weight Bearing: Weight bearing as tolerated    Mobility  Bed Mobility               General bed mobility comments: Pt sitting EOB  Transfers Overall transfer level: Needs assistance Equipment used: Rolling walker (2 wheeled) Transfers: Sit to/from Stand Sit to Stand: Mod assist         General transfer comment: Assist to bring hips up and for balance  Ambulation/Gait Ambulation/Gait assistance: Mod assist;Min assist Gait Distance (Feet): 12 Feet(12' x 1, 8' x 1) Assistive device: Rolling walker (2 wheeled) Gait Pattern/deviations: Step-to pattern;Decreased stance time - left;Decreased step length - right;Decreased step length - left;Antalgic Gait velocity: decreased Gait velocity interpretation: <1.31 ft/sec, indicative of household ambulator General Gait Details: Assist for balance and support. Pt fatigues quickly and HR 120's with afib   Stairs             Wheelchair Mobility    Modified Rankin (Stroke Patients Only)       Balance Overall balance  assessment: Needs assistance;History of Falls Sitting-balance support: Bilateral upper extremity supported;Feet supported Sitting balance-Leahy Scale: Fair     Standing balance support: Bilateral upper extremity supported Standing balance-Leahy Scale: Poor Standing balance comment: walker and min assist for static standing                            Cognition Arousal/Alertness: Awake/alert Behavior During Therapy: WFL for tasks assessed/performed Overall Cognitive Status: History of cognitive impairments - at baseline Area of Impairment: Memory                     Memory: Decreased short-term memory                Exercises      General Comments General comments (skin integrity, edema, etc.): HR 100's-120's at rest and with activity      Pertinent Vitals/Pain Pain Assessment: Faces Faces Pain Scale: Hurts little more Pain Location: L hip Pain Descriptors / Indicators: Sore Pain Intervention(s): Limited activity within patient's tolerance;Monitored during session;Repositioned    Home Living                      Prior Function            PT Goals (current goals can now be found in the care plan section) Acute Rehab PT Goals Patient Stated Goal: rehab and then home Progress towards PT goals: Progressing toward goals    Frequency    Min 4X/week      PT Plan Current  plan remains appropriate    Co-evaluation              AM-PAC PT "6 Clicks" Mobility   Outcome Measure  Help needed turning from your back to your side while in a flat bed without using bedrails?: A Lot Help needed moving from lying on your back to sitting on the side of a flat bed without using bedrails?: A Lot Help needed moving to and from a bed to a chair (including a wheelchair)?: A Lot Help needed standing up from a chair using your arms (e.g., wheelchair or bedside chair)?: A Lot Help needed to walk in hospital room?: A Lot Help needed climbing 3-5  steps with a railing? : Total 6 Click Score: 11    End of Session Equipment Utilized During Treatment: Gait belt Activity Tolerance: Patient tolerated treatment well Patient left: in chair;with call bell/phone within reach;with family/visitor present Nurse Communication: Mobility status PT Visit Diagnosis: Unsteadiness on feet (R26.81);Repeated falls (R29.6);Difficulty in walking, not elsewhere classified (R26.2);Pain;Muscle weakness (generalized) (M62.81) Pain - Right/Left: Left Pain - part of body: Hip     Time: 0920-0949 PT Time Calculation (min) (ACUTE ONLY): 29 min  Charges:  $Gait Training: 23-37 mins                     Floyd Pager [phone] Office [place] 08/07/2019, 11:59 AM

## 2019-08-07 NOTE — Progress Notes (Addendum)
Progress Note  Patient Name: [name] Date of Encounter: 08/07/2019  Primary Cardiologist: [name], MD   Subjective   Patient feels sore today. She is in afib with labile rates 100-130. Denies CP, palpitations, or sob.   Inpatient Medications    Scheduled Meds: . acetaminophen  1,000 mg Oral TID  . amiodarone  200 mg Oral Daily  . apixaban  2.5 mg Oral BID  . cephALEXin  250 mg Oral BID  . Chlorhexidine Gluconate Cloth  6 each Topical Daily  . digoxin  0.125 mg Oral Daily  . ferrous sulfate  325 mg Oral Q breakfast  . insulin aspart  0-15 Units Subcutaneous TID WC  . insulin aspart  0-5 Units Subcutaneous QHS  . senna  1 tablet Oral BID  . sertraline  100 mg Oral Daily   Continuous Infusions: . sodium chloride 50 mL/hr at 08/06/19 2213  . methocarbamol (ROBAXIN) IV     PRN Meds: acetaminophen, alum & mag hydroxide-simeth, bisacodyl, calcium carbonate, magnesium citrate, methocarbamol **OR** methocarbamol (ROBAXIN) IV, morphine injection, ondansetron **OR** ondansetron (ZOFRAN) IV, polyvinyl alcohol, traMADol   Vital Signs    Vitals:   08/07/19 0425 08/07/19 0430 08/07/19 0630 08/07/19 0733  BP: 125/71   103/62  Pulse: (!) 56 (!) 45 95 75  Resp: (!) 24  20 19   Temp: 98.6 F (37 C)   98 F (36.7 C)  TempSrc: Oral   Oral  SpO2: 95%   93%  Weight:      Height:        Intake/Output Summary (Last 24 hours) at 08/07/2019 0823 Last data filed at 08/06/2019 2050 Gross per 24 hour  Intake 720 ml  Output --  Net 720 ml   Last 3 Weights 08/03/2019 08/01/2019 07/29/2019  Weight (lbs) 160 lb 162 lb 2 oz 161 lb 6.4 oz  Weight (kg) 72.576 kg 73.539 kg 73.211 kg      Telemetry    Afib, 100-120s, PVCs - Personally Reviewed  ECG    No new - Personally Reviewed  Physical Exam   GEN: No acute distress.   Neck: No JVD Cardiac: Irreg Irreg, tachy; no murmurs, rubs, or gallops.  Respiratory: Clear to auscultation bilaterally. GI: Soft, nontender,  non-distended  MS: Trace edema; No deformity. Neuro:  Nonfocal  Psych: Normal affect   Labs    High Sensitivity Troponin:  No results for input(s): TROPONINIHS in the last 720 hours.    Chemistry Recent Labs  Lab 08/05/19 0155 08/06/19 0439 08/07/19 0259  NA 133* 132* 135  K 4.7 4.6 3.8  CL 97* 97* 99  CO2 24 26 22   GLUCOSE 216* 151* 111*  BUN 15 19 17   CREATININE 1.25* 1.13* 0.72  CALCIUM 8.9 8.7* 9.4  GFRNONAA 39* 44* >60  GFRAA 45* 51* >60  ANIONGAP 12 9 14      Hematology Recent Labs  Lab 08/05/19 0155 08/06/19 0439 08/07/19 0259  WBC 13.3* 9.6 10.6*  RBC 4.44 3.73* 3.20*  HGB 13.2 11.2* 9.3*  HCT 41.5 34.4* 29.3*  MCV 93.5 92.2 91.6  MCH 29.7 30.0 29.1  MCHC 31.8 32.6 31.7  RDW 14.3 13.9 14.1  PLT 186 172 180    BNPNo results for input(s): BNP, PROBNP in the last 168 hours.   DDimer No results for input(s): DDIMER in the last 168 hours.   Radiology    DG CHEST PORT 1 VIEW  Result Date: 08/06/2019 CLINICAL DATA:  Short of breath cough EXAM:  PORTABLE CHEST 1 VIEW COMPARISON:  08/03/2019 FINDINGS: Cardiac enlargement without heart failure. Increased density in the left lung base felt to be overlying heart. No evidence of infiltrate or effusion. Atherosclerotic aortic arch. IMPRESSION: Cardiac enlargement without acute abnormality. Electronically Signed   By: Marlan Palau M.D.   On: 08/06/2019 11:39   DG C-Arm 1-60 Min  Result Date: 08/05/2019 CLINICAL DATA:  Left hip replacement EXAM: OPERATIVE LEFT HIP WITH PELVIS; DG C-ARM 1-60 MIN COMPARISON:  None. FLUOROSCOPY TIME:  Radiation Exposure Index (as provided by the fluoroscopic device): Not available If the device does not provide the exposure index: Fluoroscopy Time:  39 seconds Number of Acquired Images:  2 FINDINGS: Left hip hemiarthroplasty is noted in satisfactory position. No acute bony or soft tissue abnormality is seen. IMPRESSION: Status post left hip hemiarthroplasty. Electronically Signed   By:  Alcide Clever M.D.   On: 08/05/2019 19:30   ECHOCARDIOGRAM COMPLETE  Result Date: 08/05/2019    ECHOCARDIOGRAM REPORT   Patient Name:   [name] Date of Exam: 08/05/2019 Medical Rec #:  [id number]       Height:       65.0 in Accession #:    [id number]      Weight:       160.0 lb Date of Birth:  July 13, 1930       BSA:          1.799 m Patient Age:    86 years        BP:           132/78 mmHg Patient Gender: F               HR:           76 bpm. Exam Location:  Inpatient Procedure: 2D Echo, Cardiac Doppler and Color Doppler Indications:    Congestive heart failure 428.0/I50.9  History:        Patient has prior history of Echocardiogram examinations, most                 recent 08/03/2017. Arrythmias:LBBB and Atrial Fibrillation.                 Non-ischemic cardiomyopathy.  Sonographer:    Ross Ludwig RDCS (AE) Referring Phys: [id number] JESSICA U [facility]  Sonographer Comments: Technically difficult study due to poor echo windows. IMPRESSIONS  1. Left ventricular ejection fraction, by estimation, is 35%. The left ventricle has moderate to severely decreased function. The left ventricle demonstrates global hypokinesis. There is moderate left ventricular hypertrophy. Left ventricular diastolic parameters are indeterminate. There is the interventricular septum is flattened in diastole ('D' shaped left ventricle), consistent with right ventricular volume overload.  2. Right ventricular systolic function is moderately reduced. The right ventricular size is moderately enlarged. There is severely elevated pulmonary artery systolic pressure. The estimated right ventricular systolic pressure is 83.2 mmHg.  3. Left atrial size was mildly dilated.  4. Right atrial size was severely dilated.  5. The mitral valve is normal in structure. No evidence of mitral valve regurgitation. No evidence of mitral stenosis.  6. The tricuspid valve is degenerative. Tricuspid valve regurgitation is severe.  7. The aortic valve is grossly normal.  Aortic valve regurgitation is not visualized. No aortic stenosis is present.  8. The inferior vena cava is dilated in size with <50% respiratory variability, suggesting right atrial pressure of 15 mmHg. Comparison(s): A prior study was performed on 08/03/2017. Prior images reviewed side by side. RV size  has increased and systolic function decreased. TR has increased from mild-moderate to severe. LV function appears slightly improved. FINDINGS  Left Ventricle: Left ventricular ejection fraction, by estimation, is 35%. The left ventricle has moderate to severely decreased function. The left ventricle demonstrates global hypokinesis. The left ventricular internal cavity size was normal in size. There is moderate left ventricular hypertrophy. Abnormal (paradoxical) septal motion, consistent with left bundle branch block and the interventricular septum is flattened in diastole ('D' shaped left ventricle), consistent with right ventricular volume overload. Left ventricular diastolic parameters are indeterminate. Right Ventricle: The right ventricular size is moderately enlarged. No increase in right ventricular wall thickness. Right ventricular systolic function is moderately reduced. There is severely elevated pulmonary artery systolic pressure. The tricuspid regurgitant velocity is 4.13 m/s, and with an assumed right atrial pressure of 15 mmHg, the estimated right ventricular systolic pressure is 83.2 mmHg. Left Atrium: Left atrial size was mildly dilated. Right Atrium: Right atrial size was severely dilated. Pericardium: Trivial pericardial effusion is present. Mitral Valve: The mitral valve is normal in structure. Normal mobility of the mitral valve leaflets. No evidence of mitral valve regurgitation. No evidence of mitral valve stenosis. Tricuspid Valve: The tricuspid valve is degenerative in appearance. Tricuspid valve regurgitation is severe. No evidence of tricuspid stenosis. Aortic Valve: The aortic valve is  grossly normal.. There is mild thickening and mild calcification of the aortic valve. Aortic valve regurgitation is not visualized. No aortic stenosis is present. There is mild thickening of the aortic valve. There is mild calcification of the aortic valve. Pulmonic Valve: The pulmonic valve was normal in structure. Pulmonic valve regurgitation is trivial. No evidence of pulmonic stenosis. Aorta: The aortic root and ascending aorta are structurally normal, with no evidence of dilitation. Venous: The inferior vena cava is dilated in size with less than 50% respiratory variability, suggesting right atrial pressure of 15 mmHg. IAS/Shunts: No atrial level shunt detected by color flow Doppler.  LEFT VENTRICLE PLAX 2D LVIDd:         3.41 cm LVIDs:         2.92 cm LV PW:         1.85 cm LV IVS:        1.40 cm LVOT diam:     1.90 cm LV SV:         24 LV SV Index:   14 LVOT Area:     2.84 cm  LV Volumes (MOD) LV vol d, MOD A2C: 56.2 ml LV vol d, MOD A4C: 87.7 ml LV vol s, MOD A2C: 40.4 ml LV vol s, MOD A4C: 59.2 ml LV SV MOD A2C:     15.8 ml LV SV MOD A4C:     87.7 ml LV SV MOD BP:      23.5 ml RIGHT VENTRICLE            IVC RV Basal diam:  4.46 cm    IVC diam: 2.31 cm RV Mid diam:    4.36 cm RV S prime:     6.46 cm/s TAPSE (M-mode): 1.0 cm LEFT ATRIUM           Index       RIGHT ATRIUM           Index LA diam:      4.00 cm 2.22 cm/m  RA Area:     26.10 cm LA Vol (A4C): 55.5 ml 30.85 ml/m RA Volume:   83.30 ml  46.30 ml/m  AORTIC VALVE LVOT  Vmax:   55.92 cm/s LVOT Vmean:  38.420 cm/s LVOT VTI:    0.086 m  AORTA Ao Root diam: 3.50 cm Ao Asc diam:  3.60 cm TRICUSPID VALVE TR Peak grad:   68.2 mmHg TR Vmax:        413.00 cm/s  SHUNTS Systemic VTI:  0.09 m Systemic Diam: 1.90 cm Weston Brass MD Electronically signed by Weston Brass MD Signature Date/Time: 08/05/2019/5:02:38 PM    Final    DG HIP OPERATIVE UNILAT W OR W/O PELVIS LEFT  Result Date: 08/05/2019 CLINICAL DATA:  Left hip replacement EXAM: OPERATIVE LEFT  HIP WITH PELVIS; DG C-ARM 1-60 MIN COMPARISON:  None. FLUOROSCOPY TIME:  Radiation Exposure Index (as provided by the fluoroscopic device): Not available If the device does not provide the exposure index: Fluoroscopy Time:  39 seconds Number of Acquired Images:  2 FINDINGS: Left hip hemiarthroplasty is noted in satisfactory position. No acute bony or soft tissue abnormality is seen. IMPRESSION: Status post left hip hemiarthroplasty. Electronically Signed   By: Alcide Clever M.D.   On: 08/05/2019 19:30    Cardiac Studies   Echo 08/05/19 1. Left ventricular ejection fraction, by estimation, is 35%. The left  ventricle has moderate to severely decreased function. The left ventricle  demonstrates global hypokinesis. There is moderate left ventricular  hypertrophy. Left ventricular diastolic  parameters are indeterminate. There is the interventricular septum is  flattened in diastole ('D' shaped left ventricle), consistent with right  ventricular volume overload.  2. Right ventricular systolic function is moderately reduced. The right  ventricular size is moderately enlarged. There is severely elevated  pulmonary artery systolic pressure. The estimated right ventricular  systolic pressure is 83.2 mmHg.  3. Left atrial size was mildly dilated.  4. Right atrial size was severely dilated.  5. The mitral valve is normal in structure. No evidence of mitral valve  regurgitation. No evidence of mitral stenosis.  6. The tricuspid valve is degenerative. Tricuspid valve regurgitation is  severe.  7. The aortic valve is grossly normal. Aortic valve regurgitation is not  visualized. No aortic stenosis is present.  8. The inferior vena cava is dilated in size with <50% respiratory  variability, suggesting right atrial pressure of 15 mmHg.   Patient Profile     86 y.o. female with hx of atrial fibrillation and chronic systolic HF who is being seen for preoperative cardiac evaluation.  Assessment &  Plan    Permanent Afib - continue amiodarone and dig - Metoprolol 25 mg BID was added back during admission, however discontinued 4/26, unsure why, possibly hypotension. Would add this back for elevated HR - check Dig level - Eliquis 2.5mg  BID  - Patient is asymptomatic  Chronic systolic HF - Echo this admission showed EF 35%, in comparison to prior in 2019 RV size increased and systolic function decreased, TR increased from mod to severe. EF minimally improved. - Trace edema on exam>>cautious with IVF  S/P fall w/ R hip fx and repair - followed by orthopedics - Plan for CIR at discharge  For questions or updates, please contact [facility] Please consult [email or web address] for contact info under        Signed, Cadence David Stall, PA-C  08/07/2019, 8:23 AM    I have seen and examined the patient along with Cadence David Stall, PA-C .  I have reviewed the chart, notes and new data.  I agree with PA/NP's note.  Key new complaints: Feels a little weak, but denies frank dyspnea.  Key examination changes: Ventricular rates 120-130 with a minimal movement, no overt signs of hypervolemia (no edema, clear lungs), prominent jugular V waves due to severe TR. Key new findings / data: Hemoglobin 9.3 (from preop 14.1 and yesterday 11.2).  PLAN: Suspect the weakness is due to anemia and postop state rather than the beta-blocker.  Important to restart metoprolol for atrial fib ventricular rate control. If hemoglobin drops further, might consider transfusion.  Sanda Klein, MD, [place] [phone] 08/07/2019, 10:22 AM

## 2019-08-08 DIAGNOSIS — S72002A Fracture of unspecified part of neck of left femur, initial encounter for closed fracture: Secondary | ICD-10-CM | POA: Diagnosis not present

## 2019-08-08 LAB — BASIC METABOLIC PANEL
Anion gap: 9 (ref 5–15)
BUN: 13 mg/dL (ref 8–23)
CO2: 27 mmol/L (ref 22–32)
Calcium: 8.8 mg/dL — ABNORMAL LOW (ref 8.9–10.3)
Chloride: 99 mmol/L (ref 98–111)
Creatinine, Ser: 0.76 mg/dL (ref 0.44–1.00)
GFR calc Af Amer: >60 mL/min (ref >60)
GFR calc non Af Amer: >60 mL/min (ref >60)
Glucose, Bld: 108 mg/dL — ABNORMAL HIGH (ref 70–99)
Potassium: 3.8 mmol/L (ref 3.5–5.1)
Sodium: 135 mmol/L (ref 135–145)

## 2019-08-08 LAB — CBC
HCT: 30.1 % — ABNORMAL LOW (ref 36.0–46.0)
Hemoglobin: 9.9 g/dL — ABNORMAL LOW (ref 12.0–15.0)
MCH: 30.6 pg (ref 26.0–34.0)
MCHC: 32.9 g/dL (ref 30.0–36.0)
MCV: 92.9 fL (ref 80.0–100.0)
Platelets: 227 10*3/uL (ref 150–400)
RBC: 3.24 MIL/uL — ABNORMAL LOW (ref 3.87–5.11)
RDW: 14.3 % (ref 11.5–15.5)
WBC: 12.3 10*3/uL — ABNORMAL HIGH (ref 4.0–10.5)
nRBC: 0 % (ref 0.0–0.2)

## 2019-08-08 LAB — GLUCOSE, CAPILLARY
Glucose-Capillary: 94 mg/dL (ref 70–99)
Glucose-Capillary: 97 mg/dL (ref 70–99)
Glucose-Capillary: 134 mg/dL — ABNORMAL HIGH (ref 70–99)

## 2019-08-08 MED ORDER — LOPERAMIDE HCL 2 MG PO CAPS
2.0000 mg | ORAL_CAPSULE | ORAL | Status: DC | PRN
  Administered 2019-08-08 – 2019-08-09 (×3): 2 mg via ORAL
  Filled 2019-08-08 (×4): qty 1

## 2019-08-08 NOTE — Progress Notes (Signed)
Subjective: 3 Days Post-Op Procedure(s) (LRB): ANTERIOR APPROACH HEMI HIP ARTHROPLASTY (Left) Patient reports pain as mild.  Slow progress with physical therapy.  Still feels "tired ".  No chest pain or shortness of breath.  We appreciate cardiology assistance with her care.  Objective: Vital signs in last 24 hours: Temp:  [98.2 F (36.8 C)-99.7 F (37.6 C)] 98.7 F (37.1 C) (04/29 0753) Pulse Rate:  [78-108] 89 (04/29 0753) Resp:  [16-28] 16 (04/29 0753) BP: (124-141)/(65-76) 138/76 (04/29 0753) SpO2:  [90 %-100 %] 100 % (04/29 0753) Weight:  [75.1 kg] 75.1 kg (04/29 0433)  Intake/Output from previous day: 04/28 0701 - 04/29 0700 In: -  Out: 2 [Urine:1; Stool:1] Intake/Output this shift: No intake/output data recorded.  Recent Labs    08/06/19 0439 08/07/19 0259 08/08/19 0156  HGB 11.2* 9.3* 9.9*   Recent Labs    08/07/19 0259 08/08/19 0156  WBC 10.6* 12.3*  RBC 3.20* 3.24*  HCT 29.3* 30.1*  PLT 180 227   Recent Labs    08/07/19 0259 08/08/19 0156  NA 135 135  K 3.8 3.8  CL 99 99  CO2 22 27  BUN 17 13  CREATININE 0.72 0.76  GLUCOSE 111* 108*  CALCIUM 9.4 8.8*   No results for input(s): LABPT, INR in the last 72 hours.  Patient is alert and oriented this morning. Left hip exam: Aqua cell dressing clean and dry.  Mild thigh swelling.  Not significant.  Compartments all soft.  Calf is soft and nontender.  Neurovascular status intact to left lower extremity.   Assessment/Plan: 3 Days Post-Op Procedure(s) (LRB): ANTERIOR APPROACH HEMI HIP ARTHROPLASTY (Left) Slow progress with physical therapy. Acute blood loss anemia, stable.  Overall asymptomatic. Plan: Weight-bear as tolerated on the left without hip precautions. Preoperative Eliquis dose for her cardiac issues/DVT prophylaxis. Hopefully will be a candidate for CIR. Continue daily physical therapy.  Matthew Folks 08/08/2019, 12:01 PM

## 2019-08-08 NOTE — Progress Notes (Signed)
Physical Therapy Treatment Patient Details Name: [name] MRN: [id number] DOB: May 04, 1930 Today's Date: 08/08/2019    History of Present Illness [name] is an 86 y.o. female past medical history significant for nonischemic cardiomyopathy, CHF with an EF of 20 to 25% 2019, A. fib on Eliquis, irritable bowel syndrome, depression, L TKA admitted April 25 after a mechanical fall with left hip fracture.    Patient underwent left hemiarthroplasty by direct anterior approach on 08/05/2019    PT Comments    Pt making steady progress. Continue to feel she is excellent CIR candidate. Pt today with some instability of her lt TKR which she says is chronic and that it "pops out" several times a day.   Follow Up Recommendations  CIR;Supervision/Assistance - 24 hour     Equipment Recommendations  3in1 (PT)    Recommendations for Other Services Rehab consult;OT consult     Precautions / Restrictions Precautions Precautions: Fall Restrictions Weight Bearing Restrictions: Yes LLE Weight Bearing: Weight bearing as tolerated    Mobility  Bed Mobility Overal bed mobility: Needs Assistance Bed Mobility: Supine to Sit     Supine to sit: Min assist     General bed mobility comments: Assist to bring LLE off of bed and elevate trunk into sitting.   Transfers Overall transfer level: Needs assistance Equipment used: Rolling walker (2 wheeled) Transfers: Sit to/from Stand Sit to Stand: Mod assist         General transfer comment: Assist to bring hips up and for balance  Ambulation/Gait Ambulation/Gait assistance: Mod assist;Min assist Gait Distance (Feet): 25 Feet(12' x 1, 5' x 1, 25' x 1) Assistive device: Rolling walker (2 wheeled) Gait Pattern/deviations: Step-to pattern;Decreased stance time - left;Decreased step length - right;Decreased step length - left;Antalgic;Step-through pattern;Trunk flexed Gait velocity: decreased Gait velocity interpretation: <1.31 ft/sec,  indicative of household ambulator General Gait Details: Assist for balance and support. As pt coming up from commode she reports he lt knee is "out of joint" and instability required mod assist. Pt needed lt knee to "pop" and then stability improved. Pt reports this is not new occurence and happens several times a day.    Stairs             Wheelchair Mobility    Modified Rankin (Stroke Patients Only)       Balance Overall balance assessment: Needs assistance;History of Falls Sitting-balance support: Bilateral upper extremity supported;Feet supported Sitting balance-Leahy Scale: Fair     Standing balance support: Bilateral upper extremity supported Standing balance-Leahy Scale: Poor Standing balance comment: walker and min assist for static standing                            Cognition Arousal/Alertness: Awake/alert Behavior During Therapy: WFL for tasks assessed/performed Overall Cognitive Status: History of cognitive impairments - at baseline Area of Impairment: Memory                     Memory: Decreased short-term memory                Exercises      General Comments General comments (skin integrity, edema, etc.): HR 80-90's       Pertinent Vitals/Pain Pain Assessment: Faces Faces Pain Scale: Hurts little more Pain Location: L hip Pain Descriptors / Indicators: Sore Pain Intervention(s): Limited activity within patient's tolerance;Monitored during session    Home Living Family/patient expects to be discharged to:: Private  residence Living Arrangements: Spouse/significant other                  Prior Function            PT Goals (current goals can now be found in the care plan section) Acute Rehab PT Goals Patient Stated Goal: rehab and then home Progress towards PT goals: Progressing toward goals    Frequency    Min 4X/week      PT Plan Current plan remains appropriate    Co-evaluation               AM-PAC PT "6 Clicks" Mobility   Outcome Measure  Help needed turning from your back to your side while in a flat bed without using bedrails?: A Lot Help needed moving from lying on your back to sitting on the side of a flat bed without using bedrails?: A Little Help needed moving to and from a bed to a chair (including a wheelchair)?: A Lot Help needed standing up from a chair using your arms (e.g., wheelchair or bedside chair)?: A Lot Help needed to walk in hospital room?: A Lot Help needed climbing 3-5 steps with a railing? : Total 6 Click Score: 12    End of Session Equipment Utilized During Treatment: Gait belt Activity Tolerance: Patient tolerated treatment well Patient left: in chair;with call bell/phone within reach;with family/visitor present;with chair alarm set Nurse Communication: Mobility status PT Visit Diagnosis: Unsteadiness on feet (R26.81);Repeated falls (R29.6);Difficulty in walking, not elsewhere classified (R26.2);Pain;Muscle weakness (generalized) (M62.81) Pain - Right/Left: Left Pain - part of body: Hip     Time: 1002-1030 PT Time Calculation (min) (ACUTE ONLY): 28 min  Charges:  $Gait Training: 23-37 mins                     [facility] PT Acute Rehabilitation Services Pager [phone] Office [phone]    Angelina Ok [facility] 08/08/2019, 10:42 AM

## 2019-08-08 NOTE — Care Management Important Message (Signed)
Important Message  Patient Details  Name: [name] MRN: [id number] Date of Birth: [birth year]   Medicare Important Message Given:  Yes     Renie Ora 08/08/2019, 10:41 AM

## 2019-08-08 NOTE — Progress Notes (Signed)
PROGRESS NOTE    [name]  WCH:[id number] DOB: [birth year] DOA: 08/03/2019 PCP: [name], MD   Brief Narrative:  [name] is an 86 y.o. female past medical history significant for nonischemic cardiomyopathy, CHF with an EF of 20 to 25% 2019, A. fib on Eliquis, irritable bowel syndrome, depression admitted April 25 after a mechanical fall with left hip fracture.  She was admitted under hospitalist service.  Orthopedics was consulted.  Due to A. fib with rapid heart rate, cardiology was consulted for orthopedic recommendations.  Patient underwent left hemiarthroplasty by orthopedics on 08/05/2019.  Patient then developed A. fib with RVR on the evening of 08/06/2019 and was moved to progressive unit.  Assessment & Plan:   Principal Problem:   Displaced fracture of left femoral neck (HCC) Active Problems:   Nonischemic cardiomyopathy (HCC)   ATRIAL FIBRILLATION   Depression   Acute lower UTI   IBS (irritable bowel syndrome)   Acute kidney injury (HCC)   Severe tricuspid regurgitation  #1.  Left hip fracture.  Mechanical fall.  X-ray reveals fracture of the left femoral neck.  Evaluated by orthopedics and underwent left hemiarthroplasty on 08/05/2019.  Doing fine postoperatively.  Management per orthopedics.  #2.  Acute kidney injury.  Creatinine 1.2> 1.13> 0.72. Resolved.    #3.  Atrial fibrillation with a left bundle branch block.  Was having intermittent elevations of her heart rate at admission.  Home medications include amiodarone and dig.  She was evaluated by cardiology for risk stratification who recommended resuming beta-blocker that had recently been discontinued due to increased fatigue and dizziness.  Still with intermittently elevated heart rate with activity.  Managed by cardiologist on following medications.  Appreciate cardiology help. -Continue beta-blocker -Continue amiodarone -Continue dig -Continue Eliquis. -Monitor  #4.  Combined CHF.  Does not  appear overloaded.  Last echo 2019 with an EF of 20%.  Evaluated by cardiology as noted above.  Cardiology recommends judicious management of IV fluids -Monitor intake and output -Attain daily weights -Medications as noted above  #5.  Acute blood loss anemia.  Hemoglobin dropped from 13.2-11.2> 9.3> 9 today.  Postoperative anemia.  No signs of active bleeding.  No indication of transfusion.  Continue to monitor daily.  #6.  Recent urinary tract infection.  Antibiotics started April 22.  Patient is afebrile hemodynamically stable and nontoxic-appearing -Completed Keflex on 08/07/2019.  DVT prophylaxis: Eliquis   Code Status: DNR  Family Communication: Husband present at bedside.  Plan of care discussed with patient in length and he verbalized understanding and agreed with it. Patient is from: Home Disposition Plan: PT recommends CIR. Barriers to discharge: Pending insurance authorization for discharge to CIR  Status is: Inpatient  Remains inpatient appropriate because:Unsafe d/c plan   Dispo: The patient is from: Home              Anticipated d/c is to: CIR              Anticipated d/c date is: 1 day              Patient currently is not medically stable to d/c.         Estimated body mass index is 27.55 kg/m as calculated from the following:   Height as of this encounter: 5\' 5"  (1.651 m).   Weight as of this encounter: 75.1 kg.      Nutritional status:  Consultants:   Orthopedics  Procedures:   Left hemiarthroplasty 08/05/2019  Antimicrobials:  Anti-infectives (From admission, onward)   Start     Dose/Rate Route Frequency Ordered Stop   08/05/19 2230  ceFAZolin (ANCEF) IVPB 2g/100 mL premix     2 g 200 mL/hr over 30 Minutes Intravenous Every 6 hours 08/05/19 2042 08/06/19 0428   08/05/19 0800  ceFAZolin (ANCEF) IVPB 2g/100 mL premix     2 g 200 mL/hr over 30 Minutes Intravenous To [facility] 08/04/19 2229 08/05/19 1713    08/04/19 0200  cephALEXin (KEFLEX) capsule 250 mg     250 mg Oral 2 times daily 08/04/19 0127 08/07/19 2359         Subjective: Patient seen and examined.  Overall feels weak but has no specific complaint.  Husband at the bedside.  She denies any chest pain, shortness of breath or palpitation.  Objective: Vitals:   08/08/19 0430 08/08/19 0433 08/08/19 0522 08/08/19 0753  BP: 124/72 124/72  138/76  Pulse: 90 78  89  Resp: (!) 25 (!) 26 19 16   Temp: 99.7 F (37.6 C)   98.7 F (37.1 C)  TempSrc: Oral   Oral  SpO2: 90% 90% 98% 100%  Weight:  75.1 kg    Height:       No intake or output data in the 24 hours ending 08/08/19 1316 Filed Weights   08/03/19 2244 08/08/19 0433  Weight: 72.6 kg 75.1 kg    Examination:  General exam: Appears calm and comfortable  Respiratory system: Clear to auscultation. Respiratory effort normal. Cardiovascular system: S1 & S2 heard, irregularly irregular rate and rhythm. No JVD, murmurs, rubs, gallops or clicks. No pedal edema. Gastrointestinal system: Abdomen is nondistended, soft and nontender. No organomegaly or masses felt. Normal bowel sounds heard. Central nervous system: Alert and oriented. No focal neurological deficits. Skin: No rashes, lesions or ulcers.  Psychiatry: Judgement and insight appear normal. Mood & affect appropriate.   Data Reviewed: I have personally reviewed following labs and imaging studies  CBC: Recent Labs  Lab 08/04/19 0805 08/05/19 0155 08/06/19 0439 08/07/19 0259 08/08/19 0156  WBC 14.5* 13.3* 9.6 10.6* 12.3*  HGB 12.5 13.2 11.2* 9.3* 9.9*  HCT 38.2 41.5 34.4* 29.3* 30.1*  MCV 91.6 93.5 92.2 91.6 92.9  PLT 197 186 172 180 295   Basic Metabolic Panel: Recent Labs  Lab 08/04/19 0805 08/05/19 0155 08/06/19 0439 08/07/19 0259 08/08/19 0156  NA 137 133* 132* 135 135  K 4.4 4.7 4.6 3.8 3.8  CL 102 97* 97* 99 99  CO2 25 24 26 22 27   GLUCOSE 118* 216* 151* 111* 108*  BUN 10 15 19 17 13   CREATININE  0.72 1.25* 1.13* 0.72 0.76  CALCIUM 9.0 8.9 8.7* 9.4 8.8*   GFR: Estimated Creatinine Clearance: 51.2 mL/min (by C-G formula based on SCr of 0.76 mg/dL). Liver Function Tests: No results for input(s): AST, ALT, ALKPHOS, BILITOT, PROT, ALBUMIN in the last 168 hours. No results for input(s): LIPASE, AMYLASE in the last 168 hours. No results for input(s): AMMONIA in the last 168 hours. Coagulation Profile: Recent Labs  Lab 08/03/19 2220  INR 1.1   Cardiac Enzymes: No results for input(s): CKTOTAL, CKMB, CKMBINDEX, TROPONINI in the last 168 hours. BNP (last 3 results) No results for input(s): PROBNP in the last 8760 hours. HbA1C: No results for input(s): HGBA1C in the last 72 hours. CBG: Recent Labs  Lab 08/07/19 1553 08/07/19 2109 08/07/19 2156 08/08/19 0605 08/08/19 1144  GLUCAP 113* 119* 106* 94 97   Lipid Profile: No results for input(s): CHOL, HDL, LDLCALC, TRIG, CHOLHDL, LDLDIRECT in the last 72 hours. Thyroid Function Tests: No results for input(s): TSH, T4TOTAL, FREET4, T3FREE, THYROIDAB in the last 72 hours. Anemia Panel: No results for input(s): VITAMINB12, FOLATE, FERRITIN, TIBC, IRON, RETICCTPCT in the last 72 hours. Sepsis Labs: No results for input(s): PROCALCITON, LATICACIDVEN in the last 168 hours.  Recent Results (from the past 240 hour(s))  Urine Culture     Status: Abnormal   Collection Time: 08/01/19 10:03 AM   Specimen: Urine  Result Value Ref Range Status   MICRO NUMBER: [id number]  Final   SPECIMEN QUALITY: Adequate  Final   Sample Source NOT GIVEN  Final   STATUS: FINAL  Final   ISOLATE 1: Klebsiella pneumoniae (A)  Final    Comment: Greater than 100,000 CFU/mL of Klebsiella pneumoniae      Susceptibility   Klebsiella pneumoniae - URINE CULTURE, REFLEX    AMOX/CLAVULANIC <=2 Sensitive     AMPICILLIN 16 Resistant     AMPICILLIN/SULBACTAM <=2 Sensitive     CEFAZOLIN* <=4 Not Reportable      * For infections other than uncomplicated UTIcaused by  E. coli, K. pneumoniae or P. mirabilis:Cefazolin is resistant if MIC > or = 8 mcg/mL.(Distinguishing susceptible versus intermediatefor isolates with MIC < or = 4 mcg/mL requiresadditional testing.)For uncomplicated UTI caused by E. coli,K. pneumoniae or P. mirabilis: Cefazolin issusceptible if MIC <32 mcg/mL and predictssusceptible to the oral agents cefaclor, cefdinir,cefpodoxime, cefprozil, cefuroxime, cephalexinand loracarbef.    CEFEPIME <=1 Sensitive     CEFTRIAXONE <=1 Sensitive     CIPROFLOXACIN <=0.25 Sensitive     LEVOFLOXACIN <=0.12 Sensitive     ERTAPENEM <=0.5 Sensitive     GENTAMICIN <=1 Sensitive     IMIPENEM <=0.25 Sensitive     NITROFURANTOIN 32 Sensitive     PIP/TAZO <=4 Sensitive     TOBRAMYCIN <=1 Sensitive     TRIMETH/SULFA* <=20 Sensitive      * For infections other than uncomplicated UTIcaused by E. coli, K. pneumoniae or P. mirabilis:Cefazolin is resistant if MIC > or = 8 mcg/mL.(Distinguishing susceptible versus intermediatefor isolates with MIC < or = 4 mcg/mL requiresadditional testing.)For uncomplicated UTI caused by E. coli,K. pneumoniae or P. mirabilis: Cefazolin issusceptible if MIC <32 mcg/mL and predictssusceptible to the oral agents cefaclor, cefdinir,cefpodoxime, cefprozil, cefuroxime, cephalexinand loracarbef.Legend:S = Susceptible  I = IntermediateR = Resistant  NS = Not susceptible* = Not tested  NR = Not reported**NN = See antimicrobic comments  Respiratory Panel by RT PCR (Flu A&B, Covid) - Nasopharyngeal Swab     Status: None   Collection Time: 08/03/19 10:16 PM   Specimen: Nasopharyngeal Swab  Result Value Ref Range Status   SARS Coronavirus 2 by RT PCR NEGATIVE NEGATIVE Final    Comment: (NOTE) SARS-CoV-2 target nucleic acids are NOT DETECTED. The SARS-CoV-2 RNA is generally detectable in upper respiratoy specimens during the acute phase of infection. The lowest concentration of SARS-CoV-2 viral copies this assay can detect is 131 copies/mL. A  negative result does not preclude SARS-Cov-2 infection and should not be used as the sole basis for treatment or other patient management decisions. A negative result may occur with  improper specimen collection/handling, submission of specimen other than nasopharyngeal swab, presence of viral mutation(s) within the areas targeted by this assay, and inadequate number of viral copies (<131 copies/mL). A negative result must be combined with clinical observations, patient history, and  epidemiological information. The expected result is Negative. Fact Sheet for Patients:  https://www.moore.com/ Fact Sheet for Healthcare Providers:  https://www.young.biz/ This test is not yet ap proved or cleared by the [place] FDA and  has been authorized for detection and/or diagnosis of SARS-CoV-2 by FDA under an Emergency Use Authorization (EUA). This EUA will remain  in effect (meaning this test can be used) for the duration of the COVID-19 declaration under Section 564(b)(1) of the Act, 21 U.S.C. section 360bbb-3(b)(1), unless the authorization is terminated or revoked sooner.    Influenza A by PCR NEGATIVE NEGATIVE Final   Influenza B by PCR NEGATIVE NEGATIVE Final    Comment: (NOTE) The Xpert Xpress SARS-CoV-2/FLU/RSV assay is intended as an aid in  the diagnosis of influenza from Nasopharyngeal swab specimens and  should not be used as a sole basis for treatment. Nasal washings and  aspirates are unacceptable for Xpert Xpress SARS-CoV-2/FLU/RSV  testing. Fact Sheet for Patients: https://www.moore.com/ Fact Sheet for Healthcare Providers: https://www.young.biz/ This test is not yet approved or cleared by the [place] FDA and  has been authorized for detection and/or diagnosis of SARS-CoV-2 by  FDA under an Emergency Use Authorization (EUA). This EUA will remain  in effect (meaning this test can be used) for  the duration of the  Covid-19 declaration under Section 564(b)(1) of the Act, 21  U.S.C. section 360bbb-3(b)(1), unless the authorization is  terminated or revoked. Performed at [facility], [address] [place], [place] [address]   MRSA PCR Screening     Status: None   Collection Time: 08/04/19  4:07 AM   Specimen: Nasal Mucosa; Nasopharyngeal  Result Value Ref Range Status   MRSA by PCR NEGATIVE NEGATIVE Final    Comment:        The GeneXpert MRSA Assay (FDA approved for NASAL specimens only), is one component of a comprehensive MRSA colonization surveillance program. It is not intended to diagnose MRSA infection nor to guide or monitor treatment for MRSA infections. Performed at [facility], [address], [place], [place] [address]       Radiology Studies: No results found.  Scheduled Meds: . amiodarone  200 mg Oral Daily  . apixaban  2.5 mg Oral BID  . Chlorhexidine Gluconate Cloth  6 each Topical Daily  . digoxin  0.125 mg Oral Daily  . ferrous sulfate  325 mg Oral Q breakfast  . insulin aspart  0-15 Units Subcutaneous TID WC  . insulin aspart  0-5 Units Subcutaneous QHS  . metoprolol tartrate  25 mg Oral BID  . senna  1 tablet Oral BID  . sertraline  100 mg Oral Daily   Continuous Infusions: . methocarbamol (ROBAXIN) IV       LOS: 4 days   Time spent: 29 minutes   Hughie Closs, MD [facility]  08/08/2019, 1:16 PM   To contact the attending provider between 7A-7P or the covering provider during after hours 7P-7A, please log into the web site [email or web address].

## 2019-08-08 NOTE — Progress Notes (Addendum)
Progress Note  Patient Name: [name] Date of Encounter: 08/08/2019  Primary Cardiologist: [name], MD   Subjective   Patient is feeling tired this morning and hip is sore. She has been working with PT/OT and feels getting up and out of the bed is easier each time. Denies CP, palpitations, sob. Fluid status good. afib rates are better controlled.    Inpatient Medications    Scheduled Meds: . amiodarone  200 mg Oral Daily  . apixaban  2.5 mg Oral BID  . Chlorhexidine Gluconate Cloth  6 each Topical Daily  . digoxin  0.125 mg Oral Daily  . ferrous sulfate  325 mg Oral Q breakfast  . insulin aspart  0-15 Units Subcutaneous TID WC  . insulin aspart  0-5 Units Subcutaneous QHS  . metoprolol tartrate  25 mg Oral BID  . senna  1 tablet Oral BID  . sertraline  100 mg Oral Daily   Continuous Infusions: . methocarbamol (ROBAXIN) IV     PRN Meds: acetaminophen, alum & mag hydroxide-simeth, bisacodyl, calcium carbonate, magnesium citrate, methocarbamol **OR** methocarbamol (ROBAXIN) IV, morphine injection, ondansetron **OR** ondansetron (ZOFRAN) IV, polyvinyl alcohol, traMADol   Vital Signs    Vitals:   08/08/19 0020 08/08/19 0430 08/08/19 0433 08/08/19 0522  BP:  124/72 124/72   Pulse: 92 90 78   Resp:  (!) 25 (!) 26 19  Temp: 98.9 F (37.2 C) 99.7 F (37.6 C)    TempSrc: Oral Oral    SpO2:  90% 90% 98%  Weight:   75.1 kg   Height:        Intake/Output Summary (Last 24 hours) at 08/08/2019 0742 Last data filed at 08/07/2019 1255 Gross per 24 hour  Intake --  Output 2 ml  Net -2 ml   Last 3 Weights 08/08/2019 08/03/2019 08/01/2019  Weight (lbs) 165 lb 9.1 oz 160 lb 162 lb 2 oz  Weight (kg) 75.1 kg 72.576 kg 73.539 kg      Telemetry    Afib , HR 80-90s - Personally Reviewed  ECG    No new - Personally Reviewed  Physical Exam   GEN: No acute distress.   Neck: No JVD Cardiac: Irreg Irreg, no murmurs, rubs, or gallops.  Respiratory: Clear to  auscultation bilaterally. GI: Soft, nontender, non-distended  MS: No edema; No deformity. Neuro:  Nonfocal  Psych: Normal affect   Labs    High Sensitivity Troponin:  No results for input(s): TROPONINIHS in the last 720 hours.    Chemistry Recent Labs  Lab 08/06/19 0439 08/07/19 0259 08/08/19 0156  NA 132* 135 135  K 4.6 3.8 3.8  CL 97* 99 99  CO2 26 22 27   GLUCOSE 151* 111* 108*  BUN 19 17 13   CREATININE 1.13* 0.72 0.76  CALCIUM 8.7* 9.4 8.8*  GFRNONAA 44* >60 >60  GFRAA 51* >60 >60  ANIONGAP 9 14 9      Hematology Recent Labs  Lab 08/06/19 0439 08/07/19 0259 08/08/19 0156  WBC 9.6 10.6* 12.3*  RBC 3.73* 3.20* 3.24*  HGB 11.2* 9.3* 9.9*  HCT 34.4* 29.3* 30.1*  MCV 92.2 91.6 92.9  MCH 30.0 29.1 30.6  MCHC 32.6 31.7 32.9  RDW 13.9 14.1 14.3  PLT 172 180 227    BNPNo results for input(s): BNP, PROBNP in the last 168 hours.   DDimer No results for input(s): DDIMER in the last 168 hours.   Radiology    DG CHEST PORT 1 VIEW  Result Date:  08/06/2019 CLINICAL DATA:  Short of breath cough EXAM: PORTABLE CHEST 1 VIEW COMPARISON:  08/03/2019 FINDINGS: Cardiac enlargement without heart failure. Increased density in the left lung base felt to be overlying heart. No evidence of infiltrate or effusion. Atherosclerotic aortic arch. IMPRESSION: Cardiac enlargement without acute abnormality. Electronically Signed   By: Franchot Gallo M.D.   On: 08/06/2019 11:39    Cardiac Studies   Echo 08/05/19 1. Left ventricular ejection fraction, by estimation, is 35%. The left  ventricle has moderate to severely decreased function. The left ventricle  demonstrates global hypokinesis. There is moderate left ventricular  hypertrophy. Left ventricular diastolic  parameters are indeterminate. There is the interventricular septum is  flattened in diastole ('D' shaped left ventricle), consistent with right  ventricular volume overload.   2. Right ventricular systolic function is  moderately reduced. The right  ventricular size is moderately enlarged. There is severely elevated  pulmonary artery systolic pressure. The estimated right ventricular  systolic pressure is 47.8 mmHg.   3. Left atrial size was mildly dilated.   4. Right atrial size was severely dilated.   5. The mitral valve is normal in structure. No evidence of mitral valve  regurgitation. No evidence of mitral stenosis.   6. The tricuspid valve is degenerative. Tricuspid valve regurgitation is  severe.   7. The aortic valve is grossly normal. Aortic valve regurgitation is not  visualized. No aortic stenosis is present.   8. The inferior vena cava is dilated in size with <50% respiratory  variability, suggesting right atrial pressure of 15 mmHg.   Patient Profile     86 y.o. female with hx of atrial fibrillation and chronic systolic HF who was seen for preoperative cardiac evaluation.  Assessment & Plan    Permanent Afib - continue amiodarone and dig - Dig level 0.6 - Eliquis 2.5mg  BID  - Patient is asymptomatic - Metoprolol 25 mg BID restarted for elevated rates and rates are much better, 80-90s.   Chronic systolic HF - Echo this admission showed EF 35%, in comparison to prior in 2019 RV size increased and systolic function decreased, TR increased from mod to severe. EF minimally improved. - Euvolemic on exam   S/P fall w/ R hip fx and repair - followed by orthopedics - Plan for CIR at discharge   For questions or updates, please contact [facility] Please consult [email or web address] for contact info under        Signed, Cadence Ninfa Meeker, PA-C  08/08/2019, 7:42 AM    I have seen and examined the patient along with Cadence Ninfa Meeker, PA-C.  I have reviewed the chart, notes and new data.  I agree with PA/NP's note. [facility] will sign off.   Medication Recommendations: Continue her usual home regimen of cardiovascular medications of amiodarone 200 mg daily, digoxin 0.125 mg daily,  Eliquis 2.5 mg twice daily and added metoprolol 25 mg twice daily. Other recommendations (labs, testing, etc):  n/a Follow up as an outpatient: She has an appointment already scheduled for May 21 in the heart failure clinic with Dr. Verlon Au, MD, [place] [phone] 08/08/2019, 1:49 PM

## 2019-08-08 NOTE — Discharge Instructions (Signed)

## 2019-08-08 NOTE — Op Note (Signed)
NAMEANMARIE, FUKUSHIMA MEDICAL RECORD EX:[id number] ACCOUNT [id number] DATE OF BIRTH:1930/04/22 FACILITY: [facility] LOCATION: [facility] PHYSICIAN:Aubrionna Istre Starling Manns, MD  OPERATIVE REPORT  DATE OF PROCEDURE:  08/05/2019  SURGEON:  Jodi Geralds, MD  ASSISTANT:  Gus Puma PA-C, was present for the entire case and assisted by retraction of the leg manipulation of tissues, and closing to minimize OR time.    PREOPERATIVE DIAGNOSIS:  Comminuted displaced femoral neck fracture, left.  POSTOPERATIVE DIAGNOSIS:  Comminuted displaced femoral neck fracture, left.  PRINCIPAL PROCEDURES: 1.  Left bipolar hemiarthroplasty through an anterior approach.   2.  Interpretation of multiple intraoperative fluoroscopic images.  BRIEF HISTORY:  The patient is an 86 year old female who fell and suffered a femoral neck fracture on the left side.  We were consulted for evaluation and management of her problem.  At that point, we had discussion, but felt that given her overall age  and situation, anterior approach might be better for her and felt that hemiarthroplasty would be appropriate given her age and activity level.  She was brought to the operating room for this procedure.  DESCRIPTION OF PROCEDURE:  The patient brought to the operating room after adequate anesthesia was obtained with a general anesthetic, the patient was placed supine on the operating table.  The left leg was then prepped and draped in usual sterile  fashion.  She was placed on the Hana bed and all bony prominences well padded.  Attention was turned to the left hip where an incision was made for an anterior approach to the hip, subcutaneous tissue down to the tensor fascia.  The muscle was clearly  identified.  A finger dissected through the fascia and retractors put in place above and below the femoral neck.  Capsule was opened and T'd and a provisional neck cut was made.  This bone was removed, as well as the femoral head, sized on the back table  to 46 and attention was turned towards removal of loose pieces.  The acetabulum was irrigated and dissected free.  We then turned to the stem side.  It was externally rotated, extended and adducted sequentially broached up to a level of 12.  The 12 was  a perfect fit.  We put a 46 ball on with a +5 head initially and tried the reduction.  We struggled getting the reduction in and then ultimately had to the acetabular side to do a little bit of release of the labrum, and then ultimately we were able to  get that in.  The +5 seemed to be the appropriate length.  We then took it back, put a +5 ball on and put the final size 12 stem in with a bipolar head 46 in the socket with a 28 head on the internal diameter.  She then was reduced and final images were  taken.  Anatomic alignment as well as leg length were completed.  At this point, the wound was irrigated and suctioned dry.  The capsule was closed with #1 Vicryl running.  The tensor fascia was closed with 0 Vicryl.  The skin was closed with 0 and 2-0  Vicryl and 3-0 Monocryl subcuticular.  Benzoin and Steri-Strips were applied.  Sterile compressive dressing was applied.  The patient was taken to the recovery room, noted to be in satisfactory condition.    ESTIMATED BLOOD LOSS:  About 300 mL.  COMPLICATIONS:  None.  VN/NUANCE  D:08/08/2019 T:08/08/2019 JOB:[id number]

## 2019-08-09 LAB — CBC WITH DIFFERENTIAL/PLATELET
Abs Immature Granulocytes: 0.13 10*3/uL — ABNORMAL HIGH (ref 0.00–0.07)
Basophils Absolute: 0.1 10*3/uL (ref 0.0–0.1)
Basophils Relative: 1 %
Eosinophils Absolute: 0.2 10*3/uL (ref 0.0–0.5)
Eosinophils Relative: 2 %
HCT: 29.7 % — ABNORMAL LOW (ref 36.0–46.0)
Hemoglobin: 9.6 g/dL — ABNORMAL LOW (ref 12.0–15.0)
Immature Granulocytes: 1 %
Lymphocytes Relative: 23 %
Lymphs Abs: 2.3 10*3/uL (ref 0.7–4.0)
MCH: 29.9 pg (ref 26.0–34.0)
MCHC: 32.3 g/dL (ref 30.0–36.0)
MCV: 92.5 fL (ref 80.0–100.0)
Monocytes Absolute: 0.8 10*3/uL (ref 0.1–1.0)
Monocytes Relative: 8 %
Neutro Abs: 6.4 10*3/uL (ref 1.7–7.7)
Neutrophils Relative %: 65 %
Platelets: 232 10*3/uL (ref 150–400)
RBC: 3.21 MIL/uL — ABNORMAL LOW (ref 3.87–5.11)
RDW: 14.5 % (ref 11.5–15.5)
WBC: 9.9 10*3/uL (ref 4.0–10.5)
nRBC: 0 % (ref 0.0–0.2)

## 2019-08-09 LAB — GLUCOSE, CAPILLARY
Glucose-Capillary: 107 mg/dL — ABNORMAL HIGH (ref 70–99)
Glucose-Capillary: 92 mg/dL (ref 70–99)
Glucose-Capillary: 99 mg/dL (ref 70–99)
Glucose-Capillary: 120 mg/dL — ABNORMAL HIGH (ref 70–99)
Glucose-Capillary: 103 mg/dL — ABNORMAL HIGH (ref 70–99)

## 2019-08-09 NOTE — Progress Notes (Signed)
PROGRESS NOTE    [name]  HYQ:[id number] DOB: [birth year] DOA: 08/03/2019 PCP: [name], MD   Brief Narrative:  [name] is an 86 y.o. female past medical history significant for nonischemic cardiomyopathy, CHF with an EF of 20 to 25% 2019, A. fib on Eliquis, irritable bowel syndrome, depression admitted April 25 after a mechanical fall with left hip fracture.  She was admitted under hospitalist service.  Orthopedics was consulted.  Due to A. fib with rapid heart rate, cardiology was consulted for orthopedic recommendations.  Patient underwent left hemiarthroplasty by orthopedics on 08/05/2019.  Patient then developed A. fib with RVR on the evening of 08/06/2019 and was moved to progressive unit.  Assessment & Plan:   Principal Problem:   Displaced fracture of left femoral neck (HCC) Active Problems:   Nonischemic cardiomyopathy (HCC)   ATRIAL FIBRILLATION   Depression   Acute lower UTI   IBS (irritable bowel syndrome)   Acute kidney injury (HCC)   Severe tricuspid regurgitation  #1.  Left hip fracture.  Mechanical fall.  X-ray reveals fracture of the left femoral neck. underwent left hemiarthroplasty on 08/05/2019.  Doing fine postoperatively.  Management per orthopedics.  #2.  Acute kidney injury.  Creatinine 1.2> 1.13> 0.72. Resolved.    #3.  Atrial fibrillation with a left bundle branch block.  Was having intermittent elevations of her heart rate at admission.  Home medications include amiodarone and dig.  She was evaluated by cardiology for risk stratification who recommended resuming beta-blocker that had recently been discontinued due to increased fatigue and dizziness.  Heart rates have been for most part since last 24 hours.  Under 100 -Continue beta-blocker -Continue amiodarone -Continue dig -Continue Eliquis. -Monitor  #4.  Combined CHF.  Does not appear overloaded.  Last echo 2019 with an EF of 20%.  Evaluated by cardiology as noted above.   Cardiology recommends judicious management of IV fluids -Monitor intake and output -Attain daily weights -Medications as noted above  #5.  Acute blood loss anemia.  Hemoglobin dropped from 13.2-11.2> 9.3> 9> 9.6 today.  Postoperative anemia.  No signs of active bleeding.  No indication of transfusion.  Continue to monitor daily.  #6.  Recent urinary tract infection.  Antibiotics started April 22.  Patient is afebrile hemodynamically stable and nontoxic-appearing -Completed Keflex on 08/07/2019.  DVT prophylaxis: Eliquis   Code Status: DNR  Family Communication: Husband present at bedside.  Plan of care discussed with patient in length and he verbalized understanding and agreed with it. Patient is from: Home Disposition Plan: PT recommends CIR. Barriers to discharge: Pending insurance authorization for discharge to CIR  Status is: Inpatient  Remains inpatient appropriate because:Unsafe d/c plan   Dispo: The patient is from: Home              Anticipated d/c is to: CIR              Anticipated d/c date is: When insurance authorization received.              Patient currently Is medically stable for DC         Estimated body mass index is 26.71 kg/m as calculated from the following:   Height as of this encounter: 5\' 5"  (1.651 m).   Weight as of this encounter: 72.8 kg.      Nutritional status:               Consultants:   Orthopedics  Procedures:  Left hemiarthroplasty 08/05/2019  Antimicrobials:  Anti-infectives (From admission, onward)   Start     Dose/Rate Route Frequency Ordered Stop   08/05/19 2230  ceFAZolin (ANCEF) IVPB 2g/100 mL premix     2 g 200 mL/hr over 30 Minutes Intravenous Every 6 hours 08/05/19 2042 08/06/19 0428   08/05/19 0800  ceFAZolin (ANCEF) IVPB 2g/100 mL premix     2 g 200 mL/hr over 30 Minutes Intravenous To [facility] 08/04/19 2229 08/05/19 1713   08/04/19 0200  cephALEXin (KEFLEX) capsule 250 mg     250 mg Oral  2 times daily 08/04/19 0127 08/07/19 2359         Subjective: Patient seen and examined.  Husband at the bedside.  No complaints.  Feels better than yesterday.  Objective: Vitals:   08/09/19 0403 08/09/19 0805 08/09/19 0843 08/09/19 1251  BP:  118/69  (!) 105/51  Pulse:  89 89 84  Resp:  15  15  Temp:  (!) 97.5 F (36.4 C)  98.4 F (36.9 C)  TempSrc:  Oral  Oral  SpO2:  96%  94%  Weight: 72.8 kg     Height:        Intake/Output Summary (Last 24 hours) at 08/09/2019 1511 Last data filed at 08/09/2019 1226 Gross per 24 hour  Intake 380 ml  Output 150 ml  Net 230 ml   Filed Weights   08/03/19 2244 08/08/19 0433 08/09/19 0403  Weight: 72.6 kg 75.1 kg 72.8 kg    Examination:  General exam: Appears calm and comfortable  Respiratory system: Clear to auscultation. Respiratory effort normal. Cardiovascular system: S1 & S2 heard, irregularly irregular rate and rhythm. No JVD, murmurs, rubs, gallops or clicks. No pedal edema. Gastrointestinal system: Abdomen is nondistended, soft and nontender. No organomegaly or masses felt. Normal bowel sounds heard. Central nervous system: Alert and oriented. No focal neurological deficits. Extremities: Symmetric 5 x 5 power. Skin: No rashes, lesions or ulcers.  Psychiatry: Judgement and insight appear normal. Mood & affect appropriate.    Data Reviewed: I have personally reviewed following labs and imaging studies  CBC: Recent Labs  Lab 08/05/19 0155 08/06/19 0439 08/07/19 0259 08/08/19 0156 08/09/19 0221  WBC 13.3* 9.6 10.6* 12.3* 9.9  NEUTROABS  --   --   --   --  6.4  HGB 13.2 11.2* 9.3* 9.9* 9.6*  HCT 41.5 34.4* 29.3* 30.1* 29.7*  MCV 93.5 92.2 91.6 92.9 92.5  PLT 186 172 180 227 144   Basic Metabolic Panel: Recent Labs  Lab 08/04/19 0805 08/05/19 0155 08/06/19 0439 08/07/19 0259 08/08/19 0156  NA 137 133* 132* 135 135  K 4.4 4.7 4.6 3.8 3.8  CL 102 97* 97* 99 99  CO2 25 24 26 22 27   GLUCOSE 118* 216* 151* 111*  108*  BUN 10 15 19 17 13   CREATININE 0.72 1.25* 1.13* 0.72 0.76  CALCIUM 9.0 8.9 8.7* 9.4 8.8*   GFR: Estimated Creatinine Clearance: 50.4 mL/min (by C-G formula based on SCr of 0.76 mg/dL). Liver Function Tests: No results for input(s): AST, ALT, ALKPHOS, BILITOT, PROT, ALBUMIN in the last 168 hours. No results for input(s): LIPASE, AMYLASE in the last 168 hours. No results for input(s): AMMONIA in the last 168 hours. Coagulation Profile: Recent Labs  Lab 08/03/19 2220  INR 1.1   Cardiac Enzymes: No results for input(s): CKTOTAL, CKMB, CKMBINDEX, TROPONINI in the last 168 hours. BNP (last 3 results) No results for input(s): PROBNP in the last 8760  hours. HbA1C: No results for input(s): HGBA1C in the last 72 hours. CBG: Recent Labs  Lab 08/07/19 2156 08/08/19 0605 08/08/19 1144 08/08/19 1606 08/09/19 1121  GLUCAP 106* 94 97 134* 99   Lipid Profile: No results for input(s): CHOL, HDL, LDLCALC, TRIG, CHOLHDL, LDLDIRECT in the last 72 hours. Thyroid Function Tests: No results for input(s): TSH, T4TOTAL, FREET4, T3FREE, THYROIDAB in the last 72 hours. Anemia Panel: No results for input(s): VITAMINB12, FOLATE, FERRITIN, TIBC, IRON, RETICCTPCT in the last 72 hours. Sepsis Labs: No results for input(s): PROCALCITON, LATICACIDVEN in the last 168 hours.  Recent Results (from the past 240 hour(s))  Urine Culture     Status: Abnormal   Collection Time: 08/01/19 10:03 AM   Specimen: Urine  Result Value Ref Range Status   MICRO NUMBER: [id number]  Final   SPECIMEN QUALITY: Adequate  Final   Sample Source NOT GIVEN  Final   STATUS: FINAL  Final   ISOLATE 1: Klebsiella pneumoniae (A)  Final    Comment: Greater than 100,000 CFU/mL of Klebsiella pneumoniae      Susceptibility   Klebsiella pneumoniae - URINE CULTURE, REFLEX    AMOX/CLAVULANIC <=2 Sensitive     AMPICILLIN 16 Resistant     AMPICILLIN/SULBACTAM <=2 Sensitive     CEFAZOLIN* <=4 Not Reportable      * For infections  other than uncomplicated UTIcaused by E. coli, K. pneumoniae or P. mirabilis:Cefazolin is resistant if MIC > or = 8 mcg/mL.(Distinguishing susceptible versus intermediatefor isolates with MIC < or = 4 mcg/mL requiresadditional testing.)For uncomplicated UTI caused by E. coli,K. pneumoniae or P. mirabilis: Cefazolin issusceptible if MIC <32 mcg/mL and predictssusceptible to the oral agents cefaclor, cefdinir,cefpodoxime, cefprozil, cefuroxime, cephalexinand loracarbef.    CEFEPIME <=1 Sensitive     CEFTRIAXONE <=1 Sensitive     CIPROFLOXACIN <=0.25 Sensitive     LEVOFLOXACIN <=0.12 Sensitive     ERTAPENEM <=0.5 Sensitive     GENTAMICIN <=1 Sensitive     IMIPENEM <=0.25 Sensitive     NITROFURANTOIN 32 Sensitive     PIP/TAZO <=4 Sensitive     TOBRAMYCIN <=1 Sensitive     TRIMETH/SULFA* <=20 Sensitive      * For infections other than uncomplicated UTIcaused by E. coli, K. pneumoniae or P. mirabilis:Cefazolin is resistant if MIC > or = 8 mcg/mL.(Distinguishing susceptible versus intermediatefor isolates with MIC < or = 4 mcg/mL requiresadditional testing.)For uncomplicated UTI caused by E. coli,K. pneumoniae or P. mirabilis: Cefazolin issusceptible if MIC <32 mcg/mL and predictssusceptible to the oral agents cefaclor, cefdinir,cefpodoxime, cefprozil, cefuroxime, cephalexinand loracarbef.Legend:S = Susceptible  I = IntermediateR = Resistant  NS = Not susceptible* = Not tested  NR = Not reported**NN = See antimicrobic comments  Respiratory Panel by RT PCR (Flu A&B, Covid) - Nasopharyngeal Swab     Status: None   Collection Time: 08/03/19 10:16 PM   Specimen: Nasopharyngeal Swab  Result Value Ref Range Status   SARS Coronavirus 2 by RT PCR NEGATIVE NEGATIVE Final    Comment: (NOTE) SARS-CoV-2 target nucleic acids are NOT DETECTED. The SARS-CoV-2 RNA is generally detectable in upper respiratoy specimens during the acute phase of infection. The lowest concentration of SARS-CoV-2 viral copies this assay  can detect is 131 copies/mL. A negative result does not preclude SARS-Cov-2 infection and should not be used as the sole basis for treatment or other patient management decisions. A negative result may occur with  improper specimen collection/handling, submission of specimen other than nasopharyngeal swab, presence of viral  mutation(s) within the areas targeted by this assay, and inadequate number of viral copies (<131 copies/mL). A negative result must be combined with clinical observations, patient history, and epidemiological information. The expected result is Negative. Fact Sheet for Patients:  https://www.moore.com/ Fact Sheet for Healthcare Providers:  https://www.young.biz/ This test is not yet ap proved or cleared by the [place] FDA and  has been authorized for detection and/or diagnosis of SARS-CoV-2 by FDA under an Emergency Use Authorization (EUA). This EUA will remain  in effect (meaning this test can be used) for the duration of the COVID-19 declaration under Section 564(b)(1) of the Act, 21 U.S.C. section 360bbb-3(b)(1), unless the authorization is terminated or revoked sooner.    Influenza A by PCR NEGATIVE NEGATIVE Final   Influenza B by PCR NEGATIVE NEGATIVE Final    Comment: (NOTE) The Xpert Xpress SARS-CoV-2/FLU/RSV assay is intended as an aid in  the diagnosis of influenza from Nasopharyngeal swab specimens and  should not be used as a sole basis for treatment. Nasal washings and  aspirates are unacceptable for Xpert Xpress SARS-CoV-2/FLU/RSV  testing. Fact Sheet for Patients: https://www.moore.com/ Fact Sheet for Healthcare Providers: https://www.young.biz/ This test is not yet approved or cleared by the [place] FDA and  has been authorized for detection and/or diagnosis of SARS-CoV-2 by  FDA under an Emergency Use Authorization (EUA). This EUA will remain  in effect  (meaning this test can be used) for the duration of the  Covid-19 declaration under Section 564(b)(1) of the Act, 21  U.S.C. section 360bbb-3(b)(1), unless the authorization is  terminated or revoked. Performed at [facility], [address], [place], [place] [address]   MRSA PCR Screening     Status: None   Collection Time: 08/04/19  4:07 AM   Specimen: Nasal Mucosa; Nasopharyngeal  Result Value Ref Range Status   MRSA by PCR NEGATIVE NEGATIVE Final    Comment:        The GeneXpert MRSA Assay (FDA approved for NASAL specimens only), is one component of a comprehensive MRSA colonization surveillance program. It is not intended to diagnose MRSA infection nor to guide or monitor treatment for MRSA infections. Performed at [facility], [address], [place], [place] [address]       Radiology Studies: No results found.  Scheduled Meds: . amiodarone  200 mg Oral Daily  . apixaban  2.5 mg Oral BID  . Chlorhexidine Gluconate Cloth  6 each Topical Daily  . digoxin  0.125 mg Oral Daily  . ferrous sulfate  325 mg Oral Q breakfast  . insulin aspart  0-15 Units Subcutaneous TID WC  . insulin aspart  0-5 Units Subcutaneous QHS  . metoprolol tartrate  25 mg Oral BID  . senna  1 tablet Oral BID  . sertraline  100 mg Oral Daily   Continuous Infusions: . methocarbamol (ROBAXIN) IV       LOS: 5 days   Time spent: 28 minutes   Hughie Closs, MD [facility]  08/09/2019, 3:11 PM   To contact the attending provider between 7A-7P or the covering provider during after hours 7P-7A, please log into the web site [email or web address].

## 2019-08-09 NOTE — Progress Notes (Signed)
Physical Therapy Treatment Patient Details Name: [name] MRN: [id number] DOB: [birth year] Today's Date: 08/09/2019    History of Present Illness [name] is an 86 y.o. female past medical history significant for nonischemic cardiomyopathy, CHF with an EF of 20 to 25% 2019, A. fib on Eliquis, irritable bowel syndrome, depression, L TKA admitted April 25 after a mechanical fall with left hip fracture.    Patient underwent left hemiarthroplasty by direct anterior approach on 08/05/2019    PT Comments    Pt continues to make steady progress. Continue to recommend CIR before return home.    Follow Up Recommendations  CIR;Supervision/Assistance - 24 hour     Equipment Recommendations  3in1 (PT)    Recommendations for Other Services Rehab consult;OT consult     Precautions / Restrictions Precautions Precautions: Fall Restrictions Weight Bearing Restrictions: Yes LLE Weight Bearing: Weight bearing as tolerated    Mobility  Bed Mobility               General bed mobility comments: Pt up in chair  Transfers Overall transfer level: Needs assistance Equipment used: Rolling walker (2 wheeled) Transfers: Sit to/from Stand Sit to Stand: Min assist         General transfer comment: Assist to bring hips up and for balance. Verbal cues for hand placement  Ambulation/Gait Ambulation/Gait assistance: Min assist Gait Distance (Feet): 60 Feet(12' x 1, 40' x 1, 60' x 1) Assistive device: Rolling walker (2 wheeled) Gait Pattern/deviations: Decreased stance time - left;Antalgic;Step-through pattern;Trunk flexed Gait velocity: decreased Gait velocity interpretation: <1.31 ft/sec, indicative of household ambulator General Gait Details: Assist for balance and support.    Stairs             Wheelchair Mobility    Modified Rankin (Stroke Patients Only)       Balance Overall balance assessment: Needs assistance;History of Falls Sitting-balance support:  Bilateral upper extremity supported;Feet supported Sitting balance-Leahy Scale: Fair     Standing balance support: Bilateral upper extremity supported Standing balance-Leahy Scale: Poor Standing balance comment: walker and min assist for static standing                            Cognition Arousal/Alertness: Awake/alert Behavior During Therapy: WFL for tasks assessed/performed Overall Cognitive Status: Within Functional Limits for tasks assessed                                        Exercises      General Comments General comments (skin integrity, edema, etc.): HR 80's after amb      Pertinent Vitals/Pain Pain Assessment: Faces Faces Pain Scale: Hurts little more Pain Location: L hip Pain Descriptors / Indicators: Sore Pain Intervention(s): Limited activity within patient's tolerance;Monitored during session    Home Living                      Prior Function            PT Goals (current goals can now be found in the care plan section) Acute Rehab PT Goals Patient Stated Goal: rehab and then home Progress towards PT goals: Progressing toward goals    Frequency    Min 4X/week      PT Plan Current plan remains appropriate    Co-evaluation  AM-PAC PT "6 Clicks" Mobility   Outcome Measure  Help needed turning from your back to your side while in a flat bed without using bedrails?: A Lot Help needed moving from lying on your back to sitting on the side of a flat bed without using bedrails?: A Little Help needed moving to and from a bed to a chair (including a wheelchair)?: A Little Help needed standing up from a chair using your arms (e.g., wheelchair or bedside chair)?: A Little Help needed to walk in hospital room?: A Little Help needed climbing 3-5 steps with a railing? : Total 6 Click Score: 15    End of Session Equipment Utilized During Treatment: Gait belt Activity Tolerance: Patient tolerated  treatment well Patient left: in chair;with call bell/phone within reach;with family/visitor present;with chair alarm set Nurse Communication: Mobility status PT Visit Diagnosis: Unsteadiness on feet (R26.81);Repeated falls (R29.6);Difficulty in walking, not elsewhere classified (R26.2);Pain;Muscle weakness (generalized) (M62.81) Pain - Right/Left: Left Pain - part of body: Hip     Time: 1012-1027 PT Time Calculation (min) (ACUTE ONLY): 15 min  Charges:  $Gait Training: 8-22 mins                     [facility] PT Acute Rehabilitation Services Pager [phone] Office [phone]    Angelina Ok [facility] 08/09/2019, 11:16 AM

## 2019-08-09 NOTE — Progress Notes (Signed)
Inpatient Rehab Admissions Coordinator:   Awaiting determination from pt's insurance.  Will f/u on Monday.   Estill Dooms, PT, DPT Admissions Coordinator [phone] 08/09/19  4:05 PM

## 2019-08-09 NOTE — Progress Notes (Signed)
Occupational Therapy Treatment Note  Pt making steady progress toward increasing independence with ADL and mobility. Requires Min A with mobility and mod A with LB dressing @ RW level. At risk for falls and would greatly benefit from intensive rehab at [facility] to facilitate safe DC home with husband. VSS throughout session. Pt very motivated to be more independent and safe. Will continue to follow acutely.    08/09/19 1300  OT Visit Information  Last OT Received On 08/09/19  Assistance Needed +1  History of Present Illness [name] is an 86 y.o. female past medical history significant for nonischemic cardiomyopathy, CHF with an EF of 20 to 25% 2019, A. fib on Eliquis, irritable bowel syndrome, depression, L TKA admitted April 25 after a mechanical fall with left hip fracture.    Patient underwent left hemiarthroplasty by direct anterior approach on 08/05/2019  Precautions  Precautions Fall  Pain Assessment  Pain Assessment 0-10  Pain Score 5  Pain Location L hip  Pain Descriptors / Indicators Sore  Pain Intervention(s) Limited activity within patient's tolerance  Cognition  Arousal/Alertness Awake/alert  Behavior During Therapy WFL for tasks assessed/performed  Overall Cognitive Status Within Functional Limits for tasks assessed  Upper Extremity Assessment  Upper Extremity Assessment Generalized weakness  Lower Extremity Assessment  Lower Extremity Assessment Defer to PT evaluation  ADL  Overall ADL's  Needs assistance/impaired  Upper Body Bathing Set up;Sitting  Lower Body Bathing Moderate assistance;Sit to/from stand  Upper Body Dressing  Set up;Sitting  Lower Body Dressing Moderate assistance;Sit to/from Scientist, research (life sciences) Minimal assistance;Ambulation;BSC;RW (over toilet)  Toileting- Clothing Manipulation and Hygiene Minimal assistance;Sit to/from stand  Functional mobility during ADLs Minimal assistance;Rolling walker;Cueing for safety  General ADL Comments Pt has  difficulty raching L foot. Demonstrated sock aid however pt not interested in any AE for ADL. Recommended use of reacher to reduce risk of falls. Pt verbalized understanding.   Bed Mobility  General bed mobility comments Pt up in chair  Balance  Overall balance assessment Needs assistance;History of Falls  Sitting-balance support Bilateral upper extremity supported;Feet supported  Sitting balance-Leahy Scale Fair  Standing balance support Bilateral upper extremity supported  Standing balance-Leahy Scale Poor  Standing balance comment walker and min assist for static standing  Restrictions  Weight Bearing Restrictions Yes  LLE Weight Bearing WBAT  Transfers  Overall transfer level Needs assistance  Equipment used Rolling walker (2 wheeled)  Transfers Sit to/from Stand  Sit to Stand Min assist  General transfer comment Assist to bring hips up and for balance. Verbal cues for hand placement  General Comments  General comments (skin integrity, edema, etc.) VSS; easily fatigues  OT - End of Session  Equipment Utilized During Treatment Rolling walker  Activity Tolerance Patient tolerated treatment well  Patient left in chair;with chair alarm set  Nurse Communication Mobility status  OT Assessment/Plan  OT Plan Discharge plan remains appropriate  OT Visit Diagnosis Unsteadiness on feet (R26.81);Other abnormalities of gait and mobility (R26.89);Other symptoms and signs involving cognitive function;Muscle weakness (generalized) (M62.81);Pain  Pain - Right/Left Left  Pain - part of body Leg;Hip  OT Frequency (ACUTE ONLY) Min 2X/week  Follow Up Recommendations CIR;Supervision/Assistance - 24 hour  OT Equipment 3 in 1 bedside commode  AM-PAC OT "6 Clicks" Daily Activity Outcome Measure (Version 2)  Help from another person eating meals? 4  Help from another person taking care of personal grooming? 3  Help from another person toileting, which includes using toliet, bedpan, or urinal?  3  Help  from another person bathing (including washing, rinsing, drying)? 2  Help from another person to put on and taking off regular upper body clothing? 3  Help from another person to put on and taking off regular lower body clothing? 2  6 Click Score 17  OT Goal Progression  Progress towards OT goals Progressing toward goals  Acute Rehab OT Goals  Patient Stated Goal rehab and then home  OT Goal Formulation With patient  Time For Goal Achievement 08/21/19  Potential to Achieve Goals Good  ADL Goals  Pt Will Perform Grooming with min assist;standing  Pt Will Perform Lower Body Bathing with min assist;sit to/from stand;sitting/lateral leans  Pt Will Perform Upper Body Dressing with modified independence;sitting  Pt Will Perform Lower Body Dressing with min assist;sitting/lateral leans;sit to/from stand  Pt Will Transfer to Toilet with min assist;stand pivot transfer;bedside commode  OT Time Calculation  OT Start Time (ACUTE ONLY) 1133  OT Stop Time (ACUTE ONLY) 1200  OT Time Calculation (min) 27 min  OT General Charges  $OT Visit 1 Visit  OT Treatments  $Self Care/Home Management  23-37 mins  Maurie Boettcher, OT/L   Acute OT Clinical Specialist [place] Pager [phone] Office [phone]

## 2019-08-10 ENCOUNTER — Inpatient Hospital Stay (HOSPITAL_COMMUNITY): Payer: Medicare Other

## 2019-08-10 LAB — GLUCOSE, CAPILLARY
Glucose-Capillary: 98 mg/dL (ref 70–99)
Glucose-Capillary: 122 mg/dL — ABNORMAL HIGH (ref 70–99)
Glucose-Capillary: 99 mg/dL (ref 70–99)
Glucose-Capillary: 90 mg/dL (ref 70–99)

## 2019-08-10 MED ORDER — METOPROLOL SUCCINATE ER 50 MG PO TB24
50.0000 mg | ORAL_TABLET | Freq: Every day | ORAL | Status: DC
  Administered 2019-08-11 – 2019-08-12 (×2): 50 mg via ORAL
  Filled 2019-08-10 (×2): qty 1

## 2019-08-10 MED ORDER — METOPROLOL TARTRATE 25 MG PO TABS
25.0000 mg | ORAL_TABLET | Freq: Two times a day (BID) | ORAL | Status: AC
  Administered 2019-08-10: 25 mg via ORAL
  Filled 2019-08-10: qty 1

## 2019-08-10 NOTE — Plan of Care (Signed)
Continue to monitor

## 2019-08-10 NOTE — Progress Notes (Addendum)
Patient c/o severe pain to left hip. More than usual per patient. Per report this am from night RN, patient went to sit in chair at sink this morning and upon sitting had severe pain in hip. Patient's left hip has a protruding "knot" that is noticeably new to patient. Morphine given pert MD order. PA notified at this time.

## 2019-08-10 NOTE — Progress Notes (Signed)
Patient refused having buttock dressing change today. She states that she wants to wait until tomorrow.

## 2019-08-10 NOTE — Progress Notes (Addendum)
PATIENT ID: [name]  MRN: [id number]  DOB/AGE:  February 02, 1931 / 86 y.o.  5 Days Post-Op Procedure(s) (LRB): ANTERIOR APPROACH HEMI HIP ARTHROPLASTY (Left)    PROGRESS NOTE Subjective: Patient is alert, oriented, no Nausea, no Vomiting, yes passing gas, . Taking PO well. Denies SOB, Chest or Calf Pain. Using Incentive Spirometer, PAS in place. Ambulate WBAT with pt sitting down very hard and having increased pain after.  Pt also noticed swelling laterally. Patient reports pain as  Moderate or severe  .    Objective: Vital signs in last 24 hours: Vitals:   08/10/19 0013 08/10/19 0331 08/10/19 0800 08/10/19 0908  BP: 131/73 139/77 140/74   Pulse: 66 78 (!) 101 96  Resp: 20 20 (!) 26   Temp: 97.9 F (36.6 C) 98.1 F (36.7 C) 97.6 F (36.4 C)   TempSrc: Oral Oral Oral   SpO2: 98% 97% 94%   Weight:  75.1 kg    Height:          Intake/Output from previous day: I/O last 3 completed shifts: In: 380 [P.O.:380] Out: 550 [Urine:550]   Intake/Output this shift: No intake/output data recorded.   LABORATORY DATA: Recent Labs    08/08/19 0156 08/08/19 0605 08/09/19 0221 08/09/19 0647 08/09/19 1616 08/09/19 2116 08/10/19 0612  WBC 12.3*  --  9.9  --   --   --   --   HGB 9.9*  --  9.6*  --   --   --   --   HCT 30.1*  --  29.7*  --   --   --   --   PLT 227  --  232  --   --   --   --   NA 135  --   --   --   --   --   --   K 3.8  --   --   --   --   --   --   CL 99  --   --   --   --   --   --   CO2 27  --   --   --   --   --   --   BUN 13  --   --   --   --   --   --   CREATININE 0.76  --   --   --   --   --   --   GLUCOSE 108*  --   --   --   --   --   --   GLUCAP  --    < >  --    < > 120* 103* 98  CALCIUM 8.8*  --   --   --   --   --   --    < > = values in this interval not displayed.    Examination: Neurologically intact Neurovascular intact Sensation intact distally Intact pulses distally Dorsiflexion/Plantar flexion intact Incision: dressing C/D/I and no  drainage No cellulitis present Compartment soft} XR AP&Lat of hip shows well placed\fixed THA Pt has a golf ball sized hematoma lateally  Assessment:   5 Days Post-Op Procedure(s) (LRB): ANTERIOR APPROACH HEMI HIP ARTHROPLASTY (Left) ADDITIONAL DIAGNOSIS:  Expected Acute Blood Loss Anemia, Nonischemic cardiomyopathy ([place]) Anticipated LOS equal to or greater than 2 midnights due to - Age 60 and older with one or more of the following:  - Obesity  - Expected need for hospital services (  PT, OT, Nursing) required for safe  discharge  - Anticipated need for postoperative skilled nursing care or inpatient rehab   OR   - Unanticipated findings during/Post Surgery: Slow post-op progression: GI, pain control, mobility      Plan: PT/OT WBAT, THA  Weight-bear as tolerated on the left without hip precautions. Preoperative Eliquis dose for her cardiac issues/DVT prophylaxis. Hopefully will be a candidate for CIR. Continue daily physical therapy. Awaiting hip xray results.  x-rays were reviewed of the pelvis and hip.  Per myself and Dr. Turner Daniels we see no new injury.  No signs of fracture.  Most likely the patient's symptoms were from stretching of scar tissue or muscular irritation causing fluid/hematoma to occur.treat conservatively with current medications and ice.  Okay to restart physical therapy.

## 2019-08-10 NOTE — Progress Notes (Signed)
PROGRESS NOTE    [name]  QIO:[id number] DOB: Aug 07, 1930 DOA: 08/03/2019 PCP: [name], MD   Brief Narrative:  [name] is an 86 y.o. female past medical history significant for nonischemic cardiomyopathy, CHF with an EF of 20 to 25% 2019, A. fib on Eliquis, irritable bowel syndrome, depression admitted April 25 after a mechanical fall with left hip fracture.  She was admitted under hospitalist service.  Orthopedics was consulted.  Due to A. fib with rapid heart rate, cardiology was consulted for orthopedic recommendations.  Patient underwent left hemiarthroplasty by orthopedics on 08/05/2019.  Patient then developed A. fib with RVR on the evening of 08/06/2019 and was moved to progressive unit.  Assessment & Plan:   Principal Problem:   Displaced fracture of left femoral neck (HCC) Active Problems:   Nonischemic cardiomyopathy (HCC)   ATRIAL FIBRILLATION   Depression   Acute lower UTI   IBS (irritable bowel syndrome)   Acute kidney injury (HCC)   Severe tricuspid regurgitation  #1.  Left hip fracture.  Mechanical fall.  X-ray reveals fracture of the left femoral neck. underwent left hemiarthroplasty on 08/05/2019.  According to patient and RN, patient felt that she hurt her left hip today when she was trying to sit in the chair and now is hurting more.  Orthopedics was called in the ER obtaining another x-ray today.  Appreciate orthopedics help.  #2.  Acute kidney injury.  Creatinine 1.2> 1.13> 0.72. Resolved.    #3.  Atrial fibrillation with a left bundle branch block.  Was having intermittent elevations of her heart rate at admission.  Home medications include amiodarone and dig.  She was evaluated by cardiology for risk stratification who recommended resuming beta-blocker that had recently been discontinued due to increased fatigue and dizziness.  Heart rates have been for most part since last 24 hours.  Under 100 -Continue beta-blocker -Continue  amiodarone -Continue dig -Continue Eliquis. -Monitor  #4.  Combined CHF.  Does not appear overloaded.  Last echo 2019 with an EF of 20%.  Evaluated by cardiology as noted above.  Cardiology recommends judicious management of IV fluids -Monitor intake and output -Attain daily weights -Medications as noted above  #5.  Acute blood loss anemia.  Hemoglobin dropped from 13.2-11.2> 9.3> 9> 9.6 .  Postoperative anemia.  No signs of active bleeding.  No indication of transfusion.  Continue to monitor daily.  #6.  Recent urinary tract infection.  Antibiotics started April 22.  Patient is afebrile hemodynamically stable and nontoxic-appearing -Completed Keflex on 08/07/2019.  DVT prophylaxis: Eliquis   Code Status: DNR  Family Communication: Husband present at bedside.  Plan of care discussed with patient in length and he verbalized understanding and agreed with it. Patient is from: Home Disposition Plan: PT recommends CIR. Barriers to discharge: Pending insurance authorization for discharge to CIR  Status is: Inpatient  Remains inpatient appropriate because:Unsafe d/c plan   Dispo: The patient is from: Home              Anticipated d/c is to: CIR              Anticipated d/c date is: When insurance authorization received.  Likely Monday, 08/12/2019.              Patient currently Is medically stable for DC         Estimated body mass index is 27.55 kg/m as calculated from the following:   Height as of this encounter: 5\' 5"  (  1.651 m).   Weight as of this encounter: 75.1 kg.      Nutritional status:               Consultants:   Orthopedics  Procedures:   Left hemiarthroplasty 08/05/2019  Antimicrobials:  Anti-infectives (From admission, onward)   Start     Dose/Rate Route Frequency Ordered Stop   08/05/19 2230  ceFAZolin (ANCEF) IVPB 2g/100 mL premix     2 g 200 mL/hr over 30 Minutes Intravenous Every 6 hours 08/05/19 2042 08/06/19 0428   08/05/19 0800   ceFAZolin (ANCEF) IVPB 2g/100 mL premix     2 g 200 mL/hr over 30 Minutes Intravenous To [facility] 08/04/19 2229 08/05/19 1713   08/04/19 0200  cephALEXin (KEFLEX) capsule 250 mg     250 mg Oral 2 times daily 08/04/19 0127 08/07/19 2359         Subjective: Patient seen and examined.  Husband the bedside.  Patient complains of worsening left hip pain and tells me that she felt that her hip pain got worse after she tried to sit in the chair.  Objective: Vitals:   08/10/19 0013 08/10/19 0331 08/10/19 0800 08/10/19 0908  BP: 131/73 139/77 140/74   Pulse: 66 78 (!) 101 96  Resp: 20 20 16    Temp: 97.9 F (36.6 C) 98.1 F (36.7 C) 97.6 F (36.4 C)   TempSrc: Oral Oral Oral   SpO2: 98% 97% 94%   Weight:  75.1 kg    Height:        Intake/Output Summary (Last 24 hours) at 08/10/2019 1019 Last data filed at 08/10/2019 0332 Gross per 24 hour  Intake 140 ml  Output 400 ml  Net -260 ml   Filed Weights   08/08/19 0433 08/09/19 0403 08/10/19 0331  Weight: 75.1 kg 72.8 kg 75.1 kg    Examination:  General exam: Appears calm and comfortable  Respiratory system: Clear to auscultation. Respiratory effort normal. Cardiovascular system: S1 & S2 heard, irregularly irregular rate and rhythm. No JVD, murmurs, rubs, gallops or clicks. No pedal edema. Gastrointestinal system: Abdomen is nondistended, soft and nontender. No organomegaly or masses felt. Normal bowel sounds heard. Central nervous system: Alert and oriented. No focal neurological deficits. Extremities: Symmetric 5 x 5 power.  Dressing at the left hip intact without any tenderness or drainage. Skin: No rashes, lesions or ulcers.  Psychiatry: Judgement and insight appear normal. Mood & affect appropriate.   Data Reviewed: I have personally reviewed following labs and imaging studies  CBC: Recent Labs  Lab 08/05/19 0155 08/06/19 0439 08/07/19 0259 08/08/19 0156 08/09/19 0221  WBC 13.3* 9.6 10.6* 12.3* 9.9  NEUTROABS   --   --   --   --  6.4  HGB 13.2 11.2* 9.3* 9.9* 9.6*  HCT 41.5 34.4* 29.3* 30.1* 29.7*  MCV 93.5 92.2 91.6 92.9 92.5  PLT 186 172 180 227 025   Basic Metabolic Panel: Recent Labs  Lab 08/04/19 0805 08/05/19 0155 08/06/19 0439 08/07/19 0259 08/08/19 0156  NA 137 133* 132* 135 135  K 4.4 4.7 4.6 3.8 3.8  CL 102 97* 97* 99 99  CO2 25 24 26 22 27   GLUCOSE 118* 216* 151* 111* 108*  BUN 10 15 19 17 13   CREATININE 0.72 1.25* 1.13* 0.72 0.76  CALCIUM 9.0 8.9 8.7* 9.4 8.8*   GFR: Estimated Creatinine Clearance: 51.2 mL/min (by C-G formula based on SCr of 0.76 mg/dL). Liver Function Tests: No results for input(s): AST,  ALT, ALKPHOS, BILITOT, PROT, ALBUMIN in the last 168 hours. No results for input(s): LIPASE, AMYLASE in the last 168 hours. No results for input(s): AMMONIA in the last 168 hours. Coagulation Profile: Recent Labs  Lab 08/03/19 2220  INR 1.1   Cardiac Enzymes: No results for input(s): CKTOTAL, CKMB, CKMBINDEX, TROPONINI in the last 168 hours. BNP (last 3 results) No results for input(s): PROBNP in the last 8760 hours. HbA1C: No results for input(s): HGBA1C in the last 72 hours. CBG: Recent Labs  Lab 08/09/19 0647 08/09/19 1121 08/09/19 1616 08/09/19 2116 08/10/19 0612  GLUCAP 92 99 120* 103* 98   Lipid Profile: No results for input(s): CHOL, HDL, LDLCALC, TRIG, CHOLHDL, LDLDIRECT in the last 72 hours. Thyroid Function Tests: No results for input(s): TSH, T4TOTAL, FREET4, T3FREE, THYROIDAB in the last 72 hours. Anemia Panel: No results for input(s): VITAMINB12, FOLATE, FERRITIN, TIBC, IRON, RETICCTPCT in the last 72 hours. Sepsis Labs: No results for input(s): PROCALCITON, LATICACIDVEN in the last 168 hours.  Recent Results (from the past 240 hour(s))  Urine Culture     Status: Abnormal   Collection Time: 08/01/19 10:03 AM   Specimen: Urine  Result Value Ref Range Status   MICRO NUMBER: [id number]  Final   SPECIMEN QUALITY: Adequate  Final   Sample  Source NOT GIVEN  Final   STATUS: FINAL  Final   ISOLATE 1: Klebsiella pneumoniae (A)  Final    Comment: Greater than 100,000 CFU/mL of Klebsiella pneumoniae      Susceptibility   Klebsiella pneumoniae - URINE CULTURE, REFLEX    AMOX/CLAVULANIC <=2 Sensitive     AMPICILLIN 16 Resistant     AMPICILLIN/SULBACTAM <=2 Sensitive     CEFAZOLIN* <=4 Not Reportable      * For infections other than uncomplicated UTIcaused by E. coli, K. pneumoniae or P. mirabilis:Cefazolin is resistant if MIC > or = 8 mcg/mL.(Distinguishing susceptible versus intermediatefor isolates with MIC < or = 4 mcg/mL requiresadditional testing.)For uncomplicated UTI caused by E. coli,K. pneumoniae or P. mirabilis: Cefazolin issusceptible if MIC <32 mcg/mL and predictssusceptible to the oral agents cefaclor, cefdinir,cefpodoxime, cefprozil, cefuroxime, cephalexinand loracarbef.    CEFEPIME <=1 Sensitive     CEFTRIAXONE <=1 Sensitive     CIPROFLOXACIN <=0.25 Sensitive     LEVOFLOXACIN <=0.12 Sensitive     ERTAPENEM <=0.5 Sensitive     GENTAMICIN <=1 Sensitive     IMIPENEM <=0.25 Sensitive     NITROFURANTOIN 32 Sensitive     PIP/TAZO <=4 Sensitive     TOBRAMYCIN <=1 Sensitive     TRIMETH/SULFA* <=20 Sensitive      * For infections other than uncomplicated UTIcaused by E. coli, K. pneumoniae or P. mirabilis:Cefazolin is resistant if MIC > or = 8 mcg/mL.(Distinguishing susceptible versus intermediatefor isolates with MIC < or = 4 mcg/mL requiresadditional testing.)For uncomplicated UTI caused by E. coli,K. pneumoniae or P. mirabilis: Cefazolin issusceptible if MIC <32 mcg/mL and predictssusceptible to the oral agents cefaclor, cefdinir,cefpodoxime, cefprozil, cefuroxime, cephalexinand loracarbef.Legend:S = Susceptible  I = IntermediateR = Resistant  NS = Not susceptible* = Not tested  NR = Not reported**NN = See antimicrobic comments  Respiratory Panel by RT PCR (Flu A&B, Covid) - Nasopharyngeal Swab     Status: None   Collection  Time: 08/03/19 10:16 PM   Specimen: Nasopharyngeal Swab  Result Value Ref Range Status   SARS Coronavirus 2 by RT PCR NEGATIVE NEGATIVE Final    Comment: (NOTE) SARS-CoV-2 target nucleic acids are NOT DETECTED. The SARS-CoV-2  RNA is generally detectable in upper respiratoy specimens during the acute phase of infection. The lowest concentration of SARS-CoV-2 viral copies this assay can detect is 131 copies/mL. A negative result does not preclude SARS-Cov-2 infection and should not be used as the sole basis for treatment or other patient management decisions. A negative result may occur with  improper specimen collection/handling, submission of specimen other than nasopharyngeal swab, presence of viral mutation(s) within the areas targeted by this assay, and inadequate number of viral copies (<131 copies/mL). A negative result must be combined with clinical observations, patient history, and epidemiological information. The expected result is Negative. Fact Sheet for Patients:  https://www.moore.com/ Fact Sheet for Healthcare Providers:  https://www.young.biz/ This test is not yet ap proved or cleared by the [place] FDA and  has been authorized for detection and/or diagnosis of SARS-CoV-2 by FDA under an Emergency Use Authorization (EUA). This EUA will remain  in effect (meaning this test can be used) for the duration of the COVID-19 declaration under Section 564(b)(1) of the Act, 21 U.S.C. section 360bbb-3(b)(1), unless the authorization is terminated or revoked sooner.    Influenza A by PCR NEGATIVE NEGATIVE Final   Influenza B by PCR NEGATIVE NEGATIVE Final    Comment: (NOTE) The Xpert Xpress SARS-CoV-2/FLU/RSV assay is intended as an aid in  the diagnosis of influenza from Nasopharyngeal swab specimens and  should not be used as a sole basis for treatment. Nasal washings and  aspirates are unacceptable for Xpert Xpress  SARS-CoV-2/FLU/RSV  testing. Fact Sheet for Patients: https://www.moore.com/ Fact Sheet for Healthcare Providers: https://www.young.biz/ This test is not yet approved or cleared by the [place] FDA and  has been authorized for detection and/or diagnosis of SARS-CoV-2 by  FDA under an Emergency Use Authorization (EUA). This EUA will remain  in effect (meaning this test can be used) for the duration of the  Covid-19 declaration under Section 564(b)(1) of the Act, 21  U.S.C. section 360bbb-3(b)(1), unless the authorization is  terminated or revoked. Performed at [facility], [address], [place], [place] [address]   MRSA PCR Screening     Status: None   Collection Time: 08/04/19  4:07 AM   Specimen: Nasal Mucosa; Nasopharyngeal  Result Value Ref Range Status   MRSA by PCR NEGATIVE NEGATIVE Final    Comment:        The GeneXpert MRSA Assay (FDA approved for NASAL specimens only), is one component of a comprehensive MRSA colonization surveillance program. It is not intended to diagnose MRSA infection nor to guide or monitor treatment for MRSA infections. Performed at [facility], [address] [place], [place] [address]       Radiology Studies: No results found.  Scheduled Meds: . amiodarone  200 mg Oral Daily  . apixaban  2.5 mg Oral BID  . Chlorhexidine Gluconate Cloth  6 each Topical Daily  . digoxin  0.125 mg Oral Daily  . ferrous sulfate  325 mg Oral Q breakfast  . insulin aspart  0-15 Units Subcutaneous TID WC  . insulin aspart  0-5 Units Subcutaneous QHS  . metoprolol tartrate  25 mg Oral BID  . senna  1 tablet Oral BID  . sertraline  100 mg Oral Daily   Continuous Infusions: . methocarbamol (ROBAXIN) IV       LOS: 6 days   Time spent: 29 minutes   Hughie Closs, MD [facility]  08/10/2019, 10:19 AM   To contact the attending provider  between 7A-7P or the covering provider during after  hours 7P-7A, please log into the web site [email or web address].

## 2019-08-11 LAB — CBC WITH DIFFERENTIAL/PLATELET
Abs Immature Granulocytes: 0.17 10*3/uL — ABNORMAL HIGH (ref 0.00–0.07)
Basophils Absolute: 0 10*3/uL (ref 0.0–0.1)
Basophils Relative: 0 %
Eosinophils Absolute: 0.1 10*3/uL (ref 0.0–0.5)
Eosinophils Relative: 1 %
HCT: 28.4 % — ABNORMAL LOW (ref 36.0–46.0)
Hemoglobin: 9.3 g/dL — ABNORMAL LOW (ref 12.0–15.0)
Immature Granulocytes: 2 %
Lymphocytes Relative: 16 %
Lymphs Abs: 1.7 10*3/uL (ref 0.7–4.0)
MCH: 30.2 pg (ref 26.0–34.0)
MCHC: 32.7 g/dL (ref 30.0–36.0)
MCV: 92.2 fL (ref 80.0–100.0)
Monocytes Absolute: 0.9 10*3/uL (ref 0.1–1.0)
Monocytes Relative: 9 %
Neutro Abs: 7.5 10*3/uL (ref 1.7–7.7)
Neutrophils Relative %: 72 %
Platelets: 291 10*3/uL (ref 150–400)
RBC: 3.08 MIL/uL — ABNORMAL LOW (ref 3.87–5.11)
RDW: 14.9 % (ref 11.5–15.5)
WBC: 10.5 10*3/uL (ref 4.0–10.5)
nRBC: 0 % (ref 0.0–0.2)

## 2019-08-11 LAB — BASIC METABOLIC PANEL
Anion gap: 8 (ref 5–15)
BUN: 10 mg/dL (ref 8–23)
CO2: 26 mmol/L (ref 22–32)
Calcium: 8.6 mg/dL — ABNORMAL LOW (ref 8.9–10.3)
Chloride: 100 mmol/L (ref 98–111)
Creatinine, Ser: 0.72 mg/dL (ref 0.44–1.00)
GFR calc Af Amer: >60 mL/min (ref >60)
GFR calc non Af Amer: >60 mL/min (ref >60)
Glucose, Bld: 106 mg/dL — ABNORMAL HIGH (ref 70–99)
Potassium: 4.1 mmol/L (ref 3.5–5.1)
Sodium: 134 mmol/L — ABNORMAL LOW (ref 135–145)

## 2019-08-11 LAB — GLUCOSE, CAPILLARY
Glucose-Capillary: 115 mg/dL — ABNORMAL HIGH (ref 70–99)
Glucose-Capillary: 125 mg/dL — ABNORMAL HIGH (ref 70–99)
Glucose-Capillary: 105 mg/dL — ABNORMAL HIGH (ref 70–99)

## 2019-08-11 NOTE — TOC Progression Note (Addendum)
Transition of Care [facility]) - Progression Note    Patient Details  Name: [name] MRN: [id number] Date of Birth: [birth year]  [facility]) CM/SW Contact  Eduard Roux, [place] Phone Number: 08/11/2019, 11:41 AM  Clinical Narrative:     CSW visit with the patient at bedside along with her spouse,Jerry. CSW introduced self and explained role. CSW discuss SNF as back up plan for CIR. CSW explained the SNF process. Patient and spouse is agreeable to SNF as back up plan.   CSW will continue to follow and assist with discharge planning.  Antony Blackbird, MSW, LCSWA Clinical Social Worker   Expected Discharge Plan: Skilled Nursing Facility Barriers to Discharge: Continued Medical Work up  Expected Discharge Plan and Services Expected Discharge Plan: Skilled Nursing Facility   Discharge Planning Services: CM Consult Post Acute Care Choice: IP Rehab Living arrangements for the past 2 months: Single Family Home                                       Social Determinants of Health (SDOH) Interventions    Readmission Risk Interventions Readmission Risk Prevention Plan 08/06/2019  Transportation Screening Complete  PCP or Specialist Appt within 5-7 Days Complete  Home Care Screening Complete  Medication Review (RN CM) Complete  Some recent data might be hidden

## 2019-08-11 NOTE — Progress Notes (Signed)
PATIENT ID: [name]  MRN: [id number]  DOB/AGE:  Nov 28, 1930 / 86 y.o.  6 Days Post-Op Procedure(s) (LRB): ANTERIOR APPROACH HEMI HIP ARTHROPLASTY (Left)    PROGRESS NOTE Subjective: Patient is alert, oriented, no Nausea, no Vomiting, yes passing gas, . Taking PO well. Denies SOB, Chest or Calf Pain. Using Incentive Spirometer, PAS in place. Ambulate WBAT with pt up to bed side commode last night. Patient reports pain as  Better than yesterday .    Objective: Vital signs in last 24 hours: Vitals:   08/10/19 2327 08/11/19 0310 08/11/19 0633 08/11/19 0818  BP: (!) 139/59 (!) 137/57  124/66  Pulse: 79 68  75  Resp: 18 18 18 15   Temp: 97.9 F (36.6 C) 97.7 F (36.5 C)  97.9 F (36.6 C)  TempSrc: Oral Oral  Oral  SpO2: 97% 99%    Weight:   75.7 kg   Height:          Intake/Output from previous day: I/O last 3 completed shifts: In: 220 [P.O.:220] Out: 400 [Urine:400]   Intake/Output this shift: No intake/output data recorded.   LABORATORY DATA: Recent Labs    08/09/19 0221 08/09/19 0647 08/10/19 1200 08/10/19 1624 08/10/19 2105 08/11/19 0425  WBC 9.9  --   --   --   --  10.5  HGB 9.6*  --   --   --   --  9.3*  HCT 29.7*  --   --   --   --  28.4*  PLT 232  --   --   --   --  291  NA  --   --   --   --   --  134*  K  --   --   --   --   --  4.1  CL  --   --   --   --   --  100  CO2  --   --   --   --   --  26  BUN  --   --   --   --   --  10  CREATININE  --   --   --   --   --  0.72  GLUCOSE  --   --   --   --   --  106*  GLUCAP  --    < > 122* 99 90  --   CALCIUM  --   --   --   --   --  8.6*   < > = values in this interval not displayed.    Examination: Neurologically intact Neurovascular intact Sensation intact distally Intact pulses distally Dorsiflexion/Plantar flexion intact Incision: dressing C/D/I and scant drainage No cellulitis present Compartment soft} XR AP&Lat of hip shows well placed\fixed THA Pt has diminished swelling in left  thigh  Assessment:   6 Days Post-Op Procedure(s) (LRB): ANTERIOR APPROACH HEMI HIP ARTHROPLASTY (Left) ADDITIONAL DIAGNOSIS:  Expected Acute Blood Loss Anemia,  ATRIAL FIBRILLATION   Depression   Acute lower UTI   IBS (irritable bowel syndrome)   Acute kidney injury ([place])   Severe tricuspid regurgitation Anticipated LOS equal to or greater than 2 midnights due to - Age 90 and older with one or more of the following:  - Obesity  - Expected need for hospital services (PT, OT, Nursing) required for safe  discharge  - Anticipated need for postoperative skilled nursing care or inpatient rehab   OR   - Unanticipated  findings during/Post Surgery: Slow post-op progression: GI, pain control, mobility       Plan: Weight-bear as tolerated on the left without hip precautions. Preoperative Eliquis dose for her cardiac issues/DVT prophylaxis. Hopefully will be a candidate for CIR. Continue daily physical therapy.

## 2019-08-11 NOTE — Plan of Care (Signed)
Continue to monitor

## 2019-08-11 NOTE — Progress Notes (Signed)
PROGRESS NOTE    [name]  YSA:[id number] DOB: 1930-11-12 DOA: 08/03/2019 PCP: [name], MD   Brief Narrative:  [name] is an 86 y.o. female past medical history significant for nonischemic cardiomyopathy, CHF with an EF of 20 to 25% 2019, A. fib on Eliquis, irritable bowel syndrome, depression admitted April 25 after a mechanical fall with left hip fracture.  She was admitted under hospitalist service.  Orthopedics was consulted.  Due to A. fib with rapid heart rate, cardiology was consulted for orthopedic recommendations.  Patient underwent left hemiarthroplasty by orthopedics on 08/05/2019.  Patient then developed A. fib with RVR on the evening of 08/06/2019 and was moved to progressive unit.  Assessment & Plan:   Principal Problem:   Displaced fracture of left femoral neck (HCC) Active Problems:   Nonischemic cardiomyopathy (HCC)   ATRIAL FIBRILLATION   Depression   Acute lower UTI   IBS (irritable bowel syndrome)   Acute kidney injury ([place])   Severe tricuspid regurgitation  #1.  Left hip fracture.  Mechanical fall.  X-ray reveals fracture of the left femoral neck. underwent left hemiarthroplasty on 08/05/2019.  Due to persistent pain, repeat x-ray hip obtained which did not show any pathology.  Appreciate orthopedics help.  #2.  Acute kidney injury.  Creatinine 1.2> 1.13> 0.72. Resolved.    #3.  Atrial fibrillation with a left bundle branch block.  Was having intermittent elevations of her heart rate at admission.  Home medications include amiodarone and dig.  She was evaluated by cardiology for risk stratification who recommended resuming beta-blocker that had recently been discontinued due to increased fatigue and dizziness.  Heart rates have been for most part since last 24 hours.  Under 100 -Continue beta-blocker (switched to Toprol-XL) -Continue amiodarone -Continue dig -Continue Eliquis. -Monitor  #4.  Combined CHF.  Does not appear overloaded.  Last  echo 2019 with an EF of 20%.  Evaluated by cardiology as noted above.  Cardiology recommends judicious management of IV fluids -Monitor intake and output -Attain daily weights -Medications as noted above  #5.  Acute blood loss anemia.  Hemoglobin dropped from 13.2-11.2> 9.3> 9> 9.6> 9.3.  Postoperative anemia.  No signs of active bleeding.  No indication of transfusion.  Continue to monitor daily.  #6.  Recent urinary tract infection.  Antibiotics started April 22.  Patient is afebrile hemodynamically stable and nontoxic-appearing -Completed Keflex on 08/07/2019.  DVT prophylaxis: Eliquis   Code Status: DNR  Family Communication: Husband present at bedside.  Plan of care discussed with patient in length and he verbalized understanding and agreed with it. Patient is from: Home Disposition Plan: PT recommends CIR. Barriers to discharge: Pending insurance authorization for discharge to CIR  Status is: Inpatient  Remains inpatient appropriate because:Unsafe d/c plan   Dispo: The patient is from: Home              Anticipated d/c is to: CIR              Anticipated d/c date is: When insurance authorization received.  Likely Monday, 08/12/2019.              Patient currently Is medically stable for DC         Estimated body mass index is 27.77 kg/m as calculated from the following:   Height as of this encounter: 5\' 5"  (1.651 m).   Weight as of this encounter: 75.7 kg.      Nutritional status:  Consultants:   Orthopedics  Procedures:   Left hemiarthroplasty 08/05/2019  Antimicrobials:  Anti-infectives (From admission, onward)   Start     Dose/Rate Route Frequency Ordered Stop   08/05/19 2230  ceFAZolin (ANCEF) IVPB 2g/100 mL premix     2 g 200 mL/hr over 30 Minutes Intravenous Every 6 hours 08/05/19 2042 08/06/19 0428   08/05/19 0800  ceFAZolin (ANCEF) IVPB 2g/100 mL premix     2 g 200 mL/hr over 30 Minutes Intravenous To [facility]  08/04/19 2229 08/05/19 1713   08/04/19 0200  cephALEXin (KEFLEX) capsule 250 mg     250 mg Oral 2 times daily 08/04/19 0127 08/07/19 2359         Subjective: Patient seen and examined.  Husband at the bedside.  Orthopedics PA at the bedside as well.  Patient is still complains of left hip pain but she also states that she is feeling better than yesterday.  No other complaint.  Objective: Vitals:   08/11/19 0310 08/11/19 0633 08/11/19 0818 08/11/19 0902  BP: (!) 137/57  124/66   Pulse: 68  75 99  Resp: 18 18 15    Temp: 97.7 F (36.5 C)  97.9 F (36.6 C)   TempSrc: Oral  Oral   SpO2: 99%     Weight:  75.7 kg    Height:        Intake/Output Summary (Last 24 hours) at 08/11/2019 1035 Last data filed at 08/10/2019 1300 Gross per 24 hour  Intake 110 ml  Output --  Net 110 ml   Filed Weights   08/09/19 0403 08/10/19 0331 08/11/19 0633  Weight: 72.8 kg 75.1 kg 75.7 kg    Examination:  General exam: Appears calm and comfortable  Respiratory system: Clear to auscultation. Respiratory effort normal. Cardiovascular system: S1 & S2 heard, irregularly irregular rate and rhythm. No JVD, murmurs, rubs, gallops or clicks. No pedal edema. Gastrointestinal system: Abdomen is nondistended, soft and nontender. No organomegaly or masses felt. Normal bowel sounds heard. Central nervous system: Alert and oriented. No focal neurological deficits. Skin: No rashes, lesions or ulcers.  Psychiatry: Judgement and insight appear normal. Mood & affect appropriate.   Data Reviewed: I have personally reviewed following labs and imaging studies  CBC: Recent Labs  Lab 08/06/19 0439 08/07/19 0259 08/08/19 0156 08/09/19 0221 08/11/19 0425  WBC 9.6 10.6* 12.3* 9.9 10.5  NEUTROABS  --   --   --  6.4 7.5  HGB 11.2* 9.3* 9.9* 9.6* 9.3*  HCT 34.4* 29.3* 30.1* 29.7* 28.4*  MCV 92.2 91.6 92.9 92.5 92.2  PLT 172 180 227 232 291   Basic Metabolic Panel: Recent Labs  Lab 08/05/19 0155 08/06/19 0439  08/07/19 0259 08/08/19 0156 08/11/19 0425  NA 133* 132* 135 135 134*  K 4.7 4.6 3.8 3.8 4.1  CL 97* 97* 99 99 100  CO2 24 26 22 27 26   GLUCOSE 216* 151* 111* 108* 106*  BUN 15 19 17 13 10   CREATININE 1.25* 1.13* 0.72 0.76 0.72  CALCIUM 8.9 8.7* 9.4 8.8* 8.6*   GFR: Estimated Creatinine Clearance: 51.4 mL/min (by C-G formula based on SCr of 0.72 mg/dL). Liver Function Tests: No results for input(s): AST, ALT, ALKPHOS, BILITOT, PROT, ALBUMIN in the last 168 hours. No results for input(s): LIPASE, AMYLASE in the last 168 hours. No results for input(s): AMMONIA in the last 168 hours. Coagulation Profile: No results for input(s): INR, PROTIME in the last 168 hours. Cardiac Enzymes: No results for input(s): CKTOTAL,  CKMB, CKMBINDEX, TROPONINI in the last 168 hours. BNP (last 3 results) No results for input(s): PROBNP in the last 8760 hours. HbA1C: No results for input(s): HGBA1C in the last 72 hours. CBG: Recent Labs  Lab 08/09/19 2116 08/10/19 0612 08/10/19 1200 08/10/19 1624 08/10/19 2105  GLUCAP 103* 98 122* 99 90   Lipid Profile: No results for input(s): CHOL, HDL, LDLCALC, TRIG, CHOLHDL, LDLDIRECT in the last 72 hours. Thyroid Function Tests: No results for input(s): TSH, T4TOTAL, FREET4, T3FREE, THYROIDAB in the last 72 hours. Anemia Panel: No results for input(s): VITAMINB12, FOLATE, FERRITIN, TIBC, IRON, RETICCTPCT in the last 72 hours. Sepsis Labs: No results for input(s): PROCALCITON, LATICACIDVEN in the last 168 hours.  Recent Results (from the past 240 hour(s))  Respiratory Panel by RT PCR (Flu A&B, Covid) - Nasopharyngeal Swab     Status: None   Collection Time: 08/03/19 10:16 PM   Specimen: Nasopharyngeal Swab  Result Value Ref Range Status   SARS Coronavirus 2 by RT PCR NEGATIVE NEGATIVE Final    Comment: (NOTE) SARS-CoV-2 target nucleic acids are NOT DETECTED. The SARS-CoV-2 RNA is generally detectable in upper respiratoy specimens during the acute  phase of infection. The lowest concentration of SARS-CoV-2 viral copies this assay can detect is 131 copies/mL. A negative result does not preclude SARS-Cov-2 infection and should not be used as the sole basis for treatment or other patient management decisions. A negative result may occur with  improper specimen collection/handling, submission of specimen other than nasopharyngeal swab, presence of viral mutation(s) within the areas targeted by this assay, and inadequate number of viral copies (<131 copies/mL). A negative result must be combined with clinical observations, patient history, and epidemiological information. The expected result is Negative. Fact Sheet for Patients:  https://www.moore.com/ Fact Sheet for Healthcare Providers:  https://www.young.biz/ This test is not yet ap proved or cleared by the [place] FDA and  has been authorized for detection and/or diagnosis of SARS-CoV-2 by FDA under an Emergency Use Authorization (EUA). This EUA will remain  in effect (meaning this test can be used) for the duration of the COVID-19 declaration under Section 564(b)(1) of the Act, 21 U.S.C. section 360bbb-3(b)(1), unless the authorization is terminated or revoked sooner.    Influenza A by PCR NEGATIVE NEGATIVE Final   Influenza B by PCR NEGATIVE NEGATIVE Final    Comment: (NOTE) The Xpert Xpress SARS-CoV-2/FLU/RSV assay is intended as an aid in  the diagnosis of influenza from Nasopharyngeal swab specimens and  should not be used as a sole basis for treatment. Nasal washings and  aspirates are unacceptable for Xpert Xpress SARS-CoV-2/FLU/RSV  testing. Fact Sheet for Patients: https://www.moore.com/ Fact Sheet for Healthcare Providers: https://www.young.biz/ This test is not yet approved or cleared by the [place] FDA and  has been authorized for detection and/or diagnosis of SARS-CoV-2 by    FDA under an Emergency Use Authorization (EUA). This EUA will remain  in effect (meaning this test can be used) for the duration of the  Covid-19 declaration under Section 564(b)(1) of the Act, 21  U.S.C. section 360bbb-3(b)(1), unless the authorization is  terminated or revoked. Performed at [facility], [address] [place], [place] [address]   MRSA PCR Screening     Status: None   Collection Time: 08/04/19  4:07 AM   Specimen: Nasal Mucosa; Nasopharyngeal  Result Value Ref Range Status   MRSA by PCR NEGATIVE NEGATIVE Final    Comment:  The GeneXpert MRSA Assay (FDA approved for NASAL specimens only), is one component of a comprehensive MRSA colonization surveillance program. It is not intended to diagnose MRSA infection nor to guide or monitor treatment for MRSA infections. Performed at [facility], [address], [place], [place] [address]       Radiology Studies: DG HIP PORT UNILAT WITH PELVIS 1V LEFT  Result Date: 08/10/2019 CLINICAL DATA:  Left hip pain. Left hip replacement 1 day. EXAM: DG HIP (WITH OR WITHOUT PELVIS) 1V PORT LEFT COMPARISON:  Pre operative radiograph 08/03/2019 FINDINGS: Left hip arthroplasty in expected alignment. No periprosthetic lucency or fracture. Pubic rami are intact. Minor right hip osteoarthritis. Bones are diffusely under mineralized. Expected soft tissue edema laterally. No suspicious soft tissue air. IMPRESSION: Left hip arthroplasty in expected alignment. No acute findings or evidence postoperative complication. Electronically Signed   By: Narda Rutherford M.D.   On: 08/10/2019 16:19    Scheduled Meds: . amiodarone  200 mg Oral Daily  . apixaban  2.5 mg Oral BID  . Chlorhexidine Gluconate Cloth  6 each Topical Daily  . digoxin  0.125 mg Oral Daily  . ferrous sulfate  325 mg Oral Q breakfast  . insulin aspart  0-15 Units Subcutaneous TID WC  . insulin aspart  0-5 Units Subcutaneous QHS  . metoprolol succinate   50 mg Oral Daily  . senna  1 tablet Oral BID  . sertraline  100 mg Oral Daily   Continuous Infusions: . methocarbamol (ROBAXIN) IV       LOS: 7 days   Time spent: 26 minutes   Hughie Closs, MD [facility]  08/11/2019, 10:35 AM   To contact the attending provider between 7A-7P or the covering provider during after hours 7P-7A, please log into the web site [email or web address].

## 2019-08-12 LAB — GLUCOSE, CAPILLARY
Glucose-Capillary: 86 mg/dL (ref 70–99)
Glucose-Capillary: 90 mg/dL (ref 70–99)
Glucose-Capillary: 109 mg/dL — ABNORMAL HIGH (ref 70–99)

## 2019-08-12 MED ORDER — METOPROLOL SUCCINATE ER 50 MG PO TB24: 50.0000 mg | ORAL_TABLET | Freq: Every day | ORAL | 0 refills | Status: DC

## 2019-08-12 MED ORDER — TRAMADOL HCL 50 MG PO TABS: 50.0000 mg | ORAL_TABLET | Freq: Four times a day (QID) | ORAL | 0 refills | Status: DC | PRN

## 2019-08-12 NOTE — Progress Notes (Signed)
Orders received to discharge patient.  Telemetry monitor removed and CCMD notified.  PIV access removed.  Discharge instructions, follow up, medications and instructions for their use discussed with patient. 

## 2019-08-12 NOTE — Discharge Summary (Signed)
Physician Discharge Summary  [name] ZOX:[id number] DOB: December 17, 1930 DOA: 08/03/2019  PCP: [name], MD  Admit date: 08/03/2019 Discharge date: 08/12/2019  Admitted From: Home Disposition: Home  Recommendations for Outpatient Follow-up:  1. Follow up with PCP in 1-2 weeks 2. Follow-up with orthopedics in 2 weeks 3. Please obtain BMP/CBC in one week 4. Please follow up on the following pending results:  Home Health: Yes Equipment/Devices: 3 in 1 commode  Discharge Condition: Stable CODE STATUS: DNR Diet recommendation: Cardiac  Subjective: Seen and examined this morning.  Husband at the bedside.  Patient feels much better.  Pain controlled.  No shortness of breath, chest pain or palpitation.  Would not like to go to SNF and instead would like to go home.  Brief/Interim Summary: [name] an 86 y.o.femalepast medical history significant for nonischemic cardiomyopathy, CHF with an EF of 20 to 25% 2019, A. fib on Eliquis, irritable bowel syndrome, depression admitted April 25 after a mechanical fall with left hip fracture.  She was admitted under hospitalist service.  Orthopedics was consulted.  Due to A. fib with rapid heart rate, cardiology was consulted for orthopedic recommendations.  Patient underwent left hemiarthroplasty by orthopedics on 08/05/2019.  Patient then developed A. fib with RVR on the evening of 08/06/2019 and was moved to progressive unit for amiodarone drip however before initiating that, patient's A. fib was controlled.  Her home oral dose of amiodarone and digoxin were continued and beta-blocker were resumed..  Seen by PT OT and they recommended CIR.  Insurance denied CIR..  I did peer to peer on 08/12/2019 but insurance denied again.  She was approved for SNF and case managers, PT as well as I personally strongly advised to go to SNF however patient remained adamant on not going to SNF and eventually decided to go home.  After this decision, I once again  talked to patient's husband and he will reiterated their decision and requested discharge home today.  She is being discharged home in stable condition.  Of note, she had AKI which has resolved.  Her CHF remained under control.  She had mild acute blood loss anemia/postoperative anemia and her hemoglobin dropped from 13.2 at the time of admission to 9.3 but she did not require any blood transfusion.  She was initiated on Keflex before admission for UTI which she completed while here.  Discharge Diagnoses:  Principal Problem:   Displaced fracture of left femoral neck (HCC) Active Problems:   Nonischemic cardiomyopathy (HCC)   ATRIAL FIBRILLATION   Depression   Acute lower UTI   IBS (irritable bowel syndrome)   Acute kidney injury (HCC)   Severe tricuspid regurgitation    Discharge Instructions  Discharge Instructions    Weight bearing as tolerated   Complete by: As directed    No hip precautions.   Laterality: left   Extremity: Lower     Allergies as of 08/12/2019      Reactions   Ace Inhibitors Cough   Warfarin Sodium Other (See Comments)   DOSE RELATED PHARMACOLOGIC EFFECT "bleed out"   Famotidine Other (See Comments)   GI upset   Codeine Other (See Comments)   sedation   Delsym [dextromethorphan Polistirex Er] Other (See Comments)   dizziness   Lactose Intolerance (gi) Diarrhea   Phenylephrine Palpitations, Other (See Comments)   Nasal spray- "likely increase in nasal congestion".    Sulfamethoxazole-trimethoprim Nausea And Vomiting   GI intolerance.      Medication List  STOP taking these medications   cephALEXin 250 MG capsule Commonly known as: Keflex     TAKE these medications   amiodarone 200 MG tablet Commonly known as: PACERONE Take 1 tablet (200 mg total) by mouth daily.   BIOTIN PO Take 1 tablet by mouth daily.   calcium carbonate 500 MG chewable tablet Commonly known as: TUMS - dosed in mg elemental calcium Chew 1 tablet by mouth 3 (three)  times daily as needed for indigestion or heartburn.   [employer] LUBRICATING/DRY EYE OP Place 1-2 drops into both eyes daily as needed (for dry eyes).   digoxin 0.125 MG tablet Commonly known as: LANOXIN Take 1 tablet (0.125 mg total) by mouth daily.   Eliquis 2.5 MG Tabs tablet Generic drug: apixaban TAKE 1 TABLET BY MOUTH TWICE A DAY What changed: how much to take   loperamide 2 MG capsule Commonly known as: IMODIUM Take 2 mg by mouth 3 (three) times daily as needed for diarrhea or loose stools.   metoprolol succinate 50 MG 24 hr tablet Commonly known as: TOPROL-XL Take 1 tablet (50 mg total) by mouth daily. Take with or immediately following a meal. Start taking on: Aug 13, 2019   OVER THE COUNTER MEDICATION Apply 1 application topically See admin instructions. Hemp Cream - apply topically to knees daily   potassium chloride SA 20 MEQ tablet Commonly known as: KLOR-CON Take 20 mEq by mouth daily.   PROBIOTIC-10 PO Take 1 tablet by mouth daily.   sertraline 50 MG tablet Commonly known as: ZOLOFT Take 2 tablets (100 mg total) by mouth daily. What changed:   how much to take  when to take this   traMADol 50 MG tablet Commonly known as: ULTRAM Take 1 tablet (50 mg total) by mouth every 6 (six) hours as needed for moderate pain.   trolamine salicylate 10 % cream Commonly known as: ASPERCREME Apply 1 application topically 2 (two) times daily as needed (knee pain).   TYLENOL 500 MG tablet Generic drug: acetaminophen Take 1,000 mg by mouth 2 (two) times daily as needed for moderate pain or headache.   Vitamin D 50 MCG (2000 UT) Caps Take 1 capsule (2,000 Units total) by mouth daily.            Durable Medical Equipment  (From admission, onward)         Start     Ordered   08/12/19 1414  For home use only DME Bedside commode  Once    Question:  Patient needs a bedside commode to treat with the following condition  Answer:  Closed right hip fracture (HCC)    08/12/19 1414           Discharge Care Instructions  (From admission, onward)         Start     Ordered   08/05/19 0000  Weight bearing as tolerated    Comments: No hip precautions.  Question Answer Comment  Laterality left   Extremity Lower      08/05/19 1909         Follow-up Information    Jodi Geralds, MD. Schedule an appointment as soon as possible for a visit in 2 weeks.   Specialty: Orthopedic Surgery Contact information: [address] [place] [address] [phone]        [name], MD Follow up in 1 week(s).   Specialty: Family Medicine Contact information: [address] [place] [address] [phone]  [name], MD .   Specialty: Cardiology Contact information: [address] [place] [address] [phone]          Allergies  Allergen Reactions  . Ace Inhibitors Cough  . Warfarin Sodium Other (See Comments)    DOSE RELATED PHARMACOLOGIC EFFECT "bleed out"  . Famotidine Other (See Comments)    GI upset  . Codeine Other (See Comments)    sedation  . Delsym [Dextromethorphan Polistirex Er] Other (See Comments)    dizziness  . Lactose Intolerance (Gi) Diarrhea  . Phenylephrine Palpitations and Other (See Comments)    Nasal spray- "likely increase in nasal congestion".   . Sulfamethoxazole-Trimethoprim Nausea And Vomiting    GI intolerance.    Consultations: Orthopedics   Procedures/Studies: DG CHEST PORT 1 VIEW  Result Date: 08/06/2019 CLINICAL DATA:  Short of breath cough EXAM: PORTABLE CHEST 1 VIEW COMPARISON:  08/03/2019 FINDINGS: Cardiac enlargement without heart failure. Increased density in the left lung base felt to be overlying heart. No evidence of infiltrate or effusion. Atherosclerotic aortic arch. IMPRESSION: Cardiac enlargement without acute abnormality. Electronically Signed   By: Marlan Palau M.D.   On: 08/06/2019 11:39   DG Chest Portable 1 View  Result Date:  08/03/2019 CLINICAL DATA:  Trauma EXAM: PORTABLE CHEST 1 VIEW COMPARISON:  April 28, 2017 FINDINGS: There is mild cardiomegaly. Aortic knob calcifications. Mildly increased interstitial markings seen throughout both lungs. No large airspace consolidation or pleural effusion. No acute osseous abnormality. IMPRESSION: Findings suggestive of interstitial edema. Electronically Signed   By: Jonna Clark M.D.   On: 08/03/2019 23:59   DG Knee Left Port  Result Date: 08/04/2019 CLINICAL DATA:  Initial evaluation for acute pain. EXAM: PORTABLE LEFT KNEE - 1-2 VIEW COMPARISON:  Prior radiograph from 11/25/2009. FINDINGS: A cemented left total knee arthroplasty in place. No periprosthetic lucency to suggest loosening or failure. No acute fracture dislocation. No joint effusion. Underlying osteopenia. No soft tissue abnormality. Scattered vascular calcifications noted about the knee. IMPRESSION: 1. No acute osseous abnormality about the left knee. 2. Left total knee arthroplasty in place without complication. 3. Osteopenia. Electronically Signed   By: Rise Mu M.D.   On: 08/04/2019 02:22   DG C-Arm 1-60 Min  Result Date: 08/05/2019 CLINICAL DATA:  Left hip replacement EXAM: OPERATIVE LEFT HIP WITH PELVIS; DG C-ARM 1-60 MIN COMPARISON:  None. FLUOROSCOPY TIME:  Radiation Exposure Index (as provided by the fluoroscopic device): Not available If the device does not provide the exposure index: Fluoroscopy Time:  39 seconds Number of Acquired Images:  2 FINDINGS: Left hip hemiarthroplasty is noted in satisfactory position. No acute bony or soft tissue abnormality is seen. IMPRESSION: Status post left hip hemiarthroplasty. Electronically Signed   By: Alcide Clever M.D.   On: 08/05/2019 19:30   ECHOCARDIOGRAM COMPLETE  Result Date: 08/05/2019    ECHOCARDIOGRAM REPORT   Patient Name:   NATSUKO KELSAY Date of Exam: 08/05/2019 Medical Rec #:  [id number]       Height:       65.0 in Accession #:    [id number]       Weight:       160.0 lb Date of Birth:  1930/06/09       BSA:          1.799 m Patient Age:    86 years        BP:           132/78 mmHg Patient Gender: F  HR:           76 bpm. Exam Location:  Inpatient Procedure: 2D Echo, Cardiac Doppler and Color Doppler Indications:    Congestive heart failure 428.0/I50.9  History:        Patient has prior history of Echocardiogram examinations, most                 recent 08/03/2017. Arrythmias:LBBB and Atrial Fibrillation.                 Non-ischemic cardiomyopathy.  Sonographer:    Ross Ludwig RDCS (AE) Referring Phys: [id number] JESSICA U [facility]  Sonographer Comments: Technically difficult study due to poor echo windows. IMPRESSIONS  1. Left ventricular ejection fraction, by estimation, is 35%. The left ventricle has moderate to severely decreased function. The left ventricle demonstrates global hypokinesis. There is moderate left ventricular hypertrophy. Left ventricular diastolic parameters are indeterminate. There is the interventricular septum is flattened in diastole ('D' shaped left ventricle), consistent with right ventricular volume overload.  2. Right ventricular systolic function is moderately reduced. The right ventricular size is moderately enlarged. There is severely elevated pulmonary artery systolic pressure. The estimated right ventricular systolic pressure is 83.2 mmHg.  3. Left atrial size was mildly dilated.  4. Right atrial size was severely dilated.  5. The mitral valve is normal in structure. No evidence of mitral valve regurgitation. No evidence of mitral stenosis.  6. The tricuspid valve is degenerative. Tricuspid valve regurgitation is severe.  7. The aortic valve is grossly normal. Aortic valve regurgitation is not visualized. No aortic stenosis is present.  8. The inferior vena cava is dilated in size with <50% respiratory variability, suggesting right atrial pressure of 15 mmHg. Comparison(s): A prior study was performed on 08/03/2017. Prior  images reviewed side by side. RV size has increased and systolic function decreased. TR has increased from mild-moderate to severe. LV function appears slightly improved. FINDINGS  Left Ventricle: Left ventricular ejection fraction, by estimation, is 35%. The left ventricle has moderate to severely decreased function. The left ventricle demonstrates global hypokinesis. The left ventricular internal cavity size was normal in size. There is moderate left ventricular hypertrophy. Abnormal (paradoxical) septal motion, consistent with left bundle branch block and the interventricular septum is flattened in diastole ('D' shaped left ventricle), consistent with right ventricular volume overload. Left ventricular diastolic parameters are indeterminate. Right Ventricle: The right ventricular size is moderately enlarged. No increase in right ventricular wall thickness. Right ventricular systolic function is moderately reduced. There is severely elevated pulmonary artery systolic pressure. The tricuspid regurgitant velocity is 4.13 m/s, and with an assumed right atrial pressure of 15 mmHg, the estimated right ventricular systolic pressure is 83.2 mmHg. Left Atrium: Left atrial size was mildly dilated. Right Atrium: Right atrial size was severely dilated. Pericardium: Trivial pericardial effusion is present. Mitral Valve: The mitral valve is normal in structure. Normal mobility of the mitral valve leaflets. No evidence of mitral valve regurgitation. No evidence of mitral valve stenosis. Tricuspid Valve: The tricuspid valve is degenerative in appearance. Tricuspid valve regurgitation is severe. No evidence of tricuspid stenosis. Aortic Valve: The aortic valve is grossly normal.. There is mild thickening and mild calcification of the aortic valve. Aortic valve regurgitation is not visualized. No aortic stenosis is present. There is mild thickening of the aortic valve. There is mild calcification of the aortic valve. Pulmonic  Valve: The pulmonic valve was normal in structure. Pulmonic valve regurgitation is trivial. No evidence of pulmonic stenosis. Aorta: The  aortic root and ascending aorta are structurally normal, with no evidence of dilitation. Venous: The inferior vena cava is dilated in size with less than 50% respiratory variability, suggesting right atrial pressure of 15 mmHg. IAS/Shunts: No atrial level shunt detected by color flow Doppler.  LEFT VENTRICLE PLAX 2D LVIDd:         3.41 cm LVIDs:         2.92 cm LV PW:         1.85 cm LV IVS:        1.40 cm LVOT diam:     1.90 cm LV SV:         24 LV SV Index:   14 LVOT Area:     2.84 cm  LV Volumes (MOD) LV vol d, MOD A2C: 56.2 ml LV vol d, MOD A4C: 87.7 ml LV vol s, MOD A2C: 40.4 ml LV vol s, MOD A4C: 59.2 ml LV SV MOD A2C:     15.8 ml LV SV MOD A4C:     87.7 ml LV SV MOD BP:      23.5 ml RIGHT VENTRICLE            IVC RV Basal diam:  4.46 cm    IVC diam: 2.31 cm RV Mid diam:    4.36 cm RV S prime:     6.46 cm/s TAPSE (M-mode): 1.0 cm LEFT ATRIUM           Index       RIGHT ATRIUM           Index LA diam:      4.00 cm 2.22 cm/m  RA Area:     26.10 cm LA Vol (A4C): 55.5 ml 30.85 ml/m RA Volume:   83.30 ml  46.30 ml/m  AORTIC VALVE LVOT Vmax:   55.92 cm/s LVOT Vmean:  38.420 cm/s LVOT VTI:    0.086 m  AORTA Ao Root diam: 3.50 cm Ao Asc diam:  3.60 cm TRICUSPID VALVE TR Peak grad:   68.2 mmHg TR Vmax:        413.00 cm/s  SHUNTS Systemic VTI:  0.09 m Systemic Diam: 1.90 cm Weston Brass MD Electronically signed by Weston Brass MD Signature Date/Time: 08/05/2019/5:02:38 PM    Final    DG HIP PORT UNILAT WITH PELVIS 1V LEFT  Result Date: 08/10/2019 CLINICAL DATA:  Left hip pain. Left hip replacement 1 day. EXAM: DG HIP (WITH OR WITHOUT PELVIS) 1V PORT LEFT COMPARISON:  Pre operative radiograph 08/03/2019 FINDINGS: Left hip arthroplasty in expected alignment. No periprosthetic lucency or fracture. Pubic rami are intact. Minor right hip osteoarthritis. Bones are diffusely  under mineralized. Expected soft tissue edema laterally. No suspicious soft tissue air. IMPRESSION: Left hip arthroplasty in expected alignment. No acute findings or evidence postoperative complication. Electronically Signed   By: Narda Rutherford M.D.   On: 08/10/2019 16:19   DG HIP OPERATIVE UNILAT W OR W/O PELVIS LEFT  Result Date: 08/05/2019 CLINICAL DATA:  Left hip replacement EXAM: OPERATIVE LEFT HIP WITH PELVIS; DG C-ARM 1-60 MIN COMPARISON:  None. FLUOROSCOPY TIME:  Radiation Exposure Index (as provided by the fluoroscopic device): Not available If the device does not provide the exposure index: Fluoroscopy Time:  39 seconds Number of Acquired Images:  2 FINDINGS: Left hip hemiarthroplasty is noted in satisfactory position. No acute bony or soft tissue abnormality is seen. IMPRESSION: Status post left hip hemiarthroplasty. Electronically Signed   By: Alcide Clever M.D.   On: 08/05/2019  19:30   DG Hip Unilat With Pelvis 2-3 Views Left  Result Date: 08/03/2019 CLINICAL DATA:  86 year old female with fall and left hip deformity. EXAM: DG HIP (WITH OR WITHOUT PELVIS) 2-3V LEFT COMPARISON:  Left knee radiograph dated 08/03/2019. FINDINGS: There is a fracture of the left femoral neck. Evaluation however is limited due to advanced osteopenia. CT may provide better evaluation. There is no dislocation. The soft tissues are unremarkable. IMPRESSION: Fracture of the left femoral neck. Electronically Signed   By: Elgie Collard M.D.   On: 08/03/2019 23:25   DG Femur Portable 1 View Left  Result Date: 08/03/2019 CLINICAL DATA:  Larey Seat EXAM: LEFT FEMUR PORTABLE 1 VIEW COMPARISON:  None. FINDINGS: Two frontal views of the left femur are obtained. Findings are suspicious for a left femoral neck fracture with impaction, though portable technique and body habitus severely limits the evaluation. Dedicated views of the left hip and pelvis are recommended. The distal aspect of the left femur is excluded by  collimation. Femoral component of a left knee arthroplasty is partially visualized. IMPRESSION: 1. Findings suspicious for subcapital left femoral neck fracture, though images are nondiagnostic due to body habitus and technique. Dedicated views of the pelvis and left hip are recommended. Electronically Signed   By: Sharlet Salina M.D.   On: 08/03/2019 22:51      Discharge Exam: Vitals:   08/12/19 0825 08/12/19 1132  BP:  126/67  Pulse: 85 90  Resp:    Temp: 97.6 F (36.4 C) 97.9 F (36.6 C)  SpO2:  97%   Vitals:   08/12/19 0413 08/12/19 0727 08/12/19 0825 08/12/19 1132  BP: 126/71 133/77  126/67  Pulse: 94 78 85 90  Resp: 20 18    Temp: 98.2 F (36.8 C) 98 F (36.7 C) 97.6 F (36.4 C) 97.9 F (36.6 C)  TempSrc: Oral Oral Oral Oral  SpO2: 100% 97%  97%  Weight: 75 kg     Height:        General: Pt is alert, awake, not in acute distress Cardiovascular: RRR, S1/S2 +, no rubs, no gallops Respiratory: CTA bilaterally, no wheezing, no rhonchi Abdominal: Soft, NT, ND, bowel sounds + Extremities: no edema, no cyanosis    The results of significant diagnostics from this hospitalization (including imaging, microbiology, ancillary and laboratory) are listed below for reference.     Microbiology: Recent Results (from the past 240 hour(s))  Respiratory Panel by RT PCR (Flu A&B, Covid) - Nasopharyngeal Swab     Status: None   Collection Time: 08/03/19 10:16 PM   Specimen: Nasopharyngeal Swab  Result Value Ref Range Status   SARS Coronavirus 2 by RT PCR NEGATIVE NEGATIVE Final    Comment: (NOTE) SARS-CoV-2 target nucleic acids are NOT DETECTED. The SARS-CoV-2 RNA is generally detectable in upper respiratoy specimens during the acute phase of infection. The lowest concentration of SARS-CoV-2 viral copies this assay can detect is 131 copies/mL. A negative result does not preclude SARS-Cov-2 infection and should not be used as the sole basis for treatment or other patient  management decisions. A negative result may occur with  improper specimen collection/handling, submission of specimen other than nasopharyngeal swab, presence of viral mutation(s) within the areas targeted by this assay, and inadequate number of viral copies (<131 copies/mL). A negative result must be combined with clinical observations, patient history, and epidemiological information. The expected result is Negative. Fact Sheet for Patients:  https://www.moore.com/ Fact Sheet for Healthcare Providers:  https://www.young.biz/ This test is not  yet ap proved or cleared by the [place] and  has been authorized for detection and/or diagnosis of SARS-CoV-2 by FDA under an Emergency Use Authorization (EUA). This EUA will remain  in effect (meaning this test can be used) for the duration of the COVID-19 declaration under Section 564(b)(1) of the Act, 21 U.S.C. section 360bbb-3(b)(1), unless the authorization is terminated or revoked sooner.    Influenza A by PCR NEGATIVE NEGATIVE Final   Influenza B by PCR NEGATIVE NEGATIVE Final    Comment: (NOTE) The Xpert Xpress SARS-CoV-2/FLU/RSV assay is intended as an aid in  the diagnosis of influenza from Nasopharyngeal swab specimens and  should not be used as a sole basis for treatment. Nasal washings and  aspirates are unacceptable for Xpert Xpress SARS-CoV-2/FLU/RSV  testing. Fact Sheet for Patients: https://www.moore.com/ Fact Sheet for Healthcare Providers: https://www.young.biz/ This test is not yet approved or cleared by the [place] FDA and  has been authorized for detection and/or diagnosis of SARS-CoV-2 by  FDA under an Emergency Use Authorization (EUA). This EUA will remain  in effect (meaning this test can be used) for the duration of the  Covid-19 declaration under Section 564(b)(1) of the Act, 21  U.S.C. section 360bbb-3(b)(1), unless the  authorization is  terminated or revoked. Performed at [facility], [address] [place], [place] [address]   MRSA PCR Screening     Status: None   Collection Time: 08/04/19  4:07 AM   Specimen: Nasal Mucosa; Nasopharyngeal  Result Value Ref Range Status   MRSA by PCR NEGATIVE NEGATIVE Final    Comment:        The GeneXpert MRSA Assay (FDA approved for NASAL specimens only), is one component of a comprehensive MRSA colonization surveillance program. It is not intended to diagnose MRSA infection nor to guide or monitor treatment for MRSA infections. Performed at [facility], [address] [place], [place] [address]      Labs: BNP (last 3 results) No results for input(s): BNP in the last 8760 hours. Basic Metabolic Panel: Recent Labs  Lab 08/06/19 0439 08/07/19 0259 08/08/19 0156 08/11/19 0425  NA 132* 135 135 134*  K 4.6 3.8 3.8 4.1  CL 97* 99 99 100  CO2 GLUCOSE 151* 111* 108* 106*  BUN CREATININE 1.13* 0.72 0.76 0.72  CALCIUM 8.7* 9.4 8.8* 8.6*   Liver Function Tests: No results for input(s): AST, ALT, ALKPHOS, BILITOT, PROT, ALBUMIN in the last 168 hours. No results for input(s): LIPASE, AMYLASE in the last 168 hours. No results for input(s): AMMONIA in the last 168 hours. CBC: Recent Labs  Lab 08/06/19 0439 08/07/19 0259 08/08/19 0156 08/09/19 0221 08/11/19 0425  WBC 9.6 10.6* 12.3* 9.9 10.5  NEUTROABS  --   --   --  6.4 7.5  HGB 11.2* 9.3* 9.9* 9.6* 9.3*  HCT 34.4* 29.3* 30.1* 29.7* 28.4*  MCV 92.2 91.6 92.9 92.5 92.2  PLT 172 180 227 232 291   Cardiac Enzymes: No results for input(s): CKTOTAL, CKMB, CKMBINDEX, TROPONINI in the last 168 hours. BNP: Invalid input(s): POCBNP CBG: Recent Labs  Lab 08/11/19 1113 08/11/19 1617 08/11/19 2130 08/12/19 0612 08/12/19 1141  GLUCAP 115* 125* 105* 90 109*   D-Dimer No results for input(s): DDIMER in the last 72 hours. Hgb A1c No results for input(s):  HGBA1C in the last 72 hours. Lipid Profile No results for input(s): CHOL, HDL, LDLCALC, TRIG,  CHOLHDL, LDLDIRECT in the last 72 hours. Thyroid function studies No results for input(s): TSH, T4TOTAL, T3FREE, THYROIDAB in the last 72 hours.  Invalid input(s): FREET3 Anemia work up No results for input(s): VITAMINB12, FOLATE, FERRITIN, TIBC, IRON, RETICCTPCT in the last 72 hours. Urinalysis    Component Value Date/Time   COLORURINE YELLOW 04/19/2017 1027   APPEARANCEUR CLEAR 04/19/2017 1027   LABSPEC 1.006 04/19/2017 1027   PHURINE 6.0 04/19/2017 1027   GLUCOSEU NEGATIVE 04/19/2017 1027   HGBUR NEGATIVE 04/19/2017 1027   HGBUR moderate 06/01/2010 1102   BILIRUBINUR Neg 08/01/2019 0949   KETONESUR NEGATIVE 04/19/2017 1027   PROTEINUR Negative 08/01/2019 0949   PROTEINUR NEGATIVE 04/19/2017 1027   UROBILINOGEN 0.2 08/01/2019 0949   UROBILINOGEN 0.2 06/01/2010 1102   NITRITE Positive 08/01/2019 0949   NITRITE NEGATIVE 04/19/2017 1027   LEUKOCYTESUR Small (1+) (A) 08/01/2019 0949   Sepsis Labs Invalid input(s): PROCALCITONIN,  WBC,  LACTICIDVEN Microbiology Recent Results (from the past 240 hour(s))  Respiratory Panel by RT PCR (Flu A&B, Covid) - Nasopharyngeal Swab     Status: None   Collection Time: 08/03/19 10:16 PM   Specimen: Nasopharyngeal Swab  Result Value Ref Range Status   SARS Coronavirus 2 by RT PCR NEGATIVE NEGATIVE Final    Comment: (NOTE) SARS-CoV-2 target nucleic acids are NOT DETECTED. The SARS-CoV-2 RNA is generally detectable in upper respiratoy specimens during the acute phase of infection. The lowest concentration of SARS-CoV-2 viral copies this assay can detect is 131 copies/mL. A negative result does not preclude SARS-Cov-2 infection and should not be used as the sole basis for treatment or other patient management decisions. A negative result may occur with  improper specimen collection/handling, submission of specimen other than nasopharyngeal swab,  presence of viral mutation(s) within the areas targeted by this assay, and inadequate number of viral copies (<131 copies/mL). A negative result must be combined with clinical observations, patient history, and epidemiological information. The expected result is Negative. Fact Sheet for Patients:  https://www.moore.com/ Fact Sheet for Healthcare Providers:  https://www.young.biz/ This test is not yet ap proved or cleared by the [place] FDA and  has been authorized for detection and/or diagnosis of SARS-CoV-2 by FDA under an Emergency Use Authorization (EUA). This EUA will remain  in effect (meaning this test can be used) for the duration of the COVID-19 declaration under Section 564(b)(1) of the Act, 21 U.S.C. section 360bbb-3(b)(1), unless the authorization is terminated or revoked sooner.    Influenza A by PCR NEGATIVE NEGATIVE Final   Influenza B by PCR NEGATIVE NEGATIVE Final    Comment: (NOTE) The Xpert Xpress SARS-CoV-2/FLU/RSV assay is intended as an aid in  the diagnosis of influenza from Nasopharyngeal swab specimens and  should not be used as a sole basis for treatment. Nasal washings and  aspirates are unacceptable for Xpert Xpress SARS-CoV-2/FLU/RSV  testing. Fact Sheet for Patients: https://www.moore.com/ Fact Sheet for Healthcare Providers: https://www.young.biz/ This test is not yet approved or cleared by the [place] FDA and  has been authorized for detection and/or diagnosis of SARS-CoV-2 by  FDA under an Emergency Use Authorization (EUA). This EUA will remain  in effect (meaning this test can be used) for the duration of the  Covid-19 declaration under Section 564(b)(1) of the Act, 21  U.S.C. section 360bbb-3(b)(1), unless the authorization is  terminated or revoked. Performed at [facility], [address], [place], [place] [address]   MRSA PCR Screening     Status:  None  Collection Time: 08/04/19  4:07 AM   Specimen: Nasal Mucosa; Nasopharyngeal  Result Value Ref Range Status   MRSA by PCR NEGATIVE NEGATIVE Final    Comment:        The GeneXpert MRSA Assay (FDA approved for NASAL specimens only), is one component of a comprehensive MRSA colonization surveillance program. It is not intended to diagnose MRSA infection nor to guide or monitor treatment for MRSA infections. Performed at [facility], [place] [address] [place], [place] [address]      Time coordinating discharge: Over 30 minutes  SIGNED:   Darliss Cheney, MD  [facility] 08/12/2019, 2:17 PM  If 7PM-7AM, please contact night-coverage [email or web address]

## 2019-08-12 NOTE — Progress Notes (Signed)
PROGRESS NOTE    [name]  PYK:[id number] DOB: 1930/11/30 DOA: 08/03/2019 PCP: [name], MD   Brief Narrative:  [name] is an 86 y.o. female past medical history significant for nonischemic cardiomyopathy, CHF with an EF of 20 to 25% 2019, A. fib on Eliquis, irritable bowel syndrome, depression admitted April 25 after a mechanical fall with left hip fracture.  She was admitted under hospitalist service.  Orthopedics was consulted.  Due to A. fib with rapid heart rate, cardiology was consulted for orthopedic recommendations.  Patient underwent left hemiarthroplasty by orthopedics on 08/05/2019.  Patient then developed A. fib with RVR on the evening of 08/06/2019 and was moved to progressive unit.  Seen by PT OT and they recommended CIR.  Insurance denied CIR..  I did peer to peer on 08/12/2019 but insurance denied again.  Assessment & Plan:   Principal Problem:   Displaced fracture of left femoral neck (HCC) Active Problems:   Nonischemic cardiomyopathy (HCC)   ATRIAL FIBRILLATION   Depression   Acute lower UTI   IBS (irritable bowel syndrome)   Acute kidney injury (HCC)   Severe tricuspid regurgitation  #1.  Left hip fracture.  Mechanical fall.  X-ray reveals fracture of the left femoral neck. underwent left hemiarthroplasty on 08/05/2019.  Due to persistent pain, repeat x-ray hip obtained which did not show any pathology.  PT OT recommended CIR however insurance denied approval.  Peer to peer failed as well.  We highly recommend this patient to go to SNF.  Currently she is a little resistant but wants to talk to the family.  Appreciate orthopedics help.  #2.  Acute kidney injury.  Creatinine 1.2> 1.13> 0.72. Resolved.    #3.  Atrial fibrillation with a left bundle branch block.  Was having intermittent elevations of her heart rate at admission.  Home medications include amiodarone and dig.  She was evaluated by cardiology for risk stratification who recommended  resuming beta-blocker that had recently been discontinued due to increased fatigue and dizziness.  Heart rates have been for most part since last 3 to 4 days.  -Continue beta-blocker (switched to Toprol-XL) -Continue amiodarone -Continue dig -Continue Eliquis. -Monitor  #4.  Combined CHF.  Does not appear overloaded.  Last echo 2019 with an EF of 20%.  Evaluated by cardiology as noted above.  Cardiology recommends judicious management of IV fluids -Monitor intake and output -Attain daily weights -Medications as noted above  #5.  Acute blood loss anemia.  Hemoglobin dropped from 13.2-11.2> 9.3> 9> 9.6> 9.3.  Postoperative anemia.  No signs of active bleeding.  No indication of transfusion.  Continue to monitor daily.  #6.  Recent urinary tract infection.  Antibiotics started April 22.  Patient is afebrile hemodynamically stable and nontoxic-appearing -Completed Keflex on 08/07/2019.  DVT prophylaxis: Eliquis   Code Status: DNR  Family Communication: Husband present at bedside.  Plan of care discussed with patient in length and he verbalized understanding and agreed with it. Patient is from: Home Disposition Plan: PT recommends CIR. Barriers to discharge: Pending insurance authorization for discharge to CIR  Status is: Inpatient  Remains inpatient appropriate because:Unsafe d/c plan   Dispo: The patient is from: Home              Anticipated d/c is to: SNF vs HOME, likely in 1 to 2 days              Anticipated d/c date is: 1 to 2 days  Patient currently Is medically stable for DC         Estimated body mass index is 27.51 kg/m as calculated from the following:   Height as of this encounter: 5\' 5"  (1.651 m).   Weight as of this encounter: 75 kg.      Nutritional status:               Consultants:   Orthopedics  Procedures:   Left hemiarthroplasty 08/05/2019  Antimicrobials:  Anti-infectives (From admission, onward)   Start     Dose/Rate  Route Frequency Ordered Stop   08/05/19 2230  ceFAZolin (ANCEF) IVPB 2g/100 mL premix     2 g 200 mL/hr over 30 Minutes Intravenous Every 6 hours 08/05/19 2042 08/06/19 0428   08/05/19 0800  ceFAZolin (ANCEF) IVPB 2g/100 mL premix     2 g 200 mL/hr over 30 Minutes Intravenous To [facility] 08/04/19 2229 08/05/19 1713   08/04/19 0200  cephALEXin (KEFLEX) capsule 250 mg     250 mg Oral 2 times daily 08/04/19 0127 08/07/19 2359         Subjective: Seen and examined.  Husband at the bedside.  Feels much better.  Left hip pain is improving.  No shortness of breath or chest pain or palpitation.  Still not ready to make a decision about SNF.  Objective: Vitals:   08/12/19 0413 08/12/19 0727 08/12/19 0825 08/12/19 1132  BP: 126/71 133/77  126/67  Pulse: 94 78 85 90  Resp: 20 18    Temp: 98.2 F (36.8 C) 98 F (36.7 C) 97.6 F (36.4 C) 97.9 F (36.6 C)  TempSrc: Oral Oral Oral Oral  SpO2: 100% 97%  97%  Weight: 75 kg     Height:        Intake/Output Summary (Last 24 hours) at 08/12/2019 1239 Last data filed at 08/12/2019 0644 Gross per 24 hour  Intake 240 ml  Output 850 ml  Net -610 ml   Filed Weights   08/10/19 0331 08/11/19 0633 08/12/19 0413  Weight: 75.1 kg 75.7 kg 75 kg    Examination:  General exam: Appears calm and comfortable  Respiratory system: Clear to auscultation. Respiratory effort normal. Cardiovascular system: S1 & S2 heard, irregularly irregular rate and rhythm. No JVD, murmurs, rubs, gallops or clicks. No pedal edema. Gastrointestinal system: Abdomen is nondistended, soft and nontender. No organomegaly or masses felt. Normal bowel sounds heard. Central nervous system: Alert and oriented. No focal neurological deficits. Extremities: Symmetric 5 x 5 power. Skin: No rashes, lesions or ulcers.  Psychiatry: Judgement and insight appear normal. Mood & affect appropriate.   Data Reviewed: I have personally reviewed following labs and imaging  studies  CBC: Recent Labs  Lab 08/06/19 0439 08/07/19 0259 08/08/19 0156 08/09/19 0221 08/11/19 0425  WBC 9.6 10.6* 12.3* 9.9 10.5  NEUTROABS  --   --   --  6.4 7.5  HGB 11.2* 9.3* 9.9* 9.6* 9.3*  HCT 34.4* 29.3* 30.1* 29.7* 28.4*  MCV 92.2 91.6 92.9 92.5 92.2  PLT 172 180 227 232 291   Basic Metabolic Panel: Recent Labs  Lab 08/06/19 0439 08/07/19 0259 08/08/19 0156 08/11/19 0425  NA 132* 135 135 134*  K 4.6 3.8 3.8 4.1  CL 97* 99 99 100  CO2 26 22 27 26   GLUCOSE 151* 111* 108* 106*  BUN 19 17 13 10   CREATININE 1.13* 0.72 0.76 0.72  CALCIUM 8.7* 9.4 8.8* 8.6*   GFR: Estimated Creatinine Clearance: 51.2 mL/min (by  C-G formula based on SCr of 0.72 mg/dL). Liver Function Tests: No results for input(s): AST, ALT, ALKPHOS, BILITOT, PROT, ALBUMIN in the last 168 hours. No results for input(s): LIPASE, AMYLASE in the last 168 hours. No results for input(s): AMMONIA in the last 168 hours. Coagulation Profile: No results for input(s): INR, PROTIME in the last 168 hours. Cardiac Enzymes: No results for input(s): CKTOTAL, CKMB, CKMBINDEX, TROPONINI in the last 168 hours. BNP (last 3 results) No results for input(s): PROBNP in the last 8760 hours. HbA1C: No results for input(s): HGBA1C in the last 72 hours. CBG: Recent Labs  Lab 08/11/19 1113 08/11/19 1617 08/11/19 2130 08/12/19 0612 08/12/19 1141  GLUCAP 115* 125* 105* 90 109*   Lipid Profile: No results for input(s): CHOL, HDL, LDLCALC, TRIG, CHOLHDL, LDLDIRECT in the last 72 hours. Thyroid Function Tests: No results for input(s): TSH, T4TOTAL, FREET4, T3FREE, THYROIDAB in the last 72 hours. Anemia Panel: No results for input(s): VITAMINB12, FOLATE, FERRITIN, TIBC, IRON, RETICCTPCT in the last 72 hours. Sepsis Labs: No results for input(s): PROCALCITON, LATICACIDVEN in the last 168 hours.  Recent Results (from the past 240 hour(s))  Respiratory Panel by RT PCR (Flu A&B, Covid) - Nasopharyngeal Swab      Status: None   Collection Time: 08/03/19 10:16 PM   Specimen: Nasopharyngeal Swab  Result Value Ref Range Status   SARS Coronavirus 2 by RT PCR NEGATIVE NEGATIVE Final    Comment: (NOTE) SARS-CoV-2 target nucleic acids are NOT DETECTED. The SARS-CoV-2 RNA is generally detectable in upper respiratoy specimens during the acute phase of infection. The lowest concentration of SARS-CoV-2 viral copies this assay can detect is 131 copies/mL. A negative result does not preclude SARS-Cov-2 infection and should not be used as the sole basis for treatment or other patient management decisions. A negative result may occur with  improper specimen collection/handling, submission of specimen other than nasopharyngeal swab, presence of viral mutation(s) within the areas targeted by this assay, and inadequate number of viral copies (<131 copies/mL). A negative result must be combined with clinical observations, patient history, and epidemiological information. The expected result is Negative. Fact Sheet for Patients:  PinkCheek.be Fact Sheet for Healthcare Providers:  GravelBags.it This test is not yet ap proved or cleared by the [place] FDA and  has been authorized for detection and/or diagnosis of SARS-CoV-2 by FDA under an Emergency Use Authorization (EUA). This EUA will remain  in effect (meaning this test can be used) for the duration of the COVID-19 declaration under Section 564(b)(1) of the Act, 21 U.S.C. section 360bbb-3(b)(1), unless the authorization is terminated or revoked sooner.    Influenza A by PCR NEGATIVE NEGATIVE Final   Influenza B by PCR NEGATIVE NEGATIVE Final    Comment: (NOTE) The Xpert Xpress SARS-CoV-2/FLU/RSV assay is intended as an aid in  the diagnosis of influenza from Nasopharyngeal swab specimens and  should not be used as a sole basis for treatment. Nasal washings and  aspirates are unacceptable for  Xpert Xpress SARS-CoV-2/FLU/RSV  testing. Fact Sheet for Patients: PinkCheek.be Fact Sheet for Healthcare Providers: GravelBags.it This test is not yet approved or cleared by the [place] FDA and  has been authorized for detection and/or diagnosis of SARS-CoV-2 by  FDA under an Emergency Use Authorization (EUA). This EUA will remain  in effect (meaning this test can be used) for the duration of the  Covid-19 declaration under Section 564(b)(1) of the Act, 21  U.S.C. section 360bbb-3(b)(1), unless the authorization is  terminated or revoked. Performed at [facility], [address] [place], [place] [address]   MRSA PCR Screening     Status: None   Collection Time: 08/04/19  4:07 AM   Specimen: Nasal Mucosa; Nasopharyngeal  Result Value Ref Range Status   MRSA by PCR NEGATIVE NEGATIVE Final    Comment:        The GeneXpert MRSA Assay (FDA approved for NASAL specimens only), is one component of a comprehensive MRSA colonization surveillance program. It is not intended to diagnose MRSA infection nor to guide or monitor treatment for MRSA infections. Performed at [facility], [address], [place], [place] [address]       Radiology Studies: No results found.  Scheduled Meds: . amiodarone  200 mg Oral Daily  . apixaban  2.5 mg Oral BID  . Chlorhexidine Gluconate Cloth  6 each Topical Daily  . digoxin  0.125 mg Oral Daily  . ferrous sulfate  325 mg Oral Q breakfast  . insulin aspart  0-15 Units Subcutaneous TID WC  . insulin aspart  0-5 Units Subcutaneous QHS  . metoprolol succinate  50 mg Oral Daily  . senna  1 tablet Oral BID  . sertraline  100 mg Oral Daily   Continuous Infusions: . methocarbamol (ROBAXIN) IV       LOS: 8 days   Time spent: 28 minutes   Hughie Closs, MD [facility]  08/12/2019, 12:39 PM   To contact the attending provider between 7A-7P or the covering  provider during after hours 7P-7A, please log into the web site [email or web address].

## 2019-08-12 NOTE — Progress Notes (Signed)
Physical Therapy Treatment Patient Details Name: [name] MRN: [id number] DOB: June 29, 1930 Today's Date: 08/12/2019    History of Present Illness [name] is an 86 y.o. female past medical history significant for nonischemic cardiomyopathy, CHF with an EF of 20 to 25% 2019, A. fib on Eliquis, irritable bowel syndrome, depression, L TKA admitted April 25 after a mechanical fall with left hip fracture.    Patient underwent left hemiarthroplasty by direct anterior approach on 08/05/2019    PT Comments    Pt making steady progress. Insurance denied rehab and pt still needs more assist than husband can provide. Recommend SNF.    Follow Up Recommendations  Supervision/Assistance - 24 hour;SNF     Equipment Recommendations  3in1 (PT)    Recommendations for Other Services       Precautions / Restrictions Precautions Precautions: Fall Restrictions Weight Bearing Restrictions: Yes LLE Weight Bearing: Weight bearing as tolerated    Mobility  Bed Mobility Overal bed mobility: Needs Assistance Bed Mobility: Supine to Sit     Supine to sit: Min assist;HOB elevated     General bed mobility comments: Assist to move lt leg and elevate trunk into sitting  Transfers Overall transfer level: Needs assistance Equipment used: Rolling walker (2 wheeled) Transfers: Sit to/from Stand Sit to Stand: Min assist         General transfer comment: Assist to bring hips up and for balance. Verbal cues for hand placement  Ambulation/Gait Ambulation/Gait assistance: Min guard Gait Distance (Feet): 150 Feet Assistive device: Rolling walker (2 wheeled) Gait Pattern/deviations: Decreased stance time - left;Antalgic;Step-through pattern;Trunk flexed Gait velocity: decreased Gait velocity interpretation: <1.31 ft/sec, indicative of household ambulator General Gait Details: Assist for balance and support. Two standing rest breaks   Stairs             Wheelchair Mobility     Modified Rankin (Stroke Patients Only)       Balance Overall balance assessment: Needs assistance;History of Falls Sitting-balance support: Bilateral upper extremity supported;Feet supported Sitting balance-Leahy Scale: Fair     Standing balance support: Bilateral upper extremity supported Standing balance-Leahy Scale: Poor Standing balance comment: walker and min guard assist for static standing                            Cognition Arousal/Alertness: Awake/alert Behavior During Therapy: WFL for tasks assessed/performed Overall Cognitive Status: Within Functional Limits for tasks assessed                                        Exercises      General Comments        Pertinent Vitals/Pain Pain Assessment: Faces Faces Pain Scale: Hurts little more Pain Location: L hip Pain Descriptors / Indicators: Sore Pain Intervention(s): Limited activity within patient's tolerance;Monitored during session;Repositioned    Home Living                      Prior Function            PT Goals (current goals can now be found in the care plan section) Acute Rehab PT Goals Patient Stated Goal: rehab and then home Progress towards PT goals: Progressing toward goals    Frequency    Min 3X/week      PT Plan Discharge plan needs to be updated;Frequency needs to  be updated    Co-evaluation              AM-PAC PT "6 Clicks" Mobility   Outcome Measure  Help needed turning from your back to your side while in a flat bed without using bedrails?: A Little Help needed moving from lying on your back to sitting on the side of a flat bed without using bedrails?: A Little Help needed moving to and from a bed to a chair (including a wheelchair)?: A Little Help needed standing up from a chair using your arms (e.g., wheelchair or bedside chair)?: A Little Help needed to walk in hospital room?: A Little Help needed climbing 3-5 steps with a  railing? : Total 6 Click Score: 16    End of Session Equipment Utilized During Treatment: Gait belt Activity Tolerance: Patient tolerated treatment well Patient left: in chair;with call bell/phone within reach;with family/visitor present;with chair alarm set Nurse Communication: Mobility status PT Visit Diagnosis: Unsteadiness on feet (R26.81);Repeated falls (R29.6);Difficulty in walking, not elsewhere classified (R26.2);Pain;Muscle weakness (generalized) (M62.81) Pain - Right/Left: Left Pain - part of body: Hip     Time: 2641-5830 PT Time Calculation (min) (ACUTE ONLY): 21 min  Charges:  $Gait Training: 8-22 mins                     [place] Pager [phone] Office [place] 08/12/2019, 11:00 AM

## 2019-08-12 NOTE — TOC Transition Note (Signed)
Transition of Care [facility]) - CM/SW Discharge Note Donn Pierini RN, BSN Transitions of Care Unit 4E- RN Case Manager [phone]   Patient Details  Name: [name] MRN: [id number] Date of Birth: 1930/08/28  Transition of Care [facility]) CM/SW Contact:  Darrold Span, RN Phone Number: 08/12/2019, 3:26 PM   Clinical Narrative:    Pt stable for transition home, has been denied CIR by insurance- daughter now at the bedside- and f/u done with pt/spouse and daughter. They have decided that they do not want to pursue STSNF and would rather go home with [facility] services. List provided to pt/family for [facility] choice Per CMS guidelines from medicare.gov website with star ratings (copy placed in shadow chart)- per pt she has used [facility] in past and they would be her first choice. Have notified MD of pt and family decision and requested HH and DME orders.  Zach with [employer] called for DME- BSC need- BSC to be delivered to room prior to discharge.  Call made to Tiffany with [facility] for HHPT referral- [facility] unable to accept referral at this time due to low staffing currently and unable to do start of care until sometime next week. Informed pt and and family- alternate  Choices are [place] and [employer].  Call made to [facility] with [facility] for HHPT referral- [facility] is able to accept referral for start of care this week- pt and family informed [facility] would be contacting them.    Final next level of care: Home w Home Health Services Barriers to Discharge: Barriers Resolved   Patient Goals and CMS Choice Patient states their goals for this hospitalization and ongoing recovery are:: I would like to go home with home health if possible or inpatient therapy at [facility]. CMS Medicare.gov Compare Post Acute Care list provided to:: Patient Choice offered to / list presented to : Patient  Discharge Placement               Home with [facility]        Discharge Plan and Services In-house Referral: Clinical Social Work Discharge  Planning Services: CM Consult Post Acute Care Choice: Home Health          DME Arranged: 3-N-1 DME Agency: [employer] Date DME Agency Contacted: 08/12/19 Time DME Agency Contacted: 1415 Representative spoke with at DME Agency: Ian Malkin HH Arranged: PT HH Agency: [facility] Date [facility] Agency Contacted: 08/12/19 Time HH Agency Contacted: 1500 Representative spoke with at [facility] Agency: Kandee Keen  Social Determinants of Health (SDOH) Interventions     Readmission Risk Interventions Readmission Risk Prevention Plan 08/06/2019  Transportation Screening Complete  PCP or Specialist Appt within 5-7 Days Complete  Home Care Screening Complete  Medication Review (RN CM) Complete  Some recent data might be hidden

## 2019-08-12 NOTE — TOC Progression Note (Signed)
Transition of Care (TOC) - Progression Note  Donn Pierini RN, BSN Transitions of Care Unit 4E- RN Case Manager [phone]    Patient Details  Name: [name] MRN: [id number] Date of Birth: 07/30/1930  Transition of Care [facility]) CM/SW Contact  Zenda Alpers, Lenn Sink, RN Phone Number: 08/12/2019, 12:10 PM  Clinical Narrative:    Notified by Luther Parody with INPT rehab that insurance as denied INPT rehab and P2P has been done - with denial upheld. PT has seen pt this am and recommendation is for SNF for safest d/c plan. CM in to speak with pt and husband at bedside to discuss transition plan. Explained PT recommendation and difference between STSNF vs Home with [facility] with regards to PT follow up and how much pt would get with each. Questions answered. Patient is hesitant about going to [facility] and is stating she wants to go home with [facility]. Husband is wanting to look into STSNF to have all options available to discuss with daughter when she gets here. Asked pt about faxing out to skilled facilities to see what options there might be - she was finally agreeable to fax out to both [facility] and [facility] to see if we get any offers. FL2 completed and faxed out to both facilities - will f/u when daughter gets here for final plan. Explained to pt that should she select to go home- possibly could be discharged today with [facility] and DME needs- pt states she has RW and shower chair at home already and would only need 3n1 for home.     Expected Discharge Plan: Skilled Nursing Facility Barriers to Discharge: Continued Medical Work up  Expected Discharge Plan and Services Expected Discharge Plan: Skilled Nursing Facility   Discharge Planning Services: CM Consult Post Acute Care Choice: IP Rehab Living arrangements for the past 2 months: Single Family Home                                       Social Determinants of Health (SDOH) Interventions    Readmission Risk Interventions Readmission Risk  Prevention Plan 08/06/2019  Transportation Screening Complete  PCP or Specialist Appt within 5-7 Days Complete  Home Care Screening Complete  Medication Review (RN CM) Complete  Some recent data might be hidden

## 2019-08-12 NOTE — Progress Notes (Signed)
Inpatient Rehab Admissions Coordinator:   Met with pt and her husband at bedside to let them know that insurance has denied request for CIR.  Discussed mechanics of HHPT/OT versus SNF and pt seems more open to SNF for short term rehab.  Let Kristi Webster, RNCM know.  Will sign off for CIR at this time.   Caitlin Warren, PT, DPT Admissions Coordinator [phone] 08/12/19  12:38 PM   

## 2019-08-13 ENCOUNTER — Telehealth: Payer: Self-pay

## 2019-08-13 NOTE — Telephone Encounter (Signed)
Transition Care Management Follow-up Telephone Call  Date of discharge and from where: 08/12/2019, [facility]  How have you been since you were released from the hospital? Patient states that she is doing much better since her discharge. No complaints at this time.   Any questions or concerns? No   Items Reviewed:  Did the pt receive and understand the discharge instructions provided? Yes   Medications obtained and verified? Yes   Any new allergies since your discharge? No   Dietary orders reviewed? Yes  Do you have support at home? Yes   Functional Questionnaire: (I = Independent and D = Dependent) ADLs: I  Bathing/Dressing- I  Meal Prep- I  Eating- I  Maintaining continence- I  Transferring/Ambulation- I  Managing Meds- I  Follow up appointments reviewed:   PCP Hospital f/u appt confirmed? Patient states that she will call back at a later date to schedule a hospital follow up visit. She is unable to get around very good due to her hip fracture and wants to wait until her mobility improves.   Specialist Hospital f/u appt confirmed? Yes  Scheduled to see orthopedics  Are transportation arrangements needed? No   If their condition worsens, is the pt aware to call PCP or go to the Emergency Dept.? Yes  Was the patient provided with contact information for the PCP's office or ED? Yes  Was to pt encouraged to call back with questions or concerns? Yes

## 2019-08-13 NOTE — Telephone Encounter (Signed)
See below.  Patient declined scheduling follow-up on initial phone call.  Please check on patient in a few days, especially if she has not scheduled follow-up in the meantime.  Thanks.

## 2019-08-14 DIAGNOSIS — I5043 Acute on chronic combined systolic (congestive) and diastolic (congestive) heart failure: Secondary | ICD-10-CM | POA: Diagnosis not present

## 2019-08-14 DIAGNOSIS — I0981 Rheumatic heart failure: Secondary | ICD-10-CM | POA: Diagnosis not present

## 2019-08-14 DIAGNOSIS — I088 Other rheumatic multiple valve diseases: Secondary | ICD-10-CM | POA: Diagnosis not present

## 2019-08-14 DIAGNOSIS — S72002D Fracture of unspecified part of neck of left femur, subsequent encounter for closed fracture with routine healing: Secondary | ICD-10-CM | POA: Diagnosis not present

## 2019-08-14 DIAGNOSIS — M1611 Unilateral primary osteoarthritis, right hip: Secondary | ICD-10-CM | POA: Diagnosis not present

## 2019-08-15 ENCOUNTER — Telehealth: Payer: Self-pay | Admitting: Family Medicine

## 2019-08-15 DIAGNOSIS — M1711 Unilateral primary osteoarthritis, right knee: Secondary | ICD-10-CM | POA: Diagnosis not present

## 2019-08-15 DIAGNOSIS — S72002D Fracture of unspecified part of neck of left femur, subsequent encounter for closed fracture with routine healing: Secondary | ICD-10-CM | POA: Diagnosis not present

## 2019-08-15 NOTE — Telephone Encounter (Signed)
Patient says she has been doing fine previously but had just tried to lay down on the sofa and she must have stretched the surgical site too much and now she is in pain.  Patient is going to call Dr. Luiz Blare' office to schedule her follow up with them and then call this office to schedule her follow up with Dr. Para March.

## 2019-08-15 NOTE — Telephone Encounter (Signed)
[name], Physical Therapist with [facility] Called  She would like to get verbal orders for PT  2 times a week for 4 weeks 1 time a week for 1 week   She also wanted to let you know that when she entered the patient's medications she received medication reaction warnings   [name] C/b # [phone]

## 2019-08-16 DIAGNOSIS — S72002D Fracture of unspecified part of neck of left femur, subsequent encounter for closed fracture with routine healing: Secondary | ICD-10-CM | POA: Diagnosis not present

## 2019-08-16 DIAGNOSIS — M1611 Unilateral primary osteoarthritis, right hip: Secondary | ICD-10-CM | POA: Diagnosis not present

## 2019-08-16 DIAGNOSIS — I0981 Rheumatic heart failure: Secondary | ICD-10-CM | POA: Diagnosis not present

## 2019-08-16 DIAGNOSIS — I088 Other rheumatic multiple valve diseases: Secondary | ICD-10-CM | POA: Diagnosis not present

## 2019-08-16 DIAGNOSIS — I5043 Acute on chronic combined systolic (congestive) and diastolic (congestive) heart failure: Secondary | ICD-10-CM | POA: Diagnosis not present

## 2019-08-16 NOTE — Telephone Encounter (Signed)
Noted. Thanks.

## 2019-08-16 NOTE — Telephone Encounter (Signed)
Amy advised and these are the medication interactions she reported: Tizanidine and  Amiodarone  Level 2 Amiodarone and Metoprolol Level 2 Tramadol and  Amiodarone Level 3 Digoxin and Sertraline Level 3 Metoprolol and Amiodarone

## 2019-08-16 NOTE — Telephone Encounter (Signed)
Please give the order.  Please get update on med concerns and let me know.  Thanks.

## 2019-08-19 DIAGNOSIS — S72002D Fracture of unspecified part of neck of left femur, subsequent encounter for closed fracture with routine healing: Secondary | ICD-10-CM | POA: Diagnosis not present

## 2019-08-19 DIAGNOSIS — I088 Other rheumatic multiple valve diseases: Secondary | ICD-10-CM | POA: Diagnosis not present

## 2019-08-19 DIAGNOSIS — I5043 Acute on chronic combined systolic (congestive) and diastolic (congestive) heart failure: Secondary | ICD-10-CM | POA: Diagnosis not present

## 2019-08-19 DIAGNOSIS — I0981 Rheumatic heart failure: Secondary | ICD-10-CM | POA: Diagnosis not present

## 2019-08-19 DIAGNOSIS — M1611 Unilateral primary osteoarthritis, right hip: Secondary | ICD-10-CM | POA: Diagnosis not present

## 2019-08-19 NOTE — Telephone Encounter (Signed)
I do not think there is an issue where she has to stop any these medications but I routed this to cardiology just in case.  Thanks.

## 2019-08-21 DIAGNOSIS — I0981 Rheumatic heart failure: Secondary | ICD-10-CM | POA: Diagnosis not present

## 2019-08-21 DIAGNOSIS — M1611 Unilateral primary osteoarthritis, right hip: Secondary | ICD-10-CM | POA: Diagnosis not present

## 2019-08-21 DIAGNOSIS — I088 Other rheumatic multiple valve diseases: Secondary | ICD-10-CM | POA: Diagnosis not present

## 2019-08-21 DIAGNOSIS — I5043 Acute on chronic combined systolic (congestive) and diastolic (congestive) heart failure: Secondary | ICD-10-CM | POA: Diagnosis not present

## 2019-08-21 DIAGNOSIS — S72002D Fracture of unspecified part of neck of left femur, subsequent encounter for closed fracture with routine healing: Secondary | ICD-10-CM | POA: Diagnosis not present

## 2019-08-26 DIAGNOSIS — Z79891 Long term (current) use of opiate analgesic: Secondary | ICD-10-CM

## 2019-08-26 DIAGNOSIS — K589 Irritable bowel syndrome without diarrhea: Secondary | ICD-10-CM

## 2019-08-26 DIAGNOSIS — Z9181 History of falling: Secondary | ICD-10-CM

## 2019-08-26 DIAGNOSIS — M1711 Unilateral primary osteoarthritis, right knee: Secondary | ICD-10-CM

## 2019-08-26 DIAGNOSIS — Z96642 Presence of left artificial hip joint: Secondary | ICD-10-CM

## 2019-08-26 DIAGNOSIS — K5730 Diverticulosis of large intestine without perforation or abscess without bleeding: Secondary | ICD-10-CM

## 2019-08-26 DIAGNOSIS — Z7901 Long term (current) use of anticoagulants: Secondary | ICD-10-CM

## 2019-08-26 DIAGNOSIS — I088 Other rheumatic multiple valve diseases: Secondary | ICD-10-CM | POA: Diagnosis not present

## 2019-08-26 DIAGNOSIS — S72002D Fracture of unspecified part of neck of left femur, subsequent encounter for closed fracture with routine healing: Secondary | ICD-10-CM | POA: Diagnosis not present

## 2019-08-26 DIAGNOSIS — M5431 Sciatica, right side: Secondary | ICD-10-CM

## 2019-08-26 DIAGNOSIS — M1611 Unilateral primary osteoarthritis, right hip: Secondary | ICD-10-CM | POA: Diagnosis not present

## 2019-08-26 DIAGNOSIS — I0981 Rheumatic heart failure: Secondary | ICD-10-CM | POA: Diagnosis not present

## 2019-08-26 DIAGNOSIS — I428 Other cardiomyopathies: Secondary | ICD-10-CM

## 2019-08-26 DIAGNOSIS — I5043 Acute on chronic combined systolic (congestive) and diastolic (congestive) heart failure: Secondary | ICD-10-CM | POA: Diagnosis not present

## 2019-08-26 DIAGNOSIS — Z87440 Personal history of urinary (tract) infections: Secondary | ICD-10-CM

## 2019-08-26 DIAGNOSIS — I700 Atherosclerosis of aorta: Secondary | ICD-10-CM

## 2019-08-26 DIAGNOSIS — Z96652 Presence of left artificial knee joint: Secondary | ICD-10-CM

## 2019-08-26 DIAGNOSIS — M810 Age-related osteoporosis without current pathological fracture: Secondary | ICD-10-CM

## 2019-08-26 DIAGNOSIS — G4700 Insomnia, unspecified: Secondary | ICD-10-CM

## 2019-08-26 DIAGNOSIS — I4891 Unspecified atrial fibrillation: Secondary | ICD-10-CM

## 2019-08-26 DIAGNOSIS — F329 Major depressive disorder, single episode, unspecified: Secondary | ICD-10-CM

## 2019-08-27 ENCOUNTER — Ambulatory Visit: Payer: Medicare Other | Admitting: Family Medicine

## 2019-08-28 DIAGNOSIS — S72002D Fracture of unspecified part of neck of left femur, subsequent encounter for closed fracture with routine healing: Secondary | ICD-10-CM | POA: Diagnosis not present

## 2019-08-28 DIAGNOSIS — I088 Other rheumatic multiple valve diseases: Secondary | ICD-10-CM | POA: Diagnosis not present

## 2019-08-28 DIAGNOSIS — I5043 Acute on chronic combined systolic (congestive) and diastolic (congestive) heart failure: Secondary | ICD-10-CM | POA: Diagnosis not present

## 2019-08-28 DIAGNOSIS — M1611 Unilateral primary osteoarthritis, right hip: Secondary | ICD-10-CM | POA: Diagnosis not present

## 2019-08-28 DIAGNOSIS — I0981 Rheumatic heart failure: Secondary | ICD-10-CM | POA: Diagnosis not present

## 2019-08-30 ENCOUNTER — Encounter (HOSPITAL_COMMUNITY): Payer: Medicare Other | Admitting: Cardiology

## 2019-09-02 DIAGNOSIS — M1611 Unilateral primary osteoarthritis, right hip: Secondary | ICD-10-CM | POA: Diagnosis not present

## 2019-09-02 DIAGNOSIS — I5043 Acute on chronic combined systolic (congestive) and diastolic (congestive) heart failure: Secondary | ICD-10-CM | POA: Diagnosis not present

## 2019-09-02 DIAGNOSIS — I088 Other rheumatic multiple valve diseases: Secondary | ICD-10-CM | POA: Diagnosis not present

## 2019-09-02 DIAGNOSIS — S72002D Fracture of unspecified part of neck of left femur, subsequent encounter for closed fracture with routine healing: Secondary | ICD-10-CM | POA: Diagnosis not present

## 2019-09-02 DIAGNOSIS — I0981 Rheumatic heart failure: Secondary | ICD-10-CM | POA: Diagnosis not present

## 2019-09-04 DIAGNOSIS — S72002D Fracture of unspecified part of neck of left femur, subsequent encounter for closed fracture with routine healing: Secondary | ICD-10-CM | POA: Diagnosis not present

## 2019-09-04 DIAGNOSIS — M1611 Unilateral primary osteoarthritis, right hip: Secondary | ICD-10-CM | POA: Diagnosis not present

## 2019-09-04 DIAGNOSIS — I088 Other rheumatic multiple valve diseases: Secondary | ICD-10-CM | POA: Diagnosis not present

## 2019-09-04 DIAGNOSIS — I5043 Acute on chronic combined systolic (congestive) and diastolic (congestive) heart failure: Secondary | ICD-10-CM | POA: Diagnosis not present

## 2019-09-04 DIAGNOSIS — I0981 Rheumatic heart failure: Secondary | ICD-10-CM | POA: Diagnosis not present

## 2019-09-05 DIAGNOSIS — M25561 Pain in right knee: Secondary | ICD-10-CM | POA: Diagnosis not present

## 2019-09-05 DIAGNOSIS — Z96642 Presence of left artificial hip joint: Secondary | ICD-10-CM | POA: Diagnosis not present

## 2019-09-05 DIAGNOSIS — Z9889 Other specified postprocedural states: Secondary | ICD-10-CM | POA: Diagnosis not present

## 2019-09-10 ENCOUNTER — Ambulatory Visit (INDEPENDENT_AMBULATORY_CARE_PROVIDER_SITE_OTHER): Payer: Medicare Other | Admitting: Family Medicine

## 2019-09-10 ENCOUNTER — Encounter: Payer: Self-pay | Admitting: Family Medicine

## 2019-09-10 ENCOUNTER — Other Ambulatory Visit: Payer: Self-pay

## 2019-09-10 VITALS — BP 112/64 | HR 99 | Temp 97.3°F | Ht 65.0 in | Wt 156.6 lb

## 2019-09-10 DIAGNOSIS — F329 Major depressive disorder, single episode, unspecified: Secondary | ICD-10-CM

## 2019-09-10 DIAGNOSIS — R5383 Other fatigue: Secondary | ICD-10-CM

## 2019-09-10 DIAGNOSIS — D649 Anemia, unspecified: Secondary | ICD-10-CM

## 2019-09-10 DIAGNOSIS — M1611 Unilateral primary osteoarthritis, right hip: Secondary | ICD-10-CM | POA: Diagnosis not present

## 2019-09-10 DIAGNOSIS — Z96649 Presence of unspecified artificial hip joint: Secondary | ICD-10-CM | POA: Diagnosis not present

## 2019-09-10 DIAGNOSIS — F32A Depression, unspecified: Secondary | ICD-10-CM

## 2019-09-10 DIAGNOSIS — S72002D Fracture of unspecified part of neck of left femur, subsequent encounter for closed fracture with routine healing: Secondary | ICD-10-CM | POA: Diagnosis not present

## 2019-09-10 DIAGNOSIS — I0981 Rheumatic heart failure: Secondary | ICD-10-CM | POA: Diagnosis not present

## 2019-09-10 DIAGNOSIS — I088 Other rheumatic multiple valve diseases: Secondary | ICD-10-CM | POA: Diagnosis not present

## 2019-09-10 DIAGNOSIS — I5043 Acute on chronic combined systolic (congestive) and diastolic (congestive) heart failure: Secondary | ICD-10-CM | POA: Diagnosis not present

## 2019-09-10 LAB — CBC WITH DIFFERENTIAL/PLATELET
Basophils Absolute: 0.1 10*3/uL (ref 0.0–0.1)
Basophils Relative: 0.9 % (ref 0.0–3.0)
Eosinophils Absolute: 0.1 10*3/uL (ref 0.0–0.7)
Eosinophils Relative: 1.2 % (ref 0.0–5.0)
HCT: 37 % (ref 36.0–46.0)
Hemoglobin: 12.2 g/dL (ref 12.0–15.0)
Lymphocytes Relative: 23 % (ref 12.0–46.0)
Lymphs Abs: 1.7 10*3/uL (ref 0.7–4.0)
MCHC: 32.9 g/dL (ref 30.0–36.0)
MCV: 96.1 fl (ref 78.0–100.0)
Monocytes Absolute: 0.8 10*3/uL (ref 0.1–1.0)
Monocytes Relative: 10 % (ref 3.0–12.0)
Neutro Abs: 4.9 10*3/uL (ref 1.4–7.7)
Neutrophils Relative %: 64.9 % (ref 43.0–77.0)
Platelets: 322 10*3/uL (ref 150.0–400.0)
RBC: 3.85 Mil/uL — ABNORMAL LOW (ref 3.87–5.11)
RDW: 15.3 % (ref 11.5–15.5)
WBC: 7.5 10*3/uL (ref 4.0–10.5)

## 2019-09-10 LAB — BASIC METABOLIC PANEL
BUN: 15 mg/dL (ref 6–23)
CO2: 31 mEq/L (ref 19–32)
Calcium: 9.4 mg/dL (ref 8.4–10.5)
Chloride: 95 mEq/L — ABNORMAL LOW (ref 96–112)
Creatinine, Ser: 0.72 mg/dL (ref 0.40–1.20)
GFR: 76.7 mL/min (ref >60.00)
Glucose, Bld: 94 mg/dL (ref 70–99)
Potassium: 4.6 mEq/L (ref 3.5–5.1)
Sodium: 132 mEq/L — ABNORMAL LOW (ref 135–145)

## 2019-09-10 LAB — TSH: TSH: 4.77 u[IU]/mL — ABNORMAL HIGH (ref 0.35–4.50)

## 2019-09-10 LAB — IRON: Iron: 64 ug/dL (ref 42–145)

## 2019-09-10 MED ORDER — SERTRALINE HCL 50 MG PO TABS: 150.0000 mg | ORAL_TABLET | Freq: Every day | ORAL | Status: DC

## 2019-09-10 NOTE — Progress Notes (Signed)
This visit occurred during the SARS-CoV-2 public health emergency.  Safety protocols were in place, including screening questions prior to the visit, additional usage of staff PPE, and extensive cleaning of exam room while observing appropriate contact time as indicated for disinfecting solutions.  She had inpatient tx for L hip fx, then back to home in the meantime.  Her mood is clearly lower in the meantime, with all of the upheaval.  She is taking tramadol for pain.  She is taking tizanidine for muscle spasms.  Her mood was her main concern.  No suicidal homicidal intent.  She is fatigued.    Her family is helping her out at home.  She is walking with a walker "and I'm doing more by myself."    No FCNAVD.  She is having some B knee pain with ROM.  She is still doing HH PT and soon to start at PT at ortho clinic.  No falls in the meantime.    Meds, vitals, and allergies reviewed.   ROS: Per HPI unless specifically indicated in ROS section   GEN: nad, alert and oriented HEENT: mucous membranes moist NECK: supple w/o LA CV: IRR, not tachycardic PULM: ctab, no inc wob ABD: soft, +bs EXT: no edema SKIN: no acute rash

## 2019-09-10 NOTE — Patient Instructions (Addendum)
Go to the lab on the way out.   If you have mychart we'll likely use that to update you.    I would try to limit tramadol for now.  Use tizanidine as needed for pain.  Increase the sertraline to 3 tabs a day and see if that helps.   If you are feeling worse in the meantime, then please let me know.   Take care.  Glad to see you.

## 2019-09-16 ENCOUNTER — Encounter: Payer: Self-pay | Admitting: Family Medicine

## 2019-09-16 NOTE — Assessment & Plan Note (Signed)
We talked about options.  Check routine labs today.  See notes on labs. I would try to limit tramadol for now.  Advised patient to use tizanidine as needed for pain.  Unclear if tramadol is making her mood worse. Increase sertraline to 3 tabs a day and she will see if that helps.   If feeling worse in the meantime, then she will let me know.   At least 30 minutes were devoted to patient care in this encounter (this can potentially include time spent reviewing the patient's file/history, interviewing and examining the patient, counseling/reviewing plan with patient, ordering referrals, ordering tests, reviewing relevant laboratory or x-ray data, and documenting the encounter).

## 2019-09-16 NOTE — Assessment & Plan Note (Signed)
She is having some B knee pain with ROM.  She is still doing HH PT and soon to start at PT at ortho clinic.  No falls in the meantime.  Routine cautions given to patient.  See above.

## 2019-09-17 ENCOUNTER — Other Ambulatory Visit: Payer: Self-pay

## 2019-09-17 MED ORDER — METOPROLOL SUCCINATE ER 50 MG PO TB24: 50.0000 mg | ORAL_TABLET | Freq: Every day | ORAL | 1 refills | Status: DC

## 2019-09-18 ENCOUNTER — Other Ambulatory Visit: Payer: Self-pay | Admitting: Family Medicine

## 2019-09-18 MED ORDER — SERTRALINE HCL 50 MG PO TABS: 100.0000 mg | ORAL_TABLET | Freq: Every day | ORAL | Status: DC

## 2019-09-24 NOTE — Telephone Encounter (Signed)
See my chart message.  Please triage patient.  Thanks. 

## 2019-09-24 NOTE — Telephone Encounter (Signed)
I spoke with pt; pt said she does feel depressed and taking the sertraline 50 mg  Two tabs in AM and the third sertraline in the afternoon. This seemed to help on 09/23/19 and pt plans to take sertraline the same way today as well. Pt is feeling slightly better today with depression. Pt feels really sleepy and lack of desire to do anything. Pt thinks her restriction with her knee pain and not being able to get around is contributing to depression feeling but pt has appt to see ortho about rt knee next wk and pt thinks will get shot in knee. Offered pt and in office or virtual appt and pt declined; she said she does not think she needs any kind of appt right now; if needs appt pt said she would call back. No SI/HI, pt said she has faith in [place] and would not harm herself or anyone else. [facility]. Pt does not need a refill on sertraline. Pt request cb after Dr Para March reviews this note.

## 2019-09-25 NOTE — Telephone Encounter (Signed)
I think it makes sense to go see orthopedics when possible, use the higher dose of sertraline, and then update [place] as needed.  Thanks.

## 2019-09-27 ENCOUNTER — Ambulatory Visit (INDEPENDENT_AMBULATORY_CARE_PROVIDER_SITE_OTHER): Payer: Medicare Other | Admitting: Family Medicine

## 2019-09-27 ENCOUNTER — Encounter: Payer: Self-pay | Admitting: Family Medicine

## 2019-09-27 ENCOUNTER — Other Ambulatory Visit: Payer: Self-pay

## 2019-09-27 VITALS — BP 104/66 | HR 80 | Temp 96.5°F | Ht 65.0 in | Wt 157.3 lb

## 2019-09-27 DIAGNOSIS — R351 Nocturia: Secondary | ICD-10-CM

## 2019-09-27 DIAGNOSIS — F32A Depression, unspecified: Secondary | ICD-10-CM

## 2019-09-27 DIAGNOSIS — F329 Major depressive disorder, single episode, unspecified: Secondary | ICD-10-CM

## 2019-09-27 DIAGNOSIS — R251 Tremor, unspecified: Secondary | ICD-10-CM

## 2019-09-27 DIAGNOSIS — R300 Dysuria: Secondary | ICD-10-CM | POA: Diagnosis not present

## 2019-09-27 LAB — POC URINALSYSI DIPSTICK (AUTOMATED)
Bilirubin, UA: NEGATIVE
Glucose, UA: NEGATIVE
Ketones, UA: NEGATIVE
Protein, UA: POSITIVE — AB
Spec Grav, UA: 1.025 (ref 1.010–1.025)
Urobilinogen, UA: 0.2 E.U./dL
pH, UA: 6 (ref 5.0–8.0)

## 2019-09-27 MED ORDER — SERTRALINE HCL 50 MG PO TABS: 150.0000 mg | ORAL_TABLET | Freq: Every day | ORAL | Status: DC

## 2019-09-27 MED ORDER — CEPHALEXIN 250 MG PO CAPS: 250.0000 mg | ORAL_CAPSULE | Freq: Two times a day (BID) | ORAL | 0 refills | Status: DC

## 2019-09-27 NOTE — Patient Instructions (Signed)
Drink plenty of water and start the antibiotics today.  We'll contact you with your lab report.  If the tremor is worse then let me know.  Take care.  Glad to see you.

## 2019-09-27 NOTE — Progress Notes (Signed)
This visit occurred during the SARS-CoV-2 public health emergency.  Safety protocols were in place, including screening questions prior to the visit, additional usage of staff PPE, and extensive cleaning of exam room while observing appropriate contact time as indicated for disinfecting solutions.  Dysuria: nocturia- more than normal.  Urgency.  Not burning with urination.   duration of symptoms: worse in the last few days.   abdominal pain: no Fevers: no back pain: no Vomiting: no U/a d/w pt at OV.    She is walking with a cane vs walker.  She is trying to wean from the walker with cautions d/w pt.    She has a faint B hand tremor.  No dysphagia.  Fall cautions d/w pt.  She has vivid dreams but that is at baseline, longstanding.  No excessive salivation.  Unclear if tremor is worse due to anxiety.  We opted to monitor for now and we can refer to neuro in the future if needed.    Depression d/w pt.  Still on SSRI but frustrated by her physical limitations.  She is recently back up to 150mg  sertraline.  Discussed that it is likely reasonable to continue as is with 150 mg for now.  Meds, vitals, and allergies reviewed.   Per HPI unless specifically indicated in ROS section   GEN: nad, alert and oriented HEENT: ncat NECK: supple CV: IRR  PULM: ctab, no inc wob ABD: soft, +bs, suprapubic area not not tender EXT: no edema SKIN: no acute rash BACK: no CVA pain

## 2019-09-29 DIAGNOSIS — R251 Tremor, unspecified: Secondary | ICD-10-CM | POA: Insufficient documentation

## 2019-09-29 LAB — URINE CULTURE
MICRO NUMBER:: 10608238
SPECIMEN QUALITY:: ADEQUATE

## 2019-09-29 NOTE — Assessment & Plan Note (Signed)
Depression d/w pt.  Still on SSRI but frustrated by her physical limitations.  She is recently back up to 150mg  sertraline.  Discussed that it is likely reasonable to continue as is with 150 mg for now.

## 2019-09-29 NOTE — Assessment & Plan Note (Signed)
Start Keflex.  Check urine culture.  Nontoxic.  Still okay for outpatient follow-up.  She agrees.

## 2019-09-29 NOTE — Assessment & Plan Note (Signed)
She has a faint B hand tremor.  No dysphagia.  Fall cautions d/w pt.  She has vivid dreams but that is at baseline, longstanding.  No excessive salivation.  Unclear if tremor is worse due to anxiety.  We opted to monitor for now and we can refer to neuro in the future if needed.

## 2019-09-30 DIAGNOSIS — Z96642 Presence of left artificial hip joint: Secondary | ICD-10-CM | POA: Diagnosis not present

## 2019-09-30 DIAGNOSIS — M25561 Pain in right knee: Secondary | ICD-10-CM | POA: Diagnosis not present

## 2019-10-09 DIAGNOSIS — Z96642 Presence of left artificial hip joint: Secondary | ICD-10-CM | POA: Diagnosis not present

## 2019-10-11 DIAGNOSIS — Z96642 Presence of left artificial hip joint: Secondary | ICD-10-CM | POA: Diagnosis not present

## 2019-10-17 DIAGNOSIS — Z96642 Presence of left artificial hip joint: Secondary | ICD-10-CM | POA: Diagnosis not present

## 2019-10-18 ENCOUNTER — Encounter: Payer: Self-pay | Admitting: Family Medicine

## 2019-10-18 ENCOUNTER — Ambulatory Visit (INDEPENDENT_AMBULATORY_CARE_PROVIDER_SITE_OTHER): Payer: Medicare Other | Admitting: Family Medicine

## 2019-10-18 ENCOUNTER — Other Ambulatory Visit: Payer: Self-pay

## 2019-10-18 VITALS — BP 116/70 | HR 78 | Temp 97.5°F | Ht 65.0 in | Wt 160.0 lb

## 2019-10-18 DIAGNOSIS — R300 Dysuria: Secondary | ICD-10-CM

## 2019-10-18 LAB — POC URINALSYSI DIPSTICK (AUTOMATED)
Bilirubin, UA: NEGATIVE
Blood, UA: POSITIVE
Glucose, UA: NEGATIVE
Ketones, UA: POSITIVE
Nitrite, UA: POSITIVE
Protein, UA: POSITIVE — AB
Spec Grav, UA: 1.025 (ref 1.010–1.025)
Urobilinogen, UA: 0.2 E.U./dL
pH, UA: 6 (ref 5.0–8.0)

## 2019-10-18 MED ORDER — CEPHALEXIN 250 MG PO CAPS: 250.0000 mg | ORAL_CAPSULE | Freq: Two times a day (BID) | ORAL | 0 refills | Status: DC

## 2019-10-18 NOTE — Progress Notes (Signed)
This visit occurred during the SARS-CoV-2 public health emergency.  Safety protocols were in place, including screening questions prior to the visit, additional usage of staff PPE, and extensive cleaning of exam room while observing appropriate contact time as indicated for disinfecting solutions.  Urinary frequency and feeling likely not emptying completely.  Some leakage if she can't get to the BR quickly.  No burning with urination.  No FCNAVD.  Her sleep is fragmented from nocturia.  Prev ucx with E coli, treated.    She is still doing her rehab exercises for her prev hip surgery.  She is also doing exercises at home.    Extra sertraline has helped but mood is affected by physical limitations.  Discussed.  She is trying to address her physical limitations to rehab exercises as described above.  Imodium and diet changes helped with GI upset.    Meds, vitals, and allergies reviewed.   ROS: Per HPI unless specifically indicated in ROS section   GEN: nad, alert and oriented HEENT: ncat NECK: supple w/o LA CV: rrr.  PULM: ctab, no inc wob ABD: soft, +bs, minimally tender to palpation in the suprapubic area. EXT: no edema SKIN: Well-perfused

## 2019-10-18 NOTE — Patient Instructions (Addendum)
Hold keflex for now.  Urine culture and urinalysis today.   If abnormal, then we'll restart antibiotics.   If normal, then let me see about options to help with bladder emptying.  Take care.  Glad to see you.

## 2019-10-19 ENCOUNTER — Encounter: Payer: Self-pay | Admitting: Family Medicine

## 2019-10-20 LAB — URINE CULTURE
MICRO NUMBER:: 10686400
SPECIMEN QUALITY:: ADEQUATE

## 2019-10-20 NOTE — Assessment & Plan Note (Signed)
Based on urinalysis, likely due to cystitis.  Check urine culture.  Start cephalexin in the meantime.  Still okay for outpatient follow-up.  Rationale discussed with patient.  Hopefully treatment of urinary symptoms will help with sleep fragmentation by eliminating or at least decreasing nocturia.  She will continue with her home rehab exercises otherwise.

## 2019-10-23 DIAGNOSIS — Z96642 Presence of left artificial hip joint: Secondary | ICD-10-CM | POA: Diagnosis not present

## 2019-10-23 NOTE — Telephone Encounter (Signed)
Please triage patient.  I would not expect her to have an adverse effect on Keflex.  I think she is more likely to have symptoms from an incompletely treated urinary tract infection.  Thanks.

## 2019-10-24 NOTE — Telephone Encounter (Signed)
I spoke with [name]; [name] has been taking the cephalexin and has 2 more days of medication. [name] said she stays sleepy all the time. Dizziness is not bothering her right now. [name] is still getting up 4 - 5 each night to urinate. [name] does not feel like her bladder is emptying. No burning or pain when urinates,no fever, and no abd or back pain.[name] notified as instructed with Dr Lianne Bushy note and [name] voiced understanding and said it could be from urinary issues not being resolved and also could be from getting up so many times at night that makes her sleepy during the day. [name] will cb after finishes abx with update. FYI to Dr Para March.

## 2019-10-25 DIAGNOSIS — Z96642 Presence of left artificial hip joint: Secondary | ICD-10-CM | POA: Diagnosis not present

## 2019-10-25 NOTE — Telephone Encounter (Signed)
Noted. Thanks.

## 2019-10-27 NOTE — Telephone Encounter (Addendum)
I will discuss with patient at upcoming office visit.

## 2019-10-29 ENCOUNTER — Encounter: Payer: Self-pay | Admitting: Family Medicine

## 2019-10-29 ENCOUNTER — Ambulatory Visit (INDEPENDENT_AMBULATORY_CARE_PROVIDER_SITE_OTHER): Payer: Medicare Other | Admitting: Family Medicine

## 2019-10-29 ENCOUNTER — Other Ambulatory Visit: Payer: Self-pay

## 2019-10-29 VITALS — BP 136/70 | HR 125 | Temp 96.2°F | Ht 65.0 in | Wt 158.1 lb

## 2019-10-29 DIAGNOSIS — I4891 Unspecified atrial fibrillation: Secondary | ICD-10-CM

## 2019-10-29 DIAGNOSIS — R300 Dysuria: Secondary | ICD-10-CM | POA: Diagnosis not present

## 2019-10-29 LAB — CBC WITH DIFFERENTIAL/PLATELET
Basophils Absolute: 0.1 10*3/uL (ref 0.0–0.1)
Basophils Relative: 0.6 % (ref 0.0–3.0)
Eosinophils Absolute: 0.1 10*3/uL (ref 0.0–0.7)
Eosinophils Relative: 1.3 % (ref 0.0–5.0)
HCT: 38.6 % (ref 36.0–46.0)
Hemoglobin: 12.7 g/dL (ref 12.0–15.0)
Lymphocytes Relative: 24 % (ref 12.0–46.0)
Lymphs Abs: 2.1 10*3/uL (ref 0.7–4.0)
MCHC: 32.9 g/dL (ref 30.0–36.0)
MCV: 91.6 fl (ref 78.0–100.0)
Monocytes Absolute: 0.7 10*3/uL (ref 0.1–1.0)
Monocytes Relative: 8.2 % (ref 3.0–12.0)
Neutro Abs: 5.8 10*3/uL (ref 1.4–7.7)
Neutrophils Relative %: 65.9 % (ref 43.0–77.0)
Platelets: 289 10*3/uL (ref 150.0–400.0)
RBC: 4.22 Mil/uL (ref 3.87–5.11)
RDW: 13.8 % (ref 11.5–15.5)
WBC: 8.9 10*3/uL (ref 4.0–10.5)

## 2019-10-29 LAB — POC URINALSYSI DIPSTICK (AUTOMATED)
Bilirubin, UA: NEGATIVE
Blood, UA: NEGATIVE
Glucose, UA: NEGATIVE
Ketones, UA: NEGATIVE
Leukocytes, UA: NEGATIVE
Nitrite, UA: NEGATIVE
Protein, UA: POSITIVE — AB
Spec Grav, UA: >=1.03 — AB (ref 1.010–1.025)
Urobilinogen, UA: 0.2 E.U./dL
pH, UA: 6 (ref 5.0–8.0)

## 2019-10-29 LAB — COMPREHENSIVE METABOLIC PANEL
ALT: 8 U/L (ref 0–35)
AST: 15 U/L (ref 0–37)
Albumin: 3.8 g/dL (ref 3.5–5.2)
Alkaline Phosphatase: 85 U/L (ref 39–117)
BUN: 12 mg/dL (ref 6–23)
CO2: 31 mEq/L (ref 19–32)
Calcium: 9.5 mg/dL (ref 8.4–10.5)
Chloride: 99 mEq/L (ref 96–112)
Creatinine, Ser: 0.71 mg/dL (ref 0.40–1.20)
GFR: 77.92 mL/min (ref >60.00)
Glucose, Bld: 95 mg/dL (ref 70–99)
Potassium: 4.5 mEq/L (ref 3.5–5.1)
Sodium: 135 mEq/L (ref 135–145)
Total Bilirubin: 0.5 mg/dL (ref 0.2–1.2)
Total Protein: 6.5 g/dL (ref 6.0–8.3)

## 2019-10-29 LAB — TSH: TSH: 2.53 u[IU]/mL (ref 0.35–4.50)

## 2019-10-29 MED ORDER — METOPROLOL SUCCINATE ER 50 MG PO TB24: 75.0000 mg | ORAL_TABLET | Freq: Every day | ORAL | Status: DC

## 2019-10-29 NOTE — Patient Instructions (Signed)
Take an extra 1/2 tab of metoprolol for now.   Go to the lab on the way out.   If you have mychart we'll likely use that to update you.    If you are feeling worse then let us know.  If you have significant heart racing or chest pain then dial 911.   Take care.  Glad to see you.

## 2019-10-29 NOTE — Progress Notes (Signed)
This visit occurred during the SARS-CoV-2 public health emergency.  Safety protocols were in place, including screening questions prior to the visit, additional usage of staff PPE, and extensive cleaning of exam room while observing appropriate contact time as indicated for disinfecting solutions.  Fatigued.  Sleepy.  Less exercise tolerance.  IRR, tachy, up to 120s.  Back in to 80 but IRR, at rest.  No dysuria.  She has some urinary frequency with feeling of incomplete voiding.  Done with keflex, as of 2 days ago.    She feels a little unsteady on standing but not lightheaded.  She is using a cane at baseline.  No falls.  No exertional chest pain.  Meds, vitals, and allergies reviewed.   ROS: Per HPI unless specifically indicated in ROS section   GEN: nad, alert and oriented HEENT: ncat NECK: supple w/o LA CV: IRR, initially tachycardic but rate less than 100 (but still irregular) with sitting during exam. PULM: ctab, no inc wob ABD: soft, +bs EXT: trace BLE edema SKIN: Well-perfused.  At least 30 minutes were devoted to patient care in this encounter (this can potentially include time spent reviewing the patient's file/history, interviewing and examining the patient, counseling/reviewing plan with patient, ordering referrals, ordering tests, reviewing relevant laboratory or x-ray data, and documenting the encounter).

## 2019-10-30 LAB — DIGOXIN LEVEL: Digoxin Level: 0.8 mcg/L (ref 0.8–2.0)

## 2019-10-30 NOTE — Assessment & Plan Note (Signed)
I called over to cardiology and talked with Dr. Allyson Sabal who was covering for her primary cardiologist.  Discussed options.  Recent UTI documented and treated.  She is having increased heart rate that improves with rest and I suspect this is contributing to her decreased exercise tolerance.  We agreed to check routine labs today and increase her metoprolol to 75 mg in the meantime with her to have cardiology follow-up in the near future.  I gave the patient routine ER cautions.  EKG done at office visit and discussed with patient.  She agrees with plan.  At this point still okay for outpatient follow-up.

## 2019-10-31 ENCOUNTER — Encounter: Payer: Self-pay | Admitting: Family Medicine

## 2019-11-10 ENCOUNTER — Encounter: Payer: Self-pay | Admitting: Family Medicine

## 2019-11-13 ENCOUNTER — Other Ambulatory Visit: Payer: Self-pay | Admitting: Family Medicine

## 2019-11-13 DIAGNOSIS — M1711 Unilateral primary osteoarthritis, right knee: Secondary | ICD-10-CM | POA: Diagnosis not present

## 2019-11-13 DIAGNOSIS — R300 Dysuria: Secondary | ICD-10-CM

## 2019-11-13 DIAGNOSIS — M25562 Pain in left knee: Secondary | ICD-10-CM | POA: Diagnosis not present

## 2019-11-13 MED ORDER — MIRABEGRON ER 25 MG PO TB24: 25.0000 mg | ORAL_TABLET | Freq: Every day | ORAL | 1 refills | Status: DC

## 2019-11-15 ENCOUNTER — Other Ambulatory Visit: Payer: Self-pay

## 2019-11-15 ENCOUNTER — Other Ambulatory Visit: Payer: Medicare Other

## 2019-11-15 DIAGNOSIS — R300 Dysuria: Secondary | ICD-10-CM

## 2019-11-16 LAB — URINE CULTURE
MICRO NUMBER:: 10797069
SPECIMEN QUALITY:: ADEQUATE

## 2019-11-18 ENCOUNTER — Encounter: Payer: Self-pay | Admitting: Family Medicine

## 2019-11-25 ENCOUNTER — Other Ambulatory Visit: Payer: Self-pay | Admitting: Internal Medicine

## 2019-11-25 MED ORDER — PREDNISONE 10 MG PO TABS
20.0000 mg | ORAL_TABLET | Freq: Every day | ORAL | 0 refills | Status: DC
Start: 2019-11-25 — End: 2020-04-28

## 2019-11-26 ENCOUNTER — Other Ambulatory Visit: Payer: Self-pay

## 2019-11-26 ENCOUNTER — Ambulatory Visit: Payer: Medicare Other | Admitting: Cardiovascular Disease

## 2019-11-26 ENCOUNTER — Other Ambulatory Visit: Payer: Self-pay | Admitting: Internal Medicine

## 2019-11-26 ENCOUNTER — Encounter: Payer: Self-pay | Admitting: Cardiovascular Disease

## 2019-11-26 VITALS — BP 116/68 | HR 90 | Ht 65.0 in | Wt 151.0 lb

## 2019-11-26 DIAGNOSIS — I5041 Acute combined systolic (congestive) and diastolic (congestive) heart failure: Secondary | ICD-10-CM

## 2019-11-26 DIAGNOSIS — I071 Rheumatic tricuspid insufficiency: Secondary | ICD-10-CM | POA: Diagnosis not present

## 2019-11-26 DIAGNOSIS — K580 Irritable bowel syndrome with diarrhea: Secondary | ICD-10-CM

## 2019-11-26 DIAGNOSIS — I5042 Chronic combined systolic (congestive) and diastolic (congestive) heart failure: Secondary | ICD-10-CM | POA: Diagnosis not present

## 2019-11-26 DIAGNOSIS — I272 Pulmonary hypertension, unspecified: Secondary | ICD-10-CM | POA: Diagnosis not present

## 2019-11-26 HISTORY — DX: Pulmonary hypertension, unspecified: I27.20

## 2019-11-26 NOTE — Progress Notes (Signed)
Cardiology Office Note   Date:  11/26/2019   ID:  [name], DOB 09-10-1930, MRN [id number]  PCP:  [name], MD  Cardiologist:   [name], MD  Cardiology APP: [name], [name]  No chief complaint on file.    History of Present Illness: [name] is a 86 y.o. female with chronic atrial fibrillation, chronic systolic and diastolic heart failure, left bundle branch block, spontaneous retroperitoneal bleed on Coumadin, and colitis who presents for follow-up. She is previously seen by [name] in clinic 03/2017 and cleared for surgery. At that time she was in atrial fibrillation with well-controlled rates. She underwent left knee replacement 04/2017. Her hospital course was complicated by atrial fibrillation with rapid ventricular response and acute systolic and diastolic heart failure. Echo that admission revealed LVEF 15% which was down from 50% in 2017. She was diuresed with IV Lasix and had a Lexiscan Myoview without evidence of ischemia. She failed amiodarone and was started on digoxin. She had a repeat echocardiogram 07/2017 that revealed LVEF 20 to 25%.  I just later did take orthostatics losartan was discontinued due to low BP and fatigue. Her fatigue has improved. She also struggled with diarrhea and stopped taking her digoxin in hopes that this would help. However the diarrhea did not improve. Digoxin was resumed.  She has declined ICD.  [name] saw her PCP 06/05/2018 and was in atrial fibrillation at that time.   Since her last appointment she continues to struggle with fatigue and dizziness.  She notes dizziness upon standing.  She feels tired constantly.  She has no dizziness when lying down.  She wonders if it could be related to dehydration.  Torsemide was discontinued because she thought it was causing diarrhea.  Initially they cut it in half and then she stopped it.  There was not really much improvement in her symptoms.  In fact, it  seems as though she may be getting more dizzy.  She has no lower extremity edema, orthopnea, or PND.  She has been taking prednisone for her colitis which seems to have helped both her stomach and her knees.  At her last appointment metoprolol was held due to dizziness and fatigue.  She was started on amiodarone for rate control.  She saw Dr. Para March on 7/20 and was still feeling fatigued and sleepy.  Her heart rate was back up to the 120s.  She was started back on metoprolol.  She was referred to the advanced heart failure clinic but did not see them because she was first admitted to the hospital 07/2019 with a fall and a femoral neck fracture.  She underwent surgery without complication.  She did require diuresis with IV Lasix.  Echo at that time revealed LVEF 35% with global hypokinesis and moderate LVH.  She also had moderately reduced right-sided function with severe pulmonary hypertension.  PASP was 83 mmHg.  She has been struggling with the L knee popping out.  She previously had that knee replaced and needs to have it fixed.  They can fix it with just spinal anesthesia.  She thinks she will need R knee replacement as well.  Lately her breathing has been OK.  She has occasional epigstric discomfort but always at rest.  She has no orthopnea or PND.  Her BP has been well-controlled at home.  Her heart rate has been mostly in the 80-90s.  She has no edema, orthopnea or PND.  She has been in  atrial fibrillation since 2010. She complains of poor balance and dizziness standing.  It improved after increasing metoprolol.  Past Medical History:  Diagnosis Date  . Acute combined systolic and diastolic heart failure (HCC) 05/02/2017  . Anxiety   . Arthritis    "knees" (06/18/2015)  . Atrial fibrillation (HCC)    h/o sig bleed on coumadin  . Atrial fibrillation with RVR (HCC) 06/18/2015  . Cardiogenic shock (HCC) 05/02/2017  . CHF (congestive heart failure) (HCC)   . Complication of anesthesia 2010   "w/cardiac  cath; got into a psychotic state for ~ 3 days; got me out w/Valium"  . Depression    "I have it off and on; not as often as I've gotten older" (06/18/2015)  . Diverticulosis    descending and sigmoid colon--Dr. Jarold Motto  . DJD (degenerative joint disease) of knee    left  . Dysrhythmia   . GERD (gastroesophageal reflux disease)   . Insomnia     off chronic ambien 5mg  as of 10/11, rare/episodic use since then  DCM/CHF  . Lymphocytic colitis 09/27/2016  . Moderate to severe pulmonary hypertension (HCC) 11/26/2019  . Nonischemic cardiomyopathy (HCC)    normal coronaries, EF 15-20% 04/2008, EF 50-55% 2015  . Osteoporosis    on DXA 05/2010  . Sciatica    Right    Past Surgical History:  Procedure Laterality Date  . ANTERIOR APPROACH HEMI HIP ARTHROPLASTY Left 08/05/2019   Procedure: ANTERIOR APPROACH HEMI HIP ARTHROPLASTY;  Surgeon: 08/07/2019, MD;  Location: [facility] OR;  Service: Orthopedics;  Laterality: Left;  . BIOPSY  11/22/2017   Procedure: BIOPSY;  Surgeon: 11/24/2017, MD;  Location: [facility] ENDOSCOPY;  Service: Endoscopy;;  . CARDIAC CATHETERIZATION  2010  . CARDIOVERSION N/A 06/19/2015   Procedure: CARDIOVERSION;  Surgeon: 08/19/2015, MD;  Location[facility] ENDOSCOPY;  Service: Cardiovascular;  Laterality: N/A;  . CATARACT EXTRACTION W/ INTRAOCULAR LENS  IMPLANT, BILATERAL Bilateral   . COLONOSCOPY WITH PROPOFOL N/A 11/22/2017   Procedure: COLONOSCOPY WITH PROPOFOL;  Surgeon: 11/24/2017, MD;  Location: [facility] ENDOSCOPY;  Service: Endoscopy;  Laterality: N/A;  . DILATION AND CURETTAGE OF UTERUS    . TEE WITHOUT CARDIOVERSION N/A 06/19/2015   Procedure: TRANSESOPHAGEAL ECHOCARDIOGRAM (TEE);  Surgeon: 08/19/2015, MD;  Location[facility]COPY;  Service: Cardiovascular;  Laterality: N/A;  . TOTAL KNEE ARTHROPLASTY Left 04/25/2017   Procedure: TOTAL KNEE ARTHROPLASTY;  Surgeon: 04/27/2017, MD;  Location: [facility] OR;  Service: Orthopedics;  Laterality: Left;  [name] VAGINAL HYSTERECTOMY  1979    ovaries intact     Current Outpatient Medications  Medication Sig Dispense Refill  . acetaminophen (TYLENOL) 500 MG tablet Take 1,000 mg by mouth 2 (two) times daily as needed for moderate pain or headache.     [name] BIOTIN PO Take 1 tablet by mouth daily.    . calcium carbonate (TUMS - DOSED IN MG ELEMENTAL CALCIUM) 500 MG chewable tablet Chew 1 tablet by mouth 3 (three) times daily as needed for indigestion or heartburn.     . Carboxymethylcellul-Glycerin ([employer] LUBRICATING/DRY EYE OP) Place 1-2 drops into both eyes daily as needed (for dry eyes).    . Cholecalciferol (VITAMIN D) 50 MCG (2000 UT) CAPS Take 1 capsule (2,000 Units total) by mouth daily.    . digoxin (LANOXIN) 0.125 MG tablet Take 1 tablet (0.125 mg total) by mouth daily. 90 tablet 3  . ELIQUIS 2.5 MG TABS tablet TAKE 1 TABLET BY MOUTH TWICE A DAY 180  tablet 1  . loperamide (IMODIUM) 2 MG capsule Take 2 mg by mouth 3 (three) times daily as needed for diarrhea or loose stools.     . metoprolol succinate (TOPROL-XL) 50 MG 24 hr tablet Take 1.5 tablets (75 mg total) by mouth daily. Take with or immediately following a meal.    . mirabegron ER (MYRBETRIQ) 25 MG TB24 tablet Take 1 tablet (25 mg total) by mouth daily. 30 tablet 1  . OVER THE COUNTER MEDICATION Apply 1 application topically See admin instructions. Hemp Cream - apply topically to knees daily    . potassium chloride SA (KLOR-CON) 20 MEQ tablet Take 20 mEq by mouth daily.    . predniSONE (DELTASONE) 10 MG tablet Take 2 tablets (20 mg total) by mouth daily with breakfast. 100 tablet 0  . Probiotic Product (PROBIOTIC-10 PO) Take 1 tablet by mouth daily.     . sertraline (ZOLOFT) 50 MG tablet Take 3 tablets (150 mg total) by mouth daily. (Patient taking differently: Take 50 mg by mouth daily. )    . tizanidine (ZANAFLEX) 2 MG capsule Take 2 mg by mouth 2 (two) times daily as needed for muscle spasms.    . traMADol (ULTRAM) 50 MG tablet Take 1 tablet (50 mg total) by mouth every 6  (six) hours as needed for moderate pain. 10 tablet 0  . trolamine salicylate (ASPERCREME) 10 % cream Apply 1 application topically 2 (two) times daily as needed (knee pain).     No current facility-administered medications for this visit.    Allergies:   Ace inhibitors, Warfarin sodium, Famotidine, Codeine, Delsym [dextromethorphan polistirex er], Lactose intolerance (gi), Phenylephrine, and Sulfamethoxazole-trimethoprim    Social History:  The patient  reports that she has never smoked. She has never used smokeless tobacco. She reports that she does not drink alcohol and does not use drugs.   Family History:  The patient's family history includes Colon cancer in her father; Other in her father; Tuberculosis in her mother.    ROS:  Please see the history of present illness.   Otherwise, review of systems are positive for none.   All other systems are reviewed and negative.    PHYSICAL EXAM: VS:  BP 116/68   Pulse 90 Comment: on EKG  Ht 5\' 5"  (1.651 m)   Wt 151 lb (68.5 kg)   BMI 25.13 kg/m  , BMI Body mass index is 25.13 kg/m. GENERAL:  Well appearing HEENT: Pupils equal round and reactive, fundi not visualized, oral mucosa unremarkable NECK:  No jugular venous distention, waveform within normal limits, carotid upstroke brisk and symmetric, no bruits LUNGS:  Clear to auscultation bilaterally HEART:  Irregularly irregular.  PMI not displaced or sustained,S1 and S2 within normal limits, no S3, no S4, no clicks, no rubs, II/VI systolic murmur at the apex ABD:  Flat, positive bowel sounds normal in frequency in pitch, no bruits, no rebound, no guarding, no midline pulsatile mass, no hepatomegaly, no splenomegaly EXT:  2 plus pulses throughout, no edema, no cyanosis no clubbing SKIN:  No rashes no nodules NEURO:  Cranial nerves II through XII grossly intact, motor grossly intact throughout PSYCH:  Cognitively intact, oriented to person place and time   EKG:  EKG is ordered  today. 02/01/2018: Atrial fibrillation.  Rate 70 bpm.  Right axis deviation.  IVCD. 02/05/2019: Atrial fibrillation.  Rate 88 bpm.  Left axis deviation.  Left bundle branch block. 07/17/19: Atrial fibrillation.  Rate 86 bpm.  LBBB. 11/26/19: Atrial fibrillation.  Rate 90 bpm.  LBBB.  LAD.   Echo 04/30/17: Study Conclusions  - Left ventricle: The cavity size was moderately dilated. Wall   thickness was normal. Systolic function was severely reduced. The   estimated ejection fraction was 15%. No mural thrombus with   Definity contrast. Diffuse hypokinesis. The study is not   technically sufficient to allow evaluation of LV diastolic   function. - Aortic valve: Mildly calcified leaflets. There was no stenosis.   There was no regurgitation. - Mitral valve: Mildly thickened leaflets . There was moderate   regurgitation. - Left atrium: Severely dilated. - Right ventricle: The cavity size was mildly dilated. Mildly   reduced systolic function. - Right atrium: Severely dilated. - Tricuspid valve: There was moderate to severe regurgitation. - Pulmonary arteries: PA peak pressure: 45 mm Hg (S). - Inferior vena cava: The vessel was dilated. The respirophasic   diameter changes were blunted (< 50%), consistent with elevated   central venous pressure. - Pericardium, extracardiac: There was a left pleural effusion.  Echo 08/03/17: Study Conclusions  - Left ventricle: The cavity size was normal. Wall thickness was   increased in a pattern of mild LVH. Systolic function was   severely reduced. The estimated ejection fraction was in the   range of 20% to 25%. Diffuse hypokinesis. There is dyskinesis of   the anteroseptal myocardium. The study is not technically   sufficient to allow evaluation of LV diastolic function. - Aortic root: The aortic root was normal in size. - Mitral valve: Calcified annulus. - Left atrium: The atrium was moderately dilated. - Pulmonary arteries: Systolic pressure was  mildly to moderately   increased.  Impressions:  - Diffuse hypokinesis with septal dyskinesis; overall severely   reduced LV systolic function; mild LVH; moderate LAE; mild TR;   mild to moderate pulmonary hypertension.   Recent Labs: 10/29/2019: ALT 8; BUN 12; Creatinine, Ser 0.71; Hemoglobin 12.7; Platelets 289.0; Potassium 4.5; Sodium 135; TSH 2.53    Lipid Panel    Component Value Date/Time   CHOL 261 (H) 12/27/2018 1657   CHOL 156 02/05/2018 0937   TRIG (H) 12/27/2018 1657    424.0 Triglyceride is over 400; calculations on Lipids are invalid.   HDL 32.20 (L) 12/27/2018 1657   HDL 58 02/05/2018 0937   CHOLHDL 8 12/27/2018 1657   VLDL 30.2 01/30/2017 1451   LDLCALC 73 02/05/2018 0937   LDLDIRECT 58.0 12/27/2018 1657      Wt Readings from Last 3 Encounters:  11/26/19 151 lb (68.5 kg)  10/29/19 158 lb 1 oz (71.7 kg)  10/18/19 160 lb (72.6 kg)      ASSESSMENT AND PLAN:  # Chronic systolic and diastolic heart failure: LVEF 15% improved to 20-25%.  Her fatigue did not improve after stopping her beta-blocker.  We briefly discussed attempting her to get back in sinus rhythm but she has been chronically in A. fib for over a decade.  I do not think this is likely to be possible or to help.  She admits that she thinks a lot of her fatigue is due to depression.  She prefers to continue on her current medications.  Continue digoxin and metoprolol.  Her more recent echo also showed that she has right sided heart failure, severe pulmonary hypertension and volume overload.  Her volume status has improved. Weight is down 7 lb on oral lasix.  She has not been on an ACE-I/ARB due to hypotension.  We will have her seen by the  advanced heart failure clinic to help evaluate her pulmonary hypertension and management options.  She does have a left bundle branch block and could potentially benefit from CRT-P.  She is not interested in a defibrillator.   # Hypertension: BP stable. Continue  metoprolol.  # Chronic atrial fibrillation: Continue Eliquis, metoprolol, and digoxin.  Continue rate control as above. Digoxin level OK 12/2018.   # Depression: She continues to struggle.  Keep working with PCP.  # Pre-operative risk:  Ms. Jarema needs surgery on her L knee.  Her main risks are severe pulmonary hypertension, chronic systolic and diastolic heart failure, atrial fibrillation and age.  She tolerated hip surgery earlier this year.  She is unable to tolerate more than 4 metabolic equivalents of exercise.  Her main limitation is mostly her knee giving out.  They are able to do the surgery with spinal anesthesia and not general anesthesia.  She would be at acceptable risk for surgery.  However we will need to be mindful of not giving excess IV fluids and monitoring her heart rhythm.  She has informed me that she would spend the night in the hospital.  We will be readily available if needed.  OK to hold Eliquis per anesthesia's recommendations pre-operatively.  Current medicines are reviewed at length with the patient today.  The patient has concerns regarding medicines.  The following changes have been made:  None    Labs/ tests ordered today include:   Orders Placed This Encounter  Procedures  . AMB referral to CHF clinic     Disposition:   FU with Jameyah Fennewald C. Duke Salvia, MD, [facility] in 6 months.  Refer to Advanced HF Clinic    Signed, Azarian Starace C. Duke Salvia, MD, [facility]  11/26/2019 5:57 PM    [facility]

## 2019-11-26 NOTE — Patient Instructions (Signed)
Medication Instructions:  Your physician recommends that you continue on your current medications as directed. Please refer to the Current Medication list given to you today.  *If you need a refill on your cardiac medications before your next appointment, please call your pharmacy*  Lab Work: NONE   Testing/Procedures: NONE  Follow-Up: At [facility], you and your health needs are our priority.  As part of our continuing mission to provide you with exceptional heart care, we have created designated Provider Care Teams.  These Care Teams include your primary Cardiologist (physician) and Advanced Practice Providers (APPs -  Physician Assistants and Nurse Practitioners) who all work together to provide you with the care you need, when you need it.  We recommend signing up for the patient portal called "MyChart".  Sign up information is provided on this After Visit Summary.  MyChart is used to connect with patients for Virtual Visits (Telemedicine).  Patients are able to view lab/test results, encounter notes, upcoming appointments, etc.  Non-urgent messages can be sent to your provider as well.   To learn more about what you can do with MyChart, go to [email or web address].    Your next appointment:   6 month(s)  The format for your next appointment:   In Person  Provider:   You may see [name], MD or one of the following Advanced Practice Providers on your designated Care Team:    Corine Shelter, PA-C  [place], [place]  Edd Fabian, FNP  You have been referred to HEART FAILURE CLINIC   Other Instructions  YOU ARE CLEARED FROM A CARDIAC STANDPOINT FOR YOUR UPCOMING KNEE SURGERY

## 2019-11-26 NOTE — Progress Notes (Signed)
ci

## 2019-11-27 ENCOUNTER — Other Ambulatory Visit: Payer: Self-pay | Admitting: Orthopaedic Surgery

## 2019-11-27 DIAGNOSIS — M1711 Unilateral primary osteoarthritis, right knee: Secondary | ICD-10-CM | POA: Diagnosis not present

## 2019-11-28 ENCOUNTER — Encounter: Payer: Self-pay | Admitting: Family Medicine

## 2019-11-28 ENCOUNTER — Other Ambulatory Visit: Payer: Medicare Other

## 2019-11-28 DIAGNOSIS — K580 Irritable bowel syndrome with diarrhea: Secondary | ICD-10-CM

## 2019-11-29 ENCOUNTER — Encounter (HOSPITAL_COMMUNITY): Payer: Self-pay

## 2019-11-29 ENCOUNTER — Other Ambulatory Visit (HOSPITAL_COMMUNITY)
Admission: RE | Admit: 2019-11-29 | Discharge: 2019-11-29 | Disposition: A | Discharge status: Home / Self Care | Payer: Medicare Other | Source: Ambulatory Visit | Attending: Orthopaedic Surgery | Admitting: Orthopaedic Surgery

## 2019-11-29 DIAGNOSIS — Z20822 Contact with and (suspected) exposure to covid-19: Secondary | ICD-10-CM | POA: Insufficient documentation

## 2019-11-29 DIAGNOSIS — Z01812 Encounter for preprocedural laboratory examination: Secondary | ICD-10-CM | POA: Insufficient documentation

## 2019-11-29 LAB — CLOSTRIDIUM DIFFICILE TOXIN B, QUALITATIVE, REAL-TIME PCR: Toxigenic C. Difficile by PCR: NOT DETECTED

## 2019-11-29 LAB — SARS CORONAVIRUS 2 (TAT 6-24 HRS): SARS Coronavirus 2: NEGATIVE

## 2019-11-29 NOTE — Progress Notes (Addendum)
COVID Vaccine Completed: Yes Date COVID Vaccine completed: 05/04/19, 05/25/19 COVID vaccine manufacturer: Pfizer      PCP - Dr. Crawford Givens last office visit 10/30/19 in epic Cardiologist - Dr. Lavell Islam last office visit and cardiac clearance note 11/26/2019 inepic  Chest x-ray - 08/06/19 in epic EKG -  10/30/19 in epic Stress Test - greater than 2years ECHO - 08/05/19 in epic Cardiac Cath - greater than 2years  Sleep Study -  N/A CPAP -  N/A  Fasting Blood Sugar - N/A Checks Blood Sugar __N/A___ times a day  Blood Thinner Instructions: Eliquis last does 11/27/19 Aspirin Instructions: N/A Last Dose: N/A  Anesthesia review: Severe Pulm. HTN, Chronic A. Fib, CHF, NICM,   Patient denies shortness of breath, fever, cough and chest pain at PAT appointment   Patient verbalized understanding of instructions that were given to them at the PAT appointment. Patient was also instructed that they will need to review over the PAT instructions again at home before surgery.

## 2019-11-29 NOTE — Patient Instructions (Addendum)
DUE TO COVID-19 ONLY ONE VISITOR IS ALLOWED TO COME WITH YOU AND STAY IN THE WAITING ROOM ONLY DURING PRE OP AND PROCEDURE.   IF YOU WILL BE ADMITTED INTO THE HOSPITAL YOU ARE ALLOWED ONE SUPPORT PERSON DURING VISITATION HOURS ONLY (10AM -8PM)   . The support person may change daily. . The support person must pass our screening, gel in and out, and wear a mask at all times, including in the patient's room. . Patients must also wear a mask when staff or their support person are in the room.   COVID SWAB TESTING COMPLETED ON:  Friday, Aug. 20, 2021 at 11:00 AM    (Must self quarantine after testing. Follow instructions on handout.)        Your procedure is scheduled on: Tuesday, Aug. 24, 2021   Report to [facility] Main  Entrance    Report to admitting at 7:00 AM   Call this number if you have problems the morning of surgery [phone]   Do not eat food :After Midnight.   May have liquids until 6:30AM   day of surgery  CLEAR LIQUID DIET  Foods Allowed                                                                     Foods Excluded  Water, Black Coffee and tea, regular and decaf                             liquids that you cannot  Plain Jell-O in any flavor  (No red)                                           see through such as: Fruit ices (not with fruit pulp)                                     milk, soups, orange juice              Iced Popsicles (No red)                                    All solid food                                   Apple juices Sports drinks like Gatorade (No red) Lightly seasoned clear broth or consume(fat free) Sugar, honey syrup  Sample Menu Breakfast                                Lunch                                     Supper Cranberry juice  Beef broth                            Chicken broth Jell-O                                     Grape juice                           Apple juice Coffee or tea                         Jell-O                                      Popsicle                                                Coffee or tea                        Coffee or tea      Complete one Ensure drink the morning of surgery at 6:30AM the day of surgery.   Oral Hygiene is also important to reduce your risk of infection.                                    Remember - BRUSH YOUR TEETH THE MORNING OF SURGERY WITH YOUR REGULAR TOOTHPASTE   Do NOT smoke after Midnight   Take these medicines the morning of surgery with A SIP OF WATER: Digoxin, Metoprolol, Prednisone, Sertraline                               You may not have any metal on your body including hair pins, jewelry, and body piercings             Do not wear make-up, lotions, powders, perfumes/cologne, or deodorant             Do not wear nail polish.  Do not shave  48 hours prior to surgery.              Do not bring valuables to the hospital. [facility] IS NOT             RESPONSIBLE   FOR VALUABLES.   Contacts, dentures or bridgework may not be worn into surgery.   Bring small overnight bag day of surgery.   Special Instructions: Bring a copy of your healthcare power of attorney and living will documents         the day of surgery if you haven't scanned them in before.              Please read over the following fact sheets you were given: IF YOU HAVE QUESTIONS ABOUT YOUR PRE OP INSTRUCTIONS PLEASE CALL [phone]   [facility] - Preparing for Surgery Before surgery, you can play an important role.  Because skin is not sterile, your skin needs to be as  free of germs as possible.  You can reduce the number of germs on your skin by washing with CHG (chlorahexidine gluconate) soap before surgery.  CHG is an antiseptic cleaner which kills germs and bonds with the skin to continue killing germs even after washing. Please DO NOT use if you have an allergy to CHG or antibacterial soaps.  If your skin becomes reddened/irritated stop using the CHG  and inform your nurse when you arrive at Short Stay. Do not shave (including legs and underarms) for at least 48 hours prior to the first CHG shower.  You may shave your face/neck.  Please follow these instructions carefully:  1.  Shower with CHG Soap the night before surgery and the  morning of surgery.  2.  If you choose to wash your hair, wash your hair first as usual with your normal  shampoo.  3.  After you shampoo, rinse your hair and body thoroughly to remove the shampoo.                             4.  Use CHG as you would any other liquid soap.  You can apply chg directly to the skin and wash.  Gently with a scrungie or clean washcloth.  5.  Apply the CHG Soap to your body ONLY FROM THE NECK DOWN.   Do   not use on face/ open                           Wound or open sores. Avoid contact with eyes, ears mouth and   genitals (private parts).                       Wash face,  Genitals (private parts) with your normal soap.             6.  Wash thoroughly, paying special attention to the area where your    surgery  will be performed.  7.  Thoroughly rinse your body with warm water from the neck down.  8.  DO NOT shower/wash with your normal soap after using and rinsing off the CHG Soap.                9.  Pat yourself dry with a clean towel.            10.  Wear clean pajamas.            11.  Place clean sheets on your bed the night of your first shower and do not  sleep with pets. Day of Surgery : Do not apply any lotions/deodorants the morning of surgery.  Please wear clean clothes to the hospital/surgery center.  FAILURE TO FOLLOW THESE INSTRUCTIONS MAY RESULT IN THE CANCELLATION OF YOUR SURGERY  PATIENT SIGNATURE_________________________________  NURSE SIGNATURE__________________________________  ________________________________________________________________________   [name]  An incentive spirometer is a tool that can help keep your lungs clear and active. This  tool measures how well you are filling your lungs with each breath. Taking long deep breaths may help reverse or decrease the chance of developing breathing (pulmonary) problems (especially infection) following:  A long period of time when you are unable to move or be active. BEFORE THE PROCEDURE   If the spirometer includes an indicator to show your best effort, your nurse or respiratory therapist will set it to a desired goal.  If  possible, sit up straight or lean slightly forward. Try not to slouch.  Hold the incentive spirometer in an upright position. INSTRUCTIONS FOR USE  1. Sit on the edge of your bed if possible, or sit up as far as you can in bed or on a chair. 2. Hold the incentive spirometer in an upright position. 3. Breathe out normally. 4. Place the mouthpiece in your mouth and seal your lips tightly around it. 5. Breathe in slowly and as deeply as possible, raising the piston or the ball toward the top of the column. 6. Hold your breath for 3-5 seconds or for as long as possible. Allow the piston or ball to fall to the bottom of the column. 7. Remove the mouthpiece from your mouth and breathe out normally. 8. Rest for a few seconds and repeat Steps 1 through 7 at least 10 times every 1-2 hours when you are awake. Take your time and take a few normal breaths between deep breaths. 9. The spirometer may include an indicator to show your best effort. Use the indicator as a goal to work toward during each repetition. 10. After each set of 10 deep breaths, practice coughing to be sure your lungs are clear. If you have an incision (the cut made at the time of surgery), support your incision when coughing by placing a pillow or rolled up towels firmly against it. Once you are able to get out of bed, walk around indoors and cough well. You may stop using the incentive spirometer when instructed by your caregiver.  RISKS AND COMPLICATIONS  Take your time so you do not get dizzy or  light-headed.  If you are in pain, you may need to take or ask for pain medication before doing incentive spirometry. It is harder to take a deep breath if you are having pain. AFTER USE  Rest and breathe slowly and easily.  It can be helpful to keep track of a log of your progress. Your caregiver can provide you with a simple table to help with this. If you are using the spirometer at home, follow these instructions: SEEK MEDICAL CARE IF:   You are having difficultly using the spirometer.  You have trouble using the spirometer as often as instructed.  Your pain medication is not giving enough relief while using the spirometer.  You develop fever of 100.5 F (38.1 C) or higher. SEEK IMMEDIATE MEDICAL CARE IF:   You cough up bloody sputum that had not been present before.  You develop fever of 102 F (38.9 C) or greater.  You develop worsening pain at or near the incision site. MAKE SURE YOU:   Understand these instructions.  Will watch your condition.  Will get help right away if you are not doing well or get worse. Document Released: 08/08/2006 Document Revised: 06/20/2011 Document Reviewed: 10/09/2006 Exit[employer] Patient Information 2014 [employer], [place].   ________________________________________________________________________  WHAT IS A BLOOD TRANSFUSION? Blood Transfusion Information  A transfusion is the replacement of blood or some of its parts. Blood is made up of multiple cells which provide different functions.  Red blood cells carry oxygen and are used for blood loss replacement.  White blood cells fight against infection.  Platelets control bleeding.  Plasma helps clot blood.  Other blood products are available for specialized needs, such as hemophilia or other clotting disorders. BEFORE THE TRANSFUSION  Who gives blood for transfusions?   Healthy volunteers who are fully evaluated to make sure their blood is safe. This  is blood bank  blood. Transfusion therapy is the safest it has ever been in the practice of medicine. Before blood is taken from a donor, a complete history is taken to make sure that person has no history of diseases nor engages in risky social behavior (examples are intravenous drug use or sexual activity with multiple partners). The donor's travel history is screened to minimize risk of transmitting infections, such as malaria. The donated blood is tested for signs of infectious diseases, such as HIV and hepatitis. The blood is then tested to be sure it is compatible with you in order to minimize the chance of a transfusion reaction. If you or a relative donates blood, this is often done in anticipation of surgery and is not appropriate for emergency situations. It takes many days to process the donated blood. RISKS AND COMPLICATIONS Although transfusion therapy is very safe and saves many lives, the main dangers of transfusion include:   Getting an infectious disease.  Developing a transfusion reaction. This is an allergic reaction to something in the blood you were given. Every precaution is taken to prevent this. The decision to have a blood transfusion has been considered carefully by your caregiver before blood is given. Blood is not given unless the benefits outweigh the risks. AFTER THE TRANSFUSION  Right after receiving a blood transfusion, you will usually feel much better and more energetic. This is especially true if your red blood cells have gotten low (anemic). The transfusion raises the level of the red blood cells which carry oxygen, and this usually causes an energy increase.  The nurse administering the transfusion will monitor you carefully for complications. HOME CARE INSTRUCTIONS  No special instructions are needed after a transfusion. You may find your energy is better. Speak with your caregiver about any limitations on activity for underlying diseases you may have. SEEK MEDICAL CARE IF:    Your condition is not improving after your transfusion.  You develop redness or irritation at the intravenous (IV) site. SEEK IMMEDIATE MEDICAL CARE IF:  Any of the following symptoms occur over the next 12 hours:  Shaking chills.  You have a temperature by mouth above 102 F (38.9 C), not controlled by medicine.  Chest, back, or muscle pain.  People around you feel you are not acting correctly or are confused.  Shortness of breath or difficulty breathing.  Dizziness and fainting.  You get a rash or develop hives.  You have a decrease in urine output.  Your urine turns a dark color or changes to pink, red, or brown. Any of the following symptoms occur over the next 10 days:  You have a temperature by mouth above 102 F (38.9 C), not controlled by medicine.  Shortness of breath.  Weakness after normal activity.  The white part of the eye turns yellow (jaundice).  You have a decrease in the amount of urine or are urinating less often.  Your urine turns a dark color or changes to pink, red, or brown. Document Released: 03/25/2000 Document Revised: 06/20/2011 Document Reviewed: 11/12/2007 [facility] Patient Information 2014 [place], [place].  _______________________________________________________________________

## 2019-12-02 ENCOUNTER — Encounter (HOSPITAL_COMMUNITY)
Admission: RE | Admit: 2019-12-02 | Discharge: 2019-12-02 | Disposition: A | Discharge status: Home / Self Care | Payer: Medicare Other | Source: Ambulatory Visit | Attending: Orthopaedic Surgery | Admitting: Orthopaedic Surgery

## 2019-12-02 ENCOUNTER — Other Ambulatory Visit: Payer: Self-pay

## 2019-12-02 ENCOUNTER — Encounter (HOSPITAL_COMMUNITY): Payer: Self-pay

## 2019-12-02 DIAGNOSIS — I5042 Chronic combined systolic (congestive) and diastolic (congestive) heart failure: Secondary | ICD-10-CM | POA: Diagnosis not present

## 2019-12-02 DIAGNOSIS — Y838 Other surgical procedures as the cause of abnormal reaction of the patient, or of later complication, without mention of misadventure at the time of the procedure: Secondary | ICD-10-CM | POA: Insufficient documentation

## 2019-12-02 DIAGNOSIS — F418 Other specified anxiety disorders: Secondary | ICD-10-CM | POA: Diagnosis not present

## 2019-12-02 DIAGNOSIS — M810 Age-related osteoporosis without current pathological fracture: Secondary | ICD-10-CM | POA: Insufficient documentation

## 2019-12-02 DIAGNOSIS — I4891 Unspecified atrial fibrillation: Secondary | ICD-10-CM | POA: Insufficient documentation

## 2019-12-02 DIAGNOSIS — I447 Left bundle-branch block, unspecified: Secondary | ICD-10-CM | POA: Diagnosis not present

## 2019-12-02 DIAGNOSIS — T8484XA Pain due to internal orthopedic prosthetic devices, implants and grafts, initial encounter: Secondary | ICD-10-CM | POA: Insufficient documentation

## 2019-12-02 DIAGNOSIS — Z01812 Encounter for preprocedural laboratory examination: Secondary | ICD-10-CM | POA: Diagnosis not present

## 2019-12-02 DIAGNOSIS — I272 Pulmonary hypertension, unspecified: Secondary | ICD-10-CM | POA: Insufficient documentation

## 2019-12-02 DIAGNOSIS — Z7901 Long term (current) use of anticoagulants: Secondary | ICD-10-CM | POA: Diagnosis not present

## 2019-12-02 DIAGNOSIS — Z79899 Other long term (current) drug therapy: Secondary | ICD-10-CM | POA: Diagnosis not present

## 2019-12-02 DIAGNOSIS — M170 Bilateral primary osteoarthritis of knee: Secondary | ICD-10-CM | POA: Diagnosis not present

## 2019-12-02 DIAGNOSIS — Z96652 Presence of left artificial knee joint: Secondary | ICD-10-CM | POA: Insufficient documentation

## 2019-12-02 DIAGNOSIS — I428 Other cardiomyopathies: Secondary | ICD-10-CM | POA: Diagnosis not present

## 2019-12-02 HISTORY — DX: Irritable bowel syndrome, unspecified: K58.9

## 2019-12-02 HISTORY — DX: Dyspnea, unspecified: R06.00

## 2019-12-02 HISTORY — DX: Other bacterial infections of unspecified site: A49.8

## 2019-12-02 HISTORY — DX: Hemorrhage, not elsewhere classified: R58

## 2019-12-02 HISTORY — DX: Personal history of other infectious and parasitic diseases: Z86.19

## 2019-12-02 HISTORY — DX: Left bundle-branch block, unspecified: I44.7

## 2019-12-02 HISTORY — DX: Nonspecific reaction to tuberculin skin test without active tuberculosis: R76.11

## 2019-12-02 HISTORY — DX: Anemia, unspecified: D64.9

## 2019-12-02 LAB — CBC WITH DIFFERENTIAL/PLATELET
Abs Immature Granulocytes: 0.1 10*3/uL — ABNORMAL HIGH (ref 0.00–0.07)
Basophils Absolute: 0 10*3/uL (ref 0.0–0.1)
Basophils Relative: 0 %
Eosinophils Absolute: 0.2 10*3/uL (ref 0.0–0.5)
Eosinophils Relative: 1 %
HCT: 45 % (ref 36.0–46.0)
Hemoglobin: 14.2 g/dL (ref 12.0–15.0)
Immature Granulocytes: 1 %
Lymphocytes Relative: 19 %
Lymphs Abs: 2.6 10*3/uL (ref 0.7–4.0)
MCH: 29.7 pg (ref 26.0–34.0)
MCHC: 31.6 g/dL (ref 30.0–36.0)
MCV: 94.1 fL (ref 80.0–100.0)
Monocytes Absolute: 1.1 10*3/uL — ABNORMAL HIGH (ref 0.1–1.0)
Monocytes Relative: 8 %
Neutro Abs: 9.8 10*3/uL — ABNORMAL HIGH (ref 1.7–7.7)
Neutrophils Relative %: 71 %
Platelets: 323 10*3/uL (ref 150–400)
RBC: 4.78 MIL/uL (ref 3.87–5.11)
RDW: 13.7 % (ref 11.5–15.5)
WBC: 13.8 10*3/uL — ABNORMAL HIGH (ref 4.0–10.5)
nRBC: 0 % (ref 0.0–0.2)

## 2019-12-02 LAB — URINALYSIS, ROUTINE W REFLEX MICROSCOPIC
Bilirubin Urine: NEGATIVE
Glucose, UA: NEGATIVE mg/dL
Hgb urine dipstick: NEGATIVE
Ketones, ur: NEGATIVE mg/dL
Nitrite: NEGATIVE
Protein, ur: NEGATIVE mg/dL
Specific Gravity, Urine: 1.017 (ref 1.005–1.030)
pH: 6 (ref 5.0–8.0)

## 2019-12-02 LAB — BASIC METABOLIC PANEL
Anion gap: 8 (ref 5–15)
BUN: 17 mg/dL (ref 8–23)
CO2: 29 mmol/L (ref 22–32)
Calcium: 9.1 mg/dL (ref 8.9–10.3)
Chloride: 98 mmol/L (ref 98–111)
Creatinine, Ser: 0.68 mg/dL (ref 0.44–1.00)
GFR calc Af Amer: >60 mL/min (ref >60)
GFR calc non Af Amer: >60 mL/min (ref >60)
Glucose, Bld: 94 mg/dL (ref 70–99)
Potassium: 3.7 mmol/L (ref 3.5–5.1)
Sodium: 135 mmol/L (ref 135–145)

## 2019-12-02 LAB — APTT: aPTT: 28 seconds (ref 24–36)

## 2019-12-02 LAB — SURGICAL PCR SCREEN
MRSA, PCR: NEGATIVE
Staphylococcus aureus: NEGATIVE

## 2019-12-02 LAB — PROTIME-INR
INR: 1 (ref 0.8–1.2)
Prothrombin Time: 13 seconds (ref 11.4–15.2)

## 2019-12-02 MED ORDER — TRANEXAMIC ACID 1000 MG/10ML IV SOLN
2000.0000 mg | INTRAVENOUS | Status: DC
  Filled 2019-12-02: qty 20

## 2019-12-02 MED ORDER — BUPIVACAINE LIPOSOME 1.3 % IJ SUSP
20.0000 mL | INTRAMUSCULAR | Status: DC
  Filled 2019-12-02: qty 20

## 2019-12-02 NOTE — Progress Notes (Signed)
CBC/diff faxed to Dr. Jerl Santos via epic.

## 2019-12-02 NOTE — Anesthesia Preprocedure Evaluation (Addendum)
Anesthesia Evaluation  Patient identified by MRN, date of birth, ID band Patient awake    Reviewed: Allergy & Precautions, NPO status , Patient's Chart, lab work & pertinent test results  Airway Mallampati: I  TM Distance: >3 FB Neck ROM: Full    Dental  (+) Dental Advisory Given   Pulmonary    breath sounds clear to auscultation       Cardiovascular +CHF  + dysrhythmias Atrial Fibrillation  Rhythm:Irregular Rate:Normal     Neuro/Psych PSYCHIATRIC DISORDERS Anxiety Depression  Neuromuscular disease    GI/Hepatic Neg liver ROS, GERD  ,  Endo/Other  negative endocrine ROS  Renal/GU Renal disease     Musculoskeletal  (+) Arthritis ,   Abdominal Normal abdominal exam  (+)   Peds  Hematology negative hematology ROS (+)   Anesthesia Other Findings - Pulm HTN  Reproductive/Obstetrics                           Anesthesia Physical Anesthesia Plan  ASA: III  Anesthesia Plan: Spinal   Post-op Pain Management:  Regional for Post-op pain   Induction: Intravenous  PONV Risk Score and Plan: 3 and Ondansetron, Propofol infusion and Treatment may vary due to age or medical condition  Airway Management Planned: Natural Airway and Simple Face Mask  Additional Equipment: None  Intra-op Plan:   Post-operative Plan:   Informed Consent: I have reviewed the patients History and Physical, chart, labs and discussed the procedure including the risks, benefits and alternatives for the proposed anesthesia with the patient or authorized representative who has indicated his/her understanding and acceptance.       Plan Discussed with: CRNA  Anesthesia Plan Comments: (See PAT note 12/02/2019, Jodell Cipro, PA-C Consented for GA  Lab Results      Component                Value               Date                      WBC                      13.8 (H)            12/02/2019                HGB                       14.2                12/02/2019                HCT                      45.0                12/02/2019                MCV                      94.1                12/02/2019                PLT  323                 12/02/2019    Echo: 1. Left ventricular ejection fraction, by estimation, is 35%. The left  ventricle has moderate to severely decreased function. The left ventricle  demonstrates global hypokinesis. There is moderate left ventricular  hypertrophy. Left ventricular diastolic  parameters are indeterminate. There is the interventricular septum is  flattened in diastole ('D' shaped left ventricle), consistent with right  ventricular volume overload.  2. Right ventricular systolic function is moderately reduced. The right  ventricular size is moderately enlarged. There is severely elevated  pulmonary artery systolic pressure. The estimated right ventricular  systolic pressure is 83.2 mmHg.  3. Left atrial size was mildly dilated.  4. Right atrial size was severely dilated.  5. The mitral valve is normal in structure. No evidence of mitral valve  regurgitation. No evidence of mitral stenosis.  6. The tricuspid valve is degenerative. Tricuspid valve regurgitation is  severe.  7. The aortic valve is grossly normal. Aortic valve regurgitation is not  visualized. No aortic stenosis is present.  8. The inferior vena cava is dilated in size with <50% respiratory  variability, suggesting right atrial pressure of 15 mmHg.          )       Anesthesia Quick Evaluation

## 2019-12-02 NOTE — Progress Notes (Signed)
Anesthesia Chart Review   Case: [id number] Date/Time: 12/03/19 0715   Procedure: LEFT TOTAL KNEE REVISION  OF POYLY EXCHANGE (Left Knee)   Anesthesia type: Spinal   Pre-op diagnosis: PAINFUL LEFT KNEE REPLACEMENT   Location: [facility] ROOM 07 / [facility]   Surgeons: [name], MD      DISCUSSION:86 y.o. never smoker with h/o GERD, chronic systolic and diastolic heart failure, A-fib (on Eliquis), pulmonary HTN, LBBB, painful left knee replacement scheduled for above procedure 12/03/2019 with Dr. [name].   Pt last seen by cardiology 11/26/2019. Per OV note, "Ms. Goering needs surgery on her L knee.  Her main risks are severe pulmonary hypertension, chronic systolic and diastolic heart failure, atrial fibrillation and age.  She tolerated hip surgery earlier this year.  She is unable to tolerate more than 4 metabolic equivalents of exercise.  Her main limitation is mostly her knee giving out.  They are able to do the surgery with spinal anesthesia and not general anesthesia.  She would be at acceptable risk for surgery.  However we will need to be mindful of not giving excess IV fluids and monitoring her heart rhythm.  She has informed me that she would spend the night in the hospital.  We will be readily available if needed.  OK to hold Eliquis per anesthesia's recommendations pre-operatively."  Pt reports to PAT nurse last dose of Eliquis 11/27/2019.    S/p left total hip 08/05/2019 with no anesthesia complications noted.   Anticipate pt can proceed with planned procedure barring acute status change and after evaluation DOS, pt SDW.    VS: BP 129/84   Pulse 79   Temp 36.6 C (Oral)   Resp 18   Ht 5\' 5"  (1.651 m)   Wt 69.4 kg   SpO2 94%   BMI 25.46 kg/m   PROVIDERS: , MD is PCP   [name], MD is Cardiologist  LABS: Labs reviewed: Acceptable for surgery. (all labs ordered are listed, but only abnormal results are displayed)  Labs Reviewed  CBC WITH  DIFFERENTIAL/PLATELET - Abnormal; Notable for the following components:      Result Value   WBC 13.8 (*)    Neutro Abs 9.8 (*)    Monocytes Absolute 1.1 (*)    Abs Immature Granulocytes 0.10 (*)    All other components within normal limits  URINALYSIS, ROUTINE W REFLEX MICROSCOPIC - Abnormal; Notable for the following components:   Leukocytes,Ua TRACE (*)    Bacteria, UA RARE (*)    All other components within normal limits  SURGICAL PCR SCREEN  APTT  BASIC METABOLIC PANEL  PROTIME-INR  TYPE AND SCREEN     IMAGES:   EKG: 10/30/2019 Rate 75 bpm Possible atrial fibrillation  LBBB and left axis No acute changes   CV: Echo 08/05/2019 IMPRESSIONS    1. Left ventricular ejection fraction, by estimation, is 35%. The left  ventricle has moderate to severely decreased function. The left ventricle  demonstrates global hypokinesis. There is moderate left ventricular  hypertrophy. Left ventricular diastolic  parameters are indeterminate. There is the interventricular septum is  flattened in diastole ('D' shaped left ventricle), consistent with right  ventricular volume overload.  2. Right ventricular systolic function is moderately reduced. The right  ventricular size is moderately enlarged. There is severely elevated  pulmonary artery systolic pressure. The estimated right ventricular  systolic pressure is 83.2 mmHg.  3. Left atrial size was mildly dilated.  4. Right atrial size was severely  dilated.  5. The mitral valve is normal in structure. No evidence of mitral valve  regurgitation. No evidence of mitral stenosis.  6. The tricuspid valve is degenerative. Tricuspid valve regurgitation is  severe.  7. The aortic valve is grossly normal. Aortic valve regurgitation is not  visualized. No aortic stenosis is present.  8. The inferior vena cava is dilated in size with <50% respiratory  variability, suggesting right atrial pressure of 15 mmHg. Past Medical History:   Diagnosis Date  . Acute combined systolic and diastolic heart failure (HCC) 05/02/2017  . Anemia   . Anxiety   . Arthritis    "knees" (06/18/2015)  . Atrial fibrillation with RVR (HCC) 06/18/2015   h/o sig bleed on coumadin  . Cardiogenic shock (HCC) 05/02/2017  . CHF (congestive heart failure) (HCC)   . Complication of anesthesia 2010   "w/cardiac cath; got into a psychotic state for ~ 3 days; got me out w/Valium"  . Depression    "I have it off and on; not as often as I've gotten older" (06/18/2015)  . Diverticulosis    descending and sigmoid colon--Dr. Jarold Motto  . DJD (degenerative joint disease) of knee    left  . Dyspnea   . Dysrhythmia   . E coli infection    History of  . GERD (gastroesophageal reflux disease)   . History of Clostridium difficile colitis   . IBS (irritable bowel syndrome)   . Insomnia     off chronic ambien 5mg  as of 10/11, rare/episodic use since then  DCM/CHF  . LBBB (left bundle branch block)   . Lymphocytic colitis 09/27/2016  . Moderate to severe pulmonary hypertension (HCC) 11/26/2019  . Nonischemic cardiomyopathy (HCC)    normal coronaries, EF 15-20% 04/2008, EF 50-55% 2015  . Osteoporosis    on DXA 05/2010  . Positive PPD   . Retroperitoneal bleed   . Sciatica    Right    Past Surgical History:  Procedure Laterality Date  . ANTERIOR APPROACH HEMI HIP ARTHROPLASTY Left 08/05/2019   Procedure: ANTERIOR APPROACH HEMI HIP ARTHROPLASTY;  Surgeon: 08/07/2019, MD;  Location: [facility];  Service: Orthopedics;  Laterality: Left;  . BIOPSY  11/22/2017   Procedure: BIOPSY;  Surgeon: 11/24/2017, MD;  Location: [facility];  Service: Endoscopy;;  . CARDIAC CATHETERIZATION  2010  . CARDIOVERSION N/A 06/19/2015   Procedure: CARDIOVERSION;  Surgeon: 08/19/2015, MD;  Location: [facility] ENDOSCOPY;  Service: Cardiovascular;  Laterality: N/A;  . CATARACT EXTRACTION W/ INTRAOCULAR LENS  IMPLANT, BILATERAL Bilateral   . COLONOSCOPY WITH PROPOFOL N/A 11/22/2017    Procedure: COLONOSCOPY WITH PROPOFOL;  Surgeon: 11/24/2017, MD;  Location: [facility];  Service: Endoscopy;  Laterality: N/A;  . DILATION AND CURETTAGE OF UTERUS    . TEE WITHOUT CARDIOVERSION N/A 06/19/2015   Procedure: TRANSESOPHAGEAL ECHOCARDIOGRAM (TEE);  Surgeon: 08/19/2015, MD;  Location: [facility] ENDOSCOPY;  Service: Cardiovascular;  Laterality: N/A;  . TOTAL KNEE ARTHROPLASTY Left 04/25/2017   Procedure: TOTAL KNEE ARTHROPLASTY;  Surgeon: 04/27/2017, MD;  Location: [facility] OR;  Service: Orthopedics;  Laterality: Left;  [name] VAGINAL HYSTERECTOMY  1979   ovaries intact    MEDICATIONS: . acetaminophen (TYLENOL) 500 MG tablet  . AZO-CRANBERRY PO  . BIOTIN PO  . calcium carbonate (TUMS - DOSED IN MG ELEMENTAL CALCIUM) 500 MG chewable tablet  . Carboxymethylcellul-Glycerin ([employer] LUBRICATING/DRY EYE OP)  . Cholecalciferol (VITAMIN D) 50 MCG (2000 UT) CAPS  . cholecalciferol (VITAMIN D3) 25 MCG (  1000 UNIT) tablet  . digoxin (LANOXIN) 0.125 MG tablet  . ELIQUIS 2.5 MG TABS tablet  . loperamide (IMODIUM) 2 MG capsule  . metoprolol succinate (TOPROL-XL) 50 MG 24 hr tablet  . mirabegron ER (MYRBETRIQ) 25 MG TB24 tablet  . OVER THE COUNTER MEDICATION  . potassium chloride SA (KLOR-CON) 20 MEQ tablet  . predniSONE (DELTASONE) 10 MG tablet  . Probiotic Product (PROBIOTIC-10 PO)  . sertraline (ZOLOFT) 50 MG tablet  . tizanidine (ZANAFLEX) 2 MG capsule  . traMADol (ULTRAM) 50 MG tablet  . trolamine salicylate (ASPERCREME) 10 % cream   No current facility-administered medications for this encounter.   Melene Muller ON 12/03/2019] bupivacaine liposome (EXPAREL) 1.3 % injection 266 mg  . [START ON 12/03/2019] tranexamic acid (CYKLOKAPRON) 2,000 mg in sodium chloride 0.9 % 50 mL Topical Application    Jodell Cipro, PA-C [facility] Pre-Surgical Testing [phone]

## 2019-12-02 NOTE — H&P (Signed)
TOTAL KNEE REVISION ADMISSION H&P  Patient is being admitted for left revision total knee arthroplasty.  Subjective:  Chief Complaint:left knee pain.  HPI: [name], 86 y.o. female, has a history of pain and functional disability in the left knee(s) due to failed previous arthroplasty and patient has failed non-surgical conservative treatments for greater than 12 weeks to include flexibility and strengthening excercises, supervised PT with diminished ADL's post treatment, use of assistive devices and activity modification. The indications for the revision of the total knee arthroplasty are fracture or mechanical failure of one or components. Onset of symptoms was gradual starting 2 years ago with gradually worsening course since that time.  Prior procedures on the left knee(s) include arthroplasty.  Patient currently rates pain in the left knee(s) at 10 out of 10 with activity. There is worsening of pain with activity and weight bearing and pain that interferes with activities of daily living.  Patient has evidence of well aligned TKR by imaging studies. This condition presents safety issues increasing the risk of falls. There is no current active infection.  Patient Active Problem List   Diagnosis Date Noted  . Moderate to severe pulmonary hypertension (HCC) 11/26/2019  . Tremor of both hands 09/29/2019  . Dysuria 08/06/2019  . Acute kidney injury (HCC) 08/05/2019  . Severe tricuspid regurgitation   . S/P hip hemiarthroplasty 08/04/2019  . Ear symptom 12/30/2018  . Chronic diarrhea 09/07/2017  . Hoarseness of voice 09/07/2017  . Total knee replacement status, left 04/25/17 05/06/2017  . Acute combined systolic and diastolic heart failure (HCC) 05/02/2017  . Primary osteoarthritis of left knee 04/25/2017  . DJD (degenerative joint disease) 04/25/2017  . Healthcare maintenance 02/08/2017  . Vitamin D deficiency 02/08/2017  . Microscopic colitis 09/27/2016  . IBS (irritable bowel  syndrome) 08/31/2016  . At risk for falling 01/07/2016  . Chronic combined systolic and diastolic heart failure (HCC)   . Rectus sheath hematoma- no anticoagulation   . Atrial fibrillation with RVR- CHADs VASc=4 06/18/2015  . Diarrhea 06/12/2015  . Advance care planning 10/23/2014  . Leg edema 06/27/2012  . Medicare annual wellness visit, subsequent 03/27/2012  . Depression 03/05/2012  . Acute lower UTI 03/05/2012  . EDEMA 06/07/2010  . Osteoporosis 05/23/2010  . KNEE PAIN, LEFT 03/02/2010  . LBBB (left bundle branch block) 10/28/2008  . POST MI SEPTAL DEFECT 06/24/2008  . Cough 06/24/2008  . Nonischemic cardiomyopathy (HCC) 05/30/2008  . ATRIAL FIBRILLATION 05/30/2008  . HYPONATREMIA, HX OF 05/30/2008   Past Medical History:  Diagnosis Date  . Acute combined systolic and diastolic heart failure (HCC) 05/02/2017  . Anemia   . Anxiety   . Arthritis    "knees" (06/18/2015)  . Atrial fibrillation with RVR (HCC) 06/18/2015   h/o sig bleed on coumadin  . Cardiogenic shock (HCC) 05/02/2017  . CHF (congestive heart failure) (HCC)   . Complication of anesthesia 2010   "w/cardiac cath; got into a psychotic state for ~ 3 days; got me out w/Valium"  . Depression    "I have it off and on; not as often as I've gotten older" (06/18/2015)  . Diverticulosis    descending and sigmoid colon--Dr. Jarold Motto  . DJD (degenerative joint disease) of knee    left  . Dyspnea   . Dysrhythmia   . E coli infection    History of  . GERD (gastroesophageal reflux disease)   . History of Clostridium difficile colitis   . IBS (irritable bowel syndrome)   .  Insomnia     off chronic ambien 5mg  as of 10/11, rare/episodic use since then  DCM/CHF  . LBBB (left bundle branch block)   . Lymphocytic colitis 09/27/2016  . Moderate to severe pulmonary hypertension (HCC) 11/26/2019  . Nonischemic cardiomyopathy (HCC)    normal coronaries, EF 15-20% 04/2008, EF 50-55% 2015  . Osteoporosis    on DXA 05/2010  .  Positive PPD   . Retroperitoneal bleed   . Sciatica    Right    Past Surgical History:  Procedure Laterality Date  . ANTERIOR APPROACH HEMI HIP ARTHROPLASTY Left 08/05/2019   Procedure: ANTERIOR APPROACH HEMI HIP ARTHROPLASTY;  Surgeon: 08/07/2019, MD;  Location: [facility] OR;  Service: Orthopedics;  Laterality: Left;  . BIOPSY  11/22/2017   Procedure: BIOPSY;  Surgeon: 11/24/2017, MD;  Location: [facility] ENDOSCOPY;  Service: Endoscopy;;  . CARDIAC CATHETERIZATION  2010  . CARDIOVERSION N/A 06/19/2015   Procedure: CARDIOVERSION;  Surgeon: 08/19/2015, MD;  Location: [facility] ENDOSCOPY;  Service: Cardiovascular;  Laterality: N/A;  . CATARACT EXTRACTION W/ INTRAOCULAR LENS  IMPLANT, BILATERAL Bilateral   . COLONOSCOPY WITH PROPOFOL N/A 11/22/2017   Procedure: COLONOSCOPY WITH PROPOFOL;  Surgeon: 11/24/2017, MD;  Location: [facility] ENDOSCOPY;  Service: Endoscopy;  Laterality: N/A;  . DILATION AND CURETTAGE OF UTERUS    . TEE WITHOUT CARDIOVERSION N/A 06/19/2015   Procedure: TRANSESOPHAGEAL ECHOCARDIOGRAM (TEE);  Surgeon: 08/19/2015, MD;  Location: [facility] ENDOSCOPY;  Service: Cardiovascular;  Laterality: N/A;  . TOTAL KNEE ARTHROPLASTY Left 04/25/2017   Procedure: TOTAL KNEE ARTHROPLASTY;  Surgeon: 04/27/2017, MD;  Location: [facility] OR;  Service: Orthopedics;  Laterality: Left;  [name] VAGINAL HYSTERECTOMY  1979   ovaries intact    Current Facility-Administered Medications  Medication Dose Route Frequency Provider Last Rate Last Admin  . [START ON 12/03/2019] bupivacaine liposome (EXPAREL) 1.3 % injection 266 mg  20 mL Other On Call to OR 12/05/2019, [facility]      . [START ON 12/03/2019] tranexamic acid (CYKLOKAPRON) 2,000 mg in sodium chloride 0.9 % 50 mL Topical Application  2,000 mg Topical On Call to OR 12/05/2019, [facility]       Current Outpatient Medications  Medication Sig Dispense Refill Last Dose  . acetaminophen (TYLENOL) 500 MG tablet Take 1,000 mg by mouth 2 (two) times daily as needed for  moderate pain or headache.      . AZO-CRANBERRY PO Take 1 tablet by mouth daily.     . calcium carbonate (TUMS - DOSED IN MG ELEMENTAL CALCIUM) 500 MG chewable tablet Chew 1 tablet by mouth 3 (three) times daily as needed for indigestion or heartburn.      . Carboxymethylcellul-Glycerin ([employer] LUBRICATING/DRY EYE OP) Place 1-2 drops into both eyes daily as needed (for dry eyes).     . cholecalciferol (VITAMIN D3) 25 MCG (1000 UNIT) tablet Take 1,000 Units by mouth daily.     . digoxin (LANOXIN) 0.125 MG tablet Take 1 tablet (0.125 mg total) by mouth daily. 90 tablet 3   . loperamide (IMODIUM) 2 MG capsule Take 2 mg by mouth 3 (three) times daily as needed for diarrhea or loose stools.      . metoprolol succinate (TOPROL-XL) 50 MG 24 hr tablet Take 1.5 tablets (75 mg total) by mouth daily. Take with or immediately following a meal. (Patient taking differently: Take 75 mg by mouth daily. Take with or immediately following a meal.)     . potassium chloride SA (  KLOR-CON) 20 MEQ tablet Take 20 mEq by mouth daily.     . predniSONE (DELTASONE) 10 MG tablet Take 2 tablets (20 mg total) by mouth daily with breakfast. (Patient taking differently: Take 10 mg by mouth daily with breakfast. ) 100 tablet 0   . sertraline (ZOLOFT) 50 MG tablet Take 3 tablets (150 mg total) by mouth daily. (Patient taking differently: Take 50 mg by mouth daily. )     . tizanidine (ZANAFLEX) 2 MG capsule Take 2 mg by mouth 2 (two) times daily as needed for muscle spasms.     . traMADol (ULTRAM) 50 MG tablet Take 1 tablet (50 mg total) by mouth every 6 (six) hours as needed for moderate pain. 10 tablet 0   . trolamine salicylate (ASPERCREME) 10 % cream Apply 1 application topically 2 (two) times daily as needed (knee pain).     [name] BIOTIN PO Take 1 tablet by mouth daily. (Patient not taking: Reported on 11/28/2019)   Not Taking at Unknown time  . Cholecalciferol (VITAMIN D) 50 MCG (2000 UT) CAPS Take 1 capsule (2,000 Units total) by mouth  daily. (Patient not taking: Reported on 11/28/2019)   Not Taking at Unknown time  . ELIQUIS 2.5 MG TABS tablet TAKE 1 TABLET BY MOUTH TWICE A DAY (Patient not taking: Reported on 11/28/2019) 180 tablet 1 Not Taking at Unknown time  . mirabegron ER (MYRBETRIQ) 25 MG TB24 tablet Take 1 tablet (25 mg total) by mouth daily. (Patient not taking: Reported on 11/28/2019) 30 tablet 1 Not Taking at Unknown time  . OVER THE COUNTER MEDICATION Apply 1 application topically See admin instructions. Hemp Cream - apply topically to knees daily (Patient not taking: Reported on 11/28/2019)   Not Taking at Unknown time  . Probiotic Product (PROBIOTIC-10 PO) Take 1 tablet by mouth daily.  (Patient not taking: Reported on 11/28/2019)   Not Taking at Unknown time   Allergies  Allergen Reactions  . Ace Inhibitors Cough  . Warfarin Sodium Other (See Comments)    DOSE RELATED PHARMACOLOGIC EFFECT "bleed out"  . Famotidine Other (See Comments)    GI upset  . Codeine Other (See Comments)    sedation  . Delsym [Dextromethorphan Polistirex Er] Other (See Comments)    dizziness  . Lactose Intolerance (Gi) Diarrhea  . Phenylephrine Palpitations and Other (See Comments)    Nasal spray- "likely increase in nasal congestion".   . Sulfamethoxazole-Trimethoprim Nausea And Vomiting    GI intolerance.    Social History   Tobacco Use  . Smoking status: Never Smoker  . Smokeless tobacco: Never Used  Substance Use Topics  . Alcohol use: No    Alcohol/week: 0.0 standard drinks    Family History  Problem Relation Age of Onset  . Colon cancer Father        possible colon cancer  . Other Father        esophagus tumor?  . Tuberculosis Mother   . Breast cancer Neg Hx   . Stomach cancer Neg Hx   . Pancreatic cancer Neg Hx       Review of Systems  Musculoskeletal:       Left knee pain and instability  All other systems reviewed and are negative.    Objective:  Physical Exam Constitutional:      Appearance: Normal  appearance. She is normal weight.  HENT:     Head: Normocephalic and atraumatic.     Mouth/Throat:     Mouth: Mucous membranes are  moist.     Pharynx: Oropharynx is clear.  Eyes:     Extraocular Movements: Extraocular movements intact.  Cardiovascular:     Rate and Rhythm: Normal rate.  Pulmonary:     Effort: Pulmonary effort is normal.  Abdominal:     Palpations: Abdomen is soft.  Musculoskeletal:     Cervical back: Normal range of motion.     Comments: Her left knee motion is fairly good from full extension to 100 of flexion.  She does have some instability in extension and flexion.  She can demonstrate a subluxing tibial component.  There is no effusion.  Calf is soft and nontender.   Skin:    General: Skin is warm and dry.  Neurological:     General: No focal deficit present.     Mental Status: She is alert and oriented to person, place, and time.  Psychiatric:        Mood and Affect: Mood normal.        Behavior: Behavior normal.        Thought Content: Thought content normal.        Judgment: Judgment normal.     Vital signs in last 24 hours: Temp:  [97.9 F (36.6 C)] 97.9 F (36.6 C) (08/23 0907) Pulse Rate:  [79] 79 (08/23 0907) Resp:  [18] 18 (08/23 0907) BP: (129)/(84) 129/84 (08/23 0907) SpO2:  [94 %] 94 % (08/23 0907) Weight:  [69.4 kg] 69.4 kg (08/23 0907)  Labs:  Estimated body mass index is 25.46 kg/m as calculated from the following:   Height as of 12/02/19: 5\' 5"  (1.651 m).   Weight as of 12/02/19: 69.4 kg.  Imaging Review Plain radiographs demonstrate well aligned TKR on the left. The overall alignment is neutral. The bone quality appears to be good for age and reported activity level.  Assessment/Plan:  End stage arthritis, left knee(s) with failed previous arthroplasty.   The patient history, physical examination, clinical judgment of the provider and imaging studies are consistent with end stage degenerative joint disease of the left knee(s),  previous total knee arthroplasty. Revision total knee arthroplasty is deemed medically necessary. The treatment options including medical management, injection therapy, arthroscopy and revision arthroplasty were discussed at length. The risks and benefits of revision total knee arthroplasty were presented and reviewed. The risks due to aseptic loosening, infection, stiffness, patella tracking problems, thromboembolic complications and other imponderables were discussed. The patient acknowledged the explanation, agreed to proceed with the plan and consent was signed. Patient is being admitted for inpatient treatment for surgery, pain control, PT, OT, prophylactic antibiotics, VTE prophylaxis, progressive ambulation and ADL's and discharge planning.The patient is planning to be discharged home with home health services

## 2019-12-03 ENCOUNTER — Encounter (HOSPITAL_COMMUNITY): Payer: Self-pay | Admitting: Orthopaedic Surgery

## 2019-12-03 ENCOUNTER — Encounter (HOSPITAL_COMMUNITY)
Admission: RE | Disposition: A | Discharge status: Home / Self Care | Payer: Self-pay | Source: Home / Self Care | Attending: Orthopaedic Surgery

## 2019-12-03 ENCOUNTER — Observation Stay (HOSPITAL_COMMUNITY)
Admission: RE | Admit: 2019-12-03 | Discharge: 2019-12-04 | Disposition: A | Discharge status: Home / Self Care | Payer: Medicare Other | Attending: Orthopaedic Surgery | Admitting: Orthopaedic Surgery

## 2019-12-03 ENCOUNTER — Ambulatory Visit (HOSPITAL_COMMUNITY): Payer: Medicare Other | Admitting: Certified Registered Nurse Anesthetist

## 2019-12-03 ENCOUNTER — Ambulatory Visit (HOSPITAL_COMMUNITY): Payer: Medicare Other | Admitting: Physician Assistant

## 2019-12-03 DIAGNOSIS — T84033A Mechanical loosening of internal left knee prosthetic joint, initial encounter: Secondary | ICD-10-CM | POA: Diagnosis not present

## 2019-12-03 DIAGNOSIS — Y939 Activity, unspecified: Secondary | ICD-10-CM | POA: Diagnosis not present

## 2019-12-03 DIAGNOSIS — Y999 Unspecified external cause status: Secondary | ICD-10-CM | POA: Insufficient documentation

## 2019-12-03 DIAGNOSIS — Z79899 Other long term (current) drug therapy: Secondary | ICD-10-CM | POA: Diagnosis not present

## 2019-12-03 DIAGNOSIS — R262 Difficulty in walking, not elsewhere classified: Secondary | ICD-10-CM | POA: Diagnosis not present

## 2019-12-03 DIAGNOSIS — I110 Hypertensive heart disease with heart failure: Secondary | ICD-10-CM | POA: Diagnosis not present

## 2019-12-03 DIAGNOSIS — I5042 Chronic combined systolic (congestive) and diastolic (congestive) heart failure: Secondary | ICD-10-CM | POA: Diagnosis not present

## 2019-12-03 DIAGNOSIS — I5043 Acute on chronic combined systolic (congestive) and diastolic (congestive) heart failure: Secondary | ICD-10-CM | POA: Diagnosis not present

## 2019-12-03 DIAGNOSIS — Z01818 Encounter for other preprocedural examination: Secondary | ICD-10-CM

## 2019-12-03 DIAGNOSIS — Z96652 Presence of left artificial knee joint: Secondary | ICD-10-CM | POA: Diagnosis present

## 2019-12-03 DIAGNOSIS — M25562 Pain in left knee: Secondary | ICD-10-CM | POA: Diagnosis present

## 2019-12-03 DIAGNOSIS — T8484XA Pain due to internal orthopedic prosthetic devices, implants and grafts, initial encounter: Secondary | ICD-10-CM | POA: Diagnosis not present

## 2019-12-03 DIAGNOSIS — G8918 Other acute postprocedural pain: Secondary | ICD-10-CM | POA: Diagnosis not present

## 2019-12-03 DIAGNOSIS — Y838 Other surgical procedures as the cause of abnormal reaction of the patient, or of later complication, without mention of misadventure at the time of the procedure: Secondary | ICD-10-CM | POA: Diagnosis not present

## 2019-12-03 DIAGNOSIS — I4891 Unspecified atrial fibrillation: Secondary | ICD-10-CM | POA: Insufficient documentation

## 2019-12-03 DIAGNOSIS — T84023A Instability of internal left knee prosthesis, initial encounter: Secondary | ICD-10-CM | POA: Diagnosis not present

## 2019-12-03 DIAGNOSIS — Y929 Unspecified place or not applicable: Secondary | ICD-10-CM | POA: Diagnosis not present

## 2019-12-03 HISTORY — PX: TOTAL KNEE REVISION: SHX996

## 2019-12-03 LAB — TYPE AND SCREEN
ABO/RH(D): O NEG
Antibody Screen: NEGATIVE

## 2019-12-03 SURGERY — TOTAL KNEE REVISION
Anesthesia: Spinal | Site: Knee | Laterality: Left

## 2019-12-03 MED ORDER — SODIUM CHLORIDE (PF) 0.9 % IJ SOLN
INTRAMUSCULAR | Status: AC
  Filled 2019-12-03: qty 50

## 2019-12-03 MED ORDER — STERILE WATER FOR IRRIGATION IR SOLN
Status: DC | PRN
  Administered 2019-12-03: 1000 mL

## 2019-12-03 MED ORDER — SODIUM CHLORIDE 0.9 % IR SOLN
Status: DC | PRN
  Administered 2019-12-03: 3000 mL

## 2019-12-03 MED ORDER — TRANEXAMIC ACID-NACL 1000-0.7 MG/100ML-% IV SOLN
INTRAVENOUS | Status: AC | PRN
  Administered 2019-12-03: 2000 mg via INTRAVENOUS

## 2019-12-03 MED ORDER — LACTATED RINGERS IV SOLN: INTRAVENOUS | Status: DC

## 2019-12-03 MED ORDER — ONDANSETRON HCL 4 MG/2ML IJ SOLN
INTRAMUSCULAR | Status: DC | PRN
  Administered 2019-12-03: 4 mg via INTRAVENOUS

## 2019-12-03 MED ORDER — BUPIVACAINE-EPINEPHRINE (PF) 0.25% -1:200000 IJ SOLN
INTRAMUSCULAR | Status: AC
  Filled 2019-12-03: qty 30

## 2019-12-03 MED ORDER — BUPIVACAINE IN DEXTROSE 0.75-8.25 % IT SOLN
INTRATHECAL | Status: DC | PRN
  Administered 2019-12-03: 1.6 mL via INTRATHECAL

## 2019-12-03 MED ORDER — ONDANSETRON HCL 4 MG/2ML IJ SOLN
INTRAMUSCULAR | Status: AC
  Filled 2019-12-03: qty 2

## 2019-12-03 MED ORDER — 0.9 % SODIUM CHLORIDE (POUR BTL) OPTIME
TOPICAL | Status: DC | PRN
  Administered 2019-12-03: 1000 mL

## 2019-12-03 MED ORDER — KETOROLAC TROMETHAMINE 15 MG/ML IJ SOLN
7.5000 mg | Freq: Four times a day (QID) | INTRAMUSCULAR | Status: AC
  Administered 2019-12-03 (×3): 7.5 mg via INTRAVENOUS
  Filled 2019-12-03 (×3): qty 1

## 2019-12-03 MED ORDER — PROPOFOL 10 MG/ML IV BOLUS
INTRAVENOUS | Status: AC
  Filled 2019-12-03: qty 40

## 2019-12-03 MED ORDER — METOCLOPRAMIDE HCL 5 MG/ML IJ SOLN: 5.0000 mg | Freq: Three times a day (TID) | INTRAMUSCULAR | Status: DC | PRN

## 2019-12-03 MED ORDER — DIPHENHYDRAMINE HCL 12.5 MG/5ML PO ELIX: 12.5000 mg | ORAL_SOLUTION | ORAL | Status: DC | PRN

## 2019-12-03 MED ORDER — PROPOFOL 10 MG/ML IV BOLUS
INTRAVENOUS | Status: DC | PRN
  Administered 2019-12-03 (×5): 10 mg via INTRAVENOUS

## 2019-12-03 MED ORDER — METHOCARBAMOL 500 MG PO TABS: 500.0000 mg | ORAL_TABLET | Freq: Four times a day (QID) | ORAL | Status: DC | PRN

## 2019-12-03 MED ORDER — POVIDONE-IODINE 10 % EX SWAB
2.0000 "application " | Freq: Once | CUTANEOUS | Status: AC
  Administered 2019-12-03: 2 via TOPICAL

## 2019-12-03 MED ORDER — SODIUM CHLORIDE (PF) 0.9 % IJ SOLN
INTRAMUSCULAR | Status: DC | PRN
  Administered 2019-12-03: 30 mL

## 2019-12-03 MED ORDER — SENNOSIDES-DOCUSATE SODIUM 8.6-50 MG PO TABS: 1.0000 | ORAL_TABLET | Freq: Every evening | ORAL | Status: DC | PRN

## 2019-12-03 MED ORDER — CEFAZOLIN SODIUM-DEXTROSE 2-4 GM/100ML-% IV SOLN
2.0000 g | INTRAVENOUS | Status: AC
  Administered 2019-12-03: 2 g via INTRAVENOUS
  Filled 2019-12-03: qty 100

## 2019-12-03 MED ORDER — ACETAMINOPHEN 500 MG PO TABS
500.0000 mg | ORAL_TABLET | Freq: Four times a day (QID) | ORAL | Status: AC
  Administered 2019-12-03 – 2019-12-04 (×4): 500 mg via ORAL
  Filled 2019-12-03 (×4): qty 1

## 2019-12-03 MED ORDER — ONDANSETRON HCL 4 MG PO TABS: 4.0000 mg | ORAL_TABLET | Freq: Four times a day (QID) | ORAL | Status: DC | PRN

## 2019-12-03 MED ORDER — ACETAMINOPHEN 325 MG PO TABS: 325.0000 mg | ORAL_TABLET | Freq: Four times a day (QID) | ORAL | Status: DC | PRN

## 2019-12-03 MED ORDER — FENTANYL CITRATE (PF) 100 MCG/2ML IJ SOLN
INTRAMUSCULAR | Status: DC | PRN
Start: 2019-12-03 — End: 2019-12-03
  Administered 2019-12-03 (×2): 25 ug via INTRAVENOUS

## 2019-12-03 MED ORDER — BISACODYL 5 MG PO TBEC: 5.0000 mg | DELAYED_RELEASE_TABLET | Freq: Every day | ORAL | Status: DC | PRN

## 2019-12-03 MED ORDER — APIXABAN 2.5 MG PO TABS
2.5000 mg | ORAL_TABLET | Freq: Two times a day (BID) | ORAL | Status: DC
  Administered 2019-12-04: 2.5 mg via ORAL
  Filled 2019-12-03: qty 1

## 2019-12-03 MED ORDER — TRANEXAMIC ACID-NACL 1000-0.7 MG/100ML-% IV SOLN
1000.0000 mg | INTRAVENOUS | Status: AC
  Administered 2019-12-03: 1000 mg via INTRAVENOUS
  Filled 2019-12-03: qty 100

## 2019-12-03 MED ORDER — METOPROLOL SUCCINATE ER 50 MG PO TB24
75.0000 mg | ORAL_TABLET | Freq: Every day | ORAL | Status: DC
  Administered 2019-12-04: 75 mg via ORAL
  Filled 2019-12-03: qty 1

## 2019-12-03 MED ORDER — ROPIVACAINE HCL 7.5 MG/ML IJ SOLN
INTRAMUSCULAR | Status: DC | PRN
  Administered 2019-12-03: 20 mL via PERINEURAL

## 2019-12-03 MED ORDER — DEXAMETHASONE SODIUM PHOSPHATE 10 MG/ML IJ SOLN
INTRAMUSCULAR | Status: AC
  Filled 2019-12-03: qty 1

## 2019-12-03 MED ORDER — METOCLOPRAMIDE HCL 5 MG PO TABS: 5.0000 mg | ORAL_TABLET | Freq: Three times a day (TID) | ORAL | Status: DC | PRN

## 2019-12-03 MED ORDER — FENTANYL CITRATE (PF) 100 MCG/2ML IJ SOLN
INTRAMUSCULAR | Status: AC
  Filled 2019-12-03: qty 2

## 2019-12-03 MED ORDER — PROPOFOL 500 MG/50ML IV EMUL
INTRAVENOUS | Status: DC | PRN
  Administered 2019-12-03: 25 ug/kg/min via INTRAVENOUS

## 2019-12-03 MED ORDER — DIGOXIN 125 MCG PO TABS
0.1250 mg | ORAL_TABLET | Freq: Every day | ORAL | Status: DC
  Administered 2019-12-04: 0.125 mg via ORAL
  Filled 2019-12-03: qty 1

## 2019-12-03 MED ORDER — ORAL CARE MOUTH RINSE: 15.0000 mL | Freq: Once | OROMUCOSAL | Status: AC

## 2019-12-03 MED ORDER — DOCUSATE SODIUM 100 MG PO CAPS
100.0000 mg | ORAL_CAPSULE | Freq: Two times a day (BID) | ORAL | Status: DC
  Administered 2019-12-04: 100 mg via ORAL
  Filled 2019-12-03 (×2): qty 1

## 2019-12-03 MED ORDER — PHENYLEPHRINE HCL-NACL 20-0.9 MG/250ML-% IV SOLN
INTRAVENOUS | Status: DC | PRN
  Administered 2019-12-03: 20 ug/min via INTRAVENOUS

## 2019-12-03 MED ORDER — PREDNISONE 5 MG PO TABS
10.0000 mg | ORAL_TABLET | Freq: Every day | ORAL | Status: DC
  Administered 2019-12-04: 10 mg via ORAL
  Filled 2019-12-03: qty 2

## 2019-12-03 MED ORDER — TRAMADOL HCL 50 MG PO TABS: 50.0000 mg | ORAL_TABLET | Freq: Four times a day (QID) | ORAL | Status: DC | PRN

## 2019-12-03 MED ORDER — BUPIVACAINE LIPOSOME 1.3 % IJ SUSP
INTRAMUSCULAR | Status: DC | PRN
  Administered 2019-12-03: 20 mL

## 2019-12-03 MED ORDER — BUPIVACAINE-EPINEPHRINE (PF) 0.25% -1:200000 IJ SOLN
INTRAMUSCULAR | Status: DC | PRN
  Administered 2019-12-03: 30 mL

## 2019-12-03 MED ORDER — METHOCARBAMOL 500 MG IVPB - SIMPLE MED
500.0000 mg | Freq: Four times a day (QID) | INTRAVENOUS | Status: DC | PRN
  Filled 2019-12-03: qty 50

## 2019-12-03 MED ORDER — PHENYLEPHRINE HCL-NACL 10-0.9 MG/250ML-% IV SOLN
INTRAVENOUS | Status: AC
  Filled 2019-12-03: qty 250

## 2019-12-03 MED ORDER — CHLORHEXIDINE GLUCONATE 0.12 % MT SOLN
15.0000 mL | Freq: Once | OROMUCOSAL | Status: AC
  Administered 2019-12-03: 15 mL via OROMUCOSAL

## 2019-12-03 MED ORDER — ONDANSETRON HCL 4 MG/2ML IJ SOLN: 4.0000 mg | Freq: Four times a day (QID) | INTRAMUSCULAR | Status: DC | PRN

## 2019-12-03 MED ORDER — DEXAMETHASONE SODIUM PHOSPHATE 10 MG/ML IJ SOLN
INTRAMUSCULAR | Status: DC | PRN
  Administered 2019-12-03: 6 mg via INTRAVENOUS

## 2019-12-03 MED ORDER — SERTRALINE HCL 50 MG PO TABS
50.0000 mg | ORAL_TABLET | Freq: Every day | ORAL | Status: DC
  Administered 2019-12-03 – 2019-12-04 (×2): 50 mg via ORAL
  Filled 2019-12-03 (×2): qty 1

## 2019-12-03 MED ORDER — POTASSIUM CHLORIDE CRYS ER 20 MEQ PO TBCR
20.0000 meq | EXTENDED_RELEASE_TABLET | Freq: Every day | ORAL | Status: DC
  Administered 2019-12-03: 20 meq via ORAL
  Filled 2019-12-03: qty 1

## 2019-12-03 MED ORDER — FENTANYL CITRATE (PF) 100 MCG/2ML IJ SOLN: 25.0000 ug | INTRAMUSCULAR | Status: DC | PRN

## 2019-12-03 SURGICAL SUPPLY — 50 items
BAG ZIPLOCK 12X15 (MISCELLANEOUS) ×2 IMPLANT
BLADE SURG SZ10 CARB STEEL (BLADE) ×4 IMPLANT
BNDG ELASTIC 4X5.8 VLCR STR LF (GAUZE/BANDAGES/DRESSINGS) ×2 IMPLANT
BNDG ELASTIC 6X10 VLCR STRL LF (GAUZE/BANDAGES/DRESSINGS) ×2 IMPLANT
BNDG ELASTIC 6X5.8 VLCR STR LF (GAUZE/BANDAGES/DRESSINGS) ×2 IMPLANT
BOOTIES KNEE HIGH SLOAN (MISCELLANEOUS) ×2 IMPLANT
COVER WAND RF STERILE (DRAPES) IMPLANT
CUFF TOURN SGL QUICK 34 (TOURNIQUET CUFF) ×2
CUFF TRNQT CYL 34X4.125X (TOURNIQUET CUFF) ×1 IMPLANT
DECANTER SPIKE VIAL GLASS SM (MISCELLANEOUS) IMPLANT
DRAPE ORTHO SPLIT 77X108 STRL (DRAPES) ×4
DRAPE SURG ORHT 6 SPLT 77X108 (DRAPES) ×2 IMPLANT
DRAPE U-SHAPE 47X51 STRL (DRAPES) ×2 IMPLANT
DRSG ADAPTIC 3X8 NADH LF (GAUZE/BANDAGES/DRESSINGS) ×2 IMPLANT
DRSG AQUACEL AG ADV 3.5X10 (GAUZE/BANDAGES/DRESSINGS) ×2 IMPLANT
DRSG PAD ABDOMINAL 8X10 ST (GAUZE/BANDAGES/DRESSINGS) ×2 IMPLANT
ELECT REM PT RETURN 15FT ADLT (MISCELLANEOUS) ×2 IMPLANT
EVACUATOR 1/8 PVC DRAIN (DRAIN) ×2 IMPLANT
GLOVE BIO SURGEON STRL SZ8 (GLOVE) ×4 IMPLANT
GLOVE BIOGEL PI IND STRL 8 (GLOVE) ×2 IMPLANT
GLOVE BIOGEL PI INDICATOR 8 (GLOVE) ×2
GOWN STRL REUS W/TWL XL LVL3 (GOWN DISPOSABLE) ×4 IMPLANT
HANDPIECE INTERPULSE COAX TIP (DISPOSABLE) ×2
HOLDER FOLEY CATH W/STRAP (MISCELLANEOUS) IMPLANT
IMMOBILIZER KNEE 20 (SOFTGOODS)
IMMOBILIZER KNEE 20 THIGH 36 (SOFTGOODS) IMPLANT
INSERT LCL COMP RP STD 17.5MM (Knees) ×2 IMPLANT
KIT BASIN OR (CUSTOM PROCEDURE TRAY) ×2 IMPLANT
KIT TURNOVER KIT A (KITS) IMPLANT
NDL SAFETY ECLIPSE 18X1.5 (NEEDLE) IMPLANT
NEEDLE HYPO 18GX1.5 SHARP (NEEDLE)
NS IRRIG 1000ML POUR BTL (IV SOLUTION) ×2 IMPLANT
PACK TOTAL KNEE CUSTOM (KITS) IMPLANT
PAD CAST 4YDX4 CTTN HI CHSV (CAST SUPPLIES) ×2 IMPLANT
PADDING CAST COTTON 4X4 STRL (CAST SUPPLIES) ×4
PADDING CAST COTTON 6X4 STRL (CAST SUPPLIES) ×4 IMPLANT
PENCIL SMOKE EVACUATOR (MISCELLANEOUS) IMPLANT
PROTECTOR NERVE ULNAR (MISCELLANEOUS) ×2 IMPLANT
SET HNDPC FAN SPRY TIP SCT (DISPOSABLE) ×1 IMPLANT
SPONGE LAP 18X18 RF (DISPOSABLE) ×2 IMPLANT
STAPLER VISISTAT 35W (STAPLE) IMPLANT
SUT VIC AB 0 CT1 27 (SUTURE) ×6
SUT VIC AB 0 CT1 27XBRD ANTBC (SUTURE) ×3 IMPLANT
SUT VIC AB 1 CT1 27 (SUTURE) ×6
SUT VIC AB 1 CT1 27XBRD ANTBC (SUTURE) ×3 IMPLANT
SUT VIC AB 2-0 CT1 27 (SUTURE) ×6
SUT VIC AB 2-0 CT1 TAPERPNT 27 (SUTURE) ×3 IMPLANT
SYR 3ML LL SCALE MARK (SYRINGE) IMPLANT
TRAY FOLEY MTR SLVR 16FR STAT (SET/KITS/TRAYS/PACK) ×2 IMPLANT
WATER STERILE IRR 1000ML POUR (IV SOLUTION) ×2 IMPLANT

## 2019-12-03 NOTE — Op Note (Signed)
NAMESHAILAH, GIBBINS MEDICAL RECORD BP:[id number] ACCOUNT [id number] DATE OF BIRTH:[birth year] FACILITY: [facility] LOCATION: [facility] PHYSICIAN:Alante Tolan Reece Agar Urian Martenson, MD  OPERATIVE REPORT  DATE OF PROCEDURE:  12/03/2019  PREOPERATIVE DIAGNOSIS:  Unstable left total knee replacement.  POSTOPERATIVE DIAGNOSIS:  Unstable left total knee replacement.    PROCEDURE:  Revision left TKR.  ANESTHESIA:  Spinal and sedation.  ATTENDING SURGEON:  Geanie Pacifico  ASSISTANT:  Elodia Florence, PA  INDICATIONS:  The patient is an 86 year old woman who is about 3 years out from a left knee replacement.  Over recent months, she has developed instability where her spacer most likely is spinning.  This has become problematic to her in that her leg is  not stable.  She is again relegated to a walker.  Her tibial and femoral components appear well fixed.  She is offered a revision procedure.  Informed operative consent was obtained after discussion of possible complications including reaction to  anesthesia, infection, DVT.  SUMMARY OF FINDINGS AND PROCEDURE:  Under spinal and sedation we performed an approach to her replaced knee.  She did seem to have some instability in both extension and flexion as her soft tissues seemed to have stretched.  We removed her size 12.5  spacer and upsized her to a size 17.5.  This gave her good stability in flexion and extension and she still extended fully.  She was closed primarily and scheduled to stay at least overnight.  DESCRIPTION OF PROCEDURE:  The patient was taken to the operating suite where the aforementioned anesthetic was applied.  She was positioned supine and prepped and draped in normal sterile fashion.  After the administration of preoperative IV Kefzol and  appropriate time-out, the left leg was elevated, exsanguinated, tourniquet inflated about the thigh.  We used her old incision with dissection down to the extensor mechanism.  I performed a medial parapatellar incision  through this.  Her kneecap flipped  and we flexed her up to 130 degrees.  We placed a couple of retractors and easily removed her 12.5 spacer.  We then trialled with a 15 and subsequently a 17.5 spacer from the LCS system by [employer].  This seemed to give her good stability in both flexion  and extension and she still extended fully.  We placed a size standard 17.5 LCS spacer.  The knee was again reduced and again was stable.  We irrigated thoroughly and injected with some Exparel and Marcaine.  We then irrigated again.  The extensor  mechanism was reapproximated after release of tourniquet.  We did use a TXA sponge after release of tourniquet.  The extensor mechanism was reapproximated with #1 Ethibond in interrupted fashion.  We then reapproximated the subcutaneous tissues with  Vicryl and skin was closed with a subcuticular stitch.  We then applied a sterile dressing.  ESTIMATED BLOOD LOSS AND FLUIDS:  May be obtained from anesthesia records as can accurate tourniquet time.  DISPOSITION:  The patient was taken to recovery room in stable condition.  Plans were for her to stay at least overnight with possible discharge home tomorrow.  CN/NUANCE  D:12/03/2019 T:12/03/2019 JOB:[id number]

## 2019-12-03 NOTE — Op Note (Signed)
[name] [id number] 12/03/2019   PRE-OP DIAGNOSIS: loose left TKR  POST-OP DIAGNOSIS: same  PROCEDURE: left TKR revision  ANESTHESIA: spinal and sedation  [name]   Dictation #:  [id number]

## 2019-12-03 NOTE — Anesthesia Procedure Notes (Addendum)
Procedure Name: MAC Date/Time: 12/03/2019 7:31 AM Performed by: West Pugh, CRNA Pre-anesthesia Checklist: Patient identified, Emergency Drugs available, Suction available, Patient being monitored and Timeout performed Patient Re-evaluated:Patient Re-evaluated prior to induction Oxygen Delivery Method: Simple face mask Preoxygenation: Pre-oxygenation with 100% oxygen Induction Type: IV induction Placement Confirmation: positive ETCO2 and breath sounds checked- equal and bilateral Dental Injury: Teeth and Oropharynx as per pre-operative assessment

## 2019-12-03 NOTE — Anesthesia Procedure Notes (Signed)
Spinal  Start time: 12/03/2019 7:38 AM End time: 12/03/2019 7:40 AM Staffing Performed: anesthesiologist  Anesthesiologist: Shelton Silvas, MD Preanesthetic Checklist Completed: patient identified, IV checked, site marked, risks and benefits discussed, surgical consent, monitors and equipment checked, pre-op evaluation and timeout performed Spinal Block Patient position: sitting Prep: DuraPrep and site prepped and draped Location: L3-4 Injection technique: single-shot Needle Needle type: Pencan  Needle gauge: 24 G Needle length: 10 cm Needle insertion depth: 10 cm Additional Notes Patient tolerated well. No immediate complications.  1st attempt by CRNA

## 2019-12-03 NOTE — Anesthesia Procedure Notes (Signed)
Anesthesia Regional Block: Adductor canal block   Pre-Anesthetic Checklist: ,, timeout performed, Correct Patient, Correct Site, Correct Laterality, Correct Procedure, Correct Position, site marked, Risks and benefits discussed,  Surgical consent,  Pre-op evaluation,  At surgeon's request and post-op pain management  Laterality: Left  Prep: chloraprep       Needles:  Injection technique: Single-shot  Needle Type: Echogenic Stimulator Needle     Needle Length: 9cm  Needle Gauge: 21     Additional Needles:   Procedures:,,,, ultrasound used (permanent image in chart),,,,  Narrative:  Start time: 12/03/2019 6:55 AM End time: 12/03/2019 7:00 AM Injection made incrementally with aspirations every 5 mL.  Performed by: Personally  Anesthesiologist: Shelton Silvas, MD  Additional Notes: Patient tolerated the procedure well. Local anesthetic introduced in an incremental fashion under minimal resistance after negative aspirations. No paresthesias were elicited. After completion of the procedure, no acute issues were identified and patient continued to be monitored by RN.

## 2019-12-03 NOTE — Transfer of Care (Signed)
Immediate Anesthesia Transfer of Care Note  Patient: [name]  Procedure(s) Performed: LEFT TOTAL KNEE REVISION  OF POYLY EXCHANGE (Left Knee)  Patient Location: PACU  Anesthesia Type:Spinal and MAC combined with regional for post-op pain  Level of Consciousness: awake, alert , oriented and patient cooperative  Airway & Oxygen Therapy: Patient Spontanous Breathing and Patient connected to nasal cannula oxygen  Post-op Assessment: Report given to RN and Post -op Vital signs reviewed and stable  Post vital signs: Reviewed and stable  Last Vitals:  Vitals Value Taken Time  BP 122/73 12/03/19 0908  Temp    Pulse 64 12/03/19 0913  Resp 16 12/03/19 0913  SpO2 100 % 12/03/19 0913  Vitals shown include unvalidated device data.  Last Pain:  Vitals:   12/03/19 0623  TempSrc:   PainSc: 0-No pain         Complications: No complications documented.

## 2019-12-03 NOTE — Interval H&P Note (Signed)
History and Physical Interval Note:  12/03/2019 7:27 AM  [name]  has presented today for surgery, with the diagnosis of PAINFUL LEFT KNEE REPLACEMENT.  The various methods of treatment have been discussed with the patient and family. After consideration of risks, benefits and other options for treatment, the patient has consented to  Procedure(s): LEFT TOTAL KNEE REVISION  OF POYLY EXCHANGE (Left) as a surgical intervention.  The patient's history has been reviewed, patient examined, no change in status, stable for surgery.  I have reviewed the patient's chart and labs.  Questions were answered to the patient's satisfaction.     [name]

## 2019-12-03 NOTE — Plan of Care (Signed)
?  Problem: Education: ?Goal: Knowledge of General Education information will improve ?Description: Including pain rating scale, medication(s)/side effects and non-pharmacologic comfort measures ?Outcome: Progressing ?  ?Problem: Activity: ?Goal: Risk for activity intolerance will decrease ?Outcome: Progressing ?  ?Problem: Nutrition: ?Goal: Adequate nutrition will be maintained ?Outcome: Progressing ?  ?Problem: Elimination: ?Goal: Will not experience complications related to bowel motility ?Outcome: Progressing ?  ?Problem: Pain Managment: ?Goal: General experience of comfort will improve ?Outcome: Progressing ?  ?Problem: Safety: ?Goal: Ability to remain free from injury will improve ?Outcome: Progressing ?  ?Problem: Education: ?Goal: Knowledge of the prescribed therapeutic regimen will improve ?Outcome: Progressing ?  ?Problem: Activity: ?Goal: Ability to avoid complications of mobility impairment will improve ?Outcome: Progressing ?  ?Problem: Pain Management: ?Goal: Pain level will decrease with appropriate interventions ?Outcome: Progressing ?  ?

## 2019-12-03 NOTE — Evaluation (Addendum)
Physical Therapy Evaluation Patient Details Name: [name] MRN: [id number] DOB: 29-Jul-1930 Today's Date: 12/03/2019   History of Present Illness  86 y.o. female with PMH significant for CHF, a fib, IBS, depression, L TKA 2019, L hip hemiarthroplasty 08/05/19 admitted for revision of L TKA.  Clinical Impression  Pt is s/p TKA resulting in the deficits listed below (see PT Problem List). Pt ambulated 66' with RW, no loss of balance. Initiated  HEP. Good progress expected.   Pt will benefit from skilled PT to increase their independence and safety with mobility to allow discharge to the venue listed below.      Follow Up Recommendations Follow surgeon's recommendation for DC plan and follow-up therapies    Equipment Recommendations  None recommended by PT    Recommendations for Other Services       Precautions / Restrictions Precautions Precautions: Knee Precaution Comments: reviewed no pillow under L knee Restrictions Weight Bearing Restrictions: No Other Position/Activity Restrictions: WBAT      Mobility  Bed Mobility Overal bed mobility: Modified Independent             General bed mobility comments: used rail  Transfers Overall transfer level: Needs assistance Equipment used: Rolling walker (2 wheeled) Transfers: Sit to/from Stand Sit to Stand: Min guard         General transfer comment: VCs hand placement  Ambulation/Gait Ambulation/Gait assistance: Min guard Gait Distance (Feet): 70 Feet Assistive device: Rolling walker (2 wheeled) Gait Pattern/deviations: Step-to pattern;Decreased stride length Gait velocity: decr   General Gait Details: VCs sequencing, no loss of balance  Stairs            Wheelchair Mobility    Modified Rankin (Stroke Patients Only)       Balance Overall balance assessment: Modified Independent                                           Pertinent Vitals/Pain Pain Assessment: 0-10 Pain Score:  4  Pain Location: L thigh Pain Descriptors / Indicators: Sore Pain Intervention(s): Limited activity within patient's tolerance;Monitored during session;Premedicated before session;Ice applied    Home Living Family/patient expects to be discharged to:: Private residence Living Arrangements: Spouse/significant other Available Help at Discharge: Family;Available 24 hours/day Type of Home: House Home Access: Level entry     Home Layout: Able to live on main level with bedroom/bathroom;Two level Home Equipment: Walker - 2 wheels;Shower seat      Prior Function Level of Independence: Independent with assistive device(s)         Comments: used RW and cane     Hand Dominance        Extremity/Trunk Assessment   Upper Extremity Assessment Upper Extremity Assessment: Overall WFL for tasks assessed    Lower Extremity Assessment Lower Extremity Assessment: LLE deficits/detail LLE Deficits / Details: knee ext -3/5, SLR +2/5 (pt reports she was unable to perform SLR prior to admission 2* hip surgery in April) LLE Sensation: WNL LLE Coordination: WNL    Cervical / Trunk Assessment Cervical / Trunk Assessment: Normal  Communication   Communication: No difficulties  Cognition Arousal/Alertness: Awake/alert Behavior During Therapy: WFL for tasks assessed/performed Overall Cognitive Status: Within Functional Limits for tasks assessed  General Comments      Exercises Total Joint Exercises Ankle Circles/Pumps: AROM;Both;10 reps;Supine Quad Sets: AROM;Both;5 reps;Supine Long Arc Quad: AROM;Left;10 reps;Seated Goniometric ROM: 5-75* AAROM L Knee   Assessment/Plan    PT Assessment Patient needs continued PT services  PT Problem List Decreased strength;Decreased range of motion;Decreased activity tolerance;Decreased mobility;Pain       PT Treatment Interventions Gait training;DME instruction;Therapeutic  activities;Therapeutic exercise;Patient/family education    PT Goals (Current goals can be found in the Care Plan section)  Acute Rehab PT Goals Patient Stated Goal: to stand and cook/clean without pain, to feel more stabile PT Goal Formulation: With patient/family Time For Goal Achievement: 12/17/19 Potential to Achieve Goals: Good    Frequency 7X/week   Barriers to discharge        Co-evaluation               AM-PAC PT "6 Clicks" Mobility  Outcome Measure Help needed turning from your back to your side while in a flat bed without using bedrails?: A Little Help needed moving from lying on your back to sitting on the side of a flat bed without using bedrails?: A Little Help needed moving to and from a bed to a chair (including a wheelchair)?: A Little Help needed standing up from a chair using your arms (e.g., wheelchair or bedside chair)?: A Little Help needed to walk in hospital room?: A Little Help needed climbing 3-5 steps with a railing? : A Little 6 Click Score: 18    End of Session Equipment Utilized During Treatment: Gait belt Activity Tolerance: Patient tolerated treatment well Patient left: in chair;with call bell/phone within reach;with family/visitor present Nurse Communication: Mobility status PT Visit Diagnosis: Difficulty in walking, not elsewhere classified (R26.2);Pain Pain - Right/Left: Left Pain - part of body: Knee    Time: 4709-6283 PT Time Calculation (min) (ACUTE ONLY): 24 min   Charges:   PT Evaluation $PT Eval Low Complexity: 1 Low PT Treatments $Gait Training: 8-22 mins        Ralene Bathe Kistler PT 12/03/2019  Acute Rehabilitation Services Pager [phone] Office [phone]

## 2019-12-03 NOTE — Anesthesia Postprocedure Evaluation (Signed)
Anesthesia Post Note  Patient: [name]  Procedure(s) Performed: LEFT TOTAL KNEE REVISION  OF POYLY EXCHANGE (Left Knee)     Patient location during evaluation: PACU Anesthesia Type: Spinal Level of consciousness: oriented and awake and alert Pain management: pain level controlled Vital Signs Assessment: post-procedure vital signs reviewed and stable Respiratory status: spontaneous breathing, respiratory function stable and patient connected to nasal cannula oxygen Cardiovascular status: blood pressure returned to baseline and stable Postop Assessment: no headache, no backache and no apparent nausea or vomiting Anesthetic complications: no   No complications documented.  Last Vitals:  Vitals:   12/03/19 1254 12/03/19 1411  BP: 121/73 111/68  Pulse: 73 70  Resp: 18 18  Temp: 36.7 C 36.4 C  SpO2: 100% 95%    Last Pain:  Vitals:   12/03/19 1411  TempSrc: Oral  PainSc:                  Shelton Silvas

## 2019-12-04 ENCOUNTER — Encounter: Payer: Self-pay | Admitting: Family Medicine

## 2019-12-04 ENCOUNTER — Encounter (HOSPITAL_COMMUNITY): Payer: Self-pay | Admitting: Orthopaedic Surgery

## 2019-12-04 DIAGNOSIS — Z79899 Other long term (current) drug therapy: Secondary | ICD-10-CM | POA: Diagnosis not present

## 2019-12-04 DIAGNOSIS — R262 Difficulty in walking, not elsewhere classified: Secondary | ICD-10-CM | POA: Diagnosis not present

## 2019-12-04 DIAGNOSIS — I5043 Acute on chronic combined systolic (congestive) and diastolic (congestive) heart failure: Secondary | ICD-10-CM | POA: Diagnosis not present

## 2019-12-04 DIAGNOSIS — I4891 Unspecified atrial fibrillation: Secondary | ICD-10-CM | POA: Diagnosis not present

## 2019-12-04 DIAGNOSIS — T84023A Instability of internal left knee prosthesis, initial encounter: Secondary | ICD-10-CM | POA: Diagnosis not present

## 2019-12-04 LAB — BASIC METABOLIC PANEL
Anion gap: 8 (ref 5–15)
BUN: 24 mg/dL — ABNORMAL HIGH (ref 8–23)
CO2: 27 mmol/L (ref 22–32)
Calcium: 9.3 mg/dL (ref 8.9–10.3)
Chloride: 100 mmol/L (ref 98–111)
Creatinine, Ser: 0.87 mg/dL (ref 0.44–1.00)
GFR calc Af Amer: >60 mL/min (ref >60)
GFR calc non Af Amer: >60 mL/min (ref >60)
Glucose, Bld: 124 mg/dL — ABNORMAL HIGH (ref 70–99)
Potassium: 5.3 mmol/L — ABNORMAL HIGH (ref 3.5–5.1)
Sodium: 135 mmol/L (ref 135–145)

## 2019-12-04 LAB — CBC
HCT: 38 % (ref 36.0–46.0)
Hemoglobin: 12.1 g/dL (ref 12.0–15.0)
MCH: 29.6 pg (ref 26.0–34.0)
MCHC: 31.8 g/dL (ref 30.0–36.0)
MCV: 92.9 fL (ref 80.0–100.0)
Platelets: 275 10*3/uL (ref 150–400)
RBC: 4.09 MIL/uL (ref 3.87–5.11)
RDW: 13.8 % (ref 11.5–15.5)
WBC: 13.4 10*3/uL — ABNORMAL HIGH (ref 4.0–10.5)
nRBC: 0 % (ref 0.0–0.2)

## 2019-12-04 NOTE — Discharge Summary (Signed)
Patient ID: [name] MRN: [id number] DOB/AGE: [birth year] 84 y.o.  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 Acute combined systolic and diastolic heart failure (HCC) 05/02/2017  . Anemia   . Anxiety   . Arthritis    "knees" (06/18/2015)  . Atrial fibrillation with RVR (HCC) 06/18/2015   h/o sig bleed on coumadin  . Cardiogenic shock (HCC) 05/02/2017  . CHF (congestive heart failure) (HCC)   . Complication of anesthesia 2010   "w/cardiac cath; got into a psychotic state for ~ 3 days; got me out w/Valium"  . Depression    "I have it off and on; not as often as I've gotten older" (06/18/2015)  . Diverticulosis    descending and sigmoid colon--Dr. Jarold Motto  . DJD (degenerative joint disease) of knee    left  . Dyspnea   . Dysrhythmia   . E coli infection    History of  . GERD (gastroesophageal reflux disease)   . History of Clostridium difficile colitis   . IBS (irritable bowel syndrome)   . Insomnia     off chronic ambien 5mg  as of 10/11, rare/episodic use since then  DCM/CHF  . LBBB (left bundle branch block)   . Lymphocytic colitis 09/27/2016  . Moderate to severe pulmonary hypertension (HCC) 11/26/2019  . Nonischemic cardiomyopathy (HCC)    normal coronaries, EF 15-20% 04/2008, EF 50-55% 2015  . Osteoporosis    on DXA 05/2010  . Positive PPD   . Retroperitoneal bleed   . Sciatica    Right    Surgeries: Procedure(s): LEFT TOTAL KNEE REVISION  OF POYLY EXCHANGE on 12/03/2019   Consultants:   Discharged Condition: Improved  Hospital Course: [name] is an 86 y.o. female who was admitted 12/03/2019 for operative treatment of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. Patient has severe unremitting pain that affects sleep,  daily activities, and work/hobbies. After pre-op clearance the patient was taken to the operating room on 12/03/2019 and underwent  Procedure(s): LEFT TOTAL KNEE REVISION  OF POYLY EXCHANGE.    Patient was given perioperative antibiotics:  Anti-infectives (From admission, onward)   Start     Dose/Rate Route Frequency Ordered Stop   12/03/19 0615  ceFAZolin (ANCEF) IVPB 2g/100 mL premix        2 g 200 mL/hr over 30 Minutes Intravenous On call to O.R. 12/03/19 9179 12/03/19 1505       Patient was given sequential compression devices, early ambulation, and chemoprophylaxis to prevent DVT.  Patient benefited maximally from hospital stay and there were no complications.    Recent vital signs:  Patient Vitals for the past 24 hrs:  BP Temp Temp src Pulse Resp SpO2 Height Weight  12/04/19 0628 133/85 97.8 F (36.6 C) Oral 88 16 100 % -- --  12/04/19 0324 (!) 144/87 98.7 F (37.1 C) -- 74 16 97 % -- --  12/03/19 2115 (!) 128/52 97.7 F (36.5 C) Oral 78 16 96 % -- --  12/03/19 1739 112/87 98.1 F (36.7 C) Oral 83 14 96 % -- --  12/03/19 1411 111/68 97.6 F (36.4 C) Oral 70 18 95 % -- --  12/03/19 1254 121/73 98 F (36.7 C) -- 73 18 100 % -- --  12/03/19 1201  120/70 98 F (36.7 C) -- 75 18 100 % -- --  12/03/19 1106 (!) 144/75 -- -- 64 18 100 % -- --  12/03/19 1010 (!) 142/69 97.8 F (36.6 C) Oral 65 18 100 % 5\' 5"  (1.651 m) 69.4 kg  12/03/19 0945 138/85 97.7 F (36.5 C) -- 81 18 100 % -- --  12/03/19 0930 137/75 -- -- 61 16 100 % -- --  12/03/19 0915 133/75 -- -- 77 (!) 21 100 % -- --  12/03/19 0908 122/73 98.1 F (36.7 C) -- 80 (!) 21 100 % -- --     Recent laboratory studies:  Recent Labs    12/02/19 0930 12/02/19 0930 12/04/19 0239  WBC 13.8*  --  13.4*  HGB 14.2  --  12.1  HCT 45.0  --  38.0  PLT 323  --  275  NA 135  --  135  K 3.7  --  5.3*  CL 98  --  100  CO2 29  --  27  BUN 17  --  24*  CREATININE 0.68  --  0.87  GLUCOSE 94  --  124*  INR 1.0  --   --    CALCIUM 9.1   < > 9.3   < > = values in this interval not displayed.     Discharge Medications:   Allergies as of 12/04/2019      Reactions   Ace Inhibitors Cough   Warfarin Sodium Other (See Comments)   DOSE RELATED PHARMACOLOGIC EFFECT "bleed out"   Famotidine Other (See Comments)   GI upset   Codeine Other (See Comments)   sedation   Delsym [dextromethorphan Polistirex Er] Other (See Comments)   dizziness   Lactose Intolerance (gi) Diarrhea   Phenylephrine Palpitations, Other (See Comments)   Nasal spray- "likely increase in nasal congestion".    Sulfamethoxazole-trimethoprim Nausea And Vomiting   GI intolerance.      Medication List    STOP taking these medications   traMADol 50 MG tablet Commonly known as: ULTRAM     TAKE these medications   AZO-CRANBERRY PO Take 1 tablet by mouth daily.   BIOTIN PO Take 1 tablet by mouth daily.   calcium carbonate 500 MG chewable tablet Commonly known as: TUMS - dosed in mg elemental calcium Chew 1 tablet by mouth 3 (three) times daily as needed for indigestion or heartburn.   [employer] LUBRICATING/DRY EYE OP Place 1-2 drops into both eyes daily as needed (for dry eyes).   digoxin 0.125 MG tablet Commonly known as: LANOXIN Take 1 tablet (0.125 mg total) by mouth daily.   Eliquis 2.5 MG Tabs tablet Generic drug: apixaban TAKE 1 TABLET BY MOUTH TWICE A DAY   loperamide 2 MG capsule Commonly known as: IMODIUM Take 2 mg by mouth 3 (three) times daily as needed for diarrhea or loose stools.   metoprolol succinate 50 MG 24 hr tablet Commonly known as: TOPROL-XL Take 1.5 tablets (75 mg total) by mouth daily. Take with or immediately following a meal.   mirabegron ER 25 MG Tb24 tablet Commonly known as: Myrbetriq Take 1 tablet (25 mg total) by mouth daily.   OVER THE COUNTER MEDICATION Apply 1 application topically See admin instructions. Hemp Cream - apply topically to knees daily   potassium chloride SA 20 MEQ  tablet Commonly known as: KLOR-CON Take 20 mEq by mouth daily.   predniSONE 10 MG tablet Commonly known as: DELTASONE Take 2 tablets (20 mg total) by  mouth daily with breakfast. What changed: how much to take   PROBIOTIC-10 PO Take 1 tablet by mouth daily.   sertraline 50 MG tablet Commonly known as: ZOLOFT Take 3 tablets (150 mg total) by mouth daily. What changed: how much to take   tizanidine 2 MG capsule Commonly known as: ZANAFLEX Take 2 mg by mouth 2 (two) times daily as needed for muscle spasms.   trolamine salicylate 10 % cream Commonly known as: ASPERCREME Apply 1 application topically 2 (two) times daily as needed (knee pain).   TYLENOL 500 MG tablet Generic drug: acetaminophen Take 1,000 mg by mouth 2 (two) times daily as needed for moderate pain or headache.   cholecalciferol 25 MCG (1000 UNIT) tablet Commonly known as: VITAMIN D3 Take 1,000 Units by mouth daily.   Vitamin D 50 MCG (2000 UT) Caps Take 1 capsule (2,000 Units total) by mouth daily.       Diagnostic Studies: No results found.  Disposition: Discharge disposition: 01-Home or Self Care       Discharge Instructions    Call MD / Call 911   Complete by: As directed    If you experience chest pain or shortness of breath, CALL 911 and be transported to the hospital emergency room.  If you develope a fever above 101 F, pus (white drainage) or increased drainage or redness at the wound, or calf pain, call your surgeon's office.   Constipation Prevention   Complete by: As directed    Drink plenty of fluids.  Prune juice may be helpful.  You may use a stool softener, such as Colace (over the counter) 100 mg twice a day.  Use MiraLax (over the counter) for constipation as needed.   Diet - low sodium heart healthy   Complete by: As directed    Discharge instructions   Complete by: As directed    INSTRUCTIONS AFTER JOINT REPLACEMENT   Remove items at home which could result in a fall. This  includes throw rugs or furniture in walking pathways ICE to the affected joint every three hours while awake for 30 minutes at a time, for at least the first 3-5 days, and then as needed for pain and swelling.  Continue to use ice for pain and swelling. You may notice swelling that will progress down to the foot and ankle.  This is normal after surgery.  Elevate your leg when you are not up walking on it.   Continue to use the breathing machine you got in the hospital (incentive spirometer) which will help keep your temperature down.  It is common for your temperature to cycle up and down following surgery, especially at night when you are not up moving around and exerting yourself.  The breathing machine keeps your lungs expanded and your temperature down.   DIET:  As you were doing prior to hospitalization, we recommend a well-balanced diet.  DRESSING / WOUND CARE / SHOWERING  You may shower 3 days after surgery, but keep the wounds dry during showering.  You may use an occlusive plastic wrap (Press'n Seal for example), NO SOAKING/SUBMERGING IN THE BATHTUB.  If the bandage gets wet, change with a clean dry gauze.  If the incision gets wet, pat the wound dry with a clean towel.  ACTIVITY  Increase activity slowly as tolerated, but follow the weight bearing instructions below.   No driving for 6 weeks or until further direction given by your physician.  You cannot drive while taking narcotics.  No lifting or carrying greater than 10 lbs. until further directed by your surgeon. Avoid periods of inactivity such as sitting longer than an hour when not asleep. This helps prevent blood clots.  You may return to work once you are authorized by your doctor.     WEIGHT BEARING   Weight bearing as tolerated with assist device (walker, cane, etc) as directed, use it as long as suggested by your surgeon or therapist, typically at least 4-6 weeks.   EXERCISES  Results after joint replacement surgery  are often greatly improved when you follow the exercise, range of motion and muscle strengthening exercises prescribed by your doctor. Safety measures are also important to protect the joint from further injury. Any time any of these exercises cause you to have increased pain or swelling, decrease what you are doing until you are comfortable again and then slowly increase them. If you have problems or questions, call your caregiver or physical therapist for advice.   Rehabilitation is important following a joint replacement. After just a few days of immobilization, the muscles of the leg can become weakened and shrink (atrophy).  These exercises are designed to build up the tone and strength of the thigh and leg muscles and to improve motion. Often times heat used for twenty to thirty minutes before working out will loosen up your tissues and help with improving the range of motion but do not use heat for the first two weeks following surgery (sometimes heat can increase post-operative swelling).   These exercises can be done on a training (exercise) mat, on the floor, on a table or on a bed. Use whatever works the best and is most comfortable for you.    Use music or television while you are exercising so that the exercises are a pleasant break in your day. This will make your life better with the exercises acting as a break in your routine that you can look forward to.   Perform all exercises about fifteen times, three times per day or as directed.  You should exercise both the operative leg and the other leg as well.  Exercises include:   Quad Sets - Tighten up the muscle on the front of the thigh (Quad) and hold for 5-10 seconds.   Straight Leg Raises - With your knee straight (if you were given a brace, keep it on), lift the leg to 60 degrees, hold for 3 seconds, and slowly lower the leg.  Perform this exercise against resistance later as your leg gets stronger.  Leg Slides: Lying on your back, slowly  slide your foot toward your buttocks, bending your knee up off the floor (only go as far as is comfortable). Then slowly slide your foot back down until your leg is flat on the floor again.  Angel Wings: Lying on your back spread your legs to the side as far apart as you can without causing discomfort.  Hamstring Strength:  Lying on your back, push your heel against the floor with your leg straight by tightening up the muscles of your buttocks.  Repeat, but this time bend your knee to a comfortable angle, and push your heel against the floor.  You may put a pillow under the heel to make it more comfortable if necessary.   A rehabilitation program following joint replacement surgery can speed recovery and prevent re-injury in the future due to weakened muscles. Contact your doctor or a physical therapist for more information on knee rehabilitation.  CONSTIPATION  Constipation is defined medically as fewer than three stools per week and severe constipation as less than one stool per week.  Even if you have a regular bowel pattern at home, your normal regimen is likely to be disrupted due to multiple reasons following surgery.  Combination of anesthesia, postoperative narcotics, change in appetite and fluid intake all can affect your bowels.   YOU MUST use at least one of the following options; they are listed in order of increasing strength to get the job done.  They are all available over the counter, and you may need to use some, POSSIBLY even all of these options:    Drink plenty of fluids (prune juice may be helpful) and high fiber foods Colace 100 mg by mouth twice a day  Senokot for constipation as directed and as needed Dulcolax (bisacodyl), take with full glass of water  Miralax (polyethylene glycol) once or twice a day as needed.  If you have tried all these things and are unable to have a bowel movement in the first 3-4 days after surgery call either your surgeon or your primary doctor.     If you experience loose stools or diarrhea, hold the medications until you stool forms back up.  If your symptoms do not get better within 1 week or if they get worse, check with your doctor.  If you experience "the worst abdominal pain ever" or develop nausea or vomiting, please contact the office immediately for further recommendations for treatment.   ITCHING:  If you experience itching with your medications, try taking only a single pain pill, or even half a pain pill at a time.  You can also use Benadryl over the counter for itching or also to help with sleep.   TED HOSE STOCKINGS:  Use stockings on both legs until for at least 2 weeks or as directed by physician office. They may be removed at night for sleeping.  MEDICATIONS:  See your medication summary on the "After Visit Summary" that nursing will review with you.  You may have some home medications which will be placed on hold until you complete the course of blood thinner medication.  It is important for you to complete the blood thinner medication as prescribed.  PRECAUTIONS:  If you experience chest pain or shortness of breath - call 911 immediately for transfer to the hospital emergency department.   If you develop a fever greater that 101 F, purulent drainage from wound, increased redness or drainage from wound, foul odor from the wound/dressing, or calf pain - CONTACT YOUR SURGEON.                                                   FOLLOW-UP APPOINTMENTS:  If you do not already have a post-op appointment, please call the office for an appointment to be seen by your surgeon.  Guidelines for how soon to be seen are listed in your "After Visit Summary", but are typically between 1-4 weeks after surgery.  OTHER INSTRUCTIONS:   Knee Replacement:  Do not place pillow under knee, focus on keeping the knee straight while resting. CPM instructions: 0-90 degrees, 2 hours in the morning, 2 hours in the afternoon, and 2 hours in the evening.  Place foam block, curve side up under heel at all times except when in CPM or  when walking.  DO NOT modify, tear, cut, or change the foam block in any way.   DENTAL ANTIBIOTICS:  In most cases prophylactic antibiotics for Dental procdeures after total joint surgery are not necessary.  Exceptions are as follows:  1. History of prior total joint infection  2. Severely immunocompromised (Organ Transplant, cancer chemotherapy, Rheumatoid biologic meds such as Humera)  3. Poorly controlled diabetes (A1C &gt; 8.0, blood glucose over 200)  If you have one of these conditions, contact your surgeon for an antibiotic prescription, prior to your dental procedure.   MAKE SURE YOU:  Understand these instructions.  Get help right away if you are not doing well or get worse.    Thank you for letting us be a part of your medical care team.  It is a privilege we respect greatly.  We hope these instructions will help you stay on track for a fast and full recovery!   Increase activity slowly as tolerated   Complete by: As directed        Follow-up Information    [name], MD. Schedule an appointment as soon as possible for a visit in 2 weeks.   Specialty: Orthopedic Surgery Contact information: [address] [place] [place] [address] [phone]                Signed: Ginger Organ Presley Summerlin 12/04/2019, 8:12 AM

## 2019-12-04 NOTE — Addendum Note (Signed)
Addended by: Evans Lance on: 12/04/2019 02:25 PM   Modules accepted: Orders

## 2019-12-04 NOTE — Progress Notes (Signed)
Physical Therapy Treatment Patient Details Name: [name] MRN: [id number] DOB: [birth year] Today's Date: 12/04/2019    History of Present Illness 86 y.o. female with PMH significant for CHF, a fib, IBS, depression, L TKA 2019, L hip hemiarthroplasty 08/05/19 admitted for revision of L TKA.    PT Comments    Pt is progressing well with mobility and is ready to DC home from PT standpoint. Pt ambulated 160' with RW and demonstrates good understanding of HEP.   Follow Up Recommendations  Follow surgeon's recommendation for DC plan and follow-up therapies     Equipment Recommendations  None recommended by PT    Recommendations for Other Services       Precautions / Restrictions Precautions Precautions: Knee Precaution Comments: reviewed no pillow under L knee Restrictions Weight Bearing Restrictions: No Other Position/Activity Restrictions: WBAT    Mobility  Bed Mobility               General bed mobility comments: up in recliner  Transfers Overall transfer level: Modified independent Equipment used: Rolling walker (2 wheeled) Transfers: Sit to/from Stand Sit to Stand: Modified independent (Device/Increase time)            Ambulation/Gait Ambulation/Gait assistance: Supervision Gait Distance (Feet): 160 Feet Assistive device: Rolling walker (2 wheeled) Gait Pattern/deviations: Step-to pattern;Decreased stride length Gait velocity: decr   General Gait Details: VCs sequencing, no loss of balance   Stairs             Wheelchair Mobility    Modified Rankin (Stroke Patients Only)       Balance Overall balance assessment: Modified Independent                                          Cognition Arousal/Alertness: Awake/alert Behavior During Therapy: WFL for tasks assessed/performed Overall Cognitive Status: Within Functional Limits for tasks assessed                                        Exercises  Total Joint Exercises Ankle Circles/Pumps: AROM;Both;10 reps;Supine Quad Sets: AROM;Both;5 reps;Supine Short Arc Quad: AROM;Left;10 reps;Supine Heel Slides: AAROM;Left;10 reps;Supine Hip ABduction/ADduction: AROM;Left;10 reps;Supine Straight Leg Raises: AROM;Left;AAROM;5 reps Long Arc Quad: AROM;Left;10 reps;Seated Knee Flexion: AROM;Left;10 reps;Seated Goniometric ROM: 5-90* L knee AAROM    General Comments        Pertinent Vitals/Pain Pain Score: 5  Pain Location: L thigh Pain Descriptors / Indicators: Sore Pain Intervention(s): Limited activity within patient's tolerance;Monitored during session;Premedicated before session;Ice applied    Home Living                      Prior Function            PT Goals (current goals can now be found in the care plan section) Acute Rehab PT Goals Patient Stated Goal: to stand and cook/clean without pain, to feel more stabile PT Goal Formulation: With patient/family Time For Goal Achievement: 12/17/19 Potential to Achieve Goals: Good Progress towards PT goals: Goals met/education completed, patient discharged from PT    Frequency    7X/week      PT Plan Current plan remains appropriate    Co-evaluation              AM-PAC PT "6 Clicks" Mobility  Outcome Measure  Help needed turning from your back to your side while in a flat bed without using bedrails?: None Help needed moving from lying on your back to sitting on the side of a flat bed without using bedrails?: None Help needed moving to and from a bed to a chair (including a wheelchair)?: None Help needed standing up from a chair using your arms (e.g., wheelchair or bedside chair)?: None Help needed to walk in hospital room?: None Help needed climbing 3-5 steps with a railing? : A Little 6 Click Score: 23    End of Session Equipment Utilized During Treatment: Gait belt Activity Tolerance: Patient tolerated treatment well Patient left: in chair;with  call bell/phone within reach;with family/visitor present Nurse Communication: Mobility status PT Visit Diagnosis: Difficulty in walking, not elsewhere classified (R26.2);Pain Pain - Right/Left: Left Pain - part of body: Knee     Time: 9558-3167 PT Time Calculation (min) (ACUTE ONLY): 28 min  Charges:  $Gait Training: 8-22 mins $Therapeutic Exercise: 8-22 mins                     Blondell Reveal Kistler PT 12/04/2019  Acute Rehabilitation Services Pager [phone] Office [phone]

## 2019-12-04 NOTE — Progress Notes (Signed)
Subjective: 1 Day Post-Op Procedure(s) (LRB): LEFT TOTAL KNEE REVISION  OF POYLY EXCHANGE (Left)   Patient is doing great and has no pain. She feels more stable in bed and walking around then before surgery.  Activity level:  wbat Diet tolerance:  ok Voiding:  ok Patient reports pain as 0 on 0-10 scale.    Objective: Vital signs in last 24 hours: Temp:  [97.6 F (36.4 C)-98.7 F (37.1 C)] 97.8 F (36.6 C) (08/25 0628) Pulse Rate:  [61-88] 88 (08/25 0628) Resp:  [14-21] 16 (08/25 0628) BP: (111-144)/(52-87) 133/85 (08/25 0628) SpO2:  [95 %-100 %] 100 % (08/25 0628) Weight:  [69.4 kg] 69.4 kg (08/24 1010)  Labs: Recent Labs    12/02/19 0930 12/04/19 0239  HGB 14.2 12.1   Recent Labs    12/02/19 0930 12/04/19 0239  WBC 13.8* 13.4*  RBC 4.78 4.09  HCT 45.0 38.0  PLT 323 275   Recent Labs    12/02/19 0930 12/04/19 0239  NA 135 135  K 3.7 5.3*  CL 98 100  CO2 29 27  BUN 17 24*  CREATININE 0.68 0.87  GLUCOSE 94 124*  CALCIUM 9.1 9.3   Recent Labs    12/02/19 0930  INR 1.0    Physical Exam:  Neurologically intact ABD soft Neurovascular intact Sensation intact distally Intact pulses distally Dorsiflexion/Plantar flexion intact Incision: no drainage No cellulitis present Compartment soft  Assessment/Plan:  1 Day Post-Op Procedure(s) (LRB): LEFT TOTAL KNEE REVISION  OF POYLY EXCHANGE (Left) Advance diet Up with therapy D/C IV fluids Discharge home with home health today after PT if cleared.  Follow up in office 7-10 days post op. Continue on baseline eliquis.    [name] 12/04/2019, 8:08 AM

## 2019-12-04 NOTE — Plan of Care (Signed)
Pt ready for DC home with husband after completion of PT

## 2019-12-05 DIAGNOSIS — Z96642 Presence of left artificial hip joint: Secondary | ICD-10-CM | POA: Diagnosis not present

## 2019-12-05 DIAGNOSIS — Z471 Aftercare following joint replacement surgery: Secondary | ICD-10-CM | POA: Diagnosis not present

## 2019-12-05 DIAGNOSIS — I5041 Acute combined systolic (congestive) and diastolic (congestive) heart failure: Secondary | ICD-10-CM | POA: Diagnosis not present

## 2019-12-05 DIAGNOSIS — Z7952 Long term (current) use of systemic steroids: Secondary | ICD-10-CM | POA: Diagnosis not present

## 2019-12-05 DIAGNOSIS — I110 Hypertensive heart disease with heart failure: Secondary | ICD-10-CM | POA: Diagnosis not present

## 2019-12-05 DIAGNOSIS — Z96652 Presence of left artificial knee joint: Secondary | ICD-10-CM | POA: Diagnosis not present

## 2019-12-05 DIAGNOSIS — I447 Left bundle-branch block, unspecified: Secondary | ICD-10-CM | POA: Diagnosis not present

## 2019-12-05 DIAGNOSIS — Z7982 Long term (current) use of aspirin: Secondary | ICD-10-CM | POA: Diagnosis not present

## 2019-12-05 DIAGNOSIS — K589 Irritable bowel syndrome without diarrhea: Secondary | ICD-10-CM | POA: Diagnosis not present

## 2019-12-05 DIAGNOSIS — K5790 Diverticulosis of intestine, part unspecified, without perforation or abscess without bleeding: Secondary | ICD-10-CM | POA: Diagnosis not present

## 2019-12-05 DIAGNOSIS — K219 Gastro-esophageal reflux disease without esophagitis: Secondary | ICD-10-CM | POA: Diagnosis not present

## 2019-12-05 DIAGNOSIS — I428 Other cardiomyopathies: Secondary | ICD-10-CM | POA: Diagnosis not present

## 2019-12-05 DIAGNOSIS — D649 Anemia, unspecified: Secondary | ICD-10-CM | POA: Diagnosis not present

## 2019-12-05 DIAGNOSIS — I272 Pulmonary hypertension, unspecified: Secondary | ICD-10-CM | POA: Diagnosis not present

## 2019-12-05 DIAGNOSIS — M810 Age-related osteoporosis without current pathological fracture: Secondary | ICD-10-CM | POA: Diagnosis not present

## 2019-12-05 DIAGNOSIS — I4891 Unspecified atrial fibrillation: Secondary | ICD-10-CM | POA: Diagnosis not present

## 2019-12-07 DIAGNOSIS — I4891 Unspecified atrial fibrillation: Secondary | ICD-10-CM | POA: Diagnosis not present

## 2019-12-07 DIAGNOSIS — Z7982 Long term (current) use of aspirin: Secondary | ICD-10-CM | POA: Diagnosis not present

## 2019-12-07 DIAGNOSIS — K219 Gastro-esophageal reflux disease without esophagitis: Secondary | ICD-10-CM | POA: Diagnosis not present

## 2019-12-07 DIAGNOSIS — Z471 Aftercare following joint replacement surgery: Secondary | ICD-10-CM | POA: Diagnosis not present

## 2019-12-07 DIAGNOSIS — M810 Age-related osteoporosis without current pathological fracture: Secondary | ICD-10-CM | POA: Diagnosis not present

## 2019-12-07 DIAGNOSIS — Z96652 Presence of left artificial knee joint: Secondary | ICD-10-CM | POA: Diagnosis not present

## 2019-12-07 DIAGNOSIS — I110 Hypertensive heart disease with heart failure: Secondary | ICD-10-CM | POA: Diagnosis not present

## 2019-12-07 DIAGNOSIS — D649 Anemia, unspecified: Secondary | ICD-10-CM | POA: Diagnosis not present

## 2019-12-07 DIAGNOSIS — K589 Irritable bowel syndrome without diarrhea: Secondary | ICD-10-CM | POA: Diagnosis not present

## 2019-12-07 DIAGNOSIS — K5790 Diverticulosis of intestine, part unspecified, without perforation or abscess without bleeding: Secondary | ICD-10-CM | POA: Diagnosis not present

## 2019-12-07 DIAGNOSIS — I5041 Acute combined systolic (congestive) and diastolic (congestive) heart failure: Secondary | ICD-10-CM | POA: Diagnosis not present

## 2019-12-07 DIAGNOSIS — I428 Other cardiomyopathies: Secondary | ICD-10-CM | POA: Diagnosis not present

## 2019-12-07 DIAGNOSIS — I447 Left bundle-branch block, unspecified: Secondary | ICD-10-CM | POA: Diagnosis not present

## 2019-12-07 DIAGNOSIS — I272 Pulmonary hypertension, unspecified: Secondary | ICD-10-CM | POA: Diagnosis not present

## 2019-12-07 DIAGNOSIS — Z7952 Long term (current) use of systemic steroids: Secondary | ICD-10-CM | POA: Diagnosis not present

## 2019-12-07 DIAGNOSIS — Z96642 Presence of left artificial hip joint: Secondary | ICD-10-CM | POA: Diagnosis not present

## 2019-12-09 DIAGNOSIS — I447 Left bundle-branch block, unspecified: Secondary | ICD-10-CM | POA: Diagnosis not present

## 2019-12-09 DIAGNOSIS — Z7952 Long term (current) use of systemic steroids: Secondary | ICD-10-CM | POA: Diagnosis not present

## 2019-12-09 DIAGNOSIS — M810 Age-related osteoporosis without current pathological fracture: Secondary | ICD-10-CM | POA: Diagnosis not present

## 2019-12-09 DIAGNOSIS — I110 Hypertensive heart disease with heart failure: Secondary | ICD-10-CM | POA: Diagnosis not present

## 2019-12-09 DIAGNOSIS — I428 Other cardiomyopathies: Secondary | ICD-10-CM | POA: Diagnosis not present

## 2019-12-09 DIAGNOSIS — D649 Anemia, unspecified: Secondary | ICD-10-CM | POA: Diagnosis not present

## 2019-12-09 DIAGNOSIS — I4891 Unspecified atrial fibrillation: Secondary | ICD-10-CM | POA: Diagnosis not present

## 2019-12-09 DIAGNOSIS — Z7982 Long term (current) use of aspirin: Secondary | ICD-10-CM | POA: Diagnosis not present

## 2019-12-09 DIAGNOSIS — I272 Pulmonary hypertension, unspecified: Secondary | ICD-10-CM | POA: Diagnosis not present

## 2019-12-09 DIAGNOSIS — K219 Gastro-esophageal reflux disease without esophagitis: Secondary | ICD-10-CM | POA: Diagnosis not present

## 2019-12-09 DIAGNOSIS — I5041 Acute combined systolic (congestive) and diastolic (congestive) heart failure: Secondary | ICD-10-CM | POA: Diagnosis not present

## 2019-12-09 DIAGNOSIS — K589 Irritable bowel syndrome without diarrhea: Secondary | ICD-10-CM | POA: Diagnosis not present

## 2019-12-09 DIAGNOSIS — Z96652 Presence of left artificial knee joint: Secondary | ICD-10-CM | POA: Diagnosis not present

## 2019-12-09 DIAGNOSIS — K5790 Diverticulosis of intestine, part unspecified, without perforation or abscess without bleeding: Secondary | ICD-10-CM | POA: Diagnosis not present

## 2019-12-09 DIAGNOSIS — Z471 Aftercare following joint replacement surgery: Secondary | ICD-10-CM | POA: Diagnosis not present

## 2019-12-09 DIAGNOSIS — Z96642 Presence of left artificial hip joint: Secondary | ICD-10-CM | POA: Diagnosis not present

## 2019-12-10 DIAGNOSIS — I447 Left bundle-branch block, unspecified: Secondary | ICD-10-CM | POA: Diagnosis not present

## 2019-12-10 DIAGNOSIS — I5041 Acute combined systolic (congestive) and diastolic (congestive) heart failure: Secondary | ICD-10-CM | POA: Diagnosis not present

## 2019-12-10 DIAGNOSIS — Z7982 Long term (current) use of aspirin: Secondary | ICD-10-CM | POA: Diagnosis not present

## 2019-12-10 DIAGNOSIS — I272 Pulmonary hypertension, unspecified: Secondary | ICD-10-CM | POA: Diagnosis not present

## 2019-12-10 DIAGNOSIS — K219 Gastro-esophageal reflux disease without esophagitis: Secondary | ICD-10-CM | POA: Diagnosis not present

## 2019-12-10 DIAGNOSIS — I110 Hypertensive heart disease with heart failure: Secondary | ICD-10-CM | POA: Diagnosis not present

## 2019-12-10 DIAGNOSIS — I4891 Unspecified atrial fibrillation: Secondary | ICD-10-CM | POA: Diagnosis not present

## 2019-12-10 DIAGNOSIS — Z96642 Presence of left artificial hip joint: Secondary | ICD-10-CM | POA: Diagnosis not present

## 2019-12-10 DIAGNOSIS — D649 Anemia, unspecified: Secondary | ICD-10-CM | POA: Diagnosis not present

## 2019-12-10 DIAGNOSIS — K589 Irritable bowel syndrome without diarrhea: Secondary | ICD-10-CM | POA: Diagnosis not present

## 2019-12-10 DIAGNOSIS — I428 Other cardiomyopathies: Secondary | ICD-10-CM | POA: Diagnosis not present

## 2019-12-10 DIAGNOSIS — Z471 Aftercare following joint replacement surgery: Secondary | ICD-10-CM | POA: Diagnosis not present

## 2019-12-10 DIAGNOSIS — Z7952 Long term (current) use of systemic steroids: Secondary | ICD-10-CM | POA: Diagnosis not present

## 2019-12-10 DIAGNOSIS — M810 Age-related osteoporosis without current pathological fracture: Secondary | ICD-10-CM | POA: Diagnosis not present

## 2019-12-10 DIAGNOSIS — K5790 Diverticulosis of intestine, part unspecified, without perforation or abscess without bleeding: Secondary | ICD-10-CM | POA: Diagnosis not present

## 2019-12-10 DIAGNOSIS — Z96652 Presence of left artificial knee joint: Secondary | ICD-10-CM | POA: Diagnosis not present

## 2019-12-12 DIAGNOSIS — Z96642 Presence of left artificial hip joint: Secondary | ICD-10-CM | POA: Diagnosis not present

## 2019-12-12 DIAGNOSIS — Z7982 Long term (current) use of aspirin: Secondary | ICD-10-CM | POA: Diagnosis not present

## 2019-12-12 DIAGNOSIS — Z7952 Long term (current) use of systemic steroids: Secondary | ICD-10-CM | POA: Diagnosis not present

## 2019-12-12 DIAGNOSIS — K5790 Diverticulosis of intestine, part unspecified, without perforation or abscess without bleeding: Secondary | ICD-10-CM | POA: Diagnosis not present

## 2019-12-12 DIAGNOSIS — D649 Anemia, unspecified: Secondary | ICD-10-CM | POA: Diagnosis not present

## 2019-12-12 DIAGNOSIS — K219 Gastro-esophageal reflux disease without esophagitis: Secondary | ICD-10-CM | POA: Diagnosis not present

## 2019-12-12 DIAGNOSIS — I428 Other cardiomyopathies: Secondary | ICD-10-CM | POA: Diagnosis not present

## 2019-12-12 DIAGNOSIS — I272 Pulmonary hypertension, unspecified: Secondary | ICD-10-CM | POA: Diagnosis not present

## 2019-12-12 DIAGNOSIS — I110 Hypertensive heart disease with heart failure: Secondary | ICD-10-CM | POA: Diagnosis not present

## 2019-12-12 DIAGNOSIS — I4891 Unspecified atrial fibrillation: Secondary | ICD-10-CM | POA: Diagnosis not present

## 2019-12-12 DIAGNOSIS — I447 Left bundle-branch block, unspecified: Secondary | ICD-10-CM | POA: Diagnosis not present

## 2019-12-12 DIAGNOSIS — I5041 Acute combined systolic (congestive) and diastolic (congestive) heart failure: Secondary | ICD-10-CM | POA: Diagnosis not present

## 2019-12-12 DIAGNOSIS — Z96652 Presence of left artificial knee joint: Secondary | ICD-10-CM | POA: Diagnosis not present

## 2019-12-12 DIAGNOSIS — M810 Age-related osteoporosis without current pathological fracture: Secondary | ICD-10-CM | POA: Diagnosis not present

## 2019-12-12 DIAGNOSIS — K589 Irritable bowel syndrome without diarrhea: Secondary | ICD-10-CM | POA: Diagnosis not present

## 2019-12-12 DIAGNOSIS — Z471 Aftercare following joint replacement surgery: Secondary | ICD-10-CM | POA: Diagnosis not present

## 2019-12-13 DIAGNOSIS — Z9889 Other specified postprocedural states: Secondary | ICD-10-CM | POA: Diagnosis not present

## 2019-12-17 ENCOUNTER — Encounter: Payer: Self-pay | Admitting: Family Medicine

## 2019-12-17 DIAGNOSIS — D649 Anemia, unspecified: Secondary | ICD-10-CM | POA: Diagnosis not present

## 2019-12-17 DIAGNOSIS — Z7982 Long term (current) use of aspirin: Secondary | ICD-10-CM | POA: Diagnosis not present

## 2019-12-17 DIAGNOSIS — Z96642 Presence of left artificial hip joint: Secondary | ICD-10-CM | POA: Diagnosis not present

## 2019-12-17 DIAGNOSIS — K219 Gastro-esophageal reflux disease without esophagitis: Secondary | ICD-10-CM | POA: Diagnosis not present

## 2019-12-17 DIAGNOSIS — Z96652 Presence of left artificial knee joint: Secondary | ICD-10-CM | POA: Diagnosis not present

## 2019-12-17 DIAGNOSIS — Z471 Aftercare following joint replacement surgery: Secondary | ICD-10-CM | POA: Diagnosis not present

## 2019-12-17 DIAGNOSIS — K5790 Diverticulosis of intestine, part unspecified, without perforation or abscess without bleeding: Secondary | ICD-10-CM | POA: Diagnosis not present

## 2019-12-17 DIAGNOSIS — I4891 Unspecified atrial fibrillation: Secondary | ICD-10-CM | POA: Diagnosis not present

## 2019-12-17 DIAGNOSIS — I272 Pulmonary hypertension, unspecified: Secondary | ICD-10-CM | POA: Diagnosis not present

## 2019-12-17 DIAGNOSIS — K589 Irritable bowel syndrome without diarrhea: Secondary | ICD-10-CM | POA: Diagnosis not present

## 2019-12-17 DIAGNOSIS — Z7952 Long term (current) use of systemic steroids: Secondary | ICD-10-CM | POA: Diagnosis not present

## 2019-12-17 DIAGNOSIS — I428 Other cardiomyopathies: Secondary | ICD-10-CM | POA: Diagnosis not present

## 2019-12-17 DIAGNOSIS — M810 Age-related osteoporosis without current pathological fracture: Secondary | ICD-10-CM | POA: Diagnosis not present

## 2019-12-17 DIAGNOSIS — I110 Hypertensive heart disease with heart failure: Secondary | ICD-10-CM | POA: Diagnosis not present

## 2019-12-17 DIAGNOSIS — I447 Left bundle-branch block, unspecified: Secondary | ICD-10-CM | POA: Diagnosis not present

## 2019-12-17 DIAGNOSIS — I5041 Acute combined systolic (congestive) and diastolic (congestive) heart failure: Secondary | ICD-10-CM | POA: Diagnosis not present

## 2019-12-18 ENCOUNTER — Other Ambulatory Visit: Payer: Self-pay | Admitting: Family Medicine

## 2019-12-18 ENCOUNTER — Other Ambulatory Visit: Payer: Self-pay | Admitting: Internal Medicine

## 2019-12-19 DIAGNOSIS — I5041 Acute combined systolic (congestive) and diastolic (congestive) heart failure: Secondary | ICD-10-CM | POA: Diagnosis not present

## 2019-12-19 DIAGNOSIS — D649 Anemia, unspecified: Secondary | ICD-10-CM | POA: Diagnosis not present

## 2019-12-19 DIAGNOSIS — I110 Hypertensive heart disease with heart failure: Secondary | ICD-10-CM | POA: Diagnosis not present

## 2019-12-19 DIAGNOSIS — I4891 Unspecified atrial fibrillation: Secondary | ICD-10-CM | POA: Diagnosis not present

## 2019-12-19 DIAGNOSIS — Z7982 Long term (current) use of aspirin: Secondary | ICD-10-CM | POA: Diagnosis not present

## 2019-12-19 DIAGNOSIS — K589 Irritable bowel syndrome without diarrhea: Secondary | ICD-10-CM | POA: Diagnosis not present

## 2019-12-19 DIAGNOSIS — Z96642 Presence of left artificial hip joint: Secondary | ICD-10-CM | POA: Diagnosis not present

## 2019-12-19 DIAGNOSIS — Z96652 Presence of left artificial knee joint: Secondary | ICD-10-CM | POA: Diagnosis not present

## 2019-12-19 DIAGNOSIS — Z7952 Long term (current) use of systemic steroids: Secondary | ICD-10-CM | POA: Diagnosis not present

## 2019-12-19 DIAGNOSIS — I272 Pulmonary hypertension, unspecified: Secondary | ICD-10-CM | POA: Diagnosis not present

## 2019-12-19 DIAGNOSIS — I447 Left bundle-branch block, unspecified: Secondary | ICD-10-CM | POA: Diagnosis not present

## 2019-12-19 DIAGNOSIS — M810 Age-related osteoporosis without current pathological fracture: Secondary | ICD-10-CM | POA: Diagnosis not present

## 2019-12-19 DIAGNOSIS — K219 Gastro-esophageal reflux disease without esophagitis: Secondary | ICD-10-CM | POA: Diagnosis not present

## 2019-12-19 DIAGNOSIS — K5790 Diverticulosis of intestine, part unspecified, without perforation or abscess without bleeding: Secondary | ICD-10-CM | POA: Diagnosis not present

## 2019-12-19 DIAGNOSIS — I428 Other cardiomyopathies: Secondary | ICD-10-CM | POA: Diagnosis not present

## 2019-12-19 DIAGNOSIS — Z471 Aftercare following joint replacement surgery: Secondary | ICD-10-CM | POA: Diagnosis not present

## 2019-12-20 ENCOUNTER — Encounter: Payer: Self-pay | Admitting: Family Medicine

## 2019-12-20 ENCOUNTER — Other Ambulatory Visit: Payer: Self-pay | Admitting: Cardiovascular Disease

## 2019-12-20 NOTE — Telephone Encounter (Signed)
A.Fib 86 yo 69.4 kg Scr: 0.87  No Drug interactions noted   See note from 06-25-2019. On low dose d/t previous bleeding per Dr Duke Salvia

## 2019-12-24 DIAGNOSIS — I5041 Acute combined systolic (congestive) and diastolic (congestive) heart failure: Secondary | ICD-10-CM | POA: Diagnosis not present

## 2019-12-24 DIAGNOSIS — Z96652 Presence of left artificial knee joint: Secondary | ICD-10-CM | POA: Diagnosis not present

## 2019-12-24 DIAGNOSIS — I110 Hypertensive heart disease with heart failure: Secondary | ICD-10-CM | POA: Diagnosis not present

## 2019-12-24 DIAGNOSIS — I4891 Unspecified atrial fibrillation: Secondary | ICD-10-CM | POA: Diagnosis not present

## 2019-12-24 DIAGNOSIS — Z7982 Long term (current) use of aspirin: Secondary | ICD-10-CM | POA: Diagnosis not present

## 2019-12-24 DIAGNOSIS — I272 Pulmonary hypertension, unspecified: Secondary | ICD-10-CM | POA: Diagnosis not present

## 2019-12-24 DIAGNOSIS — I447 Left bundle-branch block, unspecified: Secondary | ICD-10-CM | POA: Diagnosis not present

## 2019-12-24 DIAGNOSIS — Z7952 Long term (current) use of systemic steroids: Secondary | ICD-10-CM | POA: Diagnosis not present

## 2019-12-24 DIAGNOSIS — K589 Irritable bowel syndrome without diarrhea: Secondary | ICD-10-CM | POA: Diagnosis not present

## 2019-12-24 DIAGNOSIS — K219 Gastro-esophageal reflux disease without esophagitis: Secondary | ICD-10-CM | POA: Diagnosis not present

## 2019-12-24 DIAGNOSIS — Z471 Aftercare following joint replacement surgery: Secondary | ICD-10-CM | POA: Diagnosis not present

## 2019-12-24 DIAGNOSIS — M810 Age-related osteoporosis without current pathological fracture: Secondary | ICD-10-CM | POA: Diagnosis not present

## 2019-12-24 DIAGNOSIS — Z96642 Presence of left artificial hip joint: Secondary | ICD-10-CM | POA: Diagnosis not present

## 2019-12-24 DIAGNOSIS — D649 Anemia, unspecified: Secondary | ICD-10-CM | POA: Diagnosis not present

## 2019-12-24 DIAGNOSIS — I428 Other cardiomyopathies: Secondary | ICD-10-CM | POA: Diagnosis not present

## 2019-12-24 DIAGNOSIS — K5790 Diverticulosis of intestine, part unspecified, without perforation or abscess without bleeding: Secondary | ICD-10-CM | POA: Diagnosis not present

## 2019-12-31 DIAGNOSIS — M810 Age-related osteoporosis without current pathological fracture: Secondary | ICD-10-CM | POA: Diagnosis not present

## 2019-12-31 DIAGNOSIS — I5041 Acute combined systolic (congestive) and diastolic (congestive) heart failure: Secondary | ICD-10-CM | POA: Diagnosis not present

## 2019-12-31 DIAGNOSIS — Z471 Aftercare following joint replacement surgery: Secondary | ICD-10-CM | POA: Diagnosis not present

## 2019-12-31 DIAGNOSIS — D649 Anemia, unspecified: Secondary | ICD-10-CM | POA: Diagnosis not present

## 2019-12-31 DIAGNOSIS — Z96642 Presence of left artificial hip joint: Secondary | ICD-10-CM | POA: Diagnosis not present

## 2019-12-31 DIAGNOSIS — K589 Irritable bowel syndrome without diarrhea: Secondary | ICD-10-CM | POA: Diagnosis not present

## 2019-12-31 DIAGNOSIS — I428 Other cardiomyopathies: Secondary | ICD-10-CM | POA: Diagnosis not present

## 2019-12-31 DIAGNOSIS — Z96652 Presence of left artificial knee joint: Secondary | ICD-10-CM | POA: Diagnosis not present

## 2019-12-31 DIAGNOSIS — Z7952 Long term (current) use of systemic steroids: Secondary | ICD-10-CM | POA: Diagnosis not present

## 2019-12-31 DIAGNOSIS — K219 Gastro-esophageal reflux disease without esophagitis: Secondary | ICD-10-CM | POA: Diagnosis not present

## 2019-12-31 DIAGNOSIS — I447 Left bundle-branch block, unspecified: Secondary | ICD-10-CM | POA: Diagnosis not present

## 2019-12-31 DIAGNOSIS — K5790 Diverticulosis of intestine, part unspecified, without perforation or abscess without bleeding: Secondary | ICD-10-CM | POA: Diagnosis not present

## 2019-12-31 DIAGNOSIS — I4891 Unspecified atrial fibrillation: Secondary | ICD-10-CM | POA: Diagnosis not present

## 2019-12-31 DIAGNOSIS — Z7982 Long term (current) use of aspirin: Secondary | ICD-10-CM | POA: Diagnosis not present

## 2019-12-31 DIAGNOSIS — I272 Pulmonary hypertension, unspecified: Secondary | ICD-10-CM | POA: Diagnosis not present

## 2019-12-31 DIAGNOSIS — I110 Hypertensive heart disease with heart failure: Secondary | ICD-10-CM | POA: Diagnosis not present

## 2020-01-03 DIAGNOSIS — M25561 Pain in right knee: Secondary | ICD-10-CM | POA: Diagnosis not present

## 2020-01-03 DIAGNOSIS — Z9889 Other specified postprocedural states: Secondary | ICD-10-CM | POA: Diagnosis not present

## 2020-01-05 ENCOUNTER — Encounter: Payer: Self-pay | Admitting: Family Medicine

## 2020-01-06 ENCOUNTER — Encounter: Payer: Self-pay | Admitting: Family Medicine

## 2020-01-07 DIAGNOSIS — M810 Age-related osteoporosis without current pathological fracture: Secondary | ICD-10-CM | POA: Diagnosis not present

## 2020-01-07 DIAGNOSIS — K589 Irritable bowel syndrome without diarrhea: Secondary | ICD-10-CM | POA: Diagnosis not present

## 2020-01-07 DIAGNOSIS — D649 Anemia, unspecified: Secondary | ICD-10-CM | POA: Diagnosis not present

## 2020-01-07 DIAGNOSIS — K5790 Diverticulosis of intestine, part unspecified, without perforation or abscess without bleeding: Secondary | ICD-10-CM | POA: Diagnosis not present

## 2020-01-07 DIAGNOSIS — K219 Gastro-esophageal reflux disease without esophagitis: Secondary | ICD-10-CM | POA: Diagnosis not present

## 2020-01-07 DIAGNOSIS — Z96642 Presence of left artificial hip joint: Secondary | ICD-10-CM | POA: Diagnosis not present

## 2020-01-07 DIAGNOSIS — Z7952 Long term (current) use of systemic steroids: Secondary | ICD-10-CM | POA: Diagnosis not present

## 2020-01-07 DIAGNOSIS — I110 Hypertensive heart disease with heart failure: Secondary | ICD-10-CM | POA: Diagnosis not present

## 2020-01-07 DIAGNOSIS — Z7982 Long term (current) use of aspirin: Secondary | ICD-10-CM | POA: Diagnosis not present

## 2020-01-07 DIAGNOSIS — I447 Left bundle-branch block, unspecified: Secondary | ICD-10-CM | POA: Diagnosis not present

## 2020-01-07 DIAGNOSIS — Z96652 Presence of left artificial knee joint: Secondary | ICD-10-CM | POA: Diagnosis not present

## 2020-01-07 DIAGNOSIS — I4891 Unspecified atrial fibrillation: Secondary | ICD-10-CM | POA: Diagnosis not present

## 2020-01-07 DIAGNOSIS — I272 Pulmonary hypertension, unspecified: Secondary | ICD-10-CM | POA: Diagnosis not present

## 2020-01-07 DIAGNOSIS — I428 Other cardiomyopathies: Secondary | ICD-10-CM | POA: Diagnosis not present

## 2020-01-07 DIAGNOSIS — Z471 Aftercare following joint replacement surgery: Secondary | ICD-10-CM | POA: Diagnosis not present

## 2020-01-07 DIAGNOSIS — I5041 Acute combined systolic (congestive) and diastolic (congestive) heart failure: Secondary | ICD-10-CM | POA: Diagnosis not present

## 2020-01-08 ENCOUNTER — Other Ambulatory Visit: Payer: Self-pay | Admitting: Family Medicine

## 2020-01-08 DIAGNOSIS — R300 Dysuria: Secondary | ICD-10-CM

## 2020-01-14 ENCOUNTER — Ambulatory Visit: Payer: Medicare Other | Admitting: Family Medicine

## 2020-01-14 ENCOUNTER — Telehealth: Payer: Self-pay | Admitting: Family Medicine

## 2020-01-14 ENCOUNTER — Other Ambulatory Visit: Payer: Self-pay

## 2020-01-14 DIAGNOSIS — M810 Age-related osteoporosis without current pathological fracture: Secondary | ICD-10-CM | POA: Diagnosis not present

## 2020-01-14 DIAGNOSIS — I272 Pulmonary hypertension, unspecified: Secondary | ICD-10-CM | POA: Diagnosis not present

## 2020-01-14 DIAGNOSIS — Z96652 Presence of left artificial knee joint: Secondary | ICD-10-CM | POA: Diagnosis not present

## 2020-01-14 DIAGNOSIS — Z471 Aftercare following joint replacement surgery: Secondary | ICD-10-CM | POA: Diagnosis not present

## 2020-01-14 DIAGNOSIS — I428 Other cardiomyopathies: Secondary | ICD-10-CM | POA: Diagnosis not present

## 2020-01-14 DIAGNOSIS — I447 Left bundle-branch block, unspecified: Secondary | ICD-10-CM | POA: Diagnosis not present

## 2020-01-14 DIAGNOSIS — I4891 Unspecified atrial fibrillation: Secondary | ICD-10-CM | POA: Diagnosis not present

## 2020-01-14 DIAGNOSIS — D649 Anemia, unspecified: Secondary | ICD-10-CM | POA: Diagnosis not present

## 2020-01-14 DIAGNOSIS — Z7952 Long term (current) use of systemic steroids: Secondary | ICD-10-CM | POA: Diagnosis not present

## 2020-01-14 DIAGNOSIS — I110 Hypertensive heart disease with heart failure: Secondary | ICD-10-CM | POA: Diagnosis not present

## 2020-01-14 DIAGNOSIS — K5790 Diverticulosis of intestine, part unspecified, without perforation or abscess without bleeding: Secondary | ICD-10-CM | POA: Diagnosis not present

## 2020-01-14 DIAGNOSIS — I5041 Acute combined systolic (congestive) and diastolic (congestive) heart failure: Secondary | ICD-10-CM | POA: Diagnosis not present

## 2020-01-14 DIAGNOSIS — K219 Gastro-esophageal reflux disease without esophagitis: Secondary | ICD-10-CM | POA: Diagnosis not present

## 2020-01-14 DIAGNOSIS — K589 Irritable bowel syndrome without diarrhea: Secondary | ICD-10-CM | POA: Diagnosis not present

## 2020-01-14 DIAGNOSIS — Z96642 Presence of left artificial hip joint: Secondary | ICD-10-CM | POA: Diagnosis not present

## 2020-01-14 DIAGNOSIS — Z7982 Long term (current) use of aspirin: Secondary | ICD-10-CM | POA: Diagnosis not present

## 2020-01-14 NOTE — Telephone Encounter (Signed)
Patient came in thinking she had an appt she was worked in [employer] by [employer] and ofm patient decided to not wait any longer and left without being seen.

## 2020-01-15 NOTE — Telephone Encounter (Signed)
Please see what details you can get on this situation.   I was told that the patient had canceled her appointment via phone (potentially unintentionally as part of the automated appointment reminder) and I was willing to work her back into the schedule yesterday when I found out she was in clinic.  Thanks.

## 2020-01-15 NOTE — Telephone Encounter (Signed)
See phone message

## 2020-01-16 NOTE — Telephone Encounter (Signed)
Patient is aware that she mistakenly cancelled the appt and she called in to correct her mistake and was told it was taken care of but apparently it wasn't.  Patient totally understood and left because she had another matter to attend to.  She rescheduled for Friday but after speaking with her, she agreed that she really only needs the referral to with Dr. Sherron Monday.  Patient thought she had to see you in order to get this referral.  I advised her that you could do that without an OV so the patient would like to have that done and cancel the appt for Friday.

## 2020-01-17 ENCOUNTER — Encounter: Payer: Self-pay | Admitting: Family Medicine

## 2020-01-17 ENCOUNTER — Ambulatory Visit: Payer: Medicare Other | Admitting: Family Medicine

## 2020-01-17 NOTE — Telephone Encounter (Addendum)
Noted. Thanks.  Referral prev ordered.

## 2020-01-22 DIAGNOSIS — M1711 Unilateral primary osteoarthritis, right knee: Secondary | ICD-10-CM | POA: Diagnosis not present

## 2020-01-23 ENCOUNTER — Ambulatory Visit (INDEPENDENT_AMBULATORY_CARE_PROVIDER_SITE_OTHER): Payer: Medicare Other

## 2020-01-23 ENCOUNTER — Other Ambulatory Visit: Payer: Self-pay

## 2020-01-23 DIAGNOSIS — Z23 Encounter for immunization: Secondary | ICD-10-CM

## 2020-01-24 ENCOUNTER — Other Ambulatory Visit: Payer: Self-pay | Admitting: Orthopaedic Surgery

## 2020-01-27 ENCOUNTER — Telehealth: Payer: Self-pay | Admitting: Cardiovascular Disease

## 2020-01-27 NOTE — Telephone Encounter (Signed)
   Primary Cardiologist: [name], MD  Chart reviewed as part of pre-operative protocol coverage. Patient was contacted 01/27/2020 in reference to pre-operative risk assessment for pending surgery as outlined below.  [name] was last seen on 11/26/19 by Dr. Sherronda Sweigert Salvia.  During that visit, Dr. Sabastian Raimondi Salvia cleared her for knee surgery using spinal anesthesia with OK to hold eliquis.  She did well with the surgery without cardiac complications. She is now scheduled for right knee surgery using spinal anesthesia. She can complete 4.0 METS. She understand that she is at greater risk of complications due to cardia co-morbidities. She wishes to proceed with surgery.  Per Dr. Arshan Jabs Salvia: "OK to hold Eliquis per anesthesia's recommendations pre-operatively."  Therefore, based on ACC/AHA guidelines, the patient would be at acceptable risk for the planned procedure without further cardiovascular testing.   The patient was advised that if she develops new symptoms prior to surgery to contact our office to arrange for a follow-up visit, and she verbalized understanding.  I will route this recommendation to the requesting party via Epic fax function and remove from pre-op pool. Please call with questions.  Roe Rutherford Song Garris, PA 01/27/2020, 12:15 PM

## 2020-01-27 NOTE — Telephone Encounter (Signed)
   [facility] Pre-operative Risk Assessment    HEARTCARE STAFF: - Please ensure there is not already an duplicate clearance open for this procedure. - Under Visit Info/Reason for Call, type in Other and utilize the format Clearance MM/DD/YY or Clearance TBD. Do not use dashes or single digits. - If request is for dental extraction, please clarify the # of teeth to be extracted.  Request for surgical clearance:  1. What type of surgery is being performed? Right knee arthoplasty  2. When is this surgery scheduled? 02/11/20   3. What type of clearance is required (medical clearance vs. Pharmacy clearance to hold med vs. Both)? both  4. Are there any medications that need to be held prior to surgery and how long? Unsure, up to cardiologist        5. Practice name and name of physician performing surgery? [facility] and Dr. Melrose Nakayama  6. What is the office phone number? [phone]   7.   What is the office fax number? [phone]  8.   Anesthesia type (None, local, MAC, general) ? Spinal   Leah Newnam 01/27/2020, 9:15 AM  _________________________________________________________________   (provider comments below)

## 2020-01-28 DIAGNOSIS — R350 Frequency of micturition: Secondary | ICD-10-CM | POA: Diagnosis not present

## 2020-01-29 NOTE — Patient Instructions (Addendum)
DUE TO COVID-19 ONLY ONE VISITOR IS ALLOWED TO COME WITH YOU AND STAY IN THE WAITING ROOM ONLY DURING PRE OP AND  PROCEDURE.   IF YOU WILL BE ADMITTED INTO THE HOSPITAL YOU ARE ALLOWED ONE SUPPORT PERSON DURING VISITATION HOURS ONLY (10AM -8PM)   . The support person may change daily. . The support person must pass our screening, gel in and out, and wear a mask at all times, including in the patient's room. . Patients must also wear a mask when staff or their support person are in the room.   COVID SWAB TESTING MUST BE COMPLETED ON:  Friday, 02-14-20 @ 11:30 AM   [address] [place], [place] [address]  (Must self quarantine after testing. Follow instructions on handout.)    Your procedure is scheduled on:  Tuesday, 02-18-20   Report to [facility] Main  Entrance   Report to Short Stay at 5:30 AM   [facility])    Call this number if you have problems the morning of surgery [phone]   Do not eat food :After Midnight.   May have liquids until 4:30 AM day of surgery  CLEAR LIQUID DIET  Foods Allowed                                                                     Foods Excluded  Water, Black Coffee and tea, regular and decaf              liquids that you cannot  Plain Jell-O in any flavor  (No red)                                     see through such as: Fruit ices (not with fruit pulp)                                      milk, soups, orange juice              Iced Popsicles (No red)                                      All solid food                                   Apple juices Sports drinks like Gatorade (No red) Lightly seasoned clear broth or consume(fat free) Sugar, honey syrup     Complete one Ensure drink the morning of surgery at 4:30 AM  the day of surgery.    Oral Hygiene is also important to reduce your risk of infection.                                    Remember - BRUSH YOUR TEETH THE MORNING OF SURGERY WITH YOUR REGULAR TOOTHPASTE   Do NOT  smoke after Midnight   Take  these medicines the morning of surgery with A SIP OF WATER: Digoxin, Metoprolol                                You may not have any metal on your body including hair pins, jewelry, and body piercings             Do not wear make-up, lotions, powders, perfumes/cologne, or deodorant             Do not wear nail polish.  Do not shave  48 hours prior to surgery.     Do not bring valuables to the hospital. [facility] IS NOT  RESPONSIBLE   FOR VALUABLES.   Contacts, dentures or bridgework may not be worn into surgery.       Patients discharged the day of surgery will not be allowed to drive home.               Please read over the following fact sheets you were given: IF YOU HAVE QUESTIONS ABOUT YOUR PRE OP INSTRUCTIONS PLEASE CALL [phone]   [facility] - Preparing for Surgery Before surgery, you can play an important role.  Because skin is not sterile, your skin needs to be as free of germs as possible.  You can reduce the number of germs on your skin by washing with CHG (chlorahexidine gluconate) soap before surgery.  CHG is an antiseptic cleaner which kills germs and bonds with the skin to continue killing germs even after washing. Please DO NOT use if you have an allergy to CHG or antibacterial soaps.  If your skin becomes reddened/irritated stop using the CHG and inform your nurse when you arrive at Short Stay. Do not shave (including legs and underarms) for at least 48 hours prior to the first CHG shower.  You may shave your face/neck.  Please follow these instructions carefully:  1.  Shower with CHG Soap the night before surgery and the  morning of surgery.  2.  If you choose to wash your hair, wash your hair first as usual with your normal  shampoo.  3.  After you shampoo, rinse your hair and body thoroughly to remove the shampoo.                             4.  Use CHG as you would any other liquid soap.  You can apply chg directly to the skin and  wash.  Gently with a scrungie or clean washcloth.  5.  Apply the CHG Soap to your body ONLY FROM THE NECK DOWN.   Do   not use on face/ open                           Wound or open sores. Avoid contact with eyes, ears mouth and   genitals (private parts).                       Wash face,  Genitals (private parts) with your normal soap.             6.  Wash thoroughly, paying special attention to the area where your    surgery  will be performed.  7.  Thoroughly rinse your body with warm water from the neck down.  8.  DO NOT shower/wash with your  normal soap after using and rinsing off the CHG Soap.                9.  Pat yourself dry with a clean towel.            10.  Wear clean pajamas.            11.  Place clean sheets on your bed the night of your first shower and do not  sleep with pets. Day of Surgery : Do not apply any lotions/deodorants the morning of surgery.  Please wear clean clothes to the hospital/surgery center.  FAILURE TO FOLLOW THESE INSTRUCTIONS MAY RESULT IN THE CANCELLATION OF YOUR SURGERY  PATIENT SIGNATURE_________________________________  NURSE SIGNATURE__________________________________  ________________________________________________________________________   [name]  An incentive spirometer is a tool that can help keep your lungs clear and active. This tool measures how well you are filling your lungs with each breath. Taking long deep breaths may help reverse or decrease the chance of developing breathing (pulmonary) problems (especially infection) following:  A long period of time when you are unable to move or be active. BEFORE THE PROCEDURE   If the spirometer includes an indicator to show your best effort, your nurse or respiratory therapist will set it to a desired goal.  If possible, sit up straight or lean slightly forward. Try not to slouch.  Hold the incentive spirometer in an upright position. INSTRUCTIONS FOR USE  1. Sit on the  edge of your bed if possible, or sit up as far as you can in bed or on a chair. 2. Hold the incentive spirometer in an upright position. 3. Breathe out normally. 4. Place the mouthpiece in your mouth and seal your lips tightly around it. 5. Breathe in slowly and as deeply as possible, raising the piston or the ball toward the top of the column. 6. Hold your breath for 3-5 seconds or for as long as possible. Allow the piston or ball to fall to the bottom of the column. 7. Remove the mouthpiece from your mouth and breathe out normally. 8. Rest for a few seconds and repeat Steps 1 through 7 at least 10 times every 1-2 hours when you are awake. Take your time and take a few normal breaths between deep breaths. 9. The spirometer may include an indicator to show your best effort. Use the indicator as a goal to work toward during each repetition. 10. After each set of 10 deep breaths, practice coughing to be sure your lungs are clear. If you have an incision (the cut made at the time of surgery), support your incision when coughing by placing a pillow or rolled up towels firmly against it. Once you are able to get out of bed, walk around indoors and cough well. You may stop using the incentive spirometer when instructed by your caregiver.  RISKS AND COMPLICATIONS  Take your time so you do not get dizzy or light-headed.  If you are in pain, you may need to take or ask for pain medication before doing incentive spirometry. It is harder to take a deep breath if you are having pain. AFTER USE  Rest and breathe slowly and easily.  It can be helpful to keep track of a log of your progress. Your caregiver can provide you with a simple table to help with this. If you are using the spirometer at home, follow these instructions: SEEK MEDICAL CARE IF:   You are having difficultly using the spirometer.  You have trouble using the spirometer as often as instructed.  Your pain medication is not giving enough  relief while using the spirometer.  You develop fever of 100.5 F (38.1 C) or higher. SEEK IMMEDIATE MEDICAL CARE IF:   You cough up bloody sputum that had not been present before.  You develop fever of 102 F (38.9 C) or greater.  You develop worsening pain at or near the incision site. MAKE SURE YOU:   Understand these instructions.  Will watch your condition.  Will get help right away if you are not doing well or get worse. Document Released: 08/08/2006 Document Revised: 06/20/2011 Document Reviewed: 10/09/2006 ExitCare Patient Information 2014 Exit[employer], [place].   ________________________________________________________________________  WHAT IS A BLOOD TRANSFUSION? Blood Transfusion Information  A transfusion is the replacement of blood or some of its parts. Blood is made up of multiple cells which provide different functions.  Red blood cells carry oxygen and are used for blood loss replacement.  White blood cells fight against infection.  Platelets control bleeding.  Plasma helps clot blood.  Other blood products are available for specialized needs, such as hemophilia or other clotting disorders. BEFORE THE TRANSFUSION  Who gives blood for transfusions?   Healthy volunteers who are fully evaluated to make sure their blood is safe. This is blood bank blood. Transfusion therapy is the safest it has ever been in the practice of medicine. Before blood is taken from a donor, a complete history is taken to make sure that person has no history of diseases nor engages in risky social behavior (examples are intravenous drug use or sexual activity with multiple partners). The donor's travel history is screened to minimize risk of transmitting infections, such as malaria. The donated blood is tested for signs of infectious diseases, such as HIV and hepatitis. The blood is then tested to be sure it is compatible with you in order to minimize the chance of a transfusion reaction. If  you or a relative donates blood, this is often done in anticipation of surgery and is not appropriate for emergency situations. It takes many days to process the donated blood. RISKS AND COMPLICATIONS Although transfusion therapy is very safe and saves many lives, the main dangers of transfusion include:   Getting an infectious disease.  Developing a transfusion reaction. This is an allergic reaction to something in the blood you were given. Every precaution is taken to prevent this. The decision to have a blood transfusion has been considered carefully by your caregiver before blood is given. Blood is not given unless the benefits outweigh the risks. AFTER THE TRANSFUSION  Right after receiving a blood transfusion, you will usually feel much better and more energetic. This is especially true if your red blood cells have gotten low (anemic). The transfusion raises the level of the red blood cells which carry oxygen, and this usually causes an energy increase.  The nurse administering the transfusion will monitor you carefully for complications. HOME CARE INSTRUCTIONS  No special instructions are needed after a transfusion. You may find your energy is better. Speak with your caregiver about any limitations on activity for underlying diseases you may have. SEEK MEDICAL CARE IF:   Your condition is not improving after your transfusion.  You develop redness or irritation at the intravenous (IV) site. SEEK IMMEDIATE MEDICAL CARE IF:  Any of the following symptoms occur over the next 12 hours:  Shaking chills.  You have a temperature by mouth above 102 F (38.9 C), not  controlled by medicine.  Chest, back, or muscle pain.  People around you feel you are not acting correctly or are confused.  Shortness of breath or difficulty breathing.  Dizziness and fainting.  You get a rash or develop hives.  You have a decrease in urine output.  Your urine turns a dark color or changes to pink,  red, or brown. Any of the following symptoms occur over the next 10 days:  You have a temperature by mouth above 102 F (38.9 C), not controlled by medicine.  Shortness of breath.  Weakness after normal activity.  The white part of the eye turns yellow (jaundice).  You have a decrease in the amount of urine or are urinating less often.  Your urine turns a dark color or changes to pink, red, or brown. Document Released: 03/25/2000 Document Revised: 06/20/2011 Document Reviewed: 11/12/2007 [facility] Patient Information 2014 [place], [place].  _______________________________________________________________________

## 2020-01-29 NOTE — Progress Notes (Addendum)
COVID Vaccine Completed:  x2 Date COVID Vaccine completed:  05-04-19 & 05-25-19 COVID vaccine manufacturer: Pfizer    Moderna   [employer]   PCP - Crawford Givens, MD Cardiologist - [name], MD  Clearance on chart dated 01-27-20 by A. Duke, PA  Chest x-ray - 08-06-19 in Epic EKG - 12-03-19 in Epic Stress Test -  ECHO - 08-05-19 in Epic Cardiac Cath -  Pacemaker/ICD device last checked:  Sleep Study -  CPAP -   Fasting Blood Sugar -  Checks Blood Sugar _____ times a day  Blood Thinner Instructions: Eliquis 2.5 mg BID.  Okay to hold per cardiac clearance.  Patient to call surgeon's office for clarification on when to stop Eliquis. Aspirin Instructions: Last Dose:  Anesthesia review:  Cardiomyopathy, LBBB, Afib, CHF, severe tricuspid regurg  Patient denies shortness of breath, fever, cough and chest pain at PAT appointment   Patient verbalized understanding of instructions that were given to them at the PAT appointment. Patient was also instructed that they will need to review over the PAT instructions again at home before surgery.

## 2020-01-30 ENCOUNTER — Encounter: Payer: Self-pay | Admitting: Family Medicine

## 2020-01-31 ENCOUNTER — Encounter (HOSPITAL_COMMUNITY)
Admission: RE | Admit: 2020-01-31 | Discharge: 2020-01-31 | Disposition: A | Discharge status: Home / Self Care | Payer: Medicare Other | Source: Ambulatory Visit | Attending: Orthopaedic Surgery | Admitting: Orthopaedic Surgery

## 2020-01-31 ENCOUNTER — Other Ambulatory Visit: Payer: Self-pay

## 2020-01-31 ENCOUNTER — Encounter (HOSPITAL_COMMUNITY): Payer: Self-pay

## 2020-01-31 DIAGNOSIS — I4891 Unspecified atrial fibrillation: Secondary | ICD-10-CM | POA: Insufficient documentation

## 2020-01-31 DIAGNOSIS — K219 Gastro-esophageal reflux disease without esophagitis: Secondary | ICD-10-CM | POA: Diagnosis not present

## 2020-01-31 DIAGNOSIS — I447 Left bundle-branch block, unspecified: Secondary | ICD-10-CM | POA: Insufficient documentation

## 2020-01-31 DIAGNOSIS — M1711 Unilateral primary osteoarthritis, right knee: Secondary | ICD-10-CM | POA: Insufficient documentation

## 2020-01-31 DIAGNOSIS — Z01812 Encounter for preprocedural laboratory examination: Secondary | ICD-10-CM | POA: Insufficient documentation

## 2020-01-31 DIAGNOSIS — Z79899 Other long term (current) drug therapy: Secondary | ICD-10-CM | POA: Insufficient documentation

## 2020-01-31 DIAGNOSIS — Z7901 Long term (current) use of anticoagulants: Secondary | ICD-10-CM | POA: Insufficient documentation

## 2020-01-31 DIAGNOSIS — I5042 Chronic combined systolic (congestive) and diastolic (congestive) heart failure: Secondary | ICD-10-CM | POA: Diagnosis not present

## 2020-01-31 LAB — SURGICAL PCR SCREEN
MRSA, PCR: NEGATIVE
Staphylococcus aureus: NEGATIVE

## 2020-02-03 NOTE — Progress Notes (Signed)
Anesthesia Chart Review:   Case: [id number] Date/Time: 02/18/20 0715   Procedure: RIGHT TOTAL KNEE ARTHROPLASTY (Right Knee)   Anesthesia type: Spinal   Pre-op diagnosis: RIGHT KNEE DEGENERATIVE JOINT DISEASE   Location: [facility] / [facility]   Surgeons: [name], MD      DISCUSSION:  Pt is an 86 year old with hx chronic systolic and diastolic  heart failure, cardiomyopathy, atrial fibrillation, pulmonary HTN, LBBB  S/p L total knee revision 12/03/19.  S/p L hemi hip arthroplasty 08/05/19  Pt has not yet received instructions for when to hold eliquis. RN instructed pt to contact surgeon's office.     VS: BP 132/74   Pulse 84   Temp 36.9 C (Oral)   Resp 14   Ht 5\' 5"  (1.651 m)   Wt 70.5 kg   SpO2 99%   BMI 25.86 kg/m   PROVIDERS: - PCP is , MD - Cardiologist is [name], MD.  Cleared pt for prior surgery (L knee revision on 12/03/19) at last office visit 11/26/19.    LABS: Will be obtained day of surgery    IMAGES: 1 view CXR 08/06/19: Cardiac enlargement without acute abnormality.   EKG 12/03/19: Atrial fibrillation. Left axis deviation. LBBB.    CV:  Echo 08/05/19:  1. Left ventricular ejection fraction, by estimation, is 35%. The left ventricle has moderate to severely decreased function. The left ventricle demonstrates global hypokinesis. There is moderate left ventricular hypertrophy. Left ventricular diastolic parameters are indeterminate. There is the interventricular septum is flattened in diastole ('D' shaped left ventricle), consistent with right ventricular volume overload.  2. Right ventricular systolic function is moderately reduced. The right ventricular size is moderately enlarged. There is severely elevated pulmonary artery systolic pressure. The estimated right ventricular systolic pressure is 83.2 mmHg.  3. Left atrial size was mildly dilated.  4. Right atrial size was severely dilated.  5. The mitral valve is normal in  structure. No evidence of mitral valve regurgitation. No evidence of mitral stenosis.  6. The tricuspid valve is degenerative. Tricuspid valve regurgitation is severe.  7. The aortic valve is grossly normal. Aortic valve regurgitation is not visualized. No aortic stenosis is present.  8. The inferior vena cava is dilated in size with <50% respiratory variability, suggesting right atrial pressure of 15 mmHg.  - Comparison(s): A prior study was performed on 08/03/2017. Prior images  reviewed side by side. RV size has increased and systolic function  decreased. TR has increased from mild-moderate to severe. LV function  appears slightly improved.    Nuclear stress test 05/04/17:  1. Potential subtle ischemia involving the left ventricular apex, distal septum and distal anteroseptal wall. Mildly diminished perfusion on both stress and rest studies of the distal anterior wall may be related to scar or breast soft tissue attenuation. 2. Severe wall motion abnormalities with septal dyskinesis, apical akinesis and otherwise global hypokinesis. The left ventricular cavity is significantly dilated. 3. Left ventricular ejection fraction: 23%. 4. Non invasive risk stratification*: High   Past Medical History:  Diagnosis Date  . Acute combined systolic and diastolic heart failure (HCC) 05/02/2017  . Anemia   . Anxiety   . Arthritis    "knees" (06/18/2015)  . Atrial fibrillation with RVR (HCC) 06/18/2015   h/o sig bleed on coumadin  . Cardiogenic shock (HCC) 05/02/2017  . CHF (congestive heart failure) (HCC)   . Complication of anesthesia 2010   "w/cardiac cath; got into a psychotic state for ~  3 days; got me out w/Valium"  . Depression    "I have it off and on; not as often as I've gotten older" (06/18/2015)  . Diverticulosis    descending and sigmoid colon--Dr. Jarold Motto  . DJD (degenerative joint disease) of knee    left  . Dyspnea   . Dysrhythmia   . E coli infection    History of  . GERD  (gastroesophageal reflux disease)   . History of Clostridium difficile colitis   . IBS (irritable bowel syndrome)   . Insomnia     off chronic ambien 5mg  as of 10/11, rare/episodic use since then  DCM/CHF  . LBBB (left bundle branch block)   . Lymphocytic colitis 09/27/2016  . Moderate to severe pulmonary hypertension (HCC) 11/26/2019  . Nonischemic cardiomyopathy (HCC)    normal coronaries, EF 15-20% 04/2008, EF 50-55% 2015  . Osteoporosis    on DXA 05/2010  . Positive PPD   . Retroperitoneal bleed   . Sciatica    Right    Past Surgical History:  Procedure Laterality Date  . ANTERIOR APPROACH HEMI HIP ARTHROPLASTY Left 08/05/2019   Procedure: ANTERIOR APPROACH HEMI HIP ARTHROPLASTY;  Surgeon: 08/07/2019, MD;  Location: [facility] OR;  Service: Orthopedics;  Laterality: Left;  . BIOPSY  11/22/2017   Procedure: BIOPSY;  Surgeon: 11/24/2017, MD;  Location: [facility];  Service: Endoscopy;;  . CARDIAC CATHETERIZATION  2010  . CARDIOVERSION N/A 06/19/2015   Procedure: CARDIOVERSION;  Surgeon: 08/19/2015, MD;  Location: [facility] ENDOSCOPY;  Service: Cardiovascular;  Laterality: N/A;  . CATARACT EXTRACTION W/ INTRAOCULAR LENS  IMPLANT, BILATERAL Bilateral   . COLONOSCOPY WITH PROPOFOL N/A 11/22/2017   Procedure: COLONOSCOPY WITH PROPOFOL;  Surgeon: 11/24/2017, MD;  Location: [facility];  Service: Endoscopy;  Laterality: N/A;  . DILATION AND CURETTAGE OF UTERUS    . TEE WITHOUT CARDIOVERSION N/A 06/19/2015   Procedure: TRANSESOPHAGEAL ECHOCARDIOGRAM (TEE);  Surgeon: 08/19/2015, MD;  Location: [facility] ENDOSCOPY;  Service: Cardiovascular;  Laterality: N/A;  . TOTAL KNEE ARTHROPLASTY Left 04/25/2017   Procedure: TOTAL KNEE ARTHROPLASTY;  Surgeon: 04/27/2017, MD;  Location: [facility] OR;  Service: Orthopedics;  Laterality: Left;  . TOTAL KNEE REVISION Left 12/03/2019   Procedure: LEFT TOTAL KNEE REVISION  OF POYLY EXCHANGE;  Surgeon: 12/05/2019, MD;  Location: [facility];  Service: Orthopedics;   Laterality: Left;  [name] VAGINAL HYSTERECTOMY  1979   ovaries intact    MEDICATIONS: . acetaminophen (TYLENOL) 500 MG tablet  . BIOTIN PO  . calcium carbonate (TUMS - DOSED IN MG ELEMENTAL CALCIUM) 500 MG chewable tablet  . Carboxymethylcellul-Glycerin ([employer] LUBRICATING/DRY EYE OP)  . cholecalciferol (VITAMIN D3) 25 MCG (1000 UNIT) tablet  . digoxin (LANOXIN) 0.125 MG tablet  . ELIQUIS 2.5 MG TABS tablet  . loperamide (IMODIUM) 2 MG capsule  . metoprolol succinate (TOPROL-XL) 50 MG 24 hr tablet  . mirabegron ER (MYRBETRIQ) 25 MG TB24 tablet  . phenylephrine (NEO-SYNEPHRINE) 1 % nasal spray  . Polyethyl Glycol-Propyl Glycol (SYSTANE OP)  . potassium chloride SA (KLOR-CON) 20 MEQ tablet  . predniSONE (DELTASONE) 10 MG tablet  . predniSONE (DELTASONE) 20 MG tablet  . tizanidine (ZANAFLEX) 2 MG capsule  . traMADol (ULTRAM) 50 MG tablet  . trolamine salicylate (ASPERCREME) 10 % cream   No current facility-administered medications for this encounter.    If labs acceptable day of surgery, I anticipate pt can proceed with surgery as scheduled.   [name], PhD, FNP-BC [facility]  [facility]/Anesthesiology Phone: [phone] 02/04/2020 9:58 AM

## 2020-02-04 NOTE — Anesthesia Preprocedure Evaluation (Addendum)
Anesthesia Evaluation  Patient identified by MRN, date of birth, ID band Patient awake  General Assessment Comment:With cath in 2010 "lasted 3 days"  Reviewed: Patient's Chart, lab work & pertinent test results  History of Anesthesia Complications (+) Emergence Delirium  Airway Mallampati: II  TM Distance: >3 FB Neck ROM: Full    Dental  (+) Teeth Intact   Pulmonary  pHTN   Pulmonary exam normal        Cardiovascular hypertension, Pt. on home beta blockers +CHF  + dysrhythmias Atrial Fibrillation  Rhythm:Irregular Rate:Normal + Systolic murmurs    Neuro/Psych Anxiety Depression    GI/Hepatic GERD  ,diverticulosis   Endo/Other    Renal/GU      Musculoskeletal  (+) Arthritis , Osteoarthritis,    Abdominal (+)  Abdomen: soft. Bowel sounds: normal.  Peds  Hematology  (+) anemia ,   Anesthesia Other Findings   Reproductive/Obstetrics                           Anesthesia Physical Anesthesia Plan  ASA: III  Anesthesia Plan: Regional and Spinal   Post-op Pain Management:  Regional for Post-op pain   Induction:   PONV Risk Score and Plan: 2 and Ondansetron, Dexamethasone, Treatment may vary due to age or medical condition and Propofol infusion  Airway Management Planned: Simple Face Mask and Nasal Cannula  Additional Equipment: None  Intra-op Plan:   Post-operative Plan: Extubation in OR  Informed Consent: I have reviewed the patients History and Physical, chart, labs and discussed the procedure including the risks, benefits and alternatives for the proposed anesthesia with the patient or authorized representative who has indicated his/her understanding and acceptance.     Dental advisory given  Plan Discussed with: CRNA  Anesthesia Plan Comments: (See APP note by Joslyn Hy, FNP  ECHO 04/21: 1. Left ventricular ejection fraction, by estimation, is 35%. The left ventricle  has moderate to severely decreased function. The left ventricle demonstrates global hypokinesis. There is moderate left ventricular hypertrophy. Left ventricular diastolic parameters are indeterminate. There is the interventricular septum is flattened in diastole ('D' shaped left ventricle), consistent with right ventricular volume overload.  2. Right ventricular systolic function is moderately reduced. The right ventricular size is moderately enlarged. There is severely elevated pulmonary artery systolic pressure. The estimated right ventricular systolic pressure is 83.2 mmHg.  3. Left atrial size was mildly dilated.  4. Right atrial size was severely dilated.  5. The mitral valve is normal in structure. No evidence of mitral valve regurgitation. No evidence of mitral stenosis.  6. The tricuspid valve is degenerative. Tricuspid valve regurgitation is severe.  7. The aortic valve is grossly normal. Aortic valve regurgitation is not visualized. No aortic stenosis is present.  8. The inferior vena cava is dilated in size with <50% respiratory variability, suggesting right atrial pressure of 15 mmHg.  - Comparison(s): A prior study was performed on 08/03/2017. Prior images  reviewed side by side. RV size has increased and systolic function  decreased. TR has increased from mild-moderate to severe. LV function  appears slightly improved. )     Anesthesia Quick Evaluation

## 2020-02-05 NOTE — Progress Notes (Signed)
[name] from Dr. Nolon Nations office returned call regarding 1 view CXR when a 2 view was ordered.  Dr. Jerl Santos will defer to anesthesia regarding this.  If anesthesia okay with 1 view CXR okay to proceed with surgery.

## 2020-02-06 ENCOUNTER — Encounter: Payer: Self-pay | Admitting: Family Medicine

## 2020-02-06 ENCOUNTER — Other Ambulatory Visit: Payer: Self-pay

## 2020-02-06 MED ORDER — METOPROLOL SUCCINATE ER 50 MG PO TB24
75.0000 mg | ORAL_TABLET | Freq: Every day | ORAL | 3 refills | Status: DC
Start: 2020-02-06 — End: 2020-02-07

## 2020-02-07 MED ORDER — METOPROLOL SUCCINATE ER 50 MG PO TB24: 75.0000 mg | ORAL_TABLET | Freq: Every day | ORAL | 3 refills | Status: DC

## 2020-02-10 NOTE — Progress Notes (Signed)
Pt was concerned that she was scheduled to come in for PST appt and her COVID appt on 02/14/20. Per review of the chart and speaking with Derek Mound, RN, pt initial surgery was schedule on 02-11-20. When patient arrived for her PST on 01-31-20 surgery date had changed to 02-18-20, resulting in more than 14 days, which any labs drawn on that day wold be invalid, and needed to be repeated the day of surgery. To resolve this, it was decided that labs would be drawn on day of surgery 02-18-20, and pt to have her scheduled COVID on 02-14-20.  Explained the above to the patient, and advised her not to come for a 2nd PST on 02-14-20. Rather she is only to go for her COVID test, and all labs that were not drawn on 01-31-20 would be drawn on day of surgery 02-18-20 when she arrives. Pt verbalized understanding, and stated that she would go for her COVID test on 02-14-20.

## 2020-02-11 ENCOUNTER — Telehealth (HOSPITAL_COMMUNITY): Payer: Self-pay | Admitting: Vascular Surgery

## 2020-02-11 NOTE — Telephone Encounter (Signed)
Spoke to pt to make new chf appt , pt states she is having Knee replacement surg (02/18/20) she will not make this appt at this time

## 2020-02-12 ENCOUNTER — Other Ambulatory Visit: Payer: Self-pay | Admitting: Family Medicine

## 2020-02-12 MED ORDER — SERTRALINE HCL 50 MG PO TABS: 50.0000 mg | ORAL_TABLET | Freq: Every day | ORAL | Status: DC

## 2020-02-14 ENCOUNTER — Other Ambulatory Visit (HOSPITAL_COMMUNITY)
Admission: RE | Admit: 2020-02-14 | Discharge: 2020-02-14 | Disposition: A | Discharge status: Home / Self Care | Payer: Medicare Other | Source: Ambulatory Visit | Attending: Orthopaedic Surgery | Admitting: Orthopaedic Surgery

## 2020-02-14 DIAGNOSIS — Z01812 Encounter for preprocedural laboratory examination: Secondary | ICD-10-CM | POA: Diagnosis not present

## 2020-02-14 DIAGNOSIS — Z20822 Contact with and (suspected) exposure to covid-19: Secondary | ICD-10-CM | POA: Insufficient documentation

## 2020-02-14 LAB — SARS CORONAVIRUS 2 (TAT 6-24 HRS): SARS Coronavirus 2: NEGATIVE

## 2020-02-14 NOTE — H&P (Signed)
TOTAL KNEE ADMISSION H&P  Patient is being admitted for right total knee arthroplasty.  Subjective:  Chief Complaint:right knee pain.  HPI: [name], 86 y.o. female, has a history of pain and functional disability in the right knee due to arthritis and has failed non-surgical conservative treatments for greater than 12 weeks to includeNSAID's and/or analgesics, corticosteriod injections, viscosupplementation injections, flexibility and strengthening excercises, supervised PT with diminished ADL's post treatment, use of assistive devices, weight reduction as appropriate and activity modification.  Onset of symptoms was gradual, starting 5 years ago with gradually worsening course since that time. The patient noted no past surgery on the right knee(s).  Patient currently rates pain in the right knee(s) at 10 out of 10 with activity. Patient has night pain, worsening of pain with activity and weight bearing, pain that interferes with activities of daily living, crepitus and joint swelling.  Patient has evidence of subchondral cysts, subchondral sclerosis, periarticular osteophytes and joint space narrowing by imaging studies. There is no active infection.  Patient Active Problem List   Diagnosis Date Noted  . 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 Left knee pain 12/03/2019  . Moderate to severe pulmonary hypertension (HCC) 11/26/2019  . Tremor of both hands 09/29/2019  . Dysuria 08/06/2019  . Acute kidney injury (HCC) 08/05/2019  . Severe tricuspid regurgitation   . S/P hip hemiarthroplasty 08/04/2019  . Ear symptom 12/30/2018  . Chronic diarrhea 09/07/2017  . Hoarseness of voice 09/07/2017  . Total knee replacement status, left 04/25/17 05/06/2017  . Acute combined systolic and diastolic heart failure (HCC) 05/02/2017  . Primary osteoarthritis of left knee 04/25/2017  . DJD (degenerative joint disease) 04/25/2017  . Healthcare maintenance 02/08/2017   . Vitamin D deficiency 02/08/2017  . Microscopic colitis 09/27/2016  . IBS (irritable bowel syndrome) 08/31/2016  . At risk for falling 01/07/2016  . Chronic combined systolic and diastolic heart failure (HCC)   . Rectus sheath hematoma- no anticoagulation   . Atrial fibrillation with RVR- CHADs VASc=4 06/18/2015  . Diarrhea 06/12/2015  . Advance care planning 10/23/2014  . Leg edema 06/27/2012  . Medicare annual wellness visit, subsequent 03/27/2012  . Depression 03/05/2012  . Acute lower UTI 03/05/2012  . EDEMA 06/07/2010  . Osteoporosis 05/23/2010  . KNEE PAIN, LEFT 03/02/2010  . LBBB (left bundle branch block) 10/28/2008  . POST MI SEPTAL DEFECT 06/24/2008  . Cough 06/24/2008  . Nonischemic cardiomyopathy (HCC) 05/30/2008  . ATRIAL FIBRILLATION 05/30/2008  . HYPONATREMIA, HX OF 05/30/2008   Past Medical History:  Diagnosis Date  . Acute combined systolic and diastolic heart failure (HCC) 05/02/2017  . Anemia   . Anxiety   . Arthritis    "knees" (06/18/2015)  . Atrial fibrillation with RVR (HCC) 06/18/2015   h/o sig bleed on coumadin  . Cardiogenic shock (HCC) 05/02/2017  . CHF (congestive heart failure) (HCC)   . Complication of anesthesia 2010   "w/cardiac cath; got into a psychotic state for ~ 3 days; got me out w/Valium"  . Depression    "I have it off and on; not as often as I've gotten older" (06/18/2015)  . Diverticulosis    descending and sigmoid colon--Dr. Jarold Motto  . DJD (degenerative joint disease) of knee    left  . Dyspnea   . Dysrhythmia   . E coli infection    History of  . GERD (gastroesophageal reflux disease)   . History of Clostridium difficile colitis   .  IBS (irritable bowel syndrome)   . Insomnia     off chronic ambien 5mg  as of 10/11, rare/episodic use since then  DCM/CHF  . LBBB (left bundle branch block)   . Lymphocytic colitis 09/27/2016  . Moderate to severe pulmonary hypertension (HCC) 11/26/2019  . Nonischemic cardiomyopathy (HCC)     normal coronaries, EF 15-20% 04/2008, EF 50-55% 2015  . Osteoporosis    on DXA 05/2010  . Positive PPD   . Retroperitoneal bleed   . Sciatica    Right    Past Surgical History:  Procedure Laterality Date  . ANTERIOR APPROACH HEMI HIP ARTHROPLASTY Left 08/05/2019   Procedure: ANTERIOR APPROACH HEMI HIP ARTHROPLASTY;  Surgeon: 08/07/2019, MD;  Location: [facility] OR;  Service: Orthopedics;  Laterality: Left;  . BIOPSY  11/22/2017   Procedure: BIOPSY;  Surgeon: 11/24/2017, MD;  Location: [facility];  Service: Endoscopy;;  . CARDIAC CATHETERIZATION  2010  . CARDIOVERSION N/A 06/19/2015   Procedure: CARDIOVERSION;  Surgeon: 08/19/2015, MD;  Location: [facility] ENDOSCOPY;  Service: Cardiovascular;  Laterality: N/A;  . CATARACT EXTRACTION W/ INTRAOCULAR LENS  IMPLANT, BILATERAL Bilateral   . COLONOSCOPY WITH PROPOFOL N/A 11/22/2017   Procedure: COLONOSCOPY WITH PROPOFOL;  Surgeon: 11/24/2017, MD;  Location: [facility] END[facility]COPY;  Service: Endoscopy;  Laterality: N/A;  . DILATION AND CURETTAGE OF UTERUS    . TEE WITHOUT CARDIOVERSION N/A 06/19/2015   Procedure: TRANSESOPHAGEAL ECHOCARDIOGRAM (TEE);  Surgeon: 08/19/2015, MD;  Location: [facility] ENDOSCOPY;  Service: Cardiovascular;  Laterality: N/A;  . TOTAL KNEE ARTHROPLASTY Left 04/25/2017   Procedure: TOTAL KNEE ARTHROPLASTY;  Surgeon: 04/27/2017, MD;  Location: [facility] OR;  Service: Orthopedics;  Laterality: Left;  . TOTAL KNEE REVISION Left 12/03/2019   Procedure: LEFT TOTAL KNEE REVISION  OF POYLY EXCHANGE;  Surgeon: 12/05/2019, MD;  Location: [facility];  Service: Orthopedics;  Laterality: Left;  [name] VAGINAL HYSTERECTOMY  1979   ovaries intact    No current facility-administered medications for this encounter.   Current Outpatient Medications  Medication Sig Dispense Refill Last Dose  . acetaminophen (TYLENOL) 500 MG tablet Take 1,000 mg by mouth 3 (three) times daily as needed for moderate pain or headache.      [name] BIOTIN PO Take 1 tablet by mouth  daily.     . calcium carbonate (TUMS - DOSED IN MG ELEMENTAL CALCIUM) 500 MG chewable tablet Chew 1 tablet by mouth 2 (two) times daily as needed for indigestion or heartburn.      . Carboxymethylcellul-Glycerin ([employer] LUBRICATING/DRY EYE OP) Place 1-2 drops into both eyes daily as needed (for dry eyes).     . cholecalciferol (VITAMIN D3) 25 MCG (1000 UNIT) tablet Take 1,000 Units by mouth daily.     . digoxin (LANOXIN) 0.125 MG tablet Take 1 tablet (0.125 mg total) by mouth daily. 90 tablet 3   . ELIQUIS 2.5 MG TABS tablet TAKE 1 TABLET BY MOUTH TWICE A DAY (Patient taking differently: Take 2.5 mg by mouth 2 (two) times daily. ) 180 tablet 1   . loperamide (IMODIUM) 2 MG capsule Take 2 mg by mouth 3 (three) times daily as needed for diarrhea or loose stools.      . phenylephrine (NEO-SYNEPHRINE) 1 % nasal spray Place 1 drop into both nostrils every 6 (six) hours as needed for congestion.     [name] Glycol-Propyl Glycol (SYSTANE OP) Place 1 drop into both eyes daily as needed (dry eyes).     Bertram Gala  potassium chloride SA (KLOR-CON) 20 MEQ tablet Take 20 mEq by mouth daily.     . predniSONE (DELTASONE) 20 MG tablet Take 20 mg by mouth every other day.     . tizanidine (ZANAFLEX) 2 MG capsule Take 2 mg by mouth at bedtime as needed for muscle spasms.      . traMADol (ULTRAM) 50 MG tablet Take 50 mg by mouth at bedtime as needed for moderate pain.     [name] trolamine salicylate (ASPERCREME) 10 % cream Apply 1 application topically at bedtime.      . metoprolol succinate (TOPROL-XL) 50 MG 24 hr tablet Take 1.5 tablets (75 mg total) by mouth daily. Take with or immediately following a meal. 135 tablet 3   . mirabegron ER (MYRBETRIQ) 25 MG TB24 tablet Take 1 tablet (25 mg total) by mouth daily. (Patient not taking: Reported on 11/28/2019) 30 tablet 1 Not Taking at Unknown time  . predniSONE (DELTASONE) 10 MG tablet Take 2 tablets (20 mg total) by mouth daily with breakfast. (Patient not taking: Reported on  01/27/2020) 100 tablet 0 Not Taking at Unknown time  . sertraline (ZOLOFT) 50 MG tablet Take 1 tablet (50 mg total) by mouth daily.      Allergies  Allergen Reactions  . Ace Inhibitors Cough  . Warfarin Sodium Other (See Comments)    DOSE RELATED PHARMACOLOGIC EFFECT "bleed out"  . Famotidine Other (See Comments)    GI upset  . Codeine Other (See Comments)    sedation  . Delsym [Dextromethorphan Polistirex Er] Other (See Comments)    dizziness  . Lactose Intolerance (Gi) Diarrhea  . Phenylephrine Palpitations and Other (See Comments)    Nasal spray- "likely increase in nasal congestion".   . Sulfamethoxazole-Trimethoprim Nausea And Vomiting    GI intolerance.    Social History   Tobacco Use  . Smoking status: Never Smoker  . Smokeless tobacco: Never Used  Substance Use Topics  . Alcohol use: No    Alcohol/week: 0.0 standard drinks    Family History  Problem Relation Age of Onset  . Colon cancer Father        possible colon cancer  . Other Father        esophagus tumor?  . Tuberculosis Mother   . Breast cancer Neg Hx   . Stomach cancer Neg Hx   . Pancreatic cancer Neg Hx      Review of Systems  Musculoskeletal: Positive for arthralgias.       Right knee  All other systems reviewed and are negative.   Objective:  Physical Exam Constitutional:      Appearance: Normal appearance.  HENT:     Head: Normocephalic and atraumatic.     Nose: Nose normal.     Mouth/Throat:     Pharynx: Oropharynx is clear.  Eyes:     Extraocular Movements: Extraocular movements intact.  Cardiovascular:     Rate and Rhythm: Normal rate.     Pulses: Normal pulses.  Pulmonary:     Effort: Pulmonary effort is normal.  Abdominal:     Palpations: Abdomen is soft.  Musculoskeletal:     Cervical back: Normal range of motion.     Comments: Right knee has a moderate valgus deformity with range of motion from 0-110.  She has joint line pain and crepitation but no effusion.  On the  replaced left side her motion is about 0-110 with the healed scar and good stability in extension.  Sensation and motor function  remain intact distally with palpable pulses in her feet.    Skin:    General: Skin is warm and dry.  Neurological:     General: No focal deficit present.     Mental Status: She is alert and oriented to person, place, and time.  Psychiatric:        Mood and Affect: Mood normal.        Behavior: Behavior normal.        Thought Content: Thought content normal.        Judgment: Judgment normal.     Vital signs in last 24 hours:    Labs:   Estimated body mass index is 25.86 kg/m as calculated from the following:   Height as of 01/31/20: 5\' 5"  (1.651 m).   Weight as of 01/31/20: 70.5 kg.   Imaging Review Plain radiographs demonstrate severe degenerative joint disease of the right knee(s). The overall alignment isneutral. The bone quality appears to be good for age and reported activity level.      Assessment/Plan:  End stage arthritis, right knee   The patient history, physical examination, clinical judgment of the provider and imaging studies are consistent with end stage degenerative joint disease of the right knee(s) and total knee arthroplasty is deemed medically necessary. The treatment options including medical management, injection therapy arthroscopy and arthroplasty were discussed at length. The risks and benefits of total knee arthroplasty were presented and reviewed. The risks due to aseptic loosening, infection, stiffness, patella tracking problems, thromboembolic complications and other imponderables were discussed. The patient acknowledged the explanation, agreed to proceed with the plan and consent was signed. Patient is being admitted for inpatient treatment for surgery, pain control, PT, OT, prophylactic antibiotics, VTE prophylaxis, progressive ambulation and ADL's and discharge planning. The patient is planning to be discharged home with  home health services  Patient's anticipated LOS is less than 2 midnights, meeting these requirements: - Younger than 83 - Lives within 1 hour of care - Has a competent adult at home to recover with post-op recover - NO history of  - Chronic pain requiring opiods  - Diabetes  - Coronary Artery Disease  - Heart failure  - Heart attack  - Stroke  - DVT/VTE  - Cardiac arrhythmia  - Respiratory Failure/COPD  - Renal failure  - Anemia  - Advanced Liver disease

## 2020-02-17 ENCOUNTER — Other Ambulatory Visit: Payer: Self-pay | Admitting: Cardiovascular Disease

## 2020-02-17 MED ORDER — TRANEXAMIC ACID 1000 MG/10ML IV SOLN
2000.0000 mg | INTRAVENOUS | Status: DC
  Filled 2020-02-17: qty 20

## 2020-02-17 MED ORDER — BUPIVACAINE LIPOSOME 1.3 % IJ SUSP
20.0000 mL | Freq: Once | INTRAMUSCULAR | Status: DC
  Filled 2020-02-17: qty 20

## 2020-02-18 ENCOUNTER — Encounter (HOSPITAL_COMMUNITY): Payer: Self-pay | Admitting: Orthopaedic Surgery

## 2020-02-18 ENCOUNTER — Other Ambulatory Visit: Payer: Self-pay

## 2020-02-18 ENCOUNTER — Ambulatory Visit (HOSPITAL_COMMUNITY): Payer: Medicare Other | Admitting: Certified Registered Nurse Anesthetist

## 2020-02-18 ENCOUNTER — Observation Stay (HOSPITAL_COMMUNITY)
Admission: RE | Admit: 2020-02-18 | Discharge: 2020-02-21 | Disposition: A | Discharge status: Home / Self Care | Payer: Medicare Other | Source: Ambulatory Visit | Attending: Orthopaedic Surgery | Admitting: Orthopaedic Surgery

## 2020-02-18 ENCOUNTER — Encounter (HOSPITAL_COMMUNITY)
Admission: RE | Disposition: A | Discharge status: Home / Self Care | Payer: Self-pay | Source: Ambulatory Visit | Attending: Orthopaedic Surgery

## 2020-02-18 ENCOUNTER — Ambulatory Visit (HOSPITAL_COMMUNITY): Payer: Medicare Other | Admitting: Emergency Medicine

## 2020-02-18 DIAGNOSIS — Z79899 Other long term (current) drug therapy: Secondary | ICD-10-CM | POA: Diagnosis not present

## 2020-02-18 DIAGNOSIS — I5042 Chronic combined systolic (congestive) and diastolic (congestive) heart failure: Secondary | ICD-10-CM | POA: Diagnosis not present

## 2020-02-18 DIAGNOSIS — M1711 Unilateral primary osteoarthritis, right knee: Secondary | ICD-10-CM | POA: Diagnosis not present

## 2020-02-18 DIAGNOSIS — I5041 Acute combined systolic (congestive) and diastolic (congestive) heart failure: Secondary | ICD-10-CM | POA: Diagnosis not present

## 2020-02-18 DIAGNOSIS — G8918 Other acute postprocedural pain: Secondary | ICD-10-CM | POA: Diagnosis not present

## 2020-02-18 DIAGNOSIS — I4891 Unspecified atrial fibrillation: Secondary | ICD-10-CM | POA: Insufficient documentation

## 2020-02-18 DIAGNOSIS — I110 Hypertensive heart disease with heart failure: Secondary | ICD-10-CM | POA: Diagnosis not present

## 2020-02-18 DIAGNOSIS — Z7901 Long term (current) use of anticoagulants: Secondary | ICD-10-CM | POA: Insufficient documentation

## 2020-02-18 HISTORY — PX: TOTAL KNEE ARTHROPLASTY: SHX125

## 2020-02-18 LAB — BASIC METABOLIC PANEL
Anion gap: 9 (ref 5–15)
BUN: 11 mg/dL (ref 8–23)
CO2: 23 mmol/L (ref 22–32)
Calcium: 9.1 mg/dL (ref 8.9–10.3)
Chloride: 102 mmol/L (ref 98–111)
Creatinine, Ser: 0.76 mg/dL (ref 0.44–1.00)
GFR, Estimated: >60 mL/min (ref >60)
Glucose, Bld: 128 mg/dL — ABNORMAL HIGH (ref 70–99)
Potassium: 3.7 mmol/L (ref 3.5–5.1)
Sodium: 134 mmol/L — ABNORMAL LOW (ref 135–145)

## 2020-02-18 LAB — PROTIME-INR
INR: 1.1 (ref 0.8–1.2)
Prothrombin Time: 13.3 seconds (ref 11.4–15.2)

## 2020-02-18 LAB — APTT: aPTT: 27 seconds (ref 24–36)

## 2020-02-18 LAB — TYPE AND SCREEN
ABO/RH(D): O NEG
Antibody Screen: NEGATIVE

## 2020-02-18 SURGERY — ARTHROPLASTY, KNEE, TOTAL
Anesthesia: Regional | Site: Knee | Laterality: Right

## 2020-02-18 MED ORDER — TRANEXAMIC ACID 1000 MG/10ML IV SOLN
INTRAVENOUS | Status: DC | PRN
  Administered 2020-02-18: 2000 mg via TOPICAL

## 2020-02-18 MED ORDER — SERTRALINE HCL 50 MG PO TABS
50.0000 mg | ORAL_TABLET | Freq: Every day | ORAL | Status: DC
  Administered 2020-02-18 – 2020-02-21 (×4): 50 mg via ORAL
  Filled 2020-02-18 (×4): qty 1

## 2020-02-18 MED ORDER — TRANEXAMIC ACID-NACL 1000-0.7 MG/100ML-% IV SOLN
1000.0000 mg | INTRAVENOUS | Status: AC
  Administered 2020-02-18: 1000 mg via INTRAVENOUS
  Filled 2020-02-18: qty 100

## 2020-02-18 MED ORDER — BUPIVACAINE-EPINEPHRINE (PF) 0.25% -1:200000 IJ SOLN
INTRAMUSCULAR | Status: AC
  Filled 2020-02-18: qty 30

## 2020-02-18 MED ORDER — BUPIVACAINE-EPINEPHRINE 0.5% -1:200000 IJ SOLN
INTRAMUSCULAR | Status: DC | PRN
  Administered 2020-02-18: 30 mL

## 2020-02-18 MED ORDER — PHENYLEPHRINE HCL (PRESSORS) 10 MG/ML IV SOLN
INTRAVENOUS | Status: AC
  Filled 2020-02-18: qty 1

## 2020-02-18 MED ORDER — PHENYLEPHRINE 40 MCG/ML (10ML) SYRINGE FOR IV PUSH (FOR BLOOD PRESSURE SUPPORT)
PREFILLED_SYRINGE | INTRAVENOUS | Status: DC | PRN
  Administered 2020-02-18 (×5): 80 ug via INTRAVENOUS

## 2020-02-18 MED ORDER — LIDOCAINE 2% (20 MG/ML) 5 ML SYRINGE
INTRAMUSCULAR | Status: AC
  Filled 2020-02-18: qty 5

## 2020-02-18 MED ORDER — METHOCARBAMOL 1000 MG/10ML IJ SOLN
500.0000 mg | Freq: Four times a day (QID) | INTRAVENOUS | Status: DC | PRN
  Filled 2020-02-18: qty 5

## 2020-02-18 MED ORDER — TRAMADOL HCL 50 MG PO TABS
50.0000 mg | ORAL_TABLET | Freq: Four times a day (QID) | ORAL | Status: DC
  Administered 2020-02-18 – 2020-02-21 (×11): 50 mg via ORAL
  Filled 2020-02-18 (×11): qty 1

## 2020-02-18 MED ORDER — METHOCARBAMOL 500 MG PO TABS
500.0000 mg | ORAL_TABLET | Freq: Four times a day (QID) | ORAL | Status: DC | PRN
  Administered 2020-02-18 – 2020-02-21 (×5): 500 mg via ORAL
  Filled 2020-02-18 (×5): qty 1

## 2020-02-18 MED ORDER — PHENYLEPHRINE HCL-NACL 10-0.9 MG/250ML-% IV SOLN
INTRAVENOUS | Status: DC | PRN
  Administered 2020-02-18: 20 ug/min via INTRAVENOUS

## 2020-02-18 MED ORDER — HYDROMORPHONE HCL 1 MG/ML IJ SOLN
INTRAMUSCULAR | Status: AC
  Filled 2020-02-18: qty 1

## 2020-02-18 MED ORDER — ACETAMINOPHEN 10 MG/ML IV SOLN
1000.0000 mg | Freq: Once | INTRAVENOUS | Status: DC | PRN
  Administered 2020-02-18: 1000 mg via INTRAVENOUS

## 2020-02-18 MED ORDER — KETOROLAC TROMETHAMINE 15 MG/ML IJ SOLN
7.5000 mg | Freq: Four times a day (QID) | INTRAMUSCULAR | Status: AC
  Administered 2020-02-18 – 2020-02-19 (×4): 7.5 mg via INTRAVENOUS
  Filled 2020-02-18 (×4): qty 1

## 2020-02-18 MED ORDER — ONDANSETRON HCL 4 MG/2ML IJ SOLN
INTRAMUSCULAR | Status: DC | PRN
  Administered 2020-02-18: 4 mg via INTRAVENOUS

## 2020-02-18 MED ORDER — CEFAZOLIN SODIUM-DEXTROSE 2-4 GM/100ML-% IV SOLN
2.0000 g | Freq: Four times a day (QID) | INTRAVENOUS | Status: AC
  Administered 2020-02-18 – 2020-02-19 (×2): 2 g via INTRAVENOUS
  Filled 2020-02-18 (×2): qty 100

## 2020-02-18 MED ORDER — METOCLOPRAMIDE HCL 5 MG/ML IJ SOLN: 5.0000 mg | Freq: Three times a day (TID) | INTRAMUSCULAR | Status: DC | PRN

## 2020-02-18 MED ORDER — SODIUM CHLORIDE (PF) 0.9 % IJ SOLN
INTRAMUSCULAR | Status: AC
  Filled 2020-02-18: qty 50

## 2020-02-18 MED ORDER — SODIUM CHLORIDE 0.9 % IR SOLN
Status: DC | PRN
  Administered 2020-02-18: 2000 mL

## 2020-02-18 MED ORDER — LACTATED RINGERS IV SOLN: INTRAVENOUS | Status: DC

## 2020-02-18 MED ORDER — ACETAMINOPHEN 500 MG PO TABS
500.0000 mg | ORAL_TABLET | Freq: Four times a day (QID) | ORAL | Status: AC
  Administered 2020-02-19 (×4): 500 mg via ORAL
  Filled 2020-02-18 (×4): qty 1

## 2020-02-18 MED ORDER — BISACODYL 5 MG PO TBEC: 5.0000 mg | DELAYED_RELEASE_TABLET | Freq: Every day | ORAL | Status: DC | PRN

## 2020-02-18 MED ORDER — DEXAMETHASONE SODIUM PHOSPHATE 10 MG/ML IJ SOLN
INTRAMUSCULAR | Status: DC | PRN
  Administered 2020-02-18: 10 mg via INTRAVENOUS

## 2020-02-18 MED ORDER — PROPOFOL 10 MG/ML IV BOLUS
INTRAVENOUS | Status: AC
  Filled 2020-02-18: qty 20

## 2020-02-18 MED ORDER — DIGOXIN 125 MCG PO TABS
125.0000 ug | ORAL_TABLET | Freq: Every day | ORAL | Status: DC
  Administered 2020-02-19 – 2020-02-21 (×3): 125 ug via ORAL
  Filled 2020-02-18 (×3): qty 1

## 2020-02-18 MED ORDER — DOCUSATE SODIUM 100 MG PO CAPS
100.0000 mg | ORAL_CAPSULE | Freq: Two times a day (BID) | ORAL | Status: DC
  Administered 2020-02-19 – 2020-02-21 (×3): 100 mg via ORAL
  Filled 2020-02-18 (×6): qty 1

## 2020-02-18 MED ORDER — ONDANSETRON HCL 4 MG/2ML IJ SOLN: 4.0000 mg | Freq: Four times a day (QID) | INTRAMUSCULAR | Status: DC | PRN

## 2020-02-18 MED ORDER — HYDROCODONE-ACETAMINOPHEN 5-325 MG PO TABS
1.0000 | ORAL_TABLET | Freq: Four times a day (QID) | ORAL | Status: DC | PRN
  Administered 2020-02-19 – 2020-02-21 (×5): 1 via ORAL
  Filled 2020-02-18 (×6): qty 1

## 2020-02-18 MED ORDER — DEXAMETHASONE SODIUM PHOSPHATE 10 MG/ML IJ SOLN
INTRAMUSCULAR | Status: AC
  Filled 2020-02-18: qty 1

## 2020-02-18 MED ORDER — ORAL CARE MOUTH RINSE: 15.0000 mL | Freq: Once | OROMUCOSAL | Status: AC

## 2020-02-18 MED ORDER — POVIDONE-IODINE 10 % EX SWAB
2.0000 "application " | Freq: Once | CUTANEOUS | Status: AC
  Administered 2020-02-18: 2 via TOPICAL

## 2020-02-18 MED ORDER — ALUM & MAG HYDROXIDE-SIMETH 200-200-20 MG/5ML PO SUSP: 30.0000 mL | ORAL | Status: DC | PRN

## 2020-02-18 MED ORDER — FENTANYL CITRATE (PF) 100 MCG/2ML IJ SOLN
INTRAMUSCULAR | Status: AC
  Filled 2020-02-18: qty 2

## 2020-02-18 MED ORDER — BUPIVACAINE LIPOSOME 1.3 % IJ SUSP
INTRAMUSCULAR | Status: DC | PRN
  Administered 2020-02-18: 20 mL

## 2020-02-18 MED ORDER — POLYVINYL ALCOHOL 1.4 % OP SOLN
1.0000 [drp] | OPHTHALMIC | Status: DC | PRN
  Filled 2020-02-18: qty 15

## 2020-02-18 MED ORDER — BUPIVACAINE IN DEXTROSE 0.75-8.25 % IT SOLN
INTRATHECAL | Status: DC | PRN
  Administered 2020-02-18: 1.4 mL via INTRATHECAL

## 2020-02-18 MED ORDER — ACETAMINOPHEN 325 MG PO TABS
325.0000 mg | ORAL_TABLET | Freq: Four times a day (QID) | ORAL | Status: DC | PRN
  Administered 2020-02-18: 650 mg via ORAL
  Administered 2020-02-20: 325 mg via ORAL
  Administered 2020-02-20: 650 mg via ORAL
  Filled 2020-02-18 (×3): qty 2

## 2020-02-18 MED ORDER — FENTANYL CITRATE (PF) 100 MCG/2ML IJ SOLN
INTRAMUSCULAR | Status: DC | PRN
  Administered 2020-02-18 (×2): 50 ug via INTRAVENOUS

## 2020-02-18 MED ORDER — MENTHOL 3 MG MT LOZG: 1.0000 | LOZENGE | OROMUCOSAL | Status: DC | PRN

## 2020-02-18 MED ORDER — METOCLOPRAMIDE HCL 5 MG PO TABS: 5.0000 mg | ORAL_TABLET | Freq: Three times a day (TID) | ORAL | Status: DC | PRN

## 2020-02-18 MED ORDER — HYDROMORPHONE HCL 1 MG/ML IJ SOLN
0.2500 mg | INTRAMUSCULAR | Status: DC | PRN
  Administered 2020-02-18 (×2): 0.5 mg via INTRAVENOUS

## 2020-02-18 MED ORDER — APIXABAN 2.5 MG PO TABS
2.5000 mg | ORAL_TABLET | Freq: Two times a day (BID) | ORAL | Status: DC
  Administered 2020-02-19 – 2020-02-21 (×5): 2.5 mg via ORAL
  Filled 2020-02-18 (×5): qty 1

## 2020-02-18 MED ORDER — PHENYLEPHRINE 40 MCG/ML (10ML) SYRINGE FOR IV PUSH (FOR BLOOD PRESSURE SUPPORT)
PREFILLED_SYRINGE | INTRAVENOUS | Status: AC
  Filled 2020-02-18: qty 10

## 2020-02-18 MED ORDER — SODIUM CHLORIDE (PF) 0.9 % IJ SOLN
INTRAMUSCULAR | Status: DC | PRN
  Administered 2020-02-18: 30 mL

## 2020-02-18 MED ORDER — CEFAZOLIN SODIUM-DEXTROSE 2-4 GM/100ML-% IV SOLN
2.0000 g | INTRAVENOUS | Status: AC
  Administered 2020-02-18: 2 g via INTRAVENOUS
  Filled 2020-02-18: qty 100

## 2020-02-18 MED ORDER — STERILE WATER FOR IRRIGATION IR SOLN
Status: DC | PRN
  Administered 2020-02-18: 1000 mL

## 2020-02-18 MED ORDER — DIPHENHYDRAMINE HCL 12.5 MG/5ML PO ELIX: 12.5000 mg | ORAL_SOLUTION | ORAL | Status: DC | PRN

## 2020-02-18 MED ORDER — ONDANSETRON HCL 4 MG PO TABS
4.0000 mg | ORAL_TABLET | Freq: Four times a day (QID) | ORAL | Status: DC | PRN
  Administered 2020-02-20: 4 mg via ORAL
  Filled 2020-02-18: qty 1

## 2020-02-18 MED ORDER — ROPIVACAINE HCL 7.5 MG/ML IJ SOLN
INTRAMUSCULAR | Status: DC | PRN
  Administered 2020-02-18: 20 mL via PERINEURAL

## 2020-02-18 MED ORDER — ONDANSETRON HCL 4 MG/2ML IJ SOLN: 4.0000 mg | Freq: Once | INTRAMUSCULAR | Status: DC | PRN

## 2020-02-18 MED ORDER — POTASSIUM CHLORIDE CRYS ER 20 MEQ PO TBCR
20.0000 meq | EXTENDED_RELEASE_TABLET | Freq: Every day | ORAL | Status: DC
  Administered 2020-02-18 – 2020-02-21 (×4): 20 meq via ORAL
  Filled 2020-02-18 (×4): qty 1

## 2020-02-18 MED ORDER — ACETAMINOPHEN 10 MG/ML IV SOLN
INTRAVENOUS | Status: AC
  Filled 2020-02-18: qty 100

## 2020-02-18 MED ORDER — TRANEXAMIC ACID-NACL 1000-0.7 MG/100ML-% IV SOLN
1000.0000 mg | Freq: Once | INTRAVENOUS | Status: AC
  Administered 2020-02-18: 1000 mg via INTRAVENOUS
  Filled 2020-02-18: qty 100

## 2020-02-18 MED ORDER — METOPROLOL SUCCINATE ER 50 MG PO TB24
75.0000 mg | ORAL_TABLET | Freq: Every day | ORAL | Status: DC
  Administered 2020-02-19 – 2020-02-21 (×3): 75 mg via ORAL
  Filled 2020-02-18 (×3): qty 1

## 2020-02-18 MED ORDER — CHLORHEXIDINE GLUCONATE 0.12 % MT SOLN
15.0000 mL | Freq: Once | OROMUCOSAL | Status: AC
  Administered 2020-02-18: 15 mL via OROMUCOSAL

## 2020-02-18 MED ORDER — PROPOFOL 500 MG/50ML IV EMUL
INTRAVENOUS | Status: DC | PRN
  Administered 2020-02-18: 50 ug/kg/min via INTRAVENOUS

## 2020-02-18 MED ORDER — PHENOL 1.4 % MT LIQD: 1.0000 | OROMUCOSAL | Status: DC | PRN

## 2020-02-18 MED ORDER — ONDANSETRON HCL 4 MG/2ML IJ SOLN
INTRAMUSCULAR | Status: AC
  Filled 2020-02-18: qty 2

## 2020-02-18 MED ORDER — 0.9 % SODIUM CHLORIDE (POUR BTL) OPTIME
TOPICAL | Status: DC | PRN
  Administered 2020-02-18: 1000 mL

## 2020-02-18 SURGICAL SUPPLY — 52 items
ATTUNE PS FEM RT SZ 5 CEM KNEE (Femur) ×2 IMPLANT
ATTUNE PSRP INSR SZ5 8 KNEE (Insert) ×2 IMPLANT
BAG DECANTER FOR FLEXI CONT (MISCELLANEOUS) ×2 IMPLANT
BAG ZIPLOCK 12X15 (MISCELLANEOUS) ×2 IMPLANT
BASE TIBIA ATTUNE KNEE SYS SZ6 (Knees) ×1 IMPLANT
BLADE SAGITTAL 25.0X1.19X90 (BLADE) ×2 IMPLANT
BLADE SAW SGTL 11.0X1.19X90.0M (BLADE) ×2 IMPLANT
BNDG ELASTIC 6X10 VLCR STRL LF (GAUZE/BANDAGES/DRESSINGS) ×2 IMPLANT
BNDG ELASTIC 6X5.8 VLCR STR LF (GAUZE/BANDAGES/DRESSINGS) ×2 IMPLANT
BOOTIES KNEE HIGH SLOAN (MISCELLANEOUS) ×2 IMPLANT
BOWL SMART MIX CTS (DISPOSABLE) ×2 IMPLANT
CEMENT HV SMART SET (Cement) ×2 IMPLANT
COVER SURGICAL LIGHT HANDLE (MISCELLANEOUS) ×2 IMPLANT
COVER WAND RF STERILE (DRAPES) ×2 IMPLANT
CUFF TOURN SGL QUICK 34 (TOURNIQUET CUFF) ×2
CUFF TRNQT CYL 34X4.125X (TOURNIQUET CUFF) ×1 IMPLANT
DECANTER SPIKE VIAL GLASS SM (MISCELLANEOUS) ×4 IMPLANT
DRAPE ORTHO SPLIT 77X108 STRL (DRAPES)
DRAPE SHEET LG 3/4 BI-LAMINATE (DRAPES) ×2 IMPLANT
DRAPE SURG ORHT 6 SPLT 77X108 (DRAPES) IMPLANT
DRAPE TOP 10253 STERILE (DRAPES) ×2 IMPLANT
DRAPE U-SHAPE 47X51 STRL (DRAPES) ×2 IMPLANT
DRSG AQUACEL AG ADV 3.5X10 (GAUZE/BANDAGES/DRESSINGS) ×2 IMPLANT
DURAPREP 26ML APPLICATOR (WOUND CARE) ×4 IMPLANT
ELECT REM PT RETURN 15FT ADLT (MISCELLANEOUS) ×2 IMPLANT
GLOVE BIO SURGEON STRL SZ8 (GLOVE) ×4 IMPLANT
GLOVE BIOGEL PI IND STRL 8 (GLOVE) ×2 IMPLANT
GLOVE BIOGEL PI INDICATOR 8 (GLOVE) ×2
GOWN STRL REUS W/TWL XL LVL3 (GOWN DISPOSABLE) ×4 IMPLANT
HANDPIECE INTERPULSE COAX TIP (DISPOSABLE) ×2
HOLDER FOLEY CATH W/STRAP (MISCELLANEOUS) IMPLANT
HOOD PEEL AWAY FLYTE STAYCOOL (MISCELLANEOUS) ×6 IMPLANT
KIT TURNOVER KIT A (KITS) IMPLANT
MANIFOLD NEPTUNE II (INSTRUMENTS) ×2 IMPLANT
NS IRRIG 1000ML POUR BTL (IV SOLUTION) ×2 IMPLANT
PACK TOTAL KNEE CUSTOM (KITS) ×2 IMPLANT
PAD ARMBOARD 7.5X6 YLW CONV (MISCELLANEOUS) ×2 IMPLANT
PATELLA MEDIAL ATTUN 35MM KNEE (Knees) ×2 IMPLANT
PENCIL SMOKE EVACUATOR (MISCELLANEOUS) IMPLANT
PIN STEINMAN FIXATION KNEE (PIN) ×2 IMPLANT
PIN THREADED HEADED SIGMA (PIN) ×2 IMPLANT
PROTECTOR NERVE ULNAR (MISCELLANEOUS) ×2 IMPLANT
SET HNDPC FAN SPRY TIP SCT (DISPOSABLE) ×1 IMPLANT
SUT ETHIBOND NAB CT1 #1 30IN (SUTURE) ×4 IMPLANT
SUT VIC AB 0 CT1 36 (SUTURE) ×2 IMPLANT
SUT VIC AB 2-0 CT1 27 (SUTURE) ×2
SUT VIC AB 2-0 CT1 TAPERPNT 27 (SUTURE) ×1 IMPLANT
SUT VICRYL AB 3-0 FS1 BRD 27IN (SUTURE) ×2 IMPLANT
TIBIA ATTUNE KNEE SYS BASE SZ6 (Knees) ×2 IMPLANT
TRAY FOLEY MTR SLVR 16FR STAT (SET/KITS/TRAYS/PACK) IMPLANT
WATER STERILE IRR 1000ML POUR (IV SOLUTION) ×2 IMPLANT
WRAP KNEE MAXI GEL POST OP (GAUZE/BANDAGES/DRESSINGS) ×2 IMPLANT

## 2020-02-18 NOTE — Op Note (Signed)
PREOP DIAGNOSIS: DJD RIGHT KNEE POSTOP DIAGNOSIS: same PROCEDURE: RIGHT TKR ANESTHESIA: Spinal and MAC ATTENDING SURGEON: Hessie Dibble ASSISTANT: Loni Dolly PA  INDICATIONS FOR PROCEDURE: [name] is a 86 y.o. female who has struggled for a long time with pain due to degenerative arthritis of the right knee.  The patient has failed many conservative non-operative measures and at this point has pain which limits the ability to sleep and walk.  The patient is offered total knee replacement.  Informed operative consent was obtained after discussion of possible risks of anesthesia, infection, neurovascular injury, DVT, and death.  The importance of the post-operative rehabilitation protocol to optimize result was stressed extensively with the patient.  SUMMARY OF FINDINGS AND PROCEDURE:  [name] was taken to the operative suite where under the above anesthesia a right knee replacement was performed.  There were advanced degenerative changes and the bone quality was poor.  We used the DePuy Attune system and placed size 5 femur, 6 tibia, 35 mm all polyethylene patella, and a size 8 mm spacer. We set her up a bit tight intentionally.  Loni Dolly PA-C assisted throughout and was invaluable to the completion of the case in that he helped retract and maintain exposure while I placed components.  He also helped close thereby minimizing OR time.  The patient was admitted for appropriate post-op care to include perioperative antibiotics and mechanical and pharmacologic measures for DVT prophylaxis.  DESCRIPTION OF PROCEDURE:  [name] was taken to the operative suite where the above anesthesia was applied.  The patient was positioned supine and prepped and draped in normal sterile fashion.  An appropriate time out was performed.  After the administration of kefzol pre-op antibiotic the leg was elevated and exsanguinated and a tourniquet inflated. A standard longitudinal incision was made  on the anterior knee.  Dissection was carried down to the extensor mechanism.  All appropriate anti-infective measures were used including the pre-operative antibiotic, betadine impregnated drape, and closed hooded exhaust systems for each member of the surgical team.  A medial parapatellar incision was made in the extensor mechanism and the knee cap flipped and the knee flexed.  Some residual meniscal tissues were removed along with any remaining ACL/PCL tissue.  A guide was placed on the tibia and a flat cut was made on it's superior surface.  An intramedullary guide was placed in the femur and was utilized to make anterior and posterior cuts creating an appropriate flexion gap.  A second intramedullary guide was placed in the femur to make a distal cut properly balancing the knee with an extension gap equal to the flexion gap.  The three bones sized to the above mentioned sizes and the appropriate guides were placed and utilized.  A trial reduction was done and the knee easily came to full extension and the patella tracked well on flexion.  The trial components were removed and all bones were cleaned with pulsatile lavage and then dried thoroughly.  Cement was mixed and was pressurized onto the bones followed by placement of the aforementioned components.  Excess cement was trimmed and pressure was held on the components until the cement had hardened.  The tourniquet was deflated and a small amount of bleeding was controlled with cautery and pressure.  The knee was irrigated thoroughly.  The extensor mechanism was re-approximated with #1 ethibond in interrupted fashion.  The knee was flexed and the repair was solid.  The subcutaneous tissues were re-approximated with #0  and #2-0 vicryl and the skin closed with a subcuticular stitch and steristrips.  A sterile dressing was applied.  Intraoperative fluids, EBL, and tourniquet time can be obtained from anesthesia records.  DISPOSITION:  The patient was taken to  recovery room in stable condition and admitted for appropriate post-op care to include peri-operative antibiotic and DVT prophylaxis with mechanical and pharmacologic measures.  Hessie Dibble 02/18/2020, 9:14 AM

## 2020-02-18 NOTE — Anesthesia Postprocedure Evaluation (Signed)
Anesthesia Post Note  Patient: [name]  Procedure(s) Performed: RIGHT TOTAL KNEE ARTHROPLASTY (Right Knee)     Patient location during evaluation: PACU Anesthesia Type: Regional, MAC and Spinal Level of consciousness: oriented and awake and alert Pain management: pain level controlled Vital Signs Assessment: post-procedure vital signs reviewed and stable Respiratory status: spontaneous breathing and respiratory function stable Cardiovascular status: blood pressure returned to baseline and stable Postop Assessment: no headache, no backache, no apparent nausea or vomiting and patient able to bend at knees Anesthetic complications: no   No complications documented.  Last Vitals:  Vitals:   02/18/20 1406 02/18/20 1732  BP: 121/70 (!) 156/78  Pulse: 69 68  Resp: 18 14  Temp: 36.5 C 36.5 C  SpO2: 96% 100%    Last Pain:  Vitals:   02/18/20 1817  TempSrc:   PainSc: 4                  Chrisopher Pustejovsky P Creed Kail

## 2020-02-18 NOTE — Interval H&P Note (Signed)
History and Physical Interval Note:  02/18/2020 7:24 AM  [name]  has presented today for surgery, with the diagnosis of RIGHT KNEE DEGENERATIVE JOINT DISEASE.  The various methods of treatment have been discussed with the patient and family. After consideration of risks, benefits and other options for treatment, the patient has consented to  Procedure(s): RIGHT TOTAL KNEE ARTHROPLASTY (Right) as a surgical intervention.  The patient's history has been reviewed, patient examined, no change in status, stable for surgery.  I have reviewed the patient's chart and labs.  Questions were answered to the patient's satisfaction.     [name]

## 2020-02-18 NOTE — Evaluation (Signed)
Physical Therapy Evaluation Patient Details Name: [name] MRN: [id number] DOB: 1930-07-25 Today's Date: 02/18/2020   History of Present Illness  86 yo female s/p R TKA 02/18/2020. 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 She walked ~30 feet with a RW. Pt rated pain 8/10. Ambulation distance was limited by pain. Assisted pt back to bed at her request. Will plan to follow and progress activity as tolerated.     Follow Up Recommendations Follow surgeon's recommendation for DC plan and follow-up therapies (plan is for HHPT then OP PT)    Equipment Recommendations  None recommended by PT    Recommendations for Other Services       Precautions / Restrictions Precautions Precautions: Fall;Knee Restrictions Weight Bearing Restrictions: No Other Position/Activity Restrictions: WBAT      Mobility  Bed Mobility Overal bed mobility: Needs Assistance Bed Mobility: Supine to Sit;Sit to Supine     Supine to sit: Min assist;HOB elevated Sit to supine: Min assist;HOB elevated   General bed mobility comments: Assist for LEs. Increased time.    Transfers Overall transfer level: Needs assistance Equipment used: Rolling walker (2 wheeled) Transfers: Sit to/from Stand Sit to Stand: Min assist;From elevated surface         General transfer comment: VCs safety, technique, hand placement. Assist to power up, stabilize, control descent.  Ambulation/Gait Ambulation/Gait assistance: Min assist Gait Distance (Feet): 30 Feet Assistive device: Rolling walker (2 wheeled) Gait Pattern/deviations: Step-to pattern     General Gait Details: VCs safety, technique, sequence. Assist to steady throughout distance. Distance limited by pain. Pt denied dizziness.  Stairs            Wheelchair Mobility    Modified Rankin (Stroke Patients Only)       Balance Overall balance assessment: Needs  assistance         Standing balance support: Bilateral upper extremity supported Standing balance-Leahy Scale: Poor                               Pertinent Vitals/Pain Pain Assessment: 0-10 Pain Score: 8  Pain Location: R knee Pain Descriptors / Indicators: Discomfort;Grimacing;Guarding;Aching;Sore Pain Intervention(s): Limited activity within patient's tolerance;Monitored during session;Repositioned;Ice applied    Home Living Family/patient expects to be discharged to:: Private residence Living Arrangements: Spouse/significant other Available Help at Discharge: Family;Available 24 hours/day Type of Home: House Home Access: Level entry     Home Layout: Able to live on main level with bedroom/bathroom;Two level Home Equipment: Walker - 2 wheels;Shower seat;Bedside commode;Cane - single point;Grab bars - tub/shower;Grab bars - toilet      Prior Function Level of Independence: Independent with assistive device(s)         Comments: used RW and cane PRN     Hand Dominance        Extremity/Trunk Assessment   Upper Extremity Assessment Upper Extremity Assessment: Overall WFL for tasks assessed    Lower Extremity Assessment Lower Extremity Assessment: Generalized weakness;RLE deficits/detail;LLE deficits/detail RLE Deficits / Details: moves ankle well. SLR 3-/5 RLE: Unable to fully assess due to pain LLE Deficits / Details: Pt reports residual hip flexor and quad weakness after hip surgery in 07/2019. Hip flex 2/5.    Cervical / Trunk Assessment Cervical / Trunk Assessment: Normal  Communication   Communication: No difficulties  Cognition Arousal/Alertness: Awake/alert Behavior During Therapy: [facility]  for tasks assessed/performed Overall Cognitive Status: Within Functional Limits for tasks assessed                                        General Comments      Exercises     Assessment/Plan    PT Assessment Patient needs continued  PT services  PT Problem List Decreased strength;Decreased mobility;Decreased range of motion;Decreased activity tolerance;Decreased balance;Decreased knowledge of use of DME;Pain       PT Treatment Interventions DME instruction;Gait training;Therapeutic activities;Patient/family education;Therapeutic exercise;Balance training;Functional mobility training    PT Goals (Current goals can be found in the Care Plan section)  Acute Rehab PT Goals Patient Stated Goal: regain strength in LEs. PT Goal Formulation: With patient Time For Goal Achievement: 03/03/20 Potential to Achieve Goals: Good    Frequency 7X/week   Barriers to discharge        Co-evaluation               AM-PAC PT "6 Clicks" Mobility  Outcome Measure Help needed turning from your back to your side while in a flat bed without using bedrails?: A Little Help needed moving from lying on your back to sitting on the side of a flat bed without using bedrails?: A Little Help needed moving to and from a bed to a chair (including a wheelchair)?: A Little Help needed standing up from a chair using your arms (e.g., wheelchair or bedside chair)?: A Little Help needed to walk in hospital room?: A Little Help needed climbing 3-5 steps with a railing? : A Lot 6 Click Score: 17    End of Session Equipment Utilized During Treatment: Gait belt Activity Tolerance: Patient limited by pain Patient left: in bed;with call bell/phone within reach;with family/visitor present   PT Visit Diagnosis: Pain;Other abnormalities of gait and mobility (R26.89) Pain - Right/Left: Right Pain - part of body: Knee    Time: 4503-8882 PT Time Calculation (min) (ACUTE ONLY): 27 min   Charges:   PT Evaluation $PT Eval Low Complexity: 1 Low PT Treatments $Gait Training: 8-22 mins           Faye Ramsay, PT Acute Rehabilitation  Office: [phone] Pager: [phone]

## 2020-02-18 NOTE — Transfer of Care (Signed)
Immediate Anesthesia Transfer of Care Note  Patient: [name]  Procedure(s) Performed: RIGHT TOTAL KNEE ARTHROPLASTY (Right Knee)  Patient Location: PACU  Anesthesia Type:Spinal and MAC combined with regional for post-op pain  Level of Consciousness: awake, alert  and oriented  Airway & Oxygen Therapy: Patient Spontanous Breathing and Patient connected to face mask oxygen  Post-op Assessment: Report given to RN and Post -op Vital signs reviewed and stable  Post vital signs: Reviewed and stable  Last Vitals:  Vitals Value Taken Time  BP 106/59 02/18/20 0934  Temp    Pulse 91 02/18/20 0935  Resp 13 02/18/20 0935  SpO2 100 % 02/18/20 0935  Vitals shown include unvalidated device data.  Last Pain:  Vitals:   02/18/20 0616  TempSrc: Oral         Complications: No complications documented.

## 2020-02-18 NOTE — Anesthesia Procedure Notes (Signed)
Anesthesia Regional Block: Adductor canal block   Pre-Anesthetic Checklist: ,, timeout performed, Correct Patient, Correct Site, Correct Laterality, Correct Procedure, Correct Position, site marked, Risks and benefits discussed,  Surgical consent,  Pre-op evaluation,  At surgeon's request and post-op pain management  Laterality: Right  Prep: Dura Prep       Needles:  Injection technique: Single-shot  Needle Type: Echogenic Stimulator Needle     Needle Length: 9cm  Needle Gauge: 20     Additional Needles:   Procedures:,,,, ultrasound used (permanent image in chart),,,,  Narrative:  Start time: 02/18/2020 7:02 AM End time: 02/18/2020 7:06 AM Injection made incrementally with aspirations every 5 mL.  Performed by: Personally  Anesthesiologist: Atilano Median, DO  Additional Notes: Patient identified. Risks/Benefits/Options discussed with patient including but not limited to bleeding, infection, nerve damage, failed block, incomplete pain control. Patient expressed understanding and wished to proceed. All questions were answered. Sterile technique was used throughout the entire procedure. Please see nursing notes for vital signs. Aspirated in 5cc intervals with injection for negative confirmation. Patient was given instructions on fall risk and not to get out of bed. All questions and concerns addressed with instructions to call with any issues or inadequate analgesia.

## 2020-02-18 NOTE — Plan of Care (Signed)

## 2020-02-18 NOTE — Plan of Care (Signed)
  Problem: Education: Goal: Knowledge of General Education information will improve Description: Including pain rating scale, medication(s)/side effects and non-pharmacologic comfort measures Outcome: Progressing   Problem: Activity: Goal: Risk for activity intolerance will decrease Outcome: Progressing   Problem: Nutrition: Goal: Adequate nutrition will be maintained Outcome: Progressing   Problem: Coping: Goal: Level of anxiety will decrease Outcome: Progressing   Problem: Elimination: Goal: Will not experience complications related to bowel motility Outcome: Progressing   Problem: Pain Managment: Goal: General experience of comfort will improve Outcome: Progressing   Problem: Safety: Goal: Ability to remain free from injury will improve Outcome: Progressing   Problem: Education: Goal: Knowledge of the prescribed therapeutic regimen will improve Outcome: Progressing   Problem: Activity: Goal: Ability to avoid complications of mobility impairment will improve Outcome: Progressing   Problem: Clinical Measurements: Goal: Postoperative complications will be avoided or minimized Outcome: Progressing   Problem: Pain Management: Goal: Pain level will decrease with appropriate interventions Outcome: Progressing

## 2020-02-18 NOTE — Care Plan (Signed)
Ortho Bundle Case Management Note  Patient Details  Name: [name] MRN: [id number] Date of Birth: January 01, 1931      Patient set up with post op care from the office. She will discharge to home with family. Has equipment at home. HHPT referral to [facility] and OPPT will be set up at [facility] st.  Patient and MD in agreement with plan and choice was offered                DME Arranged:    DME Agency:     HH Arranged:  PT HH Agency:  [facility] ([facility])  Additional Comments: Please contact me with any questions of if this plan should need to change.  Shauna Hugh,  RN,BSN,MHA,CCM  [facility] Orthopaedic Specialist  [phone] 02/18/2020, 11:08 AM

## 2020-02-18 NOTE — Anesthesia Procedure Notes (Signed)
Spinal  Patient location during procedure: OR Start time: 02/18/2020 7:36 AM End time: 02/18/2020 7:40 AM Staffing Performed: resident/CRNA  Anesthesiologist: Atilano Median, DO Resident/CRNA: Pearson Grippe, CRNA Preanesthetic Checklist Completed: patient identified, IV checked, site marked, risks and benefits discussed, surgical consent, monitors and equipment checked, pre-op evaluation and timeout performed Spinal Block Patient position: sitting Prep: DuraPrep Patient monitoring: heart rate, cardiac monitor, continuous pulse ox and blood pressure Approach: midline Location: L3-4 Injection technique: single-shot Needle Needle type: Sprotte  Needle gauge: 24 G Needle length: 9 cm Assessment Sensory level: T4 Additional Notes Consent/allergies verified. Pt sitting for spinal. Spinal attempt x1, + clear, free-flowing CSF. Easy to aspirate/inject. - paresthesia. Level to T6.

## 2020-02-19 ENCOUNTER — Encounter (HOSPITAL_COMMUNITY): Payer: Self-pay | Admitting: Orthopaedic Surgery

## 2020-02-19 DIAGNOSIS — M1711 Unilateral primary osteoarthritis, right knee: Secondary | ICD-10-CM | POA: Diagnosis not present

## 2020-02-19 DIAGNOSIS — I5041 Acute combined systolic (congestive) and diastolic (congestive) heart failure: Secondary | ICD-10-CM | POA: Diagnosis not present

## 2020-02-19 DIAGNOSIS — I4891 Unspecified atrial fibrillation: Secondary | ICD-10-CM | POA: Diagnosis not present

## 2020-02-19 DIAGNOSIS — Z7901 Long term (current) use of anticoagulants: Secondary | ICD-10-CM | POA: Diagnosis not present

## 2020-02-19 DIAGNOSIS — Z79899 Other long term (current) drug therapy: Secondary | ICD-10-CM | POA: Diagnosis not present

## 2020-02-19 LAB — CBC
HCT: 35.4 % — ABNORMAL LOW (ref 36.0–46.0)
Hemoglobin: 11.5 g/dL — ABNORMAL LOW (ref 12.0–15.0)
MCH: 31.3 pg (ref 26.0–34.0)
MCHC: 32.5 g/dL (ref 30.0–36.0)
MCV: 96.2 fL (ref 80.0–100.0)
Platelets: 264 10*3/uL (ref 150–400)
RBC: 3.68 MIL/uL — ABNORMAL LOW (ref 3.87–5.11)
RDW: 13 % (ref 11.5–15.5)
WBC: 12.1 10*3/uL — ABNORMAL HIGH (ref 4.0–10.5)
nRBC: 0 % (ref 0.0–0.2)

## 2020-02-19 LAB — BASIC METABOLIC PANEL
Anion gap: 6 (ref 5–15)
BUN: 17 mg/dL (ref 8–23)
CO2: 26 mmol/L (ref 22–32)
Calcium: 8.7 mg/dL — ABNORMAL LOW (ref 8.9–10.3)
Chloride: 100 mmol/L (ref 98–111)
Creatinine, Ser: 0.81 mg/dL (ref 0.44–1.00)
GFR, Estimated: >60 mL/min (ref >60)
Glucose, Bld: 150 mg/dL — ABNORMAL HIGH (ref 70–99)
Potassium: 4.5 mmol/L (ref 3.5–5.1)
Sodium: 132 mmol/L — ABNORMAL LOW (ref 135–145)

## 2020-02-19 MED ORDER — TIZANIDINE HCL 2 MG PO CAPS: 2.0000 mg | ORAL_CAPSULE | Freq: Four times a day (QID) | ORAL | 1 refills | Status: DC | PRN

## 2020-02-19 MED ORDER — TRAMADOL HCL 50 MG PO TABS
50.0000 mg | ORAL_TABLET | Freq: Four times a day (QID) | ORAL | 0 refills | Status: DC | PRN
Start: 2020-02-19 — End: 2020-06-17

## 2020-02-19 NOTE — Progress Notes (Signed)
Physical Therapy Treatment Patient Details Name: [name] MRN: [id number] DOB: 1930/04/15 Today's Date: 02/19/2020    History of Present Illness 86 yo female s/p R TKA 02/18/2020. 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 Pt continues to c/o significant thigh pain with exercises and ambulation. Applied KI for ambulation for safety-pt denied knee buckling this session. However, she has more trouble rising from sitting surface with KI in place. Encouraged pt to continue to wear KI for ambulation safety (anytime she is OOB) until HHPT instructs her otherwise. Will continue to follow and progress activity as tolerated. Plan is for possible d/c home tomorrow if pt meets goals.     Follow Up Recommendations  Follow surgeon's recommendation for DC plan and follow-up therapies;Supervision for mobility/OOB (plan is for HHPT then OP PT)     Equipment Recommendations  None recommended by PT    Recommendations for Other Services       Precautions / Restrictions Precautions Precautions: Fall;Knee Required Braces or Orthoses: Knee Immobilizer - Right Knee Immobilizer - Right: On when out of bed or walking Restrictions Weight Bearing Restrictions: No Other Position/Activity Restrictions: WBAT    Mobility  Bed Mobility Overal bed mobility: Needs Assistance Bed Mobility: Supine to Sit;Sit to Supine     Supine to sit: Min guard;HOB elevated Sit to supine: Min guard;HOB elevated   General bed mobility comments: Increased time. Pt uses UEs to assist LEs on/off bed.  Transfers Overall transfer level: Needs assistance Equipment used: Rolling walker (2 wheeled) Transfers: Sit to/from Stand Sit to Stand: Min assist;From elevated surface         General transfer comment: VCs safety, technique, hand placement. Assist to power up, stabilize, control descent. Increased assist to rise with KI in place (residual weakness of L LE from  recent prior surgeries)  Ambulation/Gait Ambulation/Gait assistance: Min assist Gait Distance (Feet): 75 Feet Assistive device: Rolling walker (2 wheeled) Gait Pattern/deviations: Step-to pattern;Decreased stance time - right;Antalgic     General Gait Details: Cues for safety, technique, sequence, RW proximity, safe pacing. Pt continues to c/o thigh pain with ambulation. She did not feel like the knee wants to buckle with KI in place. Fatigues fairly quickly with ambulation.   Stairs             Wheelchair Mobility    Modified Rankin (Stroke Patients Only)       Balance Overall balance assessment: Needs assistance         Standing balance support: Bilateral upper extremity supported Standing balance-Leahy Scale: Poor                              Cognition Arousal/Alertness: Awake/alert Behavior During Therapy: WFL for tasks assessed/performed Overall Cognitive Status: Within Functional Limits for tasks assessed                                        Exercises Total Joint Exercises Quad Sets: AROM;Right;10 reps Short Arc QuadBarbaraann Eaton;Right;5 reps Straight Leg Raises: AAROM;Right;5 reps    General Comments        Pertinent Vitals/Pain Pain Assessment: 0-10 Pain Score: 7  Pain Location: R thigh Pain Descriptors / Indicators: Discomfort;Grimacing;Guarding;Aching;Sore Pain Intervention(s): Monitored during session;Ice applied;Repositioned;Heat applied    Home Living  Prior Function            PT Goals (current goals can now be found in the care plan section) Progress towards PT goals: Progressing toward goals    Frequency    7X/week      PT Plan Current plan remains appropriate    Co-evaluation              AM-PAC PT "6 Clicks" Mobility   Outcome Measure  Help needed turning from your back to your side while in a flat bed without using bedrails?: A Little Help needed moving  from lying on your back to sitting on the side of a flat bed without using bedrails?: A Little Help needed moving to and from a bed to a chair (including a wheelchair)?: A Little Help needed standing up from a chair using your arms (e.g., wheelchair or bedside chair)?: A Little Help needed to walk in hospital room?: A Little Help needed climbing 3-5 steps with a railing? : A Lot 6 Click Score: 17    End of Session Equipment Utilized During Treatment: Gait belt Activity Tolerance: Patient tolerated treatment well Patient left: in bed;with call bell/phone within reach;with family/visitor present   PT Visit Diagnosis: Pain;Other abnormalities of gait and mobility (R26.89) Pain - Right/Left: Right Pain - part of body: Knee (thigh)     Time: 1610-9604 PT Time Calculation (min) (ACUTE ONLY): 32 min  Charges:  $Gait Training: 8-22 mins $Therapeutic Exercise: 8-22 mins                        Faye Ramsay, PT Acute Rehabilitation  Office: [phone] Pager: [phone]

## 2020-02-19 NOTE — Plan of Care (Signed)
°  Problem: Education: °Goal: Knowledge of General Education information will improve °Description: Including pain rating scale, medication(s)/side effects and non-pharmacologic comfort measures °Outcome: Progressing °  °Problem: Health Behavior/Discharge Planning: °Goal: Ability to manage health-related needs will improve °Outcome: Progressing °  °Problem: Clinical Measurements: °Goal: Ability to maintain clinical measurements within normal limits will improve °Outcome: Progressing °Goal: Will remain free from infection °Outcome: Progressing °Goal: Diagnostic test results will improve °Outcome: Progressing °Goal: Respiratory complications will improve °Outcome: Progressing °Goal: Cardiovascular complication will be avoided °Outcome: Progressing °  °Problem: Activity: °Goal: Risk for activity intolerance will decrease °Outcome: Progressing °  °Problem: Nutrition: °Goal: Adequate nutrition will be maintained °Outcome: Progressing °  °Problem: Coping: °Goal: Level of anxiety will decrease °Outcome: Progressing °  °Problem: Elimination: °Goal: Will not experience complications related to bowel motility °Outcome: Progressing °Goal: Will not experience complications related to urinary retention °Outcome: Progressing °  °Problem: Pain Managment: °Goal: General experience of comfort will improve °Outcome: Progressing °  °Problem: Safety: °Goal: Ability to remain free from injury will improve °Outcome: Progressing °  °Problem: Education: °Goal: Knowledge of the prescribed therapeutic regimen will improve °Outcome: Progressing °Goal: Individualized Educational Video(s) °Outcome: Progressing °  °Problem: Activity: °Goal: Ability to avoid complications of mobility impairment will improve °Outcome: Progressing °Goal: Range of joint motion will improve °Outcome: Progressing °  °Problem: Clinical Measurements: °Goal: Postoperative complications will be avoided or minimized °Outcome: Progressing °  °Problem: Pain Management: °Goal:  Pain level will decrease with appropriate interventions °Outcome: Progressing °  °

## 2020-02-19 NOTE — Progress Notes (Signed)
Subjective: 1 Day Post-Op Procedure(s) (LRB): RIGHT TOTAL KNEE ARTHROPLASTY (Right) patinet resting comfortably this morning in no pain. She is asking about restarting her eliquis and is hoping to go home today.  Activity level:  wbat Diet tolerance:  ok Voiding:  ok Patient reports pain as mild.    Objective: Vital signs in last 24 hours: Temp:  [97.7 F (36.5 C)-98.2 F (36.8 C)] 98.2 F (36.8 C) (11/10 0605) Pulse Rate:  [66-109] 85 (11/10 0605) Resp:  [13-18] 15 (11/10 0605) BP: (112-156)/(59-81) 126/74 (11/10 0605) SpO2:  [96 %-100 %] 96 % (11/10 0605) Weight:  [70.5 kg] 70.5 kg (11/09 1415)  Labs: Recent Labs    02/19/20 0329  HGB 11.5*   Recent Labs    02/19/20 0329  WBC 12.1*  RBC 3.68*  HCT 35.4*  PLT 264   Recent Labs    02/18/20 0607 02/19/20 0329  NA 134* 132*  K 3.7 4.5  CL 102 100  CO2 23 26  BUN 11 17  CREATININE 0.76 0.81  GLUCOSE 128* 150*  CALCIUM 9.1 8.7*   Recent Labs    02/18/20 0607  INR 1.1    Physical Exam:  Neurologically intact ABD soft Neurovascular intact Sensation intact distally Intact pulses distally Dorsiflexion/Plantar flexion intact Incision: dressing C/D/I No cellulitis present Compartment soft  Assessment/Plan:  1 Day Post-Op Procedure(s) (LRB): RIGHT TOTAL KNEE ARTHROPLASTY (Right) Advance diet Up with therapy D/C IV fluids Discharge home with home health today after PT if cleared and doing well. Resume home eliquis this morning. Follow up in office 2 weeks post op. Tramadol and tizanidine for pain.  Ginger Organ Torianna Junio 02/19/2020, 8:08 AM

## 2020-02-19 NOTE — TOC Transition Note (Signed)
Transition of Care [facility]) - CM/SW Discharge Note   Patient Details  Name: TERASA ORSINI MRN: [id number] Date of Birth: July 20, 1930  [facility] CM/SW Contact:  Clearance Coots, LCSW Phone Number: 02/19/2020, 12:21 PM   Clinical Narrative:    Elmon Else plan of care, see Ortho CM note.   Final next level of care: Home w Home Health Services (the OPPT)     Patient Goals and CMS Choice        Discharge Placement                       Discharge Plan and Services                          HH Arranged: PT HH Agency: [facility] ([facility])        Social Determinants of Health (SDOH) Interventions     Readmission Risk Interventions Readmission Risk Prevention Plan 08/06/2019  Transportation Screening Complete  PCP or Specialist Appt within 5-7 Days Complete  Home Care Screening Complete  Medication Review (RN CM) Complete  Some recent data might be hidden

## 2020-02-19 NOTE — Progress Notes (Signed)
Physical Therapy Treatment Patient Details Name: [name] MRN: [id number] DOB: 1930/07/25 Today's Date: 02/19/2020    History of Present Illness 86 yo female s/p R TKA 02/18/2020. 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 Pt reported 8/10 thigh pain during session on today. She reported she felt a pop in knee during SLR exercises (I also heard it). She was able to increase the ambulation distance on today but she began to have intermittent knee buckling, unsteadiness, and loss of balance after ~50 feet. Will plan to have a 2nd session to continue to assess safety and to work towards PT goals. Spoke with RN and requested a knee immobilizer for ambulation safety.     Follow Up Recommendations  Follow surgeon's recommendation for DC plan and follow-up therapies (plan is for HHPT then OP PT)     Equipment Recommendations  None recommended by PT    Recommendations for Other Services       Precautions / Restrictions Precautions Precautions: Fall;Knee Restrictions Weight Bearing Restrictions: No Other Position/Activity Restrictions: WBAT    Mobility  Bed Mobility Overal bed mobility: Needs Assistance Bed Mobility: Supine to Sit     Supine to sit: Min assist     General bed mobility comments: Assist for LEs. Increased time.  Transfers Overall transfer level: Needs assistance Equipment used: Rolling walker (2 wheeled) Transfers: Sit to/from Stand Sit to Stand: Min assist;From elevated surface         General transfer comment: VCs safety, technique, hand placement. Assist to power up, stabilize, control descent.  Ambulation/Gait Ambulation/Gait assistance: Min assist Gait Distance (Feet): 75 Feet Assistive device: Rolling walker (2 wheeled) Gait Pattern/deviations: Step-to pattern;Decreased stance time - right;Antalgic     General Gait Details: Cues for safety, technique, sequence, increased R knee extension  during WBing, posture, RW proximity. Pt reported thigh felt better after ~20 feet. However, after about 50 feet pt began to have intermittent knee buckling and increased unsteadiness.LOB x 2 requiring Min assist from therapist to prevent fall.   Stairs             Wheelchair Mobility    Modified Rankin (Stroke Patients Only)       Balance Overall balance assessment: Needs assistance         Standing balance support: Bilateral upper extremity supported Standing balance-Leahy Scale: Poor                              Cognition Arousal/Alertness: Awake/alert Behavior During Therapy: WFL for tasks assessed/performed Overall Cognitive Status: Within Functional Limits for tasks assessed                                        Exercises Total Joint Exercises Ankle Circles/Pumps: AROM;Both;10 reps Quad Sets: AROM;Both;10 reps Heel Slides: AROM;AAROM;Right;10 reps Hip ABduction/ADduction: AROM;Right;10 reps;AAROM Straight Leg Raises: AAROM;Right;10 reps Goniometric ROM: ~10-75 degrees    General Comments        Pertinent Vitals/Pain Pain Assessment: 0-10 Pain Score: 8  Pain Location: R thigh Pain Descriptors / Indicators: Discomfort;Grimacing;Guarding;Aching;Sore Pain Intervention(s): Limited activity within patient's tolerance;Monitored during session;Repositioned;Ice applied    Home Living                      Prior Function  PT Goals (current goals can now be found in the care plan section) Progress towards PT goals: Progressing toward goals    Frequency    7X/week      PT Plan Current plan remains appropriate    Co-evaluation              AM-PAC PT "6 Clicks" Mobility   Outcome Measure  Help needed turning from your back to your side while in a flat bed without using bedrails?: A Little Help needed moving from lying on your back to sitting on the side of a flat bed without using bedrails?: A  Little Help needed moving to and from a bed to a chair (including a wheelchair)?: A Little Help needed standing up from a chair using your arms (e.g., wheelchair or bedside chair)?: A Little Help needed to walk in hospital room?: A Little Help needed climbing 3-5 steps with a railing? : A Lot 6 Click Score: 17    End of Session Equipment Utilized During Treatment: Gait belt Activity Tolerance: Patient tolerated treatment well Patient left: in chair;with call bell/phone within reach;with chair alarm set;with family/visitor present   PT Visit Diagnosis: Pain;Other abnormalities of gait and mobility (R26.89) Pain - Right/Left: Right Pain - part of body: Knee     Time: 1540-0867 PT Time Calculation (min) (ACUTE ONLY): 26 min  Charges:  $Gait Training: 8-22 mins $Therapeutic Exercise: 8-22 mins                         Faye Ramsay, PT Acute Rehabilitation  Office: [phone] Pager: [phone]

## 2020-02-19 NOTE — Discharge Summary (Deleted)
Patient ID: [name] MRN: [id number] DOB/AGE: 1930-12-26 86 y.o.  Admit date: 02/18/2020 Discharge date: 02/20/2020  Admission Diagnoses:  Principal Problem:   Primary osteoarthritis of right knee   Discharge Diagnoses:  Same  Past Medical History:  Diagnosis Date  . Acute combined systolic and diastolic heart failure (HCC) 05/02/2017  . Anemia   . Anxiety   . Arthritis    "knees" (06/18/2015)  . Atrial fibrillation with RVR (HCC) 06/18/2015   h/o sig bleed on coumadin  . Cardiogenic shock (HCC) 05/02/2017  . CHF (congestive heart failure) (HCC)   . Complication of anesthesia 2010   "w/cardiac cath; got into a psychotic state for ~ 3 days; got me out w/Valium"  . Depression    "I have it off and on; not as often as I've gotten older" (06/18/2015)  . Diverticulosis    descending and sigmoid colon--Dr. Jarold Motto  . DJD (degenerative joint disease) of knee    left  . Dyspnea   . Dysrhythmia   . E coli infection    History of  . GERD (gastroesophageal reflux disease)   . History of Clostridium difficile colitis   . IBS (irritable bowel syndrome)   . Insomnia     off chronic ambien 5mg  as of 10/11, rare/episodic use since then  DCM/CHF  . LBBB (left bundle branch block)   . Lymphocytic colitis 09/27/2016  . Moderate to severe pulmonary hypertension (HCC) 11/26/2019  . Nonischemic cardiomyopathy (HCC)    normal coronaries, EF 15-20% 04/2008, EF 50-55% 2015  . Osteoporosis    on DXA 05/2010  . Positive PPD   . Retroperitoneal bleed   . Sciatica    Right    Surgeries: Procedure(s): RIGHT TOTAL KNEE ARTHROPLASTY on 02/18/2020   Consultants:   Discharged Condition: Improved  Hospital Course: HETAL PROANO is an 86 y.o. female who was admitted 02/18/2020 for operative treatment ofPrimary osteoarthritis of right knee. Patient has severe unremitting pain that affects sleep, daily activities, and work/hobbies. After pre-op clearance the patient was taken to the operating  room on 02/18/2020 and underwent  Procedure(s): RIGHT TOTAL KNEE ARTHROPLASTY.    Patient was given perioperative antibiotics:  Anti-infectives (From admission, onward)   Start     Dose/Rate Route Frequency Ordered Stop   02/18/20 1500  ceFAZolin (ANCEF) IVPB 2g/100 mL premix        2 g 200 mL/hr over 30 Minutes Intravenous Every 6 hours 02/18/20 1414 02/19/20 0313   02/18/20 0600  ceFAZolin (ANCEF) IVPB 2g/100 mL premix        2 g 200 mL/hr over 30 Minutes Intravenous On call to O.R. 02/18/20 13/09/21 02/18/20 0813       Patient was given sequential compression devices, early ambulation, and chemoprophylaxis to prevent DVT.  Patient benefited maximally from hospital stay and there were no complications.    Recent vital signs:  Patient Vitals for the past 24 hrs:  BP Temp Temp src Pulse Resp SpO2 Height Weight  02/19/20 0605 126/74 98.2 F (36.8 C) Oral 85 15 96 % -- --  02/19/20 0018 125/65 98.2 F (36.8 C) Oral 70 15 97 % -- --  02/18/20 2147 (!) 121/59 97.8 F (36.6 C) Oral 67 14 99 % -- --  02/18/20 1732 (!) 156/78 97.7 F (36.5 C) Oral 68 14 100 % -- --  02/18/20 1415 -- -- -- -- -- -- 5\' 5"  (1.651 m) 70.5 kg  02/18/20 1406 121/70 97.7 F (36.5 C) Oral 69  18 96 % -- --  02/18/20 1300 135/79 97.9 F (36.6 C) -- 84 15 99 % -- --  02/18/20 1145 (!) 144/73 98 F (36.7 C) -- 93 16 98 % -- --  02/18/20 1045 (!) 147/81 -- -- 86 16 97 % -- --  02/18/20 1015 139/66 -- -- 66 14 99 % -- --  02/18/20 1000 112/67 -- -- (!) 109 13 98 % -- --  02/18/20 0945 129/70 97.8 F (36.6 C) -- 76 17 100 % -- --     Recent laboratory studies:  Recent Labs    02/18/20 0607 02/18/20 0607 02/19/20 0329  WBC  --   --  12.1*  HGB  --   --  11.5*  HCT  --   --  35.4*  PLT  --   --  264  NA 134*  --  132*  K 3.7  --  4.5  CL 102  --  100  CO2 23  --  26  BUN 11  --  17  CREATININE 0.76  --  0.81  GLUCOSE 128*  --  150*  INR 1.1  --   --   CALCIUM 9.1   < > 8.7*   < > = values in this  interval not displayed.     Discharge Medications:   Allergies as of 02/19/2020      Reactions   Ace Inhibitors Cough   Warfarin Sodium Other (See Comments)   DOSE RELATED PHARMACOLOGIC EFFECT "bleed out"   Famotidine Other (See Comments)   GI upset   Codeine Other (See Comments)   sedation   Delsym [dextromethorphan Polistirex Er] Other (See Comments)   dizziness   Lactose Intolerance (gi) Diarrhea   Phenylephrine Palpitations, Other (See Comments)   Nasal spray- "likely increase in nasal congestion".    Sulfamethoxazole-trimethoprim Nausea And Vomiting   GI intolerance.      Medication List    TAKE these medications   BIOTIN PO Take 1 tablet by mouth daily.   calcium carbonate 500 MG chewable tablet Commonly known as: TUMS - dosed in mg elemental calcium Chew 1 tablet by mouth 2 (two) times daily as needed for indigestion or heartburn.   cholecalciferol 25 MCG (1000 UNIT) tablet Commonly known as: VITAMIN D3 Take 1,000 Units by mouth daily.   [employer] LUBRICATING/DRY EYE OP Place 1-2 drops into both eyes daily as needed (for dry eyes).   digoxin 0.125 MG tablet Commonly known as: LANOXIN TAKE 1 TABLET BY MOUTH DAILY   Eliquis 2.5 MG Tabs tablet Generic drug: apixaban TAKE 1 TABLET BY MOUTH TWICE A DAY What changed: how much to take   loperamide 2 MG capsule Commonly known as: IMODIUM Take 2 mg by mouth 3 (three) times daily as needed for diarrhea or loose stools.   metoprolol succinate 50 MG 24 hr tablet Commonly known as: TOPROL-XL Take 1.5 tablets (75 mg total) by mouth daily. Take with or immediately following a meal.   mirabegron ER 25 MG Tb24 tablet Commonly known as: Myrbetriq Take 1 tablet (25 mg total) by mouth daily.   phenylephrine 1 % nasal spray Commonly known as: NEO-SYNEPHRINE Place 1 drop into both nostrils every 6 (six) hours as needed for congestion.   potassium chloride SA 20 MEQ tablet Commonly known as: KLOR-CON Take 20 mEq by mouth  daily.   predniSONE 20 MG tablet Commonly known as: DELTASONE Take 20 mg by mouth every other day.   predniSONE 10 MG  tablet Commonly known as: DELTASONE Take 2 tablets (20 mg total) by mouth daily with breakfast.   sertraline 50 MG tablet Commonly known as: ZOLOFT Take 1 tablet (50 mg total) by mouth daily.   SYSTANE OP Place 1 drop into both eyes daily as needed (dry eyes).   tizanidine 2 MG capsule Commonly known as: ZANAFLEX Take 1 capsule (2 mg total) by mouth every 6 (six) hours as needed for muscle spasms. What changed: when to take this   traMADol 50 MG tablet Commonly known as: ULTRAM Take 1-2 tablets (50-100 mg total) by mouth every 6 (six) hours as needed for moderate pain or severe pain (post op pain). What changed:   how much to take  when to take this  reasons to take this   trolamine salicylate 10 % cream Commonly known as: ASPERCREME Apply 1 application topically at bedtime.   TYLENOL 500 MG tablet Generic drug: acetaminophen Take 1,000 mg by mouth 3 (three) times daily as needed for moderate pain or headache.            Durable Medical Equipment  (From admission, onward)         Start     Ordered   02/18/20 1415  DME Walker rolling  Once       Question:  Patient needs a walker to treat with the following condition  Answer:  Primary osteoarthritis of right knee   02/18/20 1414   02/18/20 1415  DME 3 n 1  Once        02/18/20 1414   02/18/20 1415  DME Bedside commode  Once       Question:  Patient needs a bedside commode to treat with the following condition  Answer:  Primary osteoarthritis of right knee   02/18/20 1414          Diagnostic Studies: No results found.  Disposition: Discharge disposition: 01-Home or Self Care       Discharge Instructions    Call MD / Call 911   Complete by: As directed    If you experience chest pain or shortness of breath, CALL 911 and be transported to the hospital emergency room.  If you  develope a fever above 101 F, pus (white drainage) or increased drainage or redness at the wound, or calf pain, call your surgeon's office.   Constipation Prevention   Complete by: As directed    Drink plenty of fluids.  Prune juice may be helpful.  You may use a stool softener, such as Colace (over the counter) 100 mg twice a day.  Use MiraLax (over the counter) for constipation as needed.   Diet - low sodium heart healthy   Complete by: As directed    Discharge instructions   Complete by: As directed    INSTRUCTIONS AFTER JOINT REPLACEMENT   Remove items at home which could result in a fall. This includes throw rugs or furniture in walking pathways ICE to the affected joint every three hours while awake for 30 minutes at a time, for at least the first 3-5 days, and then as needed for pain and swelling.  Continue to use ice for pain and swelling. You may notice swelling that will progress down to the foot and ankle.  This is normal after surgery.  Elevate your leg when you are not up walking on it.   Continue to use the breathing machine you got in the hospital (incentive spirometer) which will help keep your temperature down.  It is common for your temperature to cycle up and down following surgery, especially at night when you are not up moving around and exerting yourself.  The breathing machine keeps your lungs expanded and your temperature down.   DIET:  As you were doing prior to hospitalization, we recommend a well-balanced diet.  DRESSING / WOUND CARE / SHOWERING  You may shower 3 days after surgery, but keep the wounds dry during showering.  You may use an occlusive plastic wrap (Press'n Seal for example), NO SOAKING/SUBMERGING IN THE BATHTUB.  If the bandage gets wet, change with a clean dry gauze.  If the incision gets wet, pat the wound dry with a clean towel.  ACTIVITY  Increase activity slowly as tolerated, but follow the weight bearing instructions below.   No driving for 6  weeks or until further direction given by your physician.  You cannot drive while taking narcotics.  No lifting or carrying greater than 10 lbs. until further directed by your surgeon. Avoid periods of inactivity such as sitting longer than an hour when not asleep. This helps prevent blood clots.  You may return to work once you are authorized by your doctor.     WEIGHT BEARING   Weight bearing as tolerated with assist device (walker, cane, etc) as directed, use it as long as suggested by your surgeon or therapist, typically at least 4-6 weeks.   EXERCISES  Results after joint replacement surgery are often greatly improved when you follow the exercise, range of motion and muscle strengthening exercises prescribed by your doctor. Safety measures are also important to protect the joint from further injury. Any time any of these exercises cause you to have increased pain or swelling, decrease what you are doing until you are comfortable again and then slowly increase them. If you have problems or questions, call your caregiver or physical therapist for advice.   Rehabilitation is important following a joint replacement. After just a few days of immobilization, the muscles of the leg can become weakened and shrink (atrophy).  These exercises are designed to build up the tone and strength of the thigh and leg muscles and to improve motion. Often times heat used for twenty to thirty minutes before working out will loosen up your tissues and help with improving the range of motion but do not use heat for the first two weeks following surgery (sometimes heat can increase post-operative swelling).   These exercises can be done on a training (exercise) mat, on the floor, on a table or on a bed. Use whatever works the best and is most comfortable for you.    Use music or television while you are exercising so that the exercises are a pleasant break in your day. This will make your life better with the  exercises acting as a break in your routine that you can look forward to.   Perform all exercises about fifteen times, three times per day or as directed.  You should exercise both the operative leg and the other leg as well.  Exercises include:   Quad Sets - Tighten up the muscle on the front of the thigh (Quad) and hold for 5-10 seconds.   Straight Leg Raises - With your knee straight (if you were given a brace, keep it on), lift the leg to 60 degrees, hold for 3 seconds, and slowly lower the leg.  Perform this exercise against resistance later as your leg gets stronger.  Leg Slides: Lying on your back,  slowly slide your foot toward your buttocks, bending your knee up off the floor (only go as far as is comfortable). Then slowly slide your foot back down until your leg is flat on the floor again.  Angel Wings: Lying on your back spread your legs to the side as far apart as you can without causing discomfort.  Hamstring Strength:  Lying on your back, push your heel against the floor with your leg straight by tightening up the muscles of your buttocks.  Repeat, but this time bend your knee to a comfortable angle, and push your heel against the floor.  You may put a pillow under the heel to make it more comfortable if necessary.   A rehabilitation program following joint replacement surgery can speed recovery and prevent re-injury in the future due to weakened muscles. Contact your doctor or a physical therapist for more information on knee rehabilitation.    CONSTIPATION  Constipation is defined medically as fewer than three stools per week and severe constipation as less than one stool per week.  Even if you have a regular bowel pattern at home, your normal regimen is likely to be disrupted due to multiple reasons following surgery.  Combination of anesthesia, postoperative narcotics, change in appetite and fluid intake all can affect your bowels.   YOU MUST use at least one of the following  options; they are listed in order of increasing strength to get the job done.  They are all available over the counter, and you may need to use some, POSSIBLY even all of these options:    Drink plenty of fluids (prune juice may be helpful) and high fiber foods Colace 100 mg by mouth twice a day  Senokot for constipation as directed and as needed Dulcolax (bisacodyl), take with full glass of water  Miralax (polyethylene glycol) once or twice a day as needed.  If you have tried all these things and are unable to have a bowel movement in the first 3-4 days after surgery call either your surgeon or your primary doctor.    If you experience loose stools or diarrhea, hold the medications until you stool forms back up.  If your symptoms do not get better within 1 week or if they get worse, check with your doctor.  If you experience "the worst abdominal pain ever" or develop nausea or vomiting, please contact the office immediately for further recommendations for treatment.   ITCHING:  If you experience itching with your medications, try taking only a single pain pill, or even half a pain pill at a time.  You can also use Benadryl over the counter for itching or also to help with sleep.   TED HOSE STOCKINGS:  Use stockings on both legs until for at least 2 weeks or as directed by physician office. They may be removed at night for sleeping.  MEDICATIONS:  See your medication summary on the "After Visit Summary" that nursing will review with you.  You may have some home medications which will be placed on hold until you complete the course of blood thinner medication.  It is important for you to complete the blood thinner medication as prescribed.  PRECAUTIONS:  If you experience chest pain or shortness of breath - call 911 immediately for transfer to the hospital emergency department.   If you develop a fever greater that 101 F, purulent drainage from wound, increased redness or drainage from wound, foul  odor from the wound/dressing, or calf pain - CONTACT  YOUR SURGEON.                                                   FOLLOW-UP APPOINTMENTS:  If you do not already have a post-op appointment, please call the office for an appointment to be seen by your surgeon.  Guidelines for how soon to be seen are listed in your "After Visit Summary", but are typically between 1-4 weeks after surgery.  OTHER INSTRUCTIONS:   Knee Replacement:  Do not place pillow under knee, focus on keeping the knee straight while resting. CPM instructions: 0-90 degrees, 2 hours in the morning, 2 hours in the afternoon, and 2 hours in the evening. Place foam block, curve side up under heel at all times except when in CPM or when walking.  DO NOT modify, tear, cut, or change the foam block in any way.   DENTAL ANTIBIOTICS:  In most cases prophylactic antibiotics for Dental procdeures after total joint surgery are not necessary.  Exceptions are as follows:  1. History of prior total joint infection  2. Severely immunocompromised (Organ Transplant, cancer chemotherapy, Rheumatoid biologic meds such as Humera)  3. Poorly controlled diabetes (A1C &gt; 8.0, blood glucose over 200)  If you have one of these conditions, contact your surgeon for an antibiotic prescription, prior to your dental procedure.   MAKE SURE YOU:  Understand these instructions.  Get help right away if you are not doing well or get worse.    Thank you for letting us be a part of your medical care team.  It is a privilege we respect greatly.  We hope these instructions will help you stay on track for a fast and full recovery!   Increase activity slowly as tolerated   Complete by: As directed        Follow-up Information    [name], MD. Go on 02/28/2020.   Specialty: Orthopedic Surgery Why: Your appointment has been made for 9:00 Contact information: [address] [place] [address] [phone]        [facility]  Follow up.   Specialty: Home Health Services Why: Home Health PT will see you for 5 visits prior to you starting outpatient physical therapy  Contact information: [address] [place] [address] [phone]        [facility], [place]. Go on 02/28/2020.   Why: you will start Outpatient physical therapy after you MD appointment. they will call you with a time   Contact information: Physical Therapy [address] [place] [address] [phone]                Signed: Ginger Organ Enna Warwick 02/19/2020, 8:13 AM

## 2020-02-19 NOTE — Progress Notes (Signed)
Orthopedic Tech Progress Note Patient Details:  [name] [birth year] [id number]  Ortho Devices Type of Ortho Device: Knee Immobilizer Ortho Device/Splint Location: right Ortho Device/Splint Interventions: Application   Post Interventions Patient Tolerated: Well   Saul Fordyce 02/19/2020, 11:01 AM

## 2020-02-20 DIAGNOSIS — I4891 Unspecified atrial fibrillation: Secondary | ICD-10-CM | POA: Diagnosis not present

## 2020-02-20 DIAGNOSIS — M1711 Unilateral primary osteoarthritis, right knee: Secondary | ICD-10-CM | POA: Diagnosis not present

## 2020-02-20 DIAGNOSIS — Z79899 Other long term (current) drug therapy: Secondary | ICD-10-CM | POA: Diagnosis not present

## 2020-02-20 DIAGNOSIS — I5041 Acute combined systolic (congestive) and diastolic (congestive) heart failure: Secondary | ICD-10-CM | POA: Diagnosis not present

## 2020-02-20 DIAGNOSIS — Z7901 Long term (current) use of anticoagulants: Secondary | ICD-10-CM | POA: Diagnosis not present

## 2020-02-20 LAB — CBC
HCT: 30.6 % — ABNORMAL LOW (ref 36.0–46.0)
Hemoglobin: 9.6 g/dL — ABNORMAL LOW (ref 12.0–15.0)
MCH: 30.9 pg (ref 26.0–34.0)
MCHC: 31.4 g/dL (ref 30.0–36.0)
MCV: 98.4 fL (ref 80.0–100.0)
Platelets: 200 10*3/uL (ref 150–400)
RBC: 3.11 MIL/uL — ABNORMAL LOW (ref 3.87–5.11)
RDW: 13.2 % (ref 11.5–15.5)
WBC: 9 10*3/uL (ref 4.0–10.5)
nRBC: 0 % (ref 0.0–0.2)

## 2020-02-20 NOTE — Progress Notes (Signed)
Physical Therapy Treatment Patient Details Name: [name] MRN: [id number] DOB: Apr 01, 1931 Today's Date: 02/20/2020    History of Present Illness 86 yo female s/p R TKA 02/18/2020. Hx of L TK revision 11/2019, L hip hemi 07/2019    PT Comments    Pt continues cooperative but requiring increased time for all tasks and limited by c/o fatigue and mild dizziness (BP 110/67).  Pt assisted bed<>BSC and unable to participate further.  Will follow in am.   Follow Up Recommendations  Follow surgeon's recommendation for DC plan and follow-up therapies;Supervision for mobility/OOB     Equipment Recommendations  None recommended by PT    Recommendations for Other Services       Precautions / Restrictions Precautions Precautions: Fall;Knee Required Braces or Orthoses: Knee Immobilizer - Right Knee Immobilizer - Right: On when out of bed or walking;Discontinue once straight leg raise with < 10 degree lag Restrictions Weight Bearing Restrictions: No Other Position/Activity Restrictions: WBAT    Mobility  Bed Mobility Overal bed mobility: Needs Assistance Bed Mobility: Supine to Sit;Sit to Supine     Supine to sit: Supervision Sit to supine: Min assist   General bed mobility comments: Increased time. Assist to bring LEs onto bed with increased fatigue  Transfers Overall transfer level: Needs assistance Equipment used: Rolling walker (2 wheeled) Transfers: Sit to/from [employer] Sit to Stand: Min assist;From elevated surface Stand pivot transfers: Min assist       General transfer comment: VCs safety, technique, hand placement. Assist to power up, stabilize, control descent. Increased assist to rise with KI in place (residual weakness of L LE from recent prior surgeries)  Ambulation/Gait Ambulation/Gait assistance: Min assist Gait Distance (Feet): 5 Feet Assistive device: Rolling walker (2 wheeled) Gait Pattern/deviations: Step-to pattern;Decreased stance  time - right;Antalgic Gait velocity: decr   General Gait Details: Cues for sequence, posture and position from RW; Distance ltd by pt c/o "weakness"    Stairs             Wheelchair Mobility    Modified Rankin (Stroke Patients Only)       Balance Overall balance assessment: Needs assistance Sitting-balance support: No upper extremity supported;Feet supported Sitting balance-Leahy Scale: Good     Standing balance support: Bilateral upper extremity supported Standing balance-Leahy Scale: Poor                              Cognition Arousal/Alertness: Awake/alert Behavior During Therapy: WFL for tasks assessed/performed Overall Cognitive Status: Within Functional Limits for tasks assessed                                        Exercises      General Comments        Pertinent Vitals/Pain Pain Assessment: 0-10 Pain Score: 5  Pain Location: R thigh Pain Descriptors / Indicators: Discomfort;Grimacing;Guarding;Aching;Sore Pain Intervention(s): Limited activity within patient's tolerance;Premedicated before session;Monitored during session    Home Living                      Prior Function            PT Goals (current goals can now be found in the care plan section) Acute Rehab PT Goals Patient Stated Goal: regain strength in LEs. PT Goal Formulation: With patient Time For Goal Achievement: 03/03/20  Potential to Achieve Goals: Good Progress towards PT goals: Not progressing toward goals - comment (fatigue)    Frequency    7X/week      PT Plan Current plan remains appropriate    Co-evaluation              AM-PAC PT "6 Clicks" Mobility   Outcome Measure  Help needed turning from your back to your side while in a flat bed without using bedrails?: A Little Help needed moving from lying on your back to sitting on the side of a flat bed without using bedrails?: A Little Help needed moving to and from a bed  to a chair (including a wheelchair)?: A Little Help needed standing up from a chair using your arms (e.g., wheelchair or bedside chair)?: A Little Help needed to walk in hospital room?: A Little Help needed climbing 3-5 steps with a railing? : A Lot 6 Click Score: 17    End of Session Equipment Utilized During Treatment: Gait belt Activity Tolerance: Patient tolerated treatment well Patient left: with call bell/phone within reach;in bed;with bed alarm set;with family/visitor present;with nursing/sitter in room Nurse Communication: Mobility status PT Visit Diagnosis: Pain;Other abnormalities of gait and mobility (R26.89) Pain - Right/Left: Right Pain - part of body: Knee     Time: 9024-0973 PT Time Calculation (min) (ACUTE ONLY): 29 min  Charges:  $Therapeutic Activity: 23-37 mins                     Mauro Kaufmann PT Acute Rehabilitation Services Pager [phone] Office [phone]    Adriane Gabbert 02/20/2020, 4:01 PM

## 2020-02-20 NOTE — Progress Notes (Signed)
Physical Therapy Treatment Patient Details Name: [name] MRN: [id number] DOB: [birth year] Today's Date: 02/20/2020    History of Present Illness 86 yo female s/p R TKA 02/18/2020. 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  Pt denies dizziness but states "I just feel weak".  BP 120/76.  RN aware.   Follow Up Recommendations  Follow surgeon's recommendation for DC plan and follow-up therapies;Supervision for mobility/OOB     Equipment Recommendations  None recommended by PT    Recommendations for Other Services       Precautions / Restrictions Precautions Precautions: Fall;Knee Required Braces or Orthoses: Knee Immobilizer - Right Knee Immobilizer - Right: On when out of bed or walking;Discontinue once straight leg raise with < 10 degree lag Restrictions Weight Bearing Restrictions: No Other Position/Activity Restrictions: WBAT    Mobility  Bed Mobility Overal bed mobility: Needs Assistance Bed Mobility: Supine to Sit     Supine to sit: Supervision     General bed mobility comments: Increased time. Pt uses UEs to assist LEs on/off bed.  Transfers Overall transfer level: Needs assistance Equipment used: Rolling walker (2 wheeled) Transfers: Sit to/from Stand Sit to Stand: Min assist;From elevated surface         General transfer comment: VCs safety, technique, hand placement. Assist to power up, stabilize, control descent. Increased assist to rise with KI in place (residual weakness of L LE from recent prior surgeries)  Ambulation/Gait Ambulation/Gait assistance: Min assist Gait Distance (Feet): 20 Feet (and 10') Assistive device: Rolling walker (2 wheeled) Gait Pattern/deviations: Step-to pattern;Decreased stance time - right;Antalgic Gait velocity: decr   General Gait Details: Cues for sequence, posture and  position from RW; Distance ltd by pt c/o "weakness" and two near falls   Stairs             Wheelchair Mobility    Modified Rankin (Stroke Patients Only)       Balance Overall balance assessment: Needs assistance Sitting-balance support: No upper extremity supported;Feet supported Sitting balance-Leahy Scale: Good     Standing balance support: Bilateral upper extremity supported Standing balance-Leahy Scale: Poor                              Cognition Arousal/Alertness: Awake/alert Behavior During Therapy: WFL for tasks assessed/performed Overall Cognitive Status: Within Functional Limits for tasks assessed                                        Exercises Total Joint Exercises Ankle Circles/Pumps: AROM;Both;20 reps;Supine Quad Sets: AROM;10 reps;Both;Supine Heel Slides: AAROM;Right;Supine;10 reps Straight Leg Raises: AAROM;Right;10 reps;Supine    General Comments        Pertinent Vitals/Pain Pain Assessment: 0-10 Pain Score: 5  Pain Location: R thigh Pain Descriptors / Indicators: Discomfort;Grimacing;Guarding;Aching;Sore Pain Intervention(s): Limited activity within patient's tolerance;Monitored during session;Premedicated before session;Ice applied    Home Living                      Prior Function            PT Goals (current goals can now be found in the care plan section) Acute Rehab PT Goals  Patient Stated Goal: regain strength in LEs. PT Goal Formulation: With patient Time For Goal Achievement: 03/03/20 Potential to Achieve Goals: Good Progress towards PT goals: Not progressing toward goals - comment (LTd by fatigue)    Frequency    7X/week      PT Plan Current plan remains appropriate    Co-evaluation              AM-PAC PT "6 Clicks" Mobility   Outcome Measure  Help needed turning from your back to your side while in a flat bed without using bedrails?: A Little Help needed moving  from lying on your back to sitting on the side of a flat bed without using bedrails?: A Little Help needed moving to and from a bed to a chair (including a wheelchair)?: A Little Help needed standing up from a chair using your arms (e.g., wheelchair or bedside chair)?: A Little Help needed to walk in hospital room?: A Little Help needed climbing 3-5 steps with a railing? : A Lot 6 Click Score: 17    End of Session Equipment Utilized During Treatment: Gait belt Activity Tolerance: Patient tolerated treatment well Patient left: in chair;with call bell/phone within reach;with chair alarm set;with family/visitor present Nurse Communication: Mobility status PT Visit Diagnosis: Pain;Other abnormalities of gait and mobility (R26.89) Pain - Right/Left: Right Pain - part of body: Knee     Time: 8546-2703 PT Time Calculation (min) (ACUTE ONLY): 36 min  Charges:  $Gait Training: 8-22 mins $Therapeutic Exercise: 8-22 mins                     Mauro Kaufmann PT Acute Rehabilitation Services Pager [phone] Office [phone]    Beckie Viscardi 02/20/2020, 12:01 PM

## 2020-02-20 NOTE — Progress Notes (Signed)
Subjective: 2 Days Post-Op Procedure(s) (LRB): RIGHT TOTAL KNEE ARTHROPLASTY (Right)  Patient is feeling much better this morning. She states that the muscle relaxer helps a lit with her pain. She is hoping to go home after PT this morning.  Activity level:  wbat Diet tolerance:  ok Voiding:  ok Patient reports pain as mild.    Objective: Vital signs in last 24 hours: Temp:  [97.7 F (36.5 C)-98.7 F (37.1 C)] 98.3 F (36.8 C) (11/11 0552) Pulse Rate:  [67-97] 97 (11/11 0552) Resp:  [14-20] 20 (11/11 0552) BP: (100-141)/(50-79) 110/69 (11/11 0552) SpO2:  [95 %-99 %] 96 % (11/11 0552)  Labs: Recent Labs    02/19/20 0329 02/20/20 0330  HGB 11.5* 9.6*   Recent Labs    02/19/20 0329 02/20/20 0330  WBC 12.1* 9.0  RBC 3.68* 3.11*  HCT 35.4* 30.6*  PLT 264 200   Recent Labs    02/18/20 0607 02/19/20 0329  NA 134* 132*  K 3.7 4.5  CL 102 100  CO2 23 26  BUN 11 17  CREATININE 0.76 0.81  GLUCOSE 128* 150*  CALCIUM 9.1 8.7*   Recent Labs    02/18/20 0607  INR 1.1    Physical Exam:  Neurologically intact ABD soft Neurovascular intact Sensation intact distally Intact pulses distally Dorsiflexion/Plantar flexion intact Incision: dressing C/D/I No cellulitis present Compartment soft  Assessment/Plan:  2 Days Post-Op Procedure(s) (LRB): RIGHT TOTAL KNEE ARTHROPLASTY (Right) Advance diet Up with therapy D/C IV fluids Discharge home with home health after PT if cleared and doing well. Continue on home eliquis. Follow up in office 2 weeks post op.    Ginger Organ Gabryel Files 02/20/2020, 6:28 AM

## 2020-02-20 NOTE — Plan of Care (Signed)
°  Problem: Education: °Goal: Knowledge of General Education information will improve °Description: Including pain rating scale, medication(s)/side effects and non-pharmacologic comfort measures °Outcome: Progressing °  °Problem: Health Behavior/Discharge Planning: °Goal: Ability to manage health-related needs will improve °Outcome: Progressing °  °Problem: Clinical Measurements: °Goal: Ability to maintain clinical measurements within normal limits will improve °Outcome: Progressing °Goal: Will remain free from infection °Outcome: Progressing °Goal: Diagnostic test results will improve °Outcome: Progressing °Goal: Respiratory complications will improve °Outcome: Progressing °Goal: Cardiovascular complication will be avoided °Outcome: Progressing °  °Problem: Activity: °Goal: Risk for activity intolerance will decrease °Outcome: Progressing °  °Problem: Nutrition: °Goal: Adequate nutrition will be maintained °Outcome: Progressing °  °Problem: Coping: °Goal: Level of anxiety will decrease °Outcome: Progressing °  °Problem: Elimination: °Goal: Will not experience complications related to bowel motility °Outcome: Progressing °Goal: Will not experience complications related to urinary retention °Outcome: Progressing °  °Problem: Pain Managment: °Goal: General experience of comfort will improve °Outcome: Progressing °  °Problem: Safety: °Goal: Ability to remain free from injury will improve °Outcome: Progressing °  °Problem: Education: °Goal: Knowledge of the prescribed therapeutic regimen will improve °Outcome: Progressing °Goal: Individualized Educational Video(s) °Outcome: Progressing °  °Problem: Activity: °Goal: Ability to avoid complications of mobility impairment will improve °Outcome: Progressing °Goal: Range of joint motion will improve °Outcome: Progressing °  °Problem: Clinical Measurements: °Goal: Postoperative complications will be avoided or minimized °Outcome: Progressing °  °Problem: Pain Management: °Goal:  Pain level will decrease with appropriate interventions °Outcome: Progressing °  °

## 2020-02-21 DIAGNOSIS — Z79899 Other long term (current) drug therapy: Secondary | ICD-10-CM | POA: Diagnosis not present

## 2020-02-21 DIAGNOSIS — M1711 Unilateral primary osteoarthritis, right knee: Secondary | ICD-10-CM | POA: Diagnosis not present

## 2020-02-21 DIAGNOSIS — I4891 Unspecified atrial fibrillation: Secondary | ICD-10-CM | POA: Diagnosis not present

## 2020-02-21 DIAGNOSIS — I5041 Acute combined systolic (congestive) and diastolic (congestive) heart failure: Secondary | ICD-10-CM | POA: Diagnosis not present

## 2020-02-21 DIAGNOSIS — Z7901 Long term (current) use of anticoagulants: Secondary | ICD-10-CM | POA: Diagnosis not present

## 2020-02-21 LAB — CBC
HCT: 30.2 % — ABNORMAL LOW (ref 36.0–46.0)
Hemoglobin: 9.6 g/dL — ABNORMAL LOW (ref 12.0–15.0)
MCH: 31.2 pg (ref 26.0–34.0)
MCHC: 31.8 g/dL (ref 30.0–36.0)
MCV: 98.1 fL (ref 80.0–100.0)
Platelets: 216 10*3/uL (ref 150–400)
RBC: 3.08 MIL/uL — ABNORMAL LOW (ref 3.87–5.11)
RDW: 13.2 % (ref 11.5–15.5)
WBC: 10.8 10*3/uL — ABNORMAL HIGH (ref 4.0–10.5)
nRBC: 0 % (ref 0.0–0.2)

## 2020-02-21 NOTE — Discharge Summary (Signed)
Patient ID: [name] MRN: [id number] DOB/AGE: 17-Nov-1930 86 y.o.  Admit date: 02/18/2020 Discharge date: 02/21/2020  Admission Diagnoses:  Principal Problem:   Primary osteoarthritis of right knee   Discharge Diagnoses:  Same  Past Medical History:  Diagnosis Date  . Acute combined systolic and diastolic heart failure (HCC) 05/02/2017  . Anemia   . Anxiety   . Arthritis    "knees" (06/18/2015)  . Atrial fibrillation with RVR (HCC) 06/18/2015   h/o sig bleed on coumadin  . Cardiogenic shock (HCC) 05/02/2017  . CHF (congestive heart failure) (HCC)   . Complication of anesthesia 2010   "w/cardiac cath; got into a psychotic state for ~ 3 days; got me out w/Valium"  . Depression    "I have it off and on; not as often as I've gotten older" (06/18/2015)  . Diverticulosis    descending and sigmoid colon--Dr. Jarold Motto  . DJD (degenerative joint disease) of knee    left  . Dyspnea   . Dysrhythmia   . E coli infection    History of  . GERD (gastroesophageal reflux disease)   . History of Clostridium difficile colitis   . IBS (irritable bowel syndrome)   . Insomnia     off chronic ambien 5mg  as of 10/11, rare/episodic use since then  DCM/CHF  . LBBB (left bundle branch block)   . Lymphocytic colitis 09/27/2016  . Moderate to severe pulmonary hypertension (HCC) 11/26/2019  . Nonischemic cardiomyopathy (HCC)    normal coronaries, EF 15-20% 04/2008, EF 50-55% 2015  . Osteoporosis    on DXA 05/2010  . Positive PPD   . Retroperitoneal bleed   . Sciatica    Right    Surgeries: Procedure(s): RIGHT TOTAL KNEE ARTHROPLASTY on 02/18/2020   Consultants:   Discharged Condition: Improved  Hospital Course: [name] is an 86 y.o. female who was admitted 02/18/2020 for operative treatment ofPrimary osteoarthritis of right knee. Patient has severe unremitting pain that affects sleep, daily activities, and work/hobbies. After pre-op clearance the patient was taken to the operating  room on 02/18/2020 and underwent  Procedure(s): RIGHT TOTAL KNEE ARTHROPLASTY.    Patient was given perioperative antibiotics:  Anti-infectives (From admission, onward)   Start     Dose/Rate Route Frequency Ordered Stop   02/18/20 1500  ceFAZolin (ANCEF) IVPB 2g/100 mL premix        2 g 200 mL/hr over 30 Minutes Intravenous Every 6 hours 02/18/20 1414 02/19/20 0313   02/18/20 0600  ceFAZolin (ANCEF) IVPB 2g/100 mL premix        2 g 200 mL/hr over 30 Minutes Intravenous On call to O.R. 02/18/20 [id number] 02/18/20 0813       Patient was given sequential compression devices, early ambulation, and chemoprophylaxis to prevent DVT.  Patient benefited maximally from hospital stay and there were no complications.    Recent vital signs:  Patient Vitals for the past 24 hrs:  BP Temp Temp src Pulse Resp SpO2  02/21/20 0452 105/73 98.5 F (36.9 C) Oral 80 20 95 %  02/20/20 2004 132/74 98.2 F (36.8 C) Oral 100 20 95 %  02/20/20 1327 109/72 99.3 F (37.4 C) Oral 73 16 95 %     Recent laboratory studies:  Recent Labs    02/19/20 0329 02/19/20 0329 02/20/20 0330 02/21/20 0315  WBC 12.1*   < > 9.0 10.8*  HGB 11.5*   < > 9.6* 9.6*  HCT 35.4*   < > 30.6* 30.2*  PLT  264   < > 200 216  NA 132*  --   --   --   K 4.5  --   --   --   CL 100  --   --   --   CO2 26  --   --   --   BUN 17  --   --   --   CREATININE 0.81  --   --   --   GLUCOSE 150*  --   --   --   CALCIUM 8.7*  --   --   --    < > = values in this interval not displayed.     Discharge Medications:   Allergies as of 02/21/2020      Reactions   Ace Inhibitors Cough   Warfarin Sodium Other (See Comments)   DOSE RELATED PHARMACOLOGIC EFFECT "bleed out"   Famotidine Other (See Comments)   GI upset   Codeine Other (See Comments)   sedation   Delsym [dextromethorphan Polistirex Er] Other (See Comments)   dizziness   Lactose Intolerance (gi) Diarrhea   Phenylephrine Palpitations, Other (See Comments)   Nasal spray-  "likely increase in nasal congestion".    Sulfamethoxazole-trimethoprim Nausea And Vomiting   GI intolerance.      Medication List    TAKE these medications   BIOTIN PO Take 1 tablet by mouth daily.   calcium carbonate 500 MG chewable tablet Commonly known as: TUMS - dosed in mg elemental calcium Chew 1 tablet by mouth 2 (two) times daily as needed for indigestion or heartburn.   cholecalciferol 25 MCG (1000 UNIT) tablet Commonly known as: VITAMIN D3 Take 1,000 Units by mouth daily.   [employer] LUBRICATING/DRY EYE OP Place 1-2 drops into both eyes daily as needed (for dry eyes).   digoxin 0.125 MG tablet Commonly known as: LANOXIN TAKE 1 TABLET BY MOUTH DAILY   Eliquis 2.5 MG Tabs tablet Generic drug: apixaban TAKE 1 TABLET BY MOUTH TWICE A DAY What changed: how much to take   loperamide 2 MG capsule Commonly known as: IMODIUM Take 2 mg by mouth 3 (three) times daily as needed for diarrhea or loose stools.   metoprolol succinate 50 MG 24 hr tablet Commonly known as: TOPROL-XL Take 1.5 tablets (75 mg total) by mouth daily. Take with or immediately following a meal.   mirabegron ER 25 MG Tb24 tablet Commonly known as: Myrbetriq Take 1 tablet (25 mg total) by mouth daily.   phenylephrine 1 % nasal spray Commonly known as: NEO-SYNEPHRINE Place 1 drop into both nostrils every 6 (six) hours as needed for congestion.   potassium chloride SA 20 MEQ tablet Commonly known as: KLOR-CON Take 20 mEq by mouth daily.   predniSONE 20 MG tablet Commonly known as: DELTASONE Take 20 mg by mouth every other day.   predniSONE 10 MG tablet Commonly known as: DELTASONE Take 2 tablets (20 mg total) by mouth daily with breakfast.   sertraline 50 MG tablet Commonly known as: ZOLOFT Take 1 tablet (50 mg total) by mouth daily.   SYSTANE OP Place 1 drop into both eyes daily as needed (dry eyes).   tizanidine 2 MG capsule Commonly known as: ZANAFLEX Take 1 capsule (2 mg total) by  mouth every 6 (six) hours as needed for muscle spasms. What changed: when to take this   traMADol 50 MG tablet Commonly known as: ULTRAM Take 1-2 tablets (50-100 mg total) by mouth every 6 (six) hours as needed  for moderate pain or severe pain (post op pain). What changed:   how much to take  when to take this  reasons to take this   trolamine salicylate 10 % cream Commonly known as: ASPERCREME Apply 1 application topically at bedtime.   TYLENOL 500 MG tablet Generic drug: acetaminophen Take 1,000 mg by mouth 3 (three) times daily as needed for moderate pain or headache.            Durable Medical Equipment  (From admission, onward)         Start     Ordered   02/18/20 1415  DME Walker rolling  Once       Question:  Patient needs a walker to treat with the following condition  Answer:  Primary osteoarthritis of right knee   02/18/20 1414   02/18/20 1415  DME 3 n 1  Once        02/18/20 1414   02/18/20 1415  DME Bedside commode  Once       Question:  Patient needs a bedside commode to treat with the following condition  Answer:  Primary osteoarthritis of right knee   02/18/20 1414          Diagnostic Studies: No results found.  Disposition: Discharge disposition: 01-Home or Self Care       Discharge Instructions    Call MD / Call 911   Complete by: As directed    If you experience chest pain or shortness of breath, CALL 911 and be transported to the hospital emergency room.  If you develope a fever above 101 F, pus (white drainage) or increased drainage or redness at the wound, or calf pain, call your surgeon's office.   Call MD / Call 911   Complete by: As directed    If you experience chest pain or shortness of breath, CALL 911 and be transported to the hospital emergency room.  If you develope a fever above 101 F, pus (white drainage) or increased drainage or redness at the wound, or calf pain, call your surgeon's office.   Call MD / Call 911   Complete  by: As directed    If you experience chest pain or shortness of breath, CALL 911 and be transported to the hospital emergency room.  If you develope a fever above 101 F, pus (white drainage) or increased drainage or redness at the wound, or calf pain, call your surgeon's office.   Constipation Prevention   Complete by: As directed    Drink plenty of fluids.  Prune juice may be helpful.  You may use a stool softener, such as Colace (over the counter) 100 mg twice a day.  Use MiraLax (over the counter) for constipation as needed.   Constipation Prevention   Complete by: As directed    Drink plenty of fluids.  Prune juice may be helpful.  You may use a stool softener, such as Colace (over the counter) 100 mg twice a day.  Use MiraLax (over the counter) for constipation as needed.   Constipation Prevention   Complete by: As directed    Drink plenty of fluids.  Prune juice may be helpful.  You may use a stool softener, such as Colace (over the counter) 100 mg twice a day.  Use MiraLax (over the counter) for constipation as needed.   Diet - low sodium heart healthy   Complete by: As directed    Diet - low sodium heart healthy   Complete by:  As directed    Diet - low sodium heart healthy   Complete by: As directed    Discharge instructions   Complete by: As directed    INSTRUCTIONS AFTER JOINT REPLACEMENT   Remove items at home which could result in a fall. This includes throw rugs or furniture in walking pathways ICE to the affected joint every three hours while awake for 30 minutes at a time, for at least the first 3-5 days, and then as needed for pain and swelling.  Continue to use ice for pain and swelling. You may notice swelling that will progress down to the foot and ankle.  This is normal after surgery.  Elevate your leg when you are not up walking on it.   Continue to use the breathing machine you got in the hospital (incentive spirometer) which will help keep your temperature down.  It is  common for your temperature to cycle up and down following surgery, especially at night when you are not up moving around and exerting yourself.  The breathing machine keeps your lungs expanded and your temperature down.   DIET:  As you were doing prior to hospitalization, we recommend a well-balanced diet.  DRESSING / WOUND CARE / SHOWERING  You may shower 3 days after surgery, but keep the wounds dry during showering.  You may use an occlusive plastic wrap (Press'n Seal for example), NO SOAKING/SUBMERGING IN THE BATHTUB.  If the bandage gets wet, change with a clean dry gauze.  If the incision gets wet, pat the wound dry with a clean towel.  ACTIVITY  Increase activity slowly as tolerated, but follow the weight bearing instructions below.   No driving for 6 weeks or until further direction given by your physician.  You cannot drive while taking narcotics.  No lifting or carrying greater than 10 lbs. until further directed by your surgeon. Avoid periods of inactivity such as sitting longer than an hour when not asleep. This helps prevent blood clots.  You may return to work once you are authorized by your doctor.     WEIGHT BEARING   Weight bearing as tolerated with assist device (walker, cane, etc) as directed, use it as long as suggested by your surgeon or therapist, typically at least 4-6 weeks.   EXERCISES  Results after joint replacement surgery are often greatly improved when you follow the exercise, range of motion and muscle strengthening exercises prescribed by your doctor. Safety measures are also important to protect the joint from further injury. Any time any of these exercises cause you to have increased pain or swelling, decrease what you are doing until you are comfortable again and then slowly increase them. If you have problems or questions, call your caregiver or physical therapist for advice.   Rehabilitation is important following a joint replacement. After just a few  days of immobilization, the muscles of the leg can become weakened and shrink (atrophy).  These exercises are designed to build up the tone and strength of the thigh and leg muscles and to improve motion. Often times heat used for twenty to thirty minutes before working out will loosen up your tissues and help with improving the range of motion but do not use heat for the first two weeks following surgery (sometimes heat can increase post-operative swelling).   These exercises can be done on a training (exercise) mat, on the floor, on a table or on a bed. Use whatever works the best and is most comfortable for you.  Use music or television while you are exercising so that the exercises are a pleasant break in your day. This will make your life better with the exercises acting as a break in your routine that you can look forward to.   Perform all exercises about fifteen times, three times per day or as directed.  You should exercise both the operative leg and the other leg as well.  Exercises include:   Quad Sets - Tighten up the muscle on the front of the thigh (Quad) and hold for 5-10 seconds.   Straight Leg Raises - With your knee straight (if you were given a brace, keep it on), lift the leg to 60 degrees, hold for 3 seconds, and slowly lower the leg.  Perform this exercise against resistance later as your leg gets stronger.  Leg Slides: Lying on your back, slowly slide your foot toward your buttocks, bending your knee up off the floor (only go as far as is comfortable). Then slowly slide your foot back down until your leg is flat on the floor again.  Angel Wings: Lying on your back spread your legs to the side as far apart as you can without causing discomfort.  Hamstring Strength:  Lying on your back, push your heel against the floor with your leg straight by tightening up the muscles of your buttocks.  Repeat, but this time bend your knee to a comfortable angle, and push your heel against the  floor.  You may put a pillow under the heel to make it more comfortable if necessary.   A rehabilitation program following joint replacement surgery can speed recovery and prevent re-injury in the future due to weakened muscles. Contact your doctor or a physical therapist for more information on knee rehabilitation.    CONSTIPATION  Constipation is defined medically as fewer than three stools per week and severe constipation as less than one stool per week.  Even if you have a regular bowel pattern at home, your normal regimen is likely to be disrupted due to multiple reasons following surgery.  Combination of anesthesia, postoperative narcotics, change in appetite and fluid intake all can affect your bowels.   YOU MUST use at least one of the following options; they are listed in order of increasing strength to get the job done.  They are all available over the counter, and you may need to use some, POSSIBLY even all of these options:    Drink plenty of fluids (prune juice may be helpful) and high fiber foods Colace 100 mg by mouth twice a day  Senokot for constipation as directed and as needed Dulcolax (bisacodyl), take with full glass of water  Miralax (polyethylene glycol) once or twice a day as needed.  If you have tried all these things and are unable to have a bowel movement in the first 3-4 days after surgery call either your surgeon or your primary doctor.    If you experience loose stools or diarrhea, hold the medications until you stool forms back up.  If your symptoms do not get better within 1 week or if they get worse, check with your doctor.  If you experience "the worst abdominal pain ever" or develop nausea or vomiting, please contact the office immediately for further recommendations for treatment.   ITCHING:  If you experience itching with your medications, try taking only a single pain pill, or even half a pain pill at a time.  You can also use Benadryl over the counter for  itching or also to help with sleep.   TED HOSE STOCKINGS:  Use stockings on both legs until for at least 2 weeks or as directed by physician office. They may be removed at night for sleeping.  MEDICATIONS:  See your medication summary on the "After Visit Summary" that nursing will review with you.  You may have some home medications which will be placed on hold until you complete the course of blood thinner medication.  It is important for you to complete the blood thinner medication as prescribed.  PRECAUTIONS:  If you experience chest pain or shortness of breath - call 911 immediately for transfer to the hospital emergency department.   If you develop a fever greater that 101 F, purulent drainage from wound, increased redness or drainage from wound, foul odor from the wound/dressing, or calf pain - CONTACT YOUR SURGEON.                                                   FOLLOW-UP APPOINTMENTS:  If you do not already have a post-op appointment, please call the office for an appointment to be seen by your surgeon.  Guidelines for how soon to be seen are listed in your "After Visit Summary", but are typically between 1-4 weeks after surgery.  OTHER INSTRUCTIONS:   Knee Replacement:  Do not place pillow under knee, focus on keeping the knee straight while resting. CPM instructions: 0-90 degrees, 2 hours in the morning, 2 hours in the afternoon, and 2 hours in the evening. Place foam block, curve side up under heel at all times except when in CPM or when walking.  DO NOT modify, tear, cut, or change the foam block in any way.   DENTAL ANTIBIOTICS:  In most cases prophylactic antibiotics for Dental procdeures after total joint surgery are not necessary.  Exceptions are as follows:  1. History of prior total joint infection  2. Severely immunocompromised (Organ Transplant, cancer chemotherapy, Rheumatoid biologic meds such as Humera)  3. Poorly controlled diabetes (A1C &gt; 8.0, blood glucose  over 200)  If you have one of these conditions, contact your surgeon for an antibiotic prescription, prior to your dental procedure.   MAKE SURE YOU:  Understand these instructions.  Get help right away if you are not doing well or get worse.    Thank you for letting us be a part of your medical care team.  It is a privilege we respect greatly.  We hope these instructions will help you stay on track for a fast and full recovery!   Discharge instructions   Complete by: As directed    INSTRUCTIONS AFTER JOINT REPLACEMENT   Remove items at home which could result in a fall. This includes throw rugs or furniture in walking pathways ICE to the affected joint every three hours while awake for 30 minutes at a time, for at least the first 3-5 days, and then as needed for pain and swelling.  Continue to use ice for pain and swelling. You may notice swelling that will progress down to the foot and ankle.  This is normal after surgery.  Elevate your leg when you are not up walking on it.   Continue to use the breathing machine you got in the hospital (incentive spirometer) which will help keep your temperature down.  It is common for  your temperature to cycle up and down following surgery, especially at night when you are not up moving around and exerting yourself.  The breathing machine keeps your lungs expanded and your temperature down.   DIET:  As you were doing prior to hospitalization, we recommend a well-balanced diet.  DRESSING / WOUND CARE / SHOWERING  You may shower 3 days after surgery, but keep the wounds dry during showering.  You may use an occlusive plastic wrap (Press'n Seal for example), NO SOAKING/SUBMERGING IN THE BATHTUB.  If the bandage gets wet, change with a clean dry gauze.  If the incision gets wet, pat the wound dry with a clean towel.  ACTIVITY  Increase activity slowly as tolerated, but follow the weight bearing instructions below.   No driving for 6 weeks or until  further direction given by your physician.  You cannot drive while taking narcotics.  No lifting or carrying greater than 10 lbs. until further directed by your surgeon. Avoid periods of inactivity such as sitting longer than an hour when not asleep. This helps prevent blood clots.  You may return to work once you are authorized by your doctor.     WEIGHT BEARING   Weight bearing as tolerated with assist device (walker, cane, etc) as directed, use it as long as suggested by your surgeon or therapist, typically at least 4-6 weeks.   EXERCISES  Results after joint replacement surgery are often greatly improved when you follow the exercise, range of motion and muscle strengthening exercises prescribed by your doctor. Safety measures are also important to protect the joint from further injury. Any time any of these exercises cause you to have increased pain or swelling, decrease what you are doing until you are comfortable again and then slowly increase them. If you have problems or questions, call your caregiver or physical therapist for advice.   Rehabilitation is important following a joint replacement. After just a few days of immobilization, the muscles of the leg can become weakened and shrink (atrophy).  These exercises are designed to build up the tone and strength of the thigh and leg muscles and to improve motion. Often times heat used for twenty to thirty minutes before working out will loosen up your tissues and help with improving the range of motion but do not use heat for the first two weeks following surgery (sometimes heat can increase post-operative swelling).   These exercises can be done on a training (exercise) mat, on the floor, on a table or on a bed. Use whatever works the best and is most comfortable for you.    Use music or television while you are exercising so that the exercises are a pleasant break in your day. This will make your life better with the exercises acting as a  break in your routine that you can look forward to.   Perform all exercises about fifteen times, three times per day or as directed.  You should exercise both the operative leg and the other leg as well.  Exercises include:   Quad Sets - Tighten up the muscle on the front of the thigh (Quad) and hold for 5-10 seconds.   Straight Leg Raises - With your knee straight (if you were given a brace, keep it on), lift the leg to 60 degrees, hold for 3 seconds, and slowly lower the leg.  Perform this exercise against resistance later as your leg gets stronger.  Leg Slides: Lying on your back, slowly slide your foot  toward your buttocks, bending your knee up off the floor (only go as far as is comfortable). Then slowly slide your foot back down until your leg is flat on the floor again.  Angel Wings: Lying on your back spread your legs to the side as far apart as you can without causing discomfort.  Hamstring Strength:  Lying on your back, push your heel against the floor with your leg straight by tightening up the muscles of your buttocks.  Repeat, but this time bend your knee to a comfortable angle, and push your heel against the floor.  You may put a pillow under the heel to make it more comfortable if necessary.   A rehabilitation program following joint replacement surgery can speed recovery and prevent re-injury in the future due to weakened muscles. Contact your doctor or a physical therapist for more information on knee rehabilitation.    CONSTIPATION  Constipation is defined medically as fewer than three stools per week and severe constipation as less than one stool per week.  Even if you have a regular bowel pattern at home, your normal regimen is likely to be disrupted due to multiple reasons following surgery.  Combination of anesthesia, postoperative narcotics, change in appetite and fluid intake all can affect your bowels.   YOU MUST use at least one of the following options; they are listed in  order of increasing strength to get the job done.  They are all available over the counter, and you may need to use some, POSSIBLY even all of these options:    Drink plenty of fluids (prune juice may be helpful) and high fiber foods Colace 100 mg by mouth twice a day  Senokot for constipation as directed and as needed Dulcolax (bisacodyl), take with full glass of water  Miralax (polyethylene glycol) once or twice a day as needed.  If you have tried all these things and are unable to have a bowel movement in the first 3-4 days after surgery call either your surgeon or your primary doctor.    If you experience loose stools or diarrhea, hold the medications until you stool forms back up.  If your symptoms do not get better within 1 week or if they get worse, check with your doctor.  If you experience "the worst abdominal pain ever" or develop nausea or vomiting, please contact the office immediately for further recommendations for treatment.   ITCHING:  If you experience itching with your medications, try taking only a single pain pill, or even half a pain pill at a time.  You can also use Benadryl over the counter for itching or also to help with sleep.   TED HOSE STOCKINGS:  Use stockings on both legs until for at least 2 weeks or as directed by physician office. They may be removed at night for sleeping.  MEDICATIONS:  See your medication summary on the "After Visit Summary" that nursing will review with you.  You may have some home medications which will be placed on hold until you complete the course of blood thinner medication.  It is important for you to complete the blood thinner medication as prescribed.  PRECAUTIONS:  If you experience chest pain or shortness of breath - call 911 immediately for transfer to the hospital emergency department.   If you develop a fever greater that 101 F, purulent drainage from wound, increased redness or drainage from wound, foul odor from the  wound/dressing, or calf pain - CONTACT YOUR SURGEON.  FOLLOW-UP APPOINTMENTS:  If you do not already have a post-op appointment, please call the office for an appointment to be seen by your surgeon.  Guidelines for how soon to be seen are listed in your "After Visit Summary", but are typically between 1-4 weeks after surgery.  OTHER INSTRUCTIONS:   Knee Replacement:  Do not place pillow under knee, focus on keeping the knee straight while resting. CPM instructions: 0-90 degrees, 2 hours in the morning, 2 hours in the afternoon, and 2 hours in the evening. Place foam block, curve side up under heel at all times except when in CPM or when walking.  DO NOT modify, tear, cut, or change the foam block in any way.   DENTAL ANTIBIOTICS:  In most cases prophylactic antibiotics for Dental procdeures after total joint surgery are not necessary.  Exceptions are as follows:  1. History of prior total joint infection  2. Severely immunocompromised (Organ Transplant, cancer chemotherapy, Rheumatoid biologic meds such as Humera)  3. Poorly controlled diabetes (A1C &gt; 8.0, blood glucose over 200)  If you have one of these conditions, contact your surgeon for an antibiotic prescription, prior to your dental procedure.   MAKE SURE YOU:  Understand these instructions.  Get help right away if you are not doing well or get worse.    Thank you for letting us be a part of your medical care team.  It is a privilege we respect greatly.  We hope these instructions will help you stay on track for a fast and full recovery!   Discharge instructions   Complete by: As directed    INSTRUCTIONS AFTER JOINT REPLACEMENT   Remove items at home which could result in a fall. This includes throw rugs or furniture in walking pathways ICE to the affected joint every three hours while awake for 30 minutes at a time, for at least the first 3-5 days, and then as needed for  pain and swelling.  Continue to use ice for pain and swelling. You may notice swelling that will progress down to the foot and ankle.  This is normal after surgery.  Elevate your leg when you are not up walking on it.   Continue to use the breathing machine you got in the hospital (incentive spirometer) which will help keep your temperature down.  It is common for your temperature to cycle up and down following surgery, especially at night when you are not up moving around and exerting yourself.  The breathing machine keeps your lungs expanded and your temperature down.   DIET:  As you were doing prior to hospitalization, we recommend a well-balanced diet.  DRESSING / WOUND CARE / SHOWERING  You may shower 3 days after surgery, but keep the wounds dry during showering.  You may use an occlusive plastic wrap (Press'n Seal for example), NO SOAKING/SUBMERGING IN THE BATHTUB.  If the bandage gets wet, change with a clean dry gauze.  If the incision gets wet, pat the wound dry with a clean towel.  ACTIVITY  Increase activity slowly as tolerated, but follow the weight bearing instructions below.   No driving for 6 weeks or until further direction given by your physician.  You cannot drive while taking narcotics.  No lifting or carrying greater than 10 lbs. until further directed by your surgeon. Avoid periods of inactivity such as sitting longer than an hour when not asleep. This helps prevent blood clots.  You may return to work once you are authorized by your  doctor.     WEIGHT BEARING   Weight bearing as tolerated with assist device (walker, cane, etc) as directed, use it as long as suggested by your surgeon or therapist, typically at least 4-6 weeks.   EXERCISES  Results after joint replacement surgery are often greatly improved when you follow the exercise, range of motion and muscle strengthening exercises prescribed by your doctor. Safety measures are also important to protect the joint  from further injury. Any time any of these exercises cause you to have increased pain or swelling, decrease what you are doing until you are comfortable again and then slowly increase them. If you have problems or questions, call your caregiver or physical therapist for advice.   Rehabilitation is important following a joint replacement. After just a few days of immobilization, the muscles of the leg can become weakened and shrink (atrophy).  These exercises are designed to build up the tone and strength of the thigh and leg muscles and to improve motion. Often times heat used for twenty to thirty minutes before working out will loosen up your tissues and help with improving the range of motion but do not use heat for the first two weeks following surgery (sometimes heat can increase post-operative swelling).   These exercises can be done on a training (exercise) mat, on the floor, on a table or on a bed. Use whatever works the best and is most comfortable for you.    Use music or television while you are exercising so that the exercises are a pleasant break in your day. This will make your life better with the exercises acting as a break in your routine that you can look forward to.   Perform all exercises about fifteen times, three times per day or as directed.  You should exercise both the operative leg and the other leg as well.  Exercises include:   Quad Sets - Tighten up the muscle on the front of the thigh (Quad) and hold for 5-10 seconds.   Straight Leg Raises - With your knee straight (if you were given a brace, keep it on), lift the leg to 60 degrees, hold for 3 seconds, and slowly lower the leg.  Perform this exercise against resistance later as your leg gets stronger.  Leg Slides: Lying on your back, slowly slide your foot toward your buttocks, bending your knee up off the floor (only go as far as is comfortable). Then slowly slide your foot back down until your leg is flat on the floor again.   Angel Wings: Lying on your back spread your legs to the side as far apart as you can without causing discomfort.  Hamstring Strength:  Lying on your back, push your heel against the floor with your leg straight by tightening up the muscles of your buttocks.  Repeat, but this time bend your knee to a comfortable angle, and push your heel against the floor.  You may put a pillow under the heel to make it more comfortable if necessary.   A rehabilitation program following joint replacement surgery can speed recovery and prevent re-injury in the future due to weakened muscles. Contact your doctor or a physical therapist for more information on knee rehabilitation.    CONSTIPATION  Constipation is defined medically as fewer than three stools per week and severe constipation as less than one stool per week.  Even if you have a regular bowel pattern at home, your normal regimen is likely to be disrupted due to  multiple reasons following surgery.  Combination of anesthesia, postoperative narcotics, change in appetite and fluid intake all can affect your bowels.   YOU MUST use at least one of the following options; they are listed in order of increasing strength to get the job done.  They are all available over the counter, and you may need to use some, POSSIBLY even all of these options:    Drink plenty of fluids (prune juice may be helpful) and high fiber foods Colace 100 mg by mouth twice a day  Senokot for constipation as directed and as needed Dulcolax (bisacodyl), take with full glass of water  Miralax (polyethylene glycol) once or twice a day as needed.  If you have tried all these things and are unable to have a bowel movement in the first 3-4 days after surgery call either your surgeon or your primary doctor.    If you experience loose stools or diarrhea, hold the medications until you stool forms back up.  If your symptoms do not get better within 1 week or if they get worse, check with your  doctor.  If you experience "the worst abdominal pain ever" or develop nausea or vomiting, please contact the office immediately for further recommendations for treatment.   ITCHING:  If you experience itching with your medications, try taking only a single pain pill, or even half a pain pill at a time.  You can also use Benadryl over the counter for itching or also to help with sleep.   TED HOSE STOCKINGS:  Use stockings on both legs until for at least 2 weeks or as directed by physician office. They may be removed at night for sleeping.  MEDICATIONS:  See your medication summary on the "After Visit Summary" that nursing will review with you.  You may have some home medications which will be placed on hold until you complete the course of blood thinner medication.  It is important for you to complete the blood thinner medication as prescribed.  PRECAUTIONS:  If you experience chest pain or shortness of breath - call 911 immediately for transfer to the hospital emergency department.   If you develop a fever greater that 101 F, purulent drainage from wound, increased redness or drainage from wound, foul odor from the wound/dressing, or calf pain - CONTACT YOUR SURGEON.                                                   FOLLOW-UP APPOINTMENTS:  If you do not already have a post-op appointment, please call the office for an appointment to be seen by your surgeon.  Guidelines for how soon to be seen are listed in your "After Visit Summary", but are typically between 1-4 weeks after surgery.  OTHER INSTRUCTIONS:   Knee Replacement:  Do not place pillow under knee, focus on keeping the knee straight while resting. CPM instructions: 0-90 degrees, 2 hours in the morning, 2 hours in the afternoon, and 2 hours in the evening. Place foam block, curve side up under heel at all times except when in CPM or when walking.  DO NOT modify, tear, cut, or change the foam block in any way.   DENTAL ANTIBIOTICS:  In  most cases prophylactic antibiotics for Dental procdeures after total joint surgery are not necessary.  Exceptions are as follows:  1. History  of prior total joint infection  2. Severely immunocompromised (Organ Transplant, cancer chemotherapy, Rheumatoid biologic meds such as Humera)  3. Poorly controlled diabetes (A1C &gt; 8.0, blood glucose over 200)  If you have one of these conditions, contact your surgeon for an antibiotic prescription, prior to your dental procedure.   MAKE SURE YOU:  Understand these instructions.  Get help right away if you are not doing well or get worse.    Thank you for letting us be a part of your medical care team.  It is a privilege we respect greatly.  We hope these instructions will help you stay on track for a fast and full recovery!   Increase activity slowly as tolerated   Complete by: As directed    Increase activity slowly as tolerated   Complete by: As directed    Increase activity slowly as tolerated   Complete by: As directed        Follow-up Information    [name], MD. Go on 02/28/2020.   Specialty: Orthopedic Surgery Why: Your appointment has been made for 9:00 Contact information: [address] [place] [address] [phone]        [facility] Follow up.   Specialty: Home Health Services Why: Home Health PT will see you for 5 visits prior to you starting outpatient physical therapy  Contact information: [address] [place] [address] [phone]        [facility], [place]. Go on 02/28/2020.   Why: you will start Outpatient physical therapy after you MD appointment. they will call you with a time   Contact information: Physical Therapy [address] [place] [address] [phone]                Signed: Ginger Organ Tatjana Turcott 02/21/2020, 12:13 PM

## 2020-02-21 NOTE — Progress Notes (Addendum)
Subjective: 3 Days Post-Op Procedure(s) (LRB): RIGHT TOTAL KNEE ARTHROPLASTY (Right)   Patient feels a little tired and weak this morning. Yesterday she did not eat much as she states she wasn't very hungry. She is hoping to go home but is worried she wont pass therapy today.  Activity level:  wbat Diet tolerance:  Ok but not very hungry Voiding:  ok Patient reports pain as mild.    Objective: Vital signs in last 24 hours: Temp:  [98.2 F (36.8 C)-99.3 F (37.4 C)] 98.5 F (36.9 C) (11/12 0452) Pulse Rate:  [73-100] 80 (11/12 0452) Resp:  [16-20] 20 (11/12 0452) BP: (105-132)/(72-74) 105/73 (11/12 0452) SpO2:  [95 %] 95 % (11/12 0452)  Labs: Recent Labs    02/19/20 0329 02/20/20 0330 02/21/20 0315  HGB 11.5* 9.6* 9.6*   Recent Labs    02/20/20 0330 02/21/20 0315  WBC 9.0 10.8*  RBC 3.11* 3.08*  HCT 30.6* 30.2*  PLT 200 216   Recent Labs    02/19/20 0329  NA 132*  K 4.5  CL 100  CO2 26  BUN 17  CREATININE 0.81  GLUCOSE 150*  CALCIUM 8.7*   No results for input(s): LABPT, INR in the last 72 hours.  Physical Exam:  Neurologically intact ABD soft Neurovascular intact Sensation intact distally Intact pulses distally Dorsiflexion/Plantar flexion intact Incision: dressing C/D/I No cellulitis present Compartment soft  Assessment/Plan:  3 Days Post-Op Procedure(s) (LRB): RIGHT TOTAL KNEE ARTHROPLASTY (Right) Advance diet Up with therapy Discharge home with home health once cleared by PT. I encouraged her to try to eat more today but I also spoke with the nurse and we are going to get her some boost/ensure to try to get some calories in her.  I will check back mid day today to check on her progress.  Continue on home eliquis.   Ginger Organ Kenzlee Fishburn 02/21/2020, 8:09 AM   Addendum: Patient has now cleared PT and is feeling better. She will be discharged home this afternoon with HHPT.

## 2020-02-21 NOTE — Progress Notes (Signed)
Physical Therapy Treatment Patient Details Name: [name] MRN: [id number] DOB: [birth year] Today's Date: 02/21/2020    History of Present Illness 86 yo female s/p R TKA 02/18/2020. Hx of L TK revision 11/2019, L hip hemi 07/2019    PT Comments    Pt feeling much better and with noted improvement in stability and activity tolerance but pt continues to fatigue easily.  Pt up to [facility], ambulated short distance in hall and performed therex program with assist.  Pt and spouse state they feel ready to return home and that pt is close to base line for stability with ambulation.  Pt states that spouse was using gait belt with her prior to admission and that their dtr will stay the night and HHPT is set to arrive tomorrow.   Follow Up Recommendations  Follow surgeon's recommendation for DC plan and follow-up therapies;Supervision for mobility/OOB     Equipment Recommendations  None recommended by PT    Recommendations for Other Services       Precautions / Restrictions Precautions Precautions: Fall;Knee Required Braces or Orthoses: Knee Immobilizer - Right Knee Immobilizer - Right: On when out of bed or walking;Discontinue once straight leg raise with < 10 degree lag Restrictions Weight Bearing Restrictions: No Other Position/Activity Restrictions: WBAT    Mobility  Bed Mobility Overal bed mobility: Needs Assistance Bed Mobility: Supine to Sit     Supine to sit: Supervision     General bed mobility comments: Increased time but no physical assist  Transfers Overall transfer level: Needs assistance Equipment used: Rolling walker (2 wheeled) Transfers: Sit to/from [employer] Transfers Sit to Stand: Min guard;From elevated surface Stand pivot transfers: Min guard       General transfer comment: Steady assist with cues for LE management and use of UEs to self assist.  Stand/pvt transfer bed to [facility]  Ambulation/Gait Ambulation/Gait assistance: Min guard Gait Distance  (Feet): 34 Feet Assistive device: Rolling walker (2 wheeled) Gait Pattern/deviations: Step-to pattern;Decreased stance time - right;Antalgic Gait velocity: decr   General Gait Details: Cues for sequence, posture and position from RW; Distance ltd by pt c/o "weakness"    Stairs             Wheelchair Mobility    Modified Rankin (Stroke Patients Only)       Balance Overall balance assessment: Needs assistance Sitting-balance support: No upper extremity supported;Feet supported Sitting balance-Leahy Scale: Good     Standing balance support: Bilateral upper extremity supported Standing balance-Leahy Scale: Poor                              Cognition Arousal/Alertness: Awake/alert Behavior During Therapy: WFL for tasks assessed/performed Overall Cognitive Status: Within Functional Limits for tasks assessed                                        Exercises Total Joint Exercises Ankle Circles/Pumps: AROM;Both;20 reps;Supine Quad Sets: AROM;10 reps;Both;Supine Heel Slides: AAROM;Right;Supine;10 reps Hip ABduction/ADduction: AROM;Right;10 reps;AAROM Straight Leg Raises: AAROM;Right;10 reps;Supine Goniometric ROM: -5 - 95 AAROM at R knee    General Comments        Pertinent Vitals/Pain Pain Assessment: 0-10 Pain Score: 3  Pain Location: R thigh Pain Descriptors / Indicators: Discomfort;Grimacing;Guarding;Aching;Sore Pain Intervention(s): Limited activity within patient's tolerance;Monitored during session;Premedicated before session;Ice applied    Home Living  Prior Function            PT Goals (current goals can now be found in the care plan section) Acute Rehab PT Goals Patient Stated Goal: regain strength in LEs. PT Goal Formulation: With patient Time For Goal Achievement: 03/03/20 Potential to Achieve Goals: Good Progress towards PT goals: Progressing toward goals    Frequency     7X/week      PT Plan Current plan remains appropriate    Co-evaluation              AM-PAC PT "6 Clicks" Mobility   Outcome Measure  Help needed turning from your back to your side while in a flat bed without using bedrails?: A Little Help needed moving from lying on your back to sitting on the side of a flat bed without using bedrails?: A Little Help needed moving to and from a bed to a chair (including a wheelchair)?: A Little Help needed standing up from a chair using your arms (e.g., wheelchair or bedside chair)?: A Little Help needed to walk in hospital room?: A Little Help needed climbing 3-5 steps with a railing? : A Lot 6 Click Score: 17    End of Session Equipment Utilized During Treatment: Gait belt Activity Tolerance: Patient tolerated treatment well Patient left: in chair;with call bell/phone within reach;with family/visitor present Nurse Communication: Mobility status PT Visit Diagnosis: Pain;Other abnormalities of gait and mobility (R26.89) Pain - Right/Left: Right Pain - part of body: Knee     Time: 1134-1206 PT Time Calculation (min) (ACUTE ONLY): 32 min  Charges:  $Gait Training: 8-22 mins $Therapeutic Exercise: 8-22 mins                     .   Jini Horiuchi 02/21/2020, 12:45 PM

## 2020-02-21 NOTE — Plan of Care (Signed)
Plan of care reviewed and discussed with the patient. 

## 2020-02-22 DIAGNOSIS — I272 Pulmonary hypertension, unspecified: Secondary | ICD-10-CM | POA: Diagnosis not present

## 2020-02-22 DIAGNOSIS — I252 Old myocardial infarction: Secondary | ICD-10-CM | POA: Diagnosis not present

## 2020-02-22 DIAGNOSIS — I4891 Unspecified atrial fibrillation: Secondary | ICD-10-CM | POA: Diagnosis not present

## 2020-02-22 DIAGNOSIS — K5730 Diverticulosis of large intestine without perforation or abscess without bleeding: Secondary | ICD-10-CM | POA: Diagnosis not present

## 2020-02-22 DIAGNOSIS — K580 Irritable bowel syndrome with diarrhea: Secondary | ICD-10-CM | POA: Diagnosis not present

## 2020-02-22 DIAGNOSIS — D649 Anemia, unspecified: Secondary | ICD-10-CM | POA: Diagnosis not present

## 2020-02-22 DIAGNOSIS — Z471 Aftercare following joint replacement surgery: Secondary | ICD-10-CM | POA: Diagnosis not present

## 2020-02-22 DIAGNOSIS — Z96642 Presence of left artificial hip joint: Secondary | ICD-10-CM | POA: Diagnosis not present

## 2020-02-22 DIAGNOSIS — F32A Depression, unspecified: Secondary | ICD-10-CM | POA: Diagnosis not present

## 2020-02-22 DIAGNOSIS — I428 Other cardiomyopathies: Secondary | ICD-10-CM | POA: Diagnosis not present

## 2020-02-22 DIAGNOSIS — M810 Age-related osteoporosis without current pathological fracture: Secondary | ICD-10-CM | POA: Diagnosis not present

## 2020-02-22 DIAGNOSIS — Z96653 Presence of artificial knee joint, bilateral: Secondary | ICD-10-CM | POA: Diagnosis not present

## 2020-02-22 DIAGNOSIS — I5043 Acute on chronic combined systolic (congestive) and diastolic (congestive) heart failure: Secondary | ICD-10-CM | POA: Diagnosis not present

## 2020-02-22 DIAGNOSIS — I447 Left bundle-branch block, unspecified: Secondary | ICD-10-CM | POA: Diagnosis not present

## 2020-02-22 DIAGNOSIS — Z7901 Long term (current) use of anticoagulants: Secondary | ICD-10-CM | POA: Diagnosis not present

## 2020-02-22 DIAGNOSIS — I071 Rheumatic tricuspid insufficiency: Secondary | ICD-10-CM | POA: Diagnosis not present

## 2020-02-22 DIAGNOSIS — M5431 Sciatica, right side: Secondary | ICD-10-CM | POA: Diagnosis not present

## 2020-02-22 DIAGNOSIS — K219 Gastro-esophageal reflux disease without esophagitis: Secondary | ICD-10-CM | POA: Diagnosis not present

## 2020-02-22 DIAGNOSIS — E559 Vitamin D deficiency, unspecified: Secondary | ICD-10-CM | POA: Diagnosis not present

## 2020-02-22 DIAGNOSIS — I110 Hypertensive heart disease with heart failure: Secondary | ICD-10-CM | POA: Diagnosis not present

## 2020-02-24 DIAGNOSIS — Z96642 Presence of left artificial hip joint: Secondary | ICD-10-CM | POA: Diagnosis not present

## 2020-02-24 DIAGNOSIS — I272 Pulmonary hypertension, unspecified: Secondary | ICD-10-CM | POA: Diagnosis not present

## 2020-02-24 DIAGNOSIS — Z7901 Long term (current) use of anticoagulants: Secondary | ICD-10-CM | POA: Diagnosis not present

## 2020-02-24 DIAGNOSIS — I428 Other cardiomyopathies: Secondary | ICD-10-CM | POA: Diagnosis not present

## 2020-02-24 DIAGNOSIS — K5730 Diverticulosis of large intestine without perforation or abscess without bleeding: Secondary | ICD-10-CM | POA: Diagnosis not present

## 2020-02-24 DIAGNOSIS — M810 Age-related osteoporosis without current pathological fracture: Secondary | ICD-10-CM | POA: Diagnosis not present

## 2020-02-24 DIAGNOSIS — I110 Hypertensive heart disease with heart failure: Secondary | ICD-10-CM | POA: Diagnosis not present

## 2020-02-24 DIAGNOSIS — I252 Old myocardial infarction: Secondary | ICD-10-CM | POA: Diagnosis not present

## 2020-02-24 DIAGNOSIS — I447 Left bundle-branch block, unspecified: Secondary | ICD-10-CM | POA: Diagnosis not present

## 2020-02-24 DIAGNOSIS — I4891 Unspecified atrial fibrillation: Secondary | ICD-10-CM | POA: Diagnosis not present

## 2020-02-24 DIAGNOSIS — Z96653 Presence of artificial knee joint, bilateral: Secondary | ICD-10-CM | POA: Diagnosis not present

## 2020-02-24 DIAGNOSIS — F32A Depression, unspecified: Secondary | ICD-10-CM | POA: Diagnosis not present

## 2020-02-24 DIAGNOSIS — I5043 Acute on chronic combined systolic (congestive) and diastolic (congestive) heart failure: Secondary | ICD-10-CM | POA: Diagnosis not present

## 2020-02-24 DIAGNOSIS — E559 Vitamin D deficiency, unspecified: Secondary | ICD-10-CM | POA: Diagnosis not present

## 2020-02-24 DIAGNOSIS — K580 Irritable bowel syndrome with diarrhea: Secondary | ICD-10-CM | POA: Diagnosis not present

## 2020-02-24 DIAGNOSIS — Z471 Aftercare following joint replacement surgery: Secondary | ICD-10-CM | POA: Diagnosis not present

## 2020-02-24 DIAGNOSIS — I071 Rheumatic tricuspid insufficiency: Secondary | ICD-10-CM | POA: Diagnosis not present

## 2020-02-24 DIAGNOSIS — K219 Gastro-esophageal reflux disease without esophagitis: Secondary | ICD-10-CM | POA: Diagnosis not present

## 2020-02-24 DIAGNOSIS — D649 Anemia, unspecified: Secondary | ICD-10-CM | POA: Diagnosis not present

## 2020-02-24 DIAGNOSIS — M5431 Sciatica, right side: Secondary | ICD-10-CM | POA: Diagnosis not present

## 2020-02-25 DIAGNOSIS — D649 Anemia, unspecified: Secondary | ICD-10-CM | POA: Diagnosis not present

## 2020-02-25 DIAGNOSIS — I272 Pulmonary hypertension, unspecified: Secondary | ICD-10-CM | POA: Diagnosis not present

## 2020-02-25 DIAGNOSIS — I447 Left bundle-branch block, unspecified: Secondary | ICD-10-CM | POA: Diagnosis not present

## 2020-02-25 DIAGNOSIS — I252 Old myocardial infarction: Secondary | ICD-10-CM | POA: Diagnosis not present

## 2020-02-25 DIAGNOSIS — M810 Age-related osteoporosis without current pathological fracture: Secondary | ICD-10-CM | POA: Diagnosis not present

## 2020-02-25 DIAGNOSIS — I5043 Acute on chronic combined systolic (congestive) and diastolic (congestive) heart failure: Secondary | ICD-10-CM | POA: Diagnosis not present

## 2020-02-25 DIAGNOSIS — Z471 Aftercare following joint replacement surgery: Secondary | ICD-10-CM | POA: Diagnosis not present

## 2020-02-25 DIAGNOSIS — K219 Gastro-esophageal reflux disease without esophagitis: Secondary | ICD-10-CM | POA: Diagnosis not present

## 2020-02-25 DIAGNOSIS — I110 Hypertensive heart disease with heart failure: Secondary | ICD-10-CM | POA: Diagnosis not present

## 2020-02-25 DIAGNOSIS — Z96653 Presence of artificial knee joint, bilateral: Secondary | ICD-10-CM | POA: Diagnosis not present

## 2020-02-25 DIAGNOSIS — K5730 Diverticulosis of large intestine without perforation or abscess without bleeding: Secondary | ICD-10-CM | POA: Diagnosis not present

## 2020-02-25 DIAGNOSIS — Z7901 Long term (current) use of anticoagulants: Secondary | ICD-10-CM | POA: Diagnosis not present

## 2020-02-25 DIAGNOSIS — M5431 Sciatica, right side: Secondary | ICD-10-CM | POA: Diagnosis not present

## 2020-02-25 DIAGNOSIS — I4891 Unspecified atrial fibrillation: Secondary | ICD-10-CM | POA: Diagnosis not present

## 2020-02-25 DIAGNOSIS — E559 Vitamin D deficiency, unspecified: Secondary | ICD-10-CM | POA: Diagnosis not present

## 2020-02-25 DIAGNOSIS — I428 Other cardiomyopathies: Secondary | ICD-10-CM | POA: Diagnosis not present

## 2020-02-25 DIAGNOSIS — Z96642 Presence of left artificial hip joint: Secondary | ICD-10-CM | POA: Diagnosis not present

## 2020-02-25 DIAGNOSIS — K580 Irritable bowel syndrome with diarrhea: Secondary | ICD-10-CM | POA: Diagnosis not present

## 2020-02-25 DIAGNOSIS — I071 Rheumatic tricuspid insufficiency: Secondary | ICD-10-CM | POA: Diagnosis not present

## 2020-02-25 DIAGNOSIS — F32A Depression, unspecified: Secondary | ICD-10-CM | POA: Diagnosis not present

## 2020-02-26 DIAGNOSIS — D649 Anemia, unspecified: Secondary | ICD-10-CM | POA: Diagnosis not present

## 2020-02-26 DIAGNOSIS — Z96642 Presence of left artificial hip joint: Secondary | ICD-10-CM | POA: Diagnosis not present

## 2020-02-26 DIAGNOSIS — M810 Age-related osteoporosis without current pathological fracture: Secondary | ICD-10-CM | POA: Diagnosis not present

## 2020-02-26 DIAGNOSIS — F32A Depression, unspecified: Secondary | ICD-10-CM | POA: Diagnosis not present

## 2020-02-26 DIAGNOSIS — Z7901 Long term (current) use of anticoagulants: Secondary | ICD-10-CM | POA: Diagnosis not present

## 2020-02-26 DIAGNOSIS — I4891 Unspecified atrial fibrillation: Secondary | ICD-10-CM | POA: Diagnosis not present

## 2020-02-26 DIAGNOSIS — I447 Left bundle-branch block, unspecified: Secondary | ICD-10-CM | POA: Diagnosis not present

## 2020-02-26 DIAGNOSIS — I272 Pulmonary hypertension, unspecified: Secondary | ICD-10-CM | POA: Diagnosis not present

## 2020-02-26 DIAGNOSIS — Z471 Aftercare following joint replacement surgery: Secondary | ICD-10-CM | POA: Diagnosis not present

## 2020-02-26 DIAGNOSIS — I071 Rheumatic tricuspid insufficiency: Secondary | ICD-10-CM | POA: Diagnosis not present

## 2020-02-26 DIAGNOSIS — I5043 Acute on chronic combined systolic (congestive) and diastolic (congestive) heart failure: Secondary | ICD-10-CM | POA: Diagnosis not present

## 2020-02-26 DIAGNOSIS — E559 Vitamin D deficiency, unspecified: Secondary | ICD-10-CM | POA: Diagnosis not present

## 2020-02-26 DIAGNOSIS — I110 Hypertensive heart disease with heart failure: Secondary | ICD-10-CM | POA: Diagnosis not present

## 2020-02-26 DIAGNOSIS — I252 Old myocardial infarction: Secondary | ICD-10-CM | POA: Diagnosis not present

## 2020-02-26 DIAGNOSIS — K580 Irritable bowel syndrome with diarrhea: Secondary | ICD-10-CM | POA: Diagnosis not present

## 2020-02-26 DIAGNOSIS — I428 Other cardiomyopathies: Secondary | ICD-10-CM | POA: Diagnosis not present

## 2020-02-26 DIAGNOSIS — M5431 Sciatica, right side: Secondary | ICD-10-CM | POA: Diagnosis not present

## 2020-02-26 DIAGNOSIS — K219 Gastro-esophageal reflux disease without esophagitis: Secondary | ICD-10-CM | POA: Diagnosis not present

## 2020-02-26 DIAGNOSIS — K5730 Diverticulosis of large intestine without perforation or abscess without bleeding: Secondary | ICD-10-CM | POA: Diagnosis not present

## 2020-02-26 DIAGNOSIS — Z96653 Presence of artificial knee joint, bilateral: Secondary | ICD-10-CM | POA: Diagnosis not present

## 2020-02-27 DIAGNOSIS — K219 Gastro-esophageal reflux disease without esophagitis: Secondary | ICD-10-CM | POA: Diagnosis not present

## 2020-02-27 DIAGNOSIS — M810 Age-related osteoporosis without current pathological fracture: Secondary | ICD-10-CM | POA: Diagnosis not present

## 2020-02-27 DIAGNOSIS — Z471 Aftercare following joint replacement surgery: Secondary | ICD-10-CM | POA: Diagnosis not present

## 2020-02-27 DIAGNOSIS — I252 Old myocardial infarction: Secondary | ICD-10-CM | POA: Diagnosis not present

## 2020-02-27 DIAGNOSIS — Z96642 Presence of left artificial hip joint: Secondary | ICD-10-CM | POA: Diagnosis not present

## 2020-02-27 DIAGNOSIS — I428 Other cardiomyopathies: Secondary | ICD-10-CM | POA: Diagnosis not present

## 2020-02-27 DIAGNOSIS — I4891 Unspecified atrial fibrillation: Secondary | ICD-10-CM | POA: Diagnosis not present

## 2020-02-27 DIAGNOSIS — I5043 Acute on chronic combined systolic (congestive) and diastolic (congestive) heart failure: Secondary | ICD-10-CM | POA: Diagnosis not present

## 2020-02-27 DIAGNOSIS — F32A Depression, unspecified: Secondary | ICD-10-CM | POA: Diagnosis not present

## 2020-02-27 DIAGNOSIS — I272 Pulmonary hypertension, unspecified: Secondary | ICD-10-CM | POA: Diagnosis not present

## 2020-02-27 DIAGNOSIS — D649 Anemia, unspecified: Secondary | ICD-10-CM | POA: Diagnosis not present

## 2020-02-27 DIAGNOSIS — Z96653 Presence of artificial knee joint, bilateral: Secondary | ICD-10-CM | POA: Diagnosis not present

## 2020-02-27 DIAGNOSIS — E559 Vitamin D deficiency, unspecified: Secondary | ICD-10-CM | POA: Diagnosis not present

## 2020-02-27 DIAGNOSIS — I110 Hypertensive heart disease with heart failure: Secondary | ICD-10-CM | POA: Diagnosis not present

## 2020-02-27 DIAGNOSIS — I071 Rheumatic tricuspid insufficiency: Secondary | ICD-10-CM | POA: Diagnosis not present

## 2020-02-27 DIAGNOSIS — K5730 Diverticulosis of large intestine without perforation or abscess without bleeding: Secondary | ICD-10-CM | POA: Diagnosis not present

## 2020-02-27 DIAGNOSIS — M5431 Sciatica, right side: Secondary | ICD-10-CM | POA: Diagnosis not present

## 2020-02-27 DIAGNOSIS — I447 Left bundle-branch block, unspecified: Secondary | ICD-10-CM | POA: Diagnosis not present

## 2020-02-27 DIAGNOSIS — Z7901 Long term (current) use of anticoagulants: Secondary | ICD-10-CM | POA: Diagnosis not present

## 2020-02-27 DIAGNOSIS — K580 Irritable bowel syndrome with diarrhea: Secondary | ICD-10-CM | POA: Diagnosis not present

## 2020-02-28 DIAGNOSIS — Z9889 Other specified postprocedural states: Secondary | ICD-10-CM | POA: Diagnosis not present

## 2020-03-02 DIAGNOSIS — I071 Rheumatic tricuspid insufficiency: Secondary | ICD-10-CM | POA: Diagnosis not present

## 2020-03-02 DIAGNOSIS — Z471 Aftercare following joint replacement surgery: Secondary | ICD-10-CM | POA: Diagnosis not present

## 2020-03-02 DIAGNOSIS — I252 Old myocardial infarction: Secondary | ICD-10-CM | POA: Diagnosis not present

## 2020-03-02 DIAGNOSIS — K5730 Diverticulosis of large intestine without perforation or abscess without bleeding: Secondary | ICD-10-CM | POA: Diagnosis not present

## 2020-03-02 DIAGNOSIS — D649 Anemia, unspecified: Secondary | ICD-10-CM | POA: Diagnosis not present

## 2020-03-02 DIAGNOSIS — I272 Pulmonary hypertension, unspecified: Secondary | ICD-10-CM | POA: Diagnosis not present

## 2020-03-02 DIAGNOSIS — I110 Hypertensive heart disease with heart failure: Secondary | ICD-10-CM | POA: Diagnosis not present

## 2020-03-02 DIAGNOSIS — K219 Gastro-esophageal reflux disease without esophagitis: Secondary | ICD-10-CM | POA: Diagnosis not present

## 2020-03-02 DIAGNOSIS — F32A Depression, unspecified: Secondary | ICD-10-CM | POA: Diagnosis not present

## 2020-03-02 DIAGNOSIS — Z96653 Presence of artificial knee joint, bilateral: Secondary | ICD-10-CM | POA: Diagnosis not present

## 2020-03-02 DIAGNOSIS — I447 Left bundle-branch block, unspecified: Secondary | ICD-10-CM | POA: Diagnosis not present

## 2020-03-02 DIAGNOSIS — K580 Irritable bowel syndrome with diarrhea: Secondary | ICD-10-CM | POA: Diagnosis not present

## 2020-03-02 DIAGNOSIS — I428 Other cardiomyopathies: Secondary | ICD-10-CM | POA: Diagnosis not present

## 2020-03-02 DIAGNOSIS — E559 Vitamin D deficiency, unspecified: Secondary | ICD-10-CM | POA: Diagnosis not present

## 2020-03-02 DIAGNOSIS — Z7901 Long term (current) use of anticoagulants: Secondary | ICD-10-CM | POA: Diagnosis not present

## 2020-03-02 DIAGNOSIS — I4891 Unspecified atrial fibrillation: Secondary | ICD-10-CM | POA: Diagnosis not present

## 2020-03-02 DIAGNOSIS — M5431 Sciatica, right side: Secondary | ICD-10-CM | POA: Diagnosis not present

## 2020-03-02 DIAGNOSIS — Z96642 Presence of left artificial hip joint: Secondary | ICD-10-CM | POA: Diagnosis not present

## 2020-03-02 DIAGNOSIS — M810 Age-related osteoporosis without current pathological fracture: Secondary | ICD-10-CM | POA: Diagnosis not present

## 2020-03-02 DIAGNOSIS — I5043 Acute on chronic combined systolic (congestive) and diastolic (congestive) heart failure: Secondary | ICD-10-CM | POA: Diagnosis not present

## 2020-03-04 DIAGNOSIS — I4891 Unspecified atrial fibrillation: Secondary | ICD-10-CM | POA: Diagnosis not present

## 2020-03-04 DIAGNOSIS — E559 Vitamin D deficiency, unspecified: Secondary | ICD-10-CM | POA: Diagnosis not present

## 2020-03-04 DIAGNOSIS — I5043 Acute on chronic combined systolic (congestive) and diastolic (congestive) heart failure: Secondary | ICD-10-CM | POA: Diagnosis not present

## 2020-03-04 DIAGNOSIS — M810 Age-related osteoporosis without current pathological fracture: Secondary | ICD-10-CM | POA: Diagnosis not present

## 2020-03-04 DIAGNOSIS — Z96653 Presence of artificial knee joint, bilateral: Secondary | ICD-10-CM | POA: Diagnosis not present

## 2020-03-04 DIAGNOSIS — D649 Anemia, unspecified: Secondary | ICD-10-CM | POA: Diagnosis not present

## 2020-03-04 DIAGNOSIS — F32A Depression, unspecified: Secondary | ICD-10-CM | POA: Diagnosis not present

## 2020-03-04 DIAGNOSIS — Z96642 Presence of left artificial hip joint: Secondary | ICD-10-CM | POA: Diagnosis not present

## 2020-03-04 DIAGNOSIS — K5730 Diverticulosis of large intestine without perforation or abscess without bleeding: Secondary | ICD-10-CM | POA: Diagnosis not present

## 2020-03-04 DIAGNOSIS — I447 Left bundle-branch block, unspecified: Secondary | ICD-10-CM | POA: Diagnosis not present

## 2020-03-04 DIAGNOSIS — I428 Other cardiomyopathies: Secondary | ICD-10-CM | POA: Diagnosis not present

## 2020-03-04 DIAGNOSIS — Z7901 Long term (current) use of anticoagulants: Secondary | ICD-10-CM | POA: Diagnosis not present

## 2020-03-04 DIAGNOSIS — I110 Hypertensive heart disease with heart failure: Secondary | ICD-10-CM | POA: Diagnosis not present

## 2020-03-04 DIAGNOSIS — M5431 Sciatica, right side: Secondary | ICD-10-CM | POA: Diagnosis not present

## 2020-03-04 DIAGNOSIS — I272 Pulmonary hypertension, unspecified: Secondary | ICD-10-CM | POA: Diagnosis not present

## 2020-03-04 DIAGNOSIS — K219 Gastro-esophageal reflux disease without esophagitis: Secondary | ICD-10-CM | POA: Diagnosis not present

## 2020-03-04 DIAGNOSIS — Z471 Aftercare following joint replacement surgery: Secondary | ICD-10-CM | POA: Diagnosis not present

## 2020-03-04 DIAGNOSIS — I071 Rheumatic tricuspid insufficiency: Secondary | ICD-10-CM | POA: Diagnosis not present

## 2020-03-04 DIAGNOSIS — I252 Old myocardial infarction: Secondary | ICD-10-CM | POA: Diagnosis not present

## 2020-03-04 DIAGNOSIS — K580 Irritable bowel syndrome with diarrhea: Secondary | ICD-10-CM | POA: Diagnosis not present

## 2020-03-06 DIAGNOSIS — I5043 Acute on chronic combined systolic (congestive) and diastolic (congestive) heart failure: Secondary | ICD-10-CM | POA: Diagnosis not present

## 2020-03-06 DIAGNOSIS — Z471 Aftercare following joint replacement surgery: Secondary | ICD-10-CM | POA: Diagnosis not present

## 2020-03-06 DIAGNOSIS — D649 Anemia, unspecified: Secondary | ICD-10-CM | POA: Diagnosis not present

## 2020-03-06 DIAGNOSIS — I252 Old myocardial infarction: Secondary | ICD-10-CM | POA: Diagnosis not present

## 2020-03-06 DIAGNOSIS — I428 Other cardiomyopathies: Secondary | ICD-10-CM | POA: Diagnosis not present

## 2020-03-06 DIAGNOSIS — I272 Pulmonary hypertension, unspecified: Secondary | ICD-10-CM | POA: Diagnosis not present

## 2020-03-06 DIAGNOSIS — M810 Age-related osteoporosis without current pathological fracture: Secondary | ICD-10-CM | POA: Diagnosis not present

## 2020-03-06 DIAGNOSIS — I110 Hypertensive heart disease with heart failure: Secondary | ICD-10-CM | POA: Diagnosis not present

## 2020-03-06 DIAGNOSIS — K580 Irritable bowel syndrome with diarrhea: Secondary | ICD-10-CM | POA: Diagnosis not present

## 2020-03-06 DIAGNOSIS — I071 Rheumatic tricuspid insufficiency: Secondary | ICD-10-CM | POA: Diagnosis not present

## 2020-03-06 DIAGNOSIS — K5730 Diverticulosis of large intestine without perforation or abscess without bleeding: Secondary | ICD-10-CM | POA: Diagnosis not present

## 2020-03-06 DIAGNOSIS — F32A Depression, unspecified: Secondary | ICD-10-CM | POA: Diagnosis not present

## 2020-03-06 DIAGNOSIS — Z96642 Presence of left artificial hip joint: Secondary | ICD-10-CM | POA: Diagnosis not present

## 2020-03-06 DIAGNOSIS — Z7901 Long term (current) use of anticoagulants: Secondary | ICD-10-CM | POA: Diagnosis not present

## 2020-03-06 DIAGNOSIS — I4891 Unspecified atrial fibrillation: Secondary | ICD-10-CM | POA: Diagnosis not present

## 2020-03-06 DIAGNOSIS — Z96653 Presence of artificial knee joint, bilateral: Secondary | ICD-10-CM | POA: Diagnosis not present

## 2020-03-06 DIAGNOSIS — M5431 Sciatica, right side: Secondary | ICD-10-CM | POA: Diagnosis not present

## 2020-03-06 DIAGNOSIS — I447 Left bundle-branch block, unspecified: Secondary | ICD-10-CM | POA: Diagnosis not present

## 2020-03-06 DIAGNOSIS — K219 Gastro-esophageal reflux disease without esophagitis: Secondary | ICD-10-CM | POA: Diagnosis not present

## 2020-03-06 DIAGNOSIS — E559 Vitamin D deficiency, unspecified: Secondary | ICD-10-CM | POA: Diagnosis not present

## 2020-03-09 DIAGNOSIS — F32A Depression, unspecified: Secondary | ICD-10-CM | POA: Diagnosis not present

## 2020-03-09 DIAGNOSIS — Z96642 Presence of left artificial hip joint: Secondary | ICD-10-CM | POA: Diagnosis not present

## 2020-03-09 DIAGNOSIS — M5431 Sciatica, right side: Secondary | ICD-10-CM | POA: Diagnosis not present

## 2020-03-09 DIAGNOSIS — K580 Irritable bowel syndrome with diarrhea: Secondary | ICD-10-CM | POA: Diagnosis not present

## 2020-03-09 DIAGNOSIS — I4891 Unspecified atrial fibrillation: Secondary | ICD-10-CM | POA: Diagnosis not present

## 2020-03-09 DIAGNOSIS — K5730 Diverticulosis of large intestine without perforation or abscess without bleeding: Secondary | ICD-10-CM | POA: Diagnosis not present

## 2020-03-09 DIAGNOSIS — I252 Old myocardial infarction: Secondary | ICD-10-CM | POA: Diagnosis not present

## 2020-03-09 DIAGNOSIS — M810 Age-related osteoporosis without current pathological fracture: Secondary | ICD-10-CM | POA: Diagnosis not present

## 2020-03-09 DIAGNOSIS — I5043 Acute on chronic combined systolic (congestive) and diastolic (congestive) heart failure: Secondary | ICD-10-CM | POA: Diagnosis not present

## 2020-03-09 DIAGNOSIS — I447 Left bundle-branch block, unspecified: Secondary | ICD-10-CM | POA: Diagnosis not present

## 2020-03-09 DIAGNOSIS — I272 Pulmonary hypertension, unspecified: Secondary | ICD-10-CM | POA: Diagnosis not present

## 2020-03-09 DIAGNOSIS — I110 Hypertensive heart disease with heart failure: Secondary | ICD-10-CM | POA: Diagnosis not present

## 2020-03-09 DIAGNOSIS — I428 Other cardiomyopathies: Secondary | ICD-10-CM | POA: Diagnosis not present

## 2020-03-09 DIAGNOSIS — I071 Rheumatic tricuspid insufficiency: Secondary | ICD-10-CM | POA: Diagnosis not present

## 2020-03-09 DIAGNOSIS — Z7901 Long term (current) use of anticoagulants: Secondary | ICD-10-CM | POA: Diagnosis not present

## 2020-03-09 DIAGNOSIS — E559 Vitamin D deficiency, unspecified: Secondary | ICD-10-CM | POA: Diagnosis not present

## 2020-03-09 DIAGNOSIS — Z471 Aftercare following joint replacement surgery: Secondary | ICD-10-CM | POA: Diagnosis not present

## 2020-03-09 DIAGNOSIS — Z96653 Presence of artificial knee joint, bilateral: Secondary | ICD-10-CM | POA: Diagnosis not present

## 2020-03-09 DIAGNOSIS — K219 Gastro-esophageal reflux disease without esophagitis: Secondary | ICD-10-CM | POA: Diagnosis not present

## 2020-03-09 DIAGNOSIS — D649 Anemia, unspecified: Secondary | ICD-10-CM | POA: Diagnosis not present

## 2020-03-11 DIAGNOSIS — Z96642 Presence of left artificial hip joint: Secondary | ICD-10-CM | POA: Diagnosis not present

## 2020-03-11 DIAGNOSIS — M5431 Sciatica, right side: Secondary | ICD-10-CM | POA: Diagnosis not present

## 2020-03-11 DIAGNOSIS — I447 Left bundle-branch block, unspecified: Secondary | ICD-10-CM | POA: Diagnosis not present

## 2020-03-11 DIAGNOSIS — Z7901 Long term (current) use of anticoagulants: Secondary | ICD-10-CM | POA: Diagnosis not present

## 2020-03-11 DIAGNOSIS — F32A Depression, unspecified: Secondary | ICD-10-CM | POA: Diagnosis not present

## 2020-03-11 DIAGNOSIS — I428 Other cardiomyopathies: Secondary | ICD-10-CM | POA: Diagnosis not present

## 2020-03-11 DIAGNOSIS — I110 Hypertensive heart disease with heart failure: Secondary | ICD-10-CM | POA: Diagnosis not present

## 2020-03-11 DIAGNOSIS — K5730 Diverticulosis of large intestine without perforation or abscess without bleeding: Secondary | ICD-10-CM | POA: Diagnosis not present

## 2020-03-11 DIAGNOSIS — Z471 Aftercare following joint replacement surgery: Secondary | ICD-10-CM | POA: Diagnosis not present

## 2020-03-11 DIAGNOSIS — I4891 Unspecified atrial fibrillation: Secondary | ICD-10-CM | POA: Diagnosis not present

## 2020-03-11 DIAGNOSIS — I071 Rheumatic tricuspid insufficiency: Secondary | ICD-10-CM | POA: Diagnosis not present

## 2020-03-11 DIAGNOSIS — E559 Vitamin D deficiency, unspecified: Secondary | ICD-10-CM | POA: Diagnosis not present

## 2020-03-11 DIAGNOSIS — I5043 Acute on chronic combined systolic (congestive) and diastolic (congestive) heart failure: Secondary | ICD-10-CM | POA: Diagnosis not present

## 2020-03-11 DIAGNOSIS — K580 Irritable bowel syndrome with diarrhea: Secondary | ICD-10-CM | POA: Diagnosis not present

## 2020-03-11 DIAGNOSIS — K219 Gastro-esophageal reflux disease without esophagitis: Secondary | ICD-10-CM | POA: Diagnosis not present

## 2020-03-11 DIAGNOSIS — I252 Old myocardial infarction: Secondary | ICD-10-CM | POA: Diagnosis not present

## 2020-03-11 DIAGNOSIS — D649 Anemia, unspecified: Secondary | ICD-10-CM | POA: Diagnosis not present

## 2020-03-11 DIAGNOSIS — I272 Pulmonary hypertension, unspecified: Secondary | ICD-10-CM | POA: Diagnosis not present

## 2020-03-11 DIAGNOSIS — Z96653 Presence of artificial knee joint, bilateral: Secondary | ICD-10-CM | POA: Diagnosis not present

## 2020-03-11 DIAGNOSIS — M810 Age-related osteoporosis without current pathological fracture: Secondary | ICD-10-CM | POA: Diagnosis not present

## 2020-03-13 DIAGNOSIS — M25661 Stiffness of right knee, not elsewhere classified: Secondary | ICD-10-CM | POA: Diagnosis not present

## 2020-03-13 DIAGNOSIS — M6281 Muscle weakness (generalized): Secondary | ICD-10-CM | POA: Diagnosis not present

## 2020-03-13 DIAGNOSIS — Z96651 Presence of right artificial knee joint: Secondary | ICD-10-CM | POA: Diagnosis not present

## 2020-03-17 DIAGNOSIS — Z96651 Presence of right artificial knee joint: Secondary | ICD-10-CM | POA: Diagnosis not present

## 2020-03-17 DIAGNOSIS — M25661 Stiffness of right knee, not elsewhere classified: Secondary | ICD-10-CM | POA: Diagnosis not present

## 2020-03-17 DIAGNOSIS — M6281 Muscle weakness (generalized): Secondary | ICD-10-CM | POA: Diagnosis not present

## 2020-03-20 DIAGNOSIS — M25661 Stiffness of right knee, not elsewhere classified: Secondary | ICD-10-CM | POA: Diagnosis not present

## 2020-03-20 DIAGNOSIS — Z96651 Presence of right artificial knee joint: Secondary | ICD-10-CM | POA: Diagnosis not present

## 2020-03-20 DIAGNOSIS — M6281 Muscle weakness (generalized): Secondary | ICD-10-CM | POA: Diagnosis not present

## 2020-03-24 DIAGNOSIS — Z96651 Presence of right artificial knee joint: Secondary | ICD-10-CM | POA: Diagnosis not present

## 2020-03-24 DIAGNOSIS — M6281 Muscle weakness (generalized): Secondary | ICD-10-CM | POA: Diagnosis not present

## 2020-03-24 DIAGNOSIS — M25661 Stiffness of right knee, not elsewhere classified: Secondary | ICD-10-CM | POA: Diagnosis not present

## 2020-03-27 DIAGNOSIS — M6281 Muscle weakness (generalized): Secondary | ICD-10-CM | POA: Diagnosis not present

## 2020-03-27 DIAGNOSIS — M25661 Stiffness of right knee, not elsewhere classified: Secondary | ICD-10-CM | POA: Diagnosis not present

## 2020-03-27 DIAGNOSIS — Z96651 Presence of right artificial knee joint: Secondary | ICD-10-CM | POA: Diagnosis not present

## 2020-03-31 DIAGNOSIS — M25661 Stiffness of right knee, not elsewhere classified: Secondary | ICD-10-CM | POA: Diagnosis not present

## 2020-03-31 DIAGNOSIS — M6281 Muscle weakness (generalized): Secondary | ICD-10-CM | POA: Diagnosis not present

## 2020-03-31 DIAGNOSIS — Z96651 Presence of right artificial knee joint: Secondary | ICD-10-CM | POA: Diagnosis not present

## 2020-04-01 ENCOUNTER — Other Ambulatory Visit: Payer: Self-pay | Admitting: Family Medicine

## 2020-04-01 ENCOUNTER — Other Ambulatory Visit: Payer: Self-pay | Admitting: Internal Medicine

## 2020-04-01 NOTE — Telephone Encounter (Signed)
My understanding was that she was taking 1 tablet / 50 mg daily.  I sent the prescription.  Thanks.

## 2020-04-01 NOTE — Telephone Encounter (Signed)
Refill request for Sertraline Last office visit 10/29/19 Medication list shows one daily , refill request shows 3 tablets daily Please confirm the correct directions

## 2020-04-13 ENCOUNTER — Telehealth: Payer: Self-pay | Admitting: Family Medicine

## 2020-04-13 DIAGNOSIS — Z5181 Encounter for therapeutic drug level monitoring: Secondary | ICD-10-CM

## 2020-04-13 NOTE — Telephone Encounter (Signed)
I spoke with pt; pt said for months pt said the sertraline 50 mg taking bid is not helping depression. Pt said she tried taking the sertraline 50 mg tid but she did not like the way it made her feel so pt went back to taking Sertraline 50 mg bid.pt said she cannot get motivated to do anything; pt had recent hip and knee surgeries and pt cannot make herself exercise as she should. NO SI/HI. Pt also said for couple of weeks pt has had S/T and extremely tired but has not had any covid testing. Also pt said for 2 - 3 wks pt has had afib and SOB on and off. At times heart rate 140 - 150 but 04/13/19 BP 120/80 P 103 and has SOB but usually the SOB is between 3 - 4 AM and pt does not have SOB during the day. Pt has not missed any heart meds. No CP. Dr Para March said to send him the note on depression and the Afib and SOB to Dr [name].[facility]. I offered to call Dr Leonides Sake office at [phone] and let pt speak with them now but pt said no just send note because she does not know what to say. I spoke with Nolon Rod at [facility] cardiology and will be sending this note to Dr [name] who is in office and per [facility] I do not need to send to a nurse or CMA. UC & ED precautions given to pt and pt voiced understanding; pt said due to snow she cannot go anywhere today.

## 2020-04-13 NOTE — Telephone Encounter (Signed)
Patient sent message via mychart starting following: "Medication for depression. sertaline is not working for me anymore. and AFIB shortness of breath." Please triage.

## 2020-04-14 ENCOUNTER — Telehealth: Payer: Self-pay

## 2020-04-14 MED ORDER — BUPROPION HCL ER (SR) 100 MG PO TB12: 100.0000 mg | ORAL_TABLET | Freq: Every morning | ORAL | 5 refills | Status: DC

## 2020-04-14 NOTE — Telephone Encounter (Signed)
LVM for pt regarding adding on Wellbutrin per Dr Para March and asked that if she has any questions regarding the VM that I left to contact the office and we can discuss it.  Christy Gentles

## 2020-04-14 NOTE — Telephone Encounter (Signed)
I would try adding on wellbutrin in the AM.  I would continue sertraline 100mg  a day.  I sent the rx for wellbutrin.  If that isn't helping in the next 2 weeks, then let me know.  Thanks.

## 2020-04-15 NOTE — Telephone Encounter (Signed)
[name], please have her increase metoprolol to 100 mg and come in one morning this week to get a digoxin level.  Please hold AM dose until after she gets the lab checked.  Thanks!   TCR

## 2020-04-15 NOTE — Telephone Encounter (Addendum)
Advised patient, verbalized understanding  Will forward to Dr Para March to review potential interaction with Wellbutrin

## 2020-04-16 ENCOUNTER — Other Ambulatory Visit: Payer: Self-pay | Admitting: *Deleted

## 2020-04-16 DIAGNOSIS — Z5181 Encounter for therapeutic drug level monitoring: Secondary | ICD-10-CM

## 2020-04-16 DIAGNOSIS — Z79899 Other long term (current) drug therapy: Secondary | ICD-10-CM | POA: Diagnosis not present

## 2020-04-16 MED ORDER — METOPROLOL SUCCINATE ER 100 MG PO TB24: 100.0000 mg | ORAL_TABLET | Freq: Every day | ORAL | 3 refills | Status: DC

## 2020-04-16 NOTE — Progress Notes (Signed)
DIG LEV

## 2020-04-16 NOTE — Telephone Encounter (Signed)
This should be okay to use (unless Dr. Duke Salvia advises otherwise), with the plan to monitor her heart rate and BP.  I appreciate the help of all involved.

## 2020-04-17 ENCOUNTER — Ambulatory Visit: Payer: Medicare Other | Admitting: Family Medicine

## 2020-04-17 LAB — DIGOXIN LEVEL: Digoxin, Serum: 0.5 ng/mL (ref 0.5–0.9)

## 2020-04-17 NOTE — Telephone Encounter (Signed)
I think that's fair.  Keep tracking BP daily and let me know if her heart rate is going <60 bpm.

## 2020-04-17 NOTE — Telephone Encounter (Signed)
Advised patient, verbalized understanding  

## 2020-04-19 ENCOUNTER — Encounter: Payer: Self-pay | Admitting: Family Medicine

## 2020-04-23 ENCOUNTER — Ambulatory Visit: Payer: Medicare Other | Admitting: Family Medicine

## 2020-04-28 ENCOUNTER — Other Ambulatory Visit: Payer: Self-pay | Admitting: Internal Medicine

## 2020-04-28 MED ORDER — PREDNISONE 5 MG PO TABS: 10.0000 mg | ORAL_TABLET | Freq: Every day | ORAL | 1 refills | Status: DC

## 2020-04-30 ENCOUNTER — Encounter: Payer: Self-pay | Admitting: Family Medicine

## 2020-04-30 ENCOUNTER — Other Ambulatory Visit: Payer: Self-pay

## 2020-04-30 ENCOUNTER — Ambulatory Visit (INDEPENDENT_AMBULATORY_CARE_PROVIDER_SITE_OTHER): Payer: Medicare Other | Admitting: Family Medicine

## 2020-04-30 VITALS — BP 118/84 | HR 97 | Temp 98.3°F | Ht 65.0 in | Wt 153.0 lb

## 2020-04-30 DIAGNOSIS — F32A Depression, unspecified: Secondary | ICD-10-CM | POA: Diagnosis not present

## 2020-04-30 DIAGNOSIS — I4811 Longstanding persistent atrial fibrillation: Secondary | ICD-10-CM

## 2020-04-30 DIAGNOSIS — D649 Anemia, unspecified: Secondary | ICD-10-CM

## 2020-04-30 LAB — CBC WITH DIFFERENTIAL/PLATELET
Basophils Absolute: 0 10*3/uL (ref 0.0–0.1)
Basophils Relative: 0.6 % (ref 0.0–3.0)
Eosinophils Absolute: 0 10*3/uL (ref 0.0–0.7)
Eosinophils Relative: 0.4 % (ref 0.0–5.0)
HCT: 39.7 % (ref 36.0–46.0)
Hemoglobin: 12.9 g/dL (ref 12.0–15.0)
Lymphocytes Relative: 12.4 % (ref 12.0–46.0)
Lymphs Abs: 0.9 10*3/uL (ref 0.7–4.0)
MCHC: 32.6 g/dL (ref 30.0–36.0)
MCV: 91 fl (ref 78.0–100.0)
Monocytes Absolute: 0.4 10*3/uL (ref 0.1–1.0)
Monocytes Relative: 6 % (ref 3.0–12.0)
Neutro Abs: 5.9 10*3/uL (ref 1.4–7.7)
Neutrophils Relative %: 80.6 % — ABNORMAL HIGH (ref 43.0–77.0)
Platelets: 261 10*3/uL (ref 150.0–400.0)
RBC: 4.36 Mil/uL (ref 3.87–5.11)
RDW: 13.7 % (ref 11.5–15.5)
WBC: 7.3 10*3/uL (ref 4.0–10.5)

## 2020-04-30 LAB — COMPREHENSIVE METABOLIC PANEL
ALT: 11 U/L (ref 0–35)
AST: 16 U/L (ref 0–37)
Albumin: 3.9 g/dL (ref 3.5–5.2)
Alkaline Phosphatase: 84 U/L (ref 39–117)
BUN: 16 mg/dL (ref 6–23)
CO2: 29 mEq/L (ref 19–32)
Calcium: 9.4 mg/dL (ref 8.4–10.5)
Chloride: 97 mEq/L (ref 96–112)
Creatinine, Ser: 0.8 mg/dL (ref 0.40–1.20)
GFR: 66.35 mL/min (ref >60.00)
Glucose, Bld: 102 mg/dL — ABNORMAL HIGH (ref 70–99)
Potassium: 4.2 mEq/L (ref 3.5–5.1)
Sodium: 132 mEq/L — ABNORMAL LOW (ref 135–145)
Total Bilirubin: 0.8 mg/dL (ref 0.2–1.2)
Total Protein: 6.5 g/dL (ref 6.0–8.3)

## 2020-04-30 MED ORDER — SERTRALINE HCL 50 MG PO TABS
100.0000 mg | ORAL_TABLET | Freq: Every day | ORAL | Status: DC
Start: 2020-04-30 — End: 2020-10-29

## 2020-04-30 NOTE — Progress Notes (Signed)
This visit occurred during the SARS-CoV-2 public health emergency.  Safety protocols were in place, including screening questions prior to the visit, additional usage of staff PPE, and extensive cleaning of exam room while observing appropriate contact time as indicated for disinfecting solutions.  Follow up re: mood.    Recently with wellbutrin added to sertraline.  She thought that helped overall, in just a few days.  "My mind is more at ease and I am not so down in the dumps."  She is still fatigued even though she is sleeping most of the night.    She feels diffusely weak.  She can occ get lightheaded.  She is using a walker after knee surgery.  L thigh soreness is some better but not resolved.  She is doing her PT, using a home pedal device for ROM.  She feels weaker later in the PM.    She thought she was getting weaker as the day goes on, ie physical exhaustion that is stressful for her.  Some days are better than others.  She has checked her blood pressure in the mornings and it is usually controlled, usually 120s to 140s over 70s to 80s with a pulse usually in the 70s to 90s.  We do not know about her blood pressure and pulse later in the day.  Discussed.  Meds, vitals, and allergies reviewed.   ROS: Per HPI unless specifically indicated in ROS section   nad ncat Neck supple, no LA IRR not tachy ctab abd soft, normal BS R knee looks well healed at incision site.   Skin well perfused.  No edema in the BLE.   31 minutes were devoted to patient care in this encounter (this includes time spent reviewing the patient's file/history, interviewing and examining the patient, counseling/reviewing plan with patient).

## 2020-04-30 NOTE — Patient Instructions (Addendum)
I would recheck your BP and pulse in the afternoon and let me know about that.  I would do that before changing your meds.   Be careful on standing.  Keep using your walker.   We'll check your labs today.  Take care.  Glad to see you.

## 2020-05-01 ENCOUNTER — Other Ambulatory Visit: Payer: Self-pay | Admitting: Internal Medicine

## 2020-05-03 NOTE — Assessment & Plan Note (Signed)
We do not know if her decrease in energy later in the day is related to blood pressure/pulse or if it is related to depression or physical deconditioning.  She is going to check her blood pressure and pulse in the afternoons and let me know about that.  Reasonable to recheck routine labs in the meantime.  See orders and results.  She agrees with plan.  Routine cautions given to patient.  See after visit summary.

## 2020-05-03 NOTE — Assessment & Plan Note (Signed)
Improved with Wellbutrin.  Would continue as is for now with sertraline and Wellbutrin.  She agrees with plan.

## 2020-05-05 ENCOUNTER — Encounter: Payer: Self-pay | Admitting: Family Medicine

## 2020-05-06 ENCOUNTER — Encounter: Payer: Self-pay | Admitting: Family Medicine

## 2020-05-07 ENCOUNTER — Other Ambulatory Visit: Payer: Self-pay | Admitting: Family Medicine

## 2020-05-07 ENCOUNTER — Encounter: Payer: Self-pay | Admitting: Family Medicine

## 2020-05-08 NOTE — Telephone Encounter (Signed)
Noted.  I will defer to patient.

## 2020-05-08 NOTE — Telephone Encounter (Signed)
Okay to change to 90 days

## 2020-05-10 IMAGING — DX DG HIP (WITH OR WITHOUT PELVIS) 1V PORT*L*
2 series · 3 of 3 positions shown · non-contrast
Comparison: Pre operative radiograph 08/03/2019

CLINICAL DATA: Left hip pain. Left hip replacement 1 day.

EXAM:
DG HIP (WITH OR WITHOUT PELVIS) 1V PORT LEFT

[pelvis]
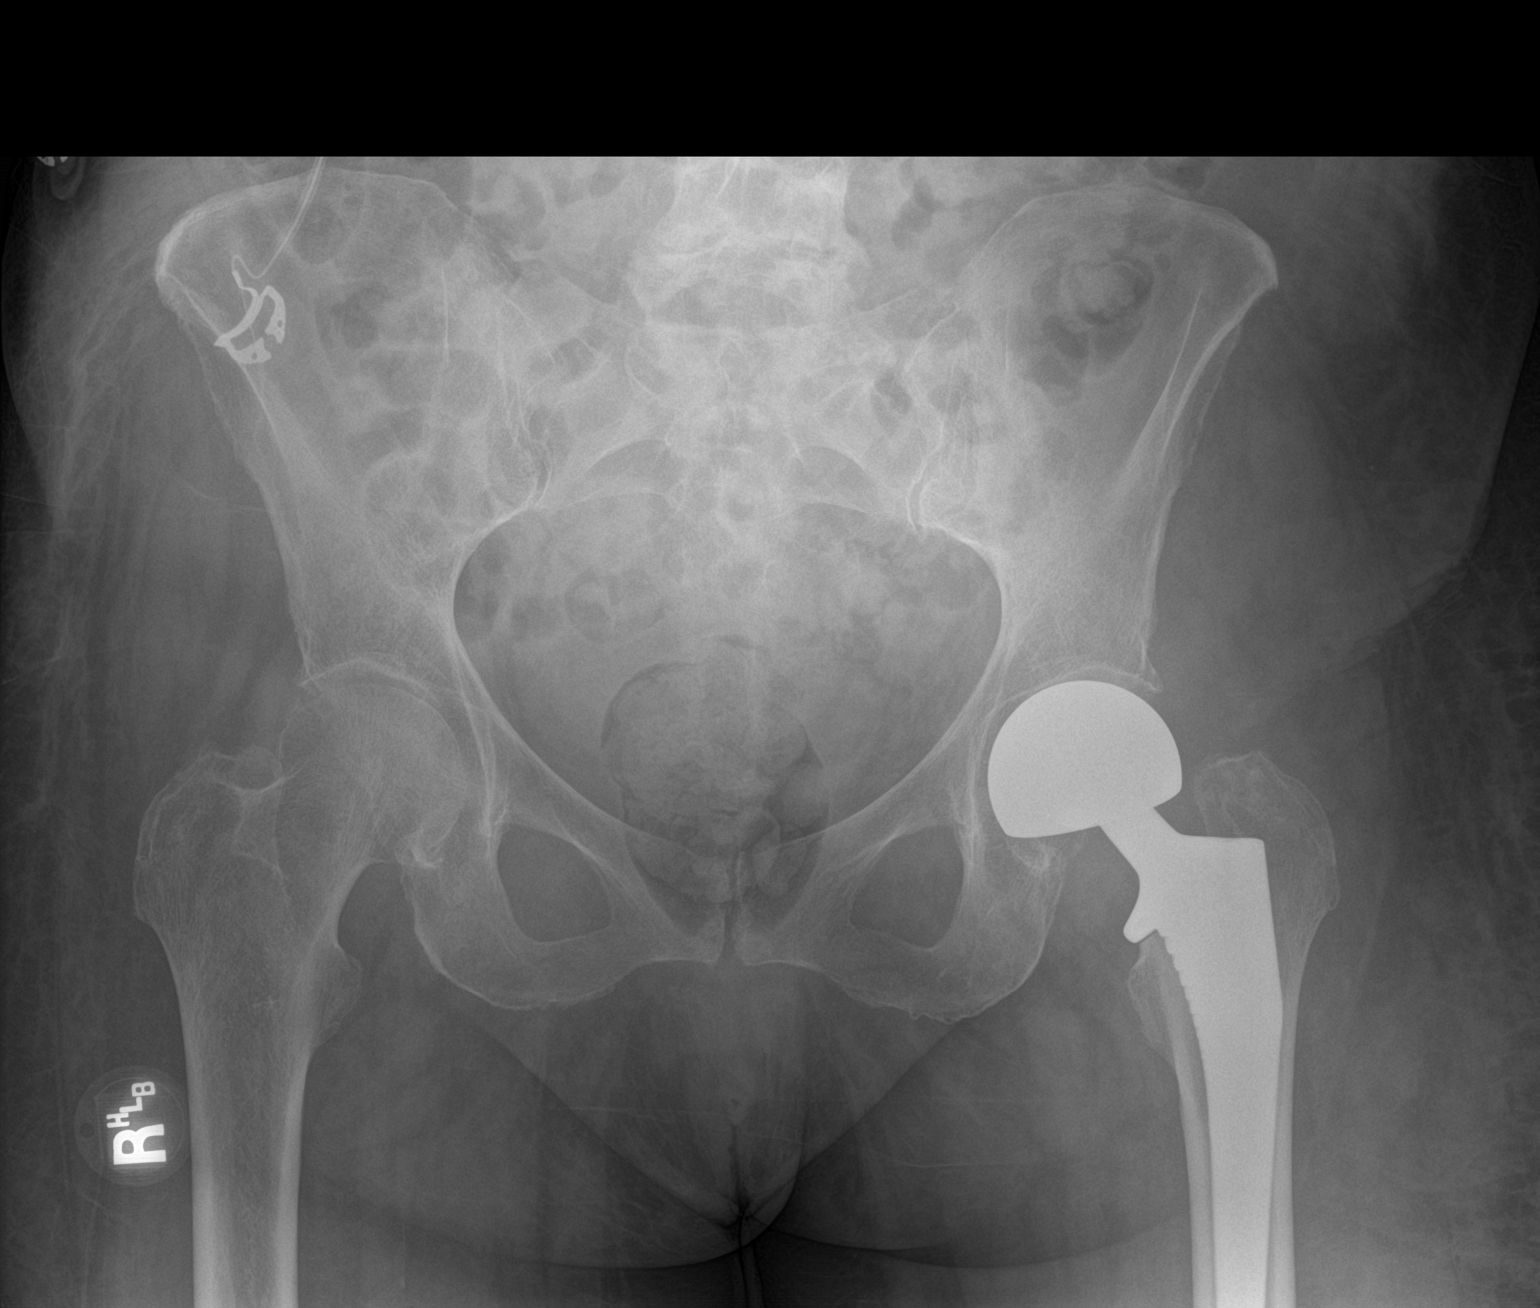

[Series 2: hip · 0.14mm/px · 2 of 2 slices shown]
[im 1/2]
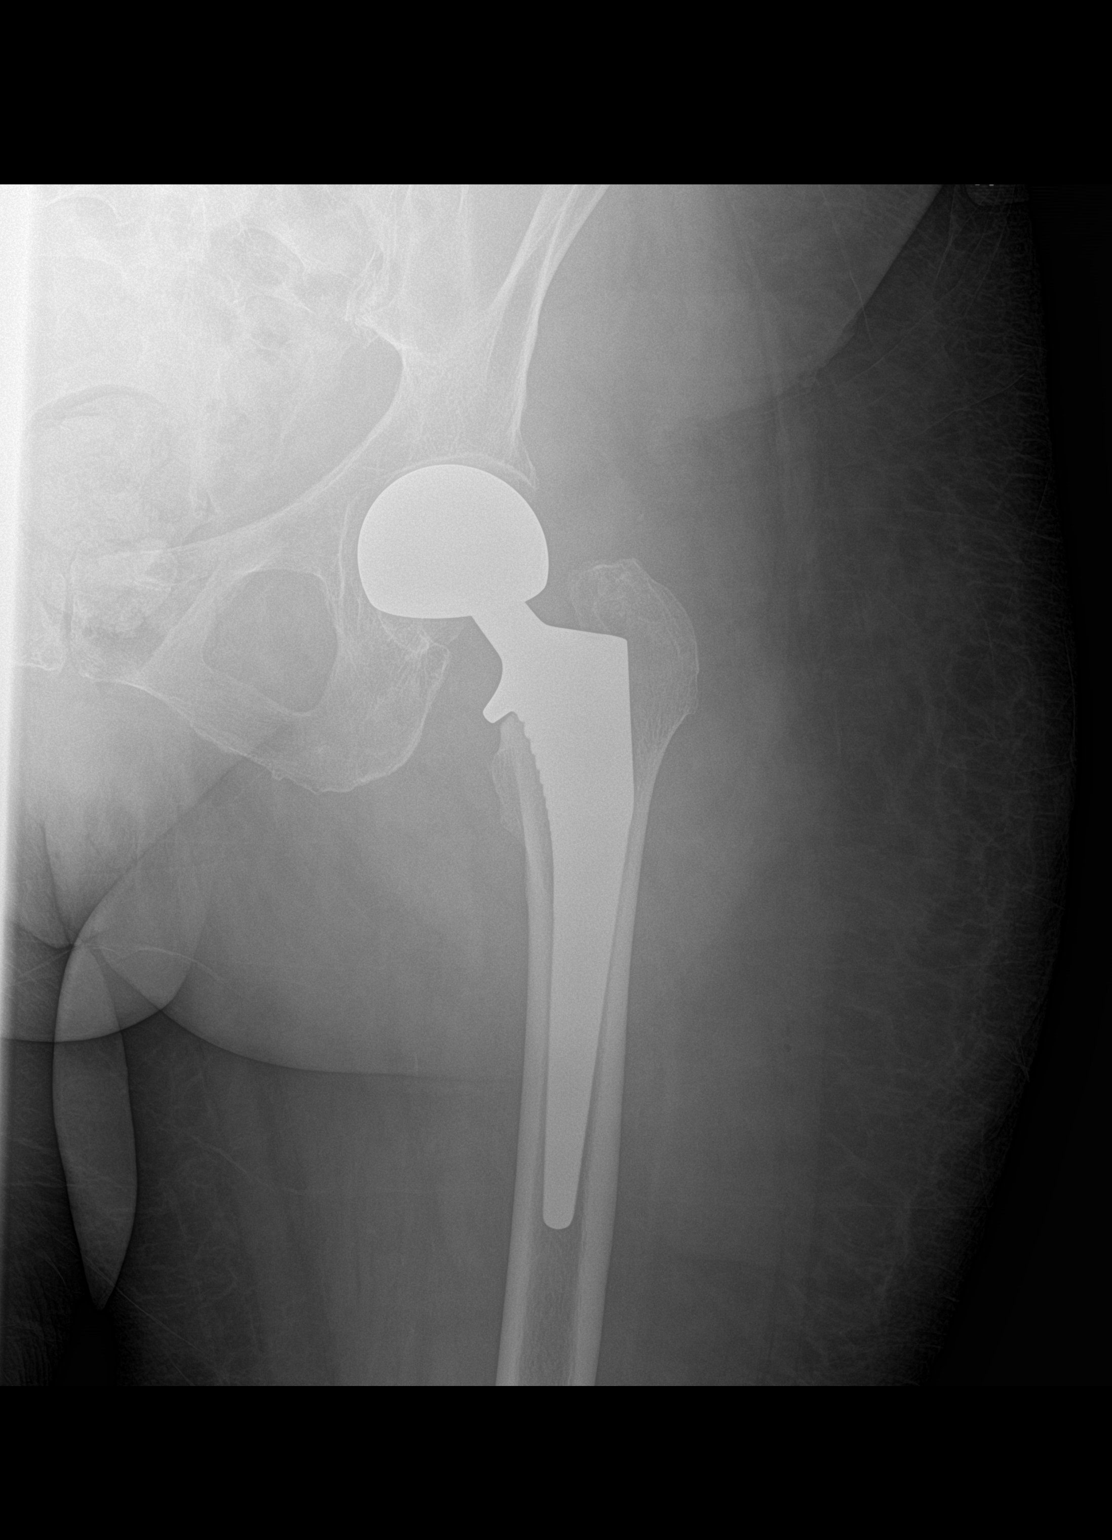
[im 2/2]
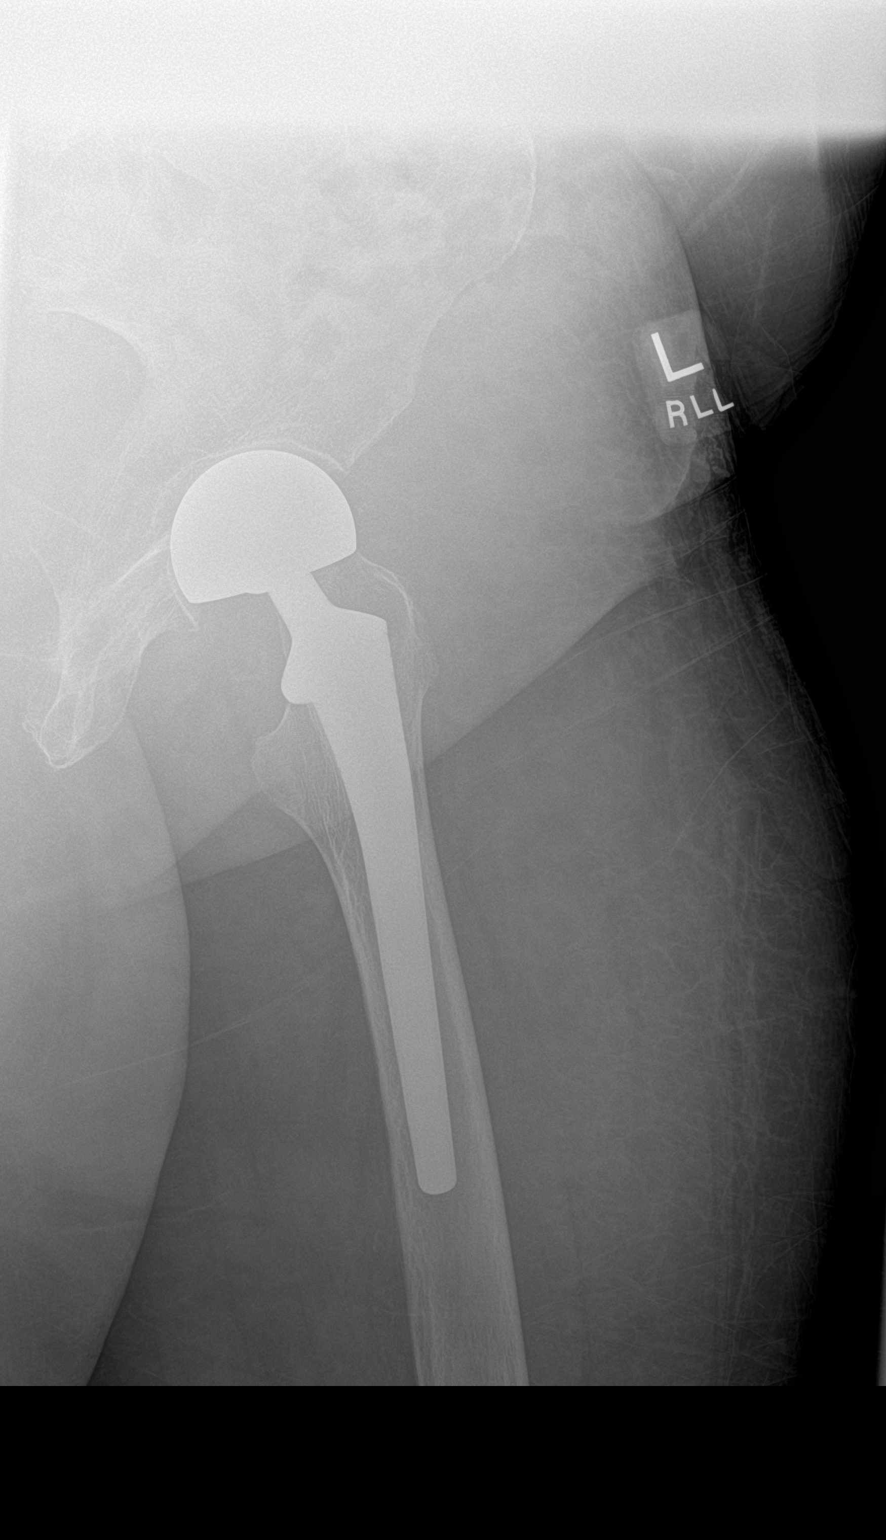

[3 of 3 positions shown; findings below may reference images not displayed]

FINDINGS: Left hip arthroplasty in expected alignment. No periprosthetic
lucency or fracture. Pubic rami are intact. Minor right hip
osteoarthritis. Bones are diffusely under mineralized. Expected soft
tissue edema laterally. No suspicious soft tissue air.
IMPRESSION: Left hip arthroplasty in expected alignment. No acute findings or
evidence postoperative complication.

## 2020-05-10 NOTE — Telephone Encounter (Signed)
Yes, sent. Thanks.  ?

## 2020-05-12 ENCOUNTER — Encounter: Payer: Self-pay | Admitting: Family Medicine

## 2020-05-15 ENCOUNTER — Other Ambulatory Visit: Payer: Self-pay | Admitting: Cardiovascular Disease

## 2020-05-16 ENCOUNTER — Encounter: Payer: Self-pay | Admitting: Family Medicine

## 2020-05-18 ENCOUNTER — Other Ambulatory Visit: Payer: Self-pay | Admitting: Family Medicine

## 2020-05-18 DIAGNOSIS — Z9889 Other specified postprocedural states: Secondary | ICD-10-CM | POA: Diagnosis not present

## 2020-05-20 ENCOUNTER — Encounter: Payer: Self-pay | Admitting: Cardiovascular Disease

## 2020-05-20 ENCOUNTER — Other Ambulatory Visit: Payer: Self-pay

## 2020-05-20 ENCOUNTER — Ambulatory Visit: Payer: Medicare Other | Admitting: Cardiovascular Disease

## 2020-05-20 ENCOUNTER — Other Ambulatory Visit: Payer: Self-pay | Admitting: Orthopaedic Surgery

## 2020-05-20 VITALS — BP 134/82 | HR 77 | Ht 65.0 in | Wt 148.0 lb

## 2020-05-20 DIAGNOSIS — I4811 Longstanding persistent atrial fibrillation: Secondary | ICD-10-CM

## 2020-05-20 DIAGNOSIS — Z5181 Encounter for therapeutic drug level monitoring: Secondary | ICD-10-CM | POA: Diagnosis not present

## 2020-05-20 DIAGNOSIS — I272 Pulmonary hypertension, unspecified: Secondary | ICD-10-CM

## 2020-05-20 DIAGNOSIS — I4891 Unspecified atrial fibrillation: Secondary | ICD-10-CM | POA: Diagnosis not present

## 2020-05-20 DIAGNOSIS — I071 Rheumatic tricuspid insufficiency: Secondary | ICD-10-CM

## 2020-05-20 DIAGNOSIS — I5042 Chronic combined systolic (congestive) and diastolic (congestive) heart failure: Secondary | ICD-10-CM | POA: Diagnosis not present

## 2020-05-20 DIAGNOSIS — Z01818 Encounter for other preprocedural examination: Secondary | ICD-10-CM

## 2020-05-20 MED ORDER — LOSARTAN POTASSIUM 25 MG PO TABS: 25.0000 mg | ORAL_TABLET | Freq: Every day | ORAL | 3 refills | Status: DC

## 2020-05-20 NOTE — Patient Instructions (Signed)
Medication Instructions:  START LOSARTAN 25 MG DAILY   *If you need a refill on your cardiac medications before your next appointment, please call your pharmacy*  Lab Work: BMET IN 1 WEEK   If you have labs (blood work) drawn today and your tests are completely normal, you will receive your results only by: [name] MyChart Message (if you have MyChart) OR . A paper copy in the mail If you have any lab test that is abnormal or we need to change your treatment, we will call you to review the results.  Testing/Procedures: NONE  Follow-Up: At [facility], you and your health needs are our priority.  As part of our continuing mission to provide you with exceptional heart care, we have created designated Provider Care Teams.  These Care Teams include your primary Cardiologist (physician) and Advanced Practice Providers (APPs -  Physician Assistants and Nurse Practitioners) who all work together to provide you with the care you need, when you need it.  We recommend signing up for the patient portal called "MyChart".  Sign up information is provided on this After Visit Summary.  MyChart is used to connect with patients for Virtual Visits (Telemedicine).  Patients are able to view lab/test results, encounter notes, upcoming appointments, etc.  Non-urgent messages can be sent to your provider as well.   To learn more about what you can do with MyChart, go to [email or web address].    Your next appointment:   3 month(s)  The format for your next appointment:   In Person  Provider:   You may see [name], MD or one of the following Advanced Practice Providers on your designated Care Team:    Corine Shelter, PA-C  [place], [place]  Edd Fabian, [place]

## 2020-05-20 NOTE — Progress Notes (Signed)
Cardiology Office Note   Date:  05/20/2020   ID:  [name], DOB [birth year], MRN [id number]  PCP:  [name], MD  Cardiologist:   [name], MD  Cardiology APP: [name], [name]  No chief complaint on file.    History of Present Illness: [name] is a 87 y.o. female with chronic atrial fibrillation, chronic systolic and diastolic heart failure, left bundle branch block, spontaneous retroperitoneal bleed on Coumadin, and colitis who presents for follow-up. She is previously seen by [name] in clinic 03/2017 and cleared for surgery. At that time she was in atrial fibrillation with well-controlled rates. She underwent left knee replacement 04/2017. Her hospital course was complicated by atrial fibrillation with rapid ventricular response and acute systolic and diastolic heart failure. Echo that admission revealed LVEF 15% which was down from 50% in 2017. She was diuresed with IV Lasix and had a Lexiscan Myoview without evidence of ischemia. She failed amiodarone and was started on digoxin. She had a repeat echocardiogram 07/2017 that revealed LVEF 20 to 25%.  I just later did take orthostatics losartan was discontinued due to low BP and fatigue. Her fatigue has improved. She also struggled with diarrhea and stopped taking her digoxin in hopes that this would help. However the diarrhea did not improve. Digoxin was resumed.  She has declined ICD.  [name] saw her PCP 06/05/2018 and was in atrial fibrillation at that time.   Since her last appointment she continues to struggle with fatigue and dizziness.  She notes dizziness upon standing.  She feels tired constantly.  She has no dizziness when lying down.  She wonders if it could be related to dehydration.  Torsemide was discontinued because she thought it was causing diarrhea.  Initially they cut it in half and then she stopped it.  There was not really much improvement in her symptoms.  In fact, it  seems as though she may be getting more dizzy.  She has no lower extremity edema, orthopnea, or PND.  She has been taking prednisone for her colitis which seems to have helped both her stomach and her knees.  At her last appointment metoprolol was held due to dizziness and fatigue.  She was started on amiodarone for rate control.  She saw Dr. Para March on 7/20 and was still feeling fatigued and sleepy.  Her heart rate was back up to the 120s.  She was started back on metoprolol.  She was referred to the advanced heart failure clinic but did not see them because she was first admitted to the hospital 07/2019 with a fall and a femoral neck fracture.  She underwent surgery without complication.  She did require diuresis with IV Lasix.  Echo at that time revealed LVEF 35% with global hypokinesis and moderate LVH.  She also had moderately reduced right-sided function with severe pulmonary hypertension.  PASP was 83 mmHg.  She has been struggling with the L knee popping out.  She previously had that knee replaced and needs to have it fixed.  They can fix it with just spinal anesthesia.  She thinks she will need R knee replacement as well.  Lately her breathing has been OK.  She has occasional epigstric discomfort but always at rest.  She has no orthopnea or PND.  Her BP has been well-controlled at home.  Her heart rate has been mostly in the 80-90s.  She has no edema, orthopnea or PND.  She has been in  atrial fibrillation since 2010. She complains of poor balance and dizziness standing.  It improved after increasing metoprolol.  At her last appointment she was referred to ADV HF clinic for pulmonary hypertension.  She hasn't been seen yet.  She started on depression medication and her mood has improved. She continues to feel very tired but thinks this may be improving as well.  She has been struggling with pain in her knees.  Her R knee cap is out of place and she needs surgery.  She is feeling weak.  She is still able to  exercise using a pedal or bike.  She has no exertional symptoms.  Her breathing has been stable. She has no LE edema, orthopnea or PND.  She also struggles with pain in her L hip and soreness.  She feels like her knees are going to give out.  She has fallen due to her leg weakness.  Her blood pressure at home has been running in the 130s to 140s for the most part.  She notes that since starting the Wellbutrin her blood pressure has been a little higher.   Past Medical History:  Diagnosis Date  . Acute combined systolic and diastolic heart failure (HCC) 05/02/2017  . Anemia   . Anxiety   . Arthritis    "knees" (06/18/2015)  . Atrial fibrillation with RVR (HCC) 06/18/2015   h/o sig bleed on coumadin  . Cardiogenic shock (HCC) 05/02/2017  . CHF (congestive heart failure) (HCC)   . Complication of anesthesia 2010   "w/cardiac cath; got into a psychotic state for ~ 3 days; got me out w/Valium"  . Depression    "I have it off and on; not as often as I've gotten older" (06/18/2015)  . Diverticulosis    descending and sigmoid colon--Dr. Jarold Motto  . DJD (degenerative joint disease) of knee    left  . Dyspnea   . Dysrhythmia   . E coli infection    History of  . GERD (gastroesophageal reflux disease)   . History of Clostridium difficile colitis   . IBS (irritable bowel syndrome)   . Insomnia     off chronic ambien 5mg  as of 10/11, rare/episodic use since then  DCM/CHF  . LBBB (left bundle branch block)   . Lymphocytic colitis 09/27/2016  . Moderate to severe pulmonary hypertension (HCC) 11/26/2019  . Nonischemic cardiomyopathy (HCC)    normal coronaries, EF 15-20% 04/2008, EF 50-55% 2015  . Osteoporosis    on DXA 05/2010  . Positive PPD   . Retroperitoneal bleed   . Sciatica    Right    Past Surgical History:  Procedure Laterality Date  . ANTERIOR APPROACH HEMI HIP ARTHROPLASTY Left 08/05/2019   Procedure: ANTERIOR APPROACH HEMI HIP ARTHROPLASTY;  Surgeon: 08/07/2019, MD;  Location: [facility]  OR;  Service: Orthopedics;  Laterality: Left;  . BIOPSY  11/22/2017   Procedure: BIOPSY;  Surgeon: 11/24/2017, MD;  Location: [facility] ENDOSCOPY;  Service: Endoscopy;;  . CARDIAC CATHETERIZATION  2010  . CARDIOVERSION N/A 06/19/2015   Procedure: CARDIOVERSION;  Surgeon: 08/19/2015, MD;  Location: [facility] ENDOSCOPY;  Service: Cardiovascular;  Laterality: N/A;  . CATARACT EXTRACTION W/ INTRAOCULAR LENS  IMPLANT, BILATERAL Bilateral   . COLONOSCOPY WITH PROPOFOL N/A 11/22/2017   Procedure: COLONOSCOPY WITH PROPOFOL;  Surgeon: 11/24/2017, MD;  Location: [facility] ENDOSCOPY;  Service: Endoscopy;  Laterality: N/A;  . DILATION AND CURETTAGE OF UTERUS    . TEE WITHOUT CARDIOVERSION N/A 06/19/2015  Procedure: TRANSESOPHAGEAL ECHOCARDIOGRAM (TEE);  Surgeon: Laurey Morale, MD;  Location: [facility] ENDOSCOPY;  Service: Cardiovascular;  Laterality: N/A;  . TOTAL KNEE ARTHROPLASTY Left 04/25/2017   Procedure: TOTAL KNEE ARTHROPLASTY;  Surgeon: [name], MD;  Location: [facility] OR;  Service: Orthopedics;  Laterality: Left;  . TOTAL KNEE ARTHROPLASTY Right 02/18/2020   Procedure: RIGHT TOTAL KNEE ARTHROPLASTY;  Surgeon: [name], MD;  Location: [facility] ORS;  Service: Orthopedics;  Laterality: Right;  . TOTAL KNEE REVISION Left 12/03/2019   Procedure: LEFT TOTAL KNEE REVISION  OF POYLY EXCHANGE;  Surgeon: [name], MD;  Location: [facility] ORS;  Service: Orthopedics;  Laterality: Left;  [name] VAGINAL HYSTERECTOMY  1979   ovaries intact     Current Outpatient Medications  Medication Sig Dispense Refill  . acetaminophen (TYLENOL) 500 MG tablet Take 1,000 mg by mouth 3 (three) times daily as needed for moderate pain or headache.     [name] BIOTIN PO Take 1 tablet by mouth daily.    [name] buPROPion (WELLBUTRIN SR) 100 MG 12 hr tablet TAKE 1 TABLET (100 MG TOTAL) BY MOUTH IN THE MORNING. 90 tablet 2  . calcium carbonate (TUMS - DOSED IN MG ELEMENTAL CALCIUM) 500 MG chewable tablet Chew 1 tablet by mouth 2 (two) times daily as needed  for indigestion or heartburn.     . Carboxymethylcellul-Glycerin ([employer] LUBRICATING/DRY EYE OP) Place 1-2 drops into both eyes daily as needed (for dry eyes).    . cholecalciferol (VITAMIN D3) 25 MCG (1000 UNIT) tablet Take 1,000 Units by mouth daily.    . digoxin (LANOXIN) 0.125 MG tablet TAKE 1 TABLET BY MOUTH DAILY 90 tablet 3  . ELIQUIS 2.5 MG TABS tablet TAKE 1 TABLET BY MOUTH TWICE A DAY (Patient taking differently: Take 2.5 mg by mouth 2 (two) times daily.) 180 tablet 1  . KLOR-CON M20 20 MEQ tablet TAKE 1 TABLET BY MOUTH TWICE A DAY 180 tablet 2  . loperamide (IMODIUM) 2 MG capsule Take 2 mg by mouth 3 (three) times daily as needed for diarrhea or loose stools.     [name] losartan (COZAAR) 25 MG tablet Take 1 tablet (25 mg total) by mouth daily. 90 tablet 3  . metoprolol succinate (TOPROL-XL) 100 MG 24 hr tablet Take 1 tablet (100 mg total) by mouth daily. Take with or immediately following a meal. 90 tablet 3  . mirabegron ER (MYRBETRIQ) 25 MG TB24 tablet Take 1 tablet (25 mg total) by mouth daily. 30 tablet 1  . phenylephrine (NEO-SYNEPHRINE) 1 % nasal spray Place 1 drop into both nostrils every 6 (six) hours as needed for congestion.    Bertram Gala Glycol-Propyl Glycol (SYSTANE OP) Place 1 drop into both eyes daily as needed (dry eyes).    . predniSONE (DELTASONE) 5 MG tablet Take 2 tablets (10 mg total) by mouth daily with breakfast. 60 tablet 1  . sertraline (ZOLOFT) 50 MG tablet Take 2 tablets (100 mg total) by mouth daily.    . tizanidine (ZANAFLEX) 2 MG capsule Take 1 capsule (2 mg total) by mouth every 6 (six) hours as needed for muscle spasms. 30 capsule 1  . traMADol (ULTRAM) 50 MG tablet Take 1-2 tablets (50-100 mg total) by mouth every 6 (six) hours as needed for moderate pain or severe pain (post op pain). 30 tablet 0  . trolamine salicylate (ASPERCREME) 10 % cream Apply 1 application topically at bedtime.     No current facility-administered medications for this visit.     Allergies:  Ace inhibitors, Warfarin sodium, Famotidine, Codeine, Delsym [dextromethorphan polistirex er], Lactose intolerance (gi), Phenylephrine, and Sulfamethoxazole-trimethoprim    Social History:  The patient  reports that she has never smoked. She has never used smokeless tobacco. She reports that she does not drink alcohol and does not use drugs.   Family History:  The patient's family history includes Colon cancer in her father; Other in her father; Tuberculosis in her mother.    ROS:  Please see the history of present illness.   Otherwise, review of systems are positive for none.   All other systems are reviewed and negative.    PHYSICAL EXAM: VS:  BP 134/82   Pulse 77   Ht 5\' 5"  (1.651 m)   Wt 148 lb (67.1 kg)   SpO2 95%   BMI 24.63 kg/m  , BMI Body mass index is 24.63 kg/m. GENERAL:  Well appearing HEENT: Pupils equal round and reactive, fundi not visualized, oral mucosa unremarkable NECK:  No jugular venous distention, waveform within normal limits, carotid upstroke brisk and symmetric, no bruits LUNGS:  Clear to auscultation bilaterally HEART:  Irregularly irregular.  PMI not displaced or sustained,S1 and S2 within normal limits, no S3, no S4, no clicks, no rubs, II/VI systolic murmur at the apex ABD:  Flat, positive bowel sounds normal in frequency in pitch, no bruits, no rebound, no guarding, no midline pulsatile mass, no hepatomegaly, no splenomegaly EXT:  2 plus pulses throughout, no edema, no cyanosis no clubbing SKIN:  No rashes no nodules NEURO:  Cranial nerves II through XII grossly intact, motor grossly intact throughout PSYCH:  Cognitively intact, oriented to person place and time   EKG:  EKG is ordered today. 02/01/2018: Atrial fibrillation.  Rate 70 bpm.  Right axis deviation.  IVCD. 02/05/2019: Atrial fibrillation.  Rate 88 bpm.  Left axis deviation.  Left bundle branch block. 07/17/19: Atrial fibrillation.  Rate 86 bpm.  LBBB. 11/26/19: Atrial  fibrillation. Rate 90 bpm.  LBBB.  LAD.  05/20/20: Atrial fibrillation.  Rate 77 bpm.  Left axis deviation.  Left bundle branch block.  Echo 04/30/17: Study Conclusions  - Left ventricle: The cavity size was moderately dilated. Wall   thickness was normal. Systolic function was severely reduced. The   estimated ejection fraction was 15%. No mural thrombus with   Definity contrast. Diffuse hypokinesis. The study is not   technically sufficient to allow evaluation of LV diastolic   function. - Aortic valve: Mildly calcified leaflets. There was no stenosis.   There was no regurgitation. - Mitral valve: Mildly thickened leaflets . There was moderate   regurgitation. - Left atrium: Severely dilated. - Right ventricle: The cavity size was mildly dilated. Mildly   reduced systolic function. - Right atrium: Severely dilated. - Tricuspid valve: There was moderate to severe regurgitation. - Pulmonary arteries: PA peak pressure: 45 mm Hg (S). - Inferior vena cava: The vessel was dilated. The respirophasic   diameter changes were blunted (< 50%), consistent with elevated   central venous pressure. - Pericardium, extracardiac: There was a left pleural effusion.  Echo 08/03/17: Study Conclusions  - Left ventricle: The cavity size was normal. Wall thickness was   increased in a pattern of mild LVH. Systolic function was   severely reduced. The estimated ejection fraction was in the   range of 20% to 25%. Diffuse hypokinesis. There is dyskinesis of   the anteroseptal myocardium. The study is not technically   sufficient to allow evaluation of LV diastolic function. -  Aortic root: The aortic root was normal in size. - Mitral valve: Calcified annulus. - Left atrium: The atrium was moderately dilated. - Pulmonary arteries: Systolic pressure was mildly to moderately   increased.  Impressions:  - Diffuse hypokinesis with septal dyskinesis; overall severely   reduced LV systolic function; mild  LVH; moderate LAE; mild TR;   mild to moderate pulmonary hypertension.   Recent Labs: 10/29/2019: TSH 2.53 04/30/2020: ALT 11; BUN 16; Creatinine, Ser 0.80; Hemoglobin 12.9; Platelets 261.0; Potassium 4.2; Sodium 132    Lipid Panel    Component Value Date/Time   CHOL 261 (H) 12/27/2018 1657   CHOL 156 02/05/2018 0937   TRIG (H) 12/27/2018 1657    424.0 Triglyceride is over 400; calculations on Lipids are invalid.   HDL 32.20 (L) 12/27/2018 1657   HDL 58 02/05/2018 0937   CHOLHDL 8 12/27/2018 1657   VLDL 30.2 01/30/2017 1451   LDLCALC 73 02/05/2018 0937   LDLDIRECT 58.0 12/27/2018 1657      Wt Readings from Last 3 Encounters:  05/20/20 148 lb (67.1 kg)  04/30/20 153 lb (69.4 kg)  02/18/20 155 lb 6.8 oz (70.5 kg)      ASSESSMENT AND PLAN:  # Chronic systolic and diastolic heart failure: LVEF 15% improved to 35%.  Her fatigue did not improve after stopping her beta-blocker.  It seems to be improving with treatment of her depression.  She is euvolemic.  She does have severe pulmonary hypertension noted on her most recent echo.  Her right atrial pressure was 15 mmHg at that time.  I suspect that her right heart failure was at least partially attributable to left heart failure.  It is unlikely she has CTEPH in the setting of chronic anticoagulation.  She has no symptoms of autoimmune disease.  Given that she is improving and her age, we will defer on referring her back to advanced hypertension clinic.  Continue with management of her left-sided heart failure.  Given that her blood pressures running higher now we have room to add losartan 25 mg daily.  Check a basic metabolic panel in a week.  Continue metoprolol.  I  # Hypertension: BP above goal.  Add losartan as above.  Continue metoprolol.  # Chronic atrial fibrillation: Continue Eliquis, metoprolol, and digoxin.  Continue rate control as above. Digoxin level OK 04/2020  # Depression: Improving on Zoloft and Wellbutrin.  #  Pre-operative risk:  [name] needs surgery on her L knee.  Her main risks are severe pulmonary hypertension, chronic systolic and diastolic heart failure, atrial fibrillation and age.  She tolerated hip surgery and knee surgery recently.  She uses her exercise bike without ischemic symptoms.  No indication for any additional testing.  Would avoid general anesthesia if at all possible due to her age and heart failure.  Current medicines are reviewed at length with the patient today.  The patient has concerns regarding medicines.  The following changes have been made: Add losartan   Labs/ tests ordered today include:   Orders Placed This Encounter  Procedures  . Basic metabolic panel  . EKG 12-Lead     Disposition:   FU with Shafiq Larch C. Duke Salviaandolph, MD, [facility] in [facility] months    Signed, Cedra Villalon C. Duke Salviaandolph, MD, [facility]  05/20/2020 3:41 PM    [facility]

## 2020-05-21 ENCOUNTER — Telehealth: Payer: Self-pay | Admitting: Cardiovascular Disease

## 2020-05-21 NOTE — Telephone Encounter (Signed)
   Primary Cardiologist: [name], MD  Chart reviewed as part of pre-operative protocol coverage. [name] was last seen yesterday by Dr. Duke Salvia with history of severe pulmonary HTN, chronic atrial fibrillation, chronic systolic and diastolic heart failure, left bundle branch block, depression, spontaneous retroperitoneal bleed on Coumadin, and colitis. Per Dr. Leonides Sake note, at last appt had been referred to AHF clinic for pulmonary HTN but had not been seen yet. Dr. Duke Salvia made commentary regarding preoperative clearance in that note. (Inadvertantly says left knee but surgery clarified on the right.) Will route to pharmD for input on anticoag then plan to bundle rec.  Laurann Montana, PA-C 05/21/2020, 12:16 PM

## 2020-05-21 NOTE — Telephone Encounter (Signed)
   [facility] Pre-operative Risk Assessment    HEARTCARE STAFF: - Please ensure there is not already an duplicate clearance open for this procedure. - Under Visit Info/Reason for Call, type in Other and utilize the format Clearance MM/DD/YY or Clearance TBD. Do not use dashes or single digits. - If request is for dental extraction, please clarify the # of teeth to be extracted.  Request for surgical clearance:  1. What type of surgery is being performed? Right knee replacement retinacular repair   2. When is this surgery scheduled? 06/16/2020   3. What type of clearance is required (medical clearance vs. Pharmacy clearance to hold med vs. Both)? Both  4. Are there any medications that need to be held prior to surgery and how long? eliquis 3-5 days  5. Practice name and name of physician performing surgery? [facility], Dr. Melrose Nakayama  6. What is the office phone number? [phone]   7.   What is the office fax number? [phone]  8.   Anesthesia type (None, local, MAC, general) ? Spinal    [name] 05/21/2020, 8:59 AM  _________________________________________________________________   (provider comments below)

## 2020-05-21 NOTE — Telephone Encounter (Signed)
Patient with diagnosis of afib on Eliquis 2.5mg  BID for anticoagulation. Taking lower dose due to previous bleed per Dr Duke Salvia.  Procedure: Right knee replacement retinacular repair   Date of procedure: 06/16/20  CHA2DS2-VASc Score = 6  This indicates a 9.7% annual risk of stroke. The patient's score is based upon: CHF History: Yes HTN History: Yes Diabetes History: No Stroke History: No Vascular Disease History: Yes Age Score: 2 Gender Score: 1  CrCl 46mL/min Platelet count 261K  Per office protocol, patient can hold Eliquis for 3 days prior to procedure.

## 2020-05-21 NOTE — Telephone Encounter (Signed)
   Primary Cardiologist: [name], MD  Chart revisited as part of pre-operative protocol coverage. To summarize, Dr. Duke Salvia assessed patient yesterday for pre-operative risk. The note indicates surgery on L knee but clearance clarifies it is the right. This clearance can still be utilized for this procedure. Per Dr. Leonides Sake note: "Her main risks are severe pulmonary hypertension, chronic systolic and diastolic heart failure, atrial fibrillation and age.  She tolerated hip surgery and knee surgery recently.  She uses her exercise bike without ischemic symptoms.  No indication for any additional testing.  Would avoid general anesthesia if at all possible due to her age and heart failure."  PharmD reviewed her chart and recommended "Per office protocol, patient can hold Eliquis for 3 days prior to procedure." We typically advise that blood thinners be resumed when felt safe by performing physician.  Will route this bundled recommendation to requesting provider via Epic fax function. Please call with questions.   Laurann Montana, PA-C 05/21/2020, 4:11 PM

## 2020-05-28 ENCOUNTER — Ambulatory Visit: Payer: Medicare Other | Admitting: Internal Medicine

## 2020-05-28 DIAGNOSIS — Z5181 Encounter for therapeutic drug level monitoring: Secondary | ICD-10-CM | POA: Diagnosis not present

## 2020-05-29 ENCOUNTER — Telehealth: Payer: Self-pay | Admitting: Family Medicine

## 2020-05-29 LAB — BASIC METABOLIC PANEL
BUN/Creatinine Ratio: 17 (ref 12–28)
BUN: 13 mg/dL (ref 8–27)
CO2: 22 mmol/L (ref 20–29)
Calcium: 9.5 mg/dL (ref 8.7–10.3)
Chloride: 95 mmol/L — ABNORMAL LOW (ref 96–106)
Creatinine, Ser: 0.75 mg/dL (ref 0.57–1.00)
GFR calc Af Amer: 83 mL/min/{1.73_m2} (ref >59)
GFR calc non Af Amer: 72 mL/min/{1.73_m2} (ref >59)
Glucose: 90 mg/dL (ref 65–99)
Potassium: 5.1 mmol/L (ref 3.5–5.2)
Sodium: 132 mmol/L — ABNORMAL LOW (ref 134–144)

## 2020-05-29 NOTE — Telephone Encounter (Signed)
Opened in error

## 2020-06-05 NOTE — Progress Notes (Signed)
DUE TO COVID-19 ONLY ONE VISITOR IS ALLOWED TO COME WITH YOU AND STAY IN THE WAITING ROOM ONLY DURING PRE OP AND PROCEDURE DAY OF SURGERY. THE 1 VISITOR  MAY VISIT WITH YOU AFTER SURGERY IN YOUR PRIVATE ROOM DURING VISITING HOURS ONLY!  YOU NEED TO HAVE A COVID 19 TEST ON_3/4/22 ______ @_______ , THIS TEST MUST BE DONE BEFORE SURGERY,  COVID TESTING SITE [address] [place] , IT IS ON THE RIGHT GOING OUT [address] APPROXIMATELY  2 MINUTES PAST [employer] ON THE RIGHT. ONCE YOUR COVID TEST IS COMPLETED,  PLEASE BEGIN THE QUARANTINE INSTRUCTIONS AS OUTLINED IN YOUR HANDOUT.                [name]  06/05/2020   Your procedure is scheduled on: 06/16/20    Report to [facility] Main  Entrance   Report to admitting at     0730 AM     Call this number if you have problems the morning of surgery [phone]    REMEMBER: NO  SOLID FOOD CANDY OR GUM AFTER MIDNIGHT. CLEAR LIQUIDS UNTIL    0650am      . NOTHING BY MOUTH EXCEPT CLEAR LIQUIDS UNTIL    . PLEASE FINISH ENSURE DRINK PER SURGEON ORDER  WHICH NEEDS TO BE COMPLETED AT      .  0650am     CLEAR LIQUID DIET   Foods Allowed                                                                    Coffee and tea, regular and decaf                            Fruit ices (not with fruit pulp)                                      Iced Popsicles                                    Carbonated beverages, regular and diet                                    Cranberry, grape and apple juices Sports drinks like Gatorade Lightly seasoned clear broth or consume(fat free) Sugar, honey syrup ___________________________________________________________________      BRUSH YOUR TEETH MORNING OF SURGERY AND RINSE YOUR MOUTH OUT, NO CHEWING GUM CANDY OR MINTS.     Take these medicines the morning of surgery with A SIP OF WATER: Wellbutrin, lanoxin, toprol, Myrbetriq, Zoloft   DO NOT TAKE ANY DIABETIC  MEDICATIONS DAY OF YOUR SURGERY                               You may not have any metal on your body including hair pins and  piercings  Do not wear jewelry, make-up, lotions, powders or perfumes, deodorant             Do not wear nail polish on your fingernails.  Do not shave  48 hours prior to surgery.              Men may shave face and neck.   Do not bring valuables to the hospital. [place].  Contacts, dentures or bridgework may not be worn into surgery.  Leave suitcase in the car. After surgery it may be brought to your room.     Patients discharged the day of surgery will not be allowed to drive home. IF YOU ARE HAVING SURGERY AND GOING HOME THE SAME DAY, YOU MUST HAVE AN ADULT TO DRIVE YOU HOME AND BE WITH YOU FOR 24 HOURS. YOU MAY GO HOME BY TAXI OR UBER OR ORTHERWISE, BUT AN ADULT MUST ACCOMPANY YOU HOME AND STAY WITH YOU FOR 24 HOURS.  Name and phone number of your driver:  Special Instructions: N/A              Please read over the following fact sheets you were given: _____________________________________________________________________  [facility] - Preparing for Surgery Before surgery, you can play an important role.  Because skin is not sterile, your skin needs to be as free of germs as possible.  You can reduce the number of germs on your skin by washing with CHG (chlorahexidine gluconate) soap before surgery.  CHG is an antiseptic cleaner which kills germs and bonds with the skin to continue killing germs even after washing. Please DO NOT use if you have an allergy to CHG or antibacterial soaps.  If your skin becomes reddened/irritated stop using the CHG and inform your nurse when you arrive at Short Stay. Do not shave (including legs and underarms) for at least 48 hours prior to the first CHG shower.  You may shave your face/neck. Please follow these instructions carefully:  1.  Shower with CHG Soap the night  before surgery and the  morning of Surgery.  2.  If you choose to wash your hair, wash your hair first as usual with your  normal  shampoo.  3.  After you shampoo, rinse your hair and body thoroughly to remove the  shampoo.                           4.  Use CHG as you would any other liquid soap.  You can apply chg directly  to the skin and wash                       Gently with a scrungie or clean washcloth.  5.  Apply the CHG Soap to your body ONLY FROM THE NECK DOWN.   Do not use on face/ open                           Wound or open sores. Avoid contact with eyes, ears mouth and genitals (private parts).                       Wash face,  Genitals (private parts) with your normal soap.             6.  Wash thoroughly, paying special attention to the area where your surgery  will be performed.  7.  Thoroughly rinse your body with warm water from the neck down.  8.  DO NOT shower/wash with your normal soap after using and rinsing off  the CHG Soap.                9.  Pat yourself dry with a clean towel.            10.  Wear clean pajamas.            11.  Place clean sheets on your bed the night of your first shower and do not  sleep with pets. Day of Surgery : Do not apply any lotions/deodorants the morning of surgery.  Please wear clean clothes to the hospital/surgery center.  FAILURE TO FOLLOW THESE INSTRUCTIONS MAY RESULT IN THE CANCELLATION OF YOUR SURGERY PATIENT SIGNATURE_________________________________  NURSE SIGNATURE__________________________________  ________________________________________________________________________

## 2020-06-08 ENCOUNTER — Other Ambulatory Visit: Payer: Self-pay | Admitting: Orthopaedic Surgery

## 2020-06-09 ENCOUNTER — Ambulatory Visit (HOSPITAL_COMMUNITY)
Admission: RE | Admit: 2020-06-09 | Discharge: 2020-06-09 | Disposition: A | Discharge status: Home / Self Care | Payer: Medicare Other | Source: Ambulatory Visit | Attending: Orthopaedic Surgery | Admitting: Orthopaedic Surgery

## 2020-06-09 ENCOUNTER — Encounter (HOSPITAL_COMMUNITY): Payer: Self-pay

## 2020-06-09 ENCOUNTER — Encounter (HOSPITAL_COMMUNITY)
Admission: RE | Admit: 2020-06-09 | Discharge: 2020-06-09 | Disposition: A | Discharge status: Home / Self Care | Payer: Medicare Other | Source: Ambulatory Visit | Attending: Orthopaedic Surgery | Admitting: Orthopaedic Surgery

## 2020-06-09 ENCOUNTER — Other Ambulatory Visit: Payer: Self-pay

## 2020-06-09 DIAGNOSIS — I517 Cardiomegaly: Secondary | ICD-10-CM | POA: Insufficient documentation

## 2020-06-09 DIAGNOSIS — Z7901 Long term (current) use of anticoagulants: Secondary | ICD-10-CM | POA: Insufficient documentation

## 2020-06-09 DIAGNOSIS — Z79899 Other long term (current) drug therapy: Secondary | ICD-10-CM | POA: Insufficient documentation

## 2020-06-09 DIAGNOSIS — I509 Heart failure, unspecified: Secondary | ICD-10-CM | POA: Insufficient documentation

## 2020-06-09 DIAGNOSIS — Z01818 Encounter for other preprocedural examination: Secondary | ICD-10-CM | POA: Insufficient documentation

## 2020-06-09 DIAGNOSIS — I272 Pulmonary hypertension, unspecified: Secondary | ICD-10-CM | POA: Diagnosis not present

## 2020-06-09 DIAGNOSIS — I428 Other cardiomyopathies: Secondary | ICD-10-CM | POA: Insufficient documentation

## 2020-06-09 DIAGNOSIS — S83001A Unspecified subluxation of right patella, initial encounter: Secondary | ICD-10-CM | POA: Insufficient documentation

## 2020-06-09 DIAGNOSIS — X58XXXA Exposure to other specified factors, initial encounter: Secondary | ICD-10-CM | POA: Insufficient documentation

## 2020-06-09 DIAGNOSIS — J90 Pleural effusion, not elsewhere classified: Secondary | ICD-10-CM | POA: Insufficient documentation

## 2020-06-09 DIAGNOSIS — I447 Left bundle-branch block, unspecified: Secondary | ICD-10-CM | POA: Diagnosis not present

## 2020-06-09 DIAGNOSIS — I4891 Unspecified atrial fibrillation: Secondary | ICD-10-CM | POA: Insufficient documentation

## 2020-06-09 LAB — CBC WITH DIFFERENTIAL/PLATELET
Abs Immature Granulocytes: 0.03 10*3/uL (ref 0.00–0.07)
Basophils Absolute: 0 10*3/uL (ref 0.0–0.1)
Basophils Relative: 1 %
Eosinophils Absolute: 0 10*3/uL (ref 0.0–0.5)
Eosinophils Relative: 0 %
HCT: 43.3 % (ref 36.0–46.0)
Hemoglobin: 13.4 g/dL (ref 12.0–15.0)
Immature Granulocytes: 0 %
Lymphocytes Relative: 16 %
Lymphs Abs: 1.2 10*3/uL (ref 0.7–4.0)
MCH: 29.5 pg (ref 26.0–34.0)
MCHC: 30.9 g/dL (ref 30.0–36.0)
MCV: 95.2 fL (ref 80.0–100.0)
Monocytes Absolute: 0.3 10*3/uL (ref 0.1–1.0)
Monocytes Relative: 4 %
Neutro Abs: 5.7 10*3/uL (ref 1.7–7.7)
Neutrophils Relative %: 79 %
Platelets: 267 10*3/uL (ref 150–400)
RBC: 4.55 MIL/uL (ref 3.87–5.11)
RDW: 14.6 % (ref 11.5–15.5)
WBC: 7.2 10*3/uL (ref 4.0–10.5)
nRBC: 0 % (ref 0.0–0.2)

## 2020-06-09 LAB — PROTIME-INR
INR: 1.2 (ref 0.8–1.2)
Prothrombin Time: 15.1 seconds (ref 11.4–15.2)

## 2020-06-09 LAB — SURGICAL PCR SCREEN
MRSA, PCR: NEGATIVE
Staphylococcus aureus: NEGATIVE

## 2020-06-09 LAB — BASIC METABOLIC PANEL
Anion gap: 10 (ref 5–15)
BUN: 18 mg/dL (ref 8–23)
CO2: 26 mmol/L (ref 22–32)
Calcium: 9.4 mg/dL (ref 8.9–10.3)
Chloride: 100 mmol/L (ref 98–111)
Creatinine, Ser: 0.72 mg/dL (ref 0.44–1.00)
GFR, Estimated: >60 mL/min (ref >60)
Glucose, Bld: 107 mg/dL — ABNORMAL HIGH (ref 70–99)
Potassium: 3.9 mmol/L (ref 3.5–5.1)
Sodium: 136 mmol/L (ref 135–145)

## 2020-06-09 LAB — APTT: aPTT: 36 seconds (ref 24–36)

## 2020-06-09 NOTE — Progress Notes (Addendum)
      Anesthesia Review:  PCP: Dr Crawford Givens LOV 04/30/20  Cardiologist : DR Duke Salvia Clearance 05/21/20 in telephone encounter  LOV 05/20/20  Chest x-ray : 06/09/20 CXR done 06/09/20 routed to Dr Jerl Santos.  EKG : 05/20/20  Echo :07/2019  Stress test: Cardiac Cath :  Activity level: cannot do a flight of stairs without difficulty  Sleep Study/ CPAP : no  Fasting Blood Sugar :      / Checks Blood Sugar -- times a day:   Blood Thinner/ Instructions /Last Dose: ASA / Instructions/ Last Dose :  Eliquis- to stop 3 days prior to surgery per pt

## 2020-06-09 NOTE — Progress Notes (Signed)
Attempted to obtain urinalysis on 06/09/2020 at preop.  Pt missed the hat in toilet when obtaining urine and urine into toilet.  No urinalysis was obtained at preop.

## 2020-06-10 NOTE — Anesthesia Preprocedure Evaluation (Addendum)
Anesthesia Evaluation  Patient identified by MRN, date of birth, ID band Patient awake    Reviewed: Allergy & Precautions, H&P , NPO status , Patient's Chart, lab work & pertinent test results  Airway Mallampati: II  TM Distance: >3 FB Neck ROM: Full    Dental no notable dental hx.    Pulmonary neg pulmonary ROS,    Pulmonary exam normal breath sounds clear to auscultation       Cardiovascular +CHF  + dysrhythmias Atrial Fibrillation  Rhythm:Irregular Rate:Normal + Systolic murmurs Moderate to severe pulmonary hypertension (HCC) 11/26/2019   1. Left ventricular ejection fraction, by estimation, is 35%. The left  ventricle has moderate to severely decreased function. The left ventricle  demonstrates global hypokinesis. There is moderate left ventricular  hypertrophy. Left ventricular diastolic  parameters are indeterminate. There is the interventricular septum is  flattened in diastole ('D' shaped left ventricle), consistent with right  ventricular volume overload.  2. Right ventricular systolic function is moderately reduced. The right  ventricular size is moderately enlarged. There is severely elevated  pulmonary artery systolic pressure. The estimated right ventricular  systolic pressure is 83.2 mmHg.  3. Left atrial size was mildly dilated.  4. Right atrial size was severely dilated.  5. The mitral valve is normal in structure. No evidence of mitral valve  regurgitation. No evidence of mitral stenosis.  6. The tricuspid valve is degenerative. Tricuspid valve regurgitation is  severe.    Neuro/Psych negative neurological ROS  negative psych ROS   GI/Hepatic negative GI ROS, Neg liver ROS,   Endo/Other  negative endocrine ROS  Renal/GU negative Renal ROS  negative genitourinary   Musculoskeletal negative musculoskeletal ROS (+)   Abdominal   Peds negative pediatric ROS (+)  Hematology negative hematology  ROS (+)   Anesthesia Other Findings   Reproductive/Obstetrics negative OB ROS                            Anesthesia Physical Anesthesia Plan  ASA: III  Anesthesia Plan: Spinal   Post-op Pain Management:  Regional for Post-op pain   Induction: Intravenous  PONV Risk Score and Plan: 2 and Ondansetron, Dexamethasone and Treatment may vary due to age or medical condition  Airway Management Planned: Simple Face Mask  Additional Equipment:   Intra-op Plan:   Post-operative Plan:   Informed Consent: I have reviewed the patients History and Physical, chart, labs and discussed the procedure including the risks, benefits and alternatives for the proposed anesthesia with the patient or authorized representative who has indicated his/her understanding and acceptance.     Dental advisory given  Plan Discussed with: CRNA and Surgeon  Anesthesia Plan Comments: (See PAT note 06/09/2020, Jodell Cipro, PA-C)       Anesthesia Quick Evaluation

## 2020-06-10 NOTE — Progress Notes (Signed)
Anesthesia Chart Review   Case: [id number] Date/Time: 06/16/20 1120   Procedure: RIGHT TOTAL KNEE ARTHROPLASTY RETINACULAR REPAIR (Right Knee)   Anesthesia type: Spinal   Pre-op diagnosis: RIGHT KNEE PATELLA SUBLUXATION   [facility] ROOM 07 / [facility]   Surgeons: [name], MD      DISCUSSION:87 y.o. never smoker with h/o GERD, CHF, A-fib (on Eliquis), nonischemic cardiomyopathy (EF 35% on Echo 08/05/19), severe pulmonary HTN, chronic LBBB, right knee patella subluxation scheduled for above procedure 06/16/2020 with Dr. [name].   Per cardiology preoperative evaluation 05/21/2020, "Chart revisited as part of pre-operative protocol coverage. To summarize, Dr. Duke Salvia assessed patient yesterday for pre-operative risk. The note indicates surgery on L knee but clearance clarifies it is the right. This clearance can still be utilized for this procedure. Per Dr. Leonides Sake note: "Her main risks are severe pulmonary hypertension, chronic systolic and diastolic heart failure, atrial fibrillation and age. She tolerated hip surgery and knee surgery recently.She uses her exercise bike without ischemic symptoms. No indication for any additional testing. Would avoid general anesthesia if at all possible due to her age and heart failure."  PharmD reviewed her chart and recommended "Per office protocol, patient can holdEliquisfor 3days prior to procedure." We typically advise that blood thinners be resumed when felt safe by performing physician."  S/p right knee arthroplasty 02/18/2020 with no anesthesia complications noted.   Anticipate pt can proceed with planned procedure barring acute status change.   VS: BP 140/81   Pulse 77   Temp 36.9 C (Oral)   Resp 16   SpO2 96%   PROVIDERS: [name], MD is PCP   [name], MD is Cardiologist  LABS: Labs reviewed: Acceptable for surgery. (all labs ordered are listed, but only abnormal results are displayed)  Labs Reviewed   BASIC METABOLIC PANEL - Abnormal; Notable for the following components:      Result Value   Glucose, Bld 107 (*)    All other components within normal limits  SURGICAL PCR SCREEN  CBC WITH DIFFERENTIAL/PLATELET  PROTIME-INR  APTT  TYPE AND SCREEN     IMAGES:   EKG: 05/20/2020 Rate 77 bpm  Atrial fibrillation  LBBB T wave abnormality, consider lateral ischemia  CV: Echo 08/02/2019 IMPRESSIONS    1. Left ventricular ejection fraction, by estimation, is 35%. The left  ventricle has moderate to severely decreased function. The left ventricle  demonstrates global hypokinesis. There is moderate left ventricular  hypertrophy. Left ventricular diastolic  parameters are indeterminate. There is the interventricular septum is  flattened in diastole ('D' shaped left ventricle), consistent with right  ventricular volume overload.  2. Right ventricular systolic function is moderately reduced. The right  ventricular size is moderately enlarged. There is severely elevated  pulmonary artery systolic pressure. The estimated right ventricular  systolic pressure is 83.2 mmHg.  3. Left atrial size was mildly dilated.  4. Right atrial size was severely dilated.  5. The mitral valve is normal in structure. No evidence of mitral valve  regurgitation. No evidence of mitral stenosis.  6. The tricuspid valve is degenerative. Tricuspid valve regurgitation is  severe.  7. The aortic valve is grossly normal. Aortic valve regurgitation is not  visualized. No aortic stenosis is present.  8. The inferior vena cava is dilated in size with <50% respiratory  variability, suggesting right atrial pressure of 15 mmHg.   Past Medical History:  Diagnosis Date  . Acute combined systolic and diastolic heart failure (HCC) 05/02/2017  .  Anemia   . Anxiety   . Arthritis    "knees" (06/18/2015)  . Atrial fibrillation with RVR (HCC) 06/18/2015   h/o sig bleed on coumadin  . Cardiogenic shock (HCC)  05/02/2017  . CHF (congestive heart failure) (HCC)   . Complication of anesthesia 2010   "w/cardiac cath; got into a psychotic state for ~ 3 days; got me out w/Valium"  . Depression    "I have it off and on; not as often as I've gotten older" (06/18/2015)  . Diverticulosis    descending and sigmoid colon--Dr. Jarold Motto  . DJD (degenerative joint disease) of knee    left  . Dyspnea    with exertion and in morning mostly when wakes up   . Dysrhythmia   . E coli infection    History of  . GERD (gastroesophageal reflux disease)   . History of Clostridium difficile colitis   . IBS (irritable bowel syndrome)   . Insomnia     off chronic ambien 5mg  as of 10/11, rare/episodic use since then  DCM/CHF  . LBBB (left bundle branch block)   . Lymphocytic colitis 09/27/2016  . Moderate to severe pulmonary hypertension (HCC) 11/26/2019  . Nonischemic cardiomyopathy (HCC)    normal coronaries, EF 15-20% 04/2008, EF 50-55% 2015  . Osteoporosis    on DXA 05/2010  . Positive PPD   . Retroperitoneal bleed   . Sciatica    Right    Past Surgical History:  Procedure Laterality Date  . ANTERIOR APPROACH HEMI HIP ARTHROPLASTY Left 08/05/2019   Procedure: ANTERIOR APPROACH HEMI HIP ARTHROPLASTY;  Surgeon: 08/07/2019, MD;  Location: [facility];  Service: Orthopedics;  Laterality: Left;  . BIOPSY  11/22/2017   Procedure: BIOPSY;  Surgeon: 11/24/2017, MD;  Location: [facility];  Service: Endoscopy;;  . CARDIAC CATHETERIZATION  2010  . CARDIOVERSION N/A 06/19/2015   Procedure: CARDIOVERSION;  Surgeon: 08/19/2015, MD;  Location[facility] ENDOSCOPY;  Service: Cardiovascular;  Laterality: N/A;  . CATARACT EXTRACTION W/ INTRAOCULAR LENS  IMPLANT, BILATERAL Bilateral   . COLONOSCOPY WITH PROPOFOL N/A 11/22/2017   Procedure: COLONOSCOPY WITH PROPOFOL;  Surgeon: 11/24/2017, MD;  Location: [facility];  Service: Endoscopy;  Laterality: N/A;  . DILATION AND CURETTAGE OF UTERUS    . TEE WITHOUT CARDIOVERSION N/A  06/19/2015   Procedure: TRANSESOPHAGEAL ECHOCARDIOGRAM (TEE);  Surgeon: 08/19/2015, MD;  Location: [facility] ENDOSCOPY;  Service: Cardiovascular;  Laterality: N/A;  . TOTAL KNEE ARTHROPLASTY Left 04/25/2017   Procedure: TOTAL KNEE ARTHROPLASTY;  Surgeon: 04/27/2017, MD;  Location: [facility];  Service: Orthopedics;  Laterality: Left;  . TOTAL KNEE ARTHROPLASTY Right 02/18/2020   Procedure: RIGHT TOTAL KNEE ARTHROPLASTY;  Surgeon: 13/12/2019, MD;  Location[facility];  Service: Orthopedics;  Laterality: Right;  . TOTAL KNEE REVISION Left 12/03/2019   Procedure: LEFT TOTAL KNEE REVISION  OF POYLY EXCHANGE;  Surgeon: 12/05/2019, MD;  Location[facility];  Service: Orthopedics;  Laterality: Left;  [name] VAGINAL HYSTERECTOMY  1979   ovaries intact    MEDICATIONS: . acetaminophen (TYLENOL) 500 MG tablet  . BIOTIN PO  . buPROPion (WELLBUTRIN SR) 100 MG 12 hr tablet  . calcium carbonate (TUMS - DOSED IN MG ELEMENTAL CALCIUM) 500 MG chewable tablet  . cholecalciferol (VITAMIN D3) 25 MCG (1000 UNIT) tablet  . digoxin (LANOXIN) 0.125 MG tablet  . ELIQUIS 2.5 MG TABS tablet  . KLOR-CON M20 20 MEQ tablet  . loperamide (IMODIUM) 2 MG capsule  .  losartan (COZAAR) 25 MG tablet  . melatonin 5 MG TABS  . Menthol-Methyl Salicylate (SALONPAS PAIN RELIEF PATCH EX)  . metoprolol succinate (TOPROL-XL) 100 MG 24 hr tablet  . mirabegron ER (MYRBETRIQ) 25 MG TB24 tablet  . MYRBETRIQ 50 MG TB24 tablet  . phenylephrine (NEO-SYNEPHRINE) 1 % nasal spray  . Polyethyl Glycol-Propyl Glycol (SYSTANE OP)  . Polyethyl Glycol-Propyl Glycol (SYSTANE) 0.4-0.3 % GEL ophthalmic gel  . predniSONE (DELTASONE) 5 MG tablet  . sertraline (ZOLOFT) 50 MG tablet  . tizanidine (ZANAFLEX) 2 MG capsule  . traMADol (ULTRAM) 50 MG tablet  . trolamine salicylate (ASPERCREME) 10 % cream   No current facility-administered medications for this encounter.    Jodell Cipro, PA-C [facility]Surgical [facility] [phone]

## 2020-06-11 ENCOUNTER — Other Ambulatory Visit: Payer: Self-pay | Admitting: Internal Medicine

## 2020-06-11 NOTE — Telephone Encounter (Signed)
Please advise Sir, thank you. 

## 2020-06-12 ENCOUNTER — Other Ambulatory Visit (HOSPITAL_COMMUNITY)
Admission: RE | Admit: 2020-06-12 | Discharge: 2020-06-12 | Disposition: A | Discharge status: Home / Self Care | Payer: Medicare Other | Source: Ambulatory Visit | Attending: Orthopaedic Surgery | Admitting: Orthopaedic Surgery

## 2020-06-12 DIAGNOSIS — Z20822 Contact with and (suspected) exposure to covid-19: Secondary | ICD-10-CM | POA: Insufficient documentation

## 2020-06-12 DIAGNOSIS — Z01812 Encounter for preprocedural laboratory examination: Secondary | ICD-10-CM | POA: Diagnosis not present

## 2020-06-12 LAB — SARS CORONAVIRUS 2 (TAT 6-24 HRS): SARS Coronavirus 2: NEGATIVE

## 2020-06-12 NOTE — Telephone Encounter (Signed)
I spoke with [name] and she said she is only taking the prednisone every other day and she hopes to wean off. She doesn't need any. Her knee is dislocated and she is to have surgery next week. She was in the car and doesn't have her calendar, she is going to call back to set up a May appointment.

## 2020-06-12 NOTE — Telephone Encounter (Signed)
Am ok to rx BUT find out exactly how much she is taking  She is having knee replacement 3/8  She was supposed to have come to see me and has not  I do want her to follow-up but will need to be some time after TKR so ask her to get a May appointment

## 2020-06-13 ENCOUNTER — Encounter: Payer: Self-pay | Admitting: Family Medicine

## 2020-06-15 MED ORDER — TRANEXAMIC ACID 1000 MG/10ML IV SOLN
2000.0000 mg | INTRAVENOUS | Status: DC
  Filled 2020-06-15: qty 20

## 2020-06-15 MED ORDER — BUPIVACAINE LIPOSOME 1.3 % IJ SUSP
20.0000 mL | Freq: Once | INTRAMUSCULAR | Status: DC
  Filled 2020-06-15: qty 20

## 2020-06-15 NOTE — H&P (Signed)
[name] is an 87 y.o. female.   Chief Complaint: right knee pain and instability HPI: [name] is here today with her husband Dorene Sorrow for follow-up of her right knee.  Unfortunately over the past few weeks she noticed that her knee is starting to sublux out.  She did have pain with standing and bending and walking.  She does not remember any new recent falls but she did have a bad fall in the bathroom when I saw her last time.  This is limiting her ability to walk.  She is afraid her leg is going to give way and she is going to fall.    Radiographs:  X-rays that were ordered, performed, and interpreted by me today included 3 views of the right knee show a lateral subluxation and tilt of the patella on the sunrise view.  Past Medical History:  Diagnosis Date  . Acute combined systolic and diastolic heart failure (HCC) 05/02/2017  . Anemia   . Anxiety   . Arthritis    "knees" (06/18/2015)  . Atrial fibrillation with RVR (HCC) 06/18/2015   h/o sig bleed on coumadin  . Cardiogenic shock (HCC) 05/02/2017  . CHF (congestive heart failure) (HCC)   . Complication of anesthesia 2010   "w/cardiac cath; got into a psychotic state for ~ 3 days; got me out w/Valium"  . Depression    "I have it off and on; not as often as I've gotten older" (06/18/2015)  . Diverticulosis    descending and sigmoid colon--Dr. Jarold Motto  . DJD (degenerative joint disease) of knee    left  . Dyspnea    with exertion and in morning mostly when wakes up   . Dysrhythmia   . E coli infection    History of  . GERD (gastroesophageal reflux disease)   . History of Clostridium difficile colitis   . IBS (irritable bowel syndrome)   . Insomnia     off chronic ambien 5mg  as of 10/11, rare/episodic use since then  DCM/CHF  . LBBB (left bundle branch block)   . Lymphocytic colitis 09/27/2016  . Moderate to severe pulmonary hypertension (HCC) 11/26/2019  . Nonischemic cardiomyopathy (HCC)    normal coronaries, EF 15-20% 04/2008, EF  50-55% 2015  . Osteoporosis    on DXA 05/2010  . Positive PPD   . Retroperitoneal bleed   . Sciatica    Right    Past Surgical History:  Procedure Laterality Date  . ANTERIOR APPROACH HEMI HIP ARTHROPLASTY Left 08/05/2019   Procedure: ANTERIOR APPROACH HEMI HIP ARTHROPLASTY;  Surgeon: 08/07/2019, MD;  Location: [facility] OR;  Service: Orthopedics;  Laterality: Left;  . BIOPSY  11/22/2017   Procedure: BIOPSY;  Surgeon: 11/24/2017, MD;  Location: [facility] ENDOSCOPY;  Service: Endoscopy;;  . CARDIAC CATHETERIZATION  2010  . CARDIOVERSION N/A 06/19/2015   Procedure: CARDIOVERSION;  Surgeon: 08/19/2015, MD;  Location: [facility] ENDOSCOPY;  Service: Cardiovascular;  Laterality: N/A;  . CATARACT EXTRACTION W/ INTRAOCULAR LENS  IMPLANT, BILATERAL Bilateral   . COLONOSCOPY WITH PROPOFOL N/A 11/22/2017   Procedure: COLONOSCOPY WITH PROPOFOL;  Surgeon: 11/24/2017, MD;  Location: [facility] ENDOSCOPY;  Service: Endoscopy;  Laterality: N/A;  . DILATION AND CURETTAGE OF UTERUS    . TEE WITHOUT CARDIOVERSION N/A 06/19/2015   Procedure: TRANSESOPHAGEAL ECHOCARDIOGRAM (TEE);  Surgeon: 08/19/2015, MD;  Location: [facility] ENDOSCOPY;  Service: Cardiovascular;  Laterality: N/A;  . TOTAL KNEE ARTHROPLASTY Left 04/25/2017   Procedure: TOTAL KNEE ARTHROPLASTY;  Surgeon:  [name], MD;  Location: [facility] OR;  Service: Orthopedics;  Laterality: Left;  . TOTAL KNEE ARTHROPLASTY Right 02/18/2020   Procedure: RIGHT TOTAL KNEE ARTHROPLASTY;  Surgeon: [name], MD;  Location: [facility] ORS;  Service: Orthopedics;  Laterality: Right;  . TOTAL KNEE REVISION Left 12/03/2019   Procedure: LEFT TOTAL KNEE REVISION  OF POYLY EXCHANGE;  Surgeon: [name], MD;  Location: [facility] ORS;  Service: Orthopedics;  Laterality: Left;  [name] VAGINAL HYSTERECTOMY  1979   ovaries intact    Family History  Problem Relation Age of Onset  . Colon cancer Father        possible colon cancer  . Other Father        esophagus tumor?  . Tuberculosis Mother    . Breast cancer Neg Hx   . Stomach cancer Neg Hx   . Pancreatic cancer Neg Hx    Social History:  reports that she has never smoked. She has never used smokeless tobacco. She reports that she does not drink alcohol and does not use drugs.  Allergies:  Allergies  Allergen Reactions  . Ace Inhibitors Cough  . Warfarin Sodium Other (See Comments)    DOSE RELATED PHARMACOLOGIC EFFECT "bleed out"  . Famotidine Other (See Comments)    GI upset  . Codeine Other (See Comments)    sedation  . Delsym [Dextromethorphan Polistirex Er] Other (See Comments)    dizziness  . Lactose Intolerance (Gi) Diarrhea  . Phenylephrine Palpitations and Other (See Comments)    Nasal spray- "likely increase in nasal congestion".   . Sulfamethoxazole-Trimethoprim Nausea And Vomiting    GI intolerance.    No medications prior to admission.    No results found for this or any previous visit (from the past 48 hour(s)). No results found.  Review of Systems  Musculoskeletal: Positive for arthralgias.       Right knee  All other systems reviewed and are negative.   There were no vitals taken for this visit. Physical Exam Constitutional:      Appearance: Normal appearance.  HENT:     Head: Normocephalic and atraumatic.     Nose: Nose normal.     Mouth/Throat:     Pharynx: Oropharynx is clear.  Eyes:     Extraocular Movements: Extraocular movements intact.  Cardiovascular:     Rate and Rhythm: Normal rate.  Pulmonary:     Effort: Pulmonary effort is normal.  Abdominal:     Palpations: Abdomen is soft.  Musculoskeletal:     Comments: Examination of the right knee shows range of motion from 0 to almost 120 of flexion.  Surgical incision is well-healed.  Her patella will sublux in and out of the joint laterally.  She does have good stability with varus and valgus stressing of her knee.  Her calf is soft and nontender.  She is neurovascularly intact distally.  Skin:    General: Skin is warm and  dry.  Neurological:     General: No focal deficit present.     Mental Status: She is alert and oriented to person, place, and time.  Psychiatric:        Mood and Affect: Mood normal.        Behavior: Behavior normal.        Thought Content: Thought content normal.        Judgment: Judgment normal.      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   Plan: After discussing with the patient we did offer to put her into a PSO brace to try and help stabilize this.  When she fell about a month ago I think she must have torn her retinaculum loose in her knee which is causing her patella to sublux laterally.  Due to her significantly high fall risk since she cannot trust her knee we reviewed the risk of anesthesia, infection, DVT, bleeding related to a right knee retinacular repair.  She is in agreement and would like to proceed.  We are going to put her into a knee immobilizer postoperatively for at least a month and then get her started in some therapy.  We will see her as scheduled for upcoming surgery.  She can call with any questions or concerns in the interim.  Ginger Organ Nida, PA-C 06/15/2020, 9:42 AM

## 2020-06-16 ENCOUNTER — Encounter (HOSPITAL_COMMUNITY)
Admission: RE | Disposition: A | Discharge status: Home / Self Care | Payer: Self-pay | Source: Home / Self Care | Attending: Orthopaedic Surgery

## 2020-06-16 ENCOUNTER — Encounter (HOSPITAL_COMMUNITY): Payer: Self-pay | Admitting: Orthopaedic Surgery

## 2020-06-16 ENCOUNTER — Other Ambulatory Visit: Payer: Self-pay

## 2020-06-16 ENCOUNTER — Ambulatory Visit (HOSPITAL_COMMUNITY): Payer: Medicare Other | Admitting: Physician Assistant

## 2020-06-16 ENCOUNTER — Observation Stay (HOSPITAL_COMMUNITY)
Admission: RE | Admit: 2020-06-16 | Discharge: 2020-06-17 | Disposition: A | Discharge status: Home / Self Care | Payer: Medicare Other | Attending: Orthopaedic Surgery | Admitting: Orthopaedic Surgery

## 2020-06-16 ENCOUNTER — Ambulatory Visit (HOSPITAL_COMMUNITY): Payer: Medicare Other | Admitting: Certified Registered Nurse Anesthetist

## 2020-06-16 DIAGNOSIS — M2202 Recurrent dislocation of patella, left knee: Secondary | ICD-10-CM | POA: Diagnosis not present

## 2020-06-16 DIAGNOSIS — G8918 Other acute postprocedural pain: Secondary | ICD-10-CM | POA: Diagnosis not present

## 2020-06-16 DIAGNOSIS — M2201 Recurrent dislocation of patella, right knee: Secondary | ICD-10-CM | POA: Diagnosis present

## 2020-06-16 DIAGNOSIS — I5041 Acute combined systolic (congestive) and diastolic (congestive) heart failure: Secondary | ICD-10-CM | POA: Diagnosis not present

## 2020-06-16 DIAGNOSIS — Z79899 Other long term (current) drug therapy: Secondary | ICD-10-CM | POA: Diagnosis not present

## 2020-06-16 DIAGNOSIS — S83004A Unspecified dislocation of right patella, initial encounter: Secondary | ICD-10-CM | POA: Diagnosis not present

## 2020-06-16 DIAGNOSIS — Z7901 Long term (current) use of anticoagulants: Secondary | ICD-10-CM | POA: Insufficient documentation

## 2020-06-16 DIAGNOSIS — I428 Other cardiomyopathies: Secondary | ICD-10-CM | POA: Diagnosis not present

## 2020-06-16 DIAGNOSIS — I447 Left bundle-branch block, unspecified: Secondary | ICD-10-CM | POA: Diagnosis not present

## 2020-06-16 DIAGNOSIS — Z96653 Presence of artificial knee joint, bilateral: Secondary | ICD-10-CM | POA: Insufficient documentation

## 2020-06-16 HISTORY — PX: TOTAL KNEE ARTHROPLASTY: SHX125

## 2020-06-16 LAB — TYPE AND SCREEN
ABO/RH(D): O NEG
Antibody Screen: NEGATIVE

## 2020-06-16 SURGERY — ARTHROPLASTY, KNEE, TOTAL
Anesthesia: Spinal | Site: Knee | Laterality: Right

## 2020-06-16 MED ORDER — FENTANYL CITRATE (PF) 100 MCG/2ML IJ SOLN: 50.0000 ug | INTRAMUSCULAR | Status: DC

## 2020-06-16 MED ORDER — PROPOFOL 1000 MG/100ML IV EMUL
INTRAVENOUS | Status: AC
  Filled 2020-06-16: qty 100

## 2020-06-16 MED ORDER — ACETAMINOPHEN 500 MG PO TABS
500.0000 mg | ORAL_TABLET | Freq: Four times a day (QID) | ORAL | Status: AC
  Administered 2020-06-16 – 2020-06-17 (×4): 500 mg via ORAL
  Filled 2020-06-16 (×4): qty 1

## 2020-06-16 MED ORDER — ACETAMINOPHEN 10 MG/ML IV SOLN: 1000.0000 mg | Freq: Once | INTRAVENOUS | Status: DC | PRN

## 2020-06-16 MED ORDER — POLYETHYL GLYCOL-PROPYL GLYCOL 0.4-0.3 % OP GEL: 1.0000 "application " | Freq: Every day | OPHTHALMIC | Status: DC

## 2020-06-16 MED ORDER — TRANEXAMIC ACID-NACL 1000-0.7 MG/100ML-% IV SOLN
1000.0000 mg | INTRAVENOUS | Status: AC
  Administered 2020-06-16: 1000 mg via INTRAVENOUS

## 2020-06-16 MED ORDER — DOCUSATE SODIUM 100 MG PO CAPS
100.0000 mg | ORAL_CAPSULE | Freq: Two times a day (BID) | ORAL | Status: DC
  Administered 2020-06-17: 100 mg via ORAL
  Filled 2020-06-16 (×2): qty 1

## 2020-06-16 MED ORDER — DEXAMETHASONE SODIUM PHOSPHATE 10 MG/ML IJ SOLN
INTRAMUSCULAR | Status: DC | PRN
  Administered 2020-06-16: 10 mg

## 2020-06-16 MED ORDER — METOCLOPRAMIDE HCL 5 MG/ML IJ SOLN: 5.0000 mg | Freq: Three times a day (TID) | INTRAMUSCULAR | Status: DC | PRN

## 2020-06-16 MED ORDER — ACETAMINOPHEN 325 MG PO TABS: 325.0000 mg | ORAL_TABLET | Freq: Four times a day (QID) | ORAL | Status: DC | PRN

## 2020-06-16 MED ORDER — CEFAZOLIN SODIUM-DEXTROSE 2-4 GM/100ML-% IV SOLN
2.0000 g | INTRAVENOUS | Status: AC
  Administered 2020-06-16: 2 g via INTRAVENOUS

## 2020-06-16 MED ORDER — LACTATED RINGERS IV SOLN: INTRAVENOUS | Status: DC

## 2020-06-16 MED ORDER — ONDANSETRON HCL 4 MG PO TABS: 4.0000 mg | ORAL_TABLET | Freq: Three times a day (TID) | ORAL | 0 refills | Status: DC | PRN

## 2020-06-16 MED ORDER — MELATONIN 5 MG PO TABS: 2.5000 mg | ORAL_TABLET | Freq: Every evening | ORAL | Status: DC | PRN

## 2020-06-16 MED ORDER — ROPIVACAINE HCL 7.5 MG/ML IJ SOLN
INTRAMUSCULAR | Status: DC | PRN
  Administered 2020-06-16: 30 mL via PERINEURAL

## 2020-06-16 MED ORDER — LACTATED RINGERS IV SOLN
INTRAVENOUS | Status: DC
Start: 2020-06-16 — End: 2020-06-17

## 2020-06-16 MED ORDER — BUPROPION HCL ER (SR) 100 MG PO TB12
100.0000 mg | ORAL_TABLET | Freq: Every day | ORAL | Status: DC
  Administered 2020-06-17: 100 mg via ORAL
  Filled 2020-06-16: qty 1

## 2020-06-16 MED ORDER — BUPIVACAINE-EPINEPHRINE 0.25% -1:200000 IJ SOLN
INTRAMUSCULAR | Status: DC | PRN
  Administered 2020-06-16: 30 mL

## 2020-06-16 MED ORDER — BUPIVACAINE-EPINEPHRINE (PF) 0.25% -1:200000 IJ SOLN
INTRAMUSCULAR | Status: AC
  Filled 2020-06-16: qty 30

## 2020-06-16 MED ORDER — ONDANSETRON HCL 4 MG/2ML IJ SOLN: 4.0000 mg | Freq: Once | INTRAMUSCULAR | Status: DC | PRN

## 2020-06-16 MED ORDER — DIGOXIN 125 MCG PO TABS
0.1250 mg | ORAL_TABLET | Freq: Every day | ORAL | Status: DC
  Administered 2020-06-17: 0.125 mg via ORAL
  Filled 2020-06-16: qty 1

## 2020-06-16 MED ORDER — METOPROLOL SUCCINATE ER 50 MG PO TB24
100.0000 mg | ORAL_TABLET | Freq: Every day | ORAL | Status: DC
Start: 2020-06-17 — End: 2020-06-17
  Administered 2020-06-17: 100 mg via ORAL
  Filled 2020-06-16 (×2): qty 2

## 2020-06-16 MED ORDER — METHOCARBAMOL 1000 MG/10ML IJ SOLN
500.0000 mg | Freq: Four times a day (QID) | INTRAVENOUS | Status: DC | PRN
  Filled 2020-06-16: qty 5

## 2020-06-16 MED ORDER — HYDROMORPHONE HCL 1 MG/ML IJ SOLN: 0.2500 mg | INTRAMUSCULAR | Status: DC | PRN

## 2020-06-16 MED ORDER — SODIUM CHLORIDE 0.9 % IR SOLN
Status: DC | PRN
  Administered 2020-06-16: 3000 mL

## 2020-06-16 MED ORDER — SERTRALINE HCL 100 MG PO TABS
100.0000 mg | ORAL_TABLET | Freq: Every day | ORAL | Status: DC
  Administered 2020-06-16 – 2020-06-17 (×2): 100 mg via ORAL
  Filled 2020-06-16 (×3): qty 1

## 2020-06-16 MED ORDER — BUPIVACAINE LIPOSOME 1.3 % IJ SUSP
INTRAMUSCULAR | Status: DC | PRN
  Administered 2020-06-16: 20 mL

## 2020-06-16 MED ORDER — APIXABAN 2.5 MG PO TABS
2.5000 mg | ORAL_TABLET | Freq: Two times a day (BID) | ORAL | Status: DC
  Administered 2020-06-17: 2.5 mg via ORAL
  Filled 2020-06-16: qty 1

## 2020-06-16 MED ORDER — CHLORHEXIDINE GLUCONATE 0.12 % MT SOLN
15.0000 mL | Freq: Once | OROMUCOSAL | Status: AC
  Administered 2020-06-16: 15 mL via OROMUCOSAL

## 2020-06-16 MED ORDER — FENTANYL CITRATE (PF) 100 MCG/2ML IJ SOLN
INTRAMUSCULAR | Status: AC
  Administered 2020-06-16: 100 ug via INTRAVENOUS
  Filled 2020-06-16: qty 2

## 2020-06-16 MED ORDER — PHENYLEPHRINE HCL-NACL 10-0.9 MG/250ML-% IV SOLN
INTRAVENOUS | Status: AC
  Filled 2020-06-16: qty 250

## 2020-06-16 MED ORDER — PHENYLEPHRINE HCL-NACL 10-0.9 MG/250ML-% IV SOLN
INTRAVENOUS | Status: DC | PRN
  Administered 2020-06-16: 50 ug/min via INTRAVENOUS

## 2020-06-16 MED ORDER — PROPOFOL 500 MG/50ML IV EMUL
INTRAVENOUS | Status: DC | PRN
  Administered 2020-06-16: 25 ug/kg/min via INTRAVENOUS

## 2020-06-16 MED ORDER — 0.9 % SODIUM CHLORIDE (POUR BTL) OPTIME
TOPICAL | Status: DC | PRN
  Administered 2020-06-16: 1000 mL

## 2020-06-16 MED ORDER — POVIDONE-IODINE 10 % EX SWAB
2.0000 "application " | Freq: Once | CUTANEOUS | Status: AC
  Administered 2020-06-16: 2 via TOPICAL

## 2020-06-16 MED ORDER — CEFAZOLIN SODIUM-DEXTROSE 2-4 GM/100ML-% IV SOLN
INTRAVENOUS | Status: AC
  Filled 2020-06-16: qty 100

## 2020-06-16 MED ORDER — METHOCARBAMOL 500 MG PO TABS: 500.0000 mg | ORAL_TABLET | Freq: Four times a day (QID) | ORAL | Status: DC | PRN

## 2020-06-16 MED ORDER — METOCLOPRAMIDE HCL 5 MG PO TABS: 5.0000 mg | ORAL_TABLET | Freq: Three times a day (TID) | ORAL | Status: DC | PRN

## 2020-06-16 MED ORDER — PROPOFOL 10 MG/ML IV BOLUS
INTRAVENOUS | Status: DC | PRN
  Administered 2020-06-16: 20 mg via INTRAVENOUS

## 2020-06-16 MED ORDER — CEFAZOLIN SODIUM-DEXTROSE 2-4 GM/100ML-% IV SOLN
2.0000 g | Freq: Four times a day (QID) | INTRAVENOUS | Status: AC
  Administered 2020-06-16 – 2020-06-17 (×3): 2 g via INTRAVENOUS
  Filled 2020-06-16 (×3): qty 100

## 2020-06-16 MED ORDER — LOPERAMIDE HCL 2 MG PO CAPS: 2.0000 mg | ORAL_CAPSULE | Freq: Three times a day (TID) | ORAL | Status: DC | PRN

## 2020-06-16 MED ORDER — MIRABEGRON ER 25 MG PO TB24
50.0000 mg | ORAL_TABLET | Freq: Every day | ORAL | Status: DC
  Administered 2020-06-16 – 2020-06-17 (×2): 50 mg via ORAL
  Filled 2020-06-16 (×2): qty 2

## 2020-06-16 MED ORDER — BUPIVACAINE HCL (PF) 0.75 % IJ SOLN
INTRAMUSCULAR | Status: DC | PRN
  Administered 2020-06-16: 1.4 mL via INTRATHECAL

## 2020-06-16 MED ORDER — ONDANSETRON HCL 4 MG/2ML IJ SOLN: 4.0000 mg | Freq: Four times a day (QID) | INTRAMUSCULAR | Status: DC | PRN

## 2020-06-16 MED ORDER — DIPHENHYDRAMINE HCL 12.5 MG/5ML PO ELIX: 12.5000 mg | ORAL_SOLUTION | ORAL | Status: DC | PRN

## 2020-06-16 MED ORDER — KETOROLAC TROMETHAMINE 15 MG/ML IJ SOLN
7.5000 mg | Freq: Four times a day (QID) | INTRAMUSCULAR | Status: AC
  Administered 2020-06-16 – 2020-06-17 (×4): 7.5 mg via INTRAVENOUS
  Filled 2020-06-16 (×4): qty 1

## 2020-06-16 MED ORDER — TRANEXAMIC ACID-NACL 1000-0.7 MG/100ML-% IV SOLN
INTRAVENOUS | Status: AC
  Filled 2020-06-16: qty 100

## 2020-06-16 MED ORDER — ONDANSETRON HCL 4 MG PO TABS: 4.0000 mg | ORAL_TABLET | Freq: Four times a day (QID) | ORAL | Status: DC | PRN

## 2020-06-16 MED ORDER — TRAMADOL HCL 50 MG PO TABS: 50.0000 mg | ORAL_TABLET | Freq: Four times a day (QID) | ORAL | Status: DC | PRN

## 2020-06-16 MED ORDER — LOSARTAN POTASSIUM 25 MG PO TABS
25.0000 mg | ORAL_TABLET | Freq: Every day | ORAL | Status: DC
  Administered 2020-06-17: 25 mg via ORAL
  Filled 2020-06-16 (×2): qty 1

## 2020-06-16 MED ORDER — ONDANSETRON HCL 4 MG/2ML IJ SOLN
INTRAMUSCULAR | Status: DC | PRN
  Administered 2020-06-16: 4 mg via INTRAVENOUS

## 2020-06-16 MED ORDER — POTASSIUM CHLORIDE CRYS ER 20 MEQ PO TBCR
20.0000 meq | EXTENDED_RELEASE_TABLET | Freq: Every day | ORAL | Status: DC
  Administered 2020-06-16 – 2020-06-17 (×2): 20 meq via ORAL
  Filled 2020-06-16 (×3): qty 1

## 2020-06-16 MED ORDER — ARTIFICIAL TEARS OPHTHALMIC OINT
TOPICAL_OINTMENT | Freq: Every day | OPHTHALMIC | Status: DC
  Filled 2020-06-16: qty 3.5

## 2020-06-16 SURGICAL SUPPLY — 44 items
BAG DECANTER FOR FLEXI CONT (MISCELLANEOUS) ×2 IMPLANT
BAG ZIPLOCK 12X15 (MISCELLANEOUS) ×2 IMPLANT
BLADE SAGITTAL 25.0X1.19X90 (BLADE) IMPLANT
BLADE SAW SGTL 11.0X1.19X90.0M (BLADE) IMPLANT
BNDG ELASTIC 6X5.8 VLCR STR LF (GAUZE/BANDAGES/DRESSINGS) ×2 IMPLANT
BOOTIES KNEE HIGH SLOAN (MISCELLANEOUS) ×2 IMPLANT
BOWL SMART MIX CTS (DISPOSABLE) IMPLANT
COVER SURGICAL LIGHT HANDLE (MISCELLANEOUS) ×2 IMPLANT
COVER WAND RF STERILE (DRAPES) ×2 IMPLANT
CUFF TOURN SGL QUICK 34 (TOURNIQUET CUFF) ×2
CUFF TRNQT CYL 34X4.125X (TOURNIQUET CUFF) ×1 IMPLANT
DECANTER SPIKE VIAL GLASS SM (MISCELLANEOUS) IMPLANT
DRAPE ORTHO SPLIT 77X108 STRL (DRAPES)
DRAPE SHEET LG 3/4 BI-LAMINATE (DRAPES) ×2 IMPLANT
DRAPE SURG ORHT 6 SPLT 77X108 (DRAPES) IMPLANT
DRAPE TOP 10253 STERILE (DRAPES) ×2 IMPLANT
DRAPE U-SHAPE 47X51 STRL (DRAPES) ×2 IMPLANT
DRSG AQUACEL AG ADV 3.5X 6 (GAUZE/BANDAGES/DRESSINGS) ×2 IMPLANT
DRSG AQUACEL AG ADV 3.5X10 (GAUZE/BANDAGES/DRESSINGS) ×2 IMPLANT
DURAPREP 26ML APPLICATOR (WOUND CARE) ×4 IMPLANT
ELECT REM PT RETURN 15FT ADLT (MISCELLANEOUS) ×2 IMPLANT
GLOVE SRG 8 PF TXTR STRL LF DI (GLOVE) ×2 IMPLANT
GLOVE SURG ENC MOIS LTX SZ8 (GLOVE) ×4 IMPLANT
GLOVE SURG UNDER POLY LF SZ8 (GLOVE) ×4
GOWN STRL REUS W/TWL XL LVL3 (GOWN DISPOSABLE) ×4 IMPLANT
HANDPIECE INTERPULSE COAX TIP (DISPOSABLE) ×2
HOLDER FOLEY CATH W/STRAP (MISCELLANEOUS) ×2 IMPLANT
HOOD PEEL AWAY FLYTE STAYCOOL (MISCELLANEOUS) ×6 IMPLANT
KIT TURNOVER KIT A (KITS) ×2 IMPLANT
MANIFOLD NEPTUNE II (INSTRUMENTS) ×2 IMPLANT
NS IRRIG 1000ML POUR BTL (IV SOLUTION) ×2 IMPLANT
PACK TOTAL KNEE CUSTOM (KITS) ×2 IMPLANT
PAD ARMBOARD 7.5X6 YLW CONV (MISCELLANEOUS) ×2 IMPLANT
PENCIL SMOKE EVACUATOR (MISCELLANEOUS) IMPLANT
PROTECTOR NERVE ULNAR (MISCELLANEOUS) ×2 IMPLANT
SET HNDPC FAN SPRY TIP SCT (DISPOSABLE) ×1 IMPLANT
SUT ETHIBOND NAB CT1 #1 30IN (SUTURE) ×4 IMPLANT
SUT VIC AB 0 CT1 36 (SUTURE) ×2 IMPLANT
SUT VIC AB 2-0 CT1 27 (SUTURE) ×2
SUT VIC AB 2-0 CT1 TAPERPNT 27 (SUTURE) ×1 IMPLANT
SUT VICRYL AB 3-0 FS1 BRD 27IN (SUTURE) ×2 IMPLANT
TRAY FOLEY MTR SLVR 16FR STAT (SET/KITS/TRAYS/PACK) IMPLANT
WATER STERILE IRR 1000ML POUR (IV SOLUTION) ×2 IMPLANT
WRAP KNEE MAXI GEL POST OP (GAUZE/BANDAGES/DRESSINGS) ×2 IMPLANT

## 2020-06-16 NOTE — Anesthesia Procedure Notes (Signed)
Anesthesia Regional Block: Adductor canal block   Pre-Anesthetic Checklist: ,, timeout performed, Correct Patient, Correct Site, Correct Laterality, Correct Procedure, Correct Position, site marked, Risks and benefits discussed,  Surgical consent,  Pre-op evaluation,  At surgeon's request and post-op pain management  Laterality: Right  Prep: chloraprep       Needles:  Injection technique: Single-shot  Needle Type: Echogenic Stimulator Needle     Needle Length: 5cm  Needle Gauge: 22     Additional Needles:   Procedures:, nerve stimulator,,, ultrasound used (permanent image in chart),,,,  Narrative:  Start time: 06/16/2020 12:07 PM End time: 06/16/2020 12:12 PM Injection made incrementally with aspirations every 5 mL.  Performed by: Personally  Anesthesiologist: Bethena Midget, MD  Additional Notes: Functioning IV was confirmed and monitors were applied.  A 38mm 22ga Arrow echogenic stimulator needle was used. Sterile prep and drape,hand hygiene and sterile gloves were used. Ultrasound guidance: relevant anatomy identified, needle position confirmed, local anesthetic spread visualized around nerve(s)., vascular puncture avoided.  Image printed for medical record. Negative aspiration and negative test dose prior to incremental administration of local anesthetic. The patient tolerated the procedure well.

## 2020-06-16 NOTE — Anesthesia Procedure Notes (Signed)
Spinal  Patient location during procedure: OR Start time: 06/16/2020 1:17 PM End time: 06/16/2020 1:05 PM Staffing Performed: anesthesiologist  Anesthesiologist: Bethena Midget, MD Preanesthetic Checklist Completed: patient identified, IV checked, site marked, risks and benefits discussed, surgical consent, monitors and equipment checked, pre-op evaluation and timeout performed Spinal Block Patient position: sitting Prep: DuraPrep Patient monitoring: heart rate, cardiac monitor, continuous pulse ox and blood pressure Approach: midline Location: L3-4 Injection technique: single-shot Needle Needle type: Sprotte and Pencan  Needle gauge: 24 G Needle length: 10 cm Needle insertion depth: 9 cm Assessment Sensory level: T4

## 2020-06-16 NOTE — Brief Op Note (Signed)
[name] [id number] 06/16/2020   PRE-OP DIAGNOSIS: right knee patellar dislocation  POST-OP DIAGNOSIS: same  PROCEDURE: right knee retinacular repair  ANESTHESIA: spinal and block and sedation  [name]   Dictation #:  [id number]

## 2020-06-16 NOTE — Progress Notes (Signed)
AssistedDr. Oddono with right, ultrasound guided, adductor canal block. Side rails up, monitors on throughout procedure. See vital signs in flow sheet. Tolerated Procedure well.  

## 2020-06-16 NOTE — Interval H&P Note (Signed)
History and Physical Interval Note:  06/16/2020 12:08 PM  [name]  has presented today for surgery, with the diagnosis of RIGHT KNEE PATELLA SUBLUXATION.  The various methods of treatment have been discussed with the patient and family. After consideration of risks, benefits and other options for treatment, the patient has consented to  Procedure(s): RIGHT TOTAL KNEE ARTHROPLASTY RETINACULAR REPAIR (Right) as a surgical intervention.  The patient's history has been reviewed, patient examined, no change in status, stable for surgery.  I have reviewed the patient's chart and labs.  Questions were answered to the patient's satisfaction.     [name]

## 2020-06-16 NOTE — Telephone Encounter (Signed)
ERx 

## 2020-06-16 NOTE — Transfer of Care (Signed)
Immediate Anesthesia Transfer of Care Note  Patient: [name]  Procedure(s) Performed: RIGHT RETINACULAR REPAIR (Right Knee)  Patient Location: PACU  Anesthesia Type:Spinal  Level of Consciousness: awake and patient cooperative  Airway & Oxygen Therapy: Patient Spontanous Breathing and Patient connected to face mask  Post-op Assessment: Report given to RN and Post -op Vital signs reviewed and stable  Post vital signs: Reviewed and stable  Last Vitals:  Vitals Value Taken Time  BP    Temp    Pulse 68 06/16/20 1427  Resp 19 06/16/20 1427  SpO2 100 % 06/16/20 1427  Vitals shown include unvalidated device data.  Last Pain:  Vitals:   06/16/20 1000  TempSrc: Oral  PainSc:          Complications: No complications documented.

## 2020-06-16 NOTE — Anesthesia Postprocedure Evaluation (Signed)
Anesthesia Post Note  Patient: [name]  Procedure(s) Performed: RIGHT RETINACULAR REPAIR (Right Knee)     Patient location during evaluation: PACU Anesthesia Type: Spinal Level of consciousness: oriented and awake and alert Pain management: pain level controlled Vital Signs Assessment: post-procedure vital signs reviewed and stable Respiratory status: spontaneous breathing, respiratory function stable and patient connected to nasal cannula oxygen Cardiovascular status: blood pressure returned to baseline and stable Postop Assessment: no headache, no backache and no apparent nausea or vomiting Anesthetic complications: no   No complications documented.  Last Vitals:  Vitals:   06/16/20 1545 06/16/20 1608  BP: 140/85 140/90  Pulse: 80 75  Resp: 20   Temp:  36.7 C  SpO2: 99% 100%    Last Pain:  Vitals:   06/16/20 1608  TempSrc: Oral  PainSc:                  Gerell Fortson

## 2020-06-17 DIAGNOSIS — Z7901 Long term (current) use of anticoagulants: Secondary | ICD-10-CM | POA: Diagnosis not present

## 2020-06-17 DIAGNOSIS — Z79899 Other long term (current) drug therapy: Secondary | ICD-10-CM | POA: Diagnosis not present

## 2020-06-17 DIAGNOSIS — M2202 Recurrent dislocation of patella, left knee: Secondary | ICD-10-CM | POA: Diagnosis not present

## 2020-06-17 DIAGNOSIS — Z96653 Presence of artificial knee joint, bilateral: Secondary | ICD-10-CM | POA: Diagnosis not present

## 2020-06-17 DIAGNOSIS — I5041 Acute combined systolic (congestive) and diastolic (congestive) heart failure: Secondary | ICD-10-CM | POA: Diagnosis not present

## 2020-06-17 MED ORDER — TRAMADOL HCL 50 MG PO TABS: 50.0000 mg | ORAL_TABLET | Freq: Four times a day (QID) | ORAL | 0 refills | Status: DC | PRN

## 2020-06-17 NOTE — Progress Notes (Signed)
Physical Therapy Treatment Patient Details Name: [name] MRN: [id number] DOB: 12-05-1930 Today's Date: 06/17/2020    History of Present Illness Pt is an 87 year old female status post right TKR 02/18/20 with postoperative fall and retinacular failure with subluxation laterally of the patella and admitted for Right knee retinaculum repair on 06/16/20    PT Comments    Pt assisted with ambulating again in hallway and requiring min assist for several instances of LOB.  Pt reports she is sometimes unsteady like this at home.  Pt and spouse both report spouse was ambulating with pt and using gait belt to assist prior to this admission and agreeable to continue this safety practice upon d/c.  Pt would like to d/c home and discussed safety concerns however both pt and spouse feel they can manage.  Emphasized pt wear KI with any standing activity/mobility/OOB.  Pt would like to d/c home today.   Follow Up Recommendations  Home health PT;Follow surgeon's recommendation for DC plan and follow-up therapies     Equipment Recommendations  None recommended by PT    Recommendations for Other Services       Precautions / Restrictions Precautions Precautions: Knee;Fall Precaution Comments: no ROM restrictions in orders however pt educated to limited knee flexion and only gentle motion (pt states MD told her that this morning too) Required Braces or Orthoses: Knee Immobilizer - Right Knee Immobilizer - Right: On when out of bed or walking Restrictions Weight Bearing Restrictions: No    Mobility  Bed Mobility Overal bed mobility: Needs Assistance Bed Mobility: Supine to Sit     Supine to sit: Supervision;HOB elevated     General bed mobility comments: pt in recliner    Transfers Overall transfer level: Needs assistance Equipment used: Rolling walker (2 wheeled) Transfers: Sit to/from Stand Sit to Stand: Min assist         General transfer comment: verbal cues for UE and LE  positioning, assist to stabilize upon rise  Ambulation/Gait Ambulation/Gait assistance: Min assist Gait Distance (Feet): 100 Feet Assistive device: Rolling walker (2 wheeled) Gait Pattern/deviations: Decreased stride length;Step-to pattern;Antalgic     General Gait Details: verbal cues for sequence, RW positioning, posture, step length; pt more unsteady this visit and requiring 4-5 times of min assist to stabilize due to LOB   Stairs             Wheelchair Mobility    Modified Rankin (Stroke Patients Only)       Balance Overall balance assessment: History of Falls                                          Cognition Arousal/Alertness: Awake/alert Behavior During Therapy: WFL for tasks assessed/performed Overall Cognitive Status: Within Functional Limits for tasks assessed                                        Exercises      General Comments        Pertinent Vitals/Pain Pain Assessment: 0-10 Pain Score: 5  Pain Location: right knee Pain Descriptors / Indicators: Grimacing;Sore Pain Intervention(s): Repositioned;Monitored during session    Home Living Family/patient expects to be discharged to:: Private residence Living Arrangements: Spouse/significant other Available Help at Discharge: Family;Available 24 hours/day Type of Home: House  Home Access: Level entry   Home Layout: Able to live on main level with bedroom/bathroom;Two level Home Equipment: Walker - 2 wheels;Shower seat;Bedside commode;Cane - single point;Grab bars - tub/shower;Grab bars - toilet      Prior Function Level of Independence: Needs assistance  Gait / Transfers Assistance Needed: reports falls at home, spouse has been ambulating with pt and using gait belt for safety       PT Goals (current goals can now be found in the care plan section) Acute Rehab PT Goals PT Goal Formulation: With patient Time For Goal Achievement: 06/23/20 Potential to  Achieve Goals: Good Progress towards PT goals: Progressing toward goals    Frequency    7X/week      PT Plan Current plan remains appropriate    Co-evaluation              AM-PAC PT "6 Clicks" Mobility   Outcome Measure  Help needed turning from your back to your side while in a flat bed without using bedrails?: A Little Help needed moving from lying on your back to sitting on the side of a flat bed without using bedrails?: A Little Help needed moving to and from a bed to a chair (including a wheelchair)?: A Little Help needed standing up from a chair using your arms (e.g., wheelchair or bedside chair)?: A Little Help needed to walk in hospital room?: A Little Help needed climbing 3-5 steps with a railing? : A Little 6 Click Score: 18    End of Session Equipment Utilized During Treatment: Gait belt;Right knee immobilizer Activity Tolerance: Patient tolerated treatment well Patient left: in chair;with call bell/phone within reach;with family/visitor present Nurse Communication: Mobility status PT Visit Diagnosis: Other abnormalities of gait and mobility (R26.89)     Time: 4496-7591 PT Time Calculation (min) (ACUTE ONLY): 16 min  Charges:  $Gait Training: 8-22 mins                    Paulino Door, DPT Acute Rehabilitation Services Pager: [phone] Office: [phone]  Maida Sale E 06/17/2020, 12:05 PM

## 2020-06-17 NOTE — TOC Transition Note (Signed)
Transition of Care [facility]) - CM/SW Discharge Note   Patient Details  Name: [name] MRN: [id number] Date of Birth: 1930/09/14  [facility] CM/SW Contact:  Clearance Coots, LCSW Phone Number: 06/17/2020, 9:27 AM   Clinical Narrative:    Prearranged therapy plan- Home Health-[facility] now [facility].  Patient confirm DME and knowlegeable of therapy plan.   Final next level of care: Home w Home Health Services Barriers to Discharge: No Barriers Identified   Patient Goals and CMS Choice        Discharge Placement                       Discharge Plan and Services                          HH Arranged: PT HH Agency: [facility] (now [facility]) Date HH Agency Contacted: 06/17/20 Time HH Agency Contacted: [phone]    Social Determinants of Health (SDOH) Interventions     Readmission Risk Interventions Readmission Risk Prevention Plan 08/06/2019  Transportation Screening Complete  PCP or Specialist Appt within 5-7 Days Complete  Home Care Screening Complete  Medication Review (RN CM) Complete  Some recent data might be hidden

## 2020-06-17 NOTE — Evaluation (Signed)
Physical Therapy Evaluation Patient Details Name: [name] MRN: [id number] DOB: 1931-02-18 Today's Date: 06/17/2020   History of Present Illness  Pt is an 87 year old female status post right TKR 02/18/20 with postoperative fall and retinacular failure with subluxation laterally of the patella and admitted for Right knee retinaculum repair on 06/16/20  Clinical Impression  Patient is s/p above surgery resulting in functional limitations due to the deficits listed below (see PT Problem List).  Patient will benefit from skilled PT to increase their independence and safety with mobility to allow discharge to the venue listed below.  Pt assisted with ambulating in hallway.  Pt aware to wear KI brace for any mobility and can remove when at rest but keep knee in extension.  Pt eager to d/c home today.  Will return for second session.     Follow Up Recommendations Home health PT;Follow surgeon's recommendation for DC plan and follow-up therapies    Equipment Recommendations  None recommended by PT    Recommendations for Other Services       Precautions / Restrictions Precautions Precautions: Knee;Fall Precaution Comments: no ROM restrictions in orders however pt educated to limited knee flexion and only gentle motion (pt states MD told her that this morning too) Required Braces or Orthoses: Knee Immobilizer - Right Knee Immobilizer - Right: On when out of bed or walking Restrictions Weight Bearing Restrictions: No      Mobility  Bed Mobility Overal bed mobility: Needs Assistance Bed Mobility: Supine to Sit     Supine to sit: Supervision;HOB elevated     General bed mobility comments: increased time and effort    Transfers Overall transfer level: Needs assistance Equipment used: Rolling walker (2 wheeled) Transfers: Sit to/from Stand Sit to Stand: Min guard         General transfer comment: verbal cues for UE and LE positioning  Ambulation/Gait Ambulation/Gait  assistance: Min guard Gait Distance (Feet): 100 Feet Assistive device: Rolling walker (2 wheeled) Gait Pattern/deviations: Decreased stride length;Step-to pattern;Antalgic     General Gait Details: verbal cues for sequence, RW positioning, posture, step length  Stairs            Wheelchair Mobility    Modified Rankin (Stroke Patients Only)       Balance Overall balance assessment: History of Falls                                           Pertinent Vitals/Pain Pain Assessment: 0-10 Pain Score: 6  Pain Location: right knee Pain Descriptors / Indicators: Grimacing;Sore Pain Intervention(s): Repositioned;Monitored during session    Home Living Family/patient expects to be discharged to:: Private residence Living Arrangements: Spouse/significant other Available Help at Discharge: Family;Available 24 hours/day Type of Home: House Home Access: Level entry     Home Layout: Able to live on main level with bedroom/bathroom;Two level Home Equipment: Walker - 2 wheels;Shower seat;Bedside commode;Cane - single point;Grab bars - tub/shower;Grab bars - toilet      Prior Function Level of Independence: Needs assistance   Gait / Transfers Assistance Needed: reports falls at home, spouse has been ambulating with pt and using gait belt for safety           Hand Dominance        Extremity/Trunk Assessment        Lower Extremity Assessment Lower Extremity Assessment: RLE deficits/detail RLE  Deficits / Details: requiring assist to lift leg for KI application; pt educated to limit knee flexion, only gentle motion       Communication   Communication: No difficulties  Cognition Arousal/Alertness: Awake/alert Behavior During Therapy: WFL for tasks assessed/performed Overall Cognitive Status: Within Functional Limits for tasks assessed                                        General Comments      Exercises     Assessment/Plan     PT Assessment Patient needs continued PT services  PT Problem List Decreased strength;Decreased mobility;Decreased activity tolerance;Decreased balance;Decreased knowledge of use of DME;Decreased knowledge of precautions       PT Treatment Interventions Stair training;Gait training;DME instruction;Therapeutic exercise;Balance training;Therapeutic activities;Patient/family education;Functional mobility training    PT Goals (Current goals can be found in the Care Plan section)  Acute Rehab PT Goals PT Goal Formulation: With patient Time For Goal Achievement: 06/23/20 Potential to Achieve Goals: Good    Frequency 7X/week   Barriers to discharge        Co-evaluation               AM-PAC PT "6 Clicks" Mobility  Outcome Measure Help needed turning from your back to your side while in a flat bed without using bedrails?: A Little Help needed moving from lying on your back to sitting on the side of a flat bed without using bedrails?: A Little Help needed moving to and from a bed to a chair (including a wheelchair)?: A Little Help needed standing up from a chair using your arms (e.g., wheelchair or bedside chair)?: A Little Help needed to walk in hospital room?: A Little Help needed climbing 3-5 steps with a railing? : A Little 6 Click Score: 18    End of Session Equipment Utilized During Treatment: Gait belt;Right knee immobilizer Activity Tolerance: Patient tolerated treatment well Patient left: in chair;with call bell/phone within reach;with family/visitor present Nurse Communication: Mobility status PT Visit Diagnosis: Other abnormalities of gait and mobility (R26.89)    Time: 4580-9983 PT Time Calculation (min) (ACUTE ONLY): 20 min   Charges:   PT Evaluation $PT Eval Low Complexity: 1 Low         Kati PT, DPT Acute Rehabilitation Services Pager: [phone] Office: [phone]  LEMYRE,KATHrine E 06/17/2020, 11:45 AM

## 2020-06-17 NOTE — Progress Notes (Signed)
Patient discharged via wheelchair accompanied by staff. Pt. Is alert and oriented, vital sign stable. Discharge instruction and education discussed with both husband and wife. All patient belongings are with the family.

## 2020-06-17 NOTE — Discharge Summary (Signed)
Patient ID: [name] MRN: [id number] DOB/AGE: 13-Feb-1931 87 y.o.  Admit date: 06/16/2020 Discharge date: 06/17/2020  Admission Diagnoses:  Principal Problem:   Recurrent dislocation of patella, right knee   Discharge Diagnoses:  Same  Past Medical History:  Diagnosis Date  . Acute combined systolic and diastolic heart failure (HCC) 05/02/2017  . Anemia   . Anxiety   . Arthritis    "knees" (06/18/2015)  . Atrial fibrillation with RVR (HCC) 06/18/2015   h/o sig bleed on coumadin  . Cardiogenic shock (HCC) 05/02/2017  . CHF (congestive heart failure) (HCC)   . Complication of anesthesia 2010   "w/cardiac cath; got into a psychotic state for ~ 3 days; got me out w/Valium"  . Depression    "I have it off and on; not as often as I've gotten older" (06/18/2015)  . Diverticulosis    descending and sigmoid colon--Dr. Jarold Motto  . DJD (degenerative joint disease) of knee    left  . Dyspnea    with exertion and in morning mostly when wakes up   . Dysrhythmia   . E coli infection    History of  . GERD (gastroesophageal reflux disease)   . History of Clostridium difficile colitis   . IBS (irritable bowel syndrome)   . Insomnia     off chronic ambien 5mg  as of 10/11, rare/episodic use since then  DCM/CHF  . LBBB (left bundle branch block)   . Lymphocytic colitis 09/27/2016  . Moderate to severe pulmonary hypertension (HCC) 11/26/2019  . Nonischemic cardiomyopathy (HCC)    normal coronaries, EF 15-20% 04/2008, EF 50-55% 2015  . Osteoporosis    on DXA 05/2010  . Positive PPD   . Retroperitoneal bleed   . Sciatica    Right    Surgeries: Procedure(s): RIGHT RETINACULAR REPAIR on 06/16/2020   Consultants:   Discharged Condition: Improved  Hospital Course: [name] is an 87 y.o. female who was admitted 06/16/2020 for operative treatment ofRecurrent dislocation of patella, right knee. Patient has severe unremitting pain that affects sleep, daily activities, and work/hobbies.  After pre-op clearance the patient was taken to the operating room on 06/16/2020 and underwent  Procedure(s): RIGHT RETINACULAR REPAIR.    Patient was given perioperative antibiotics:  Anti-infectives (From admission, onward)   Start     Dose/Rate Route Frequency Ordered Stop   06/16/20 1930  ceFAZolin (ANCEF) IVPB 2g/100 mL premix        2 g 200 mL/hr over 30 Minutes Intravenous Every 6 hours 06/16/20 1633 06/17/20 1329   06/16/20 0945  ceFAZolin (ANCEF) IVPB 2g/100 mL premix        2 g 200 mL/hr over 30 Minutes Intravenous On call to O.R. 06/16/20 0936 06/16/20 1316   06/16/20 0942  ceFAZolin (ANCEF) 2-4 GM/100ML-% IVPB       Note to Pharmacy: 08/16/20   : cabinet override      06/16/20 0942 06/16/20 1319       Patient was given sequential compression devices, early ambulation, and chemoprophylaxis to prevent DVT.  Patient benefited maximally from hospital stay and there were no complications.    Recent vital signs:  Patient Vitals for the past 24 hrs:  BP Temp Temp src Pulse Resp SpO2 Height Weight  06/17/20 0520 128/73 98.2 F (36.8 C) Oral 70 16 96 % - -  06/17/20 0123 108/90 98.3 F (36.8 C) Oral (!) 59 16 100 % - -  06/16/20 2123 128/72 98.6 F (37 C) Oral  70 16 98 % - -  06/16/20 1919 127/81 (!) 97.4 F (36.3 C) Axillary 69 16 97 % - -  06/16/20 1811 127/77 - - 75 18 100 % - -  06/16/20 1719 (!) 146/62 (!) 97.5 F (36.4 C) Oral 75 - 100 % - -  06/16/20 1646 138/86 98 F (36.7 C) Oral 90 - 100 % - -  06/16/20 1608 140/90 98 F (36.7 C) Oral 75 - 100 % - -  06/16/20 1545 140/85 - - 80 20 99 % - -  06/16/20 1530 (!) 145/85 (!) 97.5 F (36.4 C) - 70 (!) 22 100 % - -  06/16/20 1515 140/87 - - 70 18 98 % - -  06/16/20 1500 119/72 - - 68 16 100 % - -  06/16/20 1445 121/78 - - (!) 59 18 100 % - -  06/16/20 1430 126/73 - - 64 18 100 % - -  06/16/20 1428 134/78 (!) 97.5 F (36.4 C) - 68 19 100 % - -  06/16/20 1210 - - - 75 15 100 % - -  06/16/20 1205 - - - 83 13  100 % - -  06/16/20 1200 136/88 - - (!) 35 11 (!) 78 % - -  06/16/20 1008 - - - - - - 5\' 5"  (1.651 m) 71.7 kg  06/16/20 1000 138/84 97.8 F (36.6 C) Oral 66 16 94 % 5\' 5"  (1.651 m) 71.7 kg     Recent laboratory studies: No results for input(s): WBC, HGB, HCT, PLT, NA, K, CL, CO2, BUN, CREATININE, GLUCOSE, INR, CALCIUM in the last 72 hours.  Invalid input(s): PT, 2   Discharge Medications:   Allergies as of 06/17/2020      Reactions   Ace Inhibitors Cough   Warfarin Sodium Other (See Comments)   DOSE RELATED PHARMACOLOGIC EFFECT "bleed out"   Famotidine Other (See Comments)   GI upset   Codeine Other (See Comments)   sedation   Delsym [dextromethorphan Polistirex Er] Other (See Comments)   dizziness   Lactose Intolerance (gi) Diarrhea   Phenylephrine Palpitations, Other (See Comments)   Nasal spray- "likely increase in nasal congestion".    Sulfamethoxazole-trimethoprim Nausea And Vomiting   GI intolerance.      Medication List    TAKE these medications   acetaminophen 500 MG tablet Commonly known as: TYLENOL Take 1,000 mg by mouth 2 (two) times daily.   BIOTIN PO Take 1 tablet by mouth daily.   buPROPion 100 MG 12 hr tablet Commonly known as: WELLBUTRIN SR TAKE 1 TABLET (100 MG TOTAL) BY MOUTH IN THE MORNING.   calcium carbonate 500 MG chewable tablet Commonly known as: TUMS - dosed in mg elemental calcium Chew 500 mg by mouth 2 (two) times daily as needed for indigestion or heartburn.   cholecalciferol 25 MCG (1000 UNIT) tablet Commonly known as: VITAMIN D3 Take 1,000 Units by mouth daily.   digoxin 0.125 MG tablet Commonly known as: LANOXIN TAKE 1 TABLET BY MOUTH DAILY   Eliquis 2.5 MG Tabs tablet Generic drug: apixaban TAKE 1 TABLET BY MOUTH TWICE A DAY What changed: how much to take   Klor-Con M20 20 MEQ tablet Generic drug: potassium chloride SA TAKE 1 TABLET BY MOUTH TWICE A DAY What changed:   how much to take  when to take this    loperamide 2 MG capsule Commonly known as: IMODIUM Take 2 mg by mouth 3 (three) times daily as needed for diarrhea or loose  stools.   losartan 25 MG tablet Commonly known as: COZAAR Take 1 tablet (25 mg total) by mouth daily.   melatonin 5 MG Tabs Take 2.5 mg by mouth at bedtime as needed (sleep).   metoprolol succinate 100 MG 24 hr tablet Commonly known as: TOPROL-XL Take 1 tablet (100 mg total) by mouth daily. Take with or immediately following a meal.   mirabegron ER 25 MG Tb24 tablet Commonly known as: Myrbetriq Take 1 tablet (25 mg total) by mouth daily.   Myrbetriq 50 MG Tb24 tablet Generic drug: mirabegron ER Take 50 mg by mouth daily.   ondansetron 4 MG tablet Commonly known as: Zofran Take 1 tablet (4 mg total) by mouth every 8 (eight) hours as needed for nausea or vomiting.   phenylephrine 1 % nasal spray Commonly known as: NEO-SYNEPHRINE Place 1 drop into both nostrils every 6 (six) hours as needed for congestion.   predniSONE 5 MG tablet Commonly known as: DELTASONE Take 2 tablets (10 mg total) by mouth daily with breakfast. What changed:   how much to take  when to take this   SALONPAS PAIN RELIEF PATCH EX Apply 2 patches topically daily as needed (pain).   sertraline 50 MG tablet Commonly known as: ZOLOFT Take 2 tablets (100 mg total) by mouth daily.   SYSTANE OP Place 1-2 drops into the left eye 3 (three) times daily as needed (dryness).   Systane 0.4-0.3 % Gel ophthalmic gel Generic drug: Polyethyl Glycol-Propyl Glycol Place 1 application into both eyes at bedtime.   tizanidine 2 MG capsule Commonly known as: ZANAFLEX Take 1 capsule (2 mg total) by mouth every 6 (six) hours as needed for muscle spasms.   traMADol 50 MG tablet Commonly known as: ULTRAM Take 1 tablet (50 mg total) by mouth every 6 (six) hours as needed for moderate pain or severe pain (post op pain). What changed: how much to take   trolamine salicylate 10 %  cream Commonly known as: ASPERCREME Apply 1 application topically at bedtime.       Diagnostic Studies: DG Chest 2 View  Result Date: 06/10/2020 CLINICAL DATA:  Preoperative respiratory exam for right patellar surgery. EXAM: CHEST - 2 VIEW COMPARISON:  08/06/2019 FINDINGS: The heart is mild-to-moderately enlarged. Diffuse pulmonary interstitial prominence present as well as a small left pleural effusion and potentially trace right pleural effusion. These findings may reflect some degree of chronic heart failure. No focal airspace consolidation, nodule or pneumothorax identified. Visualized bony structures are unremarkable. IMPRESSION: Cardiac enlargement, diffuse pulmonary interstitial prominence as well as small left pleural effusion and potentially trace right pleural effusion. These findings may reflect some degree of chronic heart failure. Electronically Signed   By: Irish Lack M.D.   On: 06/10/2020 09:59    Disposition: Discharge disposition: 01-Home or Self Care       Discharge Instructions    Call MD / Call 911   Complete by: As directed    If you experience chest pain or shortness of breath, CALL 911 and be transported to the hospital emergency room.  If you develope a fever above 101 F, pus (white drainage) or increased drainage or redness at the wound, or calf pain, call your surgeon's office.   Constipation Prevention   Complete by: As directed    Drink plenty of fluids.  Prune juice may be helpful.  You may use a stool softener, such as Colace (over the counter) 100 mg twice a day.  Use MiraLax (over the  counter) for constipation as needed.   Diet - low sodium heart healthy   Complete by: As directed    Discharge instructions   Complete by: As directed    Ice, elevate,  keep bandage clean and dry until follow up. MUST wear knee immobilizer whenever up and walking. Gentle Range of motion exercises only.   Increase activity slowly as tolerated   Complete by: As  directed        Follow-up Information    [name], MD. Schedule an appointment as soon as possible for a visit in 2 weeks.   Specialty: Orthopedic Surgery Contact information: [address] [place] [address] [phone]                Signed: Ginger Organ Nida 06/17/2020, 8:10 AM

## 2020-06-17 NOTE — Op Note (Signed)
[name], [facility] MED[facility] RECORD NO: [id number] ACCOUNT NO: [id number] DATE OF BIRTH: 29-Nov-1930 FACILITY: [facility]: [id number] PHYSICIAN: Lubertha Basque. Jerl Santos, MD  Operative Report   DATE OF PROCEDURE: 06/16/2020  PREOPERATIVE DIAGNOSIS:  Right knee patellar dislocation.  POSTOPERATIVE DIAGNOSIS:  Right knee patellar dislocation.  PROCEDURE:  Right knee retinaculum repair.  ANESTHESIA:  Spinal and sedation.  ATTENDING SURGEON:  Lubertha Basque. Jerl Santos, MD  ASSISTANT:  Elodia Florence, PA.  INDICATION FOR PROCEDURE:  The patient is an 87 year old woman with history of a knee replacement on the right, done about 3-1/2 months back.  She fell a couple of times during the rehabilitation process and dislocated her patella.  We were hoping to  manage this in a nonoperative fashion, but she developed recurrent patellar dislocations.  At this point, she is offered operative repair.  Informed operative consent was obtained after discussion of possible complications including reaction to  anesthesia, infection, DVT, and recurrent dislocations.  SUMMARY OF FINDINGS AND PROCEDURE:  Under spinal anesthesia and sedation through her old incision, we exposed her dislocated patella.  The extensor mechanism was separated significantly.  I freed this up and then repaired with Ethibond in interrupted  fashion.  I also added a lateral release to take some tension off the repair.  Once this was done, she could easily flex to 90 with good stability.  She was closed and scheduled to stay overnight.  Elodia Florence assisted throughout and was invaluable to  the completion of the case, mostly in that he maintained exposure while I performed the procedure.  He also closed simultaneously to help minimize OR time.  DESCRIPTION OF PROCEDURE:  The patient was taken to the operating suite where spinal anesthetic was applied along with some of the sedation.  She was positioned supine and prepped and draped in normal sterile  fashion.  After the administration of  preoperative IV Kefzol and appropriate timeout, the right leg was elevated, exsanguinated and a tourniquet inflated about her thigh.  We used a good portion of her old incision with dissection down to the extensor mechanism.  This was widely opened along  the old repair site and the patella was stuck in a lateral position.  Once we freed this up, the patella came over into appropriate position, and with flexion, the patella actually stayed in place, but could easily dislocate with mild pressure.  We used  pulsatile lavage and thoroughly irrigated the knee with several liters of sterile solution.  I then repaired the extensor mechanism retinaculum with #1 Ethibond in interrupted fashion.  I did add a lateral release to take some tension off this repair.   This was done with the Bovie.  Once the repair was completed, I could easily flex past 90 degrees and the patella tracked in appropriate position with no severe tension on any of the tissues.  The tourniquet was deflated and a small amount of bleeding  was easily controlled with Bovie cautery.  We then irrigated again and reapproximated deep tissues with 0 Vicryl and 2-0 undyed Vicryl followed by skin closure.  A sterile dressing was applied.  Estimated blood loss, intraoperative fluids can be obtained  from anesthesia records as can accurate tourniquet time.  DISPOSITION:  The patient was taken to recovery room in stable condition.  She was admitted for overnight observation with probable discharge home in the morning.   PAA D: 06/16/2020 2:23:42 pm T: 06/17/2020 1:39:00 am  JOB: [id number]/ [id number]

## 2020-06-17 NOTE — Progress Notes (Signed)
Subjective: 1 Day Post-Op Procedure(s) (LRB): RIGHT RETINACULAR REPAIR (Right)   Patient is feeling well today. She is eating and resting comfortably in bed.  Activity level:  wbat with knee immobilizer on. Diet tolerance:  ok Voiding:  ok Patient reports pain as mild.    Objective: Vital signs in last 24 hours: Temp:  [97.4 F (36.3 C)-98.6 F (37 C)] 98.2 F (36.8 C) (03/09 0520) Pulse Rate:  [35-90] 70 (03/09 0520) Resp:  [11-22] 16 (03/09 0520) BP: (108-146)/(62-90) 128/73 (03/09 0520) SpO2:  [78 %-100 %] 96 % (03/09 0520) Weight:  [71.7 kg] 71.7 kg (03/08 1008)  Labs: No results for input(s): HGB in the last 72 hours. No results for input(s): WBC, RBC, HCT, PLT in the last 72 hours. No results for input(s): NA, K, CL, CO2, BUN, CREATININE, GLUCOSE, CALCIUM in the last 72 hours. No results for input(s): LABPT, INR in the last 72 hours.  Physical Exam:  Neurologically intact ABD soft Neurovascular intact Sensation intact distally Intact pulses distally Dorsiflexion/Plantar flexion intact Incision: dressing C/D/I and no drainage No cellulitis present Compartment soft  Assessment/Plan:  1 Day Post-Op Procedure(s) (LRB): RIGHT RETINACULAR REPAIR (Right) Advance diet Up with therapy Discharge home with home health today after PT if cleared and doing well. Follow up in office 2 weeks post op. Continue on home eliquis.   [name] [name] 06/17/2020, 8:07 AM

## 2020-06-18 DIAGNOSIS — D649 Anemia, unspecified: Secondary | ICD-10-CM | POA: Diagnosis not present

## 2020-06-18 DIAGNOSIS — G4700 Insomnia, unspecified: Secondary | ICD-10-CM | POA: Diagnosis not present

## 2020-06-18 DIAGNOSIS — Z7952 Long term (current) use of systemic steroids: Secondary | ICD-10-CM | POA: Diagnosis not present

## 2020-06-18 DIAGNOSIS — I272 Pulmonary hypertension, unspecified: Secondary | ICD-10-CM | POA: Diagnosis not present

## 2020-06-18 DIAGNOSIS — Z96653 Presence of artificial knee joint, bilateral: Secondary | ICD-10-CM | POA: Diagnosis not present

## 2020-06-18 DIAGNOSIS — K5790 Diverticulosis of intestine, part unspecified, without perforation or abscess without bleeding: Secondary | ICD-10-CM | POA: Diagnosis not present

## 2020-06-18 DIAGNOSIS — K580 Irritable bowel syndrome with diarrhea: Secondary | ICD-10-CM | POA: Diagnosis not present

## 2020-06-18 DIAGNOSIS — I4891 Unspecified atrial fibrillation: Secondary | ICD-10-CM | POA: Diagnosis not present

## 2020-06-18 DIAGNOSIS — M5431 Sciatica, right side: Secondary | ICD-10-CM | POA: Diagnosis not present

## 2020-06-18 DIAGNOSIS — I252 Old myocardial infarction: Secondary | ICD-10-CM | POA: Diagnosis not present

## 2020-06-18 DIAGNOSIS — I071 Rheumatic tricuspid insufficiency: Secondary | ICD-10-CM | POA: Diagnosis not present

## 2020-06-18 DIAGNOSIS — Z7901 Long term (current) use of anticoagulants: Secondary | ICD-10-CM | POA: Diagnosis not present

## 2020-06-18 DIAGNOSIS — I5043 Acute on chronic combined systolic (congestive) and diastolic (congestive) heart failure: Secondary | ICD-10-CM | POA: Diagnosis not present

## 2020-06-18 DIAGNOSIS — K219 Gastro-esophageal reflux disease without esophagitis: Secondary | ICD-10-CM | POA: Diagnosis not present

## 2020-06-18 DIAGNOSIS — Z96642 Presence of left artificial hip joint: Secondary | ICD-10-CM | POA: Diagnosis not present

## 2020-06-18 DIAGNOSIS — S83011D Lateral subluxation of right patella, subsequent encounter: Secondary | ICD-10-CM | POA: Diagnosis not present

## 2020-06-18 DIAGNOSIS — M810 Age-related osteoporosis without current pathological fracture: Secondary | ICD-10-CM | POA: Diagnosis not present

## 2020-06-18 DIAGNOSIS — I447 Left bundle-branch block, unspecified: Secondary | ICD-10-CM | POA: Diagnosis not present

## 2020-06-18 DIAGNOSIS — I428 Other cardiomyopathies: Secondary | ICD-10-CM | POA: Diagnosis not present

## 2020-06-18 DIAGNOSIS — I110 Hypertensive heart disease with heart failure: Secondary | ICD-10-CM | POA: Diagnosis not present

## 2020-06-18 DIAGNOSIS — F32A Depression, unspecified: Secondary | ICD-10-CM | POA: Diagnosis not present

## 2020-06-20 DIAGNOSIS — I252 Old myocardial infarction: Secondary | ICD-10-CM | POA: Diagnosis not present

## 2020-06-20 DIAGNOSIS — F32A Depression, unspecified: Secondary | ICD-10-CM | POA: Diagnosis not present

## 2020-06-20 DIAGNOSIS — Z7901 Long term (current) use of anticoagulants: Secondary | ICD-10-CM | POA: Diagnosis not present

## 2020-06-20 DIAGNOSIS — K219 Gastro-esophageal reflux disease without esophagitis: Secondary | ICD-10-CM | POA: Diagnosis not present

## 2020-06-20 DIAGNOSIS — M5431 Sciatica, right side: Secondary | ICD-10-CM | POA: Diagnosis not present

## 2020-06-20 DIAGNOSIS — M810 Age-related osteoporosis without current pathological fracture: Secondary | ICD-10-CM | POA: Diagnosis not present

## 2020-06-20 DIAGNOSIS — I071 Rheumatic tricuspid insufficiency: Secondary | ICD-10-CM | POA: Diagnosis not present

## 2020-06-20 DIAGNOSIS — D649 Anemia, unspecified: Secondary | ICD-10-CM | POA: Diagnosis not present

## 2020-06-20 DIAGNOSIS — Z96653 Presence of artificial knee joint, bilateral: Secondary | ICD-10-CM | POA: Diagnosis not present

## 2020-06-20 DIAGNOSIS — I110 Hypertensive heart disease with heart failure: Secondary | ICD-10-CM | POA: Diagnosis not present

## 2020-06-20 DIAGNOSIS — S83011D Lateral subluxation of right patella, subsequent encounter: Secondary | ICD-10-CM | POA: Diagnosis not present

## 2020-06-20 DIAGNOSIS — I428 Other cardiomyopathies: Secondary | ICD-10-CM | POA: Diagnosis not present

## 2020-06-20 DIAGNOSIS — I4891 Unspecified atrial fibrillation: Secondary | ICD-10-CM | POA: Diagnosis not present

## 2020-06-20 DIAGNOSIS — G4700 Insomnia, unspecified: Secondary | ICD-10-CM | POA: Diagnosis not present

## 2020-06-20 DIAGNOSIS — K580 Irritable bowel syndrome with diarrhea: Secondary | ICD-10-CM | POA: Diagnosis not present

## 2020-06-20 DIAGNOSIS — Z7952 Long term (current) use of systemic steroids: Secondary | ICD-10-CM | POA: Diagnosis not present

## 2020-06-20 DIAGNOSIS — Z96642 Presence of left artificial hip joint: Secondary | ICD-10-CM | POA: Diagnosis not present

## 2020-06-20 DIAGNOSIS — I5043 Acute on chronic combined systolic (congestive) and diastolic (congestive) heart failure: Secondary | ICD-10-CM | POA: Diagnosis not present

## 2020-06-20 DIAGNOSIS — I447 Left bundle-branch block, unspecified: Secondary | ICD-10-CM | POA: Diagnosis not present

## 2020-06-20 DIAGNOSIS — I272 Pulmonary hypertension, unspecified: Secondary | ICD-10-CM | POA: Diagnosis not present

## 2020-06-20 DIAGNOSIS — K5790 Diverticulosis of intestine, part unspecified, without perforation or abscess without bleeding: Secondary | ICD-10-CM | POA: Diagnosis not present

## 2020-06-23 ENCOUNTER — Emergency Department (HOSPITAL_COMMUNITY): Payer: Medicare Other

## 2020-06-23 ENCOUNTER — Inpatient Hospital Stay (HOSPITAL_COMMUNITY)
Admission: EM | Admit: 2020-06-23 | Discharge: 2020-06-25 | DRG: 291 | Disposition: A | Discharge status: Home / Self Care | Payer: Medicare Other | Attending: Cardiology | Admitting: Cardiology

## 2020-06-23 ENCOUNTER — Encounter: Payer: Self-pay | Admitting: Family Medicine

## 2020-06-23 ENCOUNTER — Other Ambulatory Visit: Payer: Self-pay

## 2020-06-23 ENCOUNTER — Encounter (HOSPITAL_COMMUNITY): Payer: Self-pay | Admitting: Emergency Medicine

## 2020-06-23 ENCOUNTER — Telehealth: Payer: Self-pay

## 2020-06-23 ENCOUNTER — Emergency Department (HOSPITAL_BASED_OUTPATIENT_CLINIC_OR_DEPARTMENT_OTHER): Payer: Medicare Other

## 2020-06-23 DIAGNOSIS — K219 Gastro-esophageal reflux disease without esophagitis: Secondary | ICD-10-CM | POA: Diagnosis not present

## 2020-06-23 DIAGNOSIS — R600 Localized edema: Secondary | ICD-10-CM

## 2020-06-23 DIAGNOSIS — I5041 Acute combined systolic (congestive) and diastolic (congestive) heart failure: Secondary | ICD-10-CM | POA: Diagnosis not present

## 2020-06-23 DIAGNOSIS — Z7901 Long term (current) use of anticoagulants: Secondary | ICD-10-CM | POA: Diagnosis not present

## 2020-06-23 DIAGNOSIS — R635 Abnormal weight gain: Secondary | ICD-10-CM | POA: Diagnosis present

## 2020-06-23 DIAGNOSIS — I4821 Permanent atrial fibrillation: Secondary | ICD-10-CM | POA: Diagnosis present

## 2020-06-23 DIAGNOSIS — M7989 Other specified soft tissue disorders: Secondary | ICD-10-CM

## 2020-06-23 DIAGNOSIS — Z96642 Presence of left artificial hip joint: Secondary | ICD-10-CM | POA: Diagnosis present

## 2020-06-23 DIAGNOSIS — Z20822 Contact with and (suspected) exposure to covid-19: Secondary | ICD-10-CM | POA: Diagnosis present

## 2020-06-23 DIAGNOSIS — Z96653 Presence of artificial knee joint, bilateral: Secondary | ICD-10-CM | POA: Diagnosis not present

## 2020-06-23 DIAGNOSIS — I110 Hypertensive heart disease with heart failure: Secondary | ICD-10-CM | POA: Diagnosis not present

## 2020-06-23 DIAGNOSIS — E876 Hypokalemia: Secondary | ICD-10-CM | POA: Diagnosis present

## 2020-06-23 DIAGNOSIS — Z8619 Personal history of other infectious and parasitic diseases: Secondary | ICD-10-CM

## 2020-06-23 DIAGNOSIS — Z79899 Other long term (current) drug therapy: Secondary | ICD-10-CM

## 2020-06-23 DIAGNOSIS — I428 Other cardiomyopathies: Secondary | ICD-10-CM | POA: Diagnosis not present

## 2020-06-23 DIAGNOSIS — R0602 Shortness of breath: Secondary | ICD-10-CM | POA: Diagnosis not present

## 2020-06-23 DIAGNOSIS — Z885 Allergy status to narcotic agent status: Secondary | ICD-10-CM | POA: Diagnosis not present

## 2020-06-23 DIAGNOSIS — J439 Emphysema, unspecified: Secondary | ICD-10-CM | POA: Diagnosis not present

## 2020-06-23 DIAGNOSIS — I4891 Unspecified atrial fibrillation: Secondary | ICD-10-CM | POA: Diagnosis present

## 2020-06-23 DIAGNOSIS — Z888 Allergy status to other drugs, medicaments and biological substances status: Secondary | ICD-10-CM

## 2020-06-23 DIAGNOSIS — Z882 Allergy status to sulfonamides status: Secondary | ICD-10-CM | POA: Diagnosis not present

## 2020-06-23 DIAGNOSIS — J90 Pleural effusion, not elsewhere classified: Secondary | ICD-10-CM | POA: Diagnosis not present

## 2020-06-23 DIAGNOSIS — I509 Heart failure, unspecified: Secondary | ICD-10-CM | POA: Diagnosis not present

## 2020-06-23 DIAGNOSIS — K589 Irritable bowel syndrome without diarrhea: Secondary | ICD-10-CM | POA: Diagnosis not present

## 2020-06-23 DIAGNOSIS — I255 Ischemic cardiomyopathy: Secondary | ICD-10-CM | POA: Diagnosis present

## 2020-06-23 DIAGNOSIS — F419 Anxiety disorder, unspecified: Secondary | ICD-10-CM | POA: Diagnosis present

## 2020-06-23 DIAGNOSIS — Z7952 Long term (current) use of systemic steroids: Secondary | ICD-10-CM | POA: Diagnosis not present

## 2020-06-23 DIAGNOSIS — J810 Acute pulmonary edema: Secondary | ICD-10-CM

## 2020-06-23 DIAGNOSIS — Z8679 Personal history of other diseases of the circulatory system: Secondary | ICD-10-CM

## 2020-06-23 DIAGNOSIS — G4700 Insomnia, unspecified: Secondary | ICD-10-CM | POA: Diagnosis not present

## 2020-06-23 DIAGNOSIS — I4811 Longstanding persistent atrial fibrillation: Secondary | ICD-10-CM | POA: Diagnosis not present

## 2020-06-23 DIAGNOSIS — I5043 Acute on chronic combined systolic (congestive) and diastolic (congestive) heart failure: Secondary | ICD-10-CM | POA: Diagnosis present

## 2020-06-23 DIAGNOSIS — I447 Left bundle-branch block, unspecified: Secondary | ICD-10-CM | POA: Diagnosis not present

## 2020-06-23 LAB — BASIC METABOLIC PANEL
Anion gap: 11 (ref 5–15)
BUN: 15 mg/dL (ref 8–23)
CO2: 26 mmol/L (ref 22–32)
Calcium: 9 mg/dL (ref 8.9–10.3)
Chloride: 96 mmol/L — ABNORMAL LOW (ref 98–111)
Creatinine, Ser: 0.82 mg/dL (ref 0.44–1.00)
GFR, Estimated: >60 mL/min (ref >60)
Glucose, Bld: 120 mg/dL — ABNORMAL HIGH (ref 70–99)
Potassium: 4.1 mmol/L (ref 3.5–5.1)
Sodium: 133 mmol/L — ABNORMAL LOW (ref 135–145)

## 2020-06-23 LAB — CBC
HCT: 42.7 % (ref 36.0–46.0)
Hemoglobin: 14 g/dL (ref 12.0–15.0)
MCH: 30.5 pg (ref 26.0–34.0)
MCHC: 32.8 g/dL (ref 30.0–36.0)
MCV: 93 fL (ref 80.0–100.0)
Platelets: 322 10*3/uL (ref 150–400)
RBC: 4.59 MIL/uL (ref 3.87–5.11)
RDW: 15.6 % — ABNORMAL HIGH (ref 11.5–15.5)
WBC: 8.9 10*3/uL (ref 4.0–10.5)
nRBC: 0 % (ref 0.0–0.2)

## 2020-06-23 LAB — RESP PANEL BY RT-PCR (FLU A&B, COVID) ARPGX2
Influenza A by PCR: NEGATIVE
Influenza B by PCR: NEGATIVE
SARS Coronavirus 2 by RT PCR: NEGATIVE

## 2020-06-23 LAB — TROPONIN I (HIGH SENSITIVITY): Troponin I (High Sensitivity): 12 ng/L (ref <18)

## 2020-06-23 LAB — BRAIN NATRIURETIC PEPTIDE: B Natriuretic Peptide: 3972.5 pg/mL — ABNORMAL HIGH (ref 0.0–100.0)

## 2020-06-23 MED ORDER — LOSARTAN POTASSIUM 25 MG PO TABS
25.0000 mg | ORAL_TABLET | Freq: Every day | ORAL | Status: DC
  Administered 2020-06-24 – 2020-06-25 (×2): 25 mg via ORAL
  Filled 2020-06-23 (×2): qty 1

## 2020-06-23 MED ORDER — ZOLPIDEM TARTRATE 5 MG PO TABS: 5.0000 mg | ORAL_TABLET | Freq: Every evening | ORAL | Status: DC | PRN

## 2020-06-23 MED ORDER — LOPERAMIDE HCL 2 MG PO CAPS: 2.0000 mg | ORAL_CAPSULE | Freq: Three times a day (TID) | ORAL | Status: DC | PRN

## 2020-06-23 MED ORDER — PREDNISONE 5 MG PO TABS
5.0000 mg | ORAL_TABLET | Freq: Every day | ORAL | Status: DC
  Administered 2020-06-24 – 2020-06-25 (×2): 5 mg via ORAL
  Filled 2020-06-23 (×3): qty 1

## 2020-06-23 MED ORDER — MELATONIN 5 MG PO TABS
2.5000 mg | ORAL_TABLET | Freq: Every evening | ORAL | Status: DC | PRN
  Filled 2020-06-23: qty 0.5

## 2020-06-23 MED ORDER — SERTRALINE HCL 100 MG PO TABS
100.0000 mg | ORAL_TABLET | Freq: Every day | ORAL | Status: DC
  Administered 2020-06-24 – 2020-06-25 (×2): 100 mg via ORAL
  Filled 2020-06-23 (×2): qty 1

## 2020-06-23 MED ORDER — ALPRAZOLAM 0.25 MG PO TABS: 0.2500 mg | ORAL_TABLET | Freq: Two times a day (BID) | ORAL | Status: DC | PRN

## 2020-06-23 MED ORDER — POTASSIUM CHLORIDE CRYS ER 20 MEQ PO TBCR
20.0000 meq | EXTENDED_RELEASE_TABLET | Freq: Every day | ORAL | Status: DC
  Administered 2020-06-23 – 2020-06-24 (×2): 20 meq via ORAL
  Filled 2020-06-23 (×3): qty 1

## 2020-06-23 MED ORDER — ONDANSETRON HCL 4 MG/2ML IJ SOLN: 4.0000 mg | Freq: Four times a day (QID) | INTRAMUSCULAR | Status: DC | PRN

## 2020-06-23 MED ORDER — SODIUM CHLORIDE 0.9% FLUSH
3.0000 mL | Freq: Two times a day (BID) | INTRAVENOUS | Status: DC
  Administered 2020-06-24 – 2020-06-25 (×3): 3 mL via INTRAVENOUS

## 2020-06-23 MED ORDER — DIGOXIN 125 MCG PO TABS: 0.1250 mg | ORAL_TABLET | Freq: Every day | ORAL | Status: DC

## 2020-06-23 MED ORDER — SODIUM CHLORIDE 0.9 % IV SOLN: 250.0000 mL | INTRAVENOUS | Status: DC | PRN

## 2020-06-23 MED ORDER — FUROSEMIDE 10 MG/ML IJ SOLN
40.0000 mg | Freq: Two times a day (BID) | INTRAMUSCULAR | Status: DC
  Administered 2020-06-23 – 2020-06-24 (×3): 40 mg via INTRAVENOUS
  Filled 2020-06-23 (×3): qty 4

## 2020-06-23 MED ORDER — METOPROLOL SUCCINATE ER 100 MG PO TB24: 100.0000 mg | ORAL_TABLET | Freq: Every day | ORAL | Status: DC

## 2020-06-23 MED ORDER — ACETAMINOPHEN 325 MG PO TABS
650.0000 mg | ORAL_TABLET | ORAL | Status: DC | PRN
  Administered 2020-06-24: 650 mg via ORAL
  Filled 2020-06-23 (×2): qty 2

## 2020-06-23 MED ORDER — LOSARTAN POTASSIUM 50 MG PO TABS: 25.0000 mg | ORAL_TABLET | Freq: Every day | ORAL | Status: DC

## 2020-06-23 MED ORDER — POLYETHYL GLYCOL-PROPYL GLYCOL 0.4-0.3 % OP GEL: 1.0000 "application " | Freq: Every day | OPHTHALMIC | Status: DC

## 2020-06-23 MED ORDER — SODIUM CHLORIDE 0.9% FLUSH: 3.0000 mL | INTRAVENOUS | Status: DC | PRN

## 2020-06-23 MED ORDER — DIGOXIN 125 MCG PO TABS
0.1250 mg | ORAL_TABLET | Freq: Every day | ORAL | Status: DC
  Administered 2020-06-24: 0.125 mg via ORAL
  Filled 2020-06-23 (×2): qty 1

## 2020-06-23 MED ORDER — VITAMIN D 25 MCG (1000 UNIT) PO TABS: 1000.0000 [IU] | ORAL_TABLET | Freq: Every day | ORAL | Status: DC

## 2020-06-23 MED ORDER — TRAMADOL HCL 50 MG PO TABS: 50.0000 mg | ORAL_TABLET | Freq: Four times a day (QID) | ORAL | Status: DC | PRN

## 2020-06-23 MED ORDER — SERTRALINE HCL 100 MG PO TABS: 100.0000 mg | ORAL_TABLET | Freq: Every day | ORAL | Status: DC

## 2020-06-23 MED ORDER — APIXABAN 2.5 MG PO TABS
2.5000 mg | ORAL_TABLET | Freq: Two times a day (BID) | ORAL | Status: DC
  Administered 2020-06-23 – 2020-06-25 (×4): 2.5 mg via ORAL
  Filled 2020-06-23 (×5): qty 1

## 2020-06-23 MED ORDER — BUPROPION HCL ER (SR) 100 MG PO TB12
100.0000 mg | ORAL_TABLET | Freq: Every morning | ORAL | Status: DC
  Filled 2020-06-23 (×3): qty 1

## 2020-06-23 MED ORDER — FUROSEMIDE 10 MG/ML IJ SOLN
40.0000 mg | Freq: Once | INTRAMUSCULAR | Status: AC
  Administered 2020-06-23: 40 mg via INTRAVENOUS
  Filled 2020-06-23: qty 4

## 2020-06-23 MED ORDER — TROLAMINE SALICYLATE 10 % EX CREA: 1.0000 "application " | TOPICAL_CREAM | Freq: Every day | CUTANEOUS | Status: DC

## 2020-06-23 MED ORDER — METOPROLOL SUCCINATE ER 100 MG PO TB24
100.0000 mg | ORAL_TABLET | Freq: Every day | ORAL | Status: DC
  Administered 2020-06-24 – 2020-06-25 (×2): 100 mg via ORAL
  Filled 2020-06-23 (×2): qty 1

## 2020-06-23 MED ORDER — NITROGLYCERIN 0.4 MG SL SUBL: 0.4000 mg | SUBLINGUAL_TABLET | SUBLINGUAL | Status: DC | PRN

## 2020-06-23 MED ORDER — VITAMIN D 25 MCG (1000 UNIT) PO TABS
1000.0000 [IU] | ORAL_TABLET | Freq: Every day | ORAL | Status: DC
  Administered 2020-06-24 – 2020-06-25 (×2): 1000 [IU] via ORAL
  Filled 2020-06-23 (×2): qty 1

## 2020-06-23 NOTE — Telephone Encounter (Signed)
I spoke with pt; pt said starting about 1 wk ago, pt has started with some swelling in lt lower leg and foot. No redness or pain and pt taking Eliquis 2.5 mg bid. Pt has SOB for last wk that is worsening. When talking with pt on phone she would have to stop while talking to get her breath. Pt said she does not fell good and is sleeping more than usual. Pt cannot tell if abd is swollen due to wearing loose fitting clothes. Pt has lightheadedness all the time and feels unsteady on her feet. No CP or H/A. Pt does not want CHF; pt has hx of systolic and dystolic CHF. Pt said she took Furosemide 1 tab on 06/22/20 and 1 tab on 06/23/20 and now pt has diarrhea. Pt said had stopped Furosemide before due to diarrhea. Pt said on 02/08 weighed 148 and weighed today and wt was 164 lbs (16 lb wt gain). Dr Para March advised pt needs to be seen sooner than later and should go to ED and if refuses ED which pt did should go to UC for eval and testing. Pt said she emailed DR Duke Salvia also and [facility] nurse is coming today at 1 PM and will wait to see what they say. UC & ED precautions given again and pt voiced understanding. Sending note to DR Para March and Dr [name] cardiology.

## 2020-06-23 NOTE — Telephone Encounter (Signed)
See TE note to triage nurse to call and triage patient.

## 2020-06-23 NOTE — Telephone Encounter (Signed)
I  stopped the Furosemide several months ago because of diarrhea. You told me to watch for swelling and weight gain. To my surprise I've had both. Feb 8th I saw Dr, Duke Salvia before knee surgery and I weighed 148. This AM I weighed 164!  I'd not weighed much recently. I was shocked at the weigh gain. My left leg and foot are swollen.  I've been short of breath and feel bad. I've taken two of the fluid pills, and have diarrhea! Is there another diauretic I can take? I've not watched my diet either.  Let me know what you think?  I don't want congestive heart failure, but I've felt close to it.  [name]  Patient left [employer]. Can you call patient and triage to be sure nothing else is going on with her SOB?

## 2020-06-23 NOTE — ED Triage Notes (Addendum)
PT reports history of CHF and AFIB.  Had knee surgery 06/16/20.  Reports SOB x 2-3 weeks that is getting worse.  States prior to surgery she weighed 148 lbs and now weighs 164 lbs.  Denies pain.  States she started back taking diuretic yesterday and has diarrhea.  States she normally has diarrhea when she takes a diuretic.

## 2020-06-23 NOTE — Telephone Encounter (Signed)
Thank you.  Patient is in ED

## 2020-06-23 NOTE — ED Provider Notes (Signed)
[facility] Provider Note   CSN: [id number] Arrival date & time: 06/23/20  1235     History Chief Complaint  Patient presents with  . Shortness of Breath  . Diarrhea  . Atrial Fibrillation    [name] is a 87 y.o. female.  Patient with history of congestive heart failure followed by Dr. Eual Finesiffany Randall locally, history of atrial fibrillation on anticoagulant, right knee surgery last week presents with worsening weight gain, fatigue and shortness of breath gradually worsening for 2 weeks.        Past Medical History:  Diagnosis Date  . Acute combined systolic and diastolic heart failure (HCC) 05/02/2017  . Anemia   . Anxiety   . Arthritis    "knees" (06/18/2015)  . Atrial fibrillation with RVR (HCC) 06/18/2015   h/o sig bleed on coumadin  . Cardiogenic shock (HCC) 05/02/2017  . CHF (congestive heart failure) (HCC)   . Complication of anesthesia 2010   "w/cardiac cath; got into a psychotic state for ~ 3 days; got me out w/Valium"  . Depression    "I have it off and on; not as often as I've gotten older" (06/18/2015)  . Diverticulosis    descending and sigmoid colon--Dr. Jarold MottoPatterson  . DJD (degenerative joint disease) of knee    left  . Dyspnea    with exertion and in morning mostly when wakes up   . Dysrhythmia   . E coli infection    History of  . GERD (gastroesophageal reflux disease)   . History of Clostridium difficile colitis   . IBS (irritable bowel syndrome)   . Insomnia     off chronic ambien 5mg  as of 10/11, rare/episodic use since then  DCM/CHF  . LBBB (left bundle branch block)   . Lymphocytic colitis 09/27/2016  . Moderate to severe pulmonary hypertension (HCC) 11/26/2019  . Nonischemic cardiomyopathy (HCC)    normal coronaries, EF 15-20% 04/2008, EF 50-55% 2015  . Osteoporosis    on DXA 05/2010  . Positive PPD   . Retroperitoneal bleed   . Sciatica    Right    Patient Active Problem List   Diagnosis Date  Noted  . Recurrent dislocation of patella, right knee 06/16/2020  . Primary osteoarthritis of right knee 02/18/2020  . 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 Left knee pain 12/03/2019  . Moderate to severe pulmonary hypertension (HCC) 11/26/2019  . Tremor of both hands 09/29/2019  . Dysuria 08/06/2019  . Severe tricuspid regurgitation   . S/P hip hemiarthroplasty 08/04/2019  . Ear symptom 12/30/2018  . Chronic diarrhea 09/07/2017  . Hoarseness of voice 09/07/2017  . Total knee replacement status, left 04/25/17 05/06/2017  . Acute combined systolic and diastolic heart failure (HCC) 05/02/2017  . Primary osteoarthritis of left knee 04/25/2017  . DJD (degenerative joint disease) 04/25/2017  . Healthcare maintenance 02/08/2017  . Vitamin D deficiency 02/08/2017  . Microscopic colitis 09/27/2016  . IBS (irritable bowel syndrome) 08/31/2016  . At risk for falling 01/07/2016  . Chronic combined systolic and diastolic heart failure (HCC)   . Rectus sheath hematoma- no anticoagulation   . Atrial fibrillation with RVR- CHADs VASc=4 06/18/2015  . Diarrhea 06/12/2015  . Advance care planning 10/23/2014  . Leg edema 06/27/2012  . Medicare annual wellness visit, subsequent 03/27/2012  . Depression 03/05/2012  . EDEMA 06/07/2010  . Osteoporosis 05/23/2010  . KNEE PAIN, LEFT 03/02/2010  . LBBB (left bundle  branch block) 10/28/2008  . POST MI SEPTAL DEFECT 06/24/2008  . Cough 06/24/2008  . Nonischemic cardiomyopathy (HCC) 05/30/2008  . ATRIAL FIBRILLATION 05/30/2008  . HYPONATREMIA, HX OF 05/30/2008    Past Surgical History:  Procedure Laterality Date  . ANTERIOR APPROACH HEMI HIP ARTHROPLASTY Left 08/05/2019   Procedure: ANTERIOR APPROACH HEMI HIP ARTHROPLASTY;  Surgeon: Jodi Geralds, MD;  Location: [facility] OR;  Service: Orthopedics;  Laterality: Left;  . BIOPSY  11/22/2017   Procedure: BIOPSY;  Surgeon: Iva Boop, MD;  Location: [facility] ENDOSCOPY;  Service:  Endoscopy;;  . CARDIAC CATHETERIZATION  2010  . CARDIOVERSION N/A 06/19/2015   Procedure: CARDIOVERSION;  Surgeon: Laurey Morale, MD;  Location: [facility] ENDOSCOPY;  Service: Cardiovascular;  Laterality: N/A;  . CATARACT EXTRACTION W/ INTRAOCULAR LENS  IMPLANT, BILATERAL Bilateral   . COLONOSCOPY WITH PROPOFOL N/A 11/22/2017   Procedure: COLONOSCOPY WITH PROPOFOL;  Surgeon: Iva Boop, MD;  Location: [facility] ENDOSCOPY;  Service: Endoscopy;  Laterality: N/A;  . DILATION AND CURETTAGE OF UTERUS    . TEE WITHOUT CARDIOVERSION N/A 06/19/2015   Procedure: TRANSESOPHAGEAL ECHOCARDIOGRAM (TEE);  Surgeon: Laurey Morale, MD;  Location: [facility] ENDOSCOPY;  Service: Cardiovascular;  Laterality: N/A;  . TOTAL KNEE ARTHROPLASTY Left 04/25/2017   Procedure: TOTAL KNEE ARTHROPLASTY;  Surgeon: [name], MD;  Location: [facility] OR;  Service: Orthopedics;  Laterality: Left;  . TOTAL KNEE ARTHROPLASTY Right 02/18/2020   Procedure: RIGHT TOTAL KNEE ARTHROPLASTY;  Surgeon: [name], MD;  Location: [facility] ORS;  Service: Orthopedics;  Laterality: Right;  . TOTAL KNEE REVISION Left 12/03/2019   Procedure: LEFT TOTAL KNEE REVISION  OF POYLY EXCHANGE;  Surgeon: [name], MD;  Location: [facility] ORS;  Service: Orthopedics;  Laterality: Left;  [name] VAGINAL HYSTERECTOMY  1979   ovaries intact     OB History   No obstetric history on file.     Family History  Problem Relation Age of Onset  . Colon cancer Father        possible colon cancer  . Other Father        esophagus tumor?  . Tuberculosis Mother   . Breast cancer Neg Hx   . Stomach cancer Neg Hx   . Pancreatic cancer Neg Hx     Social History   Tobacco Use  . Smoking status: Never Smoker  . Smokeless tobacco: Never Used  Vaping Use  . Vaping Use: Never used  Substance Use Topics  . Alcohol use: No    Alcohol/week: 0.0 standard drinks  . Drug use: No    Home Medications Prior to Admission medications   Medication Sig Start Date End Date Taking?  Authorizing Provider  acetaminophen (TYLENOL) 500 MG tablet Take 1,000 mg by mouth 2 (two) times daily.    [provider]  BIOTIN PO Take 1 tablet by mouth daily.    [provider]  buPROPion (WELLBUTRIN SR) 100 MG 12 hr tablet TAKE 1 TABLET (100 MG TOTAL) BY MOUTH IN THE MORNING. 05/10/20   [name], MD  calcium carbonate (TUMS - DOSED IN MG ELEMENTAL CALCIUM) 500 MG chewable tablet Chew 500 mg by mouth 2 (two) times daily as needed for indigestion or heartburn.    [provider]  cholecalciferol (VITAMIN D3) 25 MCG (1000 UNIT) tablet Take 1,000 Units by mouth daily.    [provider]  digoxin (LANOXIN) 0.125 MG tablet TAKE 1 TABLET BY MOUTH DAILY Patient taking differently: Take 0.125 mg by mouth daily. 02/17/20  [name], MD  ELIQUIS 2.5 MG TABS tablet TAKE 1 TABLET BY MOUTH TWICE A DAY Patient taking differently: Take 2.5 mg by mouth 2 (two) times daily. 12/20/19   [name], MD  KLOR-CON M20 20 MEQ tablet TAKE 1 TABLET BY MOUTH TWICE A DAY Patient taking differently: Take 20 mEq by mouth daily. 05/18/20   [name], MD  loperamide (IMODIUM) 2 MG capsule Take 2 mg by mouth 3 (three) times daily as needed for diarrhea or loose stools.     [provider]  losartan (COZAAR) 25 MG tablet Take 1 tablet (25 mg total) by mouth daily. 05/20/20 08/18/20  [name], MD  melatonin 5 MG TABS Take 2.5 mg by mouth at bedtime as needed (sleep).    [provider]  Menthol-Methyl Salicylate (SALONPAS PAIN RELIEF PATCH EX) Apply 2 patches topically daily as needed (pain).    [provider]  metoprolol succinate (TOPROL-XL) 100 MG 24 hr tablet Take 1 tablet (100 mg total) by mouth daily. Take with or immediately following a meal. 04/16/20 04/11/21  [name], MD  mirabegron ER (MYRBETRIQ) 25 MG TB24 tablet Take 1 tablet (25 mg total) by mouth daily. Patient not taking: Reported on 05/28/2020 11/13/19   [name], MD  MYRBETRIQ 50 MG TB24 tablet Take 50 mg by mouth daily. 05/16/20   [provider]  ondansetron (ZOFRAN) 4 MG tablet Take 1 tablet (4 mg total) by mouth every 8 (eight) hours as needed for nausea or vomiting. 06/16/20   Eustaquio Boyden, MD  phenylephrine (NEO-SYNEPHRINE) 1 % nasal spray Place 1 drop into both nostrils every 6 (six) hours as needed for congestion.    [provider]  Polyethyl Glycol-Propyl Glycol (SYSTANE OP) Place 1-2 drops into the left eye 3 (three) times daily as needed (dryness).    [provider]  Polyethyl Glycol-Propyl Glycol (SYSTANE) 0.4-0.3 % GEL ophthalmic gel Place 1 application into both eyes at bedtime.    [provider]  predniSONE (DELTASONE) 5 MG tablet Take 2 tablets (10 mg total) by mouth daily with breakfast. Patient taking differently: Take 5 mg by mouth every other day. 04/28/20   Iva Boop, MD  sertraline (ZOLOFT) 50 MG tablet Take 2 tablets (100 mg total) by mouth daily. 04/30/20   [name], MD  tizanidine (ZANAFLEX) 2 MG capsule Take 1 capsule (2 mg total) by mouth every 6 (six) hours as needed for muscle spasms. Patient not taking: No sig reported 02/19/20   Elodia Florence, PA-C  traMADol (ULTRAM) 50 MG tablet Take 1 tablet (50 mg total) by mouth every 6 (six) hours as needed for moderate pain or severe pain (post op pain). 06/17/20   Elodia Florence, PA-C  trolamine salicylate (ASPERCREME) 10 % cream Apply 1 application topically at bedtime.    [provider]    Allergies    Ace inhibitors, Warfarin sodium, Famotidine, Codeine, Delsym [dextromethorphan polistirex er], Lactose intolerance (gi), Phenylephrine, and Sulfamethoxazole-trimethoprim  Review of Systems   Review of Systems  Physical Exam Updated Vital Signs BP (!) 144/93 (BP Location: Right Arm)   Pulse 96   Temp 97.9 F (36.6 C)   Resp (!) 26   SpO2 96%   Physical Exam Vitals and nursing note reviewed.  Constitutional:       Appearance: She is well-developed.  HENT:     Head: Normocephalic and atraumatic.  Eyes:     General:  Right eye: No discharge.        Left eye: No discharge.     Conjunctiva/sclera: Conjunctivae normal.  Neck:     Trachea: No tracheal deviation.  Cardiovascular:     Rate and Rhythm: Normal rate. Rhythm irregular.  Pulmonary:     Effort: Pulmonary effort is normal.     Breath sounds: Examination of the right-lower field reveals rales. Examination of the left-lower field reveals rales. Rales present.  Abdominal:     General: There is no distension.     Palpations: Abdomen is soft.     Tenderness: There is no abdominal tenderness. There is no guarding.  Musculoskeletal:     Cervical back: Normal range of motion and neck supple.     Right lower leg: Edema present.     Left lower leg: Edema present.     Comments: Mild edema bilateral lower extremities.  Patient has surgical scar anterior right knee clean no sign of infection, no significant tenderness.  Skin:    General: Skin is warm.     Findings: No rash.  Neurological:     Mental Status: She is alert and oriented to person, place, and time.     ED Results / Procedures / Treatments   Labs (all labs ordered are listed, but only abnormal results are displayed) Labs Reviewed  BASIC METABOLIC PANEL - Abnormal; Notable for the following components:      Result Value   Sodium 133 (*)    Chloride 96 (*)    Glucose, Bld 120 (*)    All other components within normal limits  CBC - Abnormal; Notable for the following components:   RDW 15.6 (*)    All other components within normal limits  RESP PANEL BY RT-PCR (FLU A&B, COVID) ARPGX2  BRAIN NATRIURETIC PEPTIDE  TROPONIN I (HIGH SENSITIVITY)    EKG EKG Interpretation  Date/Time:  Tuesday June 23 2020 12:39:58 EDT Ventricular Rate:  86 PR Interval:    QRS Duration: 142 QT Interval:  386 QTC Calculation: 461 R Axis:   -7 Text Interpretation: Atrial fibrillation  Indeterminate axis Non-specific intra-ventricular conduction block Abnormal ECG Confirmed by Blane Ohara [phone]) on 06/23/2020 2:53:16 PM   Radiology DG Chest 2 View  Result Date: 06/23/2020 CLINICAL DATA:  Atrial fibrillation.  Nonischemic cardiomyopathy EXAM: CHEST - 2 VIEW COMPARISON:  06/09/2020 FINDINGS: Cardiac enlargement and aortic atherosclerosis. Small left pleural effusion. Similar appearance of diffuse increased reticular interstitial opacities. No superimposed airspace consolidation. IMPRESSION: 1. Cardiac enlargement and mild CHF. 2.  Emphysema (ICD10-J43.9). Electronically Signed   By: Signa Kell M.D.   On: 06/23/2020 14:12    Procedures Procedures   Medications Ordered in ED Medications  furosemide (LASIX) injection 40 mg (has no administration in time range)    ED Course  I have reviewed the triage vital signs and the nursing notes.  Pertinent labs & imaging results that were available during my care of the patient were reviewed by me and considered in my medical decision making (see chart for details).    MDM Rules/Calculators/A&P                          Patient with history of heart failure and recent surgery presents with worsening shortness of breath and weight gain.  Primary differential includes congestive heart failure given history and gradual worsening symptoms.  Also considered pulmonary embolism however symptoms started before the surgery and patient is on Eliquis so  less likely.  Discussed with cardiology for consult and admission, will hold CT angiogram at this time and treat for heart failure.  Lasix ordered.  BNP and troponin added.  EKG overall similar previous atrial fibrillation reviewed. Chest x-ray reviewed showing mild pulmonary edema and cardiomegaly.  Covid test pending for completeness. [name] was evaluated in Emergency Department on 06/23/2020 for the symptoms described in the history of present illness. She was evaluated in the  context of the global COVID-19 pandemic, which necessitated consideration that the patient might be at risk for infection with the SARS-CoV-2 virus that causes COVID-19. Institutional protocols and algorithms that pertain to the evaluation of patients at risk for COVID-19 are in a state of rapid change based on information released by regulatory bodies including the CDC and federal and state organizations. These policies and algorithms were followed during the patient's care in the ED.   Final Clinical Impression(s) / ED Diagnoses Final diagnoses:  History of atrial fibrillation  Bilateral leg edema  Acute pulmonary edema (HCC)  Weight gain    Rx / DC Orders ED Discharge Orders    None       Blane Ohara, MD 06/23/20 1535

## 2020-06-23 NOTE — H&P (Addendum)
Cardiology History and Physical:   Patient ID: [name] MRN: [id number]; DOB: 07-27-1930  Admit date: 06/23/2020 Date of Consult: 06/23/2020  PCP:  [name], MD   [facility]  Cardiologist:  [name], MD 05/20/2020 Advanced Practice Provider:  No care team member to display Electrophysiologist:  None  [id number]}   Patient Profile:   [name] is a 87 y.o. female with a hx of S-D-CHF, NICM by cath 2010 w/ EF 15-20% >> 55% 2015, Afib RVR, OA, anemia, GERD, IBS, LBBB, who is being seen today for the evaluation of CHF at the request of Dr Jodi Mourning.  History of Present Illness:   [name] was hospitalized 03/08-03/12/2020 for RIGHT RETINACULAR REPAIR. Wt on admit 158 lbs. Baseline wt 155 lbs.  She came back to the hospital today with increasing SOB, wt gain, cards asked to see.   [name]'s normal weight is about 155 lbs.   After she went home from the knee surgery, she developed progressive DOE. She got to the point that she could not get to the bathroom without panting for breath. She describes orthopnea and PND as well. She felt her breathing was labored and she was getting tired very quickly.  Today, her weight was 164 lbs on her home scales. The combination of all of these things decided her to call her PCP. Dr Para March told her to come in and she did so. Since being in the ER, she has put out almost 2 liters of urine, is breathing better but not back to baseline.  No chest pain.   She is chronically in atrial fibrillation all the time but generally does not feel it.   She has been off the Lasix for several months, stopped it because it was giving her diarrhea. Took it yesterday and today, got diarrhea but also had some urine output.   Past Medical History:  Diagnosis Date  . Acute combined systolic and diastolic heart failure (HCC) 05/02/2017  . Anemia   . Anxiety   . Arthritis    "knees" (06/18/2015)  . Atrial fibrillation  with RVR (HCC) 06/18/2015   h/o sig bleed on coumadin  . Cardiogenic shock (HCC) 05/02/2017  . Complication of anesthesia 2010   "w/cardiac cath; got into a psychotic state for ~ 3 days; got me out w/Valium"  . Depression    "I have it off and on; not as often as I've gotten older" (06/18/2015)  . Diverticulosis    descending and sigmoid colon--Dr. Jarold Motto  . DJD (degenerative joint disease) of knee    left  . Dyspnea    with exertion and in morning mostly when wakes up   . E coli infection    History of  . GERD (gastroesophageal reflux disease)   . History of Clostridium difficile colitis   . IBS (irritable bowel syndrome)   . Insomnia     off chronic ambien 5mg  as of 10/11, rare/episodic use since then  DCM/CHF  . LBBB (left bundle branch block)   . Lymphocytic colitis 09/27/2016  . Moderate to severe pulmonary hypertension (HCC) 11/26/2019  . Nonischemic cardiomyopathy (HCC)    normal coronaries, EF 15-20% 04/2008, EF 50-55% 2015  . Osteoporosis    on DXA 05/2010  . Positive PPD   . Retroperitoneal bleed   . Sciatica    Right    Past Surgical History:  Procedure Laterality Date  . ANTERIOR APPROACH HEMI HIP ARTHROPLASTY Left 08/05/2019  Procedure: ANTERIOR APPROACH HEMI HIP ARTHROPLASTY;  Surgeon: Jodi Geralds, MD;  Location: [facility] OR;  Service: Orthopedics;  Laterality: Left;  . BIOPSY  11/22/2017   Procedure: BIOPSY;  Surgeon: Iva Boop, MD;  Location: [facility] ENDOSCOPY;  Service: Endoscopy;;  . CARDIAC CATHETERIZATION  2010  . CARDIOVERSION N/A 06/19/2015   Procedure: CARDIOVERSION;  Surgeon: Laurey Morale, MD;  Location: [facility] ENDOSCOPY;  Service: Cardiovascular;  Laterality: N/A;  . CATARACT EXTRACTION W/ INTRAOCULAR LENS  IMPLANT, BILATERAL Bilateral   . COLONOSCOPY WITH PROPOFOL N/A 11/22/2017   Procedure: COLONOSCOPY WITH PROPOFOL;  Surgeon: Iva Boop, MD;  Location: [facility] ENDOSCOPY;  Service: Endoscopy;  Laterality: N/A;  . DILATION AND CURETTAGE OF UTERUS    . TEE  WITHOUT CARDIOVERSION N/A 06/19/2015   Procedure: TRANSESOPHAGEAL ECHOCARDIOGRAM (TEE);  Surgeon: Laurey Morale, MD;  Location: [facility] ENDOSCOPY;  Service: Cardiovascular;  Laterality: N/A;  . TOTAL KNEE ARTHROPLASTY Left 04/25/2017   Procedure: TOTAL KNEE ARTHROPLASTY;  Surgeon: [name], MD;  Location: [facility] OR;  Service: Orthopedics;  Laterality: Left;  . TOTAL KNEE ARTHROPLASTY Right 02/18/2020   Procedure: RIGHT TOTAL KNEE ARTHROPLASTY;  Surgeon: [name], MD;  Location: [facility] ORS;  Service: Orthopedics;  Laterality: Right;  . TOTAL KNEE REVISION Left 12/03/2019   Procedure: LEFT TOTAL KNEE REVISION  OF POYLY EXCHANGE;  Surgeon: [name], MD;  Location: [facility] ORS;  Service: Orthopedics;  Laterality: Left;  [name] VAGINAL HYSTERECTOMY  1979   ovaries intact     Home Medications:  Prior to Admission medications   Medication Sig Start Date End Date Taking? Authorizing Provider  acetaminophen (TYLENOL) 500 MG tablet Take 1,000 mg by mouth 2 (two) times daily.    [provider]  BIOTIN PO Take 1 tablet by mouth daily.    [provider]  buPROPion (WELLBUTRIN SR) 100 MG 12 hr tablet TAKE 1 TABLET (100 MG TOTAL) BY MOUTH IN THE MORNING. 05/10/20   [name], MD  calcium carbonate (TUMS - DOSED IN MG ELEMENTAL CALCIUM) 500 MG chewable tablet Chew 500 mg by mouth 2 (two) times daily as needed for indigestion or heartburn.    [provider]  cholecalciferol (VITAMIN D3) 25 MCG (1000 UNIT) tablet Take 1,000 Units by mouth daily.    [provider]  digoxin (LANOXIN) 0.125 MG tablet TAKE 1 TABLET BY MOUTH DAILY Patient taking differently: Take 0.125 mg by mouth daily. 02/17/20   [name], MD  ELIQUIS 2.5 MG TABS tablet TAKE 1 TABLET BY MOUTH TWICE A DAY Patient taking differently: Take 2.5 mg by mouth 2 (two) times daily. 12/20/19   [name], MD  KLOR-CON M20 20 MEQ tablet TAKE 1 TABLET BY MOUTH TWICE A DAY Patient taking differently:  Take 20 mEq by mouth daily. 05/18/20   [name], MD  loperamide (IMODIUM) 2 MG capsule Take 2 mg by mouth 3 (three) times daily as needed for diarrhea or loose stools.     [provider]  losartan (COZAAR) 25 MG tablet Take 1 tablet (25 mg total) by mouth daily. 05/20/20 08/18/20  [name], MD  melatonin 5 MG TABS Take 2.5 mg by mouth at bedtime as needed (sleep).    [provider]  Menthol-Methyl Salicylate (SALONPAS PAIN RELIEF PATCH EX) Apply 2 patches topically daily as needed (pain).    [provider]  metoprolol succinate (TOPROL-XL) 100 MG 24 hr tablet Take 1 tablet (100 mg total) by mouth daily. Take with or immediately following  a meal. 04/16/20 04/11/21  [name], MD  mirabegron ER (MYRBETRIQ) 25 MG TB24 tablet Take 1 tablet (25 mg total) by mouth daily. Patient not taking: Reported on 05/28/2020 11/13/19   [name], MD  MYRBETRIQ 50 MG TB24 tablet Take 50 mg by mouth daily. 05/16/20   [provider]  ondansetron (ZOFRAN) 4 MG tablet Take 1 tablet (4 mg total) by mouth every 8 (eight) hours as needed for nausea or vomiting. 06/16/20   Eustaquio Boyden, MD  phenylephrine (NEO-SYNEPHRINE) 1 % nasal spray Place 1 drop into both nostrils every 6 (six) hours as needed for congestion.    [provider]  Polyethyl Glycol-Propyl Glycol (SYSTANE OP) Place 1-2 drops into the left eye 3 (three) times daily as needed (dryness).    [provider]  Polyethyl Glycol-Propyl Glycol (SYSTANE) 0.4-0.3 % GEL ophthalmic gel Place 1 application into both eyes at bedtime.    [provider]  predniSONE (DELTASONE) 5 MG tablet Take 2 tablets (10 mg total) by mouth daily with breakfast. Patient taking differently: Take 5 mg by mouth every other day. 04/28/20   Iva Boop, MD  sertraline (ZOLOFT) 50 MG tablet Take 2 tablets (100 mg total) by mouth daily. 04/30/20   [name], MD  tizanidine (ZANAFLEX) 2 MG capsule Take  1 capsule (2 mg total) by mouth every 6 (six) hours as needed for muscle spasms. Patient not taking: No sig reported 02/19/20   Elodia Florence, PA-C  traMADol (ULTRAM) 50 MG tablet Take 1 tablet (50 mg total) by mouth every 6 (six) hours as needed for moderate pain or severe pain (post op pain). 06/17/20   Elodia Florence, PA-C  trolamine salicylate (ASPERCREME) 10 % cream Apply 1 application topically at bedtime.    [provider]    Inpatient Medications: Scheduled Meds:  Continuous Infusions:  PRN Meds:   Allergies:    Allergies  Allergen Reactions  . Ace Inhibitors Cough  . Warfarin Sodium Other (See Comments)    DOSE RELATED PHARMACOLOGIC EFFECT "bleed out"  . Famotidine Other (See Comments)    GI upset  . Codeine Other (See Comments)    sedation  . Delsym [Dextromethorphan Polistirex Er] Other (See Comments)    dizziness  . Lactose Intolerance (Gi) Diarrhea  . Lasix [Furosemide] Diarrhea    Oral Lasix gives her diarrhea, tolerates IV Rx  . Phenylephrine Palpitations and Other (See Comments)    Nasal spray- "likely increase in nasal congestion".   . Sulfamethoxazole-Trimethoprim Nausea And Vomiting    GI intolerance.    Social History:   Social History   Socioeconomic History  . Marital status: Married    Spouse name: Not on file  . Number of children: 2  . Years of education: Not on file  . Highest education level: Not on file  Occupational History  . Occupation: Retired    Associate Professor: RETIRED  Tobacco Use  . Smoking status: Never Smoker  . Smokeless tobacco: Never Used  Vaping Use  . Vaping Use: Never used  Substance and Sexual Activity  . Alcohol use: No    Alcohol/week: 0.0 standard drinks  . Drug use: No  . Sexual activity: Not Currently    Birth control/protection: Post-menopausal, Surgical    Comment: Hysterectomy  Other Topics Concern  . Not on file  Social History Narrative   Retired, former [employer]   Married, 1953    Lives with husband   2 grown children, one  in [place], one out of state   Tobacco Use - No.    Drug Use - no   teaches Sunday school, reading   Social Determinants of Health   Financial Resource Strain: Not on file  Food Insecurity: Not on file  Transportation Needs: Not on file  Physical Activity: Not on file  Stress: Not on file  Social Connections: Not on file  Intimate Partner Violence: Not on file    Family History:    Family History  Problem Relation Age of Onset  . Colon cancer Father        possible colon cancer  . Other Father        esophagus tumor?  . Tuberculosis Mother   . Breast cancer Neg Hx   . Stomach cancer Neg Hx   . Pancreatic cancer Neg Hx     Family Status  Relation Name Status  . Father  Deceased at age 44  . Mother  Deceased at age 81       TB  . MGM  Deceased  . MGF  Deceased  . PGM  Deceased  . PGF  Deceased  . Neg Hx  (Not Specified)    ROS:  Please see the history of present illness.  All other ROS reviewed and negative.     Physical Exam/Data:   Vitals:   06/23/20 1745 06/23/20 1800 06/23/20 1815 06/23/20 1830  BP: (!) 154/89 (!) 124/98 123/69 131/90  Pulse: 81 93 80 81  Resp: (!) 21 20 (!) 21 (!) 22  Temp:      SpO2: 96% 96% 94% 96%    Intake/Output Summary (Last 24 hours) at 06/23/2020 1840 Last data filed at 06/23/2020 1839 Gross per 24 hour  Intake --  Output 1900 ml  Net -1900 ml   Last 3 Weights 06/16/2020 06/16/2020 05/20/2020  Weight (lbs) 158 lb 158 lb 148 lb  Weight (kg) 71.668 kg 71.668 kg 67.132 kg     There is no height or weight on file to calculate BMI.  General:  Well nourished, well developed, in no acute distress HEENT: normal Lymph: no adenopathy Neck: JVD 10 cm Endocrine:  No thryomegaly Vascular: No carotid bruits; upper extremity pulses 2+ bilaterally Cardiac:  normal S1, S2; Irreg R&R; no murmur  Lungs: rales bases bilaterally, no wheezing, rhonchi  Abd: soft, nontender, no hepatomegaly  Ext: 1-2+  edema Musculoskeletal:  No deformities, BUE and BLE strength normal and equal Skin: warm and dry  Neuro:  CNs 2-12 intact, no focal abnormalities noted Psych:  Normal affect   EKG:  The EKG was personally reviewed and demonstrates:  Atrial fib, HR 97,  LBBB is old Telemetry:  Telemetry was personally reviewed and demonstrates:  Atrial fib, rate generally ok  Relevant CV Studies: LE DOPPLERS 06/23/2020 Summary:  BILATERAL:  - No evidence of deep vein thrombosis seen in the lower extremities,  bilaterally.  - No evidence of superficial venous thrombosis in the lower extremities,  bilaterally.  -No evidence of popliteal cyst, bilaterally.    ECHO: 08/05/2019 1. Left ventricular ejection fraction, by estimation, is 35%. The left  ventricle has moderate to severely decreased function. The left ventricle  demonstrates global hypokinesis. There is moderate left ventricular  hypertrophy. Left ventricular diastolic  parameters are indeterminate. There is the interventricular septum is  flattened in diastole ('D' shaped left ventricle), consistent with right  ventricular volume overload.  2. Right ventricular systolic function is moderately reduced. The right  ventricular size  is moderately enlarged. There is severely elevated  pulmonary artery systolic pressure. The estimated right ventricular  systolic pressure is 83.2 mmHg.  3. Left atrial size was mildly dilated.  4. Right atrial size was severely dilated.  5. The mitral valve is normal in structure. No evidence of mitral valve  regurgitation. No evidence of mitral stenosis.  6. The tricuspid valve is degenerative. Tricuspid valve regurgitation is  severe.  7. The aortic valve is grossly normal. Aortic valve regurgitation is not  visualized. No aortic stenosis is present.  8. The inferior vena cava is dilated in size with <50% respiratory  variability, suggesting right atrial pressure of 15 mmHg.   Comparison(s): A prior  study was performed on 08/03/2017. Prior images  reviewed side by side. RV size has increased and systolic function  decreased. TR has increased from mild-moderate to severe. LV function  appears slightly improved.   MYOVIEW: 05/04/2017 FINDINGS: Perfusion: There is suggestion potential subtle apical, distal septal and distal anteroseptal ischemia. There does appear to be a component of mild diminished perfusion of the distal anterior wall on both rest and stress studies. This may partly be due to overlying breast soft tissue.  Wall Motion: The septum is dyskinetic. The left ventricular apex is nearly akinetic. The rest of the left ventricle shows severe global hypokinesis with significant left ventricular cavity dilatation.  Left Ventricular Ejection Fraction: 23%.  End diastolic volume: 370 ml; End systolic volume: 488 ml  IMPRESSION: 1. Potential subtle ischemia involving the left ventricular apex, distal septum and distal anteroseptal wall. Mildly diminished perfusion on both stress and rest studies of the distal anterior wall may be related to scar or breast soft tissue attenuation.  2. Severe wall motion abnormalities with septal dyskinesis, apical kinesis and otherwise global hypokinesis. The left ventricular cavity is significantly dilated.  3. Left ventricular ejection fraction: 23%.  4. Non invasive risk stratification*: High  Laboratory Data:  High Sensitivity Troponin:   Recent Labs  Lab 06/23/20 1512  TROPONINIHS 12     Chemistry Recent Labs  Lab 06/23/20 1239  NA 133*  K 4.1  CL 96*  CO2 26  GLUCOSE 120*  BUN 15  CREATININE 0.82  CALCIUM 9.0  GFRNONAA >60  ANIONGAP 11    Lab Results  Component Value Date   ALT 11 04/30/2020   AST 16 04/30/2020   ALKPHOS 84 04/30/2020   BILITOT 0.8 04/30/2020   Hematology Recent Labs  Lab 06/23/20 1239  WBC 8.9  RBC 4.59  HGB 14.0  HCT 42.7  MCV 93.0  MCH 30.5  MCHC 32.8  RDW 15.6*  PLT 322    BNP Recent Labs  Lab 06/23/20 1530  BNP 3,972.5*    DDimer No results for input(s): DDIMER in the last 168 hours. Lab Results  Component Value Date   TSH 2.53 10/29/2019   Lab Results  Component Value Date   HGBA1C 5.3 04/27/2017   Lab Results  Component Value Date   CHOL 261 (H) 12/27/2018   HDL 32.20 (L) 12/27/2018   LDLCALC 73 02/05/2018   LDLDIRECT 58.0 12/27/2018   TRIG (H) 12/27/2018    424.0 Triglyceride is over 400; calculations on Lipids are invalid.   CHOLHDL 8 12/27/2018     Radiology/Studies:  DG Chest 2 View  Result Date: 06/23/2020 CLINICAL DATA:  Atrial fibrillation.  Nonischemic cardiomyopathy EXAM: CHEST - 2 VIEW COMPARISON:  06/09/2020 FINDINGS: Cardiac enlargement and aortic atherosclerosis. Small left pleural effusion. Similar appearance of diffuse  increased reticular interstitial opacities. No superimposed airspace consolidation. IMPRESSION: 1. Cardiac enlargement and mild CHF. 2.  Emphysema (ICD10-J43.9). Electronically Signed   By: Signa Kell M.D.   On: 06/23/2020 14:12   VAS [place] LOWER EXTREMITY VENOUS (DVT) (ONLY [facility] 7a-7p)  Result Date: 06/23/2020  Lower Venous DVT Study Indications: Swelling.  Comparison Study: no prior Performing Technologist: Blanch Media RVS  Examination Guidelines: A complete evaluation includes B-mode imaging, spectral Doppler, color Doppler, and power Doppler as needed of all accessible portions of each vessel. Bilateral testing is considered an integral part of a complete examination. Limited examinations for reoccurring indications may be performed as noted. The reflux portion of the exam is performed with the patient in reverse Trendelenburg.  +---------+---------------+---------+-----------+----------+--------------+ RIGHT    CompressibilityPhasicitySpontaneityPropertiesThrombus Aging +---------+---------------+---------+-----------+----------+--------------+ CFV      Full           Yes      Yes                                  +---------+---------------+---------+-----------+----------+--------------+ SFJ      Full                                                        +---------+---------------+---------+-----------+----------+--------------+ FV Prox  Full                                                        +---------+---------------+---------+-----------+----------+--------------+ FV Mid                  Yes      Yes                                 +---------+---------------+---------+-----------+----------+--------------+ FV Distal               Yes      Yes                                 +---------+---------------+---------+-----------+----------+--------------+ PFV      Full                                                        +---------+---------------+---------+-----------+----------+--------------+ POP      Full           Yes      Yes                                 +---------+---------------+---------+-----------+----------+--------------+ PTV      Full                                                        +---------+---------------+---------+-----------+----------+--------------+  PERO     Full                                                        +---------+---------------+---------+-----------+----------+--------------+   +---------+---------------+---------+-----------+----------+--------------+ LEFT     CompressibilityPhasicitySpontaneityPropertiesThrombus Aging +---------+---------------+---------+-----------+----------+--------------+ CFV      Full           Yes      Yes                                 +---------+---------------+---------+-----------+----------+--------------+ SFJ      Full                                                        +---------+---------------+---------+-----------+----------+--------------+ FV Prox  Full                                                         +---------+---------------+---------+-----------+----------+--------------+ FV Mid   Full                                                        +---------+---------------+---------+-----------+----------+--------------+ FV DistalFull                                                        +---------+---------------+---------+-----------+----------+--------------+ PFV      Full                                                        +---------+---------------+---------+-----------+----------+--------------+ POP      Full           Yes      Yes                                 +---------+---------------+---------+-----------+----------+--------------+ PTV      Full                                                        +---------+---------------+---------+-----------+----------+--------------+ PERO     Full                                                        +---------+---------------+---------+-----------+----------+--------------+  Summary: BILATERAL: - No evidence of deep vein thrombosis seen in the lower extremities, bilaterally. - No evidence of superficial venous thrombosis in the lower extremities, bilaterally. -No evidence of popliteal cyst, bilaterally.   *See table(s) above for measurements and observations. Electronically signed by Waverly Ferrarihristopher Dickson MD on 06/23/2020 at 4:47:05 PM.    Final      Assessment and Plan:   1. Acute on chronic combined systolic and diastolic CHF: - Wt is up at least 10 lbs - EF 35%, PAS was 83 in 2021, recheck echo - continue Lasix 40 mg IV bid for now => has already had about 2 and half liters out with 1 dose.  She is about halfway through her necessary diuresis. - follow renal function, daily wts and daily BMET  2. Perm Afib - HR ok for now, continue home rx of digoxin 0.125 mg qd and Toprol XL 100 mg qd - continue Eliquis 2.5 mg bid  3. S/p knee surgery - have PT/OT see - currently ambulates with a  walker   Risk Assessment/Risk Scores:    :[id number]}    New York Heart Association (NYHA) Functional Class NYHA Class IV  CHA2DS2-VASc Score = 6  This indicates a 9.7% annual risk of stroke. The patient's score is based upon: CHF History: Yes HTN History: Yes Diabetes History: No Stroke History: No Vascular Disease History: Yes Age Score: 2 Gender Score: 1      For questions or updates, please contact [facility] Please consult [email or web address] for contact info under    Signed, Theodore DemarkRhonda Barrett, PA-C  06/23/2020 6:40 PM  ATTENDING ATTESTATION  I have seen, examined and evaluated the patient this PM along with  Theodore Demarkhonda Barrett, PA-C.  After reviewing all the available data and chart, we discussed the patients laboratory, study & physical findings as well as symptoms in detail. I agree with her findings, examination as well as impression recommendations as per our discussion.    Attending adjustments noted in italics.   Seems to be relatively simple presentation with acute on chronic mild systolic and diastolic heart failure.  I suspect she probably got extra IV fluids during her knee surgery, and has been somewhat lax in her dietary discretion.  She denies any chest pain or pressure or any symptoms of A. fib.   Roughly 10 pounds of her baseline weight.  She is already had about 2 and half liters out which is close to 6 pounds.  I think we probably have an few more dose of IV Lasix before we can potentially convert to oral diuretic tomorrow. Unfortunately she has not tolerated Lasix in the past, indicating that she gets diarrhea with oral Lasix => we will discuss with pharmacy, I suspect it would probably be better off converting to either Bumex or Demadex.  Heart rate stable in A. fib, on anticoagulation.  No issues.     Bryan Lemmaavid Shane Badeaux, M.D., M.S. Interventional Cardiologist   Pager # [phone] Phone # [phone] [address] [place], [place]  [id number]

## 2020-06-23 NOTE — Progress Notes (Signed)
Lower extremity venous has been completed.   Preliminary results in CV Proc.   Blanch Media 06/23/2020 3:58 PM

## 2020-06-23 NOTE — Telephone Encounter (Signed)
Sending note to Dr Para March.

## 2020-06-24 ENCOUNTER — Observation Stay (HOSPITAL_COMMUNITY): Payer: Medicare Other

## 2020-06-24 ENCOUNTER — Encounter (HOSPITAL_COMMUNITY): Payer: Self-pay | Admitting: Orthopaedic Surgery

## 2020-06-24 ENCOUNTER — Other Ambulatory Visit: Payer: Self-pay

## 2020-06-24 ENCOUNTER — Encounter: Payer: Self-pay | Admitting: Family Medicine

## 2020-06-24 DIAGNOSIS — F419 Anxiety disorder, unspecified: Secondary | ICD-10-CM | POA: Diagnosis present

## 2020-06-24 DIAGNOSIS — K219 Gastro-esophageal reflux disease without esophagitis: Secondary | ICD-10-CM | POA: Diagnosis present

## 2020-06-24 DIAGNOSIS — Z7901 Long term (current) use of anticoagulants: Secondary | ICD-10-CM | POA: Diagnosis not present

## 2020-06-24 DIAGNOSIS — I428 Other cardiomyopathies: Secondary | ICD-10-CM | POA: Diagnosis not present

## 2020-06-24 DIAGNOSIS — I4821 Permanent atrial fibrillation: Secondary | ICD-10-CM | POA: Diagnosis present

## 2020-06-24 DIAGNOSIS — Z888 Allergy status to other drugs, medicaments and biological substances status: Secondary | ICD-10-CM | POA: Diagnosis not present

## 2020-06-24 DIAGNOSIS — K589 Irritable bowel syndrome without diarrhea: Secondary | ICD-10-CM

## 2020-06-24 DIAGNOSIS — Z20822 Contact with and (suspected) exposure to covid-19: Secondary | ICD-10-CM | POA: Diagnosis present

## 2020-06-24 DIAGNOSIS — I110 Hypertensive heart disease with heart failure: Secondary | ICD-10-CM | POA: Diagnosis present

## 2020-06-24 DIAGNOSIS — G4700 Insomnia, unspecified: Secondary | ICD-10-CM | POA: Diagnosis present

## 2020-06-24 DIAGNOSIS — R635 Abnormal weight gain: Secondary | ICD-10-CM | POA: Diagnosis present

## 2020-06-24 DIAGNOSIS — E876 Hypokalemia: Secondary | ICD-10-CM | POA: Diagnosis present

## 2020-06-24 DIAGNOSIS — Z96642 Presence of left artificial hip joint: Secondary | ICD-10-CM | POA: Diagnosis present

## 2020-06-24 DIAGNOSIS — I5041 Acute combined systolic (congestive) and diastolic (congestive) heart failure: Secondary | ICD-10-CM

## 2020-06-24 DIAGNOSIS — I4811 Longstanding persistent atrial fibrillation: Secondary | ICD-10-CM | POA: Diagnosis not present

## 2020-06-24 DIAGNOSIS — Z882 Allergy status to sulfonamides status: Secondary | ICD-10-CM | POA: Diagnosis not present

## 2020-06-24 DIAGNOSIS — I5043 Acute on chronic combined systolic (congestive) and diastolic (congestive) heart failure: Secondary | ICD-10-CM

## 2020-06-24 DIAGNOSIS — I255 Ischemic cardiomyopathy: Secondary | ICD-10-CM | POA: Diagnosis present

## 2020-06-24 DIAGNOSIS — Z79899 Other long term (current) drug therapy: Secondary | ICD-10-CM | POA: Diagnosis not present

## 2020-06-24 DIAGNOSIS — Z7952 Long term (current) use of systemic steroids: Secondary | ICD-10-CM | POA: Diagnosis not present

## 2020-06-24 DIAGNOSIS — I447 Left bundle-branch block, unspecified: Secondary | ICD-10-CM | POA: Diagnosis present

## 2020-06-24 DIAGNOSIS — Z8619 Personal history of other infectious and parasitic diseases: Secondary | ICD-10-CM | POA: Diagnosis not present

## 2020-06-24 DIAGNOSIS — Z96653 Presence of artificial knee joint, bilateral: Secondary | ICD-10-CM | POA: Diagnosis present

## 2020-06-24 DIAGNOSIS — Z885 Allergy status to narcotic agent status: Secondary | ICD-10-CM | POA: Diagnosis not present

## 2020-06-24 LAB — ECHOCARDIOGRAM COMPLETE
Area-P 1/2: 4.39 cm2
Calc EF: 26.8 %
Height: 65 in
S' Lateral: 4.4 cm
Single Plane A2C EF: 28.6 %
Single Plane A4C EF: 29.3 %
Weight: 2465.6 oz

## 2020-06-24 LAB — BASIC METABOLIC PANEL
Anion gap: 12 (ref 5–15)
BUN: 15 mg/dL (ref 8–23)
CO2: 29 mmol/L (ref 22–32)
Calcium: 8.9 mg/dL (ref 8.9–10.3)
Chloride: 94 mmol/L — ABNORMAL LOW (ref 98–111)
Creatinine, Ser: 0.8 mg/dL (ref 0.44–1.00)
GFR, Estimated: >60 mL/min (ref >60)
Glucose, Bld: 91 mg/dL (ref 70–99)
Potassium: 3.5 mmol/L (ref 3.5–5.1)
Sodium: 135 mmol/L (ref 135–145)

## 2020-06-24 LAB — HEMOGLOBIN A1C
Hgb A1c MFr Bld: 5.4 % (ref 4.8–5.6)
Mean Plasma Glucose: 108.28 mg/dL

## 2020-06-24 LAB — TSH: TSH: 4.356 u[IU]/mL (ref 0.350–4.500)

## 2020-06-24 LAB — HEPATIC FUNCTION PANEL
ALT: 19 U/L (ref 0–44)
AST: 27 U/L (ref 15–41)
Albumin: 3.1 g/dL — ABNORMAL LOW (ref 3.5–5.0)
Alkaline Phosphatase: 73 U/L (ref 38–126)
Bilirubin, Direct: 0.5 mg/dL — ABNORMAL HIGH (ref 0.0–0.2)
Indirect Bilirubin: 1.1 mg/dL — ABNORMAL HIGH (ref 0.3–0.9)
Total Bilirubin: 1.6 mg/dL — ABNORMAL HIGH (ref 0.3–1.2)
Total Protein: 5.8 g/dL — ABNORMAL LOW (ref 6.5–8.1)

## 2020-06-24 MED ORDER — MUSCLE RUB 10-15 % EX CREA
1.0000 "application " | TOPICAL_CREAM | Freq: Every day | CUTANEOUS | Status: DC
  Administered 2020-06-24 (×2): 1 via TOPICAL
  Filled 2020-06-24: qty 85

## 2020-06-24 MED ORDER — ARTIFICIAL TEARS OPHTHALMIC OINT
1.0000 "application " | TOPICAL_OINTMENT | Freq: Every day | OPHTHALMIC | Status: DC
  Administered 2020-06-24 (×2): 1 via OPHTHALMIC
  Filled 2020-06-24: qty 3.5

## 2020-06-24 NOTE — Progress Notes (Signed)
PT Cancellation Note  Patient Details Name: SHATASHA LAMBING MRN: [id number] DOB: [birth year]   Cancelled Treatment:    Reason Eval/Treat Not Completed: Patient declined, no reason specified. PT attempted to see the patient multiple times, unfortunately the patient reports recently returning to bed and declines further mobility upon each PT attempt. PT will attempt to follow up as time allows.   Arlyss Gandy 06/24/2020, 5:42 PM

## 2020-06-24 NOTE — Plan of Care (Signed)

## 2020-06-24 NOTE — TOC Benefit Eligibility Note (Signed)
Patient Product/process development scientist completed.    The patient is currently admitted and upon discharge could be taking Entresto 24mg /26mg .  The current 30 day co-pay is, $47.00.   The patient is currently admitted and upon discharge could be taking Jardiance 10 mg.  The current 30 day co-pay is, $47.00.   The patient is currently admitted and upon discharge could be taking Farxiga 10 mg.  The current 30 day co-pay is, $47.00.   The patient is insured through Part D    [employer], CPhT Pharmacy Patient Advocate Specialist [facility] Antimicrobial Stewardship Team Direct Number: [phone]  Fax: [phone]

## 2020-06-24 NOTE — Progress Notes (Signed)
  Echocardiogram 2D Echocardiogram has been performed.  Janalyn Harder 06/24/2020, 12:21 PM

## 2020-06-24 NOTE — Telephone Encounter (Signed)
I saw the patient went to the ER and was admitted.  I will await the inpatient notes.

## 2020-06-25 ENCOUNTER — Other Ambulatory Visit: Payer: Self-pay | Admitting: Physician Assistant

## 2020-06-25 ENCOUNTER — Encounter (HOSPITAL_COMMUNITY): Payer: Self-pay | Admitting: Cardiology

## 2020-06-25 DIAGNOSIS — I4811 Longstanding persistent atrial fibrillation: Secondary | ICD-10-CM

## 2020-06-25 DIAGNOSIS — I428 Other cardiomyopathies: Secondary | ICD-10-CM

## 2020-06-25 DIAGNOSIS — I447 Left bundle-branch block, unspecified: Secondary | ICD-10-CM

## 2020-06-25 LAB — DIGOXIN LEVEL: Digoxin Level: 0.9 ng/mL (ref 0.8–2.0)

## 2020-06-25 LAB — BASIC METABOLIC PANEL
Anion gap: 11 (ref 5–15)
BUN: 14 mg/dL (ref 8–23)
CO2: 31 mmol/L (ref 22–32)
Calcium: 8.8 mg/dL — ABNORMAL LOW (ref 8.9–10.3)
Chloride: 90 mmol/L — ABNORMAL LOW (ref 98–111)
Creatinine, Ser: 1.03 mg/dL — ABNORMAL HIGH (ref 0.44–1.00)
GFR, Estimated: 53 mL/min — ABNORMAL LOW (ref >60)
Glucose, Bld: 86 mg/dL (ref 70–99)
Potassium: 3.3 mmol/L — ABNORMAL LOW (ref 3.5–5.1)
Sodium: 132 mmol/L — ABNORMAL LOW (ref 135–145)

## 2020-06-25 MED ORDER — TORSEMIDE 10 MG PO TABS: ORAL_TABLET | ORAL | 3 refills | Status: DC

## 2020-06-25 MED ORDER — TORSEMIDE 20 MG PO TABS
10.0000 mg | ORAL_TABLET | Freq: Every day | ORAL | Status: DC
  Administered 2020-06-25: 10 mg via ORAL
  Filled 2020-06-25: qty 1

## 2020-06-25 MED ORDER — POTASSIUM CHLORIDE CRYS ER 20 MEQ PO TBCR: 40.0000 meq | EXTENDED_RELEASE_TABLET | Freq: Every day | ORAL | 3 refills | Status: DC

## 2020-06-25 MED ORDER — DIGOXIN 125 MCG PO TABS
0.1250 mg | ORAL_TABLET | Freq: Every day | ORAL | Status: DC
  Administered 2020-06-25: 0.125 mg via ORAL
  Filled 2020-06-25: qty 1

## 2020-06-25 MED ORDER — DIGOXIN 62.5 MCG PO TABS: 0.0625 mg | ORAL_TABLET | Freq: Every day | ORAL | 6 refills | Status: DC

## 2020-06-25 MED ORDER — POTASSIUM CHLORIDE CRYS ER 20 MEQ PO TBCR
40.0000 meq | EXTENDED_RELEASE_TABLET | Freq: Every day | ORAL | Status: DC
  Administered 2020-06-25: 40 meq via ORAL
  Filled 2020-06-25: qty 2

## 2020-06-25 MED ORDER — DAPAGLIFLOZIN PROPANEDIOL 10 MG PO TABS: 10.0000 mg | ORAL_TABLET | Freq: Every day | ORAL | 6 refills | Status: DC

## 2020-06-25 NOTE — Discharge Instructions (Signed)
   1. Weigh yourself EVERY morning after you go to the bathroom but before you eat or drink anything. Write this number down in a weight log/diary. If you gain 3 pounds overnight or 5 pounds in a week, call the office. 2. Take your medicines as prescribed. If you have concerns about your medications, please call us before you stop taking them.  3. Eat low salt foods--Limit salt (sodium) to 2000 mg per day. This will help prevent your body from holding onto fluid. Read food labels as many processed foods have a lot of sodium, especially canned goods and prepackaged meats. If you would like some assistance choosing low sodium foods, we would be happy to set you up with a nutritionist. 4. Stay as active as you can everyday. Staying active will give you more energy and make your muscles stronger. Start with 5 minutes at a time and work your way up to 30 minutes a day. Break up your activities--do some in the morning and some in the afternoon. Start with 3 days per week and work your way up to 5 days as you can.  If you have chest pain, feel short of breath, dizzy, or lightheaded, STOP. If you don't feel better after a short rest, call 911. If you do feel better, call the office to let us know you have symptoms with exercise. 5. Limit all fluids for the day to less than 2 liters. Fluid includes all drinks, coffee, juice, ice chips, soup, jello, and all other liquids.  

## 2020-06-25 NOTE — Progress Notes (Signed)
D/C instructions given and reviewed. Tele and IV removed, tolerated well. Husband at bedside to transport. Notified staff of pt dressing needs.

## 2020-06-25 NOTE — Plan of Care (Signed)

## 2020-06-25 NOTE — Progress Notes (Signed)
Progress Note  Patient Name: [name] Date of Encounter: 06/25/2020  [facility] Cardiologist: [name], MD   Subjective   She feels much better.  Breathing is definitely improved, swelling is improved. She has actually been mobilized with physical therapy.  Inpatient Medications    Scheduled Meds: . apixaban  2.5 mg Oral BID  . artificial tears  1 application Both Eyes QHS  . buPROPion  100 mg Oral q AM  . cholecalciferol  1,000 Units Oral Daily  . digoxin  0.125 mg Oral Daily  . furosemide  40 mg Intravenous BID  . losartan  25 mg Oral Daily  . metoprolol succinate  100 mg Oral Daily  . Muscle Rub  1 application Topical QHS  . potassium chloride SA  20 mEq Oral Daily  . predniSONE  5 mg Oral Q breakfast  . sertraline  100 mg Oral Daily  . sodium chloride flush  3 mL Intravenous Q12H   Continuous Infusions: . sodium chloride     PRN Meds: sodium chloride, acetaminophen, ALPRAZolam, loperamide, melatonin, nitroGLYCERIN, ondansetron (ZOFRAN) IV, sodium chloride flush, traMADol, zolpidem   Vital Signs    Vitals:   06/24/20 1942 06/24/20 1943 06/25/20 0035 06/25/20 0036  BP:  126/62    Pulse: 81 84    Resp: 18 18    Temp:  97.7 F (36.5 C)    TempSrc:  Oral    SpO2: 94% 95% (!) 85% 96%  Weight:      Height:        Intake/Output Summary (Last 24 hours) at 06/25/2020 0051 Last data filed at 06/24/2020 2111 Gross per 24 hour  Intake 938 ml  Output 2500 ml  Net -1562 ml   Last 3 Weights 06/24/2020 06/16/2020 06/16/2020  Weight (lbs) 154 lb 1.6 oz 158 lb 158 lb  Weight (kg) 69.899 kg 71.668 kg 71.668 kg      Telemetry    Atrial fibrillation with intermittent ectopy, underlying LBBB.- Personally Reviewed  ECG    No new study- Personally Reviewed  Physical Exam   GEN:  Lying in bed, no acute distress.  Breathing easy. Neck:  Mild [place] has been no obvious JVD Cardiac:  Irregularly irregular, rate controlled, no murmurs, rubs, or gallops.   Respiratory: Clear to auscultation bilaterally.  Nonlabored, good air movement. GI: Soft, nontender, non-distended; no HSM MS:  Trivial ankle edema.  Right leg/knee in immobilizer brace. Neuro:   CN-12 grossly intact.  Nonfocal. Psych: Normal mood and affect   Labs    High Sensitivity Troponin:   Recent Labs  Lab 06/23/20 1512  TROPONINIHS 12      Chemistry Recent Labs  Lab 06/23/20 1239 06/24/20 0019 06/24/20 0214  NA 133*  --  135  K 4.1  --  3.5  CL 96*  --  94*  CO2 26  --  29  GLUCOSE 120*  --  91  BUN 15  --  15  CREATININE 0.82  --  0.80  CALCIUM 9.0  --  8.9  PROT  --  5.8*  --   ALBUMIN  --  3.1*  --   AST  --  27  --   ALT  --  19  --   ALKPHOS  --  73  --   BILITOT  --  1.6*  --   GFRNONAA >60  --  >60  ANIONGAP 11  --  12     Hematology Recent Labs  Lab  06/23/20 1239  WBC 8.9  RBC 4.59  HGB 14.0  HCT 42.7  MCV 93.0  MCH 30.5  MCHC 32.8  RDW 15.6*  PLT 322    BNP Recent Labs  Lab 06/23/20 1530  BNP 3,972.5*     DDimer No results for input(s): DDIMER in the last 168 hours.   Radiology    DG Chest 2 View  Result Date: 06/23/2020 CLINICAL DATA:  Atrial fibrillation.  Nonischemic cardiomyopathy EXAM: CHEST - 2 VIEW COMPARISON:  06/09/2020 FINDINGS: Cardiac enlargement and aortic atherosclerosis. Small left pleural effusion. Similar appearance of diffuse increased reticular interstitial opacities. No superimposed airspace consolidation. IMPRESSION: 1. Cardiac enlargement and mild CHF. 2.  Emphysema (ICD10-J43.9). Electronically Signed   By: Signa Kellaylor  Stroud M.D.   On: 06/23/2020 14:12   ECHOCARDIOGRAM COMPLETE  Result Date: 06/24/2020    ECHOCARDIOGRAM REPORT   Patient Name:   [name] Date of Exam: 06/24/2020 Medical Rec #:  [id number]       Height:       65.0 in Accession #:    [id number]      Weight:       154.1 lb Date of Birth:  08/24/1930       BSA:          1.771 m Patient Age:    87 years        BP:           131/68 mmHg  Patient Gender: F               HR:           67 bpm. Exam Location:  Inpatient Procedure: 2D Echo, 3D Echo, Cardiac Doppler, Color Doppler and Strain Analysis Indications:    I50.40* Unspecified combined systolic (congestive) and diastolic                 (congestive) heart failure  History:        Patient has prior history of Echocardiogram examinations, most                 recent 08/05/2019. Cardiomyopathy and CHF, Abnormal ECG,                 Pulmonary HTN; Arrythmias:Atrial Fibrillation and LBBB. Edema.                 Non-ischemic cardiomyopathy.  Sonographer:    Sheralyn Boatmanina West RDCS Referring Phys: [id number] RHONDA G BARRETT  Sonographer Comments: Exam paused for patient care. IMPRESSIONS  1. Left ventricular ejection fraction, by estimation, is 25 to 30%. Left ventricular ejection fraction by 3D volume is 30 %. Left ventricular ejection fraction by 2D MOD biplane is 26.8 %. The left ventricle has severely decreased function. The left ventricle demonstrates global hypokinesis. Left ventricular diastolic function could not be evaluated. The average left ventricular global longitudinal strain is -7.9 %. The global longitudinal strain is abnormal.  2. Right ventricular systolic function is mildly reduced. The right ventricular size is mildly enlarged. There is mildly elevated pulmonary artery systolic pressure. The estimated right ventricular systolic pressure is 38.3 mmHg.  3. Left atrial size was severely dilated.  4. Right atrial size was severely dilated.  5. The mitral valve is grossly normal. Mild mitral valve regurgitation. No evidence of mitral stenosis.  6. Tricuspid valve regurgitation is moderate.  7. The aortic valve is tricuspid. There is mild calcification of the aortic valve. There is mild thickening of the aortic valve. Aortic valve regurgitation  is mild. Mild aortic valve sclerosis is present, with no evidence of aortic valve stenosis.  8. The inferior vena cava is normal in size with greater than 50%  respiratory variability, suggesting right atrial pressure of 3 mmHg. Comparison(s): Changes from prior study are noted. LVEF now 25-30%. RV size and function have improved. RVSP improved from prior. FINDINGS  Left Ventricle: Left ventricular ejection fraction, by estimation, is 25 to 30%. Left ventricular ejection fraction by 2D MOD biplane is 26.8 %. Left ventricular ejection fraction by 3D volume is 30 %. The left ventricle has severely decreased function.  The left ventricle demonstrates global hypokinesis. The average left ventricular global longitudinal strain is -7.9 %. The global longitudinal strain is abnormal. 3D left ventricular ejection fraction analysis performed but not reported based on interpreter judgement due to suboptimal quality. The left ventricular internal cavity size was normal in size. There is no left ventricular hypertrophy. Abnormal (paradoxical) septal motion, consistent with left bundle branch block. Left ventricular diastolic function could not be evaluated due to atrial fibrillation. Left ventricular diastolic function could not be evaluated. Right Ventricle: The right ventricular size is mildly enlarged. No increase in right ventricular wall thickness. Right ventricular systolic function is mildly reduced. There is mildly elevated pulmonary artery systolic pressure. The tricuspid regurgitant  velocity is 2.97 m/s, and with an assumed right atrial pressure of 3 mmHg, the estimated right ventricular systolic pressure is 38.3 mmHg. Left Atrium: Left atrial size was severely dilated. Right Atrium: Right atrial size was severely dilated. Pericardium: Trivial pericardial effusion is present. Mitral Valve: The mitral valve is grossly normal. Mild mitral valve regurgitation. No evidence of mitral valve stenosis. Tricuspid Valve: The tricuspid valve is grossly normal. Tricuspid valve regurgitation is moderate . No evidence of tricuspid stenosis. Aortic Valve: The aortic valve is tricuspid.  There is mild calcification of the aortic valve. There is mild thickening of the aortic valve. Aortic valve regurgitation is mild. Mild aortic valve sclerosis is present, with no evidence of aortic valve stenosis. Pulmonic Valve: The pulmonic valve was grossly normal. Pulmonic valve regurgitation is trivial. No evidence of pulmonic stenosis. Aorta: The aortic root and ascending aorta are structurally normal, with no evidence of dilitation. Venous: The inferior vena cava is normal in size with greater than 50% respiratory variability, suggesting right atrial pressure of 3 mmHg. IAS/Shunts: The atrial septum is grossly normal. Additional Comments: There is a small pleural effusion in the left lateral region.  LEFT VENTRICLE PLAX 2D                        Biplane EF (MOD) LVIDd:         5.30 cm         LV Biplane EF:   Left LVIDs:         4.40 cm                          ventricular LV PW:         1.50 cm                          ejection LV IVS:        1.20 cm                          fraction by LVOT diam:     1.80 cm  2D MOD LV SV:         43                               biplane is LV SV Index:   24                               26.8 %. LVOT Area:     2.54 cm                                Diastology                                LV e' medial:    3.44 cm/s LV Volumes (MOD)               LV E/e' medial:  30.5 LV vol d, MOD    111.0 ml      LV e' lateral:   8.37 cm/s A2C:                           LV E/e' lateral: 12.5 LV vol d, MOD    94.8 ml A4C:                           2D LV vol s, MOD    79.2 ml       Longitudinal A2C:                           Strain LV vol s, MOD    67.0 ml       2D Strain GLS  -7.9 % A4C:                           Avg: LV SV MOD A2C:   31.8 ml LV SV MOD A4C:   94.8 ml       3D Volume EF LV SV MOD BP:    28.2 ml       LV 3D EF:    Left                                             ventricular                                             ejection                                              fraction by                                             3D volume  is 30 %.                                 3D Volume EF:                                3D EF:        30 % RIGHT VENTRICLE         IVC TAPSE (M-mode): 1.8 cm  IVC diam: 1.70 cm LEFT ATRIUM              Index       RIGHT ATRIUM           Index LA diam:        4.70 cm  2.65 cm/m  RA Area:     27.20 cm LA Vol (A2C):   102.0 ml 57.61 ml/m RA Volume:   95.70 ml  54.05 ml/m LA Vol (A4C):   88.0 ml  49.70 ml/m LA Biplane Vol: 95.0 ml  53.65 ml/m  AORTIC VALVE             PULMONIC VALVE LVOT Vmax:   107.00 cm/s PR End Diast Vel: 1.93 msec LVOT Vmean:  75.700 cm/s LVOT VTI:    0.170 m  AORTA Ao Root diam: 3.40 cm MITRAL VALVE                TRICUSPID VALVE MV Area (PHT): 4.39 cm     TR Peak grad:   35.3 mmHg MV Decel Time: 173 msec     TR Vmax:        297.00 cm/s MV E velocity: 105.00 cm/s MV A velocity: 39.40 cm/s   SHUNTS MV E/A ratio:  2.66         Systemic VTI:  0.17 m                             Systemic Diam: 1.80 cm Lennie Odor MD Electronically signed by Lennie Odor MD Signature Date/Time: 06/24/2020/12:47:24 PM    Final    VAS [place] LOWER EXTREMITY VENOUS (DVT) (ONLY [facility] & [facility] 7a-7p)  Result Date: 06/23/2020  Lower Venous DVT Study Indications: Swelling.  Comparison Study: no prior Performing Technologist: Blanch Media RVS  Examination Guidelines: A complete evaluation includes B-mode imaging, spectral Doppler, color Doppler, and power Doppler as needed of all accessible portions of each vessel. Bilateral testing is considered an integral part of a complete examination. Limited examinations for reoccurring indications may be performed as noted. The reflux portion of the exam is performed with the patient in reverse Trendelenburg.  +---------+---------------+---------+-----------+----------+--------------+ RIGHT    CompressibilityPhasicitySpontaneityPropertiesThrombus Aging  +---------+---------------+---------+-----------+----------+--------------+ CFV      Full           Yes      Yes                                 +---------+---------------+---------+-----------+----------+--------------+ SFJ      Full                                                        +---------+---------------+---------+-----------+----------+--------------+  FV Prox  Full                                                        +---------+---------------+---------+-----------+----------+--------------+ FV Mid                  Yes      Yes                                 +---------+---------------+---------+-----------+----------+--------------+ FV Distal               Yes      Yes                                 +---------+---------------+---------+-----------+----------+--------------+ PFV      Full                                                        +---------+---------------+---------+-----------+----------+--------------+ POP      Full           Yes      Yes                                 +---------+---------------+---------+-----------+----------+--------------+ PTV      Full                                                        +---------+---------------+---------+-----------+----------+--------------+ PERO     Full                                                        +---------+---------------+---------+-----------+----------+--------------+   +---------+---------------+---------+-----------+----------+--------------+ LEFT     CompressibilityPhasicitySpontaneityPropertiesThrombus Aging +---------+---------------+---------+-----------+----------+--------------+ CFV      Full           Yes      Yes                                 +---------+---------------+---------+-----------+----------+--------------+ SFJ      Full                                                         +---------+---------------+---------+-----------+----------+--------------+ FV Prox  Full                                                        +---------+---------------+---------+-----------+----------+--------------+  FV Mid   Full                                                        +---------+---------------+---------+-----------+----------+--------------+ FV DistalFull                                                        +---------+---------------+---------+-----------+----------+--------------+ PFV      Full                                                        +---------+---------------+---------+-----------+----------+--------------+ POP      Full           Yes      Yes                                 +---------+---------------+---------+-----------+----------+--------------+ PTV      Full                                                        +---------+---------------+---------+-----------+----------+--------------+ PERO     Full                                                        +---------+---------------+---------+-----------+----------+--------------+     Summary: BILATERAL: - No evidence of deep vein thrombosis seen in the lower extremities, bilaterally. - No evidence of superficial venous thrombosis in the lower extremities, bilaterally. -No evidence of popliteal cyst, bilaterally.   *See table(s) above for measurements and observations. Electronically signed by Waverly Ferrari MD on 06/23/2020 at 4:47:05 PM.    Final     Cardiac Studies   TTE 06/24/2020: Newly reduced EF down to 25 and 30% from 30-35% on previous echo.  Severely reduced function.  Global HK.  Abnormal global longitudinal strain.  Unable to assess true diastolic filling parameters because of A. fib.  RV is also mildly reduced and function with borderline elevated RAP.  Both left and right atriae are severely evaluated.  Moderate TR.  Mild aortic valve sclerosis but no  stenosis.  Normal CVA.  Patient Profile     87 y.o. female with known NICM (EF as low as 15-20% with significant improvement followed by most recent EF of 30 to 35%), permanent A. fib (usually rate controlled), LBBB, IBS, GERD, anemia, OA -> seen in ER for evaluation of exacerbation of CHF-several days post right knee repair..  Weight was up 9 pounds-symptoms worsen progressive 3 worsening DOE, edema with reduced urine output from PRN Lasix. Adverse reaction of Lasix is as she gets diarrhea.  Assessment & Plan  Acute on chronic combined systolic/diastolic CHF with roughly 10 pound weight gain. => Notably volume overloaded, had diuresed over 2 and half liters after initial dose of 40 mg IV Lasix  EF is slightly down by echo, I suspect the combination of A. fib and left bundle-branch block as noted with thi along with being volume overloaded => I suspect this probably has to do with perioperative IV fluids.  Had brisk diuresis overnight-at least 3+ liters. => The plan was to treat through today with IV Lasix, and convert to Bumex versus torsemide in the morning. = We need to ensure that she actually has urine output> in response to an oral diuretic and maintains her weights.  Would not be interested to discharge her until we are sure that she tolerates the oral diuretic..  As for diastolic dysfunction, she is on losartan and digoxin (which is also diastolic dysfunction) along with Toprol 25 mg.  Permanent A. fib, rate controlled with digoxin and metoprolol.  On Eliquis for anticoagulation-probably lower dose.  She is on baseline prednisone presumably for IBS.  Will continue. On sertraline, continue home dose.     Dispo: We will need to reevaluate tomorrow and start oral diuretic.  Would monitor during the day following oral diuretic to ensure that she has had urine output, and no adverse effect.  If stable, would potentially consider discharge tomorrow afternoon.  We do however need to  ensure that she has a stable response.    For questions or updates, please contact [facility] Please consult [email or web address] for contact info under        Signed, Bryan Lemma, MD  06/25/2020, 12:51 AM

## 2020-06-25 NOTE — Progress Notes (Signed)
Heart Failure Stewardship Pharmacist Progress Note   PCP: [name], MD PCP-Cardiologist: [name], MD    HPI:  87 yo F with PMH of NICM, afib, LBBB, IBS, GERD, anemia, and OA. She presented to the ED on 06/23/20 for CHF exacerbation following a recent knee surgery. An ECHO was done on 06/24/20 and LVEF was 25-30% (35% in 07/2019; 20-25% in 07/2017; 15% in 04/2017).   Current HF Medications: Torsemide 10 mg daily Metoprolol XL 100 mg daily Losartan 25 mg daily Digoxin 0.125 mg daily  Prior to admission HF Medications: Losartan 25 mg daily Metoprolol XL 100 mg daily Digoxin 0.125 mg daily  Pertinent Lab Values: . Serum creatinine 1.03, BUN 14, Potassium 3.3, Sodium 132, BNP 3972.5, Digoxin (no recent level obtained)   Vital Signs: . Weight: 146 lbs (admission weight: 154 lbs) . Blood pressure: 130/80s  . Heart rate: 70s   Medication Assistance / Insurance Benefits Check: Does the patient have prescription insurance?  Yes Type of insurance plan: UHC Medicare  Does the patient qualify for medication assistance through manufacturers or grants?   Yes . Eligible grants and/or patient assistance programs: [employer] . Medication assistance applications in progress: none  . Medication assistance applications approved: none Approved medication assistance renewals will be completed by: [facility]  Outpatient Pharmacy:  Prior to admission outpatient pharmacy[facility] Is the patient willing to use [facility] TOC pharmacy at discharge? Yes Is the patient willing to transition their outpatient pharmacy to utilize a [facility] outpatient pharmacy?   Pending    Assessment: 1. Acute on chronic systolic CHF (EF 81-82%), due to NICM. NYHA class II symptoms. - Continue torsemide 10 mg daily (note she does not tolerate oral furosemide due to diarrhea) - Continue metoprolol XL 100 mg daily - Continue losartan 25 mg daily. Can consider optimizing to [facility] as outpatient - Consider  starting Farxiga 10 mg daily prior to discharge  - Consider starting spironolactone pending BP and SCr trends   Plan: 1) Medication changes recommended at this time: - Start Farxiga 10 mg daily  2) Patient assistance: Sherryll Burger copay: $47 per month - Farxiga copay: $47 per month - Appeciate assistance from Carolyne Fiscal, CPhT - Can help her enroll in patient assistance programs during [facility] clinic visit   Sharen Hones, PharmD, BCPS Heart Failure Stewardship Pharmacist Phone [phone]

## 2020-06-25 NOTE — Discharge Summary (Addendum)
Has not already is is  Discharge Summary    Patient ID: [name] MRN: [id number]; DOB: Jun 21, 1930  Admit date: 06/23/2020 Discharge date: 06/25/2020  PCP:  [name], MD   [facility]  Cardiologist:  [name], MD   Discharge Diagnoses    Principal Problem:   Acute combined systolic and diastolic CHF, NYHA class 4 (HCC) Active Problems:   Nonischemic cardiomyopathy (HCC)   LBBB (left bundle branch block)   ATRIAL FIBRILLATION    Diagnostic Studies/Procedures    Echo 06/25/2019 1. Left ventricular ejection fraction, by estimation, is 25 to 30%. Left  ventricular ejection fraction by 3D volume is 30 %. Left ventricular  ejection fraction by 2D MOD biplane is 26.8 %. The left ventricle has  severely decreased function. The left  ventricle demonstrates global hypokinesis. Left ventricular diastolic  function could not be evaluated. The average left ventricular global  longitudinal strain is -7.9 %. The global longitudinal strain is abnormal.  2. Right ventricular systolic function is mildly reduced. The right  ventricular size is mildly enlarged. There is mildly elevated pulmonary  artery systolic pressure. The estimated right ventricular systolic  pressure is 38.3 mmHg.  3. Left atrial size was severely dilated.  4. Right atrial size was severely dilated.  5. The mitral valve is grossly normal. Mild mitral valve regurgitation.  No evidence of mitral stenosis.  6. Tricuspid valve regurgitation is moderate.  7. The aortic valve is tricuspid. There is mild calcification of the  aortic valve. There is mild thickening of the aortic valve. Aortic valve  regurgitation is mild. Mild aortic valve sclerosis is present, with no  evidence of aortic valve stenosis.  8. The inferior vena cava is normal in size with greater than 50%  respiratory variability, suggesting right atrial pressure of 3 mmHg.   Comparison(s): Changes from prior  study are noted. LVEF now 25-30%. RV  size and function have improved. RVSP improved from prior.    History of Present Illness     [name] is a 87 y.o. female with chronic combined CHF secondary to ischemic cardiomyopathy, persistent atrial fibrillation, anemia, IBS, left bundle blanch, osteoarthritis and GERD admitted for CHF exacerbation.  Hx of NICM by cath 2010 w/ EF 15-20% >> 50-55% in 2015.  Noted EF of 15% in 2017 in setting of A. fib RVR.  Repeat echo in 2019 with LV function of 20 to 25%. Echo 07/2019 with EF of 35%.   [name] was hospitalized 03/08-03/12/2020 for RIGHT RETINACULAR REPAIR. Wt on admit 158 lbs. Baseline wt 155 lbs.  After she went home from the knee surgery, she developed progressive DOE. She got to the point that she could not get to the bathroom without panting for breath. She describes orthopnea and PND as well. She felt her breathing was labored and she was getting tired very quickly. Gained weight 164lb at home. Came to ER for further evaluation.   Breathing improved with 2.5 L output on lasix dose in ER.     Hospital Course     Consultants: None  1.  Acute on chronic combined CHF -Entered with 10 pound weight gain, orthopnea and PND after recent admission.  Felt due to perioperative IV fluids - BNP almost 4000 - Echo showed LVEF of  25-30% (was 35% in 2021). RV size and function have improved.  -Patient diuresed total 4.5 L -Weight down to 146 LB -Continue Toprol-XL 100 mg daily -Continue losartan 25  mg daily>> patient will be followed in heart failure impact clinic next week and plan to transition to [facility] and addition of spironolactone.  Will need patient assistance program for [place]. --We have started Farxiga 10 mg daily. ->  This is to help with minimizing need for diuretic.  We will also have heart failure navigator to assist with close follow-up to evaluate symptoms, and potentially convert to [facility]. -Allergic to  Lasix causing diarrhea in past -Tolerating torsemide 10 mg daily => with Entresto, the goal would be to potentially add torsemide be more as-needed basis with sliding scale.  She was not previously taking Lasix on a daily basis and was doing well.; this can be discussed in the first outpatient evaluation.  Typical sliding scale will be as follows: Sliding scale Lasix: Weigh yourself when you get home, then Daily in the Morning. Your dry weight will be what your scale says on the day you return home.  If you gain more than 3 pounds from dry weight: Increase the torsemide dosing to 20 mg in (or if not taking it, would be to take an as needed dose) until weight returns to baseline dry weight.  If weight gain is greater than 5 pounds in 2 days: Increase to torsemide (double existing dose).  And contact the office for further assistance if weight does not go down the next day.  If the weight goes down more than 3 pounds from dry weight: Hold Lasix until it returns to baseline dry weight  -Hypokalemia-replacing potassium as we are increasing her diuretic.  2.  Permanent atrial fibrillation  Rate controlled with digoxin and Toprol-dig level checked-digoxin level reduced. -Anticoagulated with Eliquis -No bleeding issue   Did the patient have an acute coronary syndrome (MI, NSTEMI, STEMI, etc) this admission?:  No                               Did the patient have a percutaneous coronary intervention (stent / angioplasty)?:  No.    The patient been seen by Dr. Herbie Baltimore today and deemed ready for discharge home. All follow-up appointments have been scheduled. Discharge medications are listed below.   Discharge Vitals Blood pressure 134/74, pulse 77, temperature 98 F (36.7 C), temperature source Oral, resp. rate 16, height 5\' 5"  (1.651 m), weight 66.5 kg, SpO2 96 %.  Filed Weights   06/24/20 0128 06/25/20 0248  Weight: 69.9 kg 66.5 kg   Physical Exam Constitutional:      Appearance: She is  well-developed.  HENT:     Head: Normocephalic.  Eyes:     Extraocular Movements: Extraocular movements intact.     Pupils: Pupils are equal, round, and reactive to light.  Cardiovascular:     Rate and Rhythm: Normal rate and regular rhythm.  Pulmonary:     Effort: Pulmonary effort is normal.     Breath sounds: Normal breath sounds.  Abdominal:     General: Bowel sounds are normal.     Palpations: Abdomen is soft.  Musculoskeletal:        General: Normal range of motion.     Cervical back: Normal range of motion.  Skin:    General: Skin is warm and dry.  Neurological:     General: No focal deficit present.     Mental Status: She is alert and oriented to person, place, and time.    Labs & Radiologic Studies    CBC  Recent Labs    06/23/20 1239  WBC 8.9  HGB 14.0  HCT 42.7  MCV 93.0  PLT 322   Basic Metabolic Panel Recent Labs    16/01/9602/16/22 0214 06/25/20 0416  NA 135 132*  K 3.5 3.3*  CL 94* 90*  CO2 29 31  GLUCOSE 91 86  BUN 15 14  CREATININE 0.80 1.03*  CALCIUM 8.9 8.8*   Liver Function Tests Recent Labs    06/24/20 0019  AST 27  ALT 19  ALKPHOS 73  BILITOT 1.6*  PROT 5.8*  ALBUMIN 3.1*   High Sensitivity Troponin:   Recent Labs  Lab 06/23/20 1512  TROPONINIHS 12    Hemoglobin A1C Recent Labs    06/24/20 0019  HGBA1C 5.4   Thyroid Function Tests Recent Labs    06/24/20 0019  TSH 4.356   _____________  DG Chest 2 View  Result Date: 06/23/2020 CLINICAL DATA:  Atrial fibrillation.  Nonischemic cardiomyopathy EXAM: CHEST - 2 VIEW COMPARISON:  06/09/2020 FINDINGS: Cardiac enlargement and aortic atherosclerosis. Small left pleural effusion. Similar appearance of diffuse increased reticular interstitial opacities. No superimposed airspace consolidation. IMPRESSION: 1. Cardiac enlargement and mild CHF. 2.  Emphysema (ICD10-J43.9). Electronically Signed   By: Signa Kellaylor  Stroud M.D.   On: 06/23/2020 14:12   DG Chest 2 View  Result Date:  06/10/2020 CLINICAL DATA:  Preoperative respiratory exam for right patellar surgery. EXAM: CHEST - 2 VIEW COMPARISON:  08/06/2019 FINDINGS: The heart is mild-to-moderately enlarged. Diffuse pulmonary interstitial prominence present as well as a small left pleural effusion and potentially trace right pleural effusion. These findings may reflect some degree of chronic heart failure. No focal airspace consolidation, nodule or pneumothorax identified. Visualized bony structures are unremarkable. IMPRESSION: Cardiac enlargement, diffuse pulmonary interstitial prominence as well as small left pleural effusion and potentially trace right pleural effusion. These findings may reflect some degree of chronic heart failure. Electronically Signed   By: Irish LackGlenn  Yamagata M.D.   On: 06/10/2020 09:59   ECHOCARDIOGRAM COMPLETE  Result Date: 06/24/2020    ECHOCARDIOGRAM REPORT   Patient Name:   [name] Date of Exam: 06/24/2020 Medical Rec #:  [id number]       Height:       65.0 in Accession #:    [id number]      Weight:       154.1 lb Date of Birth:  11/26/1930       BSA:          1.771 m Patient Age:    87 years        BP:           131/68 mmHg Patient Gender: F               HR:           67 bpm. Exam Location:  Inpatient Procedure: 2D Echo, 3D Echo, Cardiac Doppler, Color Doppler and Strain Analysis Indications:    I50.40* Unspecified combined systolic (congestive) and diastolic                 (congestive) heart failure  History:        Patient has prior history of Echocardiogram examinations, most                 recent 08/05/2019. Cardiomyopathy and CHF, Abnormal ECG,                 Pulmonary HTN; Arrythmias:Atrial Fibrillation and LBBB. Edema.  Non-ischemic cardiomyopathy.  Sonographer:    Sheralyn Boatman RDCS Referring Phys: [id number] RHONDA G BARRETT  Sonographer Comments: Exam paused for patient care. IMPRESSIONS  1. Left ventricular ejection fraction, by estimation, is 25 to 30%. Left ventricular ejection  fraction by 3D volume is 30 %. Left ventricular ejection fraction by 2D MOD biplane is 26.8 %. The left ventricle has severely decreased function. The left ventricle demonstrates global hypokinesis. Left ventricular diastolic function could not be evaluated. The average left ventricular global longitudinal strain is -7.9 %. The global longitudinal strain is abnormal.  2. Right ventricular systolic function is mildly reduced. The right ventricular size is mildly enlarged. There is mildly elevated pulmonary artery systolic pressure. The estimated right ventricular systolic pressure is 38.3 mmHg.  3. Left atrial size was severely dilated.  4. Right atrial size was severely dilated.  5. The mitral valve is grossly normal. Mild mitral valve regurgitation. No evidence of mitral stenosis.  6. Tricuspid valve regurgitation is moderate.  7. The aortic valve is tricuspid. There is mild calcification of the aortic valve. There is mild thickening of the aortic valve. Aortic valve regurgitation is mild. Mild aortic valve sclerosis is present, with no evidence of aortic valve stenosis.  8. The inferior vena cava is normal in size with greater than 50% respiratory variability, suggesting right atrial pressure of 3 mmHg. Comparison(s): Changes from prior study are noted. LVEF now 25-30%. RV size and function have improved. RVSP improved from prior. FINDINGS  Left Ventricle: Left ventricular ejection fraction, by estimation, is 25 to 30%. Left ventricular ejection fraction by 2D MOD biplane is 26.8 %. Left ventricular ejection fraction by 3D volume is 30 %. The left ventricle has severely decreased function.  The left ventricle demonstrates global hypokinesis. The average left ventricular global longitudinal strain is -7.9 %. The global longitudinal strain is abnormal. 3D left ventricular ejection fraction analysis performed but not reported based on interpreter judgement due to suboptimal quality. The left ventricular internal  cavity size was normal in size. There is no left ventricular hypertrophy. Abnormal (paradoxical) septal motion, consistent with left bundle branch block. Left ventricular diastolic function could not be evaluated due to atrial fibrillation. Left ventricular diastolic function could not be evaluated. Right Ventricle: The right ventricular size is mildly enlarged. No increase in right ventricular wall thickness. Right ventricular systolic function is mildly reduced. There is mildly elevated pulmonary artery systolic pressure. The tricuspid regurgitant  velocity is 2.97 m/s, and with an assumed right atrial pressure of 3 mmHg, the estimated right ventricular systolic pressure is 38.3 mmHg. Left Atrium: Left atrial size was severely dilated. Right Atrium: Right atrial size was severely dilated. Pericardium: Trivial pericardial effusion is present. Mitral Valve: The mitral valve is grossly normal. Mild mitral valve regurgitation. No evidence of mitral valve stenosis. Tricuspid Valve: The tricuspid valve is grossly normal. Tricuspid valve regurgitation is moderate . No evidence of tricuspid stenosis. Aortic Valve: The aortic valve is tricuspid. There is mild calcification of the aortic valve. There is mild thickening of the aortic valve. Aortic valve regurgitation is mild. Mild aortic valve sclerosis is present, with no evidence of aortic valve stenosis. Pulmonic Valve: The pulmonic valve was grossly normal. Pulmonic valve regurgitation is trivial. No evidence of pulmonic stenosis. Aorta: The aortic root and ascending aorta are structurally normal, with no evidence of dilitation. Venous: The inferior vena cava is normal in size with greater than 50% respiratory variability, suggesting right atrial pressure of 3 mmHg. IAS/Shunts: The atrial septum  is grossly normal. Additional Comments: There is a small pleural effusion in the left lateral region.  LEFT VENTRICLE PLAX 2D                        Biplane EF (MOD) LVIDd:          5.30 cm         LV Biplane EF:   Left LVIDs:         4.40 cm                          ventricular LV PW:         1.50 cm                          ejection LV IVS:        1.20 cm                          fraction by LVOT diam:     1.80 cm                          2D MOD LV SV:         43                               biplane is LV SV Index:   24                               26.8 %. LVOT Area:     2.54 cm                                Diastology                                LV e' medial:    3.44 cm/s LV Volumes (MOD)               LV E/e' medial:  30.5 LV vol d, MOD    111.0 ml      LV e' lateral:   8.37 cm/s A2C:                           LV E/e' lateral: 12.5 LV vol d, MOD    94.8 ml A4C:                           2D LV vol s, MOD    79.2 ml       Longitudinal A2C:                           Strain LV vol s, MOD    67.0 ml       2D Strain GLS  -7.9 % A4C:                           Avg: LV SV MOD A2C:   31.8 ml LV SV MOD A4C:   94.8  ml       3D Volume EF LV SV MOD BP:    28.2 ml       LV 3D EF:    Left                                             ventricular                                             ejection                                             fraction by                                             3D volume                                             is 30 %.                                 3D Volume EF:                                3D EF:        30 % RIGHT VENTRICLE         IVC TAPSE (M-mode): 1.8 cm  IVC diam: 1.70 cm LEFT ATRIUM              Index       RIGHT ATRIUM           Index LA diam:        4.70 cm  2.65 cm/m  RA Area:     27.20 cm LA Vol (A2C):   102.0 ml 57.61 ml/m RA Volume:   95.70 ml  54.05 ml/m LA Vol (A4C):   88.0 ml  49.70 ml/m LA Biplane Vol: 95.0 ml  53.65 ml/m  AORTIC VALVE             PULMONIC VALVE LVOT Vmax:   107.00 cm/s PR End Diast Vel: 1.93 msec LVOT Vmean:  75.700 cm/s LVOT VTI:    0.170 m  AORTA Ao Root diam: 3.40 cm MITRAL VALVE                TRICUSPID VALVE MV  Area (PHT): 4.39 cm     TR Peak grad:   35.3 mmHg MV Decel Time: 173 msec     TR Vmax:        297.00 cm/s MV E velocity: 105.00 cm/s MV A velocity: 39.40 cm/s   SHUNTS MV E/A ratio:  2.66         Systemic VTI:  0.17 m  Systemic Diam: 1.80 cm Lennie Odor MD Electronically signed by Lennie Odor MD Signature Date/Time: 06/24/2020/12:47:24 PM    Final    VAS [place] LOWER EXTREMITY VENOUS (DVT) (ONLY [facility] & [facility] 7a-7p)  Result Date: 06/23/2020  Lower Venous DVT Study Indications: Swelling.  Comparison Study: no prior Performing Technologist: Blanch Media RVS  Examination Guidelines: A complete evaluation includes B-mode imaging, spectral Doppler, color Doppler, and power Doppler as needed of all accessible portions of each vessel. Bilateral testing is considered an integral part of a complete examination. Limited examinations for reoccurring indications may be performed as noted. The reflux portion of the exam is performed with the patient in reverse Trendelenburg.  +---------+---------------+---------+-----------+----------+--------------+ RIGHT    CompressibilityPhasicitySpontaneityPropertiesThrombus Aging +---------+---------------+---------+-----------+----------+--------------+ CFV      Full           Yes      Yes                                 +---------+---------------+---------+-----------+----------+--------------+ SFJ      Full                                                        +---------+---------------+---------+-----------+----------+--------------+ FV Prox  Full                                                        +---------+---------------+---------+-----------+----------+--------------+ FV Mid                  Yes      Yes                                 +---------+---------------+---------+-----------+----------+--------------+ FV Distal               Yes      Yes                                  +---------+---------------+---------+-----------+----------+--------------+ PFV      Full                                                        +---------+---------------+---------+-----------+----------+--------------+ POP      Full           Yes      Yes                                 +---------+---------------+---------+-----------+----------+--------------+ PTV      Full                                                        +---------+---------------+---------+-----------+----------+--------------+  PERO     Full                                                        +---------+---------------+---------+-----------+----------+--------------+   +---------+---------------+---------+-----------+----------+--------------+ LEFT     CompressibilityPhasicitySpontaneityPropertiesThrombus Aging +---------+---------------+---------+-----------+----------+--------------+ CFV      Full           Yes      Yes                                 +---------+---------------+---------+-----------+----------+--------------+ SFJ      Full                                                        +---------+---------------+---------+-----------+----------+--------------+ FV Prox  Full                                                        +---------+---------------+---------+-----------+----------+--------------+ FV Mid   Full                                                        +---------+---------------+---------+-----------+----------+--------------+ FV DistalFull                                                        +---------+---------------+---------+-----------+----------+--------------+ PFV      Full                                                        +---------+---------------+---------+-----------+----------+--------------+ POP      Full           Yes      Yes                                  +---------+---------------+---------+-----------+----------+--------------+ PTV      Full                                                        +---------+---------------+---------+-----------+----------+--------------+ PERO     Full                                                        +---------+---------------+---------+-----------+----------+--------------+  Summary: BILATERAL: - No evidence of deep vein thrombosis seen in the lower extremities, bilaterally. - No evidence of superficial venous thrombosis in the lower extremities, bilaterally. -No evidence of popliteal cyst, bilaterally.   *See table(s) above for measurements and observations. Electronically signed by Waverly Ferrari MD on 06/23/2020 at 4:47:05 PM.    Final    Disposition   Pt is being discharged home today in good condition.  Follow-up Plans & Appointments     Follow-up Information    [name], MD. Go on 07/06/2020.   Specialty: Family Medicine Why: @11 :Engineer, maintenance (IT) information: [address] [place] [address] [phone]        [facility]. Go on 06/30/2020.   Specialty: Cardiology Why: AT 11AM. [facility] ([facility] TOC) [facility]. FREE valet parking at [address] ([address]) Please bring all medications and pill organizer/box with you.  Contact information: [address] [id number] FI EPPIRJJOAC [place] [address] [phone]       Ronney Asters, NP. Go on 07/21/2020.   Specialty: Cardiology Why: @10 :45am for hospital. Please arrive at 10 minutes early  Contact information: [address] [place] [address] [phone]              Discharge Instructions    Diet - low sodium heart healthy   Complete by: As directed    Discharge wound care:   Complete by: As directed    As recommended by orthopedic doctor   Increase activity slowly   Complete by: As  directed       Discharge Medications   Allergies as of 06/25/2020      Reactions   Ace Inhibitors Cough   Warfarin Sodium Other (See Comments)   DOSE RELATED PHARMACOLOGIC EFFECT "bleed out"   Famotidine Other (See Comments)   GI upset   Codeine Other (See Comments)   sedation   Delsym [dextromethorphan Polistirex Er] Other (See Comments)   dizziness   Lactose Intolerance (gi) Diarrhea   Lasix [furosemide] Diarrhea   Oral Lasix gives her diarrhea, tolerates IV Rx   Phenylephrine Palpitations, Other (See Comments)   Nasal spray- "likely increase in nasal congestion".    Sulfamethoxazole-trimethoprim Nausea And Vomiting   GI intolerance.      Medication List    STOP taking these medications   mirabegron ER 25 MG Tb24 tablet Commonly known as: Myrbetriq   Myrbetriq 50 MG Tb24 tablet Generic drug: mirabegron ER   tizanidine 2 MG capsule Commonly known as: ZANAFLEX     TAKE these medications   acetaminophen 500 MG tablet Commonly known as: TYLENOL Take 1,000 mg by mouth 2 (two) times daily.   BIOTIN PO Take 1 tablet by mouth daily.   buPROPion 100 MG 12 hr tablet Commonly known as: WELLBUTRIN SR TAKE 1 TABLET (100 MG TOTAL) BY MOUTH IN THE MORNING.   calcium carbonate 500 MG chewable tablet Commonly known as: TUMS - dosed in mg elemental calcium Chew 500 mg by mouth 2 (two) times daily as needed for indigestion or heartburn.   cholecalciferol 25 MCG (1000 UNIT) tablet Commonly known as: VITAMIN D3 Take 1,000 Units by mouth daily.   dapagliflozin propanediol 10 MG Tabs tablet Commonly known as: Farxiga Take 1 tablet (10 mg total) by mouth daily before breakfast.   Digoxin 62.5 MCG Tabs Take 0.0625 mg by mouth daily. What changed:   medication strength  how much to  take   Eliquis 2.5 MG Tabs tablet Generic drug: apixaban TAKE 1 TABLET BY MOUTH TWICE A DAY What changed: how much to take   loperamide 2 MG capsule Commonly known as: IMODIUM Take 2  mg by mouth 3 (three) times daily as needed for diarrhea or loose stools.   losartan 25 MG tablet Commonly known as: COZAAR Take 1 tablet (25 mg total) by mouth daily.   melatonin 5 MG Tabs Take 2.5 mg by mouth at bedtime as needed (sleep).   metoprolol succinate 100 MG 24 hr tablet Commonly known as: TOPROL-XL Take 1 tablet (100 mg total) by mouth daily. Take with or immediately following a meal.   ondansetron 4 MG tablet Commonly known as: Zofran Take 1 tablet (4 mg total) by mouth every 8 (eight) hours as needed for nausea or vomiting.   phenylephrine 1 % nasal spray Commonly known as: NEO-SYNEPHRINE Place 1 drop into both nostrils every 6 (six) hours as needed for congestion.   potassium chloride SA 20 MEQ tablet Commonly known as: Klor-Con M20 Take 2 tablets (40 mEq total) by mouth daily. What changed:   how much to take  when to take this   predniSONE 5 MG tablet Commonly known as: DELTASONE Take 2 tablets (10 mg total) by mouth daily with breakfast. What changed: how much to take   SALONPAS PAIN RELIEF PATCH EX Apply 2 patches topically daily as needed (pain).   sertraline 50 MG tablet Commonly known as: ZOLOFT Take 2 tablets (100 mg total) by mouth daily.   SYSTANE OP Place 1-2 drops into the left eye 3 (three) times daily as needed (dryness).   Systane 0.4-0.3 % Gel ophthalmic gel Generic drug: Polyethyl Glycol-Propyl Glycol Place 1 application into both eyes at bedtime.   torsemide 10 MG tablet Commonly known as: DEMADEX Take 1 tablet by mouth daily. Take additional 1 tablet if 3 pound weight gain overnight or 5 pounds in a week.   traMADol 50 MG tablet Commonly known as: ULTRAM Take 1 tablet (50 mg total) by mouth every 6 (six) hours as needed for moderate pain or severe pain (post op pain).   trolamine salicylate 10 % cream Commonly known as: ASPERCREME Apply 1 application topically at bedtime as needed for muscle pain.             Discharge Care Instructions  (From admission, onward)         Start     Ordered   06/25/20 0000  Discharge wound care:       Comments: As recommended by orthopedic doctor   06/25/20 1242             Outstanding Labs/Studies   BMET at follow up   Duration of Discharge Encounter   Greater than 30 minutes including physician time.  Vonzella Nipple [place], PA 06/25/2020, 12:50 PM  ATTENDING ATTESTATION  I have seen, examined and evaluated the patient this AM along with Mr. Iver Nestle, [place].  After reviewing all the available data and chart, we discussed the patients laboratory, study & physical findings as well as symptoms in detail. I agree with his findings, examination as well as impression recommendations as per our discussion.    Attending adjustments noted in italics.   She looks remarkably better today and on admission.  Significant diuresis.  Multiple different medication changes made including reducing digoxin dose, starting Farxiga, converting from Lasix to torsemide.  Also considering outpatient conversion on AR[facility] to [facility]  She will follow  up with the heart failure navigator nurse and then close follow-up in the clinic.  Bryan Lemma, M.D., M.S. Interventional Cardiologist   Pager # [phone] Phone # [phone] [address] [place], [place] [address]

## 2020-06-25 NOTE — Progress Notes (Signed)
  Mobility Specialist Criteria Algorithm Info.  Mobility Team:  [facility] elevated:Self regulated Activity: Ambulated in hall; Ambulated to bathroom Range of motion: Active; All extremities Level of assistance: Minimal assist, patient does 75% or more Assistive device: Front wheel walker Minutes sitting in chair:  Minutes stood: 5 minutes Minutes ambulated: 5 minutes Distance ambulated (ft): 120 ft Mobility response: Tolerated well Bed Position: Semi-fowlers  Patient calling out for assistance from restroom, received sitting on toilet. Required min A sit<stand from toilet. From there we then ambulated in hallway 120 feet with steady gait using RW. Required standing rest break x1, denied dizziness/lightheadedness and SOB. Upon returning to room she needed minimal assistance to descend onto EOB. Pt reported feeling stronger today than days past and wants to ambulate more with staff when available to do so. Tolerated ambulation well without incident or complaints and is now lying supine in bed with all needs met.  06/25/2020 1:45 PM

## 2020-06-25 NOTE — Evaluation (Signed)
Physical Therapy Evaluation & Discharge Patient Details Name: [name] MRN: [id number] DOB: 14-Mar-1931 Today's Date: 06/25/2020   History of Present Illness  Pt is an 87 y.o. with recent admission after fall on R TKA s/p R kene retinaculum repair (06/16/20), now admitted 06/23/20 with SOB, weight gain, BLE swelling. Workup for acute on chronic CHF. PMH includes CHF, NICM, afib, OA, IBS, LBBB, anemia.    Clinical Impression  Patient evaluated by Physical Therapy with no further acute PT needs identified. PTA, pt has been ambulating with RW and assist from husband since recent R TKA repair (06/16/20); working with [facility]. Today, pt ambulating with RW and intermittent min guard for balance. Pt motivated to participate and perform RLE therex. All education has been completed and the patient has no further questions. Pt preparing to d/c home this afternoon, to continue working with [facility] services. Acute PT is signing off. Thank you for this referral.  HR 91-105 SpO2 96-97% on RA    Follow Up Recommendations Home health PT;Supervision for mobility/OOB    Equipment Recommendations  None recommended by PT    Recommendations for Other Services       Precautions / Restrictions Precautions Precautions: Fall;Knee Precaution Comments: Pt reports instructed to wear R knee immobilizer for 2 wks post-op with limited knee flexion Required Braces or Orthoses: Knee Immobilizer - Right Knee Immobilizer - Right: On when out of bed or walking Restrictions Weight Bearing Restrictions: Yes RLE Weight Bearing: Weight bearing as tolerated      Mobility  Bed Mobility Overal bed mobility: Modified Independent Bed Mobility: Supine to Sit     Supine to sit: Modified independent (Device/Increase time);HOB elevated     General bed mobility comments: HOB slightly elevated    Transfers Overall transfer level: Needs assistance Equipment used: Rolling walker (2 wheeled) Transfers: Sit to/from  Stand Sit to Stand: Supervision         General transfer comment: Multiple sit<>stands from EOB and recliner to RW; supervision for safety; pt with initial poor eccentric control into sitting, repeat trial with cues for technique and stability improved  Ambulation/Gait Ambulation/Gait assistance: Min guard;Supervision Gait Distance (Feet): 150 Feet Assistive device: Rolling walker (2 wheeled) Gait Pattern/deviations: Step-through pattern;Decreased stride length;Decreased weight shift to right;Antalgic Gait velocity: Decreased   General Gait Details: Slow, mostly steady gait with RW and intermittent min guard for balance; gait mechanics limited by R knee immobilizer donned; minA for 1x LOB when pt taking hands off RW to adjust mask  Stairs            Wheelchair Mobility    Modified Rankin (Stroke Patients Only)       Balance Overall balance assessment: Needs assistance   Sitting balance-Leahy Scale: Good       Standing balance-Leahy Scale: Fair Standing balance comment: Can static stand without UE support, unable to accept challenge; static and dynamic stability improved with UE support                             Pertinent Vitals/Pain Pain Assessment: Faces Faces Pain Scale: Hurts a little bit Pain Location: R knee Pain Descriptors / Indicators: Guarding;Discomfort Pain Intervention(s): Monitored during session    Home Living Family/patient expects to be discharged to:: Private residence Living Arrangements: Spouse/significant other Available Help at Discharge: Family;Available 24 hours/day Type of Home: House Home Access: Level entry     Home Layout: Able to live on main level  with bedroom/bathroom;Two level Home Equipment: Walker - 2 wheels;Shower seat;Bedside commode;Cane - single point;Grab bars - tub/shower;Grab bars - toilet      Prior Function Level of Independence: Needs assistance   Gait / Transfers Assistance Needed: Since sx last  week, has been ambulating at home with RW and assist from husband as needed. Working with [facility]  ADL's / Homemaking Assistance Needed: Husband assists with ADL tasks as needed        Hand Dominance        Extremity/Trunk Assessment   Upper Extremity Assessment Upper Extremity Assessment: Overall WFL for tasks assessed    Lower Extremity Assessment Lower Extremity Assessment: RLE deficits/detail RLE Deficits / Details: s/p recent R TKA repair (06/16/20); able to perform SLR with significant quad lag; did not test R knee flexion due to ROM restrictions RLE Coordination: decreased gross motor    Cervical / Trunk Assessment Cervical / Trunk Assessment: Kyphotic  Communication   Communication: No difficulties  Cognition Arousal/Alertness: Awake/alert Behavior During Therapy: WFL for tasks assessed/performed Overall Cognitive Status: Within Functional Limits for tasks assessed                                        General Comments General comments (skin integrity, edema, etc.): SpO2 96-97% on RA, HR 91-105    Exercises Total Joint Exercises Quad Sets: AROM;Right;Seated (5-sec holds) Straight Leg Raises: AROM;Right;Seated (noted quad lag)   Assessment/Plan    PT Assessment All further PT needs can be met in the next venue of care  PT Problem List Decreased strength;Decreased mobility;Decreased activity tolerance;Decreased balance;Decreased knowledge of use of DME;Decreased knowledge of precautions;Decreased range of motion;Pain       PT Treatment Interventions      PT Goals (Current goals can be found in the Care Plan section)  Acute Rehab PT Goals Patient Stated Goal: Home today PT Goal Formulation: All assessment and education complete, DC therapy    Frequency     Barriers to discharge        Co-evaluation               AM-PAC PT "6 Clicks" Mobility  Outcome Measure Help needed turning from your back to your side while in a flat bed  without using bedrails?: None Help needed moving from lying on your back to sitting on the side of a flat bed without using bedrails?: A Little Help needed moving to and from a bed to a chair (including a wheelchair)?: A Little Help needed standing up from a chair using your arms (e.g., wheelchair or bedside chair)?: A Little Help needed to walk in hospital room?: A Little Help needed climbing 3-5 steps with a railing? : A Little 6 Click Score: 19    End of Session Equipment Utilized During Treatment: Gait belt;Right knee immobilizer Activity Tolerance: Patient tolerated treatment well Patient left: in chair;with call bell/phone within reach;with family/visitor present Nurse Communication: Mobility status PT Visit Diagnosis: Other abnormalities of gait and mobility (R26.89)    Time: 1250-1315 PT Time Calculation (min) (ACUTE ONLY): 25 min   Charges:   PT Evaluation $PT Eval Moderate Complexity: 1 Mod PT Treatments $Therapeutic Exercise: 8-22 mins   Mabeline Caras, PT, DPT Acute Rehabilitation Services  Pager [phone] Office [place] 06/25/2020, 1:56 PM

## 2020-06-25 NOTE — Progress Notes (Signed)
  Mobility Specialist Criteria Algorithm Info.  Mobility Team: [facility] elevated:Self regulated Activity: Ambulated in hall; Dangled on edge of bed Range of motion: Active; All extremities Level of assistance: Minimal assist, patient does 75% or more Assistive device: Front wheel walker Minutes sitting in chair:  Minutes stood: 5 minutes Minutes ambulated: 5 minutes Distance ambulated (ft): 100 ft Mobility response: Tolerated well Bed Position: Semi-fowlers  Patient received lying supine in bed, agreed to participate in mobility. Currently wearing a brace on RLE from surgery on knee (06/17/20). Got to the EOB independently and stood with minimal assistance. Upon standing patient needed extra time to steady and gain balance. Ambulated in hallway 100 feet with steady gait. Upon returning to room patient needed min/mod A to descend from standing to sitting due to poor strength and control in LLE. Tolerated ambulation well without incident or complaint and was lying supine in bed with all needs met.    06/25/2020 9:11 AM

## 2020-06-25 NOTE — Progress Notes (Signed)
Heart Failure Nurse Navigator Progress Note  PCP: [name], MD PCP-Cardiologist: Lavell Islam., MD Admission Diagnosis: CHF Admitted from: home with spouse  Presentation:   Olive Bass Hammonds presented with SOB, increased edema. Pt laying comfortably in bed, spouse at bedside. Pleasant couple. State no SDoH, agree upon interview. Weight back to baseline.   ECHO/ LVEF: 25-30%  Clinical Course:  Past Medical History:  Diagnosis Date  . Acute combined systolic and diastolic heart failure (HCC) 05/02/2017  . Anemia   . Anxiety   . Arthritis    "knees" (06/18/2015)  . Atrial fibrillation with RVR (HCC) 06/18/2015   h/o sig bleed on coumadin  . Cardiogenic shock (HCC) 05/02/2017  . Complication of anesthesia 2010   "w/cardiac cath; got into a psychotic state for ~ 3 days; got me out w/Valium"  . Depression    "I have it off and on; not as often as I've gotten older" (06/18/2015)  . Diverticulosis    descending and sigmoid colon--Dr. Jarold Motto  . DJD (degenerative joint disease) of knee    left  . Dyspnea    with exertion and in morning mostly when wakes up   . E coli infection    History of  . GERD (gastroesophageal reflux disease)   . History of Clostridium difficile colitis   . IBS (irritable bowel syndrome)   . Insomnia     off chronic ambien 5mg  as of 10/11, rare/episodic use since then  DCM/CHF  . LBBB (left bundle branch block)   . Lymphocytic colitis 09/27/2016  . Moderate to severe pulmonary hypertension (HCC) 11/26/2019  . Nonischemic cardiomyopathy (HCC)    normal coronaries, EF 15-20% 04/2008, EF 50-55% 2015  . Osteoporosis    on DXA 05/2010  . Positive PPD   . Retroperitoneal bleed   . Sciatica    Right     Social History   Socioeconomic History  . Marital status: Married    Spouse name: 06/2010   . Number of children: 2  . Years of education: 28  . Highest education level: Not on file  Occupational History  . Occupation: Retired    14: RETIRED   Tobacco Use  . Smoking status: Never Smoker  . Smokeless tobacco: Never Used  Vaping Use  . Vaping Use: Never used  Substance and Sexual Activity  . Alcohol use: No    Alcohol/week: 0.0 standard drinks  . Drug use: No  . Sexual activity: Not Currently    Birth control/protection: Post-menopausal, Surgical    Comment: Hysterectomy  Other Topics Concern  . Not on file  Social History Narrative   Retired, former Associate Professor   Married, [employer]   Lives with husband   2 grown children, one in [place], one out of state   Tobacco Use - No.    Drug Use - no   teaches Sunday school, reading   Social Determinants of Health   Financial Resource Strain: Low Risk   . Difficulty of Paying Living Expenses: Not hard at all  Food Insecurity: No Food Insecurity  . Worried About Thursday in the Last Year: Never true  . Ran Out of Food in the Last Year: Never true  Transportation Needs: No Transportation Needs  . Lack of Transportation (Medical): No  . Lack of Transportation (Non-Medical): No  Physical Activity: Inactive  . Days of Exercise per Week: 0 days  . Minutes of Exercise per Session: 0 min  Stress: Not on file  Social Connections: Not on file    High Risk Criteria for Readmission and/or Poor Patient Outcomes:  Heart failure hospital admissions (last 6 months): 1   No Show rate: 2%  Difficult social situation: no  Demonstrates medication adherence: yes  Primary Language: English  Literacy level: able to read/write and comprehend.   Education Assessment and Provision:  Detailed education and instructions provided on heart failure disease management including the following:  Signs and symptoms of Heart Failure When to call the physician Importance of daily weights Low sodium diet Fluid restriction Medication management Anticipated future follow-up appointments  Patient education given on each of the above topics.  Patient acknowledges understanding  via teach back method and acceptance of all instructions.  Education Materials:  "Living Better With Heart Failure" Booklet, HF zone tool, & Daily Weight Tracker Tool.  Patient has scale at home: yes Patient has pill box at home: yes  Barriers of Care:   -none  Considerations/Referrals:   Referral made to Heart Failure Pharmacist Stewardship: yes, to see at [facility] appt Referral made to Heart & Vascular TOC clinic: yes, March 22 @ 11AM. Confirmed transportation.   Items for Follow-up on DC/TOC: -medication optimization, potentially starting farxiga or entresto prior to dc--watching BP with close hospital follow up.  -medication cost for new meds.   Ozella Rocks, RN, BSN Heart Failure Nurse Navigator [phone]

## 2020-06-26 ENCOUNTER — Telehealth: Payer: Self-pay

## 2020-06-26 DIAGNOSIS — I252 Old myocardial infarction: Secondary | ICD-10-CM | POA: Diagnosis not present

## 2020-06-26 DIAGNOSIS — Z96653 Presence of artificial knee joint, bilateral: Secondary | ICD-10-CM | POA: Diagnosis not present

## 2020-06-26 DIAGNOSIS — G4700 Insomnia, unspecified: Secondary | ICD-10-CM | POA: Diagnosis not present

## 2020-06-26 DIAGNOSIS — I110 Hypertensive heart disease with heart failure: Secondary | ICD-10-CM | POA: Diagnosis not present

## 2020-06-26 DIAGNOSIS — F32A Depression, unspecified: Secondary | ICD-10-CM | POA: Diagnosis not present

## 2020-06-26 DIAGNOSIS — I272 Pulmonary hypertension, unspecified: Secondary | ICD-10-CM | POA: Diagnosis not present

## 2020-06-26 DIAGNOSIS — I447 Left bundle-branch block, unspecified: Secondary | ICD-10-CM | POA: Diagnosis not present

## 2020-06-26 DIAGNOSIS — I428 Other cardiomyopathies: Secondary | ICD-10-CM | POA: Diagnosis not present

## 2020-06-26 DIAGNOSIS — Z7901 Long term (current) use of anticoagulants: Secondary | ICD-10-CM | POA: Diagnosis not present

## 2020-06-26 DIAGNOSIS — K5790 Diverticulosis of intestine, part unspecified, without perforation or abscess without bleeding: Secondary | ICD-10-CM | POA: Diagnosis not present

## 2020-06-26 DIAGNOSIS — K219 Gastro-esophageal reflux disease without esophagitis: Secondary | ICD-10-CM | POA: Diagnosis not present

## 2020-06-26 DIAGNOSIS — Z7952 Long term (current) use of systemic steroids: Secondary | ICD-10-CM | POA: Diagnosis not present

## 2020-06-26 DIAGNOSIS — K580 Irritable bowel syndrome with diarrhea: Secondary | ICD-10-CM | POA: Diagnosis not present

## 2020-06-26 DIAGNOSIS — M5431 Sciatica, right side: Secondary | ICD-10-CM | POA: Diagnosis not present

## 2020-06-26 DIAGNOSIS — D649 Anemia, unspecified: Secondary | ICD-10-CM | POA: Diagnosis not present

## 2020-06-26 DIAGNOSIS — I4891 Unspecified atrial fibrillation: Secondary | ICD-10-CM | POA: Diagnosis not present

## 2020-06-26 DIAGNOSIS — I071 Rheumatic tricuspid insufficiency: Secondary | ICD-10-CM | POA: Diagnosis not present

## 2020-06-26 DIAGNOSIS — Z96642 Presence of left artificial hip joint: Secondary | ICD-10-CM | POA: Diagnosis not present

## 2020-06-26 DIAGNOSIS — I5043 Acute on chronic combined systolic (congestive) and diastolic (congestive) heart failure: Secondary | ICD-10-CM | POA: Diagnosis not present

## 2020-06-26 DIAGNOSIS — S83011D Lateral subluxation of right patella, subsequent encounter: Secondary | ICD-10-CM | POA: Diagnosis not present

## 2020-06-26 DIAGNOSIS — M810 Age-related osteoporosis without current pathological fracture: Secondary | ICD-10-CM | POA: Diagnosis not present

## 2020-06-26 NOTE — Telephone Encounter (Signed)
Transition Care Management Follow-up Telephone Call  Date of discharge and from where: 06/25/2020, [facility]  How have you been since you were released from the hospital? Patient is doing better and is resting at home  Any questions or concerns? No  Items Reviewed:  Did the pt receive and understand the discharge instructions provided? Yes   Medications obtained and verified? Yes   Other? No   Any new allergies since your discharge? No   Dietary orders reviewed? Yes  Do you have support at home? Yes   Home Care and Equipment/Supplies: Were home health services ordered? not applicable If so, what is the name of the agency? N/A  Has the agency set up a time to come to the patient's home? not applicable Were any new equipment or medical supplies ordered?  No What is the name of the medical supply agency? N/A Were you able to get the supplies/equipment? not applicable Do you have any questions related to the use of the equipment or supplies? No  Functional Questionnaire: (I = Independent and D = Dependent) ADLs: I  Bathing/Dressing- I  Meal Prep- I  Eating- I  Maintaining continence- I  Transferring/Ambulation- I  Managing Meds- I  Follow up appointments reviewed:   PCP Hospital f/u appt confirmed? Yes  Scheduled to see Dr. Para March on 07/06/2020 @ 11 am.  Specialist Hospital f/u appt confirmed? Yes  Scheduled to see cardiology  Are transportation arrangements needed? No   If their condition worsens, is the pt aware to call PCP or go to the Emergency Dept.? Yes  Was the patient provided with contact information for the PCP's office or ED? Yes  Was to pt encouraged to call back with questions or concerns? Yes

## 2020-06-29 DIAGNOSIS — I5043 Acute on chronic combined systolic (congestive) and diastolic (congestive) heart failure: Secondary | ICD-10-CM | POA: Diagnosis not present

## 2020-06-29 DIAGNOSIS — M810 Age-related osteoporosis without current pathological fracture: Secondary | ICD-10-CM | POA: Diagnosis not present

## 2020-06-29 DIAGNOSIS — Z96653 Presence of artificial knee joint, bilateral: Secondary | ICD-10-CM | POA: Diagnosis not present

## 2020-06-29 DIAGNOSIS — G4700 Insomnia, unspecified: Secondary | ICD-10-CM | POA: Diagnosis not present

## 2020-06-29 DIAGNOSIS — Z7901 Long term (current) use of anticoagulants: Secondary | ICD-10-CM | POA: Diagnosis not present

## 2020-06-29 DIAGNOSIS — M5431 Sciatica, right side: Secondary | ICD-10-CM | POA: Diagnosis not present

## 2020-06-29 DIAGNOSIS — I071 Rheumatic tricuspid insufficiency: Secondary | ICD-10-CM | POA: Diagnosis not present

## 2020-06-29 DIAGNOSIS — I110 Hypertensive heart disease with heart failure: Secondary | ICD-10-CM | POA: Diagnosis not present

## 2020-06-29 DIAGNOSIS — Z96642 Presence of left artificial hip joint: Secondary | ICD-10-CM | POA: Diagnosis not present

## 2020-06-29 DIAGNOSIS — I272 Pulmonary hypertension, unspecified: Secondary | ICD-10-CM | POA: Diagnosis not present

## 2020-06-29 DIAGNOSIS — K219 Gastro-esophageal reflux disease without esophagitis: Secondary | ICD-10-CM | POA: Diagnosis not present

## 2020-06-29 DIAGNOSIS — I428 Other cardiomyopathies: Secondary | ICD-10-CM | POA: Diagnosis not present

## 2020-06-29 DIAGNOSIS — F32A Depression, unspecified: Secondary | ICD-10-CM | POA: Diagnosis not present

## 2020-06-29 DIAGNOSIS — K580 Irritable bowel syndrome with diarrhea: Secondary | ICD-10-CM | POA: Diagnosis not present

## 2020-06-29 DIAGNOSIS — K5790 Diverticulosis of intestine, part unspecified, without perforation or abscess without bleeding: Secondary | ICD-10-CM | POA: Diagnosis not present

## 2020-06-29 DIAGNOSIS — I447 Left bundle-branch block, unspecified: Secondary | ICD-10-CM | POA: Diagnosis not present

## 2020-06-29 DIAGNOSIS — D649 Anemia, unspecified: Secondary | ICD-10-CM | POA: Diagnosis not present

## 2020-06-29 DIAGNOSIS — S83011D Lateral subluxation of right patella, subsequent encounter: Secondary | ICD-10-CM | POA: Diagnosis not present

## 2020-06-29 DIAGNOSIS — Z7952 Long term (current) use of systemic steroids: Secondary | ICD-10-CM | POA: Diagnosis not present

## 2020-06-29 DIAGNOSIS — I252 Old myocardial infarction: Secondary | ICD-10-CM | POA: Diagnosis not present

## 2020-06-29 DIAGNOSIS — I4891 Unspecified atrial fibrillation: Secondary | ICD-10-CM | POA: Diagnosis not present

## 2020-06-30 ENCOUNTER — Other Ambulatory Visit: Payer: Self-pay

## 2020-06-30 ENCOUNTER — Ambulatory Visit (HOSPITAL_COMMUNITY)
Admit: 2020-06-30 | Discharge: 2020-06-30 | Disposition: A | Discharge status: Home / Self Care | Payer: Medicare Other | Attending: Internal Medicine | Admitting: Internal Medicine

## 2020-06-30 ENCOUNTER — Encounter (HOSPITAL_COMMUNITY): Payer: Self-pay

## 2020-06-30 VITALS — BP 104/76 | HR 84 | Wt 146.8 lb

## 2020-06-30 DIAGNOSIS — I5042 Chronic combined systolic (congestive) and diastolic (congestive) heart failure: Secondary | ICD-10-CM | POA: Insufficient documentation

## 2020-06-30 DIAGNOSIS — Z7901 Long term (current) use of anticoagulants: Secondary | ICD-10-CM | POA: Insufficient documentation

## 2020-06-30 DIAGNOSIS — Z7952 Long term (current) use of systemic steroids: Secondary | ICD-10-CM | POA: Diagnosis not present

## 2020-06-30 DIAGNOSIS — Z79899 Other long term (current) drug therapy: Secondary | ICD-10-CM | POA: Insufficient documentation

## 2020-06-30 DIAGNOSIS — I428 Other cardiomyopathies: Secondary | ICD-10-CM | POA: Diagnosis not present

## 2020-06-30 DIAGNOSIS — I4821 Permanent atrial fibrillation: Secondary | ICD-10-CM | POA: Insufficient documentation

## 2020-06-30 DIAGNOSIS — I447 Left bundle-branch block, unspecified: Secondary | ICD-10-CM | POA: Diagnosis not present

## 2020-06-30 LAB — BASIC METABOLIC PANEL
Anion gap: 9 (ref 5–15)
BUN: 23 mg/dL (ref 8–23)
CO2: 31 mmol/L (ref 22–32)
Calcium: 9.7 mg/dL (ref 8.9–10.3)
Chloride: 92 mmol/L — ABNORMAL LOW (ref 98–111)
Creatinine, Ser: 0.85 mg/dL (ref 0.44–1.00)
GFR, Estimated: >60 mL/min (ref >60)
Glucose, Bld: 117 mg/dL — ABNORMAL HIGH (ref 70–99)
Potassium: 4.2 mmol/L (ref 3.5–5.1)
Sodium: 132 mmol/L — ABNORMAL LOW (ref 135–145)

## 2020-06-30 MED ORDER — TORSEMIDE 20 MG PO TABS: 20.0000 mg | ORAL_TABLET | Freq: Every day | ORAL | 5 refills | Status: DC | PRN

## 2020-06-30 NOTE — Progress Notes (Addendum)
[facility]  PCP: [name] Primary Cardiologist: [name]  HPI:  [name] is a 87 y.o.  female  with a PMH significant for   [name] is a 87 y.o. female with chronic combined CHF secondary to non ischemic cardiomyopathy, persistent atrial fibrillation, anemia, IBS, left bundle branch block, osteoarthritis and GERD   Hx of NICM by cath 2010 w/ EF 15-20% >> 50-55% in 2015.  Noted EF of 15% in 2017 in setting of A. fib RVR.  Repeat echo in 2019 with LV function of 20 to 25%. Echo 07/2019 with EF of 35%.   [name] hospitalized 03/08-03/12/2020 forRIGHT RETINACULAR REPAIR. Wt on admit 158 lbs. Baseline wt 155 lbs.  After she went home from the knee surgery, she developed progressive DOE. She got to the point that she could not get to the bathroom without panting for breath. She describes orthopnea and PND as well. She felt her breathing was labored and she was getting tired very quickly. Gained weight 164lb at home. Came to ER for further evaluation.   Since hospital discharge weight down an additional 4 lbs to 140 by her home scale weight here 146lbs with clothes and knee stabilization device.  Denies dyspnea or chest pain, her mobility is quite limited right now so not exerting herself very vigorously.    ROS: All systems negative except as listed in HPI, PMH and Problem List.  SH:  Social History   Socioeconomic History  . Marital status: Married    Spouse name: [name]   . Number of children: 2  . Years of education: 64  . Highest education level: Not on file  Occupational History  . Occupation: Retired    Associate Professor: RETIRED  Tobacco Use  . Smoking status: Never Smoker  . Smokeless tobacco: Never Used  Vaping Use  . Vaping Use: Never used  Substance and Sexual Activity  . Alcohol use: No    Alcohol/week: 0.0 standard drinks  . Drug use: No  . Sexual activity: Not Currently    Birth  control/protection: Post-menopausal, Surgical    Comment: Hysterectomy  Other Topics Concern  . Not on file  Social History Narrative   Retired, former [employer]   Married, Rushie Goltz   Lives with husband   2 grown children, one in [place], one out of state   Tobacco Use - No.    Drug Use - no   teaches Sunday school, reading   Social Determinants of Health   Financial Resource Strain: Low Risk   . Difficulty of Paying Living Expenses: Not hard at all  Food Insecurity: No Food Insecurity  . Worried About Programme researcher, broadcasting/film/video in the Last Year: Never true  . Ran Out of Food in the Last Year: Never true  Transportation Needs: No Transportation Needs  . Lack of Transportation (Medical): No  . Lack of Transportation (Non-Medical): No  Physical Activity: Inactive  . Days of Exercise per Week: 0 days  . Minutes of Exercise per Session: 0 min  Stress: Not on file  Social Connections: Not on file  Intimate Partner Violence: Not At Risk  . Fear of Current or Ex-Partner: No  . Emotionally Abused: No  . Physically Abused: No  . Sexually Abused: No    FH:  Family History  Problem Relation Age of Onset  . Colon cancer Father        possible colon cancer  . Other Father  esophagus tumor?  . Tuberculosis Mother   . Breast cancer Neg Hx   . Stomach cancer Neg Hx   . Pancreatic cancer Neg Hx     Past Medical History:  Diagnosis Date  . Acute combined systolic and diastolic heart failure (HCC) 05/02/2017  . Anemia   . Anxiety   . Arthritis    "knees" (06/18/2015)  . Atrial fibrillation with RVR (HCC) 06/18/2015   h/o sig bleed on coumadin  . Cardiogenic shock (HCC) 05/02/2017  . Complication of anesthesia 2010   "w/cardiac cath; got into a psychotic state for ~ 3 days; got me out w/Valium"  . Depression    "I have it off and on; not as often as I've gotten older" (06/18/2015)  . Diverticulosis    descending and sigmoid colon--Dr. Jarold Motto  . DJD (degenerative joint  disease) of knee    left  . Dyspnea    with exertion and in morning mostly when wakes up   . E coli infection    History of  . GERD (gastroesophageal reflux disease)   . History of Clostridium difficile colitis   . IBS (irritable bowel syndrome)   . Insomnia     off chronic ambien 5mg  as of 10/11, rare/episodic use since then  DCM/CHF  . LBBB (left bundle branch block)   . Lymphocytic colitis 09/27/2016  . Moderate to severe pulmonary hypertension (HCC) 11/26/2019  . Nonischemic cardiomyopathy (HCC)    normal coronaries, EF 15-20% 04/2008, EF 50-55% 2015  . Osteoporosis    on DXA 05/2010  . Positive PPD   . Retroperitoneal bleed   . Sciatica    Right    Current Outpatient Medications  Medication Sig Dispense Refill  . acetaminophen (TYLENOL) 500 MG tablet Take 1,000 mg by mouth 2 (two) times daily.    06/2010 BIOTIN PO Take 1 tablet by mouth daily.    . calcium carbonate (TUMS - DOSED IN MG ELEMENTAL CALCIUM) 500 MG chewable tablet Chew 500 mg by mouth 2 (two) times daily as needed for indigestion or heartburn.    . cholecalciferol (VITAMIN D3) 25 MCG (1000 UNIT) tablet Take 1,000 Units by mouth daily.    . dapagliflozin propanediol (FARXIGA) 10 MG TABS tablet Take 1 tablet (10 mg total) by mouth daily before breakfast. 30 tablet 6  . digoxin 62.5 MCG TABS Take 0.0625 mg by mouth daily. 30 tablet 6  . ELIQUIS 2.5 MG TABS tablet TAKE 1 TABLET BY MOUTH TWICE A DAY (Patient taking differently: Take 2.5 mg by mouth 2 (two) times daily.) 180 tablet 1  . loperamide (IMODIUM) 2 MG capsule Take 2 mg by mouth 3 (three) times daily as needed for diarrhea or loose stools.     [name] losartan (COZAAR) 25 MG tablet Take 1 tablet (25 mg total) by mouth daily. 90 tablet 3  . melatonin 5 MG TABS Take 2.5 mg by mouth at bedtime as needed (sleep).    . Menthol-Methyl Salicylate (SALONPAS PAIN RELIEF PATCH EX) Apply 2 patches topically daily as needed (pain).    . metoprolol succinate (TOPROL-XL) 100 MG 24 hr  tablet Take 1 tablet (100 mg total) by mouth daily. Take with or immediately following a meal. 90 tablet 3  . ondansetron (ZOFRAN) 4 MG tablet Take 1 tablet (4 mg total) by mouth every 8 (eight) hours as needed for nausea or vomiting. 20 tablet 0  . phenylephrine (NEO-SYNEPHRINE) 1 % nasal spray Place 1 drop into both nostrils every 6 (six)  hours as needed for congestion.    Bertram Gala Glycol-Propyl Glycol (SYSTANE OP) Place 1-2 drops into the left eye 3 (three) times daily as needed (dryness).    Bertram Gala Glycol-Propyl Glycol (SYSTANE) 0.4-0.3 % GEL ophthalmic gel Place 1 application into both eyes at bedtime.    . potassium chloride SA (KLOR-CON M20) 20 MEQ tablet Take 2 tablets (40 mEq total) by mouth daily. 60 tablet 3  . predniSONE (DELTASONE) 5 MG tablet Take 2 tablets (10 mg total) by mouth daily with breakfast. (Patient taking differently: Take 5 mg by mouth daily with breakfast.) 60 tablet 1  . sertraline (ZOLOFT) 50 MG tablet Take 2 tablets (100 mg total) by mouth daily.    [name] torsemide (DEMADEX) 10 MG tablet Take 1 tablet by mouth daily. Take additional 1 tablet if 3 pound weight gain overnight or 5 pounds in a week. 30 tablet 3  . traMADol (ULTRAM) 50 MG tablet Take 1 tablet (50 mg total) by mouth every 6 (six) hours as needed for moderate pain or severe pain (post op pain). 30 tablet 0  . trolamine salicylate (ASPERCREME) 10 % cream Apply 1 application topically at bedtime as needed for muscle pain.     No current facility-administered medications for this encounter.    Vitals:   06/30/20 1124  BP: 104/76  Pulse: 84  SpO2: 97%  Weight: 66.6 kg (146 lb 12.8 oz)    PHYSICAL EXAM: Cardiac: JVD flat, normal rate but with irregularly irregular rhythm, clear s1 and s2, 2/6 TR, Trace LE edema Pulmonary: CTAB, not in distress Abdominal: non distended abdomen, soft and nontender Psych: Alert, conversant, in good spirits   ASSESSMENT & PLAN: Chronic Combined Systolic and  Diastolic CHF, NICM:  -Hx of NICM by cath 2010 w/ EF 15-20% -> 50-55% in 2015 -EF of 15% in 2017 in setting of A. fib RVR -2019 with LV function of 20 to 25%. Echo 07/2019 with EF of 35% -06/2020 EF 20-25% but in the setting of afib RVR, also mildly reduced RV and elevated RVSP to 38, moderate TR -NYHA Class II-III symptoms (difficult to assess currently with limited mobility), volume status euvolemic to slightly dry -On GDMT with Toprol XL 25, Losartan 25, Farxiga 10 -BP low and she appears slightly dry on exam, will switch Torsemide to PRN not the right time for further addition of GDMT -repeat BMP today -would benefit from sleep study rule out osa  Permanent Afib:  -CHADS2VASC 5 -continue eliquis at reduced dose (due to spontaneous retroperitoneal bleed while on warfarin) -rate controlled with digoxin and Toprol XL -took digoxin this am, her dose recently reduced.  She will need level at next visit 07/21/20 she is instructed to not take her dig that morning -would benefit from sleep study rule out osa   Follow up w/general cardiology

## 2020-06-30 NOTE — Progress Notes (Signed)
[facility] Heart Failure Pharmacist Encounter  Initiated [employer] patient assistance application for [place]. Patient portion has been completed and signed. Faxed forms to Dr. Leonides Sake office at [facility] office to sign provider portion. Once completed, [facility] office will fax completed forms to [phone]. [employer] phone number for follow up is [phone].   Patient was discharged on reduced digoxin dose. She has already taken her dose this morning. Requested for patient to not take her digoxin the morning of her next appointment with cardiology on 07/21/20 so they can check her digoxin level at that visit.  Patient and husband verbalized understanding.  Sharen Hones, PharmD, BCPS Heart Failure Transitions of Care Clinic Pharmacist [phone]

## 2020-06-30 NOTE — Patient Instructions (Signed)
Labs done today. We will contact you only if your labs are abnormal.  START Torsemide 20mg  (1 tablet) by mouth only as needed.   No other medication changes were made. Please continue all current medications as prescribed.

## 2020-07-01 DIAGNOSIS — G4700 Insomnia, unspecified: Secondary | ICD-10-CM | POA: Diagnosis not present

## 2020-07-01 DIAGNOSIS — M810 Age-related osteoporosis without current pathological fracture: Secondary | ICD-10-CM | POA: Diagnosis not present

## 2020-07-01 DIAGNOSIS — F32A Depression, unspecified: Secondary | ICD-10-CM | POA: Diagnosis not present

## 2020-07-01 DIAGNOSIS — K219 Gastro-esophageal reflux disease without esophagitis: Secondary | ICD-10-CM | POA: Diagnosis not present

## 2020-07-01 DIAGNOSIS — I4891 Unspecified atrial fibrillation: Secondary | ICD-10-CM | POA: Diagnosis not present

## 2020-07-01 DIAGNOSIS — I5043 Acute on chronic combined systolic (congestive) and diastolic (congestive) heart failure: Secondary | ICD-10-CM | POA: Diagnosis not present

## 2020-07-01 DIAGNOSIS — Z96642 Presence of left artificial hip joint: Secondary | ICD-10-CM | POA: Diagnosis not present

## 2020-07-01 DIAGNOSIS — D649 Anemia, unspecified: Secondary | ICD-10-CM | POA: Diagnosis not present

## 2020-07-01 DIAGNOSIS — S83011D Lateral subluxation of right patella, subsequent encounter: Secondary | ICD-10-CM | POA: Diagnosis not present

## 2020-07-01 DIAGNOSIS — I110 Hypertensive heart disease with heart failure: Secondary | ICD-10-CM | POA: Diagnosis not present

## 2020-07-01 DIAGNOSIS — I428 Other cardiomyopathies: Secondary | ICD-10-CM | POA: Diagnosis not present

## 2020-07-01 DIAGNOSIS — I447 Left bundle-branch block, unspecified: Secondary | ICD-10-CM | POA: Diagnosis not present

## 2020-07-01 DIAGNOSIS — Z9889 Other specified postprocedural states: Secondary | ICD-10-CM | POA: Diagnosis not present

## 2020-07-01 DIAGNOSIS — M25561 Pain in right knee: Secondary | ICD-10-CM | POA: Diagnosis not present

## 2020-07-01 DIAGNOSIS — Z7952 Long term (current) use of systemic steroids: Secondary | ICD-10-CM | POA: Diagnosis not present

## 2020-07-01 DIAGNOSIS — Z96653 Presence of artificial knee joint, bilateral: Secondary | ICD-10-CM | POA: Diagnosis not present

## 2020-07-01 DIAGNOSIS — K580 Irritable bowel syndrome with diarrhea: Secondary | ICD-10-CM | POA: Diagnosis not present

## 2020-07-01 DIAGNOSIS — Z7901 Long term (current) use of anticoagulants: Secondary | ICD-10-CM | POA: Diagnosis not present

## 2020-07-01 DIAGNOSIS — I272 Pulmonary hypertension, unspecified: Secondary | ICD-10-CM | POA: Diagnosis not present

## 2020-07-01 DIAGNOSIS — K5790 Diverticulosis of intestine, part unspecified, without perforation or abscess without bleeding: Secondary | ICD-10-CM | POA: Diagnosis not present

## 2020-07-01 DIAGNOSIS — I071 Rheumatic tricuspid insufficiency: Secondary | ICD-10-CM | POA: Diagnosis not present

## 2020-07-01 DIAGNOSIS — I252 Old myocardial infarction: Secondary | ICD-10-CM | POA: Diagnosis not present

## 2020-07-01 DIAGNOSIS — M5431 Sciatica, right side: Secondary | ICD-10-CM | POA: Diagnosis not present

## 2020-07-02 DIAGNOSIS — I5043 Acute on chronic combined systolic (congestive) and diastolic (congestive) heart failure: Secondary | ICD-10-CM | POA: Diagnosis not present

## 2020-07-02 DIAGNOSIS — M810 Age-related osteoporosis without current pathological fracture: Secondary | ICD-10-CM | POA: Diagnosis not present

## 2020-07-02 DIAGNOSIS — I071 Rheumatic tricuspid insufficiency: Secondary | ICD-10-CM | POA: Diagnosis not present

## 2020-07-02 DIAGNOSIS — Z96653 Presence of artificial knee joint, bilateral: Secondary | ICD-10-CM | POA: Diagnosis not present

## 2020-07-02 DIAGNOSIS — Z96642 Presence of left artificial hip joint: Secondary | ICD-10-CM | POA: Diagnosis not present

## 2020-07-02 DIAGNOSIS — K5790 Diverticulosis of intestine, part unspecified, without perforation or abscess without bleeding: Secondary | ICD-10-CM | POA: Diagnosis not present

## 2020-07-02 DIAGNOSIS — F32A Depression, unspecified: Secondary | ICD-10-CM | POA: Diagnosis not present

## 2020-07-02 DIAGNOSIS — K580 Irritable bowel syndrome with diarrhea: Secondary | ICD-10-CM | POA: Diagnosis not present

## 2020-07-02 DIAGNOSIS — I447 Left bundle-branch block, unspecified: Secondary | ICD-10-CM | POA: Diagnosis not present

## 2020-07-02 DIAGNOSIS — S83011D Lateral subluxation of right patella, subsequent encounter: Secondary | ICD-10-CM | POA: Diagnosis not present

## 2020-07-02 DIAGNOSIS — I428 Other cardiomyopathies: Secondary | ICD-10-CM | POA: Diagnosis not present

## 2020-07-02 DIAGNOSIS — M5431 Sciatica, right side: Secondary | ICD-10-CM | POA: Diagnosis not present

## 2020-07-02 DIAGNOSIS — Z7901 Long term (current) use of anticoagulants: Secondary | ICD-10-CM | POA: Diagnosis not present

## 2020-07-02 DIAGNOSIS — I272 Pulmonary hypertension, unspecified: Secondary | ICD-10-CM | POA: Diagnosis not present

## 2020-07-02 DIAGNOSIS — I252 Old myocardial infarction: Secondary | ICD-10-CM | POA: Diagnosis not present

## 2020-07-02 DIAGNOSIS — D649 Anemia, unspecified: Secondary | ICD-10-CM | POA: Diagnosis not present

## 2020-07-02 DIAGNOSIS — Z7952 Long term (current) use of systemic steroids: Secondary | ICD-10-CM | POA: Diagnosis not present

## 2020-07-02 DIAGNOSIS — G4700 Insomnia, unspecified: Secondary | ICD-10-CM | POA: Diagnosis not present

## 2020-07-02 DIAGNOSIS — K219 Gastro-esophageal reflux disease without esophagitis: Secondary | ICD-10-CM | POA: Diagnosis not present

## 2020-07-02 DIAGNOSIS — I110 Hypertensive heart disease with heart failure: Secondary | ICD-10-CM | POA: Diagnosis not present

## 2020-07-02 DIAGNOSIS — I4891 Unspecified atrial fibrillation: Secondary | ICD-10-CM | POA: Diagnosis not present

## 2020-07-03 DIAGNOSIS — H35373 Puckering of macula, bilateral: Secondary | ICD-10-CM | POA: Diagnosis not present

## 2020-07-03 DIAGNOSIS — H524 Presbyopia: Secondary | ICD-10-CM | POA: Diagnosis not present

## 2020-07-03 DIAGNOSIS — H1789 Other corneal scars and opacities: Secondary | ICD-10-CM | POA: Diagnosis not present

## 2020-07-03 DIAGNOSIS — H52203 Unspecified astigmatism, bilateral: Secondary | ICD-10-CM | POA: Diagnosis not present

## 2020-07-06 ENCOUNTER — Encounter: Payer: Self-pay | Admitting: Family Medicine

## 2020-07-06 ENCOUNTER — Other Ambulatory Visit: Payer: Self-pay

## 2020-07-06 ENCOUNTER — Ambulatory Visit (INDEPENDENT_AMBULATORY_CARE_PROVIDER_SITE_OTHER): Payer: Medicare Other | Admitting: Family Medicine

## 2020-07-06 DIAGNOSIS — I5041 Acute combined systolic (congestive) and diastolic (congestive) heart failure: Secondary | ICD-10-CM

## 2020-07-06 MED ORDER — PREDNISONE 5 MG PO TABS: 5.0000 mg | ORAL_TABLET | Freq: Every day | ORAL | Status: DC

## 2020-07-06 NOTE — Patient Instructions (Signed)
Don't change your meds for now.  Continue as is and let me update cardiology.  We may need to taper some of your meds but I want cardiology input first.  Careful swallowing and raise the head of your be a few inches.   Take care.  Glad to see you.

## 2020-07-06 NOTE — Progress Notes (Signed)
This visit occurred during the SARS-CoV-2 public health emergency.  Safety protocols were in place, including screening questions prior to the visit, additional usage of staff PPE, and extensive cleaning of exam room while observing appropriate contact time as indicated for disinfecting solutions.  Inpatient f/u for CHF.  S/p sig diuresis.  D/w pt.  AM weight today 138.    We talked about farxiga use/rationale.  Last torsemide use on 07/01/20.  No sig weight change in the last few days.  She is less SOB.  Less BLE edema.  She is still fatigued.    She still has a cough.  That started prior to inpatient stay.  Voice is still hoarse.  More cough in the AM.  Dry cough.  No fevers.  She can cough with eating/drinking.  We talked about speech eval, she wanted to defer for now.  We talked about HOB elevation, she'll work on that.  We talked about posture and swallowing effectively.    We talked about OSA eval.  She doesn't have known snoring and doesn't wake gasping for air.  She wanted to defer OSA eval at this point.  No CP.    Meds, vitals, and allergies reviewed.   ROS: Per HPI unless specifically indicated in ROS section   nad ncat Neck supple no LA ncat IRR, not tachy ctab No BLE edema.  R knee in brace.   Skin well perfused.   35 minutes were devoted to patient care in this encounter (this includes time spent reviewing the patient's file/history, interviewing and examining the patient, counseling/reviewing plan with patient).

## 2020-07-07 DIAGNOSIS — K5790 Diverticulosis of intestine, part unspecified, without perforation or abscess without bleeding: Secondary | ICD-10-CM | POA: Diagnosis not present

## 2020-07-07 DIAGNOSIS — I252 Old myocardial infarction: Secondary | ICD-10-CM | POA: Diagnosis not present

## 2020-07-07 DIAGNOSIS — K219 Gastro-esophageal reflux disease without esophagitis: Secondary | ICD-10-CM | POA: Diagnosis not present

## 2020-07-07 DIAGNOSIS — Z7952 Long term (current) use of systemic steroids: Secondary | ICD-10-CM | POA: Diagnosis not present

## 2020-07-07 DIAGNOSIS — I447 Left bundle-branch block, unspecified: Secondary | ICD-10-CM | POA: Diagnosis not present

## 2020-07-07 DIAGNOSIS — I272 Pulmonary hypertension, unspecified: Secondary | ICD-10-CM | POA: Diagnosis not present

## 2020-07-07 DIAGNOSIS — I110 Hypertensive heart disease with heart failure: Secondary | ICD-10-CM | POA: Diagnosis not present

## 2020-07-07 DIAGNOSIS — Z96653 Presence of artificial knee joint, bilateral: Secondary | ICD-10-CM | POA: Diagnosis not present

## 2020-07-07 DIAGNOSIS — I071 Rheumatic tricuspid insufficiency: Secondary | ICD-10-CM | POA: Diagnosis not present

## 2020-07-07 DIAGNOSIS — Z7901 Long term (current) use of anticoagulants: Secondary | ICD-10-CM | POA: Diagnosis not present

## 2020-07-07 DIAGNOSIS — M810 Age-related osteoporosis without current pathological fracture: Secondary | ICD-10-CM | POA: Diagnosis not present

## 2020-07-07 DIAGNOSIS — I5043 Acute on chronic combined systolic (congestive) and diastolic (congestive) heart failure: Secondary | ICD-10-CM | POA: Diagnosis not present

## 2020-07-07 DIAGNOSIS — Z96642 Presence of left artificial hip joint: Secondary | ICD-10-CM | POA: Diagnosis not present

## 2020-07-07 DIAGNOSIS — M5431 Sciatica, right side: Secondary | ICD-10-CM | POA: Diagnosis not present

## 2020-07-07 DIAGNOSIS — S83011D Lateral subluxation of right patella, subsequent encounter: Secondary | ICD-10-CM | POA: Diagnosis not present

## 2020-07-07 DIAGNOSIS — I4891 Unspecified atrial fibrillation: Secondary | ICD-10-CM | POA: Diagnosis not present

## 2020-07-07 DIAGNOSIS — K580 Irritable bowel syndrome with diarrhea: Secondary | ICD-10-CM | POA: Diagnosis not present

## 2020-07-07 DIAGNOSIS — G4700 Insomnia, unspecified: Secondary | ICD-10-CM | POA: Diagnosis not present

## 2020-07-07 DIAGNOSIS — D649 Anemia, unspecified: Secondary | ICD-10-CM | POA: Diagnosis not present

## 2020-07-07 DIAGNOSIS — F32A Depression, unspecified: Secondary | ICD-10-CM | POA: Diagnosis not present

## 2020-07-07 DIAGNOSIS — I428 Other cardiomyopathies: Secondary | ICD-10-CM | POA: Diagnosis not present

## 2020-07-09 ENCOUNTER — Telehealth: Payer: Self-pay | Admitting: Cardiovascular Disease

## 2020-07-09 DIAGNOSIS — Z96642 Presence of left artificial hip joint: Secondary | ICD-10-CM | POA: Diagnosis not present

## 2020-07-09 DIAGNOSIS — Z96653 Presence of artificial knee joint, bilateral: Secondary | ICD-10-CM | POA: Diagnosis not present

## 2020-07-09 DIAGNOSIS — Z7952 Long term (current) use of systemic steroids: Secondary | ICD-10-CM | POA: Diagnosis not present

## 2020-07-09 DIAGNOSIS — S83011D Lateral subluxation of right patella, subsequent encounter: Secondary | ICD-10-CM | POA: Diagnosis not present

## 2020-07-09 DIAGNOSIS — D649 Anemia, unspecified: Secondary | ICD-10-CM | POA: Diagnosis not present

## 2020-07-09 DIAGNOSIS — I5043 Acute on chronic combined systolic (congestive) and diastolic (congestive) heart failure: Secondary | ICD-10-CM | POA: Diagnosis not present

## 2020-07-09 DIAGNOSIS — I252 Old myocardial infarction: Secondary | ICD-10-CM | POA: Diagnosis not present

## 2020-07-09 DIAGNOSIS — I4891 Unspecified atrial fibrillation: Secondary | ICD-10-CM | POA: Diagnosis not present

## 2020-07-09 DIAGNOSIS — M5431 Sciatica, right side: Secondary | ICD-10-CM | POA: Diagnosis not present

## 2020-07-09 DIAGNOSIS — I110 Hypertensive heart disease with heart failure: Secondary | ICD-10-CM | POA: Diagnosis not present

## 2020-07-09 DIAGNOSIS — F32A Depression, unspecified: Secondary | ICD-10-CM | POA: Diagnosis not present

## 2020-07-09 DIAGNOSIS — I071 Rheumatic tricuspid insufficiency: Secondary | ICD-10-CM | POA: Diagnosis not present

## 2020-07-09 DIAGNOSIS — K5790 Diverticulosis of intestine, part unspecified, without perforation or abscess without bleeding: Secondary | ICD-10-CM | POA: Diagnosis not present

## 2020-07-09 DIAGNOSIS — I447 Left bundle-branch block, unspecified: Secondary | ICD-10-CM | POA: Diagnosis not present

## 2020-07-09 DIAGNOSIS — Z7901 Long term (current) use of anticoagulants: Secondary | ICD-10-CM | POA: Diagnosis not present

## 2020-07-09 DIAGNOSIS — G4700 Insomnia, unspecified: Secondary | ICD-10-CM | POA: Diagnosis not present

## 2020-07-09 DIAGNOSIS — K219 Gastro-esophageal reflux disease without esophagitis: Secondary | ICD-10-CM | POA: Diagnosis not present

## 2020-07-09 DIAGNOSIS — M810 Age-related osteoporosis without current pathological fracture: Secondary | ICD-10-CM | POA: Diagnosis not present

## 2020-07-09 DIAGNOSIS — I428 Other cardiomyopathies: Secondary | ICD-10-CM | POA: Diagnosis not present

## 2020-07-09 DIAGNOSIS — K580 Irritable bowel syndrome with diarrhea: Secondary | ICD-10-CM | POA: Diagnosis not present

## 2020-07-09 DIAGNOSIS — I272 Pulmonary hypertension, unspecified: Secondary | ICD-10-CM | POA: Diagnosis not present

## 2020-07-09 NOTE — Telephone Encounter (Signed)
Spoke with Aundra Millet from pharmacy, advised her that Dr. Leonides Sake nurse is off today but that I would forward this message to her to check on status of patient assistance paperwork. Megan verbalized understanding.

## 2020-07-09 NOTE — Telephone Encounter (Signed)
   Pt c/o medication issue:  1. Name of Medication:   dapagliflozin propanediol (FARXIGA) 10 MG TABS tablet   2. How are you currently taking this medication (dosage and times per day)? Take 1 tablet (10 mg total) by mouth daily before breakfast.  3. Are you having a reaction (difficulty breathing--STAT)?  4. What is your medication issue?  Megan Pharmacist with [facility], she wanted to check in if Dr. Duke Salvia received pt assistance for [facility], papers needs to be sign by Dr. Duke Salvia. She said she faxed it last 06/30/20

## 2020-07-09 NOTE — Assessment & Plan Note (Signed)
Clearly improved s/p diuresis but still with fatigue.  Wouldn't change meds at this point but will ask cardiology about posible dec losartan and farxiga to help with low BP/fatigue.  Okay for outpatient f/u.  Patient agrees with plan.  Routine cautions given to patient.  If SOB or more BLE edema or weight gain, then she'll update us/seek eval.

## 2020-07-10 NOTE — Telephone Encounter (Signed)
Forms faxed today, left message to call back

## 2020-07-11 ENCOUNTER — Other Ambulatory Visit: Payer: Self-pay | Admitting: Cardiovascular Disease

## 2020-07-11 DIAGNOSIS — I4891 Unspecified atrial fibrillation: Secondary | ICD-10-CM

## 2020-07-13 DIAGNOSIS — I272 Pulmonary hypertension, unspecified: Secondary | ICD-10-CM | POA: Diagnosis not present

## 2020-07-13 DIAGNOSIS — M5431 Sciatica, right side: Secondary | ICD-10-CM | POA: Diagnosis not present

## 2020-07-13 DIAGNOSIS — I428 Other cardiomyopathies: Secondary | ICD-10-CM | POA: Diagnosis not present

## 2020-07-13 DIAGNOSIS — F32A Depression, unspecified: Secondary | ICD-10-CM | POA: Diagnosis not present

## 2020-07-13 DIAGNOSIS — Z7901 Long term (current) use of anticoagulants: Secondary | ICD-10-CM | POA: Diagnosis not present

## 2020-07-13 DIAGNOSIS — G4700 Insomnia, unspecified: Secondary | ICD-10-CM | POA: Diagnosis not present

## 2020-07-13 DIAGNOSIS — I447 Left bundle-branch block, unspecified: Secondary | ICD-10-CM | POA: Diagnosis not present

## 2020-07-13 DIAGNOSIS — K5790 Diverticulosis of intestine, part unspecified, without perforation or abscess without bleeding: Secondary | ICD-10-CM | POA: Diagnosis not present

## 2020-07-13 DIAGNOSIS — I252 Old myocardial infarction: Secondary | ICD-10-CM | POA: Diagnosis not present

## 2020-07-13 DIAGNOSIS — K580 Irritable bowel syndrome with diarrhea: Secondary | ICD-10-CM | POA: Diagnosis not present

## 2020-07-13 DIAGNOSIS — D649 Anemia, unspecified: Secondary | ICD-10-CM | POA: Diagnosis not present

## 2020-07-13 DIAGNOSIS — Z96653 Presence of artificial knee joint, bilateral: Secondary | ICD-10-CM | POA: Diagnosis not present

## 2020-07-13 DIAGNOSIS — I4891 Unspecified atrial fibrillation: Secondary | ICD-10-CM | POA: Diagnosis not present

## 2020-07-13 DIAGNOSIS — Z96642 Presence of left artificial hip joint: Secondary | ICD-10-CM | POA: Diagnosis not present

## 2020-07-13 DIAGNOSIS — I071 Rheumatic tricuspid insufficiency: Secondary | ICD-10-CM | POA: Diagnosis not present

## 2020-07-13 DIAGNOSIS — I5043 Acute on chronic combined systolic (congestive) and diastolic (congestive) heart failure: Secondary | ICD-10-CM | POA: Diagnosis not present

## 2020-07-13 DIAGNOSIS — S83011D Lateral subluxation of right patella, subsequent encounter: Secondary | ICD-10-CM | POA: Diagnosis not present

## 2020-07-13 DIAGNOSIS — Z7952 Long term (current) use of systemic steroids: Secondary | ICD-10-CM | POA: Diagnosis not present

## 2020-07-13 DIAGNOSIS — M810 Age-related osteoporosis without current pathological fracture: Secondary | ICD-10-CM | POA: Diagnosis not present

## 2020-07-13 DIAGNOSIS — I110 Hypertensive heart disease with heart failure: Secondary | ICD-10-CM | POA: Diagnosis not present

## 2020-07-13 DIAGNOSIS — K219 Gastro-esophageal reflux disease without esophagitis: Secondary | ICD-10-CM | POA: Diagnosis not present

## 2020-07-13 NOTE — Telephone Encounter (Signed)
Per Dr Sunday Spillers notes on 02/05/19, has spoke with patient and ok to continue 2.5mg 

## 2020-07-13 NOTE — Telephone Encounter (Signed)
Refill received for eliquis 2.5mg  but pt qualifies for 5mg  will route to pharmd pool. 38f, 64kg, scr 0.85 06/30/20, lovw/[place] 05/20/20

## 2020-07-14 NOTE — Telephone Encounter (Signed)
Advised [employer]

## 2020-07-16 DIAGNOSIS — M5431 Sciatica, right side: Secondary | ICD-10-CM | POA: Diagnosis not present

## 2020-07-16 DIAGNOSIS — K5790 Diverticulosis of intestine, part unspecified, without perforation or abscess without bleeding: Secondary | ICD-10-CM | POA: Diagnosis not present

## 2020-07-16 DIAGNOSIS — K580 Irritable bowel syndrome with diarrhea: Secondary | ICD-10-CM | POA: Diagnosis not present

## 2020-07-16 DIAGNOSIS — I5043 Acute on chronic combined systolic (congestive) and diastolic (congestive) heart failure: Secondary | ICD-10-CM | POA: Diagnosis not present

## 2020-07-16 DIAGNOSIS — I428 Other cardiomyopathies: Secondary | ICD-10-CM | POA: Diagnosis not present

## 2020-07-16 DIAGNOSIS — I4891 Unspecified atrial fibrillation: Secondary | ICD-10-CM | POA: Diagnosis not present

## 2020-07-16 DIAGNOSIS — I447 Left bundle-branch block, unspecified: Secondary | ICD-10-CM | POA: Diagnosis not present

## 2020-07-16 DIAGNOSIS — I252 Old myocardial infarction: Secondary | ICD-10-CM | POA: Diagnosis not present

## 2020-07-16 DIAGNOSIS — Z7952 Long term (current) use of systemic steroids: Secondary | ICD-10-CM | POA: Diagnosis not present

## 2020-07-16 DIAGNOSIS — I110 Hypertensive heart disease with heart failure: Secondary | ICD-10-CM | POA: Diagnosis not present

## 2020-07-16 DIAGNOSIS — G4700 Insomnia, unspecified: Secondary | ICD-10-CM | POA: Diagnosis not present

## 2020-07-16 DIAGNOSIS — M810 Age-related osteoporosis without current pathological fracture: Secondary | ICD-10-CM | POA: Diagnosis not present

## 2020-07-16 DIAGNOSIS — S83011D Lateral subluxation of right patella, subsequent encounter: Secondary | ICD-10-CM | POA: Diagnosis not present

## 2020-07-16 DIAGNOSIS — Z96653 Presence of artificial knee joint, bilateral: Secondary | ICD-10-CM | POA: Diagnosis not present

## 2020-07-16 DIAGNOSIS — Z7901 Long term (current) use of anticoagulants: Secondary | ICD-10-CM | POA: Diagnosis not present

## 2020-07-16 DIAGNOSIS — K219 Gastro-esophageal reflux disease without esophagitis: Secondary | ICD-10-CM | POA: Diagnosis not present

## 2020-07-16 DIAGNOSIS — I071 Rheumatic tricuspid insufficiency: Secondary | ICD-10-CM | POA: Diagnosis not present

## 2020-07-16 DIAGNOSIS — F32A Depression, unspecified: Secondary | ICD-10-CM | POA: Diagnosis not present

## 2020-07-16 DIAGNOSIS — I272 Pulmonary hypertension, unspecified: Secondary | ICD-10-CM | POA: Diagnosis not present

## 2020-07-16 DIAGNOSIS — Z96642 Presence of left artificial hip joint: Secondary | ICD-10-CM | POA: Diagnosis not present

## 2020-07-16 DIAGNOSIS — D649 Anemia, unspecified: Secondary | ICD-10-CM | POA: Diagnosis not present

## 2020-07-21 ENCOUNTER — Ambulatory Visit: Payer: BC Managed Care – PPO | Admitting: General Practice

## 2020-07-21 DIAGNOSIS — K580 Irritable bowel syndrome with diarrhea: Secondary | ICD-10-CM | POA: Diagnosis not present

## 2020-07-21 DIAGNOSIS — I4891 Unspecified atrial fibrillation: Secondary | ICD-10-CM | POA: Diagnosis not present

## 2020-07-21 DIAGNOSIS — I447 Left bundle-branch block, unspecified: Secondary | ICD-10-CM | POA: Diagnosis not present

## 2020-07-21 DIAGNOSIS — I071 Rheumatic tricuspid insufficiency: Secondary | ICD-10-CM | POA: Diagnosis not present

## 2020-07-21 DIAGNOSIS — S83011D Lateral subluxation of right patella, subsequent encounter: Secondary | ICD-10-CM | POA: Diagnosis not present

## 2020-07-21 DIAGNOSIS — K5790 Diverticulosis of intestine, part unspecified, without perforation or abscess without bleeding: Secondary | ICD-10-CM | POA: Diagnosis not present

## 2020-07-21 DIAGNOSIS — I110 Hypertensive heart disease with heart failure: Secondary | ICD-10-CM | POA: Diagnosis not present

## 2020-07-21 DIAGNOSIS — F32A Depression, unspecified: Secondary | ICD-10-CM | POA: Diagnosis not present

## 2020-07-21 DIAGNOSIS — I272 Pulmonary hypertension, unspecified: Secondary | ICD-10-CM | POA: Diagnosis not present

## 2020-07-21 DIAGNOSIS — I252 Old myocardial infarction: Secondary | ICD-10-CM | POA: Diagnosis not present

## 2020-07-21 DIAGNOSIS — K219 Gastro-esophageal reflux disease without esophagitis: Secondary | ICD-10-CM | POA: Diagnosis not present

## 2020-07-21 DIAGNOSIS — G4700 Insomnia, unspecified: Secondary | ICD-10-CM | POA: Diagnosis not present

## 2020-07-21 DIAGNOSIS — Z96642 Presence of left artificial hip joint: Secondary | ICD-10-CM | POA: Diagnosis not present

## 2020-07-21 DIAGNOSIS — D649 Anemia, unspecified: Secondary | ICD-10-CM | POA: Diagnosis not present

## 2020-07-21 DIAGNOSIS — M5431 Sciatica, right side: Secondary | ICD-10-CM | POA: Diagnosis not present

## 2020-07-21 DIAGNOSIS — I428 Other cardiomyopathies: Secondary | ICD-10-CM | POA: Diagnosis not present

## 2020-07-21 DIAGNOSIS — Z7901 Long term (current) use of anticoagulants: Secondary | ICD-10-CM | POA: Diagnosis not present

## 2020-07-21 DIAGNOSIS — Z96653 Presence of artificial knee joint, bilateral: Secondary | ICD-10-CM | POA: Diagnosis not present

## 2020-07-21 DIAGNOSIS — M810 Age-related osteoporosis without current pathological fracture: Secondary | ICD-10-CM | POA: Diagnosis not present

## 2020-07-21 DIAGNOSIS — Z7952 Long term (current) use of systemic steroids: Secondary | ICD-10-CM | POA: Diagnosis not present

## 2020-07-21 DIAGNOSIS — I5043 Acute on chronic combined systolic (congestive) and diastolic (congestive) heart failure: Secondary | ICD-10-CM | POA: Diagnosis not present

## 2020-07-22 ENCOUNTER — Telehealth: Payer: Self-pay | Admitting: General Practice

## 2020-07-22 NOTE — Telephone Encounter (Signed)
I helped her complete a [place] patient assistance application while she was seen in our [facility] clinic. Dr. Leonides Sake office submitted the paperwork. I contacted the program this afternoon and they said her application will be fully processed in about 3 days and then they will deliver to her home.

## 2020-07-22 NOTE — Telephone Encounter (Signed)
New Message:     Pt said she thought she was suppose to be getting some assistant with her [place]. She have not heard anything and she only have 3 pills left. She will need some, but can not afford it,

## 2020-07-22 NOTE — Telephone Encounter (Addendum)
Farxiga 10 mg #2 LOT [id number] exp 11/09/2022 Patient aware

## 2020-07-23 DIAGNOSIS — I428 Other cardiomyopathies: Secondary | ICD-10-CM | POA: Diagnosis not present

## 2020-07-23 DIAGNOSIS — I272 Pulmonary hypertension, unspecified: Secondary | ICD-10-CM | POA: Diagnosis not present

## 2020-07-23 DIAGNOSIS — K219 Gastro-esophageal reflux disease without esophagitis: Secondary | ICD-10-CM | POA: Diagnosis not present

## 2020-07-23 DIAGNOSIS — I071 Rheumatic tricuspid insufficiency: Secondary | ICD-10-CM | POA: Diagnosis not present

## 2020-07-23 DIAGNOSIS — Z7952 Long term (current) use of systemic steroids: Secondary | ICD-10-CM | POA: Diagnosis not present

## 2020-07-23 DIAGNOSIS — Z7901 Long term (current) use of anticoagulants: Secondary | ICD-10-CM | POA: Diagnosis not present

## 2020-07-23 DIAGNOSIS — F32A Depression, unspecified: Secondary | ICD-10-CM | POA: Diagnosis not present

## 2020-07-23 DIAGNOSIS — I110 Hypertensive heart disease with heart failure: Secondary | ICD-10-CM | POA: Diagnosis not present

## 2020-07-23 DIAGNOSIS — K5790 Diverticulosis of intestine, part unspecified, without perforation or abscess without bleeding: Secondary | ICD-10-CM | POA: Diagnosis not present

## 2020-07-23 DIAGNOSIS — M5431 Sciatica, right side: Secondary | ICD-10-CM | POA: Diagnosis not present

## 2020-07-23 DIAGNOSIS — I252 Old myocardial infarction: Secondary | ICD-10-CM | POA: Diagnosis not present

## 2020-07-23 DIAGNOSIS — S83011D Lateral subluxation of right patella, subsequent encounter: Secondary | ICD-10-CM | POA: Diagnosis not present

## 2020-07-23 DIAGNOSIS — I5043 Acute on chronic combined systolic (congestive) and diastolic (congestive) heart failure: Secondary | ICD-10-CM | POA: Diagnosis not present

## 2020-07-23 DIAGNOSIS — M810 Age-related osteoporosis without current pathological fracture: Secondary | ICD-10-CM | POA: Diagnosis not present

## 2020-07-23 DIAGNOSIS — Z96653 Presence of artificial knee joint, bilateral: Secondary | ICD-10-CM | POA: Diagnosis not present

## 2020-07-23 DIAGNOSIS — I4891 Unspecified atrial fibrillation: Secondary | ICD-10-CM | POA: Diagnosis not present

## 2020-07-23 DIAGNOSIS — K580 Irritable bowel syndrome with diarrhea: Secondary | ICD-10-CM | POA: Diagnosis not present

## 2020-07-23 DIAGNOSIS — D649 Anemia, unspecified: Secondary | ICD-10-CM | POA: Diagnosis not present

## 2020-07-23 DIAGNOSIS — Z96642 Presence of left artificial hip joint: Secondary | ICD-10-CM | POA: Diagnosis not present

## 2020-07-23 DIAGNOSIS — I447 Left bundle-branch block, unspecified: Secondary | ICD-10-CM | POA: Diagnosis not present

## 2020-07-23 DIAGNOSIS — G4700 Insomnia, unspecified: Secondary | ICD-10-CM | POA: Diagnosis not present

## 2020-07-23 NOTE — Telephone Encounter (Signed)
Spoke with patient, discussed Paul Dykes and patient assistance program which is still pending. No further questions at this time

## 2020-07-28 DIAGNOSIS — G4700 Insomnia, unspecified: Secondary | ICD-10-CM | POA: Diagnosis not present

## 2020-07-28 DIAGNOSIS — Z7901 Long term (current) use of anticoagulants: Secondary | ICD-10-CM | POA: Diagnosis not present

## 2020-07-28 DIAGNOSIS — D649 Anemia, unspecified: Secondary | ICD-10-CM | POA: Diagnosis not present

## 2020-07-28 DIAGNOSIS — I428 Other cardiomyopathies: Secondary | ICD-10-CM | POA: Diagnosis not present

## 2020-07-28 DIAGNOSIS — K5790 Diverticulosis of intestine, part unspecified, without perforation or abscess without bleeding: Secondary | ICD-10-CM | POA: Diagnosis not present

## 2020-07-28 DIAGNOSIS — I272 Pulmonary hypertension, unspecified: Secondary | ICD-10-CM | POA: Diagnosis not present

## 2020-07-28 DIAGNOSIS — I110 Hypertensive heart disease with heart failure: Secondary | ICD-10-CM | POA: Diagnosis not present

## 2020-07-28 DIAGNOSIS — I447 Left bundle-branch block, unspecified: Secondary | ICD-10-CM | POA: Diagnosis not present

## 2020-07-28 DIAGNOSIS — F32A Depression, unspecified: Secondary | ICD-10-CM | POA: Diagnosis not present

## 2020-07-28 DIAGNOSIS — Z7952 Long term (current) use of systemic steroids: Secondary | ICD-10-CM | POA: Diagnosis not present

## 2020-07-28 DIAGNOSIS — K219 Gastro-esophageal reflux disease without esophagitis: Secondary | ICD-10-CM | POA: Diagnosis not present

## 2020-07-28 DIAGNOSIS — I071 Rheumatic tricuspid insufficiency: Secondary | ICD-10-CM | POA: Diagnosis not present

## 2020-07-28 DIAGNOSIS — K580 Irritable bowel syndrome with diarrhea: Secondary | ICD-10-CM | POA: Diagnosis not present

## 2020-07-28 DIAGNOSIS — I4891 Unspecified atrial fibrillation: Secondary | ICD-10-CM | POA: Diagnosis not present

## 2020-07-28 DIAGNOSIS — I252 Old myocardial infarction: Secondary | ICD-10-CM | POA: Diagnosis not present

## 2020-07-28 DIAGNOSIS — M5431 Sciatica, right side: Secondary | ICD-10-CM | POA: Diagnosis not present

## 2020-07-28 DIAGNOSIS — S83011D Lateral subluxation of right patella, subsequent encounter: Secondary | ICD-10-CM | POA: Diagnosis not present

## 2020-07-28 DIAGNOSIS — I5043 Acute on chronic combined systolic (congestive) and diastolic (congestive) heart failure: Secondary | ICD-10-CM | POA: Diagnosis not present

## 2020-07-28 DIAGNOSIS — Z96642 Presence of left artificial hip joint: Secondary | ICD-10-CM | POA: Diagnosis not present

## 2020-07-28 DIAGNOSIS — Z96653 Presence of artificial knee joint, bilateral: Secondary | ICD-10-CM | POA: Diagnosis not present

## 2020-07-28 DIAGNOSIS — M810 Age-related osteoporosis without current pathological fracture: Secondary | ICD-10-CM | POA: Diagnosis not present

## 2020-07-30 DIAGNOSIS — Z96642 Presence of left artificial hip joint: Secondary | ICD-10-CM | POA: Diagnosis not present

## 2020-07-30 DIAGNOSIS — M810 Age-related osteoporosis without current pathological fracture: Secondary | ICD-10-CM | POA: Diagnosis not present

## 2020-07-30 DIAGNOSIS — D649 Anemia, unspecified: Secondary | ICD-10-CM | POA: Diagnosis not present

## 2020-07-30 DIAGNOSIS — I071 Rheumatic tricuspid insufficiency: Secondary | ICD-10-CM | POA: Diagnosis not present

## 2020-07-30 DIAGNOSIS — S83011D Lateral subluxation of right patella, subsequent encounter: Secondary | ICD-10-CM | POA: Diagnosis not present

## 2020-07-30 DIAGNOSIS — K580 Irritable bowel syndrome with diarrhea: Secondary | ICD-10-CM | POA: Diagnosis not present

## 2020-07-30 DIAGNOSIS — I5043 Acute on chronic combined systolic (congestive) and diastolic (congestive) heart failure: Secondary | ICD-10-CM | POA: Diagnosis not present

## 2020-07-30 DIAGNOSIS — K219 Gastro-esophageal reflux disease without esophagitis: Secondary | ICD-10-CM | POA: Diagnosis not present

## 2020-07-30 DIAGNOSIS — Z96653 Presence of artificial knee joint, bilateral: Secondary | ICD-10-CM | POA: Diagnosis not present

## 2020-07-30 DIAGNOSIS — I4891 Unspecified atrial fibrillation: Secondary | ICD-10-CM | POA: Diagnosis not present

## 2020-07-30 DIAGNOSIS — I447 Left bundle-branch block, unspecified: Secondary | ICD-10-CM | POA: Diagnosis not present

## 2020-07-30 DIAGNOSIS — Z7952 Long term (current) use of systemic steroids: Secondary | ICD-10-CM | POA: Diagnosis not present

## 2020-07-30 DIAGNOSIS — M5431 Sciatica, right side: Secondary | ICD-10-CM | POA: Diagnosis not present

## 2020-07-30 DIAGNOSIS — I110 Hypertensive heart disease with heart failure: Secondary | ICD-10-CM | POA: Diagnosis not present

## 2020-07-30 DIAGNOSIS — F32A Depression, unspecified: Secondary | ICD-10-CM | POA: Diagnosis not present

## 2020-07-30 DIAGNOSIS — I272 Pulmonary hypertension, unspecified: Secondary | ICD-10-CM | POA: Diagnosis not present

## 2020-07-30 DIAGNOSIS — I428 Other cardiomyopathies: Secondary | ICD-10-CM | POA: Diagnosis not present

## 2020-07-30 DIAGNOSIS — K5790 Diverticulosis of intestine, part unspecified, without perforation or abscess without bleeding: Secondary | ICD-10-CM | POA: Diagnosis not present

## 2020-07-30 DIAGNOSIS — I252 Old myocardial infarction: Secondary | ICD-10-CM | POA: Diagnosis not present

## 2020-07-30 DIAGNOSIS — Z7901 Long term (current) use of anticoagulants: Secondary | ICD-10-CM | POA: Diagnosis not present

## 2020-07-30 DIAGNOSIS — G4700 Insomnia, unspecified: Secondary | ICD-10-CM | POA: Diagnosis not present

## 2020-08-04 DIAGNOSIS — I4891 Unspecified atrial fibrillation: Secondary | ICD-10-CM | POA: Diagnosis not present

## 2020-08-04 DIAGNOSIS — Z96653 Presence of artificial knee joint, bilateral: Secondary | ICD-10-CM | POA: Diagnosis not present

## 2020-08-04 DIAGNOSIS — D649 Anemia, unspecified: Secondary | ICD-10-CM | POA: Diagnosis not present

## 2020-08-04 DIAGNOSIS — K219 Gastro-esophageal reflux disease without esophagitis: Secondary | ICD-10-CM | POA: Diagnosis not present

## 2020-08-04 DIAGNOSIS — K580 Irritable bowel syndrome with diarrhea: Secondary | ICD-10-CM | POA: Diagnosis not present

## 2020-08-04 DIAGNOSIS — I071 Rheumatic tricuspid insufficiency: Secondary | ICD-10-CM | POA: Diagnosis not present

## 2020-08-04 DIAGNOSIS — Z96642 Presence of left artificial hip joint: Secondary | ICD-10-CM | POA: Diagnosis not present

## 2020-08-04 DIAGNOSIS — Z7952 Long term (current) use of systemic steroids: Secondary | ICD-10-CM | POA: Diagnosis not present

## 2020-08-04 DIAGNOSIS — I428 Other cardiomyopathies: Secondary | ICD-10-CM | POA: Diagnosis not present

## 2020-08-04 DIAGNOSIS — I5043 Acute on chronic combined systolic (congestive) and diastolic (congestive) heart failure: Secondary | ICD-10-CM | POA: Diagnosis not present

## 2020-08-04 DIAGNOSIS — F32A Depression, unspecified: Secondary | ICD-10-CM | POA: Diagnosis not present

## 2020-08-04 DIAGNOSIS — I447 Left bundle-branch block, unspecified: Secondary | ICD-10-CM | POA: Diagnosis not present

## 2020-08-04 DIAGNOSIS — I110 Hypertensive heart disease with heart failure: Secondary | ICD-10-CM | POA: Diagnosis not present

## 2020-08-04 DIAGNOSIS — K5790 Diverticulosis of intestine, part unspecified, without perforation or abscess without bleeding: Secondary | ICD-10-CM | POA: Diagnosis not present

## 2020-08-04 DIAGNOSIS — I252 Old myocardial infarction: Secondary | ICD-10-CM | POA: Diagnosis not present

## 2020-08-04 DIAGNOSIS — M5431 Sciatica, right side: Secondary | ICD-10-CM | POA: Diagnosis not present

## 2020-08-04 DIAGNOSIS — I272 Pulmonary hypertension, unspecified: Secondary | ICD-10-CM | POA: Diagnosis not present

## 2020-08-04 DIAGNOSIS — M810 Age-related osteoporosis without current pathological fracture: Secondary | ICD-10-CM | POA: Diagnosis not present

## 2020-08-04 DIAGNOSIS — S83011D Lateral subluxation of right patella, subsequent encounter: Secondary | ICD-10-CM | POA: Diagnosis not present

## 2020-08-04 DIAGNOSIS — Z7901 Long term (current) use of anticoagulants: Secondary | ICD-10-CM | POA: Diagnosis not present

## 2020-08-04 DIAGNOSIS — G4700 Insomnia, unspecified: Secondary | ICD-10-CM | POA: Diagnosis not present

## 2020-08-06 DIAGNOSIS — I428 Other cardiomyopathies: Secondary | ICD-10-CM | POA: Diagnosis not present

## 2020-08-06 DIAGNOSIS — G4700 Insomnia, unspecified: Secondary | ICD-10-CM | POA: Diagnosis not present

## 2020-08-06 DIAGNOSIS — Z7901 Long term (current) use of anticoagulants: Secondary | ICD-10-CM | POA: Diagnosis not present

## 2020-08-06 DIAGNOSIS — F32A Depression, unspecified: Secondary | ICD-10-CM | POA: Diagnosis not present

## 2020-08-06 DIAGNOSIS — Z96653 Presence of artificial knee joint, bilateral: Secondary | ICD-10-CM | POA: Diagnosis not present

## 2020-08-06 DIAGNOSIS — D649 Anemia, unspecified: Secondary | ICD-10-CM | POA: Diagnosis not present

## 2020-08-06 DIAGNOSIS — Z96642 Presence of left artificial hip joint: Secondary | ICD-10-CM | POA: Diagnosis not present

## 2020-08-06 DIAGNOSIS — Z7952 Long term (current) use of systemic steroids: Secondary | ICD-10-CM | POA: Diagnosis not present

## 2020-08-06 DIAGNOSIS — K5790 Diverticulosis of intestine, part unspecified, without perforation or abscess without bleeding: Secondary | ICD-10-CM | POA: Diagnosis not present

## 2020-08-06 DIAGNOSIS — M810 Age-related osteoporosis without current pathological fracture: Secondary | ICD-10-CM | POA: Diagnosis not present

## 2020-08-06 DIAGNOSIS — I272 Pulmonary hypertension, unspecified: Secondary | ICD-10-CM | POA: Diagnosis not present

## 2020-08-06 DIAGNOSIS — I110 Hypertensive heart disease with heart failure: Secondary | ICD-10-CM | POA: Diagnosis not present

## 2020-08-06 DIAGNOSIS — S83011D Lateral subluxation of right patella, subsequent encounter: Secondary | ICD-10-CM | POA: Diagnosis not present

## 2020-08-06 DIAGNOSIS — I447 Left bundle-branch block, unspecified: Secondary | ICD-10-CM | POA: Diagnosis not present

## 2020-08-06 DIAGNOSIS — I071 Rheumatic tricuspid insufficiency: Secondary | ICD-10-CM | POA: Diagnosis not present

## 2020-08-06 DIAGNOSIS — I252 Old myocardial infarction: Secondary | ICD-10-CM | POA: Diagnosis not present

## 2020-08-06 DIAGNOSIS — I5043 Acute on chronic combined systolic (congestive) and diastolic (congestive) heart failure: Secondary | ICD-10-CM | POA: Diagnosis not present

## 2020-08-06 DIAGNOSIS — M5431 Sciatica, right side: Secondary | ICD-10-CM | POA: Diagnosis not present

## 2020-08-06 DIAGNOSIS — I4891 Unspecified atrial fibrillation: Secondary | ICD-10-CM | POA: Diagnosis not present

## 2020-08-06 DIAGNOSIS — K219 Gastro-esophageal reflux disease without esophagitis: Secondary | ICD-10-CM | POA: Diagnosis not present

## 2020-08-06 DIAGNOSIS — K580 Irritable bowel syndrome with diarrhea: Secondary | ICD-10-CM | POA: Diagnosis not present

## 2020-08-07 NOTE — Telephone Encounter (Signed)
Per mychart message patient approved through 04/10/2021

## 2020-08-11 DIAGNOSIS — M810 Age-related osteoporosis without current pathological fracture: Secondary | ICD-10-CM | POA: Diagnosis not present

## 2020-08-11 DIAGNOSIS — I071 Rheumatic tricuspid insufficiency: Secondary | ICD-10-CM | POA: Diagnosis not present

## 2020-08-11 DIAGNOSIS — I428 Other cardiomyopathies: Secondary | ICD-10-CM | POA: Diagnosis not present

## 2020-08-11 DIAGNOSIS — D649 Anemia, unspecified: Secondary | ICD-10-CM | POA: Diagnosis not present

## 2020-08-11 DIAGNOSIS — Z96642 Presence of left artificial hip joint: Secondary | ICD-10-CM | POA: Diagnosis not present

## 2020-08-11 DIAGNOSIS — Z7901 Long term (current) use of anticoagulants: Secondary | ICD-10-CM | POA: Diagnosis not present

## 2020-08-11 DIAGNOSIS — I5043 Acute on chronic combined systolic (congestive) and diastolic (congestive) heart failure: Secondary | ICD-10-CM | POA: Diagnosis not present

## 2020-08-11 DIAGNOSIS — I4891 Unspecified atrial fibrillation: Secondary | ICD-10-CM | POA: Diagnosis not present

## 2020-08-11 DIAGNOSIS — S83011D Lateral subluxation of right patella, subsequent encounter: Secondary | ICD-10-CM | POA: Diagnosis not present

## 2020-08-11 DIAGNOSIS — I272 Pulmonary hypertension, unspecified: Secondary | ICD-10-CM | POA: Diagnosis not present

## 2020-08-11 DIAGNOSIS — I447 Left bundle-branch block, unspecified: Secondary | ICD-10-CM | POA: Diagnosis not present

## 2020-08-11 DIAGNOSIS — G4700 Insomnia, unspecified: Secondary | ICD-10-CM | POA: Diagnosis not present

## 2020-08-11 DIAGNOSIS — Z96653 Presence of artificial knee joint, bilateral: Secondary | ICD-10-CM | POA: Diagnosis not present

## 2020-08-11 DIAGNOSIS — I110 Hypertensive heart disease with heart failure: Secondary | ICD-10-CM | POA: Diagnosis not present

## 2020-08-11 DIAGNOSIS — I252 Old myocardial infarction: Secondary | ICD-10-CM | POA: Diagnosis not present

## 2020-08-11 DIAGNOSIS — K580 Irritable bowel syndrome with diarrhea: Secondary | ICD-10-CM | POA: Diagnosis not present

## 2020-08-11 DIAGNOSIS — F32A Depression, unspecified: Secondary | ICD-10-CM | POA: Diagnosis not present

## 2020-08-11 DIAGNOSIS — M5431 Sciatica, right side: Secondary | ICD-10-CM | POA: Diagnosis not present

## 2020-08-11 DIAGNOSIS — K5790 Diverticulosis of intestine, part unspecified, without perforation or abscess without bleeding: Secondary | ICD-10-CM | POA: Diagnosis not present

## 2020-08-11 DIAGNOSIS — K219 Gastro-esophageal reflux disease without esophagitis: Secondary | ICD-10-CM | POA: Diagnosis not present

## 2020-08-11 DIAGNOSIS — Z7952 Long term (current) use of systemic steroids: Secondary | ICD-10-CM | POA: Diagnosis not present

## 2020-08-12 DIAGNOSIS — Z9889 Other specified postprocedural states: Secondary | ICD-10-CM | POA: Diagnosis not present

## 2020-08-13 DIAGNOSIS — Z7952 Long term (current) use of systemic steroids: Secondary | ICD-10-CM | POA: Diagnosis not present

## 2020-08-13 DIAGNOSIS — I110 Hypertensive heart disease with heart failure: Secondary | ICD-10-CM | POA: Diagnosis not present

## 2020-08-13 DIAGNOSIS — Z96642 Presence of left artificial hip joint: Secondary | ICD-10-CM | POA: Diagnosis not present

## 2020-08-13 DIAGNOSIS — I5043 Acute on chronic combined systolic (congestive) and diastolic (congestive) heart failure: Secondary | ICD-10-CM | POA: Diagnosis not present

## 2020-08-13 DIAGNOSIS — Z7901 Long term (current) use of anticoagulants: Secondary | ICD-10-CM | POA: Diagnosis not present

## 2020-08-13 DIAGNOSIS — I272 Pulmonary hypertension, unspecified: Secondary | ICD-10-CM | POA: Diagnosis not present

## 2020-08-13 DIAGNOSIS — D649 Anemia, unspecified: Secondary | ICD-10-CM | POA: Diagnosis not present

## 2020-08-13 DIAGNOSIS — I252 Old myocardial infarction: Secondary | ICD-10-CM | POA: Diagnosis not present

## 2020-08-13 DIAGNOSIS — M5431 Sciatica, right side: Secondary | ICD-10-CM | POA: Diagnosis not present

## 2020-08-13 DIAGNOSIS — K5790 Diverticulosis of intestine, part unspecified, without perforation or abscess without bleeding: Secondary | ICD-10-CM | POA: Diagnosis not present

## 2020-08-13 DIAGNOSIS — G4700 Insomnia, unspecified: Secondary | ICD-10-CM | POA: Diagnosis not present

## 2020-08-13 DIAGNOSIS — I071 Rheumatic tricuspid insufficiency: Secondary | ICD-10-CM | POA: Diagnosis not present

## 2020-08-13 DIAGNOSIS — F32A Depression, unspecified: Secondary | ICD-10-CM | POA: Diagnosis not present

## 2020-08-13 DIAGNOSIS — I428 Other cardiomyopathies: Secondary | ICD-10-CM | POA: Diagnosis not present

## 2020-08-13 DIAGNOSIS — I4891 Unspecified atrial fibrillation: Secondary | ICD-10-CM | POA: Diagnosis not present

## 2020-08-13 DIAGNOSIS — S83011D Lateral subluxation of right patella, subsequent encounter: Secondary | ICD-10-CM | POA: Diagnosis not present

## 2020-08-13 DIAGNOSIS — I447 Left bundle-branch block, unspecified: Secondary | ICD-10-CM | POA: Diagnosis not present

## 2020-08-13 DIAGNOSIS — K219 Gastro-esophageal reflux disease without esophagitis: Secondary | ICD-10-CM | POA: Diagnosis not present

## 2020-08-13 DIAGNOSIS — M810 Age-related osteoporosis without current pathological fracture: Secondary | ICD-10-CM | POA: Diagnosis not present

## 2020-08-13 DIAGNOSIS — Z96653 Presence of artificial knee joint, bilateral: Secondary | ICD-10-CM | POA: Diagnosis not present

## 2020-08-13 DIAGNOSIS — K580 Irritable bowel syndrome with diarrhea: Secondary | ICD-10-CM | POA: Diagnosis not present

## 2020-09-11 ENCOUNTER — Encounter: Payer: Self-pay | Admitting: Cardiovascular Disease

## 2020-09-11 ENCOUNTER — Other Ambulatory Visit: Payer: Self-pay

## 2020-09-11 ENCOUNTER — Ambulatory Visit: Payer: Medicare Other | Admitting: Cardiovascular Disease

## 2020-09-11 DIAGNOSIS — I4891 Unspecified atrial fibrillation: Secondary | ICD-10-CM

## 2020-09-11 DIAGNOSIS — I272 Pulmonary hypertension, unspecified: Secondary | ICD-10-CM | POA: Diagnosis not present

## 2020-09-11 DIAGNOSIS — I5042 Chronic combined systolic (congestive) and diastolic (congestive) heart failure: Secondary | ICD-10-CM | POA: Diagnosis not present

## 2020-09-11 NOTE — Assessment & Plan Note (Signed)
LVEF 30%.  She is euvolemic and doing well.  Continue digoxin, losartan, and metoprolol.  She had some low blood pressures on Farxiga and discontinued it.  She is unclear whether this was the cause.  We will reduce the dose to 5 mg and see if she tolerates that better.  She has been taking torsemide as needed and her weights have been stable.  She is only needing to take it once or twice a week.

## 2020-09-11 NOTE — Assessment & Plan Note (Addendum)
She remains in atrial fibrillation.  Rates are well-controlled.  Continue Eliquis.  Digoxin level was therapeutic 06/2020.

## 2020-09-11 NOTE — Patient Instructions (Signed)
Medication Instructions:  DECREASE YOUR FARXIGA TO 5 MG DAILY   *If you need a refill on your cardiac medications before your next appointment, please call your pharmacy*  Lab Work: NONE   Testing/Procedures: NONE  Follow-Up: At [facility], you and your health needs are our priority.  As part of our continuing mission to provide you with exceptional heart care, we have created designated Provider Care Teams.  These Care Teams include your primary Cardiologist (physician) and Advanced Practice Providers (APPs -  Physician Assistants and Nurse Practitioners) who all work together to provide you with the care you need, when you need it.  We recommend signing up for the patient portal called "MyChart".  Sign up information is provided on this After Visit Summary.  MyChart is used to connect with patients for Virtual Visits (Telemedicine).  Patients are able to view lab/test results, encounter notes, upcoming appointments, etc.  Non-urgent messages can be sent to your provider as well.   To learn more about what you can do with MyChart, go to [email or web address].    Your next appointment:   3 month(s)  The format for your next appointment:   In Person  Provider:   DR Dauphin/NP AT [facility] LOCATION

## 2020-09-11 NOTE — Progress Notes (Signed)
Cardiology Office Note   Date:  09/11/2020   ID:  [name], DOB 11-05-1930, MRN [id number]  PCP:  [name], MD  Cardiologist:   [name], MD  Cardiology APP: [name], [name]  No chief complaint on file.    History of Present Illness: [name] is a 87 y.o. female with chronic atrial fibrillation, chronic systolic and diastolic heart failure, left bundle branch block, spontaneous retroperitoneal bleed on Coumadin, and colitis who presents for follow-up. [name] is previously seen by [name] in clinic 03/2017 and cleared for surgery. At that time [name] was in atrial fibrillation with well-controlled rates. [name] underwent left knee replacement 04/2017. Her hospital course was complicated by atrial fibrillation with rapid ventricular response and acute systolic and diastolic heart failure. Echo that admission revealed LVEF 15% which was down from 50% in 2017. [name] was diuresed with IV Lasix and had a Lexiscan Myoview without evidence of ischemia. [name] failed amiodarone and was started on digoxin. [name] had a repeat echocardiogram 07/2017 that revealed LVEF 20 to 25%.  I just later did take orthostatics losartan was discontinued due to low BP and fatigue. Her fatigue has improved. [name] also struggled with diarrhea and stopped taking her digoxin in hopes that this would help. However the diarrhea did not improve. Digoxin was resumed.  [name] has declined ICD.  [name] saw her PCP 06/05/2018 and was in atrial fibrillation at that time.   Since her last appointment [name] continues to struggle with fatigue and dizziness.  [name] notes dizziness upon standing.  [name] feels tired constantly.  [name] has no dizziness when lying down.  [name] wonders if it could be related to dehydration.  Torsemide was discontinued because [name] thought it was causing diarrhea.  Initially they cut it in half and then [name] stopped it.  There was not really much improvement in her symptoms.  In fact, it  seems as though [name] may be getting more dizzy.  [name] has no lower extremity edema, orthopnea, or PND.  [name] has been taking prednisone for her colitis which seems to have helped both her stomach and her knees.  Metoprolol was held due to dizziness and fatigue.  [name] was started on amiodarone for rate control.  [name] saw Dr. Para March on 7/20 and was still feeling fatigued and sleepy.  Her heart rate was back up to the 120s.  [name] was started back on metoprolol.  [name] was referred to the advanced heart failure clinic but did not see them because [name] was first admitted to the hospital 07/2019 with a fall and a femoral neck fracture.  [name] underwent surgery without complication.  [name] did require diuresis with IV Lasix.  Echo at that time revealed LVEF 35% with global hypokinesis and moderate LVH.  [name] also had moderately reduced right-sided function with severe pulmonary hypertension.  PASP was 83 mmHg.  [name] has been struggling with the L knee popping out.  [name] previously had that knee replaced and needs to have it fixed.  They can fix it with just spinal anesthesia.  [name] thinks [name] will need R knee replacement as well.  Lately her breathing has been OK.  [name] has occasional epigstric discomfort but always at rest.  [name] has no orthopnea or PND.  Her BP has been well-controlled at home.  Her heart rate has been mostly in the 80-90s.  [name] has no edema, orthopnea or PND.  [name] has been in atrial fibrillation since 2010.  [name] complains of poor balance and dizziness standing.  It improved after increasing metoprolol.  [name] was referred to ADV HF clinic for pulmonary hypertension but ultimately decided not to go due to her advanced age. [name] started on depression medication and her mood has improved. [name] was hospitalized 06/2020 for a right knee repair. [name] was discharged and then admitted to the hospital 06/23/2020 with acute on chronic heart failure with weight gain. [name] was diuresed with IV Lasix.   Today, [name] is accompanied by her  husband. Lately, [name] has been feeling fatigued and has no energy. [name] is still recovering from her knee surgery, and her appetite is improving well. However, [name] wonders if [name] is iron deficient and might need a multivitamin. At home her heart rate has been stable, and her blood pressure has been generally higher than when [name] was taking [place]. Her weight has been very consistent around 140-143. If [name] notices that [name] gains over a pound between readings [name] will take torsemide. Currently [name] is wearing a knee brace. Her knees are limiting her physical exercise, but [name] does get up and walk around every hour or two hours using her walker. [name] feels wobbly and unsteady, and feels like [name] does not have any strength in her lower extremities. [name] is concerned that [name] typically feels like [name] can just collapse due to her LE weakness. For her diet, they are trying to limit eating out for meals, and her husband has been cooking more often. Also, [name] stopped taking Bupropion because [name] thought [name] may have been having a reaction with taking sertraline. [name] denies any chest pain, shortness of breath, palpitations, or exertional symptoms. No headaches, lightheadedness, or syncope to report. Also has no lower extremity edema, orthopnea or PND.   Past Medical History:  Diagnosis Date  . Acute combined systolic and diastolic heart failure (HCC) 05/02/2017  . Anemia   . Anxiety   . Arthritis    "knees" (06/18/2015)  . Atrial fibrillation with RVR (HCC) 06/18/2015   h/o sig bleed on coumadin  . Cardiogenic shock (HCC) 05/02/2017  . Complication of anesthesia 2010   "w/cardiac cath; got into a psychotic state for ~ 3 days; got me out w/Valium"  . Depression    "I have it off and on; not as often as I've gotten older" (06/18/2015)  . Diverticulosis    descending and sigmoid colon--Dr. Jarold Motto  . DJD (degenerative joint disease) of knee    left  . Dyspnea    with exertion and in morning mostly when wakes up    . E coli infection    History of  . GERD (gastroesophageal reflux disease)   . History of Clostridium difficile colitis   . IBS (irritable bowel syndrome)   . Insomnia     off chronic ambien 5mg  as of 10/11, rare/episodic use since then  DCM/CHF  . LBBB (left bundle branch block)   . Lymphocytic colitis 09/27/2016  . Moderate to severe pulmonary hypertension (HCC) 11/26/2019  . Nonischemic cardiomyopathy (HCC)    normal coronaries, EF 15-20% 04/2008, EF 50-55% 2015  . Osteoporosis    on DXA 05/2010  . Positive PPD   . Retroperitoneal bleed   . Sciatica    Right    Past Surgical History:  Procedure Laterality Date  . ANTERIOR APPROACH HEMI HIP ARTHROPLASTY Left 08/05/2019   Procedure: ANTERIOR APPROACH HEMI HIP ARTHROPLASTY;  Surgeon: 08/07/2019, MD;  Location: [facility] OR;  Service: Orthopedics;  Laterality: Left;  .  BIOPSY  11/22/2017   Procedure: BIOPSY;  Surgeon: Iva Boop, MD;  Location: [facility] ENDOSCOPY;  Service: Endoscopy;;  . CARDIAC CATHETERIZATION  2010  . CARDIOVERSION N/A 06/19/2015   Procedure: CARDIOVERSION;  Surgeon: Laurey Morale, MD;  Location: [facility] ENDOSCOPY;  Service: Cardiovascular;  Laterality: N/A;  . CATARACT EXTRACTION W/ INTRAOCULAR LENS  IMPLANT, BILATERAL Bilateral   . COLONOSCOPY WITH PROPOFOL N/A 11/22/2017   Procedure: COLONOSCOPY WITH PROPOFOL;  Surgeon: Iva Boop, MD;  Location: [facility] ENDOSCOPY;  Service: Endoscopy;  Laterality: N/A;  . DILATION AND CURETTAGE OF UTERUS    . TEE WITHOUT CARDIOVERSION N/A 06/19/2015   Procedure: TRANSESOPHAGEAL ECHOCARDIOGRAM (TEE);  Surgeon: Laurey Morale, MD;  Location: [facility] ENDOSCOPY;  Service: Cardiovascular;  Laterality: N/A;  . TOTAL KNEE ARTHROPLASTY Left 04/25/2017   Procedure: TOTAL KNEE ARTHROPLASTY;  Surgeon: [name], MD;  Location: [facility] OR;  Service: Orthopedics;  Laterality: Left;  . TOTAL KNEE ARTHROPLASTY Right 02/18/2020   Procedure: RIGHT TOTAL KNEE ARTHROPLASTY;  Surgeon: [name], MD;   Location: [facility] ORS;  Service: Orthopedics;  Laterality: Right;  . TOTAL KNEE ARTHROPLASTY Right 06/16/2020   Procedure: RIGHT RETINACULAR REPAIR;  Surgeon: [name], MD;  Location: [facility] ORS;  Service: Orthopedics;  Laterality: Right;  . TOTAL KNEE REVISION Left 12/03/2019   Procedure: LEFT TOTAL KNEE REVISION  OF POYLY EXCHANGE;  Surgeon: [name], MD;  Location: [facility] ORS;  Service: Orthopedics;  Laterality: Left;  [name] VAGINAL HYSTERECTOMY  1979   ovaries intact     Current Outpatient Medications  Medication Sig Dispense Refill  . acetaminophen (TYLENOL) 500 MG tablet Take 1,000 mg by mouth 2 (two) times daily.    . calcium carbonate (TUMS - DOSED IN MG ELEMENTAL CALCIUM) 500 MG chewable tablet Chew 500 mg by mouth 2 (two) times daily as needed for indigestion or heartburn.    . cholecalciferol (VITAMIN D3) 25 MCG (1000 UNIT) tablet Take 1,000 Units by mouth daily.    . dapagliflozin propanediol (FARXIGA) 10 MG TABS tablet Take 10 mg by mouth as directed. TAKE 1/2 TABLET DAILY    . digoxin (LANOXIN) 0.125 MG tablet TAKE 1/2 TABLET (0.0625 MG) BY MOUTH DAILY. 15 tablet 6  . ELIQUIS 2.5 MG TABS tablet TAKE 1 TABLET BY MOUTH TWICE A DAY 180 tablet 1  . loperamide (IMODIUM) 2 MG capsule Take 2 mg by mouth 3 (three) times daily as needed for diarrhea or loose stools.     . Menthol-Methyl Salicylate (SALONPAS PAIN RELIEF PATCH EX) Apply 2 patches topically daily as needed (pain).    . metoprolol succinate (TOPROL-XL) 100 MG 24 hr tablet Take 1 tablet (100 mg total) by mouth daily. Take with or immediately following a meal. 90 tablet 3  . Polyethyl Glycol-Propyl Glycol (SYSTANE OP) Place 1-2 drops into the left eye 3 (three) times daily as needed (dryness).    . potassium chloride SA (KLOR-CON) 20 MEQ tablet TAKE 2 TABLETS (40 MEQ TOTAL) BY MOUTH DAILY. 60 tablet 3  . predniSONE (DELTASONE) 5 MG tablet Take 1 tablet (5 mg total) by mouth daily with breakfast.    . sertraline (ZOLOFT) 50 MG tablet  Take 2 tablets (100 mg total) by mouth daily.    [name] torsemide (DEMADEX) 20 MG tablet Take 1 tablet (20 mg total) by mouth daily as needed. if 3 pound weight gain overnight or 5 pounds in a week. 30 tablet 5  . losartan (COZAAR) 25 MG tablet Take 1 tablet (25 mg total)  by mouth daily. 90 tablet 3   No current facility-administered medications for this visit.    Allergies:   Ace inhibitors, Warfarin sodium, Famotidine, Codeine, Delsym [dextromethorphan polistirex er], Lactose intolerance (gi), Lasix [furosemide], Phenylephrine, and Sulfamethoxazole-trimethoprim    Social History:  The patient  reports that [name] has never smoked. [name] has never used smokeless tobacco. [name] reports that [name] does not drink alcohol and does not use drugs.   Family History:  The patient's family history includes Colon cancer in her father; Other in her father; Tuberculosis in her mother.    ROS:   Please see the history of present illness. (+) Fatigue (+) Knee pain (+) LE weakness (+) Gait instability All other systems are reviewed and negative.    PHYSICAL EXAM: VS:  BP 110/70 (BP Location: Left Arm, Patient Position: Sitting, Cuff Size: Normal)   Pulse 96   Ht 5\' 5"  (1.651 m)   Wt 148 lb (67.1 kg)   SpO2 97%   BMI 24.63 kg/m  , BMI Body mass index is 24.63 kg/m. GENERAL:  Well appearing HEENT: Pupils equal round and reactive, fundi not visualized, oral mucosa unremarkable NECK:  No jugular venous distention, waveform within normal limits, carotid upstroke brisk and symmetric, no bruits LUNGS:  Clear to auscultation bilaterally HEART:  Irregularly irregular.  PMI not displaced or sustained,S1 and S2 within normal limits, no S3, no S4, no clicks, no rubs, II/VI systolic murmur at the apex ABD:  Flat, positive bowel sounds normal in frequency in pitch, no bruits, no rebound, no guarding, no midline pulsatile mass, no hepatomegaly, no splenomegaly EXT:  2 plus pulses throughout, no edema, no cyanosis no  clubbing SKIN:  No rashes no nodules NEURO:  Cranial nerves II through XII grossly intact, motor grossly intact throughout PSYCH:  Cognitively intact, oriented to person place and time   EKG:   02/01/2018: Atrial fibrillation.  Rate 70 bpm.  Right axis deviation.  IVCD. 02/05/2019: Atrial fibrillation.  Rate 88 bpm.  Left axis deviation.  Left bundle branch block. 07/17/19: Atrial fibrillation.  Rate 86 bpm.  LBBB. 11/26/19: Atrial fibrillation. Rate 90 bpm.  LBBB.  LAD.  05/20/20: Atrial fibrillation.  Rate 77 bpm.  Left axis deviation.  Left bundle branch block. 09/11/2020: EKG is not ordered today.  Echo 06/24/2020: 1. Left ventricular ejection fraction, by estimation, is 25 to 30%. Left  ventricular ejection fraction by 3D volume is 30 %. Left ventricular  ejection fraction by 2D MOD biplane is 26.8 %. The left ventricle has  severely decreased function. The left  ventricle demonstrates global hypokinesis. Left ventricular diastolic  function could not be evaluated. The average left ventricular global  longitudinal strain is -7.9 %. The global longitudinal strain is abnormal.  2. Right ventricular systolic function is mildly reduced. The right  ventricular size is mildly enlarged. There is mildly elevated pulmonary  artery systolic pressure. The estimated right ventricular systolic  pressure is 38.3 mmHg.  3. Left atrial size was severely dilated.  4. Right atrial size was severely dilated.  5. The mitral valve is grossly normal. Mild mitral valve regurgitation.  No evidence of mitral stenosis.  6. Tricuspid valve regurgitation is moderate.  7. The aortic valve is tricuspid. There is mild calcification of the  aortic valve. There is mild thickening of the aortic valve. Aortic valve  regurgitation is mild. Mild aortic valve sclerosis is present, with no  evidence of aortic valve stenosis.  8. The inferior vena cava is normal  in size with greater than 50%  respiratory  variability, suggesting right atrial pressure of 3 mmHg.   Comparison(s): Changes from prior study are noted. LVEF now 25-30%. RV  size and function have improved. RVSP improved from prior.    [place] LE Venous 06/23/2020: BILATERAL:  - No evidence of deep vein thrombosis seen in the lower extremities,  bilaterally.  - No evidence of superficial venous thrombosis in the lower extremities,  bilaterally.  -No evidence of popliteal cyst, bilaterally.   Echo 04/30/17: Study Conclusions - Left ventricle: The cavity size was moderately dilated. Wall   thickness was normal. Systolic function was severely reduced. The   estimated ejection fraction was 15%. No mural thrombus with   Definity contrast. Diffuse hypokinesis. The study is not   technically sufficient to allow evaluation of LV diastolic   function. - Aortic valve: Mildly calcified leaflets. There was no stenosis.   There was no regurgitation. - Mitral valve: Mildly thickened leaflets . There was moderate   regurgitation. - Left atrium: Severely dilated. - Right ventricle: The cavity size was mildly dilated. Mildly   reduced systolic function. - Right atrium: Severely dilated. - Tricuspid valve: There was moderate to severe regurgitation. - Pulmonary arteries: PA peak pressure: 45 mm Hg (S). - Inferior vena cava: The vessel was dilated. The respirophasic   diameter changes were blunted (< 50%), consistent with elevated   central venous pressure. - Pericardium, extracardiac: There was a left pleural effusion.  Echo 08/03/17: Study Conclusions  - Left ventricle: The cavity size was normal. Wall thickness was   increased in a pattern of mild LVH. Systolic function was   severely reduced. The estimated ejection fraction was in the   range of 20% to 25%. Diffuse hypokinesis. There is dyskinesis of   the anteroseptal myocardium. The study is not technically   sufficient to allow evaluation of LV diastolic function. - Aortic root:  The aortic root was normal in size. - Mitral valve: Calcified annulus. - Left atrium: The atrium was moderately dilated. - Pulmonary arteries: Systolic pressure was mildly to moderately   increased.  Impressions:  - Diffuse hypokinesis with septal dyskinesis; overall severely   reduced LV systolic function; mild LVH; moderate LAE; mild TR;   mild to moderate pulmonary hypertension.   Recent Labs: 06/23/2020: B Natriuretic Peptide 3,972.5; Hemoglobin 14.0; Platelets 322 06/24/2020: ALT 19; TSH 4.356 06/30/2020: BUN 23; Creatinine, Ser 0.85; Potassium 4.2; Sodium 132    Lipid Panel    Component Value Date/Time   CHOL 261 (H) 12/27/2018 1657   CHOL 156 02/05/2018 0937   TRIG (H) 12/27/2018 1657    424.0 Triglyceride is over 400; calculations on Lipids are invalid.   HDL 32.20 (L) 12/27/2018 1657   HDL 58 02/05/2018 0937   CHOLHDL 8 12/27/2018 1657   VLDL 30.2 01/30/2017 1451   LDLCALC 73 02/05/2018 0937   LDLDIRECT 58.0 12/27/2018 1657      Wt Readings from Last 3 Encounters:  09/11/20 148 lb (67.1 kg)  07/06/20 141 lb (64 kg)  06/30/20 146 lb 12.8 oz (66.6 kg)      ASSESSMENT AND PLAN: Atrial fibrillation with RVR- CHADs VASc=4 [name] remains in atrial fibrillation.  Rates are well-controlled.  Continue Eliquis.  Digoxin level was therapeutic 06/2020.  Moderate to severe pulmonary hypertension (HCC) [name] is currently euvolemic and doing well.  Invasive work-up was deferred due to age.  Continue taking torsemide as needed.  [name] continues to limit her sodium and fluid  intake.  Chronic combined systolic and diastolic heart failure (HCC) LVEF 30%.  [name] is euvolemic and doing well.  Continue digoxin, losartan, and metoprolol.  [name] had some low blood pressures on Farxiga and discontinued it.  [name] is unclear whether this was the cause.  We will reduce the dose to 5 mg and see if [name] tolerates that better.  [name] has been taking torsemide as needed and her weights have been stable.   [name] is only needing to take it once or twice a week.     Current medicines are reviewed at length with the patient today.  The patient has concerns regarding medicines.  The following changes have been made: Add losartan   Labs/ tests ordered today include:   No orders of the defined types were placed in this encounter.    Disposition:   FU with Platon Arocho C. Duke Salviaandolph, MD, [facility] in 3 months  I,Mathew Stumpf,acting as a Neurosurgeonscribe for Chilton Siiffany [place], MD.,have documented all relevant documentation on the behalf of Chilton Siiffany [place], MD,as directed by  Chilton Siiffany [place], MD while in the presence of Chilton Siiffany [place], MD.  I, Tyshun Tuckerman C. Duke Salviaandolph, MD have reviewed all documentation for this visit.  The documentation of the exam, diagnosis, procedures, and orders on 09/11/2020 are all accurate and complete.    Signed, Delecia Vastine C. Duke Salviaandolph, MD, [facility]  09/11/2020 2:44 PM    [facility]

## 2020-09-11 NOTE — Assessment & Plan Note (Signed)
She is currently euvolemic and doing well.  Invasive work-up was deferred due to age.  Continue taking torsemide as needed.  She continues to limit her sodium and fluid intake.

## 2020-09-21 ENCOUNTER — Encounter (HOSPITAL_BASED_OUTPATIENT_CLINIC_OR_DEPARTMENT_OTHER): Payer: Self-pay

## 2020-09-21 ENCOUNTER — Other Ambulatory Visit: Payer: Self-pay | Admitting: Orthopaedic Surgery

## 2020-09-21 ENCOUNTER — Encounter (HOSPITAL_COMMUNITY): Payer: Self-pay | Admitting: Orthopaedic Surgery

## 2020-09-21 ENCOUNTER — Other Ambulatory Visit: Payer: Self-pay

## 2020-09-21 DIAGNOSIS — S82841A Displaced bimalleolar fracture of right lower leg, initial encounter for closed fracture: Secondary | ICD-10-CM | POA: Diagnosis not present

## 2020-09-21 NOTE — Progress Notes (Signed)
COVID Vaccine Completed: Yes Date COVID Vaccine completed: x2 Has received booster: Yes COVID vaccine manufacturer: Pfizer     Date of COVID positive in last 90 days: No  PCP - [name], MD last office visit 07/06/2020 in epic Cardiologist - Tiffany C. Duke Salvia, MD last office visit 09/11/2020 in epic  Chest x-ray - 06/23/20 in epic EKG - 06/24/20 in epic Stress Test - greater than 2 years ECHO - 06/24/20 in epic Cardiac Cath -  greater than 2 years Pacemaker/ICD device last checked: N/A  Sleep Study - N/A CPAP - N/A  Fasting Blood Sugar - N/A Checks Blood Sugar ___N/A__ times a day  Blood Thinner Instructions: Eliquis last dose 09/21/2020 in the morning Aspirin Instructions: N/A Last Dose: N/A  Activity level:  Able to perform activities of daily living without stopping and without symptoms       Anesthesia review: Nonischemic cardiomyopathy, Moderate to severe pulmonary hypertension, Atrial fibrillation with RVR cardioversion, chronic combined systolic and diastolic heart failure, history of Cardiogenic shock, History of LBBB  Patient denies shortness of breath, fever, cough and chest pain at PAT appointment   Patient verbalized understanding of instructions that were given to them at the PAT appointment. Patient was also instructed that they will need to review over the PAT instructions again at home before surgery.

## 2020-09-22 ENCOUNTER — Encounter (HOSPITAL_COMMUNITY): Payer: Self-pay | Admitting: Orthopaedic Surgery

## 2020-09-22 ENCOUNTER — Ambulatory Visit (HOSPITAL_COMMUNITY): Payer: Medicare Other

## 2020-09-22 ENCOUNTER — Encounter (HOSPITAL_COMMUNITY)
Admission: RE | Disposition: A | Discharge status: Home / Self Care | Payer: Self-pay | Source: Home / Self Care | Attending: Orthopaedic Surgery

## 2020-09-22 ENCOUNTER — Ambulatory Visit (HOSPITAL_COMMUNITY): Payer: Medicare Other | Admitting: Physician Assistant

## 2020-09-22 ENCOUNTER — Observation Stay (HOSPITAL_COMMUNITY)
Admission: RE | Admit: 2020-09-22 | Discharge: 2020-09-24 | Disposition: A | Discharge status: Home / Self Care | Payer: Medicare Other | Attending: Orthopaedic Surgery | Admitting: Orthopaedic Surgery

## 2020-09-22 ENCOUNTER — Encounter: Payer: Self-pay | Admitting: Family Medicine

## 2020-09-22 DIAGNOSIS — I110 Hypertensive heart disease with heart failure: Secondary | ICD-10-CM | POA: Diagnosis not present

## 2020-09-22 DIAGNOSIS — Z96653 Presence of artificial knee joint, bilateral: Secondary | ICD-10-CM | POA: Insufficient documentation

## 2020-09-22 DIAGNOSIS — Y92009 Unspecified place in unspecified non-institutional (private) residence as the place of occurrence of the external cause: Secondary | ICD-10-CM | POA: Insufficient documentation

## 2020-09-22 DIAGNOSIS — I5043 Acute on chronic combined systolic (congestive) and diastolic (congestive) heart failure: Secondary | ICD-10-CM | POA: Diagnosis not present

## 2020-09-22 DIAGNOSIS — Z9889 Other specified postprocedural states: Secondary | ICD-10-CM | POA: Diagnosis not present

## 2020-09-22 DIAGNOSIS — Z20822 Contact with and (suspected) exposure to covid-19: Secondary | ICD-10-CM | POA: Insufficient documentation

## 2020-09-22 DIAGNOSIS — Z419 Encounter for procedure for purposes other than remedying health state, unspecified: Secondary | ICD-10-CM

## 2020-09-22 DIAGNOSIS — S82841A Displaced bimalleolar fracture of right lower leg, initial encounter for closed fracture: Secondary | ICD-10-CM | POA: Diagnosis not present

## 2020-09-22 DIAGNOSIS — W19XXXA Unspecified fall, initial encounter: Secondary | ICD-10-CM | POA: Diagnosis not present

## 2020-09-22 DIAGNOSIS — M25511 Pain in right shoulder: Secondary | ICD-10-CM

## 2020-09-22 DIAGNOSIS — E559 Vitamin D deficiency, unspecified: Secondary | ICD-10-CM | POA: Diagnosis not present

## 2020-09-22 DIAGNOSIS — I5041 Acute combined systolic (congestive) and diastolic (congestive) heart failure: Secondary | ICD-10-CM | POA: Diagnosis not present

## 2020-09-22 DIAGNOSIS — Z7901 Long term (current) use of anticoagulants: Secondary | ICD-10-CM | POA: Insufficient documentation

## 2020-09-22 DIAGNOSIS — Z91040 Latex allergy status: Secondary | ICD-10-CM | POA: Diagnosis not present

## 2020-09-22 DIAGNOSIS — S82891A Other fracture of right lower leg, initial encounter for closed fracture: Secondary | ICD-10-CM | POA: Diagnosis not present

## 2020-09-22 HISTORY — PX: ORIF ANKLE FRACTURE: SHX5408

## 2020-09-22 HISTORY — DX: Personal history of other medical treatment: Z92.89

## 2020-09-22 HISTORY — DX: Unspecified urinary incontinence: R32

## 2020-09-22 LAB — BASIC METABOLIC PANEL
Anion gap: 9 (ref 5–15)
BUN: 16 mg/dL (ref 8–23)
CO2: 26 mmol/L (ref 22–32)
Calcium: 9.3 mg/dL (ref 8.9–10.3)
Chloride: 101 mmol/L (ref 98–111)
Creatinine, Ser: 0.8 mg/dL (ref 0.44–1.00)
GFR, Estimated: >60 mL/min (ref >60)
Glucose, Bld: 95 mg/dL (ref 70–99)
Potassium: 4.2 mmol/L (ref 3.5–5.1)
Sodium: 136 mmol/L (ref 135–145)

## 2020-09-22 LAB — CBC
HCT: 37.6 % (ref 36.0–46.0)
Hemoglobin: 12.1 g/dL (ref 12.0–15.0)
MCH: 30.8 pg (ref 26.0–34.0)
MCHC: 32.2 g/dL (ref 30.0–36.0)
MCV: 95.7 fL (ref 80.0–100.0)
Platelets: 169 10*3/uL (ref 150–400)
RBC: 3.93 MIL/uL (ref 3.87–5.11)
RDW: 15.1 % (ref 11.5–15.5)
WBC: 7.3 10*3/uL (ref 4.0–10.5)
nRBC: 0 % (ref 0.0–0.2)

## 2020-09-22 LAB — SARS CORONAVIRUS 2 BY RT PCR (HOSPITAL ORDER, PERFORMED IN ~~LOC~~ HOSPITAL LAB): SARS Coronavirus 2: NEGATIVE

## 2020-09-22 SURGERY — OPEN REDUCTION INTERNAL FIXATION (ORIF) ANKLE FRACTURE
Anesthesia: Monitor Anesthesia Care | Site: Ankle | Laterality: Right

## 2020-09-22 MED ORDER — DEXMEDETOMIDINE HCL IN NACL 200 MCG/50ML IV SOLN
0.4000 ug/kg/h | INTRAVENOUS | Status: DC
  Administered 2020-09-22: .4 ug/kg/h via INTRAVENOUS

## 2020-09-22 MED ORDER — DIPHENHYDRAMINE HCL 12.5 MG/5ML PO ELIX: 12.5000 mg | ORAL_SOLUTION | ORAL | Status: DC | PRN

## 2020-09-22 MED ORDER — ROPIVACAINE HCL 7.5 MG/ML IJ SOLN
INTRAMUSCULAR | Status: DC | PRN
  Administered 2020-09-22: 20 mL via PERINEURAL

## 2020-09-22 MED ORDER — LACTATED RINGERS IV SOLN: INTRAVENOUS | Status: DC

## 2020-09-22 MED ORDER — ONDANSETRON HCL 4 MG/2ML IJ SOLN
INTRAMUSCULAR | Status: DC | PRN
  Administered 2020-09-22: 4 mg via INTRAVENOUS

## 2020-09-22 MED ORDER — ONDANSETRON HCL 4 MG PO TABS: 4.0000 mg | ORAL_TABLET | Freq: Four times a day (QID) | ORAL | Status: DC | PRN

## 2020-09-22 MED ORDER — FENTANYL CITRATE (PF) 100 MCG/2ML IJ SOLN
INTRAMUSCULAR | Status: AC
  Filled 2020-09-22: qty 2

## 2020-09-22 MED ORDER — BUPIVACAINE-EPINEPHRINE (PF) 0.5% -1:200000 IJ SOLN
INTRAMUSCULAR | Status: DC | PRN
  Administered 2020-09-22: 25 mL via PERINEURAL

## 2020-09-22 MED ORDER — ACETAMINOPHEN 325 MG PO TABS
325.0000 mg | ORAL_TABLET | Freq: Four times a day (QID) | ORAL | Status: DC | PRN
  Administered 2020-09-23 – 2020-09-24 (×2): 650 mg via ORAL
  Filled 2020-09-22 (×2): qty 2

## 2020-09-22 MED ORDER — DEXMEDETOMIDINE HCL IN NACL 200 MCG/50ML IV SOLN
INTRAVENOUS | Status: AC
  Filled 2020-09-22: qty 50

## 2020-09-22 MED ORDER — LOPERAMIDE HCL 2 MG PO CAPS: 2.0000 mg | ORAL_CAPSULE | Freq: Three times a day (TID) | ORAL | Status: DC | PRN

## 2020-09-22 MED ORDER — LOSARTAN POTASSIUM 25 MG PO TABS
25.0000 mg | ORAL_TABLET | Freq: Every day | ORAL | Status: DC
  Administered 2020-09-24: 25 mg via ORAL
  Filled 2020-09-22 (×2): qty 1

## 2020-09-22 MED ORDER — APIXABAN 2.5 MG PO TABS
2.5000 mg | ORAL_TABLET | Freq: Two times a day (BID) | ORAL | Status: DC
  Administered 2020-09-23 – 2020-09-24 (×3): 2.5 mg via ORAL
  Filled 2020-09-22 (×3): qty 1

## 2020-09-22 MED ORDER — FENTANYL CITRATE (PF) 100 MCG/2ML IJ SOLN
50.0000 ug | INTRAMUSCULAR | Status: DC
  Administered 2020-09-22 (×2): 50 ug via INTRAVENOUS
  Filled 2020-09-22: qty 2

## 2020-09-22 MED ORDER — METOCLOPRAMIDE HCL 5 MG/ML IJ SOLN: 5.0000 mg | Freq: Three times a day (TID) | INTRAMUSCULAR | Status: DC | PRN

## 2020-09-22 MED ORDER — TORSEMIDE 20 MG PO TABS
20.0000 mg | ORAL_TABLET | Freq: Every day | ORAL | Status: DC | PRN
  Filled 2020-09-22: qty 1

## 2020-09-22 MED ORDER — DEXAMETHASONE SODIUM PHOSPHATE 10 MG/ML IJ SOLN
INTRAMUSCULAR | Status: AC
  Filled 2020-09-22: qty 1

## 2020-09-22 MED ORDER — SERTRALINE HCL 100 MG PO TABS
100.0000 mg | ORAL_TABLET | Freq: Every day | ORAL | Status: DC
  Administered 2020-09-23 – 2020-09-24 (×2): 100 mg via ORAL
  Filled 2020-09-22 (×2): qty 1

## 2020-09-22 MED ORDER — FENTANYL CITRATE (PF) 100 MCG/2ML IJ SOLN
INTRAMUSCULAR | Status: DC | PRN
  Administered 2020-09-22 (×5): 25 ug via INTRAVENOUS

## 2020-09-22 MED ORDER — DEXMEDETOMIDINE (PRECEDEX) IN NS 20 MCG/5ML (4 MCG/ML) IV SYRINGE
PREFILLED_SYRINGE | INTRAVENOUS | Status: DC | PRN
  Administered 2020-09-22 (×5): 4 ug via INTRAVENOUS

## 2020-09-22 MED ORDER — CHLORHEXIDINE GLUCONATE 0.12 % MT SOLN
15.0000 mL | Freq: Once | OROMUCOSAL | Status: AC
  Administered 2020-09-22: 15 mL via OROMUCOSAL

## 2020-09-22 MED ORDER — DOCUSATE SODIUM 100 MG PO CAPS
100.0000 mg | ORAL_CAPSULE | Freq: Two times a day (BID) | ORAL | Status: DC
  Administered 2020-09-22 – 2020-09-24 (×3): 100 mg via ORAL
  Filled 2020-09-22 (×3): qty 1

## 2020-09-22 MED ORDER — METOCLOPRAMIDE HCL 5 MG PO TABS: 5.0000 mg | ORAL_TABLET | Freq: Three times a day (TID) | ORAL | Status: DC | PRN

## 2020-09-22 MED ORDER — METHOCARBAMOL 500 MG PO TABS
500.0000 mg | ORAL_TABLET | Freq: Four times a day (QID) | ORAL | Status: DC | PRN
  Administered 2020-09-23: 500 mg via ORAL
  Filled 2020-09-22: qty 1

## 2020-09-22 MED ORDER — EPHEDRINE SULFATE-NACL 50-0.9 MG/10ML-% IV SOSY
PREFILLED_SYRINGE | INTRAVENOUS | Status: DC | PRN
  Administered 2020-09-22 (×2): 5 mg via INTRAVENOUS

## 2020-09-22 MED ORDER — DIGOXIN 0.0625 MG HALF TABLET
0.0625 mg | ORAL_TABLET | Freq: Every day | ORAL | Status: DC
  Administered 2020-09-23 – 2020-09-24 (×2): 0.0625 mg via ORAL
  Filled 2020-09-22 (×2): qty 1

## 2020-09-22 MED ORDER — PROPOFOL 500 MG/50ML IV EMUL
INTRAVENOUS | Status: AC
  Filled 2020-09-22: qty 50

## 2020-09-22 MED ORDER — ONDANSETRON HCL 4 MG/2ML IJ SOLN: 4.0000 mg | Freq: Four times a day (QID) | INTRAMUSCULAR | Status: DC | PRN

## 2020-09-22 MED ORDER — MORPHINE SULFATE (PF) 2 MG/ML IV SOLN: 0.5000 mg | INTRAVENOUS | Status: DC | PRN

## 2020-09-22 MED ORDER — TRAMADOL HCL 50 MG PO TABS
50.0000 mg | ORAL_TABLET | Freq: Four times a day (QID) | ORAL | Status: DC | PRN
  Administered 2020-09-23 – 2020-09-24 (×4): 50 mg via ORAL
  Filled 2020-09-22 (×4): qty 1

## 2020-09-22 MED ORDER — ORAL CARE MOUTH RINSE: 15.0000 mL | Freq: Once | OROMUCOSAL | Status: AC

## 2020-09-22 MED ORDER — POLYETHYL GLYCOL-PROPYL GLYCOL 0.4-0.3 % OP GEL: Freq: Three times a day (TID) | OPHTHALMIC | Status: DC | PRN

## 2020-09-22 MED ORDER — DAPAGLIFLOZIN PROPANEDIOL 5 MG PO TABS
5.0000 mg | ORAL_TABLET | Freq: Every morning | ORAL | Status: DC
  Administered 2020-09-24: 5 mg via ORAL
  Filled 2020-09-22 (×2): qty 1

## 2020-09-22 MED ORDER — ACETAMINOPHEN 500 MG PO TABS
500.0000 mg | ORAL_TABLET | Freq: Four times a day (QID) | ORAL | Status: AC
  Administered 2020-09-22 – 2020-09-23 (×4): 500 mg via ORAL
  Filled 2020-09-22 (×4): qty 1

## 2020-09-22 MED ORDER — FENTANYL CITRATE (PF) 100 MCG/2ML IJ SOLN: 25.0000 ug | INTRAMUSCULAR | Status: DC | PRN

## 2020-09-22 MED ORDER — METHOCARBAMOL 500 MG IVPB - SIMPLE MED
500.0000 mg | Freq: Four times a day (QID) | INTRAVENOUS | Status: DC | PRN
  Filled 2020-09-22: qty 50

## 2020-09-22 MED ORDER — HYDROCODONE-ACETAMINOPHEN 5-325 MG PO TABS
1.0000 | ORAL_TABLET | ORAL | Status: DC | PRN
Start: 2020-09-22 — End: 2020-09-23
  Administered 2020-09-23: 2 via ORAL
  Filled 2020-09-22: qty 2

## 2020-09-22 MED ORDER — ONDANSETRON HCL 4 MG/2ML IJ SOLN
INTRAMUSCULAR | Status: AC
  Filled 2020-09-22: qty 2

## 2020-09-22 MED ORDER — METOPROLOL SUCCINATE ER 50 MG PO TB24
100.0000 mg | ORAL_TABLET | Freq: Every day | ORAL | Status: DC
  Administered 2020-09-24: 100 mg via ORAL
  Filled 2020-09-22 (×2): qty 2

## 2020-09-22 MED ORDER — ONDANSETRON HCL 4 MG/2ML IJ SOLN: 4.0000 mg | Freq: Once | INTRAMUSCULAR | Status: DC | PRN

## 2020-09-22 MED ORDER — CEFAZOLIN SODIUM-DEXTROSE 2-4 GM/100ML-% IV SOLN
2.0000 g | INTRAVENOUS | Status: AC
  Administered 2020-09-22: 2 g via INTRAVENOUS
  Filled 2020-09-22: qty 100

## 2020-09-22 MED ORDER — 0.9 % SODIUM CHLORIDE (POUR BTL) OPTIME
TOPICAL | Status: DC | PRN
  Administered 2020-09-22: 1000 mL

## 2020-09-22 MED ORDER — POTASSIUM CHLORIDE CRYS ER 20 MEQ PO TBCR
40.0000 meq | EXTENDED_RELEASE_TABLET | Freq: Every day | ORAL | Status: DC
  Administered 2020-09-23 – 2020-09-24 (×2): 40 meq via ORAL
  Filled 2020-09-22 (×2): qty 2

## 2020-09-22 SURGICAL SUPPLY — 45 items
BAG ZIPLOCK 12X15 (MISCELLANEOUS) ×3 IMPLANT
BIT DRILL 2.5X2.75 QC CALB (BIT) ×3 IMPLANT
BIT DRILL 2.9 CANN QC NONSTRL (BIT) ×3 IMPLANT
BNDG ELASTIC 4X5.8 VLCR STR LF (GAUZE/BANDAGES/DRESSINGS) ×3 IMPLANT
BNDG GAUZE ELAST 4 BULKY (GAUZE/BANDAGES/DRESSINGS) ×3 IMPLANT
CLOSURE WOUND 1/2 X4 (GAUZE/BANDAGES/DRESSINGS)
COVER WAND RF STERILE (DRAPES) IMPLANT
CUFF TOURN SGL QUICK 34 (TOURNIQUET CUFF) ×3
CUFF TRNQT CYL 34X4.125X (TOURNIQUET CUFF) ×1 IMPLANT
DRAPE C-ARM 42X120 X-RAY (DRAPES) ×3 IMPLANT
DRAPE U-SHAPE 47X51 STRL (DRAPES) ×3 IMPLANT
DRSG ADAPTIC 3X8 NADH LF (GAUZE/BANDAGES/DRESSINGS) ×6 IMPLANT
DRSG PAD ABDOMINAL 8X10 ST (GAUZE/BANDAGES/DRESSINGS) ×3 IMPLANT
ELECT REM PT RETURN 15FT ADLT (MISCELLANEOUS) ×3 IMPLANT
GAUZE SPONGE 4X4 12PLY STRL (GAUZE/BANDAGES/DRESSINGS) ×3 IMPLANT
GAUZE XEROFORM 1X8 LF (GAUZE/BANDAGES/DRESSINGS) ×3 IMPLANT
GLOVE SURG ENC MOIS LTX SZ8.5 (GLOVE) ×3 IMPLANT
K-WIRE ACE 1.6X6 (WIRE) ×3
KIT TURNOVER KIT A (KITS) ×3 IMPLANT
KWIRE ACE 1.6X6 (WIRE) ×1 IMPLANT
PACK BASIC (CUSTOM PROCEDURE TRAY) ×3 IMPLANT
PACK ORTHO EXTREMITY (CUSTOM PROCEDURE TRAY) ×3 IMPLANT
PAD CAST 4YDX4 CTTN HI CHSV (CAST SUPPLIES) ×1 IMPLANT
PADDING CAST COTTON 4X4 STRL (CAST SUPPLIES) ×3
PENCIL SMOKE EVACUATOR (MISCELLANEOUS) IMPLANT
PLATE ACE 100DEG 6HOLE (Plate) ×3 IMPLANT
PROTECTOR NERVE ULNAR (MISCELLANEOUS) ×3 IMPLANT
SCREW ACE CAN 4.0 40M (Screw) ×3 IMPLANT
SCREW CORTICAL 3.5MM  12MM (Screw) ×3 IMPLANT
SCREW CORTICAL 3.5MM  16MM (Screw) ×3 IMPLANT
SCREW CORTICAL 3.5MM 12MM (Screw) ×1 IMPLANT
SCREW CORTICAL 3.5MM 14MM (Screw) ×3 IMPLANT
SCREW CORTICAL 3.5MM 16MM (Screw) ×1 IMPLANT
SCREW CORTICAL 3.5MM 18MM (Screw) ×3 IMPLANT
SCREW CORTICAL 3.5MM 22MM (Screw) ×3 IMPLANT
SCREW LAG CANC FT 4X44 (Screw) ×3 IMPLANT
SPLINT FIBERGLASS 5X30 (CAST SUPPLIES) ×3 IMPLANT
STAPLER VISISTAT 35W (STAPLE) ×3 IMPLANT
STRIP CLOSURE SKIN 1/2X4 (GAUZE/BANDAGES/DRESSINGS) IMPLANT
SUT MNCRL AB 4-0 PS2 18 (SUTURE) IMPLANT
SUT VIC AB 1 CT1 27 (SUTURE)
SUT VIC AB 1 CT1 27XBRD ANTBC (SUTURE) IMPLANT
SUT VIC AB 2-0 CT1 27 (SUTURE) ×6
SUT VIC AB 2-0 CT1 TAPERPNT 27 (SUTURE) ×2 IMPLANT
YANKAUER SUCT BULB TIP 10FT TU (MISCELLANEOUS) ×3 IMPLANT

## 2020-09-22 NOTE — Op Note (Signed)
Catherine, Eaton MEDICAL RECORD NO: [id number] ACCOUNT NO: [id number] DATE OF BIRTH: 03-03-1931 FACILITY: [facility] PHYSICIAN: Lubertha Basque. Jerl Santos, MD  Operative Report   DATE OF PROCEDURE: 09/22/2020  PREOPERATIVE DIAGNOSIS:  Right ankle bimalleolar fracture.  POSTOPERATIVE DIAGNOSIS:  Right ankle bimalleolar fracture.  PROCEDURE:  Right ankle open reduction and internal fixation.  ANESTHESIA:  Regional block and sedation.  SURGEON:  Lubertha Basque. Jerl Santos, MD  ASSISTANT:  Elodia Florence, PA  INDICATION FOR PROCEDURE:  The patient is an 87 year old woman who fell at home 2 days ago and suffered widely displaced bimalleolar ankle fracture on the right.  She is offered ORIF in hopes of re-establishing the congruity of her ankle joint and hopes  that it might heal and allow her to weightbear again.  Informed operative consent was obtained after discussion of possible complications including reaction to anesthesia, infection, and wound healing problems.  SUMMARY OF FINDINGS AND PROCEDURE:  Under 2 regional blocks and sedation, I performed bimalleolar ankle ORIF.  We used the Biomet trauma system and stabilized laterally with a 6-hole 1/3 tubular side plate.  On the medial aspect, I placed one cannulated  malleolar screw.  I used fluoroscopy throughout the case to make appropriate intraoperative decisions and read all of these views myself.  Elodia Florence assisted throughout and was invaluable to the completion of the case.  Mostly that, he maintained  reduction while I placed hardware.  He also closed simultaneously to help minimize OR time.  DESCRIPTION OF PROCEDURE:  The patient was taken to the operating suite where some sedation was given in the preanesthesia area.  She was given two blocks by the anesthesiologist to address both aspects of the ankle.  She was positioned supine and  prepped and draped in normal sterile fashion.  After the administration of preoperative IV Kefzol  and appropriate timeout, the right leg was elevated, exsanguinated, and a tourniquet inflated about the calf.  I made a lateral incision along the fibula  with dissection down to this bone.  I removed some fracture hematoma and reduced the fracture in a fairly anatomic fashion.  We then stabilized with a 1/3 tubular 6-hole side plate, which was slightly contoured.  We placed 5 bicortical screws and 1  cancellous screw, which was in the distal most hole.  Her bone was fair at best.  Once this was accomplished and her fibula was out to length, the medial malleolus reduced in an anatomic fashion.  I then fixed this percutaneously with a single cannulated  screw from the same set.  This was a partially threaded cancellous screw measuring 40 mm in length.  Again, I used fluoroscopy to confirm adequate placement of hardware and reduction of her fracture and mortise.  I read these views myself.  We took  films in 2 planes.  The wounds were then irrigated followed by reapproximation of the deep tissues with Vicryl in interrupted fashion.  The tourniquet was deflated, and a small amount of bleeding was easily controlled with some pressure.  Her foot became  pink and warm immediately.  We reapproximated subcutaneous tissues with 2-0 undyed Vicryl and skin with staples.  We placed Adaptic over the wounds along with over a fracture blister.  We then placed a very well-padded posterior splint of plaster with  the ankle in neutral position.  Estimated blood loss and intraoperative fluids as well as accurate tourniquet time can be obtained from anesthesia records.  DISPOSITION:  The patient was taken to  recovery room in stable condition.  PLAN:  Plan is for her to at least stay overnight with potential rehab placement depending on her participation in physical therapy over the next couple of days.   ROH D: 09/22/2020 2:03:26 pm T: 09/22/2020 9:23:00 pm  JOB: [id number]/ [id number]

## 2020-09-22 NOTE — Anesthesia Postprocedure Evaluation (Signed)
Anesthesia Post Note  Patient: [name]  Procedure(s) Performed: RIGHT ANKLE OPEN REDUCTION INTERNAL FIXATION (ORIF) (Right: Ankle)     Patient location during evaluation: PACU Anesthesia Type: Regional and MAC Level of consciousness: awake and alert and oriented Pain management: pain level controlled Vital Signs Assessment: post-procedure vital signs reviewed and stable Respiratory status: spontaneous breathing, nonlabored ventilation, respiratory function stable and patient connected to nasal cannula oxygen Cardiovascular status: blood pressure returned to baseline and stable Postop Assessment: no apparent nausea or vomiting Anesthetic complications: no   No notable events documented.  Last Vitals:  Vitals:   09/22/20 1445 09/22/20 1500  BP: (!) 85/55 (!) 86/49  Pulse: 65 73  Resp: 16 14  Temp:    SpO2: 93% 99%    Last Pain:  Vitals:   09/22/20 1500  TempSrc:   PainSc: Asleep                 [name] A.

## 2020-09-22 NOTE — Anesthesia Preprocedure Evaluation (Addendum)
Anesthesia Evaluation  Patient identified by MRN, date of birth, ID band Patient awake  General Assessment Comment:With cath in 2010 "lasted 3 days"  Reviewed: Allergy & Precautions, NPO status , Patient's Chart, lab work & pertinent test results, reviewed documented beta blocker date and time   History of Anesthesia Complications (+) Emergence Delirium and history of anesthetic complications  Airway Mallampati: II  TM Distance: >3 FB Neck ROM: Full    Dental no notable dental hx. (+) Teeth Intact, Caps, Dental Advisory Given   Pulmonary shortness of breath and with exertion,  pHTN   Pulmonary exam normal breath sounds clear to auscultation       Cardiovascular Exercise Tolerance: Poor hypertension, Pt. on medications + Past MI and +CHF  + dysrhythmias Atrial Fibrillation  Rhythm:Irregular Rate:Normal + Systolic murmurs EKG 2/75/17 Atrial fibrillation, non specific IVCD, lateral infarct, LVH  Echo 07/2019 LVEF 35% with global hypokinesis and moderate LVH.  She also had moderately reduced right-sided function with severe pulmonary hypertension.  PASP was 83 mmHg. EF improved from 15% in 2019   Neuro/Psych PSYCHIATRIC DISORDERS Anxiety Depression  Neuromuscular disease    GI/Hepatic Neg liver ROS, GERD  Medicated and Controlled,diverticulosis   Endo/Other  diabetes, Well Controlled, Type 2, Oral Hypoglycemic Agents  Renal/GU negative Renal ROS  negative genitourinary   Musculoskeletal  (+) Arthritis , Osteoarthritis,  Right ankle Fx   Abdominal   Peds  Hematology  (+) anemia , Eliquis therapy- last dose yesterday   Anesthesia Other Findings   Reproductive/Obstetrics                             Anesthesia Physical  Anesthesia Plan  ASA: 3  Anesthesia Plan: MAC and Regional   Post-op Pain Management:    Induction: Intravenous  PONV Risk Score and Plan: 2 and 4 or greater and  Ondansetron and Treatment may vary due to age or medical condition  Airway Management Planned: Natural Airway and Simple Face Mask  Additional Equipment: None  Intra-op Plan:   Post-operative Plan:   Informed Consent: I have reviewed the patients History and Physical, chart, labs and discussed the procedure including the risks, benefits and alternatives for the proposed anesthesia with the patient or authorized representative who has indicated his/her understanding and acceptance.     Dental advisory given  Plan Discussed with: CRNA  Anesthesia Plan Comments: (See APP note by Durel Salts, FNP  ECHO 04/21: 1. Left ventricular ejection fraction, by estimation, is 35%. The left ventricle has moderate to severely decreased function. The left ventricle demonstrates global hypokinesis. There is moderate left ventricular hypertrophy. Left ventricular diastolic parameters are indeterminate. There is the interventricular septum is flattened in diastole ('D' shaped left ventricle), consistent with right ventricular volume overload.  2. Right ventricular systolic function is moderately reduced. The right ventricular size is moderately enlarged. There is severely elevated pulmonary artery systolic pressure. The estimated right ventricular systolic pressure is 00.1 mmHg.  3. Left atrial size was mildly dilated.  4. Right atrial size was severely dilated.  5. The mitral valve is normal in structure. No evidence of mitral valve regurgitation. No evidence of mitral stenosis.  6. The tricuspid valve is degenerative. Tricuspid valve regurgitation is severe.  7. The aortic valve is grossly normal. Aortic valve regurgitation is not visualized. No aortic stenosis is present.  8. The inferior vena cava is dilated in size with <50% respiratory variability, suggesting right atrial pressure of  15 mmHg.  - Comparison(s): A prior study was performed on 08/03/2017. Prior images  reviewed side by side. RV size has  increased and systolic function  decreased. TR has increased from mild-moderate to severe. LV function  appears slightly improved.   Precedex infusion for sedation.)       Anesthesia Quick Evaluation

## 2020-09-22 NOTE — Op Note (Signed)
[name] [id number] 09/22/2020   PRE-OP DIAGNOSIS: right ankle bimal fracture  POST-OP DIAGNOSIS: same  PROCEDURE: right ankle ORIF  ANESTHESIA: block and MAC  Hessie Dibble   Dictation #:  [id number]

## 2020-09-22 NOTE — Interval H&P Note (Signed)
History and Physical Interval Note:  09/22/2020 12:32 PM  [name]  has presented today for surgery, with the diagnosis of RIGHT ANKLE FRACTURE.  The various methods of treatment have been discussed with the patient and family. After consideration of risks, benefits and other options for treatment, the patient has consented to  Procedure(s): RIGHT ANKLE OPEN REDUCTION INTERNAL FIXATION (ORIF) (Right) as a surgical intervention.  The patient's history has been reviewed, patient examined, no change in status, stable for surgery.  I have reviewed the patient's chart and labs.  Questions were answered to the patient's satisfaction.     [name]

## 2020-09-22 NOTE — Progress Notes (Signed)
Assisted Dr. Foster with right, ultrasound guided, popliteal, adductor canal block. Side rails up, monitors on throughout procedure. See vital signs in flow sheet. Tolerated Procedure well. 

## 2020-09-22 NOTE — H&P (Signed)
[name] is an 87 y.o. female.   Chief Complaint: right ankle fracture HPI: Patient fell at home 2 days ago and fractured her ankle. She is in terrible pain.   X ray: Three views of the right ankle show displaced bimalleolar fracture.  Past Medical History:  Diagnosis Date   Acute combined systolic and diastolic heart failure (HCC) 05/02/2017   Anemia    Anxiety    Arthritis    "knees" (06/18/2015)   Atrial fibrillation with RVR (HCC) 06/18/2015   h/o sig bleed on coumadin   Cardiogenic shock 05/02/2017   Complication of anesthesia 2010   "w/cardiac cath; got into a psychotic state for ~ 3 days; got me out w/Valium"   Depression    "I have it off and on; not as often as I've gotten older" (06/18/2015)   Diverticulosis    descending and sigmoid colon--Dr. Jarold Motto   DJD (degenerative joint disease) of knee    left   Dyspnea    with exertion and in morning mostly when wakes up    E coli infection    History of   GERD (gastroesophageal reflux disease)    History of blood transfusion    History of Clostridium difficile colitis    IBS (irritable bowel syndrome)    Insomnia     off chronic ambien 5mg  as of 10/11, rare/episodic use since then  DCM/CHF   LBBB (left bundle branch block)    Lymphocytic colitis 09/27/2016   Moderate to severe pulmonary hypertension (HCC) 11/26/2019   Nonischemic cardiomyopathy (HCC)    normal coronaries, EF 15-20% 04/2008, EF 50-55% 2015   Osteoporosis    on DXA 05/2010   Positive PPD    Retroperitoneal bleed    Sciatica    Right   Urinary incontinence    Occassionally    Past Surgical History:  Procedure Laterality Date   ANTERIOR APPROACH HEMI HIP ARTHROPLASTY Left 08/05/2019   Procedure: ANTERIOR APPROACH HEMI HIP ARTHROPLASTY;  Surgeon: 08/07/2019, MD;  Location: [facility];  Service: Orthopedics;  Laterality: Left;   BIOPSY  11/22/2017   Procedure: BIOPSY;  Surgeon: 11/24/2017, MD;  [facility]: [facility];  Service: Endoscopy;;    CARDIAC CATHETERIZATION  2010   CARDIOVERSION N/A 06/19/2015   Procedure: CARDIOVERSION;  Surgeon: 08/19/2015, MD;  Location: [facility] ENDOSCOPY;  Service: Cardiovascular;  Laterality: N/A;   CATARACT EXTRACTION W/ INTRAOCULAR LENS  IMPLANT, BILATERAL Bilateral    COLONOSCOPY WITH PROPOFOL N/A 11/22/2017   Procedure: COLONOSCOPY WITH PROPOFOL;  Surgeon: 11/24/2017, MD;  Location[facility];  Service: Endoscopy;  Laterality: N/A;   DILATION AND CURETTAGE OF UTERUS     TEE WITHOUT CARDIOVERSION N/A 06/19/2015   Procedure: TRANSESOPHAGEAL ECHOCARDIOGRAM (TEE);  Surgeon: 08/19/2015, MD;  Location: [facility] ENDOSCOPY;  Service: Cardiovascular;  Laterality: N/A;   TOTAL KNEE ARTHROPLASTY Left 04/25/2017   Procedure: TOTAL KNEE ARTHROPLASTY;  Surgeon: 04/27/2017, MD;  Location: [facility] OR;  Service: Orthopedics;  Laterality: Left;   TOTAL KNEE ARTHROPLASTY Right 02/18/2020   Procedure: RIGHT TOTAL KNEE ARTHROPLASTY;  Surgeon: 13/12/2019, MD;  [facility];  Service: Orthopedics;  Laterality: Right;   TOTAL KNEE ARTHROPLASTY Right 06/16/2020   Procedure: RIGHT RETINACULAR REPAIR;  Surgeon: 08/16/2020, MD;  [facility];  Service: Orthopedics;  Laterality: Right;   TOTAL KNEE REVISION Left 12/03/2019   Procedure: LEFT TOTAL KNEE REVISION  OF POYLY EXCHANGE;  Surgeon: 12/05/2019, MD;  [facility];  Service: Orthopedics;  Laterality: Left;   VAGINAL HYSTERECTOMY  1979   ovaries intact    Family History  Problem Relation Age of Onset   Colon cancer Father        possible colon cancer   Other Father        esophagus tumor?   Tuberculosis Mother    Breast cancer Neg Hx    Stomach cancer Neg Hx    Pancreatic cancer Neg Hx    Social History:  reports that she has never smoked. She has never used smokeless tobacco. She reports that she does not drink alcohol and does not use drugs.  Allergies:  Allergies  Allergen Reactions   Ace Inhibitors Cough   Warfarin Sodium Other  (See Comments)    DOSE RELATED PHARMACOLOGIC EFFECT "bleed out"   Famotidine Other (See Comments)    GI upset   Codeine Other (See Comments)    sedation   Delsym [Dextromethorphan Polistirex Er] Other (See Comments)    dizziness   Lactose Intolerance (Gi) Diarrhea   Lasix [Furosemide] Diarrhea    Oral Lasix gives her diarrhea, tolerates IV Rx   Phenylephrine Palpitations and Other (See Comments)    Nasal spray- "likely increase in nasal congestion".    Sulfamethoxazole-Trimethoprim Nausea And Vomiting    GI intolerance.    Medications Prior to Admission  Medication Sig Dispense Refill   acetaminophen (TYLENOL) 500 MG tablet Take 1,000 mg by mouth in the morning and at bedtime.     calcium carbonate (TUMS - DOSED IN MG ELEMENTAL CALCIUM) 500 MG chewable tablet Chew 500 mg by mouth 2 (two) times daily as needed for indigestion or heartburn.     cholecalciferol (VITAMIN D3) 25 MCG (1000 UNIT) tablet Take 1,000 Units by mouth in the morning.     dapagliflozin propanediol (FARXIGA) 10 MG TABS tablet Take 5 mg by mouth in the morning.     digoxin (LANOXIN) 0.125 MG tablet TAKE 1/2 TABLET (0.0625 MG) BY MOUTH DAILY. (Patient taking differently: Take 0.0625 mg by mouth in the morning.) 15 tablet 6   ELIQUIS 2.5 MG TABS tablet TAKE 1 TABLET BY MOUTH TWICE A DAY (Patient taking differently: Take 2.5 mg by mouth 2 (two) times daily.) 180 tablet 1   loperamide (IMODIUM) 2 MG capsule Take 2 mg by mouth 3 (three) times daily as needed for diarrhea or loose stools.      losartan (COZAAR) 25 MG tablet Take 1 tablet (25 mg total) by mouth daily. 90 tablet 3   Menthol-Methyl Salicylate (SALONPAS PAIN RELIEF PATCH EX) Apply 2 patches topically daily as needed (pain).     metoprolol succinate (TOPROL-XL) 100 MG 24 hr tablet Take 1 tablet (100 mg total) by mouth daily. Take with or immediately following a meal. 90 tablet 3   Polyethyl Glycol-Propyl Glycol (SYSTANE OP) Place 1-2 drops into the left eye 3  (three) times daily as needed (dryness).     potassium chloride SA (KLOR-CON) 20 MEQ tablet TAKE 2 TABLETS (40 MEQ TOTAL) BY MOUTH DAILY. (Patient taking differently: Take 40 mEq by mouth in the morning.) 60 tablet 3   predniSONE (DELTASONE) 5 MG tablet Take 1 tablet (5 mg total) by mouth daily with breakfast.     sertraline (ZOLOFT) 50 MG tablet Take 2 tablets (100 mg total) by mouth daily. (Patient taking differently: Take 100 mg by mouth in the morning.)     torsemide (DEMADEX) 20 MG tablet Take 1 tablet (20 mg total) by mouth  daily as needed. if 3 pound weight gain overnight or 5 pounds in a week. (Patient taking differently: Take 20 mg by mouth daily as needed (if 3 pound weight gain overnight or 5 pounds in a week.).) 30 tablet 5    Results for orders placed or performed during the hospital encounter of 09/22/20 (from the past 48 hour(s))  SARS Coronavirus 2 by RT PCR (hospital order, performed in [facility] hospital lab) Nasopharyngeal Nasopharyngeal Swab     Status: None   Collection Time: 09/22/20 10:15 AM   Specimen: Nasopharyngeal Swab  Result Value Ref Range   SARS Coronavirus 2 NEGATIVE NEGATIVE    Comment: (NOTE) SARS-CoV-2 target nucleic acids are NOT DETECTED.  The SARS-CoV-2 RNA is generally detectable in upper and lower respiratory specimens during the acute phase of infection. The lowest concentration of SARS-CoV-2 viral copies this assay can detect is 250 copies / mL. A negative result does not preclude SARS-CoV-2 infection and should not be used as the sole basis for treatment or other patient management decisions.  A negative result may occur with improper specimen collection / handling, submission of specimen other than nasopharyngeal swab, presence of viral mutation(s) within the areas targeted by this assay, and inadequate number of viral copies (<250 copies / mL). A negative result must be combined with clinical observations, patient history, and epidemiological  information.  Fact Sheet for Patients:   BoilerBrush.com.cy  Fact Sheet for Healthcare Providers: https://pope.com/  This test is not yet approved or  cleared by the [place] FDA and has been authorized for detection and/or diagnosis of SARS-CoV-2 by FDA under an Emergency Use Authorization (EUA).  This EUA will remain in effect (meaning this test can be used) for the duration of the COVID-19 declaration under Section 564(b)(1) of the Act, 21 U.S.C. section 360bbb-3(b)(1), unless the authorization is terminated or revoked sooner.  Performed at [facility], [address], [place], [place] [address]   Basic metabolic panel per protocol     Status: None   Collection Time: 09/22/20 11:10 AM  Result Value Ref Range   Sodium 136 135 - 145 mmol/L   Potassium 4.2 3.5 - 5.1 mmol/L   Chloride 101 98 - 111 mmol/L   CO2 26 22 - 32 mmol/L   Glucose, Bld 95 70 - 99 mg/dL    Comment: Glucose reference range applies only to samples taken after fasting for at least 8 hours.   BUN 16 8 - 23 mg/dL   Creatinine, Ser 0.35 0.44 - 1.00 mg/dL   Calcium 9.3 8.9 - 59.7 mg/dL   GFR, Estimated >41 >63 mL/min    Comment: (NOTE) Calculated using the CKD-EPI Creatinine Equation (2021)    Anion gap 9 5 - 15    Comment: Performed at [facility], [address] [place], [place] [address]  CBC per protocol     Status: None   Collection Time: 09/22/20 11:10 AM  Result Value Ref Range   WBC 7.3 4.0 - 10.5 K/uL   RBC 3.93 3.87 - 5.11 MIL/uL   Hemoglobin 12.1 12.0 - 15.0 g/dL   HCT 46.8 03.2 - 12.2 %   MCV 95.7 80.0 - 100.0 fL   MCH 30.8 26.0 - 34.0 pg   MCHC 32.2 30.0 - 36.0 g/dL   RDW 48.2 50.0 - 37.0 %   Platelets 169 150 - 400 K/uL   nRBC 0.0 0.0 - 0.2 %    Comment: Performed at [facility], [address], [place], [place] [address]   No results found.  Review of Systems   Musculoskeletal:  Positive for arthralgias.       Right ankle pain  All other systems reviewed and are negative.  Blood pressure 117/67, pulse 93, temperature 98.4 F (36.9 C), temperature source Oral, resp. rate 19, height 5\' 5"  (1.651 m), weight 64.9 kg, SpO2 98 %. Physical Exam Constitutional:      Appearance: Normal appearance.  HENT:     Mouth/Throat:     Pharynx: Oropharynx is clear.  Eyes:     Extraocular Movements: Extraocular movements intact.  Pulmonary:     Effort: Pulmonary effort is normal.  Abdominal:     Palpations: Abdomen is soft.  Musculoskeletal:     Cervical back: Normal range of motion.     Comments: Right ankle is swollen diffuse pain to palpation. Small fracture blisters are present. Limited ROM.  Skin:    General: Skin is warm and dry.  Neurological:     General: No focal deficit present.     Mental Status: She is alert and oriented to person, place, and time.  Psychiatric:        Mood and Affect: Mood normal.        Behavior: Behavior normal.        Thought Content: Thought content normal.        Judgment: Judgment normal.     Assessment/Plan Right ankle fracture.  Ahmyah has a displaced ankle fracture. With out ORIF she would not be able to walk again. We reviewed the risk of anesthesia, infection, and bleeding related to Right ankle ORIF. She is in agreement and would like to precede.  Remigio Eisenmenger Sydnie Sigmund, PA-C 09/22/2020, 12:12 PM

## 2020-09-22 NOTE — Anesthesia Procedure Notes (Addendum)
Anesthesia Regional Block: Popliteal block   Pre-Anesthetic Checklist: , timeout performed,  Correct Patient, Correct Site, Correct Laterality,  Correct Procedure, Correct Position, site marked,  Risks and benefits discussed,  Surgical consent,  Pre-op evaluation,  At surgeon's request and post-op pain management  Laterality: Right  Prep: chloraprep       Needles:  Injection technique: Single-shot  Needle Type: Echogenic Stimulator Needle     Needle Length: 10cm  Needle Gauge: 21   Needle insertion depth: 6 cm   Additional Needles:   Procedures:,,,, ultrasound used (permanent image in chart),,    Narrative:  Start time: 09/22/2020 11:51 AM End time: 09/22/2020 11:56 AM Injection made incrementally with aspirations every 5 mL.  Performed by: Personally  Anesthesiologist: Mal Amabile, MD  Additional Notes: Timeout performed. Patient sedated. Relevant anatomy ID'd using [place]. Incremental 2-108ml injection of LA with frequent aspiration. Patient tolerated procedure well.    Right Popliteal Block

## 2020-09-22 NOTE — Anesthesia Procedure Notes (Signed)
Procedure Name: MAC Date/Time: 09/22/2020 1:04 PM Performed by: Sharlette Dense, CRNA Oxygen Delivery Method: Simple face mask

## 2020-09-22 NOTE — Anesthesia Procedure Notes (Addendum)
  Anesthesia Regional Block: Adductor canal block   Pre-Anesthetic Checklist: , timeout performed,  Correct Patient, Correct Site, Correct Laterality,  Correct Procedure, Correct Position, site marked,  Risks and benefits discussed,  Surgical consent,  Pre-op evaluation,  At surgeon's request and post-op pain management  Laterality: Right  Prep: chloraprep       Needles:  Injection technique: Single-shot  Needle Type: Echogenic Stimulator Needle     Needle Length: 10cm  Needle Gauge: 21     Additional Needles:   Procedures:,,,, ultrasound used (permanent image in chart),,    Narrative:  Start time: 09/22/2020 11:56 AM End time: 09/22/2020 12:01 PM Injection made incrementally with aspirations every 5 mL.  Performed by: Personally  Anesthesiologist: Mal Amabile, MD  Additional Notes: Timeout performed. Patient sedated. Relevant anatomy ID'd using [place]. Incremental 2-31ml injection of LA with frequent aspiration. Patient tolerated procedure well.

## 2020-09-22 NOTE — Transfer of Care (Signed)
Immediate Anesthesia Transfer of Care Note  Patient: [name]  Procedure(s) Performed: RIGHT ANKLE OPEN REDUCTION INTERNAL FIXATION (ORIF) (Right: Ankle)  Patient Location: PACU  Anesthesia Type:Regional  Level of Consciousness: awake, alert  and oriented  Airway & Oxygen Therapy: Patient Spontanous Breathing and Patient connected to nasal cannula oxygen  Post-op Assessment: Report given to RN and Post -op Vital signs reviewed and stable  Post vital signs: Reviewed and stable  Last Vitals:  Vitals Value Taken Time  BP 88/47 09/22/20 1418  Temp    Pulse 70 09/22/20 1419  Resp 18 09/22/20 1419  SpO2 95 % 09/22/20 1419  Vitals shown include unvalidated device data.  Last Pain:  Vitals:   09/22/20 1015  TempSrc: Oral         Complications: No notable events documented.

## 2020-09-23 ENCOUNTER — Encounter (HOSPITAL_COMMUNITY): Payer: Self-pay | Admitting: Orthopaedic Surgery

## 2020-09-23 ENCOUNTER — Observation Stay (HOSPITAL_COMMUNITY): Payer: Medicare Other

## 2020-09-23 DIAGNOSIS — Z91040 Latex allergy status: Secondary | ICD-10-CM | POA: Diagnosis not present

## 2020-09-23 DIAGNOSIS — Z96653 Presence of artificial knee joint, bilateral: Secondary | ICD-10-CM | POA: Diagnosis not present

## 2020-09-23 DIAGNOSIS — S82841A Displaced bimalleolar fracture of right lower leg, initial encounter for closed fracture: Secondary | ICD-10-CM | POA: Diagnosis not present

## 2020-09-23 DIAGNOSIS — M19011 Primary osteoarthritis, right shoulder: Secondary | ICD-10-CM | POA: Diagnosis not present

## 2020-09-23 DIAGNOSIS — I5041 Acute combined systolic (congestive) and diastolic (congestive) heart failure: Secondary | ICD-10-CM | POA: Diagnosis not present

## 2020-09-23 DIAGNOSIS — Z7901 Long term (current) use of anticoagulants: Secondary | ICD-10-CM | POA: Diagnosis not present

## 2020-09-23 DIAGNOSIS — Z20822 Contact with and (suspected) exposure to covid-19: Secondary | ICD-10-CM | POA: Diagnosis not present

## 2020-09-23 LAB — HEMOGLOBIN A1C
Hgb A1c MFr Bld: 5.4 % (ref 4.8–5.6)
Mean Plasma Glucose: 108 mg/dL

## 2020-09-23 MED ORDER — METHOCARBAMOL 500 MG PO TABS
500.0000 mg | ORAL_TABLET | Freq: Four times a day (QID) | ORAL | Status: DC | PRN
  Administered 2020-09-23 – 2020-09-24 (×2): 500 mg via ORAL
  Filled 2020-09-23 (×2): qty 1

## 2020-09-23 NOTE — Plan of Care (Signed)
Plan of care reviewed and discussed with the patient. 

## 2020-09-23 NOTE — Evaluation (Signed)
Physical Therapy Evaluation Patient Details Name: [name] MRN: [id number] DOB: 06-13-1930 Today's Date: 09/23/2020   History of Present Illness  pt is an 87 y.o. female s/p Rt ankle ORIF on 09/22/20 with PMH significant for heart failure, anemia, anxiety, a-fib with RVR, depression, GERD, cardiogenic shock (2019), osteoporosis, sciatica, Lt hemi hip arthroplasty (2021), and B TKA ( with Lt revision)  Clinical Impression  Pt is an 87 y.o. female s/p Rt ankle ORIF on 09/22/20 after a fall. Pt required MIN A +2 for  safety/stability with sit to stand and simulation of stand pivot transfer with cues to maintain NWB precautions. Pt demonstrates weakness of L LE limiting standing tolerance and safety with transfers. Pt lives with her husband and her daughter is only able to check in intermittently. Recommend SNF at this time as pt requires increased assist +2 with all mobility to maintain safety due to weakness in L LE and NWB precautions on R LE. Pt will benefit from continued skilled physical therapy in order to address listed impairments, maximize functional mobility,and assess carryover of NWB precautions.     Follow Up Recommendations SNF    Equipment Recommendations  None recommended by PT    Recommendations for Other Services       Precautions / Restrictions Restrictions Weight Bearing Restrictions: Yes RLE Weight Bearing: Non weight bearing      Mobility  Bed Mobility               General bed mobility comments: Pt OOB in recliner upon entry    Transfers Overall transfer level: Needs assistance Equipment used: Rolling walker (2 wheeled);2 person hand held assist Transfers: Sit to/from Stand;Stand Pivot Transfers Sit to Stand: Min assist;+2 physical assistance;+2 safety/equipment Stand pivot transfers: Min assist;+2 physical assistance;+2 safety/equipment       General transfer comment: MIN A +2 for stability with sit to stand from recliner with cues to maintain  NWB precautions on R LE. Pt able to perform active hip flexion of R LE to keep leg off of the ground with cuing. Pt did not perform full stand pivot to different surface, but simulated pivoting on L LE with use of RW and +2 assist for stability/pt safety in standing. Pt sliding Lt foot laterally to simulate transfer. Pt standing tolerance less than 30s before verbalizing need to sit.  Ambulation/Gait                Stairs            Wheelchair Mobility    Modified Rankin (Stroke Patients Only)       Balance                                             Pertinent Vitals/Pain Pain Assessment: 0-10 Pain Score: 4  Pain Location: Rt ankle Pain Descriptors / Indicators: Burning;Sore Pain Intervention(s): Limited activity within patient's tolerance;Monitored during session;Repositioned    Home Living Family/patient expects to be discharged to:: Private residence Living Arrangements: Spouse/significant other Available Help at Discharge: Family;Available 24 hours/day Type of Home: House Home Access: Level entry     Home Layout: Able to live on main level with bedroom/bathroom;Two level Home Equipment: Wheelchair - manual;Bedside commode;Shower seat;Walker - 4 wheels;Walker - 2 wheels;Grab bars - tub/shower;Grab bars - toilet Additional Comments: Pt lives with her husband and has daughter can check in intermittently. Pt  has threshold she has to step up/over to enter house. She reports that she tripped over threshold and fell which caused her R ankle fx.    Prior Function Level of Independence: Needs assistance         Comments: use RW for mobility     Hand Dominance   Dominant Hand: Right    Extremity/Trunk Assessment   Upper Extremity Assessment Upper Extremity Assessment: Generalized weakness    Lower Extremity Assessment Lower Extremity Assessment: LLE deficits/detail LLE Deficits / Details: Pt unable to perform full Lt active knee  flexion or SLR due to LE weakness. LLE Coordination: WNL    Cervical / Trunk Assessment Cervical / Trunk Assessment: Normal  Communication   Communication: No difficulties  Cognition Arousal/Alertness: Awake/alert Behavior During Therapy: WFL for tasks assessed/performed Overall Cognitive Status: Within Functional Limits for tasks assessed                                        General Comments      Exercises     Assessment/Plan    PT Assessment Patient needs continued PT services  PT Problem List Decreased strength;Decreased range of motion;Decreased activity tolerance;Decreased balance;Decreased mobility;Decreased knowledge of use of DME;Pain       PT Treatment Interventions DME instruction;Gait training;Functional mobility training;Therapeutic activities;Therapeutic exercise;Patient/family education;Wheelchair mobility training    PT Goals (Current goals can be found in the Care Plan section)  Acute Rehab PT Goals Patient Stated Goal: get moving better PT Goal Formulation: With patient/family Time For Goal Achievement: 10/07/20 Potential to Achieve Goals: Good    Frequency Min 3X/week   Barriers to discharge        Co-evaluation               AM-PAC PT "6 Clicks" Mobility  Outcome Measure Help needed turning from your back to your side while in a flat bed without using bedrails?: A Little Help needed moving from lying on your back to sitting on the side of a flat bed without using bedrails?: A Little Help needed moving to and from a bed to a chair (including a wheelchair)?: A Lot Help needed standing up from a chair using your arms (e.g., wheelchair or bedside chair)?: A Lot Help needed to walk in hospital room?: Total Help needed climbing 3-5 steps with a railing? : Total 6 Click Score: 12    End of Session Equipment Utilized During Treatment: Gait belt Activity Tolerance: Patient tolerated treatment well Patient left: in chair;with  call bell/phone within reach;with chair alarm set;with family/visitor present Nurse Communication: Mobility status;Weight bearing status PT Visit Diagnosis: Unsteadiness on feet (R26.81);History of falling (Z91.81);Difficulty in walking, not elsewhere classified (R26.2);Muscle weakness (generalized) (M62.81)    Time: 1120-1140 PT Time Calculation (min) (ACUTE ONLY): 20 min   Charges:   PT Evaluation $PT Eval Low Complexity: 1 Low         Lyman Speller PT, DPT  Acute Rehabilitation Services  Office [phone]

## 2020-09-23 NOTE — TOC Initial Note (Signed)
Transition of Care [facility]) - Initial/Assessment Note    Patient Details  Name: [name] MRN: [id number] Date of Birth: 08-25-1930  Transition of Care [facility]) CM/SW Contact:    Lennart Pall, LCSW Phone Number: 09/23/2020, 11:43 AM  Clinical Narrative:                 Met with pt and daughter this morning to review dc planning needs.  Both aware that recommendation has been made for pt to have short term SNF and pt is reluctantly agreeable.  She understands that this will allow for daily therapy vs 2-3 days/ wk with [facility].   Daughter and family feel this is the best plan.  Awaiting PT eval and will then begin bed search and insurance authorization.  Expected Discharge Plan: Skilled Nursing Facility Barriers to Discharge: Continued Medical Work up   Patient Goals and CMS Choice Patient states their goals for this hospitalization and ongoing recovery are:: return home following rehab CMS Medicare.gov Compare Post Acute Care list provided to:: Patient Choice offered to / list presented to : Patient  Expected Discharge Plan and Services Expected Discharge Plan: [place] In-house Referral: Clinical Social Work   Post Acute Care Choice: [place] Living arrangements for the past 2 months: [place]                 DME Arranged: N/A DME Agency: NA                  Prior Living Arrangements/Services Living arrangements for the past 2 months: Single Family Home Lives with:: Spouse Patient language and need for interpreter reviewed:: Yes Do you feel safe going back to the place where you live?: Yes      Need for Family Participation in Patient Care: Yes (Comment) Care giver support system in place?: Yes (comment)   Criminal Activity/Legal Involvement Pertinent to Current Situation/Hospitalization: No - Comment as needed  Activities of Daily Living Home Assistive Devices/Equipment: Eyeglasses, Walker (specify type), Blood pressure cuff,  Bedside commode/3-in-1, Shower chair without back, Wheelchair ADL Screening (condition at time of admission) Patient's cognitive ability adequate to safely complete daily activities?: Yes Is the patient deaf or have difficulty hearing?: No Does the patient have difficulty seeing, even when wearing glasses/contacts?: No Does the patient have difficulty concentrating, remembering, or making decisions?: No Patient able to express need for assistance with ADLs?: Yes Does the patient have difficulty dressing or bathing?: No Independently performs ADLs?: No Communication: Independent Dressing (OT): Independent Grooming: Independent Feeding: Independent Bathing: Needs assistance Toileting: Independent In/Out Bed: Independent Walks in Home: Independent Does the patient have difficulty walking or climbing stairs?: Yes Weakness of Legs: Both Weakness of Arms/Hands: None  Permission Sought/Granted Permission sought to share information with : Family Supports Permission granted to share information with : Yes, Verbal Permission Granted  Share Information with NAME: Domenica Fail     Permission granted to share info w Relationship: daughter  Permission granted to share info w Contact Information: [phone]  Emotional Assessment Appearance:: Appears stated age Attitude/Demeanor/Rapport: Self-Confident, Gracious, Engaged Affect (typically observed): Accepting, Pleasant Orientation: : Oriented to Self, Oriented to Place, Oriented to  Time, Oriented to Situation Alcohol / Substance Use: Not Applicable Psych Involvement: No (comment)  Admission diagnosis:  Closed right ankle fracture [S82.891A] Patient Active Problem List   Diagnosis Date Noted   Closed right ankle fracture 09/22/2020   Ankle fracture, right 09/22/2020   Recurrent dislocation of patella, right knee  06/16/2020   Primary osteoarthritis of right knee 02/18/2020   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  12/03/2019   Left knee pain 12/03/2019   Moderate to severe pulmonary hypertension ([place]) 11/26/2019   Tremor of both hands 09/29/2019   Dysuria 08/06/2019   Severe tricuspid regurgitation    S/P hip hemiarthroplasty 08/04/2019   Ear symptom 12/30/2018   Chronic diarrhea 09/07/2017   Hoarseness of voice 09/07/2017   Total knee replacement status, left 04/25/17 05/06/2017   Acute combined systolic and diastolic heart failure ([place]) 05/02/2017   Primary osteoarthritis of left knee 04/25/2017   DJD (degenerative joint disease) 04/25/2017   Healthcare maintenance 02/08/2017   Vitamin D deficiency 02/08/2017   Microscopic colitis 09/27/2016   IBS (irritable bowel syndrome) 08/31/2016   At risk for falling 01/07/2016   Chronic combined systolic and diastolic heart failure (HCC)    Rectus sheath hematoma- no anticoagulation    Atrial fibrillation with RVR- CHADs VASc=4 06/18/2015   Diarrhea 06/12/2015   Advance care planning 10/23/2014   Leg edema 06/27/2012   Medicare annual wellness visit, subsequent 03/27/2012   Depression 03/05/2012   EDEMA 06/07/2010   Osteoporosis 05/23/2010   KNEE PAIN, LEFT 03/02/2010   LBBB (left bundle branch block) 10/28/2008   POST MI SEPTAL DEFECT 06/24/2008   Cough 06/24/2008   Nonischemic cardiomyopathy ([place]) 05/30/2008   ATRIAL FIBRILLATION 05/30/2008   HYPONATREMIA, HX OF 05/30/2008   PCP:  [name], MD Pharmacy:   [facility], [place] - [place], [place] [address], [place] [address] Phone: [phone] Fax: [phone]  [id number] [place] [address]: [phone]: [phone] [facility] [address]: [phone]: [phone]    Social Determinants of Health (SDOH) Interventions    Readmission Risk Interventions Readmission Risk  Prevention Plan 08/06/2019  Transportation Screening Complete  PCP or Specialist Appt within 5-7 Days Complete  Home Care Screening Complete  Medication Review (RN CM) Complete  Some recent data might be hidden

## 2020-09-23 NOTE — NC FL2 (Signed)
[place] MEDICAID FL2 LEVEL OF CARE SCREENING TOOL     IDENTIFICATION  Patient Name: [name] Birthdate: 03-Apr-1931 Sex: female Admission Date (Current Location): 09/22/2020  [facility] and [place] Number:  Producer, television/film/video and Address:  [facility],  [address] [place], [name] [address]      Provider Number: [id number]  Attending Physician Name and Address:  [name], MD  Relative Name and Phone Number:  daughter, Fidel Levy [phone]    Current Level of Care: Hospital Recommended Level of Care: Skilled Nursing Facility Prior Approval Number:    Date Approved/Denied:   PASRR Number: [id number]  Discharge Plan: SNF    Current Diagnoses: Patient Active Problem List   Diagnosis Date Noted   Closed right ankle fracture 09/22/2020   Ankle fracture, right 09/22/2020   Recurrent dislocation of patella, right knee 06/16/2020   Primary osteoarthritis of right knee 02/18/2020   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 12/03/2019   Left knee pain 12/03/2019   Moderate to severe pulmonary hypertension (HCC) 11/26/2019   Tremor of both hands 09/29/2019   Dysuria 08/06/2019   Severe tricuspid regurgitation    S/P hip hemiarthroplasty 08/04/2019   Ear symptom 12/30/2018   Chronic diarrhea 09/07/2017   Hoarseness of voice 09/07/2017   Total knee replacement status, left 04/25/17 05/06/2017   Acute combined systolic and diastolic heart failure (HCC) 05/02/2017   Primary osteoarthritis of left knee 04/25/2017   DJD (degenerative joint disease) 04/25/2017   Healthcare maintenance 02/08/2017   Vitamin D deficiency 02/08/2017   Microscopic colitis 09/27/2016   IBS (irritable bowel syndrome) 08/31/2016   At risk for falling 01/07/2016   Chronic combined systolic and diastolic heart failure (HCC)    Rectus sheath hematoma- no anticoagulation    Atrial fibrillation with RVR- CHADs VASc=4 06/18/2015   Diarrhea 06/12/2015    Advance care planning 10/23/2014   Leg edema 06/27/2012   Medicare annual wellness visit, subsequent 03/27/2012   Depression 03/05/2012   EDEMA 06/07/2010   Osteoporosis 05/23/2010   KNEE PAIN, LEFT 03/02/2010   LBBB (left bundle branch block) 10/28/2008   POST MI SEPTAL DEFECT 06/24/2008   Cough 06/24/2008   Nonischemic cardiomyopathy (HCC) 05/30/2008   ATRIAL FIBRILLATION 05/30/2008   HYPONATREMIA, HX OF 05/30/2008    Orientation RESPIRATION BLADDER Height & Weight     Self, Time, Situation, Place  Normal Continent Weight: 143 lb (64.9 kg) Height:  5\' 5"  (165.1 cm)  BEHAVIORAL SYMPTOMS/MOOD NEUROLOGICAL BOWEL NUTRITION STATUS      Continent    AMBULATORY STATUS COMMUNICATION OF NEEDS Skin   Limited Assist Verbally Surgical wounds                       Personal Care Assistance Level of Assistance  Bathing, Dressing Bathing Assistance: Limited assistance   Dressing Assistance: Limited assistance     Functional Limitations Info             SPECIAL CARE FACTORS FREQUENCY  PT (By licensed PT), OT (By licensed OT)     PT Frequency: 5x/wk OT Frequency: 5x/wk            Contractures Contractures Info: Not present    Additional Factors Info  Code Status, Allergies Code Status Info: Full Allergies Info: see MAR           Current Medications (09/23/2020):  This is the current hospital active medication list Current Facility-Administered Medications  Medication Dose  Route Frequency Provider Last Rate Last Admin   acetaminophen (TYLENOL) tablet 325-650 mg  325-650 mg Oral Q6H PRN Elodia Florence, PA-C       apixaban Everlene Balls) tablet 2.5 mg  2.5 mg Oral BID Elodia Florence, PA-C   2.5 mg at 09/23/20 1025   dapagliflozin propanediol (FARXIGA) tablet 5 mg  5 mg Oral q AM Elodia Florence, PA-C       digoxin (LANOXIN) tablet 0.0625 mg  0.0625 mg Oral Daily Elodia Florence, PA-C   0.0625 mg at 09/23/20 1028   diphenhydrAMINE (BENADRYL) 12.5 MG/5ML elixir 12.5-25 mg  12.5-25  mg Oral Q4H PRN Elodia Florence, PA-C       docusate sodium (COLACE) capsule 100 mg  100 mg Oral BID Elodia Florence, PA-C   100 mg at 09/22/20 2243   lactated ringers infusion   Intravenous Continuous Elodia Florence, PA-C 50 mL/hr at 09/23/20 1400 Infusion Verify at 09/23/20 1400   loperamide (IMODIUM) capsule 2 mg  2 mg Oral TID PRN Elodia Florence, PA-C       losartan (COZAAR) tablet 25 mg  25 mg Oral Daily Elodia Florence, PA-C       metoCLOPramide (REGLAN) tablet 5-10 mg  5-10 mg Oral Q8H PRN Elodia Florence, PA-C       Or   metoCLOPramide (REGLAN) injection 5-10 mg  5-10 mg Intravenous Q8H PRN Elodia Florence, PA-C       metoprolol succinate (TOPROL-XL) 24 hr tablet 100 mg  100 mg Oral Daily Elodia Florence, PA-C       ondansetron [facility]) tablet 4 mg  4 mg Oral Q6H PRN Elodia Florence, PA-C       Or   ondansetron (ZOFRAN) injection 4 mg  4 mg Intravenous Q6H PRN Elodia Florence, PA-C       polyethylene glycol 0.4% and propylene glycol 0.3% (SYSTANE) ophthalmic gel   Left Eye TID PRN Elodia Florence, PA-C       potassium chloride SA (KLOR-CON) CR tablet 40 mEq  40 mEq Oral Daily Elodia Florence, PA-C   40 mEq at 09/23/20 1025   sertraline (ZOLOFT) tablet 100 mg  100 mg Oral Daily Elodia Florence, PA-C   100 mg at 09/23/20 1026   torsemide (DEMADEX) tablet 20 mg  20 mg Oral Daily PRN Elodia Florence, PA-C       traMADol Janean Sark) tablet 50 mg  50 mg Oral Q6H PRN Elodia Florence, PA-C   50 mg at 09/23/20 1215     Discharge Medications: Please see discharge summary for a list of discharge medications.  Relevant Imaging Results:  Relevant Lab Results:   Additional Information SS#- [id number]; vaccine and booster  Teila Skalsky, LCSW

## 2020-09-23 NOTE — Progress Notes (Signed)
Subjective: 1 Day Post-Op Procedure(s) (LRB): RIGHT ANKLE OPEN REDUCTION INTERNAL FIXATION (ORIF) (Right)  Patient doing better this morning. Still has some pain and weakness. She is wanting to go home but understands she may need to go to SNF.  Activity level:  nonweightbearing right leg Diet tolerance:  ok Voiding:  ok Patient reports pain as mild.    Objective: Vital signs in last 24 hours: Temp:  [97.6 F (36.4 C)-99.4 F (37.4 C)] 98 F (36.7 C) (06/15 0442) Pulse Rate:  [49-155] 67 (06/15 0442) Resp:  [12-35] 16 (06/15 0442) BP: (85-149)/(48-98) 119/59 (06/15 0442) SpO2:  [91 %-100 %] 100 % (06/15 0442) Weight:  [64.9 kg] 64.9 kg (06/14 1015)  Labs: Recent Labs    09/22/20 1110  HGB 12.1   Recent Labs    09/22/20 1110  WBC 7.3  RBC 3.93  HCT 37.6  PLT 169   Recent Labs    09/22/20 1110  NA 136  K 4.2  CL 101  CO2 26  BUN 16  CREATININE 0.80  GLUCOSE 95  CALCIUM 9.3   No results for input(s): LABPT, INR in the last 72 hours.  Physical Exam:  Neurologically intact ABD soft Neurovascular intact Sensation intact distally Intact pulses distally Dorsiflexion/Plantar flexion intact No cellulitis present Compartment soft  Assessment/Plan:  1 Day Post-Op Procedure(s) (LRB): RIGHT ANKLE OPEN REDUCTION INTERNAL FIXATION (ORIF) (Right) Advance diet Up with therapy Discharge to SNF if recommended by PT. I think even though she wants to go home SNF would be the safest option for her and her husband. Her daughter is at bedside and agrees.  We will try to do tylenol and Tramadol for pain control as anything stronger makes her hallucinate. She will resume her home eliquis today.   Ginger Organ Val Farnam 09/23/2020, 8:11 AM

## 2020-09-23 NOTE — Progress Notes (Signed)
Pt's pain is not relieving with tramadol, PA is paged and received verbal order for robaxin 500mg  q6 PRN. Orders carried out.

## 2020-09-23 NOTE — Plan of Care (Signed)
  Problem: Clinical Measurements: Goal: Postoperative complications will be avoided or minimized Outcome: Progressing   

## 2020-09-24 DIAGNOSIS — S82841A Displaced bimalleolar fracture of right lower leg, initial encounter for closed fracture: Secondary | ICD-10-CM | POA: Diagnosis not present

## 2020-09-24 DIAGNOSIS — I4891 Unspecified atrial fibrillation: Secondary | ICD-10-CM | POA: Diagnosis not present

## 2020-09-24 DIAGNOSIS — R251 Tremor, unspecified: Secondary | ICD-10-CM | POA: Diagnosis not present

## 2020-09-24 DIAGNOSIS — Z7401 Bed confinement status: Secondary | ICD-10-CM | POA: Diagnosis not present

## 2020-09-24 DIAGNOSIS — I272 Pulmonary hypertension, unspecified: Secondary | ICD-10-CM | POA: Diagnosis not present

## 2020-09-24 DIAGNOSIS — M25511 Pain in right shoulder: Secondary | ICD-10-CM | POA: Diagnosis not present

## 2020-09-24 DIAGNOSIS — R262 Difficulty in walking, not elsewhere classified: Secondary | ICD-10-CM | POA: Diagnosis not present

## 2020-09-24 DIAGNOSIS — Z743 Need for continuous supervision: Secondary | ICD-10-CM | POA: Diagnosis not present

## 2020-09-24 DIAGNOSIS — E559 Vitamin D deficiency, unspecified: Secondary | ICD-10-CM | POA: Diagnosis not present

## 2020-09-24 DIAGNOSIS — R2681 Unsteadiness on feet: Secondary | ICD-10-CM | POA: Diagnosis not present

## 2020-09-24 DIAGNOSIS — M6281 Muscle weakness (generalized): Secondary | ICD-10-CM | POA: Diagnosis not present

## 2020-09-24 DIAGNOSIS — Z96653 Presence of artificial knee joint, bilateral: Secondary | ICD-10-CM | POA: Diagnosis not present

## 2020-09-24 DIAGNOSIS — R5381 Other malaise: Secondary | ICD-10-CM | POA: Diagnosis not present

## 2020-09-24 DIAGNOSIS — I5042 Chronic combined systolic (congestive) and diastolic (congestive) heart failure: Secondary | ICD-10-CM | POA: Diagnosis not present

## 2020-09-24 DIAGNOSIS — M810 Age-related osteoporosis without current pathological fracture: Secondary | ICD-10-CM | POA: Diagnosis not present

## 2020-09-24 DIAGNOSIS — Z91040 Latex allergy status: Secondary | ICD-10-CM | POA: Diagnosis not present

## 2020-09-24 DIAGNOSIS — Z9181 History of falling: Secondary | ICD-10-CM | POA: Diagnosis not present

## 2020-09-24 DIAGNOSIS — M1711 Unilateral primary osteoarthritis, right knee: Secondary | ICD-10-CM | POA: Diagnosis not present

## 2020-09-24 DIAGNOSIS — D649 Anemia, unspecified: Secondary | ICD-10-CM | POA: Diagnosis not present

## 2020-09-24 DIAGNOSIS — R531 Weakness: Secondary | ICD-10-CM | POA: Diagnosis not present

## 2020-09-24 DIAGNOSIS — I5041 Acute combined systolic (congestive) and diastolic (congestive) heart failure: Secondary | ICD-10-CM | POA: Diagnosis not present

## 2020-09-24 DIAGNOSIS — Z20822 Contact with and (suspected) exposure to covid-19: Secondary | ICD-10-CM | POA: Diagnosis not present

## 2020-09-24 DIAGNOSIS — M1990 Unspecified osteoarthritis, unspecified site: Secondary | ICD-10-CM | POA: Diagnosis not present

## 2020-09-24 DIAGNOSIS — Z7901 Long term (current) use of anticoagulants: Secondary | ICD-10-CM | POA: Diagnosis not present

## 2020-09-24 DIAGNOSIS — S82841D Displaced bimalleolar fracture of right lower leg, subsequent encounter for closed fracture with routine healing: Secondary | ICD-10-CM | POA: Diagnosis not present

## 2020-09-24 MED ORDER — TRAMADOL HCL 50 MG PO TABS: 50.0000 mg | ORAL_TABLET | Freq: Four times a day (QID) | ORAL | 0 refills | Status: DC | PRN

## 2020-09-24 MED ORDER — METHOCARBAMOL 500 MG PO TABS: 500.0000 mg | ORAL_TABLET | Freq: Four times a day (QID) | ORAL | 0 refills | Status: DC | PRN

## 2020-09-24 NOTE — Progress Notes (Signed)
Subjective: 2 Days Post-Op Procedure(s) (LRB): RIGHT ANKLE OPEN REDUCTION INTERNAL FIXATION (ORIF) (Right)  Patient feels better this morning. The robaxin seemed to help quite a bit. She is agreeable to go to SNF and they are hoping for a bed to be available today.  Activity level:  nonweightbearing right leg Diet tolerance:  ok Voiding:  ok Patient reports pain as mild.    Objective: Vital signs in last 24 hours: Temp:  [97.6 F (36.4 C)-98.2 F (36.8 C)] 97.7 F (36.5 C) (06/16 0532) Pulse Rate:  [86-98] 87 (06/16 0532) Resp:  [16] 16 (06/16 0532) BP: (98-126)/(47-85) 126/85 (06/16 0532) SpO2:  [89 %-100 %] 97 % (06/16 0532)  Labs: Recent Labs    09/22/20 1110  HGB 12.1   Recent Labs    09/22/20 1110  WBC 7.3  RBC 3.93  HCT 37.6  PLT 169   Recent Labs    09/22/20 1110  NA 136  K 4.2  CL 101  CO2 26  BUN 16  CREATININE 0.80  GLUCOSE 95  CALCIUM 9.3   No results for input(s): LABPT, INR in the last 72 hours.  Physical Exam:  Neurologically intact ABD soft Neurovascular intact Sensation intact distally Intact pulses distally Dorsiflexion/Plantar flexion intact Incision: dressing C/D/I and no drainage No cellulitis present Compartment soft  Assessment/Plan:  2 Days Post-Op Procedure(s) (LRB): RIGHT ANKLE OPEN REDUCTION INTERNAL FIXATION (ORIF) (Right) Advance diet Up with therapy Discharge to SNF hopefully today. Nonweightbearing right leg. Follow up in office 2 weeks post op. Continue home eliquis Continue tramadol and robaxin for pain control  [name] Hugh Garrow 09/24/2020, 7:05 AM

## 2020-09-24 NOTE — Discharge Summary (Signed)
Patient ID: [name] MRN: [id number] DOB/AGE: 1930/06/19 87 y.o.  Admit date: 09/22/2020 Discharge date: 09/24/2020  Admission Diagnoses:  Principal Problem:   Ankle fracture, right Active Problems:   Closed right ankle fracture   Discharge Diagnoses:  Same  Past Medical History:  Diagnosis Date   Acute combined systolic and diastolic heart failure (HCC) 05/02/2017   Anemia    Anxiety    Arthritis    "knees" (06/18/2015)   Atrial fibrillation with RVR (HCC) 06/18/2015   h/o sig bleed on coumadin   Cardiogenic shock 05/02/2017   Complication of anesthesia 2010   "w/cardiac cath; got into a psychotic state for ~ 3 days; got me out w/Valium"   Depression    "I have it off and on; not as often as I've gotten older" (06/18/2015)   Diverticulosis    descending and sigmoid colon--Dr. Jarold Motto   DJD (degenerative joint disease) of knee    left   Dyspnea    with exertion and in morning mostly when wakes up    E coli infection    History of   GERD (gastroesophageal reflux disease)    History of blood transfusion    History of Clostridium difficile colitis    IBS (irritable bowel syndrome)    Insomnia     off chronic ambien 5mg  as of 10/11, rare/episodic use since then  DCM/CHF   LBBB (left bundle branch block)    Lymphocytic colitis 09/27/2016   Moderate to severe pulmonary hypertension (HCC) 11/26/2019   Nonischemic cardiomyopathy (HCC)    normal coronaries, EF 15-20% 04/2008, EF 50-55% 2015   Osteoporosis    on DXA 05/2010   Positive PPD    Retroperitoneal bleed    Sciatica    Right   Urinary incontinence    Occassionally    Surgeries: Procedure(s): RIGHT ANKLE OPEN REDUCTION INTERNAL FIXATION (ORIF) on 09/22/2020   Consultants:   Discharged Condition: Improved  Hospital Course: ANGELETTE GANUS is an 87 y.o. female who was admitted 09/22/2020 for operative treatment ofAnkle fracture, right. Patient has severe unremitting pain that affects sleep, daily  activities, and work/hobbies. After pre-op clearance the patient was taken to the operating room on 09/22/2020 and underwent  Procedure(s): RIGHT ANKLE OPEN REDUCTION INTERNAL FIXATION (ORIF).    Patient was given perioperative antibiotics:  Anti-infectives (From admission, onward)    Start     Dose/Rate Route Frequency Ordered Stop   09/22/20 1015  ceFAZolin (ANCEF) IVPB 2g/100 mL premix        2 g 200 mL/hr over 30 Minutes Intravenous On call to O.R. 09/22/20 1014 09/22/20 1315        Patient was given sequential compression devices, early ambulation, and chemoprophylaxis to prevent DVT.  Patient benefited maximally from hospital stay and there were no complications.    Recent vital signs: Patient Vitals for the past 24 hrs:  BP Temp Temp src Pulse Resp SpO2  09/24/20 0532 126/85 97.7 F (36.5 C) Oral 87 16 97 %  09/23/20 2110 111/84 98 F (36.7 C) Oral 90 16 93 %  09/23/20 1744 -- -- -- 92 -- 95 %  09/23/20 1421 -- 98.2 F (36.8 C) -- 86 -- 100 %  09/23/20 1356 114/68 -- -- 98 -- (!) 89 %  09/23/20 1020 (!) 98/47 97.6 F (36.4 C) Oral 89 16 97 %     Recent laboratory studies:  Recent Labs    09/22/20 1110  WBC 7.3  HGB 12.1  HCT  37.6  PLT 169  NA 136  K 4.2  CL 101  CO2 26  BUN 16  CREATININE 0.80  GLUCOSE 95  CALCIUM 9.3     Discharge Medications:   Allergies as of 09/24/2020       Reactions   Ace Inhibitors Cough   Warfarin Sodium Other (See Comments)   DOSE RELATED PHARMACOLOGIC EFFECT "bleed out"   Famotidine Other (See Comments)   GI upset   Codeine Other (See Comments)   sedation   Delsym [dextromethorphan Polistirex Er] Other (See Comments)   dizziness   Lactose Intolerance (gi) Diarrhea   Lasix [furosemide] Diarrhea   Oral Lasix gives her diarrhea, tolerates IV Rx   Phenylephrine Palpitations, Other (See Comments)   Nasal spray- "likely increase in nasal congestion".    Sulfamethoxazole-trimethoprim Nausea And Vomiting   GI intolerance.         Medication List     TAKE these medications    acetaminophen 500 MG tablet Commonly known as: TYLENOL Take 1,000 mg by mouth in the morning and at bedtime.   calcium carbonate 500 MG chewable tablet Commonly known as: TUMS - dosed in mg elemental calcium Chew 500 mg by mouth 2 (two) times daily as needed for indigestion or heartburn.   cholecalciferol 25 MCG (1000 UNIT) tablet Commonly known as: VITAMIN D3 Take 1,000 Units by mouth in the morning.   dapagliflozin propanediol 10 MG Tabs tablet Commonly known as: FARXIGA Take 5 mg by mouth in the morning.   loperamide 2 MG capsule Commonly known as: IMODIUM Take 2 mg by mouth 3 (three) times daily as needed for diarrhea or loose stools.   losartan 25 MG tablet Commonly known as: COZAAR Take 1 tablet (25 mg total) by mouth daily.   methocarbamol 500 MG tablet Commonly known as: ROBAXIN Take 1 tablet (500 mg total) by mouth every 6 (six) hours as needed for muscle spasms.   metoprolol succinate 100 MG 24 hr tablet Commonly known as: TOPROL-XL Take 1 tablet (100 mg total) by mouth daily. Take with or immediately following a meal.   predniSONE 5 MG tablet Commonly known as: DELTASONE Take 1 tablet (5 mg total) by mouth daily with breakfast.   SALONPAS PAIN RELIEF PATCH EX Apply 2 patches topically daily as needed (pain).   SYSTANE OP Place 1-2 drops into the left eye 3 (three) times daily as needed (dryness).   traMADol 50 MG tablet Commonly known as: ULTRAM Take 1 tablet (50 mg total) by mouth every 6 (six) hours as needed for moderate pain or severe pain (post op pain).       ASK your doctor about these medications    digoxin 0.125 MG tablet Commonly known as: LANOXIN TAKE 1/2 TABLET (0.0625 MG) BY MOUTH DAILY.   Eliquis 2.5 MG Tabs tablet Generic drug: apixaban TAKE 1 TABLET BY MOUTH TWICE A DAY   potassium chloride SA 20 MEQ tablet Commonly known as: KLOR-CON TAKE 2 TABLETS (40 MEQ TOTAL) BY  MOUTH DAILY.   sertraline 50 MG tablet Commonly known as: ZOLOFT Take 2 tablets (100 mg total) by mouth daily.   torsemide 20 MG tablet Commonly known as: DEMADEX Take 1 tablet (20 mg total) by mouth daily as needed. if 3 pound weight gain overnight or 5 pounds in a week.        Diagnostic Studies: DG Shoulder Right  Result Date: 09/23/2020 CLINICAL DATA:  Pain following fall EXAM: RIGHT SHOULDER - 2+ VIEW COMPARISON:  None. FINDINGS: Frontal, Y scapular, and axillary images were obtained. No acute fracture or dislocation. Sclerosis along the lateral humeral head may represent residua of previous Hill-Sachs defect. There is moderate generalized joint space narrowing. No erosive changes. Visualized right lung clear. IMPRESSION: No acute fracture or dislocation. Question Hill-Sachs defect along the lateral humeral head. Moderate generalized osteoarthritic change. No erosions. Electronically Signed   By: Bretta Bang III M.D.   On: 09/23/2020 14:42   DG Ankle 2 Views Right  Result Date: 09/22/2020 CLINICAL DATA:  Intraoperative imaging for fixation of right ankle fractures. EXAM: RIGHT ANKLE - 2 VIEW; DG C-ARM 1-60 MIN-NO REPORT COMPARISON:  None. FINDINGS: 2 fluoroscopic spot views demonstrate plate and screws in place across a distal fibular fracture and a single screw in place for fixation of a medial malleolar fracture. Position and alignment appear anatomic. Hardware is intact. No acute finding. IMPRESSION: Intraoperative imaging for fixation of ankle fractures. No acute finding. Electronically Signed   By: Drusilla Kanner M.D.   On: 09/22/2020 14:42   DG C-Arm 1-60 Min-No Report  Result Date: 09/22/2020 Fluoroscopy was utilized by the requesting physician.  No radiographic interpretation.    Disposition: Discharge disposition: 03-Skilled Nursing Facility       Discharge Instructions     Call MD / Call 911   Complete by: As directed    If you experience chest pain or  shortness of breath, CALL 911 and be transported to the hospital emergency room.  If you develope a fever above 101 F, pus (white drainage) or increased drainage or redness at the wound, or calf pain, call your surgeon's office.   Constipation Prevention   Complete by: As directed    Drink plenty of fluids.  Prune juice may be helpful.  You may use a stool softener, such as Colace (over the counter) 100 mg twice a day.  Use MiraLax (over the counter) for constipation as needed.   Diet - low sodium heart healthy   Complete by: As directed    Discharge instructions   Complete by: As directed    Ice, elevate, nonweightbearing right leg, Keep splint clean and dry until follow up. Follow up in office 2 weeks post op.   Increase activity slowly as tolerated   Complete by: As directed    Post-operative opioid taper instructions:   Complete by: As directed    POST-OPERATIVE OPIOID TAPER INSTRUCTIONS: It is important to wean off of your opioid medication as soon as possible. If you do not need pain medication after your surgery it is ok to stop day one. Opioids include: Codeine, Hydrocodone(Norco, Vicodin), Oxycodone(Percocet, oxycontin) and hydromorphone amongst others.  Long term and even short term use of opiods can cause: Increased pain response Dependence Constipation Depression Respiratory depression And more.  Withdrawal symptoms can include Flu like symptoms Nausea, vomiting And more Techniques to manage these symptoms Hydrate well Eat regular healthy meals Stay active Use relaxation techniques(deep breathing, meditating, yoga) Do Not substitute Alcohol to help with tapering If you have been on opioids for less than two weeks and do not have pain than it is ok to stop all together.  Plan to wean off of opioids This plan should start within one week post op of your joint replacement. Maintain the same interval or time between taking each dose and first decrease the dose.  Cut the  total daily intake of opioids by one tablet each day Next start to increase the time between doses. The  last dose that should be eliminated is the evening dose.           Follow-up Information     [name], MD. Call in 1 week(s).   Specialty: Orthopedic Surgery Contact information: [address] [place] [address] [phone]                  Signed: Ginger Organ Sidonie Dexheimer 09/24/2020, 7:11 AM

## 2020-09-24 NOTE — TOC Transition Note (Signed)
Transition of Care [facility]) - CM/SW Discharge Note   Patient Details  Name: [name] MRN: [id number] Date of Birth: Dec 16, 1930  [facility] CM/SW Contact:  Amada Jupiter, LCSW Phone Number: 09/24/2020, 12:53 PM   Clinical Narrative:     Pt medically cleared for dc today.  Have received insurance auth for SNF and pt has accepted bed at [facility].  PTAR called at 1250 with expected wait of 1 1/2 hours.  RN to call report to [phone].  No further TOC needs.  Final next level of care: Skilled Nursing Facility Barriers to Discharge: Barriers Resolved   Patient Goals and CMS Choice Patient states their goals for this hospitalization and ongoing recovery are:: return home following rehab CMS Medicare.gov Compare Post Acute Care list provided to:: Patient Choice offered to / list presented to : Patient  Discharge Placement   Existing PASRR number confirmed : 09/23/20          Patient chooses bed at: [facility] Patient to be transferred to facility by: PTAR Name of family member notified: spouse Patient and family notified of of transfer: 09/24/20  Discharge Plan and Services In-house Referral: Clinical Social Work   Post Acute Care Choice: Skilled Nursing Facility          DME Arranged: N/A DME Agency: NA                  Social Determinants of Health (SDOH) Interventions     Readmission Risk Interventions Readmission Risk Prevention Plan 08/06/2019  Transportation Screening Complete  PCP or Specialist Appt within 5-7 Days Complete  Home Care Screening Complete  Medication Review (RN CM) Complete  Some recent data might be hidden

## 2020-10-01 DIAGNOSIS — I5042 Chronic combined systolic (congestive) and diastolic (congestive) heart failure: Secondary | ICD-10-CM | POA: Diagnosis not present

## 2020-10-01 DIAGNOSIS — I4891 Unspecified atrial fibrillation: Secondary | ICD-10-CM | POA: Diagnosis not present

## 2020-10-01 DIAGNOSIS — R262 Difficulty in walking, not elsewhere classified: Secondary | ICD-10-CM | POA: Diagnosis not present

## 2020-10-01 DIAGNOSIS — S82841D Displaced bimalleolar fracture of right lower leg, subsequent encounter for closed fracture with routine healing: Secondary | ICD-10-CM | POA: Diagnosis not present

## 2020-10-07 DIAGNOSIS — M25511 Pain in right shoulder: Secondary | ICD-10-CM | POA: Diagnosis not present

## 2020-10-07 DIAGNOSIS — S82841A Displaced bimalleolar fracture of right lower leg, initial encounter for closed fracture: Secondary | ICD-10-CM | POA: Diagnosis not present

## 2020-10-08 DIAGNOSIS — S82841D Displaced bimalleolar fracture of right lower leg, subsequent encounter for closed fracture with routine healing: Secondary | ICD-10-CM | POA: Diagnosis not present

## 2020-10-08 DIAGNOSIS — R262 Difficulty in walking, not elsewhere classified: Secondary | ICD-10-CM | POA: Diagnosis not present

## 2020-10-08 DIAGNOSIS — I4891 Unspecified atrial fibrillation: Secondary | ICD-10-CM | POA: Diagnosis not present

## 2020-10-08 DIAGNOSIS — I5042 Chronic combined systolic (congestive) and diastolic (congestive) heart failure: Secondary | ICD-10-CM | POA: Diagnosis not present

## 2020-10-12 ENCOUNTER — Encounter (HOSPITAL_BASED_OUTPATIENT_CLINIC_OR_DEPARTMENT_OTHER): Payer: Self-pay

## 2020-10-14 DIAGNOSIS — M5431 Sciatica, right side: Secondary | ICD-10-CM | POA: Diagnosis not present

## 2020-10-14 DIAGNOSIS — Z96653 Presence of artificial knee joint, bilateral: Secondary | ICD-10-CM | POA: Diagnosis not present

## 2020-10-14 DIAGNOSIS — K5790 Diverticulosis of intestine, part unspecified, without perforation or abscess without bleeding: Secondary | ICD-10-CM | POA: Diagnosis not present

## 2020-10-14 DIAGNOSIS — K589 Irritable bowel syndrome without diarrhea: Secondary | ICD-10-CM | POA: Diagnosis not present

## 2020-10-14 DIAGNOSIS — Z7901 Long term (current) use of anticoagulants: Secondary | ICD-10-CM | POA: Diagnosis not present

## 2020-10-14 DIAGNOSIS — I428 Other cardiomyopathies: Secondary | ICD-10-CM | POA: Diagnosis not present

## 2020-10-14 DIAGNOSIS — G4700 Insomnia, unspecified: Secondary | ICD-10-CM | POA: Diagnosis not present

## 2020-10-14 DIAGNOSIS — I447 Left bundle-branch block, unspecified: Secondary | ICD-10-CM | POA: Diagnosis not present

## 2020-10-14 DIAGNOSIS — I110 Hypertensive heart disease with heart failure: Secondary | ICD-10-CM | POA: Diagnosis not present

## 2020-10-14 DIAGNOSIS — F32A Depression, unspecified: Secondary | ICD-10-CM | POA: Diagnosis not present

## 2020-10-14 DIAGNOSIS — K219 Gastro-esophageal reflux disease without esophagitis: Secondary | ICD-10-CM | POA: Diagnosis not present

## 2020-10-14 DIAGNOSIS — M800AXD Age-related osteoporosis with current pathological fracture, other site, subsequent encounter for fracture with routine healing: Secondary | ICD-10-CM | POA: Diagnosis not present

## 2020-10-14 DIAGNOSIS — I5042 Chronic combined systolic (congestive) and diastolic (congestive) heart failure: Secondary | ICD-10-CM | POA: Diagnosis not present

## 2020-10-14 DIAGNOSIS — I2510 Atherosclerotic heart disease of native coronary artery without angina pectoris: Secondary | ICD-10-CM | POA: Diagnosis not present

## 2020-10-14 DIAGNOSIS — Z9181 History of falling: Secondary | ICD-10-CM | POA: Diagnosis not present

## 2020-10-14 DIAGNOSIS — I4891 Unspecified atrial fibrillation: Secondary | ICD-10-CM | POA: Diagnosis not present

## 2020-10-14 DIAGNOSIS — I272 Pulmonary hypertension, unspecified: Secondary | ICD-10-CM | POA: Diagnosis not present

## 2020-10-15 ENCOUNTER — Other Ambulatory Visit: Payer: Self-pay

## 2020-10-15 ENCOUNTER — Encounter: Payer: Self-pay | Admitting: Family Medicine

## 2020-10-15 ENCOUNTER — Ambulatory Visit (INDEPENDENT_AMBULATORY_CARE_PROVIDER_SITE_OTHER): Payer: Medicare Other | Admitting: Family Medicine

## 2020-10-15 VITALS — BP 110/64 | HR 77 | Temp 97.8°F | Ht 65.0 in

## 2020-10-15 DIAGNOSIS — I4811 Longstanding persistent atrial fibrillation: Secondary | ICD-10-CM | POA: Diagnosis not present

## 2020-10-15 DIAGNOSIS — R5383 Other fatigue: Secondary | ICD-10-CM

## 2020-10-15 LAB — CBC WITH DIFFERENTIAL/PLATELET
Basophils Absolute: 0.1 10*3/uL (ref 0.0–0.1)
Basophils Relative: 0.6 % (ref 0.0–3.0)
Eosinophils Absolute: 0.1 10*3/uL (ref 0.0–0.7)
Eosinophils Relative: 1.2 % (ref 0.0–5.0)
HCT: 39.8 % (ref 36.0–46.0)
Hemoglobin: 13 g/dL (ref 12.0–15.0)
Lymphocytes Relative: 14.8 % (ref 12.0–46.0)
Lymphs Abs: 1.3 10*3/uL (ref 0.7–4.0)
MCHC: 32.6 g/dL (ref 30.0–36.0)
MCV: 95.4 fl (ref 78.0–100.0)
Monocytes Absolute: 0.7 10*3/uL (ref 0.1–1.0)
Monocytes Relative: 8.5 % (ref 3.0–12.0)
Neutro Abs: 6.4 10*3/uL (ref 1.4–7.7)
Neutrophils Relative %: 74.9 % (ref 43.0–77.0)
Platelets: 274 10*3/uL (ref 150.0–400.0)
RBC: 4.18 Mil/uL (ref 3.87–5.11)
RDW: 15.8 % — ABNORMAL HIGH (ref 11.5–15.5)
WBC: 8.5 10*3/uL (ref 4.0–10.5)

## 2020-10-15 LAB — BASIC METABOLIC PANEL
BUN: 14 mg/dL (ref 6–23)
CO2: 29 mEq/L (ref 19–32)
Calcium: 9.1 mg/dL (ref 8.4–10.5)
Chloride: 99 mEq/L (ref 96–112)
Creatinine, Ser: 0.83 mg/dL (ref 0.40–1.20)
GFR: 63.27 mL/min (ref >60.00)
Glucose, Bld: 97 mg/dL (ref 70–99)
Potassium: 4.6 mEq/L (ref 3.5–5.1)
Sodium: 135 mEq/L (ref 135–145)

## 2020-10-15 NOTE — Patient Instructions (Signed)
Go to the lab on the way out.   If you have mychart we'll likely use that to update you.    Let me see about options with torsemide after I see your labs.  Take care.  Glad to see you.

## 2020-10-15 NOTE — Progress Notes (Signed)
This visit occurred during the SARS-CoV-2 public health emergency.  Safety protocols were in place, including screening questions prior to the visit, additional usage of staff PPE, and extensive cleaning of exam room while observing appropriate contact time as indicated for disinfecting solutions.  Follow up. R ankle fx.  S/p repair.  Had SNF placement in the meantime.  She was at [employer].  She was able to get up to BR with wheelchair.  She is not in pain but fatigued.  She has very limited weight bearing.  She had trouble weighing to direct torsemide use.  She is doing HHPT.  She took torsemide Monday and Tuesday.  Her breathing improved with that.    She has ortho f/u pending.    Meds, vitals, and allergies reviewed.   ROS: Per HPI unless specifically indicated in ROS section   GEN: nad, alert and oriented HEENT: mucous membranes moist NECK: supple w/o LA CV: IRR.  Not tachy PULM: ctab, no inc wob ABD: soft, +bs EXT: no edema SKIN: well perfused.  R foot in cast  30 minutes were devoted to patient care in this encounter (this includes time spent reviewing the patient's file/history, interviewing and examining the patient, counseling/reviewing plan with patient).

## 2020-10-18 NOTE — Assessment & Plan Note (Signed)
She is not weightbearing so is difficult for the patient to weigh at home.  I would check with cardiology about diuretic options.  It may be worth trying 1/2 tablet of torsemide as needed but I want to check her labs first.  I will ask for cardiology input.  At this point still okay for outpatient follow-up.  She is going to follow-up with orthopedics and she is doing home health physical therapy regarding her fracture.  See notes on labs.

## 2020-10-22 DIAGNOSIS — K589 Irritable bowel syndrome without diarrhea: Secondary | ICD-10-CM | POA: Diagnosis not present

## 2020-10-22 DIAGNOSIS — I2510 Atherosclerotic heart disease of native coronary artery without angina pectoris: Secondary | ICD-10-CM | POA: Diagnosis not present

## 2020-10-22 DIAGNOSIS — I428 Other cardiomyopathies: Secondary | ICD-10-CM | POA: Diagnosis not present

## 2020-10-22 DIAGNOSIS — Z9181 History of falling: Secondary | ICD-10-CM | POA: Diagnosis not present

## 2020-10-22 DIAGNOSIS — I5042 Chronic combined systolic (congestive) and diastolic (congestive) heart failure: Secondary | ICD-10-CM | POA: Diagnosis not present

## 2020-10-22 DIAGNOSIS — M5431 Sciatica, right side: Secondary | ICD-10-CM | POA: Diagnosis not present

## 2020-10-22 DIAGNOSIS — I4891 Unspecified atrial fibrillation: Secondary | ICD-10-CM | POA: Diagnosis not present

## 2020-10-22 DIAGNOSIS — K5790 Diverticulosis of intestine, part unspecified, without perforation or abscess without bleeding: Secondary | ICD-10-CM | POA: Diagnosis not present

## 2020-10-22 DIAGNOSIS — G4700 Insomnia, unspecified: Secondary | ICD-10-CM | POA: Diagnosis not present

## 2020-10-22 DIAGNOSIS — F32A Depression, unspecified: Secondary | ICD-10-CM | POA: Diagnosis not present

## 2020-10-22 DIAGNOSIS — I447 Left bundle-branch block, unspecified: Secondary | ICD-10-CM | POA: Diagnosis not present

## 2020-10-22 DIAGNOSIS — Z7901 Long term (current) use of anticoagulants: Secondary | ICD-10-CM | POA: Diagnosis not present

## 2020-10-22 DIAGNOSIS — I272 Pulmonary hypertension, unspecified: Secondary | ICD-10-CM | POA: Diagnosis not present

## 2020-10-22 DIAGNOSIS — I110 Hypertensive heart disease with heart failure: Secondary | ICD-10-CM | POA: Diagnosis not present

## 2020-10-22 DIAGNOSIS — Z96653 Presence of artificial knee joint, bilateral: Secondary | ICD-10-CM | POA: Diagnosis not present

## 2020-10-22 DIAGNOSIS — K219 Gastro-esophageal reflux disease without esophagitis: Secondary | ICD-10-CM | POA: Diagnosis not present

## 2020-10-22 DIAGNOSIS — M800AXD Age-related osteoporosis with current pathological fracture, other site, subsequent encounter for fracture with routine healing: Secondary | ICD-10-CM | POA: Diagnosis not present

## 2020-10-24 ENCOUNTER — Encounter (HOSPITAL_BASED_OUTPATIENT_CLINIC_OR_DEPARTMENT_OTHER): Payer: Self-pay

## 2020-10-29 ENCOUNTER — Encounter (HOSPITAL_BASED_OUTPATIENT_CLINIC_OR_DEPARTMENT_OTHER): Payer: Self-pay

## 2020-10-29 ENCOUNTER — Other Ambulatory Visit: Payer: Self-pay | Admitting: Family Medicine

## 2020-10-29 MED ORDER — SERTRALINE HCL 50 MG PO TABS: 100.0000 mg | ORAL_TABLET | Freq: Every day | ORAL | 0 refills | Status: DC

## 2020-10-29 NOTE — Telephone Encounter (Signed)
2 boxes of Eliquis 2.5mg  provided to patient.

## 2020-10-30 ENCOUNTER — Encounter (HOSPITAL_BASED_OUTPATIENT_CLINIC_OR_DEPARTMENT_OTHER): Payer: Self-pay

## 2020-10-30 ENCOUNTER — Other Ambulatory Visit: Payer: Self-pay

## 2020-10-30 DIAGNOSIS — Z5181 Encounter for therapeutic drug level monitoring: Secondary | ICD-10-CM

## 2020-10-30 DIAGNOSIS — I4891 Unspecified atrial fibrillation: Secondary | ICD-10-CM

## 2020-10-30 DIAGNOSIS — Z79899 Other long term (current) drug therapy: Secondary | ICD-10-CM

## 2020-10-30 DIAGNOSIS — R5383 Other fatigue: Secondary | ICD-10-CM

## 2020-11-02 DIAGNOSIS — M5431 Sciatica, right side: Secondary | ICD-10-CM | POA: Diagnosis not present

## 2020-11-02 DIAGNOSIS — I428 Other cardiomyopathies: Secondary | ICD-10-CM | POA: Diagnosis not present

## 2020-11-02 DIAGNOSIS — M800AXD Age-related osteoporosis with current pathological fracture, other site, subsequent encounter for fracture with routine healing: Secondary | ICD-10-CM | POA: Diagnosis not present

## 2020-11-02 DIAGNOSIS — I110 Hypertensive heart disease with heart failure: Secondary | ICD-10-CM | POA: Diagnosis not present

## 2020-11-02 DIAGNOSIS — I272 Pulmonary hypertension, unspecified: Secondary | ICD-10-CM | POA: Diagnosis not present

## 2020-11-02 DIAGNOSIS — G4700 Insomnia, unspecified: Secondary | ICD-10-CM | POA: Diagnosis not present

## 2020-11-02 DIAGNOSIS — I2510 Atherosclerotic heart disease of native coronary artery without angina pectoris: Secondary | ICD-10-CM | POA: Diagnosis not present

## 2020-11-02 DIAGNOSIS — K219 Gastro-esophageal reflux disease without esophagitis: Secondary | ICD-10-CM | POA: Diagnosis not present

## 2020-11-02 DIAGNOSIS — I4891 Unspecified atrial fibrillation: Secondary | ICD-10-CM | POA: Diagnosis not present

## 2020-11-02 DIAGNOSIS — Z9181 History of falling: Secondary | ICD-10-CM | POA: Diagnosis not present

## 2020-11-02 DIAGNOSIS — I5042 Chronic combined systolic (congestive) and diastolic (congestive) heart failure: Secondary | ICD-10-CM | POA: Diagnosis not present

## 2020-11-02 DIAGNOSIS — I447 Left bundle-branch block, unspecified: Secondary | ICD-10-CM | POA: Diagnosis not present

## 2020-11-02 DIAGNOSIS — K589 Irritable bowel syndrome without diarrhea: Secondary | ICD-10-CM | POA: Diagnosis not present

## 2020-11-02 DIAGNOSIS — K5790 Diverticulosis of intestine, part unspecified, without perforation or abscess without bleeding: Secondary | ICD-10-CM | POA: Diagnosis not present

## 2020-11-02 DIAGNOSIS — Z96653 Presence of artificial knee joint, bilateral: Secondary | ICD-10-CM | POA: Diagnosis not present

## 2020-11-02 DIAGNOSIS — Z7901 Long term (current) use of anticoagulants: Secondary | ICD-10-CM | POA: Diagnosis not present

## 2020-11-02 DIAGNOSIS — F32A Depression, unspecified: Secondary | ICD-10-CM | POA: Diagnosis not present

## 2020-11-07 DIAGNOSIS — M6281 Muscle weakness (generalized): Secondary | ICD-10-CM | POA: Diagnosis not present

## 2020-11-07 DIAGNOSIS — Z9181 History of falling: Secondary | ICD-10-CM | POA: Diagnosis not present

## 2020-11-07 DIAGNOSIS — M1711 Unilateral primary osteoarthritis, right knee: Secondary | ICD-10-CM | POA: Diagnosis not present

## 2020-11-07 DIAGNOSIS — M810 Age-related osteoporosis without current pathological fracture: Secondary | ICD-10-CM | POA: Diagnosis not present

## 2020-11-07 DIAGNOSIS — S82841D Displaced bimalleolar fracture of right lower leg, subsequent encounter for closed fracture with routine healing: Secondary | ICD-10-CM | POA: Diagnosis not present

## 2020-11-09 DIAGNOSIS — K589 Irritable bowel syndrome without diarrhea: Secondary | ICD-10-CM | POA: Diagnosis not present

## 2020-11-09 DIAGNOSIS — I5042 Chronic combined systolic (congestive) and diastolic (congestive) heart failure: Secondary | ICD-10-CM | POA: Diagnosis not present

## 2020-11-09 DIAGNOSIS — G4700 Insomnia, unspecified: Secondary | ICD-10-CM | POA: Diagnosis not present

## 2020-11-09 DIAGNOSIS — Z96653 Presence of artificial knee joint, bilateral: Secondary | ICD-10-CM | POA: Diagnosis not present

## 2020-11-09 DIAGNOSIS — F32A Depression, unspecified: Secondary | ICD-10-CM | POA: Diagnosis not present

## 2020-11-09 DIAGNOSIS — K5790 Diverticulosis of intestine, part unspecified, without perforation or abscess without bleeding: Secondary | ICD-10-CM | POA: Diagnosis not present

## 2020-11-09 DIAGNOSIS — M800AXD Age-related osteoporosis with current pathological fracture, other site, subsequent encounter for fracture with routine healing: Secondary | ICD-10-CM | POA: Diagnosis not present

## 2020-11-09 DIAGNOSIS — K219 Gastro-esophageal reflux disease without esophagitis: Secondary | ICD-10-CM | POA: Diagnosis not present

## 2020-11-09 DIAGNOSIS — I4891 Unspecified atrial fibrillation: Secondary | ICD-10-CM | POA: Diagnosis not present

## 2020-11-09 DIAGNOSIS — Z9181 History of falling: Secondary | ICD-10-CM | POA: Diagnosis not present

## 2020-11-09 DIAGNOSIS — I447 Left bundle-branch block, unspecified: Secondary | ICD-10-CM | POA: Diagnosis not present

## 2020-11-09 DIAGNOSIS — M5431 Sciatica, right side: Secondary | ICD-10-CM | POA: Diagnosis not present

## 2020-11-09 DIAGNOSIS — I110 Hypertensive heart disease with heart failure: Secondary | ICD-10-CM | POA: Diagnosis not present

## 2020-11-09 DIAGNOSIS — I428 Other cardiomyopathies: Secondary | ICD-10-CM | POA: Diagnosis not present

## 2020-11-09 DIAGNOSIS — I2510 Atherosclerotic heart disease of native coronary artery without angina pectoris: Secondary | ICD-10-CM | POA: Diagnosis not present

## 2020-11-09 DIAGNOSIS — Z7901 Long term (current) use of anticoagulants: Secondary | ICD-10-CM | POA: Diagnosis not present

## 2020-11-09 DIAGNOSIS — I272 Pulmonary hypertension, unspecified: Secondary | ICD-10-CM | POA: Diagnosis not present

## 2020-11-10 ENCOUNTER — Encounter (HOSPITAL_BASED_OUTPATIENT_CLINIC_OR_DEPARTMENT_OTHER): Payer: Self-pay

## 2020-11-10 ENCOUNTER — Other Ambulatory Visit: Payer: Self-pay

## 2020-11-10 DIAGNOSIS — I4891 Unspecified atrial fibrillation: Secondary | ICD-10-CM | POA: Diagnosis not present

## 2020-11-10 DIAGNOSIS — Z5181 Encounter for therapeutic drug level monitoring: Secondary | ICD-10-CM | POA: Diagnosis not present

## 2020-11-10 DIAGNOSIS — Z79899 Other long term (current) drug therapy: Secondary | ICD-10-CM | POA: Diagnosis not present

## 2020-11-10 DIAGNOSIS — R5383 Other fatigue: Secondary | ICD-10-CM

## 2020-11-11 ENCOUNTER — Other Ambulatory Visit: Payer: Self-pay

## 2020-11-11 LAB — BASIC METABOLIC PANEL
BUN/Creatinine Ratio: 19 (ref 12–28)
BUN: 14 mg/dL (ref 8–27)
CO2: 25 mmol/L (ref 20–29)
Calcium: 9.5 mg/dL (ref 8.7–10.3)
Chloride: 96 mmol/L (ref 96–106)
Creatinine, Ser: 0.72 mg/dL (ref 0.57–1.00)
Glucose: 82 mg/dL (ref 65–99)
Potassium: 4.9 mmol/L (ref 3.5–5.2)
Sodium: 135 mmol/L (ref 134–144)
eGFR: 81 mL/min/{1.73_m2} (ref >59)

## 2020-11-11 MED ORDER — POTASSIUM CHLORIDE 20 MEQ PO PACK: PACK | ORAL | 6 refills | Status: DC

## 2020-11-11 NOTE — Telephone Encounter (Signed)
Spoke to patient our pharmacist prescribed Klor Con 20 meq packets.Advised to dissolve 1 packet in cold water or a beverage twice a day after meals.Prescription sent to pharmacy.

## 2020-11-12 ENCOUNTER — Encounter (HOSPITAL_BASED_OUTPATIENT_CLINIC_OR_DEPARTMENT_OTHER): Payer: Self-pay

## 2020-11-12 DIAGNOSIS — M800AXD Age-related osteoporosis with current pathological fracture, other site, subsequent encounter for fracture with routine healing: Secondary | ICD-10-CM | POA: Diagnosis not present

## 2020-11-12 DIAGNOSIS — I5042 Chronic combined systolic (congestive) and diastolic (congestive) heart failure: Secondary | ICD-10-CM | POA: Diagnosis not present

## 2020-11-12 DIAGNOSIS — M5431 Sciatica, right side: Secondary | ICD-10-CM | POA: Diagnosis not present

## 2020-11-12 DIAGNOSIS — Z96653 Presence of artificial knee joint, bilateral: Secondary | ICD-10-CM | POA: Diagnosis not present

## 2020-11-12 DIAGNOSIS — F32A Depression, unspecified: Secondary | ICD-10-CM | POA: Diagnosis not present

## 2020-11-12 DIAGNOSIS — I272 Pulmonary hypertension, unspecified: Secondary | ICD-10-CM | POA: Diagnosis not present

## 2020-11-12 DIAGNOSIS — I428 Other cardiomyopathies: Secondary | ICD-10-CM | POA: Diagnosis not present

## 2020-11-12 DIAGNOSIS — I4891 Unspecified atrial fibrillation: Secondary | ICD-10-CM | POA: Diagnosis not present

## 2020-11-12 DIAGNOSIS — Z7901 Long term (current) use of anticoagulants: Secondary | ICD-10-CM | POA: Diagnosis not present

## 2020-11-12 DIAGNOSIS — I110 Hypertensive heart disease with heart failure: Secondary | ICD-10-CM | POA: Diagnosis not present

## 2020-11-12 DIAGNOSIS — I447 Left bundle-branch block, unspecified: Secondary | ICD-10-CM | POA: Diagnosis not present

## 2020-11-12 DIAGNOSIS — G4700 Insomnia, unspecified: Secondary | ICD-10-CM | POA: Diagnosis not present

## 2020-11-12 DIAGNOSIS — K5790 Diverticulosis of intestine, part unspecified, without perforation or abscess without bleeding: Secondary | ICD-10-CM | POA: Diagnosis not present

## 2020-11-12 DIAGNOSIS — I2510 Atherosclerotic heart disease of native coronary artery without angina pectoris: Secondary | ICD-10-CM | POA: Diagnosis not present

## 2020-11-12 DIAGNOSIS — K589 Irritable bowel syndrome without diarrhea: Secondary | ICD-10-CM | POA: Diagnosis not present

## 2020-11-12 DIAGNOSIS — K219 Gastro-esophageal reflux disease without esophagitis: Secondary | ICD-10-CM | POA: Diagnosis not present

## 2020-11-12 DIAGNOSIS — Z9181 History of falling: Secondary | ICD-10-CM | POA: Diagnosis not present

## 2020-11-12 MED ORDER — POTASSIUM CHLORIDE CRYS ER 20 MEQ PO TBCR: 40.0000 meq | EXTENDED_RELEASE_TABLET | Freq: Every day | ORAL | 3 refills | Status: DC

## 2020-11-16 DIAGNOSIS — I110 Hypertensive heart disease with heart failure: Secondary | ICD-10-CM | POA: Diagnosis not present

## 2020-11-16 DIAGNOSIS — K219 Gastro-esophageal reflux disease without esophagitis: Secondary | ICD-10-CM | POA: Diagnosis not present

## 2020-11-16 DIAGNOSIS — Z7901 Long term (current) use of anticoagulants: Secondary | ICD-10-CM | POA: Diagnosis not present

## 2020-11-16 DIAGNOSIS — I4891 Unspecified atrial fibrillation: Secondary | ICD-10-CM | POA: Diagnosis not present

## 2020-11-16 DIAGNOSIS — I2510 Atherosclerotic heart disease of native coronary artery without angina pectoris: Secondary | ICD-10-CM | POA: Diagnosis not present

## 2020-11-16 DIAGNOSIS — I447 Left bundle-branch block, unspecified: Secondary | ICD-10-CM | POA: Diagnosis not present

## 2020-11-16 DIAGNOSIS — F32A Depression, unspecified: Secondary | ICD-10-CM | POA: Diagnosis not present

## 2020-11-16 DIAGNOSIS — I428 Other cardiomyopathies: Secondary | ICD-10-CM | POA: Diagnosis not present

## 2020-11-16 DIAGNOSIS — K589 Irritable bowel syndrome without diarrhea: Secondary | ICD-10-CM | POA: Diagnosis not present

## 2020-11-16 DIAGNOSIS — K5790 Diverticulosis of intestine, part unspecified, without perforation or abscess without bleeding: Secondary | ICD-10-CM | POA: Diagnosis not present

## 2020-11-16 DIAGNOSIS — I272 Pulmonary hypertension, unspecified: Secondary | ICD-10-CM | POA: Diagnosis not present

## 2020-11-16 DIAGNOSIS — G4700 Insomnia, unspecified: Secondary | ICD-10-CM | POA: Diagnosis not present

## 2020-11-16 DIAGNOSIS — Z9181 History of falling: Secondary | ICD-10-CM | POA: Diagnosis not present

## 2020-11-16 DIAGNOSIS — M800AXD Age-related osteoporosis with current pathological fracture, other site, subsequent encounter for fracture with routine healing: Secondary | ICD-10-CM | POA: Diagnosis not present

## 2020-11-16 DIAGNOSIS — M5431 Sciatica, right side: Secondary | ICD-10-CM | POA: Diagnosis not present

## 2020-11-16 DIAGNOSIS — I5042 Chronic combined systolic (congestive) and diastolic (congestive) heart failure: Secondary | ICD-10-CM | POA: Diagnosis not present

## 2020-11-16 DIAGNOSIS — Z96653 Presence of artificial knee joint, bilateral: Secondary | ICD-10-CM | POA: Diagnosis not present

## 2020-11-18 DIAGNOSIS — Z9889 Other specified postprocedural states: Secondary | ICD-10-CM | POA: Diagnosis not present

## 2020-11-18 DIAGNOSIS — S82841A Displaced bimalleolar fracture of right lower leg, initial encounter for closed fracture: Secondary | ICD-10-CM | POA: Diagnosis not present

## 2020-11-19 DIAGNOSIS — I272 Pulmonary hypertension, unspecified: Secondary | ICD-10-CM | POA: Diagnosis not present

## 2020-11-19 DIAGNOSIS — I2510 Atherosclerotic heart disease of native coronary artery without angina pectoris: Secondary | ICD-10-CM | POA: Diagnosis not present

## 2020-11-19 DIAGNOSIS — I4891 Unspecified atrial fibrillation: Secondary | ICD-10-CM | POA: Diagnosis not present

## 2020-11-19 DIAGNOSIS — K219 Gastro-esophageal reflux disease without esophagitis: Secondary | ICD-10-CM | POA: Diagnosis not present

## 2020-11-19 DIAGNOSIS — G4700 Insomnia, unspecified: Secondary | ICD-10-CM | POA: Diagnosis not present

## 2020-11-19 DIAGNOSIS — M5431 Sciatica, right side: Secondary | ICD-10-CM | POA: Diagnosis not present

## 2020-11-19 DIAGNOSIS — Z7901 Long term (current) use of anticoagulants: Secondary | ICD-10-CM | POA: Diagnosis not present

## 2020-11-19 DIAGNOSIS — Z96653 Presence of artificial knee joint, bilateral: Secondary | ICD-10-CM | POA: Diagnosis not present

## 2020-11-19 DIAGNOSIS — Z9181 History of falling: Secondary | ICD-10-CM | POA: Diagnosis not present

## 2020-11-19 DIAGNOSIS — F32A Depression, unspecified: Secondary | ICD-10-CM | POA: Diagnosis not present

## 2020-11-19 DIAGNOSIS — I447 Left bundle-branch block, unspecified: Secondary | ICD-10-CM | POA: Diagnosis not present

## 2020-11-19 DIAGNOSIS — M800AXD Age-related osteoporosis with current pathological fracture, other site, subsequent encounter for fracture with routine healing: Secondary | ICD-10-CM | POA: Diagnosis not present

## 2020-11-19 DIAGNOSIS — K589 Irritable bowel syndrome without diarrhea: Secondary | ICD-10-CM | POA: Diagnosis not present

## 2020-11-19 DIAGNOSIS — I5042 Chronic combined systolic (congestive) and diastolic (congestive) heart failure: Secondary | ICD-10-CM | POA: Diagnosis not present

## 2020-11-19 DIAGNOSIS — I428 Other cardiomyopathies: Secondary | ICD-10-CM | POA: Diagnosis not present

## 2020-11-19 DIAGNOSIS — I110 Hypertensive heart disease with heart failure: Secondary | ICD-10-CM | POA: Diagnosis not present

## 2020-11-19 DIAGNOSIS — K5790 Diverticulosis of intestine, part unspecified, without perforation or abscess without bleeding: Secondary | ICD-10-CM | POA: Diagnosis not present

## 2020-11-23 DIAGNOSIS — I5042 Chronic combined systolic (congestive) and diastolic (congestive) heart failure: Secondary | ICD-10-CM | POA: Diagnosis not present

## 2020-11-23 DIAGNOSIS — I2510 Atherosclerotic heart disease of native coronary artery without angina pectoris: Secondary | ICD-10-CM | POA: Diagnosis not present

## 2020-11-23 DIAGNOSIS — G4700 Insomnia, unspecified: Secondary | ICD-10-CM | POA: Diagnosis not present

## 2020-11-23 DIAGNOSIS — M800AXD Age-related osteoporosis with current pathological fracture, other site, subsequent encounter for fracture with routine healing: Secondary | ICD-10-CM | POA: Diagnosis not present

## 2020-11-23 DIAGNOSIS — I4891 Unspecified atrial fibrillation: Secondary | ICD-10-CM | POA: Diagnosis not present

## 2020-11-23 DIAGNOSIS — I272 Pulmonary hypertension, unspecified: Secondary | ICD-10-CM | POA: Diagnosis not present

## 2020-11-23 DIAGNOSIS — K219 Gastro-esophageal reflux disease without esophagitis: Secondary | ICD-10-CM | POA: Diagnosis not present

## 2020-11-23 DIAGNOSIS — I428 Other cardiomyopathies: Secondary | ICD-10-CM | POA: Diagnosis not present

## 2020-11-23 DIAGNOSIS — F32A Depression, unspecified: Secondary | ICD-10-CM | POA: Diagnosis not present

## 2020-11-23 DIAGNOSIS — K5790 Diverticulosis of intestine, part unspecified, without perforation or abscess without bleeding: Secondary | ICD-10-CM | POA: Diagnosis not present

## 2020-11-23 DIAGNOSIS — M5431 Sciatica, right side: Secondary | ICD-10-CM | POA: Diagnosis not present

## 2020-11-23 DIAGNOSIS — I447 Left bundle-branch block, unspecified: Secondary | ICD-10-CM | POA: Diagnosis not present

## 2020-11-23 DIAGNOSIS — Z9181 History of falling: Secondary | ICD-10-CM | POA: Diagnosis not present

## 2020-11-23 DIAGNOSIS — I110 Hypertensive heart disease with heart failure: Secondary | ICD-10-CM | POA: Diagnosis not present

## 2020-11-23 DIAGNOSIS — Z7901 Long term (current) use of anticoagulants: Secondary | ICD-10-CM | POA: Diagnosis not present

## 2020-11-23 DIAGNOSIS — K589 Irritable bowel syndrome without diarrhea: Secondary | ICD-10-CM | POA: Diagnosis not present

## 2020-11-23 DIAGNOSIS — Z96653 Presence of artificial knee joint, bilateral: Secondary | ICD-10-CM | POA: Diagnosis not present

## 2020-11-24 ENCOUNTER — Encounter (HOSPITAL_BASED_OUTPATIENT_CLINIC_OR_DEPARTMENT_OTHER): Payer: Self-pay

## 2020-11-25 ENCOUNTER — Encounter (HOSPITAL_BASED_OUTPATIENT_CLINIC_OR_DEPARTMENT_OTHER): Payer: Self-pay

## 2020-11-26 ENCOUNTER — Encounter (HOSPITAL_BASED_OUTPATIENT_CLINIC_OR_DEPARTMENT_OTHER): Payer: Self-pay

## 2020-11-26 ENCOUNTER — Telehealth: Payer: Self-pay | Admitting: Cardiovascular Disease

## 2020-11-26 DIAGNOSIS — Z9181 History of falling: Secondary | ICD-10-CM | POA: Diagnosis not present

## 2020-11-26 DIAGNOSIS — K589 Irritable bowel syndrome without diarrhea: Secondary | ICD-10-CM | POA: Diagnosis not present

## 2020-11-26 DIAGNOSIS — F32A Depression, unspecified: Secondary | ICD-10-CM | POA: Diagnosis not present

## 2020-11-26 DIAGNOSIS — I447 Left bundle-branch block, unspecified: Secondary | ICD-10-CM | POA: Diagnosis not present

## 2020-11-26 DIAGNOSIS — Z96653 Presence of artificial knee joint, bilateral: Secondary | ICD-10-CM | POA: Diagnosis not present

## 2020-11-26 DIAGNOSIS — M800AXD Age-related osteoporosis with current pathological fracture, other site, subsequent encounter for fracture with routine healing: Secondary | ICD-10-CM | POA: Diagnosis not present

## 2020-11-26 DIAGNOSIS — Z7901 Long term (current) use of anticoagulants: Secondary | ICD-10-CM | POA: Diagnosis not present

## 2020-11-26 DIAGNOSIS — I272 Pulmonary hypertension, unspecified: Secondary | ICD-10-CM | POA: Diagnosis not present

## 2020-11-26 DIAGNOSIS — I4891 Unspecified atrial fibrillation: Secondary | ICD-10-CM | POA: Diagnosis not present

## 2020-11-26 DIAGNOSIS — G4700 Insomnia, unspecified: Secondary | ICD-10-CM | POA: Diagnosis not present

## 2020-11-26 DIAGNOSIS — I2510 Atherosclerotic heart disease of native coronary artery without angina pectoris: Secondary | ICD-10-CM | POA: Diagnosis not present

## 2020-11-26 DIAGNOSIS — I110 Hypertensive heart disease with heart failure: Secondary | ICD-10-CM | POA: Diagnosis not present

## 2020-11-26 DIAGNOSIS — M5431 Sciatica, right side: Secondary | ICD-10-CM | POA: Diagnosis not present

## 2020-11-26 DIAGNOSIS — K219 Gastro-esophageal reflux disease without esophagitis: Secondary | ICD-10-CM | POA: Diagnosis not present

## 2020-11-26 DIAGNOSIS — K5790 Diverticulosis of intestine, part unspecified, without perforation or abscess without bleeding: Secondary | ICD-10-CM | POA: Diagnosis not present

## 2020-11-26 DIAGNOSIS — I428 Other cardiomyopathies: Secondary | ICD-10-CM | POA: Diagnosis not present

## 2020-11-26 DIAGNOSIS — I5042 Chronic combined systolic (congestive) and diastolic (congestive) heart failure: Secondary | ICD-10-CM | POA: Diagnosis not present

## 2020-11-26 NOTE — Telephone Encounter (Signed)
Pt is reaching out to check the status of her medical assistance forms.. per previous note by Nurse Marcelino Duster pt can check the status @ [phone], conference called and spoke w/ a rep who states that they have not received anything for this pt and doesn't see where anything has gone out for this pt either. The rep states that due to the pt almost being out of rx a new app or existing one can be expedited and sent to fax# [phone]. Mrs. [name]'s patient number with this company is [phone] and would take about 2-3 hours to be available to view in the system.     Patient calling the office for samples of medication:   1.  What medication and dosage are you requesting samples for? ELIQUIS 2.5 MG TABS tablet  2.  Are you currently out of this medication? yes

## 2020-11-26 NOTE — Telephone Encounter (Signed)
Spoke with patient and advised not at [facility] office  They will bring information to [facility] and Eliquis 2.5 mg #4 lot [id number] Exp 6/23 ready for pick up

## 2020-11-30 DIAGNOSIS — I4891 Unspecified atrial fibrillation: Secondary | ICD-10-CM | POA: Diagnosis not present

## 2020-11-30 DIAGNOSIS — I2510 Atherosclerotic heart disease of native coronary artery without angina pectoris: Secondary | ICD-10-CM | POA: Diagnosis not present

## 2020-11-30 DIAGNOSIS — I447 Left bundle-branch block, unspecified: Secondary | ICD-10-CM | POA: Diagnosis not present

## 2020-11-30 DIAGNOSIS — K5790 Diverticulosis of intestine, part unspecified, without perforation or abscess without bleeding: Secondary | ICD-10-CM | POA: Diagnosis not present

## 2020-11-30 DIAGNOSIS — I5042 Chronic combined systolic (congestive) and diastolic (congestive) heart failure: Secondary | ICD-10-CM | POA: Diagnosis not present

## 2020-11-30 DIAGNOSIS — I272 Pulmonary hypertension, unspecified: Secondary | ICD-10-CM | POA: Diagnosis not present

## 2020-11-30 DIAGNOSIS — M5431 Sciatica, right side: Secondary | ICD-10-CM | POA: Diagnosis not present

## 2020-11-30 DIAGNOSIS — I110 Hypertensive heart disease with heart failure: Secondary | ICD-10-CM | POA: Diagnosis not present

## 2020-11-30 DIAGNOSIS — K219 Gastro-esophageal reflux disease without esophagitis: Secondary | ICD-10-CM | POA: Diagnosis not present

## 2020-11-30 DIAGNOSIS — Z7901 Long term (current) use of anticoagulants: Secondary | ICD-10-CM | POA: Diagnosis not present

## 2020-11-30 DIAGNOSIS — I428 Other cardiomyopathies: Secondary | ICD-10-CM | POA: Diagnosis not present

## 2020-11-30 DIAGNOSIS — F32A Depression, unspecified: Secondary | ICD-10-CM | POA: Diagnosis not present

## 2020-11-30 DIAGNOSIS — G4700 Insomnia, unspecified: Secondary | ICD-10-CM | POA: Diagnosis not present

## 2020-11-30 DIAGNOSIS — Z9181 History of falling: Secondary | ICD-10-CM | POA: Diagnosis not present

## 2020-11-30 DIAGNOSIS — M800AXD Age-related osteoporosis with current pathological fracture, other site, subsequent encounter for fracture with routine healing: Secondary | ICD-10-CM | POA: Diagnosis not present

## 2020-11-30 DIAGNOSIS — Z96653 Presence of artificial knee joint, bilateral: Secondary | ICD-10-CM | POA: Diagnosis not present

## 2020-11-30 DIAGNOSIS — K589 Irritable bowel syndrome without diarrhea: Secondary | ICD-10-CM | POA: Diagnosis not present

## 2020-12-03 DIAGNOSIS — I447 Left bundle-branch block, unspecified: Secondary | ICD-10-CM | POA: Diagnosis not present

## 2020-12-03 DIAGNOSIS — K5790 Diverticulosis of intestine, part unspecified, without perforation or abscess without bleeding: Secondary | ICD-10-CM | POA: Diagnosis not present

## 2020-12-03 DIAGNOSIS — M800AXD Age-related osteoporosis with current pathological fracture, other site, subsequent encounter for fracture with routine healing: Secondary | ICD-10-CM | POA: Diagnosis not present

## 2020-12-03 DIAGNOSIS — Z9181 History of falling: Secondary | ICD-10-CM | POA: Diagnosis not present

## 2020-12-03 DIAGNOSIS — Z7901 Long term (current) use of anticoagulants: Secondary | ICD-10-CM | POA: Diagnosis not present

## 2020-12-03 DIAGNOSIS — K589 Irritable bowel syndrome without diarrhea: Secondary | ICD-10-CM | POA: Diagnosis not present

## 2020-12-03 DIAGNOSIS — I2510 Atherosclerotic heart disease of native coronary artery without angina pectoris: Secondary | ICD-10-CM | POA: Diagnosis not present

## 2020-12-03 DIAGNOSIS — I428 Other cardiomyopathies: Secondary | ICD-10-CM | POA: Diagnosis not present

## 2020-12-03 DIAGNOSIS — I4891 Unspecified atrial fibrillation: Secondary | ICD-10-CM | POA: Diagnosis not present

## 2020-12-03 DIAGNOSIS — I272 Pulmonary hypertension, unspecified: Secondary | ICD-10-CM | POA: Diagnosis not present

## 2020-12-03 DIAGNOSIS — K219 Gastro-esophageal reflux disease without esophagitis: Secondary | ICD-10-CM | POA: Diagnosis not present

## 2020-12-03 DIAGNOSIS — F32A Depression, unspecified: Secondary | ICD-10-CM | POA: Diagnosis not present

## 2020-12-03 DIAGNOSIS — I110 Hypertensive heart disease with heart failure: Secondary | ICD-10-CM | POA: Diagnosis not present

## 2020-12-03 DIAGNOSIS — Z96653 Presence of artificial knee joint, bilateral: Secondary | ICD-10-CM | POA: Diagnosis not present

## 2020-12-03 DIAGNOSIS — I5042 Chronic combined systolic (congestive) and diastolic (congestive) heart failure: Secondary | ICD-10-CM | POA: Diagnosis not present

## 2020-12-03 DIAGNOSIS — M5431 Sciatica, right side: Secondary | ICD-10-CM | POA: Diagnosis not present

## 2020-12-03 DIAGNOSIS — G4700 Insomnia, unspecified: Secondary | ICD-10-CM | POA: Diagnosis not present

## 2020-12-03 NOTE — Telephone Encounter (Signed)
Patient assistance forms faxed, confirmation received

## 2020-12-04 NOTE — Telephone Encounter (Signed)
See 8/18 phone note

## 2020-12-04 NOTE — Telephone Encounter (Signed)
See 8/18 phone note 

## 2020-12-07 DIAGNOSIS — I2510 Atherosclerotic heart disease of native coronary artery without angina pectoris: Secondary | ICD-10-CM | POA: Diagnosis not present

## 2020-12-07 DIAGNOSIS — K219 Gastro-esophageal reflux disease without esophagitis: Secondary | ICD-10-CM | POA: Diagnosis not present

## 2020-12-07 DIAGNOSIS — K589 Irritable bowel syndrome without diarrhea: Secondary | ICD-10-CM | POA: Diagnosis not present

## 2020-12-07 DIAGNOSIS — I5042 Chronic combined systolic (congestive) and diastolic (congestive) heart failure: Secondary | ICD-10-CM | POA: Diagnosis not present

## 2020-12-07 DIAGNOSIS — I110 Hypertensive heart disease with heart failure: Secondary | ICD-10-CM | POA: Diagnosis not present

## 2020-12-07 DIAGNOSIS — I447 Left bundle-branch block, unspecified: Secondary | ICD-10-CM | POA: Diagnosis not present

## 2020-12-07 DIAGNOSIS — M800AXD Age-related osteoporosis with current pathological fracture, other site, subsequent encounter for fracture with routine healing: Secondary | ICD-10-CM | POA: Diagnosis not present

## 2020-12-07 DIAGNOSIS — I272 Pulmonary hypertension, unspecified: Secondary | ICD-10-CM | POA: Diagnosis not present

## 2020-12-07 DIAGNOSIS — I428 Other cardiomyopathies: Secondary | ICD-10-CM | POA: Diagnosis not present

## 2020-12-07 DIAGNOSIS — I4891 Unspecified atrial fibrillation: Secondary | ICD-10-CM | POA: Diagnosis not present

## 2020-12-07 DIAGNOSIS — Z96653 Presence of artificial knee joint, bilateral: Secondary | ICD-10-CM | POA: Diagnosis not present

## 2020-12-07 DIAGNOSIS — G4700 Insomnia, unspecified: Secondary | ICD-10-CM | POA: Diagnosis not present

## 2020-12-07 DIAGNOSIS — Z9181 History of falling: Secondary | ICD-10-CM | POA: Diagnosis not present

## 2020-12-07 DIAGNOSIS — Z7901 Long term (current) use of anticoagulants: Secondary | ICD-10-CM | POA: Diagnosis not present

## 2020-12-07 DIAGNOSIS — F32A Depression, unspecified: Secondary | ICD-10-CM | POA: Diagnosis not present

## 2020-12-07 DIAGNOSIS — K5790 Diverticulosis of intestine, part unspecified, without perforation or abscess without bleeding: Secondary | ICD-10-CM | POA: Diagnosis not present

## 2020-12-07 DIAGNOSIS — M5431 Sciatica, right side: Secondary | ICD-10-CM | POA: Diagnosis not present

## 2020-12-08 DIAGNOSIS — S82841D Displaced bimalleolar fracture of right lower leg, subsequent encounter for closed fracture with routine healing: Secondary | ICD-10-CM | POA: Diagnosis not present

## 2020-12-08 DIAGNOSIS — Z9181 History of falling: Secondary | ICD-10-CM | POA: Diagnosis not present

## 2020-12-08 DIAGNOSIS — M810 Age-related osteoporosis without current pathological fracture: Secondary | ICD-10-CM | POA: Diagnosis not present

## 2020-12-08 DIAGNOSIS — M6281 Muscle weakness (generalized): Secondary | ICD-10-CM | POA: Diagnosis not present

## 2020-12-08 DIAGNOSIS — M1711 Unilateral primary osteoarthritis, right knee: Secondary | ICD-10-CM | POA: Diagnosis not present

## 2020-12-10 DIAGNOSIS — I4891 Unspecified atrial fibrillation: Secondary | ICD-10-CM | POA: Diagnosis not present

## 2020-12-10 DIAGNOSIS — G4700 Insomnia, unspecified: Secondary | ICD-10-CM | POA: Diagnosis not present

## 2020-12-10 DIAGNOSIS — K589 Irritable bowel syndrome without diarrhea: Secondary | ICD-10-CM | POA: Diagnosis not present

## 2020-12-10 DIAGNOSIS — M5431 Sciatica, right side: Secondary | ICD-10-CM | POA: Diagnosis not present

## 2020-12-10 DIAGNOSIS — I5042 Chronic combined systolic (congestive) and diastolic (congestive) heart failure: Secondary | ICD-10-CM | POA: Diagnosis not present

## 2020-12-10 DIAGNOSIS — R197 Diarrhea, unspecified: Secondary | ICD-10-CM

## 2020-12-10 DIAGNOSIS — I2510 Atherosclerotic heart disease of native coronary artery without angina pectoris: Secondary | ICD-10-CM | POA: Diagnosis not present

## 2020-12-10 DIAGNOSIS — Z96653 Presence of artificial knee joint, bilateral: Secondary | ICD-10-CM | POA: Diagnosis not present

## 2020-12-10 DIAGNOSIS — K219 Gastro-esophageal reflux disease without esophagitis: Secondary | ICD-10-CM | POA: Diagnosis not present

## 2020-12-10 DIAGNOSIS — K5790 Diverticulosis of intestine, part unspecified, without perforation or abscess without bleeding: Secondary | ICD-10-CM | POA: Diagnosis not present

## 2020-12-10 DIAGNOSIS — Z9181 History of falling: Secondary | ICD-10-CM | POA: Diagnosis not present

## 2020-12-10 DIAGNOSIS — I272 Pulmonary hypertension, unspecified: Secondary | ICD-10-CM | POA: Diagnosis not present

## 2020-12-10 DIAGNOSIS — I447 Left bundle-branch block, unspecified: Secondary | ICD-10-CM | POA: Diagnosis not present

## 2020-12-10 DIAGNOSIS — Z7901 Long term (current) use of anticoagulants: Secondary | ICD-10-CM | POA: Diagnosis not present

## 2020-12-10 DIAGNOSIS — F32A Depression, unspecified: Secondary | ICD-10-CM | POA: Diagnosis not present

## 2020-12-10 DIAGNOSIS — I428 Other cardiomyopathies: Secondary | ICD-10-CM | POA: Diagnosis not present

## 2020-12-10 DIAGNOSIS — M800AXD Age-related osteoporosis with current pathological fracture, other site, subsequent encounter for fracture with routine healing: Secondary | ICD-10-CM | POA: Diagnosis not present

## 2020-12-10 DIAGNOSIS — I110 Hypertensive heart disease with heart failure: Secondary | ICD-10-CM | POA: Diagnosis not present

## 2020-12-10 MED ORDER — CHOLESTYRAMINE 4 G PO PACK: 4.0000 g | PACK | Freq: Every day | ORAL | 2 refills | Status: DC

## 2020-12-10 NOTE — Telephone Encounter (Signed)
DOD  I have reviewed the chart IBS vs lymphocytic colitis vs both  In her message-looks like 10 mg pills is prednisone.   Plan: -Stool studies for GI Pathogen (includes C. Diff), and Calprotectin -Hold off on prednisone -Imodium AD 2 tabs 3 times daily to continue -For now, until stool studies are back and Dx is clear, put her on cholestyramine 4 g p.o. once a day-to be taken 2 hours before or after rest of the meds. #30, 2 refills -Can we move her GI appointment up-if she is not better  RG

## 2020-12-10 NOTE — Telephone Encounter (Signed)
[name], this is a patient of Dr. Marvell Fuller who I have not seen.  Dr. Leone Payor is on vacation this week.  Please send this as an urgent message to the doc of the day which is the appropriate protocol.  Thank you

## 2020-12-11 ENCOUNTER — Other Ambulatory Visit: Payer: Medicare Other

## 2020-12-12 ENCOUNTER — Encounter (HOSPITAL_BASED_OUTPATIENT_CLINIC_OR_DEPARTMENT_OTHER): Payer: Self-pay

## 2020-12-13 DIAGNOSIS — M800AXD Age-related osteoporosis with current pathological fracture, other site, subsequent encounter for fracture with routine healing: Secondary | ICD-10-CM | POA: Diagnosis not present

## 2020-12-15 ENCOUNTER — Other Ambulatory Visit: Payer: Medicare Other

## 2020-12-15 DIAGNOSIS — I447 Left bundle-branch block, unspecified: Secondary | ICD-10-CM | POA: Diagnosis not present

## 2020-12-15 DIAGNOSIS — I2510 Atherosclerotic heart disease of native coronary artery without angina pectoris: Secondary | ICD-10-CM | POA: Diagnosis not present

## 2020-12-15 DIAGNOSIS — R197 Diarrhea, unspecified: Secondary | ICD-10-CM | POA: Diagnosis not present

## 2020-12-15 DIAGNOSIS — I110 Hypertensive heart disease with heart failure: Secondary | ICD-10-CM | POA: Diagnosis not present

## 2020-12-15 DIAGNOSIS — I5042 Chronic combined systolic (congestive) and diastolic (congestive) heart failure: Secondary | ICD-10-CM | POA: Diagnosis not present

## 2020-12-15 DIAGNOSIS — K589 Irritable bowel syndrome without diarrhea: Secondary | ICD-10-CM | POA: Diagnosis not present

## 2020-12-15 DIAGNOSIS — Z9181 History of falling: Secondary | ICD-10-CM | POA: Diagnosis not present

## 2020-12-15 DIAGNOSIS — M800AXD Age-related osteoporosis with current pathological fracture, other site, subsequent encounter for fracture with routine healing: Secondary | ICD-10-CM | POA: Diagnosis not present

## 2020-12-15 DIAGNOSIS — M5431 Sciatica, right side: Secondary | ICD-10-CM | POA: Diagnosis not present

## 2020-12-15 DIAGNOSIS — I428 Other cardiomyopathies: Secondary | ICD-10-CM | POA: Diagnosis not present

## 2020-12-15 DIAGNOSIS — A048 Other specified bacterial intestinal infections: Secondary | ICD-10-CM | POA: Diagnosis not present

## 2020-12-15 DIAGNOSIS — Z96653 Presence of artificial knee joint, bilateral: Secondary | ICD-10-CM | POA: Diagnosis not present

## 2020-12-15 DIAGNOSIS — Z7901 Long term (current) use of anticoagulants: Secondary | ICD-10-CM | POA: Diagnosis not present

## 2020-12-15 DIAGNOSIS — G4700 Insomnia, unspecified: Secondary | ICD-10-CM | POA: Diagnosis not present

## 2020-12-15 DIAGNOSIS — I272 Pulmonary hypertension, unspecified: Secondary | ICD-10-CM | POA: Diagnosis not present

## 2020-12-15 DIAGNOSIS — K5790 Diverticulosis of intestine, part unspecified, without perforation or abscess without bleeding: Secondary | ICD-10-CM | POA: Diagnosis not present

## 2020-12-15 DIAGNOSIS — I4891 Unspecified atrial fibrillation: Secondary | ICD-10-CM | POA: Diagnosis not present

## 2020-12-15 DIAGNOSIS — K219 Gastro-esophageal reflux disease without esophagitis: Secondary | ICD-10-CM | POA: Diagnosis not present

## 2020-12-15 DIAGNOSIS — F32A Depression, unspecified: Secondary | ICD-10-CM | POA: Diagnosis not present

## 2020-12-15 MED ORDER — LOSARTAN POTASSIUM 25 MG PO TABS: 12.5000 mg | ORAL_TABLET | Freq: Every day | ORAL | 3 refills | Status: DC

## 2020-12-15 NOTE — Telephone Encounter (Signed)
Called Miss Smead to check in on BP.   Her blood pressure has been getting "low" for longer than 1 week. SBP routinely <110. Tells me her blood pressure this morning was 102/59 when checked by PT. She reports it makes her very tired. No lightheadedness nor dizziness. Tells me her diarrhea is some improved since starting Cholestryamine as directed by Dr. Leone Payor. She is taking a stool sample to his office this afternoon. Diarrhea has been ongoing for 6 weeks and we discussed that dehydration could be contributory to hypotension. Discussed to try to increase fluid intake but not exceed 2L.  Reviewed medications in detail. She has not required her Torsemide recently per her report. She is not taking Farxiga 5mg . Tells me noticed side effects of dry mouth, dry eyes. Tells me she quit taking it and is not interested in resuming. She was getting it through patient assistance and needs to stop the auto prescriptions. Taking her other cardiac medications as directed.  Given hypotension, we will reduce Losartan to half tablet (12.5mg ) once daily. She will monitor BP and bring a log to her upcoming appt 12/18/20.  02/17/21, NP

## 2020-12-17 ENCOUNTER — Other Ambulatory Visit: Payer: Self-pay

## 2020-12-17 DIAGNOSIS — I2510 Atherosclerotic heart disease of native coronary artery without angina pectoris: Secondary | ICD-10-CM | POA: Diagnosis not present

## 2020-12-17 DIAGNOSIS — I4891 Unspecified atrial fibrillation: Secondary | ICD-10-CM | POA: Diagnosis not present

## 2020-12-17 DIAGNOSIS — M5431 Sciatica, right side: Secondary | ICD-10-CM | POA: Diagnosis not present

## 2020-12-17 DIAGNOSIS — K589 Irritable bowel syndrome without diarrhea: Secondary | ICD-10-CM | POA: Diagnosis not present

## 2020-12-17 DIAGNOSIS — I110 Hypertensive heart disease with heart failure: Secondary | ICD-10-CM | POA: Diagnosis not present

## 2020-12-17 DIAGNOSIS — K219 Gastro-esophageal reflux disease without esophagitis: Secondary | ICD-10-CM | POA: Diagnosis not present

## 2020-12-17 DIAGNOSIS — G4700 Insomnia, unspecified: Secondary | ICD-10-CM | POA: Diagnosis not present

## 2020-12-17 DIAGNOSIS — I272 Pulmonary hypertension, unspecified: Secondary | ICD-10-CM | POA: Diagnosis not present

## 2020-12-17 DIAGNOSIS — Z7901 Long term (current) use of anticoagulants: Secondary | ICD-10-CM | POA: Diagnosis not present

## 2020-12-17 DIAGNOSIS — I447 Left bundle-branch block, unspecified: Secondary | ICD-10-CM | POA: Diagnosis not present

## 2020-12-17 DIAGNOSIS — M800AXD Age-related osteoporosis with current pathological fracture, other site, subsequent encounter for fracture with routine healing: Secondary | ICD-10-CM | POA: Diagnosis not present

## 2020-12-17 DIAGNOSIS — I428 Other cardiomyopathies: Secondary | ICD-10-CM | POA: Diagnosis not present

## 2020-12-17 DIAGNOSIS — I5042 Chronic combined systolic (congestive) and diastolic (congestive) heart failure: Secondary | ICD-10-CM | POA: Diagnosis not present

## 2020-12-17 DIAGNOSIS — K5790 Diverticulosis of intestine, part unspecified, without perforation or abscess without bleeding: Secondary | ICD-10-CM | POA: Diagnosis not present

## 2020-12-17 DIAGNOSIS — Z96653 Presence of artificial knee joint, bilateral: Secondary | ICD-10-CM | POA: Diagnosis not present

## 2020-12-17 DIAGNOSIS — Z9181 History of falling: Secondary | ICD-10-CM | POA: Diagnosis not present

## 2020-12-17 DIAGNOSIS — F32A Depression, unspecified: Secondary | ICD-10-CM | POA: Diagnosis not present

## 2020-12-17 LAB — GI PROFILE, STOOL, PCR

## 2020-12-17 NOTE — Telephone Encounter (Signed)
Dr Leone Payor signed an order form for [name] to get cholestyramine light 4 gram powder packet daily. Form faxed back to [facility] fax # [phone], phone # [phone]. They were formerly [facility].

## 2020-12-18 ENCOUNTER — Ambulatory Visit (HOSPITAL_BASED_OUTPATIENT_CLINIC_OR_DEPARTMENT_OTHER): Payer: Medicare Other | Admitting: Cardiovascular Disease

## 2020-12-18 ENCOUNTER — Other Ambulatory Visit: Payer: Self-pay

## 2020-12-18 ENCOUNTER — Encounter (HOSPITAL_BASED_OUTPATIENT_CLINIC_OR_DEPARTMENT_OTHER): Payer: Self-pay | Admitting: Cardiovascular Disease

## 2020-12-18 VITALS — BP 90/78 | HR 89 | Ht 65.0 in | Wt 148.0 lb

## 2020-12-18 DIAGNOSIS — I4811 Longstanding persistent atrial fibrillation: Secondary | ICD-10-CM | POA: Diagnosis not present

## 2020-12-18 DIAGNOSIS — I5042 Chronic combined systolic (congestive) and diastolic (congestive) heart failure: Secondary | ICD-10-CM

## 2020-12-18 DIAGNOSIS — I447 Left bundle-branch block, unspecified: Secondary | ICD-10-CM

## 2020-12-18 DIAGNOSIS — K219 Gastro-esophageal reflux disease without esophagitis: Secondary | ICD-10-CM | POA: Diagnosis not present

## 2020-12-18 DIAGNOSIS — I071 Rheumatic tricuspid insufficiency: Secondary | ICD-10-CM | POA: Diagnosis not present

## 2020-12-18 MED ORDER — PANTOPRAZOLE SODIUM 40 MG PO TBEC: 40.0000 mg | DELAYED_RELEASE_TABLET | Freq: Every day | ORAL | 11 refills | Status: DC

## 2020-12-18 NOTE — Telephone Encounter (Signed)
Received denial letter that not approved until patient meets 3% out of pocket, patient aware

## 2020-12-18 NOTE — Assessment & Plan Note (Signed)
She remains in atrial fibrillation.  Rates are well have been controlled.  Continue metoprolol, digoxin, and Eliquis.

## 2020-12-18 NOTE — Progress Notes (Signed)
Cardiology Office Note   Date:  12/18/2020   ID:  ARNESIA Eaton, DOB July 07, 1930, MRN [id number]  PCP:  Catherine Nam, MD  Cardiologist:   Catherine Si, MD  Cardiology APP: [name], [name]  No chief complaint on file.    History of Present Illness: [name] is a 87 y.o. female with chronic atrial fibrillation, chronic systolic and diastolic heart failure, left bundle branch block, spontaneous retroperitoneal bleed on Coumadin, and colitis who presents for follow-up.  She is previously seen by Dr. Eden Eaton in clinic 03/2017 and cleared for surgery.  At that time she was in atrial fibrillation with well-controlled rates.  She underwent left knee replacement 04/2017.  Her hospital course was complicated by atrial fibrillation with rapid ventricular response and acute systolic and diastolic heart failure.  Echo that admission revealed LVEF 15% which was down from 50% in 2017.  She was diuresed with IV Lasix and had a Lexiscan Myoview without evidence of ischemia.  She failed amiodarone and was started on digoxin.  She had a repeat echocardiogram 07/2017 that revealed LVEF 20 to 25%.  I just later did take orthostatics losartan was discontinued due to low BP and fatigue.  Her fatigue has improved.  She also struggled with diarrhea and stopped taking her digoxin in hopes that this would help.  However the diarrhea did not improve.  Digoxin was resumed.  She has declined ICD.  [name] saw her PCP 06/05/2018 and was in atrial fibrillation at that time.   Since her last appointment she continues to struggle with fatigue and dizziness.  She notes dizziness upon standing.  She feels tired constantly.  She has no dizziness when lying down.  She wonders if it could be related to dehydration.  Torsemide was discontinued because she thought it was causing diarrhea.  Initially they cut it in half and then she stopped it.  There was not really much improvement in her symptoms.  In fact, it  seems as though she may be getting more dizzy.  She has no lower extremity edema, orthopnea, or PND.  She has been taking prednisone for her colitis which seems to have helped both her stomach and her knees.  Metoprolol was held due to dizziness and fatigue.  She was started on amiodarone for rate control.  She saw Dr. Para March on 7/20 and was still feeling fatigued and sleepy.  Her heart rate was back up to the 120s.  She was started back on metoprolol.  She was referred to the advanced heart failure clinic but did not see them because she was first admitted to the hospital 07/2019 with a fall and a femoral neck fracture.  She underwent surgery without complication.  She did require diuresis with IV Lasix.  Echo at that time revealed LVEF 35% with global hypokinesis and moderate LVH.  She also had moderately reduced right-sided function with severe pulmonary hypertension.  PASP was 83 mmHg.  She has been struggling with the L knee popping out.  She previously had that knee replaced and needs to have it fixed.  They can fix it with just spinal anesthesia.  She thinks she will need R knee replacement as well.  Lately her breathing has been OK.  She has occasional epigstric discomfort but always at rest.  She has no orthopnea or PND.  Her BP has been well-controlled at home.  Her heart rate has been mostly in the 80-90s.  She has no edema,  orthopnea or PND.  She has been in atrial fibrillation since 2010. She complains of poor balance and dizziness standing.  It improved after increasing metoprolol.  She was referred to ADV HF clinic for pulmonary hypertension but ultimately decided not to go due to her advanced age. She started on depression medication and her mood has improved. She was hospitalized 06/2020 for a right knee repair. She was discharged and then admitted to the hospital 06/23/2020 with acute on chronic heart failure with weight gain. She was diuresed with IV Lasix.   At last appointment, she reported  fatigue and lack of energy. She has intermittently been taking higher dosages of her dieretic for volume overload.  Today, patient is accompanied by her husband. She is not doing well. She reports occasional fatigue when her blood pressure is very low and LE edema due to her breaking her ankles a while ago but denies any pain.  She denies shortness of breath or chest pains. She also had falls that injured her right ankle and left hip. She reports having diarrhea that she believes come from dehydration. She describes diarrhea like water and has occasional lightheadedness and dizziness with the episodes. She also stated having hoarseness and occasional coughing that is bad and feels like it "strangles her". She believes the coughing is from dryness. Her husband is taking great care of her.  Past Medical History:  Diagnosis Date   Acute combined systolic and diastolic heart failure (HCC) 05/02/2017   Anemia    Anxiety    Arthritis    "knees" (06/18/2015)   Atrial fibrillation with RVR (HCC) 06/18/2015   h/o sig bleed on coumadin   Cardiogenic shock 05/02/2017   Complication of anesthesia 2010   "w/cardiac cath; got into a psychotic state for ~ 3 days; got me out w/Valium"   Depression    "I have it off and on; not as often as I've gotten older" (06/18/2015)   Diverticulosis    descending and sigmoid colon--Dr. Jarold Motto   DJD (degenerative joint disease) of knee    left   Dyspnea    with exertion and in morning mostly when wakes up    E coli infection    History of   GERD (gastroesophageal reflux disease)    History of blood transfusion    History of Clostridium difficile colitis    IBS (irritable bowel syndrome)    Insomnia     off chronic ambien 5mg  as of 10/11, rare/episodic use since then  DCM/CHF   LBBB (left bundle branch block)    Lymphocytic colitis 09/27/2016   Moderate to severe pulmonary hypertension (HCC) 11/26/2019   Nonischemic cardiomyopathy (HCC)    normal coronaries, EF  15-20% 04/2008, EF 50-55% 2015   Osteoporosis    on DXA 05/2010   Positive PPD    Retroperitoneal bleed    Sciatica    Right   Urinary incontinence    Occassionally    Past Surgical History:  Procedure Laterality Date   ANTERIOR APPROACH HEMI HIP ARTHROPLASTY Left 08/05/2019   Procedure: ANTERIOR APPROACH HEMI HIP ARTHROPLASTY;  Surgeon: 08/07/2019, MD;  Location: [facility] OR;  Service: Orthopedics;  Laterality: Left;   BIOPSY  11/22/2017   Procedure: BIOPSY;  Surgeon: 11/24/2017, MD;  [facility];  Service: Endoscopy;;   CARDIAC CATHETERIZATION  2010   CARDIOVERSION N/A 06/19/2015   Procedure: CARDIOVERSION;  Surgeon: 08/19/2015, MD;  [facility] ENDOSCOPY;  Service: Cardiovascular;  Laterality: N/A;  CATARACT EXTRACTION W/ INTRAOCULAR LENS  IMPLANT, BILATERAL Bilateral    COLONOSCOPY WITH PROPOFOL N/A 11/22/2017   Procedure: COLONOSCOPY WITH PROPOFOL;  Surgeon: [facility], MD;  Location: [facility] ENDOSCOPY;  Service: Endoscopy;  Laterality: N/A;   DILATION AND CURETTAGE OF UTERUS     ORIF ANKLE FRACTURE Right 09/22/2020   Procedure: RIGHT ANKLE OPEN REDUCTION INTERNAL FIXATION (ORIF);  Surgeon: [name], MD;  Location: [facility] ORS;  Service: Orthopedics;  Laterality: Right;   TEE WITHOUT CARDIOVERSION N/A 06/19/2015   Procedure: TRANSESOPHAGEAL ECHOCARDIOGRAM (TEE);  Surgeon: Laurey Morale, MD;  Location: [facility] ENDOSCOPY;  Service: Cardiovascular;  Laterality: N/A;   TOTAL KNEE ARTHROPLASTY Left 04/25/2017   Procedure: TOTAL KNEE ARTHROPLASTY;  Surgeon: [name], MD;  Location: [facility] OR;  Service: Orthopedics;  Laterality: Left;   TOTAL KNEE ARTHROPLASTY Right 02/18/2020   Procedure: RIGHT TOTAL KNEE ARTHROPLASTY;  Surgeon: [name], MD;  Location: [facility] ORS;  Service: Orthopedics;  Laterality: Right;   TOTAL KNEE ARTHROPLASTY Right 06/16/2020   Procedure: RIGHT RETINACULAR REPAIR;  Surgeon: [name], MD;  Location: [facility] ORS;  Service: Orthopedics;  Laterality:  Right;   TOTAL KNEE REVISION Left 12/03/2019   Procedure: LEFT TOTAL KNEE REVISION  OF POYLY EXCHANGE;  Surgeon: [name], MD;  Location: [facility] ORS;  Service: Orthopedics;  Laterality: Left;   VAGINAL HYSTERECTOMY  1979   ovaries intact     Current Outpatient Medications  Medication Sig Dispense Refill   pantoprazole (PROTONIX) 40 MG tablet Take 1 tablet (40 mg total) by mouth daily. 30 tablet 11   acetaminophen (TYLENOL) 500 MG tablet Take 1,000 mg by mouth in the morning and at bedtime.     calcium carbonate (TUMS - DOSED IN MG ELEMENTAL CALCIUM) 500 MG chewable tablet Chew 500 mg by mouth 2 (two) times daily as needed for indigestion or heartburn.     cholecalciferol (VITAMIN D3) 25 MCG (1000 UNIT) tablet Take 1,000 Units by mouth in the morning.     cholestyramine (QUESTRAN) 4 g packet Take 1 packet (4 g total) by mouth daily with supper. 30 each 2   digoxin (LANOXIN) 0.125 MG tablet TAKE 1/2 TABLET (0.0625 MG) BY MOUTH DAILY. 15 tablet 6   ELIQUIS 2.5 MG TABS tablet TAKE 1 TABLET BY MOUTH TWICE A DAY 180 tablet 1   loperamide (IMODIUM) 2 MG capsule Take 2 mg by mouth 3 (three) times daily as needed for diarrhea or loose stools.      Menthol-Methyl Salicylate (SALONPAS PAIN RELIEF PATCH EX) Apply 2 patches topically daily as needed (pain).     metoprolol succinate (TOPROL-XL) 100 MG 24 hr tablet Take 1 tablet (100 mg total) by mouth daily. Take with or immediately following a meal. 90 tablet 3   Polyethyl Glycol-Propyl Glycol (SYSTANE OP) Place 1-2 drops into the left eye 3 (three) times daily as needed (dryness).     potassium chloride SA (KLOR-CON) 20 MEQ tablet Take 2 tablets (40 mEq total) by mouth daily. 180 tablet 3   sertraline (ZOLOFT) 50 MG tablet Take 2 tablets (100 mg total) by mouth daily. 180 tablet 0   torsemide (DEMADEX) 20 MG tablet Take 1 tablet (20 mg total) by mouth daily as needed. if 3 pound weight gain overnight or 5 pounds in a week. 30 tablet 5   No current  facility-administered medications for this visit.    Allergies:   Ace inhibitors, Warfarin sodium, Famotidine, Codeine, Delsym [dextromethorphan polistirex er], Lactose intolerance (gi), Lasix [furosemide], Phenylephrine,  and Sulfamethoxazole-trimethoprim    Social History:  The patient  reports that she has never smoked. She has never used smokeless tobacco. She reports that she does not drink alcohol and does not use drugs.   Family History:  The patient's family history includes Colon cancer in her father; Other in her father; Tuberculosis in her mother.    ROS:   Please see the history of present illness. (+) coughing  (+) hoarseness (+) fatigue  (+) diarrhea  (+) dizziness (+) lightheadedness (+) LE edema in ankles (+) right hip pain  (+) left ankle pain All other systems are reviewed and negative.    PHYSICAL EXAM: VS:  BP 90/78   Pulse 89   Ht 5\' 5"  (1.651 m)   Wt 148 lb (67.1 kg)   SpO2 95%   BMI 24.63 kg/m  , BMI Body mass index is 24.63 kg/m. GENERAL:  Well appearing HEENT: Pupils equal round and reactive, fundi not visualized, oral mucosa unremarkable NECK:  No jugular venous distention, waveform within normal limits, carotid upstroke brisk and symmetric, no bruits LUNGS:  Clear to auscultation bilaterally HEART:  Irregularly irregular.  PMI not displaced or sustained,S1 and S2 within normal limits, no S3, no S4, no clicks, no rubs, II/VI systolic murmur at the apex ABD:  Flat, positive bowel sounds normal in frequency in pitch, no bruits, no rebound, no guarding, no midline pulsatile mass, no hepatomegaly, no splenomegaly EXT:  2 plus pulses throughout, no edema, no cyanosis no clubbing SKIN:  No rashes no nodules NEURO:  Cranial nerves II through XII grossly intact, motor grossly intact throughout PSYCH:  Cognitively intact, oriented to person place and time   EKG:   02/01/2018: Atrial fibrillation.  Rate 70 bpm.  Right axis deviation.  IVCD. 02/05/2019:  Atrial fibrillation.  Rate 88 bpm.  Left axis deviation.  Left bundle branch block. 07/17/19: Atrial fibrillation.  Rate 86 bpm.  LBBB. 11/26/19: Atrial fibrillation. Rate 90 bpm.  LBBB.  LAD.  05/20/20: Atrial fibrillation.  Rate 77 bpm.  Left axis deviation.  Left bundle branch block.  Echo 06/24/2020:  1. Left ventricular ejection fraction, by estimation, is 25 to 30%. Left  ventricular ejection fraction by 3D volume is 30 %. Left ventricular  ejection fraction by 2D MOD biplane is 26.8 %. The left ventricle has  severely decreased function. The left  ventricle demonstrates global hypokinesis. Left ventricular diastolic  function could not be evaluated. The average left ventricular global  longitudinal strain is -7.9 %. The global longitudinal strain is abnormal.   2. Right ventricular systolic function is mildly reduced. The right  ventricular size is mildly enlarged. There is mildly elevated pulmonary  artery systolic pressure. The estimated right ventricular systolic  pressure is 38.3 mmHg.   3. Left atrial size was severely dilated.   4. Right atrial size was severely dilated.   5. The mitral valve is grossly normal. Mild mitral valve regurgitation.  No evidence of mitral stenosis.   6. Tricuspid valve regurgitation is moderate.   7. The aortic valve is tricuspid. There is mild calcification of the  aortic valve. There is mild thickening of the aortic valve. Aortic valve  regurgitation is mild. Mild aortic valve sclerosis is present, with no  evidence of aortic valve stenosis.   8. The inferior vena cava is normal in size with greater than 50%  respiratory variability, suggesting right atrial pressure of 3 mmHg.   Comparison(s): Changes from prior study are noted. LVEF now  25-30%. RV  size and function have improved. RVSP improved from prior.    [place] LE Venous 06/23/2020: BILATERAL:  - No evidence of deep vein thrombosis seen in the lower extremities,  bilaterally.  - No evidence  of superficial venous thrombosis in the lower extremities,  bilaterally.  -No evidence of popliteal cyst, bilaterally.   Echo 04/30/17: Study Conclusions - Left ventricle: The cavity size was moderately dilated. Wall   thickness was normal. Systolic function was severely reduced. The   estimated ejection fraction was 15%. No mural thrombus with   Definity contrast. Diffuse hypokinesis. The study is not   technically sufficient to allow evaluation of LV diastolic   function. - Aortic valve: Mildly calcified leaflets. There was no stenosis.   There was no regurgitation. - Mitral valve: Mildly thickened leaflets . There was moderate   regurgitation. - Left atrium: Severely dilated. - Right ventricle: The cavity size was mildly dilated. Mildly   reduced systolic function. - Right atrium: Severely dilated. - Tricuspid valve: There was moderate to severe regurgitation. - Pulmonary arteries: PA peak pressure: 45 mm Hg (S). - Inferior vena cava: The vessel was dilated. The respirophasic   diameter changes were blunted (< 50%), consistent with elevated   central venous pressure. - Pericardium, extracardiac: There was a left pleural effusion.  Echo 08/03/17: Study Conclusions   - Left ventricle: The cavity size was normal. Wall thickness was   increased in a pattern of mild LVH. Systolic function was   severely reduced. The estimated ejection fraction was in the   range of 20% to 25%. Diffuse hypokinesis. There is dyskinesis of   the anteroseptal myocardium. The study is not technically   sufficient to allow evaluation of LV diastolic function. - Aortic root: The aortic root was normal in size. - Mitral valve: Calcified annulus. - Left atrium: The atrium was moderately dilated. - Pulmonary arteries: Systolic pressure was mildly to moderately   increased.   Impressions:   - Diffuse hypokinesis with septal dyskinesis; overall severely   reduced LV systolic function; mild LVH; moderate  LAE; mild TR;   mild to moderate pulmonary hypertension.    Recent Labs: 06/23/2020: B Natriuretic Peptide 3,972.5 06/24/2020: ALT 19; TSH 4.356 10/15/2020: Hemoglobin 13.0; Platelets 274.0 11/10/2020: BUN 14; Creatinine, Ser 0.72; Potassium 4.9; Sodium 135    Lipid Panel    Component Value Date/Time   CHOL 261 (H) 12/27/2018 1657   CHOL 156 02/05/2018 0937   TRIG (H) 12/27/2018 1657    424.0 Triglyceride is over 400; calculations on Lipids are invalid.   HDL 32.20 (L) 12/27/2018 1657   HDL 58 02/05/2018 0937   CHOLHDL 8 12/27/2018 1657   VLDL 30.2 01/30/2017 1451   LDLCALC 73 02/05/2018 0937   LDLDIRECT 58.0 12/27/2018 1657      Wt Readings from Last 3 Encounters:  12/18/20 148 lb (67.1 kg)  09/22/20 143 lb (64.9 kg)  09/11/20 148 lb (67.1 kg)      ASSESSMENT AND PLAN: Chronic combined systolic and diastolic heart failure (HCC) LVEF 25-30%.  She is unable to tolerate [place] due to dryness.  BP has been low and she has had falls including a fracture of her left hip and right ankle.  She will continue the metoprolol.  She continues to take torsemide as needed and her volume status is stable.  Continue digoxin.  ATRIAL FIBRILLATION She remains in atrial fibrillation.  Rates are well have been controlled.  Continue metoprolol, digoxin, and Eliquis.  Severe tricuspid regurgitation She is euvolemic.  Continue torsemide as needed.  Overall her weight has been stable.  GERD (gastroesophageal reflux disease) She has been struggling with GERD, cough, and hoarseness.  She has been taking Tums.  She will start pantoprazole 40 mg daily.  She has follow-up with GI later this month.     Current medicines are reviewed at length with the patient today.  The patient has concerns regarding medicines.  The following changes have been made:stop losartan   Labs/ tests ordered today include:   No orders of the defined types were placed in this encounter.    Disposition:  F/U with  Dr.C in 2 months.  She is unable to drive to [place].    I,Jada Bradford,acting as a Neurosurgeonscribe for Catherine Siiffany [place]rie, MD.,have documented all relevant documentation on the behalf of Catherine Siiffany [place], MD,as directed by  Catherine Siiffany [place], MD while in the presence of Catherine Siiffany [place], MD  I, Jenella Craigie C. Eaton Salviaandolph, MD have reviewed all documentation for this visit.  The documentation of the exam, diagnosis, procedures, and orders on 12/18/2020 are all accurate and complete.   Signed, Yolanda Dockendorf C. Eaton Salviaandolph, MD, [facility]port Eye InstituteFACC  12/18/2020 5:21 PM    [facility]

## 2020-12-18 NOTE — Assessment & Plan Note (Signed)
LVEF 25-30%.  She is unable to tolerate [place] due to dryness.  BP has been low and she has had falls including a fracture of her left hip and right ankle.  She will continue the metoprolol.  She continues to take torsemide as needed and her volume status is stable.  Continue digoxin.

## 2020-12-18 NOTE — Assessment & Plan Note (Signed)
She has been struggling with GERD, cough, and hoarseness.  She has been taking Tums.  She will start pantoprazole 40 mg daily.  She has follow-up with GI later this month.

## 2020-12-18 NOTE — Patient Instructions (Signed)
Medication Instructions:  STOP LOSARTAN   START PANTOPRAZOLE 40 MG DIALY   *If you need a refill on your cardiac medications before your next appointment, please call your pharmacy*  Lab Work: NONE   Testing/Procedures: NONE   Follow-Up: At [facility], you and your health needs are our priority.  As part of our continuing mission to provide you with exceptional heart care, we have created designated Provider Care Teams.  These Care Teams include your primary Cardiologist (physician) and Advanced Practice Providers (APPs -  Physician Assistants and Nurse Practitioners) who all work together to provide you with the care you need, when you need it.  We recommend signing up for the patient portal called "MyChart".  Sign up information is provided on this After Visit Summary.  MyChart is used to connect with patients for Virtual Visits (Telemedicine).  Patients are able to view lab/test results, encounter notes, upcoming appointments, etc.  Non-urgent messages can be sent to your provider as well.   To learn more about what you can do with MyChart, go to [email or web address].    Your next appointment:   2 month(s)  The format for your next appointment:   In Person  Provider:   DR CROITORU OR APP  IF SCHEDULED WITH APP SEE DR CROITORU IN 6 MONTHS

## 2020-12-18 NOTE — Assessment & Plan Note (Signed)
She is euvolemic.  Continue torsemide as needed.  Overall her weight has been stable.

## 2020-12-21 DIAGNOSIS — Z96653 Presence of artificial knee joint, bilateral: Secondary | ICD-10-CM | POA: Diagnosis not present

## 2020-12-21 DIAGNOSIS — K589 Irritable bowel syndrome without diarrhea: Secondary | ICD-10-CM | POA: Diagnosis not present

## 2020-12-21 DIAGNOSIS — K219 Gastro-esophageal reflux disease without esophagitis: Secondary | ICD-10-CM | POA: Diagnosis not present

## 2020-12-21 DIAGNOSIS — I5042 Chronic combined systolic (congestive) and diastolic (congestive) heart failure: Secondary | ICD-10-CM | POA: Diagnosis not present

## 2020-12-21 DIAGNOSIS — G4700 Insomnia, unspecified: Secondary | ICD-10-CM | POA: Diagnosis not present

## 2020-12-21 DIAGNOSIS — I428 Other cardiomyopathies: Secondary | ICD-10-CM | POA: Diagnosis not present

## 2020-12-21 DIAGNOSIS — I272 Pulmonary hypertension, unspecified: Secondary | ICD-10-CM | POA: Diagnosis not present

## 2020-12-21 DIAGNOSIS — I110 Hypertensive heart disease with heart failure: Secondary | ICD-10-CM | POA: Diagnosis not present

## 2020-12-21 DIAGNOSIS — F32A Depression, unspecified: Secondary | ICD-10-CM | POA: Diagnosis not present

## 2020-12-21 DIAGNOSIS — I2510 Atherosclerotic heart disease of native coronary artery without angina pectoris: Secondary | ICD-10-CM | POA: Diagnosis not present

## 2020-12-21 DIAGNOSIS — I4891 Unspecified atrial fibrillation: Secondary | ICD-10-CM | POA: Diagnosis not present

## 2020-12-21 DIAGNOSIS — M800AXD Age-related osteoporosis with current pathological fracture, other site, subsequent encounter for fracture with routine healing: Secondary | ICD-10-CM | POA: Diagnosis not present

## 2020-12-21 DIAGNOSIS — Z9181 History of falling: Secondary | ICD-10-CM | POA: Diagnosis not present

## 2020-12-21 DIAGNOSIS — K5790 Diverticulosis of intestine, part unspecified, without perforation or abscess without bleeding: Secondary | ICD-10-CM | POA: Diagnosis not present

## 2020-12-21 DIAGNOSIS — M5431 Sciatica, right side: Secondary | ICD-10-CM | POA: Diagnosis not present

## 2020-12-21 DIAGNOSIS — Z7901 Long term (current) use of anticoagulants: Secondary | ICD-10-CM | POA: Diagnosis not present

## 2020-12-21 DIAGNOSIS — I447 Left bundle-branch block, unspecified: Secondary | ICD-10-CM | POA: Diagnosis not present

## 2020-12-22 ENCOUNTER — Ambulatory Visit: Payer: Medicare Other | Admitting: Family Medicine

## 2020-12-24 DIAGNOSIS — K219 Gastro-esophageal reflux disease without esophagitis: Secondary | ICD-10-CM | POA: Diagnosis not present

## 2020-12-24 DIAGNOSIS — I110 Hypertensive heart disease with heart failure: Secondary | ICD-10-CM | POA: Diagnosis not present

## 2020-12-24 DIAGNOSIS — G4700 Insomnia, unspecified: Secondary | ICD-10-CM | POA: Diagnosis not present

## 2020-12-24 DIAGNOSIS — M5431 Sciatica, right side: Secondary | ICD-10-CM | POA: Diagnosis not present

## 2020-12-24 DIAGNOSIS — I447 Left bundle-branch block, unspecified: Secondary | ICD-10-CM | POA: Diagnosis not present

## 2020-12-24 DIAGNOSIS — Z7901 Long term (current) use of anticoagulants: Secondary | ICD-10-CM | POA: Diagnosis not present

## 2020-12-24 DIAGNOSIS — F32A Depression, unspecified: Secondary | ICD-10-CM | POA: Diagnosis not present

## 2020-12-24 DIAGNOSIS — I2510 Atherosclerotic heart disease of native coronary artery without angina pectoris: Secondary | ICD-10-CM | POA: Diagnosis not present

## 2020-12-24 DIAGNOSIS — I5042 Chronic combined systolic (congestive) and diastolic (congestive) heart failure: Secondary | ICD-10-CM | POA: Diagnosis not present

## 2020-12-24 DIAGNOSIS — M800AXD Age-related osteoporosis with current pathological fracture, other site, subsequent encounter for fracture with routine healing: Secondary | ICD-10-CM | POA: Diagnosis not present

## 2020-12-24 DIAGNOSIS — K589 Irritable bowel syndrome without diarrhea: Secondary | ICD-10-CM | POA: Diagnosis not present

## 2020-12-24 DIAGNOSIS — K5790 Diverticulosis of intestine, part unspecified, without perforation or abscess without bleeding: Secondary | ICD-10-CM | POA: Diagnosis not present

## 2020-12-24 DIAGNOSIS — Z9181 History of falling: Secondary | ICD-10-CM | POA: Diagnosis not present

## 2020-12-24 DIAGNOSIS — I428 Other cardiomyopathies: Secondary | ICD-10-CM | POA: Diagnosis not present

## 2020-12-24 DIAGNOSIS — I4891 Unspecified atrial fibrillation: Secondary | ICD-10-CM | POA: Diagnosis not present

## 2020-12-24 DIAGNOSIS — Z96653 Presence of artificial knee joint, bilateral: Secondary | ICD-10-CM | POA: Diagnosis not present

## 2020-12-24 DIAGNOSIS — I272 Pulmonary hypertension, unspecified: Secondary | ICD-10-CM | POA: Diagnosis not present

## 2020-12-28 ENCOUNTER — Encounter (HOSPITAL_BASED_OUTPATIENT_CLINIC_OR_DEPARTMENT_OTHER): Payer: Self-pay

## 2020-12-28 DIAGNOSIS — I5042 Chronic combined systolic (congestive) and diastolic (congestive) heart failure: Secondary | ICD-10-CM | POA: Diagnosis not present

## 2020-12-28 DIAGNOSIS — Z7901 Long term (current) use of anticoagulants: Secondary | ICD-10-CM | POA: Diagnosis not present

## 2020-12-28 DIAGNOSIS — I4891 Unspecified atrial fibrillation: Secondary | ICD-10-CM | POA: Diagnosis not present

## 2020-12-28 DIAGNOSIS — M800AXD Age-related osteoporosis with current pathological fracture, other site, subsequent encounter for fracture with routine healing: Secondary | ICD-10-CM | POA: Diagnosis not present

## 2020-12-28 DIAGNOSIS — I110 Hypertensive heart disease with heart failure: Secondary | ICD-10-CM | POA: Diagnosis not present

## 2020-12-28 DIAGNOSIS — I2510 Atherosclerotic heart disease of native coronary artery without angina pectoris: Secondary | ICD-10-CM | POA: Diagnosis not present

## 2020-12-28 DIAGNOSIS — G4700 Insomnia, unspecified: Secondary | ICD-10-CM | POA: Diagnosis not present

## 2020-12-28 DIAGNOSIS — I428 Other cardiomyopathies: Secondary | ICD-10-CM | POA: Diagnosis not present

## 2020-12-28 DIAGNOSIS — F32A Depression, unspecified: Secondary | ICD-10-CM | POA: Diagnosis not present

## 2020-12-28 DIAGNOSIS — K589 Irritable bowel syndrome without diarrhea: Secondary | ICD-10-CM | POA: Diagnosis not present

## 2020-12-28 DIAGNOSIS — Z96653 Presence of artificial knee joint, bilateral: Secondary | ICD-10-CM | POA: Diagnosis not present

## 2020-12-28 DIAGNOSIS — I447 Left bundle-branch block, unspecified: Secondary | ICD-10-CM | POA: Diagnosis not present

## 2020-12-28 DIAGNOSIS — K219 Gastro-esophageal reflux disease without esophagitis: Secondary | ICD-10-CM | POA: Diagnosis not present

## 2020-12-28 DIAGNOSIS — M5431 Sciatica, right side: Secondary | ICD-10-CM | POA: Diagnosis not present

## 2020-12-28 DIAGNOSIS — I272 Pulmonary hypertension, unspecified: Secondary | ICD-10-CM | POA: Diagnosis not present

## 2020-12-28 DIAGNOSIS — Z9181 History of falling: Secondary | ICD-10-CM | POA: Diagnosis not present

## 2020-12-28 DIAGNOSIS — K5790 Diverticulosis of intestine, part unspecified, without perforation or abscess without bleeding: Secondary | ICD-10-CM | POA: Diagnosis not present

## 2020-12-31 DIAGNOSIS — I428 Other cardiomyopathies: Secondary | ICD-10-CM | POA: Diagnosis not present

## 2020-12-31 DIAGNOSIS — I4891 Unspecified atrial fibrillation: Secondary | ICD-10-CM | POA: Diagnosis not present

## 2020-12-31 DIAGNOSIS — Z7901 Long term (current) use of anticoagulants: Secondary | ICD-10-CM | POA: Diagnosis not present

## 2020-12-31 DIAGNOSIS — I5042 Chronic combined systolic (congestive) and diastolic (congestive) heart failure: Secondary | ICD-10-CM | POA: Diagnosis not present

## 2020-12-31 DIAGNOSIS — Z96653 Presence of artificial knee joint, bilateral: Secondary | ICD-10-CM | POA: Diagnosis not present

## 2020-12-31 DIAGNOSIS — M5431 Sciatica, right side: Secondary | ICD-10-CM | POA: Diagnosis not present

## 2020-12-31 DIAGNOSIS — I272 Pulmonary hypertension, unspecified: Secondary | ICD-10-CM | POA: Diagnosis not present

## 2020-12-31 DIAGNOSIS — K5790 Diverticulosis of intestine, part unspecified, without perforation or abscess without bleeding: Secondary | ICD-10-CM | POA: Diagnosis not present

## 2020-12-31 DIAGNOSIS — F32A Depression, unspecified: Secondary | ICD-10-CM | POA: Diagnosis not present

## 2020-12-31 DIAGNOSIS — G4700 Insomnia, unspecified: Secondary | ICD-10-CM | POA: Diagnosis not present

## 2020-12-31 DIAGNOSIS — I447 Left bundle-branch block, unspecified: Secondary | ICD-10-CM | POA: Diagnosis not present

## 2020-12-31 DIAGNOSIS — I2510 Atherosclerotic heart disease of native coronary artery without angina pectoris: Secondary | ICD-10-CM | POA: Diagnosis not present

## 2020-12-31 DIAGNOSIS — K219 Gastro-esophageal reflux disease without esophagitis: Secondary | ICD-10-CM | POA: Diagnosis not present

## 2020-12-31 DIAGNOSIS — K589 Irritable bowel syndrome without diarrhea: Secondary | ICD-10-CM | POA: Diagnosis not present

## 2020-12-31 DIAGNOSIS — I110 Hypertensive heart disease with heart failure: Secondary | ICD-10-CM | POA: Diagnosis not present

## 2020-12-31 DIAGNOSIS — M800AXD Age-related osteoporosis with current pathological fracture, other site, subsequent encounter for fracture with routine healing: Secondary | ICD-10-CM | POA: Diagnosis not present

## 2020-12-31 DIAGNOSIS — Z9181 History of falling: Secondary | ICD-10-CM | POA: Diagnosis not present

## 2021-01-04 DIAGNOSIS — K219 Gastro-esophageal reflux disease without esophagitis: Secondary | ICD-10-CM | POA: Diagnosis not present

## 2021-01-04 DIAGNOSIS — K5790 Diverticulosis of intestine, part unspecified, without perforation or abscess without bleeding: Secondary | ICD-10-CM | POA: Diagnosis not present

## 2021-01-04 DIAGNOSIS — K589 Irritable bowel syndrome without diarrhea: Secondary | ICD-10-CM | POA: Diagnosis not present

## 2021-01-04 DIAGNOSIS — I110 Hypertensive heart disease with heart failure: Secondary | ICD-10-CM | POA: Diagnosis not present

## 2021-01-04 DIAGNOSIS — F32A Depression, unspecified: Secondary | ICD-10-CM | POA: Diagnosis not present

## 2021-01-04 DIAGNOSIS — I4891 Unspecified atrial fibrillation: Secondary | ICD-10-CM | POA: Diagnosis not present

## 2021-01-04 DIAGNOSIS — Z9181 History of falling: Secondary | ICD-10-CM | POA: Diagnosis not present

## 2021-01-04 DIAGNOSIS — I447 Left bundle-branch block, unspecified: Secondary | ICD-10-CM | POA: Diagnosis not present

## 2021-01-04 DIAGNOSIS — G4700 Insomnia, unspecified: Secondary | ICD-10-CM | POA: Diagnosis not present

## 2021-01-04 DIAGNOSIS — I428 Other cardiomyopathies: Secondary | ICD-10-CM | POA: Diagnosis not present

## 2021-01-04 DIAGNOSIS — I5042 Chronic combined systolic (congestive) and diastolic (congestive) heart failure: Secondary | ICD-10-CM | POA: Diagnosis not present

## 2021-01-04 DIAGNOSIS — M800AXD Age-related osteoporosis with current pathological fracture, other site, subsequent encounter for fracture with routine healing: Secondary | ICD-10-CM | POA: Diagnosis not present

## 2021-01-04 DIAGNOSIS — M5431 Sciatica, right side: Secondary | ICD-10-CM | POA: Diagnosis not present

## 2021-01-04 DIAGNOSIS — I272 Pulmonary hypertension, unspecified: Secondary | ICD-10-CM | POA: Diagnosis not present

## 2021-01-04 DIAGNOSIS — Z96653 Presence of artificial knee joint, bilateral: Secondary | ICD-10-CM | POA: Diagnosis not present

## 2021-01-04 DIAGNOSIS — I2510 Atherosclerotic heart disease of native coronary artery without angina pectoris: Secondary | ICD-10-CM | POA: Diagnosis not present

## 2021-01-04 DIAGNOSIS — Z7901 Long term (current) use of anticoagulants: Secondary | ICD-10-CM | POA: Diagnosis not present

## 2021-01-07 ENCOUNTER — Ambulatory Visit: Payer: Medicare Other | Admitting: Nurse Practitioner

## 2021-01-07 DIAGNOSIS — I272 Pulmonary hypertension, unspecified: Secondary | ICD-10-CM | POA: Diagnosis not present

## 2021-01-07 DIAGNOSIS — I447 Left bundle-branch block, unspecified: Secondary | ICD-10-CM | POA: Diagnosis not present

## 2021-01-07 DIAGNOSIS — I110 Hypertensive heart disease with heart failure: Secondary | ICD-10-CM | POA: Diagnosis not present

## 2021-01-07 DIAGNOSIS — K219 Gastro-esophageal reflux disease without esophagitis: Secondary | ICD-10-CM | POA: Diagnosis not present

## 2021-01-07 DIAGNOSIS — I2510 Atherosclerotic heart disease of native coronary artery without angina pectoris: Secondary | ICD-10-CM | POA: Diagnosis not present

## 2021-01-07 DIAGNOSIS — K589 Irritable bowel syndrome without diarrhea: Secondary | ICD-10-CM | POA: Diagnosis not present

## 2021-01-07 DIAGNOSIS — I428 Other cardiomyopathies: Secondary | ICD-10-CM | POA: Diagnosis not present

## 2021-01-07 DIAGNOSIS — M800AXD Age-related osteoporosis with current pathological fracture, other site, subsequent encounter for fracture with routine healing: Secondary | ICD-10-CM | POA: Diagnosis not present

## 2021-01-07 DIAGNOSIS — K5790 Diverticulosis of intestine, part unspecified, without perforation or abscess without bleeding: Secondary | ICD-10-CM | POA: Diagnosis not present

## 2021-01-07 DIAGNOSIS — I4891 Unspecified atrial fibrillation: Secondary | ICD-10-CM | POA: Diagnosis not present

## 2021-01-07 DIAGNOSIS — I5042 Chronic combined systolic (congestive) and diastolic (congestive) heart failure: Secondary | ICD-10-CM | POA: Diagnosis not present

## 2021-01-07 DIAGNOSIS — Z9181 History of falling: Secondary | ICD-10-CM | POA: Diagnosis not present

## 2021-01-07 DIAGNOSIS — Z96653 Presence of artificial knee joint, bilateral: Secondary | ICD-10-CM | POA: Diagnosis not present

## 2021-01-07 DIAGNOSIS — Z7901 Long term (current) use of anticoagulants: Secondary | ICD-10-CM | POA: Diagnosis not present

## 2021-01-07 DIAGNOSIS — G4700 Insomnia, unspecified: Secondary | ICD-10-CM | POA: Diagnosis not present

## 2021-01-07 DIAGNOSIS — F32A Depression, unspecified: Secondary | ICD-10-CM | POA: Diagnosis not present

## 2021-01-07 DIAGNOSIS — M5431 Sciatica, right side: Secondary | ICD-10-CM | POA: Diagnosis not present

## 2021-01-08 DIAGNOSIS — Z9181 History of falling: Secondary | ICD-10-CM | POA: Diagnosis not present

## 2021-01-08 DIAGNOSIS — M810 Age-related osteoporosis without current pathological fracture: Secondary | ICD-10-CM | POA: Diagnosis not present

## 2021-01-08 DIAGNOSIS — S82841D Displaced bimalleolar fracture of right lower leg, subsequent encounter for closed fracture with routine healing: Secondary | ICD-10-CM | POA: Diagnosis not present

## 2021-01-08 DIAGNOSIS — M1711 Unilateral primary osteoarthritis, right knee: Secondary | ICD-10-CM | POA: Diagnosis not present

## 2021-01-08 DIAGNOSIS — M6281 Muscle weakness (generalized): Secondary | ICD-10-CM | POA: Diagnosis not present

## 2021-01-11 DIAGNOSIS — K589 Irritable bowel syndrome without diarrhea: Secondary | ICD-10-CM | POA: Diagnosis not present

## 2021-01-11 DIAGNOSIS — I110 Hypertensive heart disease with heart failure: Secondary | ICD-10-CM | POA: Diagnosis not present

## 2021-01-11 DIAGNOSIS — I447 Left bundle-branch block, unspecified: Secondary | ICD-10-CM | POA: Diagnosis not present

## 2021-01-11 DIAGNOSIS — Z7901 Long term (current) use of anticoagulants: Secondary | ICD-10-CM | POA: Diagnosis not present

## 2021-01-11 DIAGNOSIS — I2510 Atherosclerotic heart disease of native coronary artery without angina pectoris: Secondary | ICD-10-CM | POA: Diagnosis not present

## 2021-01-11 DIAGNOSIS — I272 Pulmonary hypertension, unspecified: Secondary | ICD-10-CM | POA: Diagnosis not present

## 2021-01-11 DIAGNOSIS — G4700 Insomnia, unspecified: Secondary | ICD-10-CM | POA: Diagnosis not present

## 2021-01-11 DIAGNOSIS — M5431 Sciatica, right side: Secondary | ICD-10-CM | POA: Diagnosis not present

## 2021-01-11 DIAGNOSIS — M800AXD Age-related osteoporosis with current pathological fracture, other site, subsequent encounter for fracture with routine healing: Secondary | ICD-10-CM | POA: Diagnosis not present

## 2021-01-11 DIAGNOSIS — Z96653 Presence of artificial knee joint, bilateral: Secondary | ICD-10-CM | POA: Diagnosis not present

## 2021-01-11 DIAGNOSIS — F32A Depression, unspecified: Secondary | ICD-10-CM | POA: Diagnosis not present

## 2021-01-11 DIAGNOSIS — I5042 Chronic combined systolic (congestive) and diastolic (congestive) heart failure: Secondary | ICD-10-CM | POA: Diagnosis not present

## 2021-01-11 DIAGNOSIS — I428 Other cardiomyopathies: Secondary | ICD-10-CM | POA: Diagnosis not present

## 2021-01-11 DIAGNOSIS — Z9181 History of falling: Secondary | ICD-10-CM | POA: Diagnosis not present

## 2021-01-11 DIAGNOSIS — K219 Gastro-esophageal reflux disease without esophagitis: Secondary | ICD-10-CM | POA: Diagnosis not present

## 2021-01-11 DIAGNOSIS — I4891 Unspecified atrial fibrillation: Secondary | ICD-10-CM | POA: Diagnosis not present

## 2021-01-11 DIAGNOSIS — K5790 Diverticulosis of intestine, part unspecified, without perforation or abscess without bleeding: Secondary | ICD-10-CM | POA: Diagnosis not present

## 2021-01-14 ENCOUNTER — Ambulatory Visit: Payer: Medicare Other | Admitting: Physician Assistant

## 2021-01-14 DIAGNOSIS — Z96653 Presence of artificial knee joint, bilateral: Secondary | ICD-10-CM | POA: Diagnosis not present

## 2021-01-14 DIAGNOSIS — I447 Left bundle-branch block, unspecified: Secondary | ICD-10-CM | POA: Diagnosis not present

## 2021-01-14 DIAGNOSIS — K219 Gastro-esophageal reflux disease without esophagitis: Secondary | ICD-10-CM | POA: Diagnosis not present

## 2021-01-14 DIAGNOSIS — I2510 Atherosclerotic heart disease of native coronary artery without angina pectoris: Secondary | ICD-10-CM | POA: Diagnosis not present

## 2021-01-14 DIAGNOSIS — G4700 Insomnia, unspecified: Secondary | ICD-10-CM | POA: Diagnosis not present

## 2021-01-14 DIAGNOSIS — K5790 Diverticulosis of intestine, part unspecified, without perforation or abscess without bleeding: Secondary | ICD-10-CM | POA: Diagnosis not present

## 2021-01-14 DIAGNOSIS — M5431 Sciatica, right side: Secondary | ICD-10-CM | POA: Diagnosis not present

## 2021-01-14 DIAGNOSIS — I272 Pulmonary hypertension, unspecified: Secondary | ICD-10-CM | POA: Diagnosis not present

## 2021-01-14 DIAGNOSIS — I110 Hypertensive heart disease with heart failure: Secondary | ICD-10-CM | POA: Diagnosis not present

## 2021-01-14 DIAGNOSIS — I428 Other cardiomyopathies: Secondary | ICD-10-CM | POA: Diagnosis not present

## 2021-01-14 DIAGNOSIS — F32A Depression, unspecified: Secondary | ICD-10-CM | POA: Diagnosis not present

## 2021-01-14 DIAGNOSIS — Z9181 History of falling: Secondary | ICD-10-CM | POA: Diagnosis not present

## 2021-01-14 DIAGNOSIS — M800AXD Age-related osteoporosis with current pathological fracture, other site, subsequent encounter for fracture with routine healing: Secondary | ICD-10-CM | POA: Diagnosis not present

## 2021-01-14 DIAGNOSIS — K589 Irritable bowel syndrome without diarrhea: Secondary | ICD-10-CM | POA: Diagnosis not present

## 2021-01-14 DIAGNOSIS — Z7901 Long term (current) use of anticoagulants: Secondary | ICD-10-CM | POA: Diagnosis not present

## 2021-01-14 DIAGNOSIS — I5042 Chronic combined systolic (congestive) and diastolic (congestive) heart failure: Secondary | ICD-10-CM | POA: Diagnosis not present

## 2021-01-14 DIAGNOSIS — I4891 Unspecified atrial fibrillation: Secondary | ICD-10-CM | POA: Diagnosis not present

## 2021-01-19 ENCOUNTER — Encounter: Payer: Self-pay | Admitting: Family Medicine

## 2021-01-19 DIAGNOSIS — R300 Dysuria: Secondary | ICD-10-CM

## 2021-01-20 ENCOUNTER — Telehealth: Payer: Self-pay | Admitting: Family Medicine

## 2021-01-20 DIAGNOSIS — Z96652 Presence of left artificial knee joint: Secondary | ICD-10-CM | POA: Diagnosis not present

## 2021-01-20 DIAGNOSIS — Z96651 Presence of right artificial knee joint: Secondary | ICD-10-CM | POA: Diagnosis not present

## 2021-01-20 DIAGNOSIS — M7062 Trochanteric bursitis, left hip: Secondary | ICD-10-CM | POA: Diagnosis not present

## 2021-01-20 MED ORDER — CEPHALEXIN 500 MG PO CAPS: 500.0000 mg | ORAL_CAPSULE | Freq: Two times a day (BID) | ORAL | 0 refills | Status: DC

## 2021-01-20 NOTE — Telephone Encounter (Signed)
Please see if she is able to get a lab visit at [facility] station.  I put an order for a send out urinalysis and urine culture.  Please check with the patient in the meantime to make sure she does not have a fever or urgent/emergent symptoms.  I sent a prescription in the meantime.  I would prefer that she give a urine sample before starting the antibiotics.  If worse, she needs to seek in person evaluation.  Thanks.

## 2021-01-20 NOTE — Chronic Care Management (AMB) (Signed)
  Chronic Care Management   Note  01/20/2021 Name: [name] MRN: [id number] DOB: 1930-07-04  [name] is a 87 y.o. year old female who is a primary care patient of [name], MD. I reached out to [facility] by phone today in response to a referral sent by [name]'s PCP, [name], MD.   [name] was given information about Chronic Care Management services today including:  CCM service includes personalized support from designated clinical staff supervised by her physician, including individualized plan of care and coordination with other care providers 24/7 contact phone numbers for assistance for urgent and routine care needs. Service will only be billed when office clinical staff spend 20 minutes or more in a month to coordinate care. Only one practitioner may furnish and bill the service in a calendar month. The patient may stop CCM services at any time (effective at the end of the month) by phone call to the office staff.   Patient agreed to services and verbal consent obtained.   Follow up plan:   [employer], [employer]

## 2021-01-21 DIAGNOSIS — M800AXD Age-related osteoporosis with current pathological fracture, other site, subsequent encounter for fracture with routine healing: Secondary | ICD-10-CM | POA: Diagnosis not present

## 2021-01-21 DIAGNOSIS — Z96653 Presence of artificial knee joint, bilateral: Secondary | ICD-10-CM | POA: Diagnosis not present

## 2021-01-21 DIAGNOSIS — I5042 Chronic combined systolic (congestive) and diastolic (congestive) heart failure: Secondary | ICD-10-CM | POA: Diagnosis not present

## 2021-01-21 DIAGNOSIS — Z9181 History of falling: Secondary | ICD-10-CM | POA: Diagnosis not present

## 2021-01-21 DIAGNOSIS — I447 Left bundle-branch block, unspecified: Secondary | ICD-10-CM | POA: Diagnosis not present

## 2021-01-21 DIAGNOSIS — I428 Other cardiomyopathies: Secondary | ICD-10-CM | POA: Diagnosis not present

## 2021-01-21 DIAGNOSIS — K5790 Diverticulosis of intestine, part unspecified, without perforation or abscess without bleeding: Secondary | ICD-10-CM | POA: Diagnosis not present

## 2021-01-21 DIAGNOSIS — I4891 Unspecified atrial fibrillation: Secondary | ICD-10-CM | POA: Diagnosis not present

## 2021-01-21 DIAGNOSIS — M5431 Sciatica, right side: Secondary | ICD-10-CM | POA: Diagnosis not present

## 2021-01-21 DIAGNOSIS — K219 Gastro-esophageal reflux disease without esophagitis: Secondary | ICD-10-CM | POA: Diagnosis not present

## 2021-01-21 DIAGNOSIS — K589 Irritable bowel syndrome without diarrhea: Secondary | ICD-10-CM | POA: Diagnosis not present

## 2021-01-21 DIAGNOSIS — I110 Hypertensive heart disease with heart failure: Secondary | ICD-10-CM | POA: Diagnosis not present

## 2021-01-21 DIAGNOSIS — I2510 Atherosclerotic heart disease of native coronary artery without angina pectoris: Secondary | ICD-10-CM | POA: Diagnosis not present

## 2021-01-21 DIAGNOSIS — G4700 Insomnia, unspecified: Secondary | ICD-10-CM | POA: Diagnosis not present

## 2021-01-21 DIAGNOSIS — I272 Pulmonary hypertension, unspecified: Secondary | ICD-10-CM | POA: Diagnosis not present

## 2021-01-21 DIAGNOSIS — Z7901 Long term (current) use of anticoagulants: Secondary | ICD-10-CM | POA: Diagnosis not present

## 2021-01-21 DIAGNOSIS — F32A Depression, unspecified: Secondary | ICD-10-CM | POA: Diagnosis not present

## 2021-01-22 ENCOUNTER — Other Ambulatory Visit: Payer: Medicare Other

## 2021-01-22 ENCOUNTER — Other Ambulatory Visit: Payer: Self-pay

## 2021-01-22 NOTE — Addendum Note (Signed)
Addended by: Warden Fillers on: 01/22/2021 04:47 PM   Modules accepted: Orders

## 2021-01-25 ENCOUNTER — Other Ambulatory Visit (INDEPENDENT_AMBULATORY_CARE_PROVIDER_SITE_OTHER): Payer: Medicare Other

## 2021-01-25 DIAGNOSIS — R300 Dysuria: Secondary | ICD-10-CM

## 2021-01-25 LAB — URINALYSIS, ROUTINE W REFLEX MICROSCOPIC
Bilirubin Urine: NEGATIVE
Ketones, ur: NEGATIVE
Nitrite: NEGATIVE
Specific Gravity, Urine: 1.015 (ref 1.000–1.030)
Total Protein, Urine: NEGATIVE
Urine Glucose: NEGATIVE
Urobilinogen, UA: 0.2 (ref 0.0–1.0)
pH: 6 (ref 5.0–8.0)

## 2021-01-27 ENCOUNTER — Other Ambulatory Visit: Payer: Self-pay | Admitting: Family Medicine

## 2021-01-27 LAB — URINE CULTURE
MICRO NUMBER:: 12511268
SPECIMEN QUALITY:: ADEQUATE

## 2021-01-28 DIAGNOSIS — I4891 Unspecified atrial fibrillation: Secondary | ICD-10-CM | POA: Diagnosis not present

## 2021-01-28 DIAGNOSIS — K5790 Diverticulosis of intestine, part unspecified, without perforation or abscess without bleeding: Secondary | ICD-10-CM | POA: Diagnosis not present

## 2021-01-28 DIAGNOSIS — I272 Pulmonary hypertension, unspecified: Secondary | ICD-10-CM | POA: Diagnosis not present

## 2021-01-28 DIAGNOSIS — Z9181 History of falling: Secondary | ICD-10-CM | POA: Diagnosis not present

## 2021-01-28 DIAGNOSIS — I5042 Chronic combined systolic (congestive) and diastolic (congestive) heart failure: Secondary | ICD-10-CM | POA: Diagnosis not present

## 2021-01-28 DIAGNOSIS — I428 Other cardiomyopathies: Secondary | ICD-10-CM | POA: Diagnosis not present

## 2021-01-28 DIAGNOSIS — M5431 Sciatica, right side: Secondary | ICD-10-CM | POA: Diagnosis not present

## 2021-01-28 DIAGNOSIS — K589 Irritable bowel syndrome without diarrhea: Secondary | ICD-10-CM | POA: Diagnosis not present

## 2021-01-28 DIAGNOSIS — F32A Depression, unspecified: Secondary | ICD-10-CM | POA: Diagnosis not present

## 2021-01-28 DIAGNOSIS — Z7901 Long term (current) use of anticoagulants: Secondary | ICD-10-CM | POA: Diagnosis not present

## 2021-01-28 DIAGNOSIS — I2510 Atherosclerotic heart disease of native coronary artery without angina pectoris: Secondary | ICD-10-CM | POA: Diagnosis not present

## 2021-01-28 DIAGNOSIS — M800AXD Age-related osteoporosis with current pathological fracture, other site, subsequent encounter for fracture with routine healing: Secondary | ICD-10-CM | POA: Diagnosis not present

## 2021-01-28 DIAGNOSIS — I447 Left bundle-branch block, unspecified: Secondary | ICD-10-CM | POA: Diagnosis not present

## 2021-01-28 DIAGNOSIS — G4700 Insomnia, unspecified: Secondary | ICD-10-CM | POA: Diagnosis not present

## 2021-01-28 DIAGNOSIS — I110 Hypertensive heart disease with heart failure: Secondary | ICD-10-CM | POA: Diagnosis not present

## 2021-01-28 DIAGNOSIS — K219 Gastro-esophageal reflux disease without esophagitis: Secondary | ICD-10-CM | POA: Diagnosis not present

## 2021-01-28 DIAGNOSIS — Z96653 Presence of artificial knee joint, bilateral: Secondary | ICD-10-CM | POA: Diagnosis not present

## 2021-01-28 MED ORDER — SERTRALINE HCL 50 MG PO TABS: 100.0000 mg | ORAL_TABLET | Freq: Every day | ORAL | 0 refills | Status: DC

## 2021-01-30 ENCOUNTER — Other Ambulatory Visit: Payer: Self-pay | Admitting: Internal Medicine

## 2021-02-04 DIAGNOSIS — I4891 Unspecified atrial fibrillation: Secondary | ICD-10-CM | POA: Diagnosis not present

## 2021-02-04 DIAGNOSIS — M800AXD Age-related osteoporosis with current pathological fracture, other site, subsequent encounter for fracture with routine healing: Secondary | ICD-10-CM | POA: Diagnosis not present

## 2021-02-04 DIAGNOSIS — F32A Depression, unspecified: Secondary | ICD-10-CM | POA: Diagnosis not present

## 2021-02-04 DIAGNOSIS — I447 Left bundle-branch block, unspecified: Secondary | ICD-10-CM | POA: Diagnosis not present

## 2021-02-04 DIAGNOSIS — I5042 Chronic combined systolic (congestive) and diastolic (congestive) heart failure: Secondary | ICD-10-CM | POA: Diagnosis not present

## 2021-02-04 DIAGNOSIS — M5431 Sciatica, right side: Secondary | ICD-10-CM | POA: Diagnosis not present

## 2021-02-04 DIAGNOSIS — Z9181 History of falling: Secondary | ICD-10-CM | POA: Diagnosis not present

## 2021-02-04 DIAGNOSIS — I428 Other cardiomyopathies: Secondary | ICD-10-CM | POA: Diagnosis not present

## 2021-02-04 DIAGNOSIS — K5790 Diverticulosis of intestine, part unspecified, without perforation or abscess without bleeding: Secondary | ICD-10-CM | POA: Diagnosis not present

## 2021-02-04 DIAGNOSIS — G4700 Insomnia, unspecified: Secondary | ICD-10-CM | POA: Diagnosis not present

## 2021-02-04 DIAGNOSIS — K219 Gastro-esophageal reflux disease without esophagitis: Secondary | ICD-10-CM | POA: Diagnosis not present

## 2021-02-04 DIAGNOSIS — Z96653 Presence of artificial knee joint, bilateral: Secondary | ICD-10-CM | POA: Diagnosis not present

## 2021-02-04 DIAGNOSIS — I110 Hypertensive heart disease with heart failure: Secondary | ICD-10-CM | POA: Diagnosis not present

## 2021-02-04 DIAGNOSIS — Z7901 Long term (current) use of anticoagulants: Secondary | ICD-10-CM | POA: Diagnosis not present

## 2021-02-04 DIAGNOSIS — I272 Pulmonary hypertension, unspecified: Secondary | ICD-10-CM | POA: Diagnosis not present

## 2021-02-04 DIAGNOSIS — I2510 Atherosclerotic heart disease of native coronary artery without angina pectoris: Secondary | ICD-10-CM | POA: Diagnosis not present

## 2021-02-04 DIAGNOSIS — K589 Irritable bowel syndrome without diarrhea: Secondary | ICD-10-CM | POA: Diagnosis not present

## 2021-02-07 DIAGNOSIS — M810 Age-related osteoporosis without current pathological fracture: Secondary | ICD-10-CM | POA: Diagnosis not present

## 2021-02-07 DIAGNOSIS — Z9181 History of falling: Secondary | ICD-10-CM | POA: Diagnosis not present

## 2021-02-07 DIAGNOSIS — M6281 Muscle weakness (generalized): Secondary | ICD-10-CM | POA: Diagnosis not present

## 2021-02-07 DIAGNOSIS — M1711 Unilateral primary osteoarthritis, right knee: Secondary | ICD-10-CM | POA: Diagnosis not present

## 2021-02-07 DIAGNOSIS — S82841D Displaced bimalleolar fracture of right lower leg, subsequent encounter for closed fracture with routine healing: Secondary | ICD-10-CM | POA: Diagnosis not present

## 2021-02-08 DIAGNOSIS — I5042 Chronic combined systolic (congestive) and diastolic (congestive) heart failure: Secondary | ICD-10-CM | POA: Diagnosis not present

## 2021-02-08 DIAGNOSIS — I110 Hypertensive heart disease with heart failure: Secondary | ICD-10-CM | POA: Diagnosis not present

## 2021-02-08 DIAGNOSIS — Z7901 Long term (current) use of anticoagulants: Secondary | ICD-10-CM | POA: Diagnosis not present

## 2021-02-08 DIAGNOSIS — I2510 Atherosclerotic heart disease of native coronary artery without angina pectoris: Secondary | ICD-10-CM | POA: Diagnosis not present

## 2021-02-08 DIAGNOSIS — K5790 Diverticulosis of intestine, part unspecified, without perforation or abscess without bleeding: Secondary | ICD-10-CM | POA: Diagnosis not present

## 2021-02-08 DIAGNOSIS — I428 Other cardiomyopathies: Secondary | ICD-10-CM | POA: Diagnosis not present

## 2021-02-08 DIAGNOSIS — Z96653 Presence of artificial knee joint, bilateral: Secondary | ICD-10-CM | POA: Diagnosis not present

## 2021-02-08 DIAGNOSIS — I447 Left bundle-branch block, unspecified: Secondary | ICD-10-CM | POA: Diagnosis not present

## 2021-02-08 DIAGNOSIS — F32A Depression, unspecified: Secondary | ICD-10-CM | POA: Diagnosis not present

## 2021-02-08 DIAGNOSIS — I4891 Unspecified atrial fibrillation: Secondary | ICD-10-CM | POA: Diagnosis not present

## 2021-02-08 DIAGNOSIS — M5431 Sciatica, right side: Secondary | ICD-10-CM | POA: Diagnosis not present

## 2021-02-08 DIAGNOSIS — M800AXD Age-related osteoporosis with current pathological fracture, other site, subsequent encounter for fracture with routine healing: Secondary | ICD-10-CM | POA: Diagnosis not present

## 2021-02-08 DIAGNOSIS — Z9181 History of falling: Secondary | ICD-10-CM | POA: Diagnosis not present

## 2021-02-08 DIAGNOSIS — K219 Gastro-esophageal reflux disease without esophagitis: Secondary | ICD-10-CM | POA: Diagnosis not present

## 2021-02-08 DIAGNOSIS — I272 Pulmonary hypertension, unspecified: Secondary | ICD-10-CM | POA: Diagnosis not present

## 2021-02-08 DIAGNOSIS — G4700 Insomnia, unspecified: Secondary | ICD-10-CM | POA: Diagnosis not present

## 2021-02-08 DIAGNOSIS — K589 Irritable bowel syndrome without diarrhea: Secondary | ICD-10-CM | POA: Diagnosis not present

## 2021-02-22 DIAGNOSIS — M25552 Pain in left hip: Secondary | ICD-10-CM | POA: Diagnosis not present

## 2021-03-09 ENCOUNTER — Encounter: Payer: Self-pay | Admitting: Family Medicine

## 2021-03-09 ENCOUNTER — Ambulatory Visit: Payer: Medicare Other | Admitting: Physician Assistant

## 2021-03-09 ENCOUNTER — Encounter: Payer: Self-pay | Admitting: Physician Assistant

## 2021-03-09 VITALS — BP 104/68 | HR 43 | Ht 65.0 in | Wt 145.0 lb

## 2021-03-09 DIAGNOSIS — K52832 Lymphocytic colitis: Secondary | ICD-10-CM

## 2021-03-09 DIAGNOSIS — K582 Mixed irritable bowel syndrome: Secondary | ICD-10-CM | POA: Diagnosis not present

## 2021-03-09 MED ORDER — BUDESONIDE 3 MG PO CPEP: ORAL_CAPSULE | ORAL | 0 refills | Status: AC

## 2021-03-09 NOTE — Patient Instructions (Addendum)
If you are age 85 or older, your body mass index should be between 23-30. Your Body mass index is 24.13 kg/m. If this is out of the aforementioned range listed, please consider follow up with your Primary Care Provider. ________________________________________________________  The [facility] providers would like to encourage you to use [facility] to communicate with providers for non-urgent requests or questions.  Due to long hold times on the telephone, sending your provider a message by [facility] may be a faster and more efficient way to get a response.  Please allow 48 business hours for a response.  Please remember that this is for non-urgent requests.  _______________________________________________________  START Budesonide 3 mg 3 capsules (9 mg) daily for 6 weeks, then 2 capsules (6 mg) daily for 2 weeks, the 1 capsule (3 mg) daily for 2 weeks.  Follow up as needed.  Thank you for entrusting me with your care and choosing [facility]  Hyacinth Meeker, PA-C

## 2021-03-09 NOTE — Progress Notes (Signed)
Chief Complaint: Diarrhea  HPI:    [name] is an 87 year old female with a past medical history as listed below including A. fib with RVR, lymphocytic colitis and IBS-D, known to Dr. Carlean Purl, who presents to clinic with a complaint of diarrhea.    07/29/2019 patient seen in clinic for diarrhea by Dr. Carlean Purl.  At that time it was thought this is likely more related to IBS instead of her lymphocytic colitis diagnosed in 2019.  At that time recommended pelvic floor therapy but she declined.  She was tapered off of Prednisone and continued on as needed Loperamide.    12/16/2018 GI pathogen panel negative.    Today, the patient describes a chronic history of radiating back-and-forth from diarrhea to constipation.  Tells me though that over the past couple of weeks this has been nonstop with watery stools and a lot of gas.  Tells me that anytime she eats anything she has to immediately go and sometimes does not make it.  Her stomach is constantly "griping".  Tells me really over the past 3 months has been a problem.  She chronically uses Imodium 3 to 4 pills a day but this is not even helping.  Recalls being on other medications in the past that seems to help and wonders if she needs something else.  Does tell me though this episode does not feel any different from when she has had acute issues before.    Denies fever, chills, blood in her stool, weight loss or symptoms that awaken her from sleep.  Past Medical History:  Diagnosis Date   Acute combined systolic and diastolic heart failure ([place]) 05/02/2017   Anemia    Anxiety    Arthritis    "knees" (06/18/2015)   Atrial fibrillation with RVR ([place]) 06/18/2015   h/o sig bleed on coumadin   Cardiogenic shock [id number]   Complication of anesthesia 2010   "w/cardiac cath; got into a psychotic state for ~ 3 days; got me out w/Valium"   Depression    "I have it off and on; not as often as I've gotten older" (06/18/2015)   Diverticulosis    descending  and sigmoid colon--Dr. Sharlett Iles   DJD (degenerative joint disease) of knee    left   Dyspnea    with exertion and in morning mostly when wakes up    E coli infection    History of   GERD (gastroesophageal reflux disease)    History of blood transfusion    History of Clostridium difficile colitis    IBS (irritable bowel syndrome)    Insomnia     off chronic ambien 5mg  as of 10/11, rare/episodic use since then  DCM/CHF   LBBB (left bundle branch block)    Lymphocytic colitis 09/27/2016   Moderate to severe pulmonary hypertension ([place]) 11/26/2019   Nonischemic cardiomyopathy ([place])    normal coronaries, EF 15-20% 04/2008, EF 50-55% 2015   Osteoporosis    on DXA 05/2010   Positive PPD    Retroperitoneal bleed    Sciatica    Right   Urinary incontinence    Occassionally    Past Surgical History:  Procedure Laterality Date   ANTERIOR APPROACH HEMI HIP ARTHROPLASTY Left 08/05/2019   Procedure: ANTERIOR APPROACH HEMI HIP ARTHROPLASTY;  Surgeon: Dorna Leitz, MD;  Location: [facility];  Service: Orthopedics;  Laterality: Left;   BIOPSY  11/22/2017   Procedure: BIOPSY;  Surgeon: Gatha Mayer, MD;  Location: [facility];  Service: Endoscopy;;  CARDIAC CATHETERIZATION  2010   CARDIOVERSION N/A 06/19/2015   Procedure: CARDIOVERSION;  Surgeon: Larey Dresser, MD;  Location: [facility];  Service: Cardiovascular;  Laterality: N/A;   CATARACT EXTRACTION W/ INTRAOCULAR LENS  IMPLANT, BILATERAL Bilateral    COLONOSCOPY WITH PROPOFOL N/A 11/22/2017   Procedure: COLONOSCOPY WITH PROPOFOL;  Surgeon: Gatha Mayer, MD;  Location: [facility];  Service: Endoscopy;  Laterality: N/A;   DILATION AND CURETTAGE OF UTERUS     ORIF ANKLE FRACTURE Right 09/22/2020   Procedure: RIGHT ANKLE OPEN REDUCTION INTERNAL FIXATION (ORIF);  Surgeon: Melrose Nakayama, MD;  Location: [facility];  Service: Orthopedics;  Laterality: Right;   TEE WITHOUT CARDIOVERSION N/A 06/19/2015   Procedure: TRANSESOPHAGEAL ECHOCARDIOGRAM  (TEE);  Surgeon: Larey Dresser, MD;  Location: [facility];  Service: Cardiovascular;  Laterality: N/A;   TOTAL KNEE ARTHROPLASTY Left 04/25/2017   Procedure: TOTAL KNEE ARTHROPLASTY;  Surgeon: Melrose Nakayama, MD;  Location: [facility];  Service: Orthopedics;  Laterality: Left;   TOTAL KNEE ARTHROPLASTY Right 02/18/2020   Procedure: RIGHT TOTAL KNEE ARTHROPLASTY;  Surgeon: Melrose Nakayama, MD;  Location: [facility];  Service: Orthopedics;  Laterality: Right;   TOTAL KNEE ARTHROPLASTY Right 06/16/2020   Procedure: RIGHT RETINACULAR REPAIR;  Surgeon: Melrose Nakayama, MD;  Location: [facility];  Service: Orthopedics;  Laterality: Right;   TOTAL KNEE REVISION Left 12/03/2019   Procedure: LEFT TOTAL KNEE REVISION  OF POYLY EXCHANGE;  Surgeon: Melrose Nakayama, MD;  Location: [facility];  Service: Orthopedics;  Laterality: Left;   VAGINAL HYSTERECTOMY  1979   ovaries intact    Current Outpatient Medications  Medication Sig Dispense Refill   acetaminophen (TYLENOL) 500 MG tablet Take 1,000 mg by mouth in the morning and at bedtime.     calcium carbonate (TUMS - DOSED IN MG ELEMENTAL CALCIUM) 500 MG chewable tablet Chew 500 mg by mouth 2 (two) times daily as needed for indigestion or heartburn.     cephALEXin (KEFLEX) 500 MG capsule Take 1 capsule (500 mg total) by mouth 2 (two) times daily. 14 capsule 0   cholecalciferol (VITAMIN D3) 25 MCG (1000 UNIT) tablet Take 1,000 Units by mouth in the morning.     cholestyramine (QUESTRAN) 4 g packet Take 1 packet (4 g total) by mouth daily with supper. 30 each 2   digoxin (LANOXIN) 0.125 MG tablet TAKE 1/2 TABLET (0.0625 MG) BY MOUTH DAILY. 15 tablet 6   ELIQUIS 2.5 MG TABS tablet TAKE 1 TABLET BY MOUTH TWICE A DAY 180 tablet 1   loperamide (IMODIUM) 2 MG capsule Take 2 mg by mouth 3 (three) times daily as needed for diarrhea or loose stools.      Menthol-Methyl Salicylate (SALONPAS PAIN RELIEF PATCH EX) Apply 2 patches topically daily as needed (pain).     metoprolol  succinate (TOPROL-XL) 100 MG 24 hr tablet Take 1 tablet (100 mg total) by mouth daily. Take with or immediately following a meal. 90 tablet 3   pantoprazole (PROTONIX) 40 MG tablet Take 1 tablet (40 mg total) by mouth daily. 30 tablet 11   Polyethyl Glycol-Propyl Glycol (SYSTANE OP) Place 1-2 drops into the left eye 3 (three) times daily as needed (dryness).     potassium chloride SA (KLOR-CON) 20 MEQ tablet Take 2 tablets (40 mEq total) by mouth daily. 180 tablet 3   sertraline (ZOLOFT) 50 MG tablet Take 2 tablets (100 mg total) by mouth daily. 180 tablet 0   torsemide (DEMADEX) 20 MG tablet Take 1 tablet (20 mg  total) by mouth daily as needed. if 3 pound weight gain overnight or 5 pounds in a week. 30 tablet 5   No current facility-administered medications for this visit.    Allergies as of 03/09/2021 - Review Complete 12/18/2020  Allergen Reaction Noted   Ace inhibitors Cough 03/02/2010   Warfarin sodium Other (See Comments) 07/24/2008   Famotidine Other (See Comments) 12/13/2018   Codeine Other (See Comments) 06/12/2015   Delsym [dextromethorphan polistirex er] Other (See Comments) 12/29/2014   Lactose intolerance (gi) Diarrhea 01/07/2016   Lasix [furosemide] Diarrhea 06/23/2020   Phenylephrine Palpitations and Other (See Comments) 04/14/2017   Sulfamethoxazole-trimethoprim Nausea And Vomiting 06/01/2010    Family History  Problem Relation Age of Onset   Colon cancer Father        possible colon cancer   Other Father        esophagus tumor?   Tuberculosis Mother    Breast cancer Neg Hx    Stomach cancer Neg Hx    Pancreatic cancer Neg Hx     Social History   Socioeconomic History   Marital status: Married    Spouse name: Dorene Sorrow    Number of children: 2   Years of education: 13   Highest education level: Not on file  Occupational History   Occupation: Retired    Associate Professor: RETIRED  Tobacco Use   Smoking status: Never   Smokeless tobacco: Never  Vaping Use   Vaping  Use: Never used  Substance and Sexual Activity   Alcohol use: No    Alcohol/week: 0.0 standard drinks   Drug use: No   Sexual activity: Not Currently    Birth control/protection: Post-menopausal, Surgical    Comment: Hysterectomy  Other Topics Concern   Not on file  Social History Narrative   Retired, former [employer]   Married, 1953   Lives with husband   2 grown children, one in [place], one out of state   Tobacco Use - No.    Drug Use - no   teaches Sunday school, reading   Social Determinants of Corporate investment banker Strain: Low Risk    Difficulty of Paying Living Expenses: Not hard at all  Food Insecurity: No Food Insecurity   Worried About Programme researcher, broadcasting/film/video in the Last Year: Never true   Barista in the Last Year: Never true  Transportation Needs: No Transportation Needs   Lack of Transportation (Medical): No   Lack of Transportation (Non-Medical): No  Physical Activity: Inactive   Days of Exercise per Week: 0 days   Minutes of Exercise per Session: 0 min  Stress: Not on file  Social Connections: Not on file  Intimate Partner Violence: Not At Risk   Fear of Current or Ex-Partner: No   Emotionally Abused: No   Physically Abused: No   Sexually Abused: No    Review of Systems:    Constitutional: No weight loss, fever or chills Cardiovascular: No chest pain  Respiratory: No SOB  Gastrointestinal: See HPI and otherwise negative   Physical Exam:  Vital signs: BP 104/68   Pulse (!) 43   Ht 5\' 5"  (1.651 m)   Wt 145 lb (65.8 kg)   BMI 24.13 kg/m    Constitutional:   Pleasant Elderly Caucasian female appears to be in NAD, Well developed, Well nourished, alert and cooperative Respiratory: Respirations even and unlabored. Lungs clear to auscultation bilaterally.   No wheezes, crackles, or rhonchi.  Cardiovascular: Normal S1,  S2. No MRG. Regular rate and rhythm. No peripheral edema, cyanosis or pallor.  Gastrointestinal:  Soft, nondistended,  nontender. No rebound or guarding.  Increased bowel sounds all 4 quadrants. No appreciable masses or hepatomegaly. Rectal:  Not performed.  Psychiatric: Demonstrates good judgement and reason without abnormal affect or behaviors.  RELEVANT LABS AND IMAGING: CBC    Component Value Date/Time   WBC 8.5 10/15/2020 1324   RBC 4.18 10/15/2020 1324   HGB 13.0 10/15/2020 1324   HGB 12.5 02/05/2018 0937   HCT 39.8 10/15/2020 1324   HCT 38.4 02/05/2018 0937   PLT 274.0 10/15/2020 1324   PLT 320 02/05/2018 0937   MCV 95.4 10/15/2020 1324   MCV 92 02/05/2018 0937   MCH 30.8 09/22/2020 1110   MCHC 32.6 10/15/2020 1324   RDW 15.8 (H) 10/15/2020 1324   RDW 12.3 02/05/2018 0937   LYMPHSABS 1.3 10/15/2020 1324   LYMPHSABS 1.7 02/05/2018 0937   MONOABS 0.7 10/15/2020 1324   EOSABS 0.1 10/15/2020 1324   EOSABS 0.2 02/05/2018 0937   BASOSABS 0.1 10/15/2020 1324   BASOSABS 0.1 02/05/2018 0937    CMP     Component Value Date/Time   NA 135 11/10/2020 1434   K 4.9 11/10/2020 1434   CL 96 11/10/2020 1434   CO2 25 11/10/2020 1434   GLUCOSE 82 11/10/2020 1434   GLUCOSE 97 10/15/2020 1324   BUN 14 11/10/2020 1434   CREATININE 0.72 11/10/2020 1434   CREATININE 0.71 07/10/2015 1301   CALCIUM 9.5 11/10/2020 1434   PROT 5.8 (L) 06/24/2020 0019   PROT 6.2 02/05/2018 0937   ALBUMIN 3.1 (L) 06/24/2020 0019   ALBUMIN 3.9 02/05/2018 0937   AST 27 06/24/2020 0019   ALT 19 06/24/2020 0019   ALKPHOS 73 06/24/2020 0019   BILITOT 1.6 (H) 06/24/2020 0019   BILITOT 0.7 02/05/2018 0937   GFRNONAA >60 09/22/2020 1110   GFRAA 83 05/28/2020 1144    Assessment: 1.  Lymphocytic colitis: Diagnosed in 2019, now in a "flare" with watery frequent stools over the past 3 weeks at least per patient uncontrolled with Imodium, GI pathogen panel - 12/15/2020 2.  IBS-D: Currently I believe her diarrhea is related to above  Plan: 1.  I believe with uncontrollable watery stools this is more likely her lymphocytic  colitis.  Prescribed Budesonide 9 mg x 6 weeks, then taper down to 6 mg x 2 weeks, then 3 mg x 2 weeks and stop.  Patient does tell me that they are not sure how much this medication will cost them but "were not going to pay a lot for it".  Her husband tells me they are in the donut hole currently and may need to get this medication in January when their insurance renews.  Explained that if this is the case they should let me know when she can have some Lomotil to use in the interim. 2.  Patient to follow in clinic as needed.  Ellouise Newer, PA-C [facility] 03/09/2021, 3:27 PM  Cc: [name], MD

## 2021-03-10 ENCOUNTER — Encounter: Payer: Self-pay | Admitting: Cardiovascular Disease

## 2021-03-10 ENCOUNTER — Other Ambulatory Visit: Payer: Self-pay

## 2021-03-10 ENCOUNTER — Ambulatory Visit: Payer: Medicare Other | Admitting: Cardiovascular Disease

## 2021-03-10 VITALS — BP 110/60 | HR 87 | Ht 65.0 in | Wt 146.0 lb

## 2021-03-10 DIAGNOSIS — I2721 Secondary pulmonary arterial hypertension: Secondary | ICD-10-CM

## 2021-03-10 DIAGNOSIS — Z5181 Encounter for therapeutic drug level monitoring: Secondary | ICD-10-CM

## 2021-03-10 DIAGNOSIS — M1711 Unilateral primary osteoarthritis, right knee: Secondary | ICD-10-CM | POA: Diagnosis not present

## 2021-03-10 DIAGNOSIS — I5042 Chronic combined systolic (congestive) and diastolic (congestive) heart failure: Secondary | ICD-10-CM | POA: Diagnosis not present

## 2021-03-10 DIAGNOSIS — Z9181 History of falling: Secondary | ICD-10-CM

## 2021-03-10 DIAGNOSIS — Z7901 Long term (current) use of anticoagulants: Secondary | ICD-10-CM

## 2021-03-10 DIAGNOSIS — R931 Abnormal findings on diagnostic imaging of heart and coronary circulation: Secondary | ICD-10-CM | POA: Diagnosis not present

## 2021-03-10 DIAGNOSIS — S82841D Displaced bimalleolar fracture of right lower leg, subsequent encounter for closed fracture with routine healing: Secondary | ICD-10-CM | POA: Diagnosis not present

## 2021-03-10 DIAGNOSIS — M6281 Muscle weakness (generalized): Secondary | ICD-10-CM | POA: Diagnosis not present

## 2021-03-10 DIAGNOSIS — I4821 Permanent atrial fibrillation: Secondary | ICD-10-CM

## 2021-03-10 DIAGNOSIS — I700 Atherosclerosis of aorta: Secondary | ICD-10-CM | POA: Diagnosis not present

## 2021-03-10 DIAGNOSIS — M810 Age-related osteoporosis without current pathological fracture: Secondary | ICD-10-CM | POA: Diagnosis not present

## 2021-03-10 DIAGNOSIS — I071 Rheumatic tricuspid insufficiency: Secondary | ICD-10-CM

## 2021-03-10 DIAGNOSIS — I447 Left bundle-branch block, unspecified: Secondary | ICD-10-CM | POA: Diagnosis not present

## 2021-03-10 NOTE — Patient Instructions (Signed)
Medication Instructions:  No changes *If you need a refill on your cardiac medications before your next appointment, please call your pharmacy*   Lab Work: Your provider would like for you to have the following labs at your next PCP visit: Digoxin   If you have labs (blood work) drawn today and your tests are completely normal, you will receive your results only by: MyChart Message (if you have MyChart) OR A paper copy in the mail If you have any lab test that is abnormal or we need to change your treatment, we will call you to review the results.   Testing/Procedures: None ordered   Follow-Up: At [facility], you and your health needs are our priority.  As part of our continuing mission to provide you with exceptional heart care, we have created designated Provider Care Teams.  These Care Teams include your primary Cardiologist (physician) and Advanced Practice Providers (APPs -  Physician Assistants and Nurse Practitioners) who all work together to provide you with the care you need, when you need it.  We recommend signing up for the patient portal called "MyChart".  Sign up information is provided on this After Visit Summary.  MyChart is used to connect with patients for Virtual Visits (Telemedicine).  Patients are able to view lab/test results, encounter notes, upcoming appointments, etc.  Non-urgent messages can be sent to your provider as well.   To learn more about what you can do with MyChart, go to [email or web address].    Your next appointment:   6 month(s)  The format for your next appointment:   In Person  Provider:   Thurmon Fair, MD

## 2021-03-10 NOTE — Progress Notes (Addendum)
Cardiology Office Note:    Date:  03/14/2021   ID:  [name], DOB Apr 12, 1930, MRN [id number]  PCP:  [name], MD   [facility] Providers Cardiologist:  Sanda Klein, MD     Referring MD: [name], MD   Chief Complaint  Patient presents with   Congestive Heart Failure        Atrial Fibrillation     History of Present Illness:    [name] is a 87 y.o. female with a history of longstanding persistent atrial fibrillation, chronic systolic heart failure since 2019 (EF dropped to 15% in setting of A. fib with RVR during hospital stay for hip fracture) with incomplete recovery (EF 35% in 2021, 25-30% by most recent echo in 06/24/2020), pulmonary artery hypertension (estimated systolic PAP 83 mm by echo in April 2021, with severe tricuspid regurgitation and abnormal ventricular septal flattening improved to systolic PAP 38 mmHg by echo with moderate TR in 06/24/2020).  She has severe biatrial dilation and chronic LBBB.  She has a history of spontaneous retroperitoneal bleed on warfarin many years ago.  She has a history of microscopic colitis.  She was hospitalized in March 2022 for an orthopedic surgery and shortly after returning home she developed progressive dyspnea on exertion, orthopnea and PND and was found to have a weight roughly 10 pounds above her previous "dry weight".  She was treated with diuretics.  Echo performed during that admission showed an EF of about 25-30 %.  Wilder Glade was initiated, but she stopped it because it made her feel extremely weak.  Her legs could barely hold her.  She has stopped the [place] and feels improved.  Her weight has been very steady at home consistently 145-186 pounds on her home scale, which matches our office scale.  She only takes torsemide once every 2 or 3 weeks and has not had problems with severe edema.  She is on relatively high-dose metoprolol succinate 100 mg daily and digoxin (half of a 0.125 mg tablet daily),  both needed for ventricular rate control, but is not on RAAS inhibitors.  She has a history of cough with ACE inhibitors.  Losartan 25 mg was also started during her hospitalization in March, but has since been discontinued due to fatigue and low blood pressure.  Her blood pressure is unlikely to tolerate Entresto.  At one point, amiodarone was used for rate control but this was also stopped due to side effects  Currently NYHA functional class I.  I do not think she has ever had a cardiac catheterization.  The nuclear stress test in 2019 when she presented with atrial fibrillation rapid ventricular response and severe reduction in LVEF showed a "potential subtle apical, distal septal and distal anteroseptal ischemia" with abnormalities on both rest and stress studies and septal dyskinesia with apex "nearly akinetic".  EF was 23%.  On my review, all these findings are readily explained by LBBB related artifact, especially in a patient with rapid rates.  Past Medical History:  Diagnosis Date   Acute combined systolic and diastolic heart failure ([place]) 05/02/2017   Anemia    Anxiety    Arthritis    "knees" (06/18/2015)   Atrial fibrillation with RVR ([place]) 06/18/2015   h/o sig bleed on coumadin   Cardiogenic shock [id number]   Complication of anesthesia 2010   "w/cardiac cath; got into a psychotic state for ~ 3 days; got me out w/Valium"   Depression    "I have it  off and on; not as often as I've gotten older" (06/18/2015)   Diverticulosis    descending and sigmoid colon--Dr. Sharlett Iles   DJD (degenerative joint disease) of knee    left   Dyspnea    with exertion and in morning mostly when wakes up    E coli infection    History of   GERD (gastroesophageal reflux disease)    History of blood transfusion    History of Clostridium difficile colitis    IBS (irritable bowel syndrome)    Insomnia     off chronic ambien 5mg  as of 10/11, rare/episodic use since then  DCM/CHF   LBBB (left bundle  branch block)    Lymphocytic colitis 09/27/2016   Moderate to severe pulmonary hypertension ([place]) 11/26/2019   Nonischemic cardiomyopathy ([place])    normal coronaries, EF 15-20% 04/2008, EF 50-55% 2015   Osteoporosis    on DXA 05/2010   Positive PPD    Retroperitoneal bleed    Sciatica    Right   Urinary incontinence    Occassionally    Past Surgical History:  Procedure Laterality Date   ANTERIOR APPROACH HEMI HIP ARTHROPLASTY Left 08/05/2019   Procedure: ANTERIOR APPROACH HEMI HIP ARTHROPLASTY;  Surgeon: Dorna Leitz, MD;  Location: [place];  Service: Orthopedics;  Laterality: Left;   BIOPSY  11/22/2017   Procedure: BIOPSY;  Surgeon: Gatha Mayer, MD;  Location: [facility];  Service: Endoscopy;;   CARDIAC CATHETERIZATION  2010   CARDIOVERSION N/A 06/19/2015   Procedure: CARDIOVERSION;  Surgeon: Larey Dresser, MD;  Location: [place];  Service: Cardiovascular;  Laterality: N/A;   CATARACT EXTRACTION W/ INTRAOCULAR LENS  IMPLANT, BILATERAL Bilateral    COLONOSCOPY WITH PROPOFOL N/A 11/22/2017   Procedure: COLONOSCOPY WITH PROPOFOL;  Surgeon: Gatha Mayer, MD;  [facility];  Service: Endoscopy;  Laterality: N/A;   DILATION AND CURETTAGE OF UTERUS     ORIF ANKLE FRACTURE Right 09/22/2020   Procedure: RIGHT ANKLE OPEN REDUCTION INTERNAL FIXATION (ORIF);  Surgeon: Melrose Nakayama, MD;  Location: [facility];  Service: Orthopedics;  Laterality: Right;   TEE WITHOUT CARDIOVERSION N/A 06/19/2015   Procedure: TRANSESOPHAGEAL ECHOCARDIOGRAM (TEE);  Surgeon: Larey Dresser, MD;  Location: [place];  Service: Cardiovascular;  Laterality: N/A;   TOTAL KNEE ARTHROPLASTY Left 04/25/2017   Procedure: TOTAL KNEE ARTHROPLASTY;  Surgeon: Melrose Nakayama, MD;  Location: [place];  Service: Orthopedics;  Laterality: Left;   TOTAL KNEE ARTHROPLASTY Right 02/18/2020   Procedure: RIGHT TOTAL KNEE ARTHROPLASTY;  Surgeon: Melrose Nakayama, MD;  [facility];  Service: Orthopedics;  Laterality:  Right;   TOTAL KNEE ARTHROPLASTY Right 06/16/2020   Procedure: RIGHT RETINACULAR REPAIR;  Surgeon: Melrose Nakayama, MD;  [facility];  Service: Orthopedics;  Laterality: Right;   TOTAL KNEE REVISION Left 12/03/2019   Procedure: LEFT TOTAL KNEE REVISION  OF POYLY EXCHANGE;  Surgeon: Melrose Nakayama, MD;  Location: [facility];  Service: Orthopedics;  Laterality: Left;   VAGINAL HYSTERECTOMY  1979   ovaries intact    Current Medications: Current Meds  Medication Sig   acetaminophen (TYLENOL) 500 MG tablet Take 1,000 mg by mouth in the morning and at bedtime.   budesonide (ENTOCORT EC) 3 MG 24 hr capsule Take 3 capsules (9 mg total) by mouth daily for 42 days, THEN 2 capsules (6 mg total) daily for 14 days, THEN 1 capsule (3 mg total) daily for 14 days.   calcium carbonate (TUMS - DOSED IN MG ELEMENTAL CALCIUM) 500 MG  chewable tablet Chew 500 mg by mouth 2 (two) times daily as needed for indigestion or heartburn.   cholecalciferol (VITAMIN D3) 25 MCG (1000 UNIT) tablet Take 1,000 Units by mouth in the morning.   cholestyramine (QUESTRAN) 4 g packet Take 1 packet (4 g total) by mouth daily with supper.   digoxin (LANOXIN) 0.125 MG tablet TAKE 1/2 TABLET (0.0625 MG) BY MOUTH DAILY.   ELIQUIS 2.5 MG TABS tablet TAKE 1 TABLET BY MOUTH TWICE A DAY   loperamide (IMODIUM) 2 MG capsule Take 2 mg by mouth 3 (three) times daily as needed for diarrhea or loose stools.    Menthol-Methyl Salicylate (SALONPAS PAIN RELIEF PATCH EX) Apply 2 patches topically daily as needed (pain).   metoprolol succinate (TOPROL-XL) 100 MG 24 hr tablet Take 1 tablet (100 mg total) by mouth daily. Take with or immediately following a meal.   pantoprazole (PROTONIX) 40 MG tablet Take 1 tablet (40 mg total) by mouth daily.   Polyethyl Glycol-Propyl Glycol (SYSTANE OP) Place 1-2 drops into the left eye 3 (three) times daily as needed (dryness).   potassium chloride SA (KLOR-CON) 20 MEQ tablet Take 2 tablets (40 mEq total) by mouth  daily.   sertraline (ZOLOFT) 50 MG tablet Take 2 tablets (100 mg total) by mouth daily.   torsemide (DEMADEX) 20 MG tablet Take 1 tablet (20 mg total) by mouth daily as needed. if 3 pound weight gain overnight or 5 pounds in a week.     Allergies:   Ace inhibitors, Warfarin sodium, Famotidine, Codeine, Delsym [dextromethorphan polistirex er], Lactose intolerance (gi), Lasix [furosemide], Phenylephrine, and Sulfamethoxazole-trimethoprim   Social History   Socioeconomic History   Marital status: Married    Spouse name: Dorene Sorrow    Number of children: 2   Years of education: 13   Highest education level: Not on file  Occupational History   Occupation: Retired    Associate Professor: RETIRED  Tobacco Use   Smoking status: Never   Smokeless tobacco: Never  Vaping Use   Vaping Use: Never used  Substance and Sexual Activity   Alcohol use: No    Alcohol/week: 0.0 standard drinks   Drug use: No   Sexual activity: Not Currently    Birth control/protection: Post-menopausal, Surgical    Comment: Hysterectomy  Other Topics Concern   Not on file  Social History Narrative   Retired, former [employer]   Married, 1953   Lives with husband   2 grown children, one in [place], one out of state   Tobacco Use - No.    Drug Use - no   teaches Sunday school, reading   Social Determinants of Corporate investment banker Strain: Low Risk    Difficulty of Paying Living Expenses: Not hard at all  Food Insecurity: No Food Insecurity   Worried About Programme researcher, broadcasting/film/video in the Last Year: Never true   Barista in the Last Year: Never true  Transportation Needs: No Transportation Needs   Lack of Transportation (Medical): No   Lack of Transportation (Non-Medical): No  Physical Activity: Inactive   Days of Exercise per Week: 0 days   Minutes of Exercise per Session: 0 min  Stress: Not on file  Social Connections: Not on file     Family History: The patient's family history includes Colon  cancer in her father; Other in her father; Tuberculosis in her mother. There is no history of Breast cancer, Stomach cancer, or Pancreatic cancer.  ROS:  Please see the history of present illness.     All other systems reviewed and are negative.  EKGs/Labs/Other Studies Reviewed:    The following studies were reviewed today: Cardiac MRI 2014  1) Normal LV size and function EF 57%   2) No hyperenhancement or scar tissue   3) Mild LAE   4) Trivial MR    Nuclear stress test 05/04/2017 IMPRESSION: 1. Potential subtle ischemia involving the left ventricular apex, distal septum and distal anteroseptal wall. Mildly diminished perfusion on both stress and rest studies of the distal anterior wall may be related to scar or breast soft tissue attenuation. 2. Severe wall motion abnormalities with septal dyskinesis, apical akinesis and otherwise global hypokinesis. The left ventricular cavity is significantly dilated. 3. Left ventricular ejection fraction: 23%.  4. Non invasive risk stratification*: High  Echo 08/05/19 1. Left ventricular ejection fraction, by estimation, is 35%. The left  ventricle has moderate to severely decreased function. The left ventricle  demonstrates global hypokinesis. There is moderate left ventricular  hypertrophy. Left ventricular diastolic  parameters are indeterminate. There is the interventricular septum is  flattened in diastole ('D' shaped left ventricle), consistent with right  ventricular volume overload.   2. Right ventricular systolic function is moderately reduced. The right  ventricular size is moderately enlarged. There is severely elevated  pulmonary artery systolic pressure. The estimated right ventricular  systolic pressure is [id number] mmHg.   3. Left atrial size was mildly dilated.   4. Right atrial size was severely dilated.   5. The mitral valve is normal in structure. No evidence of mitral valve  regurgitation. No evidence of mitral stenosis.    6. The tricuspid valve is degenerative. Tricuspid valve regurgitation is  severe.   7. The aortic valve is grossly normal. Aortic valve regurgitation is not  visualized. No aortic stenosis is present.   8. The inferior vena cava is dilated in size with <50% respiratory  variability, suggesting right atrial pressure of 15 mmHg.   Echo 06/24/2020  1. Left ventricular ejection fraction, by estimation, is 25 to 30%. Left  ventricular ejection fraction by 3D volume is 30 %. Left ventricular  ejection fraction by 2D MOD biplane is 26.8 %. The left ventricle has  severely decreased function. The left  ventricle demonstrates global hypokinesis. Left ventricular diastolic  function could not be evaluated. The average left ventricular global  longitudinal strain is -7.9 %. The global longitudinal strain is abnormal.   2. Right ventricular systolic function is mildly reduced. The right  ventricular size is mildly enlarged. There is mildly elevated pulmonary  artery systolic pressure. The estimated right ventricular systolic  pressure is 0000000 mmHg.   3. Left atrial size was severely dilated.   4. Right atrial size was severely dilated.   5. The mitral valve is grossly normal. Mild mitral valve regurgitation.  No evidence of mitral stenosis.   6. Tricuspid valve regurgitation is moderate.   7. The aortic valve is tricuspid. There is mild calcification of the  aortic valve. There is mild thickening of the aortic valve. Aortic valve  regurgitation is mild. Mild aortic valve sclerosis is present, with no  evidence of aortic valve stenosis.   8. The inferior vena cava is normal in size with greater than 50%  respiratory variability, suggesting right atrial pressure of 3 mmHg.   Comparison(s): Changes from prior study are noted. LVEF now 25-30%. RV  size and function have improved. RVSP improved from prior.   EKG:  EKG is  ordered today.  The ekg ordered today demonstrates atrial fibrillation, left  bundle branch block left axis deviation, unchanged from previous tracings.  QRS 144 ms, QTC 507 ms  Recent Labs: 06/23/2020: B Natriuretic Peptide 3,972.5 06/24/2020: ALT 19; TSH 4.356 10/15/2020: Hemoglobin 13.0; Platelets 274.0 11/10/2020: BUN 14; Creatinine, Ser 0.72; Potassium 4.9; Sodium 135  Recent Lipid Panel    Component Value Date/Time   CHOL 261 (H) 12/27/2018 1657   CHOL 156 02/05/2018 0937   TRIG (H) 12/27/2018 1657    424.0 Triglyceride is over 400; calculations on Lipids are invalid.   HDL 32.20 (L) 12/27/2018 1657   HDL 58 02/05/2018 0937   CHOLHDL 8 12/27/2018 1657   VLDL 30.2 01/30/2017 1451   LDLCALC 73 02/05/2018 0937   LDLDIRECT 58.0 12/27/2018 1657     Risk Assessment/Calculations:    CHA2DS2-VASc Score = 6   This indicates a 9.7% annual risk of stroke. The patient's score is based upon: CHF History: 1 HTN History: 1 Diabetes History: 0 Stroke History: 0 Vascular Disease History: 1 Age Score: 2 Gender Score: 1          Physical Exam:    VS:  BP 110/60   Pulse 87   Ht 5\' 5"  (1.651 m)   Wt 146 lb (66.2 kg)   SpO2 96%   BMI 24.30 kg/m     Wt Readings from Last 3 Encounters:  03/10/21 146 lb (66.2 kg)  03/09/21 145 lb (65.8 kg)  12/18/20 148 lb (67.1 kg)     GEN: Appears well, well nourished, well developed in no acute distress HEENT: Normal NECK: No JVD; No carotid bruits LYMPHATICS: No lymphadenopathy CARDIAC: Irregular, no murmurs, rubs, gallops RESPIRATORY:  Clear to auscultation without rales, wheezing or rhonchi  ABDOMEN: Soft, non-tender, non-distended MUSCULOSKELETAL:  No edema; No deformity  SKIN: Warm and dry NEUROLOGIC:  Alert and oriented x 3 PSYCHIATRIC:  Normal affect   ASSESSMENT:    1. Chronic combined systolic and diastolic heart failure ([place])   2. Permanent atrial fibrillation ([place])   3. Long term (current) use of anticoagulants   4. At risk for falling   5. Severe tricuspid regurgitation   6. PAH (pulmonary  artery hypertension) ([place])   7. LBBB (left bundle branch block)   8. Abnormal nuclear cardiac imaging test   9. Aortic atherosclerosis ([place])   10. Therapeutic drug monitoring    PLAN:    In order of problems listed above:  CHF: Currently NYHA functional class I, clinically euvolemic with only intermittent dosing of loop diuretic.  I suspect her decompensations may have been largely related to rapid ventricular rates and that if we were to repeat an echocardiogram we would hopefully see improvement in LV systolic function.  Notes that she had low normal or mildly reduced LVEF even before the events in 2019, with reduction in EF at least in part due to LBBB related dyssynchrony.  She has been intolerant of RAAS inhibitors (ACE inhibitor's cough, losartan hypotension and fatigue) and is unlikely to tolerate Entresto or spironolactone.  Note that pro BNP levels were normal in 2010-2013, but have been elevated since 2019.  There was a sharp increase in BNP level from a mildly abnormal value of 284 in 2017 to 3961 in 2019 and 3972 in March 2022. AFib: Adequately rate controlled on relatively high-dose beta-blocker as well as digoxin.  In view of her age, important to check a digoxin level.  This should be checked before  she takes her daily dose of medication.  She has plans for additional labs to be checked in a couple of weeks with Dr. Damita Dunnings and will have the digoxin level checked at that time. Anticoagulation: Unfortunately she has had at least 1 more fall since her last hospitalization.  Thankfully without head impact or serious injury.  Embolic risk is high, but at some point we may reach a point where the risk of anticoagulation exceeds that.  For the time being continue Eliquis (low dose adjusted for age and weight). Falls: With previous serious injury in the past, including hip fracture, advised caution, use of assistive devices, regular exercises in safe conditions with physical therapy. TR/PAH:  Severity of TR has fluctuated in parallel with the severity of pulmonary artery hypertension, which probably reflected the degree of decompensation of left heart failure.  The murmur is quite unremarkable today.  She has normal jugular venous pulsations. LBBB: Option for CRT pacing.  She seems very much disinclined to undergo any surgical procedures. Abnormal nuclear myocardial perfusion study: On my review the abnormalities are not readily explained by LBBB related artifact.  Cannot 100% exclude coronary disease. Aortic atherosclerosis: Reported on remote CT of the abdomen and pelvis, not surprising at her age.  No aneurysm formation.  Most recent LDL cholesterol was pretty good at 58 although her HDL is low at 32.  Triglycerides were high at 424, but I wonder whether that was a truly fasting specimen since she does not have significant hypertriglyceridemia on older assays.  Continue current management.           Medication Adjustments/Labs and Tests Ordered: Current medicines are reviewed at length with the patient today.  Concerns regarding medicines are outlined above.  Orders Placed This Encounter  Procedures   Digoxin level   EKG 12-Lead   No orders of the defined types were placed in this encounter.   Patient Instructions  Medication Instructions:  No changes *If you need a refill on your cardiac medications before your next appointment, please call your pharmacy*   Lab Work: Your provider would like for you to have the following labs at your next PCP visit: Digoxin   If you have labs (blood work) drawn today and your tests are completely normal, you will receive your results only by: [place] (if you have MyChart) OR A paper copy in the mail If you have any lab test that is abnormal or we need to change your treatment, we will call you to review the results.   Testing/Procedures: None ordered   Follow-Up: At [facility], you and your health needs are our  priority.  As part of our continuing mission to provide you with exceptional heart care, we have created designated Provider Care Teams.  These Care Teams include your primary Cardiologist (physician) and Advanced Practice Providers (APPs -  Physician Assistants and Nurse Practitioners) who all work together to provide you with the care you need, when you need it.  We recommend signing up for the patient portal called "MyChart".  Sign up information is provided on this After Visit Summary.  MyChart is used to connect with patients for Virtual Visits (Telemedicine).  Patients are able to view lab/test results, encounter notes, upcoming appointments, etc.  Non-urgent messages can be sent to your provider as well.   To learn more about what you can do with MyChart, go to [email or web address].    Your next appointment:   6 month(s)  The format for your next  appointment:   In Person  Provider:   Sanda Klein, MD   Signed, Sanda Klein, MD  03/14/2021 5:22 PM    [place]

## 2021-03-11 ENCOUNTER — Encounter: Payer: Self-pay | Admitting: Family Medicine

## 2021-03-15 ENCOUNTER — Other Ambulatory Visit: Payer: Self-pay | Admitting: Cardiovascular Disease

## 2021-03-15 ENCOUNTER — Other Ambulatory Visit: Payer: Self-pay | Admitting: Internal Medicine

## 2021-03-16 ENCOUNTER — Encounter: Payer: Self-pay | Admitting: Cardiovascular Disease

## 2021-03-18 ENCOUNTER — Telehealth: Payer: Self-pay

## 2021-03-18 NOTE — Telephone Encounter (Signed)
Patient was seen by Dr Duke Salvia 12/18/2020

## 2021-03-18 NOTE — Progress Notes (Signed)
    Chronic Care Management Pharmacy Assistant   Name: [name]  MRN: [id number] DOB: 1931/01/18  Reason for Encounter: CCM [name] Patient Assistance)   Per patient request I called [place] [employer] and requested that patient be removed from [place] patient assistance. Patient stated she has tried several times through phone call and letter, but they keep sending it to her. Spoke with [facility] who cancelled her prescription. I called patient to inform her.   Al Corpus, CPP notified  [name], [place] Clinical Pharmacy Assistant [phone]  Time Spent:  10 Minutes

## 2021-03-18 NOTE — Progress Notes (Signed)
Chronic Care Management Pharmacy Assistant   Name: [name]  MRN: [id number] DOB: [birth year]  Reason for Encounter: CCM (Initial Questions)   Recent office visits:  01/19/2021 - Crawford Givens, MD - Patient Message - Start: cephALEXin (KEFLEX) 500 MG capsule Take 1 capsule (500 mg total) by mouth 2 (two) times daily due to dysuria.  10/15/2020 - Crawford Givens, MD - Patient presented for fatigue. Labs: CBC and BMP. Stop due to patient not taking: methocarbamol (ROBAXIN) 500 MG tablet, predniSONE (DELTASONE) 5 MG tablet and traMADol (ULTRAM) 50 MG tablet.   Recent consult visits:  03/10/2021 - Thurmon Fair, MD - Cardiology - Patient presented for congestive heart failure. Labs: Digoxin level and EKG. No medication changes.  03/09/2021 - Hyacinth Meeker, PA - Gastroenterology - Patient presented for diarrhea. Start: Budesonide 9 mg x 6 weeks, then taper down to 6 mg x 2 weeks, then 3 mg x 2 weeks and stop due to diarrhea due to Lymphocytic Colitis.  12/15/2020 - [name], MD - Cardiology - Patient presented for left bundle branch block. Start: pantoprazole (PROTONIX) 40 MG tablet due to GERD, cough and hoarseness. Stop due to provider preference (no reason given) losartan (COZAAR) 25 MG tablet. 12/12/2020 Gillian Shields, NP - Cardiology - Patient Message - Change: Losartan from 25 mg daily to 12.5 mg daily due to Hypotension. Stop due to patient reporting not taking: Farxiga 5 mg due to side effects of dry mouth and dry eyes.  11/12/2020 - Regis Bill, LPN - Cardiology - Patient Message - Stop: Klor Con 20 meq packets due to cost. Start: potassium chloride SA (KLOR-CON) 20 MEQ tablet - Take 2 tablets (40 mEq total) by mouth daily. 11/10/2020 Anabel Halon, LPN - Cardiology - Patient Message - Start: Klor Con 20 meq packets. Dissolve 1 packet in cold water or beverage twice daily after meals. Patient requested another form of potassium as pills are too hard to take.  10/22/2020  - No provider information noted - Patient presented for age-related osteoporosis. No other information.  10/14/2020 - No provider information noted - Patient presented for age-related osteoporosis. No other information.  10/08/2020 - Charlott Rakes - Family Medicine - Patient presented for difficulty in walking, unspecified atrial fibrillation, chronic combined systolic and diastolic heart failure and displaced bimalleolar fracture of right lower leg. No other information.  10/07/2020 - [name] - Orthopedic Surgery - Patient presented for pain in right shoulder. Procedures: PR BETAMETHASONE ACET&SOD PHOSP, CHG X-RAY ANKLE 3+ VW and PR DRAIN/INJECT LARGE JOINT/BURSA. No other information.  10/01/2020 - Abner Greenspan - Family Medicine - Patient presented for difficulty in walking. No other information.  09/21/2020 - Regis Bill, LPN - Patient Message - Advised to take Torsemide daily for 3 days only them resume PRN for weight gain.   Hospital visits:  Medication Reconciliation was completed by comparing discharge summary, patient's EMR and Pharmacy list, and upon discussion with patient.  Admitted to the hospital on 09/22/2020 due to closed right ankle fracture. Discharge date was 09/24/2020. Discharged from [facility].    New?Medications Started at [facility] Discharge:?? -started methocarbamol (ROBAXIN) 500 MG tablet - Take 1 tablet (500 mg total) by mouth every 6 (six) hours as needed for muscle spasms.,  -started torsemide (DEMADEX) 20 MG tablet - Take 1 tablet (50 mg total) by mouth every 6 (six) hours as needed for moderate pain or severe pain (post op pain).,  Medication Changes at Hospital Discharge: None noted  Medications  Discontinued at Hospital Discharge: None noted  Medications that remain the same after Hospital Discharge:??  -All other medications will remain the same.    Medications: Outpatient Encounter Medications as of 03/18/2021  Medication Sig    acetaminophen (TYLENOL) 500 MG tablet Take 1,000 mg by mouth in the morning and at bedtime.   budesonide (ENTOCORT EC) 3 MG 24 hr capsule Take 3 capsules (9 mg total) by mouth daily for 42 days, THEN 2 capsules (6 mg total) daily for 14 days, THEN 1 capsule (3 mg total) daily for 14 days.   calcium carbonate (TUMS - DOSED IN MG ELEMENTAL CALCIUM) 500 MG chewable tablet Chew 500 mg by mouth 2 (two) times daily as needed for indigestion or heartburn.   cholecalciferol (VITAMIN D3) 25 MCG (1000 UNIT) tablet Take 1,000 Units by mouth in the morning.   cholestyramine (QUESTRAN) 4 g packet TAKE 1 PACKET (4 G TOTAL) BY MOUTH DAILY WITH SUPPER.   digoxin (LANOXIN) 0.125 MG tablet TAKE 1 TABLET BY MOUTH EVERY DAY   ELIQUIS 2.5 MG TABS tablet TAKE 1 TABLET BY MOUTH TWICE A DAY   loperamide (IMODIUM) 2 MG capsule Take 2 mg by mouth 3 (three) times daily as needed for diarrhea or loose stools.    Menthol-Methyl Salicylate (SALONPAS PAIN RELIEF PATCH EX) Apply 2 patches topically daily as needed (pain).   metoprolol succinate (TOPROL-XL) 100 MG 24 hr tablet Take 1 tablet (100 mg total) by mouth daily. Take with or immediately following a meal.   pantoprazole (PROTONIX) 40 MG tablet Take 1 tablet (40 mg total) by mouth daily.   Polyethyl Glycol-Propyl Glycol (SYSTANE OP) Place 1-2 drops into the left eye 3 (three) times daily as needed (dryness).   potassium chloride SA (KLOR-CON) 20 MEQ tablet Take 2 tablets (40 mEq total) by mouth daily.   sertraline (ZOLOFT) 50 MG tablet Take 2 tablets (100 mg total) by mouth daily.   torsemide (DEMADEX) 20 MG tablet Take 1 tablet (20 mg total) by mouth daily as needed. if 3 pound weight gain overnight or 5 pounds in a week.   No facility-administered encounter medications on file as of 03/18/2021.   Lab Results  Component Value Date/Time   HGBA1C 5.4 09/22/2020 11:10 AM   HGBA1C 5.4 06/24/2020 12:19 AM    BP Readings from Last 3 Encounters:  03/10/21 110/60  03/09/21  104/68  12/18/20 90/78   Patient contacted to review initial questions prior to visit with Al Corpus.  Have you seen any other providers since your last visit with PCP? Yes   Any changes in your medications or health? Yes - Patient reported that she stopped Protonix due to cough.   Any side effects from any medications? Yes - Patient reported that she stopped Protonix due to cough.   Do you have an symptoms or problems not managed by your medications? No  Any concerns about your health right now? Yes - Patient states that she is tired all the time.  Patient also feel that her knees and hip not fully rehabilitated as they should be. Patient is still having depression despite being on medication which interferes with her daily life. Feels the medication is not helping as much as she feels it should. Patient states she is sleeping a lot due to depression. Patient is not sleeping real well at night some nights - she will sleep an extra 2-3 hours during the day. Patient is going to the bathroom at night sometimes 5 times a  night which makes it hard to sleep. About every 1.5-2 hours she has to get up. Goes to bed around 10:15 and the urinating will start around 12:00 am.   Has your provider asked that you check blood pressure, blood sugar, or follow special diet at home? Yes - Patient stated she has not been checking. I asked patient to take her blood pressure and record it starting today until the phone call with Charlene Brooke. Advised patient that Mendel Ryder will ask for her log. Patient agreed.   Do you get any type of exercise on a regular basis? Yes - Patient sits in her chair and uses a wheel machine with feet.   Can you think of a goal you would like to reach for your health? Yes - Would like to get off her walker and get back to using a cane.   Do you have any problems getting your medications? Yes - Not a problem getting the medication, but patient can not get her Wilder Glade to stop  coming to her house. Patient was on patient assistance for it before stopping. Patient has called and written a letter and the manufacturer won't stop sending it. I advised patient that I would call and cancel her assistance.   Is there anything that you would like to discuss during the appointment? Yes (Please see "Any concerns about your health right now").   Spoke with patient and reminded them to have all medications, supplements and any blood glucose and blood pressure readings available for review with pharmacist, at their telephone visit on 03/24/2021 at 9:00 am.   Star Rating Drugs:  Medication:   Last Fill: Day Supply Eliquis 2.5 mg   12/28/2020 90 Digoxin 125 MCG  12/12/2020 90 Metoprolol 100 mg  02/09/2021 90 Pantoprazole 40 mg  12/18/2020 90 Potassium Chloride 20 MEQ 11/11/2020 90 Sertraline HCL 50 mg  01/28/2021 90 No Star Rating Drugs Noted  Care Gaps: Annual wellness visit in last year? No Most Recent BP reading: 110/60 on 03/10/2021  Charlene Brooke, CPP notified  Marijean Niemann, [place] Clinical Pharmacy Assistant [phone]  Time Spent: 13 Minutes

## 2021-03-24 ENCOUNTER — Ambulatory Visit (INDEPENDENT_AMBULATORY_CARE_PROVIDER_SITE_OTHER): Payer: Medicare Other | Admitting: Pharmacist

## 2021-03-24 ENCOUNTER — Other Ambulatory Visit: Payer: Self-pay

## 2021-03-24 ENCOUNTER — Telehealth: Payer: Self-pay | Admitting: Pharmacist

## 2021-03-24 DIAGNOSIS — K580 Irritable bowel syndrome with diarrhea: Secondary | ICD-10-CM

## 2021-03-24 DIAGNOSIS — I4811 Longstanding persistent atrial fibrillation: Secondary | ICD-10-CM

## 2021-03-24 DIAGNOSIS — F339 Major depressive disorder, recurrent, unspecified: Secondary | ICD-10-CM

## 2021-03-24 DIAGNOSIS — R32 Unspecified urinary incontinence: Secondary | ICD-10-CM

## 2021-03-24 DIAGNOSIS — I5042 Chronic combined systolic (congestive) and diastolic (congestive) heart failure: Secondary | ICD-10-CM

## 2021-03-24 DIAGNOSIS — M8080XS Other osteoporosis with current pathological fracture, unspecified site, sequela: Secondary | ICD-10-CM

## 2021-03-24 NOTE — Patient Instructions (Signed)
Visit Information  Phone number for Pharmacist: [phone]  Thank you for meeting with me to discuss your medications! I look forward to working with you to achieve your health care goals. Below is a summary of what we talked about during the visit:   Goals Addressed             This Visit's Progress    Manage My Medicine       Timeframe:  Long-Range Goal Priority:  High Start Date:          03/24/21                   Expected End Date:        03/24/22               Follow Up Date Jan 2023   - call for medicine refill 2 or 3 days before it runs out - call if I am sick and can't take my medicine - keep a list of all the medicines I take; vitamins and herbals too - use a pillbox to sort medicine  -Try famotidine (Pepcid) 20 mg with dinner for reflux -Contact GI about worsening diarrhea -Make PCP appt for January 2023 - updated labwork needed   Why is this important?   These steps will help you keep on track with your medicines.   Notes:         Care Plan : CCM Pharmacy Care Plan  Updates made by Kathyrn Sheriff, RPH since 03/24/2021 12:00 AM     Problem: Hypertension, Hyperlipidemia, Atrial Fibrillation, Heart Failure, Coronary Artery Disease, GERD, Depression, Osteoporosis, Osteoarthritis, and Overactive Bladder   Priority: High     Long-Range Goal: Disease mgmt   Start Date: 03/24/2021  Expected End Date: 03/24/2022  This Visit's Progress: On track  Priority: High  Note:   Current Barriers:  Unable to independently monitor therapeutic efficacy Unable to achieve control of OAB, IBS-D   Pharmacist Clinical Goal(s):  Patient will achieve adherence to monitoring guidelines and medication adherence to achieve therapeutic efficacy achieve improvement in OAB, IBS-D as evidenced by patient report through collaboration with PharmD and provider.   Interventions: 1:1 collaboration with [name], MD regarding development and update of comprehensive plan  of care as evidenced by provider attestation and co-signature Inter-disciplinary care team collaboration (see longitudinal plan of care) Comprehensive medication review performed; medication list updated in electronic medical record  Hyperlipidemia: (LDL goal < 70) -Controlled - LDL was at goal (58) but last checked 12/2018; TRIG elevated at that time but likely not fasting given late afternoon lab time (16:57) -Hx MI. Noted statin intolerance in chart. -Current treatment: Cholestyramine 4 g daily PM (for diarrhea) -Educated on Cholesterol goals;  -Recommend repeat lipid panel at next PCP visit  Persistent Atrial Fibrillation (Goal: prevent stroke and major bleeding) -Controlled - pt endorses compliance with medications and denies issues; digoxin level ordered by cardiologist for next PCP visit - needs to be prior to daily dose of digoxin. -CHADSVASC: 6; chronic LBBB; hx of falls -Current treatment: Metoprolol succinate 100 mg daily Digoxin 0.125 mg - 1/2 tab daily Eliquis 2.5 mg BID -Medications previously tried: amiodarone -Counseled on increased risk of stroke due to Afib and benefits of anticoagulation for stroke prevention; importance of adherence to anticoagulant exactly as prescribed; bleeding risk associated with Eliquis and importance of self-monitoring for signs/symptoms of bleeding; avoidance of NSAIDs due to increased bleeding risk with anticoagulants; -Recommended to continue current medication  Heart Failure / Hypertension (Goal: BP < 140/90, prevent exacerbations) -Controlled - pt is weighing every day, she takes torsemide when weight increased 2+ lbs - last dose was 2-3 weeks ago; she is still taking potassium daily; K was 4.9 when last checked 11/10/20 -Last ejection fraction: 25-30% (Date: 06/2020) -HF type: Combined Systolic and Diastolic -NYHA Class: I (no actitivty limitation) -Current home BP/HR readings: 113-136/73-80; HR 70-90 -Current treatment: Metoprolol succinate  100 mg daily Digoxin 0.125 mg - 1/2 tab daily Torsemide 20 mg daily PRN Potassium chloride 20 mEq - 2 tab daily -Medications previously tried: [place] (weakness), Ace-inhibitor (cough), losartan (low BP) ; cardiologist advised avoid Entresto, spironolactone due to low BP -Educated on BP goals and benefits of medications for prevention of heart attack, stroke and kidney damage;Importance of home blood pressure monitoring; -Discussed potassium is usually only necessary when taking torsemide - she is at risk for hyperkalemia given torsemide use only every 2-3 weeks -Recommend BMP at next PCP visit (within 1 month)  Depression (Goal: manage symptoms) -Not ideally controlled - pt reports feeling more depressed lately; she reports she has dealt with depression for many years; pt has done therapy ~20 years ago and did not have a good experience, she declines trying therapy again -Pt has been on sertraline for ~10 years and worries it is "wearing off"; she started bupropion earlier this year and noticed some improvement -Current treatment: Sertraline 50 mg - 2 tab daily Bupropion SR 100 mg daily -PHQ2: 6 -Connected with PCP for mental health support -Counseled on therapeutic options: increase sertraline to 150 mg, change sertraline to another SSRI/SNRI, or change bupropion to XL 150 mg; pt opted to change bupropion -Recommend to change bupropion to XL 150 mg; may consider titrating up to 300 mg next month if needed  Osteoporosis (Goal prevent fractures) -Not ideally controlled - pt has no DEXA on file and is not taking medication to improve bone density -Dx from chart review 2012. Hx hip fracture and multiple falls. Pt has declined follow up DEXA scans previously -Current treatment  Vitamin D 1000 IU daily -Assessed her history of hip fracture - appears to be fragility fracture, which qualifies her for treatment of osteoporosis; given significant GI and reflux issues would avoid oral  bisphosphonates; she is a good candidate for Prolia or zoledronic acid injections -Recommend [id number] units of vitamin D daily. Recommend 1200 mg of calcium daily from dietary and supplemental sources. Recommend weight-bearing and muscle strengthening exercises for building and maintaining bone density. -Recommended DEXA scan to evaluate bone density - pt declined -Consider starting Prolia injections given hx of hip fracture, hx multiple falls - will readdress at follow up  IBS-D / Lymphocytic Colitis (Goal: manage symtoms) -Uncontrolled - pt reports diarrhea is actually worse since starting budesonide, she is up 4-5 times during the night to use bathroom; she plans to contact GI today -colitis flare as of 03/09/21; follows with GI -Current treatment  Budesonide EC 9 mg x 6 weeks, 6 mg x 2 weeks, 3 mg x 2 weeks (from 03/09/21 - ) Cholestyramine 4 g daily - helped at first Loperamide 2 mg PRN -takes 2-3 per day -Counseled on purpose of budesonide given LC flare -Advised pt to contact GI for further management  GERD (Goal: manage symptoms) -Not ideally controlled - pt stopped pantoprazole due to coughing; reports reflux at night; pt reports hoarseness -Current treatment  Pantoprazole 40 mg daily - not taking Tums PRN Pepto Bismol PRN -Recommend trial of famotidine 20  mg daily at dinner  Osteoarthritis (Goal: manage pain) -Controlled - per pt report -Hx ankle fracture, hip fracture -Current treatment  Tylenol 500 mg PRN - 2 per day Aspercreme PRN -Counseled on max daily dose of Tylenol 3000 mg/day -Recommended to continue current medication  OAB (Goal: reduce urinary frequency) -Uncontrolled - pt reports "bladder never completely empties"; pt used to see Dr McDiarmid; wears depends; pt feels she cannot control when she has to go and does not make it; she wants to try medication -Current treatment  None -Medications previously tried: Myrbetriq -Discussed benefits of beta-3 agonists  vs anticholinergics; she did well with Myrbetriq but high cost was a barrier; pt may actually benefit from anticholinergic effects given chronic diarrhea; per formulary oxybutynin is preferred (Tier 2) followed by solifenacin and Myrbetriq (both Tier 3) -Recommend oxybutynin ER 5 mg daily at night  Health Maintenance -Vaccine gaps: Covid booster, TDAP  Patient Goals/Self-Care Activities Patient will:  - take medications as prescribed as evidenced by patient report and record review focus on medication adherence by pill box check blood pressure periodically, document, and provide at future appointments weigh daily, and contact provider if weight gain of 5+ lbs in a week -Try famotidine (Pepcid) 20 mg with dinner for reflux -Contact GI about worsening diarrhea -Make PCP appt for January 2023 - updated labwork needed      Ms. Turton was given information about Chronic Care Management services today including:  CCM service includes personalized support from designated clinical staff supervised by her physician, including individualized plan of care and coordination with other care providers 24/7 contact phone numbers for assistance for urgent and routine care needs. Standard insurance, coinsurance, copays and deductibles apply for chronic care management only during months in which we provide at least 20 minutes of these services. Most insurances cover these services at 100%, however patients may be responsible for any copay, coinsurance and/or deductible if applicable. This service may help you avoid the need for more expensive face-to-face services. Only one practitioner may furnish and bill the service in a calendar month. The patient may stop CCM services at any time (effective at the end of the month) by phone call to the office staff.  Patient agreed to services and verbal consent obtained.   Patient verbalizes understanding of instructions provided today and agrees to view in [place].   Telephone follow up appointment with pharmacy team member scheduled for: 1 month  Charlene Brooke, PharmD, [facility] Clinical Pharmacist [place] [facility] [phone]

## 2021-03-24 NOTE — Telephone Encounter (Signed)
Patient called to report urinary urgency and frequency as well as inability to empty bladder fully. She thinks she may have another UTI, she reports it feels the same as as the UTI she had in October.  Informed patient I would reach out to PCP/medical assistant for next steps, as urinalysis/culture may be needed. Pt voiced understanding and would like call back with next steps.

## 2021-03-24 NOTE — Telephone Encounter (Signed)
I am unavailable/out of clinic today and tomorrow.  Recommend phone visit and getting u/a and ucx done at lab drop off it all possible.  Thanks.

## 2021-03-24 NOTE — Progress Notes (Signed)
Chronic Care Management Pharmacy Note  03/24/2021 Name:  [name] MRN:  [id number] DOB:  06/04/1930  Summary: -Pt reports urinary incontinence and frequency, up 4-5 times at night using bathroom; she had improvement with Myrbetriq earlier this year but could not afford copay; she may actually benefit from anticholinergic given issues with chronic diarrhea; per formulary the preferred option is oxybutynin ER (Tier 2) -Pt reports worsening depression; she noted some improvement when bupropion SR 100 mg was started and she continues on this; discussed options to increase sertraline or bupropion or change medications - pt would prefer to increase bupropion; notably, she declined CBT today -Pt is taking torsemide PRN for wt gain 2+ lbs overnight - last use 2-3 weeks ago; she is still taking potassium 40 mEq daily and is at risk for hyperkalemia given infrequent diuretic use (she does deny s/sx hyperkalemia) -Pt is supposed to get digoxin level taken at next lab draw (ordered by cardiologist); this needs to be the trough (prior to daily digoxin dose) -Pt is due for repeat lipid panel (last 12/2018 - LDL 58, TRIG 424, nonfasting)  Recommendations/Changes made from today's visit: -Recommend oxybutynin ER 5 mg daily at bedtime -Recommend changing bupropion Sr to XL 150 mg daily; consider titrating up to 300 mg in 1 month if needed -Recommend PCP follow up Jan 2023 when [facility] office is open - pt agreed and will schedule -Recommend repeat BMP (monitor K), lipid panel and digoxin level- either lab visit at [facility] or PCP visit in January  Plan: -Lab visit at [facility] in Dec or PCP visit in Jan 2023 - TBD -Pharmacist follow up televisit scheduled for 1 month   Subjective: [name] is an 87 y.o. year old female who is a primary patient of Damita Dunnings, Elveria Rising, MD.  The CCM team was consulted for assistance with disease management and care coordination needs.    Engaged  with patient by telephone for initial visit in response to provider referral for pharmacy case management and/or care coordination services.   Consent to Services:  The patient was given the following information about Chronic Care Management services today, agreed to services, and gave verbal consent: 1. CCM service includes personalized support from designated clinical staff supervised by the primary care provider, including individualized plan of care and coordination with other care providers 2. 24/7 contact phone numbers for assistance for urgent and routine care needs. 3. Service will only be billed when office clinical staff spend 20 minutes or more in a month to coordinate care. 4. Only one practitioner may furnish and bill the service in a calendar month. 5.The patient may stop CCM services at any time (effective at the end of the month) by phone call to the office staff. 6. The patient will be responsible for cost sharing (co-pay) of up to 20% of the service fee (after annual deductible is met). Patient agreed to services and consent obtained.  Patient Care Team: [name], MD as PCP - General Croitoru, Dani Gobble, MD as PCP - Cardiology (Cardiology) Shon Hough, MD as Consulting Physician (Ophthalmology) Eula Listen, DDS as Referring Physician (Dentistry) Melrose Nakayama, MD as Consulting Physician (Orthopedic Surgery) Charlton Haws, [facility] as Pharmacist (Pharmacist)  Patient lives at home with her husband. She is currently using a walker to get around after knee and ankle surgeries earlier this year.  Recent office visits: 01/19/2021 - Elsie Stain, MD - Patient Message - Start: cephALEXin (KEFLEX) 500 MG capsule Take 1  capsule (500 mg total) by mouth 2 (two) times daily due to dysuria.   10/15/2020 - Elsie Stain, MD - F/U R ankle fx/SNF palcement. In wheelchair, not weight-baring. Consulted w/ Dr Oval Linsey re: torsemide dosing given inability to weigh at home - advised  torsemide twice a week and repeat BMP in a few weeks. Labs: CBC and BMP. Stop due to patient not taking: methocarbamol, prednisone, tramadol  Recent consult visits: 03/10/2021 - Sanda Klein, MD - Cardiology - F/U Afib, HF. Orders: Digoxin level (with next PCP visit) and EKG. No medication changes.   03/09/2021 - Ellouise Newer, PA - Gastroenterology - Patient presented for IBS-D, Lymphocytic colitis. Start: Budesonide 9 mg x 6 weeks, then taper down to 6 mg x 2 weeks, then 3 mg x 2 weeks  12/15/2020 - [name], MD - Cardiology - Patient presented for left bundle branch block. Start: pantoprazole (PROTONIX) 40 MG tablet due to GERD, cough and hoarseness. Stop losartan (low BP, fatigue)  12/12/2020 - Laurann Montana, NP - Cardiology - Patient Message - Change: Losartan from 25 mg daily to 12.5 mg daily due to Hypotension. Stop due to patient reporting not taking: Farxiga 5 mg due to side effects of dry mouth and dry eyes.   11/12/2020 - Alvina Filbert, LPN - Cardiology - Patient Message - Stop: Klor Con 20 meq packets due to cost. Start: potassium chloride SA (KLOR-CON) 20 MEQ tablet - Take 2 tablets (40 mEq total) by mouth daily.  11/10/2020 Elly Modena, LPN - Cardiology - Patient Message - Start: Klor Con 20 meq packets. Dissolve 1 packet in cold water or beverage twice daily after meals. Patient requested another form of potassium as pills are too hard to take.   10/08/2020 - [place] - Patient presented for difficulty in walking, unspecified atrial fibrillation, chronic combined systolic and diastolic heart failure and displaced bimalleolar fracture of right lower leg. No other information.  10/07/2020 - Melrose Nakayama - Orthopedic Surgery - Patient presented for pain in right shoulder. Procedures: PR BETAMETHASONE ACET&SOD PHOSP, CHG X-RAY ANKLE 3+ VW and PR DRAIN/INJECT LARGE JOINT/BURSA. No other information.  10/01/2020 - [place] -  Patient presented for difficulty in walking. No other information.  09/21/2020 - Alvina Filbert, LPN - Patient Message - Advised to take Torsemide daily for 3 days only them resume PRN for weight gain.   Hospital visits: Medication Reconciliation was completed by comparing discharge summary, patients EMR and Pharmacy list, and upon discussion with patient.   Admitted to the hospital on 09/22/2020 due to Ankle surgery s/p fracture. Discharge date was 09/24/2020. Discharged from [place]?Medications Started at [facility] Discharge:?? -started methocarbamol 500 MG tablet PRN -started tramadol 50 mg PRN    Medications that remain the same after Hospital Discharge:??  -All other medications will remain the same.     Admission 06/23/20-06/25/20: acute HF exacerbation - 10 lb wt gain after knee surgery. ECHO EF 25-30%; diuresed 4.5 L. Started Farxiga 10 mg daily, Start Losartan 25 mg, plan to transition to Entresto and spironolactone.  Admission 06/16/20-06/17/20: knee surgery repair.  Objective:  Lab Results  Component Value Date   CREATININE 0.72 11/10/2020   BUN 14 11/10/2020   GFR 63.27 10/15/2020   GFRNONAA >60 09/22/2020   GFRAA 83 05/28/2020   NA 135 11/10/2020   K 4.9 11/10/2020   CALCIUM 9.5 11/10/2020   CO2 25 11/10/2020   GLUCOSE 82 11/10/2020  Lab Results  Component Value Date/Time   HGBA1C 5.4 09/22/2020 11:10 AM   HGBA1C 5.4 06/24/2020 12:19 AM   GFR 63.27 10/15/2020 01:24 PM   GFR 66.35 04/30/2020 12:40 PM    Last diabetic Eye exam: No results found for: HMDIABEYEEXA  Last diabetic Foot exam: No results found for: HMDIABFOOTEX   Lab Results  Component Value Date   CHOL 261 (H) 12/27/2018   HDL 32.20 (L) 12/27/2018   LDLCALC 73 02/05/2018   LDLDIRECT 58.0 12/27/2018   TRIG (H) 12/27/2018    424.0 Triglyceride is over 400; calculations on Lipids are invalid.   CHOLHDL 8 12/27/2018    Hepatic Function Latest Ref Rng & Units 06/24/2020  04/30/2020 10/29/2019  Total Protein 6.5 - 8.1 g/dL 5.8(L) 6.5 6.5  Albumin 3.5 - 5.0 g/dL 3.1(L) 3.9 3.8  AST 15 - 41 U/L '27 16 15  ' ALT 0 - 44 U/L '19 11 8  ' Alk Phosphatase 38 - 126 U/L 73 84 85  Total Bilirubin 0.3 - 1.2 mg/dL 1.6(H) 0.8 0.5  Bilirubin, Direct 0.0 - 0.2 mg/dL 0.5(H) - -    Lab Results  Component Value Date/Time   TSH 4.356 06/24/2020 12:19 AM   TSH 2.53 10/29/2019 12:36 PM   TSH 4.77 (H) 09/10/2019 12:29 PM    CBC Latest Ref Rng & Units 10/15/2020 09/22/2020 06/23/2020  WBC 4.0 - 10.5 K/uL 8.5 7.3 8.9  Hemoglobin 12.0 - 15.0 g/dL 13.0 12.1 14.0  Hematocrit 36.0 - 46.0 % 39.8 37.6 42.7  Platelets 150.0 - 400.0 K/uL 274.0 169 322    Lab Results  Component Value Date/Time   VD25OH 36.99 03/28/2019 12:25 PM   VD25OH 14.37 (L) 12/27/2018 04:57 PM    Clinical ASCVD: Yes  The ASCVD Risk score (Arnett DK, et al., 2019) failed to calculate for the following reasons:   The 2019 ASCVD risk score is only valid for ages 57 to 49   The patient has a prior MI or stroke diagnosis    Depression screen [facility] 2/9 01/11/2019  Decreased Interest 3  Down, Depressed, Hopeless 3  PHQ - 2 Score 6  Some recent data might be hidden     CHA2DS2-VASc Score = 6  The patient's score is based upon: CHF History: 1 HTN History: 1 Diabetes History: 0 Stroke History: 0 Vascular Disease History: 1 Age Score: 2 Gender Score: 1      Social History   Tobacco Use  Smoking Status Never  Smokeless Tobacco Never   BP Readings from Last 3 Encounters:  03/10/21 110/60  03/09/21 104/68  12/18/20 90/78   Pulse Readings from Last 3 Encounters:  03/10/21 87  03/09/21 (!) 43  12/18/20 89   Wt Readings from Last 3 Encounters:  03/10/21 146 lb (66.2 kg)  03/09/21 145 lb (65.8 kg)  12/18/20 148 lb (67.1 kg)   BMI Readings from Last 3 Encounters:  03/10/21 24.30 kg/m  03/09/21 24.13 kg/m  12/18/20 24.63 kg/m    Assessment/Interventions: Review of patient past medical history,  allergies, medications, health status, including review of consultants reports, laboratory and other test data, was performed as part of comprehensive evaluation and provision of chronic care management services.   SDOH:  (Social Determinants of Health) assessments and interventions performed: Yes  SDOH Screenings   Alcohol Screen: Low Risk    Last Alcohol Screening Score (AUDIT): 0  Depression (PHQ2-9): Not on file  Financial Resource Strain: Low Risk    Difficulty of Paying Living Expenses: Not  hard at all  Food Insecurity: No Food Insecurity   Worried About Charity fundraiser in the Last Year: Never true   Ran Out of Food in the Last Year: Never true  Housing: Low Risk    Last Housing Risk Score: 0  Physical Activity: Inactive   Days of Exercise per Week: 0 days   Minutes of Exercise per Session: 0 min  Social Connections: Not on file  Stress: Not on file  Tobacco Use: Low Risk    Smoking Tobacco Use: Never   Smokeless Tobacco Use: Never   Passive Exposure: Not on file  Transportation Needs: No Transportation Needs   Lack of Transportation (Medical): No   Lack of Transportation (Non-Medical): No    CCM Care Plan  Allergies  Allergen Reactions   Ace Inhibitors Cough   Warfarin Sodium Other (See Comments)    DOSE RELATED PHARMACOLOGIC EFFECT "bleed out"   Famotidine Other (See Comments)    GI upset   Codeine Other (See Comments)    sedation   Delsym [Dextromethorphan Polistirex Er] Other (See Comments)    dizziness   Lactose Intolerance (Gi) Diarrhea   Lasix [Furosemide] Diarrhea    Oral Lasix gives her diarrhea, tolerates IV Rx   Phenylephrine Palpitations and Other (See Comments)    Nasal spray- "likely increase in nasal congestion".    Sulfamethoxazole-Trimethoprim Nausea And Vomiting    GI intolerance.    Medications Reviewed Today     Reviewed by Charlton Haws, [facility] (Pharmacist) on 03/24/21 at [phone]  Med List Status: <None>   Medication Order  Taking? Sig Documenting Provider Last Dose Status Informant  acetaminophen (TYLENOL) 500 MG tablet [id number] Yes Take 1,000 mg by mouth in the morning and at bedtime. [provider] Taking Active Self  budesonide (ENTOCORT EC) 3 MG 24 hr capsule [id number] Yes Take 3 capsules (9 mg total) by mouth daily for 42 days, THEN 2 capsules (6 mg total) daily for 14 days, THEN 1 capsule (3 mg total) daily for 14 days. Levin Erp, [place] Taking Active   buPROPion ER [facility] SR) 100 MG 12 hr tablet [id number] Yes Take 100 mg by mouth daily. [provider] Taking Active   calcium carbonate (TUMS - DOSED IN MG ELEMENTAL CALCIUM) 500 MG chewable tablet [id number] Yes Chew 500 mg by mouth 2 (two) times daily as needed for indigestion or heartburn. [provider] Taking Active Self  cholecalciferol (VITAMIN D3) 25 MCG (1000 UNIT) tablet [id number] Yes Take 1,000 Units by mouth in the morning. [provider] Taking Active Self  cholestyramine (QUESTRAN) 4 g packet [id number] Yes TAKE 1 PACKET (4 G TOTAL) BY MOUTH DAILY WITH SUPPER. Gatha Mayer, MD Taking Active   digoxin (LANOXIN) 0.125 MG tablet [id number] Yes TAKE 1 TABLET BY MOUTH EVERY DAY Bhagat, Bhavinkumar, PA Taking Active   ELIQUIS 2.5 MG TABS tablet [id number] Yes TAKE 1 TABLET BY MOUTH TWICE A DAY [name], MD Taking Active   loperamide (IMODIUM) 2 MG capsule [id number] Yes Take 2 mg by mouth 3 (three) times daily as needed for diarrhea or loose stools.  [provider] Taking Active Self  Menthol-Methyl Salicylate [facility] PAIN RELIEF PATCH EX) [id number] Yes Apply 2 patches topically daily as needed (pain). [provider] Taking Active Self  metoprolol succinate (TOPROL-XL) 100 MG 24 hr tablet [id number] Yes Take 1 tablet (100 mg total) by mouth daily. Take with or immediately following a meal. [name], MD Taking Active Self  Patient not taking:  Discontinued 03/24/21 0956  (Side effect (s))   Polyethyl Glycol-Propyl Glycol (SYSTANE OP) [id number] Yes Place 1-2 drops into the left eye 3 (three) times daily as needed (dryness). [provider] Taking Active Self  potassium chloride SA (KLOR-CON) 20 MEQ tablet [id number] Yes Take 2 tablets (40 mEq total) by mouth daily. [name], MD Taking Active   sertraline (ZOLOFT) 50 MG tablet [id number] Yes Take 2 tablets (100 mg total) by mouth daily. [name], MD Taking Active   torsemide [facility]) 20 MG tablet [id number] Yes Take 1 tablet (20 mg total) by mouth daily as needed. if 3 pound weight gain overnight or 5 pounds in a week. 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 12/03/2019   Left knee pain 12/03/2019   Moderate to severe pulmonary hypertension ([place]) 11/26/2019   Tremor of both hands 09/29/2019   Dysuria 08/06/2019   Severe tricuspid regurgitation    S/P hip hemiarthroplasty 08/04/2019   Ear symptom 12/30/2018   Chronic diarrhea 09/07/2017   Hoarseness of voice 09/07/2017   Total knee replacement status, left 04/25/17 05/06/2017   Primary osteoarthritis of left knee 04/25/2017   DJD (degenerative joint disease) 04/25/2017   Healthcare maintenance 02/08/2017   Vitamin D deficiency 02/08/2017   Microscopic colitis 09/27/2016   IBS (irritable bowel syndrome) 08/31/2016   At risk for falling 01/07/2016   Chronic combined systolic and diastolic heart failure (HCC)    Rectus sheath hematoma- no anticoagulation    Atrial fibrillation with RVR- CHADs VASc=4 06/18/2015   Diarrhea 06/12/2015   Advance care planning 10/23/2014   Leg  edema 06/27/2012   Medicare annual wellness visit, subsequent 03/27/2012   Depression 03/05/2012   EDEMA 06/07/2010   Osteoporosis 05/23/2010   KNEE PAIN, LEFT 03/02/2010   LBBB (left bundle branch block) 10/28/2008   POST MI SEPTAL DEFECT 06/24/2008   Cough 06/24/2008   Nonischemic cardiomyopathy ([place]) 05/30/2008   ATRIAL FIBRILLATION 05/30/2008   HYPONATREMIA, HX OF 05/30/2008    Immunization History  Administered Date(s) Administered   Fluad Quad(high Dose 65+) 12/14/2018, 01/23/2020   Influenza Whole 12/10/2009, 01/01/2012   Influenza,inj,Quad PF,6+ Mos 12/30/2013, 01/08/2015, 12/30/2015, 12/30/2016, 01/04/2018   PFIZER(Purple Top)SARS-COV-2 Vaccination 05/04/2019, 05/25/2019, 01/12/2020   Pneumococcal Conjugate-13 10/23/2014   Pneumococcal Polysaccharide-23 04/11/2006   Td 05/13/2010   Zoster Recombinat (Shingrix) 01/24/2019, 04/03/2019   Zoster, Live 04/11/2006    Conditions to be addressed/monitored:  Hypertension, Hyperlipidemia, Atrial Fibrillation, Heart Failure, Coronary Artery Disease, GERD, Depression, Osteoporosis, Osteoarthritis, and Overactive Bladder  Care Plan : [place]  Updates made by Charlton Haws, [place] since 03/24/2021 12:00 AM     Problem: Hypertension, Hyperlipidemia, Atrial Fibrillation, Heart Failure, Coronary Artery Disease, GERD, Depression, Osteoporosis, Osteoarthritis, and Overactive Bladder   Priority: High     Long-Range Goal: Disease mgmt   Start Date: 03/24/2021  Expected End Date: 03/24/2022  This Visit's Progress: On track  Priority: High  Note:   Current Barriers:  Unable to independently monitor therapeutic efficacy Unable to achieve control of OAB, IBS-D   Pharmacist Clinical Goal(s):  Patient will achieve adherence to monitoring guidelines and medication adherence to achieve therapeutic efficacy achieve improvement in OAB, IBS-D as evidenced by patient report through collaboration with PharmD and provider.    Interventions: 1:1 collaboration with [name], MD regarding development and update of comprehensive plan of care as evidenced by provider attestation and co-signature Inter-disciplinary care team collaboration (see longitudinal plan of care) Comprehensive medication review performed; medication list updated in electronic medical record  Hyperlipidemia: (LDL goal < 70) -Controlled - LDL was at goal (58) but last checked 12/2018; TRIG elevated at that time but likely not fasting given late afternoon lab time (16:57) -Hx MI. Noted statin intolerance in chart. -Current treatment: Cholestyramine 4 g daily PM (for diarrhea) -Educated on Cholesterol goals;  -Recommend repeat lipid panel at next PCP visit  Persistent Atrial Fibrillation (Goal: prevent stroke and major bleeding) -Controlled - pt endorses compliance with medications and denies issues; digoxin level ordered by cardiologist for next PCP visit - needs to be prior to daily dose of digoxin. -CHADSVASC: 6; chronic LBBB; hx of falls -Current treatment: Metoprolol succinate 100 mg daily Digoxin 0.125 mg - 1/2 tab daily Eliquis 2.5 mg BID -Medications previously tried: amiodarone -Counseled on increased risk of stroke due to Afib and benefits of anticoagulation for stroke prevention; importance of adherence to anticoagulant exactly as prescribed; bleeding risk associated with Eliquis and importance of self-monitoring for signs/symptoms of bleeding; avoidance of NSAIDs due to increased bleeding risk with anticoagulants; -Recommended to continue current medication  Heart Failure / Hypertension (Goal: BP < 140/90, prevent exacerbations) -Controlled - pt is weighing every day, she takes torsemide when weight increased 2+ lbs - last dose was 2-3 weeks ago; she is still taking potassium daily; K was 4.9 when last checked 11/10/20 -Last ejection fraction: 25-30% (Date: 06/2020) -HF type: Combined Systolic and Diastolic -NYHA Class: I (no  actitivty limitation) -Current home BP/HR readings: 113-136/73-80; HR 70-90 -Current treatment: Metoprolol succinate 100 mg daily Digoxin 0.125 mg - 1/2 tab daily Torsemide 20 mg daily PRN Potassium chloride 20 mEq - 2 tab daily -Medications previously tried: [place] (weakness), Ace-inhibitor (cough), losartan (low BP) ; cardiologist advised avoid Entresto, spironolactone due to low BP -Educated on BP goals and benefits of medications for prevention of heart attack, stroke and kidney damage;Importance of home blood pressure monitoring; -Discussed potassium is usually only necessary when taking torsemide - she is at risk for hyperkalemia given torsemide use only every 2-3 weeks -Recommend BMP at next PCP visit (within 1 month)  Depression (Goal: manage symptoms) -Not ideally controlled - pt reports feeling more depressed lately; she reports she has dealt with depression for many years; pt has done therapy ~20 years ago and did not have a good experience, she declines trying therapy again -Pt has been on sertraline for ~10 years and worries it is "wearing off"; she started bupropion earlier this year and noticed some improvement -Current treatment: Sertraline 50 mg - 2 tab daily Bupropion SR 100 mg daily -PHQ2: 6 -Connected with PCP for mental health support -Counseled on therapeutic options: increase sertraline to 150 mg, change sertraline to another SSRI/SNRI, or change bupropion to XL 150 mg; pt opted to change bupropion -Recommend to change bupropion to XL 150 mg; may consider titrating up to 300 mg next month if needed  Osteoporosis (Goal prevent fractures) -Not ideally controlled - pt has no DEXA on file and is not taking medication to improve bone density -Dx from chart review 2012. Hx hip fracture and  multiple falls. Pt has declined follow up DEXA scans previously -Current treatment  Vitamin D 1000 IU daily -Assessed her history of hip fracture - appears to be fragility fracture,  which qualifies her for treatment of osteoporosis; given significant GI and reflux issues would avoid oral bisphosphonates; she is a good candidate for Prolia or zoledronic acid injections -Recommend [phone] units of vitamin D daily. Recommend 1200 mg of calcium daily from dietary and supplemental sources. Recommend weight-bearing and muscle strengthening exercises for building and maintaining bone density. -Recommended DEXA scan to evaluate bone density - pt declined -Consider starting Prolia injections given hx of hip fracture, hx multiple falls - will readdress at follow up  IBS-D / Lymphocytic Colitis (Goal: manage symtoms) -Uncontrolled - pt reports diarrhea is actually worse since starting budesonide, she is up 4-5 times during the night to use bathroom; she plans to contact GI today -colitis flare as of 03/09/21; follows with GI -Current treatment  Budesonide EC 9 mg x 6 weeks, 6 mg x 2 weeks, 3 mg x 2 weeks (from 03/09/21 - ) Cholestyramine 4 g daily - helped at first Loperamide 2 mg PRN -takes 2-3 per day -Counseled on purpose of budesonide given LC flare -Advised pt to contact GI for further management  GERD (Goal: manage symptoms) -Not ideally controlled - pt stopped pantoprazole due to coughing; reports reflux at night; pt reports hoarseness -Current treatment  Pantoprazole 40 mg daily - not taking Tums PRN Pepto Bismol PRN -Recommend trial of famotidine 20 mg daily at dinner  Osteoarthritis (Goal: manage pain) -Controlled - per pt report -Hx ankle fracture, hip fracture -Current treatment  Tylenol 500 mg PRN - 2 per day Aspercreme PRN -Counseled on max daily dose of Tylenol 3000 mg/day -Recommended to continue current medication  OAB (Goal: reduce urinary frequency) -Uncontrolled - pt reports "bladder never completely empties"; pt used to see Dr McDiarmid; wears depends; pt feels she cannot control when she has to go and does not make it; she wants to try  medication -Current treatment  None -Medications previously tried: Myrbetriq -Discussed benefits of beta-3 agonists vs anticholinergics; she did well with Myrbetriq but high cost was a barrier; pt may actually benefit from anticholinergic effects given chronic diarrhea; per formulary oxybutynin is preferred (Tier 2) followed by solifenacin and Myrbetriq (both Tier 3) -Recommend oxybutynin ER 5 mg daily at night  Health Maintenance -Vaccine gaps: Covid booster, TDAP  Patient Goals/Self-Care Activities Patient will:  - take medications as prescribed as evidenced by patient report and record review focus on medication adherence by pill box check blood pressure periodically, document, and provide at future appointments weigh daily, and contact provider if weight gain of 5+ lbs in a week -Try famotidine (Pepcid) 20 mg with dinner for reflux -Contact GI about worsening diarrhea -Make PCP appt for January 2023 - updated labwork needed      Medication Assistance: None required.  Patient affirms current coverage meets needs.  Compliance/Adherence/Medication fill history: Care Gaps: None  Star-Rating Drugs: None  Patient's preferred pharmacy is:  [place], [place] - [address], [address], [place] [address] Phone: [phone] Fax: [phone]  [id number] [id number]: [phone]: [phone] Uses pill box? Yes Pt endorses 100% compliance  We discussed: Benefits of medication synchronization, packaging and delivery as well as enhanced pharmacist oversight with [employer]. Patient decided to: Continue current  medication management strategy  Care Plan and Follow Up Patient Decision:  Patient agrees to Care Plan and Follow-up.  Plan: Telephone follow up appointment with care management team member scheduled for:  1 month  Charlene Brooke,  PharmD, [facility] Clinical Pharmacist [facility] [phone]

## 2021-03-24 NOTE — Telephone Encounter (Signed)
Patient is scheduled with Mort Sawyers, NP 03/26/21 at 9:40 am.

## 2021-03-25 ENCOUNTER — Other Ambulatory Visit: Payer: Self-pay | Admitting: Internal Medicine

## 2021-03-25 ENCOUNTER — Encounter: Payer: Self-pay | Admitting: Cardiovascular Disease

## 2021-03-25 ENCOUNTER — Other Ambulatory Visit: Payer: Self-pay | Admitting: Cardiovascular Disease

## 2021-03-25 ENCOUNTER — Other Ambulatory Visit: Payer: Self-pay | Admitting: Physician Assistant

## 2021-03-25 MED ORDER — PANTOPRAZOLE SODIUM 40 MG PO TBEC: 40.0000 mg | DELAYED_RELEASE_TABLET | Freq: Every day | ORAL | 11 refills | Status: DC

## 2021-03-25 NOTE — Telephone Encounter (Signed)
Also patient of Dr. Duke Salvia. Med list says patient is taking Metoprolol Succ. 100 mg. Routing to Dr. Duke Salvia to advise.

## 2021-03-25 NOTE — Telephone Encounter (Signed)
Pt of Dr. Royann Shivers

## 2021-03-26 ENCOUNTER — Encounter: Payer: Self-pay | Admitting: Family

## 2021-03-26 ENCOUNTER — Ambulatory Visit (INDEPENDENT_AMBULATORY_CARE_PROVIDER_SITE_OTHER): Payer: Medicare Other | Admitting: Family

## 2021-03-26 ENCOUNTER — Other Ambulatory Visit: Payer: Self-pay

## 2021-03-26 VITALS — BP 122/70 | HR 112 | Temp 97.2°F | Ht 60.0 in | Wt 149.0 lb

## 2021-03-26 DIAGNOSIS — R350 Frequency of micturition: Secondary | ICD-10-CM

## 2021-03-26 DIAGNOSIS — N3001 Acute cystitis with hematuria: Secondary | ICD-10-CM

## 2021-03-26 LAB — POCT URINALYSIS DIP (MANUAL ENTRY)
Bilirubin, UA: NEGATIVE
Glucose, UA: NEGATIVE mg/dL
Nitrite, UA: POSITIVE — AB
Protein Ur, POC: 30 mg/dL — AB
Spec Grav, UA: 1.025 (ref 1.010–1.025)
Urobilinogen, UA: 0.2 E.U./dL
pH, UA: 6 (ref 5.0–8.0)

## 2021-03-26 MED ORDER — BUPROPION HCL ER (SR) 150 MG PO TB12: 150.0000 mg | ORAL_TABLET | Freq: Every day | ORAL | 1 refills | Status: DC

## 2021-03-26 MED ORDER — AMOXICILLIN-POT CLAVULANATE 875-125 MG PO TABS: 1.0000 | ORAL_TABLET | Freq: Two times a day (BID) | ORAL | 0 refills | Status: DC

## 2021-03-26 MED ORDER — OXYBUTYNIN CHLORIDE ER 5 MG PO TB24: 5.0000 mg | ORAL_TABLET | Freq: Every day | ORAL | 1 refills | Status: DC

## 2021-03-26 NOTE — Progress Notes (Signed)
Established Patient Office Visit  Subjective:  Patient ID: [name], female    DOB: 1930-05-06  Age: 87 y.o. MRN: [id number]  CC:  Chief Complaint  Patient presents with   Urinary Tract Infection    HPI [name] is here today with c/o urinary frequency, increased night time urinating (4-5 times a night), and urinary urgency. No dysuria today. No flank pain no fever and or chills. At the beginning of symptoms she did have some 'twitching burning' when she was peeing, but has resolved.   Sx have been for the past one week.   Did have a UTI back in October, treated with keflex and was asymptomatic.   Past Medical History:  Diagnosis Date   Acute combined systolic and diastolic heart failure ([place]) 05/02/2017   Anemia    Anxiety    Arthritis    "knees" (06/18/2015)   Atrial fibrillation with RVR ([place]) 06/18/2015   h/o sig bleed on coumadin   Cardiogenic shock 02/77/4128   Complication of anesthesia 2010   "w/cardiac cath; got into a psychotic state for ~ 3 days; got me out w/Valium"   Depression    "I have it off and on; not as often as I've gotten older" (06/18/2015)   Diverticulosis    descending and sigmoid colon--Dr. Sharlett Iles   DJD (degenerative joint disease) of knee    left   Dyspnea    with exertion and in morning mostly when wakes up    E coli infection    History of   GERD (gastroesophageal reflux disease)    History of blood transfusion    History of Clostridium difficile colitis    IBS (irritable bowel syndrome)    Insomnia     off chronic ambien 37m as of 10/11, rare/episodic use since then  DCM/CHF   LBBB (left bundle branch block)    Lymphocytic colitis 09/27/2016   Moderate to severe pulmonary hypertension ([place] 11/26/2019   Nonischemic cardiomyopathy ([place]    normal coronaries, EF 15-20% 04/2008, EF 50-55% 2015   Osteoporosis    on DXA 05/2010   Positive PPD    Retroperitoneal bleed    Sciatica    Right   Urinary incontinence     Occassionally    Past Surgical History:  Procedure Laterality Date   ANTERIOR APPROACH HEMI HIP ARTHROPLASTY Left 08/05/2019   Procedure: ANTERIOR APPROACH HEMI HIP ARTHROPLASTY;  Surgeon: GDorna Leitz MD;  [place] [facility]  Service: Orthopedics;  Laterality: Left;   BIOPSY  11/22/2017   Procedure: BIOPSY;  Surgeon: GGatha Mayer MD;  [facility];  Service: Endoscopy;;   CARDIAC CATHETERIZATION  2010   CARDIOVERSION N/A 06/19/2015   Procedure: CARDIOVERSION;  Surgeon: DLarey Dresser MD;  [place] [facility]  Service: Cardiovascular;  Laterality: N/A;   CATARACT EXTRACTION W/ INTRAOCULAR LENS  IMPLANT, BILATERAL Bilateral    COLONOSCOPY WITH PROPOFOL N/A 11/22/2017   Procedure: COLONOSCOPY WITH PROPOFOL;  Surgeon: GGatha Mayer MD;  [facility];  Service: Endoscopy;  Laterality: N/A;   DILATION AND CURETTAGE OF UTERUS     ORIF ANKLE FRACTURE Right 09/22/2020   Procedure: RIGHT ANKLE OPEN REDUCTION INTERNAL FIXATION (ORIF);  Surgeon: DMelrose Nakayama MD;  [facility];  Service: Orthopedics;  Laterality: Right;   TEE WITHOUT CARDIOVERSION N/A 06/19/2015   Procedure: TRANSESOPHAGEAL ECHOCARDIOGRAM (TEE);  Surgeon: DLarey Dresser MD;  [place] [facility] [place]  Service: Cardiovascular;  Laterality: N/A;   TOTAL KNEE ARTHROPLASTY Left 04/25/2017  Procedure: TOTAL KNEE ARTHROPLASTY;  Surgeon: Melrose Nakayama, MD;  Location: [place];  Service: Orthopedics;  Laterality: Left;   TOTAL KNEE ARTHROPLASTY Right 02/18/2020   Procedure: RIGHT TOTAL KNEE ARTHROPLASTY;  Surgeon: Melrose Nakayama, MD;  [facility];  Service: Orthopedics;  Laterality: Right;   TOTAL KNEE ARTHROPLASTY Right 06/16/2020   Procedure: RIGHT RETINACULAR REPAIR;  Surgeon: Melrose Nakayama, MD;  [facility];  Service: Orthopedics;  Laterality: Right;   TOTAL KNEE REVISION Left 12/03/2019   Procedure: LEFT TOTAL KNEE REVISION  OF POYLY EXCHANGE;  Surgeon: Melrose Nakayama, MD;  [facility];   Service: Orthopedics;  Laterality: Left;   VAGINAL HYSTERECTOMY  1979   ovaries intact    Family History  Problem Relation Age of Onset   Colon cancer Father        possible colon cancer   Other Father        esophagus tumor?   Tuberculosis Mother    Breast cancer Neg Hx    Stomach cancer Neg Hx    Pancreatic cancer Neg Hx     Social History   Socioeconomic History   Marital status: Married    Spouse name: Sonia Side    Number of children: 2   Years of education: 13   Highest education level: Not on file  Occupational History   Occupation: Retired    Fish farm manager: RETIRED  Tobacco Use   Smoking status: Never   Smokeless tobacco: Never  Vaping Use   Vaping Use: Never used  Substance and Sexual Activity   Alcohol use: No    Alcohol/week: 0.0 standard drinks   Drug use: No   Sexual activity: Not Currently    Birth control/protection: Post-menopausal, Surgical    Comment: Hysterectomy  Other Topics Concern   Not on file  Social History Narrative   Retired, former [employer]   Married, [place]   Lives with husband   2 grown children, one in [place], one out of state   Tobacco Use - No.    Drug Use - no   teaches Sunday school, reading   Social Determinants of Radio broadcast assistant Strain: Low Risk    Difficulty of Paying Living Expenses: Not hard at all  Food Insecurity: No Food Insecurity   Worried About Charity fundraiser in the Last Year: Never true   Arboriculturist in the Last Year: Never true  Transportation Needs: No Transportation Needs   Lack of Transportation (Medical): No   Lack of Transportation (Non-Medical): No  Physical Activity: Inactive   Days of Exercise per Week: 0 days   Minutes of Exercise per Session: 0 min  Stress: Not on file  Social Connections: Not on file  Intimate Partner Violence: Not At Risk   Fear of Current or Ex-Partner: No   Emotionally Abused: No   Physically Abused: No   Sexually Abused: No    Outpatient  Medications Prior to Visit  Medication Sig Dispense Refill   acetaminophen (TYLENOL) 500 MG tablet Take 1,000 mg by mouth in the morning and at bedtime.     budesonide (ENTOCORT EC) 3 MG 24 hr capsule Take 3 capsules (9 mg total) by mouth daily for 42 days, THEN 2 capsules (6 mg total) daily for 14 days, THEN 1 capsule (3 mg total) daily for 14 days. 168 capsule 0   calcium carbonate (TUMS - DOSED IN MG ELEMENTAL CALCIUM) 500 MG chewable tablet Chew 500 mg by mouth 2 (  two) times daily as needed for indigestion or heartburn.     cholecalciferol (VITAMIN D3) 25 MCG (1000 UNIT) tablet Take 1,000 Units by mouth in the morning.     cholestyramine (QUESTRAN) 4 g packet TAKE 1 PACKET (4 G TOTAL) BY MOUTH DAILY WITH SUPPER. 30 packet 2   digoxin (LANOXIN) 0.125 MG tablet TAKE 1 TABLET BY MOUTH EVERY DAY 90 tablet 3   ELIQUIS 2.5 MG TABS tablet TAKE 1 TABLET BY MOUTH TWICE A DAY 180 tablet 1   loperamide (IMODIUM) 2 MG capsule Take 2 mg by mouth 3 (three) times daily as needed for diarrhea or loose stools.      Menthol-Methyl Salicylate (SALONPAS PAIN RELIEF PATCH EX) Apply 2 patches topically daily as needed (pain).     metoprolol succinate (TOPROL-XL) 100 MG 24 hr tablet Take 1 tablet (100 mg total) by mouth daily. Take with or immediately following a meal. 90 tablet 3   pantoprazole (PROTONIX) 40 MG tablet Take 1 tablet (40 mg total) by mouth daily. 30 tablet 11   Polyethyl Glycol-Propyl Glycol (SYSTANE OP) Place 1-2 drops into the left eye 3 (three) times daily as needed (dryness).     potassium chloride SA (KLOR-CON) 20 MEQ tablet Take 2 tablets (40 mEq total) by mouth daily. 180 tablet 3   sertraline (ZOLOFT) 50 MG tablet Take 2 tablets (100 mg total) by mouth daily. 180 tablet 0   torsemide (DEMADEX) 20 MG tablet Take 1 tablet (20 mg total) by mouth daily as needed. if 3 pound weight gain overnight or 5 pounds in a week. 30 tablet 5   buPROPion ER (WELLBUTRIN SR) 100 MG 12 hr tablet Take 100 mg by  mouth daily.     No facility-administered medications prior to visit.    Allergies  Allergen Reactions   Ace Inhibitors Cough   Warfarin Sodium Other (See Comments)    DOSE RELATED PHARMACOLOGIC EFFECT "bleed out"   Famotidine Other (See Comments)    GI upset   Codeine Other (See Comments)    sedation   Delsym [Dextromethorphan Polistirex Er] Other (See Comments)    dizziness   Lactose Intolerance (Gi) Diarrhea   Lasix [Furosemide] Diarrhea    Oral Lasix gives her diarrhea, tolerates IV Rx   Phenylephrine Palpitations and Other (See Comments)    Nasal spray- "likely increase in nasal congestion".    Sulfamethoxazole-Trimethoprim Nausea And Vomiting    GI intolerance.    ROS Review of Systems  Constitutional:  Negative for chills and fever.  Gastrointestinal:  Negative for abdominal pain.  Genitourinary:  Positive for frequency (with nocturia) and urgency. Negative for difficulty urinating, dysuria, flank pain, hematuria, pelvic pain and vaginal discharge.  Musculoskeletal:  Negative for arthralgias.  All other systems reviewed and are negative.    Objective:    Physical Exam Constitutional:      General: She is not in acute distress.    Appearance: Normal appearance. She is well-developed and well-groomed. She is not ill-appearing.  HENT:     Head: Normocephalic.     Nose: No congestion or rhinorrhea.     Mouth/Throat:     Pharynx: No pharyngeal swelling, oropharyngeal exudate or posterior oropharyngeal erythema.     Tonsils: No tonsillar exudate.  Neck:     Thyroid: No thyroid mass.  Cardiovascular:     Rate and Rhythm: Normal rate and regular rhythm.  Abdominal:     Tenderness: There is no abdominal tenderness.  Musculoskeletal:  Lumbar back: Normal. No tenderness.  Lymphadenopathy:     Cervical:     Right cervical: No superficial cervical adenopathy.    Left cervical: No superficial cervical adenopathy.  Neurological:     Mental Status: She is alert.     BP 122/70    Pulse (!) 112    Temp (!) 97.2 F (36.2 C) (Temporal)    Ht 5' (1.524 m)    Wt 149 lb (67.6 kg)    SpO2 94%    BMI 29.10 kg/m  Wt Readings from Last 3 Encounters:  03/26/21 149 lb (67.6 kg)  03/10/21 146 lb (66.2 kg)  03/09/21 145 lb (65.8 kg)     Health Maintenance Due  Topic Date Due   COVID-19 Vaccine (4 - Booster for Pfizer series) 03/08/2020   TETANUS/TDAP  05/13/2020    There are no preventive care reminders to display for this patient.  Lab Results  Component Value Date   TSH 4.356 06/24/2020   Lab Results  Component Value Date   WBC 8.5 10/15/2020   HGB 13.0 10/15/2020   HCT 39.8 10/15/2020   MCV 95.4 10/15/2020   PLT 274.0 10/15/2020   Lab Results  Component Value Date   NA 135 11/10/2020   K 4.9 11/10/2020   CO2 25 11/10/2020   GLUCOSE 82 11/10/2020   BUN 14 11/10/2020   CREATININE 0.72 11/10/2020   BILITOT 1.6 (H) 06/24/2020   ALKPHOS 73 06/24/2020   AST 27 06/24/2020   ALT 19 06/24/2020   PROT 5.8 (L) 06/24/2020   ALBUMIN 3.1 (L) 06/24/2020   CALCIUM 9.5 11/10/2020   ANIONGAP 9 09/22/2020   EGFR 81 11/10/2020   GFR 63.27 10/15/2020   Lab Results  Component Value Date   HGBA1C 5.4 09/22/2020      Assessment & Plan:   Problem List Items Addressed This Visit       Genitourinary   Acute cystitis with hematuria    Choosing to treat due to urine dipstick. RX sent for augmentin 875/125 mg po bid x 10 days pt to take daily and if tolerated continue for duration of treatment. Increase oral fluids. Due to hematuria. Pt to bring in additional urine to office in two weeks post treatment to verify resolution      Relevant Medications   amoxicillin-clavulanate (AUGMENTIN) 875-125 MG tablet     Other   Urine frequency - Primary    Urinalysis dipstick as well as urine culture ordered. Dipstick with leukocytes, proteinuria, and hematuria. Will treat today for UTI.       Relevant Medications   amoxicillin-clavulanate (AUGMENTIN)  875-125 MG tablet   Other Relevant Orders   POCT urinalysis dipstick (Completed)    Meds ordered this encounter  Medications   amoxicillin-clavulanate (AUGMENTIN) 875-125 MG tablet    Sig: Take 1 tablet by mouth 2 (two) times daily.    Dispense:  20 tablet    Refill:  0    Order Specific Question:   Supervising Provider    Answer:   Diona Browner, AMY E [[id number]]    Follow-up: Return in about 1 month (around 04/26/2021) for with PCP for regular follow up .    Eugenia Pancoast, FNP

## 2021-03-26 NOTE — Addendum Note (Signed)
Addended by: [name] on: 03/26/2021 11:30 PM   Modules accepted: Orders

## 2021-03-26 NOTE — Patient Instructions (Signed)
It was a pleasure seeing you today.   You were found to have a urinary tract infection, you have been prescribed an antibiotic to your preferred pharmacy. Please start antibiotic today as directed.   We are sending your urine for a culture to make sure you do not have a resistant bacteria. We will call you if we need to change your medications.   Please make sure you are drinking plenty of fluids over the next few days.  If your symptoms do not improve over the next 5-7 days, or if they worsen, please let us know. Please also let us know if you have worsening back pain, fevers, chills, or body aches.   Regards,   Adarryl Goldammer  

## 2021-03-28 ENCOUNTER — Other Ambulatory Visit: Payer: Self-pay | Admitting: Family Medicine

## 2021-03-28 DIAGNOSIS — I4891 Unspecified atrial fibrillation: Secondary | ICD-10-CM

## 2021-03-29 DIAGNOSIS — N3001 Acute cystitis with hematuria: Secondary | ICD-10-CM | POA: Insufficient documentation

## 2021-03-29 DIAGNOSIS — R350 Frequency of micturition: Secondary | ICD-10-CM | POA: Insufficient documentation

## 2021-03-29 NOTE — Assessment & Plan Note (Signed)
Choosing to treat due to urine dipstick. RX sent for augmentin 875/125 mg po bid x 10 days pt to take daily and if tolerated continue for duration of treatment. Increase oral fluids. Due to hematuria. Pt to bring in additional urine to office in two weeks post treatment to verify resolution

## 2021-03-29 NOTE — Assessment & Plan Note (Signed)
Urinalysis dipstick as well as urine culture ordered. Dipstick with leukocytes, proteinuria, and hematuria. Will treat today for UTI.

## 2021-04-09 DIAGNOSIS — M6281 Muscle weakness (generalized): Secondary | ICD-10-CM | POA: Diagnosis not present

## 2021-04-09 DIAGNOSIS — M810 Age-related osteoporosis without current pathological fracture: Secondary | ICD-10-CM | POA: Diagnosis not present

## 2021-04-09 DIAGNOSIS — Z9181 History of falling: Secondary | ICD-10-CM | POA: Diagnosis not present

## 2021-04-09 DIAGNOSIS — S82841D Displaced bimalleolar fracture of right lower leg, subsequent encounter for closed fracture with routine healing: Secondary | ICD-10-CM | POA: Diagnosis not present

## 2021-04-09 DIAGNOSIS — M1711 Unilateral primary osteoarthritis, right knee: Secondary | ICD-10-CM | POA: Diagnosis not present

## 2021-04-10 DIAGNOSIS — F339 Major depressive disorder, recurrent, unspecified: Secondary | ICD-10-CM

## 2021-04-10 DIAGNOSIS — M8080XS Other osteoporosis with current pathological fracture, unspecified site, sequela: Secondary | ICD-10-CM | POA: Diagnosis not present

## 2021-04-10 DIAGNOSIS — I4811 Longstanding persistent atrial fibrillation: Secondary | ICD-10-CM

## 2021-04-10 DIAGNOSIS — I5042 Chronic combined systolic (congestive) and diastolic (congestive) heart failure: Secondary | ICD-10-CM | POA: Diagnosis not present

## 2021-04-12 ENCOUNTER — Encounter: Payer: Self-pay | Admitting: Cardiovascular Disease

## 2021-04-12 ENCOUNTER — Other Ambulatory Visit: Payer: Self-pay | Admitting: Cardiovascular Disease

## 2021-04-12 DIAGNOSIS — I4891 Unspecified atrial fibrillation: Secondary | ICD-10-CM

## 2021-04-13 ENCOUNTER — Telehealth: Payer: Self-pay

## 2021-04-13 NOTE — Telephone Encounter (Signed)
Prescription refill request for Eliquis received. Indication: a fib Last office visit: 03/10/21 Scr: 0.72 Age: 88 Weight: 67kg  Dr C, patient is fall risk. You have her on Eliquis 2.5mg  but actually qualifies for 5mg  BID. Continue low dose?

## 2021-04-13 NOTE — Progress Notes (Signed)
Chronic Care Management Pharmacy Assistant   Name: [name]  MRN: [id number] DOB: 11/21/1930  Reason for Encounter: CCM (Appointment Reminder)   Medications: Outpatient Encounter Medications as of 04/13/2021  Medication Sig   acetaminophen (TYLENOL) 500 MG tablet Take 1,000 mg by mouth in the morning and at bedtime.   amoxicillin-clavulanate (AUGMENTIN) 875-125 MG tablet Take 1 tablet by mouth 2 (two) times daily.   budesonide (ENTOCORT EC) 3 MG 24 hr capsule Take 3 capsules (9 mg total) by mouth daily for 42 days, THEN 2 capsules (6 mg total) daily for 14 days, THEN 1 capsule (3 mg total) daily for 14 days.   buPROPion ER (WELLBUTRIN SR) 150 MG 12 hr tablet Take 1 tablet (150 mg total) by mouth daily.   calcium carbonate (TUMS - DOSED IN MG ELEMENTAL CALCIUM) 500 MG chewable tablet Chew 500 mg by mouth 2 (two) times daily as needed for indigestion or heartburn.   cholecalciferol (VITAMIN D3) 25 MCG (1000 UNIT) tablet Take 1,000 Units by mouth in the morning.   cholestyramine (QUESTRAN) 4 g packet TAKE 1 PACKET (4 G TOTAL) BY MOUTH DAILY WITH SUPPER.   digoxin (LANOXIN) 0.125 MG tablet TAKE 1 TABLET BY MOUTH EVERY DAY   ELIQUIS 2.5 MG TABS tablet TAKE 1 TABLET BY MOUTH TWICE A DAY   loperamide (IMODIUM) 2 MG capsule Take 2 mg by mouth 3 (three) times daily as needed for diarrhea or loose stools.    Menthol-Methyl Salicylate (SALONPAS PAIN RELIEF PATCH EX) Apply 2 patches topically daily as needed (pain).   metoprolol succinate (TOPROL-XL) 100 MG 24 hr tablet Take 1 tablet (100 mg total) by mouth daily. Take with or immediately following a meal.   oxybutynin (DITROPAN-XL) 5 MG 24 hr tablet Take 1 tablet (5 mg total) by mouth at bedtime.   pantoprazole (PROTONIX) 40 MG tablet Take 1 tablet (40 mg total) by mouth daily.   Polyethyl Glycol-Propyl Glycol (SYSTANE OP) Place 1-2 drops into the left eye 3 (three) times daily as needed (dryness).   potassium chloride SA (KLOR-CON) 20 MEQ  tablet Take 2 tablets (40 mEq total) by mouth daily.   sertraline (ZOLOFT) 50 MG tablet Take 2 tablets (100 mg total) by mouth daily.   torsemide (DEMADEX) 20 MG tablet Take 1 tablet (20 mg total) by mouth daily as needed. if 3 pound weight gain overnight or 5 pounds in a week.   No facility-administered encounter medications on file as of 04/13/2021.   [name] was contacted to remind her of her upcoming telephone visit with Al Corpus on 04/15/2021 at 3:30 pm. Patient was reminded to have all medications, supplements and any blood glucose and blood pressure readings available for review at appointment.  Are you having any problems with your medications? No  Do you have any concerns you like to discuss with the pharmacist? No  Star Rating Drugs: Medication:   Last Fill: Day Supply Eliquis 2.5 mg                         12/28/2020      90 Digoxin 125 MCG                   12/12/2020      90 Metoprolol 100 mg                  02/09/2021      90 Pantoprazole 40 mg  03/25/2021      90 Potassium Chloride 20 MEQ  11/11/2020      90 Sertraline HCL 50 mg             01/28/2021      90 No Star Rating Drugs Noted  Phil Dopp, CPP notified  Claudina Lick, [place] Clinical Pharmacy Assistant [phone]  Time Spent: 10 Minutes

## 2021-04-14 MED ORDER — APIXABAN 2.5 MG PO TABS: 2.5000 mg | ORAL_TABLET | Freq: Two times a day (BID) | ORAL | 0 refills | Status: DC

## 2021-04-15 ENCOUNTER — Other Ambulatory Visit: Payer: Self-pay

## 2021-04-15 ENCOUNTER — Ambulatory Visit (INDEPENDENT_AMBULATORY_CARE_PROVIDER_SITE_OTHER): Payer: Medicare Other | Admitting: Pharmacist

## 2021-04-15 DIAGNOSIS — R32 Unspecified urinary incontinence: Secondary | ICD-10-CM

## 2021-04-15 DIAGNOSIS — F339 Major depressive disorder, recurrent, unspecified: Secondary | ICD-10-CM

## 2021-04-15 DIAGNOSIS — K580 Irritable bowel syndrome with diarrhea: Secondary | ICD-10-CM

## 2021-04-15 DIAGNOSIS — I5042 Chronic combined systolic (congestive) and diastolic (congestive) heart failure: Secondary | ICD-10-CM

## 2021-04-15 DIAGNOSIS — M8080XS Other osteoporosis with current pathological fracture, unspecified site, sequela: Secondary | ICD-10-CM

## 2021-04-15 DIAGNOSIS — I4811 Longstanding persistent atrial fibrillation: Secondary | ICD-10-CM

## 2021-04-15 NOTE — Progress Notes (Signed)
Chronic Care Management Pharmacy Note  04/20/2021 Name:  [name] MRN:  [id number] DOB:  [birth year]  Summary: -Pt reports improvement in urinary frequency/incontinence on oxybuytnin -Pt reports small improvement in mood since changing bupropion to XL 150 mg -Pt is supposed to get digoxin level taken at next lab draw (ordered by cardiologist); this needs to be the trough (prior to daily digoxin dose) -Assessed her history of hip fracture - appears to be fragility fracture, which qualifies her for treatment of osteoporosis even without DEXA scan (pt has declined repeat DEXA); given significant GI and reflux issues would avoid oral bisphosphonates; she is a good candidate for Prolia or zoledronic acid injections  Recommendations/Changes made from today's visit: -Consider Prolia injections; pt did not agree today but may discuss with PCP -Advised pt to skip digoxin dose the morning of her PCP appt 1/24  Plan: -PCP f/u appt 05/04/21 -[place] will call patient 1 month for general adherence -Pharmacist follow up televisit scheduled for 2 months    Subjective: [name] is an 88 y.o. year old female who is a primary patient of Damita Dunnings, Elveria Rising, MD.  The CCM team was consulted for assistance with disease management and care coordination needs.    Engaged with patient by telephone for follow up visit in response to provider referral for pharmacy case management and/or care coordination services.   Consent to Services:  The patient was given information about Chronic Care Management services, agreed to services, and gave verbal consent prior to initiation of services.  Please see initial visit note for detailed documentation.   Patient Care Team: [name], MD as PCP - General Croitoru, Dani Gobble, MD as PCP - Cardiology (Cardiology) Shon Hough, MD as Consulting Physician (Ophthalmology) Eula Listen, DDS as Referring Physician (Dentistry) Melrose Nakayama, MD as Consulting Physician (Orthopedic Surgery) Charlton Haws, [facility] as Pharmacist (Pharmacist)  Patient lives at home with her husband. She is currently using a walker to get around after knee and ankle surgeries earlier this year.  Recent office visits: 03/26/21 Eugenia Pancoast NP OV: UTI. Rx Augmentin x 10 days  01/19/2021 - Elsie Stain, MD - Patient Message - Start Keflex d/t dysuria.  10/15/2020 - Elsie Stain, MD - F/U R ankle fx/SNF palcement. In wheelchair, not weight-baring. Consulted w/ Dr Oval Linsey re: torsemide dosing given inability to weigh at home - advised torsemide twice a week and repeat BMP in a few weeks. Labs: CBC and BMP. Stop due to patient not taking: methocarbamol, prednisone, tramadol  Recent consult visits: 03/10/2021 - Sanda Klein, MD - Cardiology - F/U Afib, HF. Orders: Digoxin level (with next PCP visit) and EKG. No medication changes.   03/09/2021 - Ellouise Newer, PA - Gastroenterology - Patient presented for IBS-D, Lymphocytic colitis. Start: Budesonide 9 mg x 6 weeks, then taper down to 6 mg x 2 weeks, then 3 mg x 2 weeks  12/15/2020 - [name], MD - Cardiology - Patient presented for left bundle branch block. Start: pantoprazole (PROTONIX) 40 MG tablet due to GERD, cough and hoarseness. Stop losartan (low BP, fatigue)  12/12/2020 - Laurann Montana, NP - Cardiology - Patient Message - Change: Losartan from 25 mg daily to 12.5 mg daily due to Hypotension. Stop due to patient reporting not taking: Farxiga 5 mg due to side effects of dry mouth and dry eyes.   11/12/2020 - Alvina Filbert, LPN - Cardiology - Patient Message - Stop: Klor Con 20 meq packets due to cost.  Start: potassium chloride SA (KLOR-CON) 20 MEQ tablet - Take 2 tablets (40 mEq total) by mouth daily.  11/10/2020 Elly Modena, LPN - Cardiology - Patient Message - Start: Klor Con 20 meq packets. Dissolve 1 packet in cold water or beverage twice daily after meals. Patient  requested another form of potassium as pills are too hard to take.   10/08/2020 - [place] - Patient presented for difficulty in walking, unspecified atrial fibrillation, chronic combined systolic and diastolic heart failure and displaced bimalleolar fracture of right lower leg. No other information.  10/07/2020 - Melrose Nakayama - Orthopedic Surgery - Patient presented for pain in right shoulder. Procedures: PR BETAMETHASONE ACET&SOD PHOSP, CHG X-RAY ANKLE 3+ VW and PR DRAIN/INJECT LARGE JOINT/BURSA. No other information.  10/01/2020 - [place] - Patient presented for difficulty in walking. No other information.  09/21/2020 - Alvina Filbert, LPN - Patient Message - Advised to take Torsemide daily for 3 days only them resume PRN for weight gain.   Hospital visits: Medication Reconciliation was completed by comparing discharge summary, patients EMR and Pharmacy list, and upon discussion with patient.   Admitted to the hospital on 09/22/2020 due to Ankle surgery s/p fracture. Discharge date was 09/24/2020. Discharged from [place]?Medications Started at [facility] Discharge:?? -started methocarbamol 500 MG tablet PRN -started tramadol 50 mg PRN    Medications that remain the same after Hospital Discharge:??  -All other medications will remain the same.     Admission 06/23/20-06/25/20: acute HF exacerbation - 10 lb wt gain after knee surgery. ECHO EF 25-30%; diuresed 4.5 L. Started Farxiga 10 mg daily, Start Losartan 25 mg, plan to transition to Entresto and spironolactone.  Admission 06/16/20-06/17/20: knee surgery repair.  Objective:  Lab Results  Component Value Date   CREATININE 0.72 11/10/2020   BUN 14 11/10/2020   GFR 63.27 10/15/2020   GFRNONAA >60 09/22/2020   GFRAA 83 05/28/2020   NA 135 11/10/2020   K 4.9 11/10/2020   CALCIUM 9.5 11/10/2020   CO2 25 11/10/2020   GLUCOSE 82 11/10/2020    Lab Results  Component  Value Date/Time   HGBA1C 5.4 09/22/2020 11:10 AM   HGBA1C 5.4 06/24/2020 12:19 AM   GFR 63.27 10/15/2020 01:24 PM   GFR 66.35 04/30/2020 12:40 PM    Last diabetic Eye exam: No results found for: HMDIABEYEEXA  Last diabetic Foot exam: No results found for: HMDIABFOOTEX   Lab Results  Component Value Date   CHOL 261 (H) 12/27/2018   HDL 32.20 (L) 12/27/2018   LDLCALC 73 02/05/2018   LDLDIRECT 58.0 12/27/2018   TRIG (H) 12/27/2018    424.0 Triglyceride is over 400; calculations on Lipids are invalid.   CHOLHDL 8 12/27/2018    Hepatic Function Latest Ref Rng & Units 06/24/2020 04/30/2020 10/29/2019  Total Protein 6.5 - 8.1 g/dL 5.8(L) 6.5 6.5  Albumin 3.5 - 5.0 g/dL 3.1(L) 3.9 3.8  AST 15 - 41 U/L _0 ALT 0 - 44 U/L _1 Alk Phosphatase 38 - 126 U/L 73 84 85  Total Bilirubin 0.3 - 1.2 mg/dL 1.6(H) 0.8 0.5  Bilirubin, Direct 0.0 - 0.2 mg/dL 0.5(H) - -    Lab Results  Component Value Date/Time   TSH 4.356 06/24/2020 12:19 AM   TSH 2.53 10/29/2019 12:36 PM   TSH 4.77 (H) 09/10/2019 12:29 PM    CBC Latest Ref Rng & Units 10/15/2020 09/22/2020 06/23/2020  WBC 4.0 - 10.5 K/uL 8.5 7.3 8.9  Hemoglobin 12.0 - 15.0 g/dL 13.0 12.1 14.0  Hematocrit 36.0 - 46.0 % 39.8 37.6 42.7  Platelets 150.0 - 400.0 K/uL 274.0 169 322    Lab Results  Component Value Date/Time   VD25OH 36.99 03/28/2019 12:25 PM   VD25OH 14.37 (L) 12/27/2018 04:57 PM    Clinical ASCVD: Yes  The ASCVD Risk score (Arnett DK, et al., 2019) failed to calculate for the following reasons:   The 2019 ASCVD risk score is only valid for ages 67 to 47   The patient has a prior MI or stroke diagnosis    Depression screen [facility] 2/9 01/11/2019  Decreased Interest 3  Down, Depressed, Hopeless 3  PHQ - 2 Score 6  Some recent data might be hidden     CHA2DS2-VASc Score = 6  The patient's score is based upon: CHF History: 1 HTN History: 1 Diabetes History: 0 Stroke History: 0 Vascular Disease History: 1 Age Score:  2 Gender Score: 1       Social History   Tobacco Use  Smoking Status Never  Smokeless Tobacco Never   BP Readings from Last 3 Encounters:  03/26/21 122/70  03/10/21 110/60  03/09/21 104/68   Pulse Readings from Last 3 Encounters:  03/26/21 (!) 112  03/10/21 87  03/09/21 (!) 43   Wt Readings from Last 3 Encounters:  03/26/21 149 lb (67.6 kg)  03/10/21 146 lb (66.2 kg)  03/09/21 145 lb (65.8 kg)   BMI Readings from Last 3 Encounters:  03/26/21 29.10 kg/m  03/10/21 24.30 kg/m  03/09/21 24.13 kg/m    Assessment/Interventions: Review of patient past medical history, allergies, medications, health status, including review of consultants reports, laboratory and other test data, was performed as part of comprehensive evaluation and provision of chronic care management services.   SDOH:  (Social Determinants of Health) assessments and interventions performed: Yes  SDOH Screenings   Alcohol Screen: Low Risk    Last Alcohol Screening Score (AUDIT): 0  Depression (PHQ2-9): Not on file  Financial Resource Strain: Low Risk    Difficulty of Paying Living Expenses: Not hard at all  Food Insecurity: No Food Insecurity   Worried About Charity fundraiser in the Last Year: Never true   Ran Out of Food in the Last Year: Never true  Housing: Low Risk    Last Housing Risk Score: 0  Physical Activity: Inactive   Days of Exercise per Week: 0 days   Minutes of Exercise per Session: 0 min  Social Connections: Not on file  Stress: Not on file  Tobacco Use: Low Risk    Smoking Tobacco Use: Never   Smokeless Tobacco Use: Never   Passive Exposure: Not on file  Transportation Needs: No Transportation Needs   Lack of Transportation (Medical): No   Lack of Transportation (Non-Medical): No    CCM Care Plan  Allergies  Allergen Reactions   Ace Inhibitors Cough   Warfarin Sodium Other (See Comments)    DOSE RELATED PHARMACOLOGIC EFFECT "bleed out"   Famotidine Other (See  Comments)    GI upset   Codeine Other (See Comments)    sedation   Delsym [Dextromethorphan Polistirex Er] Other (See Comments)    dizziness   Lactose Intolerance (Gi) Diarrhea   Lasix [Furosemide] Diarrhea    Oral Lasix gives her diarrhea, tolerates IV Rx   Phenylephrine Palpitations and Other (See Comments)    Nasal spray- "likely increase in nasal congestion".  Sulfamethoxazole-Trimethoprim Nausea And Vomiting    GI intolerance.    Medications Reviewed Today     Reviewed by Eugenia Pancoast, FNP (Family Nurse Practitioner) on 03/29/21 at 1049  Med List Status: <None>   Medication Order Taking? Sig Documenting Provider Last Dose Status Informant  acetaminophen (TYLENOL) 500 MG tablet [id number] Yes Take 1,000 mg by mouth in the morning and at bedtime. [provider] Taking Active Self  amoxicillin-clavulanate (AUGMENTIN) 875-125 MG tablet [id number] Yes Take 1 tablet by mouth 2 (two) times daily. Eugenia Pancoast, FNP  Active   budesonide (ENTOCORT EC) 3 MG 24 hr capsule [id number] Yes Take 3 capsules (9 mg total) by mouth daily for 42 days, THEN 2 capsules (6 mg total) daily for 14 days, THEN 1 capsule (3 mg total) daily for 14 days. Levin Erp, [place] Taking Active   buPROPion ER [facility] SR) 150 MG 12 hr tablet [id number]  Take 1 tablet (150 mg total) by mouth daily. [name], MD  Active   calcium carbonate (TUMS - DOSED IN MG ELEMENTAL CALCIUM) 500 MG chewable tablet [id number] Yes Chew 500 mg by mouth 2 (two) times daily as needed for indigestion or heartburn. [provider] Taking Active Self  cholecalciferol (VITAMIN D3) 25 MCG (1000 UNIT) tablet [id number] Yes Take 1,000 Units by mouth in the morning. [provider] Taking Active Self  cholestyramine (QUESTRAN) 4 g packet [id number] Yes TAKE 1 PACKET (4 G TOTAL) BY MOUTH DAILY WITH SUPPER. Gatha Mayer, MD Taking Active   digoxin (LANOXIN) 0.125 MG tablet [id number] Yes TAKE 1 TABLET BY  MOUTH EVERY DAY Bhagat, Bhavinkumar, PA Taking Active   ELIQUIS 2.5 MG TABS tablet [id number] Yes TAKE 1 TABLET BY MOUTH TWICE A DAY [name], MD Taking Active   loperamide (IMODIUM) 2 MG capsule [id number] Yes Take 2 mg by mouth 3 (three) times daily as needed for diarrhea or loose stools.  [provider] Taking Active Self  Menthol-Methyl Salicylate [facility] PAIN RELIEF PATCH EX) [id number] Yes Apply 2 patches topically daily as needed (pain). [provider] Taking Active Self  metoprolol succinate (TOPROL-XL) 100 MG 24 hr tablet [id number] Yes Take 1 tablet (100 mg total) by mouth daily. Take with or immediately following a meal. [name], MD Taking Active Self  oxybutynin (DITROPAN-XL) 5 MG 24 hr tablet [id number]  Take 1 tablet (5 mg total) by mouth at bedtime. [name], MD  Active   pantoprazole (PROTONIX) 40 MG tablet [id number] Yes Take 1 tablet (40 mg total) by mouth daily. Croitoru, Mihai, MD Taking Active   Polyethyl Glycol-Propyl Glycol (SYSTANE OP) [id number] Yes Place 1-2 drops into the left eye 3 (three) times daily as needed (dryness). [provider] Taking Active Self  potassium chloride SA (KLOR-CON) 20 MEQ tablet [id number] Yes Take 2 tablets (40 mEq total) by mouth daily. [name], MD Taking Active   sertraline (ZOLOFT) 50 MG tablet [id number] Yes Take 2 tablets (100 mg total) by mouth daily. [name], MD Taking Active   torsemide [facility]) 20 MG tablet [id number] Yes Take 1 tablet (20 mg total) by mouth daily as needed. if 3 pound weight gain overnight or 5 pounds in a week. 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 12/03/2019   Left knee pain 12/03/2019   Moderate to severe pulmonary hypertension ([place]) 11/26/2019   Tremor of both hands 09/29/2019   Dysuria 08/06/2019   Severe tricuspid regurgitation    S/P hip hemiarthroplasty 08/04/2019   Ear symptom 12/30/2018   Chronic diarrhea 09/07/2017   Hoarseness of voice 09/07/2017   Total knee replacement status, left 04/25/17 05/06/2017   Primary osteoarthritis of left knee 04/25/2017   DJD (degenerative joint disease) 04/25/2017   Healthcare maintenance 02/08/2017   Vitamin D deficiency 02/08/2017   Microscopic colitis 09/27/2016   IBS (irritable bowel syndrome) 08/31/2016   At risk for falling 01/07/2016   Chronic combined systolic and diastolic heart failure (HCC)    Rectus sheath hematoma- no anticoagulation    Atrial fibrillation with RVR- CHADs VASc=4 06/18/2015   Diarrhea 06/12/2015   Advance care planning 10/23/2014   Leg edema 06/27/2012   Medicare annual wellness visit, subsequent 03/27/2012   Depression 03/05/2012   EDEMA 06/07/2010   Osteoporosis 05/23/2010   KNEE PAIN, LEFT 03/02/2010   LBBB (left bundle branch block) 10/28/2008   POST MI SEPTAL DEFECT 06/24/2008   Cough 06/24/2008   Nonischemic cardiomyopathy ([place]) 05/30/2008   ATRIAL FIBRILLATION 05/30/2008   HYPONATREMIA, HX OF 05/30/2008    Immunization History  Administered Date(s) Administered   Fluad Quad(high Dose 65+) 12/14/2018, 01/23/2020   Influenza Whole 12/10/2009, 01/01/2012   Influenza,inj,Quad PF,6+ Mos 12/30/2013, 01/08/2015, 12/30/2015, 12/30/2016, 01/04/2018   PFIZER(Purple Top)SARS-COV-2 Vaccination 05/04/2019, 05/25/2019, 01/12/2020   Pneumococcal Conjugate-13 10/23/2014   Pneumococcal Polysaccharide-23 04/11/2006   Td 05/13/2010   Zoster Recombinat (Shingrix) 01/24/2019, 04/03/2019   Zoster, Live 04/11/2006     Conditions to be addressed/monitored:  Hypertension, Hyperlipidemia, Atrial Fibrillation, Heart Failure, Coronary Artery Disease, Depression, Osteoporosis, and Overactive Bladder  Care Plan : [place]  Updates made by Charlton Haws, [place] since 04/20/2021 12:00 AM     Problem: Hypertension, Hyperlipidemia, Atrial Fibrillation, Heart Failure, Coronary Artery Disease, Depression, Osteoporosis, and Overactive Bladder   Priority: High     Long-Range Goal: Disease mgmt   Start Date: 03/24/2021  Expected End Date: 03/24/2022  This Visit's Progress: On track  Recent Progress: On track  Priority: High  Note:   Current Barriers:  Unable to independently monitor therapeutic efficacy Unable to achieve control of OAB, IBS-D   Pharmacist Clinical Goal(s):  Patient will achieve adherence to monitoring guidelines and medication adherence to achieve therapeutic efficacy achieve improvement in OAB, IBS-D as evidenced by patient report through collaboration with PharmD and provider.   Interventions: 1:1 collaboration with [name], MD regarding development and update of comprehensive plan of care as evidenced by provider attestation and co-signature Inter-disciplinary care team collaboration (see longitudinal plan of care) Comprehensive medication review performed; medication list updated in electronic medical record  Hyperlipidemia: (LDL goal < 70) -Controlled - LDL was at goal (58) but last checked 12/2018; TRIG elevated at that time but likely not fasting given late afternoon lab time (16:57) -Hx MI. Noted statin intolerance in chart. -Current treatment: Cholestyramine 4 g daily PM (for diarrhea) - Appropriate, Effective, Safe, Accessible -Educated on Cholesterol goals;  -Recommend repeat lipid panel at next PCP visit  Persistent Atrial Fibrillation (Goal: prevent stroke and major bleeding) -Controlled -  pt endorses compliance with medications and denies  issues; digoxin level ordered by cardiologist for next PCP visit - needs to be prior to daily dose of digoxin. -CHADSVASC: 6; chronic LBBB; hx of falls -Age 85, wt 65 kg, Cr 0.72 -Current treatment: Metoprolol succinate 100 mg daily -Appropriate, Effective, Safe, Accessible Digoxin 0.125 mg - 1/2 tab daily - Appropriate, Effective, Query Safe, Accessible Eliquis 2.5 mg BID - Appropriate, Effective, Safe, Accessible -Medications previously tried: amiodarone -Counseled on increased risk of stroke due to Afib and benefits of anticoagulation for stroke prevention; importance of adherence to anticoagulant exactly as prescribed; bleeding risk associated with Eliquis and importance of self-monitoring for signs/symptoms of bleeding; avoidance of NSAIDs due to increased bleeding risk with anticoagulants; -Recommended to continue current medication - PCP f/u 1/24 for labs including digoxin level; advised pt not to take digoxin morning of appt  Heart Failure / Hypertension (Goal: BP < 140/90, prevent exacerbations) -Controlled - pt is weighing every day, she takes torsemide when weight increased 2+ lbs - last dose was 2-3 weeks ago; she is still taking potassium daily; K was 4.9 when last checked 11/10/20 -Last ejection fraction: 25-30% (Date: 06/2020) -HF type: Combined Systolic and Diastolic -NYHA Class: I (no actitivty limitation) -Current home BP/HR readings: 113-136/73-80; HR 70-90 -Current treatment: Metoprolol succinate 100 mg daily - Appropriate, Effective, Safe, Accessible Digoxin 0.125 mg - 1/2 tab daily - Appropriate, Effective, Query Safe, Accessible Torsemide 20 mg daily PRN - Appropriate, Effective, Safe, Accessible Potassium chloride 20 mEq - 1 tab daily - Appropriate, Effective, Query Safe, Accessible -Medications previously tried: [place] (weakness), Ace-inhibitor (cough), losartan (low BP) ; cardiologist advised avoid Entresto, spironolactone due to low BP -Educated on BP goals and  benefits of medications for prevention of heart attack, stroke and kidney damage;Importance of home blood pressure monitoring; -Discussed potassium is usually only necessary when taking torsemide - she is at risk for hyperkalemia given torsemide use only every 2-3 weeks -Recommend to continue current medication - PCP f/u 1/24 for labs  Depression (Goal: manage symptoms) -Improving- pt reports mild improvement in mood after switching bupropion from SR to XL and increasing to 150 mg/day -Pt has been on sertraline for ~10 years and worries it is "wearing off"; she started bupropion earlier this year and noticed some improvement -Current treatment: Sertraline 50 mg - 2 tab daily - Appropriate, Effective, Safe, Accessible Bupropion XL 150 mg daily - Appropriate, Effective, Safe, Accessible -PHQ2: 6 - mild depression -Connected with PCP for mental health support -Recommend to continue current medication;  may consider titrating up to 300 mg in future if needed  Osteoporosis (Goal prevent fractures) -Not ideally controlled - pt has no DEXA on file and is not taking medication to improve bone density -Dx from chart review 2012. Hx hip fracture and multiple falls. Pt has declined follow up DEXA scans previously -Current treatment  Vitamin D 1000 IU daily -Assessed her history of hip fracture - appears to be fragility fracture, which qualifies her for treatment of osteoporosis; given significant GI and reflux issues would avoid oral bisphosphonates; she is a good candidate for Prolia or zoledronic acid injections -Recommend [phone] units of vitamin D daily. Recommend 1200 mg of calcium daily from dietary and supplemental sources. Recommend weight-bearing and muscle strengthening exercises for building and maintaining bone density. -Recommended DEXA scan to evaluate bone density - pt declined -Consider starting Prolia injections given hx of hip fracture, hx multiple falls   IBS-D / Lymphocytic Colitis  (Goal: manage symtoms) -  Improving - pt reports improved in diarrhea on budesonide -colitis flare as of 03/09/21; follows with GI -Current treatment  Budesonide EC 9 mg x 6 weeks, 6 mg x 2 weeks, 3 mg x 2 weeks (from 03/09/21 - ) - Appropriate, Effective, Safe, Accessible Cholestyramine 4 g daily - Appropriate, Effective, Safe, Accessible Loperamide 2 mg PRN -takes 2-3 per day - Appropriate, Effective, Safe, Accessible -Counseled on purpose of budesonide given LC flare -Advised pt to contact GI for further management  OAB (Goal: reduce urinary frequency) -Improved - pt reports improvement in urinary frequency/emptying of bladder since starting oxybutynin -Current treatment  Oxybutynin ER 5 mg daily -Medications previously tried: Myrbetriq -Recommend to continue current medication  Health Maintenance -Vaccine gaps: Covid booster, TDAP  Patient Goals/Self-Care Activities Patient will:  - take medications as prescribed as evidenced by patient report and record review focus on medication adherence by pill box check blood pressure periodically, document, and provide at future appointments weigh daily, and contact provider if weight gain of 5+ lbs in a week -Keep PCP appt 1/24. Do not digoxin morning of appt.       Medication Assistance: None required.  Patient affirms current coverage meets needs.  Compliance/Adherence/Medication fill history: Care Gaps: None  Star-Rating Drugs: None  Patient's preferred pharmacy is:  [facility], [place] - [address], [address], [place] [address] Phone: [phone] Fax: [phone]  [id number] [id number]: [phone]: [phone]  Uses pill box? Yes Pt endorses 100% compliance  We discussed: Benefits of medication synchronization, packaging and delivery as well as enhanced pharmacist oversight  with [employer]. Patient decided to: Continue current medication management strategy  Care Plan and Follow Up Patient Decision:  Patient agrees to Care Plan and Follow-up.  Plan: Telephone follow up appointment with care management team member scheduled for:  1 month  LCharlene Brooke PharmD, BEleanor Slater HospitalClinical Pharmacist LTropicPrimary Care at [facility]ood[facility] [phone]

## 2021-04-20 NOTE — Patient Instructions (Signed)
Visit Information  Phone number for Pharmacist: [phone]   Goals Addressed   None     Care Plan : CCM Pharmacy Care Plan  Updates made by Kathyrn Sheriff, RPH since 04/20/2021 12:00 AM     Problem: Hypertension, Hyperlipidemia, Atrial Fibrillation, Heart Failure, Coronary Artery Disease, Depression, Osteoporosis, and Overactive Bladder   Priority: High     Long-Range Goal: Disease mgmt   Start Date: 03/24/2021  Expected End Date: 03/24/2022  This Visit's Progress: On track  Recent Progress: On track  Priority: High  Note:   Current Barriers:  Unable to independently monitor therapeutic efficacy Unable to achieve control of OAB, IBS-D   Pharmacist Clinical Goal(s):  Patient will achieve adherence to monitoring guidelines and medication adherence to achieve therapeutic efficacy achieve improvement in OAB, IBS-D as evidenced by patient report through collaboration with PharmD and provider.   Interventions: 1:1 collaboration with [name], MD regarding development and update of comprehensive plan of care as evidenced by provider attestation and co-signature Inter-disciplinary care team collaboration (see longitudinal plan of care) Comprehensive medication review performed; medication list updated in electronic medical record  Hyperlipidemia: (LDL goal < 70) -Controlled - LDL was at goal (58) but last checked 12/2018; TRIG elevated at that time but likely not fasting given late afternoon lab time (16:57) -Hx MI. Noted statin intolerance in chart. -Current treatment: Cholestyramine 4 g daily PM (for diarrhea) - Appropriate, Effective, Safe, Accessible -Educated on Cholesterol goals;  -Recommend repeat lipid panel at next PCP visit  Persistent Atrial Fibrillation (Goal: prevent stroke and major bleeding) -Controlled - pt endorses compliance with medications and denies issues; digoxin level ordered by cardiologist for next PCP visit - needs to be prior to daily  dose of digoxin. -CHADSVASC: 6; chronic LBBB; hx of falls -Age 87, wt 65 kg, Cr 0.72 -Current treatment: Metoprolol succinate 100 mg daily -Appropriate, Effective, Safe, Accessible Digoxin 0.125 mg - 1/2 tab daily - Appropriate, Effective, Query Safe, Accessible Eliquis 2.5 mg BID - Appropriate, Effective, Safe, Accessible -Medications previously tried: amiodarone -Counseled on increased risk of stroke due to Afib and benefits of anticoagulation for stroke prevention; importance of adherence to anticoagulant exactly as prescribed; bleeding risk associated with Eliquis and importance of self-monitoring for signs/symptoms of bleeding; avoidance of NSAIDs due to increased bleeding risk with anticoagulants; -Recommended to continue current medication - PCP f/u 1/24 for labs including digoxin level; advised pt not to take digoxin morning of appt  Heart Failure / Hypertension (Goal: BP < 140/90, prevent exacerbations) -Controlled - pt is weighing every day, she takes torsemide when weight increased 2+ lbs - last dose was 2-3 weeks ago; she is still taking potassium daily; K was 4.9 when last checked 11/10/20 -Last ejection fraction: 25-30% (Date: 06/2020) -HF type: Combined Systolic and Diastolic -NYHA Class: I (no actitivty limitation) -Current home BP/HR readings: 113-136/73-80; HR 70-90 -Current treatment: Metoprolol succinate 100 mg daily - Appropriate, Effective, Safe, Accessible Digoxin 0.125 mg - 1/2 tab daily - Appropriate, Effective, Query Safe, Accessible Torsemide 20 mg daily PRN - Appropriate, Effective, Safe, Accessible Potassium chloride 20 mEq - 1 tab daily - Appropriate, Effective, Query Safe, Accessible -Medications previously tried: [place] (weakness), Ace-inhibitor (cough), losartan (low BP) ; cardiologist advised avoid Entresto, spironolactone due to low BP -Educated on BP goals and benefits of medications for prevention of heart attack, stroke and kidney damage;Importance of home  blood pressure monitoring; -Discussed potassium is usually only necessary when taking torsemide - she is at risk for  hyperkalemia given torsemide use only every 2-3 weeks -Recommend to continue current medication - PCP f/u 1/24 for labs  Depression (Goal: manage symptoms) -Improving- pt reports mild improvement in mood after switching bupropion from SR to XL and increasing to 150 mg/day -Pt has been on sertraline for ~10 years and worries it is "wearing off"; she started bupropion earlier this year and noticed some improvement -Current treatment: Sertraline 50 mg - 2 tab daily - Appropriate, Effective, Safe, Accessible Bupropion XL 150 mg daily - Appropriate, Effective, Safe, Accessible -PHQ2: 6 - mild depression -Connected with PCP for mental health support -Recommend to continue current medication;  may consider titrating up to 300 mg in future if needed  Osteoporosis (Goal prevent fractures) -Not ideally controlled - pt has no DEXA on file and is not taking medication to improve bone density -Dx from chart review 2012. Hx hip fracture and multiple falls. Pt has declined follow up DEXA scans previously -Current treatment  Vitamin D 1000 IU daily -Assessed her history of hip fracture - appears to be fragility fracture, which qualifies her for treatment of osteoporosis; given significant GI and reflux issues would avoid oral bisphosphonates; she is a good candidate for Prolia or zoledronic acid injections -Recommend [phone] units of vitamin D daily. Recommend 1200 mg of calcium daily from dietary and supplemental sources. Recommend weight-bearing and muscle strengthening exercises for building and maintaining bone density. -Recommended DEXA scan to evaluate bone density - pt declined -Consider starting Prolia injections given hx of hip fracture, hx multiple falls - will readdress at follow up  IBS-D / Lymphocytic Colitis (Goal: manage symtoms) -Improving - pt reports improved in diarrhea on  budesonide -colitis flare as of 03/09/21; follows with GI -Current treatment  Budesonide EC 9 mg x 6 weeks, 6 mg x 2 weeks, 3 mg x 2 weeks (from 03/09/21 - ) - Appropriate, Effective, Safe, Accessible Cholestyramine 4 g daily - Appropriate, Effective, Safe, Accessible Loperamide 2 mg PRN -takes 2-3 per day - Appropriate, Effective, Safe, Accessible -Counseled on purpose of budesonide given LC flare -Advised pt to contact GI for further management  OAB (Goal: reduce urinary frequency) -Improved - pt reports improvement in urinary frequency/emptying of bladder since starting oxybutynin -Current treatment  Oxybutynin ER 5 mg daily -Medications previously tried: Myrbetriq -Recommend to continue current medication  Health Maintenance -Vaccine gaps: Covid booster, TDAP  Patient Goals/Self-Care Activities Patient will:  - take medications as prescribed as evidenced by patient report and record review focus on medication adherence by pill box check blood pressure periodically, document, and provide at future appointments weigh daily, and contact provider if weight gain of 5+ lbs in a week -Keep PCP appt 1/24. Do not digoxin morning of appt.      Patient verbalizes understanding of instructions provided today and agrees to view in [place].  Telephone follow up appointment with pharmacy team member scheduled for: 2 months  Charlene Brooke, PharmD, [facility] Clinical Pharmacist [facility] [phone]

## 2021-04-22 DIAGNOSIS — H52203 Unspecified astigmatism, bilateral: Secondary | ICD-10-CM | POA: Diagnosis not present

## 2021-04-22 DIAGNOSIS — H1789 Other corneal scars and opacities: Secondary | ICD-10-CM | POA: Diagnosis not present

## 2021-04-22 DIAGNOSIS — H35373 Puckering of macula, bilateral: Secondary | ICD-10-CM | POA: Diagnosis not present

## 2021-04-27 ENCOUNTER — Other Ambulatory Visit: Payer: Self-pay | Admitting: Family Medicine

## 2021-04-27 MED ORDER — SERTRALINE HCL 50 MG PO TABS: 100.0000 mg | ORAL_TABLET | Freq: Every day | ORAL | 0 refills | Status: DC

## 2021-05-04 ENCOUNTER — Encounter: Payer: Self-pay | Admitting: Family Medicine

## 2021-05-04 ENCOUNTER — Other Ambulatory Visit: Payer: Self-pay

## 2021-05-04 ENCOUNTER — Ambulatory Visit (INDEPENDENT_AMBULATORY_CARE_PROVIDER_SITE_OTHER): Payer: Medicare Other | Admitting: Family Medicine

## 2021-05-04 VITALS — BP 128/82 | HR 75 | Temp 97.3°F | Ht 60.0 in | Wt 143.0 lb

## 2021-05-04 DIAGNOSIS — R490 Dysphonia: Secondary | ICD-10-CM | POA: Diagnosis not present

## 2021-05-04 DIAGNOSIS — F339 Major depressive disorder, recurrent, unspecified: Secondary | ICD-10-CM

## 2021-05-04 DIAGNOSIS — I4891 Unspecified atrial fibrillation: Secondary | ICD-10-CM

## 2021-05-04 LAB — BASIC METABOLIC PANEL
BUN: 17 mg/dL (ref 6–23)
CO2: 26 mEq/L (ref 19–32)
Calcium: 9.2 mg/dL (ref 8.4–10.5)
Chloride: 96 mEq/L (ref 96–112)
Creatinine, Ser: 0.78 mg/dL (ref 0.40–1.20)
GFR: 67.91 mL/min (ref >60.00)
Glucose, Bld: 86 mg/dL (ref 70–99)
Potassium: 4.8 mEq/L (ref 3.5–5.1)
Sodium: 130 mEq/L — ABNORMAL LOW (ref 135–145)

## 2021-05-04 LAB — LIPID PANEL
Cholesterol: 144 mg/dL (ref 0–200)
HDL: 60.5 mg/dL (ref >39.00)
LDL Cholesterol: 54 mg/dL (ref 0–99)
NonHDL: 83.02
Total CHOL/HDL Ratio: 2
Triglycerides: 143 mg/dL (ref 0.0–149.0)
VLDL: 28.6 mg/dL (ref 0.0–40.0)

## 2021-05-04 LAB — CBC WITH DIFFERENTIAL/PLATELET
Basophils Absolute: 0 10*3/uL (ref 0.0–0.1)
Basophils Relative: 0.6 % (ref 0.0–3.0)
Eosinophils Absolute: 0.1 10*3/uL (ref 0.0–0.7)
Eosinophils Relative: 1.2 % (ref 0.0–5.0)
HCT: 41.7 % (ref 36.0–46.0)
Hemoglobin: 13.5 g/dL (ref 12.0–15.0)
Lymphocytes Relative: 19.1 % (ref 12.0–46.0)
Lymphs Abs: 1.1 10*3/uL (ref 0.7–4.0)
MCHC: 32.5 g/dL (ref 30.0–36.0)
MCV: 91.6 fl (ref 78.0–100.0)
Monocytes Absolute: 0.5 10*3/uL (ref 0.1–1.0)
Monocytes Relative: 8 % (ref 3.0–12.0)
Neutro Abs: 4.1 10*3/uL (ref 1.4–7.7)
Neutrophils Relative %: 71.1 % (ref 43.0–77.0)
Platelets: 180 10*3/uL (ref 150.0–400.0)
RBC: 4.55 Mil/uL (ref 3.87–5.11)
RDW: 14 % (ref 11.5–15.5)
WBC: 5.8 10*3/uL (ref 4.0–10.5)

## 2021-05-04 LAB — TSH: TSH: 3.65 u[IU]/mL (ref 0.35–5.50)

## 2021-05-04 MED ORDER — DIGOXIN 125 MCG PO TABS: 0.0625 mg | ORAL_TABLET | Freq: Every day | ORAL | Status: DC

## 2021-05-04 MED ORDER — FLUTICASONE PROPIONATE 50 MCG/ACT NA SUSP: 2.0000 | Freq: Every day | NASAL | 6 refills | Status: DC

## 2021-05-04 NOTE — Patient Instructions (Addendum)
Go to the lab on the way out.   If you have mychart we'll likely use that to update you.    Take care.  Glad to see you. Let me see about med options in the meantime.   Try flonase and see if that helps with congestion and voice changes.

## 2021-05-04 NOTE — Progress Notes (Signed)
This visit occurred during the SARS-CoV-2 public health emergency.  Safety protocols were in place, including screening questions prior to the visit, additional usage of staff PPE, and extensive cleaning of exam room while observing appropriate contact time as indicated for disinfecting solutions.  Recheck pulse 75.  H/o A fib noted.  She has not noted heart racing.  Taking 1/2 tab of digoxin with level pending.  See notes on labs.   Home BP checks usually with SBP ~120-130.   Hoarse voice, cough is dry.  Going on for about a few months.  Already on PPI.  She has no diarrhea since starting budesonide taper and taking questran.    She had episodic torsemide use, about once a week previously but then less recently.  Mood d/w pt.  Still on sertraline and wellbutrin at baseline.  Still with some fatigue.  Her mood is lower.  She is not enthused about doing activities for fun.  Meds, vitals, and allergies reviewed.   ROS: Per HPI unless specifically indicated in ROS section   [place]ck supple, no LA Nasal exam stuffy.   OP irritation w/o exudates.   No stridor.  Ctab IRR, not tachy.  Voice slightly hoarse.   Abd soft.  Skin well perfused.  35 minutes were devoted to patient care in this encounter (this includes time spent reviewing the patient's file/history, interviewing and examining the patient, counseling/reviewing plan with patient).

## 2021-05-05 LAB — DIGOXIN LEVEL: Digoxin Level: <0.5 mcg/L — ABNORMAL LOW (ref 0.8–2.0)

## 2021-05-06 NOTE — Assessment & Plan Note (Signed)
Already on PPI.  Would continue.  Would add on Flonase.  She can update me as needed.  Rationale for treatment, GERD versus postnasal drip, discussed with patient.  No stridor.  Okay for outpatient follow-up.

## 2021-05-06 NOTE — Assessment & Plan Note (Signed)
Continue digoxin and metoprolol.  Continue as needed torsemide.  See notes on labs.

## 2021-05-06 NOTE — Assessment & Plan Note (Signed)
Mood is lower.  Still okay for outpatient follow-up.  Continue Wellbutrin and sertraline.  I will consider options in the meantime.  She agrees with plan.

## 2021-05-08 ENCOUNTER — Encounter: Payer: Self-pay | Admitting: Family Medicine

## 2021-05-08 ENCOUNTER — Other Ambulatory Visit: Payer: Self-pay | Admitting: Cardiovascular Disease

## 2021-05-08 DIAGNOSIS — Z5181 Encounter for therapeutic drug level monitoring: Secondary | ICD-10-CM

## 2021-05-10 ENCOUNTER — Other Ambulatory Visit: Payer: Self-pay | Admitting: Family Medicine

## 2021-05-10 ENCOUNTER — Other Ambulatory Visit: Payer: Self-pay

## 2021-05-10 ENCOUNTER — Encounter: Payer: Self-pay | Admitting: Cardiovascular Disease

## 2021-05-10 DIAGNOSIS — Z9181 History of falling: Secondary | ICD-10-CM | POA: Diagnosis not present

## 2021-05-10 DIAGNOSIS — S82841D Displaced bimalleolar fracture of right lower leg, subsequent encounter for closed fracture with routine healing: Secondary | ICD-10-CM | POA: Diagnosis not present

## 2021-05-10 DIAGNOSIS — M810 Age-related osteoporosis without current pathological fracture: Secondary | ICD-10-CM | POA: Diagnosis not present

## 2021-05-10 DIAGNOSIS — M1711 Unilateral primary osteoarthritis, right knee: Secondary | ICD-10-CM | POA: Diagnosis not present

## 2021-05-10 DIAGNOSIS — Z5181 Encounter for therapeutic drug level monitoring: Secondary | ICD-10-CM

## 2021-05-10 DIAGNOSIS — M6281 Muscle weakness (generalized): Secondary | ICD-10-CM | POA: Diagnosis not present

## 2021-05-10 MED ORDER — SERTRALINE HCL 50 MG PO TABS: 150.0000 mg | ORAL_TABLET | Freq: Every day | ORAL | 3 refills | Status: DC

## 2021-05-10 MED ORDER — METOPROLOL SUCCINATE ER 100 MG PO TB24: 100.0000 mg | ORAL_TABLET | Freq: Every day | ORAL | 1 refills | Status: DC

## 2021-05-11 DIAGNOSIS — I4811 Longstanding persistent atrial fibrillation: Secondary | ICD-10-CM

## 2021-05-11 DIAGNOSIS — I5042 Chronic combined systolic (congestive) and diastolic (congestive) heart failure: Secondary | ICD-10-CM | POA: Diagnosis not present

## 2021-05-11 DIAGNOSIS — F339 Major depressive disorder, recurrent, unspecified: Secondary | ICD-10-CM

## 2021-05-11 DIAGNOSIS — M8080XS Other osteoporosis with current pathological fracture, unspecified site, sequela: Secondary | ICD-10-CM

## 2021-05-12 ENCOUNTER — Other Ambulatory Visit: Payer: Self-pay

## 2021-05-12 DIAGNOSIS — I4891 Unspecified atrial fibrillation: Secondary | ICD-10-CM

## 2021-05-12 MED ORDER — APIXABAN 2.5 MG PO TABS: 2.5000 mg | ORAL_TABLET | Freq: Two times a day (BID) | ORAL | 3 refills | Status: DC

## 2021-05-12 NOTE — Telephone Encounter (Signed)
Patient is requesting Eliquis to be filled .  Message sent  - Pharmacy - anticoag department .

## 2021-05-31 ENCOUNTER — Telehealth: Payer: Self-pay

## 2021-05-31 NOTE — Progress Notes (Signed)
Chronic Care Management Pharmacy Assistant   Name: [name]  MRN: [id number] DOB: 10-16-1930  Reason for Encounter: CCM (General Adherence)   Recent office visits:  05/04/2021 - Crawford Givens, MD - Patient presented for recurrent major depressive disorder. Labs: CBC, TSH, BMP, Digoxin level and Lipid panel. Start: fluticasone (FLONASE) 50 MCG/ACT nasal spray - 2 sprays each nare daily. Change: digoxin (LANOXIN) 0.125 MG tablet - take 0.5 mg by mouth daily.   Recent consult visits:  None since last CCM contact  Hospital visits:  None in previous 6 months  Medications: Outpatient Encounter Medications as of 05/31/2021  Medication Sig   acetaminophen (TYLENOL) 500 MG tablet Take 1,000 mg by mouth in the morning and at bedtime.   apixaban (ELIQUIS) 2.5 MG TABS tablet Take 1 tablet (2.5 mg total) by mouth 2 (two) times daily.   buPROPion ER (WELLBUTRIN SR) 150 MG 12 hr tablet Take 1 tablet (150 mg total) by mouth daily.   calcium carbonate (TUMS - DOSED IN MG ELEMENTAL CALCIUM) 500 MG chewable tablet Chew 500 mg by mouth 2 (two) times daily as needed for indigestion or heartburn.   cholecalciferol (VITAMIN D3) 25 MCG (1000 UNIT) tablet Take 1,000 Units by mouth in the morning.   cholestyramine (QUESTRAN) 4 g packet TAKE 1 PACKET (4 G TOTAL) BY MOUTH DAILY WITH SUPPER.   digoxin (LANOXIN) 0.125 MG tablet Take 0.5 tablets (0.0625 mg total) by mouth daily.   fluticasone (FLONASE) 50 MCG/ACT nasal spray Place 2 sprays into both nostrils daily.   loperamide (IMODIUM) 2 MG capsule Take 2 mg by mouth 3 (three) times daily as needed for diarrhea or loose stools.    Menthol-Methyl Salicylate (SALONPAS PAIN RELIEF PATCH EX) Apply 2 patches topically daily as needed (pain).   metoprolol succinate (TOPROL-XL) 100 MG 24 hr tablet Take 1 tablet (100 mg total) by mouth daily. Take with or immediately following a meal.   oxybutynin (DITROPAN-XL) 5 MG 24 hr tablet Take 1 tablet (5 mg total) by  mouth at bedtime.   pantoprazole (PROTONIX) 40 MG tablet Take 1 tablet (40 mg total) by mouth daily.   Polyethyl Glycol-Propyl Glycol (SYSTANE OP) Place 1-2 drops into the left eye 3 (three) times daily as needed (dryness).   potassium chloride SA (KLOR-CON) 20 MEQ tablet Take 2 tablets (40 mEq total) by mouth daily.   sertraline (ZOLOFT) 50 MG tablet Take 3 tablets (150 mg total) by mouth daily.   torsemide (DEMADEX) 20 MG tablet Take 1 tablet (20 mg total) by mouth daily as needed. if 3 pound weight gain overnight or 5 pounds in a week.   No facility-administered encounter medications on file as of 05/31/2021.   Contacted Rolla Servidio Hegner on 05/31/2021 for general disease state and medication adherence call.   Star Medications: Medication Name/mg Last Fill Days Supply No star rating drugs noted  What concerns do you have about your medications? Patient does not have any concerns.  The patient denies side effects with their medications.   How often do you forget or accidentally miss a dose? Never  Do you use a pillbox? Yes  Are you having any problems getting your medications from your pharmacy? No  Has the cost of your medications been a concern? No  Since last visit with CPP, the following interventions have been made.  Start: fluticasone (FLONASE) 50 MCG/ACT nasal spray - 2 sprays each nare daily.  Change: digoxin (LANOXIN) 0.125 MG tablet - take 0.5 mg  by mouth daily.   The patient has not had an ED visit since last contact.   The patient reports the following problems with their health:  Patient fell again a week ago on 05/23/2021. Patient was sitting on the bed trying to get down (states her bed is very high up) and slid down the side of the bed. Patient hurt her lower back; hurts to walk or turn over. Patient is taking Tylenol 500 mg 1 tablet at a time (3 times a day); Patient has also used some Tramadol from a previous injury (took three of them three nights in a row). Pain  is a 5/10. Pain has not been seen or notified any provider of her fall.  Patient is very hoarse; can not get it cleared up. Nose spray (Flonase) is helping a lot with sleeping and drainage, but patient still gets a strangling cough and she is very hoarse. Patient has been loosing her voice as well. No pain is associated with the cough.   Patient denies concerns or questions for Phil Dopp, PharmD at this time.   Care Gaps: Annual wellness visit in last year? No Most Recent BP reading: 128/82 on 05/04/2021  Upcoming appointments: CCM appointment on 06/16/2021   Al Corpus, CPP notified  Claudina Lick, [place] Clinical Pharmacy Assistant [phone]  Time Spent: 40 Minutes

## 2021-06-08 DIAGNOSIS — S82841D Displaced bimalleolar fracture of right lower leg, subsequent encounter for closed fracture with routine healing: Secondary | ICD-10-CM | POA: Diagnosis not present

## 2021-06-08 DIAGNOSIS — M810 Age-related osteoporosis without current pathological fracture: Secondary | ICD-10-CM | POA: Diagnosis not present

## 2021-06-08 DIAGNOSIS — M6281 Muscle weakness (generalized): Secondary | ICD-10-CM | POA: Diagnosis not present

## 2021-06-08 DIAGNOSIS — Z9181 History of falling: Secondary | ICD-10-CM | POA: Diagnosis not present

## 2021-06-08 DIAGNOSIS — M1711 Unilateral primary osteoarthritis, right knee: Secondary | ICD-10-CM | POA: Diagnosis not present

## 2021-06-11 ENCOUNTER — Telehealth: Payer: Self-pay

## 2021-06-11 NOTE — Progress Notes (Signed)
? ? ?  Chronic Care Management ?Pharmacy Assistant  ? ?Name: [name]  MRN: [id number] DOB: Sep 07, 1930 ? ?Reason for Encounter: CCM (Reschedule CCM Appointment) ?  ?Due to patient having an appointment with Dr. Para March on 06/18/2021 the CCM appointment on 06/16/2021 will be rescheduled to an appointment to a date following Dr. Para March. Patient is now scheduled for a telephone visit on 07/08/2021 at 11:45 am. Patient understood and put the new date on her calendar.  ? ?Al Corpus, CPP notified ? ?Claudina Lick, RMA ?Clinical Pharmacy Assistant ?[phone] ? ?Time Spent: 10 Minutes ? ?

## 2021-06-16 ENCOUNTER — Telehealth: Payer: Medicare Other

## 2021-06-18 ENCOUNTER — Other Ambulatory Visit: Payer: Self-pay

## 2021-06-18 ENCOUNTER — Ambulatory Visit (INDEPENDENT_AMBULATORY_CARE_PROVIDER_SITE_OTHER): Payer: Medicare Other | Admitting: Family Medicine

## 2021-06-18 ENCOUNTER — Encounter: Payer: Self-pay | Admitting: Family Medicine

## 2021-06-18 DIAGNOSIS — F32A Depression, unspecified: Secondary | ICD-10-CM | POA: Diagnosis not present

## 2021-06-18 NOTE — Patient Instructions (Signed)
Keep using your walking and let me see about options in the meantime.  Take care.  Glad to see you.

## 2021-06-18 NOTE — Progress Notes (Unsigned)
This visit occurred during the SARS-CoV-2 public health emergency.  Safety protocols were in place, including screening questions prior to the visit, additional usage of staff PPE, and extensive cleaning of exam room while observing appropriate contact time as indicated for disinfecting solutions.  She is off wellbutrin and didn't see any change on/off med.  Still on 150mg  sertraline.    "I don't feel good, I'm tired and sleepy."  Feel 3 weeks ago, using walker now.    She had noted a scratchy throat and some cough.  Some post nasal gtt.  She has throat irritation.    Still anticoagulated w/o bleeding noted.  Sleeping w/o snoring.    She is considering leaving her house of 52 years and moving to her daughter's house.    Change sertraline to another med?

## 2021-06-23 IMAGING — RF DG ANKLE 2V *R*
1 series · 2 of 2 positions shown · non-contrast
Comparison: None.

CLINICAL DATA: Intraoperative imaging for fixation of right ankle
fractures.

EXAM:
RIGHT ANKLE - 2 VIEW; DG C-ARM 1-60 MIN-NO REPORT

[Series 1: unknown protocol · 0.14mm/px · 2 of 2 slices shown]
[im 1/2]
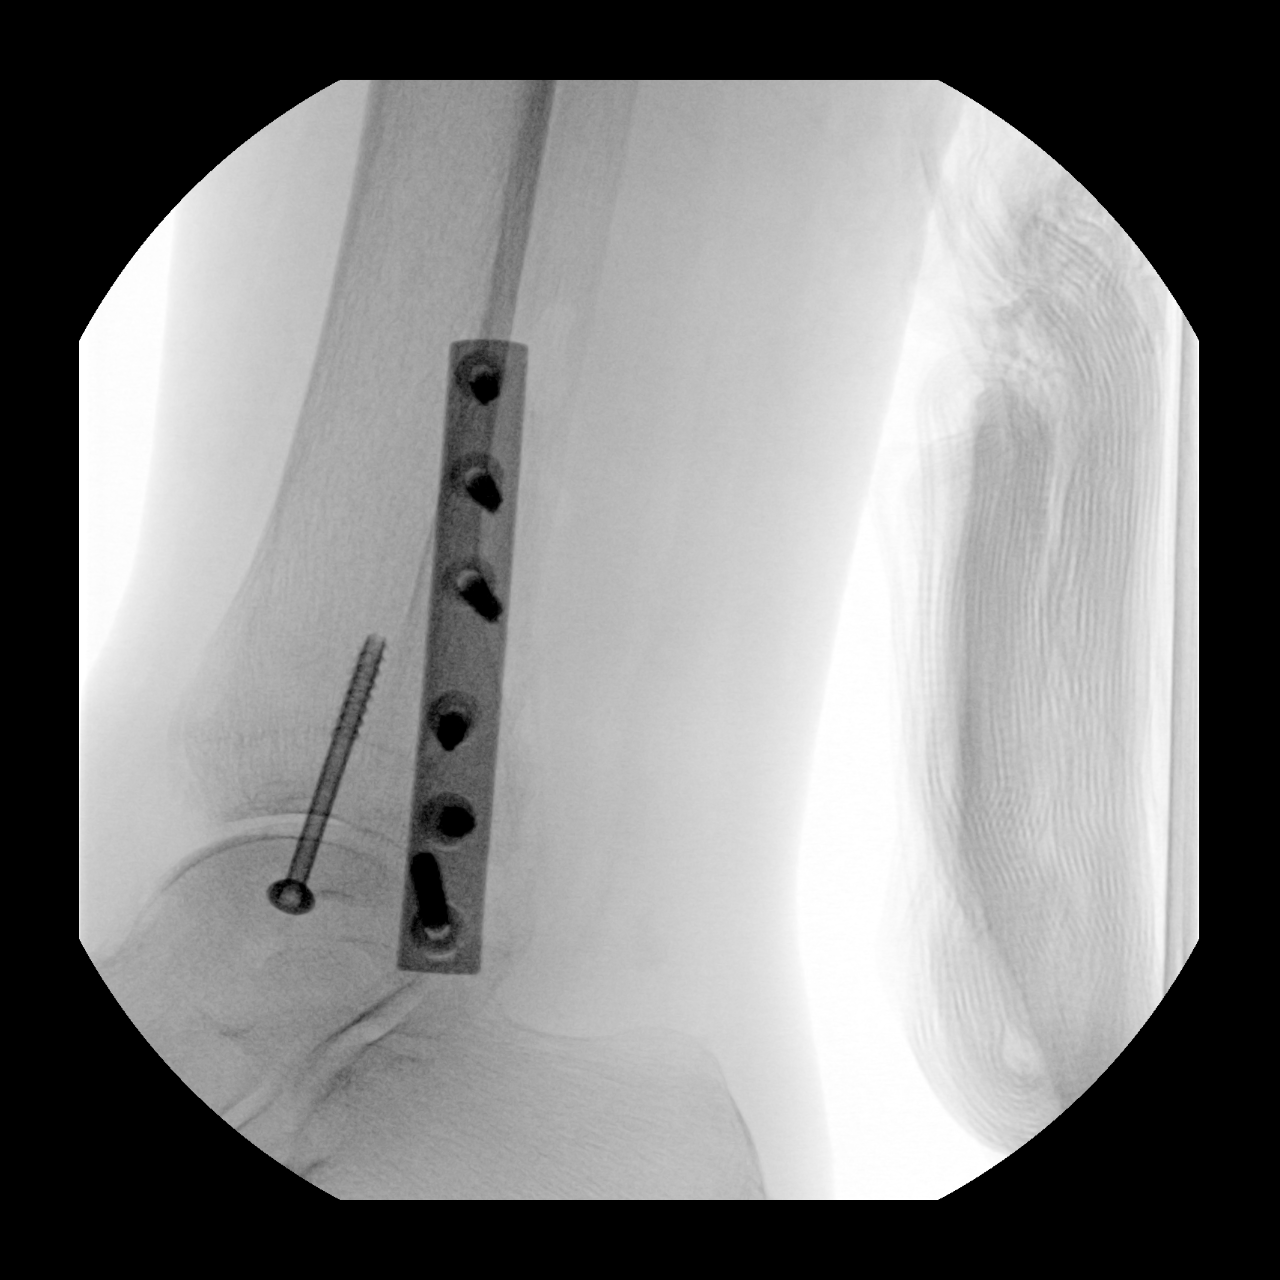
[im 2/2]
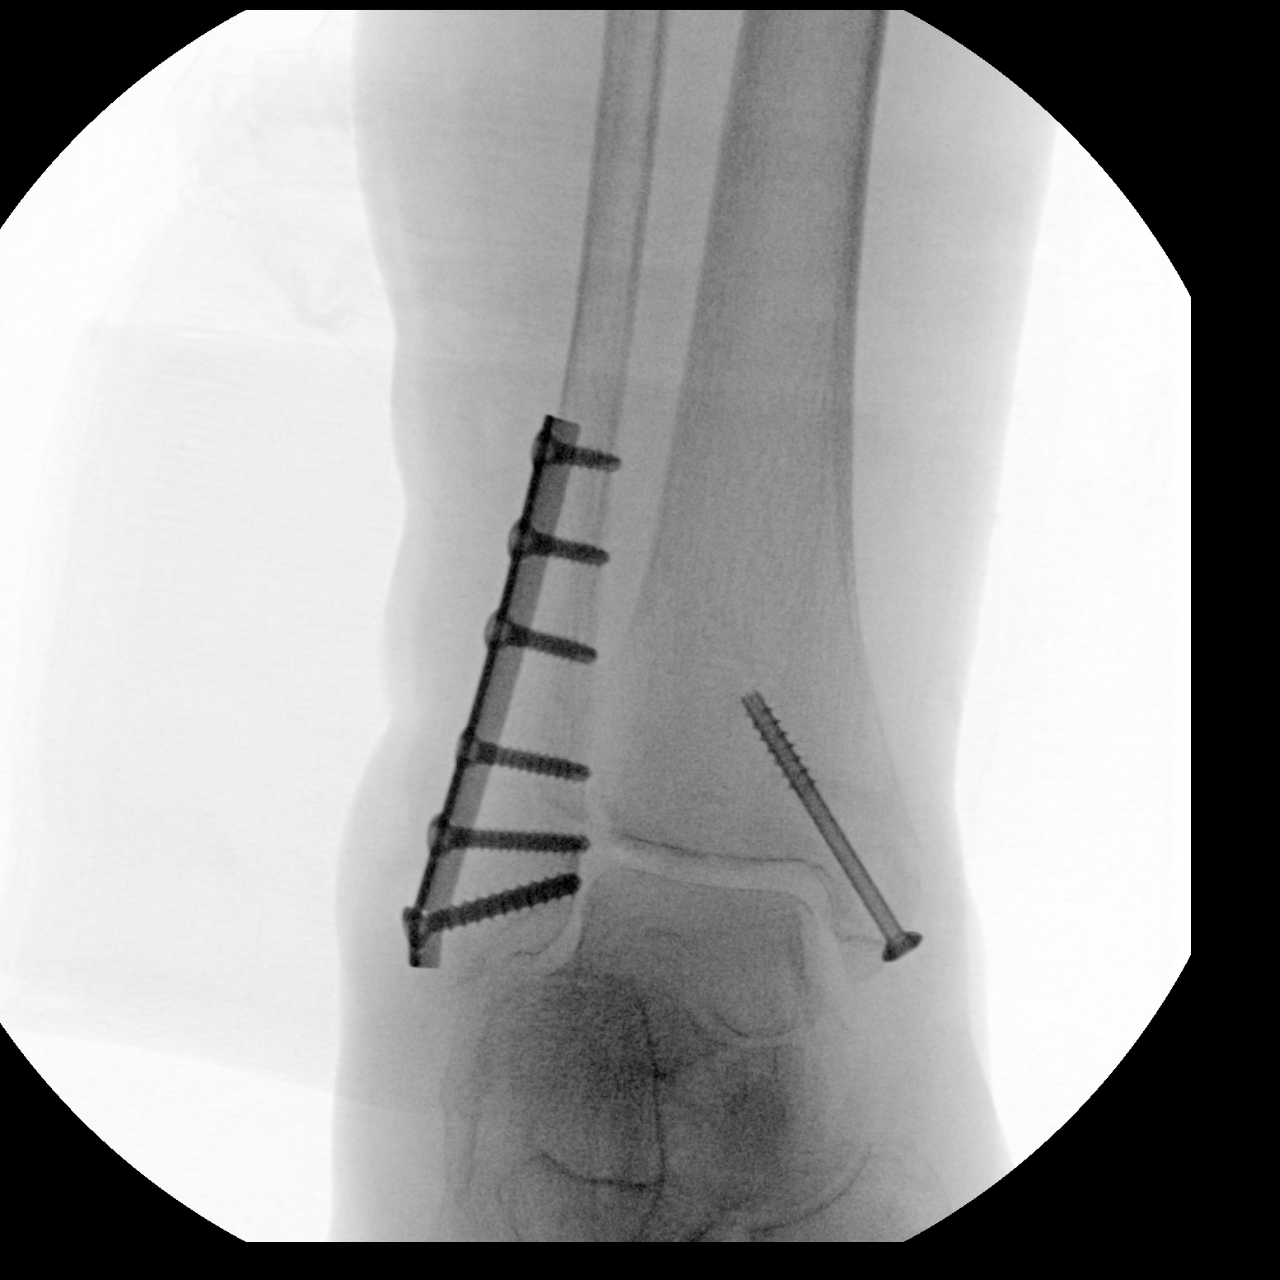

[2 of 2 positions shown; findings below may reference images not displayed]

FINDINGS: 2 fluoroscopic spot views demonstrate plate and screws in place
across a distal fibular fracture and a single screw in place for
fixation of a medial malleolar fracture. Position and alignment
appear anatomic. Hardware is intact. No acute finding.
IMPRESSION: Intraoperative imaging for fixation of ankle fractures. No acute
finding.

## 2021-06-23 IMAGING — RF DG C-ARM 1-60 MIN-NO REPORT
1 series · 2 of 2 positions shown · non-contrast
Comparison: None.

CLINICAL DATA: Intraoperative imaging for fixation of right ankle
fractures.

EXAM:
RIGHT ANKLE - 2 VIEW; DG C-ARM 1-60 MIN-NO REPORT

[Series 1: unknown protocol · 0.14mm/px · 2 of 2 slices shown]
[im 1/2]
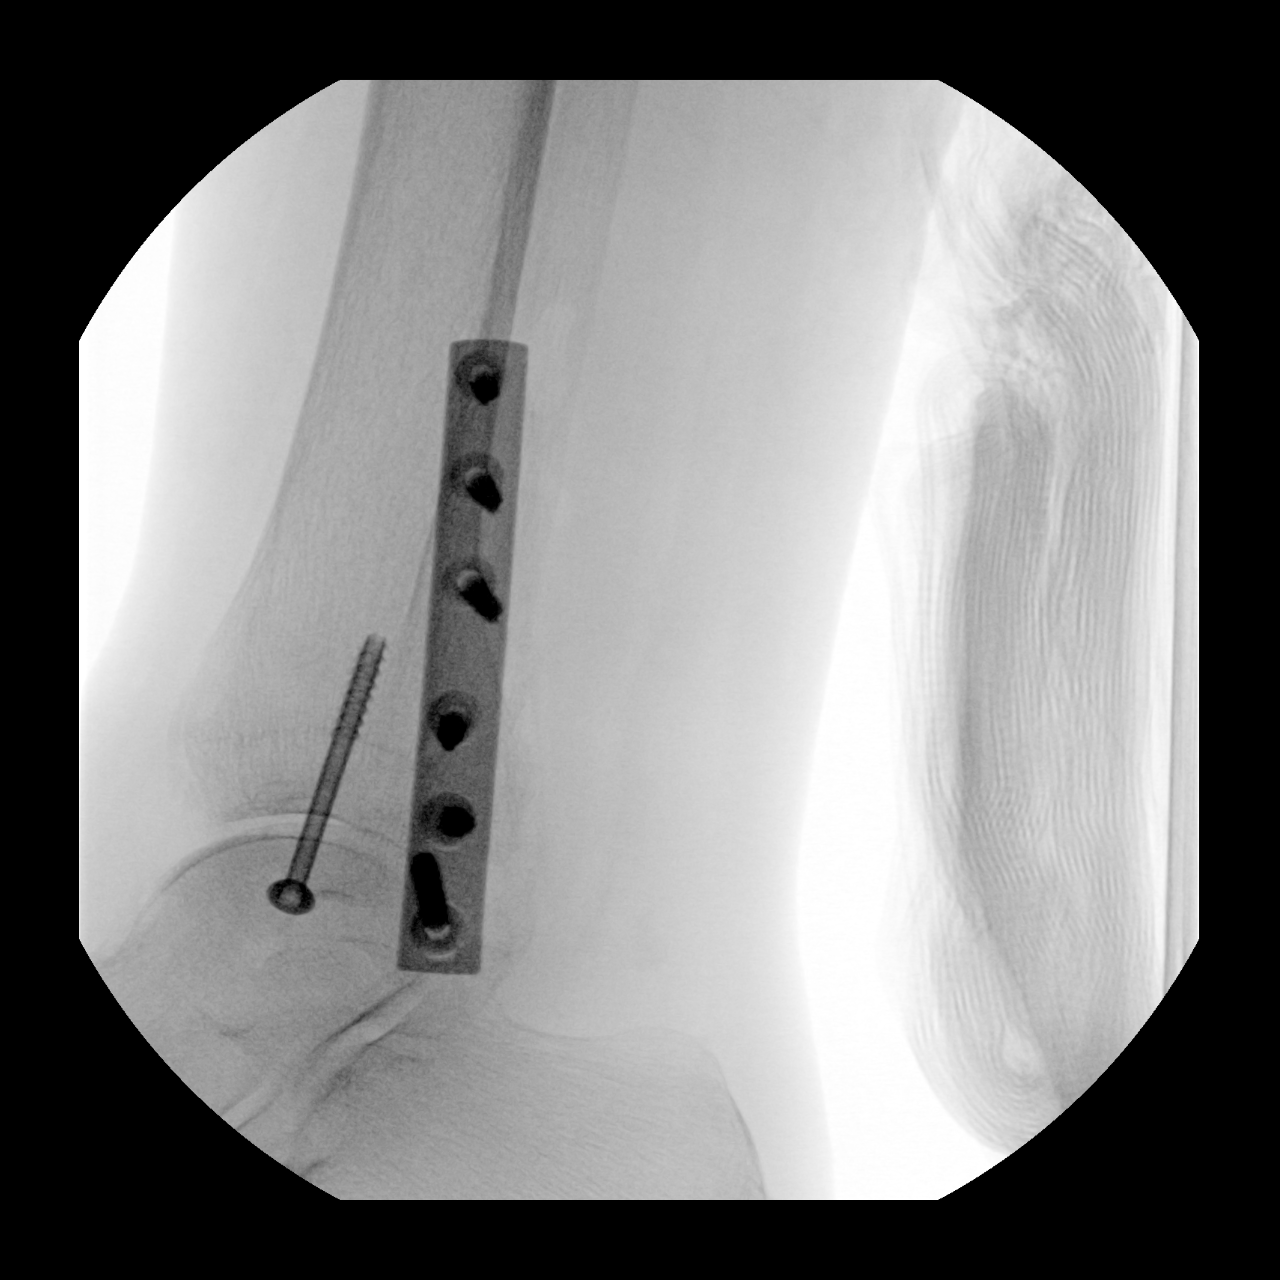
[im 2/2]
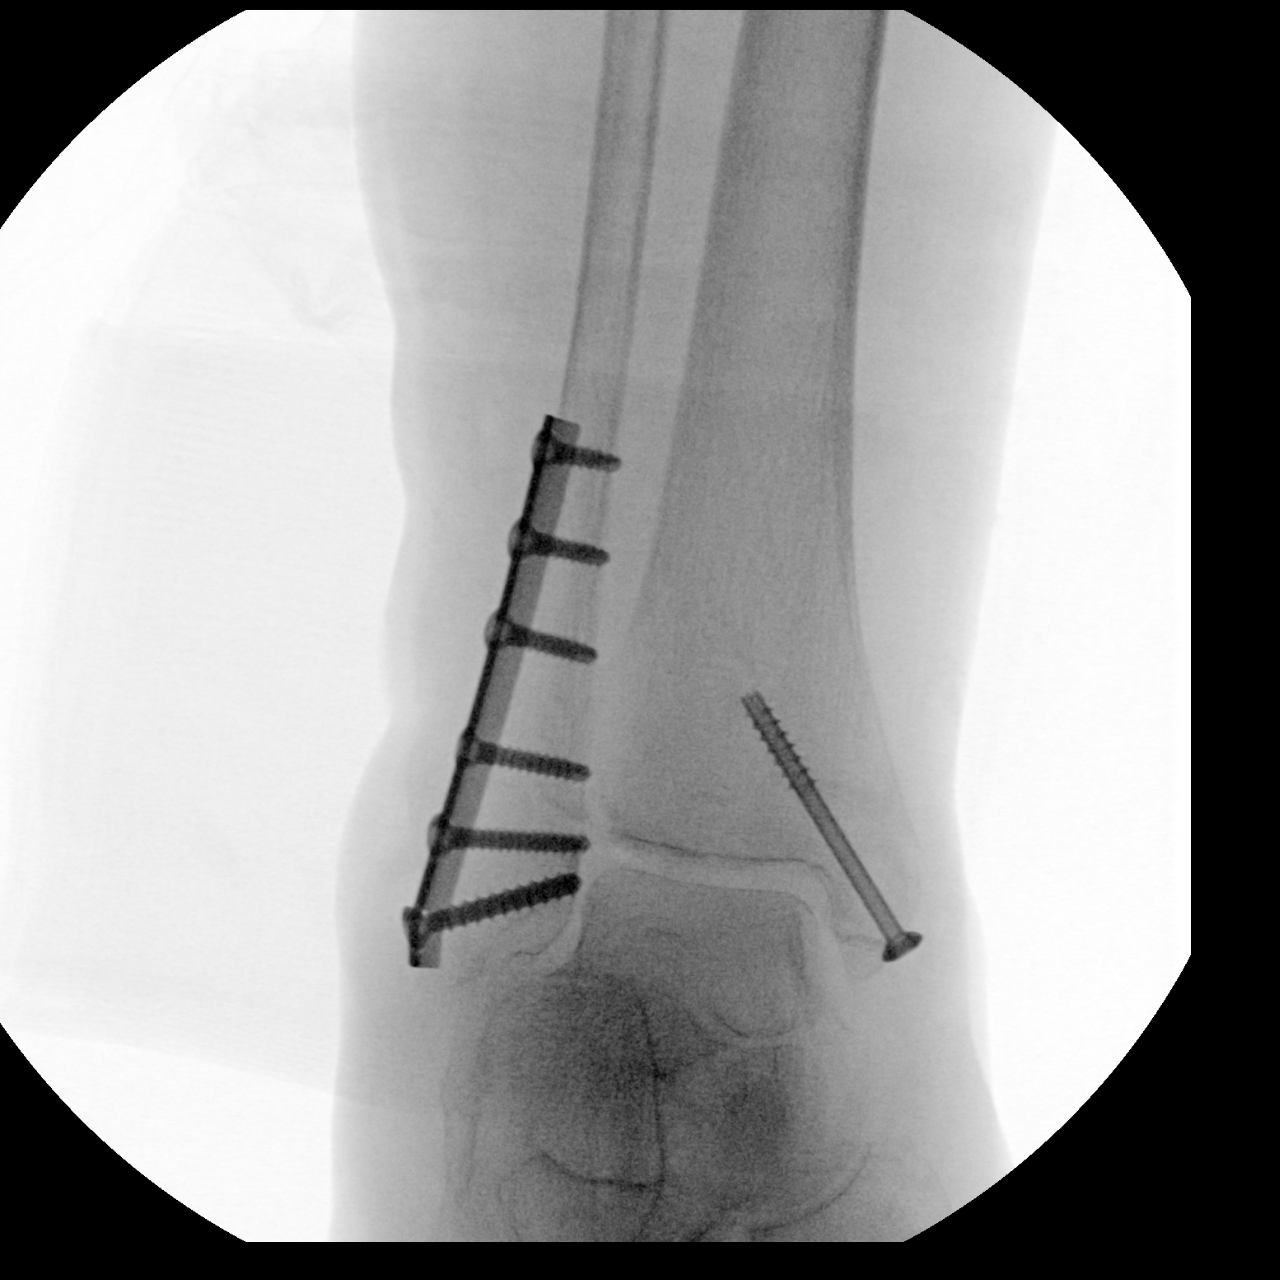

[2 of 2 positions shown; findings below may reference images not displayed]

FINDINGS: 2 fluoroscopic spot views demonstrate plate and screws in place
across a distal fibular fracture and a single screw in place for
fixation of a medial malleolar fracture. Position and alignment
appear anatomic. Hardware is intact. No acute finding.
IMPRESSION: Intraoperative imaging for fixation of ankle fractures. No acute
finding.

## 2021-06-24 ENCOUNTER — Other Ambulatory Visit: Payer: Self-pay | Admitting: Family Medicine

## 2021-06-24 ENCOUNTER — Encounter: Payer: Self-pay | Admitting: Family Medicine

## 2021-06-24 MED ORDER — VENLAFAXINE HCL ER 37.5 MG PO CP24: 37.5000 mg | ORAL_CAPSULE | Freq: Every day | ORAL | Status: DC

## 2021-06-24 MED ORDER — VENLAFAXINE HCL ER 37.5 MG PO CP24: 37.5000 mg | ORAL_CAPSULE | Freq: Every day | ORAL | 1 refills | Status: DC

## 2021-06-24 NOTE — Progress Notes (Signed)
Patient sent mychart message about medications. I have sent this message back to patient for further instructions.  ?

## 2021-06-24 NOTE — Assessment & Plan Note (Signed)
I need to consider options in the meantime.  Discussed with patient.  She is no staff Wellbutrin but continue sertraline for now.  We talked about her other chronic medical conditions and making sure that any med change options would not interfere with her current treatment i.e. with Eliquis etc.  She agrees with plan.  Still okay for outpatient follow-up.  Fall cautions discussed with patient.  She is trying to adjust to all the considerations about moving out of her house and into her daughter's home. ?

## 2021-06-24 NOTE — Progress Notes (Signed)
Please call pt.  I would slowly change from sertraline to effexor.  I sent the new rx but don't start it yet.   ?Would taper sertraline.  Take 2 pills a day for 2 weeks. Then 1 pill a day for 2 weeks.  Then change to effexor, 1 tab a day.  Please let me know how that goes about 2 weeks after the change to effexor, sooner if needed.  Thanks.   ?

## 2021-07-04 ENCOUNTER — Other Ambulatory Visit: Payer: Self-pay | Admitting: Internal Medicine

## 2021-07-05 ENCOUNTER — Telehealth: Payer: Self-pay

## 2021-07-05 NOTE — Progress Notes (Signed)
? ? ?  Chronic Care Management ?Pharmacy Assistant  ? ?Name: [name]  MRN: [id number] DOB: 1930/10/24 ? ?Reason for Encounter: CCM Animator) ?  ?Medications: ?Outpatient Encounter Medications as of 07/05/2021  ?Medication Sig  ? acetaminophen (TYLENOL) 500 MG tablet Take 1,000 mg by mouth in the morning and at bedtime.  ? apixaban (ELIQUIS) 2.5 MG TABS tablet Take 1 tablet (2.5 mg total) by mouth 2 (two) times daily.  ? calcium carbonate (TUMS - DOSED IN MG ELEMENTAL CALCIUM) 500 MG chewable tablet Chew 500 mg by mouth 2 (two) times daily as needed for indigestion or heartburn.  ? cholecalciferol (VITAMIN D3) 25 MCG (1000 UNIT) tablet Take 1,000 Units by mouth in the morning.  ? cholestyramine (QUESTRAN) 4 g packet TAKE 1 PACKET (4 G TOTAL) BY MOUTH DAILY WITH SUPPER.  ? digoxin (LANOXIN) 0.125 MG tablet Take 0.5 tablets (0.0625 mg total) by mouth daily.  ? loperamide (IMODIUM) 2 MG capsule Take 2 mg by mouth 3 (three) times daily as needed for diarrhea or loose stools.   ? Menthol-Methyl Salicylate (SALONPAS PAIN RELIEF PATCH EX) Apply 2 patches topically daily as needed (pain).  ? metoprolol succinate (TOPROL-XL) 100 MG 24 hr tablet Take 1 tablet (100 mg total) by mouth daily. Take with or immediately following a meal.  ? oxybutynin (DITROPAN-XL) 5 MG 24 hr tablet Take 1 tablet (5 mg total) by mouth at bedtime.  ? pantoprazole (PROTONIX) 40 MG tablet Take 1 tablet (40 mg total) by mouth daily.  ? Polyethyl Glycol-Propyl Glycol (SYSTANE OP) Place 1-2 drops into the left eye 3 (three) times daily as needed (dryness).  ? potassium chloride SA (KLOR-CON) 20 MEQ tablet Take 2 tablets (40 mEq total) by mouth daily.  ? torsemide (DEMADEX) 20 MG tablet Take 1 tablet (20 mg total) by mouth daily as needed. if 3 pound weight gain overnight or 5 pounds in a week.  ? venlafaxine XR (EFFEXOR XR) 37.5 MG 24 hr capsule Take 1 capsule (37.5 mg total) by mouth daily with breakfast.  ? ?No facility-administered  encounter medications on file as of 07/05/2021.  ? ?[name] was contacted to remind of upcoming telephone visit with Al Corpus on 07/08/2021 at 11:45. Patient was reminded to have any blood glucose and blood pressure readings available for review at appointment.  ? ?Patient confirmed appointment. ? ?Are you having any problems with your medications? No  ? ?Do you have any concerns you like to discuss with the pharmacist? Patient states she is retaining fluid again. She is making sure to take her Eliquis as she should.  ? ?Star Rating Drugs: ?Medication:  Last Fill: Day Supply ?No star rating drugs noted ? ?Al Corpus, CPP notified ? ?Claudina Lick, RMA ?Clinical Pharmacy Assistant ?[phone] ? ? ? ? ?

## 2021-07-08 ENCOUNTER — Ambulatory Visit (INDEPENDENT_AMBULATORY_CARE_PROVIDER_SITE_OTHER): Payer: Medicare Other | Admitting: Pharmacist

## 2021-07-08 DIAGNOSIS — Z9181 History of falling: Secondary | ICD-10-CM | POA: Diagnosis not present

## 2021-07-08 DIAGNOSIS — I4891 Unspecified atrial fibrillation: Secondary | ICD-10-CM

## 2021-07-08 DIAGNOSIS — S82841D Displaced bimalleolar fracture of right lower leg, subsequent encounter for closed fracture with routine healing: Secondary | ICD-10-CM | POA: Diagnosis not present

## 2021-07-08 DIAGNOSIS — F339 Major depressive disorder, recurrent, unspecified: Secondary | ICD-10-CM

## 2021-07-08 DIAGNOSIS — M6281 Muscle weakness (generalized): Secondary | ICD-10-CM | POA: Diagnosis not present

## 2021-07-08 DIAGNOSIS — K580 Irritable bowel syndrome with diarrhea: Secondary | ICD-10-CM

## 2021-07-08 DIAGNOSIS — I5042 Chronic combined systolic (congestive) and diastolic (congestive) heart failure: Secondary | ICD-10-CM

## 2021-07-08 DIAGNOSIS — R32 Unspecified urinary incontinence: Secondary | ICD-10-CM

## 2021-07-08 DIAGNOSIS — M810 Age-related osteoporosis without current pathological fracture: Secondary | ICD-10-CM | POA: Diagnosis not present

## 2021-07-08 NOTE — Patient Instructions (Signed)
Visit Information ? ?Phone number for Pharmacist: [phone] ? ? Goals Addressed   ?None ?  ? ? ?Care Plan : [place]  ?Updates made by Charlton Haws, [place] since 07/08/2021 12:00 AM  ?  ? ?Problem: Hypertension, Hyperlipidemia, Atrial Fibrillation, Heart Failure, Coronary Artery Disease, Depression, Osteoporosis, and Overactive Bladder   ?Priority: High  ?  ? ?Long-Range Goal: Disease mgmt   ?Start Date: 03/24/2021  ?Expected End Date: 03/24/2022  ?This Visit's Progress: On track  ?Recent Progress: On track  ?Priority: High  ?Note:   ?Current Barriers:  ?Unable to independently monitor therapeutic efficacy ?Unable to achieve control of depression ? ?Pharmacist Clinical Goal(s):  ?Patient will achieve adherence to monitoring guidelines and medication adherence to achieve therapeutic efficacy ?achieve improvement in depression as evidenced by patient report through collaboration with PharmD and provider.  ? ?Interventions: ?1:1 collaboration with [name], MD regarding development and update of comprehensive plan of care as evidenced by provider attestation and co-signature ?Inter-disciplinary care team collaboration (see longitudinal plan of care) ?Comprehensive medication review performed; medication list updated in electronic medical record ? ?Hyperlipidemia: (LDL goal < 70) ?-Controlled - LDL 54 (04/2021) at goal; TRIG 143 ?-Hx MI. Noted statin intolerance in chart. ?-Current treatment: ?Cholestyramine 4 g daily PM (for diarrhea) - Appropriate, Effective, Safe, Accessible ?-Educated on Cholesterol goals ?-Recommend to continue current medication ? ?Persistent Atrial Fibrillation (Goal: prevent stroke and major bleeding) ?-Controlled - pt endorses compliance with medications and denies issues; recent digoxin level was not elevated (04/2021) ?-CHADSVASC: 6; chronic LBBB; hx of falls ?-Age 88, wt 65 kg, Cr 0.72 ?-Current treatment: ?Metoprolol succinate 100 mg daily -Appropriate, Effective,  Safe, Accessible ?Digoxin 0.125 mg - 1/2 tab daily - Appropriate, Effective, Safe, Accessible ?Eliquis 2.5 mg BID - Appropriate, Effective, Safe, Accessible ?-Medications previously tried: amiodarone ?-Counseled on increased risk of stroke due to Afib and benefits of anticoagulation for stroke prevention; importance of adherence to anticoagulant exactly as prescribed; bleeding risk associated with Eliquis and importance of self-monitoring for signs/symptoms of bleeding; avoidance of NSAIDs due to increased bleeding risk with anticoagulants; ?-Recommended to continue current medication ? ?Heart Failure / Hypertension (Goal: BP < 140/90, prevent exacerbations) ?-Controlled - pt is weighing every day, she takes torsemide when weight increased 2+ lbs; she reports she has been retaining more fluid lately and has taken torsemide twice this week (usually takes 1 every few weeks), she does not like to take torsemide because it "puts her in the bathroom all day" ?-Pt also endorses occasional dizziness; she is not checking BP at these times so unclear if this is related to hypotension ?-Last ejection fraction: 25-30% (Date: 06/2020) ?-HF type: Combined Systolic and Diastolic; NYHA Class: I (no actitivty limitation) ?-Current home BP/HR readings: 113-136/73-80; HR 70-90 ?-Current treatment: ?Metoprolol succinate 100 mg daily - Appropriate, Effective, Safe, Accessible ?Digoxin 0.125 mg - 1/2 tab daily - Appropriate, Effective, Query Safe, Accessible ?Torsemide 20 mg daily PRN - Appropriate, Effective, Safe, Accessible ?Potassium chloride 20 mEq - 1 tab daily - Appropriate, Effective, Query Safe, Accessible ?-Medications previously tried: [place] (weakness), Ace-inhibitor (cough), losartan (low BP) ; cardiologist advised avoid Entresto, spironolactone due to low BP ?-Educated on BP goals and benefits of medications for prevention of heart attack, stroke and kidney damage;Importance of home blood pressure monitoring; ?-Advised to  take the torsemide for 2+ lb weight gain; advised to limit salt intake ?-Advised to check BP when feeling dizzy; if it is low, contact PCP or cardiologist for  med adjustment ?-Recommend to continue current medication ? ?Depression (Goal: manage symptoms) ?-Not ideally controlled -  pt is in process of tapering off sertraline to start venlafaxine; she wondered about trial of drug-free period - given severity of depression based on recent PHQ-9, drug-free period is not advisable ?-PHQ9: 20 (06/2021) - severe depression ?-Current treatment: ?Sertraline 50 mg - 2 tab daily - Appropriate, Effective, Safe, Accessible ?Venlafaxine 37.5 mg daily (not started yet, tapering sertraline first) ?-Connected with PCP for mental health support ?-Discussed benefits of venlafaxine - may be more activating than sertraline, may also help with pain ?-Recommend to start venlafaxine after competing sertraline taper, as prescribed ? ?Osteoporosis (Goal prevent fractures) ?-Not ideally controlled - pt has no DEXA on file and is not taking medication to improve bone density ?-Dx from chart review 2012. Hx hip fracture and multiple falls. Pt has declined follow up DEXA scans previously ?-Current treatment  ?Vitamin D 1000 IU daily ?-Assessed her history of hip fracture - appears to be fragility fracture, which qualifies her for treatment of osteoporosis; given significant GI and reflux issues would avoid oral bisphosphonates; she is a good candidate for Prolia or zoledronic acid injections ?-Recommend [phone] units of vitamin D daily. Recommend 1200 mg of calcium daily from dietary and supplemental sources. Recommend weight-bearing and muscle strengthening exercises for building and maintaining bone density. ?-Recommended DEXA scan to evaluate bone density - pt declined ?-Consider starting Prolia injections given hx of hip fracture, hx multiple falls  ? ?IBS-D / Lymphocytic Colitis (Goal: manage symtoms) ?-Improving - pt reports improved in  diarrhea on budesonide ?-colitis flare as of 03/09/21; follows with GI ?-Current treatment  ?Cholestyramine 4 g daily - Appropriate, Effective, Safe, Accessible ?Loperamide 2 mg PRN -takes 2-3 per day - Appropriate, Effective, Safe, Accessible ?-Medications previously used: budesonide (flares) ?-Advised pt to contact GI for further management ? ?OAB (Goal: reduce urinary frequency) ?-Improved - pt reports improvement in urinary frequency/emptying of bladder since starting oxybutynin ?-Current treatment  ?Oxybutynin ER 5 mg daily ?-Medications previously tried: Myrbetriq ?-Recommend to continue current medication ? ?Health Maintenance ?-Vaccine gaps: Covid booster, TDAP ?-Hand arthritis - wears brace at night, takes Tylenol ? ?Patient Goals/Self-Care Activities ?Patient will:  ?- take medications as prescribed as evidenced by patient report and record review ?focus on medication adherence by pill box ?check blood pressure when dizzy, document, and provide at future appointments ?weigh daily, and contact provider if weight gain of 5+ lbs in a week ?  ?  ? ?Patient verbalizes understanding of instructions and care plan provided today and agrees to view in [place]. Active MyChart status confirmed with patient.   ?Telephone follow up appointment with pharmacy team member scheduled for: 1 month ? ?Charlene Brooke, PharmD, BCACP ?Clinical Pharmacist ?[facility] ?[phone] ?  ?

## 2021-07-08 NOTE — Progress Notes (Signed)
? ?Chronic Care Management ?Pharmacy Note ? ?07/08/2021 ?Name:  [name] MRN:  [id number] DOB:  07/13/1930 ? ?Summary: CCM F/U visit ?-Pt is tapering off sertraline and planning to start venlafaxine next week. Discussed benefits of venlafaxine. ?-Pt reports she is retaining fluid - she has taken torsemide twice this week, normally only takes it once every 2-3 weeks; discussed limiting salt ?-Pt reports occasional dizziness, she does not check BP at these times ?-Assessed her history of hip fracture - appears to be fragility fracture, which qualifies her for treatment of osteoporosis even without DEXA scan (pt has declined repeat DEXA); given significant GI and reflux issues would avoid oral bisphosphonates; she is a good candidate for Prolia or zoledronic acid injections ? ?Recommendations/Changes made from today's visit: ?-Advised to limit salt to < 2000 mg/day; continue weighing daily and take torsemide for 2+ lb weight gain ?-Advised to check BP when feeling dizzy; if it is low, contact PCP or cardiology for medication adjustments ?-Consider Prolia injections; pt did not agree today but may discuss with PCP ? ?Plan: ?-Pharmacist follow up televisit scheduled for 1 month ? ? ? ?Subjective: ?[name] is an 88 y.o. year old female who is a primary patient of Damita Dunnings, Elveria Rising, MD.  The CCM team was consulted for assistance with disease management and care coordination needs.   ? ?Engaged with patient by telephone for follow up visit in response to provider referral for pharmacy case management and/or care coordination services.  ? ?Consent to Services:  ?The patient was given information about Chronic Care Management services, agreed to services, and gave verbal consent prior to initiation of services.  Please see initial visit note for detailed documentation.  ? ?Patient Care Team: ?[name], MD as PCP - General ?Sanda Klein, MD as PCP - Cardiology (Cardiology) ?Shon Hough, MD as  Consulting Physician (Ophthalmology) ?Eula Listen, DDS as Referring Physician (Dentistry) ?Melrose Nakayama, MD as Consulting Physician (Orthopedic Surgery) ?Sanders Manninen, Cleaster Corin, [facility] as Pharmacist (Pharmacist) ? ?Patient lives at home with her husband. She is currently using a walker to get around after knee and ankle surgeries earlier this year. ? ?Recent office visits: ?06/18/21 Dr Damita Dunnings OV: f/u depression. Pt is now off bupropion, doing ok with sertraline 150 mg but still mood is down, fatigued. Taper sertraline over 4 weeks, then switch to Venlafaxine.  ? ?05/04/21 Dr Damita Dunnings OV: chronic f/u; try Flonase for hoarse voice. Consider med options for depression (on sertraline, bupropion now). Increase sertraline to 3 tablets (150 mg) daily. ? ?03/26/21 Eugenia Pancoast NP OV: UTI. Rx Augmentin x 10 days ? ?01/19/2021 - Elsie Stain, MD - Patient Message - Start Keflex d/t dysuria. ? ?10/15/2020 - Elsie Stain, MD - F/U R ankle fx/SNF palcement. In wheelchair, not weight-baring. Consulted w/ Dr Oval Linsey re: torsemide dosing given inability to weigh at home - advised torsemide twice a week and repeat BMP in a few weeks. Labs: CBC and BMP. Stop due to patient not taking: methocarbamol, prednisone, tramadol ? ?Recent consult visits: ?03/10/2021 - Sanda Klein, MD - Cardiology - F/U Afib, HF. Orders: Digoxin level (with next PCP visit) and EKG. No medication changes. ?  ?03/09/2021 - Ellouise Newer, PA - Gastroenterology - Patient presented for IBS-D, Lymphocytic colitis. Start: Budesonide 9 mg x 6 weeks, then taper down to 6 mg x 2 weeks, then 3 mg x 2 weeks ? ?12/15/2020 - [name], MD - Cardiology - Patient presented for left bundle branch block. Start: pantoprazole (PROTONIX)  40 MG tablet due to GERD, cough and hoarseness. Stop losartan (low BP, fatigue) ? ?12/12/2020 Laurann Montana, NP - Cardiology - Patient Message - Change: Losartan from 25 mg daily to 12.5 mg daily due to Hypotension. Stop due to  patient reporting not taking: Farxiga 5 mg due to side effects of dry mouth and dry eyes.  ? ?11/12/2020 - Alvina Filbert, LPN - Cardiology - Patient Message - Stop: Klor Con 20 meq packets due to cost. Start: potassium chloride SA (KLOR-CON) 20 MEQ tablet - Take 2 tablets (40 mEq total) by mouth daily. ? ?11/10/2020 - Elly Modena, LPN - Cardiology - Patient Message - Start: Klor Con 20 meq packets. Dissolve 1 packet in cold water or beverage twice daily after meals. Patient requested another form of potassium as pills are too hard to take.  ? ?10/08/2020 - [place] - Patient presented for difficulty in walking, unspecified atrial fibrillation, chronic combined systolic and diastolic heart failure and displaced bimalleolar fracture of right lower leg. No other information.  ?10/07/2020 - Melrose Nakayama - Orthopedic Surgery - Patient presented for pain in right shoulder. Procedures: PR BETAMETHASONE ACET&SOD PHOSP, CHG X-RAY ANKLE 3+ VW and PR DRAIN/INJECT LARGE JOINT/BURSA. No other information.  ?10/01/2020 - [place] - Patient presented for difficulty in walking. No other information.  ?09/21/2020 - Alvina Filbert, LPN - Patient Message - Advised to take Torsemide daily for 3 days only them resume PRN for weight gain.  ? ?Hospital visits: ?Medication Reconciliation was completed by comparing discharge summary, patient?s EMR and Pharmacy list, and upon discussion with patient. ?  ?Admitted to the hospital on 09/22/2020 due to Ankle surgery s/p fracture. Discharge date was 09/24/2020. Discharged from [facility].   ?  ?New?Medications Started at [facility] Discharge:?? ?-started methocarbamol 500 MG tablet PRN ?-started tramadol 50 mg PRN  ?  ?Medications that remain the same after Hospital Discharge:??  ?-All other medications will remain the same.   ?  ?Admission 06/23/20-06/25/20: acute HF exacerbation - 10 lb wt gain after knee surgery. ECHO EF 25-30%; diuresed  4.5 L. Started Farxiga 10 mg daily, Start Losartan 25 mg, plan to transition to Entresto and spironolactone. ? ?Admission 06/16/20-06/17/20: knee surgery repair. ? ?Objective: ? ?Lab Results  ?Component Value Date  ? CREATININE 0.78 05/04/2021  ? BUN 17 05/04/2021  ? GFR 67.91 05/04/2021  ? GFRNONAA >60 09/22/2020  ? GFRAA 83 05/28/2020  ? NA 130 (L) 05/04/2021  ? K 4.8 05/04/2021  ? CALCIUM 9.2 05/04/2021  ? CO2 26 05/04/2021  ? GLUCOSE 86 05/04/2021  ? ? ?Lab Results  ?Component Value Date/Time  ? HGBA1C 5.4 09/22/2020 11:10 AM  ? HGBA1C 5.4 06/24/2020 12:19 AM  ? GFR 67.91 05/04/2021 01:30 PM  ? GFR 63.27 10/15/2020 01:24 PM  ?  ?Last diabetic Eye exam: No results found for: HMDIABEYEEXA  ?Last diabetic Foot exam: No results found for: HMDIABFOOTEX  ? ?Lab Results  ?Component Value Date  ? CHOL 144 05/04/2021  ? HDL 60.50 05/04/2021  ? [place] 54 05/04/2021  ? LDLDIRECT 58.0 12/27/2018  ? TRIG 143.0 05/04/2021  ? CHOLHDL 2 05/04/2021  ? ? ? ?  Latest Ref Rng & Units 06/24/2020  ? 12:19 AM 04/30/2020  ? 12:40 PM 10/29/2019  ? 12:36 PM  ?Hepatic Function  ?Total Protein 6.5 - 8.1 g/dL 5.8   6.5   6.5    ?Albumin 3.5 - 5.0 g/dL 3.1   3.9  3.8    ?AST 15 - 41 U/L '27   16   15    ' ?ALT 0 - 44 U/L '19   11   8    ' ?Alk Phosphatase 38 - 126 U/L 73   84   85    ?Total Bilirubin 0.3 - 1.2 mg/dL 1.6   0.8   0.5    ?Bilirubin, Direct 0.0 - 0.2 mg/dL 0.5      ? ? ?Lab Results  ?Component Value Date/Time  ? TSH 3.65 05/04/2021 01:30 PM  ? TSH 4.356 06/24/2020 12:19 AM  ? TSH 2.53 10/29/2019 12:36 PM  ? ? ? ?  Latest Ref Rng & Units 05/04/2021  ?  1:30 PM 10/15/2020  ?  1:24 PM 09/22/2020  ? 11:10 AM  ?CBC  ?WBC 4.0 - 10.5 K/uL 5.8   8.5   7.3    ?Hemoglobin 12.0 - 15.0 g/dL 13.5   13.0   12.1    ?Hematocrit 36.0 - 46.0 % 41.7   39.8   37.6    ?Platelets 150.0 - 400.0 K/uL 180.0   274.0   169    ? ? ?Lab Results  ?Component Value Date/Time  ? VD25OH 36.99 03/28/2019 12:25 PM  ? VD25OH 14.37 (L) 12/27/2018 04:57 PM  ? ? ?Clinical ASCVD: Yes   ?The ASCVD Risk score (Arnett DK, et al., 2019) failed to calculate for the following reasons: ?  The 2019 ASCVD risk score is only valid for ages 20 to 29 ?  The patient has a prior MI or stroke diagnosis

## 2021-07-09 DIAGNOSIS — M810 Age-related osteoporosis without current pathological fracture: Secondary | ICD-10-CM

## 2021-07-09 DIAGNOSIS — F339 Major depressive disorder, recurrent, unspecified: Secondary | ICD-10-CM

## 2021-07-09 DIAGNOSIS — E785 Hyperlipidemia, unspecified: Secondary | ICD-10-CM

## 2021-07-09 DIAGNOSIS — I4891 Unspecified atrial fibrillation: Secondary | ICD-10-CM | POA: Diagnosis not present

## 2021-07-09 DIAGNOSIS — I110 Hypertensive heart disease with heart failure: Secondary | ICD-10-CM

## 2021-07-09 DIAGNOSIS — I5042 Chronic combined systolic (congestive) and diastolic (congestive) heart failure: Secondary | ICD-10-CM

## 2021-07-12 ENCOUNTER — Ambulatory Visit (INDEPENDENT_AMBULATORY_CARE_PROVIDER_SITE_OTHER): Payer: Medicare Other

## 2021-07-12 VITALS — Ht 65.0 in | Wt 143.0 lb

## 2021-07-12 DIAGNOSIS — Z0000 Encounter for general adult medical examination without abnormal findings: Secondary | ICD-10-CM

## 2021-07-12 NOTE — Progress Notes (Signed)
? ?Subjective:  ? [name] is a 88 y.o. female who presents for Medicare Annual (Subsequent) preventive examination. ?Virtual Visit via Telephone Note ? ?I connected with  [name] on 07/12/21 at 12:00 PM EDT by telephone and verified that I am speaking with the correct person using two identifiers. ? ?Location: ?Patient: HOME ?Provider: [facility] ?Persons participating in the virtual visit: patient/Nurse Health Advisor ?  ?I discussed the limitations, risks, security and privacy concerns of performing an evaluation and management service by telephone and the availability of in person appointments. The patient expressed understanding and agreed to proceed. ? ?Interactive audio and video telecommunications were attempted between this nurse and patient, however failed, due to patient having technical difficulties OR patient did not have access to video capability.  We continued and completed visit with audio only. ? ?Some vital signs may be absent or patient reported.  ? ?Darral Dash, LPN ? ?Review of Systems    ? ?Cardiac Risk Factors include: hypertension;sedentary lifestyle;advanced age (>28men, >57 women);Other (see comment), Risk factor comments: AFIB, LBBB, Osteoporosis ? ?   ?Objective:  ?  ?Today's Vitals  ? 07/12/21 1155  ?Weight: 143 lb (64.9 kg)  ?Height: 5\' 5"  (1.651 m)  ? ?Body mass index is 23.8 kg/m?. ? ? ?  07/12/2021  ? 12:10 PM 09/22/2020  ? 11:05 AM 09/21/2020  ?  2:56 PM 06/24/2020  ?  1:29 AM 06/24/2020  ?  1:22 AM 06/16/2020  ?  5:00 PM 06/16/2020  ?  4:20 PM  ?Advanced Directives  ?Does Patient Have a Medical Advance Directive? Yes Yes Yes  Yes  Yes  ?Type of 08/16/2020 of [place];Living will Healthcare Power of [place];Living will Healthcare Power of [place];Living will Living will  Living will   ?Does patient want to make changes to medical advance directive?  No - Patient declined  No - Patient declined No - Patient declined No - Patient declined No -  Patient declined  ?Copy of Healthcare Power of Attorney in Chart? Yes - validated most recent copy scanned in chart (See row information) No - copy requested       ?Would patient like information on creating a medical advance directive? No - Patient declined        ? ? ?Current Medications (verified) ?Outpatient Encounter Medications as of 07/12/2021  ?Medication Sig  ? acetaminophen (TYLENOL) 500 MG tablet Take 1,000 mg by mouth in the morning and at bedtime.  ? apixaban (ELIQUIS) 2.5 MG TABS tablet Take 1 tablet (2.5 mg total) by mouth 2 (two) times daily.  ? buPROPion (WELLBUTRIN SR) 150 MG 12 hr tablet Take 150 mg by mouth daily.  ? calcium carbonate (TUMS - DOSED IN MG ELEMENTAL CALCIUM) 500 MG chewable tablet Chew 500 mg by mouth 2 (two) times daily as needed for indigestion or heartburn.  ? cholecalciferol (VITAMIN D3) 25 MCG (1000 UNIT) tablet Take 1,000 Units by mouth in the morning.  ? cholestyramine (QUESTRAN) 4 g packet TAKE 1 PACKET (4 G TOTAL) BY MOUTH DAILY WITH SUPPER.  ? digoxin (LANOXIN) 0.125 MG tablet Take 0.5 tablets (0.0625 mg total) by mouth daily.  ? loperamide (IMODIUM) 2 MG capsule Take 2 mg by mouth 3 (three) times daily as needed for diarrhea or loose stools.   ? losartan (COZAAR) 25 MG tablet   ? Menthol-Methyl Salicylate (SALONPAS PAIN RELIEF PATCH EX) Apply 2 patches topically daily as needed (pain).  ? metoprolol succinate (TOPROL-XL) 100 MG  24 hr tablet Take 1 tablet (100 mg total) by mouth daily. Take with or immediately following a meal.  ? oxybutynin (DITROPAN-XL) 5 MG 24 hr tablet Take 1 tablet (5 mg total) by mouth at bedtime.  ? pantoprazole (PROTONIX) 40 MG tablet Take 1 tablet (40 mg total) by mouth daily.  ? Polyethyl Glycol-Propyl Glycol (SYSTANE OP) Place 1-2 drops into the left eye 3 (three) times daily as needed (dryness).  ? potassium chloride SA (KLOR-CON) 20 MEQ tablet Take 2 tablets (40 mEq total) by mouth daily.  ? torsemide (DEMADEX) 20 MG tablet Take 1 tablet (20 mg  total) by mouth daily as needed. if 3 pound weight gain overnight or 5 pounds in a week.  ? venlafaxine XR (EFFEXOR XR) 37.5 MG 24 hr capsule Take 1 capsule (37.5 mg total) by mouth daily with breakfast.  ? ?No facility-administered encounter medications on file as of 07/12/2021.  ? ? ?Allergies (verified) ?Ace inhibitors, Warfarin sodium, Famotidine, Codeine, Delsym [dextromethorphan polistirex er], Lactose intolerance (gi), Lasix [furosemide], Phenylephrine, and Sulfamethoxazole-trimethoprim  ? ?History: ?Past Medical History:  ?Diagnosis Date  ? Acute combined systolic and diastolic heart failure (HCC) 05/02/2017  ? Anemia   ? Anxiety   ? Arthritis   ? "knees" (06/18/2015)  ? Atrial fibrillation with RVR (HCC) 06/18/2015  ? h/o sig bleed on coumadin  ? Cardiogenic shock 05/02/2017  ? Complication of anesthesia 2010  ? "w/cardiac cath; got into a psychotic state for ~ 3 days; got me out w/Valium"  ? Depression   ? "I have it off and on; not as often as I've gotten older" (06/18/2015)  ? Diverticulosis   ? descending and sigmoid colon--Dr. Jarold Motto  ? DJD (degenerative joint disease) of knee   ? left  ? Dyspnea   ? with exertion and in morning mostly when wakes up   ? E coli infection   ? History of  ? GERD (gastroesophageal reflux disease)   ? History of blood transfusion   ? History of Clostridium difficile colitis   ? IBS (irritable bowel syndrome)   ? Insomnia   ?  off chronic ambien 5mg  as of 10/11, rare/episodic use since then  DCM/CHF  ? LBBB (left bundle branch block)   ? Lymphocytic colitis 09/27/2016  ? Moderate to severe pulmonary hypertension (HCC) 11/26/2019  ? Nonischemic cardiomyopathy (HCC)   ? normal coronaries, EF 15-20% 04/2008, EF 50-55% 2015  ? Osteoporosis   ? on DXA 05/2010  ? Positive PPD   ? Retroperitoneal bleed   ? Sciatica   ? Right  ? Urinary incontinence   ? Occassionally  ? ?Past Surgical History:  ?Procedure Laterality Date  ? ANTERIOR APPROACH HEMI HIP ARTHROPLASTY Left 08/05/2019  ?  Procedure: ANTERIOR APPROACH HEMI HIP ARTHROPLASTY;  Surgeon: 08/07/2019, MD;  Location: [facility] OR;  Service: Orthopedics;  Laterality: Left;  ? BIOPSY  11/22/2017  ? Procedure: BIOPSY;  Surgeon: 11/24/2017, MD;  Location: [facility];  Service: Endoscopy;;  ? CARDIAC CATHETERIZATION  2010  ? CARDIOVERSION N/A 06/19/2015  ? Procedure: CARDIOVERSION;  Surgeon: 08/19/2015, MD;  Location[facility] ENDOSCOPY;  Service: Cardiovascular;  Laterality: N/A;  ? CATARACT EXTRACTION W/ INTRAOCULAR LENS  IMPLANT, BILATERAL Bilateral   ? COLONOSCOPY WITH PROPOFOL N/A 11/22/2017  ? Procedure: COLONOSCOPY WITH PROPOFOL;  Surgeon: 11/24/2017, MD;  Location: [facility] ENDOSCOPY;  Service: Endoscopy;  Laterality: N/A;  ? DILATION AND CURETTAGE OF UTERUS    ? ORIF ANKLE FRACTURE Right  09/22/2020  ? Procedure: RIGHT ANKLE OPEN REDUCTION INTERNAL FIXATION (ORIF);  Surgeon: [name], MD;  Location: [facility] ORS;  Service: Orthopedics;  Laterality: Right;  ? TEE WITHOUT CARDIOVERSION N/A 06/19/2015  ? Procedure: TRANSESOPHAGEAL ECHOCARDIOGRAM (TEE);  Surgeon: Laurey Morale, MD;  Location[facility] ENDOSCOPY;  Service: Cardiovascular;  Laterality: N/A;  ? TOTAL KNEE ARTHROPLASTY Left 04/25/2017  ? Procedure: TOTAL KNEE ARTHROPLASTY;  Surgeon: [name], MD;  Location: [facility] OR;  Service: Orthopedics;  Laterality: Left;  ? TOTAL KNEE ARTHROPLASTY Right 02/18/2020  ? Procedure: RIGHT TOTAL KNEE ARTHROPLASTY;  Surgeon: [name], MD;  Location: [facility] ORS;  Service: Orthopedics;  Laterality: Right;  ? TOTAL KNEE ARTHROPLASTY Right 06/16/2020  ? Procedure: RIGHT RETINACULAR REPAIR;  Surgeon: [name], MD;  Location: [facility] ORS;  Service: Orthopedics;  Laterality: Right;  ? TOTAL KNEE REVISION Left 12/03/2019  ? Procedure: LEFT TOTAL KNEE REVISION  OF POYLY EXCHANGE;  Surgeon: [name], MD;  Location: [facility] ORS;  Service: Orthopedics;  Laterality: Left;  ? VAGINAL HYSTERECTOMY  1979  ? ovaries intact  ? ?Family History  ?Problem Relation Age of  Onset  ? Colon cancer Father   ?     possible colon cancer  ? Other Father   ?     esophagus tumor?  ? Tuberculosis Mother   ? Breast cancer Neg Hx   ? Stomach cancer Neg Hx   ? Pancreatic cancer Neg Hx   ? ?S

## 2021-07-12 NOTE — Patient Instructions (Signed)
[name] , ?Thank you for taking time to come for your Medicare Wellness Visit. I appreciate your ongoing commitment to your health goals. Please review the following plan we discussed and let me know if I can assist you in the future.  ? ?Screening recommendations/referrals: ?Colonoscopy: No longer required due age.  ?Mammogram: No longer required due age.  ?Bone Density: No longer required due age.  ? ?Recommended yearly ophthalmology/optometry visit for glaucoma screening and checkup ?Recommended yearly dental visit for hygiene and checkup ? ?Vaccinations: ?Influenza vaccine: Done  ?Pneumococcal vaccine: Done 10/23/2014 and 04/11/2006 ?Tdap vaccine: Done 05/13/2010 Repeat in 10 years ? ?Shingles vaccine: Done 04/03/2019, 01/24/2019 and 04/11/2006.   ?Covid-19:Done 01/12/2020, 05/25/2019, 05/04/2019.  ? ?Advanced directives: Copy scanned into chart. ? ?Conditions/risks identified: Aim for 30 minutes of exercise or brisk walking, 6-8 glasses of water, and 5 servings of fruits and vegetables each day. ? ? ?Next appointment: Follow up in one year for your annual wellness visit 2024. ? ? ?Preventive Care 86 Years and Older, Female ?Preventive care refers to lifestyle choices and visits with your health care provider that can promote health and wellness. ?What does preventive care include? ?A yearly physical exam. This is also called an annual well check. ?Dental exams once or twice a year. ?Routine eye exams. Ask your health care provider how often you should have your eyes checked. ?Personal lifestyle choices, including: ?Daily care of your teeth and gums. ?Regular physical activity. ?Eating a healthy diet. ?Avoiding tobacco and drug use. ?Limiting alcohol use. ?Practicing safe sex. ?Taking low-dose aspirin every day. ?Taking vitamin and mineral supplements as recommended by your health care provider. ?What happens during an annual well check? ?The services and screenings done by your health care provider during your annual  well check will depend on your age, overall health, lifestyle risk factors, and family history of disease. ?Counseling  ?Your health care provider may ask you questions about your: ?Alcohol use. ?Tobacco use. ?Drug use. ?Emotional well-being. ?Home and relationship well-being. ?Sexual activity. ?Eating habits. ?History of falls. ?Memory and ability to understand (cognition). ?Work and work Statistician. ?Reproductive health. ?Screening  ?You may have the following tests or measurements: ?Height, weight, and BMI. ?Blood pressure. ?Lipid and cholesterol levels. These may be checked every 5 years, or more frequently if you are over 62 years old. ?Skin check. ?Lung cancer screening. You may have this screening every year starting at age 20 if you have a 30-pack-year history of smoking and currently smoke or have quit within the past 15 years. ?Fecal occult blood test (FOBT) of the stool. You may have this test every year starting at age 32. ?Flexible sigmoidoscopy or colonoscopy. You may have a sigmoidoscopy every 5 years or a colonoscopy every 10 years starting at age 64. ?Hepatitis C blood test. ?Hepatitis B blood test. ?Sexually transmitted disease (STD) testing. ?Diabetes screening. This is done by checking your blood sugar (glucose) after you have not eaten for a while (fasting). You may have this done every 1-3 years. ?Bone density scan. This is done to screen for osteoporosis. You may have this done starting at age 68. ?Mammogram. This may be done every 1-2 years. Talk to your health care provider about how often you should have regular mammograms. ?Talk with your health care provider about your test results, treatment options, and if necessary, the need for more tests. ?Vaccines  ?Your health care provider may recommend certain vaccines, such as: ?Influenza vaccine. This is recommended every year. ?Tetanus, diphtheria,  and acellular pertussis (Tdap, Td) vaccine. You may need a Td booster every 10 years. ?Zoster  vaccine. You may need this after age 22. ?Pneumococcal 13-valent conjugate (PCV13) vaccine. One dose is recommended after age 90. ?Pneumococcal polysaccharide (PPSV23) vaccine. One dose is recommended after age 89. ?Talk to your health care provider about which screenings and vaccines you need and how often you need them. ?This information is not intended to replace advice given to you by your health care provider. Make sure you discuss any questions you have with your health care provider. ?Document Released: 04/24/2015 Document Revised: 12/16/2015 Document Reviewed: 01/27/2015 ?Elsevier Interactive Patient Education ? 2017 [place]. ? ?Fall Prevention in the Home ?Falls can cause injuries. They can happen to people of all ages. There are many things you can do to make your home safe and to help prevent falls. ?What can I do on the outside of my home? ?Regularly fix the edges of walkways and driveways and fix any cracks. ?Remove anything that might make you trip as you walk through a door, such as a raised step or threshold. ?Trim any bushes or trees on the path to your home. ?Use bright outdoor lighting. ?Clear any walking paths of anything that might make someone trip, such as rocks or tools. ?Regularly check to see if handrails are loose or broken. Make sure that both sides of any steps have handrails. ?Any raised decks and porches should have guardrails on the edges. ?Have any leaves, snow, or ice cleared regularly. ?Use sand or salt on walking paths during winter. ?Clean up any spills in your garage right away. This includes oil or grease spills. ?What can I do in the bathroom? ?Use night lights. ?Install grab bars by the toilet and in the tub and shower. Do not use towel bars as grab bars. ?Use non-skid mats or decals in the tub or shower. ?If you need to sit down in the shower, use a plastic, non-slip stool. ?Keep the floor dry. Clean up any water that spills on the floor as soon as it happens. ?Remove  soap buildup in the tub or shower regularly. ?Attach bath mats securely with double-sided non-slip rug tape. ?Do not have throw rugs and other things on the floor that can make you trip. ?What can I do in the bedroom? ?Use night lights. ?Make sure that you have a light by your bed that is easy to reach. ?Do not use any sheets or blankets that are too big for your bed. They should not hang down onto the floor. ?Have a firm chair that has side arms. You can use this for support while you get dressed. ?Do not have throw rugs and other things on the floor that can make you trip. ?What can I do in the kitchen? ?Clean up any spills right away. ?Avoid walking on wet floors. ?Keep items that you use a lot in easy-to-reach places. ?If you need to reach something above you, use a strong step stool that has a grab bar. ?Keep electrical cords out of the way. ?Do not use floor polish or wax that makes floors slippery. If you must use wax, use non-skid floor wax. ?Do not have throw rugs and other things on the floor that can make you trip. ?What can I do with my stairs? ?Do not leave any items on the stairs. ?Make sure that there are handrails on both sides of the stairs and use them. Fix handrails that are broken or loose. Make sure  that handrails are as long as the stairways. ?Check any carpeting to make sure that it is firmly attached to the stairs. Fix any carpet that is loose or worn. ?Avoid having throw rugs at the top or bottom of the stairs. If you do have throw rugs, attach them to the floor with carpet tape. ?Make sure that you have a light switch at the top of the stairs and the bottom of the stairs. If you do not have them, ask someone to add them for you. ?What else can I do to help prevent falls? ?Wear shoes that: ?Do not have high heels. ?Have rubber bottoms. ?Are comfortable and fit you well. ?Are closed at the toe. Do not wear sandals. ?If you use a stepladder: ?Make sure that it is fully opened. Do not climb a  closed stepladder. ?Make sure that both sides of the stepladder are locked into place. ?Ask someone to hold it for you, if possible. ?Clearly mark and make sure that you can see: ?Any grab bars or handr

## 2021-07-15 NOTE — Telephone Encounter (Signed)
Patient seen by Dr Sallyanne Kuster 03/10/2022 ?

## 2021-07-23 DIAGNOSIS — M25511 Pain in right shoulder: Secondary | ICD-10-CM | POA: Diagnosis not present

## 2021-07-23 DIAGNOSIS — M47812 Spondylosis without myelopathy or radiculopathy, cervical region: Secondary | ICD-10-CM | POA: Diagnosis not present

## 2021-07-24 ENCOUNTER — Encounter: Payer: Self-pay | Admitting: Family Medicine

## 2021-07-31 ENCOUNTER — Encounter: Payer: Self-pay | Admitting: Family Medicine

## 2021-08-02 ENCOUNTER — Encounter: Payer: Self-pay | Admitting: Family Medicine

## 2021-08-04 ENCOUNTER — Telehealth: Payer: Self-pay | Admitting: Family Medicine

## 2021-08-04 NOTE — Telephone Encounter (Signed)
Please triage patient about urinary symptoms.  Thanks. ?

## 2021-08-05 ENCOUNTER — Telehealth: Payer: Self-pay

## 2021-08-05 MED ORDER — MIRABEGRON ER 25 MG PO TB24
25.0000 mg | ORAL_TABLET | Freq: Every day | ORAL | 3 refills | Status: DC
Start: 2021-08-05 — End: 2021-09-09

## 2021-08-05 NOTE — Telephone Encounter (Signed)
Patient has been notified of medication changes. Patient verbalized understanding and advised to call back if she has any problems. ?

## 2021-08-05 NOTE — Telephone Encounter (Signed)
I spoke with pt;pt said she has had symptoms for a very long time; pt said she feels like she needs to urinate all the time and pt does not feel like the bladder completely empties. Pt has frequency and voiding smaller amts than usual and the sense of urgency is bad; pt tries to be near a rest room wherever she is. Sometimes pt starts to urinate before can get to restroom and the last time that happened was 08/03/21 while at [facility] seeing the eye doctor.pt said she just does not feel like she has control of urine anymore. Pt saw Dr Sherron Monday urologist 01/28/2020 and the myrbetriq helped with symptoms but was too expensive. Pt said Dr Sherron Monday also suggested oxybutynin but pt did not return to urologist. Pt said was just a time she did not feel like getting out so she did not FU with urology. Pt said she gets up 3 - 5 times a night to urinate and pt stops drinking fluids at supper time. Pt only wants appt with Dr Para March; pt said if Dr Para March can send in med to [facility] that would be fine because pt is sure she does not have UTI. No burning or pain upon urination. The next available appt on Dr Lianne Bushy schedule is not until 08/16/21. Pt request cb after Dr Para March reviews this note. Sending note to Dr Para March and [name] CMA. ?

## 2021-08-05 NOTE — Telephone Encounter (Signed)
I would stop oxybutynin and try myrbetriq.  Rx resent.  Let me know if she can't get it filled.  Thanks.  ?

## 2021-08-05 NOTE — Progress Notes (Signed)
? ? ?  Chronic Care Management ?Pharmacy Assistant  ? ?Name: [name]  MRN: [id number] DOB: 04-15-1930 ? ?Reason for Encounter: CCM Counsellor) ?  ?Medications: ?Outpatient Encounter Medications as of 08/05/2021  ?Medication Sig Note  ? acetaminophen (TYLENOL) 500 MG tablet Take 1,000 mg by mouth in the morning and at bedtime.   ? apixaban (ELIQUIS) 2.5 MG TABS tablet Take 1 tablet (2.5 mg total) by mouth 2 (two) times daily.   ? buPROPion (WELLBUTRIN SR) 150 MG 12 hr tablet Take 150 mg by mouth daily.   ? calcium carbonate (TUMS - DOSED IN MG ELEMENTAL CALCIUM) 500 MG chewable tablet Chew 500 mg by mouth 2 (two) times daily as needed for indigestion or heartburn.   ? cholecalciferol (VITAMIN D3) 25 MCG (1000 UNIT) tablet Take 1,000 Units by mouth in the morning.   ? cholestyramine (QUESTRAN) 4 g packet TAKE 1 PACKET (4 G TOTAL) BY MOUTH DAILY WITH SUPPER.   ? digoxin (LANOXIN) 0.125 MG tablet Take 0.5 tablets (0.0625 mg total) by mouth daily.   ? loperamide (IMODIUM) 2 MG capsule Take 2 mg by mouth 3 (three) times daily as needed for diarrhea or loose stools.    ? losartan (COZAAR) 25 MG tablet    ? Menthol-Methyl Salicylate (SALONPAS PAIN RELIEF PATCH EX) Apply 2 patches topically daily as needed (pain).   ? metoprolol succinate (TOPROL-XL) 100 MG 24 hr tablet Take 1 tablet (100 mg total) by mouth daily. Take with or immediately following a meal.   ? oxybutynin (DITROPAN-XL) 5 MG 24 hr tablet Take 1 tablet (5 mg total) by mouth at bedtime.   ? pantoprazole (PROTONIX) 40 MG tablet Take 1 tablet (40 mg total) by mouth daily.   ? Polyethyl Glycol-Propyl Glycol (SYSTANE OP) Place 1-2 drops into the left eye 3 (three) times daily as needed (dryness).   ? potassium chloride SA (KLOR-CON) 20 MEQ tablet Take 2 tablets (40 mEq total) by mouth daily.   ? torsemide (DEMADEX) 20 MG tablet Take 1 tablet (20 mg total) by mouth daily as needed. if 3 pound weight gain overnight or 5 pounds in a week.   ? venlafaxine  XR (EFFEXOR XR) 37.5 MG 24 hr capsule Take 1 capsule (37.5 mg total) by mouth daily with breakfast. 07/08/2021: No started yet - tapering sertraline  ? ?No facility-administered encounter medications on file as of 08/05/2021.  ? ?[name] was contacted to remind of upcoming telephone visit with Charlene Brooke on 08/10/2021 at 8:45. Patient was reminded to have any blood glucose and blood pressure readings available for review at appointment.  ? ?Patient confirmed appointment. ? ?Are you having any problems with your medications? No  ? ?Do you have any concerns you like to discuss with the pharmacist? No ? ?Star Rating Drugs: ?Medication:  Last Fill: Day Supply ?No star rating drugs noted ? ?Charlene Brooke, CPP notified ? ?Marijean Niemann, RMA ?Clinical Pharmacy Assistant ?[phone] ? ? ? ? ?

## 2021-08-05 NOTE — Addendum Note (Signed)
Addended by: [name] on: 08/05/2021 01:53 PM ? ? Modules accepted: Orders ? ?

## 2021-08-08 DIAGNOSIS — M810 Age-related osteoporosis without current pathological fracture: Secondary | ICD-10-CM | POA: Diagnosis not present

## 2021-08-08 DIAGNOSIS — M6281 Muscle weakness (generalized): Secondary | ICD-10-CM | POA: Diagnosis not present

## 2021-08-08 DIAGNOSIS — S82841D Displaced bimalleolar fracture of right lower leg, subsequent encounter for closed fracture with routine healing: Secondary | ICD-10-CM | POA: Diagnosis not present

## 2021-08-08 DIAGNOSIS — Z9181 History of falling: Secondary | ICD-10-CM | POA: Diagnosis not present

## 2021-08-10 ENCOUNTER — Other Ambulatory Visit: Payer: Self-pay | Admitting: Physician Assistant

## 2021-08-10 ENCOUNTER — Encounter: Payer: Self-pay | Admitting: Family Medicine

## 2021-08-10 ENCOUNTER — Other Ambulatory Visit: Payer: Self-pay | Admitting: Cardiovascular Disease

## 2021-08-10 ENCOUNTER — Ambulatory Visit (INDEPENDENT_AMBULATORY_CARE_PROVIDER_SITE_OTHER): Payer: Medicare Other | Admitting: Pharmacist

## 2021-08-10 ENCOUNTER — Other Ambulatory Visit: Payer: Self-pay | Admitting: Family Medicine

## 2021-08-10 DIAGNOSIS — R32 Unspecified urinary incontinence: Secondary | ICD-10-CM

## 2021-08-10 DIAGNOSIS — M8080XS Other osteoporosis with current pathological fracture, unspecified site, sequela: Secondary | ICD-10-CM

## 2021-08-10 DIAGNOSIS — I5042 Chronic combined systolic (congestive) and diastolic (congestive) heart failure: Secondary | ICD-10-CM

## 2021-08-10 DIAGNOSIS — K580 Irritable bowel syndrome with diarrhea: Secondary | ICD-10-CM

## 2021-08-10 DIAGNOSIS — Z5181 Encounter for therapeutic drug level monitoring: Secondary | ICD-10-CM

## 2021-08-10 DIAGNOSIS — I4891 Unspecified atrial fibrillation: Secondary | ICD-10-CM

## 2021-08-10 DIAGNOSIS — F339 Major depressive disorder, recurrent, unspecified: Secondary | ICD-10-CM

## 2021-08-10 NOTE — Progress Notes (Signed)
? ?Chronic Care Management ?Pharmacy Note ? ?08/12/2021 ?Name:  [name] MRN:  [id number] DOB:  [birth year] ? ?Summary: CCM F/U visit ?-Pt is off all antidepressants (venlafaxine, bupropion) and denies depressed mood ?-Pt started taking prednisone 5 mg daily (ortho) and she is interested in taking this chronically as it has helped with her joint pain and IBS-D. Discussed risks of chronic steroid use - elevated BP, BG, weight gain, fracture risk ?-Assessed her history of hip fracture - appears to be fragility fracture, which qualifies her for treatment of osteoporosis even without DEXA scan (pt has declined repeat DEXA); given significant GI and reflux issues would avoid oral bisphosphonates; she is a good candidate for Prolia or zoledronic acid injections ?-Pt started Myrbetriq and reports minimal improvement in urinary frequency ? ?Recommendations/Changes made from today's visit: ?-Add daily calcium supplement ?-Increase Myrebtriq to 2 tablets (50 mg daily); monitor BP daily, contact office if SBP > 150 consistently ?-Consider Prolia injections; pt did not agree today but may discuss with PCP ? ?Plan: ?-Pharmacist follow up televisit scheduled for 1 month ? ? ? ?Subjective: ?[name] is an 88 y.o. year old female who is a primary patient of Damita Dunnings, Elveria Rising, MD.  The CCM team was consulted for assistance with disease management and care coordination needs.   ? ?Engaged with patient by telephone for follow up visit in response to provider referral for pharmacy case management and/or care coordination services.  ? ?Consent to Services:  ?The patient was given information about Chronic Care Management services, agreed to services, and gave verbal consent prior to initiation of services.  Please see initial visit note for detailed documentation.  ? ?Patient Care Team: ?[name], MD as PCP - General ?Sanda Klein, MD as PCP - Cardiology (Cardiology) ?Shon Hough, MD as Consulting Physician  (Ophthalmology) ?Eula Listen, DDS as Referring Physician (Dentistry) ?Melrose Nakayama, MD as Consulting Physician (Orthopedic Surgery) ?Nobie Alleyne, Cleaster Corin, [facility] as Pharmacist (Pharmacist) ? ?Patient lives at home with her husband. She is currently using a walker to get around after knee and ankle surgeries earlier this year. ? ?Recent office visits: ?06/18/21 Dr Damita Dunnings OV: f/u depression. Pt is now off bupropion, doing ok with sertraline 150 mg but still mood is down, fatigued. Taper sertraline over 4 weeks, then switch to Venlafaxine.  ? ?05/04/21 Dr Damita Dunnings OV: chronic f/u; try Flonase for hoarse voice. Consider med options for depression (on sertraline, bupropion now). Increase sertraline to 3 tablets (150 mg) daily. ? ?03/26/21 Eugenia Pancoast NP OV: UTI. Rx Augmentin x 10 days ? ?01/19/2021 - Elsie Stain, MD - Patient Message - Start Keflex d/t dysuria. ? ?10/15/2020 - Elsie Stain, MD - F/U R ankle fx/SNF palcement. In wheelchair, not weight-baring. Consulted w/ Dr Oval Linsey re: torsemide dosing given inability to weigh at home - advised torsemide twice a week and repeat BMP in a few weeks. Labs: CBC and BMP. Stop due to patient not taking: methocarbamol, prednisone, tramadol ? ?Recent consult visits: ?03/10/2021 - Sanda Klein, MD - Cardiology - F/U Afib, HF. Orders: Digoxin level (with next PCP visit) and EKG. No medication changes. ?  ?03/09/2021 - Ellouise Newer, PA - Gastroenterology - Patient presented for IBS-D, Lymphocytic colitis. Start: Budesonide 9 mg x 6 weeks, then taper down to 6 mg x 2 weeks, then 3 mg x 2 weeks ? ?12/15/2020 - [name], MD - Cardiology - Patient presented for left bundle branch block. Start: pantoprazole (PROTONIX) 40 MG tablet due to GERD, cough  and hoarseness. Stop losartan (low BP, fatigue) ? ?12/12/2020 Laurann Montana, NP - Cardiology - Patient Message - Change: Losartan from 25 mg daily to 12.5 mg daily due to Hypotension. Stop due to patient reporting not  taking: Farxiga 5 mg due to side effects of dry mouth and dry eyes.  ? ?11/12/2020 - Alvina Filbert, LPN - Cardiology - Patient Message - Stop: Klor Con 20 meq packets due to cost. Start: potassium chloride SA (KLOR-CON) 20 MEQ tablet - Take 2 tablets (40 mEq total) by mouth daily. ? ?11/10/2020 - Elly Modena, LPN - Cardiology - Patient Message - Start: Klor Con 20 meq packets. Dissolve 1 packet in cold water or beverage twice daily after meals. Patient requested another form of potassium as pills are too hard to take.  ? ?10/08/2020 - [place] - Patient presented for difficulty in walking, unspecified atrial fibrillation, chronic combined systolic and diastolic heart failure and displaced bimalleolar fracture of right lower leg. No other information.  ?10/07/2020 - Melrose Nakayama - Orthopedic Surgery - Patient presented for pain in right shoulder. Procedures: PR BETAMETHASONE ACET&SOD PHOSP, CHG X-RAY ANKLE 3+ VW and PR DRAIN/INJECT LARGE JOINT/BURSA. No other information.  ?10/01/2020 - [place] - Patient presented for difficulty in walking. No other information.  ?09/21/2020 - Alvina Filbert, LPN - Patient Message - Advised to take Torsemide daily for 3 days only them resume PRN for weight gain.  ? ?Hospital visits: ?Medication Reconciliation was completed by comparing discharge summary, patient?s EMR and Pharmacy list, and upon discussion with patient. ?  ?Admitted to the hospital on 09/22/2020 due to Ankle surgery s/p fracture. Discharge date was 09/24/2020. Discharged from [facility].   ?  ?New?Medications Started at [facility] Discharge:?? ?-started methocarbamol 500 MG tablet PRN ?-started tramadol 50 mg PRN  ?  ?Medications that remain the same after Hospital Discharge:??  ?-All other medications will remain the same.   ?  ?Admission 06/23/20-06/25/20: acute HF exacerbation - 10 lb wt gain after knee surgery. ECHO EF 25-30%; diuresed 4.5 L. Started Farxiga  10 mg daily, Start Losartan 25 mg, plan to transition to Entresto and spironolactone. ? ?Admission 06/16/20-06/17/20: knee surgery repair. ? ?Objective: ? ?Lab Results  ?Component Value Date  ? CREATININE 0.78 05/04/2021  ? BUN 17 05/04/2021  ? GFR 67.91 05/04/2021  ? GFRNONAA >60 09/22/2020  ? GFRAA 83 05/28/2020  ? NA 130 (L) 05/04/2021  ? K 4.8 05/04/2021  ? CALCIUM 9.2 05/04/2021  ? CO2 26 05/04/2021  ? GLUCOSE 86 05/04/2021  ? ? ?Lab Results  ?Component Value Date/Time  ? HGBA1C 5.4 09/22/2020 11:10 AM  ? HGBA1C 5.4 06/24/2020 12:19 AM  ? GFR 67.91 05/04/2021 01:30 PM  ? GFR 63.27 10/15/2020 01:24 PM  ?  ?Last diabetic Eye exam: No results found for: HMDIABEYEEXA  ?Last diabetic Foot exam: No results found for: HMDIABFOOTEX  ? ?Lab Results  ?Component Value Date  ? CHOL 144 05/04/2021  ? HDL 60.50 05/04/2021  ? [place] 54 05/04/2021  ? LDLDIRECT 58.0 12/27/2018  ? TRIG 143.0 05/04/2021  ? CHOLHDL 2 05/04/2021  ? ? ? ?  Latest Ref Rng & Units 06/24/2020  ? 12:19 AM 04/30/2020  ? 12:40 PM 10/29/2019  ? 12:36 PM  ?Hepatic Function  ?Total Protein 6.5 - 8.1 g/dL 5.8   6.5   6.5    ?Albumin 3.5 - 5.0 g/dL 3.1   3.9   3.8    ?AST 15 -  41 U/L '27   16   15    ' ?ALT 0 - 44 U/L '19   11   8    ' ?Alk Phosphatase 38 - 126 U/L 73   84   85    ?Total Bilirubin 0.3 - 1.2 mg/dL 1.6   0.8   0.5    ?Bilirubin, Direct 0.0 - 0.2 mg/dL 0.5      ? ? ?Lab Results  ?Component Value Date/Time  ? TSH 3.65 05/04/2021 01:30 PM  ? TSH 4.356 06/24/2020 12:19 AM  ? TSH 2.53 10/29/2019 12:36 PM  ? ? ? ?  Latest Ref Rng & Units 05/04/2021  ?  1:30 PM 10/15/2020  ?  1:24 PM 09/22/2020  ? 11:10 AM  ?CBC  ?WBC 4.0 - 10.5 K/uL 5.8   8.5   7.3    ?Hemoglobin 12.0 - 15.0 g/dL 13.5   13.0   12.1    ?Hematocrit 36.0 - 46.0 % 41.7   39.8   37.6    ?Platelets 150.0 - 400.0 K/uL 180.0   274.0   169    ? ? ?Lab Results  ?Component Value Date/Time  ? VD25OH 36.99 03/28/2019 12:25 PM  ? VD25OH 14.37 (L) 12/27/2018 04:57 PM  ? ? ?Clinical ASCVD: Yes  ?The ASCVD Risk score  (Arnett DK, et al., 2019) failed to calculate for the following reasons: ?  The 2019 ASCVD risk score is only valid for ages 31 to 37 ?  The patient has a prior MI or stroke diagnosis   ? ? ?  07/12/2021  ?

## 2021-08-12 NOTE — Patient Instructions (Signed)
Visit Information ? ?Phone number for Pharmacist: [phone] ? ? Goals Addressed   ?None ?  ? ? ?Care Plan : CCM Pharmacy Care Plan  ?Updates made by Kathyrn Sheriff, RPH since 08/12/2021 12:00 AM  ?  ? ?Problem: Hypertension, Hyperlipidemia, Atrial Fibrillation, Heart Failure, Coronary Artery Disease, Depression, Osteoporosis, and Overactive Bladder   ?Priority: High  ?  ? ?Long-Range Goal: Disease mgmt   ?Start Date: 03/24/2021  ?Expected End Date: 03/24/2022  ?This Visit's Progress: On track  ?Recent Progress: On track  ?Priority: High  ?Note:   ?Current Barriers:  ?Suboptimal therapeutic regimen for OAB, osteoporosis ? ?Pharmacist Clinical Goal(s):  ?Patient will adhere to plan to optimize therapeutic regimen for OAB, osteoporosis as evidenced by report of adherence to recommended medication management changes through collaboration with PharmD and provider.  ? ?Interventions: ?1:1 collaboration with [name], MD regarding development and update of comprehensive plan of care as evidenced by provider attestation and co-signature ?Inter-disciplinary care team collaboration (see longitudinal plan of care) ?Comprehensive medication review performed; medication list updated in electronic medical record ? ?Hyperlipidemia: (LDL goal < 70) ?-Controlled - LDL 54 (04/2021) at goal; TRIG 143 ?-Hx MI. Noted statin intolerance in chart. ?-Current treatment: ?Cholestyramine 4 g daily PM (for diarrhea) - Appropriate, Effective, Safe, Accessible ?-Educated on Cholesterol goals ?-Recommend to continue current medication ? ?Persistent Atrial Fibrillation (Goal: prevent stroke and major bleeding) ?-Controlled - pt endorses compliance with medications and denies issues; recent digoxin level was not elevated (04/2021) ?-CHADSVASC: 6; chronic LBBB; hx of falls ?-Age 3, wt 65 kg, Cr 0.72 ?-Current treatment: ?Metoprolol succinate 100 mg daily -Appropriate, Effective, Safe, Accessible ?Digoxin 0.125 mg - 1/2 tab daily -  Appropriate, Effective, Safe, Accessible ?Eliquis 2.5 mg BID - Appropriate, Effective, Safe, Accessible ?-Medications previously tried: amiodarone ?-Counseled on increased risk of stroke due to Afib and benefits of anticoagulation for stroke prevention; importance of adherence to anticoagulant exactly as prescribed; bleeding risk associated with Eliquis and importance of self-monitoring for signs/symptoms of bleeding; avoidance of NSAIDs due to increased bleeding risk with anticoagulants; ?-Recommended to continue current medication ? ?Heart Failure / Hypertension (Goal: BP < 140/90, prevent exacerbations) ?-Controlled - pt is weighing every day, she takes torsemide when weight increased 2+ lbs; she reports she has been retaining more fluid lately and has taken torsemide twice this week (usually takes 1 every few weeks), she does not like to take torsemide because it "puts her in the bathroom all day" ?-Pt also endorses occasional dizziness; she is not checking BP at these times so unclear if this is related to hypotension ?-Last ejection fraction: 25-30% (Date: 06/2020) ?-HF type: Combined Systolic and Diastolic; NYHA Class: I (no actitivty limitation) ?-Current home BP/HR readings: SBP 115-120 ?-Current treatment: ?Metoprolol succinate 100 mg daily - Appropriate, Effective, Safe, Accessible ?Digoxin 0.125 mg - 1/2 tab dailyprednis - Appropriate, Effective, Query Safe, Accessible ?Torsemide 20 mg daily PRN - Appropriate, Effective, Safe, Accessible ?Potassium chloride 20 mEq - 1 tab daily - Appropriate, Effective, Query Safe, Accessible ?-Medications previously tried: [place] (weakness), Ace-inhibitor (cough), losartan (low BP) ; cardiologist advised avoid Entresto, spironolactone due to low BP ?-Educated on BP goals and benefits of medications for prevention of heart attack, stroke and kidney damage;Importance of home blood pressure monitoring; ?-Advised to take the torsemide for 2+ lb weight gain; advised to limit  salt intake ?-Advised to check BP when feeling dizzy; if it is low, contact PCP or cardiologist for med adjustment ?-Recommend to continue current medication ? ?  Depression (Goal: manage symptoms) ?-Controlled - pt reports she is off all antidepressants, she denies depressed mood or depression ?-PHQ9: 20 (06/2021) - severe depression ?-Current treatment: ?None ?-Medications previously tried: sertraline, venlafaxine, bupropion ?-Connected with PCP for mental health support ?-Discussed s/sx of depression, advised pt to contact PCP if sx return/worsen ? ?Osteoporosis (Goal prevent fractures) ?-Not ideally controlled - pt has no DEXA on file and is not taking medication to improve bone density ?-Dx from chart review 2012. Hx hip fracture and multiple falls. Pt has declined follow up DEXA scans previously; she recently started prednisone 5 mg daily which further increases fracture risk ?-Current treatment  ?Vitamin D 1000 IU daily ?-Assessed her history of hip fracture - appears to be fragility fracture, which qualifies her for treatment of osteoporosis; given significant GI and reflux issues would avoid oral bisphosphonates; she is a good candidate for Prolia or zoledronic acid injections ?-Recommended DEXA scan to evaluate bone density - pt declined ?-Recommend weight-bearing and muscle strengthening exercises for building and maintaining bone density. ?-Advised to start a calcium supplement daily (at least 600 mg) ?-Consider starting Prolia injections given hx of hip fracture, hx multiple falls  ? ?IBS-D / Lymphocytic Colitis (Goal: manage symtoms) ?-Improving - pt reports she is taking prednisone 5 mg daily per ortho for joint pain, she noticed IBS symptoms have improved on it as well, she is interested in continuing this indefinitely ?-colitis flare as of 03/09/21; follows with GI;  ?-Current treatment  ?Cholestyramine 4 g daily - Appropriate, Effective, Safe, Accessible ?Loperamide 2 mg PRN -takes 2-3 per day -  Appropriate, Effective, Safe, Accessible ?Prednisone 5 mg daily - Appropriate, Effective, Query Safe ?-Medications previously used: budesonide (flares) ?-Reviewed risks of chronic steroid use - high BP, BG, agitation, bone density/fracture risks ?-Advised to follow up with GI for further mgmt of IBS ? ?OAB (Goal: reduce urinary frequency) ?-Not ideally controlled -pt reports minimal improvement in urinary frequency on Myrbetriq ?-Preferred formulary options: oxybutynin (Tier 1), tolterodine, trospium, solifenacin ?-Current treatment  ?Myrbetriq 25 mg daily - Appropriate, Query  Effective ?-Medications previously tried: oxybutynin ?-Recommend to increase Myrbetriq to 2 tablets daily; if effective, can refill with 50 mg dose ? ?Health Maintenance ?-Vaccine gaps: Covid booster, TDAP ?-Hand arthritis - wears brace at night, takes Tylenol ?-Takes melatonin 5 mg HS ?-Takes multivitamin  ? ?Patient Goals/Self-Care Activities ?Patient will:  ?- take medications as prescribed as evidenced by patient report and record review ?focus on medication adherence by pill box ?check blood pressure when dizzy, document, and provide at future appointments ?weigh daily, and contact provider if weight gain of 5+ lbs in a week ?  ?  ? ?Patient verbalizes understanding of instructions and care plan provided today and agrees to view in MyChart. Active MyChart status confirmed with patient.   ?Telephone follow up appointment with pharmacy team member scheduled for: 1 month ? ?[name], PharmD, BCACP ?Clinical Pharmacist ?[facility] ?[phone] ?  ?

## 2021-08-16 DIAGNOSIS — G5601 Carpal tunnel syndrome, right upper limb: Secondary | ICD-10-CM | POA: Diagnosis not present

## 2021-08-16 DIAGNOSIS — M7062 Trochanteric bursitis, left hip: Secondary | ICD-10-CM | POA: Diagnosis not present

## 2021-09-07 ENCOUNTER — Telehealth: Payer: Self-pay

## 2021-09-07 ENCOUNTER — Encounter: Payer: Self-pay | Admitting: Family Medicine

## 2021-09-07 NOTE — Progress Notes (Signed)
    Chronic Care Management Pharmacy Assistant   Name: [name]  MRN: [id number] DOB: 10-24-1930  Reason for Encounter: CCM (Appointment Reminder)   Medications: Outpatient Encounter Medications as of 09/07/2021  Medication Sig   acetaminophen (TYLENOL) 500 MG tablet Take 1,000 mg by mouth in the morning and at bedtime.   apixaban (ELIQUIS) 2.5 MG TABS tablet Take 1 tablet (2.5 mg total) by mouth 2 (two) times daily.   calcium carbonate (TUMS - DOSED IN MG ELEMENTAL CALCIUM) 500 MG chewable tablet Chew 500 mg by mouth 2 (two) times daily as needed for indigestion or heartburn.   cholecalciferol (VITAMIN D3) 25 MCG (1000 UNIT) tablet Take 1,000 Units by mouth in the morning.   cholestyramine (QUESTRAN) 4 g packet TAKE 1 PACKET (4 G TOTAL) BY MOUTH DAILY WITH SUPPER.   digoxin (LANOXIN) 0.125 MG tablet Take 0.5 tablets (0.0625 mg total) by mouth daily.   loperamide (IMODIUM) 2 MG capsule Take 2 mg by mouth 3 (three) times daily as needed for diarrhea or loose stools.    Menthol-Methyl Salicylate (SALONPAS PAIN RELIEF PATCH EX) Apply 2 patches topically daily as needed (pain).   metoprolol succinate (TOPROL-XL) 100 MG 24 hr tablet TAKE 1 TABLET BY MOUTH DAILY. TAKE WITH OR IMMEDIATELY FOLLOWING A MEAL.   mirabegron ER (MYRBETRIQ) 25 MG TB24 tablet Take 1 tablet (25 mg total) by mouth daily.   pantoprazole (PROTONIX) 40 MG tablet Take 1 tablet (40 mg total) by mouth daily.   Polyethyl Glycol-Propyl Glycol (SYSTANE OP) Place 1-2 drops into the left eye 3 (three) times daily as needed (dryness).   potassium chloride SA (KLOR-CON) 20 MEQ tablet Take 2 tablets (40 mEq total) by mouth daily.   predniSONE (DELTASONE) 5 MG tablet Take 5 mg by mouth daily with breakfast.   torsemide (DEMADEX) 20 MG tablet Take 1 tablet (20 mg total) by mouth daily as needed. if 3 pound weight gain overnight or 5 pounds in a week.   No facility-administered encounter medications on file as of 09/07/2021.   [name] was contacted to remind of upcoming telephone visit with Al Corpus on 09/09/2021 at 9:30. Patient was reminded to have any blood glucose and blood pressure readings available for review at appointment.   Patient confirmed appointment.  Are you having any problems with your medications? No   Do you have any concerns you like to discuss with the pharmacist? No  CCM referral has been placed prior to visit?  No   Star Rating Drugs: Medication:  Last Fill: Day Supply No star rating drugs  Al Corpus, CPP notified  Claudina Lick, [place] Clinical Pharmacy Assistant [phone]

## 2021-09-08 DIAGNOSIS — I110 Hypertensive heart disease with heart failure: Secondary | ICD-10-CM

## 2021-09-08 DIAGNOSIS — I5040 Unspecified combined systolic (congestive) and diastolic (congestive) heart failure: Secondary | ICD-10-CM

## 2021-09-08 DIAGNOSIS — M85859 Other specified disorders of bone density and structure, unspecified thigh: Secondary | ICD-10-CM

## 2021-09-08 DIAGNOSIS — I4819 Other persistent atrial fibrillation: Secondary | ICD-10-CM

## 2021-09-08 DIAGNOSIS — E785 Hyperlipidemia, unspecified: Secondary | ICD-10-CM

## 2021-09-08 DIAGNOSIS — F339 Major depressive disorder, recurrent, unspecified: Secondary | ICD-10-CM

## 2021-09-09 ENCOUNTER — Ambulatory Visit: Payer: Medicare Other | Admitting: Pharmacist

## 2021-09-09 ENCOUNTER — Other Ambulatory Visit: Payer: Self-pay | Admitting: Family Medicine

## 2021-09-09 DIAGNOSIS — I4891 Unspecified atrial fibrillation: Secondary | ICD-10-CM

## 2021-09-09 DIAGNOSIS — K580 Irritable bowel syndrome with diarrhea: Secondary | ICD-10-CM

## 2021-09-09 DIAGNOSIS — M8080XS Other osteoporosis with current pathological fracture, unspecified site, sequela: Secondary | ICD-10-CM

## 2021-09-09 DIAGNOSIS — R32 Unspecified urinary incontinence: Secondary | ICD-10-CM

## 2021-09-09 DIAGNOSIS — L608 Other nail disorders: Secondary | ICD-10-CM

## 2021-09-09 DIAGNOSIS — I5042 Chronic combined systolic (congestive) and diastolic (congestive) heart failure: Secondary | ICD-10-CM

## 2021-09-09 DIAGNOSIS — F339 Major depressive disorder, recurrent, unspecified: Secondary | ICD-10-CM

## 2021-09-09 NOTE — Progress Notes (Signed)
Chronic Care Management Pharmacy Note  09/09/2021 Name:  [name] MRN:  [id number] DOB:  11-Aug-1930  Summary: CCM F/U visit -Reviewed medications; pt has not changed HF/Afib medications and affirms compliance -Pt is off all antidepressants (venlafaxine, bupropion) and denies depressed mood -Pt is off Myrbetriq due to high cost in donut hole, plus not very effective; she reports nocturia is about the same  Recommendations/Changes made from today's visit: -Advised to contact PCP with any s/sx worsening depression -Advised to see Urology for OAB  Plan: -Bracken will call patient 1 month for general update -Pharmacist follow up televisit scheduled for 3 months    Subjective: [name] is an 88 y.o. year old female who is a primary patient of [name], Catherine Rising, MD.  The CCM team was consulted for assistance with disease management and care coordination needs.    Engaged with patient by telephone for follow up visit in response to provider referral for pharmacy case management and/or care coordination services.   Consent to Services:  The patient was given information about Chronic Care Management services, agreed to services, and gave verbal consent prior to initiation of services.  Please see initial visit note for detailed documentation.   Patient Care Team: Catherine Ghent, MD as PCP - General [name], Catherine Gobble, MD as PCP - Cardiology (Cardiology) Catherine Hough, MD as Consulting Physician (Ophthalmology) [name], DDS as Referring Physician (Dentistry) Catherine Nakayama, MD as Consulting Physician (Orthopedic Surgery) [name], [facility] as Pharmacist (Pharmacist)  Patient lives at home with her husband. She is currently using a walker to get around after knee and ankle surgeries earlier this year.  Recent office visits: 06/18/21 Dr [name] OV: f/u depression. Pt is now off bupropion, doing ok with sertraline 150 mg but still mood is down,  fatigued. Taper sertraline over 4 weeks, then switch to Venlafaxine.   05/04/21 Dr [name] OV: chronic f/u; try Flonase for hoarse voice. Consider med options for depression (on sertraline, bupropion now). Increase sertraline to 3 tablets (150 mg) daily.  03/26/21 Eugenia Pancoast NP OV: UTI. Rx Augmentin x 10 days  01/19/2021 - Elsie Stain, MD - Patient Message - Start Keflex d/t dysuria.  10/15/2020 - Elsie Stain, MD - F/U R ankle fx/SNF palcement. In wheelchair, not weight-baring. Consulted w/ Dr Oval Linsey re: torsemide dosing given inability to weigh at home - advised torsemide twice a week and repeat BMP in a few weeks. Labs: CBC and BMP. Stop due to patient not taking: methocarbamol, prednisone, tramadol  Recent consult visits: 03/10/2021 - Catherine Klein, MD - Cardiology - F/U Afib, HF. Orders: Digoxin level (with next PCP visit) and EKG. No medication changes.   03/09/2021 - Catherine Newer, PA - Gastroenterology - Patient presented for IBS-D, Lymphocytic colitis. Start: Budesonide 9 mg x 6 weeks, then taper down to 6 mg x 2 weeks, then 3 mg x 2 weeks  12/15/2020 - Catherine Latch, MD - Cardiology - Patient presented for left bundle branch block. Start: pantoprazole (PROTONIX) 40 MG tablet due to GERD, cough and hoarseness. Stop losartan (low BP, fatigue)  12/12/2020 - Catherine Montana, NP - Cardiology - Patient Message - Change: Losartan from 25 mg daily to 12.5 mg daily due to Hypotension. Stop due to patient reporting not taking: Farxiga 5 mg due to side effects of dry mouth and dry eyes.   11/12/2020 - Catherine Filbert, LPN - Cardiology - Patient Message - Stop: Klor Con 20 meq packets due to cost. Start:  potassium chloride SA (KLOR-CON) 20 MEQ tablet - Take 2 tablets (40 mEq total) by mouth daily.  11/10/2020 Catherine Modena, LPN - Cardiology - Patient Message - Start: Klor Con 20 meq packets. Dissolve 1 packet in cold water or beverage twice daily after meals. Patient requested another  form of potassium as pills are too hard to take.   10/08/2020 - [name] - Patient presented for difficulty in walking, unspecified atrial fibrillation, chronic combined systolic and diastolic heart failure and displaced bimalleolar fracture of right lower leg. No other information.  10/07/2020 - [name] - Orthopedic Surgery - Patient presented for pain in right shoulder. Procedures: PR BETAMETHASONE ACET&SOD PHOSP, CHG X-RAY ANKLE 3+ VW and PR DRAIN/INJECT LARGE JOINT/BURSA. No other information.  10/01/2020 - [name] - Patient presented for difficulty in walking. No other information.  09/21/2020 - Catherine Filbert, LPN - Patient Message - Advised to take Torsemide daily for 3 days only them resume PRN for weight gain.   Hospital visits: Medication Reconciliation was completed by comparing discharge summary, patient's EMR and Pharmacy list, and upon discussion with patient.   Admitted to the hospital on 09/22/2020 due to Ankle surgery s/p fracture. Discharge date was 09/24/2020. Discharged from [name]?Medications Started at [facility] Discharge:?? -started methocarbamol 500 MG tablet PRN -started tramadol 50 mg PRN    Medications that remain the same after Hospital Discharge:??  -All other medications will remain the same.     Admission 06/23/20-06/25/20: acute HF exacerbation - 10 lb wt gain after knee surgery. ECHO EF 25-30%; diuresed 4.5 L. Started Farxiga 10 mg daily, Start Losartan 25 mg, plan to transition to Entresto and spironolactone.  Admission 06/16/20-06/17/20: knee surgery repair.  Objective:  Lab Results  Component Value Date   CREATININE 0.78 05/04/2021   BUN 17 05/04/2021   GFR 67.91 05/04/2021   GFRNONAA >60 09/22/2020   GFRAA 83 05/28/2020   NA 130 (L) 05/04/2021   K 4.8 05/04/2021   CALCIUM 9.2 05/04/2021   CO2 26 05/04/2021   GLUCOSE 86 05/04/2021    Lab Results  Component Value Date/Time    HGBA1C 5.4 09/22/2020 11:10 AM   HGBA1C 5.4 06/24/2020 12:19 AM   GFR 67.91 05/04/2021 01:30 PM   GFR 63.27 10/15/2020 01:24 PM    Last diabetic Eye exam: No results found for: HMDIABEYEEXA  Last diabetic Foot exam: No results found for: HMDIABFOOTEX   Lab Results  Component Value Date   CHOL 144 05/04/2021   HDL 60.50 05/04/2021   LDLCALC 54 05/04/2021   LDLDIRECT 58.0 12/27/2018   TRIG 143.0 05/04/2021   CHOLHDL 2 05/04/2021       Latest Ref Rng & Units 06/24/2020   12:19 AM 04/30/2020   12:40 PM 10/29/2019   12:36 PM  Hepatic Function  Total Protein 6.5 - 8.1 g/dL 5.8   6.5   6.5    Albumin 3.5 - 5.0 g/dL 3.1   3.9   3.8    AST 15 - 41 U/L _0 ALT 0 - 44 U/L _1 Alk Phosphatase 38 - 126 U/L 73   84   85    Total Bilirubin 0.3 - 1.2 mg/dL 1.6   0.8   0.5    Bilirubin, Direct 0.0 - 0.2 mg/dL 0.5        Lab  Results  Component Value Date/Time   TSH 3.65 05/04/2021 01:30 PM   TSH 4.356 06/24/2020 12:19 AM   TSH 2.53 10/29/2019 12:36 PM       Latest Ref Rng & Units 05/04/2021    1:30 PM 10/15/2020    1:24 PM 09/22/2020   11:10 AM  CBC  WBC 4.0 - 10.5 K/uL 5.8   8.5   7.3    Hemoglobin 12.0 - 15.0 g/dL 13.5   13.0   12.1    Hematocrit 36.0 - 46.0 % 41.7   39.8   37.6    Platelets 150.0 - 400.0 K/uL 180.0   274.0   169      Lab Results  Component Value Date/Time   VD25OH 36.99 03/28/2019 12:25 PM   VD25OH 14.37 (L) 12/27/2018 04:57 PM    Clinical ASCVD: Yes  The ASCVD Risk score (Arnett DK, et al., 2019) failed to calculate for the following reasons:   The 2019 ASCVD risk score is only valid for ages 52 to 34   The patient has a prior MI or stroke diagnosis       07/12/2021   12:01 PM 06/18/2021   11:53 AM 01/11/2019    2:30 PM  Depression screen PHQ 2/9  Decreased Interest _0 Down, Depressed, Hopeless _1 PHQ - 2 Score _2 Altered sleeping 0 3   Tired, decreased energy 1 3   Change in appetite 0 3   Feeling bad or failure  about yourself  0 0   Trouble concentrating 0 1   Moving slowly or fidgety/restless 0 3   Suicidal thoughts 0 1   PHQ-9 Score 3 20   Difficult doing work/chores Not difficult at all Very difficult      CHA2DS2/VAS Stroke Risk Points  Current as of about an hour ago     6 >= 2 Points: High Risk  1 - 1.99 Points: Medium Risk  0 Points: Low Risk    Last Change: N/A      Points Metrics  1 Has Congestive Heart Failure:  Yes    Current as of about an hour ago  1 Has Vascular Disease:  Yes     Current as of about an hour ago  1 Has Hypertension:  Yes    Current as of about an hour ago  2 Age:  21    Current as of about an hour ago  0 Has Diabetes:  No    Current as of about an hour ago  0 Had Stroke:  No  Had TIA:  No  Had Thromboembolism:  No    Current as of about an hour ago  1 Female:  Yes    Current as of about an hour ago      Social History   Tobacco Use  Smoking Status Never  Smokeless Tobacco Never   BP Readings from Last 3 Encounters:  06/18/21 120/72  05/04/21 128/82  03/26/21 122/70   Pulse Readings from Last 3 Encounters:  06/18/21 81  05/04/21 75  03/26/21 (!) 112   Wt Readings from Last 3 Encounters:  07/12/21 143 lb (64.9 kg)  06/18/21 146 lb (66.2 kg)  05/04/21 143 lb (64.9 kg)   BMI Readings from Last 3 Encounters:  07/12/21 23.80 kg/m  06/18/21 28.51 kg/m  05/04/21 27.93 kg/m    Assessment/Interventions: Review of patient past medical history, allergies, medications, health status, including review of consultants reports,  laboratory and other test data, was performed as part of comprehensive evaluation and provision of chronic care management services.   SDOH:  (Social Determinants of Health) assessments and interventions performed: No - done 07/2021   SDOH Screenings   Alcohol Screen: Low Risk    Last Alcohol Screening Score (AUDIT): 0  Depression (PHQ2-9): Low Risk    PHQ-2 Score: 3  Financial Resource Strain: Low Risk     Difficulty of Paying Living Expenses: Not hard at all  Food Insecurity: No Food Insecurity   Worried About Charity fundraiser in the Last Year: Never true   Ran Out of Food in the Last Year: Never true  Housing: Low Risk    Last Housing Risk Score: 0  Physical Activity: Insufficiently Active   Days of Exercise per Week: 5 days   Minutes of Exercise per Session: 20 min  Social Connections: Engineer, building services of Communication with Friends and Family: More than three times a week   Frequency of Social Gatherings with Friends and Family: More than three times a week   Attends Religious Services: 1 to 4 times per year   Active Member of [employer] or Organizations: Yes   Attends Archivist Meetings: 1 to 4 times per year   Marital Status: Married  Stress: Stress Concern Present   Feeling of Stress : To some extent  Tobacco Use: Low Risk    Smoking Tobacco Use: Never   Smokeless Tobacco Use: Never   Passive Exposure: Not on file  Transportation Needs: No Transportation Needs   Lack of Transportation (Medical): No   Lack of Transportation (Non-Medical): No    CCM Care Plan  Allergies  Allergen Reactions   Ace Inhibitors Cough   Warfarin Sodium Other (See Comments)    DOSE RELATED PHARMACOLOGIC EFFECT "bleed out"   Famotidine Other (See Comments)    GI upset   Codeine Other (See Comments)    sedation   Delsym [Dextromethorphan Polistirex Er] Other (See Comments)    dizziness   Lactose Intolerance (Gi) Diarrhea   Lasix [Furosemide] Diarrhea    Oral Lasix gives her diarrhea, tolerates IV Rx   Phenylephrine Palpitations and Other (See Comments)    Nasal spray- "likely increase in nasal congestion".    Sulfamethoxazole-Trimethoprim Nausea And Vomiting    GI intolerance.    Medications Reviewed Today     Reviewed by [name], [facility] (Pharmacist) on 09/09/21 at [place] List Status: <None>   Medication Order Taking? Sig Documenting Provider Last  Dose Status Informant  acetaminophen (TYLENOL) 500 MG tablet [id number] Yes Take 1,000 mg by mouth in the morning and at bedtime. [provider] Taking Active Self  apixaban (ELIQUIS) 2.5 MG TABS tablet [id number] Yes Take 1 tablet (2.5 mg total) by mouth 2 (two) times daily. [name], Mihai, MD Taking Active   calcium carbonate (TUMS - DOSED IN MG ELEMENTAL CALCIUM) 500 MG chewable tablet [id number] Yes Chew 500 mg by mouth 2 (two) times daily as needed for indigestion or heartburn. [provider] Taking Active Self  cholecalciferol (VITAMIN D3) 25 MCG (1000 UNIT) tablet [id number] Yes Take 1,000 Units by mouth in the morning. [provider] Taking Active Self  cholestyramine (QUESTRAN) 4 g packet [id number] Yes TAKE 1 PACKET (4 G TOTAL) BY MOUTH DAILY WITH SUPPER. Gatha Mayer, MD Taking Active   digoxin (LANOXIN) 0.125 MG tablet [id number] Yes Take 0.5 tablets (0.0625 mg total) by mouth daily. Elsie Stain  S, MD Taking Active   loperamide (IMODIUM) 2 MG capsule [id number] Yes Take 2 mg by mouth 3 (three) times daily as needed for diarrhea or loose stools.  [provider] Taking Active Self  Menthol-Methyl Salicylate [employer] PAIN RELIEF PATCH EX) [id number] Yes Apply 2 patches topically daily as needed (pain). [provider] Taking Active Self  metoprolol succinate (TOPROL-XL) 100 MG 24 hr tablet [id number] Yes TAKE 1 TABLET BY MOUTH DAILY. TAKE WITH OR IMMEDIATELY FOLLOWING A MEAL. [name], Mihai, MD Taking Active   pantoprazole (PROTONIX) 40 MG tablet [id number] Yes Take 1 tablet (40 mg total) by mouth daily. [name], Mihai, MD Taking Active   Polyethyl Glycol-Propyl Glycol (SYSTANE OP) [id number] Yes Place 1-2 drops into the left eye 3 (three) times daily as needed (dryness). [provider] Taking Active Self  potassium chloride SA (KLOR-CON) 20 MEQ tablet [id number] Yes Take 2 tablets (40 mEq total) by mouth daily. Catherine Latch, MD  Taking Active   torsemide [facility]) 20 MG tablet [id number] Yes Take 1 tablet (20 mg total) by mouth daily as needed. if 3 pound weight gain overnight or 5 pounds in a week. 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 12/03/2019   Left knee pain 12/03/2019   Moderate to severe pulmonary hypertension ([place]) 11/26/2019   Tremor of both hands 09/29/2019   Dysuria 08/06/2019   Severe tricuspid regurgitation    S/P hip hemiarthroplasty 08/04/2019   Chronic diarrhea 09/07/2017   Hoarseness of voice 09/07/2017   Total knee replacement status, left 04/25/17 05/06/2017   Primary osteoarthritis of left knee 04/25/2017   DJD (degenerative joint disease) 04/25/2017   Healthcare maintenance 02/08/2017   Vitamin D deficiency 02/08/2017   Microscopic colitis 09/27/2016   IBS (irritable bowel syndrome) 08/31/2016   At risk for falling 01/07/2016   Chronic combined systolic and diastolic heart failure (HCC)    Rectus sheath hematoma- no anticoagulation    Atrial fibrillation with RVR- CHADs VASc=4 06/18/2015   Diarrhea 06/12/2015   Advance care planning 10/23/2014   Leg edema 06/27/2012   Medicare annual wellness visit, subsequent 03/27/2012   Depression 03/05/2012   EDEMA 06/07/2010   Osteoporosis 05/23/2010   KNEE PAIN, LEFT 03/02/2010   LBBB (left bundle branch block) 10/28/2008   POST MI SEPTAL DEFECT 06/24/2008   Cough 06/24/2008   Nonischemic cardiomyopathy ([place]) 05/30/2008   ATRIAL FIBRILLATION 05/30/2008   HYPONATREMIA, HX OF 05/30/2008    Immunization  History  Administered Date(s) Administered   Fluad Quad(high Dose 65+) 12/14/2018, 01/23/2020   Influenza Whole 12/10/2009, 01/01/2012   Influenza,inj,Quad PF,6+ Mos 12/30/2013, 01/08/2015, 12/30/2015, 12/30/2016, 01/04/2018   Influenza-Unspecified 01/04/2021   PFIZER(Purple Top)SARS-COV-2 Vaccination 05/04/2019, 05/25/2019, 01/12/2020   Pneumococcal Conjugate-13 10/23/2014   Pneumococcal Polysaccharide-23 04/11/2006   Td 05/13/2010   Zoster Recombinat (Shingrix) 01/24/2019, 04/03/2019   Zoster, Live 04/11/2006    Conditions to be addressed/monitored:  Hypertension, Hyperlipidemia, Atrial Fibrillation, Heart Failure, Coronary Artery Disease, Depression, Osteoporosis, and Overactive Bladder  Care Plan : CCM Pharmacy Care Plan  Updates made  by [name], RPH since 09/09/2021 12:00 AM     Problem: Hypertension, Hyperlipidemia, Atrial Fibrillation, Heart Failure, Coronary Artery Disease, Depression, Osteoporosis, and Overactive Bladder   Priority: High     Long-Range Goal: Disease mgmt   Start Date: 03/24/2021  Expected End Date: 09/10/2022  This Visit's Progress: On track  Recent Progress: On track  Priority: High  Note:   Current Barriers:  Suboptimal therapeutic regimen for OAB, osteoporosis  Pharmacist Clinical Goal(s):  Patient will adhere to plan to optimize therapeutic regimen for OAB, osteoporosis as evidenced by report of adherence to recommended medication management changes through collaboration with PharmD and provider.   Interventions: 1:1 collaboration with Catherine Ghent, MD regarding development and update of comprehensive plan of care as evidenced by provider attestation and co-signature Inter-disciplinary care team collaboration (see longitudinal plan of care) Comprehensive medication review performed; medication list updated in electronic medical record  Hyperlipidemia: (LDL goal < 70) -Controlled - LDL 54 (04/2021) at goal; TRIG 143 -Hx MI. Noted  statin intolerance in chart. -Current treatment: Cholestyramine 4 g daily PM (for diarrhea) - Appropriate, Effective, Safe, Accessible -Educated on Cholesterol goals -Recommend to continue current medication  Persistent Atrial Fibrillation (Goal: prevent stroke and major bleeding) -Controlled - pt endorses compliance with medications and denies issues; recent digoxin level was not elevated (04/2021) -CHADSVASC: 6; chronic LBBB; hx of falls -Age 89, wt 65 kg, Cr 0.72 -Current treatment: Metoprolol succinate 100 mg daily -Appropriate, Effective, Safe, Accessible Digoxin 0.125 mg - 1/2 tab daily - Appropriate, Effective, Safe, Accessible Eliquis 2.5 mg BID - Appropriate, Effective, Safe, Accessible -Medications previously tried: amiodarone -Reviewed medications; pt would like to get off of digoxin, discussed increased risk of HF exacerbation when stopping digoxin, she can discuss with cardiology -Recommended to continue current medication  Heart Failure / Hypertension (Goal: BP < 140/90, prevent exacerbations) -Controlled - pt is weighing every day, she takes torsemide when weight increased 2+ lbs; she does not like to take torsemide because it "puts her in the bathroom all day" -Last ejection fraction: 25-30% (Date: 06/2020) -HF type: Combined Systolic and Diastolic; NYHA Class: I (no actitivty limitation) -Current home BP/HR readings: SBP 115-120 -Current treatment: Metoprolol succinate 100 mg daily - Appropriate, Effective, Safe, Accessible Digoxin 0.125 mg - 1/2 tab dailyprednis - Appropriate, Effective, Safe, Accessible Torsemide 20 mg daily PRN - Appropriate, Effective, Safe, Accessible Potassium chloride 20 mEq - 2 tab daily - Appropriate, Effective, Safe, Accessible -Medications previously tried: [place] (weakness), Ace-inhibitor (cough), losartan (low BP) ; cardiologist advised avoid Entresto, spironolactone due to low BP -Educated on BP goals and benefits of medications for  prevention of heart attack, stroke and kidney damage;Importance of home blood pressure monitoring;   -pt would like to get off of digoxin, discussed increased risk of HF exacerbation when stopping digoxin, she can discuss with cardiology -Advised to take the torsemide for 2+ lb weight gain; advised to limit salt intake -Advised to check BP when feeling dizzy; if it is low, contact PCP or cardiologist for med adjustment -Recommend to continue current medication  Depression (Goal: manage symptoms) -Controlled - pt reports she is off all antidepressants, she denies depressed mood or depression -PHQ9: 20 (06/2021) - severe depression -Current treatment: None -Medications previously tried: sertraline, venlafaxine, bupropion -Connected with PCP for mental health support -Discussed s/sx of depression, advised pt to contact PCP if sx return/worsen  Osteoporosis (Goal prevent fractures) -Not ideally controlled - pt has no DEXA on file and is not taking  medication to improve bone density -Dx from chart review 2012. Hx hip fracture and multiple falls. Pt has declined follow up DEXA scans previously -Current treatment  Vitamin D 1000 IU daily -Assessed her history of hip fracture - appears to be fragility fracture, which qualifies her for treatment of osteoporosis; given significant GI and reflux issues would avoid oral bisphosphonates; she is a good candidate for Prolia or zoledronic acid injections -Recommended DEXA scan to evaluate bone density - pt declined -Recommend weight-bearing and muscle strengthening exercises for building and maintaining bone density. -Advised to start a calcium supplement daily (at least 600 mg) -Consider starting Prolia injections given hx of hip fracture, hx multiple falls   IBS-D / Lymphocytic Colitis (Goal: manage symtoms) -Query controlled - pt reports recent diarrhea (last few days), she was taking OTC remedies (pepto bismol, immodium) and feels better today, she was  going to call GI if it did not improve but it seems to be improving now -colitis flare as of 03/09/21; follows with GI;  -Current treatment  Cholestyramine 4 g daily - Appropriate, Effective, Safe, Accessible Loperamide 2 mg PRN -takes 2-3 per day - Appropriate, Effective, Safe, Accessible -Medications previously used: budesonide (flares), prednisone (flares) -Reviewed medications; it is reasonable to use OTC remedies at recommended doses during flare -Advised to follow up with GI for further mgmt of IBS  OAB (Goal: reduce urinary frequency) -Not ideally controlled -pt stopped taking Myrbetriq due to cost, she reports nocturia is about the same -Preferred formulary options: oxybutynin (Tier 1), tolterodine, trospium, solifenacin -Current treatment  Myrbetriq 25 mg daily - stopped taking -Medications previously tried: oxybutynin -Advised to f/u with urology  Health Maintenance -Vaccine gaps: Covid booster, TDAP -Hand arthritis - wears brace at night, takes Tylenol -Takes melatonin 5 mg HS -Takes multivitamin   Patient Goals/Self-Care Activities Patient will:  - take medications as prescribed as evidenced by patient report and record review focus on medication adherence by pill box check blood pressure when dizzy, document, and provide at future appointments weigh daily, and contact provider if weight gain of 5+ lbs in a week     Medication Assistance: None required.  Patient affirms current coverage meets needs.  Compliance/Adherence/Medication fill history: Care Gaps: None  Star-Rating Drugs: None  Medication Access: Within the past 30 days, how often has patient missed a dose of medication? 0 Is a pillbox or other method used to improve adherence? Yes  Factors that may affect medication adherence? financial need Are meds synced by current pharmacy? No  Are meds delivered by current pharmacy? No  Does patient experience delays in picking up medications due to  transportation concerns? No   [facility] Services Reviewed: Is patient disadvantaged to use [facility]?: No  Current Rx insurance plan: [facility] Name and location of Current pharmacy:  [id number] [place] [place]Phone: [phone]: [phone] [facility] services reviewed with patient today?: No  Patient requests to transfer care to [facility]?: No  Reason patient declined to change pharmacies: Prefers to go out and pick up medications themselves   Care Plan and Follow Up Patient Decision:  Patient agrees to Care Plan and Follow-up.  Plan: Telephone follow up appointment with care management team member scheduled for:  3 months  LCharlene Brooke PharmD, BCACP Clinical Pharmacist LPoynorPrimary Care at [phone]

## 2021-09-09 NOTE — Patient Instructions (Signed)
Visit Information  Phone number for Pharmacist: [phone]   Goals Addressed   None     Care Plan : CCM Pharmacy Care Plan  Updates made by Kathyrn Sheriff, RPH since 09/09/2021 12:00 AM     Problem: Hypertension, Hyperlipidemia, Atrial Fibrillation, Heart Failure, Coronary Artery Disease, Depression, Osteoporosis, and Overactive Bladder   Priority: High     Long-Range Goal: Disease mgmt   Start Date: 03/24/2021  Expected End Date: 09/10/2022  This Visit's Progress: On track  Recent Progress: On track  Priority: High  Note:   Current Barriers:  Suboptimal therapeutic regimen for OAB, osteoporosis  Pharmacist Clinical Goal(s):  Patient will adhere to plan to optimize therapeutic regimen for OAB, osteoporosis as evidenced by report of adherence to recommended medication management changes through collaboration with PharmD and provider.   Interventions: 1:1 collaboration with [name], MD regarding development and update of comprehensive plan of care as evidenced by provider attestation and co-signature Inter-disciplinary care team collaboration (see longitudinal plan of care) Comprehensive medication review performed; medication list updated in electronic medical record  Hyperlipidemia: (LDL goal < 70) -Controlled - LDL 54 (04/2021) at goal; TRIG 143 -Hx MI. Noted statin intolerance in chart. -Current treatment: Cholestyramine 4 g daily PM (for diarrhea) - Appropriate, Effective, Safe, Accessible -Educated on Cholesterol goals -Recommend to continue current medication  Persistent Atrial Fibrillation (Goal: prevent stroke and major bleeding) -Controlled - pt endorses compliance with medications and denies issues; recent digoxin level was not elevated (04/2021) -CHADSVASC: 6; chronic LBBB; hx of falls -Age 88, wt 65 kg, Cr 0.72 -Current treatment: Metoprolol succinate 100 mg daily -Appropriate, Effective, Safe, Accessible Digoxin 0.125 mg - 1/2 tab daily -  Appropriate, Effective, Safe, Accessible Eliquis 2.5 mg BID - Appropriate, Effective, Safe, Accessible -Medications previously tried: amiodarone -Reviewed medications; pt would like to get off of digoxin, discussed increased risk of HF exacerbation when stopping digoxin, she can discuss with cardiology -Recommended to continue current medication  Heart Failure / Hypertension (Goal: BP < 140/90, prevent exacerbations) -Controlled - pt is weighing every day, she takes torsemide when weight increased 2+ lbs; she does not like to take torsemide because it "puts her in the bathroom all day" -Last ejection fraction: 25-30% (Date: 06/2020) -HF type: Combined Systolic and Diastolic; NYHA Class: I (no actitivty limitation) -Current home BP/HR readings: SBP 115-120 -Current treatment: Metoprolol succinate 100 mg daily - Appropriate, Effective, Safe, Accessible Digoxin 0.125 mg - 1/2 tab dailyprednis - Appropriate, Effective, Safe, Accessible Torsemide 20 mg daily PRN - Appropriate, Effective, Safe, Accessible Potassium chloride 20 mEq - 2 tab daily - Appropriate, Effective, Safe, Accessible -Medications previously tried: [place] (weakness), Ace-inhibitor (cough), losartan (low BP) ; cardiologist advised avoid Entresto, spironolactone due to low BP -Educated on BP goals and benefits of medications for prevention of heart attack, stroke and kidney damage;Importance of home blood pressure monitoring;   -pt would like to get off of digoxin, discussed increased risk of HF exacerbation when stopping digoxin, she can discuss with cardiology -Advised to take the torsemide for 2+ lb weight gain; advised to limit salt intake -Advised to check BP when feeling dizzy; if it is low, contact PCP or cardiologist for med adjustment -Recommend to continue current medication  Depression (Goal: manage symptoms) -Controlled - pt reports she is off all antidepressants, she denies depressed mood or depression -PHQ9: 20  (06/2021) - severe depression -Current treatment: None -Medications previously tried: sertraline, venlafaxine, bupropion -Connected with PCP for mental health support -Discussed s/sx  of depression, advised pt to contact PCP if sx return/worsen  Osteoporosis (Goal prevent fractures) -Not ideally controlled - pt has no DEXA on file and is not taking medication to improve bone density -Dx from chart review 2012. Hx hip fracture and multiple falls. Pt has declined follow up DEXA scans previously -Current treatment  Vitamin D 1000 IU daily -Assessed her history of hip fracture - appears to be fragility fracture, which qualifies her for treatment of osteoporosis; given significant GI and reflux issues would avoid oral bisphosphonates; she is a good candidate for Prolia or zoledronic acid injections -Recommended DEXA scan to evaluate bone density - pt declined -Recommend weight-bearing and muscle strengthening exercises for building and maintaining bone density. -Advised to start a calcium supplement daily (at least 600 mg) -Consider starting Prolia injections given hx of hip fracture, hx multiple falls   IBS-D / Lymphocytic Colitis (Goal: manage symtoms) -Query controlled - pt reports recent diarrhea (last few days), she was taking OTC remedies (pepto bismol, immodium) and feels better today, she was going to call GI if it did not improve but it seems to be improving now -colitis flare as of 03/09/21; follows with GI;  -Current treatment  Cholestyramine 4 g daily - Appropriate, Effective, Safe, Accessible Loperamide 2 mg PRN -takes 2-3 per day - Appropriate, Effective, Safe, Accessible -Medications previously used: budesonide (flares), prednisone (flares) -Reviewed medications; it is reasonable to use OTC remedies at recommended doses during flare -Advised to follow up with GI for further mgmt of IBS  OAB (Goal: reduce urinary frequency) -Not ideally controlled -pt stopped taking Myrbetriq due  to cost, she reports nocturia is about the same -Preferred formulary options: oxybutynin (Tier 1), tolterodine, trospium, solifenacin -Current treatment  Myrbetriq 25 mg daily - stopped taking -Medications previously tried: oxybutynin -Advised to f/u with urology  Health Maintenance -Vaccine gaps: Covid booster, TDAP -Hand arthritis - wears brace at night, takes Tylenol -Takes melatonin 5 mg HS -Takes multivitamin   Patient Goals/Self-Care Activities Patient will:  - take medications as prescribed as evidenced by patient report and record review focus on medication adherence by pill box check blood pressure when dizzy, document, and provide at future appointments weigh daily, and contact provider if weight gain of 5+ lbs in a week      Patient verbalizes understanding of instructions and care plan provided today and agrees to view in MyChart. Active MyChart status and patient understanding of how to access instructions and care plan via MyChart confirmed with patient.    Telephone follow up appointment with pharmacy team member scheduled for: 3 months  Al Corpus, PharmD, [facility] Clinical Pharmacist [facility] at [facility] [phone]

## 2021-09-10 DIAGNOSIS — M25571 Pain in right ankle and joints of right foot: Secondary | ICD-10-CM | POA: Diagnosis not present

## 2021-09-20 ENCOUNTER — Telehealth: Payer: Self-pay | Admitting: Physician Assistant

## 2021-09-20 DIAGNOSIS — R197 Diarrhea, unspecified: Secondary | ICD-10-CM

## 2021-09-20 DIAGNOSIS — M79674 Pain in right toe(s): Secondary | ICD-10-CM | POA: Diagnosis not present

## 2021-09-20 DIAGNOSIS — K582 Mixed irritable bowel syndrome: Secondary | ICD-10-CM

## 2021-09-20 DIAGNOSIS — K52832 Lymphocytic colitis: Secondary | ICD-10-CM

## 2021-09-20 NOTE — Progress Notes (Unsigned)
09/22/2021 [name] [id number] 1931-02-17  Referring provider: [name], MD Primary GI doctor: Dr. Leone Payor  ASSESSMENT AND PLAN:   Acute diarrhea -     DG Abd 1 View; Future No alarm symptoms with it.  Only once a day, after eating salad, getting stools studies to evaluate for infection. Getting KUB to rule out overflow diarrhea since only once a day.  If everything is negative can do trial of budesonide with history of lymphocytic colitis.  Discussed may also need to change the effexor, recently added around the same time.    Lymphocytic colitis If everything is negative can do trial of budesonide with history of lymphocytic colitis.  11/2017 Colonoscopy Dr. Leone Payor diverticulosis, + microscopic colitis, no polyps.   Weakness -     CBC with Differential/Platelet; Future -     Comprehensive metabolic panel; Future Has had diarrhea and takes fluid pills, some dizziness/weakness, no chest pain or SOB will get CBC/CMET to evaluate electrolytes.    History of Present Illness:  88 y.o. female  with a past medical history of A-fib with RVR on Eliquis, IBS-D lymphocytic colitis (x 2019) and others listed below, returns to clinic today for evaluation of diarrhea.  Last seen 03/09/2021 by Gershon Cull budesonide 3 mg 9 mg total taper over 42 days.   Called back 04/02/2021 stating her intestines felt better, having normal bowel movements.   Was recommended to have pelvic floor physical therapy but patient declined. 09/20/2021 patient called reporting liquid stools for 25 wks Taking 3-5 Imodium day without relief.   Had 5 mg prednisone daily from previous prescription that she took for 2 to 3 weeks without relief  Abdominal cramping relieved with bowel movement.  Explosive with lots of gas.  Liquid mixed with water.   Had not been taking the Colesytramine.  Took one last night and this has helped.   Patient states 2.5 weeks ago, she ate lunch out on Sunday with  salad at [employer] and has just watery diarrhea and unable to control it.  Mild lower AB pain.  Urgency with fecal incontinence.  Will be 1 x a day but states once it started will continue until her bowel's are done.  Rare nocturnal symptoms.  She  denies  blood in stool, fever, chills, unintentional weight loss.  She  denies  AB bloating.  Denies close contacts ill, recent travel. Patient with ABX for her toe 1.5 weeks ago but her symptoms had started.  Patient reports  new medications, started effexor about 4 weeks ago.  She is having some weakness, some dizziness, no chest pain or shortness of breath.   11/2017 Colonoscopy Dr. Leone Payor diverticulosis, + microscopic colitis, no polyps.   Husband with her and provides some of the history.  Just had 100 year anniversary, just had birth of 4th GREAT grand child, planning on selling house and moving with kids in [facility]  Current Medications:    Current Outpatient Medications (Cardiovascular):    cholestyramine (QUESTRAN) 4 g packet, TAKE 1 PACKET (4 G TOTAL) BY MOUTH DAILY WITH SUPPER.   digoxin (LANOXIN) 0.125 MG tablet, Take 0.5 tablets (0.0625 mg total) by mouth daily.   metoprolol succinate (TOPROL-XL) 100 MG 24 hr tablet, TAKE 1 TABLET BY MOUTH DAILY. TAKE WITH OR IMMEDIATELY FOLLOWING A MEAL.   torsemide (DEMADEX) 20 MG tablet, Take 1 tablet (20 mg total) by mouth daily as needed. if 3 pound weight gain overnight or 5 pounds in a week.  Current Outpatient Medications (Analgesics):    acetaminophen (TYLENOL) 500 MG tablet, Take 1,000 mg by mouth in the morning and at bedtime.   traMADol (ULTRAM) 50 MG tablet, Take 50 mg by mouth every 6 (six) hours as needed.  Current Outpatient Medications (Hematological):    apixaban (ELIQUIS) 2.5 MG TABS tablet, Take 1 tablet (2.5 mg total) by mouth 2 (two) times daily.  Current Outpatient Medications (Other):    calcium carbonate (TUMS - DOSED IN MG ELEMENTAL CALCIUM) 500 MG chewable tablet, Chew  500 mg by mouth 2 (two) times daily as needed for indigestion or heartburn.   cholecalciferol (VITAMIN D3) 25 MCG (1000 UNIT) tablet, Take 1,000 Units by mouth in the morning.   loperamide (IMODIUM) 2 MG capsule, Take 2 mg by mouth 3 (three) times daily as needed for diarrhea or loose stools.    Menthol-Methyl Salicylate (SALONPAS PAIN RELIEF PATCH EX), Apply 2 patches topically daily as needed (pain).   pantoprazole (PROTONIX) 40 MG tablet, Take 1 tablet (40 mg total) by mouth daily.   Polyethyl Glycol-Propyl Glycol (SYSTANE OP), Place 1-2 drops into the left eye 3 (three) times daily as needed (dryness).   potassium chloride SA (KLOR-CON) 20 MEQ tablet, Take 2 tablets (40 mEq total) by mouth daily.   venlafaxine XR (EFFEXOR-XR) 37.5 MG 24 hr capsule, Take 37.5 mg by mouth every morning.  Surgical History:  She  has a past surgical history that includes Vaginal hysterectomy (1979); Dilation and curettage of uterus; Cataract extraction w/ intraocular lens  implant, bilateral (Bilateral); Cardiac catheterization (2010); TEE without cardioversion (N/A, 06/19/2015); Cardioversion (N/A, 06/19/2015); Total knee arthroplasty (Left, 04/25/2017); Colonoscopy with propofol (N/A, 11/22/2017); biopsy (11/22/2017); Anterior approach hemi hip arthroplasty (Left, 08/05/2019); Total knee revision (Left, 12/03/2019); Total knee arthroplasty (Right, 02/18/2020); Total knee arthroplasty (Right, 06/16/2020); and ORIF ankle fracture (Right, 09/22/2020). Family History:  Her family history includes Colon cancer in her father; Other in her father; Tuberculosis in her mother. Social History:   reports that she has never smoked. She has never used smokeless tobacco. She reports that she does not drink alcohol and does not use drugs.  Current Medications, Allergies, Past Medical History, Past Surgical History, Family History and Social History were reviewed in [employer] record.  Physical Exam: BP 120/66    Pulse (!) 108   Ht 5\' 5"  (1.651 m)   Wt 149 lb 3.2 oz (67.7 kg)   SpO2 98%   BMI 24.83 kg/m  General:   Pleasant, obese, elderly female in no acute distress Heart : Irregular, irregular Pulm: Clear anteriorly; no wheezing Abdomen:  Soft, Obese AB, Active bowel sounds. No tenderness . , No organomegaly appreciated. Rectal: Not evaluated Extremities:  without  edema, some dependent rubor. Neurologic:  Alert and  oriented x4;  No focal deficits. Walks with walker.  Psych:  Cooperative. Normal mood and affect.   , PA-C 09/22/21

## 2021-09-20 NOTE — Telephone Encounter (Signed)
Called and spoke with patient. Pt reports that she has been having liquid stools for the past 25 weeks or so. Pt describes her stools as liquid mixed with water. Pt states that she has not been taking the Cholestyramine because she forgot about it. She has been taking 3-5 Imodium a day with no relief. Pt is not taking Budesonide, she states that she has an old prescription of Prednisone that she has been taking from Dr. Carlean Purl. Pt states that she has been taking Prednisone 5 mg daily for the past 2-3 weeks, this prescription expired in 04/2021. No relief with Prednisone. Pt denies any fever. She does report lower abdominal cramping prior to a BM. Pain resolves after a BM. Patient states that sometimes she is in the restroom for about 30 minutes at a time and sometimes its "explosive, with a lot of gas". Pt has an appt on 6/14 with Vicie Mutters, PA-C. I told pt that she has a hx of lymphocytic colitis and stool study results may not return prior to her appt. I told her that I will call her back with your recommendations. Please advise, thanks.

## 2021-09-20 NOTE — Telephone Encounter (Signed)
Called and spoke with patient regarding [name]'s recommendations. Pt states that her husband will stop by to pick up the stool kits today. Pt is aware that she will return samples to the lab. Pt verbalized understanding and had no concerns at the end of the call.

## 2021-09-20 NOTE — Telephone Encounter (Signed)
I am sure stool studies would be helpful for [facility].  GI path panel, fecal lactoferrin and O&P.  Thanks, JLL

## 2021-09-20 NOTE — Telephone Encounter (Signed)
Patient called requesting to be seen for chronic diarrhea she is currently on for 09/22/21 asked if she would need to do a stool sample test prior to her OV. Please advise.

## 2021-09-21 ENCOUNTER — Other Ambulatory Visit: Payer: Medicare Other

## 2021-09-21 DIAGNOSIS — T3695XA Adverse effect of unspecified systemic antibiotic, initial encounter: Secondary | ICD-10-CM | POA: Diagnosis not present

## 2021-09-21 DIAGNOSIS — K52832 Lymphocytic colitis: Secondary | ICD-10-CM | POA: Diagnosis not present

## 2021-09-21 DIAGNOSIS — K582 Mixed irritable bowel syndrome: Secondary | ICD-10-CM

## 2021-09-21 DIAGNOSIS — R197 Diarrhea, unspecified: Secondary | ICD-10-CM | POA: Diagnosis not present

## 2021-09-22 ENCOUNTER — Ambulatory Visit (INDEPENDENT_AMBULATORY_CARE_PROVIDER_SITE_OTHER)
Admission: RE | Admit: 2021-09-22 | Discharge: 2021-09-22 | Disposition: A | Discharge status: Home / Self Care | Payer: Medicare Other | Source: Ambulatory Visit | Attending: Physician Assistant | Admitting: Physician Assistant

## 2021-09-22 ENCOUNTER — Encounter: Payer: Self-pay | Admitting: Physician Assistant

## 2021-09-22 ENCOUNTER — Ambulatory Visit: Payer: Medicare Other | Admitting: Physician Assistant

## 2021-09-22 ENCOUNTER — Other Ambulatory Visit (INDEPENDENT_AMBULATORY_CARE_PROVIDER_SITE_OTHER): Payer: Medicare Other

## 2021-09-22 VITALS — BP 120/66 | HR 108 | Ht 65.0 in | Wt 149.2 lb

## 2021-09-22 DIAGNOSIS — M47816 Spondylosis without myelopathy or radiculopathy, lumbar region: Secondary | ICD-10-CM | POA: Diagnosis not present

## 2021-09-22 DIAGNOSIS — K529 Noninfective gastroenteritis and colitis, unspecified: Secondary | ICD-10-CM

## 2021-09-22 DIAGNOSIS — R531 Weakness: Secondary | ICD-10-CM

## 2021-09-22 DIAGNOSIS — M47814 Spondylosis without myelopathy or radiculopathy, thoracic region: Secondary | ICD-10-CM | POA: Diagnosis not present

## 2021-09-22 DIAGNOSIS — M4186 Other forms of scoliosis, lumbar region: Secondary | ICD-10-CM | POA: Diagnosis not present

## 2021-09-22 DIAGNOSIS — K52832 Lymphocytic colitis: Secondary | ICD-10-CM | POA: Diagnosis not present

## 2021-09-22 DIAGNOSIS — K589 Irritable bowel syndrome without diarrhea: Secondary | ICD-10-CM | POA: Diagnosis not present

## 2021-09-22 LAB — COMPREHENSIVE METABOLIC PANEL
ALT: 9 U/L (ref 0–35)
AST: 16 U/L (ref 0–37)
Albumin: 3.9 g/dL (ref 3.5–5.2)
Alkaline Phosphatase: 87 U/L (ref 39–117)
BUN: 14 mg/dL (ref 6–23)
CO2: 30 mEq/L (ref 19–32)
Calcium: 9.6 mg/dL (ref 8.4–10.5)
Chloride: 96 mEq/L (ref 96–112)
Creatinine, Ser: 0.79 mg/dL (ref 0.40–1.20)
GFR: 66.7 mL/min (ref >60.00)
Glucose, Bld: 94 mg/dL (ref 70–99)
Potassium: 4 mEq/L (ref 3.5–5.1)
Sodium: 134 mEq/L — ABNORMAL LOW (ref 135–145)
Total Bilirubin: 0.6 mg/dL (ref 0.2–1.2)
Total Protein: 6.6 g/dL (ref 6.0–8.3)

## 2021-09-22 LAB — CBC WITH DIFFERENTIAL/PLATELET
Basophils Absolute: 0.1 10*3/uL (ref 0.0–0.1)
Basophils Relative: 0.6 % (ref 0.0–3.0)
Eosinophils Absolute: 0.1 10*3/uL (ref 0.0–0.7)
Eosinophils Relative: 0.9 % (ref 0.0–5.0)
HCT: 41.8 % (ref 36.0–46.0)
Hemoglobin: 13.8 g/dL (ref 12.0–15.0)
Lymphocytes Relative: 14.7 % (ref 12.0–46.0)
Lymphs Abs: 1.6 10*3/uL (ref 0.7–4.0)
MCHC: 33.1 g/dL (ref 30.0–36.0)
MCV: 93.4 fl (ref 78.0–100.0)
Monocytes Absolute: 0.6 10*3/uL (ref 0.1–1.0)
Monocytes Relative: 5.7 % (ref 3.0–12.0)
Neutro Abs: 8.8 10*3/uL — ABNORMAL HIGH (ref 1.4–7.7)
Neutrophils Relative %: 78.1 % — ABNORMAL HIGH (ref 43.0–77.0)
Platelets: 247 10*3/uL (ref 150.0–400.0)
RBC: 4.48 Mil/uL (ref 3.87–5.11)
RDW: 14.5 % (ref 11.5–15.5)
WBC: 11.2 10*3/uL — ABNORMAL HIGH (ref 4.0–10.5)

## 2021-09-22 NOTE — Patient Instructions (Addendum)
If you are age 86 or older, your body mass index should be between 23-30. Your Body mass index is 24.83 kg/m. If this is out of the aforementioned range listed, please consider follow up with your Primary Care Provider. ________________________________________________________  The [facility] providers would like to encourage you to use [facility] to communicate with providers for non-urgent requests or questions.  Due to long hold times on the telephone, sending your provider a message by [facility] may be a faster and more efficient way to get a response.  Please allow 48 business hours for a response.  Please remember that this is for non-urgent requests.  _______________________________________________________  Your provider has requested that you go to the basement level for an Xray of your abdomen before leaving today.  Press "B" on the elevator.  The lab is located at the first door on the left as you exit the elevator.  Your provider has requested that you go to the basement level for lab work before leaving today. Press "B" on the elevator. The lab is located at the first door on the left as you exit the elevator.  Follow up pending.  Thank you for entrusting me with your care and choosing [facility].  Quentin Mulling, PA-C  Please avoid milk products, raw fruits, raw vegetables, high fat foods, artifical sweeteners, and carbonated beverages until symptoms resolve Go to the ER if any severe abdominal pain, fever, or weakness  Take the cholestyramine at night and see if this helps. If the stool samples and the xray is normal, will plan for you to take the budesonide for possible microscopic colitis.  If this does not help, need to try to change the effexor as this is the new medication.

## 2021-09-23 ENCOUNTER — Other Ambulatory Visit: Payer: Self-pay

## 2021-09-23 LAB — GI PROFILE, STOOL, PCR

## 2021-09-23 LAB — FECAL LACTOFERRIN, QUANT
Fecal Lactoferrin: POSITIVE — AB
MICRO NUMBER:: 13519113
SPECIMEN QUALITY:: ADEQUATE

## 2021-09-23 MED ORDER — SACCHAROMYCES BOULARDII 250 MG PO CAPS: 250.0000 mg | ORAL_CAPSULE | Freq: Two times a day (BID) | ORAL | 0 refills | Status: AC

## 2021-09-23 MED ORDER — VANCOMYCIN HCL 125 MG PO CAPS: 125.0000 mg | ORAL_CAPSULE | Freq: Four times a day (QID) | ORAL | 0 refills | Status: AC

## 2021-09-29 LAB — OVA AND PARASITE EXAMINATION
CONCENTRATE RESULT:: NONE SEEN
MICRO NUMBER:: 13518920
SPECIMEN QUALITY:: ADEQUATE
TRICHROME RESULT:: NONE SEEN

## 2021-10-04 ENCOUNTER — Encounter: Payer: Self-pay | Admitting: Cardiovascular Disease

## 2021-10-05 ENCOUNTER — Encounter: Payer: Self-pay | Admitting: Nurse Practitioner

## 2021-10-05 ENCOUNTER — Ambulatory Visit: Payer: Medicare Other | Admitting: Nurse Practitioner

## 2021-10-05 VITALS — BP 110/70 | HR 111 | Resp 20 | Ht 65.0 in | Wt 148.4 lb

## 2021-10-05 DIAGNOSIS — I4821 Permanent atrial fibrillation: Secondary | ICD-10-CM | POA: Diagnosis not present

## 2021-10-05 DIAGNOSIS — I071 Rheumatic tricuspid insufficiency: Secondary | ICD-10-CM

## 2021-10-05 DIAGNOSIS — I700 Atherosclerosis of aorta: Secondary | ICD-10-CM | POA: Diagnosis not present

## 2021-10-05 DIAGNOSIS — L819 Disorder of pigmentation, unspecified: Secondary | ICD-10-CM | POA: Diagnosis not present

## 2021-10-05 DIAGNOSIS — I5042 Chronic combined systolic (congestive) and diastolic (congestive) heart failure: Secondary | ICD-10-CM | POA: Diagnosis not present

## 2021-10-05 DIAGNOSIS — R931 Abnormal findings on diagnostic imaging of heart and coronary circulation: Secondary | ICD-10-CM

## 2021-10-05 DIAGNOSIS — I2721 Secondary pulmonary arterial hypertension: Secondary | ICD-10-CM | POA: Diagnosis not present

## 2021-10-05 DIAGNOSIS — I447 Left bundle-branch block, unspecified: Secondary | ICD-10-CM

## 2021-10-05 MED ORDER — METOPROLOL SUCCINATE ER 50 MG PO TB24: 50.0000 mg | ORAL_TABLET | Freq: Every day | ORAL | 3 refills | Status: DC

## 2021-10-06 ENCOUNTER — Encounter: Payer: Self-pay | Admitting: Family Medicine

## 2021-10-08 ENCOUNTER — Encounter: Payer: Self-pay | Admitting: Family Medicine

## 2021-10-08 ENCOUNTER — Encounter: Payer: Self-pay | Admitting: Cardiovascular Disease

## 2021-10-19 NOTE — Progress Notes (Signed)
10/25/2021 [name] [id number] 03-Jun-1930  Referring provider: [name], MD Primary GI doctor: Dr. Leone Payor  ASSESSMENT AND PLAN:   Cdiff diarrhea Treated with vancomycin had some improvement but then treated with ABX for UTI 2 weeks ago with worsening diarrhea and AB pain Will retreat for possible Cdiff diarrhea with pulse taper Can consider dificin if no response, follow up Dr. Leone Payor  Lymphocytic colitis If continues can consider trial of budesonide with history of lymphocytic colitis if C diff is negative 11/2017 Colonoscopy Dr. Leone Payor diverticulosis, + microscopic colitis, no polyps.   Weakness with history of CHF -     CBC with Differential/Platelet; Future -     Comprehensive metabolic panel; Future Has had diarrhea and takes fluid pills, some dizziness/weakness, has some SOB and CBC/CMET to evaluate electrolytes.  HAs bilateral rhonchi, follow up cardio, ER precautions discussed Patient also is selling house and moving with husband in with her children, has a lot of mental stress/fatigue.   History of Present Illness:  88 y.o. female  with a past medical history of A-fib with RVR on Eliquis, IBS-D lymphocytic colitis (x 2019) and others listed below, returns to clinic today for evaluation of Cdiff.  Last seen 09/22/2021 for acute worsening of diarrhea, stool study showed Cdiff, sent in Vancomycin 125mg  4 x a day for 14 days.  She states she had ABX for toe and then 2 weeks ago started on ABX for UTI and had diarrhea after treatment for Cdiff. Has history of chronic diarrhea, previously suggested pelvic floor physical therapy but patient declined. Patient reports  new medications, started effexor.  She is on imodium twice a day, she is still the cholestyramine at night.  She states she is having to strain to get hard stool out, has stools softeners that help.  She has been fatigued and some dizziness, she has some SOB, no chest pain. Metoprolol recently  increased to 150 mg.  She states she feels she is retaining fluid a lot, had diuretic Saturday, lost a lb and a half.   She states she is having BM after she eats every meal.  Can be hard or loose stools.   11/2017 Colonoscopy Dr. 12/2017 diverticulosis, + microscopic colitis, no polyps.   Husband with her and provides some of the history.  Just had 40 year anniversary, just had birth of 4th GREAT grand child, planning on selling house and moving with kids in [facility]  Current Medications:    Current Outpatient Medications (Cardiovascular):    cholestyramine (QUESTRAN) 4 g packet, TAKE 1 PACKET (4 G TOTAL) BY MOUTH DAILY WITH SUPPER.   digoxin (LANOXIN) 0.125 MG tablet, Take 0.5 tablets (0.0625 mg total) by mouth daily.   metoprolol succinate (TOPROL XL) 50 MG 24 hr tablet, Take 1 tablet (50 mg total) by mouth daily. Take with or immediately following a meal.   metoprolol succinate (TOPROL-XL) 100 MG 24 hr tablet, TAKE 1 TABLET BY MOUTH DAILY. TAKE WITH OR IMMEDIATELY FOLLOWING A MEAL. (Patient taking differently: Take 150 mg by mouth daily.)   torsemide (DEMADEX) 20 MG tablet, Take 1 tablet (20 mg total) by mouth daily as needed. if 3 pound weight gain overnight or 5 pounds in a week.   Current Outpatient Medications (Analgesics):    acetaminophen (TYLENOL) 500 MG tablet, Take 1,000 mg by mouth in the morning and at bedtime.  Current Outpatient Medications (Hematological):    apixaban (ELIQUIS) 2.5 MG TABS tablet, Take 1 tablet (2.5 mg total)  by mouth 2 (two) times daily.   vitamin B-12 (CYANOCOBALAMIN) 50 MCG tablet, Take 50 mcg by mouth daily.  Current Outpatient Medications (Other):    calcium carbonate (TUMS - DOSED IN MG ELEMENTAL CALCIUM) 500 MG chewable tablet, Chew 500 mg by mouth 2 (two) times daily as needed for indigestion or heartburn.   cholecalciferol (VITAMIN D3) 25 MCG (1000 UNIT) tablet, Take 1,000 Units by mouth in the morning.   loperamide (IMODIUM) 2 MG capsule, Take 2  mg by mouth 3 (three) times daily as needed for diarrhea or loose stools.    Polyethyl Glycol-Propyl Glycol (SYSTANE OP), Place 1-2 drops into the left eye 3 (three) times daily as needed (dryness).   potassium chloride SA (KLOR-CON) 20 MEQ tablet, Take 2 tablets (40 mEq total) by mouth daily.   vancomycin (VANCOCIN) 125 MG capsule, Take 1 capsule (125 mg total) by mouth 4 (four) times daily for 12 days, THEN 1 capsule (125 mg total) 2 (two) times daily for 7 days, THEN 1 capsule (125 mg total) daily for 7 days, THEN 1 capsule (125 mg total) every other day for 14 days.   venlafaxine XR (EFFEXOR-XR) 37.5 MG 24 hr capsule, Take 37.5 mg by mouth every morning.  Surgical History:  She  has a past surgical history that includes Vaginal hysterectomy (1979); Dilation and curettage of uterus; Cataract extraction w/ intraocular lens  implant, bilateral (Bilateral); Cardiac catheterization (2010); TEE without cardioversion (N/A, 06/19/2015); Cardioversion (N/A, 06/19/2015); Total knee arthroplasty (Left, 04/25/2017); Colonoscopy with propofol (N/A, 11/22/2017); biopsy (11/22/2017); Anterior approach hemi hip arthroplasty (Left, 08/05/2019); Total knee revision (Left, 12/03/2019); Total knee arthroplasty (Right, 02/18/2020); Total knee arthroplasty (Right, 06/16/2020); and ORIF ankle fracture (Right, 09/22/2020). Family History:  Her family history includes Colon cancer in her father; Other in her father; Tuberculosis in her mother. Social History:   reports that she has never smoked. She has never used smokeless tobacco. She reports that she does not drink alcohol and does not use drugs.  Current Medications, Allergies, Past Medical History, Past Surgical History, Family History and Social History were reviewed in [employer] record.  Physical Exam: BP (!) 118/58   Pulse (!) 51   Ht 5\' 5"  (1.651 m)   Wt 149 lb (67.6 kg)   BMI 24.79 kg/m  General:   Pleasant, obese, elderly female in no acute  distress Heart : Irregular, irregular Pulm: Clear anteriorly; some mild coarse/rhonchi bilateral lower lobes Abdomen:  Soft, Obese AB, Active bowel sounds. No tenderness . , No organomegaly appreciated. Rectal: Not evaluated Extremities:  without  edema, some dependent rubor. Neurologic:  Alert and  oriented x4;  No focal deficits. Walks with walker.  Psych:  Cooperative. Normal mood and affect.   , PA-C 10/25/21

## 2021-10-25 ENCOUNTER — Encounter: Payer: Self-pay | Admitting: Physician Assistant

## 2021-10-25 ENCOUNTER — Ambulatory Visit: Payer: Medicare Other | Admitting: Physician Assistant

## 2021-10-25 ENCOUNTER — Other Ambulatory Visit (INDEPENDENT_AMBULATORY_CARE_PROVIDER_SITE_OTHER): Payer: Medicare Other

## 2021-10-25 VITALS — BP 118/58 | HR 51 | Ht 65.0 in | Wt 149.0 lb

## 2021-10-25 DIAGNOSIS — I5042 Chronic combined systolic (congestive) and diastolic (congestive) heart failure: Secondary | ICD-10-CM

## 2021-10-25 DIAGNOSIS — R531 Weakness: Secondary | ICD-10-CM | POA: Diagnosis not present

## 2021-10-25 DIAGNOSIS — A0472 Enterocolitis due to Clostridium difficile, not specified as recurrent: Secondary | ICD-10-CM

## 2021-10-25 DIAGNOSIS — K52832 Lymphocytic colitis: Secondary | ICD-10-CM | POA: Diagnosis not present

## 2021-10-25 LAB — CBC WITH DIFFERENTIAL/PLATELET
Basophils Absolute: 0 10*3/uL (ref 0.0–0.1)
Basophils Relative: 0.4 % (ref 0.0–3.0)
Eosinophils Absolute: 0.2 10*3/uL (ref 0.0–0.7)
Eosinophils Relative: 2.4 % (ref 0.0–5.0)
HCT: 39.1 % (ref 36.0–46.0)
Hemoglobin: 13.2 g/dL (ref 12.0–15.0)
Lymphocytes Relative: 15.6 % (ref 12.0–46.0)
Lymphs Abs: 1.2 10*3/uL (ref 0.7–4.0)
MCHC: 33.8 g/dL (ref 30.0–36.0)
MCV: 92.2 fl (ref 78.0–100.0)
Monocytes Absolute: 0.5 10*3/uL (ref 0.1–1.0)
Monocytes Relative: 6.5 % (ref 3.0–12.0)
Neutro Abs: 6 10*3/uL (ref 1.4–7.7)
Neutrophils Relative %: 75.1 % (ref 43.0–77.0)
Platelets: 242 10*3/uL (ref 150.0–400.0)
RBC: 4.24 Mil/uL (ref 3.87–5.11)
RDW: 13.2 % (ref 11.5–15.5)
WBC: 7.9 10*3/uL (ref 4.0–10.5)

## 2021-10-25 LAB — COMPREHENSIVE METABOLIC PANEL
ALT: 10 U/L (ref 0–35)
AST: 20 U/L (ref 0–37)
Albumin: 3.9 g/dL (ref 3.5–5.2)
Alkaline Phosphatase: 80 U/L (ref 39–117)
BUN: 17 mg/dL (ref 6–23)
CO2: 24 mEq/L (ref 19–32)
Calcium: 9.4 mg/dL (ref 8.4–10.5)
Chloride: 101 mEq/L (ref 96–112)
Creatinine, Ser: 0.73 mg/dL (ref 0.40–1.20)
GFR: 73.28 mL/min (ref >60.00)
Glucose, Bld: 87 mg/dL (ref 70–99)
Potassium: 4.6 mEq/L (ref 3.5–5.1)
Sodium: 135 mEq/L (ref 135–145)
Total Bilirubin: 0.5 mg/dL (ref 0.2–1.2)
Total Protein: 6.5 g/dL (ref 6.0–8.3)

## 2021-10-25 MED ORDER — VANCOMYCIN HCL 125 MG PO CAPS: ORAL_CAPSULE | ORAL | 0 refills | Status: DC

## 2021-10-25 NOTE — Patient Instructions (Addendum)
Your provider has requested that you go to the basement level for lab work before leaving today. Press "B" on the elevator. The lab is located at the first door on the left as you exit the elevator.  .Clostridioides Difficile Infection Clostridioides difficile infection, also known as C. difficile or C. diff infection, happens when too much C. diff bacteria grows. This can cause severe diarrhea and inflammation of the colon (colitis). It is linked to recent use of antibiotic medicine. This infection can be passed from person to person (is contagious). You also may be exposed to the bacteria from contact with food, water, or surfaces that have the bacteria on them. What are the causes? Certain bacteria live in the colon and help to digest food. This infection develops when the balance of helpful bacteria in the colon changes and the C. diff bacteria grow out of control. This is often caused by taking antibiotics. What increases the risk? You may be more likely to develop this condition if you: Take certain antibiotics that kill many types of bacteria or take antibiotics for a long time. Have an extended stay in a hospital or long-term care facility. Are older than age 86. Have had a C. diff infection before or have been exposed to C. diff bacteria. Have a weakened disease-fighting system (immune system). Take a medicine to reduce stomach acid, such as a proton pump inhibitor, for a long time. Have a serious underlying condition, such as colon cancer or inflammatory bowel disease (IBD). Have had a gastrointestinal (GI) tract procedure. What are the signs or symptoms? Symptoms of this condition include: Diarrhea (three or more times a day) for several days. Fever. Loss of appetite. Nausea. Swelling, pain, cramping, or tenderness in the abdomen. How is this diagnosed? This condition is diagnosed with: Your medical history and a physical exam. Tests, which may include: A test for C. diff in  your stool (feces). Blood tests. Imaging tests, such as a CT scan of your abdomen. A procedure in which your colon is examined. This is rare. How is this treated? Treatment for this condition may include: Stopping the antibiotics that you were taking when the C. diff infection began. Do this only as told by your health care provider. Taking certain antibiotics to stop C. diff growth. Taking stool from a healthy person and placing it into your colon (fecal transplant). This may be done if the infection keeps coming back. Having surgery to remove the infected part of the colon. This is rare. Follow these instructions at home: Medicines Take over-the-counter and prescription medicines only as told by your health care provider. Take antibiotic medicine as told by your health care provider. Do not stop taking the antibiotic even if you start to feel better. Do not treat diarrhea with medicines unless your health care provider tells you to. Eating and drinking  Follow instructions from your health care provider about eating and drinking restrictions. Eat bland foods in small amounts that are easy to digest. These include bananas, applesauce, rice, lean meats, toast, and crackers. Follow instructions on replacing body fluid that has been lost (rehydrate). This may include: Drinking clear fluids, such as water, clear fruit juice that is diluted with water, and low-calorie sports drinks. Sucking on ice chips. Taking an oral rehydration solution (ORS). This drink is sold at pharmacies and retail stores. Avoid milk, caffeine, and alcohol. Drink enough fluid to keep your urine pale yellow. General instructions Wash your hands often with soap and water for at  least 20 seconds. Bathe or shower using soap and water daily. Return to your normal activities as told by your health care provider. Ask your health care provider what activities are safe for you. Be sure your home is clean before you leave the  hospital or clinic to go home. Then continue daily cleaning for at least a week. Keep all follow-up visits. This is important. How is this prevented? Hand hygiene  Wash your hands with soap and water for at least 20 seconds before preparing food and after using the bathroom. Make sure the people you live with also wash their hands often with soap and water for at least 20 seconds. If you are being treated at a hospital or clinic, make sure that all health care providers and visitors wash their hands with soap and water before touching you. Contact precautions Tell your health care team right away if you develop diarrhea while in a hospital or long-term care facility. When visiting someone in a hospital or a long-term care facility, follow guidelines for wearing a gown, gloves, or other protective equipment. If possible, avoid contact with people who have diarrhea. Use a separate bathroom if you are sick and live with other people, if possible. Clean environment Clean surfaces that are touched often every day. C. diff bacteria are killed only by cleaning products that contain 10% chlorine bleach solution. Be sure to: Read the product's label to make sure the product will kill the bacteria on the surface you are cleaning. Clean frequently touched surfaces, such as toilet seats and flush handles, bathtubs, sinks, doorknobs, and work surfaces. If you are in the hospital, make sure that staff members clean the surfaces in your room daily. Let a staff person know right away if body fluids have splashed or spilled. Washing clothes and linens Use a powder laundry detergent containing chlorine bleach instead of liquid detergent. Powder detergents contain chlorine bleach in low levels to help kill bacteria. Run your empty washing machine on the hot setting once a month with enough detergent for a full load. This will kill any remaining C. diff bacteria. Contact a health care provider if: Your symptoms do  not get better, or they get worse, even with treatment. Your symptoms go away and then come back. You have a fever. You develop new symptoms. Get help right away if: You have more pain or tenderness in your abdomen. You have stool that is mostly bloody, or looks black and tarry. You cannot eat or drink without vomiting. You have signs of dehydration, such as: Dark urine, very little urine, or no urine. Cracked lips or dry mouth. Not making tears when you cry. Sunken eyes. Sleepiness. Weakness or dizziness. Summary Clostridioides difficile infection, or C. diff infection, can cause severe diarrhea and inflammation of the colon (colitis). It is linked to recent antibiotic use. C. diff infection can spread from person to person (is contagious). You also may be exposed to the bacteria from contact with food, water, or surfaces that have the bacteria on them. This infection may be treated by stopping the antibiotics you were using when the infection began. Fecal transplant or surgery may be needed for repeat or severe infections. Washing hands with soap and water for at least 20 seconds after you use the bathroom and before you eat, and cleaning surfaces with a 10% bleach solution, can help prevent or limit spread of this infection. This information is not intended to replace advice given to you by your health care  provider. Make sure you discuss any questions you have with your health care provider. Document Revised: 07/18/2019 Document Reviewed: 07/18/2019 Elsevier Patient Education  2023 [employer].  I appreciate the opportunity to care for you. Quentin Mulling, PA-C

## 2021-10-26 ENCOUNTER — Encounter: Payer: Self-pay | Admitting: Internal Medicine

## 2021-10-26 ENCOUNTER — Telehealth: Payer: Medicare Other

## 2021-10-26 ENCOUNTER — Telehealth: Payer: Self-pay

## 2021-10-26 NOTE — Telephone Encounter (Addendum)
Patient reports worsening depression on venlafaxine 37.5 mg daily. She feels mood is worse and she is more fatigued. Of note metoprolol was increased a few weeks ago which is likely contributing to fatigue. Overall pt reports she felt better on sertraline and wants to switch back.  Discussed options of increasing venlafaxine vs going back to sertraline vs trial of new medication, pt would rather go back to sertraline. Venlafaxine must be tapered slowly as withdrawals symptoms can be severe. She is on lowest dose currently and has been taking it for > 4 weeks.  Recommend taper venlafaxine: take 37.5 mg every other day x 2 weeks then stop (end date 11/09/21) Recommend restart sertraline 50 mg daily - to start 1 day after stopping venlafaxine (11/10/21). Would consider titrating sertraline dose up fairly quickly as pt was previously on 150 mg/day.  Routing to PCP for approval/advice.

## 2021-10-26 NOTE — Progress Notes (Signed)
Chronic Care Management Pharmacy Assistant   Name: [name]  MRN: [id number] DOB: May 10, 1930  Reason for Encounter: CCM (General Adherence)  Recent office visits:  None since last CCM contact  Recent consult visits:  10/25/21 Catherine Mulling, PA [name]) C. difficile diarrhea Start: vancomycin (VANCOCIN) 125 MG capsule 10/05/21 Catherine Person, NP (Cardiology) CHF Ordered: EKG Change: metoprolol succinate (TOPROL-XL) 100 MG 24 hr tablet Start: metoprolol succinate (TOPROL XL) 50 MG 24 hr tablet Stop (patient) Menthol-Methyl Salicylate (SALONPAS PAIN RELIEF PATCH EX) Stop (patient) pantoprazole (PROTONIX) 40 MG tablet Stop (completed) traMADol (ULTRAM) 50 MG tablet FU 3 - 4 months 09/23/21 Gastro Orders: Start all medications due to Abnormal labs Start: vancomycin (VANCOCIN) 125 MG capsule Start: saccharomyces boulardii (FLORASTOR) 250 MG capsule 09/22/21 Catherine Mulling, PA [name]) Chronic Diarrhea No med changes 09/20/21 Catherine Meeker, PA [name]) Telephone: Ordered due to loose stools: GI Profile, Stool, PCR and Stool, WBC/Lactoferrin  "Patient with positive Cdiff on labs, will send in vancomycin 125gm 4 x a day for 14 days #56, add on over the counter florastor 250mg  BID for 1 month. Positive for fecal lactoferrin which is not unusual in the setting of the infection of colostrum difficile. This should improve with the medication if not or if the symptoms return after stopping the medication please notify our office immediately"   Hospital visits:  None in previous 6 months  Medications: Outpatient Encounter Medications as of 10/26/2021  Medication Sig Note   acetaminophen (TYLENOL) 500 MG tablet Take 1,000 mg by mouth in the morning and at bedtime.    apixaban (ELIQUIS) 2.5 MG TABS tablet Take 1 tablet (2.5 mg total) by mouth 2 (two) times daily.    calcium carbonate (TUMS - DOSED IN MG ELEMENTAL CALCIUM) 500 MG chewable tablet Chew 500 mg by mouth 2 (two) times daily as needed  for indigestion or heartburn.    cholecalciferol (VITAMIN D3) 25 MCG (1000 UNIT) tablet Take 1,000 Units by mouth in the morning.    cholestyramine (QUESTRAN) 4 g packet TAKE 1 PACKET (4 G TOTAL) BY MOUTH DAILY WITH SUPPER.    digoxin (LANOXIN) 0.125 MG tablet Take 0.5 tablets (0.0625 mg total) by mouth daily.    loperamide (IMODIUM) 2 MG capsule Take 2 mg by mouth 3 (three) times daily as needed for diarrhea or loose stools.     metoprolol succinate (TOPROL XL) 50 MG 24 hr tablet Take 1 tablet (50 mg total) by mouth daily. Take with or immediately following a meal.    metoprolol succinate (TOPROL-XL) 100 MG 24 hr tablet TAKE 1 TABLET BY MOUTH DAILY. TAKE WITH OR IMMEDIATELY FOLLOWING A MEAL. (Patient taking differently: Take 150 mg by mouth daily.)    Polyethyl Glycol-Propyl Glycol (SYSTANE OP) Place 1-2 drops into the left eye 3 (three) times daily as needed (dryness).    potassium chloride SA (KLOR-CON) 20 MEQ tablet Take 2 tablets (40 mEq total) by mouth daily.    torsemide (DEMADEX) 20 MG tablet Take 1 tablet (20 mg total) by mouth daily as needed. if 3 pound weight gain overnight or 5 pounds in a week. 10/25/2021: As needed   vancomycin (VANCOCIN) 125 MG capsule Take 1 capsule (125 mg total) by mouth 4 (four) times daily for 12 days, THEN 1 capsule (125 mg total) 2 (two) times daily for 7 days, THEN 1 capsule (125 mg total) daily for 7 days, THEN 1 capsule (125 mg total) every other day for 14 days.    venlafaxine  XR (EFFEXOR-XR) 37.5 MG 24 hr capsule Take 37.5 mg by mouth every morning.    vitamin B-12 (CYANOCOBALAMIN) 50 MCG tablet Take 50 mcg by mouth daily.    No facility-administered encounter medications on file as of 10/26/2021.    Contacted [name] on 10/26/2021 for general disease state and medication adherence call.   Star Medications: Medication Name/mg Last Fill Days Supply No star rating drugs noted  What concerns do you have about your medications? Patient states she  would like to speak with [name] as she does not feel the Venlafaxine HCL ER 37.5 mg is doing her any good. She stated she does not feel any better on it. She feels her depression was better on the Sertraline HCL 50 mg. Patient would like to speak with [name] to speak about switching and how to wean off of one and on to another. Patient has been scheduled to speak with [name] today at 2:15.   The patient reports the following side effects with their medications.   How often do you forget or accidentally miss a dose? Never  Do you use a pillbox? Yes  Are you having any problems getting your medications from your pharmacy? No  Has the cost of your medications been a concern? No  Since last visit with CPP, the following interventions have been made.  Start: vancomycin (VANCOCIN) 125 MG capsule Start: metoprolol succinate (TOPROL XL) 50 MG 24 hr tablet  Start: vancomycin (VANCOCIN) 125 MG capsule  Start: saccharomyces boulardii (FLORASTOR) 250 MG capsule Change: metoprolol succinate (TOPROL-XL) 100 MG 24 hr tablet  Stop (patient) Menthol-Methyl Salicylate (SALONPAS PAIN RELIEF PATCH EX)  Stop (patient) pantoprazole (PROTONIX) 40 MG tablet  Stop (completed) traMADol (ULTRAM) 50 MG tablet   The patient has not had an ED visit since last contact.   The patient reports the following problems with their health. Patient was diagnosed with C-Diff on 06/15.  Patient reports the following concerns or questions for [name], PharmD at this time. Please see above.   Care Gaps: Annual wellness visit in last year? Yes 07/12/21 Most Recent BP reading: 118/58 on 10/25/2021  Upcoming appointments: Gastro appointment on 12/02/2021 CCM appointment on 12/10/2021  [name], CPP notified  [name], [place] Clinical Pharmacy Assistant [phone]

## 2021-10-27 ENCOUNTER — Other Ambulatory Visit: Payer: Self-pay

## 2021-10-27 ENCOUNTER — Encounter: Payer: Self-pay | Admitting: Family Medicine

## 2021-10-27 DIAGNOSIS — A0472 Enterocolitis due to Clostridium difficile, not specified as recurrent: Secondary | ICD-10-CM

## 2021-10-27 NOTE — Telephone Encounter (Signed)
Agreed, thanks.  Does she have sertraline leftover?  Do I need to send a new rx?  Please let me know.  Thanks.

## 2021-10-27 NOTE — Telephone Encounter (Signed)
Will evaluate patient at office visit

## 2021-10-27 NOTE — Telephone Encounter (Signed)
Noted. Thanks. Agreed.  

## 2021-10-27 NOTE — Telephone Encounter (Signed)
I called her to clarify.  Despite indicating she was having diarrhea at the visit 2 days ago it seems, she says she has not been having diarrhea and she is having formed stools.  I have told her to stop the vancomycin.  I think it is unlikely that is causing her urinary issues.  If they persist she needs to see primary care or whoever is taking care of those.

## 2021-10-27 NOTE — Telephone Encounter (Signed)
Spoke to patient by telephone and was advised that she  is having to urinate a lot. Patient stated that she is sure that she has a UTI. Patient stated the symptoms started about 2 weeks ago and is getting worse. Patient stated that she was on an antibiotic for C-Diff but was not able to take it because it made her sick. Patient scheduled for an  appointment with Audria Nine NP 10/28/21 at 12:20 pm. Patient was given ER precautions and she verbalized understanding.

## 2021-10-28 ENCOUNTER — Other Ambulatory Visit: Payer: Medicare Other

## 2021-10-28 ENCOUNTER — Ambulatory Visit: Payer: Medicare Other | Admitting: Nurse Practitioner

## 2021-10-28 ENCOUNTER — Encounter: Payer: Self-pay | Admitting: Nurse Practitioner

## 2021-10-28 VITALS — BP 108/66 | HR 88 | Temp 97.3°F | Resp 14 | Ht 65.0 in | Wt 150.5 lb

## 2021-10-28 DIAGNOSIS — N3090 Cystitis, unspecified without hematuria: Secondary | ICD-10-CM | POA: Diagnosis not present

## 2021-10-28 DIAGNOSIS — R300 Dysuria: Secondary | ICD-10-CM | POA: Diagnosis not present

## 2021-10-28 LAB — POC URINALSYSI DIPSTICK (AUTOMATED)
Bilirubin, UA: NEGATIVE
Blood, UA: POSITIVE
Glucose, UA: NEGATIVE
Ketones, UA: NEGATIVE
Nitrite, UA: POSITIVE
Protein, UA: POSITIVE — AB
Spec Grav, UA: 1.015 (ref 1.010–1.025)
Urobilinogen, UA: 0.2 E.U./dL
pH, UA: 5 (ref 5.0–8.0)

## 2021-10-28 MED ORDER — CEPHALEXIN 500 MG PO CAPS: 500.0000 mg | ORAL_CAPSULE | Freq: Two times a day (BID) | ORAL | 0 refills | Status: DC

## 2021-10-28 NOTE — Assessment & Plan Note (Signed)
Negative both urinary tract infection.  Treat with Keflex 500 mg twice daily for 7 days.

## 2021-10-28 NOTE — Patient Instructions (Signed)
Nice to see you today I will be in touch with the urine culture Follow up if no improvement or symptoms worsen

## 2021-10-28 NOTE — Assessment & Plan Note (Signed)
UA indicative of urinary tract infection.  We will start patient on Keflex 500 mg twice daily for 7 days.  Pending urinary culture follow-up if no improvement or symptoms worsen

## 2021-10-28 NOTE — Progress Notes (Signed)
Acute Office Visit  Subjective:     Patient ID: [name], female    DOB: 09-18-1930, 88 y.o.   MRN: [id number]  Chief Complaint  Patient presents with   burning with urination    X 1 to 2 weeks. Urgency to urinate and small output of urine.    HPI Patient is in today for   Symptoms started approx 2 weeks ago. States that she does wear Eaton pad that she has to change throughout the day.  She is not sure if that is contributing to her symptoms.  Patient does have Eaton history of urinary tract infections in the past.  States this feels similar.  She endorses dysuria, frequency, urgency.    Review of Systems  Constitutional:  Negative for chills and fever.  Gastrointestinal:  Positive for abdominal pain and nausea. Negative for vomiting.  Genitourinary:  Positive for dysuria, frequency and urgency. Negative for hematuria.  Musculoskeletal:  Positive for back pain.        Objective:    BP 108/66   Pulse 88   Temp (!) 97.3 F (36.3 C)   Resp 14   Ht 5\' 5"  (1.651 m)   Wt 150 lb 8 oz (68.3 kg)   SpO2 99%   BMI 25.04 kg/m    Physical Exam Vitals and nursing note reviewed.  Constitutional:      Appearance: Normal appearance.  Cardiovascular:     Rate and Rhythm: Normal rate. Rhythm irregular.     Pulses: Normal pulses.     Heart sounds: Normal heart sounds.  Pulmonary:     Effort: Pulmonary effort is normal.     Breath sounds: Normal breath sounds.  Abdominal:     General: Bowel sounds are normal. There is no distension.     Palpations: There is no mass.     Tenderness: There is abdominal tenderness. There is no right CVA tenderness or left CVA tenderness.     Hernia: No hernia is present.       Comments: Tenderness to palpation  Neurological:     Mental Status: She is alert.     Results for orders placed or performed in visit on 10/28/21  POCT Urinalysis Dipstick (Automated)  Result Value Ref Range   Color, UA yellow    Clarity, UA cloudy    Glucose,  UA Negative Negative   Bilirubin, UA negative    Ketones, UA negative    Spec Grav, UA 1.015 1.010 - 1.025   Blood, UA positive-3+    pH, UA 5.0 5.0 - 8.0   Protein, UA Positive (Eaton) Negative   Urobilinogen, UA 0.2 0.2 or 1.0 E.U./dL   Nitrite, UA positive    Leukocytes, UA Moderate (2+) (Eaton) Negative        Assessment & Plan:   Problem List Items Addressed This Visit       Genitourinary   Cystitis    UA indicative of urinary tract infection.  We will start patient on Keflex 500 mg twice daily for 7 days.  Pending urinary culture follow-up if no improvement or symptoms worsen      Relevant Medications   cephALEXin (KEFLEX) 500 MG capsule     Other   Burning with urination - Primary    Negative both urinary tract infection.  Treat with Keflex 500 mg twice daily for 7 days.      Relevant Orders   POCT Urinalysis Dipstick (Automated) (Completed)   Urine Culture  Meds ordered this encounter  Medications   cephALEXin (KEFLEX) 500 MG capsule    Sig: Take 1 capsule (500 mg total) by mouth 2 (two) times daily.    Dispense:  14 capsule    Refill:  0    Order Specific Question:   Supervising Provider    Answer:   [name] [[id number]]    Return if symptoms worsen or fail to improve.  Catherine Nine, NP

## 2021-10-29 ENCOUNTER — Other Ambulatory Visit: Payer: Medicare Other

## 2021-10-29 DIAGNOSIS — A0472 Enterocolitis due to Clostridium difficile, not specified as recurrent: Secondary | ICD-10-CM

## 2021-10-29 MED ORDER — SERTRALINE HCL 50 MG PO TABS: 50.0000 mg | ORAL_TABLET | Freq: Every day | ORAL | 0 refills | Status: DC

## 2021-10-29 NOTE — Addendum Note (Signed)
Addended by: Kathyrn Sheriff on: 10/29/2021 04:44 PM   Modules accepted: Orders

## 2021-10-30 LAB — URINE CULTURE
MICRO NUMBER:: 13673143
SPECIMEN QUALITY:: ADEQUATE

## 2021-10-31 LAB — CLOSTRIDIUM DIFFICILE BY PCR: Toxigenic C. Difficile by PCR: NEGATIVE

## 2021-11-03 ENCOUNTER — Telehealth: Payer: Self-pay

## 2021-11-03 NOTE — Progress Notes (Signed)
    Chronic Care Management Pharmacy Assistant   Name: [name]  MRN: [id number] DOB: [birth year]  Reason for Encounter: CCM (Rescheduled Appointment)   Medications: Outpatient Encounter Medications as of 11/03/2021  Medication Sig Note   acetaminophen (TYLENOL) 500 MG tablet Take 1,000 mg by mouth in the morning and at bedtime.    apixaban (ELIQUIS) 2.5 MG TABS tablet Take 1 tablet (2.5 mg total) by mouth 2 (two) times daily.    calcium carbonate (TUMS - DOSED IN MG ELEMENTAL CALCIUM) 500 MG chewable tablet Chew 500 mg by mouth 2 (two) times daily as needed for indigestion or heartburn.    cephALEXin (KEFLEX) 500 MG capsule Take 1 capsule (500 mg total) by mouth 2 (two) times daily.    cholecalciferol (VITAMIN D3) 25 MCG (1000 UNIT) tablet Take 1,000 Units by mouth in the morning.    cholestyramine (QUESTRAN) 4 g packet TAKE 1 PACKET (4 G TOTAL) BY MOUTH DAILY WITH SUPPER.    digoxin (LANOXIN) 0.125 MG tablet Take 0.5 tablets (0.0625 mg total) by mouth daily.    loperamide (IMODIUM) 2 MG capsule Take 2 mg by mouth 3 (three) times daily as needed for diarrhea or loose stools.     metoprolol succinate (TOPROL XL) 50 MG 24 hr tablet Take 1 tablet (50 mg total) by mouth daily. Take with or immediately following a meal.    metoprolol succinate (TOPROL-XL) 100 MG 24 hr tablet TAKE 1 TABLET BY MOUTH DAILY. TAKE WITH OR IMMEDIATELY FOLLOWING A MEAL. (Patient taking differently: Take 150 mg by mouth daily.)    Polyethyl Glycol-Propyl Glycol (SYSTANE OP) Place 1-2 drops into the left eye 3 (three) times daily as needed (dryness).    potassium chloride SA (KLOR-CON) 20 MEQ tablet Take 2 tablets (40 mEq total) by mouth daily.    sertraline (ZOLOFT) 50 MG tablet Take 1 tablet (50 mg total) by mouth daily. Start 1 day after last dose of venlafaxine.    torsemide (DEMADEX) 20 MG tablet Take 1 tablet (20 mg total) by mouth daily as needed. if 3 pound weight gain overnight or 5 pounds in a week.  10/25/2021: As needed   venlafaxine XR (EFFEXOR-XR) 37.5 MG 24 hr capsule Take 37.5 mg by mouth every morning. 10/28/2021: Takes it every other day   vitamin B-12 (CYANOCOBALAMIN) 50 MCG tablet Take 50 mcg by mouth daily.    No facility-administered encounter medications on file as of 11/03/2021.    Patient has been rescheduled for her 3 month follow up (from 09/09/21) for 01/06/22 at 11:00.   Patient stated she is not having any issues or concerns at this time.    CCM referral has been placed prior to visit?  No    Star Rating Drugs: Medication:  Last Fill: Day Supply No star rating drugs noted  Al Corpus, CPP notified  Claudina Lick, [place] Clinical Pharmacy Assistant [phone]

## 2021-11-09 ENCOUNTER — Encounter: Payer: Self-pay | Admitting: Family Medicine

## 2021-11-10 ENCOUNTER — Telehealth: Payer: Medicare Other

## 2021-11-10 MED ORDER — FLUTICASONE PROPIONATE 50 MCG/ACT NA SUSP: 2.0000 | Freq: Every day | NASAL | 6 refills | Status: DC

## 2021-11-10 NOTE — Telephone Encounter (Signed)
Called patient and went over her medications. Everything is up to date. Patient states she is taking the venlafaxine QD. She states she will stay on that since she is feeling much better.

## 2021-11-10 NOTE — Telephone Encounter (Signed)
Noted. Thanks.

## 2021-11-10 NOTE — Telephone Encounter (Signed)
Please call patient.  Please verify her current med list especially regarding potential venlafaxine versus sertraline use and dose.  If she feels like her current medication/dose with venlafaxine is helping then I would continue as is.  Thanks.

## 2021-11-22 DIAGNOSIS — G5601 Carpal tunnel syndrome, right upper limb: Secondary | ICD-10-CM | POA: Diagnosis not present

## 2021-12-02 ENCOUNTER — Ambulatory Visit: Payer: Medicare Other | Admitting: Internal Medicine

## 2021-12-10 ENCOUNTER — Telehealth: Payer: Medicare Other

## 2021-12-18 ENCOUNTER — Other Ambulatory Visit: Payer: Self-pay | Admitting: Cardiovascular Disease

## 2021-12-20 NOTE — Telephone Encounter (Signed)
Rx(s) sent to pharmacy electronically.  

## 2022-01-03 ENCOUNTER — Telehealth: Payer: Self-pay

## 2022-01-03 NOTE — Progress Notes (Signed)
    Chronic Care Management Pharmacy Assistant   Name: [name]  MRN: [id number] DOB: 03/01/1931  Reason for Encounter: CCM (Appointment Reminder)  Medications: Outpatient Encounter Medications as of 01/03/2022  Medication Sig Note   acetaminophen (TYLENOL) 500 MG tablet Take 1,000 mg by mouth in the morning and at bedtime.    apixaban (ELIQUIS) 2.5 MG TABS tablet Take 1 tablet (2.5 mg total) by mouth 2 (two) times daily.    calcium carbonate (TUMS - DOSED IN MG ELEMENTAL CALCIUM) 500 MG chewable tablet Chew 500 mg by mouth 2 (two) times daily as needed for indigestion or heartburn.    cholecalciferol (VITAMIN D3) 25 MCG (1000 UNIT) tablet Take 1,000 Units by mouth in the morning.    cholestyramine (QUESTRAN) 4 g packet TAKE 1 PACKET (4 G TOTAL) BY MOUTH DAILY WITH SUPPER.    digoxin (LANOXIN) 0.125 MG tablet Take 0.5 tablets (0.0625 mg total) by mouth daily.    fluticasone (FLONASE) 50 MCG/ACT nasal spray Place 2 sprays into both nostrils daily.    KLOR-CON M20 20 MEQ tablet TAKE 2 TABLETS BY MOUTH DAILY    loperamide (IMODIUM) 2 MG capsule Take 2 mg by mouth 3 (three) times daily as needed for diarrhea or loose stools.     metoprolol succinate (TOPROL XL) 50 MG 24 hr tablet Take 1 tablet (50 mg total) by mouth daily. Take with or immediately following a meal.    metoprolol succinate (TOPROL-XL) 100 MG 24 hr tablet TAKE 1 TABLET BY MOUTH DAILY. TAKE WITH OR IMMEDIATELY FOLLOWING A MEAL. (Patient taking differently: Take 150 mg by mouth daily.)    Polyethyl Glycol-Propyl Glycol (SYSTANE OP) Place 1-2 drops into the left eye 3 (three) times daily as needed (dryness).    torsemide (DEMADEX) 20 MG tablet Take 1 tablet (20 mg total) by mouth daily as needed. if 3 pound weight gain overnight or 5 pounds in a week. 10/25/2021: As needed   venlafaxine XR (EFFEXOR-XR) 37.5 MG 24 hr capsule Take 37.5 mg by mouth every morning. 10/28/2021: Takes it every other day   vitamin B-12 (CYANOCOBALAMIN) 50  MCG tablet Take 50 mcg by mouth daily.    No facility-administered encounter medications on file as of 01/03/2022.   [name] was contacted to remind of upcoming telephone visit with Charlene Brooke on 01/06/2022 at 11:00. Patient was reminded to have any blood glucose and blood pressure readings available for review at appointment.   Patient confirmed appointment.  Are you having any problems with your medications? No   Do you have any concerns you like to discuss with the pharmacist? No  CCM referral has been placed prior to visit?  No   Star Rating Drugs: Medication:  Last Fill: Day Supply No star rating drugs  Charlene Brooke, CPP notified  Marijean Niemann, [place] Clinical Pharmacy Assistant [phone]

## 2022-01-06 ENCOUNTER — Encounter: Payer: Self-pay | Admitting: Family Medicine

## 2022-01-06 ENCOUNTER — Ambulatory Visit: Payer: Medicare Other | Admitting: Pharmacist

## 2022-01-06 DIAGNOSIS — F339 Major depressive disorder, recurrent, unspecified: Secondary | ICD-10-CM

## 2022-01-06 DIAGNOSIS — I4891 Unspecified atrial fibrillation: Secondary | ICD-10-CM

## 2022-01-06 DIAGNOSIS — I5042 Chronic combined systolic (congestive) and diastolic (congestive) heart failure: Secondary | ICD-10-CM

## 2022-01-06 NOTE — Progress Notes (Signed)
Chronic Care Management Pharmacy Note  01/06/2022 Name:  [name] MRN:  [id number] DOB:  Dec 13, 1930  Summary: CCM F/U visit -HF: pt reports increased fluid retention lately; she typically takes torsemide about twice a week (prescribed PRN for 1-2# wt gain) and wonders if she should start taking it on a scheduled basis; she has cardiology appt next week -Depression: Pt reports venlafaxine 37.5 mg daily is not as effective, she reports feeling more depressed  Recommendations/Changes made from today's visit: -Advised to discuss fluid retention/torsemide dose with cardiologist next week -Trial of venlafaxine 75 mg (2 tablets) daily; advised pt to call office with update within 4 weeks  Plan: -[place] will call patient 1 month for depression update -Pharmacist follow up televisit scheduled for 3 months -Cardiology appt 01/12/22   Subjective: [name] is an 88 y.o. year old female who is a primary patient of Damita Dunnings, Elveria Rising, MD.  The CCM team was consulted for assistance with disease management and care coordination needs.    Engaged with patient by telephone for follow up visit in response to provider referral for pharmacy case management and/or care coordination services.   Consent to Services:  The patient was given information about Chronic Care Management services, agreed to services, and gave verbal consent prior to initiation of services.  Please see initial visit note for detailed documentation.   Patient Care Team: [name], MD as PCP - General Croitoru, Dani Gobble, MD as PCP - Cardiology (Cardiology) Shon Hough, MD as Consulting Physician (Ophthalmology) Eula Listen, DDS as Referring Physician (Dentistry) Melrose Nakayama, MD as Consulting Physician (Orthopedic Surgery) Charlton Haws, [facility] as Pharmacist (Pharmacist)  Patient lives at home with her husband. She is currently using a walker to get around after knee and ankle surgeries  earlier this year.  Recent office visits: 10/28/21 NP Romilda Garret OV: UTI sx - rx'd Keflex. 10/26/21 pt call - taper venlafaxine and restart sertraline 50 mg.  06/18/21 Dr Damita Dunnings OV: f/u depression. Pt is now off bupropion, doing ok with sertraline 150 mg but still mood is down, fatigued. Taper sertraline over 4 weeks, then switch to Venlafaxine.  05/04/21 Dr Damita Dunnings OV: chronic f/u; try Flonase for hoarse voice. Consider med options for depression (on sertraline, bupropion now). Increase sertraline to 3 tablets (150 mg) daily. 03/26/21 Eugenia Pancoast NP OV: UTI. Rx Augmentin x 10 days 01/19/2021 - Elsie Stain, MD - Patient Message - Start Keflex d/t dysuria. 10/15/2020 - Elsie Stain, MD - F/U R ankle fx/SNF palcement. In wheelchair, not weight-baring. Consulted w/ Dr Oval Linsey re: torsemide dosing given inability to weigh at home - advised torsemide twice a week and repeat BMP in a few weeks. Labs: CBC and BMP. Stop due to patient not taking: methocarbamol, prednisone, tramadol  Recent consult visits: 7/18 pt message (GI): stop vancomycin 10/25/21 PA Vicie Mutters (GI): Cdiff initially improved but UTI abx led to worsening again. Re-treat with vancomycin pulse/taper.  10/05/21 NP Diona Browner (Cardiology): HF - HR 111. Increase metoprolol to 150 mg. D/C pantoprazole, tramadol (not taking).  09/22/21 PA Vicie Mutters (GI): acute on chronic diarrhea. Order KUB and stool studies. May need to change venlafaxine. Positive Cdfiff - started vancomycin.  03/10/2021 - Sanda Klein, MD - Cardiology - F/U Afib, HF. Orders: Digoxin level (with next PCP visit) and EKG. No medication changes. 03/09/2021 - Ellouise Newer, PA - Gastroenterology - Patient presented for IBS-D, Lymphocytic colitis. Start: Budesonide 9 mg x 6 weeks, then taper down  to 6 mg x 2 weeks, then 3 mg x 2 weeks 12/15/2020 - [name], MD - Cardiology - Patient presented for left bundle branch block. Start: pantoprazole (PROTONIX) 40  MG tablet due to GERD, cough and hoarseness. Stop losartan (low BP, fatigue)   Hospital visits: Medication Reconciliation was completed by comparing discharge summary, patient's EMR and Pharmacy list, and upon discussion with patient.   Admitted to the hospital on 09/22/2020 due to Ankle surgery s/p fracture. Discharge date was 09/24/2020. Discharged from [place]?Medications Started at [facility] Discharge:?? -started methocarbamol 500 MG tablet PRN -started tramadol 50 mg PRN    Medications that remain the same after Hospital Discharge:??  -All other medications will remain the same.     Admission 06/23/20-06/25/20: acute HF exacerbation - 10 lb wt gain after knee surgery. ECHO EF 25-30%; diuresed 4.5 L. Started Farxiga 10 mg daily, Start Losartan 25 mg, plan to transition to Entresto and spironolactone.  Admission 06/16/20-06/17/20: knee surgery repair.  Objective:  Lab Results  Component Value Date   CREATININE 0.73 10/25/2021   BUN 17 10/25/2021   GFR 73.28 10/25/2021   GFRNONAA >60 09/22/2020   GFRAA 83 05/28/2020   NA 135 10/25/2021   K 4.6 10/25/2021   CALCIUM 9.4 10/25/2021   CO2 24 10/25/2021   GLUCOSE 87 10/25/2021    Lab Results  Component Value Date/Time   HGBA1C 5.4 09/22/2020 11:10 AM   HGBA1C 5.4 06/24/2020 12:19 AM   GFR 73.28 10/25/2021 11:54 AM   GFR 66.70 09/22/2021 12:15 PM    Last diabetic Eye exam: No results found for: "HMDIABEYEEXA"  Last diabetic Foot exam: No results found for: "HMDIABFOOTEX"   Lab Results  Component Value Date   CHOL 144 05/04/2021   HDL 60.50 05/04/2021   LDLCALC 54 05/04/2021   LDLDIRECT 58.0 12/27/2018   TRIG 143.0 05/04/2021   CHOLHDL 2 05/04/2021       Latest Ref Rng & Units 10/25/2021   11:54 AM 09/22/2021   12:15 PM 06/24/2020   12:19 AM  Hepatic Function  Total Protein 6.0 - 8.3 g/dL 6.5  6.6  5.8   Albumin 3.5 - 5.2 g/dL 3.9  3.9  3.1   AST 0 - 37 U/L _0 ALT 0 - 35 U/L _1 Alk Phosphatase 39 - 117 U/L 80  87  73   Total Bilirubin 0.2 - 1.2 mg/dL 0.5  0.6  1.6   Bilirubin, Direct 0.0 - 0.2 mg/dL   0.5     Lab Results  Component Value Date/Time   TSH 3.65 05/04/2021 01:30 PM   TSH 4.356 06/24/2020 12:19 AM   TSH 2.53 10/29/2019 12:36 PM       Latest Ref Rng & Units 10/25/2021   11:54 AM 09/22/2021   12:15 PM 05/04/2021    1:30 PM  CBC  WBC 4.0 - 10.5 K/uL 7.9  11.2  5.8   Hemoglobin 12.0 - 15.0 g/dL 13.2  13.8  13.5   Hematocrit 36.0 - 46.0 % 39.1  41.8  41.7   Platelets 150.0 - 400.0 K/uL 242.0  247.0  180.0     Lab Results  Component Value Date/Time   VD25OH 36.99 03/28/2019 12:25 PM   VD25OH 14.37 (L) 12/27/2018 04:57 PM    Clinical ASCVD: Yes  The ASCVD Risk score (Arnett DK, et al., 2019) failed to calculate for the following reasons:  The 2019 ASCVD risk score is only valid for ages 68 to 89   The patient has a prior MI or stroke diagnosis       07/12/2021   12:01 PM 06/18/2021   11:53 AM 01/11/2019    2:30 PM  Depression screen PHQ 2/9  Decreased Interest _0 Down, Depressed, Hopeless _1 PHQ - 2 Score _2 Altered sleeping 0 3   Tired, decreased energy 1 3   Change in appetite 0 3   Feeling bad or failure about yourself  0 0   Trouble concentrating 0 1   Moving slowly or fidgety/restless 0 3   Suicidal thoughts 0 1   PHQ-9 Score 3 20   Difficult doing work/chores Not difficult at all Very difficult      CHA2DS2/VAS Stroke Risk Points  Current as of about an hour ago     6 >= 2 Points: High Risk  1 - 1.99 Points: Medium Risk  0 Points: Low Risk    Last Change: N/A      Points Metrics  1 Has Congestive Heart Failure:  Yes    Current as of about an hour ago  1 Has Vascular Disease:  Yes     Current as of about an hour ago  1 Has Hypertension:  Yes    Current as of about an hour ago  2 Age:  89    Current as of about an hour ago  0 Has Diabetes:  No    Current as of about an hour ago  0 Had Stroke:  No  Had  TIA:  No  Had Thromboembolism:  No    Current as of about an hour ago  1 Female:  Yes    Current as of about an hour ago      Social History   Tobacco Use  Smoking Status Never  Smokeless Tobacco Never   BP Readings from Last 3 Encounters:  10/28/21 108/66  10/25/21 (!) 118/58  10/05/21 110/70   Pulse Readings from Last 3 Encounters:  10/28/21 88  10/25/21 (!) 51  10/05/21 (!) 111   Wt Readings from Last 3 Encounters:  10/28/21 150 lb 8 oz (68.3 kg)  10/25/21 149 lb (67.6 kg)  10/05/21 148 lb 6.4 oz (67.3 kg)   BMI Readings from Last 3 Encounters:  10/28/21 25.04 kg/m  10/25/21 24.79 kg/m  10/05/21 24.70 kg/m    Assessment/Interventions: Review of patient past medical history, allergies, medications, health status, including review of consultants reports, laboratory and other test data, was performed as part of comprehensive evaluation and provision of chronic care management services.   SDOH:  (Social Determinants of Health) assessments and interventions performed: No - done 07/2021  SDOH Interventions    Flowsheet Row Clinical Support from 07/12/2021 in [place] at [facility] to Hosp-Admission (Discharged) from 06/23/2020 in [facility] PCU  SDOH Interventions    Food Insecurity Interventions Intervention Not Indicated Intervention Not Indicated  Housing Interventions Intervention Not Indicated Intervention Not Indicated  Transportation Interventions Intervention Not Indicated Intervention Not Indicated  Depression Interventions/Treatment  Medication --  Financial Strain Interventions Intervention Not Indicated Intervention Not Indicated  Physical Activity Interventions Other (Comments)  [Encouraged pt to try chair exercises.] Intervention Not Indicated  Stress Interventions Provide Counseling  [Discussed Counseling services but pt declined at this time.] --  Social Connections Interventions Intervention Not Indicated --  SDOH  Screenings   Food Insecurity: No Food Insecurity (07/12/2021)  Housing: Low Risk  (07/12/2021)  Transportation Needs: No Transportation Needs (07/12/2021)  Alcohol Screen: Low Risk  (07/12/2021)  Depression (PHQ2-9): Low Risk  (07/12/2021)  Recent Concern: Depression (PHQ2-9) - Medium Risk (06/18/2021)  Financial Resource Strain: Low Risk  (07/12/2021)  Physical Activity: Insufficiently Active (07/12/2021)  Social Connections: Socially Integrated (07/12/2021)  Stress: Stress Concern Present (07/12/2021)  Tobacco Use: Low Risk  (10/28/2021)    CCM Care Plan  Allergies  Allergen Reactions   Ace Inhibitors Cough   Warfarin Sodium Other (See Comments)    DOSE RELATED PHARMACOLOGIC EFFECT "bleed out"   Famotidine Other (See Comments)    GI upset   Vancomycin Nausea Only   Codeine Other (See Comments)    sedation   Delsym [Dextromethorphan Polistirex Er] Other (See Comments)    dizziness   Lactose Intolerance (Gi) Diarrhea   Lasix [Furosemide] Diarrhea    Oral Lasix gives her diarrhea, tolerates IV Rx   Phenylephrine Palpitations and Other (See Comments)    Nasal spray- "likely increase in nasal congestion".    Sulfamethoxazole-Trimethoprim Nausea And Vomiting    GI intolerance.    Medications Reviewed Today     Reviewed by Charlton Haws, [facility] (Pharmacist) on 01/06/22 at 1142  Med List Status: <None>   Medication Order Taking? Sig Documenting Provider Last Dose Status Informant  acetaminophen (TYLENOL) 500 MG tablet [id number] Yes Take 1,000 mg by mouth in the morning and at bedtime. [provider] Taking Active Self  apixaban (ELIQUIS) 2.5 MG TABS tablet [id number] Yes Take 1 tablet (2.5 mg total) by mouth 2 (two) times daily. Croitoru, Mihai, MD Taking Active   calcium carbonate (TUMS - DOSED IN MG ELEMENTAL CALCIUM) 500 MG chewable tablet [id number] Yes Chew 500 mg by mouth 2 (two) times daily as needed for indigestion or heartburn. [provider] Taking Active Self   cholecalciferol (VITAMIN D3) 25 MCG (1000 UNIT) tablet [id number] Yes Take 1,000 Units by mouth in the morning. [provider] Taking Active Self  cholestyramine (QUESTRAN) 4 g packet [id number] Yes TAKE 1 PACKET (4 G TOTAL) BY MOUTH DAILY WITH SUPPER. Gatha Mayer, MD Taking Active   digoxin (LANOXIN) 0.125 MG tablet [id number] Yes Take 0.5 tablets (0.0625 mg total) by mouth daily. [name], MD Taking Active   fluticasone [facility]) 50 MCG/ACT nasal spray [id number] Yes Place 2 sprays into both nostrils daily. [name], MD Taking Active   KLOR-CON M20 20 MEQ tablet [id number] Yes TAKE 2 TABLETS BY MOUTH DAILY [name], MD Taking Active   loperamide (IMODIUM) 2 MG capsule [id number] Yes Take 2 mg by mouth 3 (three) times daily as needed for diarrhea or loose stools.  [provider] Taking Active Self  metoprolol succinate (TOPROL XL) 50 MG 24 hr tablet [id number] Yes Take 1 tablet (50 mg total) by mouth daily. Take with or immediately following a meal. Monge, Helane Gunther, NP Taking Active   metoprolol succinate (TOPROL-XL) 100 MG 24 hr tablet [id number] Yes TAKE 1 TABLET BY MOUTH DAILY. TAKE WITH OR IMMEDIATELY FOLLOWING A MEAL.  Patient taking differently: Take 150 mg by mouth daily.   Croitoru, Mihai, MD Taking Active   Polyethyl Glycol-Propyl Glycol (SYSTANE OP) [id number] Yes Place 1-2 drops into the left eye 3 (three) times daily as needed (dryness). [provider] Taking Active Self  torsemide (DEMADEX) 20 MG tablet [id number] Yes Take 1 tablet (20 mg total) by  mouth daily as needed. if 3 pound weight gain overnight or 5 pounds in a week. Katherine Roan, MD Taking Active            Med Note (RATLIFF, THURSHELL   Mon Oct 25, 2021 10:59 AM) As needed  venlafaxine XR (EFFEXOR-XR) 37.5 MG 24 hr capsule [id number] Yes Take 37.5 mg by mouth every morning. [provider] Taking Active            Med Note Rolan Bucco Jan 06, 2022  11:23 AM)    vitamin B-12 (CYANOCOBALAMIN) 50 MCG tablet [id number] Yes Take 50 mcg by mouth daily. 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 12/03/2019   Left knee pain 12/03/2019   Moderate to severe pulmonary hypertension ([place]) 11/26/2019   Tremor of both hands 09/29/2019   Burning with urination 08/06/2019   Severe tricuspid regurgitation    S/P hip hemiarthroplasty 08/04/2019   Chronic diarrhea 09/07/2017   Hoarseness of voice 09/07/2017   Total knee replacement status, left 04/25/17 05/06/2017   Primary osteoarthritis of left knee 04/25/2017   DJD (degenerative joint disease) 04/25/2017   Healthcare maintenance 02/08/2017   Vitamin D deficiency 02/08/2017   Microscopic colitis 09/27/2016   IBS (irritable bowel syndrome) 08/31/2016   At risk for falling 01/07/2016   Cystitis 07/10/2015   Chronic combined systolic and diastolic heart failure (HCC)    Rectus sheath hematoma- no anticoagulation    Atrial fibrillation with RVR- CHADs VASc=4 06/18/2015   Diarrhea 06/12/2015   Advance care planning 10/23/2014   Leg edema 06/27/2012   Medicare annual wellness visit, subsequent 03/27/2012   Depression 03/05/2012   EDEMA 06/07/2010   Osteoporosis 05/23/2010   KNEE PAIN, LEFT 03/02/2010   LBBB (left bundle branch block) 10/28/2008   POST MI SEPTAL DEFECT 06/24/2008   Cough 06/24/2008   Nonischemic cardiomyopathy ([place]) 05/30/2008   ATRIAL FIBRILLATION 05/30/2008   HYPONATREMIA, HX OF 05/30/2008    Immunization History  Administered Date(s)  Administered   Fluad Quad(high Dose 65+) 12/14/2018, 01/23/2020   Influenza Whole 12/10/2009, 01/01/2012   Influenza,inj,Quad PF,6+ Mos 12/30/2013, 01/08/2015, 12/30/2015, 12/30/2016, 01/04/2018   Influenza-Unspecified 01/04/2021   PFIZER(Purple Top)SARS-COV-2 Vaccination 05/04/2019, 05/25/2019, 01/12/2020   Pneumococcal Conjugate-13 10/23/2014   Pneumococcal Polysaccharide-23 04/11/2006   Td 05/13/2010   Zoster Recombinat (Shingrix) 01/24/2019, 04/03/2019   Zoster, Live 04/11/2006    Conditions to be addressed/monitored:  Hypertension, Hyperlipidemia, Atrial Fibrillation, Heart Failure, Coronary Artery Disease, Depression, Osteoporosis, and Overactive Bladder  Care Plan : [place]  Updates made by Charlton Haws, [place] since 01/06/2022 12:00 AM     Problem: Hypertension, Hyperlipidemia, Atrial Fibrillation, Heart Failure, Coronary Artery Disease, Depression, Osteoporosis, and Overactive Bladder   Priority: High     Long-Range Goal: Disease mgmt   Start Date: 03/24/2021  Expected End Date: 09/10/2022  This Visit's Progress: On track  Recent Progress: On track  Priority: High  Note:   Current Barriers:  Fluid retention  Pharmacist Clinical Goal(s):  Patient will adhere  to plan to optimize therapeutic regimen for HF as evidenced by report of adherence to recommended medication management changes through collaboration with PharmD and provider.   Interventions: 1:1 collaboration with [name], MD regarding development and update of comprehensive plan of care as evidenced by provider attestation and co-signature Inter-disciplinary care team collaboration (see longitudinal plan of care) Comprehensive medication review performed; medication list updated in electronic medical record  Hyperlipidemia: (LDL goal < 70) -Controlled - LDL 54 (04/2021) at goal; TRIG 143 -Hx MI. Noted statin intolerance in chart. -Current treatment: Cholestyramine 4 g daily PM (for  diarrhea) - Appropriate, Effective, Safe, Accessible -Educated on Cholesterol goals -Recommend to continue current medication  Persistent Atrial Fibrillation (Goal: prevent stroke and major bleeding) -Controlled - pt endorses compliance with medications and denies issues; recent digoxin level was not elevated (04/2021) -CHADSVASC: 6; chronic LBBB; hx of falls -Age 67, wt 65 kg, Cr 0.72 -Current treatment: Metoprolol succinate 100 mg daily -Appropriate, Effective, Safe, Accessible Metoprolol succinate 50 mg daily - Appropriate, Effective, Safe, Accessible Digoxin 0.125 mg - 1/2 tab daily - Appropriate, Effective, Safe, Accessible Eliquis 2.5 mg BID - Appropriate, Effective, Safe, Accessible -Medications previously tried: amiodarone -Reviewed medications; pt would like to get off of digoxin, discussed increased risk of HF exacerbation when stopping digoxin, she can discuss with cardiology -Recommended to continue current medication  Heart Failure / Hypertension (Goal: BP < 140/90, prevent exacerbations) -Not ideally controlled - pt reports retaining fluid lately, she still takes torsemide about twice a week for 1-2# wt gain but wonders if she should switch to a scheduled regimen -Pt reports exercise intolerance - gets out of breath with activity -Last ejection fraction: 25-30% (Date: 06/2020) -HF type: Combined Systolic and Diastolic; NYHA Class: I (no actitivty limitation) -Current home BP/HR readings: SBP 115-120 -Current treatment: Metoprolol succinate 100 mg daily - Appropriate, Effective, Safe, Accessible Metoprolol succinate 50 mg daily - Appropriate, Effective, Safe, Accessible Digoxin 0.125 mg - 1/2 tab daily - Appropriate, Effective, Safe, Accessible Torsemide 20 mg daily PRN - Appropriate, Effective, Safe, Accessible Potassium chloride 20 mEq - 2 tab daily - Appropriate, Effective, Safe, Accessible -Medications previously tried: [place] (weakness), Ace-inhibitor (cough), losartan  (low BP) ; cardiologist advised avoid Entresto, spironolactone due to low BP -Educated on BP goals and benefits of medications for prevention of heart attack, stroke and kidney damage;Importance of home blood pressure monitoring;   -pt would like to get off of digoxin, discussed increased risk of HF exacerbation when stopping digoxin, she can discuss with cardiology -Advised to take the torsemide for 2+ lb weight gain; advised to limit salt intake -Advised to check BP when feeling dizzy; if it is low, contact PCP or cardiologist for med adjustment -Recommend to continue current medication; pt to discuss fluid retention at cardiology appt next week 10/4  Depression (Goal: manage symptoms) -Not ideally controlled - pt reports she is off all antidepressants, she denies depressed mood or depression -PHQ9: 20 (06/2021) - severe depression -Current treatment: Venlafaxine 37.5 mg daily - Appropriate, Effective, Safe, Accessible -Medications previously tried: sertraline, bupropion -Connected with PCP for mental health support -Discussed s/sx of depression -Recommend trial of 2 tablets (42m) daily  Osteoporosis (Goal prevent fractures) -Not ideally controlled - pt has no DEXA on file and is not taking medication to improve bone density -Dx from chart review 2012. Hx hip fracture and multiple falls. Pt has declined follow up DEXA scans previously -Current treatment  Vitamin D 1000 IU daily -Assessed her history of  hip fracture - appears to be fragility fracture, which qualifies her for treatment of osteoporosis; given significant GI and reflux issues would avoid oral bisphosphonates; she is a good candidate for Prolia or zoledronic acid injections -Recommended DEXA scan to evaluate bone density - pt declined -Recommend weight-bearing and muscle strengthening exercises for building and maintaining bone density. -Advised to start a calcium supplement daily (at least 600 mg) -Consider starting Prolia  injections given hx of hip fracture, hx multiple falls   IBS-D / Lymphocytic Colitis (Goal: manage symtoms) -Controlled - pt reports diarrhea improved with cholestyramine -colitis flare as of 03/09/21; follows with GI;  -Current treatment  Cholestyramine 4 g daily - Appropriate, Effective, Safe, Accessible Loperamide 2 mg PRN -takes 2-3 per day - Appropriate, Effective, Safe, Accessible -Medications previously used: budesonide (flares), prednisone (flares) -Reviewed medications; it is reasonable to use OTC remedies at recommended doses during flare -Advised to follow up with GI for further mgmt of IBS  OAB (Goal: reduce urinary frequency) -Not ideally controlled -pt stopped taking Myrbetriq due to cost, she reports nocturia is about the same -Preferred formulary options: oxybutynin (Tier 1), tolterodine, trospium, solifenacin -Current treatment  None -Medications previously tried: oxybutynin, Myrbetriq (cost) -Advised to f/u with urology  Health Maintenance -Vaccine gaps: Covid booster, TDAP -Hand arthritis - wears brace at night, takes Tylenol -Takes melatonin 5 mg HS -Takes multivitamin  -Just started taking Benadryl at night for sleep, discussed risks of chronic benadryl including anticholinergic effects and memory deficits   Patient Goals/Self-Care Activities Patient will:  - take medications as prescribed as evidenced by patient report and record review focus on medication adherence by pill box check blood pressure when dizzy, document, and provide at future appointments weigh daily, and contact provider if weight gain of 5+ lbs in a week      Medication Assistance: None required.  Patient affirms current coverage meets needs.  Compliance/Adherence/Medication fill history: Care Gaps: None  Star-Rating Drugs: None  Medication Access: Within the past 30 days, how often has patient missed a dose of medication? 0 Is a pillbox or other method used to improve adherence?  Yes  Factors that may affect medication adherence? financial need Are meds synced by current pharmacy? No  Are meds delivered by current pharmacy? No  Does patient experience delays in picking up medications due to transportation concerns? No   [facility] Services Reviewed: Is patient disadvantaged to use [facility]?: No  Current Rx insurance plan: [facility] Name and location of Current pharmacy:  [id number] [id number]: [phone]: [phone] [facility] services reviewed with patient today?: No  Patient requests to transfer care to [facility]?: No  Reason patient declined to change pharmacies: Prefers to go out and pick up medications themselves   Care Plan and Follow Up Patient Decision:  Patient agrees to Care Plan and Follow-up.  Plan: Telephone follow up appointment with care management team member scheduled for:  3 months  LCharlene Brooke PharmD, BCACP Clinical Pharmacist [phone]

## 2022-01-07 NOTE — Patient Instructions (Signed)
Visit Information  Phone number for Pharmacist: [phone]   Goals Addressed   None     Patient Care Plan: CCM Pharmacy Care Plan     Problem Identified: Hypertension, Hyperlipidemia, Atrial Fibrillation, Heart Failure, Coronary Artery Disease, Depression, Osteoporosis, and Overactive Bladder   Priority: High     Long-Range Goal: Disease mgmt   Start Date: 03/24/2021  Expected End Date: 09/10/2022  This Visit's Progress: On track  Recent Progress: On track  Priority: High  Note:   Current Barriers:  Fluid retention  Pharmacist Clinical Goal(s):  Patient will adhere to plan to optimize therapeutic regimen for HF as evidenced by report of adherence to recommended medication management changes through collaboration with PharmD and provider.   Interventions: 1:1 collaboration with Catherine Ghent, MD regarding development and update of comprehensive plan of care as evidenced by provider attestation and co-signature Inter-disciplinary care team collaboration (see longitudinal plan of care) Comprehensive medication review performed; medication list updated in electronic medical record  Hyperlipidemia: (LDL goal < 70) -Controlled - LDL 54 (04/2021) at goal; TRIG 143 -Hx MI. Noted statin intolerance in chart. -Current treatment: Cholestyramine 4 g daily PM (for diarrhea) - Appropriate, Effective, Safe, Accessible -Educated on Cholesterol goals -Recommend to continue current medication  Persistent Atrial Fibrillation (Goal: prevent stroke and major bleeding) -Controlled - pt endorses compliance with medications and denies issues; recent digoxin level was not elevated (04/2021) -CHADSVASC: 6; chronic LBBB; hx of falls -Age 14, wt 65 kg, Cr 0.72 -Current treatment: Metoprolol succinate 100 mg daily -Appropriate, Effective, Safe, Accessible Metoprolol succinate 50 mg daily - Appropriate, Effective, Safe, Accessible Digoxin 0.125 mg - 1/2 tab daily - Appropriate, Effective, Safe,  Accessible Eliquis 2.5 mg BID - Appropriate, Effective, Safe, Accessible -Medications previously tried: amiodarone -Reviewed medications; pt would like to get off of digoxin, discussed increased risk of HF exacerbation when stopping digoxin, she can discuss with cardiology -Recommended to continue current medication  Heart Failure / Hypertension (Goal: BP < 140/90, prevent exacerbations) -Not ideally controlled - pt reports retaining fluid lately, she still takes torsemide about twice a week for 1-2# wt gain but wonders if she should switch to a scheduled regimen -Pt reports exercise intolerance - gets out of breath with activity -Last ejection fraction: 25-30% (Date: 06/2020) -HF type: Combined Systolic and Diastolic; NYHA Class: I (no actitivty limitation) -Current home BP/HR readings: SBP 115-120 -Current treatment: Metoprolol succinate 100 mg daily - Appropriate, Effective, Safe, Accessible Metoprolol succinate 50 mg daily - Appropriate, Effective, Safe, Accessible Digoxin 0.125 mg - 1/2 tab daily - Appropriate, Effective, Safe, Accessible Torsemide 20 mg daily PRN - Appropriate, Effective, Safe, Accessible Potassium chloride 20 mEq - 2 tab daily - Appropriate, Effective, Safe, Accessible -Medications previously tried: [place] (weakness), Ace-inhibitor (cough), losartan (low BP) ; cardiologist advised avoid Entresto, spironolactone due to low BP -Educated on BP goals and benefits of medications for prevention of heart attack, stroke and kidney damage;Importance of home blood pressure monitoring;   -pt would like to get off of digoxin, discussed increased risk of HF exacerbation when stopping digoxin, she can discuss with cardiology -Advised to take the torsemide for 2+ lb weight gain; advised to limit salt intake -Advised to check BP when feeling dizzy; if it is low, contact PCP or cardiologist for med adjustment -Recommend to continue current medication; pt to discuss fluid retention at  cardiology appt next week 10/4  Depression (Goal: manage symptoms) -Not ideally controlled - pt reports she is off all antidepressants, she denies  depressed mood or depression -PHQ9: 20 (06/2021) - severe depression -Current treatment: Venlafaxine 37.5 mg daily - Appropriate, Effective, Safe, Accessible -Medications previously tried: sertraline, bupropion -Connected with PCP for mental health support -Discussed s/sx of depression -Recommend trial of 2 tablets (75mg ) daily  Osteoporosis (Goal prevent fractures) -Not ideally controlled - pt has no DEXA on file and is not taking medication to improve bone density -Dx from chart review 2012. Hx hip fracture and multiple falls. Pt has declined follow up DEXA scans previously -Current treatment  Vitamin D 1000 IU daily -Assessed her history of hip fracture - appears to be fragility fracture, which qualifies her for treatment of osteoporosis; given significant GI and reflux issues would avoid oral bisphosphonates; she is a good candidate for Prolia or zoledronic acid injections -Recommended DEXA scan to evaluate bone density - pt declined -Recommend weight-bearing and muscle strengthening exercises for building and maintaining bone density. -Advised to start a calcium supplement daily (at least 600 mg) -Consider starting Prolia injections given hx of hip fracture, hx multiple falls   IBS-D / Lymphocytic Colitis (Goal: manage symtoms) -Controlled - pt reports diarrhea improved with cholestyramine -colitis flare as of 03/09/21; follows with GI;  -Current treatment  Cholestyramine 4 g daily - Appropriate, Effective, Safe, Accessible Loperamide 2 mg PRN -takes 2-3 per day - Appropriate, Effective, Safe, Accessible -Medications previously used: budesonide (flares), prednisone (flares) -Reviewed medications; it is reasonable to use OTC remedies at recommended doses during flare -Advised to follow up with GI for further mgmt of IBS  OAB (Goal:  reduce urinary frequency) -Not ideally controlled -pt stopped taking Myrbetriq due to cost, she reports nocturia is about the same -Preferred formulary options: oxybutynin (Tier 1), tolterodine, trospium, solifenacin -Current treatment  None -Medications previously tried: oxybutynin, Myrbetriq (cost) -Advised to f/u with urology  Health Maintenance -Vaccine gaps: Covid booster, TDAP -Hand arthritis - wears brace at night, takes Tylenol -Takes melatonin 5 mg HS -Takes multivitamin  -Just started taking Benadryl at night for sleep, discussed risks of chronic benadryl including anticholinergic effects and memory deficits   Patient Goals/Self-Care Activities Patient will:  - take medications as prescribed as evidenced by patient report and record review focus on medication adherence by pill box check blood pressure when dizzy, document, and provide at future appointments weigh daily, and contact provider if weight gain of 5+ lbs in a week      Patient verbalizes understanding of instructions and care plan provided today and agrees to view in [place]. Active MyChart status and patient understanding of how to access instructions and care plan via MyChart confirmed with patient.    Telephone follow up appointment with pharmacy team member scheduled for: 1 month  Charlene Brooke, PharmD, [facility] Clinical Pharmacist [facility] [phone]

## 2022-01-07 NOTE — Telephone Encounter (Signed)
Please check with patient.  I think she needs to be seen either here or at orthopedics.  Whichever one can be done sooner would be the likely best option.  Thanks.

## 2022-01-10 ENCOUNTER — Encounter: Payer: Self-pay | Admitting: Family Medicine

## 2022-01-12 ENCOUNTER — Ambulatory Visit: Payer: Medicare Other | Attending: Cardiovascular Disease | Admitting: Cardiovascular Disease

## 2022-01-12 ENCOUNTER — Encounter: Payer: Self-pay | Admitting: Cardiovascular Disease

## 2022-01-12 VITALS — BP 106/66 | HR 88 | Ht 65.0 in | Wt 153.2 lb

## 2022-01-12 DIAGNOSIS — I071 Rheumatic tricuspid insufficiency: Secondary | ICD-10-CM | POA: Diagnosis not present

## 2022-01-12 DIAGNOSIS — I447 Left bundle-branch block, unspecified: Secondary | ICD-10-CM

## 2022-01-12 DIAGNOSIS — R931 Abnormal findings on diagnostic imaging of heart and coronary circulation: Secondary | ICD-10-CM

## 2022-01-12 DIAGNOSIS — I5042 Chronic combined systolic (congestive) and diastolic (congestive) heart failure: Secondary | ICD-10-CM | POA: Diagnosis not present

## 2022-01-12 DIAGNOSIS — Z9181 History of falling: Secondary | ICD-10-CM

## 2022-01-12 DIAGNOSIS — I4821 Permanent atrial fibrillation: Secondary | ICD-10-CM | POA: Diagnosis not present

## 2022-01-12 DIAGNOSIS — D6869 Other thrombophilia: Secondary | ICD-10-CM | POA: Diagnosis not present

## 2022-01-12 DIAGNOSIS — I700 Atherosclerosis of aorta: Secondary | ICD-10-CM

## 2022-01-12 DIAGNOSIS — I2721 Secondary pulmonary arterial hypertension: Secondary | ICD-10-CM

## 2022-01-12 NOTE — Progress Notes (Signed)
Cardiology Office Note:    Date:  01/16/2022   ID:  ALFREDIA DESANCTIS, DOB Mar 19, 1931, MRN [id number]  PCP:  [name], MD   [facility] Providers Cardiologist:  Thurmon Fair, MD     Referring MD: [name], MD   Chief Complaint  Patient presents with   Congestive Heart Failure   Atrial Fibrillation     History of Present Illness:    [name] is a 88 y.o. female with a history of longstanding persistent atrial fibrillation, chronic systolic heart failure since 2019 (EF dropped to 15% in setting of A. fib with RVR during hospital stay for hip fracture) with incomplete recovery (EF 35% in 2021, 25-30% by most recent echo in 06/24/2020), pulmonary artery hypertension (estimated systolic PAP 83 mm by echo in April 2021, with severe tricuspid regurgitation and abnormal ventricular septal flattening improved to systolic PAP 38 mmHg by echo with moderate TR in 06/24/2020).  She has severe biatrial dilation and chronic LBBB.  She has a history of spontaneous retroperitoneal bleed on warfarin many years ago.  She has a history of microscopic colitis.  Has rather fragile compensation of her congestive heart failure, but has not required hospitalization with heart failure since March 2022.  She underwent elective surgery on her right shoulder with Dr. Jerl Santos in June 2022 without heart failure complications.  She only takes torsemide as needed for weight gain and shortness of breath.  She has only been requiring it occasionally, less than once a week.  Today she weighs 153 pounds on our office scale, 148 pounds on her home scale.  When she weighed 4 pounds more than that she had cough and dyspnea on exertion.  She has low energy level and has quickly.  She has not had any falls.  She is always a little hoarse.  She denies orthopnea or PND.  She has not had any chest pain or syncope or palpitations.  She has not had any bleeding on apixaban.  Rate control was achieved with a  combination of digoxin and metoprolol.  She was intolerant to [place] which caused extreme weakness so that she could barely stand up. She has a history of cough with ACE inhibitors.  Losartan 25 mg was also started during her hospitalization in March 2022, but has since been discontinued due to fatigue and low blood pressure.   At one point, amiodarone was used for rate control but this was also stopped due to side effects.  Due to poor rate control, her metoprolol dose was increased at her June appointment 250 mg daily and today rate control is good.  I do not think she has ever had a cardiac catheterization.  The nuclear stress test in 2019 when she presented with atrial fibrillation rapid ventricular response and severe reduction in LVEF showed a "potential subtle apical, distal septal and distal anteroseptal ischemia" with abnormalities on both rest and stress studies and septal dyskinesia with apex "nearly akinetic".  EF was 23%.  On my review, all these findings are readily explained by LBBB related artifact, especially in a patient with rapid rates.  Past Medical History:  Diagnosis Date   Acute combined systolic and diastolic heart failure (HCC) 05/02/2017   Anemia    Anxiety    Arthritis    "knees" (06/18/2015)   Atrial fibrillation with RVR (HCC) 06/18/2015   h/o sig bleed on coumadin   Cardiogenic shock 05/02/2017   Complication of anesthesia 2010   "w/cardiac cath; got  into a psychotic state for ~ 3 days; got me out w/Valium"   Depression    "I have it off and on; not as often as I've gotten older" (06/18/2015)   Diverticulosis    descending and sigmoid colon--Dr. Jarold Motto   DJD (degenerative joint disease) of knee    left   Dyspnea    with exertion and in morning mostly when wakes up    E coli infection    History of   GERD (gastroesophageal reflux disease)    History of blood transfusion    History of Clostridium difficile colitis    IBS (irritable bowel syndrome)     Insomnia     off chronic ambien 5mg  as of 10/11, rare/episodic use since then  DCM/CHF   LBBB (left bundle branch block)    Lymphocytic colitis 09/27/2016   Moderate to severe pulmonary hypertension (HCC) 11/26/2019   Nonischemic cardiomyopathy (HCC)    normal coronaries, EF 15-20% 04/2008, EF 50-55% 2015   Osteoporosis    on DXA 05/2010   Positive PPD    Retroperitoneal bleed    Sciatica    Right   Urinary incontinence    Occassionally    Past Surgical History:  Procedure Laterality Date   ANTERIOR APPROACH HEMI HIP ARTHROPLASTY Left 08/05/2019   Procedure: ANTERIOR APPROACH HEMI HIP ARTHROPLASTY;  Surgeon: 08/07/2019, MD;  Location: [facility] OR;  Service: Orthopedics;  Laterality: Left;   BIOPSY  11/22/2017   Procedure: BIOPSY;  Surgeon: 11/24/2017, MD;  Location: [facility] ENDOSCOPY;  Service: Endoscopy;;   CARDIAC CATHETERIZATION  2010   CARDIOVERSION N/A 06/19/2015   Procedure: CARDIOVERSION;  Surgeon: 08/19/2015, MD;  Location: [facility] ENDOSCOPY;  Service: Cardiovascular;  Laterality: N/A;   CATARACT EXTRACTION W/ INTRAOCULAR LENS  IMPLANT, BILATERAL Bilateral    COLONOSCOPY WITH PROPOFOL N/A 11/22/2017   Procedure: COLONOSCOPY WITH PROPOFOL;  Surgeon: 11/24/2017, MD;  Location: [facility] ENDOSCOPY;  Service: Endoscopy;  Laterality: N/A;   DILATION AND CURETTAGE OF UTERUS     ORIF ANKLE FRACTURE Right 09/22/2020   Procedure: RIGHT ANKLE OPEN REDUCTION INTERNAL FIXATION (ORIF);  Surgeon: 09/24/2020, MD;  Location: [facility] ORS;  Service: Orthopedics;  Laterality: Right;   TEE WITHOUT CARDIOVERSION N/A 06/19/2015   Procedure: TRANSESOPHAGEAL ECHOCARDIOGRAM (TEE);  Surgeon: 08/19/2015, MD;  Location: [facility] ENDOSCOPY;  Service: Cardiovascular;  Laterality: N/A;   TOTAL KNEE ARTHROPLASTY Left 04/25/2017   Procedure: TOTAL KNEE ARTHROPLASTY;  Surgeon: 04/27/2017, MD;  Location: [facility] OR;  Service: Orthopedics;  Laterality: Left;   TOTAL KNEE ARTHROPLASTY Right 02/18/2020   Procedure: RIGHT  TOTAL KNEE ARTHROPLASTY;  Surgeon: 13/12/2019, MD;  Location: [facility];  Service: Orthopedics;  Laterality: Right;   TOTAL KNEE ARTHROPLASTY Right 06/16/2020   Procedure: RIGHT RETINACULAR REPAIR;  Surgeon: 08/16/2020, MD;  Location: [facility]S;  Service: Orthopedics;  Laterality: Right;   TOTAL KNEE REVISION Left 12/03/2019   Procedure: LEFT TOTAL KNEE REVISION  OF POYLY EXCHANGE;  Surgeon: 12/05/2019, MD;  Location: [facility] ORS;  Service: Orthopedics;  Laterality: Left;   VAGINAL HYSTERECTOMY  1979   ovaries intact    Current Medications: Current Meds  Medication Sig   acetaminophen (TYLENOL) 500 MG tablet Take 1,000 mg by mouth in the morning and at bedtime.   apixaban (ELIQUIS) 2.5 MG TABS tablet Take 1 tablet (2.5 mg total) by mouth 2 (two) times daily.   calcium carbonate (TUMS - DOSED IN MG ELEMENTAL CALCIUM) 500 MG chewable  tablet Chew 500 mg by mouth 2 (two) times daily as needed for indigestion or heartburn.   cholecalciferol (VITAMIN D3) 25 MCG (1000 UNIT) tablet Take 1,000 Units by mouth in the morning.   cholestyramine (QUESTRAN) 4 g packet TAKE 1 PACKET (4 G TOTAL) BY MOUTH DAILY WITH SUPPER.   digoxin (LANOXIN) 0.125 MG tablet Take 0.5 tablets (0.0625 mg total) by mouth daily.   fluticasone (FLONASE) 50 MCG/ACT nasal spray Place 2 sprays into both nostrils daily.   KLOR-CON M20 20 MEQ tablet TAKE 2 TABLETS BY MOUTH DAILY   loperamide (IMODIUM) 2 MG capsule Take 2 mg by mouth 3 (three) times daily as needed for diarrhea or loose stools.    metoprolol succinate (TOPROL XL) 50 MG 24 hr tablet Take 1 tablet (50 mg total) by mouth daily. Take with or immediately following a meal.   metoprolol succinate (TOPROL-XL) 100 MG 24 hr tablet TAKE 1 TABLET BY MOUTH DAILY. TAKE WITH OR IMMEDIATELY FOLLOWING A MEAL. (Patient taking differently: Take 150 mg by mouth daily.)   torsemide (DEMADEX) 20 MG tablet Take 1 tablet (20 mg total) by mouth daily as needed. if 3 pound weight gain overnight  or 5 pounds in a week.   venlafaxine XR (EFFEXOR-XR) 37.5 MG 24 hr capsule Take 37.5 mg by mouth every morning.     Allergies:   Ace inhibitors, Warfarin sodium, Famotidine, Vancomycin, Codeine, Delsym [dextromethorphan polistirex er], Lactose intolerance (gi), Lasix [furosemide], Phenylephrine, and Sulfamethoxazole-trimethoprim   Social History   Socioeconomic History   Marital status: Married    Spouse name: Dorene Sorrow    Number of children: 2   Years of education: 13   Highest education level: Not on file  Occupational History   Occupation: Retired    Associate Professor: RETIRED  Tobacco Use   Smoking status: Never   Smokeless tobacco: Never  Vaping Use   Vaping Use: Never used  Substance and Sexual Activity   Alcohol use: No    Alcohol/week: 0.0 standard drinks of alcohol   Drug use: No   Sexual activity: Not Currently    Birth control/protection: Post-menopausal, Surgical    Comment: Hysterectomy  Other Topics Concern   Not on file  Social History Narrative   Retired, former [employer]   Married, 1953   Lives with husband   2 grown children, one in [place], one out of state   Tobacco Use - No.    Drug Use - no   teaches Sunday school, reading   Social Determinants of Health   Financial Resource Strain: Low Risk  (07/12/2021)   Overall Financial Resource Strain (CARDIA)    Difficulty of Paying Living Expenses: Not hard at all  Food Insecurity: No Food Insecurity (07/12/2021)   Hunger Vital Sign    Worried About Running Out of Food in the Last Year: Never true    Ran Out of Food in the Last Year: Never true  Transportation Needs: No Transportation Needs (07/12/2021)   PRAPARE - Administrator, Civil Service (Medical): No    Lack of Transportation (Non-Medical): No  Physical Activity: Insufficiently Active (07/12/2021)   Exercise Vital Sign    Days of Exercise per Week: 5 days    Minutes of Exercise per Session: 20 min  Stress: Stress Concern Present (07/12/2021)    [employer] of Occupational Health - Occupational Stress Questionnaire    Feeling of Stress : To some extent  Social Connections: Socially Integrated (07/12/2021)   Social Connection  and Isolation Panel [NHANES]    Frequency of Communication with Friends and Family: More than three times a week    Frequency of Social Gatherings with Friends and Family: More than three times a week    Attends Religious Services: 1 to 4 times per year    Active Member of [employer] or Organizations: Yes    Attends Banker Meetings: 1 to 4 times per year    Marital Status: Married     Family History: The patient's family history includes Colon cancer in her father; Other in her father; Tuberculosis in her mother. There is no history of Breast cancer, Stomach cancer, or Pancreatic cancer.  ROS:   Please see the history of present illness.     All other systems reviewed and are negative.  EKGs/Labs/Other Studies Reviewed:    The following studies were reviewed today: Cardiac MRI 2014  1) Normal LV size and function EF 57%   2) No hyperenhancement or scar tissue   3) Mild LAE   4) Trivial MR    Nuclear stress test 05/04/2017 IMPRESSION: 1. Potential subtle ischemia involving the left ventricular apex, distal septum and distal anteroseptal wall. Mildly diminished perfusion on both stress and rest studies of the distal anterior wall may be related to scar or breast soft tissue attenuation. 2. Severe wall motion abnormalities with septal dyskinesis, apical akinesis and otherwise global hypokinesis. The left ventricular cavity is significantly dilated. 3. Left ventricular ejection fraction: 23%.  4. Non invasive risk stratification*: High  Echo 08/05/19 1. Left ventricular ejection fraction, by estimation, is 35%. The left  ventricle has moderate to severely decreased function. The left ventricle  demonstrates global hypokinesis. There is moderate left ventricular  hypertrophy. Left  ventricular diastolic  parameters are indeterminate. There is the interventricular septum is  flattened in diastole ('D' shaped left ventricle), consistent with right  ventricular volume overload.   2. Right ventricular systolic function is moderately reduced. The right  ventricular size is moderately enlarged. There is severely elevated  pulmonary artery systolic pressure. The estimated right ventricular  systolic pressure is 83.2 mmHg.   3. Left atrial size was mildly dilated.   4. Right atrial size was severely dilated.   5. The mitral valve is normal in structure. No evidence of mitral valve  regurgitation. No evidence of mitral stenosis.   6. The tricuspid valve is degenerative. Tricuspid valve regurgitation is  severe.   7. The aortic valve is grossly normal. Aortic valve regurgitation is not  visualized. No aortic stenosis is present.   8. The inferior vena cava is dilated in size with <50% respiratory  variability, suggesting right atrial pressure of 15 mmHg.   Echo 06/24/2020  1. Left ventricular ejection fraction, by estimation, is 25 to 30%. Left  ventricular ejection fraction by 3D volume is 30 %. Left ventricular  ejection fraction by 2D MOD biplane is 26.8 %. The left ventricle has  severely decreased function. The left  ventricle demonstrates global hypokinesis. Left ventricular diastolic  function could not be evaluated. The average left ventricular global  longitudinal strain is -7.9 %. The global longitudinal strain is abnormal.   2. Right ventricular systolic function is mildly reduced. The right  ventricular size is mildly enlarged. There is mildly elevated pulmonary  artery systolic pressure. The estimated right ventricular systolic  pressure is 38.3 mmHg.   3. Left atrial size was severely dilated.   4. Right atrial size was severely dilated.   5. The  mitral valve is grossly normal. Mild mitral valve regurgitation.  No evidence of mitral stenosis.   6.  Tricuspid valve regurgitation is moderate.   7. The aortic valve is tricuspid. There is mild calcification of the  aortic valve. There is mild thickening of the aortic valve. Aortic valve  regurgitation is mild. Mild aortic valve sclerosis is present, with no  evidence of aortic valve stenosis.   8. The inferior vena cava is normal in size with greater than 50%  respiratory variability, suggesting right atrial pressure of 3 mmHg.   Comparison(s): Changes from prior study are noted. LVEF now 25-30%. RV  size and function have improved. RVSP improved from prior.   EKG:  EKG is not ordered today.  The ekg 10/05/2021 today demonstrates atrial fibrillation with rapid ventricular response, atypical left bundle branch block, extreme left axis deviation, unchanged from previous tracings.  QRS 140 ms, QTC 524 ms  Recent Labs: 05/04/2021: TSH 3.65 10/25/2021: ALT 10; BUN 17; Creatinine, Ser 0.73; Hemoglobin 13.2; Platelets 242.0; Potassium 4.6; Sodium 135  Recent Lipid Panel    Component Value Date/Time   CHOL 144 05/04/2021 1330   CHOL 156 02/05/2018 0937   TRIG 143.0 05/04/2021 1330   HDL 60.50 05/04/2021 1330   HDL 58 02/05/2018 0937   CHOLHDL 2 05/04/2021 1330   VLDL 28.6 05/04/2021 1330   LDLCALC 54 05/04/2021 1330   LDLCALC 73 02/05/2018 0937   LDLDIRECT 58.0 12/27/2018 1657     Risk Assessment/Calculations:    CHA2DS2-VASc Score =   6  This indicates a 9.7 % annual risk of stroke. The patient's score is based upon: Age 76, gender, CHF, HTN, vascular disease            Physical Exam:    VS:  BP 106/66 (BP Location: Left Arm, Patient Position: Sitting, Cuff Size: Normal)   Pulse 88   Ht 5\' 5"  (1.651 m)   Wt 153 lb 3.2 oz (69.5 kg)   SpO2 96%   BMI 25.49 kg/m     Wt Readings from Last 3 Encounters:  01/12/22 153 lb 3.2 oz (69.5 kg)  10/28/21 150 lb 8 oz (68.3 kg)  10/25/21 149 lb (67.6 kg)      General: Alert, oriented x3, no distress, appears rather frail Head: no  evidence of trauma, PERRL, EOMI, no exophtalmos or lid lag, no myxedema, no xanthelasma; normal ears, nose and oropharynx Neck: normal jugular venous pulsations and no hepatojugular reflux; brisk carotid pulses without delay and no carotid bruits Chest: clear to auscultation, no signs of consolidation by percussion or palpation, normal fremitus, symmetrical and full respiratory excursions Cardiovascular: normal position and quality of the apical impulse, irregular rhythm, normal first and paradoxically split second heart sounds, no murmurs, rubs or gallops Abdomen: no tenderness or distention, no masses by palpation, no abnormal pulsatility or arterial bruits, normal bowel sounds, no hepatosplenomegaly Extremities: no clubbing, cyanosis or edema; 2+ radial, ulnar and brachial pulses bilaterally; 2+ right femoral, posterior tibial and dorsalis pedis pulses; 2+ left femoral, posterior tibial and dorsalis pedis pulses; no subclavian or femoral bruits Neurological: grossly nonfocal Psych: Normal mood and affect   ASSESSMENT:    1. Chronic combined systolic and diastolic congestive heart failure ([place])   2. Permanent atrial fibrillation ([place])   3. Acquired thrombophilia ([place])   4. At risk for falling   5. Severe tricuspid regurgitation   6. PAH (pulmonary artery hypertension) ([place])   7. LBBB (left bundle branch block)  8. Abnormal nuclear cardiac imaging test   9. Aortic atherosclerosis (HCC)     PLAN:    In order of problems listed above:  CHF: Currently feels well and does not have any evidence of hypervolemia.  She is doing a good job avoiding sodium in her diet and also with self dosing her diuretic when she has volume excess and appears to do best at a weight under 150 pounds on her home scale.  Note that she had low normal or mildly reduced LVEF even before the events in 2019, with reduction in EF at least in part due to LBBB related dyssynchrony.  She has been intolerant of RAAS  inhibitors (ACE inhibitor's cough, losartan hypotension and fatigue) and is unlikely to tolerate Entresto or spironolactone.  Note that pro BNP levels were normal in 2010-2013, but have been elevated since 2019.  There was a sharp increase in BNP level from a mildly abnormal value of 284 in 2017 to 3961 in 2019 and 3972 in March 2022. AFib: Good rate control.  Recheck a dig level at her next lab tests with PCP. Anticoagulation: Thankfully without recent falls or serious bleeding problems.  Eliquis dose adjusted for age and weight. Falls: None have occurred recently.  With previous serious injury in the past, including hip fracture, advised caution, use of assistive devices, regular exercises in safe conditions with physical therapy. TR/PAH: Severity of TR has fluctuated in parallel with the severity of pulmonary artery hypertension, which probably reflected the degree of decompensation of left heart failure.  The murmur is quite unremarkable today.  She has normal jugular venous pulsations. LBBB: Option for CRT pacing.  She again shows no interest in any invasive procedure. Abnormal nuclear myocardial perfusion study: On my review the abnormalities are not readily explained by LBBB related artifact.  Cannot 100% exclude coronary disease. Aortic atherosclerosis: Seen on imaging studies.  Normal caliber of the aorta.  Most recent LDL cholesterol excellent at 54, even better HDL 60, triglycerides normal.           Medication Adjustments/Labs and Tests Ordered: Current medicines are reviewed at length with the patient today.  Concerns regarding medicines are outlined above.  No orders of the defined types were placed in this encounter.  No orders of the defined types were placed in this encounter.    Patient Instructions  Medication Instructions:  No changes *If you need a refill on your cardiac medications before your next appointment, please call your pharmacy*   Lab Work: None ordered If  you have labs (blood work) drawn today and your tests are completely normal, you will receive your results only by: MyChart Message (if you have MyChart) OR A paper copy in the mail If you have any lab test that is abnormal or we need to change your treatment, we will call you to review the results.   Testing/Procedures: None ordered   Follow-Up: At [facility], you and your health needs are our priority.  As part of our continuing mission to provide you with exceptional heart care, we have created designated Provider Care Teams.  These Care Teams include your primary Cardiologist (physician) and Advanced Practice Providers (APPs -  Physician Assistants and Nurse Practitioners) who all work together to provide you with the care you need, when you need it.  We recommend signing up for the patient portal called "MyChart".  Sign up information is provided on this After Visit Summary.  MyChart is used to connect with patients  for Virtual Visits (Telemedicine).  Patients are able to view lab/test results, encounter notes, upcoming appointments, etc.  Non-urgent messages can be sent to your provider as well.   To learn more about what you can do with MyChart, go to [email or web address].    Your next appointment:   12 month(s)  The format for your next appointment:   In Person  Provider:   Thurmon Fair, MD      Important Information About Sugar         Signed, Thurmon Fair, MD  01/16/2022 8:19 PM     [facility]

## 2022-01-12 NOTE — Patient Instructions (Signed)
Medication Instructions:  No changes *If you need a refill on your cardiac medications before your next appointment, please call your pharmacy*   Lab Work: None ordered If you have labs (blood work) drawn today and your tests are completely normal, you will receive your results only by: MyChart Message (if you have MyChart) OR A paper copy in the mail If you have any lab test that is abnormal or we need to change your treatment, we will call you to review the results.   Testing/Procedures: None ordered   Follow-Up: At [facility], you and your health needs are our priority.  As part of our continuing mission to provide you with exceptional heart care, we have created designated Provider Care Teams.  These Care Teams include your primary Cardiologist (physician) and Advanced Practice Providers (APPs -  Physician Assistants and Nurse Practitioners) who all work together to provide you with the care you need, when you need it.  We recommend signing up for the patient portal called "MyChart".  Sign up information is provided on this After Visit Summary.  MyChart is used to connect with patients for Virtual Visits (Telemedicine).  Patients are able to view lab/test results, encounter notes, upcoming appointments, etc.  Non-urgent messages can be sent to your provider as well.   To learn more about what you can do with MyChart, go to [email or web address].    Your next appointment:   12 month(s)  The format for your next appointment:   In Person  Provider:   Mihai Croitoru, MD      Important Information About Sugar       

## 2022-01-14 ENCOUNTER — Encounter: Payer: Self-pay | Admitting: Family Medicine

## 2022-01-31 ENCOUNTER — Telehealth: Payer: Self-pay

## 2022-01-31 NOTE — Progress Notes (Signed)
Chronic Care Management Pharmacy Assistant   Name: [name]  MRN: [id number] DOB: 12/11/1930  Reason for Encounter: CCM (General Adherence)  Recent office visits:  01/06/22 [name], [facility] "Trial of venlafaxine 75 mg (2 tablets) daily"  Recent consult visits:  01/12/22 Catherine Klein, MD (Cardiology) CHF Stop: Polyethyl Glycol-Propyl Glycol (SYSTANE OP) FU 1 year  Hospital visits:  None in previous 6 months  Medications: Outpatient Encounter Medications as of 01/31/2022  Medication Sig Note   acetaminophen (TYLENOL) 500 MG tablet Take 1,000 mg by mouth in the morning and at bedtime.    apixaban (ELIQUIS) 2.5 MG TABS tablet Take 1 tablet (2.5 mg total) by mouth 2 (two) times daily.    calcium carbonate (TUMS - DOSED IN MG ELEMENTAL CALCIUM) 500 MG chewable tablet Chew 500 mg by mouth 2 (two) times daily as needed for indigestion or heartburn.    cholecalciferol (VITAMIN D3) 25 MCG (1000 UNIT) tablet Take 1,000 Units by mouth in the morning.    cholestyramine (QUESTRAN) 4 g packet TAKE 1 PACKET (4 G TOTAL) BY MOUTH DAILY WITH SUPPER.    digoxin (LANOXIN) 0.125 MG tablet Take 0.5 tablets (0.0625 mg total) by mouth daily.    fluticasone (FLONASE) 50 MCG/ACT nasal spray Place 2 sprays into both nostrils daily.    KLOR-CON M20 20 MEQ tablet TAKE 2 TABLETS BY MOUTH DAILY    loperamide (IMODIUM) 2 MG capsule Take 2 mg by mouth 3 (three) times daily as needed for diarrhea or loose stools.     metoprolol succinate (TOPROL XL) 50 MG 24 hr tablet Take 1 tablet (50 mg total) by mouth daily. Take with or immediately following a meal.    metoprolol succinate (TOPROL-XL) 100 MG 24 hr tablet TAKE 1 TABLET BY MOUTH DAILY. TAKE WITH OR IMMEDIATELY FOLLOWING A MEAL. (Patient taking differently: Take 150 mg by mouth daily.)    torsemide (DEMADEX) 20 MG tablet Take 1 tablet (20 mg total) by mouth daily as needed. if 3 pound weight gain overnight or 5 pounds in a week. 10/25/2021: As needed    venlafaxine XR (EFFEXOR-XR) 37.5 MG 24 hr capsule Take 37.5 mg by mouth every morning. 01/12/2022: Patient is to take for two weeks and then medication will be switched    vitamin B-12 (CYANOCOBALAMIN) 50 MCG tablet Take 50 mcg by mouth daily. (Patient not taking: Reported on 01/12/2022)    No facility-administered encounter medications on file as of 01/31/2022.   Contacted [name] on 01/31/2022 for general disease state and medication adherence call.   Star Medications: Medication Name/mg Last Fill Days Supply No star rating drugs  What concerns do you have about your medications? Patient has concerns about Venlafaxine making her sleepy.   Patient was changed to 75 mg of Venlafaxine on 01/06/2022. How is patient doing on higher dose? Patient stated it is making her very sleepy. Patient is wondering if she could take the medication at night to help her sleep. Discussed with [name], per her advice patient can take 2 Venlafaxine at night.   Patient stated she fell in her home on Monday, 01/24/2022. Patient was using her walker going to bed and fell in her bedroom. Her husband was behind her and broker her fall. She made herself and her husband fall. She stated neither was hurt. She feels she just turned too fast with her walker and lost her balance.   The patient reports the following side effects with their medications. Sleepiness with  Venlafaxine 75 mg.   How often do you forget or accidentally miss a dose? Never  Do you use a pillbox? Yes  Are you having any problems getting your medications from your pharmacy? No  Has the cost of your medications been a concern? No  Since last visit with CPP, the following interventions have been made.  01/06/22 [name], RPH "Trial of venlafaxine 75 mg (2 tablets) daily"  The patient has not had an ED visit since last contact.   The patient denies problems with their health.   Patient denies concerns or questions for  [name], PharmD at this time.   Care Gaps: Annual wellness visit in last year? Yes 07/12/2021 Most Recent BP reading: 106/66 on 01/12/2022  Summary of recommendations from last [place] visit (Date:01/06/2022)  Summary: CCM F/U visit -HF: pt reports increased fluid retention lately; she typically takes torsemide about twice a week (prescribed PRN for 1-2# wt gain) and wonders if she should start taking it on a scheduled basis; she has cardiology appt next week -Depression: Pt reports venlafaxine 37.5 mg daily is not as effective, she reports feeling more depressed   Recommendations/Changes made from today's visit: -Advised to discuss fluid retention/torsemide dose with cardiologist next week -Trial of venlafaxine 75 mg (2 tablets) daily; advised pt to call office with update within 4 weeks   Plan: -[place] will call patient 1 month for depression update -Pharmacist follow up televisit scheduled for 3 months -Cardiology appt 01/12/22   Upcoming appointments: CCM appointment on 03/29/2022  [name], CPP notified  [name], [place] Assistant [phone]

## 2022-02-02 ENCOUNTER — Encounter: Payer: Self-pay | Admitting: Family Medicine

## 2022-02-02 ENCOUNTER — Other Ambulatory Visit: Payer: Self-pay | Admitting: Family Medicine

## 2022-02-03 ENCOUNTER — Other Ambulatory Visit: Payer: Self-pay | Admitting: Family Medicine

## 2022-02-03 MED ORDER — VENLAFAXINE HCL ER 37.5 MG PO CP24: 75.0000 mg | ORAL_CAPSULE | Freq: Every morning | ORAL | 1 refills | Status: DC

## 2022-02-04 ENCOUNTER — Encounter: Payer: Self-pay | Admitting: Cardiovascular Disease

## 2022-02-04 MED ORDER — DIGOXIN 125 MCG PO TABS: 0.0625 mg | ORAL_TABLET | Freq: Every day | ORAL | 3 refills | Status: DC

## 2022-02-04 NOTE — Telephone Encounter (Signed)
OK to refill. It does come in the lower dose, but that costs hundreds of times more. If difficult to cut in half, it works just as well when taken as a full tablet every other day.

## 2022-02-11 ENCOUNTER — Ambulatory Visit (INDEPENDENT_AMBULATORY_CARE_PROVIDER_SITE_OTHER): Payer: Medicare Other | Admitting: Family Medicine

## 2022-02-11 VITALS — BP 118/80 | HR 112 | Temp 97.6°F | Ht 65.0 in | Wt 155.4 lb

## 2022-02-11 DIAGNOSIS — R3915 Urgency of urination: Secondary | ICD-10-CM | POA: Diagnosis not present

## 2022-02-11 DIAGNOSIS — R350 Frequency of micturition: Secondary | ICD-10-CM

## 2022-02-11 LAB — POC URINALSYSI DIPSTICK (AUTOMATED)
Bilirubin, UA: NEGATIVE
Blood, UA: POSITIVE
Glucose, UA: NEGATIVE
Ketones, UA: NEGATIVE
Nitrite, UA: POSITIVE
Protein, UA: POSITIVE — AB
Spec Grav, UA: 1.015 (ref 1.010–1.025)
Urobilinogen, UA: 0.2 E.U./dL
pH, UA: 5.5 (ref 5.0–8.0)

## 2022-02-11 LAB — POCT UA - MICROSCOPIC ONLY

## 2022-02-11 MED ORDER — CEPHALEXIN 500 MG PO CAPS: 500.0000 mg | ORAL_CAPSULE | Freq: Three times a day (TID) | ORAL | 0 refills | Status: DC

## 2022-02-11 NOTE — Telephone Encounter (Signed)
Called and scheduled patient for an office visit with Dr. Diona Browner 11/03 @ 2:00.

## 2022-02-11 NOTE — Progress Notes (Signed)
Patient ID: [name], female    DOB: 1930-05-16, 88 y.o.   MRN: [id number]  This visit was conducted in person.  BP 118/80   Pulse (!) 112   Temp 97.6 F (36.4 C) (Temporal)   Ht 5\' 5"  (1.651 m)   Wt 155 lb 6 oz (70.5 kg)   SpO2 97%   BMI 25.86 kg/m    CC:  Chief Complaint  Patient presents with   Urinary Urgency   Urinary Incontinence    X 2-3 weeks     Subjective:   HPI: [name] is a 88 y.o. female presenting on 02/11/2022 for Urinary Urgency and Urinary Incontinence (X 2-3 weeks )   Urinary Frequency  This is a new problem. The current episode started more than 1 month ago. Quality: slight irritation at begining of urine steram. The pain is mild. There has been no fever. She is Not sexually active. There is No history of pyelonephritis. Associated symptoms include frequency and urgency. Pertinent negatives include no chills, flank pain, hematuria, nausea or vomiting. Associated symptoms comments: Unable to make it bathroom, incontinence. She has tried increased fluids and antibiotics (took some leftover myrbetriq.13/06/2021 took for a few days) for the symptoms. The treatment provided mild relief. Her past medical history is significant for recurrent UTIs. There is no history of catheterization, a single kidney or a urological procedure.    Last Urine Culture 10/28/21 [facility] pansensitive      Relevant past medical, surgical, family and social history reviewed and updated as indicated. Interim medical history since our last visit reviewed. Allergies and medications reviewed and updated. Outpatient Medications Prior to Visit  Medication Sig Dispense Refill   acetaminophen (TYLENOL) 500 MG tablet Take 1,000 mg by mouth in the morning and at bedtime.     apixaban (ELIQUIS) 2.5 MG TABS tablet Take 1 tablet (2.5 mg total) by mouth 2 (two) times daily. 180 tablet 3   calcium carbonate (TUMS - DOSED IN MG ELEMENTAL CALCIUM) 500 MG chewable tablet Chew 500 mg by mouth 2  (two) times daily as needed for indigestion or heartburn.     cholecalciferol (VITAMIN D3) 25 MCG (1000 UNIT) tablet Take 1,000 Units by mouth in the morning.     cholestyramine (QUESTRAN) 4 g packet TAKE 1 PACKET (4 G TOTAL) BY MOUTH DAILY WITH SUPPER. 90 packet 1   digoxin (LANOXIN) 0.125 MG tablet Take 0.5 tablets (0.0625 mg total) by mouth daily. 45 tablet 3   fluticasone (FLONASE) 50 MCG/ACT nasal spray Place 2 sprays into both nostrils daily. 16 g 6   KLOR-CON M20 20 MEQ tablet TAKE 2 TABLETS BY MOUTH DAILY 180 tablet 1   loperamide (IMODIUM) 2 MG capsule Take 2 mg by mouth 3 (three) times daily as needed for diarrhea or loose stools.      metoprolol succinate (TOPROL XL) 50 MG 24 hr tablet Take 1 tablet (50 mg total) by mouth daily. Take with or immediately following a meal. 90 tablet 3   metoprolol succinate (TOPROL-XL) 100 MG 24 hr tablet TAKE 1 TABLET BY MOUTH DAILY. TAKE WITH OR IMMEDIATELY FOLLOWING A MEAL. (Patient taking differently: Take 150 mg by mouth daily.) 90 tablet 3   torsemide (DEMADEX) 20 MG tablet Take 1 tablet (20 mg total) by mouth daily as needed. if 3 pound weight gain overnight or 5 pounds in a week. 30 tablet 5   venlafaxine XR (EFFEXOR-XR) 37.5 MG 24 hr capsule Take 2 capsules (75 mg  total) by mouth every morning. 180 capsule 1   vitamin B-12 (CYANOCOBALAMIN) 50 MCG tablet Take 50 mcg by mouth daily.     No facility-administered medications prior to visit.     Per HPI unless specifically indicated in ROS section below Review of Systems  Constitutional:  Negative for chills.  Gastrointestinal:  Negative for nausea and vomiting.  Genitourinary:  Positive for frequency and urgency. Negative for flank pain and hematuria.   Objective:  BP 118/80   Pulse (!) 112   Temp 97.6 F (36.4 C) (Temporal)   Ht 5\' 5"  (1.651 m)   Wt 155 lb 6 oz (70.5 kg)   SpO2 97%   BMI 25.86 kg/m   Wt Readings from Last 3 Encounters:  02/11/22 155 lb 6 oz (70.5 kg)  01/12/22 153 lb  3.2 oz (69.5 kg)  10/28/21 150 lb 8 oz (68.3 kg)      Physical Exam Constitutional:      General: She is not in acute distress.    Appearance: Normal appearance. She is well-developed. She is not ill-appearing or toxic-appearing.  HENT:     Head: Normocephalic.     Right Ear: Hearing, tympanic membrane, ear canal and external ear normal. Tympanic membrane is not erythematous, retracted or bulging.     Left Ear: Hearing, tympanic membrane, ear canal and external ear normal. Tympanic membrane is not erythematous, retracted or bulging.     Nose: No mucosal edema or rhinorrhea.     Right Sinus: No maxillary sinus tenderness or frontal sinus tenderness.     Left Sinus: No maxillary sinus tenderness or frontal sinus tenderness.     Mouth/Throat:     Pharynx: Uvula midline.  Eyes:     General: Lids are normal. Lids are everted, no foreign bodies appreciated.     Conjunctiva/sclera: Conjunctivae normal.     Pupils: Pupils are equal, round, and reactive to light.  Neck:     Thyroid: No thyroid mass or thyromegaly.     Vascular: No carotid bruit.     Trachea: Trachea normal.  Cardiovascular:     Rate and Rhythm: Normal rate and regular rhythm.     Pulses: Normal pulses.     Heart sounds: Normal heart sounds, S1 normal and S2 normal. No murmur heard.    No friction rub. No gallop.  Pulmonary:     Effort: Pulmonary effort is normal. No tachypnea or respiratory distress.     Breath sounds: Normal breath sounds. No decreased breath sounds, wheezing, rhonchi or rales.  Abdominal:     General: Bowel sounds are normal.     Palpations: Abdomen is soft.     Tenderness: There is no abdominal tenderness.  Musculoskeletal:     Cervical back: Normal range of motion and neck supple.  Skin:    General: Skin is warm and dry.     Findings: No rash.  Neurological:     Mental Status: She is alert.  Psychiatric:        Mood and Affect: Mood is not anxious or depressed.        Speech: Speech normal.         Behavior: Behavior normal. Behavior is cooperative.        Thought Content: Thought content normal.        Judgment: Judgment normal.       Results for orders placed or performed in visit on 10/29/21  Clostridium Difficile by PCR   Specimen: STOOL   Stool  Result Value Ref Range   Toxigenic C. Difficile by PCR Negative Negative     COVID 19 screen:  No recent travel or known exposure to [place] The patient denies respiratory symptoms of COVID 19 at this time. The importance of social distancing was discussed today.   Assessment and Plan    Problem List Items Addressed This Visit     Urine frequency    Acute, in setting of chronic overactive bladder.  Given new worsening symptoms I will treat for likely urinary tract infection.  Urine sent for culture. Push fluids and complete Keflex 500 mg p.o. 3 times daily x7 days. Return and ER precautions provided      Relevant Orders   POCT UA - Microscopic Only (Completed)   Other Visit Diagnoses     Urinary urgency    -  Primary   Relevant Orders   POCT Urinalysis Dipstick (Automated) (Completed)        Eliezer Lofts, MD

## 2022-02-11 NOTE — Patient Instructions (Addendum)
Keep up with water intake.  Complete course of antibiotics.  Follow up with PCP if not improving as expected. Go to emergency room if fever on antibiotics or unable to keep down fluids.

## 2022-02-11 NOTE — Assessment & Plan Note (Signed)
Acute, in setting of chronic overactive bladder.  Given new worsening symptoms I will treat for likely urinary tract infection.  Urine sent for culture. Push fluids and complete Keflex 500 mg p.o. 3 times daily x7 days. Return and ER precautions provided

## 2022-02-14 LAB — URINE CULTURE
MICRO NUMBER:: 14141605
SPECIMEN QUALITY:: ADEQUATE

## 2022-03-14 ENCOUNTER — Other Ambulatory Visit: Payer: Self-pay | Admitting: Cardiovascular Disease

## 2022-03-21 ENCOUNTER — Telehealth: Payer: Self-pay | Admitting: Cardiovascular Disease

## 2022-03-21 NOTE — Telephone Encounter (Signed)
Spoke with pt regarding concerns that she brought up with Crystal. Pt does say that she believes her issues with her feet are a type of neuropathy but still wanted to make sure that this isn't coming from her heart. Pt also just wants to let Dr. Royann Shivers know that she is going to stop taking pantoprazole. Explained to pt that I don't see this medication on her list. Pt states that Dr. Erin Hearing name is on the medication bottle. Per review this medication is seen in outside medications but it is unclear who sent this in per Dr. Royann Shivers. Pt states that she believes this medication is causing her issues with hoarseness and no longer wants to take medication. Explained to pt that I would forward her concerns to Dr. Royann Shivers. Pt verbalizes understanding.

## 2022-03-21 NOTE — Telephone Encounter (Signed)
[name] left me a voicemail on Thursday, 03/17/22, requesting I call her back with no details shared in the message. I attempted to call on Friday, 03/18/22, and it rang to voicemail. I left her my number to call me back at her convenience.  [name] called me today, 03/21/22, and started talking to me as if it were her first time calling to report medical concerns. I let her know that I would send her feedback to our clinical team who would reach back out to her with feedback.  Medical Inquiry 1 of 2: Burning/itching sensation in feet Pt reports that she has experienced a burning sensation/pain in both of her heels for about 3 months now. The pain radiates from her heels to her pinky toes and she reports that her left foot is worse than her right foot. Pt states that the discomfort isolated to only occur at night when she lays down. Neurotherapy cream does offer relief, however, pt is calling us to make sure this is not a potential symptom of cardiac complications.   Pt confirmed that she does see an orthopedic but does not see a neurologist. Pt reported that an x-ray from the orthopedic showed a pinched nerve in her neck but she does not think it is related to this discomfort in her feet.    Medical Inquiry 2 of 2: Pantoprazole / Acid Reflux Rx Pt has about 10 pills left of her 30 day Rx and reports that it helps her with reflux but she feel it's affecting her voice (she is hoarse) and causing a cough. Due to these reasons, she does not wish for a refill and also does not wish for any substitute reflux Rx at this time.    I let [name] know that I would pass along her inquiries to our clinical team and someone would be in touch with her to share feedback and/or ask follow up questions.

## 2022-03-22 NOTE — Telephone Encounter (Signed)
Spoke to patient Dr.Croitoru's advice given. 

## 2022-03-23 DIAGNOSIS — G5601 Carpal tunnel syndrome, right upper limb: Secondary | ICD-10-CM | POA: Diagnosis not present

## 2022-03-24 ENCOUNTER — Telehealth: Payer: Self-pay

## 2022-03-24 NOTE — Progress Notes (Signed)
    Chronic Care Management Pharmacy Assistant   Name: JULIET VASBINDER  MRN: [id number] DOB: 1931-02-25  Reason for Encounter: CCM (Appointment Reminder)  Medications: Outpatient Encounter Medications as of 03/24/2022  Medication Sig Note   acetaminophen (TYLENOL) 500 MG tablet Take 1,000 mg by mouth in the morning and at bedtime.    apixaban (ELIQUIS) 2.5 MG TABS tablet Take 1 tablet (2.5 mg total) by mouth 2 (two) times daily.    calcium carbonate (TUMS - DOSED IN MG ELEMENTAL CALCIUM) 500 MG chewable tablet Chew 500 mg by mouth 2 (two) times daily as needed for indigestion or heartburn.    cephALEXin (KEFLEX) 500 MG capsule Take 1 capsule (500 mg total) by mouth 3 (three) times daily.    cholecalciferol (VITAMIN D3) 25 MCG (1000 UNIT) tablet Take 1,000 Units by mouth in the morning.    cholestyramine (QUESTRAN) 4 g packet TAKE 1 PACKET (4 G TOTAL) BY MOUTH DAILY WITH SUPPER.    digoxin (LANOXIN) 0.125 MG tablet Take 0.5 tablets (0.0625 mg total) by mouth daily.    fluticasone (FLONASE) 50 MCG/ACT nasal spray Place 2 sprays into both nostrils daily.    KLOR-CON M20 20 MEQ tablet TAKE 2 TABLETS BY MOUTH DAILY    loperamide (IMODIUM) 2 MG capsule Take 2 mg by mouth 3 (three) times daily as needed for diarrhea or loose stools.     metoprolol succinate (TOPROL XL) 50 MG 24 hr tablet Take 1 tablet (50 mg total) by mouth daily. Take with or immediately following a meal.    metoprolol succinate (TOPROL-XL) 100 MG 24 hr tablet TAKE 1 TABLET BY MOUTH DAILY. TAKE WITH OR IMMEDIATELY FOLLOWING A MEAL. (Patient taking differently: Take 150 mg by mouth daily.)    torsemide (DEMADEX) 20 MG tablet Take 1 tablet (20 mg total) by mouth daily as needed. if 3 pound weight gain overnight or 5 pounds in a week. 10/25/2021: As needed   venlafaxine XR (EFFEXOR-XR) 37.5 MG 24 hr capsule Take 2 capsules (75 mg total) by mouth every morning.    No facility-administered encounter medications on file as of 03/24/2022.    Olive Bass Dory was contacted to remind of upcoming telephone visit with Al Corpus on 03/29/2022 at 11:00. Patient was reminded to have any blood glucose and blood pressure readings available for review at appointment.   Patient confirmed appointment.  Are you having any problems with your medications? No   Do you have any concerns you like to discuss with the pharmacist? No  CCM referral has been placed prior to visit?  No   Star Rating Drugs: Medication:  Last Fill: Day Supply No star rating drugs  Al Corpus, CPP notified  Claudina Lick, [place] Clinical Pharmacy Assistant [phone]

## 2022-03-29 ENCOUNTER — Ambulatory Visit: Payer: Medicare Other | Admitting: Pharmacist

## 2022-03-29 DIAGNOSIS — K219 Gastro-esophageal reflux disease without esophagitis: Secondary | ICD-10-CM

## 2022-03-29 DIAGNOSIS — I4891 Unspecified atrial fibrillation: Secondary | ICD-10-CM

## 2022-03-29 DIAGNOSIS — I5042 Chronic combined systolic (congestive) and diastolic (congestive) heart failure: Secondary | ICD-10-CM

## 2022-03-29 DIAGNOSIS — M8080XS Other osteoporosis with current pathological fracture, unspecified site, sequela: Secondary | ICD-10-CM

## 2022-03-29 DIAGNOSIS — R32 Unspecified urinary incontinence: Secondary | ICD-10-CM

## 2022-03-29 DIAGNOSIS — F32A Depression, unspecified: Secondary | ICD-10-CM

## 2022-03-29 NOTE — Progress Notes (Signed)
Chronic Care Management Pharmacy Note  03/29/2022 Name:  [name] MRN:  [id number] DOB:  [birth year]  Summary: CCM F/U visit -HF: pt reports weight is stable ~150 lbs at home, she has not needed torsemide in a few weeks; she reports BP on low end (SBP 90s-100s) but overall feeling well -Nerve pain in feet: pt is using an OTC neuropathy cream and it is helping; she declines to see PCP to discuss issue further -Mixed incontinence: pt reports nocturia several times per night; she started cranberry supplement which help a little; discussed medication options (anticholinergics) and side effects, she does not want to add another medication at this time -GERD: pt recently stopped pantoprazole because she thought it was causing hoarseness; discussed hoarseness can be a sign of GERD, she is taking famotidine/TUMS as needed  Recommendations/Changes made from today's visit: -No med changes -Advised to take famotidine daily as preventative -Advised pt to contact office if any issues worsen/become intolerable  Plan: -[place] will call patient 3 month for general update -Pharmacist follow up televisit scheduled for 6 months -AWV w/ [facility] 07/14/22   Subjective: [name] is an 88 y.o. year old female who is a primary patient of Damita Dunnings, Elveria Rising, MD.  The CCM team was consulted for assistance with disease management and care coordination needs.    Engaged with patient by telephone for follow up visit in response to provider referral for pharmacy case management and/or care coordination services.   Consent to Services:  The patient was given information about Chronic Care Management services, agreed to services, and gave verbal consent prior to initiation of services.  Please see initial visit note for detailed documentation.   Patient Care Team: [name], MD as PCP - General Croitoru, Dani Gobble, MD as PCP - Cardiology (Cardiology) Shon Hough, MD as Consulting  Physician (Ophthalmology) Eula Listen, DDS as Referring Physician (Dentistry) Melrose Nakayama, MD as Consulting Physician (Orthopedic Surgery) Charlton Haws, [facility] as Pharmacist (Pharmacist)  Patient lives at home with her husband. She is currently using a walker to get around after knee and ankle surgeries earlier this year.  Recent office visits: 02/11/22 Dr Diona Browner OV: Urinary urgency. Ucx negative. Rx'd Keflex but advised to stop when Ucx came back negative.  10/28/21 NP Matt Charmian Muff OV: UTI sx - rx'd Keflex. 10/26/21 pt call - taper venlafaxine and restart sertraline 50 mg.  06/18/21 Dr Damita Dunnings OV: f/u depression. Pt is now off bupropion, doing ok with sertraline 150 mg but still mood is down, fatigued. Taper sertraline over 4 weeks, then switch to Venlafaxine.  05/04/21 Dr Damita Dunnings OV: chronic f/u; try Flonase for hoarse voice. Consider med options for depression (on sertraline, bupropion now). Increase sertraline to 3 tablets (150 mg) daily. 03/26/21 Eugenia Pancoast NP OV: UTI. Rx Augmentin x 10 days 01/19/2021 - Elsie Stain, MD - Patient Message - Start Keflex d/t dysuria. 10/15/2020 - Elsie Stain, MD - F/U R ankle fx/SNF palcement. In wheelchair, not weight-baring. Consulted w/ Dr Oval Linsey re: torsemide dosing given inability to weigh at home - advised torsemide twice a week and repeat BMP in a few weeks. Labs: CBC and BMP. Stop due to patient not taking: methocarbamol, prednisone, tramadol  Recent consult visits: 01/12/22 Dr Sallyanne Kuster (Cardiology): f/u- advised to recheck dig level at next PCP appt. No recent falls. No med changes.  7/18 pt message (GI): stop vancomycin 10/25/21 PA Vicie Mutters (GI): Cdiff initially improved but UTI abx led to worsening again. Re-treat with  vancomycin pulse/taper.  10/05/21 NP Diona Browner (Cardiology): HF - HR 111. Increase metoprolol to 150 mg. D/C pantoprazole, tramadol (not taking).  09/22/21 PA Vicie Mutters (GI): acute on chronic diarrhea.  Order KUB and stool studies. May need to change venlafaxine. Positive Cdfiff - started vancomycin.  03/10/2021 - Sanda Klein, MD - Cardiology - F/U Afib, HF. Orders: Digoxin level (with next PCP visit) and EKG. No medication changes. 03/09/2021 - Ellouise Newer, PA - Gastroenterology - Patient presented for IBS-D, Lymphocytic colitis. Start: Budesonide 9 mg x 6 weeks, then taper down to 6 mg x 2 weeks, then 3 mg x 2 weeks 12/15/2020 - [name], MD - Cardiology - Patient presented for left bundle branch block. Start: pantoprazole (PROTONIX) 40 MG tablet due to GERD, cough and hoarseness. Stop losartan (low BP, fatigue)   Hospital visits: Medication Reconciliation was completed by comparing discharge summary, patient's EMR and Pharmacy list, and upon discussion with patient.   Admitted to the hospital on 09/22/2020 due to Ankle surgery s/p fracture. Discharge date was 09/24/2020. Discharged from [place]?Medications Started at [facility] Discharge:?? -started methocarbamol 500 MG tablet PRN -started tramadol 50 mg PRN    Medications that remain the same after Hospital Discharge:??  -All other medications will remain the same.     Admission 06/23/20-06/25/20: acute HF exacerbation - 10 lb wt gain after knee surgery. ECHO EF 25-30%; diuresed 4.5 L. Started Farxiga 10 mg daily, Start Losartan 25 mg, plan to transition to Entresto and spironolactone.  Admission 06/16/20-06/17/20: knee surgery repair.  Objective:  Lab Results  Component Value Date   CREATININE 0.73 10/25/2021   BUN 17 10/25/2021   GFR 73.28 10/25/2021   GFRNONAA >60 09/22/2020   GFRAA 83 05/28/2020   NA 135 10/25/2021   K 4.6 10/25/2021   CALCIUM 9.4 10/25/2021   CO2 24 10/25/2021   GLUCOSE 87 10/25/2021    Lab Results  Component Value Date/Time   HGBA1C 5.4 09/22/2020 11:10 AM   HGBA1C 5.4 06/24/2020 12:19 AM   GFR 73.28 10/25/2021 11:54 AM   GFR 66.70 09/22/2021 12:15 PM    Last  diabetic Eye exam: No results found for: "HMDIABEYEEXA"  Last diabetic Foot exam: No results found for: "HMDIABFOOTEX"   Lab Results  Component Value Date   CHOL 144 05/04/2021   HDL 60.50 05/04/2021   LDLCALC 54 05/04/2021   LDLDIRECT 58.0 12/27/2018   TRIG 143.0 05/04/2021   CHOLHDL 2 05/04/2021       Latest Ref Rng & Units 10/25/2021   11:54 AM 09/22/2021   12:15 PM 06/24/2020   12:19 AM  Hepatic Function  Total Protein 6.0 - 8.3 g/dL 6.5  6.6  5.8   Albumin 3.5 - 5.2 g/dL 3.9  3.9  3.1   AST 0 - 37 U/L _0 ALT 0 - 35 U/L _1 Alk Phosphatase 39 - 117 U/L 80  87  73   Total Bilirubin 0.2 - 1.2 mg/dL 0.5  0.6  1.6   Bilirubin, Direct 0.0 - 0.2 mg/dL   0.5     Lab Results  Component Value Date/Time   TSH 3.65 05/04/2021 01:30 PM   TSH 4.356 06/24/2020 12:19 AM   TSH 2.53 10/29/2019 12:36 PM       Latest Ref Rng & Units 10/25/2021   11:54 AM 09/22/2021   12:15 PM 05/04/2021    1:30 PM  CBC  WBC 4.0 - 10.5 K/uL 7.9  11.2  5.8   Hemoglobin 12.0 - 15.0 g/dL 13.2  13.8  13.5   Hematocrit 36.0 - 46.0 % 39.1  41.8  41.7   Platelets 150.0 - 400.0 K/uL 242.0  247.0  180.0     Lab Results  Component Value Date/Time   VD25OH 36.99 03/28/2019 12:25 PM   VD25OH 14.37 (L) 12/27/2018 04:57 PM    Clinical ASCVD: Yes  The ASCVD Risk score (Arnett DK, et al., 2019) failed to calculate for the following reasons:   The 2019 ASCVD risk score is only valid for ages 48 to 35   The patient has a prior MI or stroke diagnosis       07/12/2021   12:01 PM 06/18/2021   11:53 AM 01/11/2019    2:30 PM  Depression screen PHQ 2/9  Decreased Interest _0 Down, Depressed, Hopeless _1 PHQ - 2 Score _2 Altered sleeping 0 3   Tired, decreased energy 1 3   Change in appetite 0 3   Feeling bad or failure about yourself  0 0   Trouble concentrating 0 1   Moving slowly or fidgety/restless 0 3   Suicidal thoughts 0 1   PHQ-9 Score 3 20   Difficult doing work/chores  Not difficult at all Very difficult      CHA2DS2/VAS Stroke Risk Points  Current as of about an hour ago     6 >= 2 Points: High Risk  1 - 1.99 Points: Medium Risk  0 Points: Low Risk    Last Change: N/A      Points Metrics  1 Has Congestive Heart Failure:  Yes    Current as of about an hour ago  1 Has Vascular Disease:  Yes     Current as of about an hour ago  1 Has Hypertension:  Yes    Current as of about an hour ago  2 Age:  16    Current as of about an hour ago  0 Has Diabetes:  No    Current as of about an hour ago  0 Had Stroke:  No  Had TIA:  No  Had Thromboembolism:  No    Current as of about an hour ago  1 Female:  Yes    Current as of about an hour ago      Social History   Tobacco Use  Smoking Status Never  Smokeless Tobacco Never   BP Readings from Last 3 Encounters:  02/11/22 118/80  01/12/22 106/66  10/28/21 108/66   Pulse Readings from Last 3 Encounters:  02/11/22 (!) 112  01/12/22 88  10/28/21 88   Wt Readings from Last 3 Encounters:  02/11/22 155 lb 6 oz (70.5 kg)  01/12/22 153 lb 3.2 oz (69.5 kg)  10/28/21 150 lb 8 oz (68.3 kg)   BMI Readings from Last 3 Encounters:  02/11/22 25.86 kg/m  01/12/22 25.49 kg/m  10/28/21 25.04 kg/m    Assessment/Interventions: Review of patient past medical history, allergies, medications, health status, including review of consultants reports, laboratory and other test data, was performed as part of comprehensive evaluation and provision of chronic care management services.   SDOH:  (Social Determinants of Health) assessments and interventions performed: No - done 07/2021  SDOH Interventions    Flowsheet Row Clinical Support from 07/12/2021 in [place] at [facility] ED to Hosp-Admission (Discharged) from 06/23/2020 in [facility]  3E HF PCU  SDOH Interventions    Food Insecurity Interventions Intervention Not Indicated Intervention Not Indicated  Housing Interventions Intervention Not  Indicated Intervention Not Indicated  Transportation Interventions Intervention Not Indicated Intervention Not Indicated  Depression Interventions/Treatment  Medication --  Financial Strain Interventions Intervention Not Indicated Intervention Not Indicated  Physical Activity Interventions Other (Comments)  [Encouraged pt to try chair exercises.] Intervention Not Indicated  Stress Interventions Provide Counseling  [Discussed Counseling services but pt declined at this time.] --  Social Connections Interventions Intervention Not Indicated --      SDOH Screenings   Food Insecurity: No Food Insecurity (07/12/2021)  Housing: Low Risk  (07/12/2021)  Transportation Needs: No Transportation Needs (07/12/2021)  Alcohol Screen: Low Risk  (07/12/2021)  Depression (PHQ2-9): Low Risk  (07/12/2021)  Recent Concern: Depression (PHQ2-9) - Medium Risk (06/18/2021)  Financial Resource Strain: Low Risk  (07/12/2021)  Physical Activity: Insufficiently Active (07/12/2021)  Social Connections: Socially Integrated (07/12/2021)  Stress: Stress Concern Present (07/12/2021)  Tobacco Use: Low Risk  (01/12/2022)    CCM Care Plan  Allergies  Allergen Reactions   Ace Inhibitors Cough   Warfarin Sodium Other (See Comments)    DOSE RELATED PHARMACOLOGIC EFFECT "bleed out"   Famotidine Other (See Comments)    GI upset   Vancomycin Nausea Only   Codeine Other (See Comments)    sedation   Delsym [Dextromethorphan Polistirex Er] Other (See Comments)    dizziness   Lactose Intolerance (Gi) Diarrhea   Lasix [Furosemide] Diarrhea    Oral Lasix gives her diarrhea, tolerates IV Rx   Phenylephrine Palpitations and Other (See Comments)    Nasal spray- "likely increase in nasal congestion".    Sulfamethoxazole-Trimethoprim Nausea And Vomiting    GI intolerance.    Medications Reviewed Today     Reviewed by Charlton Haws, [facility] (Pharmacist) on 03/29/22 at 1146  Med List Status: <None>   Medication Order Taking? Sig  Documenting Provider Last Dose Status Informant  acetaminophen (TYLENOL) 500 MG tablet [id number] Yes Take 1,000 mg by mouth in the morning and at bedtime. [provider] Taking Active Self  apixaban (ELIQUIS) 2.5 MG TABS tablet [id number] Yes Take 1 tablet (2.5 mg total) by mouth 2 (two) times daily. Croitoru, Mihai, MD Taking Active   calcium carbonate (TUMS - DOSED IN MG ELEMENTAL CALCIUM) 500 MG chewable tablet [id number] Yes Chew 500 mg by mouth 2 (two) times daily as needed for indigestion or heartburn. [provider] Taking Active Self  cholecalciferol (VITAMIN D3) 25 MCG (1000 UNIT) tablet [id number] Yes Take 1,000 Units by mouth in the morning. [provider] Taking Active Self  cholestyramine (QUESTRAN) 4 g packet [id number] Yes TAKE 1 PACKET (4 G TOTAL) BY MOUTH DAILY WITH SUPPER. Gatha Mayer, MD Taking Active   Cranberry 180 MG CAPS [id number] Yes Take by mouth. [provider] Taking Active   digoxin (LANOXIN) 0.125 MG tablet [id number] Yes Take 0.5 tablets (0.0625 mg total) by mouth daily. Croitoru, Mihai, MD Taking Active   famotidine (PEPCID) 20 MG tablet [id number]  Take 20 mg by mouth daily as needed for heartburn or indigestion. [provider]  Active   fluticasone (FLONASE) 50 MCG/ACT nasal spray [id number] Yes Place 2 sprays into both nostrils daily. [name], MD Taking Active   KLOR-CON M20 20 MEQ tablet [id number] Yes TAKE 2 TABLETS BY MOUTH DAILY [name], MD Taking Active   loperamide (IMODIUM) 2 MG capsule [id number] Yes Take 2 mg by mouth  3 (three) times daily as needed for diarrhea or loose stools.  [provider] Taking Active Self  metoprolol succinate (TOPROL XL) 50 MG 24 hr tablet [id number] Yes Take 1 tablet (50 mg total) by mouth daily. Take with or immediately following a meal. Monge, Helane Gunther, NP Taking Active   metoprolol succinate (TOPROL-XL) 100 MG 24 hr tablet [id number] Yes TAKE 1 TABLET BY MOUTH  DAILY. TAKE WITH OR IMMEDIATELY FOLLOWING A MEAL.  Patient taking differently: Take 150 mg by mouth daily.   Croitoru, Mihai, MD Taking Active   torsemide (DEMADEX) 20 MG tablet [id number] Yes Take 1 tablet (20 mg total) by mouth daily as needed. if 3 pound weight gain overnight or 5 pounds in a week. Katherine Roan, MD Taking Active            Med Note (RATLIFF, THURSHELL   Mon Oct 25, 2021 10:59 AM) As needed  traMADol (ULTRAM) 50 MG tablet [id number] Yes Take 50 mg by mouth every 6 (six) hours as needed. [provider] Taking Active   venlafaxine XR (EFFEXOR-XR) 37.5 MG 24 hr capsule [id number] Yes Take 2 capsules (75 mg total) by mouth every morning. 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 12/03/2019   Left knee pain 12/03/2019   Moderate to severe pulmonary hypertension ([place]) 11/26/2019   Tremor of both hands 09/29/2019   Burning with urination 08/06/2019   Severe tricuspid regurgitation    S/P hip hemiarthroplasty 08/04/2019   Chronic diarrhea 09/07/2017   Hoarseness of voice 09/07/2017   Total knee replacement status, left 04/25/17 05/06/2017   Primary osteoarthritis of left knee 04/25/2017   DJD (degenerative joint disease) 04/25/2017   Healthcare maintenance 02/08/2017   Vitamin D deficiency 02/08/2017   Microscopic colitis 09/27/2016   IBS (irritable bowel syndrome) 08/31/2016   At risk for falling 01/07/2016   Cystitis 07/10/2015   Chronic combined systolic and diastolic heart failure (HCC)    Rectus sheath hematoma- no  anticoagulation    Atrial fibrillation with RVR- CHADs VASc=4 06/18/2015   Diarrhea 06/12/2015   Advance care planning 10/23/2014   Leg edema 06/27/2012   Medicare annual wellness visit, subsequent 03/27/2012   Depression 03/05/2012   EDEMA 06/07/2010   Osteoporosis 05/23/2010   KNEE PAIN, LEFT 03/02/2010   LBBB (left bundle branch block) 10/28/2008   POST MI SEPTAL DEFECT 06/24/2008   Cough 06/24/2008   Nonischemic cardiomyopathy ([place]) 05/30/2008   ATRIAL FIBRILLATION 05/30/2008   HYPONATREMIA, HX OF 05/30/2008    Immunization History  Administered Date(s) Administered   Fluad Quad(high Dose 65+) 12/14/2018, 01/23/2020   Influenza Whole 12/10/2009, 01/01/2012   Influenza,inj,Quad PF,6+ Mos 12/30/2013, 01/08/2015, 12/30/2015, 12/30/2016, 01/04/2018   Influenza-Unspecified 01/04/2021   PFIZER(Purple Top)SARS-COV-2 Vaccination 05/04/2019, 05/25/2019, 01/12/2020   Pneumococcal Conjugate-13 10/23/2014   Pneumococcal Polysaccharide-23 04/11/2006   Td 05/13/2010   Zoster Recombinat (Shingrix) 01/24/2019, 04/03/2019   Zoster, Live 04/11/2006    Conditions to be addressed/monitored:  Hypertension, Hyperlipidemia, Atrial Fibrillation, Heart Failure, Coronary Artery Disease, Depression, Osteoporosis, and Overactive  Bladder  Care Plan : [place]  Updates made by Charlton Haws, RPH since 03/29/2022 12:00 AM     Problem: Hypertension, Hyperlipidemia, Atrial Fibrillation, Heart Failure, Coronary Artery Disease, Depression, Osteoporosis, and Overactive Bladder   Priority: High     Long-Range Goal: Disease mgmt   Start Date: 03/24/2021  Expected End Date: 09/10/2022  This Visit's Progress: On track  Recent Progress: On track  Priority: High  Note:   Current Barriers:  Neuropathy, urinary incontinence, and reflux are bothersome  Pharmacist Clinical Goal(s):  Patient will adhere to plan to optimize therapeutic regimen as evidenced by report of adherence to  recommended medication management changes through collaboration with PharmD and provider.   Interventions: 1:1 collaboration with [name], MD regarding development and update of comprehensive plan of care as evidenced by provider attestation and co-signature Inter-disciplinary care team collaboration (see longitudinal plan of care) Comprehensive medication review performed; medication list updated in electronic medical record  Hyperlipidemia: (LDL goal < 70) -Controlled - LDL 54 (04/2021) at goal; TRIG 143 -Hx MI. Noted statin intolerance in chart. -Current treatment: Cholestyramine 4 g daily PM (for diarrhea) - Appropriate, Effective, Safe, Accessible -Educated on Cholesterol goals -Recommend to continue current medication  Persistent Atrial Fibrillation (Goal: prevent stroke and major bleeding) -Controlled - pt endorses compliance with medications and denies issues; recent digoxin level was not elevated (04/2021) -CHADSVASC: 6; chronic LBBB; hx of falls -Age 30, wt 65 kg, Cr 0.72 -Current treatment: Metoprolol succinate 100 mg daily -Appropriate, Effective, Safe, Accessible Metoprolol succinate 50 mg daily - Appropriate, Effective, Safe, Accessible Digoxin 0.125 mg - 1/2 tab daily - Appropriate, Effective, Safe, Accessible Eliquis 2.5 mg BID - Appropriate, Effective, Safe, Accessible -Medications previously tried: amiodarone -Reviewed medications; pt would like to get off of digoxin, discussed increased risk of HF exacerbation when stopping digoxin, she can discuss with cardiology -Recommended to continue current medication  Heart Failure / Hypertension (Goal: BP < 140/90, prevent exacerbations) -Controlled -pt has not needed torsemide in a few weeks -Last ejection fraction: 25-30% (Date: 06/2020) -HF type: HFrEF (EF < 40%); NYHA Class: I (no actitivty limitation) -Current home BP/HR readings: SBP 80s-90s; HR 100 -Home wt: 150 lb -Current treatment: Metoprolol succinate 100  mg daily - Appropriate, Effective, Safe, Accessible Metoprolol succinate 50 mg daily - Appropriate, Effective, Safe, Accessible Digoxin 0.125 mg - 1/2 tab daily - Appropriate, Effective, Safe, Accessible Torsemide 20 mg daily PRN - Appropriate, Effective, Safe, Accessible Potassium chloride 20 mEq - 2 tab daily - Appropriate, Effective, Safe, Accessible -Medications previously tried: [place] (weakness), Ace-inhibitor (cough), losartan (low BP) ; cardiologist advised avoid Entresto, spironolactone due to low BP -Educated on BP goals and benefits of medications for prevention of heart attack, stroke and kidney damage;Importance of home blood pressure monitoring;   -pt would like to get off of digoxin, discussed increased risk of HF exacerbation when stopping digoxin, she can discuss with cardiology -Advised to take the torsemide for 2+ lb weight gain; advised to limit salt intake -Advised to check BP when feeling dizzy; if it is low, contact PCP or cardiologist for med adjustment -Recommend to continue current medication  Depression (Goal: manage symptoms) -Not ideally controlled - pt reports she is off all antidepressants, she reports depressed mood -PHQ9: 20 (06/2021) - severe depression -Current treatment: Venlafaxine 37.5 mg - 2 tab daily - Appropriate, Effective, Safe, Accessible -Medications previously tried: sertraline, bupropion -Connected with PCP for mental health support -Discussed s/sx of depression -Recommend to  continue current medication  Osteoporosis (Goal prevent fractures) -Not ideally controlled - pt has no DEXA on file and is not taking medication to improve bone density -Dx from chart review 2012. Hx hip fracture and multiple falls. Pt has declined follow up DEXA scans previously -Current treatment  Vitamin D 1000 IU daily -Assessed her history of hip fracture - appears to be fragility fracture, which qualifies her for treatment of osteoporosis; given significant GI and  reflux issues would avoid oral bisphosphonates; she is a good candidate for Prolia or zoledronic acid injections -Recommended DEXA scan to evaluate bone density - pt declined -Recommend weight-bearing and muscle strengthening exercises for building and maintaining bone density. -Advised to start a calcium supplement daily (at least 600 mg) -Consider starting Prolia injections given hx of hip fracture, hx multiple falls   IBS-D / Lymphocytic Colitis (Goal: manage symtoms) -Controlled - pt reports diarrhea improved with cholestyramine -colitis flare as of 03/09/21; follows with GI;  -Current treatment  Cholestyramine 4 g daily - Appropriate, Effective, Safe, Accessible Loperamide 2 mg PRN -takes 2-3 per day - Appropriate, Effective, Safe, Accessible -Medications previously used: budesonide (flares), prednisone (flares) -Reviewed medications; it is reasonable to use OTC remedies at recommended doses during flare -Advised to follow up with GI for further mgmt of IBS  GERD (Goal: minimize symptoms of reflux ) -Not ideally controlled - pt reports she stopped pantoprazole recently because she thought it was making her voice hoarse/drying out her throat; she will use pepcid or tums occasionally -Current treatment  Famotidine 20 mg PRN - Appropriate, Query Effective Tums PRN - Appropriate, Effective, Safe, Accessible -Medications previously tried: pantoprazole -Reviewed hoarseness can also be a sign of GERD; advised to use famotidine more consistenly/as preventative   OAB (Goal: reduce urinary frequency) -Not ideally controlled - pt reports waking up several times per night to urinate, she feels like she is not completely emptying bladder -Pt stops drinking fluids after about 7 pm -Preferred formulary options: oxybutynin (Tier 1), tolterodine, trospium, solifenacin. Discussed these options and potential side effects, pt reports she already has issues with dry eye and does not want to take another  medication at this time -Current treatment  Cranberry supplement -Medications previously tried: oxybutynin, Myrbetriq (cost) -Advised to contact PCP if issue worsens  Neuropathy (Goal: manage symptoms) -Not ideally controlled - pt reports nerve pain in feet and occasional redness in feet, she reports feet are not particularly cool or warm to touch; -pt ordered special neuropathy cream and reports it is helping a lot; she also reports she was prescribed tramadol and takes 1/2 tablet PRN -Current treatment  Tramadol 50 mg - 1/2 tab PRN - Appropriate, Effective, Safe, Accessible -Medications previously tried: n/a  -Pt is ok continuing with current medication/cream and will contact PCP if needed  Health Maintenance -Vaccine gaps: Flu (Sept 2023 @ [facility] per patient), Covid booster, TDAP -Hand arthritis - wears brace at night, takes Tylenol  Patient Goals/Self-Care Activities Patient will:  - take medications as prescribed as evidenced by patient report and record review focus on medication adherence by pill box check blood pressure when dizzy, document, and provide at future appointments weigh daily, and contact provider if weight gain of 5+ lbs in a week     Medication Assistance: None required.  Patient affirms current coverage meets needs.  Compliance/Adherence/Medication fill history: Care Gaps: None  Star-Rating Drugs: None  Medication Access: Within the past 30 days, how often has patient missed a dose of medication? 0 Is  a pillbox or other method used to improve adherence? Yes  Factors that may affect medication adherence? financial need Are meds synced by current pharmacy? No  Are meds delivered by current pharmacy? No  Does patient experience delays in picking up medications due to transportation concerns? No   [facility] Reviewed: Is patient disadvantaged to use [facility]?: No  Current Rx insurance plan: [facility] [place] Name and location of Current pharmacy:   [id number] [place] [id number]: [phone]: [phone] [facility] services reviewed with patient today?: No  Patient requests to transfer care to [facility]?: No  Reason patient declined to change pharmacies: Prefers to go out and pick up medications themselves   Care Plan and Follow Up Patient Decision:  Patient agrees to Care Plan and Follow-up.  Plan: Telephone follow up appointment with care management team member scheduled for:  6 months  LCharlene Brooke PharmD, BCACP Clinical Pharmacist [phone]

## 2022-03-29 NOTE — Patient Instructions (Signed)
Visit Information  Phone number for Pharmacist: [phone]   Goals Addressed   None     Care Plan : [place]  Updates made by Charlton Haws, RPH since 03/29/2022 12:00 AM     Problem: Hypertension, Hyperlipidemia, Atrial Fibrillation, Heart Failure, Coronary Artery Disease, Depression, Osteoporosis, and Overactive Bladder   Priority: High     Long-Range Goal: Disease mgmt   Start Date: 03/24/2021  Expected End Date: 09/10/2022  This Visit's Progress: On track  Recent Progress: On track  Priority: High  Note:   Current Barriers:  Neuropathy, urinary incontinence, and reflux are bothersome  Pharmacist Clinical Goal(s):  Patient will adhere to plan to optimize therapeutic regimen as evidenced by report of adherence to recommended medication management changes through collaboration with PharmD and provider.   Interventions: 1:1 collaboration with [name], MD regarding development and update of comprehensive plan of care as evidenced by provider attestation and co-signature Inter-disciplinary care team collaboration (see longitudinal plan of care) Comprehensive medication review performed; medication list updated in electronic medical record  Hyperlipidemia: (LDL goal < 70) -Controlled - LDL 54 (04/2021) at goal; TRIG 143 -Hx MI. Noted statin intolerance in chart. -Current treatment: Cholestyramine 4 g daily PM (for diarrhea) - Appropriate, Effective, Safe, Accessible -Educated on Cholesterol goals -Recommend to continue current medication  Persistent Atrial Fibrillation (Goal: prevent stroke and major bleeding) -Controlled - pt endorses compliance with medications and denies issues; recent digoxin level was not elevated (04/2021) -CHADSVASC: 6; chronic LBBB; hx of falls -Age 88, wt 65 kg, Cr 0.72 -Current treatment: Metoprolol succinate 100 mg daily -Appropriate, Effective, Safe, Accessible Metoprolol succinate 50 mg daily - Appropriate,  Effective, Safe, Accessible Digoxin 0.125 mg - 1/2 tab daily - Appropriate, Effective, Safe, Accessible Eliquis 2.5 mg BID - Appropriate, Effective, Safe, Accessible -Medications previously tried: amiodarone -Reviewed medications; pt would like to get off of digoxin, discussed increased risk of HF exacerbation when stopping digoxin, she can discuss with cardiology -Recommended to continue current medication  Heart Failure / Hypertension (Goal: BP < 140/90, prevent exacerbations) -Controlled -pt has not needed torsemide in a few weeks -Last ejection fraction: 25-30% (Date: 06/2020) -HF type: HFrEF (EF < 40%); NYHA Class: I (no actitivty limitation) -Current home BP/HR readings: SBP 80s-90s; HR 100 -Home wt: 150 lb -Current treatment: Metoprolol succinate 100 mg daily - Appropriate, Effective, Safe, Accessible Metoprolol succinate 50 mg daily - Appropriate, Effective, Safe, Accessible Digoxin 0.125 mg - 1/2 tab daily - Appropriate, Effective, Safe, Accessible Torsemide 20 mg daily PRN - Appropriate, Effective, Safe, Accessible Potassium chloride 20 mEq - 2 tab daily - Appropriate, Effective, Safe, Accessible -Medications previously tried: [place] (weakness), Ace-inhibitor (cough), losartan (low BP) ; cardiologist advised avoid Entresto, spironolactone due to low BP -Educated on BP goals and benefits of medications for prevention of heart attack, stroke and kidney damage;Importance of home blood pressure monitoring;   -pt would like to get off of digoxin, discussed increased risk of HF exacerbation when stopping digoxin, she can discuss with cardiology -Advised to take the torsemide for 2+ lb weight gain; advised to limit salt intake -Advised to check BP when feeling dizzy; if it is low, contact PCP or cardiologist for med adjustment -Recommend to continue current medication  Depression (Goal: manage symptoms) -Not ideally controlled - pt reports she is off all antidepressants, she reports  depressed mood -PHQ9: 20 (06/2021) - severe depression -Current treatment: Venlafaxine 37.5 mg - 2 tab daily - Appropriate, Effective, Safe, Accessible -Medications  previously tried: sertraline, bupropion -Connected with PCP for mental health support -Discussed s/sx of depression -Recommend to continue current medication  Osteoporosis (Goal prevent fractures) -Not ideally controlled - pt has no DEXA on file and is not taking medication to improve bone density -Dx from chart review 2012. Hx hip fracture and multiple falls. Pt has declined follow up DEXA scans previously -Current treatment  Vitamin D 1000 IU daily -Assessed her history of hip fracture - appears to be fragility fracture, which qualifies her for treatment of osteoporosis; given significant GI and reflux issues would avoid oral bisphosphonates; she is a good candidate for Prolia or zoledronic acid injections -Recommended DEXA scan to evaluate bone density - pt declined -Recommend weight-bearing and muscle strengthening exercises for building and maintaining bone density. -Advised to start a calcium supplement daily (at least 600 mg) -Consider starting Prolia injections given hx of hip fracture, hx multiple falls   IBS-D / Lymphocytic Colitis (Goal: manage symtoms) -Controlled - pt reports diarrhea improved with cholestyramine -colitis flare as of 03/09/21; follows with GI;  -Current treatment  Cholestyramine 4 g daily - Appropriate, Effective, Safe, Accessible Loperamide 2 mg PRN -takes 2-3 per day - Appropriate, Effective, Safe, Accessible -Medications previously used: budesonide (flares), prednisone (flares) -Reviewed medications; it is reasonable to use OTC remedies at recommended doses during flare -Advised to follow up with GI for further mgmt of IBS  GERD (Goal: minimize symptoms of reflux ) -Not ideally controlled - pt reports she stopped pantoprazole recently because she thought it was making her voice hoarse/drying  out her throat; she will use pepcid or tums occasionally -Current treatment  Famotidine 20 mg PRN - Appropriate, Query Effective Tums PRN - Appropriate, Effective, Safe, Accessible -Medications previously tried: pantoprazole -Reviewed hoarseness can also be a sign of GERD; advised to use famotidine more consistenly/as preventative   OAB (Goal: reduce urinary frequency) -Not ideally controlled - pt reports waking up several times per night to urinate, she feels like she is not completely emptying bladder -Pt stops drinking fluids after about 7 pm -Preferred formulary options: oxybutynin (Tier 1), tolterodine, trospium, solifenacin. Discussed these options and potential side effects, pt reports she already has issues with dry eye and does not want to take another medication at this time -Current treatment  Cranberry supplement -Medications previously tried: oxybutynin, Myrbetriq (cost) -Advised to contact PCP if issue worsens  Neuropathy (Goal: manage symptoms) -Not ideally controlled - pt reports nerve pain in feet and occasional redness in feet, she reports feet are not particularly cool or warm to touch; -pt ordered special neuropathy cream and reports it is helping a lot; she also reports she was prescribed tramadol and takes 1/2 tablet PRN -Current treatment  Tramadol 50 mg - 1/2 tab PRN - Appropriate, Effective, Safe, Accessible -Medications previously tried: n/a  -Pt is ok continuing with current medication/cream and will contact PCP if needed  Health Maintenance -Vaccine gaps: Flu (Sept [facility]23 [facility] patient), Covid booster, TDAP -Hand arthritis - wears brace at night, takes Tylenol  Patient Goals/Self-Care Activities Patient will:  - take medications as prescribed as evidenced by patient report and record review focus on medication adherence by pill box check blood pressure when dizzy, document, and provide at future appointments weigh daily, and contact provider if weight  gain of 5+ lbs in a week      Patient verbalizes understanding of instructions and care plan provided today and agrees to view in MyChart. Active MyChart status and patient understanding of  how to access instructions and care plan via MyChart confirmed with patient.    Telephone follow up appointment with pharmacy team member scheduled for: 6 months  Charlene Brooke, PharmD, [facility] Clinical Pharmacist [facility] [phone]

## 2022-04-09 ENCOUNTER — Encounter: Payer: Self-pay | Admitting: Family Medicine

## 2022-04-11 ENCOUNTER — Other Ambulatory Visit: Payer: Self-pay | Admitting: Internal Medicine

## 2022-04-22 ENCOUNTER — Ambulatory Visit (INDEPENDENT_AMBULATORY_CARE_PROVIDER_SITE_OTHER): Payer: Medicare Other | Admitting: Family Medicine

## 2022-04-22 ENCOUNTER — Encounter: Payer: Self-pay | Admitting: Family Medicine

## 2022-04-22 VITALS — BP 108/72 | HR 60 | Temp 97.8°F | Ht 65.0 in | Wt 157.0 lb

## 2022-04-22 DIAGNOSIS — R0602 Shortness of breath: Secondary | ICD-10-CM

## 2022-04-22 DIAGNOSIS — Z5181 Encounter for therapeutic drug level monitoring: Secondary | ICD-10-CM

## 2022-04-22 DIAGNOSIS — M79673 Pain in unspecified foot: Secondary | ICD-10-CM

## 2022-04-22 DIAGNOSIS — R300 Dysuria: Secondary | ICD-10-CM

## 2022-04-22 LAB — COMPREHENSIVE METABOLIC PANEL
ALT: 11 U/L (ref 0–35)
AST: 20 U/L (ref 0–37)
Albumin: 4 g/dL (ref 3.5–5.2)
Alkaline Phosphatase: 83 U/L (ref 39–117)
BUN: 16 mg/dL (ref 6–23)
CO2: 25 mEq/L (ref 19–32)
Calcium: 9.5 mg/dL (ref 8.4–10.5)
Chloride: 98 mEq/L (ref 96–112)
Creatinine, Ser: 0.72 mg/dL (ref 0.40–1.20)
GFR: 74.25 mL/min (ref >60.00)
Glucose, Bld: 89 mg/dL (ref 70–99)
Potassium: 4.8 mEq/L (ref 3.5–5.1)
Sodium: 132 mEq/L — ABNORMAL LOW (ref 135–145)
Total Bilirubin: 0.7 mg/dL (ref 0.2–1.2)
Total Protein: 6.6 g/dL (ref 6.0–8.3)

## 2022-04-22 LAB — CBC WITH DIFFERENTIAL/PLATELET
Basophils Absolute: 0 10*3/uL (ref 0.0–0.1)
Basophils Relative: 0.5 % (ref 0.0–3.0)
Eosinophils Absolute: 0.1 10*3/uL (ref 0.0–0.7)
Eosinophils Relative: 1.3 % (ref 0.0–5.0)
HCT: 42.6 % (ref 36.0–46.0)
Hemoglobin: 14 g/dL (ref 12.0–15.0)
Lymphocytes Relative: 23.6 % (ref 12.0–46.0)
Lymphs Abs: 1.7 10*3/uL (ref 0.7–4.0)
MCHC: 32.8 g/dL (ref 30.0–36.0)
MCV: 93.2 fl (ref 78.0–100.0)
Monocytes Absolute: 0.5 10*3/uL (ref 0.1–1.0)
Monocytes Relative: 6.7 % (ref 3.0–12.0)
Neutro Abs: 4.8 10*3/uL (ref 1.4–7.7)
Neutrophils Relative %: 67.9 % (ref 43.0–77.0)
Platelets: 205 10*3/uL (ref 150.0–400.0)
RBC: 4.57 Mil/uL (ref 3.87–5.11)
RDW: 13.5 % (ref 11.5–15.5)
WBC: 7 10*3/uL (ref 4.0–10.5)

## 2022-04-22 LAB — POC URINALSYSI DIPSTICK (AUTOMATED)
Bilirubin, UA: NEGATIVE
Glucose, UA: NEGATIVE
Ketones, UA: NEGATIVE
Nitrite, UA: POSITIVE
Protein, UA: NEGATIVE
Spec Grav, UA: 1.015 (ref 1.010–1.025)
Urobilinogen, UA: 0.2 E.U./dL
pH, UA: 5.5 (ref 5.0–8.0)

## 2022-04-22 LAB — BRAIN NATRIURETIC PEPTIDE: Pro B Natriuretic peptide (BNP): 2071 pg/mL — ABNORMAL HIGH (ref 0.0–100.0)

## 2022-04-22 MED ORDER — CEPHALEXIN 500 MG PO CAPS: 500.0000 mg | ORAL_CAPSULE | Freq: Three times a day (TID) | ORAL | 0 refills | Status: DC

## 2022-04-22 MED ORDER — METOPROLOL SUCCINATE ER 100 MG PO TB24: 150.0000 mg | ORAL_TABLET | Freq: Every day | ORAL | Status: DC

## 2022-04-22 NOTE — Patient Instructions (Signed)
Go to the lab on the way out.   If you have mychart we'll likely use that to update you.    Start keflex.  Try to elevate the head of your bed.  If not better, try taking torsemide once.   Take care.  Glad to see you.

## 2022-04-22 NOTE — Progress Notes (Signed)
dysuria: yes, nocturia, urgency, incomplete voiding.   duration of symptoms: weeks.   abdominal pain: no fevers:no back pain: at baseline, attributed to muscle soreness.   vomiting: no vomiting but some nausea  Burning/aching in the toes, skin is overly sensitive, on both feet. Variable severity.  Going on for about 1 month.  Tramadol helps.    She has SOB and wheeze when supine.  Not noted when upright.  Going on for about 1 month.  No recent torsemide use or need.  Weight is stable.    Meds, vitals, and allergies reviewed.   Per HPI unless specifically indicated in ROS section   GEN: nad, alert and oriented HEENT: ncat NECK: supple CV: IRR PULM: ctab, no inc wob ABD: soft, +bs, suprapubic area not tender EXT: no edema SKIN: no acute rash BACK: no CVA pain She has normal capillary refill on the toes bilaterally.  No ulceration.

## 2022-04-24 DIAGNOSIS — M79673 Pain in unspecified foot: Secondary | ICD-10-CM | POA: Insufficient documentation

## 2022-04-24 DIAGNOSIS — R0602 Shortness of breath: Secondary | ICD-10-CM | POA: Insufficient documentation

## 2022-04-24 LAB — URINE CULTURE
MICRO NUMBER:: 14424874
SPECIMEN QUALITY:: ADEQUATE

## 2022-04-24 NOTE — Assessment & Plan Note (Signed)
Urinalysis discussed with patient.  Presumed UTI.  Start Keflex.  See culture report.

## 2022-04-24 NOTE — Assessment & Plan Note (Signed)
Would treat presumed cystitis in the meantime.  Use tramadol as needed for foot pain.  We can work this up more if it persist.

## 2022-04-24 NOTE — Assessment & Plan Note (Signed)
Noted when supine.  Lungs clear at exam.  Not an acute issue.  Discussed elevating the head of the bed and if that does not help then taking torsemide once.  See notes on labs.

## 2022-04-28 ENCOUNTER — Other Ambulatory Visit: Payer: Self-pay | Admitting: Cardiovascular Disease

## 2022-04-28 DIAGNOSIS — I4891 Unspecified atrial fibrillation: Secondary | ICD-10-CM

## 2022-04-28 NOTE — Telephone Encounter (Signed)
Prescription refill request for Eliquis received. Indication: AF Last office visit: 01/12/22  Jerilynn Mages Croitoru MD Scr: 0.72 on 04/22/22 Age: 89 Weight: 69.5kg  Pt is on Eliquis 2.5mg  twice daily due to age and fall risk per Dr C.  Will continue current dose.  Refill approved.

## 2022-05-04 DIAGNOSIS — Z961 Presence of intraocular lens: Secondary | ICD-10-CM | POA: Diagnosis not present

## 2022-05-04 DIAGNOSIS — H1789 Other corneal scars and opacities: Secondary | ICD-10-CM | POA: Diagnosis not present

## 2022-05-04 DIAGNOSIS — H5203 Hypermetropia, bilateral: Secondary | ICD-10-CM | POA: Diagnosis not present

## 2022-05-04 DIAGNOSIS — H35373 Puckering of macula, bilateral: Secondary | ICD-10-CM | POA: Diagnosis not present

## 2022-05-06 DIAGNOSIS — M79641 Pain in right hand: Secondary | ICD-10-CM | POA: Diagnosis not present

## 2022-05-10 ENCOUNTER — Encounter: Payer: Self-pay | Admitting: Cardiovascular Disease

## 2022-05-11 NOTE — Progress Notes (Signed)
Cardiology Office Note:    Date:  05/12/2022   ID:  [name], DOB July 03, 1930, MRN [id number]  PCP:  [name], MD   [place] [facility] Cardiologist:  Sanda Klein, MD     Referring MD: [name], MD   No chief complaint on file. Weight gain  History of Present Illness:    [name] is a 89 y.o. female with a hx of  female with a history of longstanding persistent atrial fibrillation, chronic systolic heart failure since 2019 (EF dropped to 15% in setting of A. fib with RVR during hospital stay for hip fracture) with incomplete recovery (EF 35% in 2021, 25-30% by most recent echo in 06/24/2020), pulmonary artery hypertension (estimated systolic PAP 83 mm by echo in April 2021, with severe tricuspid regurgitation and abnormal ventricular septal flattening improved to systolic PAP 38 mmHg by echo with moderate TR in 06/24/2020).  She has severe biatrial dilation and chronic LBBB.  She has a history of spontaneous retroperitoneal bleed on warfarin many years ago.   Patient of Dr. Sallyanne Kuster with weight gain. She takes torsemide as needed for weight gain. Last time she saw him she weighed 153 pounds in the office. She is in rate controlled atria fibrillation. Today her weight is 154. She reports taking torsemide 20 mg daily every 2-3 days. Weight has been going up. She's been wheezing and coughing. Feels a tightness in the chest. No fevers or chills.  Some sob in the AM. Elevating her head can help.   04/22/2022 Crt 0.72 BNP 2,071 Normal LFTs  Past Medical History:  Diagnosis Date   Acute combined systolic and diastolic heart failure ([place]) 05/02/2017   Anemia    Anxiety    Arthritis    "knees" (06/18/2015)   Atrial fibrillation with RVR ([place]) 06/18/2015   h/o sig bleed on coumadin   Cardiogenic shock 17/61/6073   Complication of anesthesia 2010   "w/cardiac cath; got into a psychotic state for ~ 3 days; got me out w/Valium"   Depression    "I have  it off and on; not as often as I've gotten older" (06/18/2015)   Diverticulosis    descending and sigmoid colon--Dr. Sharlett Iles   DJD (degenerative joint disease) of knee    left   Dyspnea    with exertion and in morning mostly when wakes up    E coli infection    History of   GERD (gastroesophageal reflux disease)    History of blood transfusion    History of Clostridium difficile colitis    IBS (irritable bowel syndrome)    Insomnia     off chronic ambien 5mg  as of 10/11, rare/episodic use since then  DCM/CHF   LBBB (left bundle Kandy Towery block)    Lymphocytic colitis 09/27/2016   Moderate to severe pulmonary hypertension ([place]) 11/26/2019   Nonischemic cardiomyopathy ([place])    normal coronaries, EF 15-20% 04/2008, EF 50-55% 2015   Osteoporosis    on DXA 05/2010   Positive PPD    Retroperitoneal bleed    Sciatica    Right   Urinary incontinence    Occassionally    Past Surgical History:  Procedure Laterality Date   ANTERIOR APPROACH HEMI HIP ARTHROPLASTY Left 08/05/2019   Procedure: ANTERIOR APPROACH HEMI HIP ARTHROPLASTY;  Surgeon: Dorna Leitz, MD;  Location: [place];  Service: Orthopedics;  Laterality: Left;   BIOPSY  11/22/2017   Procedure: BIOPSY;  Surgeon: Gatha Mayer, MD;  Location:  [facility];  Service: Endoscopy;;   CARDIAC CATHETERIZATION  2010   CARDIOVERSION N/A 06/19/2015   Procedure: CARDIOVERSION;  Surgeon: Larey Dresser, MD;  Location: [place];  Service: Cardiovascular;  Laterality: N/A;   CATARACT EXTRACTION W/ INTRAOCULAR LENS  IMPLANT, BILATERAL Bilateral    COLONOSCOPY WITH PROPOFOL N/A 11/22/2017   Procedure: COLONOSCOPY WITH PROPOFOL;  Surgeon: Gatha Mayer, MD;  Location: [facility];  Service: Endoscopy;  Laterality: N/A;   DILATION AND CURETTAGE OF UTERUS     ORIF ANKLE FRACTURE Right 09/22/2020   Procedure: RIGHT ANKLE OPEN REDUCTION INTERNAL FIXATION (ORIF);  Surgeon: Melrose Nakayama, MD;  Location: [facility];  Service: Orthopedics;  Laterality:  Right;   TEE WITHOUT CARDIOVERSION N/A 06/19/2015   Procedure: TRANSESOPHAGEAL ECHOCARDIOGRAM (TEE);  Surgeon: Larey Dresser, MD;  Location: [place];  Service: Cardiovascular;  Laterality: N/A;   TOTAL KNEE ARTHROPLASTY Left 04/25/2017   Procedure: TOTAL KNEE ARTHROPLASTY;  Surgeon: Melrose Nakayama, MD;  Location: [place];  Service: Orthopedics;  Laterality: Left;   TOTAL KNEE ARTHROPLASTY Right 02/18/2020   Procedure: RIGHT TOTAL KNEE ARTHROPLASTY;  Surgeon: Melrose Nakayama, MD;  [facility];  Service: Orthopedics;  Laterality: Right;   TOTAL KNEE ARTHROPLASTY Right 06/16/2020   Procedure: RIGHT RETINACULAR REPAIR;  Surgeon: Melrose Nakayama, MD;  [facility];  Service: Orthopedics;  Laterality: Right;   TOTAL KNEE REVISION Left 12/03/2019   Procedure: LEFT TOTAL KNEE REVISION  OF POYLY EXCHANGE;  Surgeon: Melrose Nakayama, MD;  Location: [facility];  Service: Orthopedics;  Laterality: Left;   VAGINAL HYSTERECTOMY  1979   ovaries intact    Current Medications: Current Meds  Medication Sig   acetaminophen (TYLENOL) 500 MG tablet Take 1,000 mg by mouth in the morning and at bedtime.   apixaban (ELIQUIS) 2.5 MG TABS tablet TAKE 1 TABLET BY MOUTH TWICE A DAY   calcium carbonate (TUMS - DOSED IN MG ELEMENTAL CALCIUM) 500 MG chewable tablet Chew 500 mg by mouth 2 (two) times daily as needed for indigestion or heartburn.   cephALEXin (KEFLEX) 500 MG capsule Take 1 capsule (500 mg total) by mouth 3 (three) times daily.   cholecalciferol (VITAMIN D3) 25 MCG (1000 UNIT) tablet Take 1,000 Units by mouth in the morning.   cholestyramine (QUESTRAN) 4 g packet TAKE 1 PACKET (4 G TOTAL) BY MOUTH DAILY WITH SUPPER.   Cranberry 180 MG CAPS Take by mouth.   digoxin (LANOXIN) 0.125 MG tablet Take 0.5 tablets (0.0625 mg total) by mouth daily.   famotidine (PEPCID) 20 MG tablet Take 20 mg by mouth daily as needed for heartburn or indigestion.   fluticasone (FLONASE) 50 MCG/ACT nasal spray Place 2 sprays  into both nostrils daily.   KLOR-CON M20 20 MEQ tablet TAKE 2 TABLETS BY MOUTH DAILY   loperamide (IMODIUM) 2 MG capsule Take 2 mg by mouth 3 (three) times daily as needed for diarrhea or loose stools.    metoprolol succinate (TOPROL-XL) 100 MG 24 hr tablet Take 1.5 tablets (150 mg total) by mouth daily.   torsemide (DEMADEX) 20 MG tablet Take 1 tablet (20 mg total) by mouth daily as needed. if 3 pound weight gain overnight or 5 pounds in a week.   traMADol (ULTRAM) 50 MG tablet Take 50 mg by mouth every 6 (six) hours as needed.   venlafaxine XR (EFFEXOR-XR) 37.5 MG 24 hr capsule Take 2 capsules (75 mg total) by mouth every morning.     Allergies:   Ace inhibitors, Warfarin sodium,  Famotidine, Vancomycin, Codeine, Delsym [dextromethorphan polistirex er], Lactose intolerance (gi), Lasix [furosemide], Phenylephrine, and Sulfamethoxazole-trimethoprim   Social History   Socioeconomic History   Marital status: Married    Spouse name: Sonia Side    Number of children: 2   Years of education: 13   Highest education level: Not on file  Occupational History   Occupation: Retired    Fish farm manager: RETIRED  Tobacco Use   Smoking status: Never   Smokeless tobacco: Never  Vaping Use   Vaping Use: Never used  Substance and Sexual Activity   Alcohol use: No    Alcohol/week: 0.0 standard drinks of alcohol   Drug use: No   Sexual activity: Not Currently    Birth control/protection: Post-menopausal, Surgical    Comment: Hysterectomy  Other Topics Concern   Not on file  Social History Narrative   Retired, former [employer]   Married, [place]   Lives with husband   2 grown children, one in [place], one out of state   Tobacco Use - No.    Drug Use - no   teaches Sunday school, reading   Social Determinants of Health   Financial Resource Strain: [place]  (07/12/2021)   Overall Financial Resource Strain (CARDIA)    Difficulty of Paying Living Expenses: Not hard at all  Food Insecurity: No Food  Insecurity (07/12/2021)   Hunger Vital Sign    Worried About Running Out of Food in the Last Year: Never true    [place] in the Last Year: Never true  Transportation Needs: No Transportation Needs (07/12/2021)   PRAPARE - Hydrologist (Medical): No    Lack of Transportation (Non-Medical): No  Physical Activity: Insufficiently Active (07/12/2021)   Exercise Vital Sign    Days of Exercise per Week: 5 days    Minutes of Exercise per Session: 20 min  Stress: Stress Concern Present (07/12/2021)   [place]    Feeling of Stress : To some extent  Social Connections: Socially Integrated (07/12/2021)   Social Connection and Isolation Panel [NHANES]    Frequency of Communication with Friends and Family: More than three times a week    Frequency of Social Gatherings with Friends and Family: More than three times a week    Attends Religious Services: 1 to 4 times per year    Active Member of [employer] or Organizations: Yes    Attends Archivist Meetings: 1 to 4 times per year    Marital Status: Married     Family History: The patient's family history includes Colon cancer in her father; Other in her father; Tuberculosis in her mother. There is no history of Breast cancer, Stomach cancer, or Pancreatic cancer.  ROS:   Please see the history of present illness.     All other systems reviewed and are negative.  EKGs/Labs/Other Studies Reviewed:    The following studies were reviewed today:   EKG:  EKG is  ordered today.  The ekg ordered today demonstrates   05/12/2022-Atrial fibrillation HR 97, LBBB  Recent Labs: 04/22/2022: ALT 11; BUN 16; Creatinine, Ser 0.72; Hemoglobin 14.0; Platelets 205.0; Potassium 4.8; Pro B Natriuretic peptide (BNP) 2,071.0; Sodium 132   Recent Lipid Panel    Component Value Date/Time   CHOL 144 05/04/2021 1330   CHOL 156 02/05/2018 0937   TRIG 143.0 05/04/2021  1330   HDL 60.50 05/04/2021 1330   HDL 58 02/05/2018  1696   CHOLHDL 2 05/04/2021 1330   VLDL 28.6 05/04/2021 1330   LDLCALC 54 05/04/2021 1330   LDLCALC 73 02/05/2018 0937   LDLDIRECT 58.0 12/27/2018 1657     Risk Assessment/Calculations:           Physical Exam:    VS:  Vitals:   05/12/22 1119  BP: 116/86  Pulse: 97  SpO2: 97%      BP 116/86   Pulse 97   Ht 5\' 5"  (1.651 m)   Wt 154 lb (69.9 kg)   SpO2 97%   BMI 25.63 kg/m     Wt Readings from Last 3 Encounters:  05/12/22 154 lb (69.9 kg)  04/22/22 157 lb (71.2 kg)  02/11/22 155 lb 6 oz (70.5 kg)     GEN:  Well nourished, well developed in no acute distress HEENT: Normal NECK: + JVD;  LYMPHATICS: No lymphadenopathy CARDIAC: IRRR, no murmurs, rubs, gallops RESPIRATORY:  nl wob, fine crackles in the bases ABDOMEN: Soft, non-tender, non-distended MUSCULOSKELETAL:  No edema; No deformity  SKIN: Warm and dry NEUROLOGIC:  Alert and oriented x 3 PSYCHIATRIC:  Normal affect   ASSESSMENT:    NICM NYHA Class III-IV EF 25-30%. RV is mildly reduced, RV size is mildly enlarged, RVSP 38 mmHg, mild MR. She has signs of decompensated CHF. BNP is significantly elevated. She struggles with 20 mg of torsemide, we agreed to 10 mg of torsemide daily. Otherwise,  she has been on GDMT that she can tolerate. With LBBB, Dr. 13/03/23 has mentioned CRT, we discussed this today. She is more amenable. She would like to discuss this with her primary cardiologist.   PLAN:    In order of problems listed above:  Torsemide 10 mg daily BNP,BMP Follow up with Dr. Royann Shivers 05/20/2022           Medication Adjustments/Labs and Tests Ordered: Current medicines are reviewed at length with the patient today.  Concerns regarding medicines are outlined above.  Orders Placed This Encounter  Procedures   Basic metabolic panel   Brain natriuretic peptide   EKG 12-Lead   No orders of the defined types were placed in this  encounter.   Patient Instructions  Medication Instructions:  Take Torsemide 10mg  daily (1/2 tablet) until your appointment with Dr 07/19/2022 *If you need a refill on your cardiac medications before your next appointment, please call your pharmacy*  Lab Work: BMET and BNP If you have labs (blood work) drawn today and your tests are completely normal, you will receive your results only by: MyChart Message (if you have MyChart) OR A paper copy in the mail If you have any lab test that is abnormal or we need to change your treatment, we will call you to review the results.   Follow-Up: At [facility], you and your health needs are our priority.  As part of our continuing mission to provide you with exceptional heart care, we have created designated Provider Care Teams.  These Care Teams include your primary Cardiologist (physician) and Advanced Practice Providers (APPs -  Physician Assistants and Nurse Practitioners) who all work together to provide you with the care you need, when you need it.  We recommend signing up for the patient portal called "MyChart".  Sign up information is provided on this After Visit Summary.  MyChart is used to connect with patients for Virtual Visits (Telemedicine).  Patients are able to view lab/test results, encounter notes, upcoming appointments, etc.  Non-urgent messages can be  sent to your provider as well.   To learn more about what you can do with MyChart, go to [email or web address].    Your next appointment:    Dr Royann Shivers 05/20/22 at 1:30pm     Signed, Maisie Fus, MD  05/12/2022 11:51 AM    [facility]

## 2022-05-11 NOTE — Telephone Encounter (Signed)
Spoke with patient of Dr. Loletha Grayer - advised he recommended a f/u this week. Scheduled for 2/1 with Phineas Inches MD (DOD)

## 2022-05-11 NOTE — Telephone Encounter (Signed)
Spoke with patient of Dr. Sallyanne Kuster  Offered her an appointment on 1/31 with Isaac Laud PA She declined this  She is taking PRN torsemide with improvements in weight.  She reports fullness in her chest and hoarseness  She reports she went several weeks without having to take torsemide but she has been taking several times a week here lately   She has OV on 2/9 Advised to call back if worsening symptoms or acute change in her status

## 2022-05-11 NOTE — Telephone Encounter (Signed)
Agree with office visit today.

## 2022-05-12 ENCOUNTER — Ambulatory Visit: Payer: Medicare Other | Attending: Internal Medicine | Admitting: Internal Medicine

## 2022-05-12 ENCOUNTER — Encounter: Payer: Self-pay | Admitting: Internal Medicine

## 2022-05-12 VITALS — BP 116/86 | HR 97 | Ht 65.0 in | Wt 154.0 lb

## 2022-05-12 DIAGNOSIS — I5042 Chronic combined systolic (congestive) and diastolic (congestive) heart failure: Secondary | ICD-10-CM | POA: Diagnosis not present

## 2022-05-12 NOTE — Patient Instructions (Signed)
Medication Instructions:  Take Torsemide 10mg  daily (1/2 tablet) until your appointment with Dr Sallyanne Kuster *If you need a refill on your cardiac medications before your next appointment, please call your pharmacy*  Lab Work: BMET and BNP If you have labs (blood work) drawn today and your tests are completely normal, you will receive your results only by: [place] (if you have MyChart) OR A paper copy in the mail If you have any lab test that is abnormal or we need to change your treatment, we will call you to review the results.   Follow-Up: At [facility], you and your health needs are our priority.  As part of our continuing mission to provide you with exceptional heart care, we have created designated Provider Care Teams.  These Care Teams include your primary Cardiologist (physician) and Advanced Practice Providers (APPs -  Physician Assistants and Nurse Practitioners) who all work together to provide you with the care you need, when you need it.  We recommend signing up for the patient portal called "MyChart".  Sign up information is provided on this After Visit Summary.  MyChart is used to connect with patients for Virtual Visits (Telemedicine).  Patients are able to view lab/test results, encounter notes, upcoming appointments, etc.  Non-urgent messages can be sent to your provider as well.   To learn more about what you can do with MyChart, go to [email or web address].    Your next appointment:    Dr Sallyanne Kuster 05/20/22 at 1:30pm

## 2022-05-13 LAB — BASIC METABOLIC PANEL
BUN/Creatinine Ratio: 26 (ref 12–28)
BUN: 20 mg/dL (ref 8–27)
CO2: 23 mmol/L (ref 20–29)
Calcium: 9.8 mg/dL (ref 8.7–10.3)
Chloride: 99 mmol/L (ref 96–106)
Creatinine, Ser: 0.76 mg/dL (ref 0.57–1.00)
Glucose: 88 mg/dL (ref 70–99)
Potassium: 5 mmol/L (ref 3.5–5.2)
Sodium: 136 mmol/L (ref 134–144)
eGFR: 75 mL/min/{1.73_m2} (ref >59)

## 2022-05-13 LAB — BRAIN NATRIURETIC PEPTIDE: BNP: 1425.8 pg/mL — ABNORMAL HIGH (ref 0.0–100.0)

## 2022-05-17 ENCOUNTER — Encounter: Payer: Self-pay | Admitting: Internal Medicine

## 2022-05-20 ENCOUNTER — Encounter: Payer: Self-pay | Admitting: Cardiovascular Disease

## 2022-05-20 ENCOUNTER — Ambulatory Visit: Payer: Medicare Other | Attending: Cardiovascular Disease | Admitting: Cardiovascular Disease

## 2022-05-20 VITALS — BP 120/80 | HR 82 | Ht 65.0 in | Wt 152.2 lb

## 2022-05-20 DIAGNOSIS — I700 Atherosclerosis of aorta: Secondary | ICD-10-CM

## 2022-05-20 DIAGNOSIS — I2721 Secondary pulmonary arterial hypertension: Secondary | ICD-10-CM

## 2022-05-20 DIAGNOSIS — I5042 Chronic combined systolic (congestive) and diastolic (congestive) heart failure: Secondary | ICD-10-CM | POA: Diagnosis not present

## 2022-05-20 DIAGNOSIS — D6869 Other thrombophilia: Secondary | ICD-10-CM

## 2022-05-20 DIAGNOSIS — I071 Rheumatic tricuspid insufficiency: Secondary | ICD-10-CM

## 2022-05-20 DIAGNOSIS — I4821 Permanent atrial fibrillation: Secondary | ICD-10-CM | POA: Diagnosis not present

## 2022-05-20 DIAGNOSIS — Z9181 History of falling: Secondary | ICD-10-CM | POA: Diagnosis not present

## 2022-05-20 DIAGNOSIS — I447 Left bundle-branch block, unspecified: Secondary | ICD-10-CM | POA: Diagnosis not present

## 2022-05-20 MED ORDER — TORSEMIDE 10 MG PO TABS: 10.0000 mg | ORAL_TABLET | Freq: Every day | ORAL | 3 refills | Status: DC

## 2022-05-20 NOTE — Patient Instructions (Signed)
Medication Instructions:  Torsemide 72m daily -- new prescription sent to pharmacy   Home DRY WEIGHT of 149lbs If you are above this, please increase torsemide to 212mthat day    CONTINUE all other current medications  *If you need a refill on your cardiac medications before your next appointment, please call your pharmacy*   Follow-Up: [facility].  As part of our continuing mission to provide you with exceptional heart care, we have created designated Provider Care Teams.  These Care Teams include your primary Cardiologist (physician) and Advanced Practice Providers (APPs -  Physician Assistants and Nurse Practitioners) who all work together to provide you with the care you need, when you need it.  We recommend signing up for the patient portal called "MyChart".  Sign up information is provided on this After Visit Summary.  MyChart is used to connect with patients for Virtual Visits (Telemedicine).  Patients are able to view lab/test results, encounter notes, upcoming appointments, etc.  Non-urgent messages can be sent to your provider as well.   To learn more about what you can do with MyChart, go to [email or web address]   Your next appointment:    6 months with Dr. CrSallyanne Kuster

## 2022-05-20 NOTE — Progress Notes (Signed)
Cardiology Office Note:    Date:  05/20/2022   ID:  [name], DOB 1930-07-13, MRN [id number]  PCP:  [name], MD   [facility] Providers Cardiologist:  Sanda Klein, MD     Referring MD: [name], MD   No chief complaint on file.    History of Present Illness:    [name] is a 89 y.o. female with a history of longstanding persistent atrial fibrillation, chronic systolic heart failure since 2019 (EF dropped to 15% in setting of A. fib with RVR during hospital stay for hip fracture) with incomplete recovery (EF 35% in 2021, 25-30% by most recent echo in 06/24/2020), pulmonary artery hypertension (estimated systolic PAP 83 mm by echo in April 2021, with severe tricuspid regurgitation and abnormal ventricular septal flattening improved to systolic PAP 38 mmHg by echo with moderate TR in 06/24/2020).  She has severe biatrial dilation and chronic LBBB.  She has a history of spontaneous retroperitoneal bleed on warfarin many years ago.  She has a history of microscopic colitis.  Has rather fragile compensation of her congestive heart failure, but has not required hospitalization with heart failure since March 2022.    She did require an office visit with adjustment of her diuretics just last week as she had developed severe shortness of breath and orthopnea.  Since then she has been taking torsemide 10 mg daily and she feels much better with reduced exertional dyspnea, although she still has fatigue.  She complains about frequent urination which prevents any social activities and increases the risk of incontinence.  It seems that she has not always been during to a weight-based prescription for her diuretic.  She is able to now sleep fully supine.  On her home scale today she weighed 147 pounds, 152 pounds on our office scale.  It seems that she is probably lost about 2 pounds of "real weight".  She seems to have a very narrow compensation range, goes into heart  failure when she gains only 4-5 pounds.  She has not tolerated treatment with RAAS inhibitors due to variety of side effects including hypotension.  She has frequent urinary tract infections which make her a poor candidate for SGLT2 inhibitors.She has not had any bleeding on apixaban.  Rate control was achieved with a combination of digoxin and metoprolol.  She was intolerant to [place] which caused extreme weakness so that she could barely stand up. She has a history of cough with ACE inhibitors.  Losartan 25 mg was also started during her hospitalization in March 2022, but has since been discontinued due to fatigue and low blood pressure.   At one point, amiodarone was used for rate control but this was also stopped due to side effects.   her metoprolol dose was increased at her June appointment 250 mg daily and today rate control is good.  I do not think she has ever had a cardiac catheterization.  The nuclear stress test in 2019 when she presented with atrial fibrillation rapid ventricular response and severe reduction in LVEF showed a "potential subtle apical, distal septal and distal anteroseptal ischemia" with abnormalities on both rest and stress studies and septal dyskinesia with apex "nearly akinetic".  EF was 23%.  On my review, all these findings are readily explained by LBBB related artifact, especially in a patient with rapid rates.  Past Medical History:  Diagnosis Date   Acute combined systolic and diastolic heart failure ([place]) 05/02/2017   Anemia  Anxiety    Arthritis    "knees" (06/18/2015)   Atrial fibrillation with RVR ([place]) 06/18/2015   h/o sig bleed on coumadin   Cardiogenic shock [id number]   Complication of anesthesia 2010   "w/cardiac cath; got into a psychotic state for ~ 3 days; got me out w/Valium"   Depression    "I have it off and on; not as often as I've gotten older" (06/18/2015)   Diverticulosis    descending and sigmoid colon--Dr. Sharlett Iles   DJD (degenerative  joint disease) of knee    left   Dyspnea    with exertion and in morning mostly when wakes up    E coli infection    History of   GERD (gastroesophageal reflux disease)    History of blood transfusion    History of Clostridium difficile colitis    IBS (irritable bowel syndrome)    Insomnia     off chronic ambien 62m as of 10/11, rare/episodic use since then  DCM/CHF   LBBB (left bundle branch block)    Lymphocytic colitis 09/27/2016   Moderate to severe pulmonary hypertension ([place] 11/26/2019   Nonischemic cardiomyopathy ([facility]    normal coronaries, EF 15-20% 04/2008, EF 50-55% 2015   Osteoporosis    on DXA 05/2010   Positive PPD    Retroperitoneal bleed    Sciatica    Right   Urinary incontinence    Occassionally    Past Surgical History:  Procedure Laterality Date   ANTERIOR APPROACH HEMI HIP ARTHROPLASTY Left 08/05/2019   Procedure: ANTERIOR APPROACH HEMI HIP ARTHROPLASTY;  Surgeon: GDorna Leitz MD;  [place]  Service: Orthopedics;  Laterality: Left;   BIOPSY  11/22/2017   Procedure: BIOPSY;  Surgeon: GGatha Mayer MD;  [facility];  Service: Endoscopy;;   CARDIAC CATHETERIZATION  2010   CARDIOVERSION N/A 06/19/2015   Procedure: CARDIOVERSION;  Surgeon: DLarey Dresser MD;  [place]  Service: Cardiovascular;  Laterality: N/A;   CATARACT EXTRACTION W/ INTRAOCULAR LENS  IMPLANT, BILATERAL Bilateral    COLONOSCOPY WITH PROPOFOL N/A 11/22/2017   Procedure: COLONOSCOPY WITH PROPOFOL;  Surgeon: GGatha Mayer MD;  [facility];  Service: Endoscopy;  Laterality: N/A;   DILATION AND CURETTAGE OF UTERUS     ORIF ANKLE FRACTURE Right 09/22/2020   Procedure: RIGHT ANKLE OPEN REDUCTION INTERNAL FIXATION (ORIF);  Surgeon: DMelrose Nakayama MD;  [facility];  Service: Orthopedics;  Laterality: Right;   TEE WITHOUT CARDIOVERSION N/A 06/19/2015   Procedure: TRANSESOPHAGEAL ECHOCARDIOGRAM (TEE);  Surgeon: DLarey Dresser MD;  [place]  Service: Cardiovascular;  Laterality: N/A;   TOTAL KNEE ARTHROPLASTY Left 04/25/2017   Procedure: TOTAL KNEE ARTHROPLASTY;  Surgeon: DMelrose Nakayama MD;  [place]  Service: Orthopedics;  Laterality: Left;   TOTAL KNEE ARTHROPLASTY Right 02/18/2020   Procedure: RIGHT TOTAL KNEE ARTHROPLASTY;  Surgeon: DMelrose Nakayama MD;  [facility];  Service: Orthopedics;  Laterality: Right;   TOTAL KNEE ARTHROPLASTY Right 06/16/2020   Procedure: RIGHT RETINACULAR REPAIR;  Surgeon: DMelrose Nakayama MD;  [facility];  Service: Orthopedics;  Laterality: Right;   TOTAL KNEE REVISION Left 12/03/2019   Procedure: LEFT TOTAL KNEE REVISION  OF POYLY EXCHANGE;  Surgeon: DMelrose Nakayama MD;  [facility];  Service: Orthopedics;  Laterality: Left;   VAGINAL HYSTERECTOMY  1979   ovaries intact    Current Medications: Current Meds  Medication Sig   acetaminophen (TYLENOL) 500 MG tablet Take 1,000 mg by  mouth in the morning and at bedtime.   apixaban (ELIQUIS) 2.5 MG TABS tablet TAKE 1 TABLET BY MOUTH TWICE A DAY   calcium carbonate (TUMS - DOSED IN MG ELEMENTAL CALCIUM) 500 MG chewable tablet Chew 500 mg by mouth 2 (two) times daily as needed for indigestion or heartburn.   cholecalciferol (VITAMIN D3) 25 MCG (1000 UNIT) tablet Take 1,000 Units by mouth in the morning.   cholestyramine (QUESTRAN) 4 g packet TAKE 1 PACKET (4 G TOTAL) BY MOUTH DAILY WITH SUPPER.   Cranberry 180 MG CAPS Take 180 mg by mouth daily in the afternoon.   digoxin (LANOXIN) 0.125 MG tablet Take 0.5 tablets (0.0625 mg total) by mouth daily.   fluticasone (FLONASE) 50 MCG/ACT nasal spray Place 2 sprays into both nostrils daily.   KLOR-CON M20 20 MEQ tablet TAKE 2 TABLETS BY MOUTH DAILY (Patient taking differently: Take 20 mEq by mouth 2 (two) times daily.)   loperamide (IMODIUM) 2 MG capsule Take 2 mg by mouth 3 (three) times daily as needed for diarrhea or loose stools.    loratadine (CLARITIN) 10 MG tablet Take  10 mg by mouth daily.   metoprolol succinate (TOPROL-XL) 100 MG 24 hr tablet Take 1.5 tablets (150 mg total) by mouth daily.   venlafaxine XR (EFFEXOR-XR) 37.5 MG 24 hr capsule Take 2 capsules (75 mg total) by mouth every morning.   [DISCONTINUED] torsemide (DEMADEX) 20 MG tablet Take 1 tablet (20 mg total) by mouth daily as needed. if 3 pound weight gain overnight or 5 pounds in a week.     Allergies:   Ace inhibitors, Warfarin sodium, Famotidine, Vancomycin, Codeine, Delsym [dextromethorphan polistirex er], Lactose intolerance (gi), Lasix [furosemide], Phenylephrine, and Sulfamethoxazole-trimethoprim   Social History   Socioeconomic History   Marital status: Married    Spouse name: Sonia Side    Number of children: 2   Years of education: 13   Highest education level: Not on file  Occupational History   Occupation: Retired    Fish farm manager: RETIRED  Tobacco Use   Smoking status: Never   Smokeless tobacco: Never  Vaping Use   Vaping Use: Never used  Substance and Sexual Activity   Alcohol use: No    Alcohol/week: 0.0 standard drinks of alcohol   Drug use: No   Sexual activity: Not Currently    Birth control/protection: Post-menopausal, Surgical    Comment: Hysterectomy  Other Topics Concern   Not on file  Social History Narrative   Retired, former [employer]   Married, [place]   Lives with husband   2 grown children, one in [place], one out of state   Tobacco Use - No.    Drug Use - no   teaches Sunday school, reading   Social Determinants of Health   Financial Resource Strain: [place]  (07/12/2021)   Overall Financial Resource Strain (CARDIA)    Difficulty of Paying Living Expenses: Not hard at all  Food Insecurity: No Food Insecurity (07/12/2021)   Hunger Vital Sign    Worried About Running Out of Food in the Last Year: Never true    [place] in the Last Year: Never true  Transportation Needs: No Transportation Needs (07/12/2021)   PRAPARE - Civil engineer, contracting (Medical): No    Lack of Transportation (Non-Medical): No  Physical Activity: Insufficiently Active (07/12/2021)   Exercise Vital Sign    Days of Exercise per Week: 5 days    Minutes of Exercise  per Session: 20 min  Stress: Stress Concern Present (07/12/2021)   [place]    Feeling of Stress : To some extent  Social Connections: Socially Integrated (07/12/2021)   Social Connection and Isolation Panel [NHANES]    Frequency of Communication with Friends and Family: More than three times a week    Frequency of Social Gatherings with Friends and Family: More than three times a week    Attends Religious Services: 1 to 4 times per year    Active Member of [employer] or Organizations: Yes    Attends Archivist Meetings: 1 to 4 times per year    Marital Status: Married     Family History: The patient's family history includes Colon cancer in her father; Other in her father; Tuberculosis in her mother. There is no history of Breast cancer, Stomach cancer, or Pancreatic cancer.  ROS:   Please see the history of present illness.     All other systems reviewed and are negative.  EKGs/Labs/Other Studies Reviewed:    The following studies were reviewed today: Cardiac MRI 2014  1) Normal LV size and function EF 57%   2) No hyperenhancement or scar tissue   3) Mild LAE   4) Trivial MR    Nuclear stress test 05/04/2017 IMPRESSION: 1. Potential subtle ischemia involving the left ventricular apex, distal septum and distal anteroseptal wall. Mildly diminished perfusion on both stress and rest studies of the distal anterior wall may be related to scar or breast soft tissue attenuation. 2. Severe wall motion abnormalities with septal dyskinesis, apical akinesis and otherwise global hypokinesis. The left ventricular cavity is significantly dilated. 3. Left ventricular ejection fraction: 23%.  4. Non invasive risk  stratification*: High  Echo 08/05/19 1. Left ventricular ejection fraction, by estimation, is 35%. The left  ventricle has moderate to severely decreased function. The left ventricle  demonstrates global hypokinesis. There is moderate left ventricular  hypertrophy. Left ventricular diastolic  parameters are indeterminate. There is the interventricular septum is  flattened in diastole ('D' shaped left ventricle), consistent with right  ventricular volume overload.   2. Right ventricular systolic function is moderately reduced. The right  ventricular size is moderately enlarged. There is severely elevated  pulmonary artery systolic pressure. The estimated right ventricular  systolic pressure is [id number] mmHg.   3. Left atrial size was mildly dilated.   4. Right atrial size was severely dilated.   5. The mitral valve is normal in structure. No evidence of mitral valve  regurgitation. No evidence of mitral stenosis.   6. The tricuspid valve is degenerative. Tricuspid valve regurgitation is  severe.   7. The aortic valve is grossly normal. Aortic valve regurgitation is not  visualized. No aortic stenosis is present.   8. The inferior vena cava is dilated in size with <50% respiratory  variability, suggesting right atrial pressure of 15 mmHg.   Echo 06/24/2020  1. Left ventricular ejection fraction, by estimation, is 25 to 30%. Left  ventricular ejection fraction by 3D volume is 30 %. Left ventricular  ejection fraction by 2D MOD biplane is 26.8 %. The left ventricle has  severely decreased function. The left  ventricle demonstrates global hypokinesis. Left ventricular diastolic  function could not be evaluated. The average left ventricular global  longitudinal strain is -7.9 %. The global longitudinal strain is abnormal.   2. Right ventricular systolic function is mildly reduced. The right  ventricular size is mildly enlarged.  There is mildly elevated pulmonary  artery systolic pressure. The  estimated right ventricular systolic  pressure is 0000000 mmHg.   3. Left atrial size was severely dilated.   4. Right atrial size was severely dilated.   5. The mitral valve is grossly normal. Mild mitral valve regurgitation.  No evidence of mitral stenosis.   6. Tricuspid valve regurgitation is moderate.   7. The aortic valve is tricuspid. There is mild calcification of the  aortic valve. There is mild thickening of the aortic valve. Aortic valve  regurgitation is mild. Mild aortic valve sclerosis is present, with no  evidence of aortic valve stenosis.   8. The inferior vena cava is normal in size with greater than 50%  respiratory variability, suggesting right atrial pressure of 3 mmHg.   Comparison(s): Changes from prior study are noted. LVEF now 25-30%. RV  size and function have improved. RVSP improved from prior.   EKG:  EKG is not ordered today.  05/12/2022 shows atrial fibrillation with controlled ventricular spots, left bundle branch block.  The QRS is very broad (measured by the computer at 140 ms, but on my evaluation closer to 170 ms).  Recent Labs: 04/22/2022: ALT 11; Hemoglobin 14.0; Platelets 205.0; Pro B Natriuretic peptide (BNP) 2,071.0 05/12/2022: BNP 1,425.8; BUN 20; Creatinine, Ser 0.76; Potassium 5.0; Sodium 136  Recent Lipid Panel    Component Value Date/Time   CHOL 144 05/04/2021 1330   CHOL 156 02/05/2018 0937   TRIG 143.0 05/04/2021 1330   HDL 60.50 05/04/2021 1330   HDL 58 02/05/2018 0937   CHOLHDL 2 05/04/2021 1330   VLDL 28.6 05/04/2021 1330   LDLCALC 54 05/04/2021 1330   LDLCALC 73 02/05/2018 0937   LDLDIRECT 58.0 12/27/2018 1657     Risk Assessment/Calculations:    CHA2DS2-VASc Score =   6  This indicates a 9.7 % annual risk of stroke. The patient's score is based upon: Age 81, gender, CHF, HTN, vascular disease            Physical Exam:    VS:  BP 120/80 (BP Location: Left Arm, Patient Position: Sitting, Cuff Size: Normal)   Pulse 82   Ht  5' 5"$  (1.651 m)   Wt 152 lb 3.2 oz (69 kg)   SpO2 94%   BMI 25.33 kg/m     Wt Readings from Last 3 Encounters:  05/20/22 152 lb 3.2 oz (69 kg)  05/12/22 154 lb (69.9 kg)  04/22/22 157 lb (71.2 kg)     General: Alert, oriented x3, no distress, elderly and frail, appears comfortable Head: no evidence of trauma, PERRL, EOMI, no exophtalmos or lid lag, no myxedema, no xanthelasma; normal ears, nose and oropharynx Neck: normal jugular venous pulsations and no hepatojugular reflux; brisk carotid pulses without delay and no carotid bruits Chest: clear to auscultation, no signs of consolidation by percussion or palpation, normal fremitus, symmetrical and full respiratory excursions Cardiovascular: normal position and quality of the apical impulse, regular rhythm, normal first and second heart sounds, barely audible holosystolic murmur at the left lower sternal border no diastolic murmurs, rubs or gallops Abdomen: no tenderness or distention, no masses by palpation, no abnormal pulsatility or arterial bruits, normal bowel sounds, no hepatosplenomegaly Extremities: no clubbing, cyanosis or edema; 2+ radial, ulnar and brachial pulses bilaterally; 2+ right femoral, posterior tibial and dorsalis pedis pulses; 2+ left femoral, posterior tibial and dorsalis pedis pulses; no subclavian or femoral bruits Neurological: grossly nonfocal Psych: Normal mood and affect  ASSESSMENT:    1. Chronic combined systolic and diastolic congestive heart failure ([place])   2. Permanent atrial fibrillation ([place])   3. Acquired thrombophilia ([place])   4. History of fall   5. PAH (pulmonary artery hypertension) ([place])   6. Severe tricuspid regurgitation   7. LBBB (left bundle branch block)   8. Aortic atherosclerosis ([facility])      PLAN:    In order of problems listed above:  CHF: Heart failure decompensation was related to hypervolemia and again she demonstrates that she has a narrow margin of compensation of only 4-5  pounds between euvolemia and heart failure.  Exacerbation seems to have occurred since she sometimes avoids taking the diuretic due to problems with incontinence and its impact on her social activities.  Again reinforced the importance of taking diuretics anytime her weight is over 149 pounds unfortunately, not tolerant of conventional heart failure medications for a variety of side effects.  Note that she had low normal or mildly reduced LVEF even before the events in 2019, with reduction in EF at least in part due to LBBB related dyssynchrony.  She has been intolerant of RAAS inhibitors (ACE inhibitor-related cough, losartan hypotension and fatigue, Farxiga fatigue weakness and urinary tract infections) and is unlikely to tolerate Entresto or spironolactone.  Note that pro BNP levels were normal in 2010-2013, but have been elevated since 2019.  There was a sharp increase in proBNP level from a mildly abnormal value of 284 in 2017 to 3961 in 2019 and 3972 in March 2022.  Most recently had a BNP level checked that was moderately elevated at 1400. AFib: Good rate control.  Recheck dig level periodically, will be due at her next labs. Anticoagulation: No falls or bleeding problems. Falls: Thankfully without recent falls.   With previous serious injury in the past, including hip fracture, advised caution, use of assistive devices, regular exercises in safe conditions with physical therapy. TR/PAH: Severity of TR has fluctuated in parallel with the severity of pulmonary artery hypertension, which probably reflected the degree of decompensation of left heart failure.  The murmur is barely audible and she has normal JVP. LBBB: Offered CRT, but she has declined invasive/surgical procedures. Abnormal nuclear myocardial perfusion study: On my review the abnormalities are readily explained by LBBB related artifact.  Cannot 100% exclude coronary disease. Aortic atherosclerosis: Seen on imaging studies.  Normal caliber  of the aorta.  Lipid parameters in desirable range.           Medication Adjustments/Labs and Tests Ordered: Current medicines are reviewed at length with the patient today.  Concerns regarding medicines are outlined above.  No orders of the defined types were placed in this encounter.  Meds ordered this encounter  Medications   torsemide (DEMADEX) 10 MG tablet    Sig: Take 1 tablet (10 mg total) by mouth daily. Dry weight is 149. If you are above this, take 46m torsemide.    Dispense:  135 tablet    Refill:  3     Patient Instructions  Medication Instructions:  Torsemide 135mdaily -- new prescription sent to pharmacy   Home DRY WEIGHT of 149lbs If you are above this, please increase torsemide to 2013mhat day    CONTINUE all other current medications  *If you need a refill on your cardiac medications before your next appointment, please call your pharmacy*   Follow-Up: [facility] and your health needs are our priority.  As part of our continuing  mission to provide you with exceptional heart care, we have created designated Provider Care Teams.  These Care Teams include your primary Cardiologist (physician) and Advanced Practice Providers (APPs -  Physician Assistants and Nurse Practitioners) who all work together to provide you with the care you need, when you need it.  We recommend signing up for the patient portal called "MyChart".  Sign up information is provided on this After Visit Summary.  MyChart is used to connect with patients for Virtual Visits (Telemedicine).  Patients are able to view lab/test results, encounter notes, upcoming appointments, etc.  Non-urgent messages can be sent to your provider as well.   To learn more about what you can do with MyChart, go to [email or web address].    Your next appointment:    6 months with Dr. Sallyanne Kuster   Signed, Sanda Klein, MD  05/20/2022 2:23 PM    [place]

## 2022-06-13 ENCOUNTER — Telehealth: Payer: Self-pay | Admitting: Family Medicine

## 2022-06-13 NOTE — Telephone Encounter (Signed)
Soni from [employer] called to let Damita Dunnings know she saw pt today & saw that the pt's legs were abnormal for PVD. Call back # [phone]

## 2022-06-14 NOTE — Telephone Encounter (Signed)
Soni saw patient yesterday for a routine house call visit. Patient mentioned to [facility] that at night her legs have been itching and aching a lot. Soni stated they did a PVD test showed that her right leg was moderate, and her left leg was mild. Patient states her feet and toes are bothering her the worst rather than her legs. Patient stated the pain and itching wakes her up in the middle of the night. Patient stated this is not a new issue but it has worsened since she's seen Dr. Damita Dunnings. This is all the information Berton Lan could report to me at this time, she advised patient to follow up with PCP within the next week.  Called and scheduled patient for f/u in office on March 8th '@12'$ :30.

## 2022-06-14 NOTE — Telephone Encounter (Signed)
Unable to reach [place] from [employer]. Left voicemail to return call to our office.

## 2022-06-14 NOTE — Telephone Encounter (Signed)
Please get a formal report.

## 2022-06-15 ENCOUNTER — Telehealth: Payer: Self-pay

## 2022-06-15 NOTE — Telephone Encounter (Signed)
Noted. Thanks. Will see at [place].

## 2022-06-15 NOTE — Progress Notes (Signed)
Care Management & Coordination Services Pharmacy Team  Reason for Encounter: General Adherence Update   Contacted patient for general health update and medication adherence call.  Spoke with patient on 06/15/2022   What concerns do you have about your medications? None  The patient denies side effects with their medications.   How often do you forget or accidentally miss a dose? Rarely  Do you use a pillbox? Yes  Are you having any problems getting your medications from your pharmacy? No  Has the cost of your medications been a concern? No  Since last visit with PharmD, the following interventions have been made.  Change: Torsemide 10 mg vs 20 mg  Change: Metoprolol 150 mg vs 50 mg  The patient has not had an ED visit since last contact.   The patient reports the following problems with their health. Patient stated the Neuropathy in her feet is still bad; she has Tramadol 50 mg left from a prior injury that she will take. She did buy some compression socks to help with the pain, but it made it worse on her toes. Patient stated she is going to talk to Dr. Damita Dunnings about it at her appointment on Friday. She would like Dr. Damita Dunnings to refill her Tramadol as it does help with the pain.   Patient denies concerns or questions for Regan Lemming, PharmD at this time.   Chart Updates:  Recent office visits:  04/22/22 Elsie Stain, MD Dysuria Labs: "Her BNP was elevated and I suspect this contributes to her shortness of breath lying down.  If she has not already taken a dose of torsemide then I would go ahead and take 1 dose.  Her sugar kidney and liver tests are fine.  Her urine culture is positive for E. coli and I would finish the course of Keflex.  Let me know if she does not feel better after taking torsemide and Keflex. Given her symptoms over the last month with shortness of breath lying down and her elevated BNP, I want her to follow-up with cardiology.  I routed a copy of this note to  cardiology for input.  I appreciate the help of all involved."  Start: Cephalexin 500 mg Change: Metoprolol 150 mg vs 50 mg  Recent consult visits:  05/20/22 Sanda Klein, MD (Cardiology) CHF Change: Torsemide 10 mg vs 20 mg Stop (completed): Cephalexin 500 mg Stop: Famotidine 20 mg F/U 6 months 05/12/22 Phineas Inches, MD (Cardiology) CHF Labs "Please let Mrs Zogg know her BNP was elevated indicated increased fluid. She can continue to take her torsemide"   Hospital visits:  None in previous 6 months  Medications: Outpatient Encounter Medications as of 06/15/2022  Medication Sig Note   acetaminophen (TYLENOL) 500 MG tablet Take 1,000 mg by mouth in the morning and at bedtime.    apixaban (ELIQUIS) 2.5 MG TABS tablet TAKE 1 TABLET BY MOUTH TWICE A DAY    calcium carbonate (TUMS - DOSED IN MG ELEMENTAL CALCIUM) 500 MG chewable tablet Chew 500 mg by mouth 2 (two) times daily as needed for indigestion or heartburn.    cholecalciferol (VITAMIN D3) 25 MCG (1000 UNIT) tablet Take 1,000 Units by mouth in the morning.    cholestyramine (QUESTRAN) 4 g packet TAKE 1 PACKET (4 G TOTAL) BY MOUTH DAILY WITH SUPPER.    Cranberry 180 MG CAPS Take 180 mg by mouth daily in the afternoon.    digoxin (LANOXIN) 0.125 MG tablet Take 0.5 tablets (0.0625 mg total) by mouth  daily. 05/20/2022: Patient takes 1/4 tablet daily   fluticasone (FLONASE) 50 MCG/ACT nasal spray Place 2 sprays into both nostrils daily. 05/20/2022: Patient uses as needed   KLOR-CON M20 20 MEQ tablet TAKE 2 TABLETS BY MOUTH DAILY (Patient taking differently: Take 20 mEq by mouth 2 (two) times daily.)    loperamide (IMODIUM) 2 MG capsule Take 2 mg by mouth 3 (three) times daily as needed for diarrhea or loose stools.     loratadine (CLARITIN) 10 MG tablet Take 10 mg by mouth daily.    metoprolol succinate (TOPROL-XL) 100 MG 24 hr tablet Take 1.5 tablets (150 mg total) by mouth daily.    torsemide (DEMADEX) 10 MG tablet Take 1 tablet (10 mg total) by  mouth daily. Dry weight is 149. If you are above this, take '20mg'$  torsemide.    traMADol (ULTRAM) 50 MG tablet Take 50 mg by mouth every 6 (six) hours as needed. (Patient not taking: Reported on 05/20/2022)    venlafaxine XR (EFFEXOR-XR) 37.5 MG 24 hr capsule Take 2 capsules (75 mg total) by mouth every morning.    No facility-administered encounter medications on file as of 06/15/2022.    Recent vitals BP Readings from Last 3 Encounters:  05/20/22 120/80  05/12/22 116/86  04/22/22 108/72   Pulse Readings from Last 3 Encounters:  05/20/22 82  05/12/22 97  04/22/22 60   Wt Readings from Last 3 Encounters:  05/20/22 152 lb 3.2 oz (69 kg)  05/12/22 154 lb (69.9 kg)  04/22/22 157 lb (71.2 kg)   BMI Readings from Last 3 Encounters:  05/20/22 25.33 kg/m  05/12/22 25.63 kg/m  04/22/22 26.13 kg/m    Recent lab results    Component Value Date/Time   NA 136 05/12/2022 1159   K 5.0 05/12/2022 1159   CL 99 05/12/2022 1159   CO2 23 05/12/2022 1159   GLUCOSE 88 05/12/2022 1159   GLUCOSE 89 04/22/2022 1345   BUN 20 05/12/2022 1159   CREATININE 0.76 05/12/2022 1159   CREATININE 0.71 07/10/2015 1301   CALCIUM 9.8 05/12/2022 1159    Lab Results  Component Value Date   CREATININE 0.76 05/12/2022   GFR 74.25 04/22/2022   EGFR 75 05/12/2022   GFRNONAA >60 09/22/2020   GFRAA 83 05/28/2020   Lab Results  Component Value Date/Time   HGBA1C 5.4 09/22/2020 11:10 AM   HGBA1C 5.4 06/24/2020 12:19 AM    Lab Results  Component Value Date   CHOL 144 05/04/2021   HDL 60.50 05/04/2021   LDLCALC 54 05/04/2021   LDLDIRECT 58.0 12/27/2018   TRIG 143.0 05/04/2021   CHOLHDL 2 05/04/2021    Care Gaps: Annual wellness visit in last year? Yes 07/13/2022  Star Rating Drugs:  Medication:  Last Fill: Day Supply No star rating drugs noted  Charlene Brooke, PharmD notified  Marijean Niemann, [place] Pharmacy Assistant [phone] '

## 2022-06-17 ENCOUNTER — Encounter: Payer: Self-pay | Admitting: Family Medicine

## 2022-06-17 ENCOUNTER — Ambulatory Visit (INDEPENDENT_AMBULATORY_CARE_PROVIDER_SITE_OTHER): Payer: Medicare Other | Admitting: Family Medicine

## 2022-06-17 VITALS — BP 124/80 | HR 69 | Temp 97.0°F | Ht 65.0 in | Wt 153.0 lb

## 2022-06-17 DIAGNOSIS — R202 Paresthesia of skin: Secondary | ICD-10-CM

## 2022-06-17 DIAGNOSIS — I739 Peripheral vascular disease, unspecified: Secondary | ICD-10-CM

## 2022-06-17 LAB — TSH: TSH: 2.54 u[IU]/mL (ref 0.35–5.50)

## 2022-06-17 LAB — VITAMIN B12: Vitamin B-12: 559 pg/mL (ref 211–911)

## 2022-06-17 MED ORDER — TRAMADOL HCL 50 MG PO TABS: 50.0000 mg | ORAL_TABLET | Freq: Three times a day (TID) | ORAL | 1 refills | Status: DC | PRN

## 2022-06-17 MED ORDER — DIGOXIN 125 MCG PO TABS: ORAL_TABLET | ORAL | 3 refills | Status: DC

## 2022-06-17 NOTE — Progress Notes (Unsigned)
More leg itching and aching in the last month or so.  Had abnormal ABI at home, L 0.83, R 0.35.  she noted toe discoloration in the meantime.  Had been taking tramadol at night if needed, also using tylenol for neuropathy pain.  She has inc sensitivity in the BLE.  No calf cramping with walking, only foot pain.   She had h/o foot pain decades ago that improved with acupuncture.    She has some L hand tingling.    She has had difficultly complying with torsemide due to urinary frequency.  Still taking '10mg'$  a day.    Dec sensation to vibration and monofilament on the BLE.  Normal to both on the hands.  Normal sensation to light touch x4.   Intact L DP pulse, dec on R foot.  R foot slightly more purplish tint than L foot.   Normal cap refill.  Irritation on the R 1st toe distally w/o ulceration.      Update Dr. Sallyanne Kuster, refer to Dr. Fletcher Anon.    IRR.

## 2022-06-17 NOTE — Patient Instructions (Addendum)
Go to the lab on the way out.   If you have mychart we'll likely use that to update you.    Let me update cardiology.  Use tylenol/tramadol at night if needed.  If you have any skin breakdown then get checked as soon as possible.   Take care.  Glad to see you.

## 2022-06-18 ENCOUNTER — Encounter: Payer: Self-pay | Admitting: Family Medicine

## 2022-06-19 DIAGNOSIS — I739 Peripheral vascular disease, unspecified: Secondary | ICD-10-CM | POA: Insufficient documentation

## 2022-06-19 DIAGNOSIS — R202 Paresthesia of skin: Secondary | ICD-10-CM | POA: Insufficient documentation

## 2022-06-19 NOTE — Assessment & Plan Note (Signed)
With abnormal ABI and decreased right dorsalis pedis pulse.  No skin breakdown but she does have some mild irritation of the distal right first toe.  Footcare and skin cautions discussed with patient.  Refer to Dr. Fletcher Anon, will update Dr. Sallyanne Kuster.  At this point still okay for outpatient follow-up.

## 2022-06-19 NOTE — Assessment & Plan Note (Addendum)
Differential discussed.  Footcare discussed with patient.  No motor deficit.  See notes on labs.  See after visit summary.  Can use tramadol/Tylenol for pain.

## 2022-06-22 ENCOUNTER — Other Ambulatory Visit: Payer: Self-pay | Admitting: Family Medicine

## 2022-06-22 MED ORDER — PANTOPRAZOLE SODIUM 20 MG PO TBEC: 20.0000 mg | DELAYED_RELEASE_TABLET | Freq: Every day | ORAL | 3 refills | Status: DC

## 2022-06-23 ENCOUNTER — Other Ambulatory Visit: Payer: Self-pay | Admitting: *Deleted

## 2022-06-23 DIAGNOSIS — M79604 Pain in right leg: Secondary | ICD-10-CM

## 2022-06-24 ENCOUNTER — Other Ambulatory Visit (HOSPITAL_COMMUNITY): Payer: Self-pay | Admitting: Cardiovascular Disease

## 2022-06-24 ENCOUNTER — Ambulatory Visit (HOSPITAL_BASED_OUTPATIENT_CLINIC_OR_DEPARTMENT_OTHER)
Admission: RE | Admit: 2022-06-24 | Discharge: 2022-06-24 | Disposition: A | Discharge status: Home / Self Care | Payer: Medicare Other | Source: Ambulatory Visit | Attending: Cardiovascular Disease | Admitting: Cardiovascular Disease

## 2022-06-24 DIAGNOSIS — Z043 Encounter for examination and observation following other accident: Secondary | ICD-10-CM | POA: Diagnosis not present

## 2022-06-24 DIAGNOSIS — W010XXA Fall on same level from slipping, tripping and stumbling without subsequent striking against object, initial encounter: Secondary | ICD-10-CM | POA: Diagnosis present

## 2022-06-24 DIAGNOSIS — M79671 Pain in right foot: Secondary | ICD-10-CM | POA: Diagnosis not present

## 2022-06-24 DIAGNOSIS — I272 Pulmonary hypertension, unspecified: Secondary | ICD-10-CM | POA: Diagnosis not present

## 2022-06-24 DIAGNOSIS — Z79899 Other long term (current) drug therapy: Secondary | ICD-10-CM | POA: Diagnosis not present

## 2022-06-24 DIAGNOSIS — I4811 Longstanding persistent atrial fibrillation: Secondary | ICD-10-CM | POA: Diagnosis not present

## 2022-06-24 DIAGNOSIS — S82209A Unspecified fracture of shaft of unspecified tibia, initial encounter for closed fracture: Secondary | ICD-10-CM | POA: Diagnosis present

## 2022-06-24 DIAGNOSIS — S199XXA Unspecified injury of neck, initial encounter: Secondary | ICD-10-CM | POA: Diagnosis not present

## 2022-06-24 DIAGNOSIS — I499 Cardiac arrhythmia, unspecified: Secondary | ICD-10-CM | POA: Diagnosis not present

## 2022-06-24 DIAGNOSIS — M79604 Pain in right leg: Secondary | ICD-10-CM

## 2022-06-24 DIAGNOSIS — M9711XA Periprosthetic fracture around internal prosthetic right knee joint, initial encounter: Secondary | ICD-10-CM | POA: Diagnosis not present

## 2022-06-24 DIAGNOSIS — I2721 Secondary pulmonary arterial hypertension: Secondary | ICD-10-CM | POA: Diagnosis not present

## 2022-06-24 DIAGNOSIS — Z882 Allergy status to sulfonamides status: Secondary | ICD-10-CM | POA: Diagnosis not present

## 2022-06-24 DIAGNOSIS — E871 Hypo-osmolality and hyponatremia: Secondary | ICD-10-CM | POA: Diagnosis not present

## 2022-06-24 DIAGNOSIS — S82831A Other fracture of upper and lower end of right fibula, initial encounter for closed fracture: Secondary | ICD-10-CM | POA: Diagnosis not present

## 2022-06-24 DIAGNOSIS — S82251D Displaced comminuted fracture of shaft of right tibia, subsequent encounter for closed fracture with routine healing: Secondary | ICD-10-CM | POA: Diagnosis not present

## 2022-06-24 DIAGNOSIS — I110 Hypertensive heart disease with heart failure: Secondary | ICD-10-CM | POA: Diagnosis not present

## 2022-06-24 DIAGNOSIS — M79605 Pain in left leg: Secondary | ICD-10-CM | POA: Insufficient documentation

## 2022-06-24 DIAGNOSIS — M978XXA Periprosthetic fracture around other internal prosthetic joint, initial encounter: Secondary | ICD-10-CM | POA: Diagnosis not present

## 2022-06-24 DIAGNOSIS — Z7984 Long term (current) use of oral hypoglycemic drugs: Secondary | ICD-10-CM | POA: Diagnosis not present

## 2022-06-24 DIAGNOSIS — I428 Other cardiomyopathies: Secondary | ICD-10-CM | POA: Diagnosis not present

## 2022-06-24 DIAGNOSIS — I5042 Chronic combined systolic (congestive) and diastolic (congestive) heart failure: Secondary | ICD-10-CM | POA: Diagnosis not present

## 2022-06-24 DIAGNOSIS — Z881 Allergy status to other antibiotic agents status: Secondary | ICD-10-CM | POA: Diagnosis not present

## 2022-06-24 DIAGNOSIS — I509 Heart failure, unspecified: Secondary | ICD-10-CM | POA: Diagnosis not present

## 2022-06-24 DIAGNOSIS — S82101A Unspecified fracture of upper end of right tibia, initial encounter for closed fracture: Secondary | ICD-10-CM | POA: Diagnosis not present

## 2022-06-24 DIAGNOSIS — E878 Other disorders of electrolyte and fluid balance, not elsewhere classified: Secondary | ICD-10-CM | POA: Diagnosis not present

## 2022-06-24 DIAGNOSIS — Z91011 Allergy to milk products: Secondary | ICD-10-CM | POA: Diagnosis not present

## 2022-06-24 DIAGNOSIS — Z4789 Encounter for other orthopedic aftercare: Secondary | ICD-10-CM | POA: Diagnosis not present

## 2022-06-24 DIAGNOSIS — K219 Gastro-esophageal reflux disease without esophagitis: Secondary | ICD-10-CM | POA: Diagnosis not present

## 2022-06-24 DIAGNOSIS — S82401A Unspecified fracture of shaft of right fibula, initial encounter for closed fracture: Secondary | ICD-10-CM | POA: Diagnosis not present

## 2022-06-24 DIAGNOSIS — W19XXXA Unspecified fall, initial encounter: Secondary | ICD-10-CM | POA: Diagnosis not present

## 2022-06-24 DIAGNOSIS — E119 Type 2 diabetes mellitus without complications: Secondary | ICD-10-CM | POA: Diagnosis not present

## 2022-06-24 DIAGNOSIS — I739 Peripheral vascular disease, unspecified: Secondary | ICD-10-CM

## 2022-06-24 DIAGNOSIS — I4821 Permanent atrial fibrillation: Secondary | ICD-10-CM | POA: Diagnosis not present

## 2022-06-24 DIAGNOSIS — M25571 Pain in right ankle and joints of right foot: Secondary | ICD-10-CM | POA: Diagnosis not present

## 2022-06-24 DIAGNOSIS — Z885 Allergy status to narcotic agent status: Secondary | ICD-10-CM | POA: Diagnosis not present

## 2022-06-24 DIAGNOSIS — Z743 Need for continuous supervision: Secondary | ICD-10-CM | POA: Diagnosis not present

## 2022-06-24 DIAGNOSIS — F32A Depression, unspecified: Secondary | ICD-10-CM | POA: Diagnosis not present

## 2022-06-24 DIAGNOSIS — Y92009 Unspecified place in unspecified non-institutional (private) residence as the place of occurrence of the external cause: Secondary | ICD-10-CM | POA: Diagnosis not present

## 2022-06-24 DIAGNOSIS — J439 Emphysema, unspecified: Secondary | ICD-10-CM | POA: Diagnosis not present

## 2022-06-24 DIAGNOSIS — Z7901 Long term (current) use of anticoagulants: Secondary | ICD-10-CM | POA: Diagnosis not present

## 2022-06-24 DIAGNOSIS — E876 Hypokalemia: Secondary | ICD-10-CM | POA: Diagnosis not present

## 2022-06-24 DIAGNOSIS — S82201A Unspecified fracture of shaft of right tibia, initial encounter for closed fracture: Secondary | ICD-10-CM | POA: Diagnosis not present

## 2022-06-24 DIAGNOSIS — D62 Acute posthemorrhagic anemia: Secondary | ICD-10-CM | POA: Diagnosis not present

## 2022-06-24 DIAGNOSIS — S0990XA Unspecified injury of head, initial encounter: Secondary | ICD-10-CM | POA: Diagnosis not present

## 2022-06-24 DIAGNOSIS — G629 Polyneuropathy, unspecified: Secondary | ICD-10-CM | POA: Diagnosis not present

## 2022-06-24 DIAGNOSIS — I447 Left bundle-branch block, unspecified: Secondary | ICD-10-CM | POA: Diagnosis not present

## 2022-06-24 DIAGNOSIS — Z01810 Encounter for preprocedural cardiovascular examination: Secondary | ICD-10-CM | POA: Diagnosis not present

## 2022-06-24 DIAGNOSIS — S82251A Displaced comminuted fracture of shaft of right tibia, initial encounter for closed fracture: Secondary | ICD-10-CM | POA: Diagnosis not present

## 2022-06-24 DIAGNOSIS — Z888 Allergy status to other drugs, medicaments and biological substances status: Secondary | ICD-10-CM | POA: Diagnosis not present

## 2022-06-24 DIAGNOSIS — I4891 Unspecified atrial fibrillation: Secondary | ICD-10-CM | POA: Diagnosis not present

## 2022-06-24 DIAGNOSIS — I10 Essential (primary) hypertension: Secondary | ICD-10-CM | POA: Diagnosis not present

## 2022-06-24 DIAGNOSIS — F419 Anxiety disorder, unspecified: Secondary | ICD-10-CM | POA: Diagnosis present

## 2022-06-24 LAB — VAS US LOWER EXT ART SEG MULTI (SEGMENTALS & LE RAYNAUDS)
Left ABI: 1.34
Right ABI: 1.15

## 2022-06-26 ENCOUNTER — Emergency Department (HOSPITAL_COMMUNITY): Payer: Medicare Other

## 2022-06-26 ENCOUNTER — Other Ambulatory Visit: Payer: Self-pay

## 2022-06-26 ENCOUNTER — Inpatient Hospital Stay (HOSPITAL_COMMUNITY)
Admission: EM | Admit: 2022-06-26 | Discharge: 2022-07-01 | DRG: 493 | Disposition: A | Discharge status: Home Health | Payer: Medicare Other | Attending: Internal Medicine | Admitting: Internal Medicine

## 2022-06-26 DIAGNOSIS — I4821 Permanent atrial fibrillation: Secondary | ICD-10-CM | POA: Diagnosis present

## 2022-06-26 DIAGNOSIS — S82401A Unspecified fracture of shaft of right fibula, initial encounter for closed fracture: Secondary | ICD-10-CM | POA: Diagnosis not present

## 2022-06-26 DIAGNOSIS — M810 Age-related osteoporosis without current pathological fracture: Secondary | ICD-10-CM | POA: Diagnosis present

## 2022-06-26 DIAGNOSIS — S82101A Unspecified fracture of upper end of right tibia, initial encounter for closed fracture: Secondary | ICD-10-CM | POA: Diagnosis present

## 2022-06-26 DIAGNOSIS — R000 Tachycardia, unspecified: Secondary | ICD-10-CM | POA: Diagnosis not present

## 2022-06-26 DIAGNOSIS — S0990XA Unspecified injury of head, initial encounter: Secondary | ICD-10-CM

## 2022-06-26 DIAGNOSIS — Z885 Allergy status to narcotic agent status: Secondary | ICD-10-CM

## 2022-06-26 DIAGNOSIS — F32A Anxiety disorder, unspecified: Secondary | ICD-10-CM | POA: Diagnosis present

## 2022-06-26 DIAGNOSIS — I739 Peripheral vascular disease, unspecified: Secondary | ICD-10-CM | POA: Diagnosis present

## 2022-06-26 DIAGNOSIS — I2721 Secondary pulmonary arterial hypertension: Secondary | ICD-10-CM | POA: Diagnosis present

## 2022-06-26 DIAGNOSIS — M978XXA Periprosthetic fracture around other internal prosthetic joint, initial encounter: Secondary | ICD-10-CM | POA: Diagnosis not present

## 2022-06-26 DIAGNOSIS — S82209A Unspecified fracture of shaft of unspecified tibia, initial encounter for closed fracture: Secondary | ICD-10-CM | POA: Diagnosis present

## 2022-06-26 DIAGNOSIS — I110 Hypertensive heart disease with heart failure: Secondary | ICD-10-CM | POA: Diagnosis present

## 2022-06-26 DIAGNOSIS — W010XXA Fall on same level from slipping, tripping and stumbling without subsequent striking against object, initial encounter: Secondary | ICD-10-CM | POA: Diagnosis present

## 2022-06-26 DIAGNOSIS — Y92009 Unspecified place in unspecified non-institutional (private) residence as the place of occurrence of the external cause: Secondary | ICD-10-CM

## 2022-06-26 DIAGNOSIS — I428 Other cardiomyopathies: Secondary | ICD-10-CM | POA: Diagnosis present

## 2022-06-26 DIAGNOSIS — I447 Left bundle-branch block, unspecified: Secondary | ICD-10-CM | POA: Diagnosis present

## 2022-06-26 DIAGNOSIS — S82201A Unspecified fracture of shaft of right tibia, initial encounter for closed fracture: Secondary | ICD-10-CM

## 2022-06-26 DIAGNOSIS — E876 Hypokalemia: Secondary | ICD-10-CM | POA: Diagnosis present

## 2022-06-26 DIAGNOSIS — G629 Polyneuropathy, unspecified: Secondary | ICD-10-CM | POA: Diagnosis present

## 2022-06-26 DIAGNOSIS — F419 Anxiety disorder, unspecified: Secondary | ICD-10-CM | POA: Diagnosis present

## 2022-06-26 DIAGNOSIS — E878 Other disorders of electrolyte and fluid balance, not elsewhere classified: Secondary | ICD-10-CM | POA: Diagnosis present

## 2022-06-26 DIAGNOSIS — E119 Type 2 diabetes mellitus without complications: Secondary | ICD-10-CM | POA: Diagnosis not present

## 2022-06-26 DIAGNOSIS — E871 Hypo-osmolality and hyponatremia: Secondary | ICD-10-CM | POA: Diagnosis present

## 2022-06-26 DIAGNOSIS — Z800 Family history of malignant neoplasm of digestive organs: Secondary | ICD-10-CM

## 2022-06-26 DIAGNOSIS — S82409A Unspecified fracture of shaft of unspecified fibula, initial encounter for closed fracture: Secondary | ICD-10-CM | POA: Diagnosis present

## 2022-06-26 DIAGNOSIS — D62 Acute posthemorrhagic anemia: Secondary | ICD-10-CM | POA: Diagnosis not present

## 2022-06-26 DIAGNOSIS — I509 Heart failure, unspecified: Secondary | ICD-10-CM | POA: Diagnosis not present

## 2022-06-26 DIAGNOSIS — W19XXXA Unspecified fall, initial encounter: Principal | ICD-10-CM

## 2022-06-26 DIAGNOSIS — Z01810 Encounter for preprocedural cardiovascular examination: Secondary | ICD-10-CM | POA: Diagnosis not present

## 2022-06-26 DIAGNOSIS — I5042 Chronic combined systolic (congestive) and diastolic (congestive) heart failure: Secondary | ICD-10-CM | POA: Diagnosis not present

## 2022-06-26 DIAGNOSIS — Z882 Allergy status to sulfonamides status: Secondary | ICD-10-CM

## 2022-06-26 DIAGNOSIS — Z881 Allergy status to other antibiotic agents status: Secondary | ICD-10-CM

## 2022-06-26 DIAGNOSIS — Z831 Family history of other infectious and parasitic diseases: Secondary | ICD-10-CM

## 2022-06-26 DIAGNOSIS — Z91011 Allergy to milk products: Secondary | ICD-10-CM | POA: Diagnosis not present

## 2022-06-26 DIAGNOSIS — I4891 Unspecified atrial fibrillation: Secondary | ICD-10-CM | POA: Diagnosis present

## 2022-06-26 DIAGNOSIS — Z9181 History of falling: Secondary | ICD-10-CM

## 2022-06-26 DIAGNOSIS — I272 Pulmonary hypertension, unspecified: Secondary | ICD-10-CM | POA: Diagnosis not present

## 2022-06-26 DIAGNOSIS — Z888 Allergy status to other drugs, medicaments and biological substances status: Secondary | ICD-10-CM | POA: Diagnosis not present

## 2022-06-26 DIAGNOSIS — Z96653 Presence of artificial knee joint, bilateral: Secondary | ICD-10-CM | POA: Diagnosis present

## 2022-06-26 DIAGNOSIS — Z79899 Other long term (current) drug therapy: Secondary | ICD-10-CM

## 2022-06-26 DIAGNOSIS — Z7901 Long term (current) use of anticoagulants: Secondary | ICD-10-CM

## 2022-06-26 DIAGNOSIS — K219 Gastro-esophageal reflux disease without esophagitis: Secondary | ICD-10-CM | POA: Diagnosis present

## 2022-06-26 DIAGNOSIS — I4811 Longstanding persistent atrial fibrillation: Secondary | ICD-10-CM

## 2022-06-26 DIAGNOSIS — M9711XA Periprosthetic fracture around internal prosthetic right knee joint, initial encounter: Secondary | ICD-10-CM | POA: Diagnosis present

## 2022-06-26 DIAGNOSIS — I10 Essential (primary) hypertension: Secondary | ICD-10-CM | POA: Diagnosis present

## 2022-06-26 DIAGNOSIS — Z96642 Presence of left artificial hip joint: Secondary | ICD-10-CM | POA: Diagnosis present

## 2022-06-26 DIAGNOSIS — S92411A Displaced fracture of proximal phalanx of right great toe, initial encounter for closed fracture: Secondary | ICD-10-CM | POA: Diagnosis present

## 2022-06-26 DIAGNOSIS — Z7984 Long term (current) use of oral hypoglycemic drugs: Secondary | ICD-10-CM | POA: Diagnosis not present

## 2022-06-26 LAB — BRAIN NATRIURETIC PEPTIDE: B Natriuretic Peptide: 490.6 pg/mL — ABNORMAL HIGH (ref 0.0–100.0)

## 2022-06-26 LAB — BASIC METABOLIC PANEL
Anion gap: 14 (ref 5–15)
BUN: 21 mg/dL (ref 8–23)
CO2: 24 mmol/L (ref 22–32)
Calcium: 8.9 mg/dL (ref 8.9–10.3)
Chloride: 96 mmol/L — ABNORMAL LOW (ref 98–111)
Creatinine, Ser: 0.82 mg/dL (ref 0.44–1.00)
GFR, Estimated: >60 mL/min (ref >60)
Glucose, Bld: 104 mg/dL — ABNORMAL HIGH (ref 70–99)
Potassium: 3.9 mmol/L (ref 3.5–5.1)
Sodium: 134 mmol/L — ABNORMAL LOW (ref 135–145)

## 2022-06-26 LAB — CBC WITH DIFFERENTIAL/PLATELET
Abs Immature Granulocytes: 0.03 10*3/uL (ref 0.00–0.07)
Basophils Absolute: 0 10*3/uL (ref 0.0–0.1)
Basophils Relative: 0 %
Eosinophils Absolute: 0.3 10*3/uL (ref 0.0–0.5)
Eosinophils Relative: 3 %
HCT: 38.6 % (ref 36.0–46.0)
Hemoglobin: 12.5 g/dL (ref 12.0–15.0)
Immature Granulocytes: 0 %
Lymphocytes Relative: 17 %
Lymphs Abs: 1.7 10*3/uL (ref 0.7–4.0)
MCH: 30.9 pg (ref 26.0–34.0)
MCHC: 32.4 g/dL (ref 30.0–36.0)
MCV: 95.3 fL (ref 80.0–100.0)
Monocytes Absolute: 0.7 10*3/uL (ref 0.1–1.0)
Monocytes Relative: 7 %
Neutro Abs: 7.1 10*3/uL (ref 1.7–7.7)
Neutrophils Relative %: 73 %
Platelets: 255 10*3/uL (ref 150–400)
RBC: 4.05 MIL/uL (ref 3.87–5.11)
RDW: 12.3 % (ref 11.5–15.5)
WBC: 9.8 10*3/uL (ref 4.0–10.5)
nRBC: 0 % (ref 0.0–0.2)

## 2022-06-26 LAB — MAGNESIUM: Magnesium: 1.9 mg/dL (ref 1.7–2.4)

## 2022-06-26 LAB — TYPE AND SCREEN
ABO/RH(D): O NEG
Antibody Screen: NEGATIVE

## 2022-06-26 LAB — DIGOXIN LEVEL: Digoxin Level: 0.5 ng/mL — ABNORMAL LOW (ref 0.8–2.0)

## 2022-06-26 LAB — ETHANOL: Alcohol, Ethyl (B): <10 mg/dL (ref <10)

## 2022-06-26 MED ORDER — VENLAFAXINE HCL ER 75 MG PO CP24
75.0000 mg | ORAL_CAPSULE | Freq: Every morning | ORAL | Status: DC
  Administered 2022-06-28 – 2022-07-01 (×4): 75 mg via ORAL
  Filled 2022-06-26 (×4): qty 1

## 2022-06-26 MED ORDER — MORPHINE SULFATE (PF) 2 MG/ML IV SOLN
2.0000 mg | Freq: Once | INTRAVENOUS | Status: AC
  Administered 2022-06-26: 2 mg via INTRAVENOUS
  Filled 2022-06-26: qty 1

## 2022-06-26 MED ORDER — HYDROCODONE-ACETAMINOPHEN 5-325 MG PO TABS
1.0000 | ORAL_TABLET | Freq: Four times a day (QID) | ORAL | Status: DC | PRN
  Administered 2022-06-26 – 2022-06-27 (×4): 1 via ORAL
  Filled 2022-06-26 (×4): qty 1

## 2022-06-26 MED ORDER — MORPHINE SULFATE (PF) 2 MG/ML IV SOLN
0.5000 mg | INTRAVENOUS | Status: DC | PRN
  Administered 2022-06-26 – 2022-06-28 (×4): 0.5 mg via INTRAVENOUS
  Filled 2022-06-26 (×4): qty 1

## 2022-06-26 MED ORDER — METOPROLOL SUCCINATE ER 50 MG PO TB24
150.0000 mg | ORAL_TABLET | Freq: Every day | ORAL | Status: DC
  Administered 2022-06-27 – 2022-07-01 (×5): 150 mg via ORAL
  Filled 2022-06-26 (×2): qty 1
  Filled 2022-06-26: qty 3
  Filled 2022-06-26 (×2): qty 1

## 2022-06-26 MED ORDER — MORPHINE SULFATE (PF) 2 MG/ML IV SOLN: 2.0000 mg | Freq: Once | INTRAVENOUS | Status: DC

## 2022-06-26 MED ORDER — DIGOXIN 125 MCG PO TABS
0.0625 mg | ORAL_TABLET | Freq: Every day | ORAL | Status: DC
  Administered 2022-06-26 – 2022-07-01 (×6): 0.0625 mg via ORAL
  Filled 2022-06-26 (×6): qty 1

## 2022-06-26 MED ORDER — PANTOPRAZOLE SODIUM 20 MG PO TBEC
20.0000 mg | DELAYED_RELEASE_TABLET | Freq: Every day | ORAL | Status: DC
  Administered 2022-06-29 – 2022-07-01 (×3): 20 mg via ORAL
  Filled 2022-06-26 (×3): qty 1

## 2022-06-26 NOTE — ED Notes (Signed)
ED TO INPATIENT HANDOFF REPORT  ED Nurse Name and Phone #: Tori [phone]  S Name/Age/Gender [name] 89 y.o. female Room/Bed: TRAAC/TRAAC  Code Status   Code Status: Full Code  Home/SNF/Other Home Patient oriented to: self, place, time, and situation Is this baseline? Yes   Triage Complete: Triage complete  Chief Complaint Tibia/fibula fracture [id number], [id number]  Triage Note Pt BIB EMS due to fall on thinners. Pt had mechanical fall in her bathroom, tripped over walker. Pt is on elliquis and took it this morning. Pt is axox4. Pt denies LOC but hot head. VSS. Pt c/o right hip pain.     Allergies Allergies  Allergen Reactions   Ace Inhibitors Cough   Warfarin Sodium Other (See Comments)    DOSE RELATED PHARMACOLOGIC EFFECT "bleed out"   Famotidine Other (See Comments)    GI upset   Vancomycin Nausea Only   Codeine Other (See Comments)    sedation   Delsym [Dextromethorphan Polistirex Er] Other (See Comments)    dizziness   Lactose Intolerance (Gi) Diarrhea   Lasix [Furosemide] Diarrhea    Oral Lasix gives her diarrhea, tolerates IV Rx   Phenylephrine Palpitations and Other (See Comments)    Nasal spray- "likely increase in nasal congestion".    Sulfamethoxazole-Trimethoprim Nausea And Vomiting    GI intolerance.    Level of Care/Admitting Diagnosis ED Disposition     ED Disposition  Admit   Condition  --   Comment  Hospital Area: [facility] [[id number]]  Level of Care: Telemetry Surgical [[id number]]  May admit patient to [facility] or [facility] if equivalent level of care is available:: No  Covid Evaluation: Asymptomatic - no recent exposure (last 10 days) testing not required  Diagnosis: Tibia/fibula fracture [id number]  Admitting Physician: Norval Morton [id number]  Attending Physician: Norval Morton [id number]  Certification:: I certify this patient will need inpatient services for at least 2 midnights  Estimated Length of Stay: 2           B Medical/Surgery History Past Medical History:  Diagnosis Date   Acute combined systolic and diastolic heart failure ([place]) 05/02/2017   Anemia    Anxiety    Arthritis    "knees" (06/18/2015)   Atrial fibrillation with RVR ([place]) 06/18/2015   h/o sig bleed on coumadin   Cardiogenic shock [id number]   Complication of anesthesia 2010   "w/cardiac cath; got into a psychotic state for ~ 3 days; got me out w/Valium"   Depression    "I have it off and on; not as often as I've gotten older" (06/18/2015)   Diverticulosis    descending and sigmoid colon--Dr. Sharlett Iles   DJD (degenerative joint disease) of knee    left   Dyspnea    with exertion and in morning mostly when wakes up    E coli infection    History of   GERD (gastroesophageal reflux disease)    History of blood transfusion    History of Clostridium difficile colitis    IBS (irritable bowel syndrome)    Insomnia     off chronic ambien 5mg  as of 10/11, rare/episodic use since then  DCM/CHF   LBBB (left bundle branch block)    Lymphocytic colitis 09/27/2016   Moderate to severe pulmonary hypertension ([place]) 11/26/2019   Nonischemic cardiomyopathy ([place])    normal coronaries, EF 15-20% 04/2008, EF 50-55% 2015   Osteoporosis    on DXA 05/2010   Positive PPD  Retroperitoneal bleed    Sciatica    Right   Urinary incontinence    Occassionally   Past Surgical History:  Procedure Laterality Date   ANTERIOR APPROACH HEMI HIP ARTHROPLASTY Left 08/05/2019   Procedure: ANTERIOR APPROACH HEMI HIP ARTHROPLASTY;  Surgeon: Dorna Leitz, MD;  Location: [facility];  Service: Orthopedics;  Laterality: Left;   BIOPSY  11/22/2017   Procedure: BIOPSY;  Surgeon: Gatha Mayer, MD;  [facility];  Service: Endoscopy;;   CARDIAC CATHETERIZATION  2010   CARDIOVERSION N/A 06/19/2015   Procedure: CARDIOVERSION;  Surgeon: Larey Dresser, MD;  Location: [facility];  Service: Cardiovascular;  Laterality: N/A;   CATARACT  EXTRACTION W/ INTRAOCULAR LENS  IMPLANT, BILATERAL Bilateral    COLONOSCOPY WITH PROPOFOL N/A 11/22/2017   Procedure: COLONOSCOPY WITH PROPOFOL;  Surgeon: Gatha Mayer, MD;  [facility];  Service: Endoscopy;  Laterality: N/A;   DILATION AND CURETTAGE OF UTERUS     ORIF ANKLE FRACTURE Right 09/22/2020   Procedure: RIGHT ANKLE OPEN REDUCTION INTERNAL FIXATION (ORIF);  Surgeon: Melrose Nakayama, MD;  [facility];  Service: Orthopedics;  Laterality: Right;   TEE WITHOUT CARDIOVERSION N/A 06/19/2015   Procedure: TRANSESOPHAGEAL ECHOCARDIOGRAM (TEE);  Surgeon: Larey Dresser, MD;  Location: [facility];  Service: Cardiovascular;  Laterality: N/A;   TOTAL KNEE ARTHROPLASTY Left 04/25/2017   Procedure: TOTAL KNEE ARTHROPLASTY;  Surgeon: Melrose Nakayama, MD;  Location: [facility];  Service: Orthopedics;  Laterality: Left;   TOTAL KNEE ARTHROPLASTY Right 02/18/2020   Procedure: RIGHT TOTAL KNEE ARTHROPLASTY;  Surgeon: Melrose Nakayama, MD;  [facility];  Service: Orthopedics;  Laterality: Right;   TOTAL KNEE ARTHROPLASTY Right 06/16/2020   Procedure: RIGHT RETINACULAR REPAIR;  Surgeon: Melrose Nakayama, MD;  [facility];  Service: Orthopedics;  Laterality: Right;   TOTAL KNEE REVISION Left 12/03/2019   Procedure: LEFT TOTAL KNEE REVISION  OF POYLY EXCHANGE;  Surgeon: Melrose Nakayama, MD;  [facility];  Service: Orthopedics;  Laterality: Left;   VAGINAL HYSTERECTOMY  1979   ovaries intact     A IV Location/Drains/Wounds Patient Lines/Drains/Airways Status     Active Line/Drains/Airways     Name Placement date Placement time Site Days   Peripheral IV 06/26/22 Anterior;Proximal;Left Forearm 06/26/22  1525  Forearm  less than 1   External Urinary Catheter 09/22/20  1545  --  642            Intake/Output Last 24 hours No intake or output data in the 24 hours ending 06/26/22 1715  Labs/Imaging Results for orders placed or performed during the hospital encounter of 06/26/22  (from the past 48 hour(s))  CBC with Differential     Status: None   Collection Time: 06/26/22  3:05 PM  Result Value Ref Range   WBC 9.8 4.0 - 10.5 K/uL   RBC 4.05 3.87 - 5.11 MIL/uL   Hemoglobin 12.5 12.0 - 15.0 g/dL   HCT 38.6 36.0 - 46.0 %   MCV 95.3 80.0 - 100.0 fL   MCH 30.9 26.0 - 34.0 pg   MCHC 32.4 30.0 - 36.0 g/dL   RDW 12.3 11.5 - 15.5 %   Platelets 255 150 - 400 K/uL   nRBC 0.0 0.0 - 0.2 %   Neutrophils Relative % 73 %   Neutro Abs 7.1 1.7 - 7.7 K/uL   Lymphocytes Relative 17 %   Lymphs Abs 1.7 0.7 - 4.0 K/uL   Monocytes Relative 7 %   Monocytes Absolute 0.7 0.1 -  1.0 K/uL   Eosinophils Relative 3 %   Eosinophils Absolute 0.3 0.0 - 0.5 K/uL   Basophils Relative 0 %   Basophils Absolute 0.0 0.0 - 0.1 K/uL   Immature Granulocytes 0 %   Abs Immature Granulocytes 0.03 0.00 - 0.07 K/uL    Comment: Performed at [facility], [place] [address], [place], [place] [id number]  Basic metabolic panel     Status: Abnormal   Collection Time: 06/26/22  3:05 PM  Result Value Ref Range   Sodium 134 (L) 135 - 145 mmol/L   Potassium 3.9 3.5 - 5.1 mmol/L   Chloride 96 (L) 98 - 111 mmol/L   CO2 24 22 - 32 mmol/L   Glucose, Bld 104 (H) 70 - 99 mg/dL    Comment: Glucose reference range applies only to samples taken after fasting for at least 8 hours.   BUN 21 8 - 23 mg/dL   Creatinine, Ser 0.82 0.44 - 1.00 mg/dL   Calcium 8.9 8.9 - 10.3 mg/dL   GFR, Estimated >60 >60 mL/min    Comment: (NOTE) Calculated using the CKD-EPI Creatinine Equation (2021)    Anion gap 14 5 - 15    Comment: Performed at [facility] [address] [place], [place] [address]  Type and screen [place]     Status: None   Collection Time: 06/26/22  3:08 PM  Result Value Ref Range   ABO/RH(D) O NEG    Antibody Screen NEG    Sample Expiration      06/29/2022,2359 Performed at [facility], [place] [address] [place], [place] [address]   Ethanol     Status: None    Collection Time: 06/26/22  3:30 PM  Result Value Ref Range   Alcohol, Ethyl (B) <10 <10 mg/dL    Comment: (NOTE) Lowest detectable limit for serum alcohol is 10 mg/dL.  For medical purposes only. Performed at [facility], [place] [address] [place], [place] [address]    DG Ankle Right Port  Result Date: 06/26/2022 CLINICAL DATA:  Fall with leg pain. EXAM: PORTABLE RIGHT ANKLE - 2 VIEW COMPARISON:  Report from right foot radiographs dated 02/07/2000. FINDINGS: No acute fracture around the ankle. No joint dislocation. Fixation hardware is seen in the distal fibula and in the medial malleolus. No significant soft tissue swelling around the ankle. IMPRESSION: 1. No acute osseous injury around the ankle. Electronically Signed   By: Zerita Boers M.D.   On: 06/26/2022 16:27   DG Knee 1-2 Views Right  Result Date: 06/26/2022 CLINICAL DATA:  Fall with leg pain. EXAM: RIGHT KNEE - 1-2 VIEW COMPARISON:  None Available. FINDINGS: There is a comminuted fracture of the proximal shaft of the tibia. There is a segmental fracture of the proximal shaft of the fibula and the fibular neck. The patient is status post total knee replacement. There is no knee joint dislocation or knee joint effusion. There is soft tissue swelling surrounding the leg. IMPRESSION: 1. Comminuted fracture of the proximal shaft of the tibia. 2. Segmental fracture of the proximal shaft of the fibula and fibular neck. Electronically Signed   By: Zerita Boers M.D.   On: 06/26/2022 16:27   DG Tibia/Fibula Right  Result Date: 06/26/2022 CLINICAL DATA:  Fall with leg pain. EXAM: RIGHT TIBIA AND FIBULA - 2 VIEW COMPARISON:  None Available. FINDINGS: There is a comminuted fracture of the proximal shaft of the tibia. There is a segmental  fracture of the proximal shaft of the fibula and the fibular neck. The patient is status post total knee replacement. Fixation hardware is seen in the distal fibula and in the medial malleolus. There is soft  tissue swelling surrounding the leg. IMPRESSION: 1. Comminuted fracture of the proximal shaft of the tibia. 2. Segmental fracture of the proximal shaft of the fibula and fibular neck. Electronically Signed   By: Zerita Boers M.D.   On: 06/26/2022 16:25   CT Head Wo Contrast  Result Date: 06/26/2022 CLINICAL DATA:  Head and neck trauma. EXAM: CT HEAD WITHOUT CONTRAST CT CERVICAL SPINE WITHOUT CONTRAST TECHNIQUE: Multidetector CT imaging of the head and cervical spine was performed following the standard protocol without intravenous contrast. Multiplanar CT image reconstructions of the cervical spine were also generated. RADIATION DOSE REDUCTION: This exam was performed according to the departmental dose-optimization program which includes automated exposure control, adjustment of the mA and/or kV according to patient size and/or use of iterative reconstruction technique. COMPARISON:  Head CT 05/07/2008 FINDINGS: CT HEAD FINDINGS Brain: Ventricles, cisterns and other CSF spaces are normal. There is chronic ischemic microvascular disease. No mass, mass effect, shift of midline structures or acute hemorrhage. No evidence of acute infarction. Vascular: No hyperdense vessel or unexpected calcification. Skull: Normal. Negative for fracture or focal lesion. Sinuses/Orbits: No acute finding. Other: None. CT CERVICAL SPINE FINDINGS Alignment: No posttraumatic subluxation. Skull base and vertebrae: Vertebral body heights are maintained. There is mild moderate spondylosis throughout the cervical spine to include uncovertebral joint spurring and facet arthropathy. Mild bilateral neural foraminal narrowing at the C4-5 level with right-sided neural foraminal narrowing at the C5-6 level. Moderate right-sided neural foraminal narrowing at the C6-7 level. No acute fracture. Soft tissues and spinal canal: No prevertebral fluid or swelling. No visible canal hematoma. Disc levels: Moderate disc space narrowing at the C4-5, C5-6 and  C6-7 levels. Upper chest: No acute findings. Other: None. IMPRESSION: 1. No acute brain injury. 2. Chronic ischemic microvascular disease. 3. No acute cervical spine injury. 4. Mild-to-moderate spondylosis throughout the cervical spine with disc disease at the C4-5, C5-6 and C6-7 levels. Neural foraminal narrowing as described. Electronically Signed   By: Marin Olp M.D.   On: 06/26/2022 16:24   CT Cervical Spine Wo Contrast  Result Date: 06/26/2022 CLINICAL DATA:  Head and neck trauma. EXAM: CT HEAD WITHOUT CONTRAST CT CERVICAL SPINE WITHOUT CONTRAST TECHNIQUE: Multidetector CT imaging of the head and cervical spine was performed following the standard protocol without intravenous contrast. Multiplanar CT image reconstructions of the cervical spine were also generated. RADIATION DOSE REDUCTION: This exam was performed according to the departmental dose-optimization program which includes automated exposure control, adjustment of the mA and/or kV according to patient size and/or use of iterative reconstruction technique. COMPARISON:  Head CT 05/07/2008 FINDINGS: CT HEAD FINDINGS Brain: Ventricles, cisterns and other CSF spaces are normal. There is chronic ischemic microvascular disease. No mass, mass effect, shift of midline structures or acute hemorrhage. No evidence of acute infarction. Vascular: No hyperdense vessel or unexpected calcification. Skull: Normal. Negative for fracture or focal lesion. Sinuses/Orbits: No acute finding. Other: None. CT CERVICAL SPINE FINDINGS Alignment: No posttraumatic subluxation. Skull base and vertebrae: Vertebral body heights are maintained. There is mild moderate spondylosis throughout the cervical spine to include uncovertebral joint spurring and facet arthropathy. Mild bilateral neural foraminal narrowing at the C4-5 level with right-sided neural foraminal narrowing at the C5-6 level. Moderate right-sided neural foraminal narrowing at the C6-7 level. No acute  fracture.  Soft tissues and spinal canal: No prevertebral fluid or swelling. No visible canal hematoma. Disc levels: Moderate disc space narrowing at the C4-5, C5-6 and C6-7 levels. Upper chest: No acute findings. Other: None. IMPRESSION: 1. No acute brain injury. 2. Chronic ischemic microvascular disease. 3. No acute cervical spine injury. 4. Mild-to-moderate spondylosis throughout the cervical spine with disc disease at the C4-5, C5-6 and C6-7 levels. Neural foraminal narrowing as described. Electronically Signed   By: Marin Olp M.D.   On: 06/26/2022 16:24   DG Chest Portable 1 View  Result Date: 06/26/2022 CLINICAL DATA:  Fall from standing. EXAM: PORTABLE CHEST 1 VIEW COMPARISON:  Chest radiograph dated 06/23/2020. FINDINGS: The heart is enlarged. Vascular calcifications are seen in the aortic arch. The lungs are clear. There is no pleural effusion or pneumothorax. Emphysematous changes are noted. Degenerative changes are seen in the spine. IMPRESSION: 1. No active disease. 2. Cardiomegaly. Electronically Signed   By: Zerita Boers M.D.   On: 06/26/2022 16:22   DG Pelvis Portable  Result Date: 06/26/2022 CLINICAL DATA:  Fall EXAM: PORTABLE PELVIS 1-2 VIEWS COMPARISON:  08/05/2019, 08/03/2019 FINDINGS: SI joints are non widened. Pubic symphysis and rami appear intact. Status post left hip replacement with intact hardware and normal alignment. No definitive fracture is seen IMPRESSION: Status post left hip replacement. No acute osseous abnormality. Electronically Signed   By: Donavan Foil M.D.   On: 06/26/2022 16:22    Pending Labs Unresulted Labs (From admission, onward)     Start     Ordered   06/27/22 0500  CBC  Tomorrow morning,   R        06/26/22 1713   06/27/22 [id number]  Basic metabolic panel  Tomorrow morning,   R        06/26/22 1713   06/26/22 1629  Digoxin level  Once,   STAT        06/26/22 1628            Vitals/Pain Today's Vitals   06/26/22 1526 06/26/22 1600 06/26/22 1615  06/26/22 1618  BP: 110/72 (!) 136/96 122/87   Pulse:  83 91   Resp:  (!) 21 17   Temp:  (!) 97.2 F (36.2 C)    TempSrc:  Temporal    SpO2:  98% 99%   Weight:      Height:      PainSc:    8     Isolation Precautions No active isolations  Medications Medications  HYDROcodone-acetaminophen (NORCO/VICODIN) 5-325 MG per tablet 1 tablet (has no administration in time range)  morphine (PF) 2 MG/ML injection 0.5 mg (has no administration in time range)  morphine (PF) 2 MG/ML injection 2 mg (2 mg Intravenous Given 06/26/22 1543)    Mobility Walks w walker     Focused Assessments Neuro Assessment Handoff:  Swallow screen pass?  Cardiac Rhythm: Atrial fibrillation       Neuro Assessment:   Neuro Checks:      Has TPA been given? No If patient is a Neuro Trauma and patient is going to OR before floor call report to [facility] nurse: [phone] or [phone]   R Recommendations: See Admitting Provider Note  Report given to:   Additional Notes: Axox4 VSS

## 2022-06-26 NOTE — H&P (Addendum)
History and Physical    Patient: [name] [id number] DOB: 1930-06-17 DOA: 06/26/2022 DOS: the patient was seen and examined on 06/26/2022 PCP: [name], MD  Patient coming from: Home via EMS  Chief Complaint:  Chief Complaint  Patient presents with   level 2 fall on thinners   Fall   HPI: [name] is a 89 y.o. female with medical history significant of atrial fibrillation on Eliquis, combined systolic and diastolic CHF, anxiety, depression, and GERD who presents after having a fall at home.  At baseline patient uses a walker at baseline.  Patient had been trying to push some trash out of the way which caused her to lose her balance and fall with her right leg folding underneath her body.  Patient hit her head, but denied any loss of consciousness.  She had immediate pain in her right leg and it was noted to be deformed.  Patient had already taken her morning dose of Eliquis as well as other medications.  She had just recently undergone ABI with TBI which was noted to have abnormal TBI's on both lower extremities.  Patient has had several previous orthopedic surgeries by Dr.Dalldorf in the past the last being or followed by right ankle fracture back in 09/2020.  In the emergency department patient was afebrile with heart rates 83-120, and all other vital signs maintained.  Labs significant for sodium 134, chloride 96, and all other labs relatively maintained.  X-rays noted a communicated fracture of the proximal shaft of the tibia with segmental fracture of the proximal shaft of the fibula and fibular neck on the right leg.  Chest x-ray noted cardiomegaly without active disease.  Orthopedics had been consulted.  Patient has been given 2 mg morphine IV.     Review of Systems: As mentioned in the history of present illness. All other systems reviewed and are negative. Past Medical History:  Diagnosis Date   Acute combined systolic and diastolic heart failure ([place]) 05/02/2017    Anemia    Anxiety    Arthritis    "knees" (06/18/2015)   Atrial fibrillation with RVR ([place]) 06/18/2015   h/o sig bleed on coumadin   Cardiogenic shock [id number]   Complication of anesthesia 2010   "w/cardiac cath; got into a psychotic state for ~ 3 days; got me out w/Valium"   Depression    "I have it off and on; not as often as I've gotten older" (06/18/2015)   Diverticulosis    descending and sigmoid colon--Dr. Sharlett Iles   DJD (degenerative joint disease) of knee    left   Dyspnea    with exertion and in morning mostly when wakes up    E coli infection    History of   GERD (gastroesophageal reflux disease)    History of blood transfusion    History of Clostridium difficile colitis    IBS (irritable bowel syndrome)    Insomnia     off chronic ambien 5mg  as of 10/11, rare/episodic use since then  DCM/CHF   LBBB (left bundle branch block)    Lymphocytic colitis 09/27/2016   Moderate to severe pulmonary hypertension ([place]) 11/26/2019   Nonischemic cardiomyopathy ([place])    normal coronaries, EF 15-20% 04/2008, EF 50-55% 2015   Osteoporosis    on DXA 05/2010   Positive PPD    Retroperitoneal bleed    Sciatica    Right   Urinary incontinence    Occassionally   Past Surgical History:  Procedure Laterality  Date   ANTERIOR APPROACH HEMI HIP ARTHROPLASTY Left 08/05/2019   Procedure: ANTERIOR APPROACH HEMI HIP ARTHROPLASTY;  Surgeon: Dorna Leitz, MD;  Location: [facility];  Service: Orthopedics;  Laterality: Left;   BIOPSY  11/22/2017   Procedure: BIOPSY;  Surgeon: Gatha Mayer, MD;  Location: [facility];  Service: Endoscopy;;   CARDIAC CATHETERIZATION  2010   CARDIOVERSION N/A 06/19/2015   Procedure: CARDIOVERSION;  Surgeon: Larey Dresser, MD;  Location: [facility];  Service: Cardiovascular;  Laterality: N/A;   CATARACT EXTRACTION W/ INTRAOCULAR LENS  IMPLANT, BILATERAL Bilateral    COLONOSCOPY WITH PROPOFOL N/A 11/22/2017   Procedure: COLONOSCOPY WITH PROPOFOL;  Surgeon:  Gatha Mayer, MD;  Location: [facility];  Service: Endoscopy;  Laterality: N/A;   DILATION AND CURETTAGE OF UTERUS     ORIF ANKLE FRACTURE Right 09/22/2020   Procedure: RIGHT ANKLE OPEN REDUCTION INTERNAL FIXATION (ORIF);  Surgeon: Melrose Nakayama, MD;  Location: [facility];  Service: Orthopedics;  Laterality: Right;   TEE WITHOUT CARDIOVERSION N/A 06/19/2015   Procedure: TRANSESOPHAGEAL ECHOCARDIOGRAM (TEE);  Surgeon: Larey Dresser, MD;  Location: [facility];  Service: Cardiovascular;  Laterality: N/A;   TOTAL KNEE ARTHROPLASTY Left 04/25/2017   Procedure: TOTAL KNEE ARTHROPLASTY;  Surgeon: Melrose Nakayama, MD;  Location: [facility];  Service: Orthopedics;  Laterality: Left;   TOTAL KNEE ARTHROPLASTY Right 02/18/2020   Procedure: RIGHT TOTAL KNEE ARTHROPLASTY;  Surgeon: Melrose Nakayama, MD;  Location: [facility];  Service: Orthopedics;  Laterality: Right;   TOTAL KNEE ARTHROPLASTY Right 06/16/2020   Procedure: RIGHT RETINACULAR REPAIR;  Surgeon: Melrose Nakayama, MD;  Location: [facility];  Service: Orthopedics;  Laterality: Right;   TOTAL KNEE REVISION Left 12/03/2019   Procedure: LEFT TOTAL KNEE REVISION  OF POYLY EXCHANGE;  Surgeon: Melrose Nakayama, MD;  Location: [facility];  Service: Orthopedics;  Laterality: Left;   VAGINAL HYSTERECTOMY  1979   ovaries intact   Social History:  reports that she has never smoked. She has never used smokeless tobacco. She reports that she does not drink alcohol and does not use drugs.  Allergies  Allergen Reactions   Ace Inhibitors Cough   Warfarin Sodium Other (See Comments)    DOSE RELATED PHARMACOLOGIC EFFECT "bleed out"   Famotidine Other (See Comments)    GI upset   Vancomycin Nausea Only   Codeine Other (See Comments)    sedation   Delsym [Dextromethorphan Polistirex Er] Other (See Comments)    dizziness   Lactose Intolerance (Gi) Diarrhea   Lasix [Furosemide] Diarrhea    Oral Lasix gives her diarrhea, tolerates IV Rx   Phenylephrine Palpitations and Other  (See Comments)    Nasal spray- "likely increase in nasal congestion".    Sulfamethoxazole-Trimethoprim Nausea And Vomiting    GI intolerance.    Family History  Problem Relation Age of Onset   Colon cancer Father        possible colon cancer   Other Father        esophagus tumor?   Tuberculosis Mother    Breast cancer Neg Hx    Stomach cancer Neg Hx    Pancreatic cancer Neg Hx     Prior to Admission medications   Medication Sig Start Date End Date Taking? Authorizing Provider  acetaminophen (TYLENOL) 500 MG tablet Take 1,000 mg by mouth in the morning and at bedtime.    [provider]  apixaban (ELIQUIS) 2.5 MG TABS tablet TAKE 1 TABLET BY MOUTH TWICE A DAY 04/28/22   Croitoru, [place],  MD  calcium carbonate (TUMS - DOSED IN MG ELEMENTAL CALCIUM) 500 MG chewable tablet Chew 500 mg by mouth 2 (two) times daily as needed for indigestion or heartburn.    [provider]  cholecalciferol (VITAMIN D3) 25 MCG (1000 UNIT) tablet Take 1,000 Units by mouth in the morning.    [provider]  cholestyramine (QUESTRAN) 4 g packet TAKE 1 PACKET (4 G TOTAL) BY MOUTH DAILY WITH SUPPER. 04/12/22   Gatha Mayer, MD  Cranberry 180 MG CAPS Take 180 mg by mouth daily in the afternoon.    [provider]  digoxin (LANOXIN) 0.125 MG tablet 1/4 tab by mouth daily. 06/17/22   [name], MD  fluticasone Asencion Islam) 50 MCG/ACT nasal spray Place 2 sprays into both nostrils daily. 11/10/21   [name], MD  KLOR-CON M20 20 MEQ tablet TAKE 2 TABLETS BY MOUTH DAILY 12/20/21   [name], MD  loperamide (IMODIUM) 2 MG capsule Take 2 mg by mouth 3 (three) times daily as needed for diarrhea or loose stools.     [provider]  loratadine (CLARITIN) 10 MG tablet Take 10 mg by mouth daily.    [provider]  metoprolol succinate (TOPROL-XL) 100 MG 24 hr tablet Take 1.5 tablets (150 mg total) by mouth daily. 04/22/22   [name], MD  pantoprazole  (PROTONIX) 20 MG tablet Take 1 tablet (20 mg total) by mouth daily. 06/22/22   [name], MD  torsemide (DEMADEX) 10 MG tablet Take 1 tablet (10 mg total) by mouth daily. Dry weight is 149. If you are above this, take 20mg  torsemide. 05/20/22   Croitoru, Mihai, MD  traMADol (ULTRAM) 50 MG tablet Take 1 tablet (50 mg total) by mouth 3 (three) times daily as needed. 06/17/22   [name], MD  venlafaxine XR (EFFEXOR-XR) 37.5 MG 24 hr capsule Take 2 capsules (75 mg total) by mouth every morning. 02/03/22   [name], MD    Physical Exam: Vitals:   06/26/22 1525 06/26/22 1526 06/26/22 1600 06/26/22 1615  BP:  110/72 (!) 136/96 122/87  Pulse: (!) 110  83 91  Resp: 20  (!) 21 17  Temp:   (!) 97.2 F (36.2 C)   TempSrc:   Temporal   SpO2: 94%  98% 99%  Weight:      Height:       Constitutional: Elderly female currently in no acute distress Eyes: PERRL, lids and conjunctivae normal ENMT: Mucous membranes are moist.  Normal dentition.  Neck: normal, supple, no masses, no thyromegaly Respiratory: clear to auscultation bilaterally with no significant wheezes or crackles appreciated.  Patient on 2 L nasal cannula oxygen at this time. Cardiovascular: Irregular irregular.  Trace edema noted of the left lower extremity. Abdomen: no tenderness, no masses palpated. No hepatosplenomegaly. Bowel sounds positive.  Musculoskeletal: no clubbing / cyanosis.  Deformity present of the right leg with distal aspect externally rotated. Skin: no rashes, lesions, ulcers. No induration Neurologic: CN 2-12 grossly intact.   Strength 5/5 in all 4.  Psychiatric: Normal judgment and insight. Alert and oriented x 3. Normal mood.   Data Reviewed:  Atrial fibrillation at 103 bpm.  Reviewed labs, imaging, and pertinent records as noted above in the HPI  Assessment and Plan: Right tibial/fibula fracture secondary to fall Acute.  Patient reports having a fall after losing her balance while using her  walker this morning.  X-rays noted patient to have a communicated fracture  of the proximal shaft of the tibia with segmental fracture of the proximal shaft of the fibula and fibular neck on the right leg.  Dr. Mable Fill of orthopedics have been formally consulted. -Admit to a surgical telemetry bed -N.p.o. after midnight -Hip fracture order set utilized -Hydrocodone/morphine IV as needed for moderate to severe pain respectively -Appreciate orthopedic consultative services we will follow-up for any further recommendations  Persistent atrial fibrillation on chronic anticoagulation Patient is currently in atrial fibrillation but appears relatively rate controlled.  Had last taken Eliquis this morning. CHA2DS2-VASc score equal to at least 5. -Goal potassium at least 4 and magnesium at least 2 -Hold Eliquis, but consider need of heparin drip if surgery is not planned for tomorrow  Chronic combined systolic and diastolic CHF Chest x-ray noted cardiomegaly without acute abnormality.  Last echocardiogram noted EF to be 25 to 30% with global hypokinesis of left ventricle. -Strict I&Os and daily weights -Check BNP -Continue torsemide  Essential hypertension Blood pressure currently maintained. -Continue metoprolol as tolerated  Hyponatremia Hypochloremia Chronic.  Initially sodium was noted mildly low at 134 and chloride 96.  Given patient history of CHF  will avoid IV fluids. -Recheck BMP in a.m.  PAD ABIs were within normal limits, but TBI on the right was noted to be 0.5 and on the left 0.49 with a recent study done on 06/24/2022. -Continue outpatient follow-up with cardiology  Anxiety and depression -Continue venlafaxine  DVT prophylaxis: Holding Eliquis Advance Care Planning:   Code Status: Full Code    Consults: Orthopedics  Family Communication: Patient daughter updated at bedside  Severity of Illness: The appropriate patient status for this patient is INPATIENT. Inpatient status  is judged to be reasonable and necessary in order to provide the required intensity of service to ensure the patient's safety. The patient's presenting symptoms, physical exam findings, and initial radiographic and laboratory data in the context of their chronic comorbidities is felt to place them at high risk for further clinical deterioration. Furthermore, it is not anticipated that the patient will be medically stable for discharge from the hospital within 2 midnights of admission.   * I certify that at the point of admission it is my clinical judgment that the patient will require inpatient hospital care spanning beyond 2 midnights from the point of admission due to high intensity of service, high risk for further deterioration and high frequency of surveillance required.*  Author: Norval Morton, MD 06/26/2022 5:03 PM  For on call review [email or web address].

## 2022-06-26 NOTE — ED Provider Notes (Signed)
[facility] Provider Note   CSN: [id number] Arrival date & time: 06/26/22  1457     History  Chief Complaint  Patient presents with   level 2 fall on thinners   Fall    [name] is a 89 y.o. female.  89 year old female with prior medical history as detailed below presents for evaluation.  She was at home at home.  She was in her bathroom when she tripped while maneuvering with her walker.  She fell fell onto the bathroom floor.  She did strike her head.  She denies neck pain.  She denies LOC.  She is on Eliquis.  Last dose of Eliquis was taken this morning.  As she fell her right leg folded underneath her.  She complains primarily of pain to the right lower extremity.  She is known to Dr. Rhona Raider with [facility].  EMS administered 25 mcg of fentanyl during transport.    The history is provided by the patient and medical records.       Home Medications Prior to Admission medications   Medication Sig Start Date End Date Taking? Authorizing Provider  acetaminophen (TYLENOL) 500 MG tablet Take 1,000 mg by mouth in the morning and at bedtime.    [provider]  apixaban (ELIQUIS) 2.5 MG TABS tablet TAKE 1 TABLET BY MOUTH TWICE A DAY 04/28/22   Croitoru, Mihai, MD  calcium carbonate (TUMS - DOSED IN MG ELEMENTAL CALCIUM) 500 MG chewable tablet Chew 500 mg by mouth 2 (two) times daily as needed for indigestion or heartburn.    [provider]  cholecalciferol (VITAMIN D3) 25 MCG (1000 UNIT) tablet Take 1,000 Units by mouth in the morning.    [provider]  cholestyramine (QUESTRAN) 4 g packet TAKE 1 PACKET (4 G TOTAL) BY MOUTH DAILY WITH SUPPER. 04/12/22   Gatha Mayer, MD  Cranberry 180 MG CAPS Take 180 mg by mouth daily in the afternoon.    [provider]  digoxin (LANOXIN) 0.125 MG tablet 1/4 tab by mouth daily. 06/17/22   [name], MD  fluticasone Asencion Islam) 50 MCG/ACT  nasal spray Place 2 sprays into both nostrils daily. 11/10/21   [name], MD  KLOR-CON M20 20 MEQ tablet TAKE 2 TABLETS BY MOUTH DAILY 12/20/21   [name], MD  loperamide (IMODIUM) 2 MG capsule Take 2 mg by mouth 3 (three) times daily as needed for diarrhea or loose stools.     [provider]  loratadine (CLARITIN) 10 MG tablet Take 10 mg by mouth daily.    [provider]  metoprolol succinate (TOPROL-XL) 100 MG 24 hr tablet Take 1.5 tablets (150 mg total) by mouth daily. 04/22/22   [name], MD  pantoprazole (PROTONIX) 20 MG tablet Take 1 tablet (20 mg total) by mouth daily. 06/22/22   [name], MD  torsemide (DEMADEX) 10 MG tablet Take 1 tablet (10 mg total) by mouth daily. Dry weight is 149. If you are above this, take 20mg  torsemide. 05/20/22   Croitoru, Mihai, MD  traMADol (ULTRAM) 50 MG tablet Take 1 tablet (50 mg total) by mouth 3 (three) times daily as needed. 06/17/22   [name], MD  venlafaxine XR (EFFEXOR-XR) 37.5 MG 24 hr capsule Take 2 capsules (75 mg total) by mouth every morning. 02/03/22   [name], MD      Allergies    Ace inhibitors, Warfarin sodium, Famotidine, Vancomycin,  Codeine, Delsym [dextromethorphan polistirex er], Lactose intolerance (gi), Lasix [furosemide], Phenylephrine, and Sulfamethoxazole-trimethoprim    Review of Systems   Review of Systems  All other systems reviewed and are negative.   Physical Exam Updated Vital Signs BP 110/72   Pulse (!) 110   Resp 20   Ht 5\' 5"  (1.651 m)   Wt 68 kg   SpO2 94%   BMI 24.96 kg/m  Physical Exam Vitals and nursing note reviewed.  Constitutional:      General: She is not in acute distress.    Appearance: Normal appearance. She is well-developed.  HENT:     Head: Normocephalic.     Comments: Superficial contusion noted to the posterior aspect of the scalp.  No active bleeding.  No break in skin. Eyes:     Conjunctiva/sclera: Conjunctivae normal.      Pupils: Pupils are equal, round, and reactive to light.  Cardiovascular:     Rate and Rhythm: Normal rate and regular rhythm.     Heart sounds: Normal heart sounds.  Pulmonary:     Effort: Pulmonary effort is normal. No respiratory distress.     Breath sounds: Normal breath sounds.  Abdominal:     General: There is no distension.     Palpations: Abdomen is soft.     Tenderness: There is no abdominal tenderness.  Musculoskeletal:        General: Tenderness present. No deformity. Normal range of motion.     Cervical back: Normal range of motion and neck supple.     Comments: Tenderness and deformity noted to the proximal aspect of the right tib-fib.  No open wound.  Distal right lower extremity is neurovascular intact.  Skin:    General: Skin is warm and dry.  Neurological:     General: No focal deficit present.     Mental Status: She is alert and oriented to person, place, and time. Mental status is at baseline.     ED Results / Procedures / Treatments   Labs (all labs ordered are listed, but only abnormal results are displayed) Labs Reviewed  BASIC METABOLIC PANEL - Abnormal; Notable for the following components:      Result Value   Sodium 134 (*)    Chloride 96 (*)    Glucose, Bld 104 (*)    All other components within normal limits  DIGOXIN LEVEL - Abnormal; Notable for the following components:   Digoxin Level 0.5 (*)    All other components within normal limits  CBC WITH DIFFERENTIAL/PLATELET  ETHANOL  CBC  BASIC METABOLIC PANEL  BRAIN NATRIURETIC PEPTIDE  MAGNESIUM  URINALYSIS, ROUTINE W REFLEX MICROSCOPIC  TYPE AND SCREEN    EKG EKG Interpretation  Date/Time:  Sunday June 26 2022 15:50:33 EDT Ventricular Rate:  103 PR Interval:    QRS Duration: 154 QT Interval:  398 QTC Calculation: 521 R Axis:   267 Text Interpretation: Atrial fibrillation Left bundle branch block Confirmed by Dene Gentry [phone]) on 06/26/2022 3:52:29 PM  Radiology VAS [place] LOWER EXT  ART SEG MULTI (SEGMENTALS & LE RAYNAUDS)  Result Date: 06/24/2022  LOWER EXTREMITY DOPPLER STUDY Patient Name:  [name]  Date of Exam:   06/24/2022 Medical Rec #: [id number]        Accession #:    [id number] Date of Birth: [birth year]        Patient Gender: F Patient Age:   66 years Exam Location:  [facility] Procedure:      VAS [place] LOWER  EXT ART SEG MULTI (SEGMENTALS & LE RAYNAUDS) Referring Phys: [id number] ARIDA --------------------------------------------------------------------------------  Indications: Patient reports for the last 2-3 months having intermittent pain in              her right heel and toes that is worse at night. The pain is better              with walking. The pain has improved in the last two to three days              with cream provided by the doctor. Patient reports blue and black              spots on her great toe and fourth toe that comes and goes. Paient              denies any ture claudication symptoms. Patient does have one small              eraser sized black discoloration on right great toe. High Risk Factors: No history of smoking, prior MI. Other Factors: Patient has screws in her right ankle from a broken ankle two                years ago. AFIB.  Comparison Study: NA Performing Technologist: Leavy Cella RDCS  Examination Guidelines: A complete evaluation includes at minimum, Doppler waveform signals and systolic blood pressure reading at the level of bilateral brachial, anterior tibial, and posterior tibial arteries, when vessel segments are accessible. Bilateral testing is considered an integral part of a complete examination. Photoelectric Plethysmograph (PPG) waveforms and toe systolic pressure readings are included as required and additional duplex testing as needed. Limited examinations for reoccurring indications may be performed as noted.  ABI Findings: +---------+------------------+-----+---------+--------+ Right    Rt Pressure (mmHg)IndexWaveform Comment   +---------+------------------+-----+---------+--------+ Brachial 105                                      +---------+------------------+-----+---------+--------+ CFA                             triphasic         +---------+------------------+-----+---------+--------+ Popliteal                       triphasic         +---------+------------------+-----+---------+--------+ PTA      130               1.15 triphasic         +---------+------------------+-----+---------+--------+ PERO     116               1.03 biphasic          +---------+------------------+-----+---------+--------+ DP       106               0.94 triphasic         +---------+------------------+-----+---------+--------+ Great Toe57                0.50 Abnormal          +---------+------------------+-----+---------+--------+ +---------+------------------+-----+---------+-------+ Left     Lt Pressure (mmHg)IndexWaveform Comment +---------+------------------+-----+---------+-------+ Brachial 113                                     +---------+------------------+-----+---------+-------+  CFA                             triphasic        +---------+------------------+-----+---------+-------+ Popliteal                       triphasic        +---------+------------------+-----+---------+-------+ PTA      151               1.34 triphasic        +---------+------------------+-----+---------+-------+ PERO     136               1.30 triphasic        +---------+------------------+-----+---------+-------+ DP       130               1.15 triphasic        +---------+------------------+-----+---------+-------+ Great Toe55                0.49 Abnormal         +---------+------------------+-----+---------+-------+ +-------+-----------+-----------+------------+------------+ ABI/TBIToday's ABIToday's TBIPrevious ABIPrevious TBI  +-------+-----------+-----------+------------+------------+ Right  1.15       0.50                                +-------+-----------+-----------+------------+------------+ Left   1.34       0.49                                +-------+-----------+-----------+------------+------------+   Summary: Right: Resting right ankle-brachial index is within normal range. The right toe-brachial index is abnormal. Left: Resting left ankle-brachial index is within normal range. The left toe-brachial index is abnormal. *See table(s) above for measurements and observations.  Electronically signed by Quay Burow MD on 06/24/2022 at 7:52:05 PM.    Final     Procedures Procedures    Medications Ordered in ED Medications  morphine (PF) 2 MG/ML injection 2 mg (2 mg Intravenous Given 06/26/22 1543)    ED Course/ Medical Decision Making/ A&P                             Medical Decision Making Amount and/or Complexity of Data Reviewed Labs: ordered. Radiology: ordered.  Risk Prescription drug management.    Medical Screen Complete  This patient presented to the ED with complaint of fall, right leg injury.  This complaint involves an extensive number of treatment options. The initial differential diagnosis includes, but is not limited to, trauma from fall  This presentation is: Acute, Self-Limited, Previously Undiagnosed, Uncertain Prognosis, Complicated, Systemic Symptoms, and Threat to Life/Bodily Function  Patient presents after fall at home.  Patient injured her right leg as she fell.  She did strike her head.  She is on Eliquis.  Imaging is notable for right tib-fib fracture.  No other traumatic injury identified during workup.  Case discussed with Dr. Mable Fill with [facility].  Ortho will consult.  Patient will require admission.  Hospitalist service is aware of case and will evaluate for same. Patient understands plan of care.   Additional history  obtained:  Additional history obtained from EMS External records from outside sources obtained and reviewed including prior ED visits and prior Inpatient records.    Lab Tests:  I ordered and personally  interpreted labs.  The pertinent results include: CBC, BMP, type and screen, digoxin level, EtOH   Imaging Studies ordered:  I ordered imaging studies including plain films of right ankle, right knee, right tib-fib, CTs of head and C-spine.  Plain films of chest and pelvis. I independently visualized and interpreted obtained imaging which showed right tib-fib fracture. I agree with the radiologist interpretation.   Medicines ordered:  I ordered medication including morphine for pain Reevaluation of the patient after these medicines showed that the patient: improved  Problem List / ED Course:  Fall, head injury, right tib-fib fracture   Reevaluation:  After the interventions noted above, I reevaluated the patient and found that they have: improved Disposition:  After consideration of the diagnostic results and the patients response to treatment, I feel that the patent would benefit from admission.          Final Clinical Impression(s) / ED Diagnoses Final diagnoses:  Fall, initial encounter  Injury of head, initial encounter  Closed fracture of right tibia and fibula, initial encounter    Rx / DC Orders ED Discharge Orders     None         Valarie Merino, MD 06/26/22 1755

## 2022-06-26 NOTE — Progress Notes (Signed)
   06/26/22 1500  Spiritual Encounters  Type of Visit Initial  Care provided to: Patient  Referral source Trauma page  Reason for visit Trauma  OnCall Visit Yes  Interventions  Spiritual Care Interventions Made Compassionate presence;Reflective listening  Intervention Outcomes  Outcomes Connection to spiritual care;Reduced anxiety;Reduced isolation   Chaplain responded to level 2 trauma, fall on thinners. Patient was pleasant and welcoming but did not need any additional chaplain services at this time.   Note prepared by Abbott Pao, chaplain resident [phone]

## 2022-06-26 NOTE — ED Triage Notes (Addendum)
Pt BIB EMS due to fall on thinners. Pt had mechanical fall in her bathroom, tripped over walker. Pt is on elliquis and took it this morning. Pt is axox4. Pt denies LOC but hot head. VSS. Pt c/o right hip pain.

## 2022-06-26 NOTE — Consult Note (Signed)
Orthopaedic Consult  Date/Time: 06/26/22 5:14 PM  Patient Name: [name]  Attending Physician: Norval Morton, MD    ASSESSMENT & PLAN  Orthopaedic Assessment: 89 y.o. female with right periprosthetic tibia and fibula fractures below well-fixed total knee arthroplasty prosthesis, above previous bimalleolar ORIF, both with Dr. Rhona Raider.  Plan: Admit to medical service for preoperative clearance, restratification, and optimization.  Nonweightbearing right lower extremity.  Long-leg splint to right lower extremity to stabilize fracture.  Hold anticoagulation in anticipation of OR.  Plan for OR in trauma room possibly 06/27/2022, but may need to delay to 06/28/2022 given Eliquis.   Georgeanna Harrison M.D. Orthopaedic Surgery [facility]   Medical Decision Making  Amount/complexity of data: Is there a current pathologic fracture (e.g. neoplastic, osteoporotic insufficiency fracture)? Yes Independent interpretation of radiographic studies: Yes Review of radiology results (e.g. reports): Yes Tests ordered (e.g. additional radiographic studies, labs): No Lab results reviewed: Yes Reviewed old records: Yes History from another source (independent historian, e.g. family/friend/etc.): Yes Discussion of imaging, clinical data, and or management with independent medical provider: Yes Risk: Patient receiving IV controlled substances for pain: Yes Fracture requiring manipulation: No Urgent or emergent (non-elective) surgery likely this admission: Yes Presence of medical comorbidities and/or surgical risk factors (e.g. current smoker, CAD, diabetes, COPD, CKD, etc.): Yes Closed fracture management WITHOUT manipulation: No Urgent minor procedure (e.g. joint aspiration, compartment pressure measurement, etc.): No Will likely need surgery as an outpatient: No     HPI [name] is a 89 y.o. female. Orthopaedic consultation has specifically been requested to  address this patient's current musculoskeletal presentation.  She is a patient of Dr. Alden Server and has had multiple orthopedic surgeries including bilateral knee replacements and right bimalleolar fracture ORIF performed by him.  She takes Eliquis and last had it today.  She sustained a fall and had immediate pain in the right leg.  Pain is sharp and severe.  Pain is worse movement and better with rest and immobilization.  Since the fall the patient was not able to ambulate or bear weight due to pain.  At baseline the patient is ambulatory with a walker.  She has some pain in the back of her head but denies additional injuries.   PMH Past Medical History:  Diagnosis Date   Acute combined systolic and diastolic heart failure ([place]) 05/02/2017   Anemia    Anxiety    Arthritis    "knees" (06/18/2015)   Atrial fibrillation with RVR ([place]) 06/18/2015   h/o sig bleed on coumadin   Cardiogenic shock [id number]   Complication of anesthesia 2010   "w/cardiac cath; got into a psychotic state for ~ 3 days; got me out w/Valium"   Depression    "I have it off and on; not as often as I've gotten older" (06/18/2015)   Diverticulosis    descending and sigmoid colon--Dr. Sharlett Iles   DJD (degenerative joint disease) of knee    left   Dyspnea    with exertion and in morning mostly when wakes up    E coli infection    History of   GERD (gastroesophageal reflux disease)    History of blood transfusion    History of Clostridium difficile colitis    IBS (irritable bowel syndrome)    Insomnia     off chronic ambien 5mg  as of 10/11, rare/episodic use since then  DCM/CHF   LBBB (left bundle branch block)    Lymphocytic colitis 09/27/2016   Moderate  to severe pulmonary hypertension ([place]) 11/26/2019   Nonischemic cardiomyopathy ([place])    normal coronaries, EF 15-20% 04/2008, EF 50-55% 2015   Osteoporosis    on DXA 05/2010   Positive PPD    Retroperitoneal bleed    Sciatica    Right   Urinary incontinence     Occassionally     PSH Past Surgical History:  Procedure Laterality Date   ANTERIOR APPROACH HEMI HIP ARTHROPLASTY Left 08/05/2019   Procedure: ANTERIOR APPROACH HEMI HIP ARTHROPLASTY;  Surgeon: Dorna Leitz, MD;  Location: [facility];  Service: Orthopedics;  Laterality: Left;   BIOPSY  11/22/2017   Procedure: BIOPSY;  Surgeon: Gatha Mayer, MD;  [facility]: [facility];  Service: Endoscopy;;   CARDIAC CATHETERIZATION  2010   CARDIOVERSION N/A 06/19/2015   Procedure: CARDIOVERSION;  Surgeon: Larey Dresser, MD;  Location: [facility];  Service: Cardiovascular;  Laterality: N/A;   CATARACT EXTRACTION W/ INTRAOCULAR LENS  IMPLANT, BILATERAL Bilateral    COLONOSCOPY WITH PROPOFOL N/A 11/22/2017   Procedure: COLONOSCOPY WITH PROPOFOL;  Surgeon: Gatha Mayer, MD;  [facility];  Service: Endoscopy;  Laterality: N/A;   DILATION AND CURETTAGE OF UTERUS     ORIF ANKLE FRACTURE Right 09/22/2020   Procedure: RIGHT ANKLE OPEN REDUCTION INTERNAL FIXATION (ORIF);  Surgeon: Melrose Nakayama, MD;  [facility];  Service: Orthopedics;  Laterality: Right;   TEE WITHOUT CARDIOVERSION N/A 06/19/2015   Procedure: TRANSESOPHAGEAL ECHOCARDIOGRAM (TEE);  Surgeon: Larey Dresser, MD;  Location: [facility];  Service: Cardiovascular;  Laterality: N/A;   TOTAL KNEE ARTHROPLASTY Left 04/25/2017   Procedure: TOTAL KNEE ARTHROPLASTY;  Surgeon: Melrose Nakayama, MD;  Location: [facility];  Service: Orthopedics;  Laterality: Left;   TOTAL KNEE ARTHROPLASTY Right 02/18/2020   Procedure: RIGHT TOTAL KNEE ARTHROPLASTY;  Surgeon: Melrose Nakayama, MD;  [facility];  Service: Orthopedics;  Laterality: Right;   TOTAL KNEE ARTHROPLASTY Right 06/16/2020   Procedure: RIGHT RETINACULAR REPAIR;  Surgeon: Melrose Nakayama, MD;  [facility];  Service: Orthopedics;  Laterality: Right;   TOTAL KNEE REVISION Left 12/03/2019   Procedure: LEFT TOTAL KNEE REVISION  OF POYLY EXCHANGE;  Surgeon: Melrose Nakayama, MD;  [facility];  Service: Orthopedics;  Laterality: Left;   VAGINAL HYSTERECTOMY  1979   ovaries intact   Home Medications Prior to Admission medications   Medication Sig Start Date End Date Taking? Authorizing Provider  acetaminophen (TYLENOL) 500 MG tablet Take 1,000 mg by mouth in the morning and at bedtime.    [provider]  apixaban (ELIQUIS) 2.5 MG TABS tablet TAKE 1 TABLET BY MOUTH TWICE A DAY 04/28/22   Croitoru, Mihai, MD  calcium carbonate (TUMS - DOSED IN MG ELEMENTAL CALCIUM) 500 MG chewable tablet Chew 500 mg by mouth 2 (two) times daily as needed for indigestion or heartburn.    [provider]  cholecalciferol (VITAMIN D3) 25 MCG (1000 UNIT) tablet Take 1,000 Units by mouth in the morning.    [provider]  cholestyramine (QUESTRAN) 4 g packet TAKE 1 PACKET (4 G TOTAL) BY MOUTH DAILY WITH SUPPER. 04/12/22   Gatha Mayer, MD  Cranberry 180 MG CAPS Take 180 mg by mouth daily in the afternoon.    [provider]  digoxin (LANOXIN) 0.125 MG tablet 1/4 tab by mouth daily. 06/17/22   [name], MD  fluticasone Asencion Islam) 50 MCG/ACT nasal spray Place 2 sprays into both nostrils daily. 11/10/21   [name], MD  Rebeca Allegra  M20 20 MEQ tablet TAKE 2 TABLETS BY MOUTH DAILY 12/20/21   [name], MD  loperamide (IMODIUM) 2 MG capsule Take 2 mg by mouth 3 (three) times daily as needed for diarrhea or loose stools.     [provider]  loratadine (CLARITIN) 10 MG tablet Take 10 mg by mouth daily.    [provider]  metoprolol succinate (TOPROL-XL) 100 MG 24 hr tablet Take 1.5 tablets (150 mg total) by mouth daily. 04/22/22   [name], MD  pantoprazole (PROTONIX) 20 MG tablet Take 1 tablet (20 mg total) by mouth daily. 06/22/22   [name], MD  torsemide (DEMADEX) 10 MG tablet Take 1 tablet (10 mg total) by mouth daily. Dry weight is 149. If you are above this, take 20mg  torsemide. 05/20/22   Croitoru, Mihai, MD  traMADol  (ULTRAM) 50 MG tablet Take 1 tablet (50 mg total) by mouth 3 (three) times daily as needed. 06/17/22   [name], MD  venlafaxine XR (EFFEXOR-XR) 37.5 MG 24 hr capsule Take 2 capsules (75 mg total) by mouth every morning. 02/03/22   [name], MD     Allergies Allergies  Allergen Reactions   Ace Inhibitors Cough   Warfarin Sodium Other (See Comments)    DOSE RELATED PHARMACOLOGIC EFFECT "bleed out"   Famotidine Other (See Comments)    GI upset   Vancomycin Nausea Only   Codeine Other (See Comments)    sedation   Delsym [Dextromethorphan Polistirex Er] Other (See Comments)    dizziness   Lactose Intolerance (Gi) Diarrhea   Lasix [Furosemide] Diarrhea    Oral Lasix gives her diarrhea, tolerates IV Rx   Phenylephrine Palpitations and Other (See Comments)    Nasal spray- "likely increase in nasal congestion".    Sulfamethoxazole-Trimethoprim Nausea And Vomiting    GI intolerance.     Family History Family History  Problem Relation Age of Onset   Colon cancer Father        possible colon cancer   Other Father        esophagus tumor?   Tuberculosis Mother    Breast cancer Neg Hx    Stomach cancer Neg Hx    Pancreatic cancer Neg Hx     Social History Social History   Socioeconomic History   Marital status: Married    Spouse name: Sonia Side    Number of children: 2   Years of education: 13   Highest education level: Not on file  Occupational History   Occupation: Retired    Fish farm manager: RETIRED  Tobacco Use   Smoking status: Never   Smokeless tobacco: Never  Vaping Use   Vaping Use: Never used  Substance and Sexual Activity   Alcohol use: No    Alcohol/week: 0.0 standard drinks of alcohol   Drug use: No   Sexual activity: Not Currently    Birth control/protection: Post-menopausal, Surgical    Comment: Hysterectomy  Other Topics Concern   Not on file  Social History Narrative   Retired, former [employer]   Married, [employer]   Lives with husband    2 grown children, one in [place], one out of state   Tobacco Use - No.    Drug Use - no   teaches Sunday school, reading   Social Determinants of Health   Financial Resource Strain: [place]  (07/12/2021)   Overall Financial Resource Strain (CARDIA)    Difficulty of Paying Living Expenses: Not hard at all  Food Insecurity: No Food Insecurity (07/12/2021)   Hunger Vital Sign    Worried About Running Out of Food in the Last Year: Never true    Ran Out of Food in the Last Year: Never true  Transportation Needs: No Transportation Needs (07/12/2021)   PRAPARE - Hydrologist (Medical): No    Lack of Transportation (Non-Medical): No  Physical Activity: Insufficiently Active (07/12/2021)   Exercise Vital Sign    Days of Exercise per Week: 5 days    Minutes of Exercise per Session: 20 min  Stress: Stress Concern Present (07/12/2021)   [place]    Feeling of Stress : To some extent  Social Connections: Socially Integrated (07/12/2021)   Social Connection and Isolation Panel [NHANES]    Frequency of Communication with Friends and Family: More than three times a week    Frequency of Social Gatherings with Friends and Family: More than three times a week    Attends Religious Services: 1 to 4 times per year    Active Member of [employer] or Organizations: Yes    Attends Archivist Meetings: 1 to 4 times per year    Marital Status: Married  Human resources officer Violence: Not At Risk (07/12/2021)   Humiliation, Afraid, Rape, and Kick questionnaire    Fear of Current or Ex-Partner: No    Emotionally Abused: No    Physically Abused: No    Sexually Abused: No     Review of Systems MSK: As noted per HPI above GI: No current Nausea/vomiting ENT: Denies sore throat, epistaxis CV: Denies chest pain  Resp: No current shortness of breath  Other than mentioned above, there are no Constitutional, Neurological,  Psychiatric, ENT, Ophthalmological, Cardiovascular, Respiratory, GI, GU, Musculoskeletal, Integumentary, Lymphatic, Endocrine or Allergic issues.     Imaging  Independent interpretation of orthopaedic-relevant films: AP and lateral views of the right tibia and fibula demonstrate comminuted fractures of the proximal tibia and fibula below the level of a well-fixed total knee arthroplasty prosthesis cemented tibial component, also above previous bimalleolar ORIF 2 views of the right ankle demonstrate previous bimalleolar fracture ORIF with no acute osseous abnormalities 2 AP projections of the pelvis demonstrate previous left hip hemiarthroplasty, no fractures or acute osseous abnormalities 2 projections of the right knee demonstrate well-fixed cemented total knee arthroplasty components without evidence of fracture extension into the arthroplasty and no evidence of loosening  Radiographic results: DG Ankle Right Port  Result Date: 06/26/2022 CLINICAL DATA:  Fall with leg pain. EXAM: PORTABLE RIGHT ANKLE - 2 VIEW COMPARISON:  Report from right foot radiographs dated 02/07/2000. FINDINGS: No acute fracture around the ankle. No joint dislocation. Fixation hardware is seen in the distal fibula and in the medial malleolus. No significant soft tissue swelling around the ankle. IMPRESSION: 1. No acute osseous injury around the ankle. Electronically Signed   By: Zerita Boers M.D.   On: 06/26/2022 16:27   DG Knee 1-2 Views Right  Result Date: 06/26/2022 CLINICAL DATA:  Fall with leg pain. EXAM: RIGHT KNEE - 1-2 VIEW COMPARISON:  None Available. FINDINGS: There is a comminuted fracture of the proximal shaft of the tibia. There is a segmental fracture of the proximal shaft of the fibula and the fibular neck. The patient is status post total knee replacement. There is no knee joint dislocation or knee joint effusion. There is soft tissue swelling surrounding the leg. IMPRESSION: 1. Comminuted fracture of the  proximal shaft of the tibia. 2. Segmental fracture of the proximal shaft of the fibula and fibular neck. Electronically Signed   By: Zerita Boers M.D.   On: 06/26/2022 16:27   DG Tibia/Fibula Right  Result Date: 06/26/2022 CLINICAL DATA:  Fall with leg pain. EXAM: RIGHT TIBIA AND FIBULA - 2 VIEW COMPARISON:  None Available. FINDINGS: There is a comminuted fracture of the proximal shaft of the tibia. There is a segmental fracture of the proximal shaft of the fibula and the fibular neck. The patient is status post total knee replacement. Fixation hardware is seen in the distal fibula and in the medial malleolus. There is soft tissue swelling surrounding the leg. IMPRESSION: 1. Comminuted fracture of the proximal shaft of the tibia. 2. Segmental fracture of the proximal shaft of the fibula and fibular neck. Electronically Signed   By: Zerita Boers M.D.   On: 06/26/2022 16:25   CT Head Wo Contrast  Result Date: 06/26/2022 CLINICAL DATA:  Head and neck trauma. EXAM: CT HEAD WITHOUT CONTRAST CT CERVICAL SPINE WITHOUT CONTRAST TECHNIQUE: Multidetector CT imaging of the head and cervical spine was performed following the standard protocol without intravenous contrast. Multiplanar CT image reconstructions of the cervical spine were also generated. RADIATION DOSE REDUCTION: This exam was performed according to the departmental dose-optimization program which includes automated exposure control, adjustment of the mA and/or kV according to patient size and/or use of iterative reconstruction technique. COMPARISON:  Head CT 05/07/2008 FINDINGS: CT HEAD FINDINGS Brain: Ventricles, cisterns and other CSF spaces are normal. There is chronic ischemic microvascular disease. No mass, mass effect, shift of midline structures or acute hemorrhage. No evidence of acute infarction. Vascular: No hyperdense vessel or unexpected calcification. Skull: Normal. Negative for fracture or focal lesion. Sinuses/Orbits: No acute finding.  Other: None. CT CERVICAL SPINE FINDINGS Alignment: No posttraumatic subluxation. Skull base and vertebrae: Vertebral body heights are maintained. There is mild moderate spondylosis throughout the cervical spine to include uncovertebral joint spurring and facet arthropathy. Mild bilateral neural foraminal narrowing at the C4-5 level with right-sided neural foraminal narrowing at the C5-6 level. Moderate right-sided neural foraminal narrowing at the C6-7 level. No acute fracture. Soft tissues and spinal canal: No prevertebral fluid or swelling. No visible canal hematoma. Disc levels: Moderate disc space narrowing at the C4-5, C5-6 and C6-7 levels. Upper chest: No acute findings. Other: None. IMPRESSION: 1. No acute brain injury. 2. Chronic ischemic microvascular disease. 3. No acute cervical spine injury. 4. Mild-to-moderate spondylosis throughout the cervical spine with disc disease at the C4-5, C5-6 and C6-7 levels. Neural foraminal narrowing as described. Electronically Signed   By: Marin Olp M.D.   On: 06/26/2022 16:24   CT Cervical Spine Wo Contrast  Result Date: 06/26/2022 CLINICAL DATA:  Head and neck trauma. EXAM: CT HEAD WITHOUT CONTRAST CT CERVICAL SPINE WITHOUT CONTRAST TECHNIQUE: Multidetector CT imaging of the head and cervical spine was performed following the standard protocol without intravenous contrast. Multiplanar CT image reconstructions of the cervical spine were also generated. RADIATION DOSE REDUCTION: This exam was performed according to the departmental dose-optimization program which includes automated exposure control, adjustment of the mA and/or kV according to patient size and/or use of iterative reconstruction technique. COMPARISON:  Head CT 05/07/2008 FINDINGS: CT HEAD FINDINGS Brain: Ventricles, cisterns and other CSF spaces are normal. There is chronic ischemic microvascular disease. No mass, mass effect, shift of midline structures or acute hemorrhage. No evidence of acute  infarction. Vascular: No hyperdense vessel or unexpected calcification.  Skull: Normal. Negative for fracture or focal lesion. Sinuses/Orbits: No acute finding. Other: None. CT CERVICAL SPINE FINDINGS Alignment: No posttraumatic subluxation. Skull base and vertebrae: Vertebral body heights are maintained. There is mild moderate spondylosis throughout the cervical spine to include uncovertebral joint spurring and facet arthropathy. Mild bilateral neural foraminal narrowing at the C4-5 level with right-sided neural foraminal narrowing at the C5-6 level. Moderate right-sided neural foraminal narrowing at the C6-7 level. No acute fracture. Soft tissues and spinal canal: No prevertebral fluid or swelling. No visible canal hematoma. Disc levels: Moderate disc space narrowing at the C4-5, C5-6 and C6-7 levels. Upper chest: No acute findings. Other: None. IMPRESSION: 1. No acute brain injury. 2. Chronic ischemic microvascular disease. 3. No acute cervical spine injury. 4. Mild-to-moderate spondylosis throughout the cervical spine with disc disease at the C4-5, C5-6 and C6-7 levels. Neural foraminal narrowing as described. Electronically Signed   By: Marin Olp M.D.   On: 06/26/2022 16:24   DG Chest Portable 1 View  Result Date: 06/26/2022 CLINICAL DATA:  Fall from standing. EXAM: PORTABLE CHEST 1 VIEW COMPARISON:  Chest radiograph dated 06/23/2020. FINDINGS: The heart is enlarged. Vascular calcifications are seen in the aortic arch. The lungs are clear. There is no pleural effusion or pneumothorax. Emphysematous changes are noted. Degenerative changes are seen in the spine. IMPRESSION: 1. No active disease. 2. Cardiomegaly. Electronically Signed   By: Zerita Boers M.D.   On: 06/26/2022 16:22   DG Pelvis Portable  Result Date: 06/26/2022 CLINICAL DATA:  Fall EXAM: PORTABLE PELVIS 1-2 VIEWS COMPARISON:  08/05/2019, 08/03/2019 FINDINGS: SI joints are non widened. Pubic symphysis and rami appear intact. Status post  left hip replacement with intact hardware and normal alignment. No definitive fracture is seen IMPRESSION: Status post left hip replacement. No acute osseous abnormality. Electronically Signed   By: Donavan Foil M.D.   On: 06/26/2022 16:22   VAS [place] LOWER EXT ART SEG MULTI (SEGMENTALS & LE RAYNAUDS)  Result Date: 06/24/2022  LOWER EXTREMITY DOPPLER STUDY Patient Name:  FIDELA UHLENHAKE  Date of Exam:   06/24/2022 Medical Rec #: [id number]        Accession #:    [id number] Date of Birth: 05/30/1930        Patient Gender: F Patient Age:   40 years Exam Location:  [facility] Procedure:      VAS [place] LOWER EXT ART SEG MULTI (SEGMENTALS & LE RAYNAUDS) Referring Phys: [id number] ARIDA --------------------------------------------------------------------------------  Indications: Patient reports for the last 2-3 months having intermittent pain in              her right heel and toes that is worse at night. The pain is better              with walking. The pain has improved in the last two to three days              with cream provided by the doctor. Patient reports blue and black              spots on her great toe and fourth toe that comes and goes. Paient              denies any ture claudication symptoms. Patient does have one small              eraser sized black discoloration on right great toe. High Risk Factors: No history of smoking, prior MI. Other Factors: Patient has screws in her right ankle  from a broken ankle two                years ago. AFIB.  Comparison Study: NA Performing Technologist: Leavy Cella RDCS  Examination Guidelines: A complete evaluation includes at minimum, Doppler waveform signals and systolic blood pressure reading at the level of bilateral brachial, anterior tibial, and posterior tibial arteries, when vessel segments are accessible. Bilateral testing is considered an integral part of a complete examination. Photoelectric Plethysmograph (PPG) waveforms and toe systolic pressure readings are  included as required and additional duplex testing as needed. Limited examinations for reoccurring indications may be performed as noted.  ABI Findings: +---------+------------------+-----+---------+--------+ Right    Rt Pressure (mmHg)IndexWaveform Comment  +---------+------------------+-----+---------+--------+ Brachial 105                                      +---------+------------------+-----+---------+--------+ CFA                             triphasic         +---------+------------------+-----+---------+--------+ Popliteal                       triphasic         +---------+------------------+-----+---------+--------+ PTA      130               1.15 triphasic         +---------+------------------+-----+---------+--------+ PERO     116               1.03 biphasic          +---------+------------------+-----+---------+--------+ DP       106               0.94 triphasic         +---------+------------------+-----+---------+--------+ Great Toe57                0.50 Abnormal          +---------+------------------+-----+---------+--------+ +---------+------------------+-----+---------+-------+ Left     Lt Pressure (mmHg)IndexWaveform Comment +---------+------------------+-----+---------+-------+ Brachial 113                                     +---------+------------------+-----+---------+-------+ CFA                             triphasic        +---------+------------------+-----+---------+-------+ Popliteal                       triphasic        +---------+------------------+-----+---------+-------+ PTA      151               1.34 triphasic        +---------+------------------+-----+---------+-------+ PERO     136               1.30 triphasic        +---------+------------------+-----+---------+-------+ DP       130               1.15 triphasic        +---------+------------------+-----+---------+-------+ Great Toe55                 0.49 Abnormal         +---------+------------------+-----+---------+-------+ +-------+-----------+-----------+------------+------------+  ABI/TBIToday's ABIToday's TBIPrevious ABIPrevious TBI +-------+-----------+-----------+------------+------------+ Right  1.15       0.50                                +-------+-----------+-----------+------------+------------+ Left   1.34       0.49                                +-------+-----------+-----------+------------+------------+   Summary: Right: Resting right ankle-brachial index is within normal range. The right toe-brachial index is abnormal. Left: Resting left ankle-brachial index is within normal range. The left toe-brachial index is abnormal. *See table(s) above for measurements and observations.  Electronically signed by Quay Burow MD on 06/24/2022 at 7:52:05 PM.    Final    Labs  Recent Labs    06/26/22 1505  WBC 9.8  HGB 12.5  HCT 38.6  PLT 255   Recent Labs    06/26/22 1505  NA 134*  K 3.9  CL 96*  CO2 24  BUN 21  CREATININE 0.82  GLUCOSE 104*  CALCIUM 8.9   Lab Results  Component Value Date   INR 1.2 06/09/2020   INR 1.1 02/18/2020   INR 1.0 12/02/2019        Physical Examination  Patient is a 89 y.o. year old female who is chronically ill appearing, mood is calm.  Orientation: oriented to person, place, time, and general circumstances  Vital Signs: BP 122/87   Pulse 91   Temp (!) 97.2 F (36.2 C) (Temporal)   Resp 17   Ht 5\' 5"  (1.651 m)   Wt 68 kg   SpO2 99%   BMI 24.96 kg/m    Gait: Unable to stand or ambulate with current injury.  Supine on stretcher.  Heart: Normal rate Lungs: Non-labored breathing Abdomen: Soft, Non-tender   Right Upper Extremity: Inspection: Atraumatic Palpation: Nontender ROM: Full, painless Strength: Normal Sensation: Intact to light touch distally Skin: Intact Peripheral Vascular: Well perfused Joint Stability: No instability Reflexes: No  pathologic Lymph Nodes: None Palpable Coordination: Intact, normal   Left Upper Extremity: Inspection: Atraumatic Palpation: Nontender ROM: Full, painless Strength: Normal Sensation: Intact to light touch distally Skin: Intact Peripheral Vascular: Well perfused Joint Stability: No instability Reflexes: No pathologic Lymph Nodes: None Palpable Coordination: Intact, normal    Right Lower Extremity: Inspection: Swelling of lower leg surrounding site of injury Palpation: Tenderness to palpation at the site of the fracture, with compartments soft and compressible ROM: Limited by pain and injury Strength: Able to demonstrate intact dorsiflexion, plantarflexion, and EHL Sensation: Intact light touch distally in superficial peroneal, deep femoral, and tibial distributions Skin: Intact Peripheral Vascular: 2+ DP, warm well-perfused distally Joint Stability: Unable to assess Reflexes: No pathologic Lymph Nodes: None Palpable Coordination: Limited by pain and injury   Left Lower Extremity: Inspection: Atraumatic Palpation: Nontender ROM: Full, painless Strength: Normal Sensation: Intact to light touch distally Skin: Intact Peripheral Vascular: Well perfused Joint Stability: No instability Reflexes: No pathologic Lymph Nodes: None Palpable Coordination: Intact, normal    Pelvis: Skin: Intact Palpation: Nontender Stability: No instability      The review of the patient's medications does not in any way constitute an endorsement, by this clinician,  of their use, dosage, indications, route, efficacy, interactions, or other clinical parameters.  This note was generated within the [employer] EMR using Dragon medical speech recognition software and  may contain inherent errors or omissions not intended by the user. Grammatical and punctuation errors, random word insertions, deletions, pronoun errors and incomplete sentences are occasional consequences of this technology due to software  limitations. Not all errors are caught or corrected.  Although every attempt is made to root out erroneus and incomplete transcription, the note may still not fully represent the intent or opinion of the author. If there are questions or concerns about the content of this note or information contained within the body of this dictation they should be addressed directly with the author for clarification.

## 2022-06-26 NOTE — Progress Notes (Addendum)
Orthopedic Tech Progress Note Patient Details:  [name] 1930/12/23 [id number]  Level II trauma. Posterior long leg splint with side struts applied to RLE in best obtainable fashion/position due to pt condition/discomfort and movement of her leg while splinting. I checked that there are no raw edges of fiberglass exposed to her skin. Motion and sensation of all toes remain intact.   Ortho Devices Type of Ortho Device: Stirrup splint, Post (long leg) splint Ortho Device/Splint Location: RLE Ortho Device/Splint Interventions: Ordered, Application, Adjustment   Post Interventions Patient Tolerated: Poor Instructions Provided: Care of device  [name] Jeri Modena 06/26/2022, 6:05 PM

## 2022-06-27 ENCOUNTER — Inpatient Hospital Stay (HOSPITAL_COMMUNITY): Payer: Medicare Other

## 2022-06-27 DIAGNOSIS — I5042 Chronic combined systolic (congestive) and diastolic (congestive) heart failure: Secondary | ICD-10-CM | POA: Diagnosis not present

## 2022-06-27 DIAGNOSIS — F419 Anxiety disorder, unspecified: Secondary | ICD-10-CM | POA: Diagnosis not present

## 2022-06-27 DIAGNOSIS — I4811 Longstanding persistent atrial fibrillation: Secondary | ICD-10-CM | POA: Diagnosis not present

## 2022-06-27 DIAGNOSIS — I4891 Unspecified atrial fibrillation: Secondary | ICD-10-CM

## 2022-06-27 DIAGNOSIS — Z01810 Encounter for preprocedural cardiovascular examination: Secondary | ICD-10-CM

## 2022-06-27 DIAGNOSIS — S82201A Unspecified fracture of shaft of right tibia, initial encounter for closed fracture: Secondary | ICD-10-CM | POA: Diagnosis not present

## 2022-06-27 LAB — CBC
HCT: 35.9 % — ABNORMAL LOW (ref 36.0–46.0)
Hemoglobin: 11.7 g/dL — ABNORMAL LOW (ref 12.0–15.0)
MCH: 30.4 pg (ref 26.0–34.0)
MCHC: 32.6 g/dL (ref 30.0–36.0)
MCV: 93.2 fL (ref 80.0–100.0)
Platelets: 248 10*3/uL (ref 150–400)
RBC: 3.85 MIL/uL — ABNORMAL LOW (ref 3.87–5.11)
RDW: 12.5 % (ref 11.5–15.5)
WBC: 9.1 10*3/uL (ref 4.0–10.5)
nRBC: 0 % (ref 0.0–0.2)

## 2022-06-27 LAB — BASIC METABOLIC PANEL
Anion gap: 7 (ref 5–15)
BUN: 21 mg/dL (ref 8–23)
CO2: 28 mmol/L (ref 22–32)
Calcium: 9 mg/dL (ref 8.9–10.3)
Chloride: 100 mmol/L (ref 98–111)
Creatinine, Ser: 0.86 mg/dL (ref 0.44–1.00)
GFR, Estimated: >60 mL/min (ref >60)
Glucose, Bld: 133 mg/dL — ABNORMAL HIGH (ref 70–99)
Potassium: 4.5 mmol/L (ref 3.5–5.1)
Sodium: 135 mmol/L (ref 135–145)

## 2022-06-27 MED ORDER — FENTANYL CITRATE (PF) 250 MCG/5ML IJ SOLN
INTRAMUSCULAR | Status: AC
  Filled 2022-06-27: qty 5

## 2022-06-27 MED ORDER — AMIODARONE LOAD VIA INFUSION
150.0000 mg | Freq: Once | INTRAVENOUS | Status: AC
  Administered 2022-06-27: 150 mg via INTRAVENOUS
  Filled 2022-06-27: qty 83.34

## 2022-06-27 MED ORDER — CHLORHEXIDINE GLUCONATE 0.12 % MT SOLN
OROMUCOSAL | Status: AC
  Filled 2022-06-27: qty 15

## 2022-06-27 MED ORDER — PROPOFOL 10 MG/ML IV BOLUS
INTRAVENOUS | Status: AC
  Filled 2022-06-27: qty 20

## 2022-06-27 MED ORDER — AMIODARONE HCL IN DEXTROSE 360-4.14 MG/200ML-% IV SOLN
60.0000 mg/h | INTRAVENOUS | Status: AC
  Administered 2022-06-27 (×2): 60 mg/h via INTRAVENOUS
  Filled 2022-06-27 (×2): qty 200

## 2022-06-27 MED ORDER — TORSEMIDE 20 MG PO TABS
10.0000 mg | ORAL_TABLET | Freq: Every day | ORAL | Status: DC
  Administered 2022-06-27 – 2022-07-01 (×4): 10 mg via ORAL
  Filled 2022-06-27 (×4): qty 1

## 2022-06-27 MED ORDER — LIDOCAINE 2% (20 MG/ML) 5 ML SYRINGE
INTRAMUSCULAR | Status: AC
  Filled 2022-06-27: qty 10

## 2022-06-27 MED ORDER — PHENYLEPHRINE 80 MCG/ML (10ML) SYRINGE FOR IV PUSH (FOR BLOOD PRESSURE SUPPORT)
PREFILLED_SYRINGE | INTRAVENOUS | Status: AC
  Filled 2022-06-27: qty 10

## 2022-06-27 MED ORDER — AMIODARONE HCL IN DEXTROSE 360-4.14 MG/200ML-% IV SOLN
30.0000 mg/h | INTRAVENOUS | Status: DC
  Administered 2022-06-27 – 2022-06-29 (×4): 30 mg/h via INTRAVENOUS
  Filled 2022-06-27 (×3): qty 200

## 2022-06-27 MED ORDER — ONDANSETRON HCL 4 MG/2ML IJ SOLN
INTRAMUSCULAR | Status: AC
  Filled 2022-06-27: qty 6

## 2022-06-27 MED ORDER — ROCURONIUM BROMIDE 10 MG/ML (PF) SYRINGE
PREFILLED_SYRINGE | INTRAVENOUS | Status: AC
  Filled 2022-06-27: qty 20

## 2022-06-27 NOTE — Consult Note (Signed)
Cardiology Consultation   Patient ID: [name] MRN: [id number]; DOB: [birth year]  Admit date: 06/26/2022 Date of Consult: 06/27/2022  PCP:  [name], MD   [place] Providers Cardiologist:  Sanda Klein, MD   Patient Profile:   [name] is a 89 y.o. female with a hx of longstanding persistent atrial fibrillation, chronic anticoagulation, chronic systolic heart failure since 2019 with an EF as low as 15% in the setting of RVR during hospital stay for hip fracture, pulmonary artery arterial hypertension with a PASP 83 mm per mercury by echo April 2021, severe TR, severe biatrial enlargement, and chronic LBBB who is being seen 06/27/2022 for the evaluation of A-fib with RVR at the request of Dr. Lonny Prude.  History of Present Illness:   [name] has a longstanding history of persistent atrial fibrillation.  She was found to have an EF of 15% in the setting of A-fib with RVR during the hospital stay for hip fracture.  GDMT and rate control resulted in recovery of her EF to 25 to 30% by most recent echocardiogram in 06/2020.  She also has a history of elevated pulmonary artery hypertension with estimated systolic PAP 83 mmHg in April 2021 along with severe TR and abnormal ventricular septal flattening --> improved to systolic PAP 0000000 mmHg and moderate TR 06/2020. Severe biatrial enlargement.   Due to lower extremity pain felt may be consistent with neuropathy, LE duplex was checked and ABIs WNL.   GDMT has been limited by hypotension and side effects (did not tolerate losartan, had cough with ACEI). Frequent UTIs precludes use of SGLT2i medications. Afib has been rate controlled with metoprolol and digoxin. She is anticoagulated with eliquis. Amiodarone was tried but stopped due to side effects. Nuclear stress test in 2019 in the setting of new low EF and RVR was over-read by Dr. Sallyanne Kuster and likely not consistent with ischemia.   She contacted the office with  concern for weight gain and was seen by Dr. Phineas Inches and subsequently Dr. Sallyanne Kuster on 05/20/22. She uses torsemide PRN but at that time was taking nearly daily.  Dry weight is 149.   Unfortunately, she suffered a fall resulting in tibia/fibula fracture. She was taken to the OR but anesthesia noted her HR int eh 130-140s and surgery was canceled. Cardiology was consulted for rate control. She has received digoxin and 150 mg toprol. Amiodarone has also been started.   She reports taking 10 mg torsemide daily, although reluctantly. Her husband and daughter at bedside confirm that she had a mechanical fall. She ambulates with a walker at baseline and was try to kick a trashbag with her left foot and her right knee collapses behind her.   Family members also confirm she had her cardiac medications late (usually takes BB and digoxin in the morning). She is on supplemental O2. She is generally unaware of her rhythm but does reports significant pain. She did hit her head but denies loss of consciousness.     Past Medical History:  Diagnosis Date   Acute combined systolic and diastolic heart failure ([place]) 05/02/2017   Anemia    Anxiety    Arthritis    "knees" (06/18/2015)   Atrial fibrillation with RVR ([place]) 06/18/2015   h/o sig bleed on coumadin   Cardiogenic shock [id number]   Complication of anesthesia 2010   "w/cardiac cath; got into a psychotic state for ~ 3 days; got me out w/Valium"   Depression    "  I have it off and on; not as often as I've gotten older" (06/18/2015)   Diverticulosis    descending and sigmoid colon--Dr. Sharlett Iles   DJD (degenerative joint disease) of knee    left   Dyspnea    with exertion and in morning mostly when wakes up    E coli infection    History of   GERD (gastroesophageal reflux disease)    History of blood transfusion    History of Clostridium difficile colitis    IBS (irritable bowel syndrome)    Insomnia     off chronic ambien 5mg  as of 10/11,  rare/episodic use since then  DCM/CHF   LBBB (left bundle branch block)    Lymphocytic colitis 09/27/2016   Moderate to severe pulmonary hypertension ([place]) 11/26/2019   Nonischemic cardiomyopathy ([place])    normal coronaries, EF 15-20% 04/2008, EF 50-55% 2015   Osteoporosis    on DXA 05/2010   Positive PPD    Retroperitoneal bleed    Sciatica    Right   Urinary incontinence    Occassionally    Past Surgical History:  Procedure Laterality Date   ANTERIOR APPROACH HEMI HIP ARTHROPLASTY Left 08/05/2019   Procedure: ANTERIOR APPROACH HEMI HIP ARTHROPLASTY;  Surgeon: Dorna Leitz, MD;  Location: [facility];  Service: Orthopedics;  Laterality: Left;   BIOPSY  11/22/2017   Procedure: BIOPSY;  Surgeon: Gatha Mayer, MD;  Location: [facility];  Service: Endoscopy;;   CARDIAC CATHETERIZATION  2010   CARDIOVERSION N/A 06/19/2015   Procedure: CARDIOVERSION;  Surgeon: Larey Dresser, MD;  Location: [facility];  Service: Cardiovascular;  Laterality: N/A;   CATARACT EXTRACTION W/ INTRAOCULAR LENS  IMPLANT, BILATERAL Bilateral    COLONOSCOPY WITH PROPOFOL N/A 11/22/2017   Procedure: COLONOSCOPY WITH PROPOFOL;  Surgeon: Gatha Mayer, MD;  [facility];  Service: Endoscopy;  Laterality: N/A;   DILATION AND CURETTAGE OF UTERUS     ORIF ANKLE FRACTURE Right 09/22/2020   Procedure: RIGHT ANKLE OPEN REDUCTION INTERNAL FIXATION (ORIF);  Surgeon: Melrose Nakayama, MD;  [facility];  Service: Orthopedics;  Laterality: Right;   TEE WITHOUT CARDIOVERSION N/A 06/19/2015   Procedure: TRANSESOPHAGEAL ECHOCARDIOGRAM (TEE);  Surgeon: Larey Dresser, MD;  Location: [facility];  Service: Cardiovascular;  Laterality: N/A;   TOTAL KNEE ARTHROPLASTY Left 04/25/2017   Procedure: TOTAL KNEE ARTHROPLASTY;  Surgeon: Melrose Nakayama, MD;  Location: [facility];  Service: Orthopedics;  Laterality: Left;   TOTAL KNEE ARTHROPLASTY Right 02/18/2020   Procedure: RIGHT TOTAL KNEE ARTHROPLASTY;  Surgeon: Melrose Nakayama,  MD;  [facility];  Service: Orthopedics;  Laterality: Right;   TOTAL KNEE ARTHROPLASTY Right 06/16/2020   Procedure: RIGHT RETINACULAR REPAIR;  Surgeon: Melrose Nakayama, MD;  [facility];  Service: Orthopedics;  Laterality: Right;   TOTAL KNEE REVISION Left 12/03/2019   Procedure: LEFT TOTAL KNEE REVISION  OF POYLY EXCHANGE;  Surgeon: Melrose Nakayama, MD;  [facility];  Service: Orthopedics;  Laterality: Left;   VAGINAL HYSTERECTOMY  1979   ovaries intact     Home Medications:  Prior to Admission medications   Medication Sig Start Date End Date Taking? Authorizing Provider  acetaminophen (TYLENOL) 500 MG tablet Take 1,000 mg by mouth in the morning and at bedtime.   Yes [provider]  apixaban (ELIQUIS) 2.5 MG TABS tablet TAKE 1 TABLET BY MOUTH TWICE A DAY 04/28/22  Yes Croitoru, Mihai, MD  calcium carbonate (TUMS - DOSED IN MG ELEMENTAL CALCIUM) 500 MG chewable tablet  Chew 500 mg by mouth 2 (two) times daily as needed for indigestion or heartburn.   Yes [provider]  cholecalciferol (VITAMIN D3) 25 MCG (1000 UNIT) tablet Take 1,000 Units by mouth in the morning.   Yes [provider]  cholestyramine (QUESTRAN) 4 g packet TAKE 1 PACKET (4 G TOTAL) BY MOUTH DAILY WITH SUPPER. 04/12/22  Yes Gatha Mayer, MD  Cranberry 180 MG CAPS Take 180 mg by mouth daily in the afternoon.   Yes [provider]  digoxin (LANOXIN) 0.125 MG tablet 1/4 tab by mouth daily. Patient taking differently: Take 0.0625 mg by mouth daily. 06/17/22  Yes [name], MD  fluticasone [facility]) 50 MCG/ACT nasal spray Place 2 sprays into both nostrils daily. 11/10/21  Yes [name], MD  KLOR-CON M20 20 MEQ tablet TAKE 2 TABLETS BY MOUTH DAILY 12/20/21  Yes [name], MD  loperamide (IMODIUM) 2 MG capsule Take 2 mg by mouth 3 (three) times daily as needed for diarrhea or loose stools.    Yes [provider]  loratadine (CLARITIN) 10 MG tablet Take 10 mg  by mouth daily.   Yes [provider]  metoprolol succinate (TOPROL-XL) 100 MG 24 hr tablet Take 1.5 tablets (150 mg total) by mouth daily. 04/22/22  Yes [name], MD  pantoprazole (PROTONIX) 20 MG tablet Take 1 tablet (20 mg total) by mouth daily. 06/22/22  Yes [name], MD  torsemide (DEMADEX) 10 MG tablet Take 1 tablet (10 mg total) by mouth daily. Dry weight is 149. If you are above this, take 20mg  torsemide. 05/20/22  Yes Croitoru, Mihai, MD  traMADol (ULTRAM) 50 MG tablet Take 1 tablet (50 mg total) by mouth 3 (three) times daily as needed. Patient taking differently: Take 50 mg by mouth 3 (three) times daily as needed for moderate pain or severe pain. 06/17/22  Yes [name], MD  venlafaxine XR (EFFEXOR-XR) 37.5 MG 24 hr capsule Take 2 capsules (75 mg total) by mouth every morning. 02/03/22  Yes [name], MD    Inpatient Medications: Scheduled Meds:  chlorhexidine       chlorhexidine       digoxin  0.0625 mg Oral Daily   metoprolol succinate  150 mg Oral Daily    morphine injection  2 mg Intravenous Once   pantoprazole  20 mg Oral Daily   venlafaxine XR  75 mg Oral q morning   Continuous Infusions:  amiodarone 60 mg/hr (06/27/22 1404)   Followed by   amiodarone     PRN Meds: chlorhexidine, chlorhexidine, HYDROcodone-acetaminophen, morphine injection  Allergies:    Allergies  Allergen Reactions   Ace Inhibitors Cough   Warfarin Sodium Other (See Comments)    DOSE RELATED PHARMACOLOGIC EFFECT "bleed out"   Famotidine Other (See Comments)    GI upset   Vancomycin Nausea Only   Codeine Other (See Comments)    sedation   Delsym [Dextromethorphan Polistirex Er] Other (See Comments)    dizziness   Lactose Intolerance (Gi) Diarrhea   Lasix [Furosemide] Diarrhea    Oral Lasix gives her diarrhea, tolerates IV Rx   Phenylephrine Palpitations and Other (See Comments)    Nasal spray- "likely increase in nasal congestion".     Sulfamethoxazole-Trimethoprim Nausea And Vomiting    GI intolerance.    Social History:   Social History   Socioeconomic History   Marital status: Married    Spouse name: Sonia Side    Number of children:  2   Years of education: 110   Highest education level: Not on file  Occupational History   Occupation: Retired    Fish farm manager: RETIRED  Tobacco Use   Smoking status: Never   Smokeless tobacco: Never  Vaping Use   Vaping Use: Never used  Substance and Sexual Activity   Alcohol use: No    Alcohol/week: 0.0 standard drinks of alcohol   Drug use: No   Sexual activity: Not Currently    Birth control/protection: Post-menopausal, Surgical    Comment: Hysterectomy  Other Topics Concern   Not on file  Social History Narrative   Retired, former [employer]   Married, [place]   Lives with husband   2 grown children, one in [place], one out of state   Tobacco Use - No.    Drug Use - no   teaches Sunday school, reading   Social Determinants of Health   Financial Resource Strain: [place]  (07/12/2021)   Overall Financial Resource Strain (CARDIA)    Difficulty of Paying Living Expenses: Not hard at all  Food Insecurity: No Food Insecurity (07/12/2021)   Hunger Vital Sign    Worried About Running Out of Food in the Last Year: Never true    [place] in the Last Year: Never true  Transportation Needs: No Transportation Needs (07/12/2021)   PRAPARE - Hydrologist (Medical): No    Lack of Transportation (Non-Medical): No  Physical Activity: Insufficiently Active (07/12/2021)   Exercise Vital Sign    Days of Exercise per Week: 5 days    Minutes of Exercise per Session: 20 min  Stress: Stress Concern Present (07/12/2021)   [place]    Feeling of Stress : To some extent  Social Connections: Socially Integrated (07/12/2021)   Social Connection and Isolation Panel [NHANES]    Frequency of  Communication with Friends and Family: More than three times a week    Frequency of Social Gatherings with Friends and Family: More than three times a week    Attends Religious Services: 1 to 4 times per year    Active Member of [employer] or Organizations: Yes    Attends Archivist Meetings: 1 to 4 times per year    Marital Status: Married  Human resources officer Violence: Not At Risk (07/12/2021)   Humiliation, Afraid, Rape, and Kick questionnaire    Fear of Current or Ex-Partner: No    Emotionally Abused: No    Physically Abused: No    Sexually Abused: No    Family History:    Family History  Problem Relation Age of Onset   Colon cancer Father        possible colon cancer   Other Father        esophagus tumor?   Tuberculosis Mother    Breast cancer Neg Hx    Stomach cancer Neg Hx    Pancreatic cancer Neg Hx      ROS:  Please see the history of present illness.   All other ROS reviewed and negative.     Physical Exam/Data:   Vitals:   06/27/22 1349 06/27/22 1351 06/27/22 1400 06/27/22 1421  BP: (!) 121/56 107/65  95/79  Pulse:  91 (!) 106   Resp: 20 13 20 17   Temp:      TempSrc:      SpO2: 97% 92% 98% 98%  Weight:      Height:  Intake/Output Summary (Last 24 hours) at 06/27/2022 1509 Last data filed at 06/27/2022 1404 Gross per 24 hour  Intake 93.79 ml  Output 200 ml  Net -106.21 ml      06/26/2022    3:23 PM 06/17/2022   12:22 PM 05/20/2022    1:30 PM  Last 3 Weights  Weight (lbs) 150 lb 153 lb 152 lb 3.2 oz  Weight (kg) 68.04 kg 69.4 kg 69.037 kg     Body mass index is 24.96 kg/m.  General:  Well nourished, well developed, in no acute distress HEENT: normal Neck: no JVD Vascular: No carotid bruits; Distal pulses 2+ bilaterally Cardiac:  irregular rhythm, tachycardic rate Lungs:  crackles in bases  Abd: soft, nontender, no hepatomegaly  Ext: no edema, right leg wrapped Musculoskeletal:  No deformities, BUE and BLE strength normal and equal Skin:  warm and dry  Neuro:  CNs 2-12 intact, no focal abnormalities noted Psych:  Normal affect   EKG:  The EKG was personally reviewed and demonstrates:  atrial fibrillation with ventricular rate of 103 with LBBB --> atrial fibrillation with ventricular rate 135 with LBBB, right axis  deviation Telemetry:  Telemetry was personally reviewed and demonstrates:  Afib with rates 90-120s  Relevant CV Studies:  Echo pending  Echo 06/2020: 1. Left ventricular ejection fraction, by estimation, is 25 to 30%. Left  ventricular ejection fraction by 3D volume is 30 %. Left ventricular  ejection fraction by 2D MOD biplane is 26.8 %. The left ventricle has  severely decreased function. The left  ventricle demonstrates global hypokinesis. Left ventricular diastolic  function could not be evaluated. The average left ventricular global  longitudinal strain is -7.9 %. The global longitudinal strain is abnormal.   2. Right ventricular systolic function is mildly reduced. The right  ventricular size is mildly enlarged. There is mildly elevated pulmonary  artery systolic pressure. The estimated right ventricular systolic  pressure is 0000000 mmHg.   3. Left atrial size was severely dilated.   4. Right atrial size was severely dilated.   5. The mitral valve is grossly normal. Mild mitral valve regurgitation.  No evidence of mitral stenosis.   6. Tricuspid valve regurgitation is moderate.   7. The aortic valve is tricuspid. There is mild calcification of the  aortic valve. There is mild thickening of the aortic valve. Aortic valve  regurgitation is mild. Mild aortic valve sclerosis is present, with no  evidence of aortic valve stenosis.   8. The inferior vena cava is normal in size with greater than 50%  respiratory variability, suggesting right atrial pressure of 3 mmHg.   Laboratory Data:  High Sensitivity Troponin:  No results for input(s): "TROPONINIHS" in the last 720 hours.   Chemistry Recent Labs  Lab  06/26/22 1505 06/27/22 0454  NA 134* 135  K 3.9 4.5  CL 96* 100  CO2 24 28  GLUCOSE 104* 133*  BUN 21 21  CREATININE 0.82 0.86  CALCIUM 8.9 9.0  MG 1.9  --   GFRNONAA >60 >60  ANIONGAP 14 7    No results for input(s): "PROT", "ALBUMIN", "AST", "ALT", "ALKPHOS", "BILITOT" in the last 168 hours. Lipids No results for input(s): "CHOL", "TRIG", "HDL", "LABVLDL", "LDLCALC", "CHOLHDL" in the last 168 hours.  Hematology Recent Labs  Lab 06/26/22 1505 06/27/22 0454  WBC 9.8 9.1  RBC 4.05 3.85*  HGB 12.5 11.7*  HCT 38.6 35.9*  MCV 95.3 93.2  MCH 30.9 30.4  MCHC 32.4 32.6  RDW 12.3  12.5  PLT 255 248   Thyroid No results for input(s): "TSH", "FREET4" in the last 168 hours.  BNP Recent Labs  Lab 06/26/22 1505  BNP 490.6*    DDimer No results for input(s): "DDIMER" in the last 168 hours.   Radiology/Studies:  DG Ankle Right Port  Result Date: 06/26/2022 CLINICAL DATA:  Fall with leg pain. EXAM: PORTABLE RIGHT ANKLE - 2 VIEW COMPARISON:  Report from right foot radiographs dated 02/07/2000. FINDINGS: No acute fracture around the ankle. No joint dislocation. Fixation hardware is seen in the distal fibula and in the medial malleolus. No significant soft tissue swelling around the ankle. IMPRESSION: 1. No acute osseous injury around the ankle. Electronically Signed   By: Zerita Boers M.D.   On: 06/26/2022 16:27   DG Knee 1-2 Views Right  Result Date: 06/26/2022 CLINICAL DATA:  Fall with leg pain. EXAM: RIGHT KNEE - 1-2 VIEW COMPARISON:  None Available. FINDINGS: There is a comminuted fracture of the proximal shaft of the tibia. There is a segmental fracture of the proximal shaft of the fibula and the fibular neck. The patient is status post total knee replacement. There is no knee joint dislocation or knee joint effusion. There is soft tissue swelling surrounding the leg. IMPRESSION: 1. Comminuted fracture of the proximal shaft of the tibia. 2. Segmental fracture of the proximal shaft  of the fibula and fibular neck. Electronically Signed   By: Zerita Boers M.D.   On: 06/26/2022 16:27   DG Tibia/Fibula Right  Result Date: 06/26/2022 CLINICAL DATA:  Fall with leg pain. EXAM: RIGHT TIBIA AND FIBULA - 2 VIEW COMPARISON:  None Available. FINDINGS: There is a comminuted fracture of the proximal shaft of the tibia. There is a segmental fracture of the proximal shaft of the fibula and the fibular neck. The patient is status post total knee replacement. Fixation hardware is seen in the distal fibula and in the medial malleolus. There is soft tissue swelling surrounding the leg. IMPRESSION: 1. Comminuted fracture of the proximal shaft of the tibia. 2. Segmental fracture of the proximal shaft of the fibula and fibular neck. Electronically Signed   By: Zerita Boers M.D.   On: 06/26/2022 16:25   CT Head Wo Contrast  Result Date: 06/26/2022 CLINICAL DATA:  Head and neck trauma. EXAM: CT HEAD WITHOUT CONTRAST CT CERVICAL SPINE WITHOUT CONTRAST TECHNIQUE: Multidetector CT imaging of the head and cervical spine was performed following the standard protocol without intravenous contrast. Multiplanar CT image reconstructions of the cervical spine were also generated. RADIATION DOSE REDUCTION: This exam was performed according to the departmental dose-optimization program which includes automated exposure control, adjustment of the mA and/or kV according to patient size and/or use of iterative reconstruction technique. COMPARISON:  Head CT 05/07/2008 FINDINGS: CT HEAD FINDINGS Brain: Ventricles, cisterns and other CSF spaces are normal. There is chronic ischemic microvascular disease. No mass, mass effect, shift of midline structures or acute hemorrhage. No evidence of acute infarction. Vascular: No hyperdense vessel or unexpected calcification. Skull: Normal. Negative for fracture or focal lesion. Sinuses/Orbits: No acute finding. Other: None. CT CERVICAL SPINE FINDINGS Alignment: No posttraumatic  subluxation. Skull base and vertebrae: Vertebral body heights are maintained. There is mild moderate spondylosis throughout the cervical spine to include uncovertebral joint spurring and facet arthropathy. Mild bilateral neural foraminal narrowing at the C4-5 level with right-sided neural foraminal narrowing at the C5-6 level. Moderate right-sided neural foraminal narrowing at the C6-7 level. No acute fracture. Soft tissues and spinal  canal: No prevertebral fluid or swelling. No visible canal hematoma. Disc levels: Moderate disc space narrowing at the C4-5, C5-6 and C6-7 levels. Upper chest: No acute findings. Other: None. IMPRESSION: 1. No acute brain injury. 2. Chronic ischemic microvascular disease. 3. No acute cervical spine injury. 4. Mild-to-moderate spondylosis throughout the cervical spine with disc disease at the C4-5, C5-6 and C6-7 levels. Neural foraminal narrowing as described. Electronically Signed   By: Marin Olp M.D.   On: 06/26/2022 16:24   CT Cervical Spine Wo Contrast  Result Date: 06/26/2022 CLINICAL DATA:  Head and neck trauma. EXAM: CT HEAD WITHOUT CONTRAST CT CERVICAL SPINE WITHOUT CONTRAST TECHNIQUE: Multidetector CT imaging of the head and cervical spine was performed following the standard protocol without intravenous contrast. Multiplanar CT image reconstructions of the cervical spine were also generated. RADIATION DOSE REDUCTION: This exam was performed according to the departmental dose-optimization program which includes automated exposure control, adjustment of the mA and/or kV according to patient size and/or use of iterative reconstruction technique. COMPARISON:  Head CT 05/07/2008 FINDINGS: CT HEAD FINDINGS Brain: Ventricles, cisterns and other CSF spaces are normal. There is chronic ischemic microvascular disease. No mass, mass effect, shift of midline structures or acute hemorrhage. No evidence of acute infarction. Vascular: No hyperdense vessel or unexpected calcification.  Skull: Normal. Negative for fracture or focal lesion. Sinuses/Orbits: No acute finding. Other: None. CT CERVICAL SPINE FINDINGS Alignment: No posttraumatic subluxation. Skull base and vertebrae: Vertebral body heights are maintained. There is mild moderate spondylosis throughout the cervical spine to include uncovertebral joint spurring and facet arthropathy. Mild bilateral neural foraminal narrowing at the C4-5 level with right-sided neural foraminal narrowing at the C5-6 level. Moderate right-sided neural foraminal narrowing at the C6-7 level. No acute fracture. Soft tissues and spinal canal: No prevertebral fluid or swelling. No visible canal hematoma. Disc levels: Moderate disc space narrowing at the C4-5, C5-6 and C6-7 levels. Upper chest: No acute findings. Other: None. IMPRESSION: 1. No acute brain injury. 2. Chronic ischemic microvascular disease. 3. No acute cervical spine injury. 4. Mild-to-moderate spondylosis throughout the cervical spine with disc disease at the C4-5, C5-6 and C6-7 levels. Neural foraminal narrowing as described. Electronically Signed   By: Marin Olp M.D.   On: 06/26/2022 16:24   DG Chest Portable 1 View  Result Date: 06/26/2022 CLINICAL DATA:  Fall from standing. EXAM: PORTABLE CHEST 1 VIEW COMPARISON:  Chest radiograph dated 06/23/2020. FINDINGS: The heart is enlarged. Vascular calcifications are seen in the aortic arch. The lungs are clear. There is no pleural effusion or pneumothorax. Emphysematous changes are noted. Degenerative changes are seen in the spine. IMPRESSION: 1. No active disease. 2. Cardiomegaly. Electronically Signed   By: Zerita Boers M.D.   On: 06/26/2022 16:22   DG Pelvis Portable  Result Date: 06/26/2022 CLINICAL DATA:  Fall EXAM: PORTABLE PELVIS 1-2 VIEWS COMPARISON:  08/05/2019, 08/03/2019 FINDINGS: SI joints are non widened. Pubic symphysis and rami appear intact. Status post left hip replacement with intact hardware and normal alignment. No  definitive fracture is seen IMPRESSION: Status post left hip replacement. No acute osseous abnormality. Electronically Signed   By: Donavan Foil M.D.   On: 06/26/2022 16:22   VAS [place] LOWER EXT ART SEG MULTI (SEGMENTALS & LE RAYNAUDS)  Result Date: 06/24/2022  LOWER EXTREMITY DOPPLER STUDY Patient Name:  JASMINEMARIE BOGUS  Date of Exam:   06/24/2022 Medical Rec #: [id number]        Accession #:    [id number] Date  of Birth: [birth year]        Patient Gender: F Patient Age:   12 years Exam Location:  [facility] Procedure:      VAS [place] LOWER EXT ART SEG MULTI (SEGMENTALS & LE RAYNAUDS) Referring Phys: [id number] ARIDA --------------------------------------------------------------------------------  Indications: Patient reports for the last 2-3 months having intermittent pain in              her right heel and toes that is worse at night. The pain is better              with walking. The pain has improved in the last two to three days              with cream provided by the doctor. Patient reports blue and black              spots on her great toe and fourth toe that comes and goes. [place]              denies any ture claudication symptoms. Patient does have one small              eraser sized black discoloration on right great toe. High Risk Factors: No history of smoking, prior MI. Other Factors: Patient has screws in her right ankle from a broken ankle two                years ago. AFIB.  Comparison Study: NA Performing Technologist: Leavy Cella RDCS  Examination Guidelines: A complete evaluation includes at minimum, Doppler waveform signals and systolic blood pressure reading at the level of bilateral brachial, anterior tibial, and posterior tibial arteries, when vessel segments are accessible. Bilateral testing is considered an integral part of a complete examination. Photoelectric Plethysmograph (PPG) waveforms and toe systolic pressure readings are included as required and additional duplex testing as needed.  Limited examinations for reoccurring indications may be performed as noted.  ABI Findings: +---------+------------------+-----+---------+--------+ Right    Rt Pressure (mmHg)IndexWaveform Comment  +---------+------------------+-----+---------+--------+ Brachial 105                                      +---------+------------------+-----+---------+--------+ CFA                             triphasic         +---------+------------------+-----+---------+--------+ Popliteal                       triphasic         +---------+------------------+-----+---------+--------+ PTA      130               1.15 triphasic         +---------+------------------+-----+---------+--------+ PERO     116               1.03 biphasic          +---------+------------------+-----+---------+--------+ DP       106               0.94 triphasic         +---------+------------------+-----+---------+--------+ Great Toe57                0.50 Abnormal          +---------+------------------+-----+---------+--------+ +---------+------------------+-----+---------+-------+ Left     Lt Pressure (mmHg)IndexWaveform Comment +---------+------------------+-----+---------+-------+ Brachial 113                                     +---------+------------------+-----+---------+-------+  CFA                             triphasic        +---------+------------------+-----+---------+-------+ Popliteal                       triphasic        +---------+------------------+-----+---------+-------+ PTA      151               1.34 triphasic        +---------+------------------+-----+---------+-------+ PERO     136               1.30 triphasic        +---------+------------------+-----+---------+-------+ DP       130               1.15 triphasic        +---------+------------------+-----+---------+-------+ Great Toe55                0.49 Abnormal          +---------+------------------+-----+---------+-------+ +-------+-----------+-----------+------------+------------+ ABI/TBIToday's ABIToday's TBIPrevious ABIPrevious TBI +-------+-----------+-----------+------------+------------+ Right  1.15       0.50                                +-------+-----------+-----------+------------+------------+ Left   1.34       0.49                                +-------+-----------+-----------+------------+------------+   Summary: Right: Resting right ankle-brachial index is within normal range. The right toe-brachial index is abnormal. Left: Resting left ankle-brachial index is within normal range. The left toe-brachial index is abnormal. *See table(s) above for measurements and observations.  Electronically signed by Quay Burow MD on 06/24/2022 at 7:52:05 PM.    Final      Assessment and Plan:   Atrial fibrillation with RVR Persistent atrial fibrillation Chronic LBBB Typically rate controlled with digoxin and 150 mg toprol Previously did not tolerate amiodarone due to side effects -seemingly tolerating IV amiodarone, although nauseous - unclear if this is from the amiodarone or pain - given her pain, may need to tolerate rates up to 120s until recovered from surgery - can also add an additional 50 mg toprol   Chronic anticoagulation Maintained on 2.5 mg eliquis BID  Has been held in anticipation for surgery If surgery prolonged, would consider starting heparin gtt   Chronic systolic heart failure Pulmonary hypertension, TR Likely related to RVR EF 15% in the setting of RVR, improved with rate control to 25-30% GDMT has been limited by BP and side effects Repeat echo pending - but prefer this be obtained once she is better rate controlled - mildly volume up with crackles in bases - would give 10 mg torsemide today   Mechanical fall Tibia/fibula fracture Pending surgery   Preoperative risk evaluation As above, prior nuclear  stress test findings felt related to LBBB. Has never had ischemic heart disease but does have a significant history of heart failure with a narrow window for diuretic dose. Would recommend 10 mg torsemide today to avoid CHF exacerbation given that she is receiving fluids. She understand that she has 0.9% risk according to the RCRI. Would keep her optimized from a fluid standpoint.    Risk Assessment/Risk Scores:  New York Heart Association (NYHA) Functional Class NYHA Class III  CHA2DS2-VASc Score = 5   This indicates a 7.2% annual risk of stroke. The patient's score is based upon: CHF History: 1 HTN History: 1 Diabetes History: 0 Stroke History: 0 Vascular Disease History: 0 Age Score: 2 Gender Score: 1    For questions or updates, please contact [place] Please consult [email or web address] for contact info under    Signed, Ledora Bottcher, [place]  06/27/2022 3:09 PM

## 2022-06-27 NOTE — Plan of Care (Signed)

## 2022-06-27 NOTE — Progress Notes (Signed)
Pt seen in the holding room with HR in the 130's and 140's. Patients surgery will be cancelled due to poor HR control. Plan to proceed with surgery tomorrow if HR controlled.

## 2022-06-27 NOTE — Progress Notes (Signed)
Page Md with concern about pt. Heart elevated in 130's, was orders to give scheduled  metopriolol

## 2022-06-27 NOTE — Progress Notes (Addendum)
MD page due to pt. Returned to floor due to reported elevated heart rate, EKG taken, MD replied at bedside. New orders placed

## 2022-06-27 NOTE — Progress Notes (Signed)
Pt arrived to Short Stay from 5N unit.  Heart rate  120'a to 140's.  B/P 108/70. O2 Sats 97 on 2L O2.  Metoprolol 150mg  gven at 1202 prior to transport from floor. Dr Ola Spurr Notified.

## 2022-06-27 NOTE — Progress Notes (Addendum)
PROGRESS NOTE    Chanele Pretzer Heemstra  [id number] DOB: 10-18-1930 DOA: 06/26/2022 PCP: [name], MD   Brief Narrative: CRYSTINE ZOBRIST is a 89 y.o. female with a history of atrial fibrillation, HFrEF, anxiety, depression, GERD. Patient presented after falling and suffering a tibia/fibula fracture. Orthopedic surgery consulted.   Assessment and Plan:  Right tibial/fibula fracture Secondary to losing balance while using her walker and falling. X-rays on admission significant for comminuted fracture of the proximal shaft of the tibia and segmental fracture of proximal shaft of the fibula/fibular neck. Orthopedic surgery consulted with recommendation for surgical management. -NPO -Analgesic therapy -Orthopedic surgery recommendations: pending today  Right foot pain Patient with a history of neuropathy but pain is worse with recent fall -Foot x-ray  Persistent atrial fibrillation with RVR RVR secondary to patient not receiving home beta-blocker. Hemodynamically stable. Eliquis held on admission secondary to plan for surgery -Give home metoprolol -Continue home digoxin  Addendum: Patient with worsening heart rate up to 150-160s. Blood pressure stable. Patient asymptomatic. Hx of HFrEF. Transfer to progressive care unit and will start amiodarone drip with bolus and consult cardiology for recommendations and management. Plan discussed with patient at bedside, in addition to patient's husband, daughter and pastor outside the patient room.  Chronic combined systolic and diastolic heart failure Last LVEF of 25-30% from Transthoracic Echocardiogram in 06/2020. No fluid overload noted. -Continue strict in/out and daily weights -Continue torsemide  Primary hypertension -Continue metoprolol  Hyponatremia Mild. Resolved.  PAD Noted. Patient follows with Cardiology as an outpatient.  Anxiety Depression -Continue venlafaxine  DVT prophylaxis: SCDs Code Status:   Code Status:  Full Code Family Communication: Husband of 54 years at bedside Disposition Plan: Discharge home vs SNF pending orthopedic surgery recommendations/management and eventual PT/OT eval/recommendations. Anticipate discharge no earlier than 3/21   Consultants:  Orthopedic surgery  Procedures:  None  Antimicrobials: None    Subjective: Patient reports intermittent pain in her right foot that she attributes to neuropathy in addition to the muscle pulling on her bone. She also reports some dry mouth. No other concerns this morning.  Objective: BP (!) 112/50 (BP Location: Right Arm)   Pulse 93   Temp (!) 97.5 F (36.4 C) (Oral)   Resp 20   Ht 5\' 5"  (1.651 m)   Wt 68 kg   SpO2 98%   BMI 24.96 kg/m   Examination:  General exam: Appears calm and comfortable Respiratory system: Clear to auscultation. Respiratory effort normal. Cardiovascular system: S1 & S2 heard. Tachycardia. Gastrointestinal system: Abdomen is nondistended, soft and nontender. Normal bowel sounds heard. Central nervous system: Alert and oriented. No focal neurological deficits. Musculoskeletal: No edema. Right leg in splint. No calf tenderness Skin: No cyanosis. No rashes Psychiatry: Judgement and insight appear normal. Mood & affect appropriate.    Data Reviewed: I have personally reviewed following labs and imaging studies  CBC Lab Results  Component Value Date   WBC 9.1 06/27/2022   RBC 3.85 (L) 06/27/2022   HGB 11.7 (L) 06/27/2022   HCT 35.9 (L) 06/27/2022   MCV 93.2 06/27/2022   MCH 30.4 06/27/2022   PLT 248 06/27/2022   MCHC 32.6 06/27/2022   RDW 12.5 06/27/2022   LYMPHSABS 1.7 06/26/2022   MONOABS 0.7 06/26/2022   EOSABS 0.3 06/26/2022   BASOSABS 0.0 [id number]     Last metabolic panel Lab Results  Component Value Date   NA 135 06/27/2022   K 4.5 06/27/2022   CL 100 06/27/2022  CO2 28 06/27/2022   BUN 21 06/27/2022   CREATININE 0.86 06/27/2022   GLUCOSE 133 (H) 06/27/2022    GFRNONAA >60 06/27/2022   GFRAA 83 05/28/2020   CALCIUM 9.0 06/27/2022   PHOS 3.6 04/30/2017   PROT 6.6 04/22/2022   ALBUMIN 4.0 04/22/2022   LABGLOB 2.3 02/05/2018   AGRATIO 1.7 02/05/2018   BILITOT 0.7 04/22/2022   ALKPHOS 83 04/22/2022   AST 20 04/22/2022   ALT 11 04/22/2022   ANIONGAP 7 06/27/2022    GFR: Estimated Creatinine Clearance: 39.9 mL/min (by C-G formula based on SCr of 0.86 mg/dL).  No results found for this or any previous visit (from the past 240 hour(s)).    Radiology Studies: DG Ankle Right Port  Result Date: 06/26/2022 CLINICAL DATA:  Fall with leg pain. EXAM: PORTABLE RIGHT ANKLE - 2 VIEW COMPARISON:  Report from right foot radiographs dated 02/07/2000. FINDINGS: No acute fracture around the ankle. No joint dislocation. Fixation hardware is seen in the distal fibula and in the medial malleolus. No significant soft tissue swelling around the ankle. IMPRESSION: 1. No acute osseous injury around the ankle. Electronically Signed   By: Zerita Boers M.D.   On: 06/26/2022 16:27   DG Knee 1-2 Views Right  Result Date: 06/26/2022 CLINICAL DATA:  Fall with leg pain. EXAM: RIGHT KNEE - 1-2 VIEW COMPARISON:  None Available. FINDINGS: There is a comminuted fracture of the proximal shaft of the tibia. There is a segmental fracture of the proximal shaft of the fibula and the fibular neck. The patient is status post total knee replacement. There is no knee joint dislocation or knee joint effusion. There is soft tissue swelling surrounding the leg. IMPRESSION: 1. Comminuted fracture of the proximal shaft of the tibia. 2. Segmental fracture of the proximal shaft of the fibula and fibular neck. Electronically Signed   By: Zerita Boers M.D.   On: 06/26/2022 16:27   DG Tibia/Fibula Right  Result Date: 06/26/2022 CLINICAL DATA:  Fall with leg pain. EXAM: RIGHT TIBIA AND FIBULA - 2 VIEW COMPARISON:  None Available. FINDINGS: There is a comminuted fracture of the proximal shaft of the  tibia. There is a segmental fracture of the proximal shaft of the fibula and the fibular neck. The patient is status post total knee replacement. Fixation hardware is seen in the distal fibula and in the medial malleolus. There is soft tissue swelling surrounding the leg. IMPRESSION: 1. Comminuted fracture of the proximal shaft of the tibia. 2. Segmental fracture of the proximal shaft of the fibula and fibular neck. Electronically Signed   By: Zerita Boers M.D.   On: 06/26/2022 16:25   CT Head Wo Contrast  Result Date: 06/26/2022 CLINICAL DATA:  Head and neck trauma. EXAM: CT HEAD WITHOUT CONTRAST CT CERVICAL SPINE WITHOUT CONTRAST TECHNIQUE: Multidetector CT imaging of the head and cervical spine was performed following the standard protocol without intravenous contrast. Multiplanar CT image reconstructions of the cervical spine were also generated. RADIATION DOSE REDUCTION: This exam was performed according to the departmental dose-optimization program which includes automated exposure control, adjustment of the mA and/or kV according to patient size and/or use of iterative reconstruction technique. COMPARISON:  Head CT 05/07/2008 FINDINGS: CT HEAD FINDINGS Brain: Ventricles, cisterns and other CSF spaces are normal. There is chronic ischemic microvascular disease. No mass, mass effect, shift of midline structures or acute hemorrhage. No evidence of acute infarction. Vascular: No hyperdense vessel or unexpected calcification. Skull: Normal. Negative for fracture or focal lesion.  Sinuses/Orbits: No acute finding. Other: None. CT CERVICAL SPINE FINDINGS Alignment: No posttraumatic subluxation. Skull base and vertebrae: Vertebral body heights are maintained. There is mild moderate spondylosis throughout the cervical spine to include uncovertebral joint spurring and facet arthropathy. Mild bilateral neural foraminal narrowing at the C4-5 level with right-sided neural foraminal narrowing at the C5-6 level.  Moderate right-sided neural foraminal narrowing at the C6-7 level. No acute fracture. Soft tissues and spinal canal: No prevertebral fluid or swelling. No visible canal hematoma. Disc levels: Moderate disc space narrowing at the C4-5, C5-6 and C6-7 levels. Upper chest: No acute findings. Other: None. IMPRESSION: 1. No acute brain injury. 2. Chronic ischemic microvascular disease. 3. No acute cervical spine injury. 4. Mild-to-moderate spondylosis throughout the cervical spine with disc disease at the C4-5, C5-6 and C6-7 levels. Neural foraminal narrowing as described. Electronically Signed   By: Marin Olp M.D.   On: 06/26/2022 16:24   CT Cervical Spine Wo Contrast  Result Date: 06/26/2022 CLINICAL DATA:  Head and neck trauma. EXAM: CT HEAD WITHOUT CONTRAST CT CERVICAL SPINE WITHOUT CONTRAST TECHNIQUE: Multidetector CT imaging of the head and cervical spine was performed following the standard protocol without intravenous contrast. Multiplanar CT image reconstructions of the cervical spine were also generated. RADIATION DOSE REDUCTION: This exam was performed according to the departmental dose-optimization program which includes automated exposure control, adjustment of the mA and/or kV according to patient size and/or use of iterative reconstruction technique. COMPARISON:  Head CT 05/07/2008 FINDINGS: CT HEAD FINDINGS Brain: Ventricles, cisterns and other CSF spaces are normal. There is chronic ischemic microvascular disease. No mass, mass effect, shift of midline structures or acute hemorrhage. No evidence of acute infarction. Vascular: No hyperdense vessel or unexpected calcification. Skull: Normal. Negative for fracture or focal lesion. Sinuses/Orbits: No acute finding. Other: None. CT CERVICAL SPINE FINDINGS Alignment: No posttraumatic subluxation. Skull base and vertebrae: Vertebral body heights are maintained. There is mild moderate spondylosis throughout the cervical spine to include uncovertebral  joint spurring and facet arthropathy. Mild bilateral neural foraminal narrowing at the C4-5 level with right-sided neural foraminal narrowing at the C5-6 level. Moderate right-sided neural foraminal narrowing at the C6-7 level. No acute fracture. Soft tissues and spinal canal: No prevertebral fluid or swelling. No visible canal hematoma. Disc levels: Moderate disc space narrowing at the C4-5, C5-6 and C6-7 levels. Upper chest: No acute findings. Other: None. IMPRESSION: 1. No acute brain injury. 2. Chronic ischemic microvascular disease. 3. No acute cervical spine injury. 4. Mild-to-moderate spondylosis throughout the cervical spine with disc disease at the C4-5, C5-6 and C6-7 levels. Neural foraminal narrowing as described. Electronically Signed   By: Marin Olp M.D.   On: 06/26/2022 16:24   DG Chest Portable 1 View  Result Date: 06/26/2022 CLINICAL DATA:  Fall from standing. EXAM: PORTABLE CHEST 1 VIEW COMPARISON:  Chest radiograph dated 06/23/2020. FINDINGS: The heart is enlarged. Vascular calcifications are seen in the aortic arch. The lungs are clear. There is no pleural effusion or pneumothorax. Emphysematous changes are noted. Degenerative changes are seen in the spine. IMPRESSION: 1. No active disease. 2. Cardiomegaly. Electronically Signed   By: Zerita Boers M.D.   On: 06/26/2022 16:22   DG Pelvis Portable  Result Date: 06/26/2022 CLINICAL DATA:  Fall EXAM: PORTABLE PELVIS 1-2 VIEWS COMPARISON:  08/05/2019, 08/03/2019 FINDINGS: SI joints are non widened. Pubic symphysis and rami appear intact. Status post left hip replacement with intact hardware and normal alignment. No definitive fracture is seen IMPRESSION: Status post  left hip replacement. No acute osseous abnormality. Electronically Signed   By: Donavan Foil M.D.   On: 06/26/2022 16:22      LOS: 1 day    Cordelia Poche, MD [facility] 06/27/2022, 8:00 AM   If 7PM-7AM, please contact night-coverage [email or web address]

## 2022-06-27 NOTE — TOC CAGE-AID Note (Signed)
Transition of Care [facility]) - CAGE-AID Screening   Patient Details  Name: OVAL BIRT MRN: [id number] Date of Birth: 1930/11/16  [facility]) CM/SW Contact:    Army Melia, RN Phone [phone] 06/27/2022, 7:48 PM  CAGE-AID Screening:    Have You Ever Felt You Ought to Cut Down on Your Drinking or Drug Use?: No Have People Annoyed You By Critizing Your Drinking Or Drug Use?: No Have You Felt Bad Or Guilty About Your Drinking Or Drug Use?: No Have You Ever Had a Drink or Used Drugs First Thing In The Morning to Steady Your Nerves or to Get Rid of a Hangover?: No CAGE-AID Score: 0  Substance Abuse Education Offered: No (no drug/alcohol use, no resources indicated)

## 2022-06-27 NOTE — Hospital Course (Addendum)
[name] is a 89 y.o. female with a history of atrial fibrillation, HFrEF, anxiety, depression, GERD. Patient presented after falling and suffering a tibia/fibula fracture. Orthopedic surgery consulted.

## 2022-06-27 NOTE — Anesthesia Preprocedure Evaluation (Signed)
Anesthesia Evaluation  Patient identified by MRN, date of birth, ID band Patient awake  General Assessment Comment:With cath in 2010 "lasted 3 days"  Reviewed: Allergy & Precautions, NPO status , Patient's Chart, lab work & pertinent test results  History of Anesthesia Complications (+) Emergence Delirium and history of anesthetic complications  Airway Mallampati: II  TM Distance: >3 FB Neck ROM: Full    Dental  (+) Dental Advisory Given, Teeth Intact   Pulmonary shortness of breath and with exertion pHTN   Pulmonary exam normal breath sounds clear to auscultation       Cardiovascular Exercise Tolerance: Poor hypertension, Pt. on medications pulmonary hypertension+ Past MI and +CHF  + dysrhythmias Atrial Fibrillation + Valvular Problems/Murmurs  Rhythm:Irregular Rate:Normal + Systolic murmurs Echo [id number]  1. Left ventricular ejection fraction, by estimation, is 25 to 30%. Left ventricular ejection fraction by 3D volume is 30 %. Left ventricular ejection fraction by 2D MOD biplane is 26.8%. The left ventricle has severely decreased function. The left ventricle demonstrates global hypokinesis. Left ventricular diastolic function could not be evaluated. The average left ventricular global longitudinal strain is -7.9 %. The global longitudinal strain is abnormal.   2. Right ventricular systolic function is mildly reduced. The right ventricular size is mildly enlarged. There is mildly elevated pulmonary artery systolic pressure. The estimated right ventricular systolic pressure is 0000000 mmHg.   3. Left atrial size was severely dilated.   4. Right atrial size was severely dilated.   5. The mitral valve is grossly normal. Mild mitral valve regurgitation. No evidence of mitral stenosis.   6. Tricuspid valve regurgitation is moderate.   7. The aortic valve is tricuspid. There is mild calcification of the aortic valve. There is mild thickening of  the aortic valve. Aortic valve regurgitation is mild. Mild aortic valve sclerosis is present, with no evidence of aortic valve stenosis.   8. The inferior vena cava is normal in size with greater than 50% respiratory variability, suggesting right atrial pressure of 3 mmHg.   Comparison(s): Changes from prior study are noted. LVEF now 25-30%. RV  size and function have improved. RVSP improved from prior.    Echo 07/2019 LVEF 35% with global hypokinesis and moderate LVH.  She also had moderately reduced right-sided function with severe pulmonary hypertension.  PASP was 83 mmHg. EF improved from 15% in 2019   Neuro/Psych  PSYCHIATRIC DISORDERS Anxiety Depression     Neuromuscular disease    GI/Hepatic Neg liver ROS,GERD  Medicated and Controlled,,diverticulosis   Endo/Other  diabetes, Well Controlled, Type 2, Oral Hypoglycemic Agents    Renal/GU negative Renal ROS  negative genitourinary   Musculoskeletal  (+) Arthritis , Osteoarthritis,  Right ankle Fx   Abdominal   Peds  Hematology  (+) Blood dyscrasia, anemia Eliquis therapy- last dose yesterday   Anesthesia Other Findings   Reproductive/Obstetrics                             Anesthesia Physical Anesthesia Plan  ASA: 4  Anesthesia Plan: Regional   Post-op Pain Management: Tylenol PO (pre-op)*   Induction: Intravenous  PONV Risk Score and Plan: 2 and 4 or greater and Ondansetron and Treatment may vary due to age or medical condition  Airway Management Planned: Natural Airway and Simple Face Mask  Additional Equipment: Arterial line  Intra-op Plan:   Post-operative Plan:   Informed Consent: I have reviewed the patients History and Physical, chart, labs  and discussed the procedure including the risks, benefits and alternatives for the proposed anesthesia with the patient or authorized representative who has indicated his/her understanding and acceptance.     Dental advisory given  Plan  Discussed with: CRNA  Anesthesia Plan Comments:         Anesthesia Quick Evaluation

## 2022-06-27 NOTE — Progress Notes (Signed)
[facility] on call notified of ST elevation reported by CCMD- no new orders

## 2022-06-27 NOTE — Progress Notes (Signed)
Call rapid nurse to help assist with Amiodaron order

## 2022-06-28 ENCOUNTER — Ambulatory Visit: Payer: Medicare Other | Admitting: Cardiovascular Disease

## 2022-06-28 ENCOUNTER — Encounter (HOSPITAL_COMMUNITY): Payer: Self-pay | Admitting: Internal Medicine

## 2022-06-28 ENCOUNTER — Encounter (HOSPITAL_COMMUNITY)
Admission: EM | Disposition: A | Discharge status: Home Health | Payer: Self-pay | Source: Home / Self Care | Attending: Internal Medicine

## 2022-06-28 ENCOUNTER — Inpatient Hospital Stay (HOSPITAL_COMMUNITY): Payer: Medicare Other

## 2022-06-28 ENCOUNTER — Inpatient Hospital Stay (HOSPITAL_COMMUNITY): Payer: Medicare Other | Admitting: Anesthesiology

## 2022-06-28 ENCOUNTER — Other Ambulatory Visit: Payer: Self-pay

## 2022-06-28 DIAGNOSIS — I4891 Unspecified atrial fibrillation: Secondary | ICD-10-CM | POA: Diagnosis not present

## 2022-06-28 DIAGNOSIS — E119 Type 2 diabetes mellitus without complications: Secondary | ICD-10-CM

## 2022-06-28 DIAGNOSIS — I4811 Longstanding persistent atrial fibrillation: Secondary | ICD-10-CM | POA: Diagnosis not present

## 2022-06-28 DIAGNOSIS — S82201A Unspecified fracture of shaft of right tibia, initial encounter for closed fracture: Secondary | ICD-10-CM | POA: Diagnosis not present

## 2022-06-28 DIAGNOSIS — I509 Heart failure, unspecified: Secondary | ICD-10-CM

## 2022-06-28 DIAGNOSIS — I110 Hypertensive heart disease with heart failure: Secondary | ICD-10-CM

## 2022-06-28 DIAGNOSIS — I5042 Chronic combined systolic (congestive) and diastolic (congestive) heart failure: Secondary | ICD-10-CM | POA: Diagnosis not present

## 2022-06-28 DIAGNOSIS — M978XXA Periprosthetic fracture around other internal prosthetic joint, initial encounter: Secondary | ICD-10-CM

## 2022-06-28 DIAGNOSIS — Z7984 Long term (current) use of oral hypoglycemic drugs: Secondary | ICD-10-CM

## 2022-06-28 DIAGNOSIS — I272 Pulmonary hypertension, unspecified: Secondary | ICD-10-CM

## 2022-06-28 DIAGNOSIS — F419 Anxiety disorder, unspecified: Secondary | ICD-10-CM | POA: Diagnosis not present

## 2022-06-28 HISTORY — PX: ORIF TIBIA PLATEAU: SHX2132

## 2022-06-28 LAB — BASIC METABOLIC PANEL
Anion gap: 10 (ref 5–15)
BUN: 24 mg/dL — ABNORMAL HIGH (ref 8–23)
CO2: 27 mmol/L (ref 22–32)
Calcium: 8.9 mg/dL (ref 8.9–10.3)
Chloride: 97 mmol/L — ABNORMAL LOW (ref 98–111)
Creatinine, Ser: 1.02 mg/dL — ABNORMAL HIGH (ref 0.44–1.00)
GFR, Estimated: 53 mL/min — ABNORMAL LOW (ref >60)
Glucose, Bld: 113 mg/dL — ABNORMAL HIGH (ref 70–99)
Potassium: 3.9 mmol/L (ref 3.5–5.1)
Sodium: 134 mmol/L — ABNORMAL LOW (ref 135–145)

## 2022-06-28 LAB — MAGNESIUM: Magnesium: 1.8 mg/dL (ref 1.7–2.4)

## 2022-06-28 LAB — ECHOCARDIOGRAM COMPLETE
Height: 65 in
S' Lateral: 4.1 cm
Weight: 2574.97 oz

## 2022-06-28 SURGERY — OPEN REDUCTION INTERNAL FIXATION (ORIF) TIBIAL PLATEAU
Anesthesia: Regional | Site: Leg Lower | Laterality: Right

## 2022-06-28 MED ORDER — METOCLOPRAMIDE HCL 5 MG/ML IJ SOLN: 5.0000 mg | Freq: Three times a day (TID) | INTRAMUSCULAR | Status: DC | PRN

## 2022-06-28 MED ORDER — CHLORHEXIDINE GLUCONATE 0.12 % MT SOLN: 15.0000 mL | Freq: Once | OROMUCOSAL | Status: AC

## 2022-06-28 MED ORDER — CHLORHEXIDINE GLUCONATE 0.12 % MT SOLN
OROMUCOSAL | Status: AC
  Administered 2022-06-28: 15 mL via OROMUCOSAL
  Filled 2022-06-28: qty 15

## 2022-06-28 MED ORDER — PROPOFOL 500 MG/50ML IV EMUL
INTRAVENOUS | Status: DC | PRN
  Administered 2022-06-28: 50 ug/kg/min via INTRAVENOUS

## 2022-06-28 MED ORDER — VANCOMYCIN HCL 1000 MG IV SOLR
INTRAVENOUS | Status: AC
  Filled 2022-06-28: qty 20

## 2022-06-28 MED ORDER — BUPIVACAINE HCL (PF) 0.5 % IJ SOLN
INTRAMUSCULAR | Status: AC
  Filled 2022-06-28: qty 30

## 2022-06-28 MED ORDER — LACTATED RINGERS IV SOLN: INTRAVENOUS | Status: DC

## 2022-06-28 MED ORDER — PHENYLEPHRINE HCL-NACL 20-0.9 MG/250ML-% IV SOLN
INTRAVENOUS | Status: DC | PRN
  Administered 2022-06-28: 30 ug/min via INTRAVENOUS

## 2022-06-28 MED ORDER — METOCLOPRAMIDE HCL 10 MG PO TABS: 5.0000 mg | ORAL_TABLET | Freq: Three times a day (TID) | ORAL | Status: DC | PRN

## 2022-06-28 MED ORDER — HYDROCODONE-ACETAMINOPHEN 5-325 MG PO TABS
1.0000 | ORAL_TABLET | Freq: Four times a day (QID) | ORAL | Status: DC | PRN
  Administered 2022-06-29: 2 via ORAL
  Administered 2022-06-30: 1 via ORAL
  Filled 2022-06-28: qty 2
  Filled 2022-06-28 (×2): qty 1

## 2022-06-28 MED ORDER — PROPOFOL 10 MG/ML IV BOLUS
INTRAVENOUS | Status: DC | PRN
  Administered 2022-06-28: 20 mg via INTRAVENOUS
  Administered 2022-06-28: 15 mg via INTRAVENOUS

## 2022-06-28 MED ORDER — ONDANSETRON HCL 4 MG/2ML IJ SOLN: 4.0000 mg | Freq: Four times a day (QID) | INTRAMUSCULAR | Status: DC | PRN

## 2022-06-28 MED ORDER — VANCOMYCIN HCL 1000 MG IV SOLR
INTRAVENOUS | Status: DC | PRN
  Administered 2022-06-28: 1000 mg via TOPICAL

## 2022-06-28 MED ORDER — CEFAZOLIN SODIUM-DEXTROSE 2-3 GM-%(50ML) IV SOLR
INTRAVENOUS | Status: DC | PRN
  Administered 2022-06-28: 2 g via INTRAVENOUS

## 2022-06-28 MED ORDER — CEFAZOLIN SODIUM-DEXTROSE 2-4 GM/100ML-% IV SOLN
2.0000 g | Freq: Three times a day (TID) | INTRAVENOUS | Status: AC
  Administered 2022-06-28 – 2022-06-29 (×3): 2 g via INTRAVENOUS
  Filled 2022-06-28 (×3): qty 100

## 2022-06-28 MED ORDER — FENTANYL CITRATE (PF) 100 MCG/2ML IJ SOLN
INTRAMUSCULAR | Status: AC
  Administered 2022-06-28: 50 ug via INTRAVENOUS
  Filled 2022-06-28: qty 2

## 2022-06-28 MED ORDER — ONDANSETRON HCL 4 MG PO TABS: 4.0000 mg | ORAL_TABLET | Freq: Four times a day (QID) | ORAL | Status: DC | PRN

## 2022-06-28 MED ORDER — ORAL CARE MOUTH RINSE: 15.0000 mL | Freq: Once | OROMUCOSAL | Status: AC

## 2022-06-28 MED ORDER — BUPIVACAINE-EPINEPHRINE (PF) 0.5% -1:200000 IJ SOLN
INTRAMUSCULAR | Status: DC | PRN
  Administered 2022-06-28: 20 mL

## 2022-06-28 MED ORDER — MORPHINE SULFATE (PF) 2 MG/ML IV SOLN: 0.5000 mg | INTRAVENOUS | Status: DC | PRN

## 2022-06-28 MED ORDER — 0.9 % SODIUM CHLORIDE (POUR BTL) OPTIME
TOPICAL | Status: DC | PRN
  Administered 2022-06-28: 1000 mL

## 2022-06-28 MED ORDER — FENTANYL CITRATE (PF) 100 MCG/2ML IJ SOLN: 50.0000 ug | Freq: Once | INTRAMUSCULAR | Status: AC

## 2022-06-28 MED ORDER — SENNA 8.6 MG PO TABS
1.0000 | ORAL_TABLET | Freq: Two times a day (BID) | ORAL | Status: DC
  Administered 2022-06-28 – 2022-06-30 (×4): 8.6 mg via ORAL
  Filled 2022-06-28 (×5): qty 1

## 2022-06-28 MED ORDER — ACETAMINOPHEN 325 MG PO TABS
325.0000 mg | ORAL_TABLET | Freq: Four times a day (QID) | ORAL | Status: DC | PRN
  Administered 2022-06-30 (×2): 650 mg via ORAL
  Administered 2022-07-01: 325 mg via ORAL
  Filled 2022-06-28 (×3): qty 2

## 2022-06-28 MED ORDER — ONDANSETRON HCL 4 MG/2ML IJ SOLN
INTRAMUSCULAR | Status: DC | PRN
  Administered 2022-06-28: 4 mg via INTRAVENOUS

## 2022-06-28 SURGICAL SUPPLY — 84 items
BAG COUNTER SPONGE SURGICOUNT (BAG) ×1 IMPLANT
BANDAGE ESMARK 6X9 LF (GAUZE/BANDAGES/DRESSINGS) ×1 IMPLANT
BIT DRILL 3.3 LONG (BIT) IMPLANT
BIT DRILL QC 3.3X195 (BIT) IMPLANT
BLADE CLIPPER SURG (BLADE) IMPLANT
BLADE SURG 10 STRL SS (BLADE) ×1 IMPLANT
BLADE SURG 15 STRL LF DISP TIS (BLADE) ×1 IMPLANT
BLADE SURG 15 STRL SS (BLADE)
BNDG COHESIVE 4X5 TAN STRL (GAUZE/BANDAGES/DRESSINGS) ×1 IMPLANT
BNDG ELASTIC 4X5.8 VLCR STR LF (GAUZE/BANDAGES/DRESSINGS) ×1 IMPLANT
BNDG ELASTIC 6X10 VLCR STRL LF (GAUZE/BANDAGES/DRESSINGS) IMPLANT
BNDG ELASTIC 6X5.8 VLCR STR LF (GAUZE/BANDAGES/DRESSINGS) ×1 IMPLANT
BNDG ESMARK 6X9 LF (GAUZE/BANDAGES/DRESSINGS)
BNDG GAUZE DERMACEA FLUFF 4 (GAUZE/BANDAGES/DRESSINGS) ×1 IMPLANT
BRUSH SCRUB EZ PLAIN DRY (MISCELLANEOUS) ×2 IMPLANT
CANISTER SUCT 3000ML PPV (MISCELLANEOUS) ×1 IMPLANT
CAP LOCK NCB (Cap) IMPLANT
COVER SURGICAL LIGHT HANDLE (MISCELLANEOUS) ×1 IMPLANT
CUFF TOURN SGL QUICK 34 (TOURNIQUET CUFF)
CUFF TRNQT CYL 34X4.125X (TOURNIQUET CUFF) ×1 IMPLANT
DRAPE C-ARM 42X72 X-RAY (DRAPES) ×1 IMPLANT
DRAPE C-ARMOR (DRAPES) ×1 IMPLANT
DRAPE HALF SHEET 40X57 (DRAPES) IMPLANT
DRAPE INCISE IOBAN 66X45 STRL (DRAPES) ×1 IMPLANT
DRAPE U-SHAPE 47X51 STRL (DRAPES) ×1 IMPLANT
DRESSING MEPILEX FLEX 4X4 (GAUZE/BANDAGES/DRESSINGS) IMPLANT
DRSG ADAPTIC 3X8 NADH LF (GAUZE/BANDAGES/DRESSINGS) ×1 IMPLANT
DRSG MEPILEX FLEX 4X4 (GAUZE/BANDAGES/DRESSINGS) ×1
DRSG MEPITEL 3X4 ME34 (GAUZE/BANDAGES/DRESSINGS) IMPLANT
ELECT REM PT RETURN 9FT ADLT (ELECTROSURGICAL) ×1
ELECTRODE REM PT RTRN 9FT ADLT (ELECTROSURGICAL) ×1 IMPLANT
GAUZE PAD ABD 8X10 STRL (GAUZE/BANDAGES/DRESSINGS) ×2 IMPLANT
GAUZE SPONGE 4X4 12PLY STRL (GAUZE/BANDAGES/DRESSINGS) ×1 IMPLANT
GLOVE BIO SURGEON STRL SZ7.5 (GLOVE) ×1 IMPLANT
GLOVE BIO SURGEON STRL SZ8 (GLOVE) ×1 IMPLANT
GLOVE BIOGEL PI IND STRL 7.5 (GLOVE) ×1 IMPLANT
GLOVE BIOGEL PI IND STRL 8 (GLOVE) ×1 IMPLANT
GLOVE SURG ORTHO LTX SZ7.5 (GLOVE) ×2 IMPLANT
GOWN STRL REUS W/ TWL LRG LVL3 (GOWN DISPOSABLE) ×2 IMPLANT
GOWN STRL REUS W/ TWL XL LVL3 (GOWN DISPOSABLE) ×1 IMPLANT
GOWN STRL REUS W/TWL LRG LVL3 (GOWN DISPOSABLE) ×2
GOWN STRL REUS W/TWL XL LVL3 (GOWN DISPOSABLE) ×1
IMMOBILIZER KNEE 22 UNIV (SOFTGOODS) ×1 IMPLANT
K-WIRE 2.0 (WIRE) ×1
K-WIRE FXSTD 280X2XNS SS (WIRE) ×1
KIT BASIN OR (CUSTOM PROCEDURE TRAY) ×1 IMPLANT
KIT TURNOVER KIT B (KITS) ×1 IMPLANT
KWIRE FXSTD 280X2XNS SS (WIRE) IMPLANT
NDL SUT 6 .5 CRC .975X.05 MAYO (NEEDLE) IMPLANT
NEEDLE MAYO TAPER (NEEDLE)
NS IRRIG 1000ML POUR BTL (IV SOLUTION) ×1 IMPLANT
PACK ORTHO EXTREMITY (CUSTOM PROCEDURE TRAY) ×1 IMPLANT
PAD ARMBOARD 7.5X6 YLW CONV (MISCELLANEOUS) ×2 IMPLANT
PAD CAST 4YDX4 CTTN HI CHSV (CAST SUPPLIES) ×1 IMPLANT
PADDING CAST ABS COTTON 4X4 ST (CAST SUPPLIES) IMPLANT
PADDING CAST COTTON 4X4 STRL (CAST SUPPLIES)
PADDING CAST COTTON 6X4 STRL (CAST SUPPLIES) ×1 IMPLANT
PLATE LOCK LAT 292 RT 13H (Plate) IMPLANT
SCREW HUM NCB PA ST 4X60 (Screw) IMPLANT
SCREW NCB 4.0 28MM (Screw) IMPLANT
SCREW NCB 4.0MX30M (Screw) IMPLANT
SCREW NCB 4.0MX50M (Screw) IMPLANT
SCREW NCB 4.0MX65M (Screw) IMPLANT
SCREW NCB 4.0X40MM (Screw) IMPLANT
SCREW NCB 4X3 4X70 (Screw) IMPLANT
SPONGE T-LAP 18X18 ~~LOC~~+RFID (SPONGE) ×1 IMPLANT
STAPLER VISISTAT 35W (STAPLE) ×1 IMPLANT
STOCKINETTE IMPERVIOUS LG (DRAPES) ×1 IMPLANT
SUCTION FRAZIER HANDLE 10FR (MISCELLANEOUS) ×1
SUCTION TUBE FRAZIER 10FR DISP (MISCELLANEOUS) ×1 IMPLANT
SUT ETHILON 2 0 FS 18 (SUTURE) IMPLANT
SUT PROLENE 0 CT 2 (SUTURE) ×2 IMPLANT
SUT VIC AB 0 CT1 27 (SUTURE) ×1
SUT VIC AB 0 CT1 27XBRD ANBCTR (SUTURE) ×1 IMPLANT
SUT VIC AB 1 CT1 27 (SUTURE)
SUT VIC AB 1 CT1 27XBRD ANBCTR (SUTURE) ×1 IMPLANT
SUT VIC AB 2-0 CT1 27 (SUTURE) ×1
SUT VIC AB 2-0 CT1 TAPERPNT 27 (SUTURE) ×2 IMPLANT
TOWEL GREEN STERILE (TOWEL DISPOSABLE) ×2 IMPLANT
TOWEL GREEN STERILE FF (TOWEL DISPOSABLE) ×1 IMPLANT
TRAY FOLEY MTR SLVR 16FR STAT (SET/KITS/TRAYS/PACK) IMPLANT
TUBE CONNECTING 12X1/4 (SUCTIONS) ×1 IMPLANT
WATER STERILE IRR 1000ML POUR (IV SOLUTION) ×2 IMPLANT
YANKAUER SUCT BULB TIP NO VENT (SUCTIONS) ×1 IMPLANT

## 2022-06-28 NOTE — Consult Note (Signed)
Orthopaedic Trauma Service (OTS) Consult   Patient ID: [name] MRN: [id number] DOB/AGE: 1931-04-01 89 y.o.  Reason for Consult:Right tibia fracture Referring Physician: Dr. Georgeanna Harrison, MD [name]  HPI: [name] is an 89 y.o. female who is being seen in consultation at the request of Dr. Mable Fill for evaluation of right periprosthetic tibia fracture.  Patient had a ground-level fall sustaining a right periprosthetic proximal tibia fracture.  Due to the unstable nature of her injury and the complexity of the injury Dr. Mable Fill felt that this was outside the scope of practice and required open reduction internal fixation by orthopedic trauma surgery.  Patient was seen and evaluated in the preoperative holding area.  Discussed with her daughter who is at bedside as well.  Patient lives with her daughter and ambulates with a rolling walker at baseline.  Does have a heart history and had atrial fibrillation with RVR yesterday which has come down and is appropriately managed.  Cardiology has seen her.  She is otherwise doing okay.  Notes that she is having pain in her right lower extremity.  Past Medical History:  Diagnosis Date   Acute combined systolic and diastolic heart failure ([place]) 05/02/2017   Anemia    Anxiety    Arthritis    "knees" (06/18/2015)   Atrial fibrillation with RVR ([place]) 06/18/2015   h/o sig bleed on coumadin   Cardiogenic shock [id number]   Complication of anesthesia 2010   "w/cardiac cath; got into a psychotic state for ~ 3 days; got me out w/Valium"   Depression    "I have it off and on; not as often as I've gotten older" (06/18/2015)   Diverticulosis    descending and sigmoid colon--Dr. Sharlett Iles   DJD (degenerative joint disease) of knee    left   Dyspnea    with exertion and in morning mostly when wakes up    E coli infection    History of   GERD (gastroesophageal reflux disease)    History of blood transfusion    History of Clostridium  difficile colitis    IBS (irritable bowel syndrome)    Insomnia     off chronic ambien 5mg  as of 10/11, rare/episodic use since then  DCM/CHF   LBBB (left bundle branch block)    Lymphocytic colitis 09/27/2016   Moderate to severe pulmonary hypertension ([place]) 11/26/2019   Nonischemic cardiomyopathy ([place])    normal coronaries, EF 15-20% 04/2008, EF 50-55% 2015   Osteoporosis    on DXA 05/2010   Positive PPD    Retroperitoneal bleed    Sciatica    Right   Urinary incontinence    Occassionally    Past Surgical History:  Procedure Laterality Date   ANTERIOR APPROACH HEMI HIP ARTHROPLASTY Left 08/05/2019   Procedure: ANTERIOR APPROACH HEMI HIP ARTHROPLASTY;  Surgeon: Dorna Leitz, MD;  Location: [facility];  Service: Orthopedics;  Laterality: Left;   BIOPSY  11/22/2017   Procedure: BIOPSY;  Surgeon: Gatha Mayer, MD;  Location: [facility];  Service: Endoscopy;;   CARDIAC CATHETERIZATION  2010   CARDIOVERSION N/A 06/19/2015   Procedure: CARDIOVERSION;  Surgeon: Larey Dresser, MD;  Location: [place];  Service: Cardiovascular;  Laterality: N/A;   CATARACT EXTRACTION W/ INTRAOCULAR LENS  IMPLANT, BILATERAL Bilateral    COLONOSCOPY WITH PROPOFOL N/A 11/22/2017   Procedure: COLONOSCOPY WITH PROPOFOL;  Surgeon: Gatha Mayer, MD;  [facility]: [facility];  Service: Endoscopy;  Laterality: N/A;   DILATION AND CURETTAGE OF  UTERUS     ORIF ANKLE FRACTURE Right 09/22/2020   Procedure: RIGHT ANKLE OPEN REDUCTION INTERNAL FIXATION (ORIF);  Surgeon: Melrose Nakayama, MD;  Location: [facility];  Service: Orthopedics;  Laterality: Right;   TEE WITHOUT CARDIOVERSION N/A 06/19/2015   Procedure: TRANSESOPHAGEAL ECHOCARDIOGRAM (TEE);  Surgeon: Larey Dresser, MD;  Location: [place];  Service: Cardiovascular;  Laterality: N/A;   TOTAL KNEE ARTHROPLASTY Left 04/25/2017   Procedure: TOTAL KNEE ARTHROPLASTY;  Surgeon: Melrose Nakayama, MD;  Location: [place];  Service: Orthopedics;  Laterality: Left;   TOTAL  KNEE ARTHROPLASTY Right 02/18/2020   Procedure: RIGHT TOTAL KNEE ARTHROPLASTY;  Surgeon: Melrose Nakayama, MD;  Location: [facility];  Service: Orthopedics;  Laterality: Right;   TOTAL KNEE ARTHROPLASTY Right 06/16/2020   Procedure: RIGHT RETINACULAR REPAIR;  Surgeon: Melrose Nakayama, MD;  Location[facility];  Service: Orthopedics;  Laterality: Right;   TOTAL KNEE REVISION Left 12/03/2019   Procedure: LEFT TOTAL KNEE REVISION  OF POYLY EXCHANGE;  Surgeon: Melrose Nakayama, MD;  Location: [facility];  Service: Orthopedics;  Laterality: Left;   VAGINAL HYSTERECTOMY  1979   ovaries intact    Family History  Problem Relation Age of Onset   Colon cancer Father        possible colon cancer   Other Father        esophagus tumor?   Tuberculosis Mother    Breast cancer Neg Hx    Stomach cancer Neg Hx    Pancreatic cancer Neg Hx     Social History:  reports that she has never smoked. She has never used smokeless tobacco. She reports that she does not drink alcohol and does not use drugs.  Allergies:  Allergies  Allergen Reactions   Ace Inhibitors Cough   Warfarin Sodium Other (See Comments)    DOSE RELATED PHARMACOLOGIC EFFECT "bleed out"   Famotidine Other (See Comments)    GI upset   Vancomycin Nausea Only   Codeine Other (See Comments)    sedation   Delsym [Dextromethorphan Polistirex Er] Other (See Comments)    dizziness   Lactose Intolerance (Gi) Diarrhea   Lasix [Furosemide] Diarrhea    Oral Lasix gives her diarrhea, tolerates IV Rx   Phenylephrine Palpitations and Other (See Comments)    Nasal spray- "likely increase in nasal congestion".    Sulfamethoxazole-Trimethoprim Nausea And Vomiting    GI intolerance.    Medications:  No current facility-administered medications on file prior to encounter.   Current Outpatient Medications on File Prior to Encounter  Medication Sig Dispense Refill   acetaminophen (TYLENOL) 500 MG tablet Take 1,000 mg by mouth in the morning and at bedtime.      apixaban (ELIQUIS) 2.5 MG TABS tablet TAKE 1 TABLET BY MOUTH TWICE A DAY 180 tablet 1   calcium carbonate (TUMS - DOSED IN MG ELEMENTAL CALCIUM) 500 MG chewable tablet Chew 500 mg by mouth 2 (two) times daily as needed for indigestion or heartburn.     cholecalciferol (VITAMIN D3) 25 MCG (1000 UNIT) tablet Take 1,000 Units by mouth in the morning.     cholestyramine (QUESTRAN) 4 g packet TAKE 1 PACKET (4 G TOTAL) BY MOUTH DAILY WITH SUPPER. 90 packet 1   Cranberry 180 MG CAPS Take 180 mg by mouth daily in the afternoon.     digoxin (LANOXIN) 0.125 MG tablet 1/4 tab by mouth daily. (Patient taking differently: Take 0.0625 mg by mouth daily.) 45 tablet 3   fluticasone (FLONASE) 50 MCG/ACT nasal spray Place  2 sprays into both nostrils daily. 16 g 6   KLOR-CON M20 20 MEQ tablet TAKE 2 TABLETS BY MOUTH DAILY 180 tablet 1   loperamide (IMODIUM) 2 MG capsule Take 2 mg by mouth 3 (three) times daily as needed for diarrhea or loose stools.      loratadine (CLARITIN) 10 MG tablet Take 10 mg by mouth daily.     metoprolol succinate (TOPROL-XL) 100 MG 24 hr tablet Take 1.5 tablets (150 mg total) by mouth daily.     pantoprazole (PROTONIX) 20 MG tablet Take 1 tablet (20 mg total) by mouth daily. 30 tablet 3   torsemide (DEMADEX) 10 MG tablet Take 1 tablet (10 mg total) by mouth daily. Dry weight is 149. If you are above this, take 20mg  torsemide. 135 tablet 3   traMADol (ULTRAM) 50 MG tablet Take 1 tablet (50 mg total) by mouth 3 (three) times daily as needed. (Patient taking differently: Take 50 mg by mouth 3 (three) times daily as needed for moderate pain or severe pain.) 15 tablet 1   venlafaxine XR (EFFEXOR-XR) 37.5 MG 24 hr capsule Take 2 capsules (75 mg total) by mouth every morning. 180 capsule 1     ROS: Constitutional: No fever or chills Vision: No changes in vision ENT: No difficulty swallowing CV: No chest pain Pulm: No SOB or wheezing GI: No nausea or vomiting GU: No urgency or inability to  hold urine Skin: No poor wound healing Neurologic: No numbness or tingling Psychiatric: No depression or anxiety Heme: No bruising Allergic: No reaction to medications or food   Exam: Blood pressure (!) 110/58, pulse 84, temperature 98.1 F (36.7 C), temperature source Oral, resp. rate 16, height 5\' 5"  (1.651 m), weight 73 kg, SpO2 98 %. General: No acute distress Orientation: Awake alert and oriented x 3 Mood and Affect: Cooperative and pleasant Gait: Unable to assess due to her fracture Coordination and balance: Within normal limits  Right lower extremity: Long-leg splint is in place is clean dry and intact.  Compartments are soft and compressible.  Active dorsiflexion plantarflexion of the toes.  She has sensation intact to light touch with brisk cap refill less than 2 seconds.  Left lower extremity: Skin without lesions. No tenderness to palpation. Full painless ROM, full strength in each muscle groups without evidence of instability.   Medical Decision Making: Data: Imaging: Right periprosthetic proximal tibia fracture with extension down into the tibial shaft.  Labs:  Results for orders placed or performed during the hospital encounter of 06/26/22 (from the past 24 hour(s))  Basic metabolic panel     Status: Abnormal   Collection Time: 06/28/22  3:36 AM  Result Value Ref Range   Sodium 134 (L) 135 - 145 mmol/L   Potassium 3.9 3.5 - 5.1 mmol/L   Chloride 97 (L) 98 - 111 mmol/L   CO2 27 22 - 32 mmol/L   Glucose, Bld 113 (H) 70 - 99 mg/dL   BUN 24 (H) 8 - 23 mg/dL   Creatinine, Ser 1.02 (H) 0.44 - 1.00 mg/dL   Calcium 8.9 8.9 - 10.3 mg/dL   GFR, Estimated 53 (L) >60 mL/min   Anion gap 10 5 - 15  Magnesium     Status: None   Collection Time: 06/28/22  3:36 AM  Result Value Ref Range   Magnesium 1.8 1.7 - 2.4 mg/dL     Imaging or Labs ordered: None  Medical history and chart was reviewed and case discussed with medical provider.  Assessment/Plan: 89 year old  female with right periprosthetic proximal tibia fracture.  Due to the unstable nature of her injury I recommend proceeding with open reduction internal fixation.  Risks and benefits were discussed with the patient and her daughter.  Risks included but not limited to bleeding, infection, malunion, nonunion, hardware failure, hardware rotation, nerve or blood vessel injury, DVT, even the possibility anesthetic complications.  They agreed to proceed with surgery and consent was obtained.  Shona Needles, MD [facility] [phone] (office) [email or web address]

## 2022-06-28 NOTE — Anesthesia Postprocedure Evaluation (Signed)
Anesthesia Post Note  Patient: [name]  Procedure(s) Performed: OPEN REDUCTION INTERNAL FIXATION (ORIF) TIBIA (Right: Leg Lower)     Patient location during evaluation: PACU Anesthesia Type: Regional Level of consciousness: awake and alert Pain management: pain level controlled Vital Signs Assessment: post-procedure vital signs reviewed and stable Respiratory status: spontaneous breathing Cardiovascular status: stable Anesthetic complications: no   No notable events documented.  Last Vitals:  Vitals:   06/28/22 1430 06/28/22 1445  BP: 96/61 (!) 104/52  Pulse: 77 (!) 133  Resp: (!) 21 18  Temp:  37 C  SpO2: 95% 98%    Last Pain:  Vitals:   06/28/22 1340  TempSrc:   PainSc: 0-No pain                 Nolon Nations

## 2022-06-28 NOTE — Progress Notes (Signed)
PROGRESS NOTE    Ruble Chmura Larsh  [id number] DOB: 02/03/1931 DOA: 06/26/2022 PCP: [name], MD   Brief Narrative: JESSYKA ATLAS is a 89 y.o. female with a history of atrial fibrillation, HFrEF, anxiety, depression, GERD. Patient presented after falling and suffering a tibia/fibula fracture. Orthopedic surgery consulted.   Assessment and Plan:  Right tibial/fibula fracture Secondary to losing balance while using her walker and falling. X-rays on admission significant for comminuted fracture of the proximal shaft of the tibia and segmental fracture of proximal shaft of the fibula/fibular neck. Orthopedic surgery consulted with recommendation for surgical management. Surgery postponed on 3/18 secondary to uncontrolled atrial fibrillation -NPO -Analgesic therapy -Orthopedic surgery recommendations: Plan for surgery 3/19  Right foot pain Patient with a history of neuropathy but pain is worse with recent fall. Foot x-ray significant for right proximal first phalangeal fracture -Orthopedic surgery recommendations  Persistent atrial fibrillation with RVR RVR secondary to patient not receiving home beta-blocker and acute injury/pain. Hemodynamically stable. Eliquis held on admission secondary to plan for surgery. Amiodarone IV with bolus started with improvement of rates. Cardiology consulted. -Cardiology recommendations: goal rate 120s for surgery -Continue metoprolol, digoxin, amiodarone IV -Transthoracic Echocardiogram  Chronic combined systolic and diastolic heart failure Last LVEF of 25-30% from Transthoracic Echocardiogram in 06/2020. No fluid overload noted. -Continue strict in/out and daily weights -Continue torsemide  Primary hypertension -Continue metoprolol  Hyponatremia Mild. Resolved.  PAD Noted. Patient follows with Cardiology as an outpatient.  Anxiety Depression -Continue venlafaxine  DVT prophylaxis: SCDs Code Status:   Code Status: Full  Code Family Communication: None at [place] Disposition Plan: Discharge home vs SNF pending orthopedic surgery recommendations/management and eventual PT/OT eval/recommendations. Anticipate discharge no earlier than 3/21   Consultants:  Orthopedic surgery  Procedures:  None  Antimicrobials: None    Subjective: Patient reports continued pain. No other concerns this morning.  Objective: BP 113/71 (BP Location: Right Arm)   Pulse (!) 101   Temp 97.6 F (36.4 C) (Oral)   Resp 18   Ht 5\' 5"  (1.651 m)   Wt 73 kg   SpO2 97%   BMI 26.78 kg/m   Examination:  General exam: Appears calm and comfortable Respiratory system: Clear to auscultation. Respiratory effort normal. Cardiovascular system: S1 & S2 heard, irregular rhythm, normal rate Gastrointestinal system: Abdomen is nondistended, soft and nontender. Normal bowel sounds heard. Central nervous system: Alert and oriented. No focal neurological deficits. Musculoskeletal: No calf tenderness. Right lower extremity in a splint Psychiatry: Judgement and insight appear normal. Mood & affect appropriate.    Data Reviewed: I have personally reviewed following labs and imaging studies  CBC Lab Results  Component Value Date   WBC 9.1 06/27/2022   RBC 3.85 (L) 06/27/2022   HGB 11.7 (L) 06/27/2022   HCT 35.9 (L) 06/27/2022   MCV 93.2 06/27/2022   MCH 30.4 06/27/2022   PLT 248 06/27/2022   MCHC 32.6 06/27/2022   RDW 12.5 06/27/2022   LYMPHSABS 1.7 06/26/2022   MONOABS 0.7 06/26/2022   EOSABS 0.3 06/26/2022   BASOSABS 0.0 [id number]     Last metabolic panel Lab Results  Component Value Date   NA 134 (L) 06/28/2022   K 3.9 06/28/2022   CL 97 (L) 06/28/2022   CO2 27 06/28/2022   BUN 24 (H) 06/28/2022   CREATININE 1.02 (H) 06/28/2022   GLUCOSE 113 (H) 06/28/2022   GFRNONAA 53 (L) 06/28/2022   GFRAA 83 05/28/2020   CALCIUM 8.9 06/28/2022  PHOS 3.6 04/30/2017   PROT 6.6 04/22/2022   ALBUMIN 4.0 04/22/2022   LABGLOB  2.3 02/05/2018   AGRATIO 1.7 02/05/2018   BILITOT 0.7 04/22/2022   ALKPHOS 83 04/22/2022   AST 20 04/22/2022   ALT 11 04/22/2022   ANIONGAP 10 06/28/2022    GFR: Estimated Creatinine Clearance: 37.4 mL/min (A) (by C-G formula based on SCr of 1.02 mg/dL (H)).  No results found for this or any previous visit (from the past 240 hour(s)).    Radiology Studies: DG Foot Complete Right  Result Date: 06/27/2022 CLINICAL DATA:  Right foot pain, initial encounter EXAM: RIGHT FOOT COMPLETE - 3+ VIEW COMPARISON:  None Available. FINDINGS: Postsurgical changes are noted in the distal tibia and fibula. Mildly angulated fracture of the first proximal phalanx is noted. No other fractures are seen. Splinting material is noted in place. IMPRESSION: First proximal phalangeal fracture. Electronically Signed   By: Inez Catalina M.D.   On: 06/27/2022 16:12   DG Ankle Right Port  Result Date: 06/26/2022 CLINICAL DATA:  Fall with leg pain. EXAM: PORTABLE RIGHT ANKLE - 2 VIEW COMPARISON:  Report from right foot radiographs dated 02/07/2000. FINDINGS: No acute fracture around the ankle. No joint dislocation. Fixation hardware is seen in the distal fibula and in the medial malleolus. No significant soft tissue swelling around the ankle. IMPRESSION: 1. No acute osseous injury around the ankle. Electronically Signed   By: Zerita Boers M.D.   On: 06/26/2022 16:27   DG Knee 1-2 Views Right  Result Date: 06/26/2022 CLINICAL DATA:  Fall with leg pain. EXAM: RIGHT KNEE - 1-2 VIEW COMPARISON:  None Available. FINDINGS: There is a comminuted fracture of the proximal shaft of the tibia. There is a segmental fracture of the proximal shaft of the fibula and the fibular neck. The patient is status post total knee replacement. There is no knee joint dislocation or knee joint effusion. There is soft tissue swelling surrounding the leg. IMPRESSION: 1. Comminuted fracture of the proximal shaft of the tibia. 2. Segmental fracture of  the proximal shaft of the fibula and fibular neck. Electronically Signed   By: Zerita Boers M.D.   On: 06/26/2022 16:27   DG Tibia/Fibula Right  Result Date: 06/26/2022 CLINICAL DATA:  Fall with leg pain. EXAM: RIGHT TIBIA AND FIBULA - 2 VIEW COMPARISON:  None Available. FINDINGS: There is a comminuted fracture of the proximal shaft of the tibia. There is a segmental fracture of the proximal shaft of the fibula and the fibular neck. The patient is status post total knee replacement. Fixation hardware is seen in the distal fibula and in the medial malleolus. There is soft tissue swelling surrounding the leg. IMPRESSION: 1. Comminuted fracture of the proximal shaft of the tibia. 2. Segmental fracture of the proximal shaft of the fibula and fibular neck. Electronically Signed   By: Zerita Boers M.D.   On: 06/26/2022 16:25   CT Head Wo Contrast  Result Date: 06/26/2022 CLINICAL DATA:  Head and neck trauma. EXAM: CT HEAD WITHOUT CONTRAST CT CERVICAL SPINE WITHOUT CONTRAST TECHNIQUE: Multidetector CT imaging of the head and cervical spine was performed following the standard protocol without intravenous contrast. Multiplanar CT image reconstructions of the cervical spine were also generated. RADIATION DOSE REDUCTION: This exam was performed according to the departmental dose-optimization program which includes automated exposure control, adjustment of the mA and/or kV according to patient size and/or use of iterative reconstruction technique. COMPARISON:  Head CT 05/07/2008 FINDINGS: CT HEAD FINDINGS  Brain: Ventricles, cisterns and other CSF spaces are normal. There is chronic ischemic microvascular disease. No mass, mass effect, shift of midline structures or acute hemorrhage. No evidence of acute infarction. Vascular: No hyperdense vessel or unexpected calcification. Skull: Normal. Negative for fracture or focal lesion. Sinuses/Orbits: No acute finding. Other: None. CT CERVICAL SPINE FINDINGS Alignment: No  posttraumatic subluxation. Skull base and vertebrae: Vertebral body heights are maintained. There is mild moderate spondylosis throughout the cervical spine to include uncovertebral joint spurring and facet arthropathy. Mild bilateral neural foraminal narrowing at the C4-5 level with right-sided neural foraminal narrowing at the C5-6 level. Moderate right-sided neural foraminal narrowing at the C6-7 level. No acute fracture. Soft tissues and spinal canal: No prevertebral fluid or swelling. No visible canal hematoma. Disc levels: Moderate disc space narrowing at the C4-5, C5-6 and C6-7 levels. Upper chest: No acute findings. Other: None. IMPRESSION: 1. No acute brain injury. 2. Chronic ischemic microvascular disease. 3. No acute cervical spine injury. 4. Mild-to-moderate spondylosis throughout the cervical spine with disc disease at the C4-5, C5-6 and C6-7 levels. Neural foraminal narrowing as described. Electronically Signed   By: Marin Olp M.D.   On: 06/26/2022 16:24   CT Cervical Spine Wo Contrast  Result Date: 06/26/2022 CLINICAL DATA:  Head and neck trauma. EXAM: CT HEAD WITHOUT CONTRAST CT CERVICAL SPINE WITHOUT CONTRAST TECHNIQUE: Multidetector CT imaging of the head and cervical spine was performed following the standard protocol without intravenous contrast. Multiplanar CT image reconstructions of the cervical spine were also generated. RADIATION DOSE REDUCTION: This exam was performed according to the departmental dose-optimization program which includes automated exposure control, adjustment of the mA and/or kV according to patient size and/or use of iterative reconstruction technique. COMPARISON:  Head CT 05/07/2008 FINDINGS: CT HEAD FINDINGS Brain: Ventricles, cisterns and other CSF spaces are normal. There is chronic ischemic microvascular disease. No mass, mass effect, shift of midline structures or acute hemorrhage. No evidence of acute infarction. Vascular: No hyperdense vessel or unexpected  calcification. Skull: Normal. Negative for fracture or focal lesion. Sinuses/Orbits: No acute finding. Other: None. CT CERVICAL SPINE FINDINGS Alignment: No posttraumatic subluxation. Skull base and vertebrae: Vertebral body heights are maintained. There is mild moderate spondylosis throughout the cervical spine to include uncovertebral joint spurring and facet arthropathy. Mild bilateral neural foraminal narrowing at the C4-5 level with right-sided neural foraminal narrowing at the C5-6 level. Moderate right-sided neural foraminal narrowing at the C6-7 level. No acute fracture. Soft tissues and spinal canal: No prevertebral fluid or swelling. No visible canal hematoma. Disc levels: Moderate disc space narrowing at the C4-5, C5-6 and C6-7 levels. Upper chest: No acute findings. Other: None. IMPRESSION: 1. No acute brain injury. 2. Chronic ischemic microvascular disease. 3. No acute cervical spine injury. 4. Mild-to-moderate spondylosis throughout the cervical spine with disc disease at the C4-5, C5-6 and C6-7 levels. Neural foraminal narrowing as described. Electronically Signed   By: Marin Olp M.D.   On: 06/26/2022 16:24   DG Chest Portable 1 View  Result Date: 06/26/2022 CLINICAL DATA:  Fall from standing. EXAM: PORTABLE CHEST 1 VIEW COMPARISON:  Chest radiograph dated 06/23/2020. FINDINGS: The heart is enlarged. Vascular calcifications are seen in the aortic arch. The lungs are clear. There is no pleural effusion or pneumothorax. Emphysematous changes are noted. Degenerative changes are seen in the spine. IMPRESSION: 1. No active disease. 2. Cardiomegaly. Electronically Signed   By: Zerita Boers M.D.   On: 06/26/2022 16:22   DG Pelvis Portable  Result  Date: 06/26/2022 CLINICAL DATA:  Fall EXAM: PORTABLE PELVIS 1-2 VIEWS COMPARISON:  08/05/2019, 08/03/2019 FINDINGS: SI joints are non widened. Pubic symphysis and rami appear intact. Status post left hip replacement with intact hardware and normal  alignment. No definitive fracture is seen IMPRESSION: Status post left hip replacement. No acute osseous abnormality. Electronically Signed   By: Donavan Foil M.D.   On: 06/26/2022 16:22      LOS: 2 days    Cordelia Poche, MD [facility] 06/28/2022, 10:04 AM   If 7PM-7AM, please contact night-coverage [email or web address]

## 2022-06-28 NOTE — Anesthesia Procedure Notes (Signed)
Anesthesia Regional Block: Femoral nerve block   Pre-Anesthetic Checklist: , timeout performed,  Correct Patient, Correct Site, Correct Laterality,  Correct Procedure, Correct Position, site marked,  Risks and benefits discussed,  Surgical consent,  Pre-op evaluation,  At surgeon's request and post-op pain management  Laterality: Lower and Right  Prep: chloraprep       Needles:  Injection technique: Single-shot  Needle Type: Stimulator Needle - 80     Needle Length: 9cm  Needle Gauge: 22   Needle insertion depth: 6 cm   Additional Needles:   Procedures:, nerve stimulator,,, ultrasound used (permanent image in chart),,     Nerve Stimulator or Paresthesia:  Response: Patellar snap, 0.5 mA  Additional Responses:   Narrative:  Start time: 06/28/2022 11:20 AM End time: 06/28/2022 11:35 AM Injection made incrementally with aspirations every 5 mL.  Performed by: Personally  Anesthesiologist: Nolon Nations, MD  Additional Notes: BP cuff, EKG monitors applied. Sedation begun. Femoral artery palpated for location of nerve. After nerve location verified with U/S, anesthetic injected incrementally, slowly, and after negative aspirations under direct u/s guidance. Good perineural spread. Patient tolerated well.

## 2022-06-28 NOTE — Anesthesia Procedure Notes (Signed)
Procedure Name: MAC Date/Time: 06/28/2022 12:44 PM  Performed by: Valda Favia, CRNAPre-anesthesia Checklist: Patient identified, Emergency Drugs available, Suction available, Patient being monitored and Timeout performed Patient Re-evaluated:Patient Re-evaluated prior to induction Oxygen Delivery Method: Simple face mask Preoxygenation: Pre-oxygenation with 100% oxygen Induction Type: IV induction Airway Equipment and Method: Oral airway Placement Confirmation: positive ETCO2 Dental Injury: Teeth and Oropharynx as per pre-operative assessment

## 2022-06-28 NOTE — Plan of Care (Signed)

## 2022-06-28 NOTE — Progress Notes (Signed)
  Echocardiogram 2D Echocardiogram has been performed.  [name] 06/28/2022, 4:06 PM

## 2022-06-28 NOTE — Anesthesia Procedure Notes (Signed)
Arterial Line Insertion Start/End3/19/2024 10:25 AM Performed by: Darletta Moll, CRNA, CRNA  Patient location: Pre-op. Preanesthetic checklist: patient identified, IV checked, site marked, risks and benefits discussed, surgical consent, monitors and equipment checked, pre-op evaluation, timeout performed and anesthesia consent Lidocaine 1% used for infiltration Left, radial was placed Catheter size: 20 G Hand hygiene performed  and maximum sterile barriers used   Attempts: 1 Procedure performed without using ultrasound guided technique. Following insertion, dressing applied and Biopatch. Post procedure assessment: normal and unchanged  Patient tolerated the procedure well with no immediate complications.

## 2022-06-28 NOTE — Interval H&P Note (Signed)
History and Physical Interval Note:  06/28/2022 12:02 PM  [name]  has presented today for surgery, with the diagnosis of RIGHT TIBIA FRACTURE.  The various methods of treatment have been discussed with the patient and family. After consideration of risks, benefits and other options for treatment, the patient has consented to  Procedure(s): OPEN REDUCTION INTERNAL FIXATION (ORIF) TIBIA (Right) as a surgical intervention.  The patient's history has been reviewed, patient examined, no change in status, stable for surgery.  I have reviewed the patient's chart and labs.  Questions were answered to the patient's satisfaction.     Lennette Bihari P Worth Kober

## 2022-06-28 NOTE — NC FL2 (Signed)
[place] LEVEL OF CARE FORM     IDENTIFICATION  Patient Name: [name] Birthdate: [birth year] Sex: female Admission Date (Current Location): 06/26/2022  [facility] and [place] Number:  Herbalist and Address:  [facility]. [facility], [place] [address], [place], [place] [address]      Provider Number: [id number]  Attending Physician Name and Address:  Mariel Aloe, MD  Relative Name and Phone Number:       Current Level of Care: Hospital Recommended Level of Care: [place] Prior Approval Number:    Date Approved/Denied:   PASRR Number: [id number]  Discharge Plan: SNF    Current Diagnoses: Patient Active Problem List   Diagnosis Date Noted   Tibia/fibula fracture 06/26/2022   Fall at home, initial encounter 06/26/2022   Chronic anticoagulation 06/26/2022   Essential hypertension 06/26/2022   Hyponatremia 06/26/2022   Hypochloremia 06/26/2022   Paresthesia 06/19/2022   PAD (peripheral artery disease) ([place]) 06/19/2022   SOB (shortness of breath) 04/24/2022   Foot pain 04/24/2022   Urine frequency 03/29/2021   Acute cystitis with hematuria 03/29/2021   GERD (gastroesophageal reflux disease) 12/18/2020   Closed right ankle fracture 09/22/2020   Ankle fracture, right 09/22/2020   Recurrent dislocation of patella, right knee 06/16/2020   Primary osteoarthritis of right knee 02/18/2020   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 12/03/2019   Left knee pain 12/03/2019   Moderate to severe pulmonary hypertension ([place]) 11/26/2019   Tremor of both hands 09/29/2019   Dysuria 08/06/2019   Severe tricuspid regurgitation    S/P hip hemiarthroplasty 08/04/2019   Chronic diarrhea 09/07/2017   Hoarseness of voice 09/07/2017   Total knee replacement status, left 04/25/17 05/06/2017   Primary osteoarthritis of left knee 04/25/2017   DJD (degenerative joint disease) 04/25/2017   Healthcare maintenance  02/08/2017   Vitamin D deficiency 02/08/2017   Microscopic colitis 09/27/2016   IBS (irritable bowel syndrome) 08/31/2016   At risk for falling 01/07/2016   Cystitis 07/10/2015   Chronic combined systolic and diastolic heart failure (HCC)    Rectus sheath hematoma- no anticoagulation    Atrial fibrillation with RVR- CHADs VASc=4 06/18/2015   Diarrhea 06/12/2015   Advance care planning 10/23/2014   Leg edema 06/27/2012   Medicare annual wellness visit, subsequent 03/27/2012   Anxiety and depression 03/05/2012   EDEMA 06/07/2010   Osteoporosis 05/23/2010   KNEE PAIN, LEFT 03/02/2010   LBBB (left bundle branch block) 10/28/2008   POST MI SEPTAL DEFECT 06/24/2008   Cough 06/24/2008   Nonischemic cardiomyopathy ([place]) 05/30/2008   ATRIAL FIBRILLATION 05/30/2008   HYPONATREMIA, HX OF 05/30/2008    Orientation RESPIRATION BLADDER Height & Weight     Self, Situation, Time, Place  O2 (3L nasal cannula) Incontinent Weight: 160 lb 15 oz (73 kg) Height:  5\' 5"  (165.1 cm)  BEHAVIORAL SYMPTOMS/MOOD NEUROLOGICAL BOWEL NUTRITION STATUS      Continent Diet  AMBULATORY STATUS COMMUNICATION OF NEEDS Skin   Limited Assist Verbally Surgical wounds (Closed incision on foot and leg)                       Personal Care Assistance Level of Assistance  Bathing, Feeding, Dressing Bathing Assistance: Limited assistance Feeding assistance: Limited assistance Dressing Assistance: Limited assistance     Functional Limitations Info  Sight, Hearing Sight Info: Impaired Hearing Info: Impaired      SPECIAL CARE FACTORS FREQUENCY  PT (By licensed PT), OT (By licensed OT)     PT Frequency: 5x/week OT Frequency: 5x/week            Contractures Contractures Info: Not present    Additional Factors Info  Code Status, Allergies Code Status Info: Full Allergies Info: Ace Inhibitors, Warfarin Sodium, Famotidine, Vancomycin, Codeine, Delsym (Dextromethorphan Polistirex Er), Lactose  Intolerance (Gi), Lasix (Furosemide), Phenylephrine, Sulfamethoxazole-trimethoprim           Current Medications (06/28/2022):  This is the current hospital active medication list Current Facility-Administered Medications  Medication Dose Route Frequency Provider Last Rate Last Admin   [START ON 06/29/2022] acetaminophen (TYLENOL) tablet 325-650 mg  325-650 mg Oral Q6H PRN Corinne Ports, PA-C       amiodarone (NEXTERONE PREMIX) 360-4.14 MG/200ML-% (1.8 mg/mL) IV infusion  30 mg/hr Intravenous Continuous Hart [place] [id number] mL/hr at 06/28/22 1251 30 mg/hr at 06/28/22 1251   ceFAZolin (ANCEF) IVPB 2g/100 mL premix  2 g Intravenous Q8H McClung, Sarah A, PA-C       digoxin (LANOXIN) tablet 0.0625 mg  0.0625 mg Oral Daily Corinne Ports, PA-C   0.0625 mg at 06/28/22 [id number]   HYDROcodone-acetaminophen (NORCO/VICODIN) 5-325 MG per tablet 1-2 tablet  1-2 tablet Oral Q6H PRN Corinne Ports, PA-C       metoCLOPramide (REGLAN) tablet 5-10 mg  5-10 mg Oral Q8H PRN Thereasa Solo, Sarah A, PA-C       Or   metoCLOPramide (REGLAN) injection 5-10 mg  5-10 mg Intravenous Q8H PRN Corinne Ports, PA-C       metoprolol succinate (TOPROL-XL) 24 hr tablet 150 mg  150 mg Oral Daily Corinne Ports, PA-C   150 mg at 06/28/22 [id number]   morphine (PF) 2 MG/ML injection 0.5-1 mg  0.5-1 mg Intravenous Q2H PRN Corinne Ports, PA-C       ondansetron (ZOFRAN) tablet 4 mg  4 mg Oral Q6H PRN Corinne Ports, PA-C       Or   ondansetron (ZOFRAN) injection 4 mg  4 mg Intravenous Q6H PRN McClung, Sarah A, PA-C       pantoprazole (PROTONIX) EC tablet 20 mg  20 mg Oral Daily McClung, Sarah A, PA-C       senna (SENOKOT) tablet 8.6 mg  1 tablet Oral BID Corinne Ports, PA-C       torsemide (DEMADEX) tablet 10 mg  10 mg Oral Daily Rushie Nyhan A, PA-C   10 mg at 06/27/22 1809   venlafaxine XR (EFFEXOR-XR) 24 hr capsule 75 mg  75 mg Oral q morning Corinne Ports, PA-C   75 mg at 06/28/22 [id number]     Discharge  Medications: Please see discharge summary for a list of discharge medications.  Relevant Imaging Results:  Relevant Lab Results:   Additional Information SS#- [id number];  Benard Halsted, LCSW

## 2022-06-28 NOTE — Op Note (Signed)
Orthopaedic Surgery Operative Note (CSN: [id number] ) Date of Surgery: 06/28/2022  Admit Date: 06/26/2022   Diagnoses: Pre-Op Diagnoses: Right periprosthetic proximal tibia fracture  Post-Op Diagnosis: Same  Procedures: CPT [id number] reduction internal fixation right proximal tibia fracture   Surgeons : Primary: Shona Needles, MD  Assistant: Ainsley Spinner, PA-C  Location: OR 3   Anesthesia:Regional block   Antibiotics: Ancef 2g preop with 1 gm vancomycin powder placed topically   Tourniquet time: None    Estimated Blood Loss: 40 mL  Complications:None   Specimens:None   Implants: Implant Name Type Inv. Item Serial No. Manufacturer Lot No. LRB No. Used Action  PLATE LOCK LAT 579FGE RT 13H - [id number] Plate PLATE LOCK LAT 579FGE RT 13H  [employer](ORTH,TRAU,BIO,SG) [id number] Right 1 Implanted  SCREW NCB 4.0 28MM - [id number] Screw SCREW NCB 4.0 28MM  [employer](ORTH,TRAU,BIO,SG)  Right 3 Implanted  SCREW NCB 4.0MX30M - [id number] Screw SCREW NCB 4.0MX30M  [employer](ORTH,TRAU,BIO,SG)  Right 1 Implanted  SCREW NCB 4.0MX65M - [id number] Screw SCREW NCB 4.0MX65M  [employer](ORTH,TRAU,BIO,SG)  Right 1 Implanted  SCREW NCB 4X3 4X70 - [id number] Screw SCREW NCB 4X3 4X70  [employer](ORTH,TRAU,BIO,SG)  Right 1 Implanted  SCREW NCB 4.0X40MM - [id number] Screw SCREW NCB 4.0X40MM  [employer](ORTH,TRAU,BIO,SG)  Right 1 Implanted  SCREW NCB 4.0MX50M [id number] Screw SCREW NCB 4.0MX50M  [employer](ORTH,TRAU,BIO,SG)  Right 1 Implanted  SCREW HUM NCB PA ST 4X60 - [id number] Screw SCREW HUM NCB PA ST 4X60  [employer](ORTH,TRAU,BIO,SG)  Right 1 Implanted  CAP LOCK NCB - [id number] Cap CAP LOCK NCB  [employer](ORTH,TRAU,BIO,SG)  Right 7 Implanted     Indications for Surgery: 89 year old female who sustained a ground-level fall with a right periprosthetic proximal tibia fracture.  Due to the unstable nature of her injury I recommend proceeding with open reduction internal fixation.   Risk and benefits were discussed with the patient and her daughter.  Risks included but not limited to bleeding, infection, malunion, nonunion, hardware failure, hardware irritation, nerve or blood vessel injury, DVT, even the possibility anesthetic complications.  They agreed to proceed with surgery and consent was obtained.  Operative Findings: Open reduction internal fixation of right proximal tibia fracture using [employer] NCB 13 hole proximal tibial locking plate  Procedure: The patient was identified in the preoperative holding area. Consent was confirmed with the patient and their family and all questions were answered. The operative extremity was marked after confirmation with the patient. she was then brought back to the operating room by our anesthesia colleagues.  She was carefully transferred over to radiolucent flattop table.  She was placed under anesthesia.  Right lower extremity was then prepped and draped in usual sterile fashion.  A timeout was performed to verify the patient, the procedure, and the extremity.  Preoperative antibiotics were dosed.  Traction was applied gently and fluoroscopic imaging showed the fracture of the proximal tibia.  A small incision at the proximal tibia was made and carried down through skin and subcutaneous tissue.  I incised through the IT band to expose the lateral condyle of the proximal tibia.  I then used a Cobb elevator to create a path along the lateral cortex of the tibia for plate placement.  I decided to use a 13 hole [employer] NCB proximal tibial locking plate and attach it to a targeting arm.  I then slid that submuscularly along the lateral cortex of the tibia.  I positioned the plate proximally with a K wire  and then used a 3.3 mm drill bit distally to align the distal portion of the plate.  Once I confirmed alignment I then drilled and placed nonlocking screws anterior and posterior to the prosthesis.  This was able to bring the plate  flush to bone.  I then percutaneously placed 4.0 millimeter screws into the tibial shaft distal to the fracture.  A total of 4 screws were placed.  Locking caps were placed on the middle to distal screws.  I then percutaneously placed a 4.0 millimeter screw proximal to the fracture and placed a locking cap on this.  I then removed the targeting arm and placed 2 more screws anterior to the prosthesis and placed locking caps on all of the proximal screws.  Final fluoroscopic imaging was obtained.  The incision was copiously irrigated.  A gram of vancomycin powder was placed into the incision.  A layered closure of 0 Vicryl, 2-0 Vicryl and 3-0 nylon was used to close the skin.  Sterile dressings were applied.  The patient was then awoken from anesthesia and taken to the PACU in stable condition.  Post Op Plan/Instructions: The patient will be weightbearing for transfers to the right lower extremity.  She will receive postoperative Ancef.  She may be restarted on her DOAC postoperative day 1 if her hemoglobin is stable.  We will have her mobilize with physical and Occupational Therapy.  I was present and performed the entire surgery.  Ainsley Spinner, PA-C did assist me throughout the case. An assistant was necessary given the difficulty in approach, maintenance of reduction and ability to instrument the fracture.   Katha Hamming, MD [facility]

## 2022-06-28 NOTE — H&P (View-Only) (Signed)
Orthopaedic Trauma Service (OTS) Consult   Patient ID: [name] MRN: [id number] DOB/AGE: Sep 15, 1930 89 y.o.  Reason for Consult:Right tibia fracture Referring Physician: Dr. Georgeanna Harrison, MD [name]  HPI: [name] is an 89 y.o. female who is being seen in consultation at the request of Dr. Mable Fill for evaluation of right periprosthetic tibia fracture.  Patient had a ground-level fall sustaining a right periprosthetic proximal tibia fracture.  Due to the unstable nature of her injury and the complexity of the injury Dr. Mable Fill felt that this was outside the scope of practice and required open reduction internal fixation by orthopedic trauma surgery.  Patient was seen and evaluated in the preoperative holding area.  Discussed with her daughter who is at bedside as well.  Patient lives with her daughter and ambulates with a rolling walker at baseline.  Does have a heart history and had atrial fibrillation with RVR yesterday which has come down and is appropriately managed.  Cardiology has seen her.  She is otherwise doing okay.  Notes that she is having pain in her right lower extremity.  Past Medical History:  Diagnosis Date   Acute combined systolic and diastolic heart failure ([place]) 05/02/2017   Anemia    Anxiety    Arthritis    "knees" (06/18/2015)   Atrial fibrillation with RVR ([place]) 06/18/2015   h/o sig bleed on coumadin   Cardiogenic shock [id number]   Complication of anesthesia 2010   "w/cardiac cath; got into a psychotic state for ~ 3 days; got me out w/Valium"   Depression    "I have it off and on; not as often as I've gotten older" (06/18/2015)   Diverticulosis    descending and sigmoid colon--Dr. Sharlett Iles   DJD (degenerative joint disease) of knee    left   Dyspnea    with exertion and in morning mostly when wakes up    E coli infection    History of   GERD (gastroesophageal reflux disease)    History of blood transfusion    History of Clostridium  difficile colitis    IBS (irritable bowel syndrome)    Insomnia     off chronic ambien 5mg  as of 10/11, rare/episodic use since then  DCM/CHF   LBBB (left bundle branch block)    Lymphocytic colitis 09/27/2016   Moderate to severe pulmonary hypertension ([place]) 11/26/2019   Nonischemic cardiomyopathy ([place])    normal coronaries, EF 15-20% 04/2008, EF 50-55% 2015   Osteoporosis    on DXA 05/2010   Positive PPD    Retroperitoneal bleed    Sciatica    Right   Urinary incontinence    Occassionally    Past Surgical History:  Procedure Laterality Date   ANTERIOR APPROACH HEMI HIP ARTHROPLASTY Left 08/05/2019   Procedure: ANTERIOR APPROACH HEMI HIP ARTHROPLASTY;  Surgeon: Dorna Leitz, MD;  Location: [facility];  Service: Orthopedics;  Laterality: Left;   BIOPSY  11/22/2017   Procedure: BIOPSY;  Surgeon: Gatha Mayer, MD;  Location: [facility];  Service: Endoscopy;;   CARDIAC CATHETERIZATION  2010   CARDIOVERSION N/A 06/19/2015   Procedure: CARDIOVERSION;  Surgeon: Larey Dresser, MD;  Location: [facility];  Service: Cardiovascular;  Laterality: N/A;   CATARACT EXTRACTION W/ INTRAOCULAR LENS  IMPLANT, BILATERAL Bilateral    COLONOSCOPY WITH PROPOFOL N/A 11/22/2017   Procedure: COLONOSCOPY WITH PROPOFOL;  Surgeon: Gatha Mayer, MD;  Location: [facility];  Service: Endoscopy;  Laterality: N/A;   DILATION AND CURETTAGE OF  UTERUS     ORIF ANKLE FRACTURE Right 09/22/2020   Procedure: RIGHT ANKLE OPEN REDUCTION INTERNAL FIXATION (ORIF);  Surgeon: Melrose Nakayama, MD;  Location: [facility];  Service: Orthopedics;  Laterality: Right;   TEE WITHOUT CARDIOVERSION N/A 06/19/2015   Procedure: TRANSESOPHAGEAL ECHOCARDIOGRAM (TEE);  Surgeon: Larey Dresser, MD;  Location: [facility];  Service: Cardiovascular;  Laterality: N/A;   TOTAL KNEE ARTHROPLASTY Left 04/25/2017   Procedure: TOTAL KNEE ARTHROPLASTY;  Surgeon: Melrose Nakayama, MD;  Location: [facility];  Service: Orthopedics;  Laterality: Left;   TOTAL  KNEE ARTHROPLASTY Right 02/18/2020   Procedure: RIGHT TOTAL KNEE ARTHROPLASTY;  Surgeon: Melrose Nakayama, MD;  Location: [facility];  Service: Orthopedics;  Laterality: Right;   TOTAL KNEE ARTHROPLASTY Right 06/16/2020   Procedure: RIGHT RETINACULAR REPAIR;  Surgeon: Melrose Nakayama, MD;  Location: [facility];  Service: Orthopedics;  Laterality: Right;   TOTAL KNEE REVISION Left 12/03/2019   Procedure: LEFT TOTAL KNEE REVISION  OF POYLY EXCHANGE;  Surgeon: Melrose Nakayama, MD;  Location: [facility];  Service: Orthopedics;  Laterality: Left;   VAGINAL HYSTERECTOMY  1979   ovaries intact    Family History  Problem Relation Age of Onset   Colon cancer Father        possible colon cancer   Other Father        esophagus tumor?   Tuberculosis Mother    Breast cancer Neg Hx    Stomach cancer Neg Hx    Pancreatic cancer Neg Hx     Social History:  reports that she has never smoked. She has never used smokeless tobacco. She reports that she does not drink alcohol and does not use drugs.  Allergies:  Allergies  Allergen Reactions   Ace Inhibitors Cough   Warfarin Sodium Other (See Comments)    DOSE RELATED PHARMACOLOGIC EFFECT "bleed out"   Famotidine Other (See Comments)    GI upset   Vancomycin Nausea Only   Codeine Other (See Comments)    sedation   Delsym [Dextromethorphan Polistirex Er] Other (See Comments)    dizziness   Lactose Intolerance (Gi) Diarrhea   Lasix [Furosemide] Diarrhea    Oral Lasix gives her diarrhea, tolerates IV Rx   Phenylephrine Palpitations and Other (See Comments)    Nasal spray- "likely increase in nasal congestion".    Sulfamethoxazole-Trimethoprim Nausea And Vomiting    GI intolerance.    Medications:  No current facility-administered medications on file prior to encounter.   Current Outpatient Medications on File Prior to Encounter  Medication Sig Dispense Refill   acetaminophen (TYLENOL) 500 MG tablet Take 1,000 mg by mouth in the morning and at bedtime.      apixaban (ELIQUIS) 2.5 MG TABS tablet TAKE 1 TABLET BY MOUTH TWICE A DAY 180 tablet 1   calcium carbonate (TUMS - DOSED IN MG ELEMENTAL CALCIUM) 500 MG chewable tablet Chew 500 mg by mouth 2 (two) times daily as needed for indigestion or heartburn.     cholecalciferol (VITAMIN D3) 25 MCG (1000 UNIT) tablet Take 1,000 Units by mouth in the morning.     cholestyramine (QUESTRAN) 4 g packet TAKE 1 PACKET (4 G TOTAL) BY MOUTH DAILY WITH SUPPER. 90 packet 1   Cranberry 180 MG CAPS Take 180 mg by mouth daily in the afternoon.     digoxin (LANOXIN) 0.125 MG tablet 1/4 tab by mouth daily. (Patient taking differently: Take 0.0625 mg by mouth daily.) 45 tablet 3   fluticasone (FLONASE) 50 MCG/ACT nasal spray Place  2 sprays into both nostrils daily. 16 g 6   KLOR-CON M20 20 MEQ tablet TAKE 2 TABLETS BY MOUTH DAILY 180 tablet 1   loperamide (IMODIUM) 2 MG capsule Take 2 mg by mouth 3 (three) times daily as needed for diarrhea or loose stools.      loratadine (CLARITIN) 10 MG tablet Take 10 mg by mouth daily.     metoprolol succinate (TOPROL-XL) 100 MG 24 hr tablet Take 1.5 tablets (150 mg total) by mouth daily.     pantoprazole (PROTONIX) 20 MG tablet Take 1 tablet (20 mg total) by mouth daily. 30 tablet 3   torsemide (DEMADEX) 10 MG tablet Take 1 tablet (10 mg total) by mouth daily. Dry weight is 149. If you are above this, take 20mg  torsemide. 135 tablet 3   traMADol (ULTRAM) 50 MG tablet Take 1 tablet (50 mg total) by mouth 3 (three) times daily as needed. (Patient taking differently: Take 50 mg by mouth 3 (three) times daily as needed for moderate pain or severe pain.) 15 tablet 1   venlafaxine XR (EFFEXOR-XR) 37.5 MG 24 hr capsule Take 2 capsules (75 mg total) by mouth every morning. 180 capsule 1     ROS: Constitutional: No fever or chills Vision: No changes in vision ENT: No difficulty swallowing CV: No chest pain Pulm: No SOB or wheezing GI: No nausea or vomiting GU: No urgency or inability to  hold urine Skin: No poor wound healing Neurologic: No numbness or tingling Psychiatric: No depression or anxiety Heme: No bruising Allergic: No reaction to medications or food   Exam: Blood pressure (!) 110/58, pulse 84, temperature 98.1 F (36.7 C), temperature source Oral, resp. rate 16, height 5\' 5"  (1.651 m), weight 73 kg, SpO2 98 %. General: No acute distress Orientation: Awake alert and oriented x 3 Mood and Affect: Cooperative and pleasant Gait: Unable to assess due to her fracture Coordination and balance: Within normal limits  Right lower extremity: Long-leg splint is in place is clean dry and intact.  Compartments are soft and compressible.  Active dorsiflexion plantarflexion of the toes.  She has sensation intact to light touch with brisk cap refill less than 2 seconds.  Left lower extremity: Skin without lesions. No tenderness to palpation. Full painless ROM, full strength in each muscle groups without evidence of instability.   Medical Decision Making: Data: Imaging: Right periprosthetic proximal tibia fracture with extension down into the tibial shaft.  Labs:  Results for orders placed or performed during the hospital encounter of 06/26/22 (from the past 24 hour(s))  Basic metabolic panel     Status: Abnormal   Collection Time: 06/28/22  3:36 AM  Result Value Ref Range   Sodium 134 (L) 135 - 145 mmol/L   Potassium 3.9 3.5 - 5.1 mmol/L   Chloride 97 (L) 98 - 111 mmol/L   CO2 27 22 - 32 mmol/L   Glucose, Bld 113 (H) 70 - 99 mg/dL   BUN 24 (H) 8 - 23 mg/dL   Creatinine, Ser 1.02 (H) 0.44 - 1.00 mg/dL   Calcium 8.9 8.9 - 10.3 mg/dL   GFR, Estimated 53 (L) >60 mL/min   Anion gap 10 5 - 15  Magnesium     Status: None   Collection Time: 06/28/22  3:36 AM  Result Value Ref Range   Magnesium 1.8 1.7 - 2.4 mg/dL     Imaging or Labs ordered: None  Medical history and chart was reviewed and case discussed with medical provider.  Assessment/Plan: 89 year old  female with right periprosthetic proximal tibia fracture.  Due to the unstable nature of her injury I recommend proceeding with open reduction internal fixation.  Risks and benefits were discussed with the patient and her daughter.  Risks included but not limited to bleeding, infection, malunion, nonunion, hardware failure, hardware rotation, nerve or blood vessel injury, DVT, even the possibility anesthetic complications.  They agreed to proceed with surgery and consent was obtained.  Shona Needles, MD [facility] [phone] (office) [email or web address]

## 2022-06-28 NOTE — Anesthesia Procedure Notes (Signed)
Anesthesia Regional Block: Sciatic   Pre-Anesthetic Checklist: , timeout performed,  Correct Patient, Correct Site, Correct Laterality,  Correct Procedure, Correct Position, site marked,  Risks and benefits discussed,  Surgical consent,  Pre-op evaluation,  At surgeon's request and post-op pain management  Laterality: Upper and Right  Prep: chloraprep       Needles:  Injection technique: Single-shot  Needle Type: Stimiplex          Additional Needles:   Procedures:, nerve stimulator,,, ultrasound used (permanent image in chart),,     Nerve Stimulator or Paresthesia:  Response: Foot eversion, 0.8 mA  Additional Responses:   Narrative:  Start time: 06/28/2022 11:35 AM End time: 06/28/2022 11:49 AM Injection made incrementally with aspirations every 5 mL.  Performed by: Personally  Anesthesiologist: Nolon Nations, MD  Additional Notes: BP cuff, SpO2 and EKG monitors applied. Sedation begun. Nerve location verified with ultrasound. Anesthetic injected incrementally, slowly, and after neg aspirations under direct u/s guidance. Good perineural spread. Tolerated well.

## 2022-06-28 NOTE — Transfer of Care (Signed)
Immediate Anesthesia Transfer of Care Note  Patient: [name]  Procedure(s) Performed: OPEN REDUCTION INTERNAL FIXATION (ORIF) TIBIA (Right: Leg Lower)  Patient Location: PACU  Anesthesia Type:MAC and Regional  Level of Consciousness: drowsy  Airway & Oxygen Therapy: Patient Spontanous Breathing and Patient connected to face mask oxygen  Post-op Assessment: Report given to RN and Post -op Vital signs reviewed and stable  Post vital signs: Reviewed and stable  Last Vitals:  Vitals Value Taken Time  BP 128/67 06/28/22 1345  Temp    Pulse 68 06/28/22 1347  Resp 18 06/28/22 1347  SpO2 100 % 06/28/22 1347  Vitals shown include unvalidated device data.  Last Pain:  Vitals:   06/28/22 1003  TempSrc: Oral  PainSc: 0-No pain         Complications: No notable events documented.

## 2022-06-29 ENCOUNTER — Encounter (HOSPITAL_COMMUNITY): Payer: Self-pay | Admitting: Student

## 2022-06-29 DIAGNOSIS — Z7901 Long term (current) use of anticoagulants: Secondary | ICD-10-CM | POA: Diagnosis not present

## 2022-06-29 DIAGNOSIS — W19XXXA Unspecified fall, initial encounter: Secondary | ICD-10-CM | POA: Diagnosis not present

## 2022-06-29 DIAGNOSIS — S82201A Unspecified fracture of shaft of right tibia, initial encounter for closed fracture: Secondary | ICD-10-CM | POA: Diagnosis not present

## 2022-06-29 DIAGNOSIS — I4811 Longstanding persistent atrial fibrillation: Secondary | ICD-10-CM | POA: Diagnosis not present

## 2022-06-29 LAB — BASIC METABOLIC PANEL
Anion gap: 10 (ref 5–15)
BUN: 21 mg/dL (ref 8–23)
CO2: 25 mmol/L (ref 22–32)
Calcium: 8.7 mg/dL — ABNORMAL LOW (ref 8.9–10.3)
Chloride: 96 mmol/L — ABNORMAL LOW (ref 98–111)
Creatinine, Ser: 0.75 mg/dL (ref 0.44–1.00)
GFR, Estimated: >60 mL/min (ref >60)
Glucose, Bld: 144 mg/dL — ABNORMAL HIGH (ref 70–99)
Potassium: 4 mmol/L (ref 3.5–5.1)
Sodium: 131 mmol/L — ABNORMAL LOW (ref 135–145)

## 2022-06-29 LAB — CBC
HCT: 28.6 % — ABNORMAL LOW (ref 36.0–46.0)
Hemoglobin: 9.1 g/dL — ABNORMAL LOW (ref 12.0–15.0)
MCH: 30.3 pg (ref 26.0–34.0)
MCHC: 31.8 g/dL (ref 30.0–36.0)
MCV: 95.3 fL (ref 80.0–100.0)
Platelets: 180 10*3/uL (ref 150–400)
RBC: 3 MIL/uL — ABNORMAL LOW (ref 3.87–5.11)
RDW: 12.4 % (ref 11.5–15.5)
WBC: 9.3 10*3/uL (ref 4.0–10.5)
nRBC: 0 % (ref 0.0–0.2)

## 2022-06-29 LAB — VITAMIN D 25 HYDROXY (VIT D DEFICIENCY, FRACTURES): Vit D, 25-Hydroxy: 75.84 ng/mL (ref 30–100)

## 2022-06-29 MED ORDER — METOPROLOL TARTRATE 25 MG PO TABS: 25.0000 mg | ORAL_TABLET | Freq: Two times a day (BID) | ORAL | Status: DC | PRN

## 2022-06-29 MED ORDER — POLYETHYLENE GLYCOL 3350 17 G PO PACK
17.0000 g | PACK | Freq: Every day | ORAL | Status: DC
  Administered 2022-06-29 – 2022-06-30 (×2): 17 g via ORAL
  Filled 2022-06-29 (×3): qty 1

## 2022-06-29 MED ORDER — APIXABAN 2.5 MG PO TABS
2.5000 mg | ORAL_TABLET | Freq: Two times a day (BID) | ORAL | Status: DC
  Administered 2022-06-29 – 2022-07-01 (×5): 2.5 mg via ORAL
  Filled 2022-06-29 (×5): qty 1

## 2022-06-29 MED ORDER — HYALURONIDASE HUMAN 150 UNIT/ML IJ SOLN
150.0000 [IU] | Freq: Once | INTRAMUSCULAR | Status: AC
  Administered 2022-06-29: 150 [IU] via SUBCUTANEOUS
  Filled 2022-06-29: qty 1

## 2022-06-29 MED ORDER — DOCUSATE SODIUM 100 MG PO CAPS
100.0000 mg | ORAL_CAPSULE | Freq: Two times a day (BID) | ORAL | Status: DC
  Administered 2022-06-29 – 2022-06-30 (×3): 100 mg via ORAL
  Filled 2022-06-29 (×4): qty 1

## 2022-06-29 NOTE — Progress Notes (Signed)
Primary RN Delana Meyer, went to discontinue pt's Amiodarone continuous infusion at 1325, when pt's right AC PIV appeared infiltrated. Since IV Amiodarone is a vesicant, charge RN Roderic Palau and AMD Dr. Flora Lipps, MD was notified. Dr. Louanne Belton ordered one time dose of Hyaluronidase (Hylenex) 150 units to be administered subcutaneously to site of infiltration. SQ Hyaluronidase administered at 1552, with a warm compress added to site, and a pillow for elevation.   22 G right hand PIV started leaking blood s/p last dose of IV Ancef. PIV removed and an IV team consult placed. RN will continue to monitor pt.

## 2022-06-29 NOTE — Progress Notes (Addendum)
Progress Note  Patient Name: [name] Date of Encounter: 06/29/2022  Primary Cardiologist: Sanda Klein, MD  Subjective   She is delighted that she feels great today. No CP, SOB, palpitations. Though amiodarone listed on MAR, not presently running at bedside. Confirmed with nurse briefly turned off by ortho APP when trying to quiet alarm. Amio gtt has been resumed.  Inpatient Medications    Scheduled Meds:  apixaban  2.5 mg Oral BID   digoxin  0.0625 mg Oral Daily   metoprolol succinate  150 mg Oral Daily   pantoprazole  20 mg Oral Daily   senna  1 tablet Oral BID   torsemide  10 mg Oral Daily   venlafaxine XR  75 mg Oral q morning   Continuous Infusions:  amiodarone 30 mg/hr (06/29/22 0310)    ceFAZolin (ANCEF) IV 2 g (06/29/22 0317)   PRN Meds: acetaminophen, HYDROcodone-acetaminophen, metoCLOPramide **OR** metoCLOPramide (REGLAN) injection, morphine injection, ondansetron **OR** ondansetron (ZOFRAN) IV   Vital Signs    Vitals:   06/28/22 1645 06/28/22 1700 06/28/22 2128 06/29/22 0500  BP: (!) 110/59 102/67 120/80   Pulse: 70 72 82 81  Resp:  16 18 18   Temp:   (!) 97.4 F (36.3 C) (!) 97.5 F (36.4 C)  TempSrc:   Oral Oral  SpO2:  95%    Weight:    74.1 kg  Height:        Intake/Output Summary (Last 24 hours) at 06/29/2022 0825 Last data filed at 06/28/2022 1748 Gross per 24 hour  Intake 240 ml  Output 340 ml  Net -100 ml      06/29/2022    5:00 AM 06/28/2022    5:00 AM 06/26/2022    3:23 PM  Last 3 Weights  Weight (lbs) 163 lb 5.8 oz 160 lb 15 oz 150 lb  Weight (kg) 74.1 kg 73 kg 68.04 kg     Telemetry    AFib rates 80s-90s predominantly - Personally Reviewed  ECG    No new tracings - Personally Reviewed  Physical Exam   GEN: No acute distress.  HEENT: Normocephalic, atraumatic, sclera non-icteric. Neck: No JVD or bruits. Cardiac: Irregularly irregular no murmurs, rubs, or gallops.  Respiratory: DIminished at bases bilaterally,  otherwise no rales, wheezing or rhonchi. Breathing is unlabored. GI: Soft, nontender, non-distended, BS +x 4. MS: no deformity. Extremities: No clubbing or cyanosis. No edema LLE. Mild edema of toes RLE (wrapped). Distal pedal pulses are 2+ and equal bilaterally. Neuro:  AAOx3. Follows commands. Psych:  Responds to questions appropriately with a normal affect.  Labs    High Sensitivity Troponin:  No results for input(s): "TROPONINIHS" in the last 720 hours.    Cardiac EnzymesNo results for input(s): "TROPONINI" in the last 168 hours. No results for input(s): "TROPIPOC" in the last 168 hours.   Chemistry Recent Labs  Lab 06/27/22 0454 06/28/22 0336 06/29/22 0251  NA 135 134* 131*  K 4.5 3.9 4.0  CL 100 97* 96*  CO2 28 27 25   GLUCOSE 133* 113* 144*  BUN 21 24* 21  CREATININE 0.86 1.02* 0.75  CALCIUM 9.0 8.9 8.7*  GFRNONAA >60 53* >60  ANIONGAP 7 10 10      Hematology Recent Labs  Lab 06/26/22 1505 06/27/22 0454 06/29/22 0251  WBC 9.8 9.1 9.3  RBC 4.05 3.85* 3.00*  HGB 12.5 11.7* 9.1*  HCT 38.6 35.9* 28.6*  MCV 95.3 93.2 95.3  MCH 30.9 30.4 30.3  MCHC 32.4 32.6 31.8  RDW  12.3 12.5 12.4  PLT 255 248 180    BNP Recent Labs  Lab 06/26/22 1505  BNP 490.6*     DDimer No results for input(s): "DDIMER" in the last 168 hours.   Radiology    ECHOCARDIOGRAM COMPLETE  Result Date: 06/28/2022    ECHOCARDIOGRAM REPORT   Patient Name:   [name] Date of Exam: 06/28/2022 Medical Rec #:  [id number]       Height:       65.0 in Accession #:    [id number]      Weight:       160.9 lb Date of Birth:  09-14-1930       BSA:          1.804 m Patient Age:    89 years        BP:           104/52 mmHg Patient Gender: F               HR:           61 bpm. Exam Location:  Inpatient Procedure: 2D Echo Indications:    atrial fibrillation  History:        Patient has prior history of Echocardiogram examinations, most                 recent 06/24/2020. Arrythmias:LBBB; Risk  Factors:Hypertension.  Sonographer:    Johny Chess RDCS Referring Phys: [place]  1. Left ventricular ejection fraction, by estimation, is 25 to 30%. The left ventricle has severely decreased function. The left ventricle demonstrates global hypokinesis with septal-lateral dyssynchrony consistent with LBBB. Left ventricular diastolic parameters are indeterminate.  2. Right ventricular systolic function is mildly reduced. The right ventricular size is normal. There is mildly elevated pulmonary artery systolic pressure. The estimated right ventricular systolic pressure is 123XX123 mmHg.  3. Left atrial size was moderately dilated.  4. Right atrial size was mildly dilated.  5. The mitral valve is normal in structure. No evidence of mitral valve regurgitation. No evidence of mitral stenosis.  6. The aortic valve is tricuspid. There is mild calcification of the aortic valve. Aortic valve regurgitation is mild. No aortic stenosis is present.  7. The inferior vena cava is normal in size with greater than 50% respiratory variability, suggesting right atrial pressure of 3 mmHg.  8. The patient was in atrial fibrillation. FINDINGS  Left Ventricle: Left ventricular ejection fraction, by estimation, is 25 to 30%. The left ventricle has severely decreased function. The left ventricle demonstrates global hypokinesis. The left ventricular internal cavity size was normal in size. There is no left ventricular hypertrophy. Left ventricular diastolic parameters are indeterminate. Right Ventricle: The right ventricular size is normal. No increase in right ventricular wall thickness. Right ventricular systolic function is mildly reduced. There is mildly elevated pulmonary artery systolic pressure. The tricuspid regurgitant velocity  is 2.93 m/s, and with an assumed right atrial pressure of 3 mmHg, the estimated right ventricular systolic pressure is 123XX123 mmHg. Left Atrium: Left atrial size was moderately dilated.  Right Atrium: Right atrial size was mildly dilated. Pericardium: There is no evidence of pericardial effusion. Mitral Valve: The mitral valve is normal in structure. No evidence of mitral valve regurgitation. No evidence of mitral valve stenosis. Tricuspid Valve: The tricuspid valve is normal in structure. Tricuspid valve regurgitation is mild. Aortic Valve: The aortic valve is tricuspid. There is mild calcification of the aortic valve. Aortic valve regurgitation is mild.  No aortic stenosis is present. Pulmonic Valve: The pulmonic valve was normal in structure. Pulmonic valve regurgitation is not visualized. Aorta: The aortic root is normal in size and structure. Venous: The inferior vena cava is normal in size with greater than 50% respiratory variability, suggesting right atrial pressure of 3 mmHg. IAS/Shunts: No atrial level shunt detected by color flow Doppler.  LEFT VENTRICLE PLAX 2D LVIDd:         5.00 cm LVIDs:         4.10 cm LV PW:         0.80 cm LV IVS:        1.00 cm LVOT diam:     1.80 cm LV SV:         39 LV SV Index:   22 LVOT Area:     2.54 cm  RIGHT VENTRICLE          IVC RV Basal diam:  2.80 cm  IVC diam: 1.40 cm TAPSE (M-mode): 1.2 cm LEFT ATRIUM             Index        RIGHT ATRIUM           Index LA diam:        4.80 cm 2.66 cm/m   RA Area:     19.20 cm LA Vol (A2C):   98.5 ml 54.61 ml/m  RA Volume:   51.00 ml  28.28 ml/m LA Vol (A4C):   80.6 ml 44.69 ml/m LA Biplane Vol: 88.9 ml 49.29 ml/m  AORTIC VALVE LVOT Vmax:   77.30 cm/s LVOT Vmean:  51.700 cm/s LVOT VTI:    0.154 m  AORTA Ao Root diam: 3.20 cm Ao Asc diam:  3.40 cm TRICUSPID VALVE TR Peak grad:   34.3 mmHg TR Vmax:        293.00 cm/s  SHUNTS Systemic VTI:  0.15 m Systemic Diam: 1.80 cm Dalton McleanMD Electronically signed by Franki Monte Signature Date/Time: 06/28/2022/4:21:44 PM    Final    DG Tibia/Fibula Right Port  Result Date: 06/28/2022 CLINICAL DATA:  Right tibial ORIF EXAM: PORTABLE RIGHT TIBIA AND FIBULA - 2 VIEW  COMPARISON:  06/26/2022 FINDINGS: Interval ORIF of comminuted proximal tibial diaphyseal fracture with lateral sideplate and screw fixation construct. Improved fracture alignment. Fibular diaphyseal fracture alignment is also improved. Expected postoperative changes within the overlying soft tissues. IMPRESSION: Interval ORIF of comminuted tibial diaphyseal fracture with improved fracture alignment. Electronically Signed   By: Davina Poke D.O.   On: 06/28/2022 14:35   DG Tibia/Fibula Right  Result Date: 06/28/2022 CLINICAL DATA:  Fluoroscopic assistance for internal fixation EXAM: RIGHT TIBIA AND FIBULA - 2 VIEW COMPARISON:  06/26/2022 FINDINGS: Fluoroscopic images show internal fixation of comminuted fracture of shaft of right tibia with metallic plate and multiple screws. There is previous right knee arthroplasty. Fluoroscopy time 55 seconds. Radiation dose 1.68 mGy. IMPRESSION: Fluoroscopic assistance was provided for internal fixation of fracture of right tibia. Electronically Signed   By: Elmer Picker M.D.   On: 06/28/2022 13:46   DG C-Arm 1-60 Min-No Report  Result Date: 06/28/2022 Fluoroscopy was utilized by the requesting physician.  No radiographic interpretation.   DG Foot Complete Right  Result Date: 06/27/2022 CLINICAL DATA:  Right foot pain, initial encounter EXAM: RIGHT FOOT COMPLETE - 3+ VIEW COMPARISON:  None Available. FINDINGS: Postsurgical changes are noted in the distal tibia and fibula. Mildly angulated fracture of the first proximal phalanx is noted. No other fractures  are seen. Splinting material is noted in place. IMPRESSION: First proximal phalangeal fracture. Electronically Signed   By: Inez Catalina M.D.   On: 06/27/2022 16:12    Cardiac Studies   2d echo 06/28/22    1. Left ventricular ejection fraction, by estimation, is 25 to 30%. The  left ventricle has severely decreased function. The left ventricle  demonstrates global hypokinesis with septal-lateral  dyssynchrony  consistent with LBBB. Left ventricular diastolic  parameters are indeterminate.   2. Right ventricular systolic function is mildly reduced. The right  ventricular size is normal. There is mildly elevated pulmonary artery  systolic pressure. The estimated right ventricular systolic pressure is  123XX123 mmHg.   3. Left atrial size was moderately dilated.   4. Right atrial size was mildly dilated.   5. The mitral valve is normal in structure. No evidence of mitral valve  regurgitation. No evidence of mitral stenosis.   6. The aortic valve is tricuspid. There is mild calcification of the  aortic valve. Aortic valve regurgitation is mild. No aortic stenosis is  present.   7. The inferior vena cava is normal in size with greater than 50%  respiratory variability, suggesting right atrial pressure of 3 mmHg.   8. The patient was in atrial fibrillation.   Patient Profile     89 y.o. 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 She was admitted with mechanical fall trying to kick a trash bag and suffered right periprosthetic proximal tibia fracture.  She was noted to be in AF RVR so surgery postponed and cardiology consulted.  Assessment & Plan    1. Mechanical fall with right periprosthetic proximal tibia fracture - s/p ORIF 06/28/22  2. Longstanding persistent atrial fibrillation with RVR this admission - home regimen includes digoxin and Toprol 150mg   daily which is presently written to continue - PM digoxin level 3/17 was 0.5 - TSH wnl earlier this month - suspected that pain and NPO status was driving RVR since patient is in atrial fibrillation at baseline, rapid rate reflects deviation from baseline - treated with IV amiodarone this admission, will discuss further management with Dr. Harrell Gave (need to use caution with amio + digoxin for longer period of time) - amio drip briefly paused then restarted as above - written to resume Eliquis this AM by Dr. Louanne Belton - will confirm dosing with MD - per Dr. Victorino December notes from 2023 Eliquis dose was adjusted for age + weight but patient now meets criteria for 5mg  BID dosing, though older notes suggest that possibly she was on lower dose due to prior retroperitoneal hematoma - seems reasonable to continue with 2.5mg  BID dosing to balance risk/benefit  3. Chronic HFrEF - echo 06/28/22 EF 25-30%, global HK, LBBB, mildly reduced RVSF, mildly elevated PASP, moderate LAE, mild RAE - torsemide 10mg  daily resumed - prior OP weight 05/20/22 152lb, here at 163lb, will review escalation with Dr. Harrell Gave though BPs may not support aggressive titration  4. LBBB - chronic, no bradycardia seen  For questions or updates, please contact [place] Please consult [email or web address] for contact info under Cardiology/STEMI.  Signed, Charlie Pitter, PA-C 06/29/2022, 8:25 AM

## 2022-06-29 NOTE — Progress Notes (Signed)
PROGRESS NOTE    [name]  [id number] DOB: 1931-03-16 DOA: 06/26/2022 PCP: [name], MD    Brief Narrative:  [name] is a 89 y.o. female with a history of atrial fibrillation, HFrEF, anxiety, depression, GERD presented to hospital after sustaining a fall at home.  Patient uses walker at baseline but did lose her balance and had a fall.  Was on Eliquis as outpatient.  In the ED patient had mild tachycardia.  Labs were relatively within normal range.  X-ray showed comminuted fracture of the proximal shaft of tibia with segmental fracture of the proximal shaft of the fibula on the right.  Orthopedics was consulted and patient was admitted hospital for further evaluation and treatment.   Assessment and Plan:   Right tibial/fibula fracture Status post mechanical fall.  X-rays on admission significant for comminuted fracture of the proximal shaft of the tibia and segmental fracture of proximal shaft of the fibula/fibular neck. Orthopedic surgery recommended open reduction and internal fixation and patient underwent surgical fixation on 06/28/2022.  Follow orthopedic recommendations.  Will need physical therapy and possible rehab.  Right foot pain History of neuropathy.  Foot x-ray significant for right proximal first phalangeal fracture.  Follow orthopedic recommendation.   Longstanding persistent atrial fibrillation with RVR Likely RVR secondary to missing beta-blocker at home.  Cardiology was consulted.  On amiodarone drip.  Continue metoprolol digoxin.  Eliquis was kept on hold for surgery.-2D echocardiogram showed reduced LV function at 25 to 30% with global hypokinesis.  Controlled at this time.  Follow cardiology recommendation.  Resumed Eliquis.  Chronic combined systolic and diastolic heart failure Repeated 2D echocardiogram from 06/28/2022 with LV ejection fraction of 25 to 30% with global hypokinesis.  Continue beta-blockers, torsemide.  Cardiology on board.   Continue intake and output charting.  Essential hypertension -Continue metoprolol   Hyponatremia Mild.  Latest sodium of 131.  Will continue to monitor.   PAD Follows with cardiology as outpatient.  Anxiety/Depression -Continue venlafaxine     DVT prophylaxis: apixaban (ELIQUIS) tablet 2.5 mg Start: 06/29/22 1000 SCDs Start: 06/28/22 1632 apixaban (ELIQUIS) tablet 2.5 mg   Code Status:     Code Status: Full Code  Disposition: Likely to skilled nursing facility.  PT evaluation pending.  Status is: Inpatient  Remains inpatient appropriate because: Status post ORIF, atrial fibrillation with RVR, need for rehabilitation.   Family Communication: Spoke with the patient's daughter and husband at bedside.  Consultants:  Orthopedics Cardiology  Procedures:   open reduction and internal fixation  on 06/28/2022.   Antimicrobials:  None  Anti-infectives (From admission, onward)    Start     Dose/Rate Route Frequency Ordered Stop   06/28/22 2000  ceFAZolin (ANCEF) IVPB 2g/100 mL premix        2 g 200 mL/hr over 30 Minutes Intravenous Every 8 hours 06/28/22 1632 06/29/22 1959   06/28/22 1304  vancomycin (VANCOCIN) powder  Status:  Discontinued          As needed 06/28/22 1304 06/28/22 1334        Subjective: Today, patient was seen and examined at bedside.  Patient denies overt pain.  Denies any chest pain shortness of breath dizziness lightheadedness but could not sleep well in the nighttime.  Objective: Vitals:   06/29/22 0500 06/29/22 0926 06/29/22 0930 06/29/22 0945  BP:   122/83 120/76  Pulse: 81  (!) 120 90  Resp: 18   18  Temp: (!) 97.5 F (36.4 C)  TempSrc: Oral     SpO2:  92%  93%  Weight: 74.1 kg     Height:        Intake/Output Summary (Last 24 hours) at 06/29/2022 1130 Last data filed at 06/29/2022 0900 Gross per 24 hour  Intake 600 ml  Output 40 ml  Net 560 ml   Filed Weights   06/26/22 1523 06/28/22 0500 06/29/22 0500  Weight: 68 kg 73 kg  74.1 kg    Physical Examination: Body mass index is 27.18 kg/m.  General:  Average built, not in obvious distress elderly female, alert awake and Communicative, HENT:   No scleral pallor or icterus noted. Oral mucosa is moist.  Chest:  Clear breath sounds.  Diminished breath sounds bilaterally. No crackles or wheezes.  CVS: S1 &S2 heard. No murmur.  Regular rate and rhythm. Abdomen: Soft, nontender, nondistended.  Bowel sounds are heard.   Extremities: Right lower extremity on wrap. Psych: Alert, awake and oriented, normal mood CNS:  No cranial nerve deficits.  Moves extremities, wiggles toes on the right lower extremity. Skin: Warm and dry.  Right lower extremity status post ORIF with dressing.  Data Reviewed:   CBC: Recent Labs  Lab 06/26/22 1505 06/27/22 0454 06/29/22 0251  WBC 9.8 9.1 9.3  NEUTROABS 7.1  --   --   HGB 12.5 11.7* 9.1*  HCT 38.6 35.9* 28.6*  MCV 95.3 93.2 95.3  PLT 255 248 [id number]    Basic Metabolic Panel: Recent Labs  Lab 06/26/22 1505 06/27/22 0454 06/28/22 0336 06/29/22 0251  NA 134* 135 134* 131*  K 3.9 4.5 3.9 4.0  CL 96* 100 97* 96*  CO2 24 28 27 25   GLUCOSE 104* 133* 113* 144*  BUN 21 21 24* 21  CREATININE 0.82 0.86 1.02* 0.75  CALCIUM 8.9 9.0 8.9 8.7*  MG 1.9  --  1.8  --     Liver Function Tests: No results for input(s): "AST", "ALT", "ALKPHOS", "BILITOT", "PROT", "ALBUMIN" in the last 168 hours.   Radiology Studies: ECHOCARDIOGRAM COMPLETE  Result Date: 06/28/2022    ECHOCARDIOGRAM REPORT   Patient Name:   [name] Date of Exam: 06/28/2022 Medical Rec #:  [id number]       Height:       65.0 in Accession #:    [id number]      Weight:       160.9 lb Date of Birth:  09/14/1930       BSA:          1.804 m Patient Age:    66 years        BP:           104/52 mmHg Patient Gender: F               HR:           61 bpm. Exam Location:  Inpatient Procedure: 2D Echo Indications:    atrial fibrillation  History:        Patient has prior  history of Echocardiogram examinations, most                 recent 06/24/2020. Arrythmias:LBBB; Risk Factors:Hypertension.  Sonographer:    Johny Chess RDCS Referring Phys: [place]  1. Left ventricular ejection fraction, by estimation, is 25 to 30%. The left ventricle has severely decreased function. The left ventricle demonstrates global hypokinesis with septal-lateral dyssynchrony consistent with LBBB. Left ventricular diastolic parameters are indeterminate.  2. Right ventricular  systolic function is mildly reduced. The right ventricular size is normal. There is mildly elevated pulmonary artery systolic pressure. The estimated right ventricular systolic pressure is 123XX123 mmHg.  3. Left atrial size was moderately dilated.  4. Right atrial size was mildly dilated.  5. The mitral valve is normal in structure. No evidence of mitral valve regurgitation. No evidence of mitral stenosis.  6. The aortic valve is tricuspid. There is mild calcification of the aortic valve. Aortic valve regurgitation is mild. No aortic stenosis is present.  7. The inferior vena cava is normal in size with greater than 50% respiratory variability, suggesting right atrial pressure of 3 mmHg.  8. The patient was in atrial fibrillation. FINDINGS  Left Ventricle: Left ventricular ejection fraction, by estimation, is 25 to 30%. The left ventricle has severely decreased function. The left ventricle demonstrates global hypokinesis. The left ventricular internal cavity size was normal in size. There is no left ventricular hypertrophy. Left ventricular diastolic parameters are indeterminate. Right Ventricle: The right ventricular size is normal. No increase in right ventricular wall thickness. Right ventricular systolic function is mildly reduced. There is mildly elevated pulmonary artery systolic pressure. The tricuspid regurgitant velocity  is 2.93 m/s, and with an assumed right atrial pressure of 3 mmHg, the estimated  right ventricular systolic pressure is 123XX123 mmHg. Left Atrium: Left atrial size was moderately dilated. Right Atrium: Right atrial size was mildly dilated. Pericardium: There is no evidence of pericardial effusion. Mitral Valve: The mitral valve is normal in structure. No evidence of mitral valve regurgitation. No evidence of mitral valve stenosis. Tricuspid Valve: The tricuspid valve is normal in structure. Tricuspid valve regurgitation is mild. Aortic Valve: The aortic valve is tricuspid. There is mild calcification of the aortic valve. Aortic valve regurgitation is mild. No aortic stenosis is present. Pulmonic Valve: The pulmonic valve was normal in structure. Pulmonic valve regurgitation is not visualized. Aorta: The aortic root is normal in size and structure. Venous: The inferior vena cava is normal in size with greater than 50% respiratory variability, suggesting right atrial pressure of 3 mmHg. IAS/Shunts: No atrial level shunt detected by color flow Doppler.  LEFT VENTRICLE PLAX 2D LVIDd:         5.00 cm LVIDs:         4.10 cm LV PW:         0.80 cm LV IVS:        1.00 cm LVOT diam:     1.80 cm LV SV:         39 LV SV Index:   22 LVOT Area:     2.54 cm  RIGHT VENTRICLE          IVC RV Basal diam:  2.80 cm  IVC diam: 1.40 cm TAPSE (M-mode): 1.2 cm LEFT ATRIUM             Index        RIGHT ATRIUM           Index LA diam:        4.80 cm 2.66 cm/m   RA Area:     19.20 cm LA Vol (A2C):   98.5 ml 54.61 ml/m  RA Volume:   51.00 ml  28.28 ml/m LA Vol (A4C):   80.6 ml 44.69 ml/m LA Biplane Vol: 88.9 ml 49.29 ml/m  AORTIC VALVE LVOT Vmax:   77.30 cm/s LVOT Vmean:  51.700 cm/s LVOT VTI:    0.154 m  AORTA Ao Root diam: 3.20 cm  Ao Asc diam:  3.40 cm TRICUSPID VALVE TR Peak grad:   34.3 mmHg TR Vmax:        293.00 cm/s  SHUNTS Systemic VTI:  0.15 m Systemic Diam: 1.80 cm Dalton McleanMD Electronically signed by Franki Monte Signature Date/Time: 06/28/2022/4:21:44 PM    Final    DG Tibia/Fibula Right  Port  Result Date: 06/28/2022 CLINICAL DATA:  Right tibial ORIF EXAM: PORTABLE RIGHT TIBIA AND FIBULA - 2 VIEW COMPARISON:  06/26/2022 FINDINGS: Interval ORIF of comminuted proximal tibial diaphyseal fracture with lateral sideplate and screw fixation construct. Improved fracture alignment. Fibular diaphyseal fracture alignment is also improved. Expected postoperative changes within the overlying soft tissues. IMPRESSION: Interval ORIF of comminuted tibial diaphyseal fracture with improved fracture alignment. Electronically Signed   By: Davina Poke D.O.   On: 06/28/2022 14:35   DG Tibia/Fibula Right  Result Date: 06/28/2022 CLINICAL DATA:  Fluoroscopic assistance for internal fixation EXAM: RIGHT TIBIA AND FIBULA - 2 VIEW COMPARISON:  06/26/2022 FINDINGS: Fluoroscopic images show internal fixation of comminuted fracture of shaft of right tibia with metallic plate and multiple screws. There is previous right knee arthroplasty. Fluoroscopy time 55 seconds. Radiation dose 1.68 mGy. IMPRESSION: Fluoroscopic assistance was provided for internal fixation of fracture of right tibia. Electronically Signed   By: Elmer Picker M.D.   On: 06/28/2022 13:46   DG C-Arm 1-60 Min-No Report  Result Date: 06/28/2022 Fluoroscopy was utilized by the requesting physician.  No radiographic interpretation.   DG Foot Complete Right  Result Date: 06/27/2022 CLINICAL DATA:  Right foot pain, initial encounter EXAM: RIGHT FOOT COMPLETE - 3+ VIEW COMPARISON:  None Available. FINDINGS: Postsurgical changes are noted in the distal tibia and fibula. Mildly angulated fracture of the first proximal phalanx is noted. No other fractures are seen. Splinting material is noted in place. IMPRESSION: First proximal phalangeal fracture. Electronically Signed   By: Inez Catalina M.D.   On: 06/27/2022 16:12      LOS: 3 days    Flora Lipps, MD [facility] Available via Epic secure chat 7am-7pm After these hours, please  refer to coverage provider listed on [email or web address] 06/29/2022, 11:30 AM

## 2022-06-29 NOTE — Evaluation (Signed)
Occupational Therapy Evaluation Patient Details Name: [name] MRN: [id number] DOB: 1930-08-06 Today's Date: 06/29/2022   History of Present Illness Pt is an 89 y/o female admitted after a fall with fx of R tibia. Pt underwent ORIF of RLE on 3/19. PMH: anemia, a fib with RVR, DJD, GERD, c diff, IBS, osteoporosis, sciatica, L hemi hip arthroplasty (2021).   Clinical Impression   PTA, pt lives with her spouse in an attached apartment of their daughter's home. Pt reports typically Modified Independent with ADLs and mobility using a walker. Pt presents now with no pain and deficits in standing balance (likely also d/t reported R LE numbness). Overall, pt able to progress from Mod A x 2 for transfers using RW to Min A x 2 for safety. Pt requires no more than Setup for UB ADL and Min A for LB ADLs with good flexibility noted. Educated on use of wheelchair for primary means of mobility d/t WBAT only for transfers; confirmed that w/c will fit in all needed doorways at home. Family entering at end of session, confirm ability to provide 24/7 assist at home and that a ramp is currently being built for pt. Recommend HHOT follow up at DC.     Recommendations for follow up therapy are one component of a multi-disciplinary discharge planning process, led by the attending physician.  Recommendations may be updated based on patient status, additional functional criteria and insurance authorization.   Follow Up Recommendations  Home health OT     Assistance Recommended at Discharge Frequent or constant Supervision/Assistance  Patient can return home with the following A little help with walking and/or transfers;A little help with bathing/dressing/bathroom;Assistance with cooking/housework;Help with stairs or ramp for entrance    Functional Status Assessment  Patient has had a recent decline in their functional status and demonstrates the ability to make significant improvements in function in a  reasonable and predictable amount of time.  Equipment Recommendations  None recommended by OT    Recommendations for Other Services       Precautions / Restrictions Precautions Precautions: Fall Restrictions Weight Bearing Restrictions: Yes RLE Weight Bearing: Weight bearing as tolerated Other Position/Activity Restrictions: WBAT transfers only      Mobility Bed Mobility Overal bed mobility: Needs Assistance Bed Mobility: Supine to Sit     Supine to sit: Min assist     General bed mobility comments: light assist for RLE to EOB    Transfers Overall transfer level: Needs assistance Equipment used: Rolling walker (2 wheels) Transfers: Sit to/from Stand, Bed to chair/wheelchair/BSC Sit to Stand: Min assist, Mod assist Stand pivot transfers: Mod assist, +2 safety/equipment         General transfer comment: Mod A x 2 for safety in initial stand from bedside - able to clear bottom but assist needed to gain balance to reach from bed to RW handles. Mod A x 2 for safety in pivoting to recliner with assist for RLE advancement (reported numbness and feeling foot stuck to floor), balance and RW navigation. trialed standing from RW with improved clearance and ability to gain balance with Min A      Balance Overall balance assessment: Needs assistance, History of Falls Sitting-balance support: No upper extremity supported, Feet supported Sitting balance-Leahy Scale: Fair     Standing balance support: Bilateral upper extremity supported, During functional activity Standing balance-Leahy Scale: Poor  ADL either performed or assessed with clinical judgement   ADL Overall ADL's : Needs assistance/impaired Eating/Feeding: Independent   Grooming: Set up;Sitting   Upper Body Bathing: Set up;Sitting   Lower Body Bathing: Minimal assistance;Sit to/from stand   Upper Body Dressing : Set up;Sitting   Lower Body Dressing: Minimal  assistance;Sit to/from stand Lower Body Dressing Details (indicate cue type and reason): able to don L sock easily bending down to feet, some assist with R sock due to swelling and bandages Toilet Transfer: Moderate assistance;Minimal assistance;+2 for safety/equipment;Stand-pivot;Rolling walker (2 wheels);BSC/3in1   Toileting- Clothing Manipulation and Hygiene: Minimal assistance;Sitting/lateral lean;Sit to/from stand         General ADL Comments: Minor limitations due to RLE numbness and balance deficits; emphasis on w/c use for ADLs and task modification     Vision Baseline Vision/History: 1 Wears glasses Ability to See in Adequate Light: 0 Adequate Patient Visual Report: No change from baseline Vision Assessment?: No apparent visual deficits     Perception     Praxis      Pertinent Vitals/Pain Pain Assessment Pain Assessment: No/denies pain     Hand Dominance Right   Extremity/Trunk Assessment Upper Extremity Assessment Upper Extremity Assessment: Overall WFL for tasks assessed   Lower Extremity Assessment Lower Extremity Assessment: Defer to PT evaluation   Cervical / Trunk Assessment Cervical / Trunk Assessment: Normal   Communication Communication Communication: No difficulties   Cognition Arousal/Alertness: Awake/alert Behavior During Therapy: WFL for tasks assessed/performed, Restless Overall Cognitive Status: Within Functional Limits for tasks assessed                                       General Comments  Daughter and pt husband entering during session    Exercises     Shoulder Instructions      Home Living Family/patient expects to be discharged to:: Private residence Living Arrangements: Spouse/significant other;Children Available Help at Discharge: Family;Available 24 hours/day Type of Home: House Home Access: Stairs to enter;Ramped entrance Entrance Stairs-Number of Steps: 2-3 though ramp being built at front door  currently   Home Layout: One level     Bathroom Shower/Tub: Occupational psychologist: Handicapped height Bathroom Accessibility: Yes How Accessible: Accessible via wheelchair Home Equipment: Conservation officer, nature (2 wheels);Rollator (4 wheels);Wheelchair - manual;Transport chair;BSC/3in1;Grab bars - toilet;Grab bars - tub/shower;Hand held shower head;Shower seat          Prior Functioning/Environment Prior Level of Function : Independent/Modified Independent             Mobility Comments: use of walker for mobility ADLs Comments: MOD I with ADLs, assist for IADLs from family        OT Problem List: Decreased strength;Decreased activity tolerance;Impaired balance (sitting and/or standing);Decreased knowledge of precautions;Decreased knowledge of use of DME or AE;Impaired sensation      OT Treatment/Interventions: Self-care/ADL training;Therapeutic exercise;DME and/or AE instruction;Energy conservation;Therapeutic activities;Patient/family education;Balance training    OT Goals(Current goals can be found in the care plan section) Acute Rehab OT Goals Patient Stated Goal: home soon OT Goal Formulation: With patient/family Time For Goal Achievement: 07/13/22 Potential to Achieve Goals: Good  OT Frequency: Min 2X/week    Co-evaluation PT/OT/SLP Co-Evaluation/Treatment: Yes Reason for Co-Treatment: For patient/therapist safety;To address functional/ADL transfers   OT goals addressed during session: ADL's and self-care;Strengthening/ROM      AM-PAC OT "6 Clicks" Daily Activity  Outcome Measure Help from another person eating meals?: None Help from another person taking care of personal grooming?: A Little Help from another person toileting, which includes using toliet, bedpan, or urinal?: A Little Help from another person bathing (including washing, rinsing, drying)?: A Little Help from another person to put on and taking off regular upper body clothing?: A  Little Help from another person to put on and taking off regular lower body clothing?: A Little 6 Click Score: 19   End of Session Equipment Utilized During Treatment: Rolling walker (2 wheels);Gait belt Nurse Communication: Mobility status (RN present)  Activity Tolerance: Patient tolerated treatment well Patient left: in chair;with call bell/phone within reach;with chair alarm set;with nursing/sitter in room;with family/visitor present  OT Visit Diagnosis: Unsteadiness on feet (R26.81);Other abnormalities of gait and mobility (R26.89);Muscle weakness (generalized) (M62.81)                Time: [id number] OT Time Calculation (min): 31 min Charges:  OT General Charges $OT Visit: 1 Visit OT Evaluation $OT Eval Moderate Complexity: 1 Mod  Malachy Chamber, OTR/L Acute Rehab Services Office: [phone]   Layla Maw 06/29/2022, 11:09 AM

## 2022-06-29 NOTE — Progress Notes (Signed)
Patient s/p ORIF of Tibia. Pharmacy consulted by Ortho to restart home DOAC if POD1 Hgb is >9 and stable. Hgb this AM 9.1. Will restart apixaban 2.5mg  PO BID  Vendela Troung A. Levada Dy, PharmD, BCPS, FNKF Clinical Pharmacist [facility] Please utilize Amion for appropriate phone number to reach the unit pharmacist ([place])

## 2022-06-29 NOTE — Plan of Care (Signed)

## 2022-06-29 NOTE — Progress Notes (Signed)
Orthopedic Tech Progress Note Patient Details:  [name] 03/24/1931 [id number]  Patient ID: [name], female   DOB: [birth year], 89 y.o.   MRN: [id number] Patient doesn't meet criteria for ohf. Must be under 70 to get ohf. Karolee Stamps 06/29/2022, 2:06 AM

## 2022-06-29 NOTE — Care Management Important Message (Signed)
Important Message  Patient Details  Name: [name] MRN: [id number] Date of Birth: 02-Jul-1930   Medicare Important Message Given:  Yes     Orbie Pyo 06/29/2022, 2:38 PM

## 2022-06-29 NOTE — Evaluation (Signed)
Physical Therapy Evaluation Patient Details Name: [name] MRN: [id number] DOB: [birth year] Today's Date: 06/29/2022  History of Present Illness  Pt is an 89 y/o female admitted 3/17 after a fall with fx of R tibia. Pt underwent ORIF of RLE on 3/19. PMH: anemia, a fib with RVR, DJD, GERD, c diff, IBS, osteoporosis, sciatica, L hemi hip arthroplasty (2021).  Clinical Impression  Pt presents today with impaired functional mobility after a fall at home, now s/p ORIF of tibia and WBAT to RLE for transfers only. At baseline, pt is modified independent with all mobility with use of RW, pt with full time care at home from her husband and daughter. Pt required modAx2 for first stand and transfer to chair, progressing to minA for second stand. Pt without pain throughout session but swelling and diminished light touch sensation to RLE, anticipate progression during stay. Pt's daughter and husband present at end of session and agreeable to caring for pt at discharge. Acute PT will continue to follow up with pt as appropriate to progress mobility, recommend HHPT at discharge.     Recommendations for follow up therapy are one component of a multi-disciplinary discharge planning process, led by the attending physician.  Recommendations may be updated based on patient status, additional functional criteria and insurance authorization.  Follow Up Recommendations Home health PT      Assistance Recommended at Discharge Frequent or constant Supervision/Assistance  Patient can return home with the following  A little help with walking and/or transfers;Help with stairs or ramp for entrance;Assist for transportation    Equipment Recommendations None recommended by PT  Recommendations for Other Services       Functional Status Assessment Patient has had a recent decline in their functional status and demonstrates the ability to make significant improvements in function in a reasonable and predictable amount  of time.     Precautions / Restrictions Precautions Precautions: Fall Restrictions Weight Bearing Restrictions: Yes RLE Weight Bearing: Weight bearing as tolerated Other Position/Activity Restrictions: WBAT transfers only      Mobility  Bed Mobility Overal bed mobility: Needs Assistance Bed Mobility: Supine to Sit     Supine to sit: Min assist, HOB elevated     General bed mobility comments: minA for RLE management, use of bed rail    Transfers Overall transfer level: Needs assistance Equipment used: Rolling walker (2 wheels) Transfers: Sit to/from Stand, Bed to chair/wheelchair/BSC Sit to Stand: Min assist, Mod assist Stand pivot transfers: Mod assist, +2 safety/equipment         General transfer comment: modAx2 for first stand from bed and pivot to chair with 1 person assisting for RLE management, standing from chair with minA    Ambulation/Gait               General Gait Details: transfers only per WB orders  Stairs            Wheelchair Mobility    Modified Rankin (Stroke Patients Only)       Balance Overall balance assessment: Needs assistance, History of Falls Sitting-balance support: No upper extremity supported, Feet supported Sitting balance-Leahy Scale: Fair     Standing balance support: Bilateral upper extremity supported, During functional activity Standing balance-Leahy Scale: Poor Standing balance comment: requiring assist for first trial of static standing and pivoting to chair, improving with second standing trial  Pertinent Vitals/Pain Pain Assessment Pain Assessment: No/denies pain    Home Living Family/patient expects to be discharged to:: Private residence Living Arrangements: Spouse/significant other;Children Available Help at Discharge: Family;Available 24 hours/day Type of Home: House Home Access: Stairs to enter;Ramped entrance   Entrance Stairs-Number of Steps: 2 though  ramp being built at front door currently   Home Layout: One level Home Equipment: Conservation officer, nature (2 wheels);Rollator (4 wheels);Wheelchair - manual;Transport chair;BSC/3in1;Grab bars - toilet;Grab bars - tub/shower;Hand held shower head;Shower seat Additional Comments: lives with husband in apartment attached to daughter's home. Has STE but ramp being built today    Prior Function Prior Level of Function : Independent/Modified Independent             Mobility Comments: uses RW for mobility ADLs Comments: MOD I with ADLs, assist for IADLs from family     Hand Dominance   Dominant Hand: Right    Extremity/Trunk Assessment   Upper Extremity Assessment Upper Extremity Assessment: Defer to OT evaluation    Lower Extremity Assessment Lower Extremity Assessment: Generalized weakness (no formal MMT to RLE but noted swelling and diminished light touch sensation to the toes, active movement but grossly 3-/5 throughout)    Cervical / Trunk Assessment Cervical / Trunk Assessment: Normal  Communication   Communication: No difficulties  Cognition Arousal/Alertness: Awake/alert Behavior During Therapy: WFL for tasks assessed/performed, Restless Overall Cognitive Status: Within Functional Limits for tasks assessed                                 General Comments: pleasant throughout session and daughter and husband arriving at end of session        General Comments General comments (skin integrity, edema, etc.): VSS on room air, daughter and husband arriving during session    Exercises     Assessment/Plan    PT Assessment Patient needs continued PT services  PT Problem List Decreased strength;Decreased activity tolerance;Decreased balance;Decreased mobility       PT Treatment Interventions DME instruction;Functional mobility training;Therapeutic activities;Therapeutic exercise;Balance training;Neuromuscular re-education;Patient/family education;Wheelchair  mobility training    PT Goals (Current goals can be found in the Care Plan section)  Acute Rehab PT Goals Patient Stated Goal: go home PT Goal Formulation: With patient/family Time For Goal Achievement: 07/13/22 Potential to Achieve Goals: Good Additional Goals Additional Goal #1: Pt will propel wheelchair x100 feet with BUE and supervision for safety.    Frequency Min 3X/week     Co-evaluation PT/OT/SLP Co-Evaluation/Treatment: Yes Reason for Co-Treatment: For patient/therapist safety;To address functional/ADL transfers PT goals addressed during session: Mobility/safety with mobility;Balance OT goals addressed during session: ADL's and self-care;Strengthening/ROM       AM-PAC PT "6 Clicks" Mobility  Outcome Measure Help needed turning from your back to your side while in a flat bed without using bedrails?: A Little Help needed moving from lying on your back to sitting on the side of a flat bed without using bedrails?: A Little Help needed moving to and from a bed to a chair (including a wheelchair)?: A Lot Help needed standing up from a chair using your arms (e.g., wheelchair or bedside chair)?: A Lot Help needed to walk in hospital room?: Total Help needed climbing 3-5 steps with a railing? : Total 6 Click Score: 12    End of Session Equipment Utilized During Treatment: Gait belt Activity Tolerance: Patient tolerated treatment well Patient left: in chair;with call bell/phone within reach;with chair alarm  set;with family/visitor present;with nursing/sitter in room Nurse Communication: Mobility status PT Visit Diagnosis: Other abnormalities of gait and mobility (R26.89);Muscle weakness (generalized) (M62.81);History of falling (Z91.81)    Time: [id number] PT Time Calculation (min) (ACUTE ONLY): 31 min   Charges:   PT Evaluation $PT Eval Moderate Complexity: 1 Mod          Charlynne Cousins, PT DPT Acute Rehabilitation Services Office [phone]   Luvenia Heller 06/29/2022, 12:59 PM

## 2022-06-29 NOTE — Progress Notes (Signed)
Orthopaedic Trauma Progress Note  SUBJECTIVE: Doing well this AM. Pain controlled. Has not been out of bed yet since surgery. No chest pain. No SOB. No nausea/vomiting. No other complaints. Sodium level slightly low this AM. Patient has been to [facility] following previous orthopedic surgery, had great experience there but would prefer to discharge home following this hospitalization. Patient's husband and daughter are at home to assist her at discharge.   OBJECTIVE:  Vitals:   06/28/22 2128 06/29/22 0500  BP: 120/80   Pulse: 82 81  Resp: 18 18  Temp: (!) 97.4 F (36.3 C) (!) 97.5 F (36.4 C)  SpO2:      General: Sitting up in bed, NAD. Just finished her breakfast Respiratory: No increased work of breathing.  RLE: Dressing CDI. No significant tenderness throughout the lower leg. No calf tenderness. Tolerates gentle knee ROM. Able to wiggle toes a small amount. Active ankle DF/PF intact but limited. Endorses sensation over the toes.   IMAGING: Stable post op imaging.   LABS:  Results for orders placed or performed during the hospital encounter of 06/26/22 (from the past 24 hour(s))  CBC     Status: Abnormal   Collection Time: 06/29/22  2:51 AM  Result Value Ref Range   WBC 9.3 4.0 - 10.5 K/uL   RBC 3.00 (L) 3.87 - 5.11 MIL/uL   Hemoglobin 9.1 (L) 12.0 - 15.0 g/dL   HCT 28.6 (L) 36.0 - 46.0 %   MCV 95.3 80.0 - 100.0 fL   MCH 30.3 26.0 - 34.0 pg   MCHC 31.8 30.0 - 36.0 g/dL   RDW 12.4 11.5 - 15.5 %   Platelets 180 150 - 400 K/uL   nRBC 0.0 0.0 - 0.2 %  Basic metabolic panel     Status: Abnormal   Collection Time: 06/29/22  2:51 AM  Result Value Ref Range   Sodium 131 (L) 135 - 145 mmol/L   Potassium 4.0 3.5 - 5.1 mmol/L   Chloride 96 (L) 98 - 111 mmol/L   CO2 25 22 - 32 mmol/L   Glucose, Bld 144 (H) 70 - 99 mg/dL   BUN 21 8 - 23 mg/dL   Creatinine, Ser 0.75 0.44 - 1.00 mg/dL   Calcium 8.7 (L) 8.9 - 10.3 mg/dL   GFR, Estimated >60 >60 mL/min   Anion gap 10 5 - 15     ASSESSMENT: [name] is a 89 y.o. female, 1 Day Post-Op s/p OPEN REDUCTION INTERNAL FIXATION RIGHT TIBIA  CV/Blood loss: Acute blood loss anemia, Hgb 9.1 this AM. Hemodynamically stable. Continue to monitor CBC  PLAN: Weightbearing: WBAT RLE for transfers onoly. No walker ambulation on RLE ROM:  ok for unrestricted ROM  Incisional and dressing care: Reinforce dressings as needed  Showering: Hold off on showering for now. [place] for bed bath Orthopedic device(s): None  Pain management:  1. Tylenol 325-650 mg q 6 hours PRN 2. Norco 5-325 mg (1-2 tabs) q 6 hours PRN 3. Morphine 0.5-1 mg q 2 hours PRN VTE prophylaxis:  Restart Eliquis today , SCDs ID:  Ancef 2gm post op Foley/Lines:  No foley, KVO IVFs Impediments to Fracture Healing: Vit d level pending, will start supplementation as indicated Dispo: PT/OT eval today. Patient would prefer to go home with [facility] if able but amenable to SNF if needed.  Follow up on Vit D level. Okay for discharge from ortho standpoint once cleared by medicine team and therapies  D/C recommendations: - Norco vs Tylenol  for pain control - Homde dose Eliquis for DVT prophylaxis - Possible need for Vit D supplementation  Follow - up plan: 2 weeks after d/c for wound check, repeat x-rays, suture removal   Contact information:  Katha Hamming MD, Catherine Nyhan PA-C. After hours and holidays please check [email or web address] for group call information for [facility]   Gwinda Passe, PA-C [phone] (office) [email or web address]

## 2022-06-30 DIAGNOSIS — W19XXXA Unspecified fall, initial encounter: Secondary | ICD-10-CM | POA: Diagnosis not present

## 2022-06-30 DIAGNOSIS — I4821 Permanent atrial fibrillation: Secondary | ICD-10-CM | POA: Diagnosis not present

## 2022-06-30 DIAGNOSIS — S82201A Unspecified fracture of shaft of right tibia, initial encounter for closed fracture: Secondary | ICD-10-CM | POA: Diagnosis not present

## 2022-06-30 DIAGNOSIS — I5042 Chronic combined systolic (congestive) and diastolic (congestive) heart failure: Secondary | ICD-10-CM | POA: Diagnosis not present

## 2022-06-30 LAB — BASIC METABOLIC PANEL
Anion gap: 9 (ref 5–15)
BUN: 23 mg/dL (ref 8–23)
CO2: 28 mmol/L (ref 22–32)
Calcium: 8.8 mg/dL — ABNORMAL LOW (ref 8.9–10.3)
Chloride: 94 mmol/L — ABNORMAL LOW (ref 98–111)
Creatinine, Ser: 1.03 mg/dL — ABNORMAL HIGH (ref 0.44–1.00)
GFR, Estimated: 52 mL/min — ABNORMAL LOW (ref >60)
Glucose, Bld: 95 mg/dL (ref 70–99)
Potassium: 3.5 mmol/L (ref 3.5–5.1)
Sodium: 131 mmol/L — ABNORMAL LOW (ref 135–145)

## 2022-06-30 LAB — CBC
HCT: 27.2 % — ABNORMAL LOW (ref 36.0–46.0)
Hemoglobin: 8.9 g/dL — ABNORMAL LOW (ref 12.0–15.0)
MCH: 30.6 pg (ref 26.0–34.0)
MCHC: 32.7 g/dL (ref 30.0–36.0)
MCV: 93.5 fL (ref 80.0–100.0)
Platelets: 223 10*3/uL (ref 150–400)
RBC: 2.91 MIL/uL — ABNORMAL LOW (ref 3.87–5.11)
RDW: 12.7 % (ref 11.5–15.5)
WBC: 9.9 10*3/uL (ref 4.0–10.5)
nRBC: 0 % (ref 0.0–0.2)

## 2022-06-30 NOTE — Progress Notes (Addendum)
Orthopaedic Trauma Progress Note  SUBJECTIVE: Doing well this AM. Pain controlled.  Was able to transfer to bedside chair yesterday, felt this went well.  No other specific issues of note this morning.  OBJECTIVE:  Vitals:   06/30/22 0115 06/30/22 0210  BP: (!) 86/49 (!) 90/59  Pulse: 78   Resp: 18   Temp: 97.9 F (36.6 C)   SpO2: 99%     General: Sitting up in bed, NAD.  Eating breakfast  Respiratory: No increased work of breathing.  RLE: Dressing CDI. No significant tenderness throughout the lower leg. No calf tenderness. Tolerates gentle knee ROM. Able to wiggle toes a small amount.  Tolerates gentle passive ankle range of motion but does endorse some pulling and muscle tightness to the anterior tibia with this.  Endorses sensation over the toes. + DP pulse  IMAGING: Stable post op imaging.   LABS:  Results for orders placed or performed during the hospital encounter of 06/26/22 (from the past 24 hour(s))  CBC     Status: Abnormal   Collection Time: 06/30/22  4:35 AM  Result Value Ref Range   WBC 9.9 4.0 - 10.5 K/uL   RBC 2.91 (L) 3.87 - 5.11 MIL/uL   Hemoglobin 8.9 (L) 12.0 - 15.0 g/dL   HCT 27.2 (L) 36.0 - 46.0 %   MCV 93.5 80.0 - 100.0 fL   MCH 30.6 26.0 - 34.0 pg   MCHC 32.7 30.0 - 36.0 g/dL   RDW 12.7 11.5 - 15.5 %   Platelets 223 150 - 400 K/uL   nRBC 0.0 0.0 - 0.2 %  Basic metabolic panel     Status: Abnormal   Collection Time: 06/30/22  4:35 AM  Result Value Ref Range   Sodium 131 (L) 135 - 145 mmol/L   Potassium 3.5 3.5 - 5.1 mmol/L   Chloride 94 (L) 98 - 111 mmol/L   CO2 28 22 - 32 mmol/L   Glucose, Bld 95 70 - 99 mg/dL   BUN 23 8 - 23 mg/dL   Creatinine, Ser 1.03 (H) 0.44 - 1.00 mg/dL   Calcium 8.8 (L) 8.9 - 10.3 mg/dL   GFR, Estimated 52 (L) >60 mL/min   Anion gap 9 5 - 15    ASSESSMENT: [name] is a 89 y.o. female, 2 Days Post-Op s/p OPEN REDUCTION INTERNAL FIXATION RIGHT TIBIA  CV/Blood loss: Acute blood loss anemia, Hgb 8.9 this AM.  Continue to monitor CBC  PLAN: Weightbearing: WBAT RLE for transfers only. No walker ambulation on RLE ROM:  ok for unrestricted ROM  Incisional and dressing care: Reinforce dressings as needed  Showering: Hold off on showering for now. [place] for bed bath Orthopedic device(s): None  Pain management:  1. Tylenol 325-650 mg q 6 hours PRN 2. Norco 5-325 mg (1-2 tabs) q 6 hours PRN 3. Morphine 0.5-1 mg q 2 hours PRN VTE prophylaxis:  Restart Eliquis today , SCDs ID:  Ancef 2gm post op completed Foley/Lines:  No foley, KVO IVFs Impediments to Fracture Healing: Vit d level 75, no additional supplementation needed  Dispo: PT/OT recommending home health therapies.  Plan to remove dressing RLE tomorrow 07/01/2022.  Okay for discharge from ortho standpoint once cleared by medicine team and therapies  D/C recommendations: -Resume home dose tramadol 3 times daily as needed for pain control - Home dose Eliquis for DVT prophylaxis - No additional Vit D supplementation  Follow - up plan: 2 weeks after d/c for wound check, repeat x-rays, suture  removal   Contact information:  Katha Hamming MD, Rushie Nyhan PA-C. After hours and holidays please check [email or web address] for group call information for [facility]   Gwinda Passe, PA-C [phone] (office) [email or web address]

## 2022-06-30 NOTE — Progress Notes (Signed)
PROGRESS NOTE    [name]  [id number] DOB: 03/15/1931 DOA: 06/26/2022 PCP: Catherine Ghent, MD    Brief Narrative:  [name] is a 89 y.o. female with a history of atrial fibrillation, HFrEF, anxiety, depression, GERD presented to hospital after sustaining a fall at home.  Patient uses walker at baseline but did lose her balance and had a fall.  Was on Eliquis as outpatient.  In the ED patient had mild tachycardia.  Labs were relatively within normal range.  X-ray showed comminuted fracture of the proximal shaft of tibia with segmental fracture of the proximal shaft of the fibula on the right.  Orthopedics was consulted and patient was admitted hospital for further evaluation and treatment.   Assessment and Plan:   Right tibial/fibula fracture Status post mechanical fall.  X-rays on admission significant for comminuted fracture of the proximal shaft of the tibia and segmental fracture of proximal shaft of the fibula/fibular neck. Orthopedic surgery recommended open reduction and internal fixation and patient underwent surgical fixation on 06/28/2022.  On 4 surgical dressing changes tomorrow.  Likely disposition home tomorrow with home health.    Right foot pain History of neuropathy.  Foot x-ray significant for right proximal first phalangeal fracture.  Orthopedics on board.   Longstanding persistent atrial fibrillation with RVR Likely RVR secondary to missing beta-blocker at home.  Cardiology followed the patient during hospitalization and was initially on amiodarone drip.  Cardiology has seen the patient today and is stable at this time.  Plan is to continue  digoxin Eliquis and metoprolol  succinate 150 mg daily. 2D echocardiogram showed reduced LV function at 25 to 30% with global hypokinesis.  Rate is controlled at this time.  Patient does have an an appointment to follow-up with cardiology on 07/15/2022.  Chronic combined systolic and diastolic heart failure Repeated 2D  echocardiogram from 06/28/2022 with LV ejection fraction of 25 to 30% with global hypokinesis.  Continue , torsemide, digoxin, metoprolol.  Cardiology on board.  Continue intake and output charting.  Essential hypertension -Continue metoprolol   Hyponatremia Mild.  Latest sodium of 131.  Will continue to monitor.   PAD Follows with cardiology as outpatient.  Anxiety/Depression -Continue venlafaxine     DVT prophylaxis: apixaban (ELIQUIS) tablet 2.5 mg Start: 06/29/22 1000 SCDs Start: 06/28/22 1632 apixaban (ELIQUIS) tablet 2.5 mg   Code Status:     Code Status: Full Code  Disposition: Home with home health on 07/01/2022.  Status is: Inpatient  Remains inpatient appropriate because: Status post ORIF, atrial fibrillation with RVR,    Family Communication: Spoke with the patient's daughter on the phone and updated her about the clinical condition of the patient.  Consultants:  Orthopedics Cardiology  Procedures:   open reduction and internal fixation  on 06/28/2022.   Antimicrobials:  None  Anti-infectives (From admission, onward)    Start     Dose/Rate Route Frequency Ordered Stop   06/28/22 2000  ceFAZolin (ANCEF) IVPB 2g/100 mL premix        2 g 200 mL/hr over 30 Minutes Intravenous Every 8 hours 06/28/22 1632 06/29/22 1526   06/28/22 1304  vancomycin (VANCOCIN) powder  Status:  Discontinued          As needed 06/28/22 1304 06/28/22 1334       Subjective: Today, patient was seen and examined at bedside.  Continues to feel better.  Denies any nausea, vomiting, fever, chills or rigor.  Has mild foot discomfort.   Objective: Vitals:  06/30/22 0930 06/30/22 0940 06/30/22 0950 06/30/22 1000  BP:   105/67 107/67  Pulse:    88  Resp: (!) 21 19 (!) 21 20  Temp:      TempSrc:      SpO2: 97% 90%  95%  Weight:      Height:        Intake/Output Summary (Last 24 hours) at 06/30/2022 1342 Last data filed at 06/30/2022 0300 Gross per 24 hour  Intake 1112.7 ml   Output 700 ml  Net 412.7 ml    Filed Weights   06/28/22 0500 06/29/22 0500 06/30/22 0300  Weight: 73 kg 74.1 kg 76.5 kg    Physical Examination: Body mass index is 28.07 kg/m.   General:  Average built, not in obvious distress, elderly female, alert awake and Communicative. HENT:   No scleral pallor or icterus noted. Oral mucosa is moist.  Chest:  Clear breath sounds.  Diminished breath sounds bilaterally. No crackles or wheezes.  CVS: S1 &S2 heard. No murmur.  Regular rate and rhythm. Abdomen: Soft, nontender, nondistended.  Bowel sounds are heard.   Extremities: Thinly with dressing. Psych: Alert, awake and oriented, normal mood CNS:  No cranial nerve deficits.  Moves extremities,   skin: Warm and dry.  Right lower extremity status post ORIF with dressing.  Data Reviewed:   CBC: Recent Labs  Lab 06/26/22 1505 06/27/22 0454 06/29/22 0251 06/30/22 0435  WBC 9.8 9.1 9.3 9.9  NEUTROABS 7.1  --   --   --   HGB 12.5 11.7* 9.1* 8.9*  HCT 38.6 35.9* 28.6* 27.2*  MCV 95.3 93.2 95.3 93.5  PLT 255 248 180 223     Basic Metabolic Panel: Recent Labs  Lab 06/26/22 1505 06/27/22 0454 06/28/22 0336 06/29/22 0251 06/30/22 0435  NA 134* 135 134* 131* 131*  K 3.9 4.5 3.9 4.0 3.5  CL 96* 100 97* 96* 94*  CO2 24 28 27 25 28   GLUCOSE 104* 133* 113* 144* 95  BUN 21 21 24* 21 23  CREATININE 0.82 0.86 1.02* 0.75 1.03*  CALCIUM 8.9 9.0 8.9 8.7* 8.8*  MG 1.9  --  1.8  --   --      Liver Function Tests: No results for input(s): "AST", "ALT", "ALKPHOS", "BILITOT", "PROT", "ALBUMIN" in the last 168 hours.   Radiology Studies: ECHOCARDIOGRAM COMPLETE  Result Date: 06/28/2022    ECHOCARDIOGRAM REPORT   Patient Name:   [name] Date of Exam: 06/28/2022 Medical Rec #:  [id number]       Height:       65.0 in Accession #:    [id number]      Weight:       160.9 lb Date of Birth:  1931/03/22       BSA:          1.804 m Patient Age:    6 years        BP:           104/52 mmHg  Patient Gender: F               HR:           61 bpm. Exam Location:  Inpatient Procedure: 2D Echo Indications:    atrial fibrillation  History:        Patient has prior history of Echocardiogram examinations, most                 recent 06/24/2020. Arrythmias:LBBB; Risk Factors:Hypertension.  Sonographer:  Johny Chess RDCS Referring Phys: [phone] RALPH A [place]  1. Left ventricular ejection fraction, by estimation, is 25 to 30%. The left ventricle has severely decreased function. The left ventricle demonstrates global hypokinesis with septal-lateral dyssynchrony consistent with LBBB. Left ventricular diastolic parameters are indeterminate.  2. Right ventricular systolic function is mildly reduced. The right ventricular size is normal. There is mildly elevated pulmonary artery systolic pressure. The estimated right ventricular systolic pressure is 123XX123 mmHg.  3. Left atrial size was moderately dilated.  4. Right atrial size was mildly dilated.  5. The mitral valve is normal in structure. No evidence of mitral valve regurgitation. No evidence of mitral stenosis.  6. The aortic valve is tricuspid. There is mild calcification of the aortic valve. Aortic valve regurgitation is mild. No aortic stenosis is present.  7. The inferior vena cava is normal in size with greater than 50% respiratory variability, suggesting right atrial pressure of 3 mmHg.  8. The patient was in atrial fibrillation. FINDINGS  Left Ventricle: Left ventricular ejection fraction, by estimation, is 25 to 30%. The left ventricle has severely decreased function. The left ventricle demonstrates global hypokinesis. The left ventricular internal cavity size was normal in size. There is no left ventricular hypertrophy. Left ventricular diastolic parameters are indeterminate. Right Ventricle: The right ventricular size is normal. No increase in right ventricular wall thickness. Right ventricular systolic function is mildly reduced. There is  mildly elevated pulmonary artery systolic pressure. The tricuspid regurgitant velocity  is 2.93 m/s, and with an assumed right atrial pressure of 3 mmHg, the estimated right ventricular systolic pressure is 123XX123 mmHg. Left Atrium: Left atrial size was moderately dilated. Right Atrium: Right atrial size was mildly dilated. Pericardium: There is no evidence of pericardial effusion. Mitral Valve: The mitral valve is normal in structure. No evidence of mitral valve regurgitation. No evidence of mitral valve stenosis. Tricuspid Valve: The tricuspid valve is normal in structure. Tricuspid valve regurgitation is mild. Aortic Valve: The aortic valve is tricuspid. There is mild calcification of the aortic valve. Aortic valve regurgitation is mild. No aortic stenosis is present. Pulmonic Valve: The pulmonic valve was normal in structure. Pulmonic valve regurgitation is not visualized. Aorta: The aortic root is normal in size and structure. Venous: The inferior vena cava is normal in size with greater than 50% respiratory variability, suggesting right atrial pressure of 3 mmHg. IAS/Shunts: No atrial level shunt detected by color flow Doppler.  LEFT VENTRICLE PLAX 2D LVIDd:         5.00 cm LVIDs:         4.10 cm LV PW:         0.80 cm LV IVS:        1.00 cm LVOT diam:     1.80 cm LV SV:         39 LV SV Index:   22 LVOT Area:     2.54 cm  RIGHT VENTRICLE          IVC RV Basal diam:  2.80 cm  IVC diam: 1.40 cm TAPSE (M-mode): 1.2 cm LEFT ATRIUM             Index        RIGHT ATRIUM           Index LA diam:        4.80 cm 2.66 cm/m   RA Area:     19.20 cm LA Vol (A2C):   98.5 ml 54.61 ml/m  RA Volume:  51.00 ml  28.28 ml/m LA Vol (A4C):   80.6 ml 44.69 ml/m LA Biplane Vol: 88.9 ml 49.29 ml/m  AORTIC VALVE LVOT Vmax:   77.30 cm/s LVOT Vmean:  51.700 cm/s LVOT VTI:    0.154 m  AORTA Ao Root diam: 3.20 cm Ao Asc diam:  3.40 cm TRICUSPID VALVE TR Peak grad:   34.3 mmHg TR Vmax:        293.00 cm/s  SHUNTS Systemic VTI:  0.15 m  Systemic Diam: 1.80 cm Dalton McleanMD Electronically signed by Franki Monte Signature Date/Time: 06/28/2022/4:21:44 PM    Final    DG Tibia/Fibula Right Port  Result Date: 06/28/2022 CLINICAL DATA:  Right tibial ORIF EXAM: PORTABLE RIGHT TIBIA AND FIBULA - 2 VIEW COMPARISON:  06/26/2022 FINDINGS: Interval ORIF of comminuted proximal tibial diaphyseal fracture with lateral sideplate and screw fixation construct. Improved fracture alignment. Fibular diaphyseal fracture alignment is also improved. Expected postoperative changes within the overlying soft tissues. IMPRESSION: Interval ORIF of comminuted tibial diaphyseal fracture with improved fracture alignment. Electronically Signed   By: Davina Poke D.O.   On: 06/28/2022 14:35      LOS: 4 days    Flora Lipps, MD [facility] Available via Epic secure chat 7am-7pm After these hours, please refer to coverage provider listed on [email or web address] 06/30/2022, 1:42 PM

## 2022-06-30 NOTE — Progress Notes (Addendum)
Progress Note  Patient Name: [name] Date of Encounter: 06/30/2022  Primary Cardiologist: Sanda Klein, MD  Subjective   Patient denies any cardiac complaints. No CP or SOB. RLE appears swollen under bandages. She states she was told by IM she would go home today and ortho that it would be tomorrow, so she's unsure of plan. Weight continues to uptrend though feeling well.  BP remains soft but no symptoms with this. Not pulling into Epic but last value 96/67 at bedside. HR 85.  Inpatient Medications    Scheduled Meds:  apixaban  2.5 mg Oral BID   digoxin  0.0625 mg Oral Daily   docusate sodium  100 mg Oral BID   metoprolol succinate  150 mg Oral Daily   pantoprazole  20 mg Oral Daily   polyethylene glycol  17 g Oral Daily   senna  1 tablet Oral BID   torsemide  10 mg Oral Daily   venlafaxine XR  75 mg Oral q morning   Continuous Infusions:  PRN Meds: acetaminophen, HYDROcodone-acetaminophen, metoCLOPramide **OR** metoCLOPramide (REGLAN) injection, metoprolol tartrate, morphine injection, ondansetron **OR** ondansetron (ZOFRAN) IV   Vital Signs    Vitals:   06/29/22 2010 06/30/22 0115 06/30/22 0210 06/30/22 0300  BP: 114/71 (!) 86/49 (!) 90/59   Pulse: 79 78    Resp: 16 18    Temp: 98.4 F (36.9 C) 97.9 F (36.6 C)    TempSrc: Oral Oral    SpO2: 100% 99%    Weight:    76.5 kg  Height:        Intake/Output Summary (Last 24 hours) at 06/30/2022 0919 Last data filed at 06/30/2022 0300 Gross per 24 hour  Intake 1112.7 ml  Output 700 ml  Net 412.7 ml      06/30/2022    3:00 AM 06/29/2022    5:00 AM 06/28/2022    5:00 AM  Last 3 Weights  Weight (lbs) 168 lb 10.4 oz 163 lb 5.8 oz 160 lb 15 oz  Weight (kg) 76.5 kg 74.1 kg 73 kg     Telemetry    Atrial fib rates 80s-90s - Personally Reviewed  ECG    No new tracings - Personally Reviewed  Physical Exam   GEN: No acute distress.  HEENT: Normocephalic, atraumatic, sclera non-icteric. Neck: No JVD or  bruits. Cardiac: Irregularly irregular, rate controlled, no murmurs, rubs, or gallops.  Respiratory: Clear to auscultation bilaterally. Breathing is unlabored. GI: Soft, nontender, non-distended, BS +x 4. MS: no deformity. Extremities: No clubbing or cyanosis. + edema of R forefoot otherwise bandaged, no LLE edema. Distal pedal pulses are 2+ and equal bilaterally. Neuro:  AAOx3. Follows commands. Psych:  Responds to questions appropriately with a normal affect.  Labs    High Sensitivity Troponin:  No results for input(s): "TROPONINIHS" in the last 720 hours.    Cardiac EnzymesNo results for input(s): "TROPONINI" in the last 168 hours. No results for input(s): "TROPIPOC" in the last 168 hours.   Chemistry Recent Labs  Lab 06/28/22 0336 06/29/22 0251 06/30/22 0435  NA 134* 131* 131*  K 3.9 4.0 3.5  CL 97* 96* 94*  CO2 27 25 28   GLUCOSE 113* 144* 95  BUN 24* 21 23  CREATININE 1.02* 0.75 1.03*  CALCIUM 8.9 8.7* 8.8*  GFRNONAA 53* >60 52*  ANIONGAP 10 10 9      Hematology Recent Labs  Lab 06/27/22 0454 06/29/22 0251 06/30/22 0435  WBC 9.1 9.3 9.9  RBC 3.85* 3.00* 2.91*  HGB 11.7* 9.1* 8.9*  HCT 35.9* 28.6* 27.2*  MCV 93.2 95.3 93.5  MCH 30.4 30.3 30.6  MCHC 32.6 31.8 32.7  RDW 12.5 12.4 12.7  PLT 248 180 223    BNP Recent Labs  Lab 06/26/22 1505  BNP 490.6*     DDimer No results for input(s): "DDIMER" in the last 168 hours.   Radiology    ECHOCARDIOGRAM COMPLETE  Result Date: 06/28/2022    ECHOCARDIOGRAM REPORT   Patient Name:   DONYALE MISENER Date of Exam: 06/28/2022 Medical Rec #:  [id number]       Height:       65.0 in Accession #:    [id number]      Weight:       160.9 lb Date of Birth:  [birth year]       BSA:          1.804 m Patient Age:    9 years        BP:           104/52 mmHg Patient Gender: F               HR:           61 bpm. Exam Location:  Inpatient Procedure: 2D Echo Indications:    atrial fibrillation  History:        Patient has prior history  of Echocardiogram examinations, most                 recent 06/24/2020. Arrythmias:LBBB; Risk Factors:Hypertension.  Sonographer:    Johny Chess RDCS Referring Phys: [place]  1. Left ventricular ejection fraction, by estimation, is 25 to 30%. The left ventricle has severely decreased function. The left ventricle demonstrates global hypokinesis with septal-lateral dyssynchrony consistent with LBBB. Left ventricular diastolic parameters are indeterminate.  2. Right ventricular systolic function is mildly reduced. The right ventricular size is normal. There is mildly elevated pulmonary artery systolic pressure. The estimated right ventricular systolic pressure is 123XX123 mmHg.  3. Left atrial size was moderately dilated.  4. Right atrial size was mildly dilated.  5. The mitral valve is normal in structure. No evidence of mitral valve regurgitation. No evidence of mitral stenosis.  6. The aortic valve is tricuspid. There is mild calcification of the aortic valve. Aortic valve regurgitation is mild. No aortic stenosis is present.  7. The inferior vena cava is normal in size with greater than 50% respiratory variability, suggesting right atrial pressure of 3 mmHg.  8. The patient was in atrial fibrillation. FINDINGS  Left Ventricle: Left ventricular ejection fraction, by estimation, is 25 to 30%. The left ventricle has severely decreased function. The left ventricle demonstrates global hypokinesis. The left ventricular internal cavity size was normal in size. There is no left ventricular hypertrophy. Left ventricular diastolic parameters are indeterminate. Right Ventricle: The right ventricular size is normal. No increase in right ventricular wall thickness. Right ventricular systolic function is mildly reduced. There is mildly elevated pulmonary artery systolic pressure. The tricuspid regurgitant velocity  is 2.93 m/s, and with an assumed right atrial pressure of 3 mmHg, the estimated right  ventricular systolic pressure is 123XX123 mmHg. Left Atrium: Left atrial size was moderately dilated. Right Atrium: Right atrial size was mildly dilated. Pericardium: There is no evidence of pericardial effusion. Mitral Valve: The mitral valve is normal in structure. No evidence of mitral valve regurgitation. No evidence of mitral valve stenosis. Tricuspid Valve: The tricuspid valve is normal in  structure. Tricuspid valve regurgitation is mild. Aortic Valve: The aortic valve is tricuspid. There is mild calcification of the aortic valve. Aortic valve regurgitation is mild. No aortic stenosis is present. Pulmonic Valve: The pulmonic valve was normal in structure. Pulmonic valve regurgitation is not visualized. Aorta: The aortic root is normal in size and structure. Venous: The inferior vena cava is normal in size with greater than 50% respiratory variability, suggesting right atrial pressure of 3 mmHg. IAS/Shunts: No atrial level shunt detected by color flow Doppler.  LEFT VENTRICLE PLAX 2D LVIDd:         5.00 cm LVIDs:         4.10 cm LV PW:         0.80 cm LV IVS:        1.00 cm LVOT diam:     1.80 cm LV SV:         39 LV SV Index:   22 LVOT Area:     2.54 cm  RIGHT VENTRICLE          IVC RV Basal diam:  2.80 cm  IVC diam: 1.40 cm TAPSE (M-mode): 1.2 cm LEFT ATRIUM             Index        RIGHT ATRIUM           Index LA diam:        4.80 cm 2.66 cm/m   RA Area:     19.20 cm LA Vol (A2C):   98.5 ml 54.61 ml/m  RA Volume:   51.00 ml  28.28 ml/m LA Vol (A4C):   80.6 ml 44.69 ml/m LA Biplane Vol: 88.9 ml 49.29 ml/m  AORTIC VALVE LVOT Vmax:   77.30 cm/s LVOT Vmean:  51.700 cm/s LVOT VTI:    0.154 m  AORTA Ao Root diam: 3.20 cm Ao Asc diam:  3.40 cm TRICUSPID VALVE TR Peak grad:   34.3 mmHg TR Vmax:        293.00 cm/s  SHUNTS Systemic VTI:  0.15 m Systemic Diam: 1.80 cm Dalton McleanMD Electronically signed by Franki Monte Signature Date/Time: 06/28/2022/4:21:44 PM    Final    DG Tibia/Fibula Right Port  Result  Date: 06/28/2022 CLINICAL DATA:  Right tibial ORIF EXAM: PORTABLE RIGHT TIBIA AND FIBULA - 2 VIEW COMPARISON:  06/26/2022 FINDINGS: Interval ORIF of comminuted proximal tibial diaphyseal fracture with lateral sideplate and screw fixation construct. Improved fracture alignment. Fibular diaphyseal fracture alignment is also improved. Expected postoperative changes within the overlying soft tissues. IMPRESSION: Interval ORIF of comminuted tibial diaphyseal fracture with improved fracture alignment. Electronically Signed   By: Davina Poke D.O.   On: 06/28/2022 14:35   DG Tibia/Fibula Right  Result Date: 06/28/2022 CLINICAL DATA:  Fluoroscopic assistance for internal fixation EXAM: RIGHT TIBIA AND FIBULA - 2 VIEW COMPARISON:  06/26/2022 FINDINGS: Fluoroscopic images show internal fixation of comminuted fracture of shaft of right tibia with metallic plate and multiple screws. There is previous right knee arthroplasty. Fluoroscopy time 55 seconds. Radiation dose 1.68 mGy. IMPRESSION: Fluoroscopic assistance was provided for internal fixation of fracture of right tibia. Electronically Signed   By: Elmer Picker M.D.   On: 06/28/2022 13:46   DG C-Arm 1-60 Min-No Report  Result Date: 06/28/2022 Fluoroscopy was utilized by the requesting physician.  No radiographic interpretation.    Cardiac Studies   2d echo 06/28/22  1. Left ventricular ejection fraction, by estimation, is 25 to 30%. The  left ventricle has  severely decreased function. The left ventricle  demonstrates global hypokinesis with septal-lateral dyssynchrony  consistent with LBBB. Left ventricular diastolic  parameters are indeterminate.   2. Right ventricular systolic function is mildly reduced. The right  ventricular size is normal. There is mildly elevated pulmonary artery  systolic pressure. The estimated right ventricular systolic pressure is  123XX123 mmHg.   3. Left atrial size was moderately dilated.   4. Right atrial size was  mildly dilated.   5. The mitral valve is normal in structure. No evidence of mitral valve  regurgitation. No evidence of mitral stenosis.   6. The aortic valve is tricuspid. There is mild calcification of the  aortic valve. Aortic valve regurgitation is mild. No aortic stenosis is  present.   7. The inferior vena cava is normal in size with greater than 50%  respiratory variability, suggesting right atrial pressure of 3 mmHg.   8. The patient was in atrial fibrillation.   Patient Profile     89 y.o. 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 She was admitted with mechanical fall trying to kick a trash bag and suffered right periprosthetic proximal tibia fracture.  She was noted to be in AF RVR so surgery postponed and cardiology consulted.   Assessment & Plan    1. Mechanical fall with right periprosthetic proximal tibia fracture - s/p ORIF 06/28/22, tolerated surgery well   2. Longstanding persistent atrial fibrillation with RVR this admission - home regimen includes digoxin 0.0625mg  daily and Toprol 150mg  daily which is presently written to continue  - PM digoxin level 3/17 was 0.5 - TSH wnl earlier this month - suspected that pain and NPO status was driving RVR since patient is in atrial fibrillation at baseline, rapid rate reflects deviation from baseline - treated with IV amiodarone briefly with improvement - HR back to baseline 80s-90s similar to OP  values - restarted on Eliquis 2.5mg  BID - though she meets criteria for 5mg  BID dosing by weight/Cr, historically has been maintained on 2.5mg  BID dose in setting of prior RPH on warfarin, and now falls - OK to continue lower dose per Dr. Judeth Cornfield review   3. Chronic HFrEF - echo 06/28/22 EF 25-30%, global HK, LBBB, mildly reduced RVSF, mildly elevated PASP, moderate LAE, mild RAE - torsemide 10mg  daily resumed - prior OP weight 05/20/22 152lb, here at 168lb though labs show declining sodium and chloride. Question to what degree her post-op bandaging and fluid shifts are contributing - will review diuretic regimen with MD - BP remains on softer side but denies sx with this - OK to give metoprolol as it will be important to help control HR but asked nurse to hold off torsemide pending MD eval   4. LBBB - chronic, no bradycardia seen  5. Anemia - per IM  6. Hyponatremia - per medicine  Has OV with Dr. Sallyanne Kuster in 11/2022, will keep but also have arranged closer post-hospital f/u with Diona Browner NP on 07/15/22, appt placed on AVS.  For questions or updates, please contact Elsie Please consult [email or web address] for contact info under Cardiology/STEMI.  Signed, Charlie Pitter, PA-C 06/30/2022, 9:19 AM

## 2022-06-30 NOTE — Progress Notes (Signed)
Occupational Therapy Treatment Patient Details Name: [name] MRN: [id number] DOB: 1930-12-09 Today's Date: 06/30/2022   History of present illness Pt is an 89 y/o female admitted 3/17 after a fall with fx of R tibia. Pt underwent ORIF of RLE on 3/19. PMH: anemia, a fib with RVR, DJD, GERD, c diff, IBS, osteoporosis, sciatica, L hemi hip arthroplasty (2021).   OT comments  Pt progressing well towards OT goals. Focused on standing balance for LB ADLs and safe ADL/transfer strategies given current balance deficits. Pt able to progress standing transfers from Min A to min guard during session though still at risk for falls. Family present, plan to use gait belt for transfers and plan to provide 24/7 assist as needed at home. DC recs remain appropriate and pt functionally appropriate for DC home once medically cleared.    Recommendations for follow up therapy are one component of a multi-disciplinary discharge planning process, led by the attending physician.  Recommendations may be updated based on patient status, additional functional criteria and insurance authorization.    Follow Up Recommendations  Home health OT     Assistance Recommended at Discharge Frequent or constant Supervision/Assistance  Patient can return home with the following  A little help with walking and/or transfers;A little help with bathing/dressing/bathroom;Assistance with cooking/housework;Help with stairs or ramp for entrance   Equipment Recommendations  None recommended by OT    Recommendations for Other Services      Precautions / Restrictions Precautions Precautions: Fall Restrictions Weight Bearing Restrictions: Yes RLE Weight Bearing: Weight bearing as tolerated Other Position/Activity Restrictions: WBAT transfers only       Mobility Bed Mobility               General bed mobility comments: up in chair    Transfers Overall transfer level: Needs assistance Equipment used: Rolling  walker (2 wheels) Transfers: Sit to/from Stand Sit to Stand: Min guard, Min assist           General transfer comment: initially Min A with difficulty transitioning hands from armrest to RW handle. improved with second trial though close min guard still needed     Balance Overall balance assessment: Needs assistance, History of Falls Sitting-balance support: No upper extremity supported, Feet supported Sitting balance-Leahy Scale: Good     Standing balance support: Bilateral upper extremity supported, During functional activity Standing balance-Leahy Scale: Poor                             ADL either performed or assessed with clinical judgement   ADL Overall ADL's : Needs assistance/impaired                     Lower Body Dressing: Minimal assistance;Sit to/from stand Lower Body Dressing Details (indicate cue type and reason): able to manage socks. asssessed balance with one UE support in standing with unsteadiness noted - will likely need family assist to don over waist in standing. also educated on lateral lean possibility               General ADL Comments: Emphasis on balance with LB ADLs, transferring to strong side when possible  (where to place w/c)    Extremity/Trunk Assessment Upper Extremity Assessment Upper Extremity Assessment: Overall WFL for tasks assessed   Lower Extremity Assessment Lower Extremity Assessment: Defer to PT evaluation        Vision   Vision Assessment?: No apparent visual deficits  Perception     Praxis      Cognition Arousal/Alertness: Awake/alert Behavior During Therapy: WFL for tasks assessed/performed, Restless Overall Cognitive Status: Within Functional Limits for tasks assessed                                          Exercises      Shoulder Instructions       General Comments Daughter and husband at bedside, discussed gait belt use, how to assist pt, etc.    Pertinent  Vitals/ Pain       Pain Assessment Pain Assessment: Faces Faces Pain Scale: Hurts a little bit Pain Location: RLE Pain Descriptors / Indicators: Guarding, Grimacing Pain Intervention(s): Monitored during session  Home Living                                          Prior Functioning/Environment              Frequency  Min 2X/week        Progress Toward Goals  OT Goals(current goals can now be found in the care plan section)  Progress towards OT goals: Progressing toward goals  Acute Rehab OT Goals Patient Stated Goal: home today OT Goal Formulation: With patient/family Time For Goal Achievement: 07/13/22 Potential to Achieve Goals: Good ADL Goals Pt Will Perform Lower Body Dressing: with modified independence;sit to/from stand;with adaptive equipment;sitting/lateral leans Pt Will Transfer to Toilet: with modified independence;stand pivot transfer Pt Will Perform Toileting - Clothing Manipulation and hygiene: with modified independence;sitting/lateral leans;sit to/from stand Additional ADL Goal #1: Pt to demo ability to gather ADL/IADL items MOD I from a wheelchair level  Plan Discharge plan remains appropriate    Co-evaluation                 AM-PAC OT "6 Clicks" Daily Activity     Outcome Measure   Help from another person eating meals?: None Help from another person taking care of personal grooming?: A Little Help from another person toileting, which includes using toliet, bedpan, or urinal?: A Little Help from another person bathing (including washing, rinsing, drying)?: A Little Help from another person to put on and taking off regular upper body clothing?: A Little Help from another person to put on and taking off regular lower body clothing?: A Little 6 Click Score: 19    End of Session Equipment Utilized During Treatment: Rolling walker (2 wheels)  OT Visit Diagnosis: Unsteadiness on feet (R26.81);Other abnormalities of gait and  mobility (R26.89);Muscle weakness (generalized) (M62.81)   Activity Tolerance Patient tolerated treatment well   Patient Left in chair;with call bell/phone within reach;with chair alarm set;with family/visitor present;with nursing/sitter in room   Nurse Communication Mobility status        Time: [id number] OT Time Calculation (min): 22 min  Charges: OT General Charges $OT Visit: 1 Visit OT Treatments $Self Care/Home Management : 8-22 mins  Malachy Chamber, OTR/L Acute Rehab Services Office: [phone]   Layla Maw 06/30/2022, 11:19 AM

## 2022-06-30 NOTE — Progress Notes (Signed)
  Transition of Care [facility]) Screening Note   Patient Details  Name: [name] Date of Birth: [birth year]   Transition of Care [facility]) CM/SW Contact:    Benard Halsted, LCSW Phone Number: 06/30/2022, 6:27 PM    Transition of Care Department [facility]) has reviewed patient and will watch for therapy needs post-surgery. We will continue to monitor patient advancement through interdisciplinary progression rounds. If new patient transition needs arise, please place a TOC consult.

## 2022-06-30 NOTE — Progress Notes (Signed)
Physical Therapy Treatment Patient Details Name: [name] MRN: [id number] DOB: 1930/12/02 Today's Date: 06/30/2022   History of Present Illness Pt is an 89 y/o female admitted 3/17 after a fall with fx of R tibia. Pt underwent ORIF of RLE on 3/19. PMH: anemia, a fib with RVR, DJD, GERD, c diff, IBS, osteoporosis, sciatica, L hemi hip arthroplasty (2021).    PT Comments    Pt tolerated today's session well, improving in mobility with decreased assist needed. Pt performing bed mobility with minG, transfers and sit<>stand with minA and RW, pt able to self-manage her RLE for pivot. Pt able to state her weight bearing precautions and that it is for transfers only. Pt cued for sit<>stand transfers as increased time needed to obtain upright position, cued for upright trunk and hip extension, improving with repetition. Pt's husband and daughter arriving during session, continuing to feel comfortable taking her home. Acute PT will continue to follow during admission to progress mobility, discharge plans remain appropriate.     Recommendations for follow up therapy are one component of a multi-disciplinary discharge planning process, led by the attending physician.  Recommendations may be updated based on patient status, additional functional criteria and insurance authorization.  Follow Up Recommendations  Home health PT     Assistance Recommended at Discharge Frequent or constant Supervision/Assistance  Patient can return home with the following A little help with walking and/or transfers;Help with stairs or ramp for entrance;Assist for transportation   Equipment Recommendations  None recommended by PT    Recommendations for Other Services       Precautions / Restrictions Precautions Precautions: Fall Restrictions Weight Bearing Restrictions: Yes RLE Weight Bearing: Weight bearing as tolerated Other Position/Activity Restrictions: WBAT transfers only     Mobility  Bed  Mobility Overal bed mobility: Needs Assistance Bed Mobility: Supine to Sit     Supine to sit: Min guard, HOB elevated     General bed mobility comments: minG for safety with use of bedrail    Transfers Overall transfer level: Needs assistance Equipment used: Rolling walker (2 wheels) Transfers: Sit to/from Stand, Bed to chair/wheelchair/BSC Sit to Stand: Min assist Stand pivot transfers: Min assist         General transfer comment: minA to stand from bed and chair x2 trials, increased time needed to obtain full upright posture and to get second hand up to RW handle with assist for balance. Pivoting well today, assist for balance during transfer and pt able to manage RLE without physical assist    Ambulation/Gait               General Gait Details: transfers only per WB orders   Stairs             Wheelchair Mobility    Modified Rankin (Stroke Patients Only)       Balance Overall balance assessment: Needs assistance, History of Falls Sitting-balance support: No upper extremity supported, Feet supported Sitting balance-Leahy Scale: Good     Standing balance support: Bilateral upper extremity supported, During functional activity Standing balance-Leahy Scale: Poor Standing balance comment: reliant on BUE support for standing and pivoting                            Cognition Arousal/Alertness: Awake/alert Behavior During Therapy: WFL for tasks assessed/performed, Restless Overall Cognitive Status: Within Functional Limits for tasks assessed  Exercises      General Comments General comments (skin integrity, edema, etc.): After transfer pt reports mild dizziness, BP 105/67(80) RN aware, pt reports improvement with seated rest break. Husband and daughter entering during session, all questions answered      Pertinent Vitals/Pain Pain Assessment Pain Assessment: No/denies pain     Home Living                          Prior Function            PT Goals (current goals can now be found in the care plan section) Acute Rehab PT Goals Patient Stated Goal: go home PT Goal Formulation: With patient/family Time For Goal Achievement: 07/13/22 Potential to Achieve Goals: Good Progress towards PT goals: Progressing toward goals    Frequency    Min 3X/week      PT Plan Current plan remains appropriate    Co-evaluation              AM-PAC PT "6 Clicks" Mobility   Outcome Measure  Help needed turning from your back to your side while in a flat bed without using bedrails?: A Little Help needed moving from lying on your back to sitting on the side of a flat bed without using bedrails?: A Little Help needed moving to and from a bed to a chair (including a wheelchair)?: A Little Help needed standing up from a chair using your arms (e.g., wheelchair or bedside chair)?: A Little Help needed to walk in hospital room?: Total Help needed climbing 3-5 steps with a railing? : Total 6 Click Score: 14    End of Session Equipment Utilized During Treatment: Gait belt Activity Tolerance: Patient tolerated treatment well Patient left: in chair;with call bell/phone within reach;with chair alarm set;with family/visitor present;with nursing/sitter in room;with SCD's reapplied Nurse Communication: Mobility status PT Visit Diagnosis: Other abnormalities of gait and mobility (R26.89);Muscle weakness (generalized) (M62.81);History of falling (Z91.81)     Time: [id number] PT Time Calculation (min) (ACUTE ONLY): 18 min  Charges:  $Therapeutic Activity: 8-22 mins                     Charlynne Cousins, PT DPT Acute Rehabilitation Services Office [phone]    Luvenia Heller 06/30/2022, 1:48 PM

## 2022-07-01 ENCOUNTER — Other Ambulatory Visit (HOSPITAL_COMMUNITY): Payer: Self-pay

## 2022-07-01 LAB — CBC
HCT: 26.9 % — ABNORMAL LOW (ref 36.0–46.0)
Hemoglobin: 9 g/dL — ABNORMAL LOW (ref 12.0–15.0)
MCH: 31 pg (ref 26.0–34.0)
MCHC: 33.5 g/dL (ref 30.0–36.0)
MCV: 92.8 fL (ref 80.0–100.0)
Platelets: 231 10*3/uL (ref 150–400)
RBC: 2.9 MIL/uL — ABNORMAL LOW (ref 3.87–5.11)
RDW: 13 % (ref 11.5–15.5)
WBC: 7.9 10*3/uL (ref 4.0–10.5)
nRBC: 0 % (ref 0.0–0.2)

## 2022-07-01 LAB — BASIC METABOLIC PANEL
Anion gap: 10 (ref 5–15)
BUN: 18 mg/dL (ref 8–23)
CO2: 29 mmol/L (ref 22–32)
Calcium: 8.5 mg/dL — ABNORMAL LOW (ref 8.9–10.3)
Chloride: 95 mmol/L — ABNORMAL LOW (ref 98–111)
Creatinine, Ser: 0.74 mg/dL (ref 0.44–1.00)
GFR, Estimated: >60 mL/min (ref >60)
Glucose, Bld: 99 mg/dL (ref 70–99)
Potassium: 3.4 mmol/L — ABNORMAL LOW (ref 3.5–5.1)
Sodium: 134 mmol/L — ABNORMAL LOW (ref 135–145)

## 2022-07-01 LAB — MAGNESIUM: Magnesium: 1.8 mg/dL (ref 1.7–2.4)

## 2022-07-01 MED ORDER — POTASSIUM CHLORIDE CRYS ER 20 MEQ PO TBCR
40.0000 meq | EXTENDED_RELEASE_TABLET | Freq: Once | ORAL | Status: AC
  Administered 2022-07-01: 40 meq via ORAL
  Filled 2022-07-01: qty 2

## 2022-07-01 NOTE — Progress Notes (Signed)
[facility] notified of dc order. Piv dcd to left hand. Site unremarkable.

## 2022-07-01 NOTE — Discharge Instructions (Addendum)
Orthopaedic Trauma Service Discharge Instructions   General Discharge Instructions  WEIGHT BEARING STATUS:Weightbearing for transfers only on the right lower extremity  RANGE OF MOTION/ACTIVITY: ok for unrestricted knee range of motion  Wound Care: You may remove your surgical dressing on Saturday 07/02/22. Incisions can be left open to air if there is no drainage. Once the incision is completely dry and without drainage, it may be left open to air out.  Showering may begin Saturday 07/02/22.  Clean incision gently with soap and water. Do not scrub incisions  DVT/PE prophylaxis:  Eliquis  Diet: as you were eating previously.  Can use over the counter stool softeners and bowel preparations, such as Miralax, to help with bowel movements.  Narcotics can be constipating.  Be sure to drink plenty of fluids  PAIN MEDICATION USE AND EXPECTATIONS  You have likely been given narcotic medications to help control your pain.  After a traumatic event that results in an fracture (broken bone) with or without surgery, it is ok to use narcotic pain medications to help control one's pain.  We understand that everyone responds to pain differently and each individual patient will be evaluated on a regular basis for the continued need for narcotic medications. Ideally, narcotic medication use should last no more than 6-8 weeks (coinciding with fracture healing).   As a patient it is your responsibility as well to monitor narcotic medication use and report the amount and frequency you use these medications when you come to your office visit.   We would also advise that if you are using narcotic medications, you should take a dose prior to therapy to maximize you participation.  IF YOU ARE ON NARCOTIC MEDICATIONS IT IS NOT PERMISSIBLE TO OPERATE A MOTOR VEHICLE (MOTORCYCLE/CAR/TRUCK/MOPED) OR HEAVY MACHINERY DO NOT MIX NARCOTICS WITH OTHER CNS (CENTRAL NERVOUS SYSTEM) DEPRESSANTS SUCH AS ALCOHOL   STOP SMOKING  OR USING NICOTINE PRODUCTS!!!!  As discussed nicotine severely impairs your body's ability to heal surgical and traumatic wounds but also impairs bone healing.  Wounds and bone heal by forming microscopic blood vessels (angiogenesis) and nicotine is a vasoconstrictor (essentially, shrinks blood vessels).  Therefore, if vasoconstriction occurs to these microscopic blood vessels they essentially disappear and are unable to deliver necessary nutrients to the healing tissue.  This is one modifiable factor that you can do to dramatically increase your chances of healing your injury.    (This means no smoking, no nicotine gum, patches, etc)  DO NOT USE NONSTEROIDAL ANTI-INFLAMMATORY DRUGS (NSAID'S)  Using products such as Advil (ibuprofen), Aleve (naproxen), Motrin (ibuprofen) for additional pain control during fracture healing can delay and/or prevent the healing response.  If you would like to take over the counter (OTC) medication, Tylenol (acetaminophen) is ok.  However, some narcotic medications that are given for pain control contain acetaminophen as well. Therefore, you should not exceed more than 4000 mg of tylenol in a day if you do not have liver disease.  Also note that there are may OTC medicines, such as cold medicines and allergy medicines that my contain tylenol as well.  If you have any questions about medications and/or interactions please ask your doctor/PA or your pharmacist.      ICE AND ELEVATE INJURED/OPERATIVE EXTREMITY  Using ice and elevating the injured extremity above your heart can help with swelling and pain control.  Icing in a pulsatile fashion, such as 20 minutes on and 20 minutes off, can be followed.    Do not place ice  directly on skin. Make sure there is a barrier between to skin and the ice pack.    Using frozen items such as frozen peas works well as the conform nicely to the are that needs to be iced.  USE AN ACE WRAP OR TED HOSE FOR SWELLING CONTROL  In addition to  icing and elevation, Ace wraps or TED hose are used to help limit and resolve swelling.  It is recommended to use Ace wraps or TED hose until you are informed to stop.    When using Ace Wraps start the wrapping distally (farthest away from the body) and wrap proximally (closer to the body)   Example: If you had surgery on your leg or thing and you do not have a splint on, start the ace wrap at the toes and work your way up to the thigh        If you had surgery on your upper extremity and do not have a splint on, start the ace wrap at your fingers and work your way up to the upper arm   [place]: [phone]   VISIT OUR WEBSITE FOR ADDITIONAL INFORMATION: [email or web address]    Discharge Wound Care Instructions  Do NOT apply any ointments, solutions or lotions to pin sites or surgical wounds.  These prevent needed drainage and even though solutions like hydrogen peroxide kill bacteria, they also damage cells lining the pin sites that help fight infection.  Applying lotions or ointments can keep the wounds moist and can cause them to breakdown and open up as well. This can increase the risk for infection. When in doubt call the office.  If any drainage is noted, use one layer of adaptic or Mepitel, then gauze, Kerlix, and an ace wrap. - These dressing supplies should be available at local medical supply stores [facility], [facility], etc) as well as Management consultant ([employer], [facility], [place], etc)  Once the incision is completely dry and without drainage, it may be left open to air out.  Showering may begin 36-48 hours later.  Cleaning gently with soap and water.

## 2022-07-01 NOTE — Plan of Care (Signed)
[name] is discharging home with spouse daughter and home health

## 2022-07-01 NOTE — Progress Notes (Signed)
Orthopaedic Trauma Progress Note  SUBJECTIVE: Doing well this AM. Pain controlled.  Progressing well with therapies. Feels ready for discharge home today.   OBJECTIVE:  Vitals:   07/01/22 0800 07/01/22 0925  BP: 121/82   Pulse: 84 84  Resp: 18   Temp: 98.2 F (36.8 C)   SpO2: 95%     General: Sitting up in bed, NAD Respiratory: No increased work of breathing.  RLE: Dressing changed, incisions CDI. New dressing applied. No significant tenderness throughout the lower leg. No calf tenderness. Tolerates gentle knee ROM. Able to wiggle toes a small amount.  Tolerates gentle passive ankle range of motion but does endorse some pulling and muscle tightness to the anterior tibia with this.  Endorses sensation over the toes. + DP pulse  IMAGING: Stable post op imaging.   LABS:  Results for orders placed or performed during the hospital encounter of 06/26/22 (from the past 24 hour(s))  Basic metabolic panel     Status: Abnormal   Collection Time: 07/01/22  6:18 AM  Result Value Ref Range   Sodium 134 (L) 135 - 145 mmol/L   Potassium 3.4 (L) 3.5 - 5.1 mmol/L   Chloride 95 (L) 98 - 111 mmol/L   CO2 29 22 - 32 mmol/L   Glucose, Bld 99 70 - 99 mg/dL   BUN 18 8 - 23 mg/dL   Creatinine, Ser 0.74 0.44 - 1.00 mg/dL   Calcium 8.5 (L) 8.9 - 10.3 mg/dL   GFR, Estimated >60 >60 mL/min   Anion gap 10 5 - 15  CBC     Status: Abnormal   Collection Time: 07/01/22  6:18 AM  Result Value Ref Range   WBC 7.9 4.0 - 10.5 K/uL   RBC 2.90 (L) 3.87 - 5.11 MIL/uL   Hemoglobin 9.0 (L) 12.0 - 15.0 g/dL   HCT 26.9 (L) 36.0 - 46.0 %   MCV 92.8 80.0 - 100.0 fL   MCH 31.0 26.0 - 34.0 pg   MCHC 33.5 30.0 - 36.0 g/dL   RDW 13.0 11.5 - 15.5 %   Platelets 231 150 - 400 K/uL   nRBC 0.0 0.0 - 0.2 %  Magnesium     Status: None   Collection Time: 07/01/22  6:18 AM  Result Value Ref Range   Magnesium 1.8 1.7 - 2.4 mg/dL    ASSESSMENT: [name] is a 89 y.o. female, 3 Days Post-Op s/p OPEN REDUCTION  INTERNAL FIXATION RIGHT TIBIA  CV/Blood loss: Acute blood loss anemia, Hgb 9.0 this AM. Stable from yesterday. Hemodynamically stable  PLAN: Weightbearing: WBAT RLE for transfers only. No walker ambulation on RLE ROM:  ok for unrestricted ROM  Incisional and dressing care: Changed today. Continue to change PRN Showering: Ok to begin showering and getting incisions wet 07/02/22 Orthopedic device(s): None  Pain management:  1. Tylenol 325-650 mg q 6 hours PRN 2. Norco 5-325 mg (1-2 tabs) q 6 hours PRN 3. Morphine 0.5-1 mg q 2 hours PRN VTE prophylaxis:  home dose Eliquis , SCDs ID:  Ancef 2gm post op completed Foley/Lines:  No foley, KVO IVFs Impediments to Fracture Healing: Vit d level 75, no additional supplementation needed  Dispo: PT/OT recommending home health therapies.  Okay for discharge from ortho standpoint once cleared by medicine team and therapies  D/C recommendations: -Resume home dose Tramadol 3 times daily as needed for pain control - Home dose Eliquis for DVT prophylaxis - No additional Vit D supplementation  Follow - up plan:  2 weeks after d/c for wound check, repeat x-rays, suture removal   Contact information:  Katha Hamming MD, Rushie Nyhan PA-C. After hours and holidays please check [email or web address] for group call information for [facility]   Gwinda Passe, PA-C [phone] (office) [email or web address]

## 2022-07-01 NOTE — Discharge Summary (Signed)
Physician Discharge Summary  [name] [id number] DOB: 1930-11-08 DOA: 06/26/2022  PCP: [name], MD  Admit date: 06/26/2022 Discharge date: 07/01/2022  Admitted From: Home  Discharge disposition: Home with home health  Recommendations for Outpatient Follow-Up:   Follow up with your primary care provider in one week.  Check CBC, BMP, magnesium in the next visit Follow-up with orthopedics as outpatient in 2 weeks.   Follow-up with cardiology as has been scheduled on 07/15/2022.  Discharge Diagnosis:   Principal Problem:   Tibia/fibula fracture Active Problems:   Fall at home, initial encounter   ATRIAL FIBRILLATION   Chronic anticoagulation   Chronic combined systolic and diastolic heart failure (HCC)   Essential hypertension   Hyponatremia   Hypochloremia   PAD (peripheral artery disease) (HCC)   Anxiety and depression   Discharge Condition: Improved.  Diet recommendation: Low sodium, heart healthy.    Wound care: Local site care.  Code status: Full.   History of Present Illness:   [name] is a 89 y.o. female with a history of atrial fibrillation, HFrEF, anxiety, depression, GERD presented to hospital after sustaining a fall at home. Patient uses walker at baseline but did lose her balance and had a fall. Was on Eliquis as outpatient. In the ED, patient had mild tachycardia. Labs were relatively within normal range. X-ray showed comminuted fracture of the proximal shaft of tibia with segmental fracture of the proximal shaft of the fibula on the right. Orthopedics was consulted and patient was admitted hospital for further evaluation and treatment.   Hospital Course:   Following conditions were addressed during hospitalization as listed below,  Right tibial/fibula fracture Status post mechanical fall.  X-rays on admission significant for comminuted fracture of the proximal shaft of the tibia and segmental fracture of proximal shaft of the  fibula/fibular neck. Orthopedic surgery recommended open reduction and internal fixation and patient underwent surgical fixation on 06/28/2022.  Seen by orthopedics today and was considered stable for disposition home.  Patient will follow-up with orthopedics as outpatient in 2 weeks..   Right foot pain History of neuropathy.  Foot x-ray significant for right proximal first phalangeal fracture.  Orthopedics on board.  Will need to follow-up with orthopedics as outpatient.   Longstanding persistent atrial fibrillation with RVR Likely RVR secondary to missing beta-blocker at home.  Cardiology followed the patient during hospitalization and was initially on amiodarone drip.  At this time patient will continue digoxin Eliquis and metoprolol  succinate 150 mg daily on discharge.. 2D echocardiogram showed reduced LV function at 25 to 30% with global hypokinesis.  Rate is controlled at this time.  Patient does have an an appointment to follow-up with cardiology on 07/15/2022.   Chronic combined systolic and diastolic heart failure Repeated 2D echocardiogram from 06/28/2022 with LV ejection fraction of 25 to 30% with global hypokinesis.  Continue , torsemide, digoxin, metoprolol.  Follow-up with cardiology as outpatient.   Essential hypertension -Continue metoprolol.  Blood pressure prior to discharge was 121/82   Hyponatremia Mild.  Latest sodium of 134.  Follow-up as outpatient.   PAD Follows with cardiology as outpatient.  Continue Eliquis metoprolol  Mild hypokalemia.  Potassium was 3.4.  Replenish orally prior to discharge. Will continue potassium on discharge.   Anxiety/Depression -Continue venlafaxine   Disposition.  At this time, patient is stable for disposition home with home health and Cardiology, PCP and orthopedic follow-up..  Medical Consultants:   Orthopedics Cardiology  Procedures:  open reduction and internal fixation right lower extremity fracture on 06/28/2022.   Subjective:   Today, patient feels okay.  Denies any nausea vomiting fever chills or overt pain.  Denies any chest pain, shortness of breath, dizziness lightheadedness.  Discharge Exam:   Vitals:   07/01/22 0800 07/01/22 0925  BP: 121/82   Pulse: 84 84  Resp: 18   Temp: 98.2 F (36.8 C)   SpO2: 95%    Vitals:   07/01/22 0400 07/01/22 0600 07/01/22 0800 07/01/22 0925  BP: (!) 82/41 113/67 121/82   Pulse: 83 89 84 84  Resp: 19 19 18    Temp: 97.9 F (36.6 C)  98.2 F (36.8 C)   TempSrc: Oral  Oral   SpO2: 92% 95% 95%   Weight:      Height:       Body mass index is 28.07 kg/m.   General: Alert awake, not in obvious distress elderly female, HENT: pupils equally reacting to light,  No scleral pallor or icterus noted. Oral mucosa is moist.  Chest:  Clear breath sounds.  No crackles or wheezes.  CVS: S1 &S2 heard. No murmur.  Regular rate and rhythm. Abdomen: Soft, nontender, nondistended.  Bowel sounds are heard.   Extremities: No cyanosis, clubbing or edema.  Peripheral pulses are palpable.  Right lower extremity status post ORIF with dressing. Psych: Alert, awake and oriented, normal mood CNS:  No cranial nerve deficits.  Moves all extremities. Skin: Warm and dry.  Lower extremity dressing.  The results of significant diagnostics from this hospitalization (including imaging, microbiology, ancillary and laboratory) are listed below for reference.     Diagnostic Studies:   DG Foot Complete Right  Result Date: 06/27/2022 CLINICAL DATA:  Right foot pain, initial encounter EXAM: RIGHT FOOT COMPLETE - 3+ VIEW COMPARISON:  None Available. FINDINGS: Postsurgical changes are noted in the distal tibia and fibula. Mildly angulated fracture of the first proximal phalanx is noted. No other fractures are seen. Splinting material is noted in place. IMPRESSION: First proximal phalangeal fracture. Electronically Signed   By: Inez Catalina M.D.   On: 06/27/2022 16:12   DG Ankle Right  Port  Result Date: 06/26/2022 CLINICAL DATA:  Fall with leg pain. EXAM: PORTABLE RIGHT ANKLE - 2 VIEW COMPARISON:  Report from right foot radiographs dated 02/07/2000. FINDINGS: No acute fracture around the ankle. No joint dislocation. Fixation hardware is seen in the distal fibula and in the medial malleolus. No significant soft tissue swelling around the ankle. IMPRESSION: 1. No acute osseous injury around the ankle. Electronically Signed   By: Zerita Boers M.D.   On: 06/26/2022 16:27   DG Knee 1-2 Views Right  Result Date: 06/26/2022 CLINICAL DATA:  Fall with leg pain. EXAM: RIGHT KNEE - 1-2 VIEW COMPARISON:  None Available. FINDINGS: There is a comminuted fracture of the proximal shaft of the tibia. There is a segmental fracture of the proximal shaft of the fibula and the fibular neck. The patient is status post total knee replacement. There is no knee joint dislocation or knee joint effusion. There is soft tissue swelling surrounding the leg. IMPRESSION: 1. Comminuted fracture of the proximal shaft of the tibia. 2. Segmental fracture of the proximal shaft of the fibula and fibular neck. Electronically Signed   By: Zerita Boers M.D.   On: 06/26/2022 16:27   DG Tibia/Fibula Right  Result Date: 06/26/2022 CLINICAL DATA:  Fall with leg pain. EXAM: RIGHT TIBIA AND FIBULA - 2 VIEW COMPARISON:  None  Available. FINDINGS: There is a comminuted fracture of the proximal shaft of the tibia. There is a segmental fracture of the proximal shaft of the fibula and the fibular neck. The patient is status post total knee replacement. Fixation hardware is seen in the distal fibula and in the medial malleolus. There is soft tissue swelling surrounding the leg. IMPRESSION: 1. Comminuted fracture of the proximal shaft of the tibia. 2. Segmental fracture of the proximal shaft of the fibula and fibular neck. Electronically Signed   By: Zerita Boers M.D.   On: 06/26/2022 16:25   CT Head Wo Contrast  Result Date:  06/26/2022 CLINICAL DATA:  Head and neck trauma. EXAM: CT HEAD WITHOUT CONTRAST CT CERVICAL SPINE WITHOUT CONTRAST TECHNIQUE: Multidetector CT imaging of the head and cervical spine was performed following the standard protocol without intravenous contrast. Multiplanar CT image reconstructions of the cervical spine were also generated. RADIATION DOSE REDUCTION: This exam was performed according to the departmental dose-optimization program which includes automated exposure control, adjustment of the mA and/or kV according to patient size and/or use of iterative reconstruction technique. COMPARISON:  Head CT 05/07/2008 FINDINGS: CT HEAD FINDINGS Brain: Ventricles, cisterns and other CSF spaces are normal. There is chronic ischemic microvascular disease. No mass, mass effect, shift of midline structures or acute hemorrhage. No evidence of acute infarction. Vascular: No hyperdense vessel or unexpected calcification. Skull: Normal. Negative for fracture or focal lesion. Sinuses/Orbits: No acute finding. Other: None. CT CERVICAL SPINE FINDINGS Alignment: No posttraumatic subluxation. Skull base and vertebrae: Vertebral body heights are maintained. There is mild moderate spondylosis throughout the cervical spine to include uncovertebral joint spurring and facet arthropathy. Mild bilateral neural foraminal narrowing at the C4-5 level with right-sided neural foraminal narrowing at the C5-6 level. Moderate right-sided neural foraminal narrowing at the C6-7 level. No acute fracture. Soft tissues and spinal canal: No prevertebral fluid or swelling. No visible canal hematoma. Disc levels: Moderate disc space narrowing at the C4-5, C5-6 and C6-7 levels. Upper chest: No acute findings. Other: None. IMPRESSION: 1. No acute brain injury. 2. Chronic ischemic microvascular disease. 3. No acute cervical spine injury. 4. Mild-to-moderate spondylosis throughout the cervical spine with disc disease at the C4-5, C5-6 and C6-7 levels.  Neural foraminal narrowing as described. Electronically Signed   By: Marin Olp M.D.   On: 06/26/2022 16:24   CT Cervical Spine Wo Contrast  Result Date: 06/26/2022 CLINICAL DATA:  Head and neck trauma. EXAM: CT HEAD WITHOUT CONTRAST CT CERVICAL SPINE WITHOUT CONTRAST TECHNIQUE: Multidetector CT imaging of the head and cervical spine was performed following the standard protocol without intravenous contrast. Multiplanar CT image reconstructions of the cervical spine were also generated. RADIATION DOSE REDUCTION: This exam was performed according to the departmental dose-optimization program which includes automated exposure control, adjustment of the mA and/or kV according to patient size and/or use of iterative reconstruction technique. COMPARISON:  Head CT 05/07/2008 FINDINGS: CT HEAD FINDINGS Brain: Ventricles, cisterns and other CSF spaces are normal. There is chronic ischemic microvascular disease. No mass, mass effect, shift of midline structures or acute hemorrhage. No evidence of acute infarction. Vascular: No hyperdense vessel or unexpected calcification. Skull: Normal. Negative for fracture or focal lesion. Sinuses/Orbits: No acute finding. Other: None. CT CERVICAL SPINE FINDINGS Alignment: No posttraumatic subluxation. Skull base and vertebrae: Vertebral body heights are maintained. There is mild moderate spondylosis throughout the cervical spine to include uncovertebral joint spurring and facet arthropathy. Mild bilateral neural foraminal narrowing at the C4-5 level with right-sided  neural foraminal narrowing at the C5-6 level. Moderate right-sided neural foraminal narrowing at the C6-7 level. No acute fracture. Soft tissues and spinal canal: No prevertebral fluid or swelling. No visible canal hematoma. Disc levels: Moderate disc space narrowing at the C4-5, C5-6 and C6-7 levels. Upper chest: No acute findings. Other: None. IMPRESSION: 1. No acute brain injury. 2. Chronic ischemic microvascular  disease. 3. No acute cervical spine injury. 4. Mild-to-moderate spondylosis throughout the cervical spine with disc disease at the C4-5, C5-6 and C6-7 levels. Neural foraminal narrowing as described. Electronically Signed   By: Marin Olp M.D.   On: 06/26/2022 16:24   DG Chest Portable 1 View  Result Date: 06/26/2022 CLINICAL DATA:  Fall from standing. EXAM: PORTABLE CHEST 1 VIEW COMPARISON:  Chest radiograph dated 06/23/2020. FINDINGS: The heart is enlarged. Vascular calcifications are seen in the aortic arch. The lungs are clear. There is no pleural effusion or pneumothorax. Emphysematous changes are noted. Degenerative changes are seen in the spine. IMPRESSION: 1. No active disease. 2. Cardiomegaly. Electronically Signed   By: Zerita Boers M.D.   On: 06/26/2022 16:22   DG Pelvis Portable  Result Date: 06/26/2022 CLINICAL DATA:  Fall EXAM: PORTABLE PELVIS 1-2 VIEWS COMPARISON:  08/05/2019, 08/03/2019 FINDINGS: SI joints are non widened. Pubic symphysis and rami appear intact. Status post left hip replacement with intact hardware and normal alignment. No definitive fracture is seen IMPRESSION: Status post left hip replacement. No acute osseous abnormality. Electronically Signed   By: Donavan Foil M.D.   On: 06/26/2022 16:22     Labs:   Basic Metabolic Panel: Recent Labs  Lab 06/26/22 1505 06/27/22 0454 06/28/22 0336 06/29/22 0251 06/30/22 0435 07/01/22 0618  NA 134* 135 134* 131* 131* 134*  K 3.9 4.5 3.9 4.0 3.5 3.4*  CL 96* 100 97* 96* 94* 95*  CO2 24 28 27 25 28 29   GLUCOSE 104* 133* 113* 144* 95 99  BUN 21 21 24* 21 23 18   CREATININE 0.82 0.86 1.02* 0.75 1.03* 0.74  CALCIUM 8.9 9.0 8.9 8.7* 8.8* 8.5*  MG 1.9  --  1.8  --   --  1.8   GFR Estimated Creatinine Clearance: 48.8 mL/min (by C-G formula based on SCr of 0.74 mg/dL). Liver Function Tests: No results for input(s): "AST", "ALT", "ALKPHOS", "BILITOT", "PROT", "ALBUMIN" in the last 168 hours. No results for input(s):  "LIPASE", "AMYLASE" in the last 168 hours. No results for input(s): "AMMONIA" in the last 168 hours. Coagulation profile No results for input(s): "INR", "PROTIME" in the last 168 hours.  CBC: Recent Labs  Lab 06/26/22 1505 06/27/22 0454 06/29/22 0251 06/30/22 0435 07/01/22 0618  WBC 9.8 9.1 9.3 9.9 7.9  NEUTROABS 7.1  --   --   --   --   HGB 12.5 11.7* 9.1* 8.9* 9.0*  HCT 38.6 35.9* 28.6* 27.2* 26.9*  MCV 95.3 93.2 95.3 93.5 92.8  PLT 255 248 180 223 231   Cardiac Enzymes: No results for input(s): "CKTOTAL", "CKMB", "CKMBINDEX", "TROPONINI" in the last 168 hours. BNP: Invalid input(s): "POCBNP" CBG: No results for input(s): "GLUCAP" in the last 168 hours. D-Dimer No results for input(s): "DDIMER" in the last 72 hours. Hgb A1c No results for input(s): "HGBA1C" in the last 72 hours. Lipid Profile No results for input(s): "CHOL", "HDL", "LDLCALC", "TRIG", "CHOLHDL", "LDLDIRECT" in the last 72 hours. Thyroid function studies No results for input(s): "TSH", "T4TOTAL", "T3FREE", "THYROIDAB" in the last 72 hours.  Invalid input(s): "FREET3" Anemia work up  No results for input(s): "VITAMINB12", "FOLATE", "FERRITIN", "TIBC", "IRON", "RETICCTPCT" in the last 72 hours. Microbiology No results found for this or any previous visit (from the past 240 hour(s)).   Discharge Instructions:     Allergies as of 07/01/2022       Reactions   Ace Inhibitors Cough   Warfarin Sodium Other (See Comments)   DOSE RELATED PHARMACOLOGIC EFFECT "bleed out"   Famotidine Other (See Comments)   GI upset   Vancomycin Nausea Only   Codeine Other (See Comments)   sedation   Delsym [dextromethorphan Polistirex Er] Other (See Comments)   dizziness   Lactose Intolerance (gi) Diarrhea   Lasix [furosemide] Diarrhea   Oral Lasix gives her diarrhea, tolerates IV Rx   Phenylephrine Palpitations, Other (See Comments)   Nasal spray- "likely increase in nasal congestion".     Sulfamethoxazole-trimethoprim Nausea And Vomiting   GI intolerance.        Medication List     TAKE these medications    acetaminophen 500 MG tablet Commonly known as: TYLENOL Take 1,000 mg by mouth in the morning and at bedtime.   calcium carbonate 500 MG chewable tablet Commonly known as: TUMS - dosed in mg elemental calcium Chew 500 mg by mouth 2 (two) times daily as needed for indigestion or heartburn.   cholecalciferol 25 MCG (1000 UNIT) tablet Commonly known as: VITAMIN D3 Take 1,000 Units by mouth in the morning.   cholestyramine 4 g packet Commonly known as: QUESTRAN TAKE 1 PACKET (4 G TOTAL) BY MOUTH DAILY WITH SUPPER.   Cranberry 180 MG Caps Take 180 mg by mouth daily in the afternoon.   digoxin 0.125 MG tablet Commonly known as: LANOXIN 1/4 tab by mouth daily. What changed:  how much to take how to take this when to take this additional instructions   Eliquis 2.5 MG Tabs tablet Generic drug: apixaban TAKE 1 TABLET BY MOUTH TWICE A DAY   fluticasone 50 MCG/ACT nasal spray Commonly known as: FLONASE Place 2 sprays into both nostrils daily.   Klor-Con M20 20 MEQ tablet Generic drug: potassium chloride SA TAKE 2 TABLETS BY MOUTH DAILY   loperamide 2 MG capsule Commonly known as: IMODIUM Take 2 mg by mouth 3 (three) times daily as needed for diarrhea or loose stools.   loratadine 10 MG tablet Commonly known as: CLARITIN Take 10 mg by mouth daily.   metoprolol succinate 100 MG 24 hr tablet Commonly known as: TOPROL-XL Take 1.5 tablets (150 mg total) by mouth daily.   pantoprazole 20 MG tablet Commonly known as: PROTONIX Take 1 tablet (20 mg total) by mouth daily.   torsemide 10 MG tablet Commonly known as: DEMADEX Take 1 tablet (10 mg total) by mouth daily. Dry weight is 149. If you are above this, take 20mg  torsemide.   traMADol 50 MG tablet Commonly known as: ULTRAM Take 1 tablet (50 mg total) by mouth 3 (three) times daily as  needed. What changed: reasons to take this   venlafaxine XR 37.5 MG 24 hr capsule Commonly known as: EFFEXOR-XR Take 2 capsules (75 mg total) by mouth every morning.        Follow-up Information     [name], MD Follow up in 1 week(s).   Specialty: Family Medicine Contact information: [address] [place] [address] [phone]         Shona Needles, MD Follow up in 2 week(s).   Specialty: Orthopedic Surgery Why: Wound check, x-ray,  postop follow-up. Contact information: [place] [address] [phone]         Lenna Sciara, NP Follow up.   Specialties: Cardiology, Family Medicine Why: [facility] location - cardiology follow-up has been arranged on Friday Jul 15, 2022 at 10:05 AM (Arrive by 9:50 AM). Raquel Sarna is one of our nurse practitioners that works with Dr. Sallyanne Kuster. We'll also keep your appointment with Dr. Sallyanne Kuster in August. Contact information: [address] [place] [address] [phone]                  Time coordinating discharge: 39 minutes  Signed:  Caresse Sedivy  [facility] 07/01/2022, 1:09 PM

## 2022-07-01 NOTE — Plan of Care (Signed)
Patient is discharging home today with daughter and home health in place with PT/OT/ RN.

## 2022-07-04 ENCOUNTER — Telehealth: Payer: Self-pay

## 2022-07-04 ENCOUNTER — Ambulatory Visit: Payer: Self-pay

## 2022-07-04 NOTE — Chronic Care Management (AMB) (Signed)
   07/04/2022  Meagin Mcclung Bielinski Sep 30, 1930 [id number]  Reason for Encounter: Change CCM enrollment status to previously enrolled.   Horris Latino RN Care Manager/Chronic Care Management [phone]

## 2022-07-04 NOTE — Transitions of Care (Post Inpatient/ED Visit) (Signed)
   07/04/2022  Name: [name] MRN: [id number] DOB: 01-03-1931  Today's TOC FU Call Status: Today's TOC FU Call Status:: Successful TOC FU Call Competed TOC FU Call Complete Date: 07/04/22  Transition Care Management Follow-up Telephone Call Date of Discharge: 07/01/22 Discharge Facility[facility] Type of Discharge: Inpatient Admission Primary Inpatient Discharge Diagnosis:: ORIF tibia How have you been since you were released from the hospital?: Same (I am starting to feel better.  My husband and daughter are very good at taking care of me.) Any questions or concerns?: No  Items Reviewed: Did you receive and understand the discharge instructions provided?: Yes Medications obtained and verified?: Yes (Medications Reviewed) Any new allergies since your discharge?: No Dietary orders reviewed?: Yes Type of Diet Ordered:: low sodium Do you have support at home?: Yes People in Home: spouse Name of Support/Comfort Primary Source: husband [name], daughter  Home Care and Equipment/Supplies: [place] Ordered?: No Any new equipment or medical supplies ordered?: No (has walker, w/c, BSC, shower chair)  Functional Questionnaire: Do you need assistance with bathing/showering or dressing?: Yes (husband helps) Do you need assistance with meal preparation?: Yes (husband helps) Do you need assistance with eating?: No Do you have difficulty maintaining continence: No Do you need assistance with getting out of bed/getting out of a chair/moving?: Yes (husband helps) Do you have difficulty managing or taking your medications?: No  Follow up appointments reviewed: PCP Follow-up appointment confirmed?: Yes Date of PCP follow-up appointment?: 07/07/22 Follow-up Provider: Dr. Damita Dunnings Specialist [facility] Follow-up appointment confirmed?: No Reason Specialist Follow-Up Not Confirmed: Patient has Specialist Provider Number and will Call for Appointment (pt plans to  call today to schedule follow up) Do you need transportation to your follow-up appointment?: No Do you understand care options if your condition(s) worsen?: Yes-patient verbalized understanding  SDOH Interventions Today    Flowsheet Row Most Recent Value  SDOH Interventions   Food Insecurity Interventions Intervention Not Indicated  Transportation Interventions Intervention Not Indicated      TOC Interventions Today    Flowsheet Row Most Recent Value  TOC Interventions   TOC Interventions Discussed/Reviewed TOC Interventions Discussed, Post discharge activity limitations per provider, Post op wound/incision care, S/S of infection        SIGNATURE Peter Garter RN, Jackquline Denmark, [place] Management Coordinator [place] Management [phone]

## 2022-07-06 ENCOUNTER — Encounter: Payer: Self-pay | Admitting: Family Medicine

## 2022-07-06 MED ORDER — TRAMADOL HCL 50 MG PO TABS: 50.0000 mg | ORAL_TABLET | Freq: Three times a day (TID) | ORAL | 0 refills | Status: DC | PRN

## 2022-07-07 ENCOUNTER — Ambulatory Visit (INDEPENDENT_AMBULATORY_CARE_PROVIDER_SITE_OTHER): Payer: Medicare Other | Admitting: Family Medicine

## 2022-07-07 ENCOUNTER — Encounter: Payer: Self-pay | Admitting: Family Medicine

## 2022-07-07 VITALS — BP 102/52 | HR 50 | Temp 97.1°F

## 2022-07-07 DIAGNOSIS — S82401D Unspecified fracture of shaft of right fibula, subsequent encounter for closed fracture with routine healing: Secondary | ICD-10-CM | POA: Diagnosis not present

## 2022-07-07 DIAGNOSIS — S82201D Unspecified fracture of shaft of right tibia, subsequent encounter for closed fracture with routine healing: Secondary | ICD-10-CM

## 2022-07-07 DIAGNOSIS — I4821 Permanent atrial fibrillation: Secondary | ICD-10-CM | POA: Diagnosis not present

## 2022-07-07 MED ORDER — ACETAMINOPHEN 500 MG PO TABS: 1000.0000 mg | ORAL_TABLET | Freq: Three times a day (TID) | ORAL | Status: DC | PRN

## 2022-07-07 NOTE — Progress Notes (Signed)
Inpatient follow-up.  Admitted with right lower leg fracture requiring surgical repair.  Back home now, with family caring for patient.  Significant other people related to disability and pain.  Currently in wheelchair, not ambulatory.  She has orthopedic follow-up pending.  Discussed pain control.  Has used tramadol in the past.  No adverse effect on medication previously.  She is eating and moving her bowels.  She does not have a fever.  She has had some serosanguineous fluid leaking from the wound but no frank bleeding.  She has bruising noted on her leg and had questions about that.  Inpatient course and previous imaging reviewed with patient.  History of anemia noted.  Follow-up labs pending.  Meds, vitals, and allergies reviewed.   ROS: Per HPI unless specifically indicated in ROS section   Nad [place] Neck supple, no LA IRR not tachy,  Ctab Abdomen soft.  Nontender. Right lower leg with variable diffuse bruising as expected.  Sutures appear intact at multiple sites on the lower leg without spreading erythema.  Minimal serosanguinous leak from the wound.  Skin well-perfused otherwise.  30 minutes were devoted to patient care in this encounter (this includes time spent reviewing the patient's file/history, interviewing and examining the patient, counseling/reviewing plan with patient).

## 2022-07-07 NOTE — Patient Instructions (Signed)
Go to the lab on the way out.   If you have mychart we'll likely use that to update you.    Take care.  Glad to see you. Let me know if you don't get a called about PT.

## 2022-07-08 LAB — BASIC METABOLIC PANEL
BUN: 18 mg/dL (ref 7–25)
CO2: 26 mmol/L (ref 20–32)
Calcium: 9 mg/dL (ref 8.6–10.4)
Chloride: 100 mmol/L (ref 98–110)
Creat: 0.83 mg/dL (ref 0.60–0.95)
Glucose, Bld: 102 mg/dL — ABNORMAL HIGH (ref 65–99)
Potassium: 4.5 mmol/L (ref 3.5–5.3)
Sodium: 138 mmol/L (ref 135–146)

## 2022-07-08 LAB — CBC WITH DIFFERENTIAL/PLATELET
Absolute Monocytes: 739 cells/uL (ref 200–950)
Basophils Absolute: 34 cells/uL (ref 0–200)
Basophils Relative: 0.4 %
Eosinophils Absolute: 420 cells/uL (ref 15–500)
Eosinophils Relative: 5 %
HCT: 29.9 % — ABNORMAL LOW (ref 35.0–45.0)
Hemoglobin: 10 g/dL — ABNORMAL LOW (ref 11.7–15.5)
Lymphs Abs: 1344 cells/uL (ref 850–3900)
MCH: 31.2 pg (ref 27.0–33.0)
MCHC: 33.4 g/dL (ref 32.0–36.0)
MCV: 93.1 fL (ref 80.0–100.0)
MPV: 11.7 fL (ref 7.5–12.5)
Monocytes Relative: 8.8 %
Neutro Abs: 5863 cells/uL (ref 1500–7800)
Neutrophils Relative %: 69.8 %
Platelets: 386 10*3/uL (ref 140–400)
RBC: 3.21 10*6/uL — ABNORMAL LOW (ref 3.80–5.10)
RDW: 12.8 % (ref 11.0–15.0)
Total Lymphocyte: 16 %
WBC: 8.4 10*3/uL (ref 3.8–10.8)

## 2022-07-08 LAB — MAGNESIUM: Magnesium: 1.7 mg/dL (ref 1.5–2.5)

## 2022-07-10 ENCOUNTER — Other Ambulatory Visit: Payer: Self-pay | Admitting: Family Medicine

## 2022-07-10 DIAGNOSIS — D649 Anemia, unspecified: Secondary | ICD-10-CM

## 2022-07-10 NOTE — Assessment & Plan Note (Signed)
She is going to have home health at home.  Family is caring for patient in the meantime.  Discussed using tramadol and Tylenol in a scheduled fashion with potential tramadol dose escalation if needed for pain control.  Elevate leg in the meantime.  She is going to follow-up with orthopedics for routine postop care and also for suture removal.  She does have a small amount of serosanguineous fluid draining from the wound but this was not unexpected and she can keep it clean and covered in the meantime.  See notes on follow-up labs given her history of anemia.

## 2022-07-11 ENCOUNTER — Telehealth: Payer: Self-pay | Admitting: Pharmacist

## 2022-07-11 ENCOUNTER — Ambulatory Visit: Payer: Medicare Other | Admitting: Pharmacist

## 2022-07-11 DIAGNOSIS — S82201D Unspecified fracture of shaft of right tibia, subsequent encounter for closed fracture with routine healing: Secondary | ICD-10-CM

## 2022-07-11 NOTE — Telephone Encounter (Signed)
I put in a new referral.  Please check with [facility] agency to see about getting patient set up.  Thanks.

## 2022-07-11 NOTE — Telephone Encounter (Signed)
I do not see a referral in EMR for HH/PT

## 2022-07-11 NOTE — Progress Notes (Unsigned)
Care Management & Coordination Services Pharmacy Note  07/11/2022 Name:  [name] MRN:  [id number] DOB:  03-Nov-1930  Summary: F/U visit -s/p leg fracture: pt reports ongoing pain, improving slightly; she needs assistance to move around her home; she is supposed to have home health but has not heard from agency yet -Depression: pt reports feeling very depressed/discouraged following recent leg fracture; discussed counseling, pt declined; discussed increasing or changing venlafaxine, pt declined -Osteoporosis: pt has refused DEXA scans previously but calculcated FRAX score is very high (39% major fracture, 20% hip fracture); she has hx of hip fracture and now tibia/fibula fracture; she is not willing to add more medication at this time  Recommendations/Changes made from today's visit: -No med changes -Coordinate with primary team re: HH order -Advised pt NOT to stop venlafaxine abruptly; call provider if she wishes to stop -Revisit osteoporosis treatment next visit (Prolia/Reclast preferred over bisphosphonate d/t hx significant GERD)  Follow up plan: -Pharmacist follow up televisit scheduled for 3 months -AWV 07/14/22; Cardiology appt 07/15/22; Ortho appt 07/19/22; Dr Fletcher Anon 08/02/22    Subjective: [name] is an 89 y.o. year old female who is a primary patient of Damita Dunnings, Elveria Rising, MD.  The care coordination team was consulted for assistance with disease management and care coordination needs.    Engaged with patient by telephone for follow up visit.  Recent office visits: 07/07/22 Dr Damita Dunnings OV: hospital f/u - setting up [facility]. Schedule Tylenol and tramadol for pain. Recheck labs 1 month.  06/17/22 Dr Damita Dunnings OV: paresthesia, PAD. Refer to Dr Fletcher Anon.   04/22/22 Dr Damita Dunnings OV: dysuria - Ucx Ecoli. Rx Keflex.  Recent consult visits: 05/20/22 Dr Orene Desanctis (Cardiology): f/u - narrow margin of compensation (4-5 lbs) between euvolemia and HF. Sometimes avoids diuretic d/t incontinence. Advised to  take torsemide anytime wt is > 149#. Intolerant to other GDMT (ARB, SGLT2, ARNI, AA)  05/12/22 Dr Harl Bowie (Cardiology): HF - BNP elevated. No changes.   Hospital visits: 06/26/22 - 07/01/22 Admission [facility]): tibia/fibula fracture d/t fall; surgery 3/19.    Objective:  Lab Results  Component Value Date   CREATININE 0.83 07/07/2022   BUN 18 07/07/2022   GFR 74.25 04/22/2022   EGFR 75 05/12/2022   GFRNONAA >60 07/01/2022   GFRAA 83 05/28/2020   NA 138 07/07/2022   K 4.5 07/07/2022   CALCIUM 9.0 07/07/2022   CO2 26 07/07/2022   GLUCOSE 102 (H) 07/07/2022    Lab Results  Component Value Date/Time   HGBA1C 5.4 09/22/2020 11:10 AM   HGBA1C 5.4 06/24/2020 12:19 AM   GFR 74.25 04/22/2022 01:45 PM   GFR 73.28 10/25/2021 11:54 AM    Last diabetic Eye exam: No results found for: "HMDIABEYEEXA"  Last diabetic Foot exam: No results found for: "HMDIABFOOTEX"   Lab Results  Component Value Date   CHOL 144 05/04/2021   HDL 60.50 05/04/2021   LDLCALC 54 05/04/2021   LDLDIRECT 58.0 12/27/2018   TRIG 143.0 05/04/2021   CHOLHDL 2 05/04/2021       Latest Ref Rng & Units 04/22/2022    1:45 PM 10/25/2021   11:54 AM 09/22/2021   12:15 PM  Hepatic Function  Total Protein 6.0 - 8.3 g/dL 6.6  6.5  6.6   Albumin 3.5 - 5.2 g/dL 4.0  3.9  3.9   AST 0 - 37 U/L 20  20  16    ALT 0 - 35 U/L 11  10  9    Alk Phosphatase 39 - 117  U/L 83  80  87   Total Bilirubin 0.2 - 1.2 mg/dL 0.7  0.5  0.6     Lab Results  Component Value Date/Time   TSH 2.54 06/17/2022 01:36 PM   TSH 3.65 05/04/2021 01:30 PM       Latest Ref Rng & Units 07/07/2022    3:40 PM 07/01/2022    6:18 AM 06/30/2022    4:35 AM  CBC  WBC 3.8 - 10.8 Thousand/uL 8.4  7.9  9.9   Hemoglobin 11.7 - 15.5 g/dL 10.0  9.0  8.9   Hematocrit 35.0 - 45.0 % 29.9  26.9  27.2   Platelets 140 - 400 Thousand/uL 386  231  223     Lab Results  Component Value Date/Time   VD25OH 75.84 06/26/2022 04:28 PM   VD25OH 36.99 03/28/2019 12:25 PM    VD25OH 14.37 (L) 12/27/2018 04:57 PM   VITAMINB12 559 06/17/2022 01:36 PM   VITAMINB12 625 07/10/2015 01:01 PM    Clinical ASCVD: Yes  The ASCVD Risk score (Arnett DK, et al., 2019) failed to calculate for the following reasons:   The 2019 ASCVD risk score is only valid for ages 5 to 14   The patient has a prior MI or stroke diagnosis    CHA2DS2/VAS Stroke Risk Points  Current as of today     6 >= 2 Points: High Risk     Points Metrics  1 Has Congestive Heart Failure:  Yes    Current as of today  1 Has Vascular Disease:  Yes     Current as of today  1 Has Hypertension:  Yes    Current as of today  2 Age:  89    Current as of today  0 Has Diabetes:  No    Current as of today  0 Had Stroke:  No  Had TIA:  No  Had Thromboembolism:  No    Current as of today  1 Female:  Yes    Current as of today         07/07/2022    3:01 PM 06/17/2022   12:44 PM 07/12/2021   12:01 PM  Depression screen PHQ 2/9  Decreased Interest 2 2 1   Down, Depressed, Hopeless 2 1 1   PHQ - 2 Score 4 3 2   Altered sleeping 1 2 0  Tired, decreased energy 3 2 1   Change in appetite 1 0 0  Feeling bad or failure about yourself  3 1 0  Trouble concentrating 3 2 0  Moving slowly or fidgety/restless 3 0 0  Suicidal thoughts 3 1 0  PHQ-9 Score 21 11 3   Difficult doing work/chores Very difficult Somewhat difficult Not difficult at all     Social History   Tobacco Use  Smoking Status Never  Smokeless Tobacco Never   BP Readings from Last 3 Encounters:  07/07/22 (!) 102/52  07/01/22 121/82  06/17/22 124/80   Pulse Readings from Last 3 Encounters:  07/07/22 (!) 50  07/01/22 84  06/17/22 69   Wt Readings from Last 3 Encounters:  06/30/22 168 lb 10.4 oz (76.5 kg)  06/17/22 153 lb (69.4 kg)  05/20/22 152 lb 3.2 oz (69 kg)   BMI Readings from Last 3 Encounters:  06/30/22 28.07 kg/m  06/17/22 25.46 kg/m  05/20/22 25.33 kg/m    Allergies  Allergen Reactions   Ace Inhibitors Cough   Warfarin  Sodium Other (See Comments)    DOSE RELATED PHARMACOLOGIC EFFECT "bleed out"  Famotidine Other (See Comments)    GI upset   Vancomycin Nausea Only   Codeine Other (See Comments)    sedation   Delsym [Dextromethorphan Polistirex Er] Other (See Comments)    dizziness   Lactose Intolerance (Gi) Diarrhea   Lasix [Furosemide] Diarrhea    Oral Lasix gives her diarrhea, tolerates IV Rx   Phenylephrine Palpitations and Other (See Comments)    Nasal spray- "likely increase in nasal congestion".    Sulfamethoxazole-Trimethoprim Nausea And Vomiting    GI intolerance.    Medications Reviewed Today     Reviewed by Charlton Haws, [facility] (Pharmacist) on 07/11/22 at 1433  Med List Status: <None>   Medication Order Taking? Sig Documenting Provider Last Dose Status Informant  acetaminophen (TYLENOL) 500 MG tablet [id number] Yes Take 2 tablets (1,000 mg total) by mouth every 8 (eight) hours as needed. [name], MD Taking Active   apixaban [facility] [place]) 2.5 MG TABS tablet [id number] Yes TAKE 1 TABLET BY MOUTH TWICE A DAY Croitoru, Mihai, MD Taking Active Self, Pharmacy Records  calcium carbonate (TUMS - DOSED IN MG ELEMENTAL CALCIUM) 500 MG chewable tablet [id number] Yes Chew 500 mg by mouth 2 (two) times daily as needed for indigestion or heartburn. [provider] Taking Active Self, Pharmacy Records  cholecalciferol (VITAMIN D3) 25 MCG (1000 UNIT) tablet [id number] Yes Take 1,000 Units by mouth in the morning. [provider] Taking Active Self, Pharmacy Records  cholestyramine (QUESTRAN) 4 g packet [id number] Yes TAKE 1 PACKET (4 G TOTAL) BY MOUTH DAILY WITH SUPPER. Gatha Mayer, MD Taking Active Self, Pharmacy Records  Cranberry 180 MG CAPS [id number] Yes Take 180 mg by mouth daily in the afternoon. [provider] Taking Active Self, Pharmacy Records  digoxin (LANOXIN) 0.125 MG tablet [id number] Yes 1/4 tab by mouth daily. [name], MD Taking Active Self,  Pharmacy Records  fluticasone [facility]) 50 MCG/ACT nasal spray [id number] Yes Place 2 sprays into both nostrils daily. [name], MD Taking Active Self, Pharmacy Records           Med Note [name]   Fri Jun 17, 2022  1:11 PM)    KLOR-CON M20 20 MEQ tablet [id number] Yes TAKE 2 TABLETS BY MOUTH DAILY [name], MD Taking Active Self, Pharmacy Records  loperamide (IMODIUM) 2 MG capsule [id number] Yes Take 2 mg by mouth 3 (three) times daily as needed for diarrhea or loose stools.  [provider] Taking Active Self, Pharmacy Records  loratadine (CLARITIN) 10 MG tablet [id number] Yes Take 10 mg by mouth daily. [provider] Taking Active Self, Pharmacy Records  metoprolol succinate (TOPROL-XL) 100 MG 24 hr tablet [id number] Yes Take 1.5 tablets (150 mg total) by mouth daily. [name], MD Taking Active Self, Pharmacy Records  pantoprazole (PROTONIX) 20 MG tablet [id number] Yes Take 1 tablet (20 mg total) by mouth daily. [name], MD Taking Active Self, Pharmacy Records  torsemide [facility]) 10 MG tablet [id number] Yes Take 1 tablet (10 mg total) by mouth daily. Dry weight is 149. If you are above this, take 20mg  torsemide. Croitoru, Mihai, MD Taking Active Self, Pharmacy Records  traMADol (ULTRAM) 50 MG tablet [id number] Yes Take 1 tablet (50 mg total) by mouth 3 (three) times daily as needed for moderate pain or severe pain. [name], MD Taking Active   venlafaxine XR (EFFEXOR-XR) 37.5 MG 24 hr capsule [id number] Yes Take 2 capsules (75 mg total) by mouth every morning. Elsie Stain  S, MD Taking Active Self, Pharmacy Records            SDOH:  (Social Determinants of Health) assessments and interventions performed: No SDOH Interventions    Flowsheet Row Office Visit from 07/07/2022 in [place] [facility] [place] Telephone from 07/04/2022 in [place] Office Visit from 06/17/2022 in  [place] at [place] from 07/12/2021 in [place] at [place] ED to Hosp-Admission (Discharged) from 06/23/2020 in [place] HF PCU  SDOH Interventions       Food Insecurity Interventions -- Intervention Not Indicated -- Intervention Not Indicated Intervention Not Indicated  Housing Interventions -- -- -- Intervention Not Indicated Intervention Not Indicated  Transportation Interventions -- Intervention Not Indicated -- Intervention Not Indicated Intervention Not Indicated  Depression Interventions/Treatment  Counseling -- Counseling Medication --  Financial Strain Interventions -- -- -- Intervention Not Indicated Intervention Not Indicated  Physical Activity Interventions -- -- -- Other (Comments)  [Encouraged pt to try chair exercises.] Intervention Not Indicated  Stress Interventions -- -- -- Provide Counseling  [Discussed Counseling services but pt declined at this time.] --  Social Connections Interventions -- -- -- Intervention Not Indicated --       Medication Assistance: None required.  Patient affirms current coverage meets needs.  Medication Access: Within the past 30 days, how often has patient missed a dose of medication? 0 Is a pillbox or other method used to improve adherence? Yes  Factors that may affect medication adherence? no barriers identified Are meds synced by current pharmacy? No  Are meds delivered by current pharmacy? No  Does patient experience delays in picking up medications due to transportation concerns? No   [facility] Services Reviewed: Is patient disadvantaged to use [facility]?: No  Current Rx insurance plan: [facility] Name and location of [id number] [place], [address] [place] [address] Phone: [phone] Fax: [phone]  [facility] Pharm[facility] services reviewed with patient today?: No  Patient requests to  transfer care to [facility] Pharmacy?: No  Reason patient declined to change pharmacies: Not mentioned at this visit  Compliance/Adherence/Medication fill history: Care Gaps: None  Star-Rating Drugs: None   Assessment/Plan  Persistent Atrial Fibrillation (Goal: prevent stroke and major bleeding) -Controlled - pt endorses compliance with medications and denies issues; recent digoxin level was not elevated (04/2021) -CHADSVASC: 6; chronic LBBB; hx of falls -Age 65, wt 65 kg, Cr 0.72 -Current treatment: Metoprolol succinate 100 mg 1.5 tab daily -Appropriate, Effective, Safe, Accessible Digoxin 0.125 mg - 1/4 tab daily - Appropriate, Effective, Safe, Accessible Eliquis 2.5 mg BID - Appropriate, Effective, Safe, Accessible -Medications previously tried: amiodarone -Reviewed medications; pt would like to get off of digoxin, discussed increased risk of HF exacerbation when stopping digoxin, she can discuss with cardiology -Recommended to continue current medication  Heart Failure / Hypertension (Goal: BP < 140/90, prevent exacerbations) -Controlled -pt has not needed torsemide in a few weeks -Last ejection fraction: 25-30% (Date: 06/2022) -HF type: HFrEF (EF < 40%); NYHA Class: II -Current home BP/HR readings: SBP 80s-90s; HR 100 -Home wt: 150# (dry weight 149#) -Current treatment: Metoprolol succinate 100 mg 1.5 tab daily - Appropriate, Effective, Safe, Accessible Digoxin 0.125 mg - 1/4 tab daily - Appropriate, Effective, Safe, Accessible Torsemide 10 mg daily PRN - Appropriate, Effective, Safe, Accessible Potassium chloride 20 mEq - 2 tab daily - Appropriate, Effective, Safe, Accessible -Medications  previously tried: [place] (weakness), Ace-inhibitor (cough), losartan (low BP) ; cardiologist advised avoid Entresto, spironolactone due to low BP -Educated on BP goals and benefits of medications for prevention of heart attack, stroke and kidney damage;Importance of home blood pressure  monitoring;   -Advised to take the torsemide for wt > 150#; advised to limit salt intake -Advised to check BP when feeling dizzy; if it is low, contact PCP or cardiologist for med adjustment -Recommend to continue current medication  Depression (Goal: manage symptoms) -Not ideally controlled - pt reports she feels very depressed and discouraged by her circumstances after leg fracture; she does not think medication is doing anything -PHQ9: 21 (06/2022) - severe depression -Connected with PCP for mental health support -Current treatment: Venlafaxine 37.5 mg - 2 tab daily - Appropriate, Query Effective -Medications previously tried: sertraline, bupropion -Discussed counseling benefits at length, pt is adamant that this would not be helpful for her -Discussed option of increasing or changing medication; pt feels neither would be helpful -Advised NOT to stop taking venlafaxine abruptly; if she wishes to come off it, contact provider for tapering plan -Recommend to continue current medication  Osteoporosis (Goal prevent fractures) -Not ideally controlled - pt has no DEXA on file and is not taking medication to improve bone density -Dx from chart review 2012. Hx hip fracture and multiple falls. Pt has declined follow up DEXA scans previously -FRAX (07/11/22): 10-year risk major osteoporotic fracture: 39%; hip fracture: 20% -Current treatment  Vitamin D 1000 IU daily -Assessed her history of hip fracture - appears to be fragility fracture, which qualifies her for treatment of osteoporosis; given significant GI and reflux issues would avoid oral bisphosphonates; she is a good candidate for Prolia or zoledronic acid injections -Recommended DEXA scan to evaluate bone density - pt declined -Recommend weight-bearing and muscle strengthening exercises for building and maintaining bone density. -Advised to start a calcium supplement daily (at least 600 mg) -Consider starting Prolia injections given hx of  hip fracture, hx multiple falls   IBS-D / Lymphocytic Colitis (Goal: manage symtoms) -Controlled - pt reports diarrhea improved with cholestyramine -colitis flare as of 03/09/21; follows with GI;  -Current treatment  Cholestyramine 4 g daily - Appropriate, Effective, Safe, Accessible Loperamide 2 mg PRN -takes 2-3 per day - Appropriate, Effective, Safe, Accessible -Medications previously used: budesonide (flares), prednisone (flares) -Reviewed medications; it is reasonable to use OTC remedies at recommended doses during flare -Advised to follow up with GI for further mgmt of IBS  GERD (Goal: minimize symptoms of reflux ) -Not ideally controlled - pt reports she stopped pantoprazole recently because she thought it was making her voice hoarse/drying out her throat; she will use pepcid or tums occasionally -Current treatment  Pantoprazole 20 mg daily - Appropriate, Effective, Safe, Accessible Tums PRN - Appropriate, Effective, Safe, Accessible -Medications previously tried: famotidine -Reviewed hoarseness can also be a sign of GERD; advised to use famotidine more consistenly/as preventative   OAB (Goal: reduce urinary frequency) -Not ideally controlled - pt reports waking up several times per night to urinate, she feels like she is not completely emptying bladder -Pt stops drinking fluids after about 7 pm -Preferred formulary options: oxybutynin (Tier 1), tolterodine, trospium, solifenacin. Discussed these options and potential side effects, pt reports she already has issues with dry eye and does not want to take another medication at this time -Current treatment  Cranberry supplement -Medications previously tried: oxybutynin, Myrbetriq (cost) -Advised to contact PCP if issue worsens  Neuropathy (Goal: manage symptoms) -  Not ideally controlled - pt reports nerve pain in feet and occasional redness in feet, she reports feet are not particularly cool or warm to touch; -pt ordered special  neuropathy cream and reports it is helping a lot; she also reports she was prescribed tramadol and takes 1/2 tablet PRN -Current treatment  Tramadol 50 mg - 1/2 tab PRN - Appropriate, Effective, Safe, Accessible -Medications previously tried: n/a  -Pt is ok continuing with current medication/cream and will contact PCP if needed  Hyperlipidemia: (LDL goal < 70) -Controlled - LDL 54 (04/2021) at goal; TRIG 143 -Hx MI. Noted statin intolerance in chart. -Current treatment: None -Educated on Cholesterol goals -Recommend to continue current medication  Health Maintenance -Vaccine gaps: Flu [facility]), Covid booster, TDAP -Hand arthritis - wears brace at night, takes Tylenol   Charlene Brooke, PharmD, Para March, CPP Clinical Pharmacist Practitioner [place] at [facility] [phone]

## 2022-07-11 NOTE — Telephone Encounter (Signed)
Home health PT was ordered at discharge 07/01/22 following fall/fracture; pt has not heard from [facility] agency yet. She would like update on HH.

## 2022-07-12 NOTE — Patient Instructions (Signed)
Visit Information  Phone number for Pharmacist: [phone]  Thank you for meeting with me to discuss your medications! Below is a summary of what we talked about during the visit:   Recommendations/Changes made from today's visit: -No med changes -Coordinate with primary team re: [place] order -Advised pt NOT to stop venlafaxine abruptly; call provider if she wishes to stop -Revisit osteoporosis treatment next visit (Prolia/Reclast preferred over bisphosphonate d/t hx significant GERD)  Follow up plan: -Pharmacist follow up televisit scheduled for 3 months -AWV 07/14/22; Cardiology appt 07/15/22; Ortho appt 07/19/22; Dr Fletcher Anon 08/02/22   Charlene Brooke, PharmD, [place] Clinical Pharmacist [facility] [phone]

## 2022-07-12 NOTE — Telephone Encounter (Signed)
Called patient let her know referral started will let us know if she has not received call by tomorrow to set up.

## 2022-07-14 ENCOUNTER — Telehealth: Payer: Self-pay | Admitting: Family Medicine

## 2022-07-14 NOTE — Telephone Encounter (Signed)
Patient called in and stated that her phone was out this morning and she wasn't able to get in contact with you. She stated that she would like for you to reach back out to her at your earliest convenience. Thank you!

## 2022-07-15 ENCOUNTER — Ambulatory Visit: Payer: Medicare Other | Admitting: Nurse Practitioner

## 2022-07-16 ENCOUNTER — Encounter: Payer: Self-pay | Admitting: Family Medicine

## 2022-07-16 ENCOUNTER — Other Ambulatory Visit: Payer: Self-pay | Admitting: Family Medicine

## 2022-07-18 NOTE — Telephone Encounter (Signed)
Dr Para March, This Referral did not fall into the [facility] Work queue Q due to being placed by [facility] Dept with [facility].  I will work on getting this placed and contact the patient tomorrow 07/19/2022  Being [facility] Medicare, these insurances are hard to get placed with [facility] but I will work on finding someone that can accept her.

## 2022-07-19 NOTE — Telephone Encounter (Signed)
Patient has been rescheduled.

## 2022-07-19 NOTE — Telephone Encounter (Signed)
Thanks.  Please notify pt.   

## 2022-07-25 DIAGNOSIS — S82201D Unspecified fracture of shaft of right tibia, subsequent encounter for closed fracture with routine healing: Secondary | ICD-10-CM | POA: Diagnosis not present

## 2022-07-31 ENCOUNTER — Other Ambulatory Visit: Payer: Self-pay | Admitting: Family Medicine

## 2022-08-02 ENCOUNTER — Institutional Professional Consult (permissible substitution): Payer: Medicare Other | Admitting: Cardiovascular Disease

## 2022-08-04 ENCOUNTER — Telehealth: Payer: Self-pay | Admitting: Family Medicine

## 2022-08-04 DIAGNOSIS — I4821 Permanent atrial fibrillation: Secondary | ICD-10-CM | POA: Diagnosis not present

## 2022-08-04 DIAGNOSIS — S82401D Unspecified fracture of shaft of right fibula, subsequent encounter for closed fracture with routine healing: Secondary | ICD-10-CM | POA: Diagnosis not present

## 2022-08-04 DIAGNOSIS — L89620 Pressure ulcer of left heel, unstageable: Secondary | ICD-10-CM | POA: Diagnosis not present

## 2022-08-04 DIAGNOSIS — S82201D Unspecified fracture of shaft of right tibia, subsequent encounter for closed fracture with routine healing: Secondary | ICD-10-CM | POA: Diagnosis not present

## 2022-08-04 DIAGNOSIS — M1990 Unspecified osteoarthritis, unspecified site: Secondary | ICD-10-CM | POA: Diagnosis not present

## 2022-08-04 DIAGNOSIS — D649 Anemia, unspecified: Secondary | ICD-10-CM | POA: Diagnosis not present

## 2022-08-04 DIAGNOSIS — Z7951 Long term (current) use of inhaled steroids: Secondary | ICD-10-CM | POA: Diagnosis not present

## 2022-08-04 DIAGNOSIS — W19XXXD Unspecified fall, subsequent encounter: Secondary | ICD-10-CM | POA: Diagnosis not present

## 2022-08-04 DIAGNOSIS — Z7901 Long term (current) use of anticoagulants: Secondary | ICD-10-CM | POA: Diagnosis not present

## 2022-08-04 DIAGNOSIS — Z556 Problems related to health literacy: Secondary | ICD-10-CM | POA: Diagnosis not present

## 2022-08-04 DIAGNOSIS — I509 Heart failure, unspecified: Secondary | ICD-10-CM | POA: Diagnosis not present

## 2022-08-04 DIAGNOSIS — Z9181 History of falling: Secondary | ICD-10-CM | POA: Diagnosis not present

## 2022-08-04 DIAGNOSIS — Z993 Dependence on wheelchair: Secondary | ICD-10-CM | POA: Diagnosis not present

## 2022-08-04 NOTE — Telephone Encounter (Addendum)
Please give the order.  I need update about R foot wounds. Thanks.

## 2022-08-04 NOTE — Telephone Encounter (Signed)
Home Health verbal orders Caller Name:Kendra Agency Name: [facility] number: [phone]  Requesting OT/PT/Skilled nursing/Social Work/Speech: skilled nursing evaluate and treat  Reason:2 wounds on bottom of right foot,unstageable,black ,draining(on heel)  Frequency:   Please forward to [facility] pool or providers CMA

## 2022-08-04 NOTE — Telephone Encounter (Signed)
Tried to call patient but no answer; LMTCB

## 2022-08-04 NOTE — Telephone Encounter (Signed)
Verbal orders left on secure voicemail.  °

## 2022-08-05 ENCOUNTER — Emergency Department (HOSPITAL_BASED_OUTPATIENT_CLINIC_OR_DEPARTMENT_OTHER)
Admission: EM | Admit: 2022-08-05 | Discharge: 2022-08-05 | Disposition: A | Discharge status: Home / Self Care | Payer: Medicare Other | Attending: Emergency Medicine | Admitting: Emergency Medicine

## 2022-08-05 ENCOUNTER — Other Ambulatory Visit: Payer: Self-pay

## 2022-08-05 ENCOUNTER — Encounter: Payer: Self-pay | Admitting: Family Medicine

## 2022-08-05 ENCOUNTER — Emergency Department (HOSPITAL_BASED_OUTPATIENT_CLINIC_OR_DEPARTMENT_OTHER): Payer: Medicare Other | Admitting: Radiology

## 2022-08-05 DIAGNOSIS — L89620 Pressure ulcer of left heel, unstageable: Secondary | ICD-10-CM | POA: Diagnosis not present

## 2022-08-05 DIAGNOSIS — M1990 Unspecified osteoarthritis, unspecified site: Secondary | ICD-10-CM | POA: Diagnosis not present

## 2022-08-05 DIAGNOSIS — X58XXXA Exposure to other specified factors, initial encounter: Secondary | ICD-10-CM | POA: Insufficient documentation

## 2022-08-05 DIAGNOSIS — Z556 Problems related to health literacy: Secondary | ICD-10-CM | POA: Diagnosis not present

## 2022-08-05 DIAGNOSIS — Z7901 Long term (current) use of anticoagulants: Secondary | ICD-10-CM | POA: Diagnosis not present

## 2022-08-05 DIAGNOSIS — I509 Heart failure, unspecified: Secondary | ICD-10-CM | POA: Diagnosis not present

## 2022-08-05 DIAGNOSIS — Z993 Dependence on wheelchair: Secondary | ICD-10-CM | POA: Diagnosis not present

## 2022-08-05 DIAGNOSIS — S82201D Unspecified fracture of shaft of right tibia, subsequent encounter for closed fracture with routine healing: Secondary | ICD-10-CM | POA: Diagnosis not present

## 2022-08-05 DIAGNOSIS — W19XXXD Unspecified fall, subsequent encounter: Secondary | ICD-10-CM | POA: Diagnosis not present

## 2022-08-05 DIAGNOSIS — S91301A Unspecified open wound, right foot, initial encounter: Secondary | ICD-10-CM | POA: Insufficient documentation

## 2022-08-05 DIAGNOSIS — S82401D Unspecified fracture of shaft of right fibula, subsequent encounter for closed fracture with routine healing: Secondary | ICD-10-CM | POA: Diagnosis not present

## 2022-08-05 DIAGNOSIS — Z9181 History of falling: Secondary | ICD-10-CM | POA: Diagnosis not present

## 2022-08-05 DIAGNOSIS — Z7951 Long term (current) use of inhaled steroids: Secondary | ICD-10-CM | POA: Diagnosis not present

## 2022-08-05 DIAGNOSIS — D649 Anemia, unspecified: Secondary | ICD-10-CM | POA: Diagnosis not present

## 2022-08-05 DIAGNOSIS — I4821 Permanent atrial fibrillation: Secondary | ICD-10-CM | POA: Diagnosis not present

## 2022-08-05 DIAGNOSIS — L97519 Non-pressure chronic ulcer of other part of right foot with unspecified severity: Secondary | ICD-10-CM | POA: Diagnosis not present

## 2022-08-05 LAB — CBC WITH DIFFERENTIAL/PLATELET
Abs Immature Granulocytes: 0.02 10*3/uL (ref 0.00–0.07)
Basophils Absolute: 0 10*3/uL (ref 0.0–0.1)
Basophils Relative: 0 %
Eosinophils Absolute: 0.1 10*3/uL (ref 0.0–0.5)
Eosinophils Relative: 2 %
HCT: 40.6 % (ref 36.0–46.0)
Hemoglobin: 12.9 g/dL (ref 12.0–15.0)
Immature Granulocytes: 0 %
Lymphocytes Relative: 24 %
Lymphs Abs: 1.8 10*3/uL (ref 0.7–4.0)
MCH: 31.4 pg (ref 26.0–34.0)
MCHC: 31.8 g/dL (ref 30.0–36.0)
MCV: 98.8 fL (ref 80.0–100.0)
Monocytes Absolute: 0.5 10*3/uL (ref 0.1–1.0)
Monocytes Relative: 6 %
Neutro Abs: 5 10*3/uL (ref 1.7–7.7)
Neutrophils Relative %: 68 %
Platelets: 293 10*3/uL (ref 150–400)
RBC: 4.11 MIL/uL (ref 3.87–5.11)
RDW: 13.4 % (ref 11.5–15.5)
WBC: 7.5 10*3/uL (ref 4.0–10.5)
nRBC: 0 % (ref 0.0–0.2)

## 2022-08-05 LAB — BASIC METABOLIC PANEL
Anion gap: 9 (ref 5–15)
BUN: 24 mg/dL — ABNORMAL HIGH (ref 8–23)
CO2: 30 mmol/L (ref 22–32)
Calcium: 9.5 mg/dL (ref 8.9–10.3)
Chloride: 100 mmol/L (ref 98–111)
Creatinine, Ser: 0.8 mg/dL (ref 0.44–1.00)
GFR, Estimated: >60 mL/min (ref >60)
Glucose, Bld: 100 mg/dL — ABNORMAL HIGH (ref 70–99)
Potassium: 4.2 mmol/L (ref 3.5–5.1)
Sodium: 139 mmol/L (ref 135–145)

## 2022-08-05 MED ORDER — CEPHALEXIN 250 MG PO CAPS
500.0000 mg | ORAL_CAPSULE | Freq: Once | ORAL | Status: AC
  Administered 2022-08-05: 500 mg via ORAL
  Filled 2022-08-05: qty 2

## 2022-08-05 MED ORDER — CEPHALEXIN 500 MG PO CAPS: 500.0000 mg | ORAL_CAPSULE | Freq: Three times a day (TID) | ORAL | 0 refills | Status: AC

## 2022-08-05 NOTE — Telephone Encounter (Signed)
Patient and her daughter [name] notified as instructed by telephone and verbalized understanding. Patient stated that she did not want to go to [facility] ER and wait for hours. Patient and her daughter was given information on [employer] at [facility].  [name] stated that they will get ready and head to the ER shortly.

## 2022-08-05 NOTE — ED Notes (Signed)
RN reviewed discharge instructions with pt. Pt verbalized understanding and had no further questions. VSS upon discharge.  

## 2022-08-05 NOTE — Telephone Encounter (Signed)
I think she needs in person eval asap to see about options.  She may need to be at the ER if any fever or spreading redness.

## 2022-08-05 NOTE — Telephone Encounter (Signed)
Sonya from [facility] called requesting to speak to [employer] regarding the pt's wound issue. Lamar Laundry wants to know what she should do for the pt's wound? Lamar Laundry also mentioned that the pt should probably see Para March soon for a ov. Call back # [phone]

## 2022-08-05 NOTE — Telephone Encounter (Signed)
Called and spoke with sonya at [employer]. She was still with the patient when I called. They are very concerned about patients wounds. Stated that both heels are S scar? (Not sure if I spelled what she said right or not). R heel is draining thick yellow fluid, red around the wound but no warmth. She states wounds are unstable and concerned for infection. They need to know how to treat this over the weekend. Patient has appt on 08/08/22

## 2022-08-05 NOTE — Discharge Instructions (Addendum)
You came to the emergency department today with a wound to your heel.  Please do your best to keep pressure off of this when you are lying in the bed with pillows.  You may use Neosporin or bacitracin around the wound.  Please go to the nearest emergency department with any fevers, chills, worsening drainage or any further concerns.  Continue to follow-up outpatient with your PCP and home health nurse.  Keflex has been sent to your pharmacy.

## 2022-08-05 NOTE — ED Triage Notes (Addendum)
Arrives in wheelchair to triage. With Family.  Sent by doctor for wound check on right heel and lateral aspect of same foot. Began with cast from fx on same leg. Black eschar noted. No excessive warmth noted. Pulses intact. Reports not diabetic.

## 2022-08-05 NOTE — ED Provider Notes (Signed)
[place] EMERGENCY DEPARTMENT AT [facility] Provider Note   CSN: [id number] Arrival date & time: 08/05/22  1450     History  Chief Complaint  Patient presents with   Wound Check    [name] is a 89 y.o. female presenting for check of a wound on her right heel.  Patient reports that she broke her leg in March and subsequently has been immobile.  She went and saw her PCP today and they wanted her to come and have her wound evaluated due to drainage and erythema. Patient denies any fevers or chills.   Wound Check       Home Medications Prior to Admission medications   Medication Sig Start Date End Date Taking? Authorizing Provider  acetaminophen (TYLENOL) 500 MG tablet Take 2 tablets (1,000 mg total) by mouth every 8 (eight) hours as needed. 07/07/22   [name], MD  apixaban (ELIQUIS) 2.5 MG TABS tablet TAKE 1 TABLET BY MOUTH TWICE A DAY 04/28/22   Croitoru, Mihai, MD  calcium carbonate (TUMS - DOSED IN MG ELEMENTAL CALCIUM) 500 MG chewable tablet Chew 500 mg by mouth 2 (two) times daily as needed for indigestion or heartburn.    [provider]  cholecalciferol (VITAMIN D3) 25 MCG (1000 UNIT) tablet Take 1,000 Units by mouth in the morning.    [provider]  cholestyramine (QUESTRAN) 4 g packet TAKE 1 PACKET (4 G TOTAL) BY MOUTH DAILY WITH SUPPER. 04/12/22   Iva Boop, MD  Cranberry 180 MG CAPS Take 180 mg by mouth daily in the afternoon.    [provider]  digoxin (LANOXIN) 0.125 MG tablet 1/4 tab by mouth daily. 06/17/22   [name], MD  fluticasone [facility]) 50 MCG/ACT nasal spray SPRAY 2 SPRAYS INTO EACH NOSTRIL EVERY DAY 07/18/22   [name], MD  KLOR-CON M20 20 MEQ tablet TAKE 2 TABLETS BY MOUTH DAILY 12/20/21   [name], MD  loperamide (IMODIUM) 2 MG capsule Take 2 mg by mouth 3 (three) times daily as needed for diarrhea or loose stools.     [provider]  loratadine (CLARITIN) 10 MG  tablet Take 10 mg by mouth daily.    [provider]  metoprolol succinate (TOPROL-XL) 100 MG 24 hr tablet Take 1.5 tablets (150 mg total) by mouth daily. 04/22/22   [name], MD  pantoprazole (PROTONIX) 20 MG tablet Take 1 tablet (20 mg total) by mouth daily. 06/22/22   [name], MD  torsemide (DEMADEX) 10 MG tablet Take 1 tablet (10 mg total) by mouth daily. Dry weight is 149. If you are above this, take 20mg  torsemide. 05/20/22   Croitoru, Mihai, MD  traMADol (ULTRAM) 50 MG tablet Take 1 tablet (50 mg total) by mouth 3 (three) times daily as needed for moderate pain or severe pain. 07/06/22   [name], MD  venlafaxine XR (EFFEXOR-XR) 37.5 MG 24 hr capsule TAKE 2 CAPSULES (75 MG TOTAL) BY MOUTH EVERY MORNING. 08/01/22   [name], MD      Allergies    Ace inhibitors, Warfarin sodium, Famotidine, Vancomycin, Codeine, Delsym [dextromethorphan polistirex er], Lactose intolerance (gi), Lasix [furosemide], Phenylephrine, and Sulfamethoxazole-trimethoprim    Review of Systems   Review of Systems  Physical Exam Updated Vital Signs BP 122/68   Pulse 79   Temp 98.3 F (36.8 C) (Oral)   Resp 17   Ht 5\' 5"  (1.651 m)   Wt 68 kg  SpO2 96%   BMI 24.96 kg/m  Physical Exam Vitals and nursing note reviewed.  Constitutional:      Appearance: Normal appearance.  HENT:     Head: Normocephalic and atraumatic.  Eyes:     General: No scleral icterus.    Conjunctiva/sclera: Conjunctivae normal.  Pulmonary:     Effort: Pulmonary effort is normal. No respiratory distress.  Skin:    Findings: No rash.     Comments: Warm limb, 1+ DP pulse  Neurological:     Mental Status: She is alert.  Psychiatric:        Mood and Affect: Mood normal.         ED Results / Procedures / Treatments   Labs (all labs ordered are listed, but only abnormal results are displayed) Labs Reviewed - No data to display  EKG None  Radiology No results  found.  Procedures Procedures   Medications Ordered in ED Medications - No data to display  ED Course/ Medical Decision Making/ A&P                             Medical Decision Making Amount and/or Complexity of Data Reviewed Labs: ordered. Radiology: ordered.  Risk Prescription drug management.   Per chart review patient has an outpatient home health nurse.  This is who was concerned about patient's wounds and subsequently contacted PCP.  The wounds were "unstageable, black, draining".  While not the best imaging modality patient's x-ray negative for osteomyelitis.  She is afebrile and not tachycardic.  Has a normal WBC count.  Do not believe her wound requires any further evaluation in the emergency department.  She will be discharged home with continued home health medicine.   Final Clinical Impression(s) / ED Diagnoses Final diagnoses:  Open wound of right heel, initial encounter    Rx / DC Orders ED Discharge Orders          Ordered    cephALEXin (KEFLEX) 500 MG capsule  3 times daily        08/05/22 1657           Results and diagnoses were explained to the patient. Return precautions discussed in full. Patient had no additional questions and expressed complete understanding.   This chart was dictated using voice recognition software.  Despite best efforts to proofread,  errors can occur which can change the documentation meaning.     Woodroe Chen 08/05/22 1714    Maia Plan, MD 08/10/22 [phone]

## 2022-08-05 NOTE — Telephone Encounter (Signed)
Agree. Thanks

## 2022-08-07 ENCOUNTER — Encounter: Payer: Self-pay | Admitting: Family Medicine

## 2022-08-08 ENCOUNTER — Encounter: Payer: Self-pay | Admitting: Family Medicine

## 2022-08-08 ENCOUNTER — Ambulatory Visit (INDEPENDENT_AMBULATORY_CARE_PROVIDER_SITE_OTHER): Payer: Medicare Other | Admitting: Family Medicine

## 2022-08-08 VITALS — BP 104/80 | HR 61 | Temp 97.6°F

## 2022-08-08 DIAGNOSIS — L89619 Pressure ulcer of right heel, unspecified stage: Secondary | ICD-10-CM

## 2022-08-08 DIAGNOSIS — L8990 Pressure ulcer of unspecified site, unspecified stage: Secondary | ICD-10-CM

## 2022-08-08 MED ORDER — TORSEMIDE 10 MG PO TABS: 10.0000 mg | ORAL_TABLET | Freq: Every day | ORAL | 3 refills | Status: DC

## 2022-08-08 NOTE — Patient Instructions (Addendum)
I would keep taking the antibiotics for now and update me in about 3 days.  We may need to extend the antibiotics.   Keep the wounds clean and covered.    I would put a pillow under the fitted sheet to try to raise your ankles at night.  You can try using heel covers at night.   Take care.  Glad to see you.

## 2022-08-08 NOTE — Progress Notes (Unsigned)
On keflex after ER eval. Using bacitracin ointment.  Mood d/w pt.  She is frustrated by her limitations.  Sleeping better.  Limited mobility.  And she is closing the sale of her house tomorrow.  She has a lot going on.  She has PT ongoing.  "I've got to learn to walk again."  No pain but R foot still feels tight. No SI.  Has been on torsemide daily in the meantime.  She hasn't been able to weigh at home.    Meds, vitals, and allergies reviewed.   ROS: Per HPI unless specifically indicated in ROS section   Nad Ncat In wheelchair at baseline.  Not tachy Ctab 3x2 cm R lateral heel and 3x1 R distal 5th MT wounds dressed, inspected and rebandaged.  No drainage.  No spreading erythema.   Heel eschar has gotten smaller per patient and husband's report.  Normal capillary refill in the foot.  30 minutes were devoted to patient care in this encounter (this includes time spent reviewing the patient's file/history, interviewing and examining the patient, counseling/reviewing plan with patient).

## 2022-08-10 DIAGNOSIS — Z993 Dependence on wheelchair: Secondary | ICD-10-CM | POA: Diagnosis not present

## 2022-08-10 DIAGNOSIS — Z9181 History of falling: Secondary | ICD-10-CM | POA: Diagnosis not present

## 2022-08-10 DIAGNOSIS — S82401D Unspecified fracture of shaft of right fibula, subsequent encounter for closed fracture with routine healing: Secondary | ICD-10-CM | POA: Diagnosis not present

## 2022-08-10 DIAGNOSIS — Z556 Problems related to health literacy: Secondary | ICD-10-CM | POA: Diagnosis not present

## 2022-08-10 DIAGNOSIS — M1990 Unspecified osteoarthritis, unspecified site: Secondary | ICD-10-CM | POA: Diagnosis not present

## 2022-08-10 DIAGNOSIS — L8990 Pressure ulcer of unspecified site, unspecified stage: Secondary | ICD-10-CM | POA: Insufficient documentation

## 2022-08-10 DIAGNOSIS — Z7951 Long term (current) use of inhaled steroids: Secondary | ICD-10-CM | POA: Diagnosis not present

## 2022-08-10 DIAGNOSIS — S82201D Unspecified fracture of shaft of right tibia, subsequent encounter for closed fracture with routine healing: Secondary | ICD-10-CM | POA: Diagnosis not present

## 2022-08-10 DIAGNOSIS — I4821 Permanent atrial fibrillation: Secondary | ICD-10-CM | POA: Diagnosis not present

## 2022-08-10 DIAGNOSIS — W19XXXD Unspecified fall, subsequent encounter: Secondary | ICD-10-CM | POA: Diagnosis not present

## 2022-08-10 DIAGNOSIS — I509 Heart failure, unspecified: Secondary | ICD-10-CM | POA: Diagnosis not present

## 2022-08-10 DIAGNOSIS — L89620 Pressure ulcer of left heel, unstageable: Secondary | ICD-10-CM | POA: Diagnosis not present

## 2022-08-10 DIAGNOSIS — D649 Anemia, unspecified: Secondary | ICD-10-CM | POA: Diagnosis not present

## 2022-08-10 DIAGNOSIS — Z7901 Long term (current) use of anticoagulants: Secondary | ICD-10-CM | POA: Diagnosis not present

## 2022-08-10 DIAGNOSIS — L98499 Non-pressure chronic ulcer of skin of other sites with unspecified severity: Secondary | ICD-10-CM | POA: Insufficient documentation

## 2022-08-10 NOTE — Assessment & Plan Note (Signed)
She is frustrated by her functional status, which is understandable.  She is still okay for outpatient follow-up in terms of her mood.  She will update me as needed.  Would continue torsemide as is for swelling.  Discussed offloading and "floating" her heels when she is in bed. I would keep taking the Keflex for now and update me in about 3 days.  We may need to extend the Keflex.   Keep the wounds clean and covered.    I would put a pillow under the fitted sheet to try to raise her ankles at night.  She can try using heel covers at night.   Rationale discussed with patient.  Update me as needed.  Continue with home wound care.

## 2022-08-11 ENCOUNTER — Telehealth: Payer: Self-pay

## 2022-08-11 ENCOUNTER — Telehealth: Payer: Self-pay | Admitting: Family Medicine

## 2022-08-11 ENCOUNTER — Ambulatory Visit (INDEPENDENT_AMBULATORY_CARE_PROVIDER_SITE_OTHER): Payer: Medicare Other

## 2022-08-11 VITALS — Ht 65.0 in | Wt 150.0 lb

## 2022-08-11 DIAGNOSIS — M1990 Unspecified osteoarthritis, unspecified site: Secondary | ICD-10-CM | POA: Diagnosis not present

## 2022-08-11 DIAGNOSIS — Z7901 Long term (current) use of anticoagulants: Secondary | ICD-10-CM | POA: Diagnosis not present

## 2022-08-11 DIAGNOSIS — I4821 Permanent atrial fibrillation: Secondary | ICD-10-CM | POA: Diagnosis not present

## 2022-08-11 DIAGNOSIS — D649 Anemia, unspecified: Secondary | ICD-10-CM | POA: Diagnosis not present

## 2022-08-11 DIAGNOSIS — Z9181 History of falling: Secondary | ICD-10-CM | POA: Diagnosis not present

## 2022-08-11 DIAGNOSIS — Z0000 Encounter for general adult medical examination without abnormal findings: Secondary | ICD-10-CM | POA: Diagnosis not present

## 2022-08-11 DIAGNOSIS — S82201D Unspecified fracture of shaft of right tibia, subsequent encounter for closed fracture with routine healing: Secondary | ICD-10-CM | POA: Diagnosis not present

## 2022-08-11 DIAGNOSIS — W19XXXD Unspecified fall, subsequent encounter: Secondary | ICD-10-CM | POA: Diagnosis not present

## 2022-08-11 DIAGNOSIS — L89620 Pressure ulcer of left heel, unstageable: Secondary | ICD-10-CM | POA: Diagnosis not present

## 2022-08-11 DIAGNOSIS — S82401D Unspecified fracture of shaft of right fibula, subsequent encounter for closed fracture with routine healing: Secondary | ICD-10-CM | POA: Diagnosis not present

## 2022-08-11 DIAGNOSIS — Z7951 Long term (current) use of inhaled steroids: Secondary | ICD-10-CM | POA: Diagnosis not present

## 2022-08-11 DIAGNOSIS — Z556 Problems related to health literacy: Secondary | ICD-10-CM | POA: Diagnosis not present

## 2022-08-11 DIAGNOSIS — Z993 Dependence on wheelchair: Secondary | ICD-10-CM | POA: Diagnosis not present

## 2022-08-11 DIAGNOSIS — I509 Heart failure, unspecified: Secondary | ICD-10-CM | POA: Diagnosis not present

## 2022-08-11 NOTE — Patient Instructions (Signed)
[name] , Thank you for taking time to come for your Medicare Wellness Visit. I appreciate your ongoing commitment to your health goals. Please review the following plan we discussed and let me know if I can assist you in the future.   These are the goals we discussed:  Goals      Exercise 150 min/wk Moderate Activity     Try to increase movement as tolerated.      Increase water intake     Starting 01/30/2017, I will attempt to drink at least one glass of water with each meal.      Manage My Medicine     Timeframe:  Long-Range Goal Priority:  High Start Date:          03/24/21                   Expected End Date:        03/24/22               Follow Up Date Jan 2023   - call for medicine refill 2 or 3 days before it runs out - call if I am sick and can't take my medicine - keep a list of all the medicines I take; vitamins and herbals too - use a pillbox to sort medicine  -Try famotidine (Pepcid) 20 mg with dinner for reflux -Contact GI about worsening diarrhea -Make PCP appt for January 2023 - updated labwork needed   Why is this important?   These steps will help you keep on track with your medicines.   Notes:         This is a list of the screening recommended for you and due dates:  Health Maintenance  Topic Date Due   DTaP/Tdap/Td vaccine (2 - Tdap) 05/13/2020   DEXA scan (bone density measurement)  12/29/2025*   Flu Shot  11/10/2022   Medicare Annual Wellness Visit  08/11/2023   Pneumonia Vaccine  Completed   Zoster (Shingles) Vaccine  Completed   HPV Vaccine  Aged Out   COVID-19 Vaccine  Discontinued  *Topic was postponed. The date shown is not the original due date.    Advanced directives: Please bring a copy of your health care power of attorney and living will to the office to be added to your chart at your convenience.   Conditions/risks identified: Aim for 30 minutes of exercise or brisk walking, 6-8 glasses of water, and 5 servings of fruits and  vegetables each day.   Next appointment: Follow up in one year for your annual wellness visit 08/15/23 @ 11:30 televisit   Preventive Care 65 Years and Older, Female Preventive care refers to lifestyle choices and visits with your health care provider that can promote health and wellness. What does preventive care include? A yearly physical exam. This is also called an annual well check. Dental exams once or twice a year. Routine eye exams. Ask your health care provider how often you should have your eyes checked. Personal lifestyle choices, including: Daily care of your teeth and gums. Regular physical activity. Eating a healthy diet. Avoiding tobacco and drug use. Limiting alcohol use. Practicing safe sex. Taking low-dose aspirin every day. Taking vitamin and mineral supplements as recommended by your health care provider. What happens during an annual well check? The services and screenings done by your health care provider during your annual well check will depend on your age, overall health, lifestyle risk factors, and family history of disease. Counseling  Your health care provider may ask you questions about your: Alcohol use. Tobacco use. Drug use. Emotional well-being. Home and relationship well-being. Sexual activity. Eating habits. History of falls. Memory and ability to understand (cognition). Work and work Statistician. Reproductive health. Screening  You may have the following tests or measurements: Height, weight, and BMI. Blood pressure. Lipid and cholesterol levels. These may be checked every 5 years, or more frequently if you are over 33 years old. Skin check. Lung cancer screening. You may have this screening every year starting at age 53 if you have a 30-pack-year history of smoking and currently smoke or have quit within the past 15 years. Fecal occult blood test (FOBT) of the stool. You may have this test every year starting at age 59. Flexible  sigmoidoscopy or colonoscopy. You may have a sigmoidoscopy every 5 years or a colonoscopy every 10 years starting at age 64. Hepatitis C blood test. Hepatitis B blood test. Sexually transmitted disease (STD) testing. Diabetes screening. This is done by checking your blood sugar (glucose) after you have not eaten for a while (fasting). You may have this done every 1-3 years. Bone density scan. This is done to screen for osteoporosis. You may have this done starting at age 81. Mammogram. This may be done every 1-2 years. Talk to your health care provider about how often you should have regular mammograms. Talk with your health care provider about your test results, treatment options, and if necessary, the need for more tests. Vaccines  Your health care provider may recommend certain vaccines, such as: Influenza vaccine. This is recommended every year. Tetanus, diphtheria, and acellular pertussis (Tdap, Td) vaccine. You may need a Td booster every 10 years. Zoster vaccine. You may need this after age 35. Pneumococcal 13-valent conjugate (PCV13) vaccine. One dose is recommended after age 10. Pneumococcal polysaccharide (PPSV23) vaccine. One dose is recommended after age 51. Talk to your health care provider about which screenings and vaccines you need and how often you need them. This information is not intended to replace advice given to you by your health care provider. Make sure you discuss any questions you have with your health care provider. Document Released: 04/24/2015 Document Revised: 12/16/2015 Document Reviewed: 01/27/2015 Elsevier Interactive Patient Education  2017 [place] Prevention in the Home Falls can cause injuries. They can happen to people of all ages. There are many things you can do to make your home safe and to help prevent falls. What can I do on the outside of my home? Regularly fix the edges of walkways and driveways and fix any cracks. Remove anything that  might make you trip as you walk through a door, such as a raised step or threshold. Trim any bushes or trees on the path to your home. Use bright outdoor lighting. Clear any walking paths of anything that might make someone trip, such as rocks or tools. Regularly check to see if handrails are loose or broken. Make sure that both sides of any steps have handrails. Any raised decks and porches should have guardrails on the edges. Have any leaves, snow, or ice cleared regularly. Use sand or salt on walking paths during winter. Clean up any spills in your garage right away. This includes oil or grease spills. What can I do in the bathroom? Use night lights. Install grab bars by the toilet and in the tub and shower. Do not use towel bars as grab bars. Use non-skid mats or decals in the tub  or shower. If you need to sit down in the shower, use a plastic, non-slip stool. Keep the floor dry. Clean up any water that spills on the floor as soon as it happens. Remove soap buildup in the tub or shower regularly. Attach bath mats securely with double-sided non-slip rug tape. Do not have throw rugs and other things on the floor that can make you trip. What can I do in the bedroom? Use night lights. Make sure that you have a light by your bed that is easy to reach. Do not use any sheets or blankets that are too big for your bed. They should not hang down onto the floor. Have a firm chair that has side arms. You can use this for support while you get dressed. Do not have throw rugs and other things on the floor that can make you trip. What can I do in the kitchen? Clean up any spills right away. Avoid walking on wet floors. Keep items that you use a lot in easy-to-reach places. If you need to reach something above you, use a strong step stool that has a grab bar. Keep electrical cords out of the way. Do not use floor polish or wax that makes floors slippery. If you must use wax, use non-skid floor  wax. Do not have throw rugs and other things on the floor that can make you trip. What can I do with my stairs? Do not leave any items on the stairs. Make sure that there are handrails on both sides of the stairs and use them. Fix handrails that are broken or loose. Make sure that handrails are as long as the stairways. Check any carpeting to make sure that it is firmly attached to the stairs. Fix any carpet that is loose or worn. Avoid having throw rugs at the top or bottom of the stairs. If you do have throw rugs, attach them to the floor with carpet tape. Make sure that you have a light switch at the top of the stairs and the bottom of the stairs. If you do not have them, ask someone to add them for you. What else can I do to help prevent falls? Wear shoes that: Do not have high heels. Have rubber bottoms. Are comfortable and fit you well. Are closed at the toe. Do not wear sandals. If you use a stepladder: Make sure that it is fully opened. Do not climb a closed stepladder. Make sure that both sides of the stepladder are locked into place. Ask someone to hold it for you, if possible. Clearly mark and make sure that you can see: Any grab bars or handrails. First and last steps. Where the edge of each step is. Use tools that help you move around (mobility aids) if they are needed. These include: Canes. Walkers. Scooters. Crutches. Turn on the lights when you go into a dark area. Replace any light bulbs as soon as they burn out. Set up your furniture so you have a clear path. Avoid moving your furniture around. If any of your floors are uneven, fix them. If there are any pets around you, be aware of where they are. Review your medicines with your doctor. Some medicines can make you feel dizzy. This can increase your chance of falling. Ask your doctor what other things that you can do to help prevent falls. This information is not intended to replace advice given to you by your  health care provider. Make sure you discuss any questions  you have with your health care provider. Document Released: 01/22/2009 Document Revised: 09/03/2015 Document Reviewed: 05/02/2014 Elsevier Interactive Patient Education  2017 [employer].

## 2022-08-11 NOTE — Telephone Encounter (Signed)
Home Health verbal orders Caller Name:Stephaine Agency Name:[facility]   Callback number:[phone]   Requesting OT/PT/Skilled nursing/Social Work/Speech:  Reason: OT  Frequency:1 w- 2 Visit  ,  1w - 4 visit  Please forward to [facility] pool or providers CMA

## 2022-08-11 NOTE — Progress Notes (Signed)
I connected with  [name] on 08/11/22 by a audio enabled telemedicine application and verified that I am speaking with the correct person using two identifiers.  Patient Location: Home  Provider Location: Home Office  I discussed the limitations of evaluation and management by telemedicine. The patient expressed understanding and agreed to proceed.  Subjective:   [name] is a 89 y.o. female who presents for Medicare Annual (Subsequent) preventive examination.  Review of Systems      Cardiac Risk Factors include: advanced age (>29men, >9 women);sedentary lifestyle     Objective:    Today's Vitals   08/11/22 1505  Weight: 150 lb (68 kg)  Height: 5\' 5"  (1.651 m)   Body mass index is 24.96 kg/m.     08/11/2022    3:20 PM 08/05/2022    3:13 PM 07/01/2022   10:04 AM 06/28/2022    9:59 AM 07/12/2021   12:10 PM 09/22/2020   11:05 AM 09/21/2020    2:56 PM  Advanced Directives  Does Patient Have a Medical Advance Directive? Yes No No Yes Yes Yes Yes  Type of Estate agent of [place];Living will   Healthcare Power of [place];Living will Healthcare Power of [place];Living will Healthcare Power of [place];Living will Healthcare Power of [place];Living will  Does patient want to make changes to medical advance directive? No - Patient declined  No - Patient declined No - Patient declined  No - Patient declined   Copy of Healthcare Power of Attorney in Chart? Yes - validated most recent copy scanned in chart (See row information)   Yes - validated most recent copy scanned in chart (See row information) Yes - validated most recent copy scanned in chart (See row information) No - copy requested   Would patient like information on creating a medical advance directive?   No - Patient declined  No - Patient declined      Current Medications (verified) Outpatient Encounter Medications as of 08/11/2022  Medication Sig   acetaminophen (TYLENOL) 500 MG tablet  Take 2 tablets (1,000 mg total) by mouth every 8 (eight) hours as needed.   apixaban (ELIQUIS) 2.5 MG TABS tablet TAKE 1 TABLET BY MOUTH TWICE A DAY   calcium carbonate (TUMS - DOSED IN MG ELEMENTAL CALCIUM) 500 MG chewable tablet Chew 500 mg by mouth 2 (two) times daily as needed for indigestion or heartburn.   cholecalciferol (VITAMIN D3) 25 MCG (1000 UNIT) tablet Take 1,000 Units by mouth in the morning.   digoxin (LANOXIN) 0.125 MG tablet 1/4 tab by mouth daily.   fluticasone (FLONASE) 50 MCG/ACT nasal spray SPRAY 2 SPRAYS INTO EACH NOSTRIL EVERY DAY   KLOR-CON M20 20 MEQ tablet TAKE 2 TABLETS BY MOUTH DAILY   loperamide (IMODIUM) 2 MG capsule Take 2 mg by mouth 3 (three) times daily as needed for diarrhea or loose stools.    loratadine (CLARITIN) 10 MG tablet Take 10 mg by mouth daily.   metoprolol succinate (TOPROL-XL) 100 MG 24 hr tablet Take 1.5 tablets (150 mg total) by mouth daily.   pantoprazole (PROTONIX) 20 MG tablet Take 1 tablet (20 mg total) by mouth daily.   Povidone (IVIZIA DRY EYES OP) Apply to eye.   torsemide (DEMADEX) 10 MG tablet Take 1 tablet (10 mg total) by mouth daily.   venlafaxine XR (EFFEXOR-XR) 37.5 MG 24 hr capsule TAKE 2 CAPSULES (75 MG TOTAL) BY MOUTH EVERY MORNING.   cholestyramine (QUESTRAN) 4 g packet TAKE 1 PACKET (4 G  TOTAL) BY MOUTH DAILY WITH SUPPER. (Patient not taking: Reported on 08/11/2022)   Cranberry 180 MG CAPS Take 180 mg by mouth daily in the afternoon. (Patient not taking: Reported on 08/11/2022)   traMADol (ULTRAM) 50 MG tablet Take 1 tablet (50 mg total) by mouth 3 (three) times daily as needed for moderate pain or severe pain. (Patient not taking: Reported on 08/11/2022)   No facility-administered encounter medications on file as of 08/11/2022.    Allergies (verified) Ace inhibitors, Warfarin sodium, Famotidine, Vancomycin, Codeine, Delsym [dextromethorphan polistirex er], Lactose intolerance (gi), Lasix [furosemide], Phenylephrine, and  Sulfamethoxazole-trimethoprim   History: Past Medical History:  Diagnosis Date   Acute combined systolic and diastolic heart failure (HCC) 05/02/2017   Anemia    Anxiety    Arthritis    "knees" (06/18/2015)   Atrial fibrillation with RVR (HCC) 06/18/2015   h/o sig bleed on coumadin   Cardiogenic shock 05/02/2017   Complication of anesthesia 2010   "w/cardiac cath; got into a psychotic state for ~ 3 days; got me out w/Valium"   Depression    "I have it off and on; not as often as I've gotten older" (06/18/2015)   Diverticulosis    descending and sigmoid colon--Dr. Jarold Motto   DJD (degenerative joint disease) of knee    left   Dyspnea    with exertion and in morning mostly when wakes up    E coli infection    History of   GERD (gastroesophageal reflux disease)    History of blood transfusion    History of Clostridium difficile colitis    IBS (irritable bowel syndrome)    Insomnia     off chronic ambien 5mg  as of 10/11, rare/episodic use since then  DCM/CHF   LBBB (left bundle branch block)    Lymphocytic colitis 09/27/2016   Moderate to severe pulmonary hypertension (HCC) 11/26/2019   Nonischemic cardiomyopathy (HCC)    normal coronaries, EF 15-20% 04/2008, EF 50-55% 2015   Osteoporosis    on DXA 05/2010   Positive PPD    Retroperitoneal bleed    Sciatica    Right   Urinary incontinence    Occassionally   Past Surgical History:  Procedure Laterality Date   ANTERIOR APPROACH HEMI HIP ARTHROPLASTY Left 08/05/2019   Procedure: ANTERIOR APPROACH HEMI HIP ARTHROPLASTY;  Surgeon: Jodi Geralds, MD;  [facility];  Service: Orthopedics;  Laterality: Left;   BIOPSY  11/22/2017   Procedure: BIOPSY;  Surgeon: Iva Boop, MD;  Location: [facility] END[facility]COPY;  Service: Endoscopy;;   CARDIAC CATHETERIZATION  2010   CARDIOVERSION N/A 06/19/2015   Procedure: CARDIOVERSION;  Surgeon: Laurey Morale, MD;  [facility];  Service: Cardiovascular;  Laterality: N/A;   CATARACT  EXTRACTION W/ INTRAOCULAR LENS  IMPLANT, BILATERAL Bilateral    COLONOSCOPY WITH PROPOFOL N/A 11/22/2017   Procedure: COLONOSCOPY WITH PROPOFOL;  Surgeon: Iva Boop, MD;  [facility]: [facility] END[facility];  Service: Endoscopy;  Laterality: N/A;   DILATION AND CURETTAGE OF UTERUS     ORIF ANKLE FRACTURE Right 09/22/2020   Procedure: RIGHT ANKLE OPEN REDUCTION INTERNAL FIXATION (ORIF);  Surgeon: [name], MD;  [facility]: [facility] ORS;  Service: Orthopedics;  Laterality: Right;   ORIF TIBIA PLATEAU Right 06/28/2022   Procedure: OPEN REDUCTION INTERNAL FIXATION (ORIF) TIBIA;  Surgeon: Roby Lofts, MD;  Location: [facility] OR;  Service: Orthopedics;  Laterality: Right;   TEE WITHOUT CARDIOVERSION N/A 06/19/2015   Procedure: TRANSESOPHAGEAL ECHOCARDIOGRAM (TEE);  Surgeon: Laurey Morale, MD;  Location: [facility] ENDOSCOPY;  Service: Cardiovascular;  Laterality: N/A;   TOTAL KNEE ARTHROPLASTY Left 04/25/2017   Procedure: TOTAL KNEE ARTHROPLASTY;  Surgeon: [name], MD;  Location: [facility];  Service: Orthopedics;  Laterality: Left;   TOTAL KNEE ARTHROPLASTY Right 02/18/2020   Procedure: RIGHT TOTAL KNEE ARTHROPLASTY;  Surgeon: [name], MD;  [facility];  Service: Orthopedics;  Laterality: Right;   TOTAL KNEE ARTHROPLASTY Right 06/16/2020   Procedure: RIGHT RETINACULAR REPAIR;  Surgeon: [name], MD;  [facility];  Service: Orthopedics;  Laterality: Right;   TOTAL KNEE REVISION Left 12/03/2019   Procedure: LEFT TOTAL KNEE REVISION  OF POYLY EXCHANGE;  Surgeon: [name], MD;  [facility];  Service: Orthopedics;  Laterality: Left;   VAGINAL HYSTERECTOMY  1979   ovaries intact   Family History  Problem Relation Age of Onset   Colon cancer Father        possible colon cancer   Other Father        esophagus tumor?   Tuberculosis Mother    Breast cancer Neg Hx    Stomach cancer Neg Hx    Pancreatic cancer Neg Hx    Social History   Socioeconomic History   Marital status: Married     Spouse name: Dorene Sorrow    Number of children: 2   Years of education: 13   Highest education level: Not on file  Occupational History   Occupation: Retired    Associate Professor: RETIRED  Tobacco Use   Smoking status: Never   Smokeless tobacco: Never  Vaping Use   Vaping Use: Never used  Substance and Sexual Activity   Alcohol use: No    Alcohol/week: 0.0 standard drinks of alcohol   Drug use: No   Sexual activity: Not Currently    Birth control/protection: Post-menopausal, Surgical    Comment: Hysterectomy  Other Topics Concern   Not on file  Social History Narrative   Retired, former [employer]   Married, 1953   Lives with husband   2 grown children, one in [place], one out of state   Tobacco Use - No.    Drug Use - no   teaches Sunday school, reading   Social Determinants of Health   Financial Resource Strain: Low Risk  (07/12/2021)   Overall Financial Resource Strain (CARDIA)    Difficulty of Paying Living Expenses: Not hard at all  Food Insecurity: No Food Insecurity (08/11/2022)   Hunger Vital Sign    Worried About Running Out of Food in the Last Year: Never true    Ran Out of Food in the Last Year: Never true  Transportation Needs: No Transportation Needs (08/11/2022)   PRAPARE - Administrator, Civil Service (Medical): No    Lack of Transportation (Non-Medical): No  Physical Activity: Sufficiently Active (08/11/2022)   Exercise Vital Sign    Days of Exercise per Week: 7 days    Minutes of Exercise per Session: 30 min  Stress: No Stress Concern Present (08/11/2022)   [employer] of Occupational Health - Occupational Stress Questionnaire    Feeling of Stress : Not at all  Social Connections: Moderately Isolated (08/11/2022)   Social Connection and Isolation Panel [NHANES]    Frequency of Communication with Friends and Family: More than three times a week    Frequency of Social Gatherings with Friends and Family: More than three times a week    Attends  Religious Services: Never    Active Member of  Clubs or Organizations: No    Attends Banker Meetings: Never    Marital Status: Married    Tobacco Counseling Counseling given: Not Answered   Clinical Intake:  Pre-visit preparation completed: Yes  Pain : No/denies pain     Nutritional Risks: Non-healing wound (bedsore on right foot) Diabetes: No  How often do you need to have someone help you when you read instructions, pamphlets, or other written materials from your doctor or pharmacy?: 1 - Never  Diabetic? no  Interpreter Needed?: No  Information entered by :: C.Hermine Feria LPN   Activities of Daily Living    08/11/2022    3:22 PM 07/01/2022   11:44 AM  In your present state of health, do you have any difficulty performing the following activities:  Hearing? 0   Vision? 0   Difficulty concentrating or making decisions? 1   Comment occasionally   Walking or climbing stairs? 1   Comment broken leg   Dressing or bathing? 1   Comment husband assists   Doing errands, shopping? 1 1  Comment family assists   Preparing Food and eating ? Y   Comment family assists   Using the Toilet? Y   Comment husband assists   In the past six months, have you accidently leaked urine? Y   Comment takes diuretic   Do you have problems with loss of bowel control? N   Managing your Medications? Y   Comment husband assists   Managing your Finances? Y   Housekeeping or managing your Housekeeping? Y     Patient Care Team: [name], MD as PCP - General Croitoru, Rachelle Hora, MD as PCP - Cardiology (Cardiology) Mckinley Jewel, MD as Consulting Physician (Ophthalmology) Breck Coons, DDS as Referring Physician (Dentistry) [name], MD as Consulting Physician (Orthopedic Surgery) Kathyrn Sheriff, [facility] as Pharmacist (Pharmacist)  Indicate any recent Medical Services you may have received from other than [facility] providers in the past year (date may be  approximate).     Assessment:   This is a routine wellness examination for [place].  Hearing/Vision screen Hearing Screening - Comments:: No aids Vision Screening - Comments:: Glasses - [facility]  Dietary issues and exercise activities discussed: Current Exercise Habits: Home exercise routine (physical therapy), Type of exercise: strength training/weights, Time (Minutes): 30, Frequency (Times/Week): 7, Weekly Exercise (Minutes/Week): 210, Intensity: Mild, Exercise limited by: orthopedic condition(s) (Tibial plateau fracture)   Goals Addressed   None    Depression Screen    08/11/2022    3:18 PM 08/08/2022   11:02 AM 07/07/2022    3:01 PM 06/17/2022   12:44 PM 07/12/2021   12:01 PM 06/18/2021   11:53 AM 01/11/2019    2:30 PM  PHQ 2/9 Scores  PHQ - 2 Score 0 5 4 3 2 6 6   PHQ- 9 Score 0 20 21 11 3 20      Fall Risk    08/11/2022    3:21 PM 08/08/2022   11:02 AM 07/07/2022    3:00 PM 06/17/2022   12:36 PM 07/12/2021   12:11 PM  Fall Risk   Falls in the past year? 1 1 1 1 1   Number falls in past yr: 1 0 0 0 1  Injury with Fall? 1 1 1  0 1  Comment Tibial plateau fracture      Risk for fall due to : History of fall(s);Impaired balance/gait;Orthopedic patient Impaired balance/gait;Impaired mobility;History of fall(s) Impaired balance/gait;History of fall(s) No Fall Risks Impaired balance/gait;Impaired mobility;History  of fall(s)  Follow up Education provided;Falls prevention discussed;Falls evaluation completed Falls evaluation completed;Falls prevention discussed Falls evaluation completed;Falls prevention discussed Falls evaluation completed Falls prevention discussed    FALL RISK PREVENTION PERTAINING TO THE HOME:  Any stairs in or around the home? No  If so, are there any without handrails? No  Home free of loose throw rugs in walkways, pet beds, electrical cords, etc? Yes  Adequate lighting in your home to reduce risk of falls? Yes   ASSISTIVE DEVICES UTILIZED TO PREVENT  FALLS:  Life alert? No  Use of a cane, walker or w/c? Yes  Grab bars in the bathroom? Yes  Shower chair or bench in shower? Yes  Elevated toilet seat or a handicapped toilet? Yes    Cognitive Function:    01/30/2017    2:02 PM 12/30/2015    8:50 AM  MMSE - Mini Mental State Exam  Orientation to time 5 5  Orientation to Place 5 5  Registration 3 3  Attention/ Calculation 0 0  Recall 3 3  Language- name 2 objects 0 0  Language- repeat 1 1  Language- follow 3 step command 3 3  Language- read & follow direction 0 0  Write a sentence 0 0  Copy design 0 0  Total score 20 20        08/11/2022    3:25 PM 07/12/2021   12:15 PM  6CIT Screen  What Year? 0 points 0 points  What month? 0 points 0 points  What time? 0 points 0 points  Count back from 20 0 points 0 points  Months in reverse 0 points 2 points  Repeat phrase 0 points 2 points  Total Score 0 points 4 points    Immunizations Immunization History  Administered Date(s) Administered   Fluad Quad(high Dose 65+) 12/14/2018, 01/23/2020   Influenza Whole 12/10/2009, 01/01/2012   Influenza, High Dose Seasonal PF 01/07/2022   Influenza,inj,Quad PF,6+ Mos 12/30/2013, 01/08/2015, 12/30/2015, 12/30/2016, 01/04/2018   Influenza-Unspecified 01/04/2021   PFIZER(Purple Top)SARS-COV-2 Vaccination 05/04/2019, 05/25/2019, 01/12/2020   Pneumococcal Conjugate-13 10/23/2014   Pneumococcal Polysaccharide-23 04/11/2006   Td 05/13/2010   Zoster Recombinat (Shingrix) 01/24/2019, 04/03/2019   Zoster, Live 04/11/2006    TDAP status: Due, Education has been provided regarding the importance of this vaccine. Advised may receive this vaccine at local pharmacy or Health Dept. Aware to provide a copy of the vaccination record if obtained from local pharmacy or Health Dept. Verbalized acceptance and understanding.  Flu Vaccine status: Up to date  Pneumococcal vaccine status: Up to date  Covid-19 vaccine status: Declined, Education has been  provided regarding the importance of this vaccine but patient still declined. Advised may receive this vaccine at local pharmacy or Health Dept.or vaccine clinic. Aware to provide a copy of the vaccination record if obtained from local pharmacy or Health Dept. Verbalized acceptance and understanding.  Qualifies for Shingles Vaccine? Yes   Zostavax completed Yes   Shingrix Completed?: Yes  Screening Tests Health Maintenance  Topic Date Due   DTaP/Tdap/Td (2 - Tdap) 05/13/2020   DEXA SCAN  12/29/2025 (Originally 02/12/1998)   INFLUENZA VACCINE  11/10/2022   Medicare Annual Wellness (AWV)  08/11/2023   Pneumonia Vaccine 25+ Years old  Completed   Zoster Vaccines- Shingrix  Completed   HPV VACCINES  Aged Out   COVID-19 Vaccine  Discontinued    Health Maintenance  Health Maintenance Due  Topic Date Due   DTaP/Tdap/Td (2 - Tdap) 05/13/2020    Colorectal  cancer screening: No longer required.   Mammogram status: No longer required due to age.  Bone Density - Declined  Lung Cancer Screening: (Low Dose CT Chest recommended if Age 12-80 years, 30 pack-year currently smoking OR have quit w/in 15years.) does not qualify.   Lung Cancer Screening Referral: no  Additional Screening:  Hepatitis C Screening: does not qualify; Completed no  Vision Screening: Recommended annual ophthalmology exams for early detection of glaucoma and other disorders of the eye. Is the patient up to date with their annual eye exam?  Yes  Who is the provider or what is the name of the office in which the patient attends annual eye exams? [facility] If pt is not established with a provider, would they like to be referred to a provider to establish care? No .   Dental Screening: Recommended annual dental exams for proper oral hygiene  Community Resource Referral / Chronic Care Management: CRR required this visit?  No   CCM required this visit?  No      Plan:     I have personally reviewed  and noted the following in the patient's chart:   Medical and social history Use of alcohol, tobacco or illicit drugs  Current medications and supplements including opioid prescriptions. Patient is not currently taking opioid prescriptions. Functional ability and status Nutritional status Physical activity Advanced directives List of other physicians Hospitalizations, surgeries, and ER visits in previous 12 months Vitals Screenings to include cognitive, depression, and falls Referrals and appointments  In addition, I have reviewed and discussed with patient certain preventive protocols, quality metrics, and best practice recommendations. A written personalized care plan for preventive services as well as general preventive health recommendations were provided to patient.     Maryan Puls, LPN   04/16/1094   Nurse Notes: none

## 2022-08-11 NOTE — Telephone Encounter (Signed)
Verbal orders given to [employer] at [facility]

## 2022-08-11 NOTE — Telephone Encounter (Signed)
Please give the order.  Thanks.   

## 2022-08-11 NOTE — Telephone Encounter (Signed)
Transition Care Management Unsuccessful Follow-up Telephone Call  Date of discharge and from where:  [facility] 4/26  Attempts:  1st Attempt  Reason for unsuccessful TCM follow-up call:  Left voice message   Veyda Kaufman [facility] Guide, [place] [phone] [address], [place], [place] [address] Phone: [phone] Email: [email or web address]       

## 2022-08-12 ENCOUNTER — Telehealth: Payer: Self-pay

## 2022-08-12 DIAGNOSIS — I4821 Permanent atrial fibrillation: Secondary | ICD-10-CM | POA: Diagnosis not present

## 2022-08-12 DIAGNOSIS — Z7901 Long term (current) use of anticoagulants: Secondary | ICD-10-CM | POA: Diagnosis not present

## 2022-08-12 DIAGNOSIS — I509 Heart failure, unspecified: Secondary | ICD-10-CM | POA: Diagnosis not present

## 2022-08-12 DIAGNOSIS — L89620 Pressure ulcer of left heel, unstageable: Secondary | ICD-10-CM | POA: Diagnosis not present

## 2022-08-12 DIAGNOSIS — D649 Anemia, unspecified: Secondary | ICD-10-CM | POA: Diagnosis not present

## 2022-08-12 DIAGNOSIS — Z556 Problems related to health literacy: Secondary | ICD-10-CM | POA: Diagnosis not present

## 2022-08-12 DIAGNOSIS — S82201D Unspecified fracture of shaft of right tibia, subsequent encounter for closed fracture with routine healing: Secondary | ICD-10-CM | POA: Diagnosis not present

## 2022-08-12 DIAGNOSIS — Z9181 History of falling: Secondary | ICD-10-CM | POA: Diagnosis not present

## 2022-08-12 DIAGNOSIS — W19XXXD Unspecified fall, subsequent encounter: Secondary | ICD-10-CM | POA: Diagnosis not present

## 2022-08-12 DIAGNOSIS — Z993 Dependence on wheelchair: Secondary | ICD-10-CM | POA: Diagnosis not present

## 2022-08-12 DIAGNOSIS — Z7951 Long term (current) use of inhaled steroids: Secondary | ICD-10-CM | POA: Diagnosis not present

## 2022-08-12 DIAGNOSIS — M1990 Unspecified osteoarthritis, unspecified site: Secondary | ICD-10-CM | POA: Diagnosis not present

## 2022-08-12 DIAGNOSIS — S82401D Unspecified fracture of shaft of right fibula, subsequent encounter for closed fracture with routine healing: Secondary | ICD-10-CM | POA: Diagnosis not present

## 2022-08-12 NOTE — Telephone Encounter (Signed)
Transition Care Management Unsuccessful Follow-up Telephone Call  Date of discharge and from where:  4/26 [facility]  Attempts:  2nd Attempt  Reason for unsuccessful TCM follow-up call:  Left voice message   Lenard Forth [facility] Guide, [facility] [phone] [address] [place], [place], [place] [address] Phone: [phone] Email: [email or web address]

## 2022-08-14 DIAGNOSIS — W19XXXD Unspecified fall, subsequent encounter: Secondary | ICD-10-CM | POA: Diagnosis not present

## 2022-08-14 DIAGNOSIS — D649 Anemia, unspecified: Secondary | ICD-10-CM | POA: Diagnosis not present

## 2022-08-14 DIAGNOSIS — I4821 Permanent atrial fibrillation: Secondary | ICD-10-CM | POA: Diagnosis not present

## 2022-08-14 DIAGNOSIS — I509 Heart failure, unspecified: Secondary | ICD-10-CM | POA: Diagnosis not present

## 2022-08-14 DIAGNOSIS — Z556 Problems related to health literacy: Secondary | ICD-10-CM | POA: Diagnosis not present

## 2022-08-14 DIAGNOSIS — Z7951 Long term (current) use of inhaled steroids: Secondary | ICD-10-CM | POA: Diagnosis not present

## 2022-08-14 DIAGNOSIS — Z7901 Long term (current) use of anticoagulants: Secondary | ICD-10-CM | POA: Diagnosis not present

## 2022-08-14 DIAGNOSIS — S82201D Unspecified fracture of shaft of right tibia, subsequent encounter for closed fracture with routine healing: Secondary | ICD-10-CM | POA: Diagnosis not present

## 2022-08-14 DIAGNOSIS — L89620 Pressure ulcer of left heel, unstageable: Secondary | ICD-10-CM | POA: Diagnosis not present

## 2022-08-14 DIAGNOSIS — Z993 Dependence on wheelchair: Secondary | ICD-10-CM | POA: Diagnosis not present

## 2022-08-14 DIAGNOSIS — S82401D Unspecified fracture of shaft of right fibula, subsequent encounter for closed fracture with routine healing: Secondary | ICD-10-CM | POA: Diagnosis not present

## 2022-08-14 DIAGNOSIS — Z9181 History of falling: Secondary | ICD-10-CM

## 2022-08-14 DIAGNOSIS — M1990 Unspecified osteoarthritis, unspecified site: Secondary | ICD-10-CM | POA: Diagnosis not present

## 2022-08-15 DIAGNOSIS — S82201D Unspecified fracture of shaft of right tibia, subsequent encounter for closed fracture with routine healing: Secondary | ICD-10-CM | POA: Diagnosis not present

## 2022-08-15 DIAGNOSIS — W19XXXD Unspecified fall, subsequent encounter: Secondary | ICD-10-CM | POA: Diagnosis not present

## 2022-08-15 DIAGNOSIS — S82401D Unspecified fracture of shaft of right fibula, subsequent encounter for closed fracture with routine healing: Secondary | ICD-10-CM | POA: Diagnosis not present

## 2022-08-15 DIAGNOSIS — Z7951 Long term (current) use of inhaled steroids: Secondary | ICD-10-CM | POA: Diagnosis not present

## 2022-08-15 DIAGNOSIS — Z9181 History of falling: Secondary | ICD-10-CM | POA: Diagnosis not present

## 2022-08-15 DIAGNOSIS — Z993 Dependence on wheelchair: Secondary | ICD-10-CM | POA: Diagnosis not present

## 2022-08-15 DIAGNOSIS — D649 Anemia, unspecified: Secondary | ICD-10-CM | POA: Diagnosis not present

## 2022-08-15 DIAGNOSIS — I509 Heart failure, unspecified: Secondary | ICD-10-CM | POA: Diagnosis not present

## 2022-08-15 DIAGNOSIS — L89620 Pressure ulcer of left heel, unstageable: Secondary | ICD-10-CM | POA: Diagnosis not present

## 2022-08-15 DIAGNOSIS — Z556 Problems related to health literacy: Secondary | ICD-10-CM | POA: Diagnosis not present

## 2022-08-15 DIAGNOSIS — Z7901 Long term (current) use of anticoagulants: Secondary | ICD-10-CM | POA: Diagnosis not present

## 2022-08-15 DIAGNOSIS — I4821 Permanent atrial fibrillation: Secondary | ICD-10-CM | POA: Diagnosis not present

## 2022-08-15 DIAGNOSIS — M1990 Unspecified osteoarthritis, unspecified site: Secondary | ICD-10-CM | POA: Diagnosis not present

## 2022-08-16 ENCOUNTER — Telehealth: Payer: Self-pay | Admitting: Family Medicine

## 2022-08-16 ENCOUNTER — Other Ambulatory Visit: Payer: Medicare Other

## 2022-08-16 DIAGNOSIS — S82401D Unspecified fracture of shaft of right fibula, subsequent encounter for closed fracture with routine healing: Secondary | ICD-10-CM | POA: Diagnosis not present

## 2022-08-16 DIAGNOSIS — Z556 Problems related to health literacy: Secondary | ICD-10-CM | POA: Diagnosis not present

## 2022-08-16 DIAGNOSIS — Z9181 History of falling: Secondary | ICD-10-CM | POA: Diagnosis not present

## 2022-08-16 DIAGNOSIS — W19XXXD Unspecified fall, subsequent encounter: Secondary | ICD-10-CM | POA: Diagnosis not present

## 2022-08-16 DIAGNOSIS — L89620 Pressure ulcer of left heel, unstageable: Secondary | ICD-10-CM | POA: Diagnosis not present

## 2022-08-16 DIAGNOSIS — S82201D Unspecified fracture of shaft of right tibia, subsequent encounter for closed fracture with routine healing: Secondary | ICD-10-CM | POA: Diagnosis not present

## 2022-08-16 DIAGNOSIS — I4821 Permanent atrial fibrillation: Secondary | ICD-10-CM | POA: Diagnosis not present

## 2022-08-16 DIAGNOSIS — Z7901 Long term (current) use of anticoagulants: Secondary | ICD-10-CM | POA: Diagnosis not present

## 2022-08-16 DIAGNOSIS — Z993 Dependence on wheelchair: Secondary | ICD-10-CM | POA: Diagnosis not present

## 2022-08-16 DIAGNOSIS — Z7951 Long term (current) use of inhaled steroids: Secondary | ICD-10-CM | POA: Diagnosis not present

## 2022-08-16 DIAGNOSIS — M1990 Unspecified osteoarthritis, unspecified site: Secondary | ICD-10-CM | POA: Diagnosis not present

## 2022-08-16 DIAGNOSIS — I509 Heart failure, unspecified: Secondary | ICD-10-CM | POA: Diagnosis not present

## 2022-08-16 DIAGNOSIS — D649 Anemia, unspecified: Secondary | ICD-10-CM | POA: Diagnosis not present

## 2022-08-16 NOTE — Telephone Encounter (Signed)
PPW was faxed over this morning. See other TE.

## 2022-08-16 NOTE — Telephone Encounter (Signed)
Lydia with [facility] called to say that hadn't received the POC document [id number]  Patient dropped off document  Plan of Care , to be filled out by provider. Patient requested to send it via Fax within ASAP. Document is located in providers tray at front office.Please advise at  [phone]    She is re-faxing the form and the fax number to send it to will be on the fax

## 2022-08-16 NOTE — Telephone Encounter (Signed)
Order was faxed to them about 15 minutes ago

## 2022-08-16 NOTE — Telephone Encounter (Signed)
Patient dropped off document Home Health Certificate (Order ID [id number]), to be filled out by provider. Patient requested to send it via Fax within 2-days. Document is located in providers tray at front office.Please advise at fax number [phone]

## 2022-08-18 DIAGNOSIS — Z7951 Long term (current) use of inhaled steroids: Secondary | ICD-10-CM | POA: Diagnosis not present

## 2022-08-18 DIAGNOSIS — S82201D Unspecified fracture of shaft of right tibia, subsequent encounter for closed fracture with routine healing: Secondary | ICD-10-CM | POA: Diagnosis not present

## 2022-08-18 DIAGNOSIS — D649 Anemia, unspecified: Secondary | ICD-10-CM | POA: Diagnosis not present

## 2022-08-18 DIAGNOSIS — S82401D Unspecified fracture of shaft of right fibula, subsequent encounter for closed fracture with routine healing: Secondary | ICD-10-CM | POA: Diagnosis not present

## 2022-08-18 DIAGNOSIS — I4821 Permanent atrial fibrillation: Secondary | ICD-10-CM | POA: Diagnosis not present

## 2022-08-18 DIAGNOSIS — Z7901 Long term (current) use of anticoagulants: Secondary | ICD-10-CM | POA: Diagnosis not present

## 2022-08-18 DIAGNOSIS — L89620 Pressure ulcer of left heel, unstageable: Secondary | ICD-10-CM | POA: Diagnosis not present

## 2022-08-18 DIAGNOSIS — I509 Heart failure, unspecified: Secondary | ICD-10-CM | POA: Diagnosis not present

## 2022-08-18 DIAGNOSIS — Z993 Dependence on wheelchair: Secondary | ICD-10-CM | POA: Diagnosis not present

## 2022-08-18 DIAGNOSIS — M1990 Unspecified osteoarthritis, unspecified site: Secondary | ICD-10-CM | POA: Diagnosis not present

## 2022-08-18 DIAGNOSIS — W19XXXD Unspecified fall, subsequent encounter: Secondary | ICD-10-CM | POA: Diagnosis not present

## 2022-08-18 DIAGNOSIS — Z9181 History of falling: Secondary | ICD-10-CM | POA: Diagnosis not present

## 2022-08-18 DIAGNOSIS — Z556 Problems related to health literacy: Secondary | ICD-10-CM | POA: Diagnosis not present

## 2022-08-23 DIAGNOSIS — L89620 Pressure ulcer of left heel, unstageable: Secondary | ICD-10-CM | POA: Diagnosis not present

## 2022-08-23 DIAGNOSIS — Z7951 Long term (current) use of inhaled steroids: Secondary | ICD-10-CM | POA: Diagnosis not present

## 2022-08-23 DIAGNOSIS — D649 Anemia, unspecified: Secondary | ICD-10-CM | POA: Diagnosis not present

## 2022-08-23 DIAGNOSIS — S82201D Unspecified fracture of shaft of right tibia, subsequent encounter for closed fracture with routine healing: Secondary | ICD-10-CM | POA: Diagnosis not present

## 2022-08-23 DIAGNOSIS — S82401D Unspecified fracture of shaft of right fibula, subsequent encounter for closed fracture with routine healing: Secondary | ICD-10-CM | POA: Diagnosis not present

## 2022-08-23 DIAGNOSIS — S92491D Other fracture of right great toe, subsequent encounter for fracture with routine healing: Secondary | ICD-10-CM | POA: Diagnosis not present

## 2022-08-23 DIAGNOSIS — W19XXXD Unspecified fall, subsequent encounter: Secondary | ICD-10-CM | POA: Diagnosis not present

## 2022-08-23 DIAGNOSIS — Z9181 History of falling: Secondary | ICD-10-CM | POA: Diagnosis not present

## 2022-08-23 DIAGNOSIS — I4821 Permanent atrial fibrillation: Secondary | ICD-10-CM | POA: Diagnosis not present

## 2022-08-23 DIAGNOSIS — M1990 Unspecified osteoarthritis, unspecified site: Secondary | ICD-10-CM | POA: Diagnosis not present

## 2022-08-23 DIAGNOSIS — Z556 Problems related to health literacy: Secondary | ICD-10-CM | POA: Diagnosis not present

## 2022-08-23 DIAGNOSIS — I509 Heart failure, unspecified: Secondary | ICD-10-CM | POA: Diagnosis not present

## 2022-08-23 DIAGNOSIS — Z7901 Long term (current) use of anticoagulants: Secondary | ICD-10-CM | POA: Diagnosis not present

## 2022-08-23 DIAGNOSIS — Z993 Dependence on wheelchair: Secondary | ICD-10-CM | POA: Diagnosis not present

## 2022-08-24 DIAGNOSIS — S82401D Unspecified fracture of shaft of right fibula, subsequent encounter for closed fracture with routine healing: Secondary | ICD-10-CM | POA: Diagnosis not present

## 2022-08-24 DIAGNOSIS — S82201D Unspecified fracture of shaft of right tibia, subsequent encounter for closed fracture with routine healing: Secondary | ICD-10-CM | POA: Diagnosis not present

## 2022-08-24 DIAGNOSIS — Z7901 Long term (current) use of anticoagulants: Secondary | ICD-10-CM | POA: Diagnosis not present

## 2022-08-24 DIAGNOSIS — Z556 Problems related to health literacy: Secondary | ICD-10-CM | POA: Diagnosis not present

## 2022-08-24 DIAGNOSIS — M1990 Unspecified osteoarthritis, unspecified site: Secondary | ICD-10-CM | POA: Diagnosis not present

## 2022-08-24 DIAGNOSIS — W19XXXD Unspecified fall, subsequent encounter: Secondary | ICD-10-CM | POA: Diagnosis not present

## 2022-08-24 DIAGNOSIS — D649 Anemia, unspecified: Secondary | ICD-10-CM | POA: Diagnosis not present

## 2022-08-24 DIAGNOSIS — I509 Heart failure, unspecified: Secondary | ICD-10-CM | POA: Diagnosis not present

## 2022-08-24 DIAGNOSIS — I4821 Permanent atrial fibrillation: Secondary | ICD-10-CM | POA: Diagnosis not present

## 2022-08-24 DIAGNOSIS — Z9181 History of falling: Secondary | ICD-10-CM | POA: Diagnosis not present

## 2022-08-24 DIAGNOSIS — Z7951 Long term (current) use of inhaled steroids: Secondary | ICD-10-CM | POA: Diagnosis not present

## 2022-08-24 DIAGNOSIS — L89620 Pressure ulcer of left heel, unstageable: Secondary | ICD-10-CM | POA: Diagnosis not present

## 2022-08-24 DIAGNOSIS — Z993 Dependence on wheelchair: Secondary | ICD-10-CM | POA: Diagnosis not present

## 2022-08-25 DIAGNOSIS — W19XXXD Unspecified fall, subsequent encounter: Secondary | ICD-10-CM | POA: Diagnosis not present

## 2022-08-25 DIAGNOSIS — I4821 Permanent atrial fibrillation: Secondary | ICD-10-CM | POA: Diagnosis not present

## 2022-08-25 DIAGNOSIS — Z7901 Long term (current) use of anticoagulants: Secondary | ICD-10-CM | POA: Diagnosis not present

## 2022-08-25 DIAGNOSIS — Z9181 History of falling: Secondary | ICD-10-CM | POA: Diagnosis not present

## 2022-08-25 DIAGNOSIS — I509 Heart failure, unspecified: Secondary | ICD-10-CM | POA: Diagnosis not present

## 2022-08-25 DIAGNOSIS — Z7951 Long term (current) use of inhaled steroids: Secondary | ICD-10-CM | POA: Diagnosis not present

## 2022-08-25 DIAGNOSIS — S82401D Unspecified fracture of shaft of right fibula, subsequent encounter for closed fracture with routine healing: Secondary | ICD-10-CM | POA: Diagnosis not present

## 2022-08-25 DIAGNOSIS — Z556 Problems related to health literacy: Secondary | ICD-10-CM | POA: Diagnosis not present

## 2022-08-25 DIAGNOSIS — Z993 Dependence on wheelchair: Secondary | ICD-10-CM | POA: Diagnosis not present

## 2022-08-25 DIAGNOSIS — M1990 Unspecified osteoarthritis, unspecified site: Secondary | ICD-10-CM | POA: Diagnosis not present

## 2022-08-25 DIAGNOSIS — D649 Anemia, unspecified: Secondary | ICD-10-CM | POA: Diagnosis not present

## 2022-08-25 DIAGNOSIS — S82201D Unspecified fracture of shaft of right tibia, subsequent encounter for closed fracture with routine healing: Secondary | ICD-10-CM | POA: Diagnosis not present

## 2022-08-25 DIAGNOSIS — L89620 Pressure ulcer of left heel, unstageable: Secondary | ICD-10-CM | POA: Diagnosis not present

## 2022-08-26 DIAGNOSIS — D649 Anemia, unspecified: Secondary | ICD-10-CM | POA: Diagnosis not present

## 2022-08-26 DIAGNOSIS — Z556 Problems related to health literacy: Secondary | ICD-10-CM | POA: Diagnosis not present

## 2022-08-26 DIAGNOSIS — L89620 Pressure ulcer of left heel, unstageable: Secondary | ICD-10-CM | POA: Diagnosis not present

## 2022-08-26 DIAGNOSIS — Z9181 History of falling: Secondary | ICD-10-CM | POA: Diagnosis not present

## 2022-08-26 DIAGNOSIS — Z993 Dependence on wheelchair: Secondary | ICD-10-CM | POA: Diagnosis not present

## 2022-08-26 DIAGNOSIS — I4821 Permanent atrial fibrillation: Secondary | ICD-10-CM | POA: Diagnosis not present

## 2022-08-26 DIAGNOSIS — I509 Heart failure, unspecified: Secondary | ICD-10-CM | POA: Diagnosis not present

## 2022-08-26 DIAGNOSIS — S82201D Unspecified fracture of shaft of right tibia, subsequent encounter for closed fracture with routine healing: Secondary | ICD-10-CM | POA: Diagnosis not present

## 2022-08-26 DIAGNOSIS — M1990 Unspecified osteoarthritis, unspecified site: Secondary | ICD-10-CM | POA: Diagnosis not present

## 2022-08-26 DIAGNOSIS — W19XXXD Unspecified fall, subsequent encounter: Secondary | ICD-10-CM | POA: Diagnosis not present

## 2022-08-26 DIAGNOSIS — S82401D Unspecified fracture of shaft of right fibula, subsequent encounter for closed fracture with routine healing: Secondary | ICD-10-CM | POA: Diagnosis not present

## 2022-08-26 DIAGNOSIS — Z7901 Long term (current) use of anticoagulants: Secondary | ICD-10-CM | POA: Diagnosis not present

## 2022-08-26 DIAGNOSIS — Z7951 Long term (current) use of inhaled steroids: Secondary | ICD-10-CM | POA: Diagnosis not present

## 2022-08-27 ENCOUNTER — Other Ambulatory Visit: Payer: Self-pay | Admitting: Cardiovascular Disease

## 2022-08-29 NOTE — Telephone Encounter (Signed)
Rx(s) sent to pharmacy electronically.  

## 2022-08-30 DIAGNOSIS — Z993 Dependence on wheelchair: Secondary | ICD-10-CM | POA: Diagnosis not present

## 2022-08-30 DIAGNOSIS — D649 Anemia, unspecified: Secondary | ICD-10-CM | POA: Diagnosis not present

## 2022-08-30 DIAGNOSIS — W19XXXD Unspecified fall, subsequent encounter: Secondary | ICD-10-CM | POA: Diagnosis not present

## 2022-08-30 DIAGNOSIS — L89620 Pressure ulcer of left heel, unstageable: Secondary | ICD-10-CM | POA: Diagnosis not present

## 2022-08-30 DIAGNOSIS — Z7901 Long term (current) use of anticoagulants: Secondary | ICD-10-CM | POA: Diagnosis not present

## 2022-08-30 DIAGNOSIS — Z7951 Long term (current) use of inhaled steroids: Secondary | ICD-10-CM | POA: Diagnosis not present

## 2022-08-30 DIAGNOSIS — Z9181 History of falling: Secondary | ICD-10-CM | POA: Diagnosis not present

## 2022-08-30 DIAGNOSIS — S82401D Unspecified fracture of shaft of right fibula, subsequent encounter for closed fracture with routine healing: Secondary | ICD-10-CM | POA: Diagnosis not present

## 2022-08-30 DIAGNOSIS — M1990 Unspecified osteoarthritis, unspecified site: Secondary | ICD-10-CM | POA: Diagnosis not present

## 2022-08-30 DIAGNOSIS — Z556 Problems related to health literacy: Secondary | ICD-10-CM | POA: Diagnosis not present

## 2022-08-30 DIAGNOSIS — I4821 Permanent atrial fibrillation: Secondary | ICD-10-CM | POA: Diagnosis not present

## 2022-08-30 DIAGNOSIS — I509 Heart failure, unspecified: Secondary | ICD-10-CM | POA: Diagnosis not present

## 2022-08-30 DIAGNOSIS — S82201D Unspecified fracture of shaft of right tibia, subsequent encounter for closed fracture with routine healing: Secondary | ICD-10-CM | POA: Diagnosis not present

## 2022-08-31 DIAGNOSIS — Z7901 Long term (current) use of anticoagulants: Secondary | ICD-10-CM | POA: Diagnosis not present

## 2022-08-31 DIAGNOSIS — Z7951 Long term (current) use of inhaled steroids: Secondary | ICD-10-CM | POA: Diagnosis not present

## 2022-08-31 DIAGNOSIS — L89620 Pressure ulcer of left heel, unstageable: Secondary | ICD-10-CM | POA: Diagnosis not present

## 2022-08-31 DIAGNOSIS — Z556 Problems related to health literacy: Secondary | ICD-10-CM | POA: Diagnosis not present

## 2022-08-31 DIAGNOSIS — S82201D Unspecified fracture of shaft of right tibia, subsequent encounter for closed fracture with routine healing: Secondary | ICD-10-CM | POA: Diagnosis not present

## 2022-08-31 DIAGNOSIS — I4821 Permanent atrial fibrillation: Secondary | ICD-10-CM | POA: Diagnosis not present

## 2022-08-31 DIAGNOSIS — M1990 Unspecified osteoarthritis, unspecified site: Secondary | ICD-10-CM | POA: Diagnosis not present

## 2022-08-31 DIAGNOSIS — S82401D Unspecified fracture of shaft of right fibula, subsequent encounter for closed fracture with routine healing: Secondary | ICD-10-CM | POA: Diagnosis not present

## 2022-08-31 DIAGNOSIS — I509 Heart failure, unspecified: Secondary | ICD-10-CM | POA: Diagnosis not present

## 2022-08-31 DIAGNOSIS — Z9181 History of falling: Secondary | ICD-10-CM | POA: Diagnosis not present

## 2022-08-31 DIAGNOSIS — D649 Anemia, unspecified: Secondary | ICD-10-CM | POA: Diagnosis not present

## 2022-08-31 DIAGNOSIS — W19XXXD Unspecified fall, subsequent encounter: Secondary | ICD-10-CM | POA: Diagnosis not present

## 2022-08-31 DIAGNOSIS — Z993 Dependence on wheelchair: Secondary | ICD-10-CM | POA: Diagnosis not present

## 2022-09-01 ENCOUNTER — Encounter: Payer: Self-pay | Admitting: Family Medicine

## 2022-09-02 ENCOUNTER — Telehealth: Payer: Self-pay | Admitting: Family Medicine

## 2022-09-02 ENCOUNTER — Encounter: Payer: Self-pay | Admitting: Family Medicine

## 2022-09-02 NOTE — Telephone Encounter (Signed)
Called his widow and gave condolences.  She thanked me for the call.  She wanted to know if his follow up appointments were already cancelled- per EMR this has been done.  She doesn't need to do anything about that.    He was a kind man who will be missed.

## 2022-09-02 NOTE — Telephone Encounter (Signed)
Patient contacted the office and wanted to inform Dr. Para March her husband, [name] passed away yesterday.

## 2022-09-02 NOTE — Telephone Encounter (Signed)
error 

## 2022-09-06 ENCOUNTER — Ambulatory Visit: Payer: Medicare Other | Admitting: Cardiovascular Disease

## 2022-09-12 DIAGNOSIS — S82201D Unspecified fracture of shaft of right tibia, subsequent encounter for closed fracture with routine healing: Secondary | ICD-10-CM | POA: Diagnosis not present

## 2022-09-13 DIAGNOSIS — S82401D Unspecified fracture of shaft of right fibula, subsequent encounter for closed fracture with routine healing: Secondary | ICD-10-CM | POA: Diagnosis not present

## 2022-09-13 DIAGNOSIS — Z993 Dependence on wheelchair: Secondary | ICD-10-CM | POA: Diagnosis not present

## 2022-09-13 DIAGNOSIS — Z556 Problems related to health literacy: Secondary | ICD-10-CM | POA: Diagnosis not present

## 2022-09-13 DIAGNOSIS — I509 Heart failure, unspecified: Secondary | ICD-10-CM | POA: Diagnosis not present

## 2022-09-13 DIAGNOSIS — M1990 Unspecified osteoarthritis, unspecified site: Secondary | ICD-10-CM | POA: Diagnosis not present

## 2022-09-13 DIAGNOSIS — L89620 Pressure ulcer of left heel, unstageable: Secondary | ICD-10-CM | POA: Diagnosis not present

## 2022-09-13 DIAGNOSIS — S82201D Unspecified fracture of shaft of right tibia, subsequent encounter for closed fracture with routine healing: Secondary | ICD-10-CM | POA: Diagnosis not present

## 2022-09-13 DIAGNOSIS — Z7951 Long term (current) use of inhaled steroids: Secondary | ICD-10-CM | POA: Diagnosis not present

## 2022-09-13 DIAGNOSIS — Z9181 History of falling: Secondary | ICD-10-CM | POA: Diagnosis not present

## 2022-09-13 DIAGNOSIS — I4821 Permanent atrial fibrillation: Secondary | ICD-10-CM | POA: Diagnosis not present

## 2022-09-13 DIAGNOSIS — D649 Anemia, unspecified: Secondary | ICD-10-CM | POA: Diagnosis not present

## 2022-09-13 DIAGNOSIS — Z7901 Long term (current) use of anticoagulants: Secondary | ICD-10-CM | POA: Diagnosis not present

## 2022-09-13 DIAGNOSIS — W19XXXD Unspecified fall, subsequent encounter: Secondary | ICD-10-CM | POA: Diagnosis not present

## 2022-09-19 ENCOUNTER — Other Ambulatory Visit: Payer: Self-pay | Admitting: Family Medicine

## 2022-09-20 ENCOUNTER — Other Ambulatory Visit: Payer: Self-pay

## 2022-09-20 DIAGNOSIS — Z993 Dependence on wheelchair: Secondary | ICD-10-CM | POA: Diagnosis not present

## 2022-09-20 DIAGNOSIS — L89620 Pressure ulcer of left heel, unstageable: Secondary | ICD-10-CM | POA: Diagnosis not present

## 2022-09-20 DIAGNOSIS — W19XXXD Unspecified fall, subsequent encounter: Secondary | ICD-10-CM | POA: Diagnosis not present

## 2022-09-20 DIAGNOSIS — S82201D Unspecified fracture of shaft of right tibia, subsequent encounter for closed fracture with routine healing: Secondary | ICD-10-CM | POA: Diagnosis not present

## 2022-09-20 DIAGNOSIS — I4821 Permanent atrial fibrillation: Secondary | ICD-10-CM | POA: Diagnosis not present

## 2022-09-20 DIAGNOSIS — I509 Heart failure, unspecified: Secondary | ICD-10-CM | POA: Diagnosis not present

## 2022-09-20 DIAGNOSIS — M1990 Unspecified osteoarthritis, unspecified site: Secondary | ICD-10-CM | POA: Diagnosis not present

## 2022-09-20 DIAGNOSIS — Z556 Problems related to health literacy: Secondary | ICD-10-CM | POA: Diagnosis not present

## 2022-09-20 DIAGNOSIS — D649 Anemia, unspecified: Secondary | ICD-10-CM | POA: Diagnosis not present

## 2022-09-20 DIAGNOSIS — Z9181 History of falling: Secondary | ICD-10-CM | POA: Diagnosis not present

## 2022-09-20 DIAGNOSIS — Z7951 Long term (current) use of inhaled steroids: Secondary | ICD-10-CM | POA: Diagnosis not present

## 2022-09-20 DIAGNOSIS — Z7901 Long term (current) use of anticoagulants: Secondary | ICD-10-CM | POA: Diagnosis not present

## 2022-09-20 DIAGNOSIS — S82401D Unspecified fracture of shaft of right fibula, subsequent encounter for closed fracture with routine healing: Secondary | ICD-10-CM | POA: Diagnosis not present

## 2022-09-20 MED ORDER — POTASSIUM CHLORIDE CRYS ER 20 MEQ PO TBCR: 40.0000 meq | EXTENDED_RELEASE_TABLET | Freq: Every day | ORAL | 1 refills | Status: DC

## 2022-09-22 DIAGNOSIS — Z9181 History of falling: Secondary | ICD-10-CM | POA: Diagnosis not present

## 2022-09-22 DIAGNOSIS — L89620 Pressure ulcer of left heel, unstageable: Secondary | ICD-10-CM | POA: Diagnosis not present

## 2022-09-22 DIAGNOSIS — Z7951 Long term (current) use of inhaled steroids: Secondary | ICD-10-CM | POA: Diagnosis not present

## 2022-09-22 DIAGNOSIS — Z993 Dependence on wheelchair: Secondary | ICD-10-CM | POA: Diagnosis not present

## 2022-09-22 DIAGNOSIS — Z7901 Long term (current) use of anticoagulants: Secondary | ICD-10-CM | POA: Diagnosis not present

## 2022-09-22 DIAGNOSIS — I509 Heart failure, unspecified: Secondary | ICD-10-CM | POA: Diagnosis not present

## 2022-09-22 DIAGNOSIS — M1990 Unspecified osteoarthritis, unspecified site: Secondary | ICD-10-CM | POA: Diagnosis not present

## 2022-09-22 DIAGNOSIS — S82401D Unspecified fracture of shaft of right fibula, subsequent encounter for closed fracture with routine healing: Secondary | ICD-10-CM | POA: Diagnosis not present

## 2022-09-22 DIAGNOSIS — I4821 Permanent atrial fibrillation: Secondary | ICD-10-CM | POA: Diagnosis not present

## 2022-09-22 DIAGNOSIS — S82201D Unspecified fracture of shaft of right tibia, subsequent encounter for closed fracture with routine healing: Secondary | ICD-10-CM | POA: Diagnosis not present

## 2022-09-22 DIAGNOSIS — D649 Anemia, unspecified: Secondary | ICD-10-CM | POA: Diagnosis not present

## 2022-09-22 DIAGNOSIS — Z556 Problems related to health literacy: Secondary | ICD-10-CM | POA: Diagnosis not present

## 2022-09-22 DIAGNOSIS — W19XXXD Unspecified fall, subsequent encounter: Secondary | ICD-10-CM | POA: Diagnosis not present

## 2022-09-23 DIAGNOSIS — W19XXXD Unspecified fall, subsequent encounter: Secondary | ICD-10-CM | POA: Diagnosis not present

## 2022-09-23 DIAGNOSIS — Z7901 Long term (current) use of anticoagulants: Secondary | ICD-10-CM | POA: Diagnosis not present

## 2022-09-23 DIAGNOSIS — Z556 Problems related to health literacy: Secondary | ICD-10-CM | POA: Diagnosis not present

## 2022-09-23 DIAGNOSIS — Z9181 History of falling: Secondary | ICD-10-CM | POA: Diagnosis not present

## 2022-09-23 DIAGNOSIS — M1990 Unspecified osteoarthritis, unspecified site: Secondary | ICD-10-CM | POA: Diagnosis not present

## 2022-09-23 DIAGNOSIS — L89620 Pressure ulcer of left heel, unstageable: Secondary | ICD-10-CM | POA: Diagnosis not present

## 2022-09-23 DIAGNOSIS — I509 Heart failure, unspecified: Secondary | ICD-10-CM | POA: Diagnosis not present

## 2022-09-23 DIAGNOSIS — S82401D Unspecified fracture of shaft of right fibula, subsequent encounter for closed fracture with routine healing: Secondary | ICD-10-CM | POA: Diagnosis not present

## 2022-09-23 DIAGNOSIS — Z7951 Long term (current) use of inhaled steroids: Secondary | ICD-10-CM | POA: Diagnosis not present

## 2022-09-23 DIAGNOSIS — S82201D Unspecified fracture of shaft of right tibia, subsequent encounter for closed fracture with routine healing: Secondary | ICD-10-CM | POA: Diagnosis not present

## 2022-09-23 DIAGNOSIS — Z993 Dependence on wheelchair: Secondary | ICD-10-CM | POA: Diagnosis not present

## 2022-09-23 DIAGNOSIS — I4821 Permanent atrial fibrillation: Secondary | ICD-10-CM | POA: Diagnosis not present

## 2022-09-23 DIAGNOSIS — D649 Anemia, unspecified: Secondary | ICD-10-CM | POA: Diagnosis not present

## 2022-09-27 DIAGNOSIS — I4821 Permanent atrial fibrillation: Secondary | ICD-10-CM | POA: Diagnosis not present

## 2022-09-27 DIAGNOSIS — W19XXXD Unspecified fall, subsequent encounter: Secondary | ICD-10-CM | POA: Diagnosis not present

## 2022-09-27 DIAGNOSIS — Z556 Problems related to health literacy: Secondary | ICD-10-CM | POA: Diagnosis not present

## 2022-09-27 DIAGNOSIS — I509 Heart failure, unspecified: Secondary | ICD-10-CM | POA: Diagnosis not present

## 2022-09-27 DIAGNOSIS — Z9181 History of falling: Secondary | ICD-10-CM | POA: Diagnosis not present

## 2022-09-27 DIAGNOSIS — Z7951 Long term (current) use of inhaled steroids: Secondary | ICD-10-CM | POA: Diagnosis not present

## 2022-09-27 DIAGNOSIS — L89620 Pressure ulcer of left heel, unstageable: Secondary | ICD-10-CM | POA: Diagnosis not present

## 2022-09-27 DIAGNOSIS — Z993 Dependence on wheelchair: Secondary | ICD-10-CM | POA: Diagnosis not present

## 2022-09-27 DIAGNOSIS — S82201D Unspecified fracture of shaft of right tibia, subsequent encounter for closed fracture with routine healing: Secondary | ICD-10-CM | POA: Diagnosis not present

## 2022-09-27 DIAGNOSIS — S82401D Unspecified fracture of shaft of right fibula, subsequent encounter for closed fracture with routine healing: Secondary | ICD-10-CM | POA: Diagnosis not present

## 2022-09-27 DIAGNOSIS — Z7901 Long term (current) use of anticoagulants: Secondary | ICD-10-CM | POA: Diagnosis not present

## 2022-09-27 DIAGNOSIS — D649 Anemia, unspecified: Secondary | ICD-10-CM | POA: Diagnosis not present

## 2022-09-27 DIAGNOSIS — M1990 Unspecified osteoarthritis, unspecified site: Secondary | ICD-10-CM | POA: Diagnosis not present

## 2022-09-28 DIAGNOSIS — Z7901 Long term (current) use of anticoagulants: Secondary | ICD-10-CM | POA: Diagnosis not present

## 2022-09-28 DIAGNOSIS — M1990 Unspecified osteoarthritis, unspecified site: Secondary | ICD-10-CM | POA: Diagnosis not present

## 2022-09-28 DIAGNOSIS — D649 Anemia, unspecified: Secondary | ICD-10-CM | POA: Diagnosis not present

## 2022-09-28 DIAGNOSIS — Z9181 History of falling: Secondary | ICD-10-CM | POA: Diagnosis not present

## 2022-09-28 DIAGNOSIS — L89620 Pressure ulcer of left heel, unstageable: Secondary | ICD-10-CM | POA: Diagnosis not present

## 2022-09-28 DIAGNOSIS — Z993 Dependence on wheelchair: Secondary | ICD-10-CM | POA: Diagnosis not present

## 2022-09-28 DIAGNOSIS — S82401D Unspecified fracture of shaft of right fibula, subsequent encounter for closed fracture with routine healing: Secondary | ICD-10-CM | POA: Diagnosis not present

## 2022-09-28 DIAGNOSIS — I509 Heart failure, unspecified: Secondary | ICD-10-CM | POA: Diagnosis not present

## 2022-09-28 DIAGNOSIS — I4821 Permanent atrial fibrillation: Secondary | ICD-10-CM | POA: Diagnosis not present

## 2022-09-28 DIAGNOSIS — W19XXXD Unspecified fall, subsequent encounter: Secondary | ICD-10-CM | POA: Diagnosis not present

## 2022-09-28 DIAGNOSIS — Z7951 Long term (current) use of inhaled steroids: Secondary | ICD-10-CM | POA: Diagnosis not present

## 2022-09-28 DIAGNOSIS — Z556 Problems related to health literacy: Secondary | ICD-10-CM | POA: Diagnosis not present

## 2022-09-28 DIAGNOSIS — S82201D Unspecified fracture of shaft of right tibia, subsequent encounter for closed fracture with routine healing: Secondary | ICD-10-CM | POA: Diagnosis not present

## 2022-09-29 ENCOUNTER — Encounter: Payer: Medicare Other | Admitting: Pharmacist

## 2022-09-29 DIAGNOSIS — D649 Anemia, unspecified: Secondary | ICD-10-CM | POA: Diagnosis not present

## 2022-09-29 DIAGNOSIS — M1990 Unspecified osteoarthritis, unspecified site: Secondary | ICD-10-CM | POA: Diagnosis not present

## 2022-09-29 DIAGNOSIS — Z7901 Long term (current) use of anticoagulants: Secondary | ICD-10-CM | POA: Diagnosis not present

## 2022-09-29 DIAGNOSIS — I4821 Permanent atrial fibrillation: Secondary | ICD-10-CM | POA: Diagnosis not present

## 2022-09-29 DIAGNOSIS — Z7951 Long term (current) use of inhaled steroids: Secondary | ICD-10-CM | POA: Diagnosis not present

## 2022-09-29 DIAGNOSIS — L89620 Pressure ulcer of left heel, unstageable: Secondary | ICD-10-CM | POA: Diagnosis not present

## 2022-09-29 DIAGNOSIS — Z556 Problems related to health literacy: Secondary | ICD-10-CM | POA: Diagnosis not present

## 2022-09-29 DIAGNOSIS — S82201D Unspecified fracture of shaft of right tibia, subsequent encounter for closed fracture with routine healing: Secondary | ICD-10-CM | POA: Diagnosis not present

## 2022-09-29 DIAGNOSIS — Z993 Dependence on wheelchair: Secondary | ICD-10-CM | POA: Diagnosis not present

## 2022-09-29 DIAGNOSIS — I509 Heart failure, unspecified: Secondary | ICD-10-CM | POA: Diagnosis not present

## 2022-09-29 DIAGNOSIS — W19XXXD Unspecified fall, subsequent encounter: Secondary | ICD-10-CM | POA: Diagnosis not present

## 2022-09-29 DIAGNOSIS — S82401D Unspecified fracture of shaft of right fibula, subsequent encounter for closed fracture with routine healing: Secondary | ICD-10-CM | POA: Diagnosis not present

## 2022-09-29 DIAGNOSIS — Z9181 History of falling: Secondary | ICD-10-CM | POA: Diagnosis not present

## 2022-09-30 ENCOUNTER — Telehealth: Payer: Self-pay | Admitting: Pharmacist

## 2022-09-30 ENCOUNTER — Telehealth: Payer: Self-pay | Admitting: Family Medicine

## 2022-09-30 NOTE — Progress Notes (Signed)
Contacted patient regarding upcoming appointment with [facility] pharmacist. Per clinical review, no pharmacist appointment needed at this time. Patient denies any medication needs at this time. Appointment canceled.   Catie T. Bunnie Rehberg, PharmD, BCACP, CPP Clinical Pharmacist [facility] [phone]  

## 2022-09-30 NOTE — Telephone Encounter (Signed)
Printed and placed in [facility] folder to complete upon return.

## 2022-09-30 NOTE — Telephone Encounter (Signed)
Patient dropped off document Home Health Certificate (Order ID [id number]), to be filled out by provider. Patient requested to send it via Fax within 5-days. Document is located in providers s drive

## 2022-10-02 NOTE — Telephone Encounter (Signed)
Will work on it when possible.  Thanks.

## 2022-10-04 DIAGNOSIS — I272 Pulmonary hypertension, unspecified: Secondary | ICD-10-CM | POA: Diagnosis not present

## 2022-10-04 DIAGNOSIS — I509 Heart failure, unspecified: Secondary | ICD-10-CM | POA: Diagnosis not present

## 2022-10-04 DIAGNOSIS — I4821 Permanent atrial fibrillation: Secondary | ICD-10-CM | POA: Diagnosis not present

## 2022-10-04 DIAGNOSIS — I739 Peripheral vascular disease, unspecified: Secondary | ICD-10-CM | POA: Diagnosis not present

## 2022-10-04 DIAGNOSIS — F32A Depression, unspecified: Secondary | ICD-10-CM | POA: Diagnosis not present

## 2022-10-04 DIAGNOSIS — K589 Irritable bowel syndrome without diarrhea: Secondary | ICD-10-CM | POA: Diagnosis not present

## 2022-10-04 DIAGNOSIS — W19XXXD Unspecified fall, subsequent encounter: Secondary | ICD-10-CM | POA: Diagnosis not present

## 2022-10-04 DIAGNOSIS — Z9181 History of falling: Secondary | ICD-10-CM | POA: Diagnosis not present

## 2022-10-04 DIAGNOSIS — L89610 Pressure ulcer of right heel, unstageable: Secondary | ICD-10-CM | POA: Diagnosis not present

## 2022-10-04 DIAGNOSIS — I110 Hypertensive heart disease with heart failure: Secondary | ICD-10-CM | POA: Diagnosis not present

## 2022-10-04 DIAGNOSIS — M810 Age-related osteoporosis without current pathological fracture: Secondary | ICD-10-CM | POA: Diagnosis not present

## 2022-10-04 DIAGNOSIS — S82201D Unspecified fracture of shaft of right tibia, subsequent encounter for closed fracture with routine healing: Secondary | ICD-10-CM | POA: Diagnosis not present

## 2022-10-04 DIAGNOSIS — D649 Anemia, unspecified: Secondary | ICD-10-CM | POA: Diagnosis not present

## 2022-10-04 DIAGNOSIS — S82401D Unspecified fracture of shaft of right fibula, subsequent encounter for closed fracture with routine healing: Secondary | ICD-10-CM | POA: Diagnosis not present

## 2022-10-04 DIAGNOSIS — R251 Tremor, unspecified: Secondary | ICD-10-CM | POA: Diagnosis not present

## 2022-10-04 DIAGNOSIS — Z7901 Long term (current) use of anticoagulants: Secondary | ICD-10-CM | POA: Diagnosis not present

## 2022-10-04 DIAGNOSIS — Z79899 Other long term (current) drug therapy: Secondary | ICD-10-CM | POA: Diagnosis not present

## 2022-10-04 DIAGNOSIS — K219 Gastro-esophageal reflux disease without esophagitis: Secondary | ICD-10-CM | POA: Diagnosis not present

## 2022-10-04 DIAGNOSIS — M1990 Unspecified osteoarthritis, unspecified site: Secondary | ICD-10-CM | POA: Diagnosis not present

## 2022-10-05 ENCOUNTER — Encounter: Payer: Medicare Other | Admitting: Pharmacist

## 2022-10-07 DIAGNOSIS — I4821 Permanent atrial fibrillation: Secondary | ICD-10-CM | POA: Diagnosis not present

## 2022-10-07 DIAGNOSIS — D649 Anemia, unspecified: Secondary | ICD-10-CM | POA: Diagnosis not present

## 2022-10-07 DIAGNOSIS — Z7901 Long term (current) use of anticoagulants: Secondary | ICD-10-CM | POA: Diagnosis not present

## 2022-10-07 DIAGNOSIS — R251 Tremor, unspecified: Secondary | ICD-10-CM | POA: Diagnosis not present

## 2022-10-07 DIAGNOSIS — W19XXXD Unspecified fall, subsequent encounter: Secondary | ICD-10-CM | POA: Diagnosis not present

## 2022-10-07 DIAGNOSIS — S82201D Unspecified fracture of shaft of right tibia, subsequent encounter for closed fracture with routine healing: Secondary | ICD-10-CM | POA: Diagnosis not present

## 2022-10-07 DIAGNOSIS — I509 Heart failure, unspecified: Secondary | ICD-10-CM | POA: Diagnosis not present

## 2022-10-07 DIAGNOSIS — K219 Gastro-esophageal reflux disease without esophagitis: Secondary | ICD-10-CM | POA: Diagnosis not present

## 2022-10-07 DIAGNOSIS — S82401D Unspecified fracture of shaft of right fibula, subsequent encounter for closed fracture with routine healing: Secondary | ICD-10-CM | POA: Diagnosis not present

## 2022-10-07 DIAGNOSIS — I739 Peripheral vascular disease, unspecified: Secondary | ICD-10-CM | POA: Diagnosis not present

## 2022-10-07 DIAGNOSIS — F32A Depression, unspecified: Secondary | ICD-10-CM | POA: Diagnosis not present

## 2022-10-07 DIAGNOSIS — M1990 Unspecified osteoarthritis, unspecified site: Secondary | ICD-10-CM | POA: Diagnosis not present

## 2022-10-07 DIAGNOSIS — L89610 Pressure ulcer of right heel, unstageable: Secondary | ICD-10-CM | POA: Diagnosis not present

## 2022-10-07 DIAGNOSIS — I272 Pulmonary hypertension, unspecified: Secondary | ICD-10-CM | POA: Diagnosis not present

## 2022-10-07 DIAGNOSIS — M810 Age-related osteoporosis without current pathological fracture: Secondary | ICD-10-CM | POA: Diagnosis not present

## 2022-10-07 DIAGNOSIS — K589 Irritable bowel syndrome without diarrhea: Secondary | ICD-10-CM | POA: Diagnosis not present

## 2022-10-07 DIAGNOSIS — I110 Hypertensive heart disease with heart failure: Secondary | ICD-10-CM | POA: Diagnosis not present

## 2022-10-07 DIAGNOSIS — Z9181 History of falling: Secondary | ICD-10-CM | POA: Diagnosis not present

## 2022-10-07 DIAGNOSIS — Z79899 Other long term (current) drug therapy: Secondary | ICD-10-CM | POA: Diagnosis not present

## 2022-10-11 DIAGNOSIS — M810 Age-related osteoporosis without current pathological fracture: Secondary | ICD-10-CM | POA: Diagnosis not present

## 2022-10-11 DIAGNOSIS — D649 Anemia, unspecified: Secondary | ICD-10-CM | POA: Diagnosis not present

## 2022-10-11 DIAGNOSIS — Z7901 Long term (current) use of anticoagulants: Secondary | ICD-10-CM | POA: Diagnosis not present

## 2022-10-11 DIAGNOSIS — L89610 Pressure ulcer of right heel, unstageable: Secondary | ICD-10-CM | POA: Diagnosis not present

## 2022-10-11 DIAGNOSIS — M1990 Unspecified osteoarthritis, unspecified site: Secondary | ICD-10-CM | POA: Diagnosis not present

## 2022-10-11 DIAGNOSIS — R251 Tremor, unspecified: Secondary | ICD-10-CM | POA: Diagnosis not present

## 2022-10-11 DIAGNOSIS — F32A Depression, unspecified: Secondary | ICD-10-CM | POA: Diagnosis not present

## 2022-10-11 DIAGNOSIS — S82401D Unspecified fracture of shaft of right fibula, subsequent encounter for closed fracture with routine healing: Secondary | ICD-10-CM | POA: Diagnosis not present

## 2022-10-11 DIAGNOSIS — I509 Heart failure, unspecified: Secondary | ICD-10-CM | POA: Diagnosis not present

## 2022-10-11 DIAGNOSIS — K589 Irritable bowel syndrome without diarrhea: Secondary | ICD-10-CM | POA: Diagnosis not present

## 2022-10-11 DIAGNOSIS — I110 Hypertensive heart disease with heart failure: Secondary | ICD-10-CM | POA: Diagnosis not present

## 2022-10-11 DIAGNOSIS — I272 Pulmonary hypertension, unspecified: Secondary | ICD-10-CM | POA: Diagnosis not present

## 2022-10-11 DIAGNOSIS — I739 Peripheral vascular disease, unspecified: Secondary | ICD-10-CM | POA: Diagnosis not present

## 2022-10-11 DIAGNOSIS — Z9181 History of falling: Secondary | ICD-10-CM | POA: Diagnosis not present

## 2022-10-11 DIAGNOSIS — K219 Gastro-esophageal reflux disease without esophagitis: Secondary | ICD-10-CM | POA: Diagnosis not present

## 2022-10-11 DIAGNOSIS — W19XXXD Unspecified fall, subsequent encounter: Secondary | ICD-10-CM | POA: Diagnosis not present

## 2022-10-11 DIAGNOSIS — I4821 Permanent atrial fibrillation: Secondary | ICD-10-CM | POA: Diagnosis not present

## 2022-10-11 DIAGNOSIS — Z79899 Other long term (current) drug therapy: Secondary | ICD-10-CM | POA: Diagnosis not present

## 2022-10-11 DIAGNOSIS — S82201D Unspecified fracture of shaft of right tibia, subsequent encounter for closed fracture with routine healing: Secondary | ICD-10-CM | POA: Diagnosis not present

## 2022-10-12 DIAGNOSIS — D649 Anemia, unspecified: Secondary | ICD-10-CM | POA: Diagnosis not present

## 2022-10-12 DIAGNOSIS — K589 Irritable bowel syndrome without diarrhea: Secondary | ICD-10-CM | POA: Diagnosis not present

## 2022-10-12 DIAGNOSIS — I739 Peripheral vascular disease, unspecified: Secondary | ICD-10-CM | POA: Diagnosis not present

## 2022-10-12 DIAGNOSIS — L89610 Pressure ulcer of right heel, unstageable: Secondary | ICD-10-CM | POA: Diagnosis not present

## 2022-10-12 DIAGNOSIS — F32A Depression, unspecified: Secondary | ICD-10-CM | POA: Diagnosis not present

## 2022-10-12 DIAGNOSIS — R251 Tremor, unspecified: Secondary | ICD-10-CM | POA: Diagnosis not present

## 2022-10-12 DIAGNOSIS — I110 Hypertensive heart disease with heart failure: Secondary | ICD-10-CM | POA: Diagnosis not present

## 2022-10-12 DIAGNOSIS — Z9181 History of falling: Secondary | ICD-10-CM | POA: Diagnosis not present

## 2022-10-12 DIAGNOSIS — M1990 Unspecified osteoarthritis, unspecified site: Secondary | ICD-10-CM | POA: Diagnosis not present

## 2022-10-12 DIAGNOSIS — M810 Age-related osteoporosis without current pathological fracture: Secondary | ICD-10-CM | POA: Diagnosis not present

## 2022-10-12 DIAGNOSIS — K219 Gastro-esophageal reflux disease without esophagitis: Secondary | ICD-10-CM | POA: Diagnosis not present

## 2022-10-12 DIAGNOSIS — S82401D Unspecified fracture of shaft of right fibula, subsequent encounter for closed fracture with routine healing: Secondary | ICD-10-CM | POA: Diagnosis not present

## 2022-10-12 DIAGNOSIS — I4821 Permanent atrial fibrillation: Secondary | ICD-10-CM | POA: Diagnosis not present

## 2022-10-12 DIAGNOSIS — Z79899 Other long term (current) drug therapy: Secondary | ICD-10-CM | POA: Diagnosis not present

## 2022-10-12 DIAGNOSIS — S82201D Unspecified fracture of shaft of right tibia, subsequent encounter for closed fracture with routine healing: Secondary | ICD-10-CM | POA: Diagnosis not present

## 2022-10-12 DIAGNOSIS — I272 Pulmonary hypertension, unspecified: Secondary | ICD-10-CM | POA: Diagnosis not present

## 2022-10-12 DIAGNOSIS — Z7901 Long term (current) use of anticoagulants: Secondary | ICD-10-CM | POA: Diagnosis not present

## 2022-10-12 DIAGNOSIS — I509 Heart failure, unspecified: Secondary | ICD-10-CM | POA: Diagnosis not present

## 2022-10-12 DIAGNOSIS — W19XXXD Unspecified fall, subsequent encounter: Secondary | ICD-10-CM | POA: Diagnosis not present

## 2022-10-18 DIAGNOSIS — S92491D Other fracture of right great toe, subsequent encounter for fracture with routine healing: Secondary | ICD-10-CM | POA: Diagnosis not present

## 2022-10-18 DIAGNOSIS — S82201K Unspecified fracture of shaft of right tibia, subsequent encounter for closed fracture with nonunion: Secondary | ICD-10-CM | POA: Diagnosis not present

## 2022-10-19 DIAGNOSIS — S82401D Unspecified fracture of shaft of right fibula, subsequent encounter for closed fracture with routine healing: Secondary | ICD-10-CM | POA: Diagnosis not present

## 2022-10-19 DIAGNOSIS — M1990 Unspecified osteoarthritis, unspecified site: Secondary | ICD-10-CM | POA: Diagnosis not present

## 2022-10-19 DIAGNOSIS — Z79899 Other long term (current) drug therapy: Secondary | ICD-10-CM | POA: Diagnosis not present

## 2022-10-19 DIAGNOSIS — I110 Hypertensive heart disease with heart failure: Secondary | ICD-10-CM | POA: Diagnosis not present

## 2022-10-19 DIAGNOSIS — W19XXXD Unspecified fall, subsequent encounter: Secondary | ICD-10-CM | POA: Diagnosis not present

## 2022-10-19 DIAGNOSIS — R251 Tremor, unspecified: Secondary | ICD-10-CM | POA: Diagnosis not present

## 2022-10-19 DIAGNOSIS — D649 Anemia, unspecified: Secondary | ICD-10-CM | POA: Diagnosis not present

## 2022-10-19 DIAGNOSIS — S82201D Unspecified fracture of shaft of right tibia, subsequent encounter for closed fracture with routine healing: Secondary | ICD-10-CM | POA: Diagnosis not present

## 2022-10-19 DIAGNOSIS — K219 Gastro-esophageal reflux disease without esophagitis: Secondary | ICD-10-CM | POA: Diagnosis not present

## 2022-10-19 DIAGNOSIS — I4821 Permanent atrial fibrillation: Secondary | ICD-10-CM | POA: Diagnosis not present

## 2022-10-19 DIAGNOSIS — L89610 Pressure ulcer of right heel, unstageable: Secondary | ICD-10-CM | POA: Diagnosis not present

## 2022-10-19 DIAGNOSIS — Z7901 Long term (current) use of anticoagulants: Secondary | ICD-10-CM | POA: Diagnosis not present

## 2022-10-19 DIAGNOSIS — M810 Age-related osteoporosis without current pathological fracture: Secondary | ICD-10-CM | POA: Diagnosis not present

## 2022-10-19 DIAGNOSIS — K589 Irritable bowel syndrome without diarrhea: Secondary | ICD-10-CM | POA: Diagnosis not present

## 2022-10-19 DIAGNOSIS — I272 Pulmonary hypertension, unspecified: Secondary | ICD-10-CM | POA: Diagnosis not present

## 2022-10-19 DIAGNOSIS — F32A Depression, unspecified: Secondary | ICD-10-CM | POA: Diagnosis not present

## 2022-10-19 DIAGNOSIS — I509 Heart failure, unspecified: Secondary | ICD-10-CM | POA: Diagnosis not present

## 2022-10-19 DIAGNOSIS — Z9181 History of falling: Secondary | ICD-10-CM | POA: Diagnosis not present

## 2022-10-19 DIAGNOSIS — I739 Peripheral vascular disease, unspecified: Secondary | ICD-10-CM | POA: Diagnosis not present

## 2022-10-23 ENCOUNTER — Other Ambulatory Visit: Payer: Self-pay | Admitting: Cardiovascular Disease

## 2022-10-23 DIAGNOSIS — Z5181 Encounter for therapeutic drug level monitoring: Secondary | ICD-10-CM

## 2022-10-24 ENCOUNTER — Other Ambulatory Visit: Payer: Self-pay | Admitting: Nurse Practitioner

## 2022-10-25 DIAGNOSIS — Z7901 Long term (current) use of anticoagulants: Secondary | ICD-10-CM | POA: Diagnosis not present

## 2022-10-25 DIAGNOSIS — Z79899 Other long term (current) drug therapy: Secondary | ICD-10-CM | POA: Diagnosis not present

## 2022-10-25 DIAGNOSIS — I4821 Permanent atrial fibrillation: Secondary | ICD-10-CM | POA: Diagnosis not present

## 2022-10-25 DIAGNOSIS — I739 Peripheral vascular disease, unspecified: Secondary | ICD-10-CM | POA: Diagnosis not present

## 2022-10-25 DIAGNOSIS — Z9181 History of falling: Secondary | ICD-10-CM | POA: Diagnosis not present

## 2022-10-25 DIAGNOSIS — F32A Depression, unspecified: Secondary | ICD-10-CM | POA: Diagnosis not present

## 2022-10-25 DIAGNOSIS — S82201D Unspecified fracture of shaft of right tibia, subsequent encounter for closed fracture with routine healing: Secondary | ICD-10-CM | POA: Diagnosis not present

## 2022-10-25 DIAGNOSIS — I509 Heart failure, unspecified: Secondary | ICD-10-CM | POA: Diagnosis not present

## 2022-10-25 DIAGNOSIS — K589 Irritable bowel syndrome without diarrhea: Secondary | ICD-10-CM | POA: Diagnosis not present

## 2022-10-25 DIAGNOSIS — K219 Gastro-esophageal reflux disease without esophagitis: Secondary | ICD-10-CM | POA: Diagnosis not present

## 2022-10-25 DIAGNOSIS — D649 Anemia, unspecified: Secondary | ICD-10-CM | POA: Diagnosis not present

## 2022-10-25 DIAGNOSIS — I272 Pulmonary hypertension, unspecified: Secondary | ICD-10-CM | POA: Diagnosis not present

## 2022-10-25 DIAGNOSIS — W19XXXD Unspecified fall, subsequent encounter: Secondary | ICD-10-CM | POA: Diagnosis not present

## 2022-10-25 DIAGNOSIS — L89610 Pressure ulcer of right heel, unstageable: Secondary | ICD-10-CM | POA: Diagnosis not present

## 2022-10-25 DIAGNOSIS — I110 Hypertensive heart disease with heart failure: Secondary | ICD-10-CM | POA: Diagnosis not present

## 2022-10-25 DIAGNOSIS — M1990 Unspecified osteoarthritis, unspecified site: Secondary | ICD-10-CM | POA: Diagnosis not present

## 2022-10-25 DIAGNOSIS — M810 Age-related osteoporosis without current pathological fracture: Secondary | ICD-10-CM | POA: Diagnosis not present

## 2022-10-25 DIAGNOSIS — R251 Tremor, unspecified: Secondary | ICD-10-CM | POA: Diagnosis not present

## 2022-10-25 DIAGNOSIS — S82401D Unspecified fracture of shaft of right fibula, subsequent encounter for closed fracture with routine healing: Secondary | ICD-10-CM | POA: Diagnosis not present

## 2022-10-26 ENCOUNTER — Other Ambulatory Visit: Payer: Self-pay | Admitting: Family Medicine

## 2022-10-26 NOTE — Telephone Encounter (Signed)
Sent. Thanks.   

## 2022-10-26 NOTE — Telephone Encounter (Signed)
Refill request for TRAMADOL HCL 50 MG TABLET   LOV - 08/08/22 Next OV - not scheduled Last refill - 07/06/22 #60/0

## 2022-11-01 DIAGNOSIS — G5601 Carpal tunnel syndrome, right upper limb: Secondary | ICD-10-CM | POA: Diagnosis not present

## 2022-11-03 DIAGNOSIS — W19XXXD Unspecified fall, subsequent encounter: Secondary | ICD-10-CM | POA: Diagnosis not present

## 2022-11-03 DIAGNOSIS — I272 Pulmonary hypertension, unspecified: Secondary | ICD-10-CM | POA: Diagnosis not present

## 2022-11-03 DIAGNOSIS — Z7901 Long term (current) use of anticoagulants: Secondary | ICD-10-CM | POA: Diagnosis not present

## 2022-11-03 DIAGNOSIS — K219 Gastro-esophageal reflux disease without esophagitis: Secondary | ICD-10-CM | POA: Diagnosis not present

## 2022-11-03 DIAGNOSIS — R251 Tremor, unspecified: Secondary | ICD-10-CM | POA: Diagnosis not present

## 2022-11-03 DIAGNOSIS — M810 Age-related osteoporosis without current pathological fracture: Secondary | ICD-10-CM | POA: Diagnosis not present

## 2022-11-03 DIAGNOSIS — S82201D Unspecified fracture of shaft of right tibia, subsequent encounter for closed fracture with routine healing: Secondary | ICD-10-CM | POA: Diagnosis not present

## 2022-11-03 DIAGNOSIS — M1990 Unspecified osteoarthritis, unspecified site: Secondary | ICD-10-CM | POA: Diagnosis not present

## 2022-11-03 DIAGNOSIS — I509 Heart failure, unspecified: Secondary | ICD-10-CM | POA: Diagnosis not present

## 2022-11-03 DIAGNOSIS — D649 Anemia, unspecified: Secondary | ICD-10-CM | POA: Diagnosis not present

## 2022-11-03 DIAGNOSIS — S82401D Unspecified fracture of shaft of right fibula, subsequent encounter for closed fracture with routine healing: Secondary | ICD-10-CM | POA: Diagnosis not present

## 2022-11-03 DIAGNOSIS — I110 Hypertensive heart disease with heart failure: Secondary | ICD-10-CM | POA: Diagnosis not present

## 2022-11-03 DIAGNOSIS — I739 Peripheral vascular disease, unspecified: Secondary | ICD-10-CM | POA: Diagnosis not present

## 2022-11-03 DIAGNOSIS — Z79899 Other long term (current) drug therapy: Secondary | ICD-10-CM | POA: Diagnosis not present

## 2022-11-03 DIAGNOSIS — L89610 Pressure ulcer of right heel, unstageable: Secondary | ICD-10-CM | POA: Diagnosis not present

## 2022-11-03 DIAGNOSIS — Z9181 History of falling: Secondary | ICD-10-CM | POA: Diagnosis not present

## 2022-11-03 DIAGNOSIS — F32A Depression, unspecified: Secondary | ICD-10-CM | POA: Diagnosis not present

## 2022-11-03 DIAGNOSIS — I4821 Permanent atrial fibrillation: Secondary | ICD-10-CM | POA: Diagnosis not present

## 2022-11-03 DIAGNOSIS — K589 Irritable bowel syndrome without diarrhea: Secondary | ICD-10-CM | POA: Diagnosis not present

## 2022-11-09 DIAGNOSIS — I509 Heart failure, unspecified: Secondary | ICD-10-CM | POA: Diagnosis not present

## 2022-11-09 DIAGNOSIS — I272 Pulmonary hypertension, unspecified: Secondary | ICD-10-CM | POA: Diagnosis not present

## 2022-11-09 DIAGNOSIS — D649 Anemia, unspecified: Secondary | ICD-10-CM | POA: Diagnosis not present

## 2022-11-09 DIAGNOSIS — I110 Hypertensive heart disease with heart failure: Secondary | ICD-10-CM | POA: Diagnosis not present

## 2022-11-09 DIAGNOSIS — M1990 Unspecified osteoarthritis, unspecified site: Secondary | ICD-10-CM | POA: Diagnosis not present

## 2022-11-09 DIAGNOSIS — F32A Depression, unspecified: Secondary | ICD-10-CM | POA: Diagnosis not present

## 2022-11-09 DIAGNOSIS — K589 Irritable bowel syndrome without diarrhea: Secondary | ICD-10-CM | POA: Diagnosis not present

## 2022-11-09 DIAGNOSIS — K219 Gastro-esophageal reflux disease without esophagitis: Secondary | ICD-10-CM | POA: Diagnosis not present

## 2022-11-09 DIAGNOSIS — Z7901 Long term (current) use of anticoagulants: Secondary | ICD-10-CM | POA: Diagnosis not present

## 2022-11-09 DIAGNOSIS — Z79899 Other long term (current) drug therapy: Secondary | ICD-10-CM | POA: Diagnosis not present

## 2022-11-09 DIAGNOSIS — M810 Age-related osteoporosis without current pathological fracture: Secondary | ICD-10-CM | POA: Diagnosis not present

## 2022-11-09 DIAGNOSIS — I739 Peripheral vascular disease, unspecified: Secondary | ICD-10-CM | POA: Diagnosis not present

## 2022-11-09 DIAGNOSIS — S82401D Unspecified fracture of shaft of right fibula, subsequent encounter for closed fracture with routine healing: Secondary | ICD-10-CM | POA: Diagnosis not present

## 2022-11-09 DIAGNOSIS — S82201D Unspecified fracture of shaft of right tibia, subsequent encounter for closed fracture with routine healing: Secondary | ICD-10-CM | POA: Diagnosis not present

## 2022-11-09 DIAGNOSIS — L89610 Pressure ulcer of right heel, unstageable: Secondary | ICD-10-CM | POA: Diagnosis not present

## 2022-11-09 DIAGNOSIS — W19XXXD Unspecified fall, subsequent encounter: Secondary | ICD-10-CM | POA: Diagnosis not present

## 2022-11-09 DIAGNOSIS — I4821 Permanent atrial fibrillation: Secondary | ICD-10-CM | POA: Diagnosis not present

## 2022-11-09 DIAGNOSIS — R251 Tremor, unspecified: Secondary | ICD-10-CM | POA: Diagnosis not present

## 2022-11-09 DIAGNOSIS — Z9181 History of falling: Secondary | ICD-10-CM | POA: Diagnosis not present

## 2022-11-10 ENCOUNTER — Other Ambulatory Visit: Payer: Self-pay | Admitting: Cardiovascular Disease

## 2022-11-10 DIAGNOSIS — I4891 Unspecified atrial fibrillation: Secondary | ICD-10-CM

## 2022-11-10 NOTE — Telephone Encounter (Signed)
Eliquis 2.5mg  refill request received. Patient is 89 years old, weight-68kg, Crea-0.80 on 08/05/22, Diagnosis-Afib, and last seen by Dr. Royann Shivers on 05/20/22.   Per Dr Royann Shivers on 04/21/21 the pt should remain on eliquis 2.5mg  bid, please see below:  Croitoru, Rachelle Hora, MD  to Sandi Mariscal, RN  04/21/21  1:42 PM Please leave Mrs. [name] on the 2.5 mg twice daily dose of  Eliquis  Will send in refill to requested pharmacy.

## 2022-11-11 ENCOUNTER — Telehealth: Payer: Self-pay | Admitting: Family Medicine

## 2022-11-11 NOTE — Telephone Encounter (Signed)
Prescription Request  11/11/2022  LOV: 08/08/2022  What is the name of the medication or equipment? [email or web address]  Have you contacted your pharmacy to request a refill? Yes   Which pharmacy would you like this sent to?  [id number] Judithann Sheen, [place] - [address] [place] [address] Phone: [phone] Fax: [phone]    Patient notified that their request is being sent to the clinical staff for review and that they should receive a response within 2 business days.   Please advise at [facility] [phone]

## 2022-11-14 NOTE — Telephone Encounter (Signed)
Zofran is not on her med list; okay to refill?

## 2022-11-15 ENCOUNTER — Ambulatory Visit (INDEPENDENT_AMBULATORY_CARE_PROVIDER_SITE_OTHER): Payer: Medicare Other | Admitting: Internal Medicine

## 2022-11-15 ENCOUNTER — Encounter: Payer: Self-pay | Admitting: Internal Medicine

## 2022-11-15 ENCOUNTER — Telehealth: Payer: Self-pay

## 2022-11-15 VITALS — BP 116/70 | HR 96 | Temp 97.8°F | Ht 65.0 in | Wt 150.0 lb

## 2022-11-15 DIAGNOSIS — K2100 Gastro-esophageal reflux disease with esophagitis, without bleeding: Secondary | ICD-10-CM

## 2022-11-15 DIAGNOSIS — I428 Other cardiomyopathies: Secondary | ICD-10-CM | POA: Diagnosis not present

## 2022-11-15 DIAGNOSIS — I4891 Unspecified atrial fibrillation: Secondary | ICD-10-CM | POA: Diagnosis not present

## 2022-11-15 LAB — RENAL FUNCTION PANEL
Albumin: 3.4 g/dL — ABNORMAL LOW (ref 3.5–5.2)
BUN: 23 mg/dL (ref 6–23)
CO2: 24 mEq/L (ref 19–32)
Calcium: 9.3 mg/dL (ref 8.4–10.5)
Chloride: 98 mEq/L (ref 96–112)
Creatinine, Ser: 0.95 mg/dL (ref 0.40–1.20)
GFR: 53.03 mL/min — ABNORMAL LOW (ref >60.00)
Glucose, Bld: 105 mg/dL — ABNORMAL HIGH (ref 70–99)
Phosphorus: 2.6 mg/dL (ref 2.3–4.6)
Potassium: 4.9 mEq/L (ref 3.5–5.1)
Sodium: 132 mEq/L — ABNORMAL LOW (ref 135–145)

## 2022-11-15 LAB — HEPATIC FUNCTION PANEL
ALT: 11 U/L (ref 0–35)
AST: 18 U/L (ref 0–37)
Albumin: 3.4 g/dL — ABNORMAL LOW (ref 3.5–5.2)
Alkaline Phosphatase: 114 U/L (ref 39–117)
Bilirubin, Direct: 0.2 mg/dL (ref 0.0–0.3)
Total Bilirubin: 0.7 mg/dL (ref 0.2–1.2)
Total Protein: 6.6 g/dL (ref 6.0–8.3)

## 2022-11-15 LAB — CBC
HCT: 37.7 % (ref 36.0–46.0)
Hemoglobin: 12.1 g/dL (ref 12.0–15.0)
MCHC: 32.1 g/dL (ref 30.0–36.0)
MCV: 96.5 fl (ref 78.0–100.0)
Platelets: 194 10*3/uL (ref 150.0–400.0)
RBC: 3.91 Mil/uL (ref 3.87–5.11)
RDW: 13.6 % (ref 11.5–15.5)
WBC: 7.8 10*3/uL (ref 4.0–10.5)

## 2022-11-15 LAB — BRAIN NATRIURETIC PEPTIDE: Pro B Natriuretic peptide (BNP): 877 pg/mL — ABNORMAL HIGH (ref 0.0–100.0)

## 2022-11-15 MED ORDER — ONDANSETRON HCL 4 MG PO TABS: 4.0000 mg | ORAL_TABLET | Freq: Three times a day (TID) | ORAL | 0 refills | Status: DC | PRN

## 2022-11-15 NOTE — Progress Notes (Signed)
Subjective:    Patient ID: [name], female    DOB: [birth year], 89 y.o.   MRN: [id number]  HPI Here due to not feeling well over the past 5 days--with daughter  Thought she ate something bad--had started with gagging after dinner (upset stomach) Some dry heaving. Still with no appetite and still gags when eating Drinking okay Now feels bad Just wants to sleep all the time Feels depressed---"like my head is full of cotton"  Just lost husband Now having trouble walking even on walker  No sore throat--just hoarseness Some cough No SOB Feels like throat is very dry---food is hanging down  Takes pantoprazole daily--but not on empty stomach  Felt really cold, etc a few nights ago  Current Outpatient Medications on File Prior to Visit  Medication Sig Dispense Refill   acetaminophen (TYLENOL) 500 MG tablet Take 2 tablets (1,000 mg total) by mouth every 8 (eight) hours as needed.     calcium carbonate (TUMS - DOSED IN MG ELEMENTAL CALCIUM) 500 MG chewable tablet Chew 500 mg by mouth 2 (two) times daily as needed for indigestion or heartburn.     cholecalciferol (VITAMIN D3) 25 MCG (1000 UNIT) tablet Take 1,000 Units by mouth in the morning.     digoxin (LANOXIN) 0.125 MG tablet 1/4 tab by mouth daily. 45 tablet 3   ELIQUIS 2.5 MG TABS tablet TAKE 1 TABLET BY MOUTH TWICE A DAY 180 tablet 1   fluticasone (FLONASE) 50 MCG/ACT nasal spray SPRAY 2 SPRAYS INTO EACH NOSTRIL EVERY DAY 48 mL 2   loperamide (IMODIUM) 2 MG capsule Take 2 mg by mouth 3 (three) times daily as needed for diarrhea or loose stools.      loratadine (CLARITIN) 10 MG tablet Take 10 mg by mouth daily.     metoprolol succinate (TOPROL-XL) 100 MG 24 hr tablet TAKE 1 TABLET BY MOUTH EVERY DAY WITH OR IMMEDIATELY FOLLOWING A MEAL 90 tablet 0   ondansetron (ZOFRAN) 4 MG tablet Take 1 tablet (4 mg total) by mouth every 8 (eight) hours as needed for nausea or vomiting. 20 tablet 0   pantoprazole (PROTONIX) 20 MG tablet  TAKE 1 TABLET BY MOUTH EVERY DAY 90 tablet 1   potassium chloride SA (KLOR-CON M20) 20 MEQ tablet Take 2 tablets (40 mEq total) by mouth daily. 180 tablet 1   Povidone (IVIZIA DRY EYES OP) Apply to eye.     torsemide (DEMADEX) 10 MG tablet Take 1 tablet (10 mg total) by mouth daily. 135 tablet 3   traMADol (ULTRAM) 50 MG tablet TAKE 1 TABLET BY MOUTH 3 TIMES DAILY AS NEEDED. 30 tablet 1   venlafaxine XR (EFFEXOR-XR) 37.5 MG 24 hr capsule TAKE 2 CAPSULES (75 MG TOTAL) BY MOUTH EVERY MORNING. 180 capsule 1   No current facility-administered medications on file prior to visit.    Allergies  Allergen Reactions   Ace Inhibitors Cough   Warfarin Sodium Other (See Comments)    DOSE RELATED PHARMACOLOGIC EFFECT "bleed out"   Famotidine Other (See Comments)    GI upset   Vancomycin Nausea Only   Codeine Other (See Comments)    sedation   Delsym [Dextromethorphan Polistirex Er] Other (See Comments)    dizziness   Lactose Intolerance (Gi) Diarrhea   Lasix [Furosemide] Diarrhea    Oral Lasix gives her diarrhea, tolerates IV Rx   Phenylephrine Palpitations and Other (See Comments)    Nasal spray- "likely increase in nasal congestion".    Sulfamethoxazole-Trimethoprim Nausea  And Vomiting    GI intolerance.    Past Medical History:  Diagnosis Date   Acute combined systolic and diastolic heart failure (HCC) 05/02/2017   Anemia    Anxiety    Arthritis    "knees" (06/18/2015)   Atrial fibrillation with RVR (HCC) 06/18/2015   h/o sig bleed on coumadin   Cardiogenic shock 05/02/2017   Complication of anesthesia 2010   "w/cardiac cath; got into a psychotic state for ~ 3 days; got me out w/Valium"   Depression    "I have it off and on; not as often as I've gotten older" (06/18/2015)   Diverticulosis    descending and sigmoid colon--Dr. Jarold Motto   DJD (degenerative joint disease) of knee    left   Dyspnea    with exertion and in morning mostly when wakes up    E coli infection    History  of   GERD (gastroesophageal reflux disease)    History of blood transfusion    History of Clostridium difficile colitis    IBS (irritable bowel syndrome)    Insomnia     off chronic ambien 5mg  as of 10/11, rare/episodic use since then  DCM/CHF   LBBB (left bundle branch block)    Lymphocytic colitis 09/27/2016   Moderate to severe pulmonary hypertension (HCC) 11/26/2019   Nonischemic cardiomyopathy (HCC)    normal coronaries, EF 15-20% 04/2008, EF 50-55% 2015   Osteoporosis    on DXA 05/2010   Positive PPD    Retroperitoneal bleed    Sciatica    Right   Urinary incontinence    Occassionally    Past Surgical History:  Procedure Laterality Date   ANTERIOR APPROACH HEMI HIP ARTHROPLASTY Left 08/05/2019   Procedure: ANTERIOR APPROACH HEMI HIP ARTHROPLASTY;  Surgeon: Jodi Geralds, MD;  Location: [facility] OR;  Service: Orthopedics;  Laterality: Left;   BIOPSY  11/22/2017   Procedure: BIOPSY;  Surgeon: Iva Boop, MD;  Location: [facility] END[facility];  Service: Endoscopy;;   CARDIAC CATHETERIZATION  2010   CARDIOVERSION N/A 06/19/2015   Procedure: CARDIOVERSION;  Surgeon: Laurey Morale, MD;  Location: [facility] ENDOSCOPY;  Service: Cardiovascular;  Laterality: N/A;   CATARACT EXTRACTION W/ INTRAOCULAR LENS  IMPLANT, BILATERAL Bilateral    COLONOSCOPY WITH PROPOFOL N/A 11/22/2017   Procedure: COLONOSCOPY WITH PROPOFOL;  Surgeon: Iva Boop, MD;  Location: [facility] ENDOSCOPY;  Service: Endoscopy;  Laterality: N/A;   DILATION AND CURETTAGE OF UTERUS     ORIF ANKLE FRACTURE Right 09/22/2020   Procedure: RIGHT ANKLE OPEN REDUCTION INTERNAL FIXATION (ORIF);  Surgeon: [name], MD;  Location: [facility];  Service: Orthopedics;  Laterality: Right;   ORIF TIBIA PLATEAU Right 06/28/2022   Procedure: OPEN REDUCTION INTERNAL FIXATION (ORIF) TIBIA;  Surgeon: Roby Lofts, MD;  Location: [facility] OR;  Service: Orthopedics;  Laterality: Right;   TEE WITHOUT CARDIOVERSION N/A 06/19/2015   Procedure: TRANSESOPHAGEAL  ECHOCARDIOGRAM (TEE);  Surgeon: Laurey Morale, MD;  Location: [facility] ENDOSCOPY;  Service: Cardiovascular;  Laterality: N/A;   TOTAL KNEE ARTHROPLASTY Left 04/25/2017   Procedure: TOTAL KNEE ARTHROPLASTY;  Surgeon: [name], MD;  Location: [facility] OR;  Service: Orthopedics;  Laterality: Left;   TOTAL KNEE ARTHROPLASTY Right 02/18/2020   Procedure: RIGHT TOTAL KNEE ARTHROPLASTY;  Surgeon: [name], MD;  Location: [facility];  Service: Orthopedics;  Laterality: Right;   TOTAL KNEE ARTHROPLASTY Right 06/16/2020   Procedure: RIGHT RETINACULAR REPAIR;  Surgeon: [name], MD;  Location: [facility];  Service: Orthopedics;  Laterality:  Right;   TOTAL KNEE REVISION Left 12/03/2019   Procedure: LEFT TOTAL KNEE REVISION  OF POYLY EXCHANGE;  Surgeon: [name], MD;  Location: [facility];  Service: Orthopedics;  Laterality: Left;   VAGINAL HYSTERECTOMY  1979   ovaries intact    Family History  Problem Relation Age of Onset   Colon cancer Father        possible colon cancer   Other Father        esophagus tumor?   Tuberculosis Mother    Breast cancer Neg Hx    Stomach cancer Neg Hx    Pancreatic cancer Neg Hx     Social History   Socioeconomic History   Marital status: Widowed    Spouse name: Dorene Sorrow    Number of children: 2   Years of education: 13   Highest education level: Not on file  Occupational History   Occupation: Retired    Associate Professor: RETIRED  Tobacco Use   Smoking status: Never   Smokeless tobacco: Never  Vaping Use   Vaping status: Never Used  Substance and Sexual Activity   Alcohol use: No    Alcohol/week: 0.0 standard drinks of alcohol   Drug use: No   Sexual activity: Not Currently    Birth control/protection: Post-menopausal, Surgical    Comment: Hysterectomy  Other Topics Concern   Not on file  Social History Narrative   Retired, former [employer]   Widowed 2024   Was married, 1953   Lives with husband   2 grown children, one in [place], one out of  state   Tobacco Use - No.    Drug Use - no   teaches Sunday school, reading   Social Determinants of Health   Financial Resource Strain: Low Risk  (07/12/2021)   Overall Financial Resource Strain (CARDIA)    Difficulty of Paying Living Expenses: Not hard at all  Food Insecurity: No Food Insecurity (08/11/2022)   Hunger Vital Sign    Worried About Running Out of Food in the Last Year: Never true    Ran Out of Food in the Last Year: Never true  Transportation Needs: No Transportation Needs (08/11/2022)   PRAPARE - Administrator, Civil Service (Medical): No    Lack of Transportation (Non-Medical): No  Physical Activity: Sufficiently Active (08/11/2022)   Exercise Vital Sign    Days of Exercise per Week: 7 days    Minutes of Exercise per Session: 30 min  Stress: No Stress Concern Present (08/11/2022)   [employer] of Occupational Health - Occupational Stress Questionnaire    Feeling of Stress : Not at all  Social Connections: Moderately Isolated (08/11/2022)   Social Connection and Isolation Panel [NHANES]    Frequency of Communication with Friends and Family: More than three times a week    Frequency of Social Gatherings with Friends and Family: More than three times a week    Attends Religious Services: Never    Database administrator or Organizations: No    Attends Banker Meetings: Never    Marital Status: Married  Catering manager Violence: Not At Risk (08/11/2022)   Humiliation, Afraid, Rape, and Kick questionnaire    Fear of Current or Ex-Partner: No    Emotionally Abused: No    Physically Abused: No    Sexually Abused: No   Review of Systems Sleeping a lot--"want to sleep all the time" CHF in the past--no edema now Bowels moving fine Lives with daughter--daughter  does all the housework (but she has her own apartment and fixes coffee, etc)    Objective:   Physical Exam         Assessment & Plan:

## 2022-11-15 NOTE — Telephone Encounter (Signed)
Spoke to patient's daughter [name].I received a message from Dr.Croitoru and Dr.Letvak to schedule your mother a appointment  with a PA before Fri 8/9.Appointment scheduled with Gillian Shields NP Thurs 8/8 at 2:45 pm at [facility] office.Dr.Croitoru advised to keep appointment with him Tue 8/13 at 1:30 pm.

## 2022-11-15 NOTE — Assessment & Plan Note (Signed)
Rate close to 130 Likely causing fatigue and trouble walking, etc Recommended ER but she prefers not---messaged Dr Royann Shivers about seeing her today or tomorrow I am reluctant to increase dig wihtout a level--I will check Asked daughter to give her an extra 50mg  of metoprolol now and daily

## 2022-11-15 NOTE — Telephone Encounter (Signed)
Please get update on patient. Sent. Thanks.

## 2022-11-15 NOTE — Assessment & Plan Note (Addendum)
Had gagging spell and now having throat symptoms Will increase pantoprazole to 40mg  daily--empty stomach--till symptoms improve

## 2022-11-15 NOTE — Addendum Note (Signed)
Addended by: [name] on: 11/15/2022 07:02 AM   Modules accepted: Orders

## 2022-11-15 NOTE — Assessment & Plan Note (Signed)
Doesn't seem to be in heart failure Could be over-diuresed with dry mouth, etc---but I am reluctant to have her stop with the rapid a fib

## 2022-11-15 NOTE — Telephone Encounter (Signed)
Patient is having nausea and stomach upset. Already has an appt today at 11:15 am with Dr. Alphonsus Sias.

## 2022-11-15 NOTE — Patient Instructions (Signed)
Please increase the metoprolol to 150mg  daily till you see the cardiologist. Take 40mg  of pantoprazole daily on an empty stomach now--to hopefully improve the throat symptoms. Dr C will see you Friday at 11:40---unless they contact you with an earlier appointment

## 2022-11-16 DIAGNOSIS — W19XXXD Unspecified fall, subsequent encounter: Secondary | ICD-10-CM | POA: Diagnosis not present

## 2022-11-16 DIAGNOSIS — I272 Pulmonary hypertension, unspecified: Secondary | ICD-10-CM | POA: Diagnosis not present

## 2022-11-16 DIAGNOSIS — I739 Peripheral vascular disease, unspecified: Secondary | ICD-10-CM | POA: Diagnosis not present

## 2022-11-16 DIAGNOSIS — S82401D Unspecified fracture of shaft of right fibula, subsequent encounter for closed fracture with routine healing: Secondary | ICD-10-CM | POA: Diagnosis not present

## 2022-11-16 DIAGNOSIS — K219 Gastro-esophageal reflux disease without esophagitis: Secondary | ICD-10-CM | POA: Diagnosis not present

## 2022-11-16 DIAGNOSIS — R251 Tremor, unspecified: Secondary | ICD-10-CM | POA: Diagnosis not present

## 2022-11-16 DIAGNOSIS — M1990 Unspecified osteoarthritis, unspecified site: Secondary | ICD-10-CM | POA: Diagnosis not present

## 2022-11-16 DIAGNOSIS — L89610 Pressure ulcer of right heel, unstageable: Secondary | ICD-10-CM | POA: Diagnosis not present

## 2022-11-16 DIAGNOSIS — M810 Age-related osteoporosis without current pathological fracture: Secondary | ICD-10-CM | POA: Diagnosis not present

## 2022-11-16 DIAGNOSIS — S82201D Unspecified fracture of shaft of right tibia, subsequent encounter for closed fracture with routine healing: Secondary | ICD-10-CM | POA: Diagnosis not present

## 2022-11-16 DIAGNOSIS — Z79899 Other long term (current) drug therapy: Secondary | ICD-10-CM | POA: Diagnosis not present

## 2022-11-16 DIAGNOSIS — I509 Heart failure, unspecified: Secondary | ICD-10-CM | POA: Diagnosis not present

## 2022-11-16 DIAGNOSIS — K589 Irritable bowel syndrome without diarrhea: Secondary | ICD-10-CM | POA: Diagnosis not present

## 2022-11-16 DIAGNOSIS — Z7901 Long term (current) use of anticoagulants: Secondary | ICD-10-CM | POA: Diagnosis not present

## 2022-11-16 DIAGNOSIS — I110 Hypertensive heart disease with heart failure: Secondary | ICD-10-CM | POA: Diagnosis not present

## 2022-11-16 DIAGNOSIS — Z9181 History of falling: Secondary | ICD-10-CM | POA: Diagnosis not present

## 2022-11-16 DIAGNOSIS — I4821 Permanent atrial fibrillation: Secondary | ICD-10-CM | POA: Diagnosis not present

## 2022-11-16 DIAGNOSIS — D649 Anemia, unspecified: Secondary | ICD-10-CM | POA: Diagnosis not present

## 2022-11-16 NOTE — Progress Notes (Signed)
Hypokalemia is mild, but may be part of the reason she feels so weak.  It does not look like she is in bad CHF exacerbation

## 2022-11-17 ENCOUNTER — Ambulatory Visit (HOSPITAL_BASED_OUTPATIENT_CLINIC_OR_DEPARTMENT_OTHER): Payer: Medicare Other | Admitting: Family

## 2022-11-17 ENCOUNTER — Encounter (HOSPITAL_BASED_OUTPATIENT_CLINIC_OR_DEPARTMENT_OTHER): Payer: Self-pay | Admitting: Family

## 2022-11-17 VITALS — BP 133/76 | HR 89 | Ht 65.0 in | Wt 151.0 lb

## 2022-11-17 DIAGNOSIS — I4821 Permanent atrial fibrillation: Secondary | ICD-10-CM | POA: Diagnosis not present

## 2022-11-17 DIAGNOSIS — D6859 Other primary thrombophilia: Secondary | ICD-10-CM

## 2022-11-17 DIAGNOSIS — Z5181 Encounter for therapeutic drug level monitoring: Secondary | ICD-10-CM

## 2022-11-17 DIAGNOSIS — I447 Left bundle-branch block, unspecified: Secondary | ICD-10-CM

## 2022-11-17 DIAGNOSIS — I5022 Chronic systolic (congestive) heart failure: Secondary | ICD-10-CM | POA: Diagnosis not present

## 2022-11-17 MED ORDER — METOPROLOL SUCCINATE ER 25 MG PO TB24
ORAL_TABLET | ORAL | 3 refills | Status: DC
Start: 2022-11-17 — End: 2022-11-22

## 2022-11-17 MED ORDER — METOPROLOL SUCCINATE ER 100 MG PO TB24: ORAL_TABLET | ORAL | 3 refills | Status: DC

## 2022-11-17 NOTE — Progress Notes (Signed)
Cardiology Office Note:  .   Date:  11/17/2022  ID:  [name], DOB 03-30-1931, MRN [id number] PCP: Catherine Nam, MD  [facility] Providers Cardiologist:  Catherine Fair, MD    History of Present Illness: .   [name] is a 89 y.o. female with history of longstanding persistent atrial fibrillation, chronic systolic heart failure (2019 LVEF 50% in setting of A-fib RVR ? 2021 LVEF 35% ? 06/2020 LVEF 25-30%, 06/2022 LVEF 25-30%), PAH, chronic LBBB, microscopic colitis. Prior spontaneous retroperitoneal bleed on warfarin many years ago.  Did not tolerate RAAS inhibitors due to variety of side effects including hypotension and cough on ACE.  Frequent UTI make her a poor candidate for SGLT2i and Farxiga when trialed caused profound weakness.  Rate control of atrial fibrillation has been obtained with reduction in metoprolol. Amiodarone had to be stopped previously for side effects.   Last seen in clinic 05/20/2022 by Dr. Royann Eaton.  She was taking torsemide 10 mg daily with improvement in prior exertional dyspnea though still fatigued.  She was noted to have narrow compensation range going into heart failure symptoms when only gaining 4 to 5 pounds.    Admitted 3/17 - 07/01/2022 for mechanical fall with right tibia/fibula fracture with ORIF and surgical fixation 06/28/22. Echo 06/28/2022 LVEF 25 to 30%, LA moderately dilated, RA mildly dilated, mild AI.  Saw PCP office 11/15/22 in setting of upset stomach recommended to increase Metoprolol from 100mg  to 150mg  daily until follow up with cardiology. Protonix increased to 40mg  for gagging symptoms.Labs 11/15/22 Digoxin 0.8, creatinine 0.95, GFR 53, Na 132, K 4.9, Hb 12.1, normal ALT/AST.   Presents today for follow-up with her daughter.  Shares with me that her husband passed away in 08-26-2022 and offered my condolences. She is living with her daughter and son-in-law who are now managing her medications.  We discussed her metoprolol dosing at length.   Her AVS 05/2022 and 06/2022 all detail a Toprol 150mg  daily dose. Appears when her husband was doing her meds prior to his passing, he inadvertently was giving her 100mg  instead of 150mg  and daughter has continued same. Her daughter notes [name] was more stressed last week as her daughter went out of town and thinks [name] may have had a panic attack leading to the recurrent atrial fib with RVR.  Home dry weight 149 lbs and has been steadily 149-150 lbs at home. Taking Torsemide most days -we again discussed the need for daily dosing.  She notes generalized fatigue. Worries Metoprolol 150mg  dose may be contributory and only took 100mg  this morning (did take increased dose for 2 days after visit at PCP office). Discussed that recovery from her leg fracture, grief, deconditioning, uncontrolled atrial fib could all also contribute to fatigue. She previously completed PT and has not been continuing her exercises.   ROS: Please see the history of present illness.    All other systems reviewed and are negative.   Studies Reviewed: [name]   EKG Interpretation Date/Time:  Thursday November 17 2022 14:34:49 EDT Ventricular Rate:  89 PR Interval:    QRS Duration:  146 QT Interval:  388 QTC Calculation: 472 R Axis:   -53  Text Interpretation: Atrial fibrillation Stable Left bundle branch block Confirmed by [name] ([id number]) on 11/17/2022 2:36:23 PM    Cardiac Studies & Procedures     STRESS TESTS  NM MYOCAR MULTI W/SPECT W 05/04/2017  Narrative CLINICAL DATA:  Heart failure, rapid atrial fibrillation and  cardiomyopathy.  EXAM: MYOCARDIAL IMAGING WITH SPECT (REST AND PHARMACOLOGIC-STRESS)  GATED LEFT VENTRICULAR WALL MOTION STUDY  LEFT VENTRICULAR EJECTION FRACTION  TECHNIQUE: Standard myocardial SPECT imaging was performed after resting intravenous injection of 10 mCi Tc-14m tetrofosmin. Subsequently, intravenous infusion of Lexiscan was performed under the supervision of the  Cardiology staff. At peak effect of the drug, 30 mCi Tc-42m tetrofosmin was injected intravenously and standard myocardial SPECT imaging was performed. Quantitative gated imaging was also performed to evaluate left ventricular wall motion, and estimate left ventricular ejection fraction.  COMPARISON:  None.  FINDINGS: Perfusion: There is suggestion potential subtle apical, distal septal and distal anteroseptal ischemia. There does appear to be a component of mild diminished perfusion of the distal anterior wall on both rest and stress studies. This may partly be due to overlying breast soft tissue.  Wall Motion: The septum is dyskinetic. The left ventricular apex is nearly akinetic. The rest of the left ventricle shows severe global hypokinesis with significant left ventricular cavity dilatation.  Left Ventricular Ejection Fraction: 23%.  End diastolic volume: 409 ml  End systolic volume: 811 ml  IMPRESSION: 1. Potential subtle ischemia involving the left ventricular apex, distal septum and distal anteroseptal wall. Mildly diminished perfusion on both stress and rest studies of the distal anterior wall may be related to scar or breast soft tissue attenuation.  2. Severe wall motion abnormalities with septal dyskinesis, apical akinesis and otherwise global hypokinesis. The left ventricular cavity is significantly dilated.  3. Left ventricular ejection fraction: 23%.  4. Non invasive risk stratification*: High  *2012 Appropriate Use Criteria for Coronary Revascularization Focused Update: J Am Coll Cardiol. 2012;59(9):857-881. http://content.dementiazones.com.aspx?articleid=1201161   Electronically Signed By: Irish Lack M.D. On: 05/04/2017 14:46   ECHOCARDIOGRAM  ECHOCARDIOGRAM COMPLETE 06/28/2022  Narrative ECHOCARDIOGRAM REPORT    Patient Name:   [name] Date of Exam: 06/28/2022 Medical Rec #:  [id number]       Height:       65.0 in Accession  #:    [id number]      Weight:       160.9 lb Date of Birth:  05/21/1930       BSA:          1.804 m Patient Age:    89 years        BP:           104/52 mmHg Patient Gender: F               HR:           61 bpm. Exam Location:  Inpatient  Procedure: 2D Echo  Indications:    atrial fibrillation  History:        Patient has prior history of Echocardiogram examinations, most recent 06/24/2020. Arrythmias:LBBB; Risk Factors:Hypertension.  Sonographer:    Delcie Roch RDCS Referring Phys: [phone] RALPH A NETTEY  IMPRESSIONS   1. Left ventricular ejection fraction, by estimation, is 25 to 30%. The left ventricle has severely decreased function. The left ventricle demonstrates global hypokinesis with septal-lateral dyssynchrony consistent with LBBB. Left ventricular diastolic parameters are indeterminate. 2. Right ventricular systolic function is mildly reduced. The right ventricular size is normal. There is mildly elevated pulmonary artery systolic pressure. The estimated right ventricular systolic pressure is 37.3 mmHg. 3. Left atrial size was moderately dilated. 4. Right atrial size was mildly dilated. 5. The mitral valve is normal in structure. No evidence of mitral valve regurgitation. No evidence of mitral stenosis. 6. The aortic  valve is tricuspid. There is mild calcification of the aortic valve. Aortic valve regurgitation is mild. No aortic stenosis is present. 7. The inferior vena cava is normal in size with greater than 50% respiratory variability, suggesting right atrial pressure of 3 mmHg. 8. The patient was in atrial fibrillation.  FINDINGS Left Ventricle: Left ventricular ejection fraction, by estimation, is 25 to 30%. The left ventricle has severely decreased function. The left ventricle demonstrates global hypokinesis. The left ventricular internal cavity size was normal in size. There is no left ventricular hypertrophy. Left ventricular diastolic parameters are  indeterminate.  Right Ventricle: The right ventricular size is normal. No increase in right ventricular wall thickness. Right ventricular systolic function is mildly reduced. There is mildly elevated pulmonary artery systolic pressure. The tricuspid regurgitant velocity is 2.93 m/s, and with an assumed right atrial pressure of 3 mmHg, the estimated right ventricular systolic pressure is 37.3 mmHg.  Left Atrium: Left atrial size was moderately dilated.  Right Atrium: Right atrial size was mildly dilated.  Pericardium: There is no evidence of pericardial effusion.  Mitral Valve: The mitral valve is normal in structure. No evidence of mitral valve regurgitation. No evidence of mitral valve stenosis.  Tricuspid Valve: The tricuspid valve is normal in structure. Tricuspid valve regurgitation is mild.  Aortic Valve: The aortic valve is tricuspid. There is mild calcification of the aortic valve. Aortic valve regurgitation is mild. No aortic stenosis is present.  Pulmonic Valve: The pulmonic valve was normal in structure. Pulmonic valve regurgitation is not visualized.  Aorta: The aortic root is normal in size and structure.  Venous: The inferior vena cava is normal in size with greater than 50% respiratory variability, suggesting right atrial pressure of 3 mmHg.  IAS/Shunts: No atrial level shunt detected by color flow Doppler.   LEFT VENTRICLE PLAX 2D LVIDd:         5.00 cm LVIDs:         4.10 cm LV PW:         0.80 cm LV IVS:        1.00 cm LVOT diam:     1.80 cm LV SV:         39 LV SV Index:   22 LVOT Area:     2.54 cm   RIGHT VENTRICLE          IVC RV Basal diam:  2.80 cm  IVC diam: 1.40 cm TAPSE (M-mode): 1.2 cm  LEFT ATRIUM             Index        RIGHT ATRIUM           Index LA diam:        4.80 cm 2.66 cm/m   RA Area:     19.20 cm LA Vol (A2C):   98.5 ml 54.61 ml/m  RA Volume:   51.00 ml  28.28 ml/m LA Vol (A4C):   80.6 ml 44.69 ml/m LA Biplane Vol: 88.9 ml  49.29 ml/m AORTIC VALVE LVOT Vmax:   77.30 cm/s LVOT Vmean:  51.700 cm/s LVOT VTI:    0.154 m  AORTA Ao Root diam: 3.20 cm Ao Asc diam:  3.40 cm  TRICUSPID VALVE TR Peak grad:   34.3 mmHg TR Vmax:        293.00 cm/s  SHUNTS Systemic VTI:  0.15 m Systemic Diam: 1.80 cm  Dalton McleanMD Electronically signed by Wilfred Lacy Signature Date/Time: 06/28/2022/4:21:44 PM    Final   TEE  ECHO TEE 06/19/2015  Narrative *[facility]* *[facility]* [address] [place], [place] [address] [phone]  ------------------------------------------------------------------- Transesophageal Echocardiography  Patient:    Mariska, Stennett MR #:       [id number] Study Date: 06/19/2015 Gender:     F Age:        30 Height:     165.1 cm Weight:     77.1 kg BSA:        1.9 m^2 Pt. Status: Room:       [id number]  Layla Maw, M.D. ADMITTING    Dietrich Pates, M.D. ATTENDING    Dietrich Pates, M.D. PERFORMING   Marca Ancona, M.D. SONOGRAPHER  Jimmy Reel, RDCS  cc:  ------------------------------------------------------------------- LV EF: 50%  ------------------------------------------------------------------- Indications:      Atrial fibrillation - 427.31.  ------------------------------------------------------------------- Study Conclusions  - Left ventricle: The cavity size was normal. Wall thickness was normal. The estimated ejection fraction was 50%. Diffuse hypokinesis. - Aortic valve: There was no stenosis. There was trivial regurgitation. - Aorta: Normal caliber thoracic aorta with minimal plaque. - Mitral valve: There was trivial regurgitation. - Left atrium: The atrium was moderately to severely dilated. No evidence of thrombus in the atrial cavity or appendage. - Right ventricle: The cavity size was normal. Systolic function was normal. - Right atrium: The atrium was moderately to severely dilated. - Atrial septum: No defect or  patent foramen ovale was identified. - Tricuspid valve: There was moderate regurgitation. Peak RV-RA gradient (S): 24 mm Hg.  Impressions:  - May proceed to DCCV.  Diagnostic transesophageal echocardiography.  2D and color Doppler. Birthdate:  Patient birthdate: 02-05-1931.  Age:  Patient is 89 yr old.  Sex:  Gender: female.    BMI: 28.3 kg/m^2.  Blood pressure: 97/64  Patient status:  Inpatient.  Study date:  Study date: 06/19/2015. Study time: 12:44 PM.  Location:  Endoscopy.  -------------------------------------------------------------------  ------------------------------------------------------------------- Left ventricle:  The cavity size was normal. Wall thickness was normal. The estimated ejection fraction was 50%. Diffuse hypokinesis.  ------------------------------------------------------------------- Aortic valve:   Trileaflet.  Doppler:   There was no stenosis. There was trivial regurgitation.  ------------------------------------------------------------------- Aorta:  Normal caliber thoracic aorta with minimal plaque.  ------------------------------------------------------------------- Mitral valve:   Doppler:   There was no evidence for stenosis. There was trivial regurgitation.  ------------------------------------------------------------------- Left atrium:  The atrium was moderately to severely dilated.  No evidence of thrombus in the atrial cavity or appendage.  ------------------------------------------------------------------- Atrial septum:  No defect or patent foramen ovale was identified.  ------------------------------------------------------------------- Right ventricle:  The cavity size was normal. Systolic function was normal.  ------------------------------------------------------------------- Pulmonic valve:    Structurally normal valve.   Cusp separation  was normal.  ------------------------------------------------------------------- Tricuspid valve:   Doppler:  There was moderate regurgitation.  ------------------------------------------------------------------- Right atrium:  The atrium was moderately to severely dilated.  ------------------------------------------------------------------- Pericardium:  There was no pericardial effusion.  ------------------------------------------------------------------- Post procedure conclusions Ascending Aorta:  - Normal caliber thoracic aorta with minimal plaque.  ------------------------------------------------------------------- Measurements  Tricuspid valve                     Value Tricuspid peak RV-RA gradient       21    mm Hg  Legend: (L)  and  (H)  mark values outside specified reference range.  ------------------------------------------------------------------- Prepared and Electronically Authenticated by  Marca Ancona, M.D. 2017-03-21T23:23:00     CARDIAC MRI  MR CARDIAC MORPHOLOGY W WO  CONTRAST 02/20/2013  Narrative CLINICAL DATA:  History of Cardiomyopathy Assess EF  EXAM: CARDIAC MRI  TECHNIQUE: The patient was scanned on a 1.5 Tesla GE magnet. A dedicated cardiac coil was used. Functional imaging was done using Fiesta sequences. 2,3, and 4 chamber views were done to assess for RWMA's. Modified Simpson's rule using a short axis stack was used to calculate an ejection fraction on a dedicated work [employer]. The patient received 20 cc of Multihance. After 10 minutes inversion recovery sequences were used to assess for infiltration and scar tissue.  CONTRAST:  20  FINDINGS: There was mild LAE. The LV was normal size and thickness. There was mildly abnormal septal motion. The RA and RV were normal. There was no ASD/VSD. There was no pericardial effusion. There was a prominent fat pad overlying the RV free wall. The Mitral valve was normal  with trivial MR. The aortic valve and tricuspid valves were normal.  The quantitative EF using short axis Simpsons was 57% (EDV 123cc ESV 52cc SV 70 cc)  Delayed enhancement images showed no scar or infiltration  IMPRESSION: 1) Normal LV size and function EF 57%  2) No hyperenhancement or scar tissue  3) Mild LAE  4) Trivial MR  Charlton Haws   Electronically Signed By: Charlton Haws M.D. On: 02/20/2013 17:20         Risk Assessment/Calculations:    CHA2DS2-VASc Score = 5   This indicates a 7.2% annual risk of stroke. The patient's score is based upon: CHF History: 1 HTN History: 1 Diabetes History: 0 Stroke History: 0 Vascular Disease History: 0 Age Score: 2 Gender Score: 1            Physical Exam:   VS:  BP 133/76   Pulse 89   Ht 5\' 5"  (1.651 m)   Wt 151 lb (68.5 kg)   BMI 25.13 kg/m    Wt Readings from Last 3 Encounters:  11/17/22 151 lb (68.5 kg)  11/15/22 150 lb (68 kg)  08/11/22 150 lb (68 kg)    GEN: Well nourished, well developed in no acute distress NECK: No JVD; No carotid bruits CARDIAC: IRIR, no murmurs, rubs, gallops RESPIRATORY:  Clear to auscultation without rales, wheezing or rhonchi  ABDOMEN: Soft, non-tender, non-distended EXTREMITIES:  No edema; No deformity   ASSESSMENT AND PLAN: .    HFrEF - overall euvolemic and well compensated on exam. Weight stable at home 149-150 lbs. Reiterated need for daily dosing of Torsemide 10mg . Adhering to <2L fluid intake. 11/15/22 ProBNP 877 which is improved from prior 04/22/22 2071. Did not tolerate RAAS nor SLGT2i limiting medical therapy.  Persistent atrial fibrillation / Hypercoagulable state / LBBB - Rate controlled today. Recent visit with PCP 11/15/22 atrial fib RVR 133 bpm at which time Toprol was increased from 100mg  to 150mg  daily. Noted today that she was supposed to be on 150mg , but her husband (who passed in May) was inadvertently only providing 100mg . She is concerned 150mg  dose is causing  fatigue. Discussed fatigue more likely related to deconditioning, atrial fib. She is agreeable to increase Metoprolol to 125mg  daily (one 100mg  tablet with one 25mg  tablet). Her daughter is managing her pills and notes she does have 50mg  tablet and will give one 100mg  and a half of a 50mg  tablet for 25mg  dose. Continue Elqiuis 2.5mg  BID, reduced dose due to age, weight. CHA2DS2-VASc Score = 5 [CHF History: 1, HTN History: 1, Diabetes History: 0, Stroke History: 0, Vascular Disease  History: 0, Age Score: 2, Gender Score: 1].  Therefore, the patient's annual risk of stroke is 7.2 %.    Denies bleeding complications, 11/15/22 stable CBC Hb 12.1. Continue Digoxin. 11/15/22 dig 0.8.   Physical deconditioning - Mechanical fall with ORIF tibula/fibula 06/2022. Has completed PT but not continuing exercises. Encouraged exercises while sitting and handout provided.       Dispo: follow up as scheduled  Signed, Alver Sorrow, NP

## 2022-11-17 NOTE — Patient Instructions (Addendum)
Medication Instructions:  Your physician has recommended you make the following change in your medication:  CHANGE Metoprolol to take one 100mg  tablet and one 25mg  together for a total dose of 125mg  daily  Keep up the great work taking Torsemide 10mg  daily.   *If you need a refill on your cardiac medications before your next appointment, please call your pharmacy*  Testing/Procedures: Your EKG today showed rate controlled atrial fibrillation.   Follow-Up: At [facility], you and your health needs are our priority.  As part of our continuing mission to provide you with exceptional heart care, we have created designated Provider Care Teams.  These Care Teams include your primary Cardiologist (physician) and Advanced Practice Providers (APPs -  Physician Assistants and Nurse Practitioners) who all work together to provide you with the care you need, when you need it.  We recommend signing up for the patient portal called "MyChart".  Sign up information is provided on this After Visit Summary.  MyChart is used to connect with patients for Virtual Visits (Telemedicine).  Patients are able to view lab/test results, encounter notes, upcoming appointments, etc.  Non-urgent messages can be sent to your provider as well.   To learn more about what you can do with MyChart, go to [email or web address].    Your next appointment:   As scheduled with Dr. Royann Shivers  Other Instructions  Pursed Lip Breathing Pursed lip breathing is a technique to relieve the feeling of being short of breath.  Being short of breath can make you tense and anxious. Before you start this breathing exercise, take a minute to relax your shoulders and close your eyes. Then: Start the exercise by closing your mouth. Breathe in through your nose, taking a normal breath. You can do this at your normal rate of breathing. If you feel you are not getting enough air, breathe in while slowly counting to 2 or 3. Pucker (purse)  your lips as if you were going to whistle. Gently tighten the muscles of your abdomenor press on your abdomen to help push the air out. Breathe out slowly through your pursed lips. Take at least twice as long to breathe out as it takes you to breathe in. Make sure that you breathe out all of the air, but do not force air out.  ___________________________________________  Exercises to do While Sitting  Warm-up Before starting other exercises: Sit up as straight as you can. Have your knees bent at 90 degrees, which is the shape of the capital letter "L." Keep your feet flat on the floor. Sit at the front edge of your chair, if you can. Pull in (tighten) the muscles in your abdomen and stretch your spine and neck as straight as you can. Hold this position for a few minutes. Breathe in and out evenly. Try to concentrate on your breathing, and relax your mind.  Stretching Exercise A: Arm stretch Hold your arms out straight in front of your body. Bend your hands at the wrist with your fingers pointing up, as if signaling someone to stop. Notice the slight tension in your forearms as you hold the position. Keeping your arms out and your hands bent, rotate your hands outward as far as you can and hold this stretch. Aim to have your thumbs pointing up and your pinkie fingers pointing down. Slowly repeat arm stretches for one minute as tolerated. Exercise B: Leg stretch If you can move your legs, try to "draw" letters on the floor with the toes of  your foot. Write your name with one foot. Write your name with the toes of your other foot. Slowly repeat the movements for one minute as tolerated. Exercise C: Reach for the sky Reach your hands as far over your head as you can to stretch your spine. Move your hands and arms as if you are climbing a rope. Slowly repeat the movements for one minute as tolerated.  Range of motion exercises Exercise A: Shoulder roll Let your arms hang loosely at your  sides. Lift just your shoulders up toward your ears, then let them relax back down. When your shoulders feel loose, rotate your shoulders in backward and forward circles. Do shoulder rolls slowly for one minute as tolerated. Exercise B: March in place As if you are marching, pump your arms and lift your legs up and down. Lift your knees as high as you can. If you are unable to lift your knees, just pump your arms and move your ankles and feet up and down. March in place for one minute as tolerated. Exercise C: Seated jumping jacks Let your arms hang down straight. Keeping your arms straight, lift them up over your head. Aim to point your fingers to the ceiling. While you lift your arms, straighten your legs and slide your heels along the floor to your sides, as wide as you can. As you bring your arms back down to your sides, slide your legs back together. If you are unable to use your legs, just move your arms. Slowly repeat seated jumping jacks for one minute as tolerated.  Strengthening exercises Exercise A: Shoulder squeeze Hold your arms straight out from your body to your sides, with your elbows bent and your fists pointed at the ceiling. Keeping your arms in the bent position, move them forward so your elbows and forearms meet in front of your face. Open your arms back out as wide as you can with your elbows still bent, until you feel your shoulder blades squeezing together. Hold for 5 seconds. Slowly repeat the movements forward and backward for one minute as tolerated.

## 2022-11-21 ENCOUNTER — Ambulatory Visit: Payer: Medicare Other | Admitting: Cardiovascular Disease

## 2022-11-22 ENCOUNTER — Encounter: Payer: Self-pay | Admitting: Cardiovascular Disease

## 2022-11-22 ENCOUNTER — Ambulatory Visit: Payer: Medicare Other | Admitting: Cardiovascular Disease

## 2022-11-22 VITALS — BP 122/72 | HR 72 | Ht 65.0 in | Wt 148.9 lb

## 2022-11-22 DIAGNOSIS — D6869 Other thrombophilia: Secondary | ICD-10-CM | POA: Diagnosis not present

## 2022-11-22 DIAGNOSIS — I4811 Longstanding persistent atrial fibrillation: Secondary | ICD-10-CM

## 2022-11-22 DIAGNOSIS — I5042 Chronic combined systolic (congestive) and diastolic (congestive) heart failure: Secondary | ICD-10-CM | POA: Diagnosis not present

## 2022-11-22 MED ORDER — METOPROLOL SUCCINATE ER 100 MG PO TB24: 150.0000 mg | ORAL_TABLET | Freq: Every day | ORAL | 3 refills | Status: DC

## 2022-11-22 MED ORDER — DIGOXIN 125 MCG PO TABS: 0.1250 mg | ORAL_TABLET | ORAL | 3 refills | Status: DC

## 2022-11-22 MED ORDER — DIGOXIN 125 MCG PO TABS: 0.1250 mg | ORAL_TABLET | ORAL | Status: DC

## 2022-11-22 NOTE — Progress Notes (Signed)
Cardiology Office Note:    Date:  11/22/2022   ID:  GATES HINGTGEN, DOB April 18, 1930, MRN [id number]  PCP:  [name], MD   [facility] Providers Cardiologist:  Thurmon Fair, MD     Referring MD: [name], MD   No chief complaint on file.    History of Present Illness:    CHARICE KAGLE is a 89 y.o. female with a history of longstanding persistent atrial fibrillation, chronic systolic heart failure since 2019 (EF dropped to 15% in setting of A. fib with RVR during hospital stay for hip fracture) with incomplete recovery (EF 35% in 2021, 25-30% by most recent echo in 06/24/2020), pulmonary artery hypertension (estimated systolic PAP 83 mm by echo in April 2021, with severe tricuspid regurgitation and abnormal ventricular septal flattening improved to systolic PAP 38 mmHg by echo with moderate TR in 06/24/2020).  She has severe biatrial dilation and chronic LBBB.  She has a history of spontaneous retroperitoneal bleed on warfarin many years ago.  She has a history of microscopic colitis.  Has rather fragile compensation of her congestive heart failure, but has not required hospitalization with heart failure since March 2022.    Her husband of 71 years recently passed away.  He was taking care of her medications.  When her daughter took over the role of setting up the medications, she noticed that Mrs. Muzquiz was getting a lower dose of metoprolol and prescribed (100 mg daily instead of 150 mg daily).  After increasing the metoprolol to 125 mg daily she has less fatigue and less shortness of breath.  Her edema has resolved after she started taking the torsemide on a daily basis again.  She has previously had decompensation when skipping the daily torsemide.  She seems to have a very narrow compensation range, goes into heart failure when she gains only 4-5 pounds.  The more range seems to be 145-150 pounds.  She has not tolerated treatment with RAAS inhibitors due to  variety of side effects including hypotension.  She has frequent urinary tract infections which make her a poor candidate for SGLT2 inhibitors.She has not had any bleeding on apixaban.  Rate control was achieved with a combination of digoxin and metoprolol.  She was intolerant to [place] which caused extreme weakness so that she could barely stand up. She has a history of cough with ACE inhibitors.  Losartan 25 mg was also started during her hospitalization in March 2022, but has since been discontinued due to fatigue and low blood pressure.   At one point, amiodarone was used for rate control but this was also stopped due to side effects.   her metoprolol dose was increased at her June appointment 250 mg daily and today rate control is good.  I do not think she has ever had a cardiac catheterization.  The nuclear stress test in 2019 when she presented with atrial fibrillation rapid ventricular response and severe reduction in LVEF showed a "potential subtle apical, distal septal and distal anteroseptal ischemia" with abnormalities on both rest and stress studies and septal dyskinesia with apex "nearly akinetic".  EF was 23%.  On my review, all these findings are readily explained by LBBB related artifact, especially in a patient with rapid rates.  Past Medical History:  Diagnosis Date   Acute combined systolic and diastolic heart failure (HCC) 05/02/2017   Anemia    Anxiety    Arthritis    "knees" (06/18/2015)   Atrial fibrillation with RVR (  HCC) 06/18/2015   h/o sig bleed on coumadin   Cardiogenic shock 05/02/2017   Complication of anesthesia 2010   "w/cardiac cath; got into a psychotic state for ~ 3 days; got me out w/Valium"   Depression    "I have it off and on; not as often as I've gotten older" (06/18/2015)   Diverticulosis    descending and sigmoid colon--Dr. Jarold Motto   DJD (degenerative joint disease) of knee    left   Dyspnea    with exertion and in morning mostly when wakes up    E  coli infection    History of   GERD (gastroesophageal reflux disease)    History of blood transfusion    History of Clostridium difficile colitis    IBS (irritable bowel syndrome)    Insomnia     off chronic ambien 5mg  as of 10/11, rare/episodic use since then  DCM/CHF   LBBB (left bundle branch block)    Lymphocytic colitis 09/27/2016   Moderate to severe pulmonary hypertension (HCC) 11/26/2019   Nonischemic cardiomyopathy (HCC)    normal coronaries, EF 15-20% 04/2008, EF 50-55% 2015   Osteoporosis    on DXA 05/2010   Positive PPD    Retroperitoneal bleed    Sciatica    Right   Urinary incontinence    Occassionally    Past Surgical History:  Procedure Laterality Date   ANTERIOR APPROACH HEMI HIP ARTHROPLASTY Left 08/05/2019   Procedure: ANTERIOR APPROACH HEMI HIP ARTHROPLASTY;  Surgeon: Jodi Geralds, MD;  Location: [facility] OR;  Service: Orthopedics;  Laterality: Left;   BIOPSY  11/22/2017   Procedure: BIOPSY;  Surgeon: Iva Boop, MD;  Location: [facility] ENDOSCOPY;  Service: Endoscopy;;   CARDIAC CATHETERIZATION  2010   CARDIOVERSION N/A 06/19/2015   Procedure: CARDIOVERSION;  Surgeon: Laurey Morale, MD;  Location: [facility] ENDOSCOPY;  Service: Cardiovascular;  Laterality: N/A;   CATARACT EXTRACTION W/ INTRAOCULAR LENS  IMPLANT, BILATERAL Bilateral    COLONOSCOPY WITH PROPOFOL N/A 11/22/2017   Procedure: COLONOSCOPY WITH PROPOFOL;  Surgeon: Iva Boop, MD;  Location: [facility] ENDOSCOPY;  Service: Endoscopy;  Laterality: N/A;   DILATION AND CURETTAGE OF UTERUS     ORIF ANKLE FRACTURE Right 09/22/2020   Procedure: RIGHT ANKLE OPEN REDUCTION INTERNAL FIXATION (ORIF);  Surgeon: [name], MD;  Location: [facility] ORS;  Service: Orthopedics;  Laterality: Right;   ORIF TIBIA PLATEAU Right 06/28/2022   Procedure: OPEN REDUCTION INTERNAL FIXATION (ORIF) TIBIA;  Surgeon: Roby Lofts, MD;  Location: [facility] OR;  Service: Orthopedics;  Laterality: Right;   TEE WITHOUT CARDIOVERSION N/A 06/19/2015    Procedure: TRANSESOPHAGEAL ECHOCARDIOGRAM (TEE);  Surgeon: Laurey Morale, MD;  Location: [facility] ENDOSCOPY;  Service: Cardiovascular;  Laterality: N/A;   TOTAL KNEE ARTHROPLASTY Left 04/25/2017   Procedure: TOTAL KNEE ARTHROPLASTY;  Surgeon: [name], MD;  Location: [facility] OR;  Service: Orthopedics;  Laterality: Left;   TOTAL KNEE ARTHROPLASTY Right 02/18/2020   Procedure: RIGHT TOTAL KNEE ARTHROPLASTY;  Surgeon: [name], MD;  Location: [facility] ORS;  Service: Orthopedics;  Laterality: Right;   TOTAL KNEE ARTHROPLASTY Right 06/16/2020   Procedure: RIGHT RETINACULAR REPAIR;  Surgeon: [name], MD;  Location: [facility] ORS;  Service: Orthopedics;  Laterality: Right;   TOTAL KNEE REVISION Left 12/03/2019   Procedure: LEFT TOTAL KNEE REVISION  OF POYLY EXCHANGE;  Surgeon: [name], MD;  Location: [facility] ORS;  Service: Orthopedics;  Laterality: Left;   VAGINAL HYSTERECTOMY  1979   ovaries intact    Current Medications:  Current Meds  Medication Sig   acetaminophen (TYLENOL) 500 MG tablet Take 2 tablets (1,000 mg total) by mouth every 8 (eight) hours as needed.   calcium carbonate (TUMS - DOSED IN MG ELEMENTAL CALCIUM) 500 MG chewable tablet Chew 500 mg by mouth 2 (two) times daily as needed for indigestion or heartburn.   cholecalciferol (VITAMIN D3) 25 MCG (1000 UNIT) tablet Take 1,000 Units by mouth in the morning.   ELIQUIS 2.5 MG TABS tablet TAKE 1 TABLET BY MOUTH TWICE A DAY   fluticasone (FLONASE) 50 MCG/ACT nasal spray SPRAY 2 SPRAYS INTO EACH NOSTRIL EVERY DAY   loperamide (IMODIUM) 2 MG capsule Take 2 mg by mouth 3 (three) times daily as needed for diarrhea or loose stools.    loratadine (CLARITIN) 10 MG tablet Take 10 mg by mouth daily.   pantoprazole (PROTONIX) 20 MG tablet TAKE 1 TABLET BY MOUTH EVERY DAY   potassium chloride SA (KLOR-CON M20) 20 MEQ tablet Take 2 tablets (40 mEq total) by mouth daily.   Povidone (IVIZIA DRY EYES OP) Apply 2 drops to eye Once PRN.   torsemide  (DEMADEX) 10 MG tablet Take 1 tablet (10 mg total) by mouth daily.   venlafaxine XR (EFFEXOR-XR) 37.5 MG 24 hr capsule TAKE 2 CAPSULES (75 MG TOTAL) BY MOUTH EVERY MORNING.   [DISCONTINUED] digoxin (LANOXIN) 0.125 MG tablet 1/4 tab by mouth daily. (Patient taking differently: 1/2 tab by mouth daily.)   [DISCONTINUED] digoxin (LANOXIN) 0.125 MG tablet Take 1 tablet (0.125 mg total) by mouth every Monday, Wednesday, and Friday.   [DISCONTINUED] metoprolol succinate (TOPROL XL) 25 MG 24 hr tablet Take one 25mg  tablet daily with 100mg  tablet daily for total daily dose of 125mg  daily.   [DISCONTINUED] metoprolol succinate (TOPROL-XL) 100 MG 24 hr tablet Take one 100mg  tablet with one 25mg  tablet for a total daily dose of 125mg  dose.   [DISCONTINUED] metoprolol succinate (TOPROL-XL) 100 MG 24 hr tablet Take 1.5 tablets (150 mg total) by mouth daily. Take with or immediately following a meal.     Allergies:   Ace inhibitors, Warfarin sodium, Famotidine, Vancomycin, Codeine, Delsym [dextromethorphan polistirex er], Lactose intolerance (gi), Lasix [furosemide], Phenylephrine, and Sulfamethoxazole-trimethoprim   Social History   Socioeconomic History   Marital status: Widowed    Spouse name: Dorene Sorrow    Number of children: 2   Years of education: 13   Highest education level: Not on file  Occupational History   Occupation: Retired    Associate Professor: RETIRED  Tobacco Use   Smoking status: Never   Smokeless tobacco: Never  Vaping Use   Vaping status: Never Used  Substance and Sexual Activity   Alcohol use: No    Alcohol/week: 0.0 standard drinks of alcohol   Drug use: No   Sexual activity: Not Currently    Birth control/protection: Post-menopausal, Surgical    Comment: Hysterectomy  Other Topics Concern   Not on file  Social History Narrative   Retired, former [employer]   Widowed 2024   Was married, 1953   Lives with husband   2 grown children, one in [place], one out of state   Tobacco  Use - No.    Drug Use - no   teaches Sunday school, reading   Social Determinants of Health   Financial Resource Strain: Low Risk  (07/12/2021)   Overall Financial Resource Strain (CARDIA)    Difficulty of Paying Living Expenses: Not hard at all  Food Insecurity: No Food Insecurity (08/11/2022)  Hunger Vital Sign    Worried About Running Out of Food in the Last Year: Never true    Ran Out of Food in the Last Year: Never true  Transportation Needs: No Transportation Needs (08/11/2022)   PRAPARE - Administrator, Civil Service (Medical): No    Lack of Transportation (Non-Medical): No  Physical Activity: Sufficiently Active (08/11/2022)   Exercise Vital Sign    Days of Exercise per Week: 7 days    Minutes of Exercise per Session: 30 min  Stress: No Stress Concern Present (08/11/2022)   [employer] of Occupational Health - Occupational Stress Questionnaire    Feeling of Stress : Not at all  Social Connections: Moderately Isolated (08/11/2022)   Social Connection and Isolation Panel [NHANES]    Frequency of Communication with Friends and Family: More than three times a week    Frequency of Social Gatherings with Friends and Family: More than three times a week    Attends Religious Services: Never    Database administrator or Organizations: No    Attends Banker Meetings: Never    Marital Status: Married     Family History: The patient's family history includes Colon cancer in her father; Other in her father; Tuberculosis in her mother. There is no history of Breast cancer, Stomach cancer, or Pancreatic cancer.  ROS:   Please see the history of present illness.     All other systems reviewed and are negative.  EKGs/Labs/Other Studies Reviewed:    The following studies were reviewed today: Cardiac MRI 2014  1) Normal LV size and function EF 57%   2) No hyperenhancement or scar tissue   3) Mild LAE   4) Trivial MR    Nuclear stress test  05/04/2017 IMPRESSION: 1. Potential subtle ischemia involving the left ventricular apex, distal septum and distal anteroseptal wall. Mildly diminished perfusion on both stress and rest studies of the distal anterior wall may be related to scar or breast soft tissue attenuation. 2. Severe wall motion abnormalities with septal dyskinesis, apical akinesis and otherwise global hypokinesis. The left ventricular cavity is significantly dilated. 3. Left ventricular ejection fraction: 23%.  4. Non invasive risk stratification*: High  Echo 08/05/19 1. Left ventricular ejection fraction, by estimation, is 35%. The left  ventricle has moderate to severely decreased function. The left ventricle  demonstrates global hypokinesis. There is moderate left ventricular  hypertrophy. Left ventricular diastolic  parameters are indeterminate. There is the interventricular septum is  flattened in diastole ('D' shaped left ventricle), consistent with right  ventricular volume overload.   2. Right ventricular systolic function is moderately reduced. The right  ventricular size is moderately enlarged. There is severely elevated  pulmonary artery systolic pressure. The estimated right ventricular  systolic pressure is 83.2 mmHg.   3. Left atrial size was mildly dilated.   4. Right atrial size was severely dilated.   5. The mitral valve is normal in structure. No evidence of mitral valve  regurgitation. No evidence of mitral stenosis.   6. The tricuspid valve is degenerative. Tricuspid valve regurgitation is  severe.   7. The aortic valve is grossly normal. Aortic valve regurgitation is not  visualized. No aortic stenosis is present.   8. The inferior vena cava is dilated in size with <50% respiratory  variability, suggesting right atrial pressure of 15 mmHg.   Echo 06/24/2020  1. Left ventricular ejection fraction, by estimation, is 25 to 30%. Left  ventricular ejection fraction  by 3D volume is 30 %. Left  ventricular  ejection fraction by 2D MOD biplane is 26.8 %. The left ventricle has  severely decreased function. The left  ventricle demonstrates global hypokinesis. Left ventricular diastolic  function could not be evaluated. The average left ventricular global  longitudinal strain is -7.9 %. The global longitudinal strain is abnormal.   2. Right ventricular systolic function is mildly reduced. The right  ventricular size is mildly enlarged. There is mildly elevated pulmonary  artery systolic pressure. The estimated right ventricular systolic  pressure is 38.3 mmHg.   3. Left atrial size was severely dilated.   4. Right atrial size was severely dilated.   5. The mitral valve is grossly normal. Mild mitral valve regurgitation.  No evidence of mitral stenosis.   6. Tricuspid valve regurgitation is moderate.   7. The aortic valve is tricuspid. There is mild calcification of the  aortic valve. There is mild thickening of the aortic valve. Aortic valve  regurgitation is mild. Mild aortic valve sclerosis is present, with no  evidence of aortic valve stenosis.   8. The inferior vena cava is normal in size with greater than 50%  respiratory variability, suggesting right atrial pressure of 3 mmHg.   Comparison(s): Changes from prior study are noted. LVEF now 25-30%. RV  size and function have improved. RVSP improved from prior.   EKG:  EKG is not ordered today.  05/12/2022 shows atrial fibrillation with controlled ventricular spots, left bundle branch block.  The QRS is very broad (measured by the computer at 140 ms, but on my evaluation closer to 170 ms).  Recent Labs: 06/17/2022: TSH 2.54 06/26/2022: B Natriuretic Peptide 490.6 07/07/2022: Magnesium 1.7 11/15/2022: ALT 11; BUN 23; Creatinine, Ser 0.95; Hemoglobin 12.1; Platelets 194.0; Potassium 4.9; Pro B Natriuretic peptide (BNP) 877.0; Sodium 132  Recent Lipid Panel    Component Value Date/Time   CHOL 144 05/04/2021 1330   CHOL 156  02/05/2018 0937   TRIG 143.0 05/04/2021 1330   HDL 60.50 05/04/2021 1330   HDL 58 02/05/2018 0937   CHOLHDL 2 05/04/2021 1330   VLDL 28.6 05/04/2021 1330   LDLCALC 54 05/04/2021 1330   LDLCALC 73 02/05/2018 0937   LDLDIRECT 58.0 12/27/2018 1657     Risk Assessment/Calculations:    CHA2DS2-VASc Score = 5  ?6 (+/- aortic atherosclerosis)  This indicates a 9.77.2% annual risk of stroke. The patient's score is based upon: Age 49, gender, CHF, HTN, vascular disease CHF History: 1 HTN History: 1 Diabetes History: 0 Stroke History: 0 Vascular Disease History: 0 Age Score: 2 Gender Score: 1           Physical Exam:    VS:  BP 122/72 (BP Location: Left Arm, Patient Position: Sitting, Cuff Size: Normal)   Pulse 72   Ht 5\' 5"  (1.651 m)   Wt 148 lb 14.4 oz (67.5 kg)   SpO2 92%   BMI 24.78 kg/m     Wt Readings from Last 3 Encounters:  11/22/22 148 lb 14.4 oz (67.5 kg)  11/17/22 151 lb (68.5 kg)  11/15/22 150 lb (68 kg)      General: Alert, oriented x3, no distress Head: no evidence of trauma, PERRL, EOMI, no exophtalmos or lid lag, no myxedema, no xanthelasma; normal ears, nose and oropharynx Neck: normal jugular venous pulsations and no hepatojugular reflux; brisk carotid pulses without delay and no carotid bruits Chest: clear to auscultation, no signs of consolidation by percussion or palpation, normal fremitus, symmetrical  and full respiratory excursions Cardiovascular: normal position and quality of the apical impulse, irregular rhythm, normal first and second heart sounds, faint holosystolic murmur at the left lower sternal border, no diastolic murmurs, rubs or gallops Abdomen: no tenderness or distention, no masses by palpation, no abnormal pulsatility or arterial bruits, normal bowel sounds, no hepatosplenomegaly Extremities: no clubbing, cyanosis or edema; 2+ radial, ulnar and brachial pulses bilaterally; 2+ right femoral, posterior tibial and dorsalis pedis pulses; 2+  left femoral, posterior tibial and dorsalis pedis pulses; no subclavian or femoral bruits Neurological: grossly nonfocal Psych: Normal mood and affect     ASSESSMENT:    1. Chronic combined systolic and diastolic congestive heart failure (HCC)   2. Longstanding persistent atrial fibrillation (HCC)   3. Acquired thrombophilia (HCC)       PLAN:    In order of problems listed above:  CHF: Today she appears euvolemic.  She does not have any ankle edema.  She bemoans the need to take diuretics every day, but historically has only done well when she takes the torsemide on a daily basis.  Discussed the importance of sodium restriction and daily weight monitoring.  Will try to keep her weight 145-150 pounds.  She has a rather narrow decompensation margin between euvolemia and heart failure.  Unfortunately, she has been intolerant of conventional heart failure medications for a variety of side effects.  Note that she had low normal or mildly reduced LVEF even before the events in 2019, with reduction in EF at least in part due to LBBB related dyssynchrony.  She has been intolerant of RAAS inhibitors (ACE inhibitor-related cough, losartan hypotension and fatigue, Farxiga fatigue weakness and urinary tract infections) and is unlikely to tolerate Entresto or spironolactone.  Note that pro BNP levels were normal in 2010-2013, but have been elevated since 2019.  There was a sharp increase in proBNP level from a mildly abnormal value of 284 in 2017 to 3961 in 2019 and 3972 in March 2022.  Most recently had a BNP level checked that was moderately elevated at 1400.  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 AFib: Good rate control today.  88 bpm by apical auscultation.  Increase the metoprolol to 150 mg daily.  Asked her to send [place] a log of blood pressure and heart rate in 2 weeks.  May choose to interrupt the dose of metoprolol to 200 mg daily and see if we can get  her off the digoxin.  Recently digoxin level was borderline high at 0.8.  The reduce the digoxin to 0.25 mg only 3 days a week (Monday Wednesday Friday). Anticoagulation: Painfully without bleeding or falls. Falls: No recent falls, but in the past she has had serious falls including hip fracture TR/PAH: As before, when she is well diuresed the TR murmur is much weaker. LBBB: Offered CRT, but she has declined invasive/surgical procedures. Abnormal nuclear myocardial perfusion study: On my review the abnormalities are readily explained by LBBB related artifact.  Cannot 100% exclude coronary disease. Aortic atherosclerosis: Seen on imaging studies.  Normal caliber of the aorta.  Lipid parameters in desirable range.           Medication Adjustments/Labs and Tests Ordered: Current medicines are reviewed at length with the patient today.  Concerns regarding medicines are outlined above.  No orders of the defined types were placed in this encounter.  Meds ordered this encounter  Medications   DISCONTD: digoxin (LANOXIN) 0.125 MG tablet  Sig: Take 1 tablet (0.125 mg total) by mouth every Monday, Wednesday, and Friday.    Dispense:  38 tablet    Refill:  3   DISCONTD: metoprolol succinate (TOPROL-XL) 100 MG 24 hr tablet    Sig: Take 1.5 tablets (150 mg total) by mouth daily. Take with or immediately following a meal.    Dispense:  135 tablet    Refill:  3   digoxin (LANOXIN) 0.125 MG tablet    Sig: Take 1 tablet (0.125 mg total) by mouth every Monday, Wednesday, and Friday.    This is a dose change. Pt will let you know when refill is needed   metoprolol succinate (TOPROL-XL) 100 MG 24 hr tablet    Sig: Take 1.5 tablets (150 mg total) by mouth daily. Take with or immediately following a meal.    Dispense:  135 tablet    Refill:  3    This is a dose change, the patient will let you know when a refill is needed.     Patient Instructions  Medication Instructions:  Increase Metoprolol  Succinate 150 mg daily Decrease Digoxin to 0.125 mg every Monday, Wednesday, and Friday *If you need a refill on your cardiac medications before your next appointment, please call your pharmacy*   Testing/Procedures: Keep a log of your BP and heart rate for 2 weeks and send in by MyChart   Follow-Up: At [facility], you and your health needs are our priority.  As part of our continuing mission to provide you with exceptional heart care, we have created designated Provider Care Teams.  These Care Teams include your primary Cardiologist (physician) and Advanced Practice Providers (APPs -  Physician Assistants and Nurse Practitioners) who all work together to provide you with the care you need, when you need it.  We recommend signing up for the patient portal called "MyChart".  Sign up information is provided on this After Visit Summary.  MyChart is used to connect with patients for Virtual Visits (Telemedicine).  Patients are able to view lab/test results, encounter notes, upcoming appointments, etc.  Non-urgent messages can be sent to your provider as well.   To learn more about what you can do with MyChart, go to [email or web address].    Your next appointment:    APP in 3 months  Dr Royann Shivers in 6 months   Signed, Thurmon Fair, MD  11/22/2022 2:47 PM    [facility]

## 2022-11-22 NOTE — Patient Instructions (Signed)
Medication Instructions:  Increase Metoprolol Succinate 150 mg daily Decrease Digoxin to 0.125 mg every Monday, Wednesday, and Friday *If you need a refill on your cardiac medications before your next appointment, please call your pharmacy*   Testing/Procedures: Keep a log of your BP and heart rate for 2 weeks and send in by MyChart   Follow-Up: At [facility], you and your health needs are our priority.  As part of our continuing mission to provide you with exceptional heart care, we have created designated Provider Care Teams.  These Care Teams include your primary Cardiologist (physician) and Advanced Practice Providers (APPs -  Physician Assistants and Nurse Practitioners) who all work together to provide you with the care you need, when you need it.  We recommend signing up for the patient portal called "MyChart".  Sign up information is provided on this After Visit Summary.  MyChart is used to connect with patients for Virtual Visits (Telemedicine).  Patients are able to view lab/test results, encounter notes, upcoming appointments, etc.  Non-urgent messages can be sent to your provider as well.   To learn more about what you can do with MyChart, go to [email or web address].    Your next appointment:    APP in 3 months  Dr Royann Shivers in 6 months

## 2022-11-24 DIAGNOSIS — K219 Gastro-esophageal reflux disease without esophagitis: Secondary | ICD-10-CM | POA: Diagnosis not present

## 2022-11-24 DIAGNOSIS — Z79899 Other long term (current) drug therapy: Secondary | ICD-10-CM | POA: Diagnosis not present

## 2022-11-24 DIAGNOSIS — S82201D Unspecified fracture of shaft of right tibia, subsequent encounter for closed fracture with routine healing: Secondary | ICD-10-CM | POA: Diagnosis not present

## 2022-11-24 DIAGNOSIS — M810 Age-related osteoporosis without current pathological fracture: Secondary | ICD-10-CM | POA: Diagnosis not present

## 2022-11-24 DIAGNOSIS — I110 Hypertensive heart disease with heart failure: Secondary | ICD-10-CM | POA: Diagnosis not present

## 2022-11-24 DIAGNOSIS — L89610 Pressure ulcer of right heel, unstageable: Secondary | ICD-10-CM | POA: Diagnosis not present

## 2022-11-24 DIAGNOSIS — I739 Peripheral vascular disease, unspecified: Secondary | ICD-10-CM | POA: Diagnosis not present

## 2022-11-24 DIAGNOSIS — I272 Pulmonary hypertension, unspecified: Secondary | ICD-10-CM | POA: Diagnosis not present

## 2022-11-24 DIAGNOSIS — I4821 Permanent atrial fibrillation: Secondary | ICD-10-CM | POA: Diagnosis not present

## 2022-11-24 DIAGNOSIS — W19XXXD Unspecified fall, subsequent encounter: Secondary | ICD-10-CM | POA: Diagnosis not present

## 2022-11-24 DIAGNOSIS — M1990 Unspecified osteoarthritis, unspecified site: Secondary | ICD-10-CM | POA: Diagnosis not present

## 2022-11-24 DIAGNOSIS — Z9181 History of falling: Secondary | ICD-10-CM | POA: Diagnosis not present

## 2022-11-24 DIAGNOSIS — I509 Heart failure, unspecified: Secondary | ICD-10-CM | POA: Diagnosis not present

## 2022-11-24 DIAGNOSIS — D649 Anemia, unspecified: Secondary | ICD-10-CM | POA: Diagnosis not present

## 2022-11-24 DIAGNOSIS — R251 Tremor, unspecified: Secondary | ICD-10-CM | POA: Diagnosis not present

## 2022-11-24 DIAGNOSIS — S82401D Unspecified fracture of shaft of right fibula, subsequent encounter for closed fracture with routine healing: Secondary | ICD-10-CM | POA: Diagnosis not present

## 2022-11-24 DIAGNOSIS — K589 Irritable bowel syndrome without diarrhea: Secondary | ICD-10-CM | POA: Diagnosis not present

## 2022-11-24 DIAGNOSIS — Z7901 Long term (current) use of anticoagulants: Secondary | ICD-10-CM | POA: Diagnosis not present

## 2022-12-06 DIAGNOSIS — H04123 Dry eye syndrome of bilateral lacrimal glands: Secondary | ICD-10-CM | POA: Diagnosis not present

## 2022-12-06 DIAGNOSIS — Z961 Presence of intraocular lens: Secondary | ICD-10-CM | POA: Diagnosis not present

## 2022-12-06 DIAGNOSIS — H35373 Puckering of macula, bilateral: Secondary | ICD-10-CM | POA: Diagnosis not present

## 2022-12-20 DIAGNOSIS — S82201K Unspecified fracture of shaft of right tibia, subsequent encounter for closed fracture with nonunion: Secondary | ICD-10-CM | POA: Diagnosis not present

## 2022-12-30 ENCOUNTER — Ambulatory Visit (INDEPENDENT_AMBULATORY_CARE_PROVIDER_SITE_OTHER): Payer: Medicare Other | Admitting: Family Medicine

## 2022-12-30 ENCOUNTER — Encounter: Payer: Self-pay | Admitting: Family Medicine

## 2022-12-30 VITALS — BP 126/80 | HR 70 | Temp 97.1°F | Ht 65.0 in | Wt 146.0 lb

## 2022-12-30 DIAGNOSIS — R059 Cough, unspecified: Secondary | ICD-10-CM | POA: Diagnosis not present

## 2022-12-30 NOTE — Progress Notes (Unsigned)
Cough and congestion for a few days.  No fevers.  Sputum was yellowish.  Prev had dyspnea on exertion, recently.  Fatigued.  Cough is better in the meantime.  Had used delsym in the meantime for cough, vs dayquil.    Using rollator at baseline.  She is using a knee brace.  Fx h/o noted.    Meds, vitals, and allergies reviewed.   ROS: Per HPI unless specifically indicated in ROS section   Nad Ncat Neck supple, no LA IRR Ctab No focal decrease in breath sounds.  No wheeze. No rhonchi. Abdomen soft. Extremities with trace edema. Right knee in brace. Skin well-perfused.

## 2022-12-30 NOTE — Patient Instructions (Signed)
Rest and fluids in the meantime.  Plain robitussin if needed.   Update update as needed.   Take care.  Glad to see you.

## 2022-12-31 ENCOUNTER — Encounter: Payer: Self-pay | Admitting: Family Medicine

## 2022-12-31 NOTE — Assessment & Plan Note (Signed)
Lungs are clear and cough has improved in the meantime.  Discussed options.  Likely that she had a self resolving viral process that would not need extra workup.  Would avoid dextromethorphan use. Rest and fluids in the meantime.  Plain robitussin if needed.   Update update as needed.   She agrees to plan.  Okay for outpatient follow-up.

## 2023-01-25 NOTE — Progress Notes (Unsigned)
Cardiology Office Note:    Date:  01/31/2023   ID:  [name], DOB 04-May-1930, MRN [id number]  PCP:  [name], MD   [facility] Providers Cardiologist:  Thurmon Fair, MD     Referring MD: [name], MD   Chief Complaint  Patient presents with   Shortness of Breath    History of Present Illness:    [name] is a 89 y.o. female with a hx of longstanding persistent atrial fibrillation, chronic systolic heart failure since 2019 with an EF as low as 15% in the setting of A-fib RVR, incomplete recovery with EF 35% in 2021, 25-30% in 2022, pulmonary artery hypertension with PAP 83 mm by echo 2021, severe tricuspid regurgitation and abnormal ventricular septal flattening improved to systolic PAP 38 mmHg by echo with moderate TR in 2022.  She has severe biatrial dilation and chronic LBBB.  She had a spontaneous retroperitoneal bleed on warfarin, remotely.  Per Dr. Royann Shivers, she has rather fragile compensation of her CHF.  She can have decompensation when skipping her daily torsemide or gaining only 4 to 5 pounds.  Dry weight felt to be 145-150 pounds.  Increasing metoprolol to 125 mg daily has improved her symptoms.  - No RAAS inhibition due to side effects including hypotension, cough with ACEi - Frequent UTIs preclude SGLT2i - Has done well on apixaban - Rate controlled with digoxin and metoprolol, amiodarone stopped due to side effects  Nuclear stress test in 2019 in the setting of A-fib RVR felt more consistent with LBBB related artifact.  She was last seen by Dr. Royann Shivers 11/22/2022. Digoxin level was 0.8 and was reduced to 0.125 mg MWF. She has been grieving her husband's death and has not continued PT exercises at home, likely deconditioned.   She presents for routine cardiology follow up. She is here with her daughter who she lives with. She states her Effexor isn't working and she needs something else. She is sedentary and does not want to resume  PT. She sometimes sits on the porch while family completes yard work. She continues to mourn the passing of her husband in May.   She denies cardiac symptoms. BP/HR log look great on days with and without digoxin.    Past Medical History:  Diagnosis Date   Acute combined systolic and diastolic heart failure (HCC) 05/02/2017   Anemia    Anxiety    Arthritis    "knees" (06/18/2015)   Atrial fibrillation with RVR (HCC) 06/18/2015   h/o sig bleed on coumadin   Cardiogenic shock 05/02/2017   Complication of anesthesia 2010   "w/cardiac cath; got into a psychotic state for ~ 3 days; got me out w/Valium"   Depression    "I have it off and on; not as often as I've gotten older" (06/18/2015)   Diverticulosis    descending and sigmoid colon--Dr. Jarold Motto   DJD (degenerative joint disease) of knee    left   Dyspnea    with exertion and in morning mostly when wakes up    E coli infection    History of   GERD (gastroesophageal reflux disease)    History of blood transfusion    History of Clostridium difficile colitis    IBS (irritable bowel syndrome)    Insomnia     off chronic ambien 5mg  as of 10/11, rare/episodic use since then  DCM/CHF   LBBB (left bundle branch block)    Lymphocytic colitis 09/27/2016   Moderate  to severe pulmonary hypertension (HCC) 11/26/2019   Nonischemic cardiomyopathy (HCC)    normal coronaries, EF 15-20% 04/2008, EF 50-55% 2015   Osteoporosis    on DXA 05/2010   Positive PPD    Retroperitoneal bleed    Sciatica    Right   Urinary incontinence    Occassionally    Past Surgical History:  Procedure Laterality Date   ANTERIOR APPROACH HEMI HIP ARTHROPLASTY Left 08/05/2019   Procedure: ANTERIOR APPROACH HEMI HIP ARTHROPLASTY;  Surgeon: Jodi Geralds, MD;  Location: [facility] OR;  Service: Orthopedics;  Laterality: Left;   BIOPSY  11/22/2017   Procedure: BIOPSY;  Surgeon: Iva Boop, MD;  Location: [facility] ENDOSCOPY;  Service: Endoscopy;;   CARDIAC CATHETERIZATION  2010    CARDIOVERSION N/A 06/19/2015   Procedure: CARDIOVERSION;  Surgeon: Laurey Morale, MD;  Location: [facility] ENDOSCOPY;  Service: Cardiovascular;  Laterality: N/A;   CATARACT EXTRACTION W/ INTRAOCULAR LENS  IMPLANT, BILATERAL Bilateral    COLONOSCOPY WITH PROPOFOL N/A 11/22/2017   Procedure: COLONOSCOPY WITH PROPOFOL;  Surgeon: Iva Boop, MD;  Location: [facility] ENDOSCOPY;  Service: Endoscopy;  Laterality: N/A;   DILATION AND CURETTAGE OF UTERUS     ORIF ANKLE FRACTURE Right 09/22/2020   Procedure: RIGHT ANKLE OPEN REDUCTION INTERNAL FIXATION (ORIF);  Surgeon: [name], MD;  Location: [facility] ORS;  Service: Orthopedics;  Laterality: Right;   ORIF TIBIA PLATEAU Right 06/28/2022   Procedure: OPEN REDUCTION INTERNAL FIXATION (ORIF) TIBIA;  Surgeon: Roby Lofts, MD;  Location: [facility] OR;  Service: Orthopedics;  Laterality: Right;   TEE WITHOUT CARDIOVERSION N/A 06/19/2015   Procedure: TRANSESOPHAGEAL ECHOCARDIOGRAM (TEE);  Surgeon: Laurey Morale, MD;  Location: [facility] ENDOSCOPY;  Service: Cardiovascular;  Laterality: N/A;   TOTAL KNEE ARTHROPLASTY Left 04/25/2017   Procedure: TOTAL KNEE ARTHROPLASTY;  Surgeon: [name], MD;  Location: [facility] OR;  Service: Orthopedics;  Laterality: Left;   TOTAL KNEE ARTHROPLASTY Right 02/18/2020   Procedure: RIGHT TOTAL KNEE ARTHROPLASTY;  Surgeon: [name], MD;  Location: [facility] ORS;  Service: Orthopedics;  Laterality: Right;   TOTAL KNEE ARTHROPLASTY Right 06/16/2020   Procedure: RIGHT RETINACULAR REPAIR;  Surgeon: [name], MD;  Location: [facility] ORS;  Service: Orthopedics;  Laterality: Right;   TOTAL KNEE REVISION Left 12/03/2019   Procedure: LEFT TOTAL KNEE REVISION  OF POYLY EXCHANGE;  Surgeon: [name], MD;  Location: [facility] ORS;  Service: Orthopedics;  Laterality: Left;   VAGINAL HYSTERECTOMY  1979   ovaries intact    Current Medications: Current Meds  Medication Sig   acetaminophen (TYLENOL) 500 MG tablet Take 2 tablets (1,000 mg total) by mouth every 8  (eight) hours as needed.   calcium carbonate (TUMS - DOSED IN MG ELEMENTAL CALCIUM) 500 MG chewable tablet Chew 500 mg by mouth 2 (two) times daily as needed for indigestion or heartburn.   cholecalciferol (VITAMIN D3) 25 MCG (1000 UNIT) tablet Take 1,000 Units by mouth in the morning.   digoxin (LANOXIN) 0.125 MG tablet Take 1 tablet (0.125 mg total) by mouth every Monday, Wednesday, and Friday.   ELIQUIS 2.5 MG TABS tablet TAKE 1 TABLET BY MOUTH TWICE A DAY   fluticasone (FLONASE) 50 MCG/ACT nasal spray SPRAY 2 SPRAYS INTO EACH NOSTRIL EVERY DAY   loperamide (IMODIUM) 2 MG capsule Take 2 mg by mouth 3 (three) times daily as needed for diarrhea or loose stools.    loratadine (CLARITIN) 10 MG tablet Take 10 mg by mouth daily.   metoprolol (TOPROL XL) 200 MG 24 hr  tablet Take 1 tablet (200 mg total) by mouth daily.   ondansetron (ZOFRAN) 4 MG tablet Take 1 tablet (4 mg total) by mouth every 8 (eight) hours as needed for nausea or vomiting.   pantoprazole (PROTONIX) 20 MG tablet TAKE 1 TABLET BY MOUTH EVERY DAY   potassium chloride SA (KLOR-CON M20) 20 MEQ tablet Take 2 tablets (40 mEq total) by mouth daily.   Povidone (IVIZIA DRY EYES OP) Apply 2 drops to eye Once PRN.   torsemide (DEMADEX) 10 MG tablet Take 1 tablet (10 mg total) by mouth daily.   traMADol (ULTRAM) 50 MG tablet TAKE 1 TABLET BY MOUTH 3 TIMES DAILY AS NEEDED.   venlafaxine XR (EFFEXOR-XR) 37.5 MG 24 hr capsule TAKE 2 CAPSULES (75 MG TOTAL) BY MOUTH EVERY MORNING.   [DISCONTINUED] metoprolol succinate (TOPROL-XL) 100 MG 24 hr tablet Take 1.5 tablets (150 mg total) by mouth daily. Take with or immediately following a meal.     Allergies:   Ace inhibitors, Warfarin sodium, Famotidine, Vancomycin, Codeine, Delsym [dextromethorphan polistirex er], Lactose intolerance (gi), Lasix [furosemide], Phenylephrine, and Sulfamethoxazole-trimethoprim   Social History   Socioeconomic History   Marital status: Widowed    Spouse name: Dorene Sorrow     Number of children: 2   Years of education: 13   Highest education level: Not on file  Occupational History   Occupation: Retired    Associate Professor: RETIRED  Tobacco Use   Smoking status: Never   Smokeless tobacco: Never  Vaping Use   Vaping status: Never Used  Substance and Sexual Activity   Alcohol use: No    Alcohol/week: 0.0 standard drinks of alcohol   Drug use: No   Sexual activity: Not Currently    Birth control/protection: Post-menopausal, Surgical    Comment: Hysterectomy  Other Topics Concern   Not on file  Social History Narrative   Retired, former [employer]   Widowed 2024   Was married, 1953   2 grown children, one in [place], one out of state   Tobacco Use - No.    Drug Use - no   teaches Sunday school, reading   Social Determinants of Health   Financial Resource Strain: Low Risk  (07/12/2021)   Overall Financial Resource Strain (CARDIA)    Difficulty of Paying Living Expenses: Not hard at all  Food Insecurity: No Food Insecurity (08/11/2022)   Hunger Vital Sign    Worried About Running Out of Food in the Last Year: Never true    Ran Out of Food in the Last Year: Never true  Transportation Needs: No Transportation Needs (08/11/2022)   PRAPARE - Administrator, Civil Service (Medical): No    Lack of Transportation (Non-Medical): No  Physical Activity: Sufficiently Active (08/11/2022)   Exercise Vital Sign    Days of Exercise per Week: 7 days    Minutes of Exercise per Session: 30 min  Stress: No Stress Concern Present (08/11/2022)   [employer] of Occupational Health - Occupational Stress Questionnaire    Feeling of Stress : Not at all  Social Connections: Moderately Isolated (08/11/2022)   Social Connection and Isolation Panel [NHANES]    Frequency of Communication with Friends and Family: More than three times a week    Frequency of Social Gatherings with Friends and Family: More than three times a week    Attends Religious Services: Never     Database administrator or Organizations: No    Attends Banker Meetings: Never  Marital Status: Married     Family History: The patient's family history includes Colon cancer in her father; Other in her father; Tuberculosis in her mother. There is no history of Breast cancer, Stomach cancer, or Pancreatic cancer.  ROS:   Please see the history of present illness.     All other systems reviewed and are negative.  EKGs/Labs/Other Studies Reviewed:    The following studies were reviewed today:  Echo 06/2022:  1. Left ventricular ejection fraction, by estimation, is 25 to 30%. The  left ventricle has severely decreased function. The left ventricle  demonstrates global hypokinesis with septal-lateral dyssynchrony  consistent with LBBB. Left ventricular diastolic  parameters are indeterminate.   2. Right ventricular systolic function is mildly reduced. The right  ventricular size is normal. There is mildly elevated pulmonary artery  systolic pressure. The estimated right ventricular systolic pressure is  37.3 mmHg.   3. Left atrial size was moderately dilated.   4. Right atrial size was mildly dilated.   5. The mitral valve is normal in structure. No evidence of mitral valve  regurgitation. No evidence of mitral stenosis.   6. The aortic valve is tricuspid. There is mild calcification of the  aortic valve. Aortic valve regurgitation is mild. No aortic stenosis is  present.   7. The inferior vena cava is normal in size with greater than 50%  respiratory variability, suggesting right atrial pressure of 3 mmHg.   8. The patient was in atrial fibrillation.   EKG Interpretation Date/Time:  Tuesday January 31 2023 15:40:31 EDT Ventricular Rate:  83 PR Interval:    QRS Duration:  142 QT Interval:  410 QTC Calculation: 481 R Axis:   241  Text Interpretation: Atrial fibrillation Right superior axis deviation Left bundle branch block When compared with ECG of  17-Nov-2022 14:34, QRS axis Shifted left Confirmed by Micah Flesher ([id number]) on 01/31/2023 3:47:14 PM    Recent Labs: 06/17/2022: TSH 2.54 06/26/2022: B Natriuretic Peptide 490.6 07/07/2022: Magnesium 1.7 11/15/2022: ALT 11; BUN 23; Creatinine, Ser 0.95; Hemoglobin 12.1; Platelets 194.0; Potassium 4.9; Pro B Natriuretic peptide (BNP) 877.0; Sodium 132  Recent Lipid Panel    Component Value Date/Time   CHOL 144 05/04/2021 1330   CHOL 156 02/05/2018 0937   TRIG 143.0 05/04/2021 1330   HDL 60.50 05/04/2021 1330   HDL 58 02/05/2018 0937   CHOLHDL 2 05/04/2021 1330   VLDL 28.6 05/04/2021 1330   LDLCALC 54 05/04/2021 1330   LDLCALC 73 02/05/2018 0937   LDLDIRECT 58.0 12/27/2018 1657     Risk Assessment/Calculations:    CHA2DS2-VASc Score = 5   This indicates a 7.2% annual risk of stroke. The patient's score is based upon: CHF History: 1 HTN History: 1 Diabetes History: 0 Stroke History: 0 Vascular Disease History: 0 Age Score: 2 Gender Score: 1            Physical Exam:    VS:  BP 112/60 (BP Location: Left Arm, Patient Position: Sitting, Cuff Size: Normal)   Pulse 84   Ht 5\' 5"  (1.651 m)   Wt 147 lb (66.7 kg) Comment: Pt checked weight at home 01/30/23  SpO2 91%   BMI 24.46 kg/m     Wt Readings from Last 3 Encounters:  01/31/23 147 lb (66.7 kg)  12/30/22 146 lb (66.2 kg)  11/22/22 148 lb 14.4 oz (67.5 kg)     GEN:  elderly female NAD HEENT: Normal NECK: No JVD; No carotid bruits LYMPHATICS: No lymphadenopathy CARDIAC:  irregular rhythm, regular rate RESPIRATORY:  Clear to auscultation without rales, wheezing or rhonchi  ABDOMEN: Soft, non-tender, non-distended MUSCULOSKELETAL:  B LE redness, pedal pulses not felt  SKIN: Warm and dry NEUROLOGIC:  Alert and oriented x 3 PSYCHIATRIC:  Normal affect   ASSESSMENT:    1. Nonischemic cardiomyopathy (HCC)   2. Chronic combined systolic and diastolic heart failure (HCC)   3. Permanent atrial fibrillation (HCC)   4.  LBBB (left bundle branch block)   5. Chronic anticoagulation   6. Essential hypertension   7. PAD (peripheral artery disease) (HCC)    PLAN:    In order of problems listed above:  Chronic systolic heart failure Pulmonary hypertension/TR - intolerant to conventional GDMT given side effects - low normal LVEF prior to 2019, may have been due to LBBB related dyssynchrony  - SE to ACEI (cough), losartan (hypotension, fatigue), SGLT2i (UTI, weakness)  - will avoid entresto and spironolactone - dry weight is 149-150 lbs - no S/S of CHF   Persistent atrial fibrillation LBBB - rate control with 150 mg toprol and digoxin - digoxin was reduced to 0.125 mg only 3 days per week MWF - per Dr. Erin Hearing plan - will stop digoxin and increase toprol to 200 mg - given depression and fatigue, can try to take toprol at night - she will take 100 mg BID tomorrow and then 200 mg at night the following night   Chronic anticoagulation  - eliquis 2.5 mg BID - no falls or bleeding   PVD - small vessel disease - small vessel disease per LE duplex - following with Dr. Kirke Corin who suggested follow up visit, but she broke her leg and they forgot - I have made an appt with him in [place] in Jan 2025 - pedal pulses not palpated, no wounds on feet, she denies pain   Follow up in 3 months. They will call with elevated HR or hypotension (HR over 100, SBP under 100).        Medication Adjustments/Labs and Tests Ordered: Current medicines are reviewed at length with the patient today.  Concerns regarding medicines are outlined above.  Orders Placed This Encounter  Procedures   EKG 12-Lead   Meds ordered this encounter  Medications   metoprolol (TOPROL XL) 200 MG 24 hr tablet    Sig: Take 1 tablet (200 mg total) by mouth daily.    Dispense:  90 tablet    Refill:  3    Patient Instructions  Medication Instructions:  Stop Digoxin Increase Metoprolol to 100 mg twice a day. Then increase again to  200 mg at nighttime. *If you need a refill on your cardiac medications before your next appointment, please call your pharmacy*   Lab Work: No labs   Testing/Procedures: No testing   Follow-Up: At [facility], you and your health needs are our priority.  As part of our continuing mission to provide you with exceptional heart care, we have created designated Provider Care Teams.  These Care Teams include your primary Cardiologist (physician) and Advanced Practice Providers (APPs -  Physician Assistants and Nurse Practitioners) who all work together to provide you with the care you need, when you need it.  We recommend signing up for the patient portal called "MyChart".  Sign up information is provided on this After Visit Summary.  MyChart is used to connect with patients for Virtual Visits (Telemedicine).  Patients are able to view lab/test results, encounter notes, upcoming appointments, etc.  Non-urgent messages  can be sent to your provider as well.   To learn more about what you can do with MyChart, go to [email or web address].    Your next appointment:   December 27th at 1:30 PM with Thurmon Fair, MD  January 16th at 1:20 PM with Lorine Bears, MD   Signed, Roe Rutherford Max[place]ys, [place]  01/31/2023 4:34 PM    [facility]

## 2023-01-27 ENCOUNTER — Other Ambulatory Visit: Payer: Self-pay | Admitting: Family Medicine

## 2023-01-31 ENCOUNTER — Ambulatory Visit: Payer: Medicare Other | Admitting: Adult Health

## 2023-01-31 ENCOUNTER — Ambulatory Visit: Payer: Medicare Other

## 2023-01-31 ENCOUNTER — Ambulatory Visit: Payer: Medicare Other | Attending: Adult Health | Admitting: Physician Assistant

## 2023-01-31 ENCOUNTER — Encounter: Payer: Self-pay | Admitting: Physician Assistant

## 2023-01-31 VITALS — BP 112/60 | HR 84 | Ht 65.0 in | Wt 147.0 lb

## 2023-01-31 DIAGNOSIS — Z7901 Long term (current) use of anticoagulants: Secondary | ICD-10-CM

## 2023-01-31 DIAGNOSIS — I739 Peripheral vascular disease, unspecified: Secondary | ICD-10-CM

## 2023-01-31 DIAGNOSIS — I10 Essential (primary) hypertension: Secondary | ICD-10-CM

## 2023-01-31 DIAGNOSIS — I428 Other cardiomyopathies: Secondary | ICD-10-CM

## 2023-01-31 DIAGNOSIS — I4821 Permanent atrial fibrillation: Secondary | ICD-10-CM

## 2023-01-31 DIAGNOSIS — I5042 Chronic combined systolic (congestive) and diastolic (congestive) heart failure: Secondary | ICD-10-CM | POA: Diagnosis not present

## 2023-01-31 DIAGNOSIS — I447 Left bundle-branch block, unspecified: Secondary | ICD-10-CM | POA: Diagnosis not present

## 2023-01-31 MED ORDER — METOPROLOL SUCCINATE ER 200 MG PO TB24
200.0000 mg | ORAL_TABLET | Freq: Every day | ORAL | 3 refills | Status: DC
Start: 2023-01-31 — End: 2023-08-22

## 2023-01-31 NOTE — Patient Instructions (Signed)
Medication Instructions:  Stop Digoxin Increase Metoprolol to 100 mg twice a day. Then increase again to 200 mg at nighttime. *If you need a refill on your cardiac medications before your next appointment, please call your pharmacy*   Lab Work: No labs   Testing/Procedures: No testing   Follow-Up: At [facility], you and your health needs are our priority.  As part of our continuing mission to provide you with exceptional heart care, we have created designated Provider Care Teams.  These Care Teams include your primary Cardiologist (physician) and Advanced Practice Providers (APPs -  Physician Assistants and Nurse Practitioners) who all work together to provide you with the care you need, when you need it.  We recommend signing up for the patient portal called "MyChart".  Sign up information is provided on this After Visit Summary.  MyChart is used to connect with patients for Virtual Visits (Telemedicine).  Patients are able to view lab/test results, encounter notes, upcoming appointments, etc.  Non-urgent messages can be sent to your provider as well.   To learn more about what you can do with MyChart, go to [email or web address].    Your next appointment:   December 27th at 1:30 PM with Thurmon Fair, MD  January 16th at 1:20 PM with Lorine Bears, MD

## 2023-02-01 ENCOUNTER — Ambulatory Visit (INDEPENDENT_AMBULATORY_CARE_PROVIDER_SITE_OTHER): Payer: Medicare Other

## 2023-02-01 DIAGNOSIS — Z23 Encounter for immunization: Secondary | ICD-10-CM | POA: Diagnosis not present

## 2023-02-10 ENCOUNTER — Encounter: Payer: Self-pay | Admitting: Family Medicine

## 2023-02-10 MED ORDER — FLUTICASONE PROPIONATE 50 MCG/ACT NA SUSP: 2.0000 | Freq: Every day | NASAL | 0 refills | Status: DC

## 2023-02-27 DIAGNOSIS — Z9842 Cataract extraction status, left eye: Secondary | ICD-10-CM | POA: Diagnosis not present

## 2023-02-27 DIAGNOSIS — H52223 Regular astigmatism, bilateral: Secondary | ICD-10-CM | POA: Diagnosis not present

## 2023-02-27 DIAGNOSIS — H1789 Other corneal scars and opacities: Secondary | ICD-10-CM | POA: Diagnosis not present

## 2023-02-27 DIAGNOSIS — Z9841 Cataract extraction status, right eye: Secondary | ICD-10-CM | POA: Diagnosis not present

## 2023-03-12 ENCOUNTER — Other Ambulatory Visit: Payer: Self-pay | Admitting: Family Medicine

## 2023-03-21 DIAGNOSIS — S82201K Unspecified fracture of shaft of right tibia, subsequent encounter for closed fracture with nonunion: Secondary | ICD-10-CM | POA: Diagnosis not present

## 2023-04-07 ENCOUNTER — Encounter: Payer: Self-pay | Admitting: Cardiovascular Disease

## 2023-04-07 ENCOUNTER — Ambulatory Visit: Payer: Medicare Other | Attending: Cardiovascular Disease | Admitting: Cardiovascular Disease

## 2023-04-07 VITALS — BP 100/67 | HR 89 | Ht 65.0 in | Wt 150.0 lb

## 2023-04-07 DIAGNOSIS — I447 Left bundle-branch block, unspecified: Secondary | ICD-10-CM

## 2023-04-07 DIAGNOSIS — I5022 Chronic systolic (congestive) heart failure: Secondary | ICD-10-CM

## 2023-04-07 DIAGNOSIS — I071 Rheumatic tricuspid insufficiency: Secondary | ICD-10-CM | POA: Diagnosis not present

## 2023-04-07 DIAGNOSIS — I700 Atherosclerosis of aorta: Secondary | ICD-10-CM

## 2023-04-07 DIAGNOSIS — I2721 Secondary pulmonary arterial hypertension: Secondary | ICD-10-CM

## 2023-04-07 DIAGNOSIS — D6869 Other thrombophilia: Secondary | ICD-10-CM

## 2023-04-07 DIAGNOSIS — I4821 Permanent atrial fibrillation: Secondary | ICD-10-CM

## 2023-04-07 NOTE — Progress Notes (Signed)
Cardiology Office Note:    Date:  04/07/2023   ID:  [name], DOB 1930/08/08, MRN [id number]  PCP:  [name], MD   [facility] Providers Cardiologist:  Thurmon Fair, MD     Referring MD: [name], MD   Chief Complaint  Patient presents with   Atrial Fibrillation     History of Present Illness:    [name] is a 89 y.o. female with a history of longstanding persistent atrial fibrillation, chronic systolic heart failure since 2019 (EF dropped to 15% in setting of A. fib with RVR during hospital stay for hip fracture) with incomplete recovery (EF 35% in 2021, 25-30% by most recent echo in 06/28/2022), pulmonary artery hypertension (estimated systolic PAP 83 mm by echo in April 2021, with severe tricuspid regurgitation and abnormal ventricular septal flattening improved to systolic PAP 38 mmHg by echo with moderate TR in 06/24/2020).  She has severe biatrial dilation and chronic LBBB.  She has a history of spontaneous retroperitoneal bleed on warfarin many years ago.  She has a history of microscopic colitis.  She had a fall in March 2024 complicated by right tibial comminuted fracture that required surgical repair.  Has rather fragile compensation of her congestive heart failure, but has not required hospitalization with heart failure since March 2022.   She seems to have a very narrow compensation range, goes into heart failure when she gains only 4-5 pounds.  The optimal range seems to be 145-150 pounds. Most of the time she takes just 10 mg of torsemide daily, but a couple of times a week she will take an extra 10 mg when she develops edema.  Her weight is consistently staying in the 145-150 pound range.  She has had some discoloration of both lower extremities and she had lower extremity Doppler studies 06/24/2022 that showed normal ABI.  She has not tolerated treatment with RAAS inhibitors due to variety of side effects including hypotension.  She has  frequent urinary tract infections which make her a poor candidate for SGLT2 inhibitors.She has not had any bleeding on apixaban.  Rate control was achieved with a combination of digoxin and metoprolol.  We decreased the dose of digoxin last year when she had a borderline toxic level, but she has never had clinical evidence of digoxin toxicity.  She was intolerant to [place] which caused extreme weakness so that she could barely stand up. She has a history of cough with ACE inhibitors.  Losartan 25 mg was also started during her hospitalization in March 2022, but has since been discontinued due to fatigue and low blood pressure.   At one point, amiodarone was used for rate control but this was also stopped due to side effects.   her metoprolol dose was increased at her June appointment 250 mg daily and today rate control is good.  I do not think she has ever had a cardiac catheterization.  The nuclear stress test in 2019 when she presented with atrial fibrillation rapid ventricular response and severe reduction in LVEF showed a "potential subtle apical, distal septal and distal anteroseptal ischemia" with abnormalities on both rest and stress studies and septal dyskinesia with apex "nearly akinetic".  EF was 23%.  On my review, all these findings are readily explained by LBBB related artifact, especially in a patient with rapid rates.  Past Medical History:  Diagnosis Date   Acute combined systolic and diastolic heart failure (HCC) 05/02/2017   Anemia    Anxiety  Arthritis    "knees" (06/18/2015)   Atrial fibrillation with RVR (HCC) 06/18/2015   h/o sig bleed on coumadin   Cardiogenic shock 05/02/2017   Complication of anesthesia 2010   "w/cardiac cath; got into a psychotic state for ~ 3 days; got me out w/Valium"   Depression    "I have it off and on; not as often as I've gotten older" (06/18/2015)   Diverticulosis    descending and sigmoid colon--Dr. Jarold Motto   DJD (degenerative joint disease)  of knee    left   Dyspnea    with exertion and in morning mostly when wakes up    E coli infection    History of   GERD (gastroesophageal reflux disease)    History of blood transfusion    History of Clostridium difficile colitis    IBS (irritable bowel syndrome)    Insomnia     off chronic ambien 5mg  as of 10/11, rare/episodic use since then  DCM/CHF   LBBB (left bundle branch block)    Lymphocytic colitis 09/27/2016   Moderate to severe pulmonary hypertension (HCC) 11/26/2019   Nonischemic cardiomyopathy (HCC)    normal coronaries, EF 15-20% 04/2008, EF 50-55% 2015   Osteoporosis    on DXA 05/2010   Positive PPD    Retroperitoneal bleed    Sciatica    Right   Urinary incontinence    Occassionally    Past Surgical History:  Procedure Laterality Date   ANTERIOR APPROACH HEMI HIP ARTHROPLASTY Left 08/05/2019   Procedure: ANTERIOR APPROACH HEMI HIP ARTHROPLASTY;  Surgeon: Jodi Geralds, MD;  Location: [facility] OR;  Service: Orthopedics;  Laterality: Left;   BIOPSY  11/22/2017   Procedure: BIOPSY;  Surgeon: Iva Boop, MD;  Location: [facility] ENDOSCOPY;  Service: Endoscopy;;   CARDIAC CATHETERIZATION  2010   CARDIOVERSION N/A 06/19/2015   Procedure: CARDIOVERSION;  Surgeon: Laurey Morale, MD;  Location: [facility] ENDOSCOPY;  Service: Cardiovascular;  Laterality: N/A;   CATARACT EXTRACTION W/ INTRAOCULAR LENS  IMPLANT, BILATERAL Bilateral    COLONOSCOPY WITH PROPOFOL N/A 11/22/2017   Procedure: COLONOSCOPY WITH PROPOFOL;  Surgeon: Iva Boop, MD;  Location: [facility] ENDOSCOPY;  Service: Endoscopy;  Laterality: N/A;   DILATION AND CURETTAGE OF UTERUS     ORIF ANKLE FRACTURE Right 09/22/2020   Procedure: RIGHT ANKLE OPEN REDUCTION INTERNAL FIXATION (ORIF);  Surgeon: [name], MD;  Location: [facility] ORS;  Service: Orthopedics;  Laterality: Right;   ORIF TIBIA PLATEAU Right 06/28/2022   Procedure: OPEN REDUCTION INTERNAL FIXATION (ORIF) TIBIA;  Surgeon: Roby Lofts, MD;  Location: [facility] OR;  Service:  Orthopedics;  Laterality: Right;   TEE WITHOUT CARDIOVERSION N/A 06/19/2015   Procedure: TRANSESOPHAGEAL ECHOCARDIOGRAM (TEE);  Surgeon: Laurey Morale, MD;  Location: [facility] ENDOSCOPY;  Service: Cardiovascular;  Laterality: N/A;   TOTAL KNEE ARTHROPLASTY Left 04/25/2017   Procedure: TOTAL KNEE ARTHROPLASTY;  Surgeon: [name], MD;  Location: [facility] OR;  Service: Orthopedics;  Laterality: Left;   TOTAL KNEE ARTHROPLASTY Right 02/18/2020   Procedure: RIGHT TOTAL KNEE ARTHROPLASTY;  Surgeon: [name], MD;  Location: [facility];  Service: Orthopedics;  Laterality: Right;   TOTAL KNEE ARTHROPLASTY Right 06/16/2020   Procedure: RIGHT RETINACULAR REPAIR;  Surgeon: [name], MD;  Location: [facility]S;  Service: Orthopedics;  Laterality: Right;   TOTAL KNEE REVISION Left 12/03/2019   Procedure: LEFT TOTAL KNEE REVISION  OF POYLY EXCHANGE;  Surgeon: [name], MD;  Location: [facility] ORS;  Service: Orthopedics;  Laterality: Left;   VAGINAL  HYSTERECTOMY  1979   ovaries intact    Current Medications: Current Meds  Medication Sig   acetaminophen (TYLENOL) 500 MG tablet Take 2 tablets (1,000 mg total) by mouth every 8 (eight) hours as needed.   cholecalciferol (VITAMIN D3) 25 MCG (1000 UNIT) tablet Take 1,000 Units by mouth in the morning.   digoxin (LANOXIN) 0.125 MG tablet Take 1 tablet (0.125 mg total) by mouth every Monday, Wednesday, and Friday.   ELIQUIS 2.5 MG TABS tablet TAKE 1 TABLET BY MOUTH TWICE A DAY   fluticasone (FLONASE) 50 MCG/ACT nasal spray Place 2 sprays into both nostrils daily.   loperamide (IMODIUM) 2 MG capsule Take 2 mg by mouth 3 (three) times daily as needed for diarrhea or loose stools.    loratadine (CLARITIN) 10 MG tablet Take 10 mg by mouth daily.   metoprolol (TOPROL XL) 200 MG 24 hr tablet Take 1 tablet (200 mg total) by mouth daily.   pantoprazole (PROTONIX) 20 MG tablet TAKE 1 TABLET BY MOUTH EVERY DAY   potassium chloride SA (KLOR-CON M20) 20 MEQ tablet Take 2  tablets (40 mEq total) by mouth daily.   Povidone (IVIZIA DRY EYES OP) Apply 2 drops to eye Once PRN.   torsemide (DEMADEX) 10 MG tablet Take 1 tablet (10 mg total) by mouth daily.   venlafaxine XR (EFFEXOR-XR) 37.5 MG 24 hr capsule TAKE 2 CAPSULES (75 MG TOTAL) BY MOUTH EVERY MORNING.     Allergies:   Ace inhibitors, Warfarin sodium, Famotidine, Vancomycin, Codeine, Delsym [dextromethorphan polistirex er], Lactose intolerance (gi), Lasix [furosemide], Phenylephrine, and Sulfamethoxazole-trimethoprim   Social History   Socioeconomic History   Marital status: Widowed    Spouse name: Dorene Sorrow    Number of children: 2   Years of education: 13   Highest education level: Not on file  Occupational History   Occupation: Retired    Associate Professor: RETIRED  Tobacco Use   Smoking status: Never   Smokeless tobacco: Never  Vaping Use   Vaping status: Never Used  Substance and Sexual Activity   Alcohol use: No    Alcohol/week: 0.0 standard drinks of alcohol   Drug use: No   Sexual activity: Not Currently    Birth control/protection: Post-menopausal, Surgical    Comment: Hysterectomy  Other Topics Concern   Not on file  Social History Narrative   Retired, former [employer]   Widowed 2024   Was married, 1953   2 grown children, one in [place], one out of state   Tobacco Use - No.    Drug Use - no   teaches Sunday school, reading   Social Drivers of Corporate investment banker Strain: Low Risk  (07/12/2021)   Overall Financial Resource Strain (CARDIA)    Difficulty of Paying Living Expenses: Not hard at all  Food Insecurity: No Food Insecurity (08/11/2022)   Hunger Vital Sign    Worried About Running Out of Food in the Last Year: Never true    Ran Out of Food in the Last Year: Never true  Transportation Needs: No Transportation Needs (08/11/2022)   PRAPARE - Administrator, Civil Service (Medical): No    Lack of Transportation (Non-Medical): No  Physical Activity:  Sufficiently Active (08/11/2022)   Exercise Vital Sign    Days of Exercise per Week: 7 days    Minutes of Exercise per Session: 30 min  Stress: No Stress Concern Present (08/11/2022)   [employer] of Occupational Health - Occupational Stress Questionnaire  Feeling of Stress : Not at all  Social Connections: Moderately Isolated (08/11/2022)   Social Connection and Isolation Panel [NHANES]    Frequency of Communication with Friends and Family: More than three times a week    Frequency of Social Gatherings with Friends and Family: More than three times a week    Attends Religious Services: Never    Database administrator or Organizations: No    Attends Banker Meetings: Never    Marital Status: Married     Family History: The patient's family history includes Colon cancer in her father; Other in her father; Tuberculosis in her mother. There is no history of Breast cancer, Stomach cancer, or Pancreatic cancer.  ROS:   Please see the history of present illness.     All other systems reviewed and are negative.  EKGs/Labs/Other Studies Reviewed:    The following studies were reviewed today: Cardiac MRI 2014  1) Normal LV size and function EF 57%   2) No hyperenhancement or scar tissue   3) Mild LAE   4) Trivial MR    Nuclear stress test 05/04/2017 IMPRESSION: 1. Potential subtle ischemia involving the left ventricular apex, distal septum and distal anteroseptal wall. Mildly diminished perfusion on both stress and rest studies of the distal anterior wall may be related to scar or breast soft tissue attenuation. 2. Severe wall motion abnormalities with septal dyskinesis, apical akinesis and otherwise global hypokinesis. The left ventricular cavity is significantly dilated. 3. Left ventricular ejection fraction: 23%.  4. Non invasive risk stratification*: High  Echo 08/05/19 1. Left ventricular ejection fraction, by estimation, is 35%. The left  ventricle has moderate  to severely decreased function. The left ventricle  demonstrates global hypokinesis. There is moderate left ventricular  hypertrophy. Left ventricular diastolic  parameters are indeterminate. There is the interventricular septum is  flattened in diastole ('D' shaped left ventricle), consistent with right  ventricular volume overload.   2. Right ventricular systolic function is moderately reduced. The right  ventricular size is moderately enlarged. There is severely elevated  pulmonary artery systolic pressure. The estimated right ventricular  systolic pressure is 83.2 mmHg.   3. Left atrial size was mildly dilated.   4. Right atrial size was severely dilated.   5. The mitral valve is normal in structure. No evidence of mitral valve  regurgitation. No evidence of mitral stenosis.   6. The tricuspid valve is degenerative. Tricuspid valve regurgitation is  severe.   7. The aortic valve is grossly normal. Aortic valve regurgitation is not  visualized. No aortic stenosis is present.   8. The inferior vena cava is dilated in size with <50% respiratory  variability, suggesting right atrial pressure of 15 mmHg.   Echo 06/24/2020  1. Left ventricular ejection fraction, by estimation, is 25 to 30%. Left  ventricular ejection fraction by 3D volume is 30 %. Left ventricular  ejection fraction by 2D MOD biplane is 26.8 %. The left ventricle has  severely decreased function. The left  ventricle demonstrates global hypokinesis. Left ventricular diastolic  function could not be evaluated. The average left ventricular global  longitudinal strain is -7.9 %. The global longitudinal strain is abnormal.   2. Right ventricular systolic function is mildly reduced. The right  ventricular size is mildly enlarged. There is mildly elevated pulmonary  artery systolic pressure. The estimated right ventricular systolic  pressure is 38.3 mmHg.   3. Left atrial size was severely dilated.   4. Right atrial size  was  severely dilated.   5. The mitral valve is grossly normal. Mild mitral valve regurgitation.  No evidence of mitral stenosis.   6. Tricuspid valve regurgitation is moderate.   7. The aortic valve is tricuspid. There is mild calcification of the  aortic valve. There is mild thickening of the aortic valve. Aortic valve  regurgitation is mild. Mild aortic valve sclerosis is present, with no  evidence of aortic valve stenosis.   8. The inferior vena cava is normal in size with greater than 50%  respiratory variability, suggesting right atrial pressure of 3 mmHg.   Comparison(s): Changes from prior study are noted. LVEF now 25-30%. RV  size and function have improved. RVSP improved from prior.   Echocardiogram 06/28/2022    1. Left ventricular ejection fraction, by estimation, is 25 to 30%. The  left ventricle has severely decreased function. The left ventricle  demonstrates global hypokinesis with septal-lateral dyssynchrony  consistent with LBBB. Left ventricular diastolic  parameters are indeterminate.   2. Right ventricular systolic function is mildly reduced. The right  ventricular size is normal. There is mildly elevated pulmonary artery  systolic pressure. The estimated right ventricular systolic pressure is  37.3 mmHg.   3. Left atrial size was moderately dilated.   4. Right atrial size was mildly dilated.   5. The mitral valve is normal in structure. No evidence of mitral valve  regurgitation. No evidence of mitral stenosis.   6. The aortic valve is tricuspid. There is mild calcification of the  aortic valve. Aortic valve regurgitation is mild. No aortic stenosis is  present.   7. The inferior vena cava is normal in size with greater than 50%  respiratory variability, suggesting right atrial pressure of 3 mmHg.   8. The patient was in atrial fibrillati   EKG:    EKG Interpretation Date/Time:  Friday April 07 2023 13:42:05 EST Ventricular Rate:  89 PR Interval:    QRS  Duration:  146 QT Interval:  402 QTC Calculation: 489 R Axis:   -73  Text Interpretation: Atrial fibrillation Left axis deviation Left bundle branch block When compared with ECG of 31-Jan-2023 15:40, QRS axis Shifted right Confirmed by Ronetta Molla ([id number]) on 04/07/2023 1:51:34 PM         Recent Labs: 06/17/2022: TSH 2.54 06/26/2022: B Natriuretic Peptide 490.6 07/07/2022: Magnesium 1.7 11/15/2022: ALT 11; BUN 23; Creatinine, Ser 0.95; Hemoglobin 12.1; Platelets 194.0; Potassium 4.9; Pro B Natriuretic peptide (BNP) 877.0; Sodium 132  Recent Lipid Panel    Component Value Date/Time   CHOL 144 05/04/2021 1330   CHOL 156 02/05/2018 0937   TRIG 143.0 05/04/2021 1330   HDL 60.50 05/04/2021 1330   HDL 58 02/05/2018 0937   CHOLHDL 2 05/04/2021 1330   VLDL 28.6 05/04/2021 1330   LDLCALC 54 05/04/2021 1330   LDLCALC 73 02/05/2018 0937   LDLDIRECT 58.0 12/27/2018 1657   11/15/2022 Hemoglobin 12.1, creatinine 0.95, potassium 4.9, ALT 11, BNP 877  Risk Assessment/Calculations:    CHA2DS2-VASc Score = 5  ?6 (+/- aortic atherosclerosis)  This indicates a 9.77.2% annual risk of stroke. The patient's score is based upon: Age 31, gender, CHF, HTN, vascular disease CHF History: 1 HTN History: 1 Diabetes History: 0 Stroke History: 0 Vascular Disease History: 0 Age Score: 2 Gender Score: 1           Physical Exam:    VS:  BP 100/67   Pulse 89   Ht 5\' 5"  (1.651 m)  Wt 150 lb (68 kg)   SpO2 94%   BMI 24.96 kg/m     Wt Readings from Last 3 Encounters:  04/07/23 150 lb (68 kg)  01/31/23 147 lb (66.7 kg)  12/30/22 146 lb (66.2 kg)      General: Alert, oriented x3, no distress Head: no evidence of trauma, PERRL, EOMI, no exophtalmos or lid lag, no myxedema, no xanthelasma; normal ears, nose and oropharynx Neck: normal jugular venous pulsations and no hepatojugular reflux; brisk carotid pulses without delay and no carotid bruits Chest: clear to auscultation, no signs of  consolidation by percussion or palpation, normal fremitus, symmetrical and full respiratory excursions Cardiovascular: normal position and quality of the apical impulse, irregular rhythm, normal first and second heart sounds, faint holosystolic murmur at the left lower sternal border, no diastolic murmurs, rubs or gallops Abdomen: no tenderness or distention, no masses by palpation, no abnormal pulsatility or arterial bruits, normal bowel sounds, no hepatosplenomegaly Extremities: no clubbing, cyanosis or edema; 2+ radial, ulnar and brachial pulses bilaterally; 2+ right femoral, posterior tibial and dorsalis pedis pulses; 2+ left femoral, posterior tibial and dorsalis pedis pulses; no subclavian or femoral bruits Neurological: grossly nonfocal Psych: Normal mood and affect     ASSESSMENT:    1. Chronic systolic heart failure (HCC)   2. Permanent atrial fibrillation (HCC)   3. Acquired thrombophilia (HCC)   4. PAH (pulmonary artery hypertension) (HCC)   5. Severe tricuspid regurgitation   6. LBBB (left bundle branch block)   7. Aortic atherosclerosis (HCC)       PLAN:    In order of problems listed above:  CHF: Clinically she appears to be euvolemic.  She clearly dislikes taking the diuretic, but she has only been well compensated when taking it at least once daily.  She has been successful in an effort to keep her weight 145-150 pounds.  She has a rather narrow decompensation margin between euvolemia and heart failure.  Unfortunately, she has been intolerant of conventional heart failure medications for a variety of side effects.  Note that she had low normal or mildly reduced LVEF even before the events in 2019, with reduction in EF at least in part due to LBBB related dyssynchrony.  She has been intolerant of RAAS inhibitors (ACE inhibitor-related cough, losartan caused hypotension and fatigue, Farxiga caused fatigue weakness and urinary tract infections) and is unlikely to tolerate  Entresto or spironolactone.  Note that pro BNP levels were normal in 2010-2013, but have been elevated since 2019.  There was a sharp increase in proBNP level from a mildly abnormal value of 284 in 2017 to 3961 in 2019 and 3972 in March 2022.  Recent BNP in August 2024 was lower at 877.  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  We have only relatively recently achieved satisfactory rate control. AFib: Adequate rate control on current medications.  Unable to increase the beta-blocker dose any further.  Her blood pressure is borderline low.  I do not think we can completely discontinue the digoxin.  She is now taking a low-dose of 0.125 mg only 3 days a week and the risk of toxicity is low. Anticoagulation: He had a serious fall a few months ago, but none since.  No overt bleeding problems. Falls: Over the last few years she has had serious falls including a hip fracture and a tibial fracture.  At this point, the benefit of anticoagulation still seems  to exceed the risk, but that balance may soon to be the other direction. TR/PAH: As before, when she is well diuresed the TR murmur is much weaker. LBBB: Offered CRT, but she has declined invasive/surgical procedures. Abnormal nuclear myocardial perfusion study: On my review the abnormalities are readily explained by LBBB related artifact.  Cannot 100% exclude coronary disease. Aortic atherosclerosis: Seen on imaging studies.  Normal caliber of the aorta.  Lipid parameters in desirable range.  LDL less than 55.           Medication Adjustments/Labs and Tests Ordered: Current medicines are reviewed at length with the patient today.  Concerns regarding medicines are outlined above.  Orders Placed This Encounter  Procedures   EKG 12-Lead   No orders of the defined types were placed in this encounter.    Patient Instructions  Medication Instructions:  No  changes *If you need a refill on your cardiac medications before your next appointment, please call your pharmacy*  Follow-Up: At [facility], you and your health needs are our priority.  As part of our continuing mission to provide you with exceptional heart care, we have created designated Provider Care Teams.  These Care Teams include your primary Cardiologist (physician) and Advanced Practice Providers (APPs -  Physician Assistants and Nurse Practitioners) who all work together to provide you with the care you need, when you need it.  We recommend signing up for the patient portal called "MyChart".  Sign up information is provided on this After Visit Summary.  MyChart is used to connect with patients for Virtual Visits (Telemedicine).  Patients are able to view lab/test results, encounter notes, upcoming appointments, etc.  Non-urgent messages can be sent to your provider as well.   To learn more about what you can do with MyChart, go to [email or web address].    Your next appointment:   6 month(s)  Provider:   Thurmon Fair, MD            Signed, Thurmon Fair, MD  04/07/2023 3:08 PM    [facility]

## 2023-04-07 NOTE — Patient Instructions (Signed)
Medication Instructions:  No changes *If you need a refill on your cardiac medications before your next appointment, please call your pharmacy*   Follow-Up: At [facility], you and your health needs are our priority.  As part of our continuing mission to provide you with exceptional heart care, we have created designated Provider Care Teams.  These Care Teams include your primary Cardiologist (physician) and Advanced Practice Providers (APPs -  Physician Assistants and Nurse Practitioners) who all work together to provide you with the care you need, when you need it.  We recommend signing up for the patient portal called "MyChart".  Sign up information is provided on this After Visit Summary.  MyChart is used to connect with patients for Virtual Visits (Telemedicine).  Patients are able to view lab/test results, encounter notes, upcoming appointments, etc.  Non-urgent messages can be sent to your provider as well.   To learn more about what you can do with MyChart, go to [email or web address].    Your next appointment:   6 month(s)  Provider:   Mihai Croitoru, MD     

## 2023-04-27 ENCOUNTER — Encounter: Payer: Self-pay | Admitting: Cardiovascular Disease

## 2023-04-27 ENCOUNTER — Ambulatory Visit: Payer: Medicare Other | Attending: Cardiovascular Disease | Admitting: Cardiovascular Disease

## 2023-04-27 VITALS — BP 89/60 | HR 107 | Ht 65.0 in | Wt 148.0 lb

## 2023-04-27 DIAGNOSIS — E785 Hyperlipidemia, unspecified: Secondary | ICD-10-CM

## 2023-04-27 DIAGNOSIS — I739 Peripheral vascular disease, unspecified: Secondary | ICD-10-CM | POA: Diagnosis not present

## 2023-04-27 DIAGNOSIS — I10 Essential (primary) hypertension: Secondary | ICD-10-CM | POA: Diagnosis not present

## 2023-04-27 DIAGNOSIS — I4821 Permanent atrial fibrillation: Secondary | ICD-10-CM

## 2023-04-27 NOTE — Patient Instructions (Signed)
Medication Instructions:  No changes *If you need a refill on your cardiac medications before your next appointment, please call your pharmacy*   Lab Work: None ordered If you have labs (blood work) drawn today and your tests are completely normal, you will receive your results only by: MyChart Message (if you have MyChart) OR A paper copy in the mail If you have any lab test that is abnormal or we need to change your treatment, we will call you to review the results.   Testing/Procedures: None ordered   Follow-Up: At [facility], you and your health needs are our priority.  As part of our continuing mission to provide you with exceptional heart care, we have created designated Provider Care Teams.  These Care Teams include your primary Cardiologist (physician) and Advanced Practice Providers (APPs -  Physician Assistants and Nurse Practitioners) who all work together to provide you with the care you need, when you need it.  We recommend signing up for the patient portal called "MyChart".  Sign up information is provided on this After Visit Summary.  MyChart is used to connect with patients for Virtual Visits (Telemedicine).  Patients are able to view lab/test results, encounter notes, upcoming appointments, etc.  Non-urgent messages can be sent to your provider as well.   To learn more about what you can do with MyChart, go to [email or web address].    Your next appointment:   As needed

## 2023-04-27 NOTE — Progress Notes (Signed)
Cardiology Office Note   Date:  04/27/2023   ID:  [name], DOB 1930/05/04, MRN [id number]  PCP:  [name], MD  Cardiologist:  Dr. Royann Shivers  Chief Complaint  Patient presents with   Follow-up    PVD c/o elevated BP and Elevated HR. Meds reviewed verbally with pt.      History of Present Illness: [name] is a 89 y.o. female who was referred by Dr. Para March for evaluation management of peripheral arterial disease. She has known history of persistent atrial fibrillation, chronic systolic heart failure, severe tricuspid regurgitation with pulmonary hypertension, spontaneous retroperitoneal bleed on warfarin therapy in the past and left bundle branch block.  She has prolonged history of cold feet, numbness and intermittent discoloration.  She has no lower extremity claudication but her mobility is overall limited and she uses a wheelchair frequently.  She did develop a small wound on the right heel months ago but that has healed completely.  She underwent lower extremity arterial Doppler in March 2024 which showed normal ABI and triphasic waveforms.  However, toe pressure was abnormal bilaterally.   Past Medical History:  Diagnosis Date   Acute combined systolic and diastolic heart failure (HCC) 05/02/2017   Anemia    Anxiety    Arthritis    "knees" (06/18/2015)   Atrial fibrillation with RVR (HCC) 06/18/2015   h/o sig bleed on coumadin   Cardiogenic shock 05/02/2017   Complication of anesthesia 2010   "w/cardiac cath; got into a psychotic state for ~ 3 days; got me out w/Valium"   Depression    "I have it off and on; not as often as I've gotten older" (06/18/2015)   Diverticulosis    descending and sigmoid colon--Dr. Jarold Motto   DJD (degenerative joint disease) of knee    left   Dyspnea    with exertion and in morning mostly when wakes up    E coli infection    History of   GERD (gastroesophageal reflux disease)    History of blood transfusion     History of Clostridium difficile colitis    IBS (irritable bowel syndrome)    Insomnia     off chronic ambien 5mg  as of 10/11, rare/episodic use since then  DCM/CHF   LBBB (left bundle branch block)    Lymphocytic colitis 09/27/2016   Moderate to severe pulmonary hypertension (HCC) 11/26/2019   Nonischemic cardiomyopathy (HCC)    normal coronaries, EF 15-20% 04/2008, EF 50-55% 2015   Osteoporosis    on DXA 05/2010   Positive PPD    Retroperitoneal bleed    Sciatica    Right   Urinary incontinence    Occassionally    Past Surgical History:  Procedure Laterality Date   ANTERIOR APPROACH HEMI HIP ARTHROPLASTY Left 08/05/2019   Procedure: ANTERIOR APPROACH HEMI HIP ARTHROPLASTY;  Surgeon: Jodi Geralds, MD;  Location: [facility] OR;  Service: Orthopedics;  Laterality: Left;   BIOPSY  11/22/2017   Procedure: BIOPSY;  Surgeon: Iva Boop, MD;  Location: [facility] ENDOSCOPY;  Service: Endoscopy;;   CARDIAC CATHETERIZATION  2010   CARDIOVERSION N/A 06/19/2015   Procedure: CARDIOVERSION;  Surgeon: Laurey Morale, MD;  Location: [facility] ENDOSCOPY;  Service: Cardiovascular;  Laterality: N/A;   CATARACT EXTRACTION W/ INTRAOCULAR LENS  IMPLANT, BILATERAL Bilateral    COLONOSCOPY WITH PROPOFOL N/A 11/22/2017   Procedure: COLONOSCOPY WITH PROPOFOL;  Surgeon: Iva Boop, MD;  Location: [facility] ENDOSCOPY;  Service: Endoscopy;  Laterality: N/A;  DILATION AND CURETTAGE OF UTERUS     ORIF ANKLE FRACTURE Right 09/22/2020   Procedure: RIGHT ANKLE OPEN REDUCTION INTERNAL FIXATION (ORIF);  Surgeon: [name], MD;  Location: [facility] ORS;  Service: Orthopedics;  Laterality: Right;   ORIF TIBIA PLATEAU Right 06/28/2022   Procedure: OPEN REDUCTION INTERNAL FIXATION (ORIF) TIBIA;  Surgeon: Roby Lofts, MD;  Location: [facility] OR;  Service: Orthopedics;  Laterality: Right;   TEE WITHOUT CARDIOVERSION N/A 06/19/2015   Procedure: TRANSESOPHAGEAL ECHOCARDIOGRAM (TEE);  Surgeon: Laurey Morale, MD;  Location: [facility] ENDOSCOPY;  Service:  Cardiovascular;  Laterality: N/A;   TOTAL KNEE ARTHROPLASTY Left 04/25/2017   Procedure: TOTAL KNEE ARTHROPLASTY;  Surgeon: [name], MD;  Location: [facility] OR;  Service: Orthopedics;  Laterality: Left;   TOTAL KNEE ARTHROPLASTY Right 02/18/2020   Procedure: RIGHT TOTAL KNEE ARTHROPLASTY;  Surgeon: [name], MD;  Location: [facility] ORS;  Service: Orthopedics;  Laterality: Right;   TOTAL KNEE ARTHROPLASTY Right 06/16/2020   Procedure: RIGHT RETINACULAR REPAIR;  Surgeon: [name], MD;  Location: [facility] ORS;  Service: Orthopedics;  Laterality: Right;   TOTAL KNEE REVISION Left 12/03/2019   Procedure: LEFT TOTAL KNEE REVISION  OF POYLY EXCHANGE;  Surgeon: [name], MD;  Location: [facility] ORS;  Service: Orthopedics;  Laterality: Left;   VAGINAL HYSTERECTOMY  1979   ovaries intact     Current Outpatient Medications  Medication Sig Dispense Refill   acetaminophen (TYLENOL) 500 MG tablet Take 2 tablets (1,000 mg total) by mouth every 8 (eight) hours as needed.     calcium carbonate (TUMS - DOSED IN MG ELEMENTAL CALCIUM) 500 MG chewable tablet Chew 500 mg by mouth 2 (two) times daily as needed for indigestion or heartburn.     cholecalciferol (VITAMIN D3) 25 MCG (1000 UNIT) tablet Take 1,000 Units by mouth in the morning.     digoxin (LANOXIN) 0.125 MG tablet Take 1 tablet (0.125 mg total) by mouth every Monday, Wednesday, and Friday.     diphenhydrAMINE HCl (BENADRYL PO) Take by mouth daily as needed.     ELIQUIS 2.5 MG TABS tablet TAKE 1 TABLET BY MOUTH TWICE A DAY 180 tablet 1   fluticasone (FLONASE) 50 MCG/ACT nasal spray Place 2 sprays into both nostrils daily. 48 mL 0   loperamide (IMODIUM) 2 MG capsule Take 2 mg by mouth 3 (three) times daily as needed for diarrhea or loose stools.      loratadine (CLARITIN) 10 MG tablet Take 10 mg by mouth daily.     metoprolol (TOPROL XL) 200 MG 24 hr tablet Take 1 tablet (200 mg total) by mouth daily. 90 tablet 3   ondansetron (ZOFRAN) 4 MG tablet Take  1 tablet (4 mg total) by mouth every 8 (eight) hours as needed for nausea or vomiting. 20 tablet 0   pantoprazole (PROTONIX) 20 MG tablet TAKE 1 TABLET BY MOUTH EVERY DAY 90 tablet 1   potassium chloride SA (KLOR-CON M20) 20 MEQ tablet Take 2 tablets (40 mEq total) by mouth daily. 180 tablet 1   Povidone (IVIZIA DRY EYES OP) Apply 2 drops to eye Once PRN.     torsemide (DEMADEX) 10 MG tablet Take 1 tablet (10 mg total) by mouth daily. 135 tablet 3   traMADol (ULTRAM) 50 MG tablet TAKE 1 TABLET BY MOUTH 3 TIMES DAILY AS NEEDED. 30 tablet 1   venlafaxine XR (EFFEXOR-XR) 37.5 MG 24 hr capsule TAKE 2 CAPSULES (75 MG TOTAL) BY MOUTH EVERY MORNING. 180 capsule 1   No  current facility-administered medications for this visit.    Allergies:   Ace inhibitors, Warfarin sodium, Famotidine, Vancomycin, Codeine, Delsym [dextromethorphan polistirex er], Lactose intolerance (gi), Lasix [furosemide], Phenylephrine, and Sulfamethoxazole-trimethoprim    Social History:  The patient  reports that she has never smoked. She has never used smokeless tobacco. She reports that she does not drink alcohol and does not use drugs.   Family History:  The patient's family history includes Colon cancer in her father; Other in her father; Tuberculosis in her mother.    ROS:  Please see the history of present illness.   Otherwise, review of systems are positive for none.   All other systems are reviewed and negative.    PHYSICAL EXAM: VS:  BP (!) 89/60 (BP Location: Left Arm, Patient Position: Sitting, Cuff Size: Normal)   Pulse (!) 107   Ht 5\' 5"  (1.651 m)   Wt 148 lb (67.1 kg)   SpO2 95%   BMI 24.63 kg/m  , BMI Body mass index is 24.63 kg/m. GEN: Well nourished, well developed, in no acute distress  HEENT: normal  Neck: no JVD, carotid bruits, or masses Cardiac: Irregularly irregular and mildly tachycardic; no murmurs, rubs, or gallops,no edema  Respiratory:  clear to auscultation bilaterally, normal work of  breathing GI: soft, nontender, nondistended, + BS MS: no deformity or atrophy  Skin: warm and dry, no rash Neuro:  Strength and sensation are intact Psych: euthymic mood, full affect Vascular: Distal pedal pulses are not palpable.  Both feet are very cold to touch with mild cyanosis.   EKG:  EKG is ordered today. The ekg ordered today demonstrates : Atrial fibrillation with rapid ventricular response Left axis deviation Non-specific intra-ventricular conduction block Minimal voltage criteria for LVH, may be normal variant ( Cornell product ) Cannot rule out Anterior infarct , age undetermined When compared with ECG of 07-Apr-2023 13:42, No significant change was found      Recent Labs: 06/17/2022: TSH 2.54 06/26/2022: B Natriuretic Peptide 490.6 07/07/2022: Magnesium 1.7 11/15/2022: ALT 11; BUN 23; Creatinine, Ser 0.95; Hemoglobin 12.1; Platelets 194.0; Potassium 4.9; Pro B Natriuretic peptide (BNP) 877.0; Sodium 132    Lipid Panel    Component Value Date/Time   CHOL 144 05/04/2021 1330   CHOL 156 02/05/2018 0937   TRIG 143.0 05/04/2021 1330   HDL 60.50 05/04/2021 1330   HDL 58 02/05/2018 0937   CHOLHDL 2 05/04/2021 1330   VLDL 28.6 05/04/2021 1330   LDLCALC 54 05/04/2021 1330   LDLCALC 73 02/05/2018 0937   LDLDIRECT 58.0 12/27/2018 1657      Wt Readings from Last 3 Encounters:  04/27/23 148 lb (67.1 kg)  04/07/23 150 lb (68 kg)  01/31/23 147 lb (66.7 kg)           No data to display            ASSESSMENT AND PLAN:  1.  Peripheral arterial disease: I do suspect that she has significant microvascular disease contributing to both feet being very cold with intermittent cyanosis.  Her distal pulses are also not palpable today could be related to relatively low blood pressure.  Regardless, she currently has no indication for revascularization as there is no convincing evidence of claudication and she has no ulceration. I asked her to keep her feet warm keep and  consider some limited exercises. Cilostazol is contraindicated in the setting of heart failure. She is already on anticoagulation with Eliquis which should help. Angiography and revascularization should be reserved  for critical limb ischemia considering her age and comorbidities.  2.  Chronic systolic heart failure: She appears to be euvolemic.  3.  Permanent atrial fibrillation: She is mildly tachycardic.  This could be related to recent use of decongestants.  I discussed with her the importance of avoiding this class of medication.  She is on anticoagulation with Eliquis.    Disposition:   FU with me as needed. Signed,  Lorine Bears, MD  04/27/2023 1:42 PM    [facility]

## 2023-04-28 ENCOUNTER — Inpatient Hospital Stay (HOSPITAL_COMMUNITY)
Admission: EM | Admit: 2023-04-28 | Discharge: 2023-05-02 | DRG: 291 | Disposition: A | Discharge status: Home Health | Payer: Medicare Other | Attending: Internal Medicine | Admitting: Internal Medicine

## 2023-04-28 ENCOUNTER — Ambulatory Visit: Payer: Self-pay | Admitting: Family Medicine

## 2023-04-28 ENCOUNTER — Encounter (HOSPITAL_COMMUNITY): Payer: Self-pay | Admitting: Emergency Medicine

## 2023-04-28 ENCOUNTER — Emergency Department (HOSPITAL_COMMUNITY): Payer: Medicare Other

## 2023-04-28 ENCOUNTER — Other Ambulatory Visit: Payer: Self-pay

## 2023-04-28 DIAGNOSIS — Z885 Allergy status to narcotic agent status: Secondary | ICD-10-CM | POA: Diagnosis not present

## 2023-04-28 DIAGNOSIS — J811 Chronic pulmonary edema: Secondary | ICD-10-CM | POA: Diagnosis not present

## 2023-04-28 DIAGNOSIS — R918 Other nonspecific abnormal finding of lung field: Secondary | ICD-10-CM | POA: Diagnosis not present

## 2023-04-28 DIAGNOSIS — M1990 Unspecified osteoarthritis, unspecified site: Secondary | ICD-10-CM | POA: Diagnosis present

## 2023-04-28 DIAGNOSIS — G4700 Insomnia, unspecified: Secondary | ICD-10-CM | POA: Diagnosis not present

## 2023-04-28 DIAGNOSIS — I4821 Permanent atrial fibrillation: Secondary | ICD-10-CM | POA: Diagnosis present

## 2023-04-28 DIAGNOSIS — F32A Depression, unspecified: Secondary | ICD-10-CM | POA: Diagnosis present

## 2023-04-28 DIAGNOSIS — Z961 Presence of intraocular lens: Secondary | ICD-10-CM | POA: Diagnosis present

## 2023-04-28 DIAGNOSIS — K219 Gastro-esophageal reflux disease without esophagitis: Secondary | ICD-10-CM | POA: Diagnosis not present

## 2023-04-28 DIAGNOSIS — Z882 Allergy status to sulfonamides status: Secondary | ICD-10-CM

## 2023-04-28 DIAGNOSIS — Z800 Family history of malignant neoplasm of digestive organs: Secondary | ICD-10-CM | POA: Diagnosis not present

## 2023-04-28 DIAGNOSIS — Z79899 Other long term (current) drug therapy: Secondary | ICD-10-CM | POA: Diagnosis not present

## 2023-04-28 DIAGNOSIS — Z96642 Presence of left artificial hip joint: Secondary | ICD-10-CM | POA: Diagnosis present

## 2023-04-28 DIAGNOSIS — E739 Lactose intolerance, unspecified: Secondary | ICD-10-CM | POA: Diagnosis not present

## 2023-04-28 DIAGNOSIS — Z1152 Encounter for screening for COVID-19: Secondary | ICD-10-CM | POA: Diagnosis not present

## 2023-04-28 DIAGNOSIS — I10 Essential (primary) hypertension: Secondary | ICD-10-CM | POA: Diagnosis present

## 2023-04-28 DIAGNOSIS — F419 Anxiety disorder, unspecified: Secondary | ICD-10-CM | POA: Diagnosis present

## 2023-04-28 DIAGNOSIS — I517 Cardiomegaly: Secondary | ICD-10-CM | POA: Diagnosis not present

## 2023-04-28 DIAGNOSIS — I509 Heart failure, unspecified: Secondary | ICD-10-CM | POA: Diagnosis not present

## 2023-04-28 DIAGNOSIS — Z7901 Long term (current) use of anticoagulants: Secondary | ICD-10-CM

## 2023-04-28 DIAGNOSIS — I5023 Acute on chronic systolic (congestive) heart failure: Secondary | ICD-10-CM | POA: Diagnosis not present

## 2023-04-28 DIAGNOSIS — I4891 Unspecified atrial fibrillation: Secondary | ICD-10-CM | POA: Diagnosis not present

## 2023-04-28 DIAGNOSIS — Z883 Allergy status to other anti-infective agents status: Secondary | ICD-10-CM | POA: Diagnosis not present

## 2023-04-28 DIAGNOSIS — I428 Other cardiomyopathies: Secondary | ICD-10-CM | POA: Diagnosis present

## 2023-04-28 DIAGNOSIS — Z881 Allergy status to other antibiotic agents status: Secondary | ICD-10-CM | POA: Diagnosis not present

## 2023-04-28 DIAGNOSIS — Z8619 Personal history of other infectious and parasitic diseases: Secondary | ICD-10-CM

## 2023-04-28 DIAGNOSIS — R54 Age-related physical debility: Secondary | ICD-10-CM | POA: Diagnosis present

## 2023-04-28 DIAGNOSIS — I447 Left bundle-branch block, unspecified: Secondary | ICD-10-CM | POA: Diagnosis present

## 2023-04-28 DIAGNOSIS — J189 Pneumonia, unspecified organism: Secondary | ICD-10-CM | POA: Diagnosis not present

## 2023-04-28 DIAGNOSIS — Z66 Do not resuscitate: Secondary | ICD-10-CM | POA: Diagnosis present

## 2023-04-28 DIAGNOSIS — I272 Pulmonary hypertension, unspecified: Secondary | ICD-10-CM | POA: Diagnosis not present

## 2023-04-28 DIAGNOSIS — I110 Hypertensive heart disease with heart failure: Principal | ICD-10-CM | POA: Diagnosis present

## 2023-04-28 DIAGNOSIS — J9600 Acute respiratory failure, unspecified whether with hypoxia or hypercapnia: Secondary | ICD-10-CM | POA: Diagnosis present

## 2023-04-28 DIAGNOSIS — R7989 Other specified abnormal findings of blood chemistry: Secondary | ICD-10-CM

## 2023-04-28 DIAGNOSIS — Z888 Allergy status to other drugs, medicaments and biological substances status: Secondary | ICD-10-CM

## 2023-04-28 DIAGNOSIS — Z96653 Presence of artificial knee joint, bilateral: Secondary | ICD-10-CM | POA: Diagnosis present

## 2023-04-28 DIAGNOSIS — J9601 Acute respiratory failure with hypoxia: Secondary | ICD-10-CM | POA: Diagnosis not present

## 2023-04-28 DIAGNOSIS — Z831 Family history of other infectious and parasitic diseases: Secondary | ICD-10-CM

## 2023-04-28 DIAGNOSIS — I700 Atherosclerosis of aorta: Secondary | ICD-10-CM | POA: Diagnosis not present

## 2023-04-28 DIAGNOSIS — Z90710 Acquired absence of both cervix and uterus: Secondary | ICD-10-CM

## 2023-04-28 LAB — RESP PANEL BY RT-PCR (RSV, FLU A&B, COVID)  RVPGX2
Influenza A by PCR: NEGATIVE
Influenza B by PCR: NEGATIVE
Resp Syncytial Virus by PCR: NEGATIVE
SARS Coronavirus 2 by RT PCR: NEGATIVE

## 2023-04-28 LAB — DIGOXIN LEVEL: Digoxin Level: <0.2 ng/mL — ABNORMAL LOW (ref 0.8–2.0)

## 2023-04-28 LAB — CBC
HCT: 39.5 % (ref 36.0–46.0)
Hemoglobin: 12.6 g/dL (ref 12.0–15.0)
MCH: 31.1 pg (ref 26.0–34.0)
MCHC: 31.9 g/dL (ref 30.0–36.0)
MCV: 97.5 fL (ref 80.0–100.0)
Platelets: 230 10*3/uL (ref 150–400)
RBC: 4.05 MIL/uL (ref 3.87–5.11)
RDW: 13.9 % (ref 11.5–15.5)
WBC: 6.5 10*3/uL (ref 4.0–10.5)
nRBC: 0 % (ref 0.0–0.2)

## 2023-04-28 LAB — RESPIRATORY PANEL BY PCR

## 2023-04-28 LAB — PROTIME-INR
INR: 1.6 — ABNORMAL HIGH (ref 0.8–1.2)
Prothrombin Time: 19 s — ABNORMAL HIGH (ref 11.4–15.2)

## 2023-04-28 LAB — COMPREHENSIVE METABOLIC PANEL
ALT: 20 U/L (ref 0–44)
AST: 27 U/L (ref 15–41)
Albumin: 3.5 g/dL (ref 3.5–5.0)
Alkaline Phosphatase: 92 U/L (ref 38–126)
Anion gap: 12 (ref 5–15)
BUN: 25 mg/dL — ABNORMAL HIGH (ref 8–23)
CO2: 24 mmol/L (ref 22–32)
Calcium: 9.4 mg/dL (ref 8.9–10.3)
Chloride: 99 mmol/L (ref 98–111)
Creatinine, Ser: 1.19 mg/dL — ABNORMAL HIGH (ref 0.44–1.00)
GFR, Estimated: 43 mL/min — ABNORMAL LOW (ref >60)
Glucose, Bld: 137 mg/dL — ABNORMAL HIGH (ref 70–99)
Potassium: 4.2 mmol/L (ref 3.5–5.1)
Sodium: 135 mmol/L (ref 135–145)
Total Bilirubin: 1.2 mg/dL (ref 0.0–1.2)
Total Protein: 6.5 g/dL (ref 6.5–8.1)

## 2023-04-28 LAB — TROPONIN I (HIGH SENSITIVITY)
Troponin I (High Sensitivity): 11 ng/L (ref <18)
Troponin I (High Sensitivity): 13 ng/L (ref <18)

## 2023-04-28 LAB — MAGNESIUM: Magnesium: 1.8 mg/dL (ref 1.7–2.4)

## 2023-04-28 LAB — BRAIN NATRIURETIC PEPTIDE: B Natriuretic Peptide: 2815.2 pg/mL — ABNORMAL HIGH (ref 0.0–100.0)

## 2023-04-28 MED ORDER — DILTIAZEM HCL-DEXTROSE 125-5 MG/125ML-% IV SOLN (PREMIX)
5.0000 mg/h | INTRAVENOUS | Status: DC
  Administered 2023-04-28: 5 mg/h via INTRAVENOUS
  Filled 2023-04-28: qty 125

## 2023-04-28 MED ORDER — SODIUM CHLORIDE 0.9% FLUSH
3.0000 mL | Freq: Two times a day (BID) | INTRAVENOUS | Status: DC
  Administered 2023-04-28 – 2023-05-02 (×8): 3 mL via INTRAVENOUS

## 2023-04-28 MED ORDER — TORSEMIDE 20 MG PO TABS
10.0000 mg | ORAL_TABLET | Freq: Every day | ORAL | Status: DC
  Filled 2023-04-28: qty 1

## 2023-04-28 MED ORDER — POLYETHYLENE GLYCOL 3350 17 G PO PACK
17.0000 g | PACK | Freq: Every day | ORAL | Status: DC | PRN
  Administered 2023-04-30: 17 g via ORAL
  Filled 2023-04-28: qty 1

## 2023-04-28 MED ORDER — DILTIAZEM LOAD VIA INFUSION
10.0000 mg | Freq: Once | INTRAVENOUS | Status: AC
  Administered 2023-04-28: 10 mg via INTRAVENOUS
  Filled 2023-04-28: qty 10

## 2023-04-28 MED ORDER — ACETAMINOPHEN 325 MG PO TABS
650.0000 mg | ORAL_TABLET | Freq: Four times a day (QID) | ORAL | Status: DC | PRN
  Administered 2023-04-28 – 2023-05-02 (×7): 650 mg via ORAL
  Filled 2023-04-28 (×7): qty 2

## 2023-04-28 MED ORDER — ACETAMINOPHEN 650 MG RE SUPP: 650.0000 mg | Freq: Four times a day (QID) | RECTAL | Status: DC | PRN

## 2023-04-28 MED ORDER — PANTOPRAZOLE SODIUM 20 MG PO TBEC
20.0000 mg | DELAYED_RELEASE_TABLET | Freq: Every day | ORAL | Status: DC
  Administered 2023-04-29 – 2023-05-02 (×4): 20 mg via ORAL
  Filled 2023-04-28 (×4): qty 1

## 2023-04-28 MED ORDER — SODIUM CHLORIDE 0.9 % IV SOLN
500.0000 mg | INTRAVENOUS | Status: AC
  Administered 2023-04-28 – 2023-04-30 (×3): 500 mg via INTRAVENOUS
  Filled 2023-04-28 (×3): qty 5

## 2023-04-28 MED ORDER — SODIUM CHLORIDE 0.9 % IV SOLN
1.0000 g | INTRAVENOUS | Status: DC
  Administered 2023-04-28 – 2023-04-30 (×3): 1 g via INTRAVENOUS
  Filled 2023-04-28 (×4): qty 10

## 2023-04-28 MED ORDER — APIXABAN 2.5 MG PO TABS
2.5000 mg | ORAL_TABLET | Freq: Two times a day (BID) | ORAL | Status: DC
  Administered 2023-04-28 – 2023-05-02 (×8): 2.5 mg via ORAL
  Filled 2023-04-28 (×8): qty 1

## 2023-04-28 NOTE — ED Provider Triage Note (Signed)
Emergency Medicine Provider Triage Evaluation Note  [name] , a 89 y.o. female  was evaluated in triage.  Pt complains of generalized weakness, shortness of breath and fatigue.  Has had congestion, cough and diarrhea as well.  Denies fevers or chills.  Is on Eliquis for A-fib has not missed any doses.  Used to take digoxin for A-fib but stopped in October.  Review of Systems  Positive: SOB, cough, weakness Negative: CP  Physical Exam  BP (!) 126/90 (BP Location: Right Arm)   Pulse (!) 144   Temp 98.1 F (36.7 C) (Oral)   Resp 16   Ht 5\' 5"  (1.651 m)   Wt 67.1 kg   SpO2 92%   BMI 24.63 kg/m  Gen:   Awake, no distress   Resp:  Normal effort  MSK:   Moves extremities without difficulty  Other:  Afib  Medical Decision Making  Medically screening exam initiated at 2:02 PM.  Appropriate orders placed.  [name] was informed that the remainder of the evaluation will be completed by another provider, this initial triage assessment does not replace that evaluation, and the importance of remaining in the ED until their evaluation is complete.     Smitty Knudsen, PA-C 04/28/23 1191

## 2023-04-28 NOTE — ED Provider Notes (Signed)
[name] EMERGENCY DEPARTMENT AT [facility] Provider Note   CSN: [id number] Arrival date & time: 04/28/23  1319     History  Chief Complaint  Patient presents with   Atrial Fibrillation   Shortness of Breath   Fatigue    [name] is a 89 y.o. female.   Atrial Fibrillation Associated symptoms include shortness of breath.  Shortness of Breath Patient with shortness of breath.  Fatigue.  Is had for last 2 days.  Has had some diarrhea and some cough.  Has a history of A-fib is on Eliquis.  Uses a wheelchair at baseline but much more fatigued and short of breath than her Catherine.    Past Medical History:  Diagnosis Date   Acute combined systolic and diastolic heart failure (HCC) 05/02/2017   Anemia    Anxiety    Arthritis    "knees" (06/18/2015)   Atrial fibrillation with RVR (HCC) 06/18/2015   h/o sig bleed on coumadin   Cardiogenic shock 05/02/2017   Complication of anesthesia 2010   "w/cardiac cath; got into a psychotic state for ~ 3 days; got me out w/Valium"   Depression    "I have it off and on; not as often as I've gotten older" (06/18/2015)   Diverticulosis    descending and sigmoid colon--Dr. Jarold Motto   DJD (degenerative joint disease) of knee    left   Dyspnea    with exertion and in morning mostly when wakes up    E coli infection    History of   GERD (gastroesophageal reflux disease)    History of blood transfusion    History of Clostridium difficile colitis    IBS (irritable bowel syndrome)    Insomnia     off chronic ambien 5mg  as of 10/11, rare/episodic use since then  DCM/CHF   LBBB (left bundle branch block)    Lymphocytic colitis 09/27/2016   Moderate to severe pulmonary hypertension (HCC) 11/26/2019   Nonischemic cardiomyopathy (HCC)    Catherine coronaries, EF 15-20% 04/2008, EF 50-55% 2015   Osteoporosis    on DXA 05/2010   Positive PPD    Retroperitoneal bleed    Sciatica    Right   Urinary incontinence    Occassionally     Home Medications Prior to Admission medications   Medication Sig Start Date End Date Taking? Authorizing Provider  acetaminophen (TYLENOL) 500 MG tablet Take 2 tablets (1,000 mg total) by mouth every 8 (eight) hours as needed. 07/07/22   [name], MD  calcium carbonate (TUMS - DOSED IN MG ELEMENTAL CALCIUM) 500 MG chewable tablet Chew 500 mg by mouth 2 (two) times daily as needed for indigestion or heartburn.    [provider]  cholecalciferol (VITAMIN D3) 25 MCG (1000 UNIT) tablet Take 1,000 Units by mouth in the morning.    [provider]  digoxin (LANOXIN) 0.125 MG tablet Take 1 tablet (0.125 mg total) by mouth every Monday, Wednesday, and Friday. 11/23/22   Croitoru, Mihai, MD  diphenhydrAMINE HCl (BENADRYL PO) Take by mouth daily as needed.    [provider]  ELIQUIS 2.5 MG TABS tablet TAKE 1 TABLET BY MOUTH TWICE A DAY 11/10/22   Croitoru, Mihai, MD  fluticasone (FLONASE) 50 MCG/ACT nasal spray Place 2 sprays into both nostrils daily. 02/10/23   [name], MD  loperamide (IMODIUM) 2 MG capsule Take 2 mg by mouth 3 (three) times daily as needed for diarrhea or loose stools.  [provider]  loratadine (CLARITIN) 10 MG tablet Take 10 mg by mouth daily.    [provider]  metoprolol (TOPROL XL) 200 MG 24 hr tablet Take 1 tablet (200 mg total) by mouth daily. 01/31/23   [facility]uke, Roe Rutherford, PA  ondansetron (ZOFRAN) 4 MG tablet Take 1 tablet (4 mg total) by mouth every 8 (eight) hours as needed for nausea or vomiting. 11/15/22   [name], MD  pantoprazole (PROTONIX) 20 MG tablet TAKE 1 TABLET BY MOUTH EVERY DAY 03/14/23   [name], MD  potassium chloride SA (KLOR-CON M20) 20 MEQ tablet Take 2 tablets (40 mEq total) by mouth daily. 09/20/22   Croitoru, Mihai, MD  Povidone (IVIZIA DRY EYES OP) Apply 2 drops to eye Once PRN.    [provider]  torsemide (DEMADEX) 10 MG tablet Take 1 tablet (10 mg total) by mouth  daily. 08/08/22   [name], MD  traMADol (ULTRAM) 50 MG tablet TAKE 1 TABLET BY MOUTH 3 TIMES DAILY AS NEEDED. 10/26/22   [name], MD  venlafaxine XR (EFFEXOR-XR) 37.5 MG 24 hr capsule TAKE 2 CAPSULES (75 MG TOTAL) BY MOUTH EVERY MORNING. 01/27/23   [name], MD      Allergies    Ace inhibitors, Warfarin sodium, Famotidine, Vancomycin, Codeine, Delsym [dextromethorphan polistirex er], Lactose intolerance (gi), Lasix [furosemide], Phenylephrine, and Sulfamethoxazole-trimethoprim    Review of Systems   Review of Systems  Respiratory:  Positive for shortness of breath.     Physical Exam Updated Vital Signs BP 119/82   Pulse (!) 140   Temp 98.1 F (36.7 C) (Oral)   Resp (!) 24   Ht 5\' 5"  (1.651 m)   Wt 67.1 kg   SpO2 95%   BMI 24.63 kg/m  Physical Exam Vitals reviewed.  Cardiovascular:     Rate and Rhythm: Tachycardia present. Rhythm irregular.  Pulmonary:     Comments: Harsh breath sounds particularly bases. Chest:     Chest wall: No tenderness.  Musculoskeletal:     Right lower leg: Edema present.     Left lower leg: Edema present.  Skin:    Capillary Refill: Capillary refill takes less than 2 seconds.  Neurological:     Mental Status: She is alert.     ED Results / Procedures / Treatments   Labs (all labs ordered are listed, but only abnormal results are displayed) Labs Reviewed  PROTIME-INR - Abnormal; Notable for the following components:      Result Value   Prothrombin Time 19.0 (*)    INR 1.6 (*)    All other components within Catherine limits  BRAIN NATRIURETIC PEPTIDE - Abnormal; Notable for the following components:   B Natriuretic Peptide 2,815.2 (*)    All other components within Catherine limits  COMPREHENSIVE METABOLIC PANEL - Abnormal; Notable for the following components:   Glucose, Bld 137 (*)    BUN 25 (*)    Creatinine, Ser 1.19 (*)    GFR, Estimated 43 (*)    All other components within Catherine limits  DIGOXIN LEVEL -  Abnormal; Notable for the following components:   Digoxin Level <0.2 (*)    All other components within Catherine limits  RESP PANEL BY RT-PCR (RSV, FLU A&B, COVID)  RVPGX2  CBC  MAGNESIUM  TROPONIN I (HIGH SENSITIVITY)  TROPONIN I (HIGH SENSITIVITY)    EKG EKG Interpretation Date/Time:  Friday April 28 2023 13:38:03 EST Ventricular Rate:  112 PR Interval:  QRS Duration:  154 QT Interval:  388 QTC Calculation: 529 R Axis:   263  Text Interpretation: Atrial fibrillation with rapid ventricular response Right superior axis deviation Non-specific intra-ventricular conduction block Minimal voltage criteria for LVH, may be Catherine variant ( Cornell product ) Abnormal ECG When compared with ECG of 27-Apr-2023 13:23, No significant change since last tracing Confirmed by Benjiman Core [phone]) on 04/28/2023 4:09:56 PM  Radiology DG Chest Port 1 View Result Date: 04/28/2023 CLINICAL DATA:  5107 Atrial fibrillation (HCC) 5107 EXAM: PORTABLE CHEST 1 VIEW COMPARISON:  06/26/2022 FINDINGS: Stable cardiomegaly. Aortic atherosclerosis. Coarsened interstitial markings with streaky interstitial opacities in the left mid to lower lung zones and at the right lung base. Possible small left pleural effusion. No pneumothorax. IMPRESSION: Coarsened interstitial markings with streaky interstitial opacities in the left mid to lower lung zones and at the right lung base. Findings may reflect atelectasis versus edema. Electronically Signed   By: Duanne Guess D.O.   On: 04/28/2023 17:34    Procedures Procedures    Medications Ordered in ED Medications  diltiazem (CARDIZEM) 1 mg/mL load via infusion 10 mg (has no administration in time range)    And  diltiazem (CARDIZEM) 125 mg in dextrose 5% 125 mL (1 mg/mL) infusion (has no administration in time range)    ED Course/ Medical Decision Making/ A&P                                 Medical Decision Making Amount and/or Complexity of Data  Reviewed Labs: ordered. Radiology: ordered.  Risk Prescription drug management.   Patient shortness of breath cough.  Fatigue.  Is had worse for last few days.  Seen by cardiology yesterday weight today is 67 kg which is relatively close to priors.  Found to be in atrial fibrillation with RVR.  History of chronic A-fib and is anticoagulated, however normally not in an RVR.  Rate was around 109 yesterday.  Now going faster.  BNP is elevated.  X-ray does show some mild volume overload.  Reviewed cardiology note.  With worsening shortness of breath and the A-fib will start on rate control medications.  IV Cardizem now.  Will admit to hospitalist.  CRITICAL CARE Performed by: Benjiman Core Total critical care time: 30 minutes Critical care time was exclusive of separately billable procedures and treating other patients. Critical care was necessary to treat or prevent imminent or life-threatening deterioration. Critical care was time spent personally by me on the following activities: development of treatment plan with patient and/or surrogate as well as nursing, discussions with consultants, evaluation of patient's response to treatment, examination of patient, obtaining history from patient or surrogate, ordering and performing treatments and interventions, ordering and review of laboratory studies, ordering and review of radiographic studies, pulse oximetry and re-evaluation of patient's condition.         Final Clinical Impression(s) / ED Diagnoses Final diagnoses:  Atrial fibrillation with RVR [facility])    Rx / DC Orders ED Discharge Orders     None         Benjiman Core, MD 04/28/23 1749

## 2023-04-28 NOTE — Assessment & Plan Note (Addendum)
Presented in RVR. Now stabilised with diltiazem infusion - c.w. same for now. Reason is likely pneumonia. Will resume home anticoaulation and rate control meds. Anticipate that iv diltiazem will get titrtated off.

## 2023-04-28 NOTE — Telephone Encounter (Signed)
Call was disconnected when being transferred to this RN. Made 3 attempts to call pt, left VM requesting she return call to office. Called Dtr [place] on [place], she advised pt was speaking to another triage nurse at this time and that is why she didn't answer. This RN will follow up to ensure she has been triaged.  Copied from CRM [id number]. Topic: Clinical - Red Word Triage >> Apr 28, 2023 12:02 PM [name] wrote: Red Word that prompted transfer to Nurse Triage: Pt heart rate 125 blood pressure 105/72, trouble breathing, congestion

## 2023-04-28 NOTE — Telephone Encounter (Signed)
Agree with Er eval. Thanks 

## 2023-04-28 NOTE — Assessment & Plan Note (Signed)
This is chronic . Last know EF is from march last year 30% or less. Currently appears slightly high vs euvolemic to me. C.w. chronic diuretic. Trend weight.

## 2023-04-28 NOTE — H&P (Signed)
History and Physical    Patient: [name] DOB: Apr 16, 1930 DOA: 04/28/2023 DOS: the patient was seen and examined on 04/28/2023 PCP: [name], MD  Patient coming from: Home  Chief Complaint:  Chief Complaint  Patient presents with   Atrial Fibrillation   Shortness of Breath   Fatigue   HPI: [name] is a 89 y.o. female with medical history significant of known afib . Knwn CHF. [place] uses walker at home, has unsteady gait with tendnecy of kness to give out.  Patient was in his general state of health till approximately 2 weeks ago.  Since then patient reports sensation of fatigue/generalized sensation of shortness of breath versus wheezing when she would get up and exert herself.  Since then the sensation has progressively gotten worse.  And patient came to the ER today because of marked sensation of fatigue, shortness of breath and wheezing.  Patient feels chills at home however denies fever.  Reports her weight has been stable.  Denies any increased leg swelling.  No chest pain or palpitations.  Patient also reports to me having 2-3 soft/formed BM a day for last 2 days. She has been nasues. No vomting. No cough  On presentation to the ER patient was noted to have RVR.  Patient has been started on diltiazem infusion.  Medical evaluation is sought.  There is no documentation of hypoxemia on room air.  Patient reports feeling much better now.  Currently asymptomatic at rest in bed. Review of Systems: As mentioned in the history of present illness. All other systems reviewed and are negative. Past Medical History:  Diagnosis Date   Acute combined systolic and diastolic heart failure (HCC) 05/02/2017   Anemia    Anxiety    Arthritis    "knees" (06/18/2015)   Atrial fibrillation with RVR (HCC) 06/18/2015   h/o sig bleed on coumadin   Cardiogenic shock 05/02/2017   Complication of anesthesia 2010   "w/cardiac cath; got into a psychotic state for ~ 3  days; got me out w/Valium"   Depression    "I have it off and on; not as often as I've gotten older" (06/18/2015)   Diverticulosis    descending and sigmoid colon--Dr. Jarold Motto   DJD (degenerative joint disease) of knee    left   Dyspnea    with exertion and in morning mostly when wakes up    E coli infection    History of   GERD (gastroesophageal reflux disease)    History of blood transfusion    History of Clostridium difficile colitis    IBS (irritable bowel syndrome)    Insomnia     off chronic ambien 5mg  as of 10/11, rare/episodic use since then  DCM/CHF   LBBB (left bundle branch block)    Lymphocytic colitis 09/27/2016   Moderate to severe pulmonary hypertension (HCC) 11/26/2019   Nonischemic cardiomyopathy (HCC)    normal coronaries, EF 15-20% 04/2008, EF 50-55% 2015   Osteoporosis    on DXA 05/2010   Positive PPD    Retroperitoneal bleed    Sciatica    Right   Urinary incontinence    Occassionally   Past Surgical History:  Procedure Laterality Date   ANTERIOR APPROACH HEMI HIP ARTHROPLASTY Left 08/05/2019   Procedure: ANTERIOR APPROACH HEMI HIP ARTHROPLASTY;  Surgeon: Jodi Geralds, MD;  Location: [facility] OR;  Service: Orthopedics;  Laterality: Left;   BIOPSY  11/22/2017   Procedure: BIOPSY;  Surgeon: Iva Boop, MD;  Location: [facility] ENDOSCOPY;  Service: Endoscopy;;   CARDIAC CATHETERIZATION  2010   CARDIOVERSION N/A 06/19/2015   Procedure: CARDIOVERSION;  Surgeon: Laurey Morale, MD;  Location: [facility] ENDOSCOPY;  Service: Cardiovascular;  Laterality: N/A;   CATARACT EXTRACTION W/ INTRAOCULAR LENS  IMPLANT, BILATERAL Bilateral    COLONOSCOPY WITH PROPOFOL N/A 11/22/2017   Procedure: COLONOSCOPY WITH PROPOFOL;  Surgeon: Iva Boop, MD;  Location: [facility] ENDOSCOPY;  Service: Endoscopy;  Laterality: N/A;   DILATION AND CURETTAGE OF UTERUS     ORIF ANKLE FRACTURE Right 09/22/2020   Procedure: RIGHT ANKLE OPEN REDUCTION INTERNAL FIXATION (ORIF);  Surgeon: [name], MD;   Location: [facility] ORS;  Service: Orthopedics;  Laterality: Right;   ORIF TIBIA PLATEAU Right 06/28/2022   Procedure: OPEN REDUCTION INTERNAL FIXATION (ORIF) TIBIA;  Surgeon: Roby Lofts, MD;  Location: [facility] OR;  Service: Orthopedics;  Laterality: Right;   TEE WITHOUT CARDIOVERSION N/A 06/19/2015   Procedure: TRANSESOPHAGEAL ECHOCARDIOGRAM (TEE);  Surgeon: Laurey Morale, MD;  Location: [facility] ENDOSCOPY;  Service: Cardiovascular;  Laterality: N/A;   TOTAL KNEE ARTHROPLASTY Left 04/25/2017   Procedure: TOTAL KNEE ARTHROPLASTY;  Surgeon: [name], MD;  Location: [facility] OR;  Service: Orthopedics;  Laterality: Left;   TOTAL KNEE ARTHROPLASTY Right 02/18/2020   Procedure: RIGHT TOTAL KNEE ARTHROPLASTY;  Surgeon: [name], MD;  Location: [facility];  Service: Orthopedics;  Laterality: Right;   TOTAL KNEE ARTHROPLASTY Right 06/16/2020   Procedure: RIGHT RETINACULAR REPAIR;  Surgeon: [name], MD;  Location: [facility];  Service: Orthopedics;  Laterality: Right;   TOTAL KNEE REVISION Left 12/03/2019   Procedure: LEFT TOTAL KNEE REVISION  OF POYLY EXCHANGE;  Surgeon: [name], MD;  Location: [facility] ORS;  Service: Orthopedics;  Laterality: Left;   VAGINAL HYSTERECTOMY  1979   ovaries intact   Social History:  reports that she has never smoked. She has never used smokeless tobacco. She reports that she does not drink alcohol and does not use drugs.  Allergies  Allergen Reactions   Ace Inhibitors Cough   Warfarin Sodium Other (See Comments)    DOSE RELATED PHARMACOLOGIC EFFECT "bleed out"   Famotidine Other (See Comments)    GI upset   Vancomycin Nausea Only   Codeine Other (See Comments)    sedation   Delsym [Dextromethorphan Polistirex Er] Other (See Comments)    dizziness   Lactose Intolerance (Gi) Diarrhea   Lasix [Furosemide] Diarrhea    Oral Lasix gives her diarrhea, tolerates IV Rx   Phenylephrine Palpitations and Other (See Comments)    Nasal spray- "likely increase in nasal congestion".     Sulfamethoxazole-Trimethoprim Nausea And Vomiting    GI intolerance.    Family History  Problem Relation Age of Onset   Colon cancer Father        possible colon cancer   Other Father        esophagus tumor?   Tuberculosis Mother    Breast cancer Neg Hx    Stomach cancer Neg Hx    Pancreatic cancer Neg Hx     Prior to Admission medications   Medication Sig Start Date End Date Taking? Authorizing Provider  acetaminophen (TYLENOL) 500 MG tablet Take 2 tablets (1,000 mg total) by mouth every 8 (eight) hours as needed. 07/07/22   [name], MD  calcium carbonate (TUMS - DOSED IN MG ELEMENTAL CALCIUM) 500 MG chewable tablet Chew 500 mg by mouth 2 (two) times daily as needed for indigestion or heartburn.    [provider]  cholecalciferol (VITAMIN D3) 25 MCG (1000 UNIT) tablet Take 1,000 Units by mouth in the morning.    [provider]  digoxin (LANOXIN) 0.125 MG tablet Take 1 tablet (0.125 mg total) by mouth every Monday, Wednesday, and Friday. 11/23/22   Croitoru, Mihai, MD  diphenhydrAMINE HCl (BENADRYL PO) Take by mouth daily as needed.    [provider]  ELIQUIS 2.5 MG TABS tablet TAKE 1 TABLET BY MOUTH TWICE A DAY 11/10/22   Croitoru, Mihai, MD  fluticasone (FLONASE) 50 MCG/ACT nasal spray Place 2 sprays into both nostrils daily. 02/10/23   [name], MD  loperamide (IMODIUM) 2 MG capsule Take 2 mg by mouth 3 (three) times daily as needed for diarrhea or loose stools.     [provider]  loratadine (CLARITIN) 10 MG tablet Take 10 mg by mouth daily.    [provider]  metoprolol (TOPROL XL) 200 MG 24 hr tablet Take 1 tablet (200 mg total) by mouth daily. 01/31/23   [facility], Roe Rutherford, PA  ondansetron (ZOFRAN) 4 MG tablet Take 1 tablet (4 mg total) by mouth every 8 (eight) hours as needed for nausea or vomiting. 11/15/22   [name], MD  pantoprazole (PROTONIX) 20 MG tablet TAKE 1 TABLET BY MOUTH EVERY DAY 03/14/23    [name], MD  potassium chloride SA (KLOR-CON M20) 20 MEQ tablet Take 2 tablets (40 mEq total) by mouth daily. 09/20/22   Croitoru, Mihai, MD  Povidone (IVIZIA DRY EYES OP) Apply 2 drops to eye Once PRN.    [provider]  torsemide (DEMADEX) 10 MG tablet Take 1 tablet (10 mg total) by mouth daily. 08/08/22   [name], MD  traMADol (ULTRAM) 50 MG tablet TAKE 1 TABLET BY MOUTH 3 TIMES DAILY AS NEEDED. 10/26/22   [name], MD  venlafaxine XR (EFFEXOR-XR) 37.5 MG 24 hr capsule TAKE 2 CAPSULES (75 MG TOTAL) BY MOUTH EVERY MORNING. 01/27/23   [name], MD    Physical Exam: Vitals:   04/28/23 1804 04/28/23 1830 04/28/23 1833 04/28/23 1845  BP:  111/68 111/68   Pulse:  83 70 63  Resp:  (!) 24 (!) 24 17  Temp: 98.3 F (36.8 C)  97.8 F (36.6 C)   TempSrc: Oral  Oral   SpO2:  94% 94% 95%  Weight:      Height:       General - AAOx3 no distress. Coherent Resp - there are diffuse crackles fine especitally in left ung posteriorly upto suprascapular area. Cvs s1s2 normal irregular systolic murmur. Abd - soft non tender Extemrity warm no edema. No focal deficit.  Data Reviewed:  Labs on Admission:  Results for orders placed or performed during the hospital encounter of 04/28/23 (from the past 24 hours)  CBC     Status: None   Collection Time: 04/28/23  1:45 PM  Result Value Ref Range   WBC 6.5 4.0 - 10.5 K/uL   RBC 4.05 3.87 - 5.11 MIL/uL   Hemoglobin 12.6 12.0 - 15.0 g/dL   HCT 33.2 95.1 - 88.4 %   MCV 97.5 80.0 - 100.0 fL   MCH 31.1 26.0 - 34.0 pg   MCHC 31.9 30.0 - 36.0 g/dL   RDW 16.6 06.3 - 01.6 %   Platelets 230 150 - 400 K/uL   nRBC 0.0 0.0 - 0.2 %  Protime-INR- (order if Patient is taking Coumadin / Warfarin)     Status:  Abnormal   Collection Time: 04/28/23  1:45 PM  Result Value Ref Range   Prothrombin Time 19.0 (H) 11.4 - 15.2 seconds   INR 1.6 (H) 0.8 - 1.2  Troponin I (High Sensitivity)     Status: None   Collection Time: 04/28/23   1:45 PM  Result Value Ref Range   Troponin I (High Sensitivity) 11 <18 ng/L  Resp panel by RT-PCR (RSV, Flu A&B, Covid) Anterior Nasal Swab     Status: None   Collection Time: 04/28/23  2:12 PM   Specimen: Anterior Nasal Swab  Result Value Ref Range   SARS Coronavirus 2 by RT PCR NEGATIVE NEGATIVE   Influenza A by PCR NEGATIVE NEGATIVE   Influenza B by PCR NEGATIVE NEGATIVE   Resp Syncytial Virus by PCR NEGATIVE NEGATIVE  Brain natriuretic peptide     Status: Abnormal   Collection Time: 04/28/23  2:21 PM  Result Value Ref Range   B Natriuretic Peptide 2,815.2 (H) 0.0 - 100.0 pg/mL  Comprehensive metabolic panel     Status: Abnormal   Collection Time: 04/28/23  2:21 PM  Result Value Ref Range   Sodium 135 135 - 145 mmol/L   Potassium 4.2 3.5 - 5.1 mmol/L   Chloride 99 98 - 111 mmol/L   CO2 24 22 - 32 mmol/L   Glucose, Bld 137 (H) 70 - 99 mg/dL   BUN 25 (H) 8 - 23 mg/dL   Creatinine, Ser 2.44 (H) 0.44 - 1.00 mg/dL   Calcium 9.4 8.9 - 01.0 mg/dL   Total Protein 6.5 6.5 - 8.1 g/dL   Albumin 3.5 3.5 - 5.0 g/dL   AST 27 15 - 41 U/L   ALT 20 0 - 44 U/L   Alkaline Phosphatase 92 38 - 126 U/L   Total Bilirubin 1.2 0.0 - 1.2 mg/dL   GFR, Estimated 43 (L) >60 mL/min   Anion gap 12 5 - 15  Magnesium     Status: None   Collection Time: 04/28/23  2:21 PM  Result Value Ref Range   Magnesium 1.8 1.7 - 2.4 mg/dL  Troponin I (High Sensitivity)     Status: None   Collection Time: 04/28/23  4:15 PM  Result Value Ref Range   Troponin I (High Sensitivity) 13 <18 ng/L  Digoxin level     Status: Abnormal   Collection Time: 04/28/23  4:15 PM  Result Value Ref Range   Digoxin Level <0.2 (L) 0.8 - 2.0 ng/mL   Basic Metabolic Panel: Recent Labs  Lab 04/28/23 1421  NA 135  K 4.2  CL 99  CO2 24  GLUCOSE 137*  BUN 25*  CREATININE 1.19*  CALCIUM 9.4  MG 1.8   Liver Function Tests: Recent Labs  Lab 04/28/23 1421  AST 27  ALT 20  ALKPHOS 92  BILITOT 1.2  PROT 6.5  ALBUMIN 3.5    No results for input(s): "LIPASE", "AMYLASE" in the last 168 hours. No results for input(s): "AMMONIA" in the last 168 hours. CBC: Recent Labs  Lab 04/28/23 1345  WBC 6.5  HGB 12.6  HCT 39.5  MCV 97.5  PLT 230   Cardiac Enzymes: Recent Labs  Lab 04/28/23 1345 04/28/23 1615  TROPONINIHS 11 13    BNP (last 3 results) Recent Labs    11/15/22 1153  PROBNP 877.0*   CBG: No results for input(s): "GLUCAP" in the last 168 hours.  Radiological Exams on Admission:  DG Chest Port 1 View Result Date: 04/28/2023 CLINICAL DATA:  5107 Atrial fibrillation (HCC) 5107 EXAM: PORTABLE CHEST 1 VIEW COMPARISON:  06/26/2022 FINDINGS: Stable cardiomegaly. Aortic atherosclerosis. Coarsened interstitial markings with streaky interstitial opacities in the left mid to lower lung zones and at the right lung base. Possible small left pleural effusion. No pneumothorax. IMPRESSION: Coarsened interstitial markings with streaky interstitial opacities in the left mid to lower lung zones and at the right lung base. Findings may reflect atelectasis versus edema. Electronically Signed   By: Duanne Guess D.O.   On: 04/28/2023 17:34    EKG: Independently reviewed. Afib RVR IVCD   Assessment and Plan: * A-fib (HCC) Presented in RVR. Now stabilised with diltiazem infusion - c.w. same for now. Reason is likely pneumonia. Will resume home anticoaulation and rate control meds. Anticipate that iv diltiazem will get titrtated off.  CAP (community acquired pneumonia) Patient has crackles on lung exam and CXR noted as above. Will treat with ceftriaxoen and azithromycin. Not hypoxic at this time.  High serum creatine Trend. Check UA sodium and creatinine .  CHF (congestive heart failure) (HCC) This is chronic . Last know EF is from march last year 30% or less. Currently appears slightly high vs euvolemic to me. C.w. chronic diuretic. Trend weight.   Home med rec pending pharmcy input.   Advance Care  Planning:   Code Status: Do not attempt resuscitation (DNR) PRE-ARREST INTERVENTIONS DESIRED per patient wishes experessed in front of daughter and son in law.  Consults: none at this time.  Family Communication: at bedside. Detialed discussion held.  Severity of Illness: The appropriate patient status for this patient is INPATIENT. Inpatient status is judged to be reasonable and necessary in order to provide the required intensity of service to ensure the patient's safety. The patient's presenting symptoms, physical exam findings, and initial radiographic and laboratory data in the context of their chronic comorbidities is felt to place them at high risk for further clinical deterioration. Furthermore, it is not anticipated that the patient will be medically stable for discharge from the hospital within 2 midnights of admission.   * I certify that at the point of admission it is my clinical judgment that the patient will require inpatient hospital care spanning beyond 2 midnights from the point of admission due to high intensity of service, high risk for further deterioration and high frequency of surveillance required.*  Author: Nolberto Hanlon, MD 04/28/2023 8:24 PM  For on call review [email or web address].

## 2023-04-28 NOTE — Telephone Encounter (Signed)
Chief Complaint: shortness of breath Symptoms: shortness of breath, lethargy, nausea/vomiting, abdominal cramping, fast heart rate Frequency: symptoms ongoing "for a couple weeks" w/ recent worsening of SOB with exertion Pertinent Negatives: Patient denies fever, CP Disposition: [x] ED /[] Urgent Care (no appt availability in office) / [] Appointment(In office/virtual)/ []  [facility]/ [] Home Care/ [] Refused Recommended Disposition /[]  Mobile Bus/ []  Follow-up with PCP Additional Notes: Pt reports moderate SOB with exertion, heart rate of 125, lethargy, N/V, and abdominal cramping. Pt states symptoms have been ongoing "for weeks." Pt states she took a digoxin today in an attempt to improve her heart rate. Pt states she believes her lasix is "not getting enough fluid off." Per protocol, pt advised to go to the ED. Pt states her daughter will take her soon. Pt advised to call EMS if her symptoms worsen, pt verbalized understanding.   Reason for Disposition  [1] MODERATE difficulty breathing (e.g., speaks in phrases, SOB even at rest, pulse 100-120) AND [2] NEW-onset or WORSE than normal  Answer Assessment - Initial Assessment Questions 1. RESPIRATORY STATUS: "Describe your breathing?" (e.g., wheezing, shortness of breath, unable to speak, severe coughing)      Difficulty breathing "when I'm up and breathing" (ongoing for a couple weeks), wheezing 2. ONSET: "When did this breathing problem begin?"      "I can breathe as long as I'm sitting in my chair" 3. PATTERN "Does the difficult breathing come and go, or has it been constant since it started?"      Worse with exertion 4. SEVERITY: "How bad is your breathing?" (e.g., mild, moderate, severe)    - MILD: No SOB at rest, mild SOB with walking, speaks normally in sentences, can lie down, no retractions, pulse < 100.    - MODERATE: SOB at rest, SOB with minimal exertion and prefers to sit, cannot lie down flat, speaks in phrases,  mild retractions, audible wheezing, pulse 100-120.    - SEVERE: Very SOB at rest, speaks in single words, struggling to breathe, sitting hunched forward, retractions, pulse > 120      Moderate 5. RECURRENT SYMPTOM: "Have you had difficulty breathing before?" If Yes, ask: "When was the last time?" and "What happened that time?"      Yes 6. CARDIAC HISTORY: "Do you have any history of heart disease?" (e.g., heart attack, angina, bypass surgery, angioplasty)      Yes, CHF 7. LUNG HISTORY: "Do you have any history of lung disease?"  (e.g., pulmonary embolus, asthma, emphysema)     No 8. CAUSE: "What do you think is causing the breathing problem?"      "I don't know, lasix might not be taking enough fluid off because I take an extremely low dosage" 9. OTHER SYMPTOMS: "Do you have any other symptoms? (e.g., dizziness, runny nose, cough, chest pain, fever)     Lethargic, HR 125 (HR normally in the 90s), nausea, diarrhea (3x today), abdominal cramping 10. O2 SATURATION MONITOR:  "Do you use an oxygen saturation monitor (pulse oximeter) at home?" If Yes, ask: "What is your reading (oxygen level) today?" "What is your usual oxygen saturation reading?" (e.g., 95%)       96% at this time  Protocols used: Breathing Difficulty-A-AH

## 2023-04-28 NOTE — ED Triage Notes (Signed)
Pt via POV c/o 2 weeks of SOB/fatigue/diarrhea and a fib with HR 124 earlier today. Pt is a/o x 4. She saw her cardiovascular specialist yesterday and then her PCP advised her to come to the ER. No pain.

## 2023-04-28 NOTE — ED Notes (Signed)
Pt incontinent of urine; pt cleaned, brief changed 

## 2023-04-28 NOTE — Assessment & Plan Note (Signed)
Patient has crackles on lung exam and CXR noted as above. Will treat with ceftriaxoen and azithromycin. Not hypoxic at this time.

## 2023-04-28 NOTE — Assessment & Plan Note (Signed)
Trend. Check UA sodium and creatinine .

## 2023-04-29 ENCOUNTER — Encounter (HOSPITAL_COMMUNITY): Payer: Self-pay | Admitting: Internal Medicine

## 2023-04-29 DIAGNOSIS — I4891 Unspecified atrial fibrillation: Secondary | ICD-10-CM | POA: Diagnosis not present

## 2023-04-29 DIAGNOSIS — J9600 Acute respiratory failure, unspecified whether with hypoxia or hypercapnia: Secondary | ICD-10-CM | POA: Diagnosis present

## 2023-04-29 LAB — APTT: aPTT: 34 s (ref 24–36)

## 2023-04-29 LAB — URINALYSIS, COMPLETE (UACMP) WITH MICROSCOPIC
Bilirubin Urine: NEGATIVE
Glucose, UA: NEGATIVE mg/dL
Ketones, ur: NEGATIVE mg/dL
Nitrite: POSITIVE — AB
Protein, ur: NEGATIVE mg/dL
Specific Gravity, Urine: 1.014 (ref 1.005–1.030)
WBC, UA: >50 WBC/hpf (ref 0–5)
pH: 5 (ref 5.0–8.0)

## 2023-04-29 LAB — CREATININE, URINE, RANDOM: Creatinine, Urine: 94 mg/dL

## 2023-04-29 LAB — PROTIME-INR
INR: 1.7 — ABNORMAL HIGH (ref 0.8–1.2)
Prothrombin Time: 19.9 s — ABNORMAL HIGH (ref 11.4–15.2)

## 2023-04-29 LAB — BASIC METABOLIC PANEL
Anion gap: 8 (ref 5–15)
BUN: 22 mg/dL (ref 8–23)
CO2: 26 mmol/L (ref 22–32)
Calcium: 8.9 mg/dL (ref 8.9–10.3)
Chloride: 103 mmol/L (ref 98–111)
Creatinine, Ser: 1.07 mg/dL — ABNORMAL HIGH (ref 0.44–1.00)
GFR, Estimated: 49 mL/min — ABNORMAL LOW (ref >60)
Glucose, Bld: 91 mg/dL (ref 70–99)
Potassium: 4 mmol/L (ref 3.5–5.1)
Sodium: 137 mmol/L (ref 135–145)

## 2023-04-29 LAB — SODIUM, URINE, RANDOM: Sodium, Ur: 39 mmol/L

## 2023-04-29 LAB — CBC
HCT: 35.4 % — ABNORMAL LOW (ref 36.0–46.0)
Hemoglobin: 11.3 g/dL — ABNORMAL LOW (ref 12.0–15.0)
MCH: 31.6 pg (ref 26.0–34.0)
MCHC: 31.9 g/dL (ref 30.0–36.0)
MCV: 98.9 fL (ref 80.0–100.0)
Platelets: 180 10*3/uL (ref 150–400)
RBC: 3.58 MIL/uL — ABNORMAL LOW (ref 3.87–5.11)
RDW: 13.8 % (ref 11.5–15.5)
WBC: 6.2 10*3/uL (ref 4.0–10.5)
nRBC: 0 % (ref 0.0–0.2)

## 2023-04-29 MED ORDER — VENLAFAXINE HCL ER 75 MG PO CP24
75.0000 mg | ORAL_CAPSULE | Freq: Every morning | ORAL | Status: DC
  Administered 2023-04-29 – 2023-05-02 (×4): 75 mg via ORAL
  Filled 2023-04-29 (×4): qty 1

## 2023-04-29 MED ORDER — DILTIAZEM HCL ER COATED BEADS 120 MG PO CP24
120.0000 mg | ORAL_CAPSULE | Freq: Every day | ORAL | Status: AC
  Administered 2023-04-29: 120 mg via ORAL
  Filled 2023-04-29 (×2): qty 1

## 2023-04-29 MED ORDER — DIGOXIN 125 MCG PO TABS
0.1250 mg | ORAL_TABLET | ORAL | Status: DC
  Administered 2023-05-01: 0.125 mg via ORAL
  Filled 2023-04-29 (×2): qty 1

## 2023-04-29 MED ORDER — TRAMADOL HCL 50 MG PO TABS
50.0000 mg | ORAL_TABLET | Freq: Three times a day (TID) | ORAL | Status: DC | PRN
  Filled 2023-04-29: qty 1

## 2023-04-29 MED ORDER — DILTIAZEM HCL ER COATED BEADS 120 MG PO CP24
120.0000 mg | ORAL_CAPSULE | Freq: Every day | ORAL | Status: DC
  Administered 2023-04-29: 120 mg via ORAL
  Filled 2023-04-29 (×2): qty 1

## 2023-04-29 MED ORDER — METOPROLOL SUCCINATE ER 100 MG PO TB24
200.0000 mg | ORAL_TABLET | Freq: Every day | ORAL | Status: DC
Start: 2023-04-30 — End: 2023-05-02
  Administered 2023-04-30 – 2023-05-02 (×3): 200 mg via ORAL
  Filled 2023-04-29 (×3): qty 2

## 2023-04-29 NOTE — Progress Notes (Signed)
On arrival to floor, patient and family voiced multiple concerns regarding home medications they would like for [place] to address: * patient takes torsemide daily at home; has not had any since presentation to our ED.  Based on her history, she was 148lbs prior to admission; currently 150.8lbs on our scale with some 2+ pitting lower extremity edema at this time.  Also demonstrates increased work of breathing with mild activity (up to bedside commode on arrival). *  family stated patient was on diltiazem IV drip and then received an oral dose of diltiazem (120mg  ER) this AM.  This is a new medication; provided education at bedside.  Noted there is a repeat 120mg  dose scheduled at 8:00 PM.  This was questioned by family; however, rate is currently labile from 90 - 130, atrial fibrillation.  Will clarify with MD. *  PTA medication list reviewed with patient/family.  They state she is no longer taking digoxin at home (Dr. Royann Shivers discontinued this about 6 months ago).  They requested we clarify this also.  Secure message sent to Dr. Carmelia Roller.  Continuing to monitor.

## 2023-04-29 NOTE — Progress Notes (Signed)
PROGRESS NOTE    [name]  UJW:[id number] DOB: 13-Jul-1930 DOA: 04/28/2023 PCP: [name], MD     Brief Narrative:  Patient is a 89 year old female with a history of atrial fibrillation, CHF, DJD, depression/anxiety, GERD presents for shortness of breath, particularly on exertion for the past couple weeks.  It was worsening.  She had associated fatigue.  Found to be in A-fib with RVR.  Diltiazem drip was started.  Concern for pneumonia in the ED, started on Rocephin and azithromycin.  She is not on oxygen at home.   New events last 24 hours / Subjective: Patient's heart rate dropped to the 60s.  She was transitioned to oral Cardizem.  She is feels much better and wishes to go home.  When she was ambulating the hallway, her oxygen dropped to 76%.  Assessment & Plan:   Principal Problem:   Atrial fibrillation with RVR- CHADs VASc=4  Converted back, off of drip and on diltiazem 120 mg, transition back to metoprolol tomorrow  Dig 0.125 mg Monday, Wednesday, Fri  Eliquis 2.5 mg twice daily  Active Problems:   Anxiety and depression  Effexor 150 mg daily    DJD (degenerative joint disease)  Tramadol 50 mg 3 times daily as needed    GERD (gastroesophageal reflux disease)  Protonix 20 mg daily     CHF (congestive heart failure) (HCC)  Hold Demadex/K+    CAP (community acquired pneumonia)  Continue azithromycin and Rocephin    Acute respiratory failure (HCC)  2 L nasal cannula  Wean as able, when she has weanable and can ambulate without desaturation, will discharge     DVT prophylaxis: SCDs Code Status: DNR Family Communication: Self, none bedside Coming From: Home Disposition Plan: Hopefully home Barriers to Discharge: Clinical improvement  Consultants:  None  Procedures:  None  Antimicrobials:  Anti-infectives (From admission, onward)    Start     Dose/Rate Route Frequency Ordered Stop   04/28/23 2030  cefTRIAXone (ROCEPHIN) 1 g in sodium chloride  0.9 % 100 mL IVPB        1 g 200 mL/hr over 30 Minutes Intravenous Every 24 hours 04/28/23 2016     04/28/23 2030  azithromycin (ZITHROMAX) 500 mg in sodium chloride 0.9 % 250 mL IVPB        500 mg 250 mL/hr over 60 Minutes Intravenous Every 24 hours 04/28/23 2016 05/01/23 2029        Objective: Vitals:   04/29/23 0715 04/29/23 0727 04/29/23 0800 04/29/23 0936  BP:  (!) 94/55 (!) 108/58 108/73  Pulse: 63 62  82  Resp: 20 18  16   Temp:    97.8 F (36.6 C)  TempSrc:    Oral  SpO2: 91% 96%  97%  Weight:      Height:        Intake/Output Summary (Last 24 hours) at 04/29/2023 1144 Last data filed at 04/29/2023 0934 Gross per 24 hour  Intake 75.29 ml  Output --  Net 75.29 ml   Filed Weights   04/28/23 1328  Weight: 67.1 kg    Examination:  General exam: Appears calm and comfortable  Respiratory system: Clear to auscultation. Respiratory effort normal. No respiratory distress. No conversational dyspnea.  Cardiovascular system: S1 & S2 heard, regular rate, irregularly irregular rhythm. No murmurs. No pedal edema. Gastrointestinal system: Abdomen is nondistended, soft and nontender. Normal bowel sounds heard. Central nervous system: Alert and oriented. No focal neurological deficits. Speech clear.  Extremities: Symmetric in  appearance  Skin: No rashes, lesions or ulcers on exposed skin  Psychiatry: Judgement and insight appear normal. Mood & affect appropriate.   Data Reviewed: I have personally reviewed following labs and imaging studies  CBC: Recent Labs  Lab 04/28/23 1345 04/29/23 0520  WBC 6.5 6.2  HGB 12.6 11.3*  HCT 39.5 35.4*  MCV 97.5 98.9  PLT 230 180   Basic Metabolic Panel: Recent Labs  Lab 04/28/23 1421 04/29/23 0520  NA 135 137  K 4.2 4.0  CL 99 103  CO2 24 26  GLUCOSE 137* 91  BUN 25* 22  CREATININE 1.19* 1.07*  CALCIUM 9.4 8.9  MG 1.8  --    GFR: Estimated Creatinine Clearance: 31.4 mL/min (A) (by C-G formula based on SCr of 1.07 mg/dL  (H)).  Liver Function Tests: Recent Labs  Lab 04/28/23 1421  AST 27  ALT 20  ALKPHOS 92  BILITOT 1.2  PROT 6.5  ALBUMIN 3.5   Coagulation Profile: Recent Labs  Lab 04/28/23 1345 04/29/23 0520  INR 1.6* 1.7*   Recent Results (from the past 240 hours)  Resp panel by RT-PCR (RSV, Flu A&B, Covid) Anterior Nasal Swab     Status: None   Collection Time: 04/28/23  2:12 PM   Specimen: Anterior Nasal Swab  Result Value Ref Range Status   SARS Coronavirus 2 by RT PCR NEGATIVE NEGATIVE Final   Influenza A by PCR NEGATIVE NEGATIVE Final   Influenza B by PCR NEGATIVE NEGATIVE Final    Comment: (NOTE) The Xpert Xpress SARS-CoV-2/FLU/RSV plus assay is intended as an aid in the diagnosis of influenza from Nasopharyngeal swab specimens and should not be used as a sole basis for treatment. Nasal washings and aspirates are unacceptable for Xpert Xpress SARS-CoV-2/FLU/RSV testing.  Fact Sheet for Patients: BloggerCourse.com  Fact Sheet for Healthcare Providers: SeriousBroker.it  This test is not yet approved or cleared by the [place] FDA and has been authorized for detection and/or diagnosis of SARS-CoV-2 by FDA under an Emergency Use Authorization (EUA). This EUA will remain in effect (meaning this test can be used) for the duration of the COVID-19 declaration under Section 564(b)(1) of the Act, 21 U.S.C. section 360bbb-3(b)(1), unless the authorization is terminated or revoked.     Resp Syncytial Virus by PCR NEGATIVE NEGATIVE Final    Comment: (NOTE) Fact Sheet for Patients: BloggerCourse.com  Fact Sheet for Healthcare Providers: SeriousBroker.it  This test is not yet approved or cleared by the [place] FDA and has been authorized for detection and/or diagnosis of SARS-CoV-2 by FDA under an Emergency Use Authorization (EUA). This EUA will remain in effect (meaning  this test can be used) for the duration of the COVID-19 declaration under Section 564(b)(1) of the Act, 21 U.S.C. section 360bbb-3(b)(1), unless the authorization is terminated or revoked.  Performed at [facility], [address] [place], [place] [address]   Respiratory (~20 pathogens) panel by PCR     Status: None   Collection Time: 04/28/23  2:12 PM   Specimen: Nasopharyngeal Swab; Respiratory  Result Value Ref Range Status   Adenovirus NOT DETECTED NOT DETECTED Final   Coronavirus 229E NOT DETECTED NOT DETECTED Final    Comment: (NOTE) The Coronavirus on the Respiratory Panel, DOES NOT test for the novel  Coronavirus (2019 nCoV)    Coronavirus HKU1 NOT DETECTED NOT DETECTED Final   Coronavirus NL63 NOT DETECTED NOT DETECTED Final   Coronavirus OC43 NOT DETECTED NOT DETECTED Final   Metapneumovirus NOT  DETECTED NOT DETECTED Final   Rhinovirus / Enterovirus NOT DETECTED NOT DETECTED Final   Influenza A NOT DETECTED NOT DETECTED Final   Influenza B NOT DETECTED NOT DETECTED Final   Parainfluenza Virus 1 NOT DETECTED NOT DETECTED Final   Parainfluenza Virus 2 NOT DETECTED NOT DETECTED Final   Parainfluenza Virus 3 NOT DETECTED NOT DETECTED Final   Parainfluenza Virus 4 NOT DETECTED NOT DETECTED Final   Respiratory Syncytial Virus NOT DETECTED NOT DETECTED Final   Bordetella pertussis NOT DETECTED NOT DETECTED Final   Bordetella Parapertussis NOT DETECTED NOT DETECTED Final   Chlamydophila pneumoniae NOT DETECTED NOT DETECTED Final   Mycoplasma pneumoniae NOT DETECTED NOT DETECTED Final    Comment: Performed at [facility], [address] [place], [place] [address]  Culture, blood (Routine X 2) w Reflex to ID Panel     Status: None (Preliminary result)   Collection Time: 04/28/23  8:48 PM   Specimen: BLOOD RIGHT HAND  Result Value Ref Range Status   Specimen Description BLOOD RIGHT HAND  Final   Special Requests   Final    BOTTLES DRAWN AEROBIC AND ANAEROBIC  Blood Culture adequate volume   Culture   Final    NO GROWTH < 12 HOURS Performed at [facility], [address], [place], [place] [address]    Report Status PENDING  Incomplete  Culture, blood (Routine X 2) w Reflex to ID Panel     Status: None (Preliminary result)   Collection Time: 04/28/23  8:54 PM   Specimen: BLOOD  Result Value Ref Range Status   Specimen Description BLOOD BLOOD LEFT HAND  Final   Special Requests   Final    BOTTLES DRAWN AEROBIC AND ANAEROBIC Blood Culture adequate volume   Culture   Final    NO GROWTH < 12 HOURS Performed at [facility], [address] [place], [place] [address]    Report Status PENDING  Incomplete      Radiology Studies: DG Chest Port 1 View Result Date: 04/28/2023 CLINICAL DATA:  5107 Atrial fibrillation (HCC) 5107 EXAM: PORTABLE CHEST 1 VIEW COMPARISON:  06/26/2022 FINDINGS: Stable cardiomegaly. Aortic atherosclerosis. Coarsened interstitial markings with streaky interstitial opacities in the left mid to lower lung zones and at the right lung base. Possible small left pleural effusion. No pneumothorax. IMPRESSION: Coarsened interstitial markings with streaky interstitial opacities in the left mid to lower lung zones and at the right lung base. Findings may reflect atelectasis versus edema. Electronically Signed   By: Duanne Guess D.O.   On: 04/28/2023 17:34     Scheduled Meds:  apixaban  2.5 mg Oral BID   diltiazem  120 mg Oral Daily   pantoprazole  20 mg Oral Daily   sodium chloride flush  3 mL Intravenous Q12H   Continuous Infusions:  azithromycin Stopped (04/28/23 2326)   cefTRIAXone (ROCEPHIN)  IV Stopped (04/28/23 2157)   diltiazem (CARDIZEM) infusion Stopped (04/29/23 0934)     LOS: 1 day    Time spent: 35 minutes   Sharlene Dory, DO [facility] 04/29/2023, 11:44 AM   Available via Epic secure chat 7am-7pm After these hours, please refer to coverage provider listed on [email or web address]

## 2023-04-29 NOTE — Progress Notes (Signed)
Brief progress note: Notified by nursing staff the patient's heart rate is in the 60s.  Blood pressure reviewed.  Keep infusion going of Cardizem 5 mg/h for the next hour after giving oral medication and then stop.  Hold torsemide for now as well.

## 2023-04-29 NOTE — ED Notes (Signed)
Per Dr. Arva Chafe, continue diltiazem infusion for one hour after administration of oral diltiazem and then discontinue infusion. Oral diltiazem dose requested from main pharmacy.

## 2023-04-29 NOTE — ED Notes (Addendum)
Pt o2 went down to 76% as we were ambulating. Pt O2 started at 97. PT was not feeling dizzy or feeling short of breathe.

## 2023-04-30 ENCOUNTER — Inpatient Hospital Stay (HOSPITAL_COMMUNITY): Payer: Medicare Other

## 2023-04-30 DIAGNOSIS — I4891 Unspecified atrial fibrillation: Secondary | ICD-10-CM | POA: Diagnosis not present

## 2023-04-30 LAB — BASIC METABOLIC PANEL
Anion gap: 10 (ref 5–15)
BUN: 15 mg/dL (ref 8–23)
CO2: 28 mmol/L (ref 22–32)
Calcium: 9.4 mg/dL (ref 8.9–10.3)
Chloride: 101 mmol/L (ref 98–111)
Creatinine, Ser: 0.96 mg/dL (ref 0.44–1.00)
GFR, Estimated: 56 mL/min — ABNORMAL LOW (ref >60)
Glucose, Bld: 108 mg/dL — ABNORMAL HIGH (ref 70–99)
Potassium: 3.7 mmol/L (ref 3.5–5.1)
Sodium: 139 mmol/L (ref 135–145)

## 2023-04-30 LAB — CBC
HCT: 36.8 % (ref 36.0–46.0)
Hemoglobin: 11.7 g/dL — ABNORMAL LOW (ref 12.0–15.0)
MCH: 31.3 pg (ref 26.0–34.0)
MCHC: 31.8 g/dL (ref 30.0–36.0)
MCV: 98.4 fL (ref 80.0–100.0)
Platelets: 184 10*3/uL (ref 150–400)
RBC: 3.74 MIL/uL — ABNORMAL LOW (ref 3.87–5.11)
RDW: 14 % (ref 11.5–15.5)
WBC: 7.4 10*3/uL (ref 4.0–10.5)
nRBC: 0 % (ref 0.0–0.2)

## 2023-04-30 MED ORDER — TORSEMIDE 20 MG PO TABS
10.0000 mg | ORAL_TABLET | Freq: Every day | ORAL | Status: DC
  Administered 2023-04-30: 10 mg via ORAL
  Filled 2023-04-30: qty 1

## 2023-04-30 MED ORDER — POTASSIUM CHLORIDE CRYS ER 20 MEQ PO TBCR
40.0000 meq | EXTENDED_RELEASE_TABLET | Freq: Every day | ORAL | Status: DC
Start: 2023-04-30 — End: 2023-05-02
  Administered 2023-04-30 – 2023-05-02 (×3): 40 meq via ORAL
  Filled 2023-04-30 (×3): qty 2

## 2023-04-30 MED ORDER — DOCUSATE SODIUM 50 MG PO CAPS
50.0000 mg | ORAL_CAPSULE | Freq: Once | ORAL | Status: AC
  Administered 2023-05-01: 50 mg via ORAL
  Filled 2023-04-30: qty 1

## 2023-04-30 NOTE — Plan of Care (Signed)

## 2023-04-30 NOTE — Plan of Care (Signed)
Will continue to monitor patient.

## 2023-04-30 NOTE — Evaluation (Signed)
Occupational Therapy Evaluation Patient Details Name: [name] MRN: [id number] DOB: 1930-08-27 Today's Date: 04/30/2023   History of Present Illness Pt is a 89 y.o. female who presented to [facility] ED on 04/28/23 with c/o fatigue and SOB, particularly on exertion, for the past couple weeks that was worsening. Pt found to be in A-fib with RVR and with concern for pneumonia and with O2 sat dropping to 76% on RA with excertion in ED. PMH: AFib, CHF, DJD, depression/anxiety, IBS, osteoporosis, sciatica, L hemi hip arthroplasty (2021), R tibia fx with ORIF (06/2022)   Clinical Impression   At baseline, pt completes ADLs Independent to Mod I and performs functional transfers/mobility with a RW with Mod I. At baseline, pt receives assistance from family for IADLs. Pt now presents with decreased activity tolerance, decreased balance, impaired cardiopulmonary status, generalized B UE weakness, and decreased safety and independence with functional tasks. Pt currently demonstrates ability to complete UB ADLs Independent to Contact guard assist, LB ADLs with Contact guard assist, and functional mobility/transfers with a RW with Contact guard assist. Pt with brief O2 desat to 89% on RA during functional mobility of approx. 30 ft. with O2 sat quickly recovering to 93% with standing rest break and otherwise >/93% on RA throughout session. Pt HR largely in the 80s to low 100s with brief elevatiion to 134 bpm during functional mobility with quick recovery to 110 bpm with standing rest break. Pt participated well in session and is motivated to return to PLOF. Pt will benefit from acute skilled OT services to address deficits outlined below and increase safety and independence with functional tasks. Post acute discharge, pt will benefit from continued skilled OT services in the home to maximize rehab potential.       If plan is discharge home, recommend the following: A little help with walking and/or transfers;A little  help with bathing/dressing/bathroom;Assistance with cooking/housework;Assist for transportation;Help with stairs or ramp for entrance    Functional Status Assessment  Patient has had a recent decline in their functional status and demonstrates the ability to make significant improvements in function in a reasonable and predictable amount of time.  Equipment Recommendations  None recommended by OT (Pt already has needed equipment)    Recommendations for Other Services       Precautions / Restrictions Precautions Precautions: Fall;Other (comment) Precaution Comments: watch O2 and HR Restrictions Weight Bearing Restrictions Per Provider Order: No      Mobility Bed Mobility Overal bed mobility: Needs Assistance Bed Mobility: Supine to Sit, Sit to Supine     Supine to sit: Supervision, HOB elevated, Used rails Sit to supine: Contact guard assist        Transfers Overall transfer level: Needs assistance Equipment used: Rolling walker (2 wheels) Transfers: Sit to/from Stand, Bed to chair/wheelchair/BSC Sit to Stand: Contact guard assist     Step pivot transfers: Contact guard assist     General transfer comment: occasional cues for hand placement/technique and safety      Balance Overall balance assessment: Needs assistance Sitting-balance support: No upper extremity supported, Feet supported Sitting balance-Leahy Scale: Good     Standing balance support: Single extremity supported, Bilateral upper extremity supported, No upper extremity supported, During functional activity Standing balance-Leahy Scale: Fair Standing balance comment: Able to maintain static stand, requires UE support to maintain balance in dynamic standing  ADL either performed or assessed with clinical judgement   ADL Overall ADL's : Needs assistance/impaired Eating/Feeding: Independent;Sitting   Grooming: Contact guard assist;Standing   Upper Body Bathing:  Set up;Sitting (with increased time and rest breaks)   Lower Body Bathing: Contact guard assist;Sit to/from stand (with increased time and rest breaks)   Upper Body Dressing : Set up;Sitting   Lower Body Dressing: Contact guard assist;Sit to/from stand   Toilet Transfer: Contact guard assist;Ambulation;Rolling walker (2 wheels);BSC/3in1;Cueing for safety   Toileting- Clothing Manipulation and Hygiene: Contact guard assist;Sit to/from stand       Functional mobility during ADLs: Contact guard assist;Rolling walker (2 wheels);Cueing for safety General ADL Comments: Pt with decreased activity tolerance and fatiguing quickly during tasks.     Vision Baseline Vision/History: 1 Wears glasses Ability to See in Adequate Light: 0 Adequate (with glasses) Patient Visual Report: No change from baseline       Perception         Praxis         Pertinent Vitals/Pain Pain Assessment Pain Assessment: No/denies pain     Extremity/Trunk Assessment Upper Extremity Assessment Upper Extremity Assessment: Right hand dominant;Generalized weakness;RUE deficits/detail;LUE deficits/detail RUE Deficits / Details: generalized weakness; pt reports occasional numbness tingling in hand, worse on radial side and worse on Right, and occasional "catching" of finger joints with neither present this session. LUE Deficits / Details: generalized weakness; pt reports occasional numbness tingling in hand, worse on radial side and worse on Right, and occasional "catching" of finger joints with neither present this session.   Lower Extremity Assessment Lower Extremity Assessment: Defer to PT evaluation   Cervical / Trunk Assessment Cervical / Trunk Assessment: Normal   Communication Communication Communication: No apparent difficulties Cueing Techniques: Verbal cues (occasional cues for hand placement/techniques with transfers)   Cognition Arousal: Alert Behavior During Therapy: WFL for tasks  assessed/performed Overall Cognitive Status: Within Functional Limits for tasks assessed                                 General Comments: AAOx4 and pleasant throughout. Demonstrates ability to follow multi-step instructions consistently. Demonstrates good safety awareness and good insight into deficits.     General Comments  Pt with brief O2 desat to 89% on RA during functional mobility of approx. 30 ft. with O2 sat quickly recovering to 93% with standing rest break and otherwise >/93% on RA throughout session. Pt HR largely in the 80s to low 100s with brief elevatiion to 134 bpm during functional mobility with quick recovery to 110 bpm with standing rest break.    Exercises     Shoulder Instructions      Home Living Family/patient expects to be discharged to:: Private residence Living Arrangements: Children (apartment connected to daughter and SIL's home) Available Help at Discharge: Family;Available 24 hours/day Type of Home: House Home Access: Stairs to enter;Ramped entrance Entrance Stairs-Number of Steps: typically uses ramp at front door   Home Layout: One level     Bathroom Shower/Tub: Producer, television/film/video: Handicapped height Bathroom Accessibility: Yes How Accessible: Accessible via wheelchair;Accessible via walker Home Equipment: Rolling Walker (2 wheels);Rollator (4 wheels);Wheelchair - manual;Transport chair;BSC/3in1;Grab bars - toilet;Grab bars - tub/shower;Hand held shower head;Shower seat (RW has a seat)          Prior Functioning/Environment Prior Level of Function : Independent/Modified Independent;Needs assist  Mobility Comments: At baseline, pt performs funcitonal mobility/transfers with a RW with Mod I. Pt reports a hx of falls but none in the past 6 months. Last reported fall was in 06/2022 leading to R tibia fx and R LE ORIF. ADLs Comments: At baseline, pt is Independent to Mod I with ADLs. Family assists with  IADLs        OT Problem List: Decreased strength;Decreased activity tolerance;Impaired balance (sitting and/or standing);Cardiopulmonary status limiting activity      OT Treatment/Interventions: Self-care/ADL training;Therapeutic exercise;Energy conservation;DME and/or AE instruction;Therapeutic activities;Patient/family education;Balance training    OT Goals(Current goals can be found in the care plan section) Acute Rehab OT Goals Patient Stated Goal: to not feel SOB and to return home OT Goal Formulation: With patient Time For Goal Achievement: 05/14/23 Potential to Achieve Goals: Good ADL Goals Pt Will Perform Grooming: with modified independence;standing Pt Will Perform Lower Body Bathing: with modified independence;sit to/from stand Pt Will Perform Lower Body Dressing: with modified independence;sit to/from stand Pt Will Transfer to Toilet: with modified independence;ambulating;regular height toilet;grab bars (with least restrictive AD) Pt Will Perform Toileting - Clothing Manipulation and hygiene: with modified independence;sit to/from stand;sitting/lateral leans Pt/caregiver will Perform Home Exercise Program: Increased strength;Both right and left upper extremity;With theraband;With theraputty;Independently;With written HEP provided (Increased activity tolerance) Additional ADL Goal #1: Patient will demonstrate ability to Independently state 4 energy conservation strategies for increased safety and independence with functional tasks.  OT Frequency: Min 1X/week    Co-evaluation              AM-PAC OT "6 Clicks" Daily Activity     Outcome Measure Help from another person eating meals?: None Help from another person taking care of personal grooming?: A Little Help from another person toileting, which includes using toliet, bedpan, or urinal?: A Little Help from another person bathing (including washing, rinsing, drying)?: A Little Help from another person to put on and  taking off regular upper body clothing?: A Little Help from another person to put on and taking off regular lower body clothing?: A Little 6 Click Score: 19   End of Session Equipment Utilized During Treatment: Gait belt;Rolling walker (2 wheels) Nurse Communication: Mobility status;Other (comment) (Vital signs)  Activity Tolerance: Patient tolerated treatment well Patient left: in bed;with call bell/phone within reach;with bed alarm set  OT Visit Diagnosis: Unsteadiness on feet (R26.81);Other abnormalities of gait and mobility (R26.89);History of falling (Z91.81);Muscle weakness (generalized) (M62.81);Other (comment) (decreased activity tolerance)                Time: 2841-3244 OT Time Calculation (min): 36 min Charges:  OT General Charges $OT Visit: 1 Visit OT Evaluation $OT Eval Low Complexity: 1 Low OT Treatments $Self Care/Home Management : 8-22 mins  Cashay Manganelli "Orson Eva., OTR/L, MA Acute Rehab [phone]   Lendon Colonel 04/30/2023, 5:53 PM

## 2023-04-30 NOTE — Progress Notes (Signed)
PROGRESS NOTE    [name]  UEA:[id number] DOB: 07-22-1930 DOA: 04/28/2023 PCP: [name], MD     Brief Narrative:  Patient is a 89 year old female with a history of atrial fibrillation, CHF, DJD, depression/anxiety, GERD presents for shortness of breath, particularly on exertion for the past couple weeks.  It was worsening.  She had associated fatigue.  Found to be in A-fib with RVR.  Diltiazem drip was started.  Concern for pneumonia in the ED, started on Rocephin and azithromycin.  She is not on oxygen at home.  She was going to go home on 1/18 but desaturated to 76% w ambulation.    New events last 24 hours / Subjective: Patient reports feeling good.  She is ready to go home.  She would like to come off the oxygen.  No shortness of breath, wheezing, coughing, fevers.  She does not feel swollen.  After speaking with her daughter, I did confirm she is on torsemide 10 mg daily.  This was held yesterday due to lower blood pressures.  Assessment & Plan:  Principal Problem:   Atrial fibrillation with RVR- CHADs VASc=4             Converted, back on Toprol XL 200 mg/d             Dig 0.125 mg Monday, Wednesday, Fri             Eliquis 2.5 mg twice daily   Active Problems:   Anxiety and depression             Effexor 150 mg daily     DJD (degenerative joint disease)             Tramadol 50 mg 3 times daily as needed     GERD (gastroesophageal reflux disease)             Protonix 20 mg daily                CHF (congestive heart failure) (HCC)             Start torsemide 10 mg daily with potassium  Does not appear volume overloaded     CAP (community acquired pneumonia)             Continue azithromycin and Rocephin     Acute respiratory failure (HCC)             Take off of O2 and see how she does in next 24 hrs  CXR does not show significant changes  DVT prophylaxis: Eliquis Code Status: DNR Family Communication: Spoke w daughter [name] on the phone Coming From:  Home Disposition Plan: Hopefully home Barriers to Discharge: Clnical improvement  Consultants:  None   Procedures:  None   Antimicrobials:  Anti-infectives (From admission, onward)    Start     Dose/Rate Route Frequency Ordered Stop   04/28/23 2030  cefTRIAXone (ROCEPHIN) 1 g in sodium chloride 0.9 % 100 mL IVPB        1 g 200 mL/hr over 30 Minutes Intravenous Every 24 hours 04/28/23 2016     04/28/23 2030  azithromycin (ZITHROMAX) 500 mg in sodium chloride 0.9 % 250 mL IVPB        500 mg 250 mL/hr over 60 Minutes Intravenous Every 24 hours 04/28/23 2016 05/01/23 2029        Objective: Vitals:   04/30/23 0500 04/30/23 0600 04/30/23 0628 04/30/23 1146  BP:   109/62   Pulse:  91    Resp:  (!) 27 18 18   Temp:   97.8 F (36.6 C) 99.6 F (37.6 C)  TempSrc:   Oral Oral  SpO2:  98% 99%   Weight: 68.1 kg     Height:        Intake/Output Summary (Last 24 hours) at 04/30/2023 1214 Last data filed at 04/30/2023 0551 Gross per 24 hour  Intake 901.39 ml  Output 200 ml  Net 701.39 ml   Filed Weights   04/28/23 1328 04/29/23 1724 04/30/23 0500  Weight: 67.1 kg 68.4 kg 68.1 kg    Examination:  General exam: Appears calm and comfortable  Respiratory system: Clear to auscultation. Respiratory effort normal. No respiratory distress. No conversational dyspnea.  Cardiovascular system: S1 & S2 heard, regular rate, irregularly irregular rhythm trace edema around the ankles Skin: No rashes, lesions or ulcers on exposed skin  Psychiatry: Judgement and insight appear normal. Mood & affect appropriate.   Data Reviewed: I have personally reviewed following labs and imaging studies  CBC: Recent Labs  Lab 04/28/23 1345 04/29/23 0520 04/30/23 0342  WBC 6.5 6.2 7.4  HGB 12.6 11.3* 11.7*  HCT 39.5 35.4* 36.8  MCV 97.5 98.9 98.4  PLT 230 180 184   Basic Metabolic Panel: Recent Labs  Lab 04/28/23 1421 04/29/23 0520 04/30/23 0342  NA 135 137 139  K 4.2 4.0 3.7  CL 99 103  101  CO2 24 26 28   GLUCOSE 137* 91 108*  BUN 25* 22 15  CREATININE 1.19* 1.07* 0.96  CALCIUM 9.4 8.9 9.4  MG 1.8  --   --    GFR: Estimated Creatinine Clearance: 35 mL/min (by C-G formula based on SCr of 0.96 mg/dL).  Liver Function Tests: Recent Labs  Lab 04/28/23 1421  AST 27  ALT 20  ALKPHOS 92  BILITOT 1.2  PROT 6.5  ALBUMIN 3.5   Coagulation Profile: Recent Labs  Lab 04/28/23 1345 04/29/23 0520  INR 1.6* 1.7*   Recent Results (from the past 240 hours)  Resp panel by RT-PCR (RSV, Flu A&B, Covid) Anterior Nasal Swab     Status: None   Collection Time: 04/28/23  2:12 PM   Specimen: Anterior Nasal Swab  Result Value Ref Range Status   SARS Coronavirus 2 by RT PCR NEGATIVE NEGATIVE Final   Influenza A by PCR NEGATIVE NEGATIVE Final   Influenza B by PCR NEGATIVE NEGATIVE Final    Comment: (NOTE) The Xpert Xpress SARS-CoV-2/FLU/RSV plus assay is intended as an aid in the diagnosis of influenza from Nasopharyngeal swab specimens and should not be used as a sole basis for treatment. Nasal washings and aspirates are unacceptable for Xpert Xpress SARS-CoV-2/FLU/RSV testing.  Fact Sheet for Patients: BloggerCourse.com  Fact Sheet for Healthcare Providers: SeriousBroker.it  This test is not yet approved or cleared by the [place] FDA and has been authorized for detection and/or diagnosis of SARS-CoV-2 by FDA under an Emergency Use Authorization (EUA). This EUA will remain in effect (meaning this test can be used) for the duration of the COVID-19 declaration under Section 564(b)(1) of the Act, 21 U.S.C. section 360bbb-3(b)(1), unless the authorization is terminated or revoked.     Resp Syncytial Virus by PCR NEGATIVE NEGATIVE Final    Comment: (NOTE) Fact Sheet for Patients: BloggerCourse.com  Fact Sheet for Healthcare Providers: SeriousBroker.it  This test  is not yet approved or cleared by the [place] FDA and has been authorized for detection and/or diagnosis of SARS-CoV-2 by FDA under  an Emergency Use Authorization (EUA). This EUA will remain in effect (meaning this test can be used) for the duration of the COVID-19 declaration under Section 564(b)(1) of the Act, 21 U.S.C. section 360bbb-3(b)(1), unless the authorization is terminated or revoked.  Performed at [facility], [address] [place], [place] [address]   Respiratory (~20 pathogens) panel by PCR     Status: None   Collection Time: 04/28/23  2:12 PM   Specimen: Nasopharyngeal Swab; Respiratory  Result Value Ref Range Status   Adenovirus NOT DETECTED NOT DETECTED Final   Coronavirus 229E NOT DETECTED NOT DETECTED Final    Comment: (NOTE) The Coronavirus on the Respiratory Panel, DOES NOT test for the novel  Coronavirus (2019 nCoV)    Coronavirus HKU1 NOT DETECTED NOT DETECTED Final   Coronavirus NL63 NOT DETECTED NOT DETECTED Final   Coronavirus OC43 NOT DETECTED NOT DETECTED Final   Metapneumovirus NOT DETECTED NOT DETECTED Final   Rhinovirus / Enterovirus NOT DETECTED NOT DETECTED Final   Influenza A NOT DETECTED NOT DETECTED Final   Influenza B NOT DETECTED NOT DETECTED Final   Parainfluenza Virus 1 NOT DETECTED NOT DETECTED Final   Parainfluenza Virus 2 NOT DETECTED NOT DETECTED Final   Parainfluenza Virus 3 NOT DETECTED NOT DETECTED Final   Parainfluenza Virus 4 NOT DETECTED NOT DETECTED Final   Respiratory Syncytial Virus NOT DETECTED NOT DETECTED Final   Bordetella pertussis NOT DETECTED NOT DETECTED Final   Bordetella Parapertussis NOT DETECTED NOT DETECTED Final   Chlamydophila pneumoniae NOT DETECTED NOT DETECTED Final   Mycoplasma pneumoniae NOT DETECTED NOT DETECTED Final    Comment: Performed at [facility], [address], [place], [place] [address]  Culture, blood (Routine X 2) w Reflex to ID Panel     Status: None (Preliminary result)    Collection Time: 04/28/23  8:48 PM   Specimen: BLOOD RIGHT HAND  Result Value Ref Range Status   Specimen Description BLOOD RIGHT HAND  Final   Special Requests   Final    BOTTLES DRAWN AEROBIC AND ANAEROBIC Blood Culture adequate volume   Culture   Final    NO GROWTH 2 DAYS Performed at [facility], [address] [place], [place] [address]    Report Status PENDING  Incomplete  Culture, blood (Routine X 2) w Reflex to ID Panel     Status: None (Preliminary result)   Collection Time: 04/28/23  8:54 PM   Specimen: BLOOD  Result Value Ref Range Status   Specimen Description BLOOD BLOOD LEFT HAND  Final   Special Requests   Final    BOTTLES DRAWN AEROBIC AND ANAEROBIC Blood Culture adequate volume   Culture   Final    NO GROWTH 2 DAYS Performed at [facility], [address], [place], [place] [address]    Report Status PENDING  Incomplete      Radiology Studies: DG Chest Port 1 View Result Date: 04/28/2023 CLINICAL DATA:  5107 Atrial fibrillation (HCC) 5107 EXAM: PORTABLE CHEST 1 VIEW COMPARISON:  06/26/2022 FINDINGS: Stable cardiomegaly. Aortic atherosclerosis. Coarsened interstitial markings with streaky interstitial opacities in the left mid to lower lung zones and at the right lung base. Possible small left pleural effusion. No pneumothorax. IMPRESSION: Coarsened interstitial markings with streaky interstitial opacities in the left mid to lower lung zones and at the right lung base. Findings may reflect atelectasis versus edema. Electronically Signed   By: Duanne Guess D.O.   On:  04/28/2023 17:34     Scheduled Meds:  apixaban  2.5 mg Oral BID   [START ON 05/01/2023] digoxin  0.125 mg Oral Q M,W,F   metoprolol  200 mg Oral Daily   pantoprazole  20 mg Oral Daily   sodium chloride flush  3 mL Intravenous Q12H   torsemide  10 mg Oral Daily   venlafaxine XR  75 mg Oral q morning   Continuous Infusions:  azithromycin 500 mg (04/29/23 2203)   cefTRIAXone  (ROCEPHIN)  IV 1 g (04/29/23 2038)     LOS: 2 days    Time spent: 35 minutes   Sharlene Dory, DO [facility] 04/30/2023, 12:14 PM   Available via Epic secure chat 7am-7pm After these hours, please refer to coverage provider listed on [email or web address]

## 2023-04-30 NOTE — Evaluation (Signed)
Physical Therapy Evaluation Patient Details Name: [name] MRN: [id number] DOB: [birth year] Today's Date: 04/30/2023  History of Present Illness  Pt is a 89 y.o. female who presented to [facility] ED on 04/28/23 with c/o fatigue and SOB, particularly on exertion, for the past couple weeks that was worsening. Pt found to be in A-fib with RVR and with concern for pneumonia and with O2 sat dropping to 76% on RA with excertion in ED. PMH: AFib, CHF, DJD, depression/anxiety, IBS, osteoporosis, sciatica, L hemi hip arthroplasty (2021), R tibia fx with ORIF (06/2022)   Clinical Impression  Pt presents with condition above and deficits mentioned below, see PT Problem List. PTA, she was mod I utilizing a RW, living with her family in a 1-level house with a ramped entrance option. She has 24/7 care available at d/c. Currently, she presents with generalized weakness and deficits in activity tolerance, endurance, and balance. She is at risk for falls. She tends to compensate for leg weakness, reporting a hx of knee buckling, by flexing her trunk and hyperextending her knees during stance phase when ambulating. Pt required CGA-supervision for safety with all functional mobility, including ambulating up to ~92 ft today. The pt has good support at d/c and does not seem far from her baseline, thus recommending pt return home with HHPT follow-up. Will continue to follow acutely.        If plan is discharge home, recommend the following: A little help with bathing/dressing/bathroom;Assistance with cooking/housework;Assist for transportation   Can travel by private vehicle        Equipment Recommendations None recommended by PT  Recommendations for Other Services       Functional Status Assessment Patient has had a recent decline in their functional status and demonstrates the ability to make significant improvements in function in a reasonable and predictable amount of time.     Precautions / Restrictions  Precautions Precautions: Fall;Other (comment) Precaution Comments: watch O2 and HR Restrictions Weight Bearing Restrictions Per Provider Order: No      Mobility  Bed Mobility Overal bed mobility: Needs Assistance Bed Mobility: Supine to Sit, Sit to Supine     Supine to sit: Supervision, HOB elevated, Used rails Sit to supine: Supervision, HOB elevated   General bed mobility comments: Supervision for safety    Transfers Overall transfer level: Needs assistance Equipment used: Rolling walker (2 wheels) Transfers: Sit to/from Stand Sit to Stand: Contact guard assist           General transfer comment: No LOB, CGA for safety    Ambulation/Gait Ambulation/Gait assistance: Contact guard assist Gait Distance (Feet): 92 Feet (x2 bouts of ~92 ft > ~5 ft) Assistive device: Rolling walker (2 wheels) Gait Pattern/deviations: Step-through pattern, Decreased stride length, Trunk flexed, Knee hyperextension - right, Knee hyperextension - left Gait velocity: reduced Gait velocity interpretation: <1.31 ft/sec, indicative of household ambulator   General Gait Details: Pt ambulates with flexed trunk and hyperextension of knees, reportedly her compensatory technique to prevent buckling. Unsteadiness noted, but no LOB, CGA for safety  Stairs            Wheelchair Mobility     Tilt Bed    Modified Rankin (Stroke Patients Only)       Balance Overall balance assessment: Needs assistance Sitting-balance support: No upper extremity supported, Feet supported Sitting balance-Leahy Scale: Good     Standing balance support: Single extremity supported, Bilateral upper extremity supported, No upper extremity supported, During functional activity Standing balance-Leahy Scale: Fair  Standing balance comment: Able to stand and reach off COG moderately without LOB or UE support, CGA for safety. Benefits from RW to ambulate though                             Pertinent  Vitals/Pain      Home Living Family/patient expects to be discharged to:: Private residence Living Arrangements: Children (apartment connected to daughter and SIL's home) Available Help at Discharge: Family;Available 24 hours/day Type of Home: House Home Access: Stairs to enter;Ramped entrance   Entrance Stairs-Number of Steps: typically uses ramp at front door   Home Layout: One level Home Equipment: Agricultural consultant (2 wheels);Rollator (4 wheels);Wheelchair - manual;Transport chair;BSC/3in1;Grab bars - toilet;Grab bars - tub/shower;Hand held shower head;Shower seat (RW has a seat)      Prior Function Prior Level of Function : Independent/Modified Independent;Needs assist             Mobility Comments: At baseline, pt performs funcitonal mobility/transfers with a RW with Mod I. Pt reports a hx of falls but none in the past 6 months. Last reported fall was in 06/2022 leading to R tibia fx and R LE ORIF. ADLs Comments: At baseline, pt is Independent to Mod I with ADLs. Family assists with IADLs     Extremity/Trunk Assessment   Upper Extremity Assessment Upper Extremity Assessment: Defer to OT evaluation RUE Deficits / Details: generalized weakness; pt reports occasional numbness tingling in hand, worse on radial side and worse on Right, and occasional "catching" of finger joints with neither present this session. LUE Deficits / Details: generalized weakness; pt reports occasional numbness tingling in hand, worse on radial side and worse on Right, and occasional "catching" of finger joints with neither present this session.    Lower Extremity Assessment Lower Extremity Assessment: Generalized weakness;RLE deficits/detail;LLE deficits/detail RLE Deficits / Details: reports hx of knee replacement with poor recovery causing knee buckling; reports hx of lower leg fx LLE Deficits / Details: hx of knee replacement, reports hx of knee buckling    Cervical / Trunk Assessment Cervical /  Trunk Assessment: Normal  Communication   Communication Communication: No apparent difficulties Cueing Techniques: Verbal cues (occasional cues for hand placement/techniques with transfers)  Cognition Arousal: Alert Behavior During Therapy: WFL for tasks assessed/performed Overall Cognitive Status: Within Functional Limits for tasks assessed                                 General Comments: AAOx4 and pleasant throughout. Demonstrates ability to follow multi-step instructions consistently. Demonstrates good safety awareness and good insight into deficits.        General Comments General comments (skin integrity, edema, etc.): Pt with brief O2 desat to 89% on RA during functional mobility of approx. 30 ft. with O2 sat quickly recovering to 93% with standing rest break and otherwise >/93% on RA throughout session. Pt HR largely in the 80s to low 100s with brief elevatiion to 134 bpm during functional mobility with quick recovery to 110 bpm with standing rest break.    Exercises     Assessment/Plan    PT Assessment Patient needs continued PT services  PT Problem List Decreased strength;Decreased activity tolerance;Decreased balance;Decreased mobility       PT Treatment Interventions DME instruction;Gait training;Stair training;Functional mobility training;Therapeutic activities;Therapeutic exercise;Balance training;Neuromuscular re-education;Patient/family education    PT Goals (Current goals can be found in the  Care Plan section)  Acute Rehab PT Goals Patient Stated Goal: to improve and go home PT Goal Formulation: With patient Time For Goal Achievement: 05/14/23 Potential to Achieve Goals: Good    Frequency Min 1X/week     Co-evaluation               AM-PAC PT "6 Clicks" Mobility  Outcome Measure Help needed turning from your back to your side while in a flat bed without using bedrails?: A Little Help needed moving from lying on your back to sitting on  the side of a flat bed without using bedrails?: A Little Help needed moving to and from a bed to a chair (including a wheelchair)?: A Little Help needed standing up from a chair using your arms (e.g., wheelchair or bedside chair)?: A Little Help needed to walk in hospital room?: A Little Help needed climbing 3-5 steps with a railing? : A Little 6 Click Score: 18    End of Session Equipment Utilized During Treatment: Gait belt Activity Tolerance: Patient tolerated treatment well Patient left: in bed;with call bell/phone within reach;with bed alarm set Nurse Communication: Mobility status (NT) PT Visit Diagnosis: Unsteadiness on feet (R26.81);Other abnormalities of gait and mobility (R26.89);Muscle weakness (generalized) (M62.81);Difficulty in walking, not elsewhere classified (R26.2)    Time: 1700-1721 PT Time Calculation (min) (ACUTE ONLY): 21 min   Charges:   PT Evaluation $PT Eval Low Complexity: 1 Low   PT General Charges $$ ACUTE PT VISIT: 1 Visit         Virgil Benedict, PT, DPT Acute Rehabilitation Services  Office: [phone]   [name] 04/30/2023, 6:27 PM

## 2023-05-01 DIAGNOSIS — I4891 Unspecified atrial fibrillation: Secondary | ICD-10-CM | POA: Diagnosis not present

## 2023-05-01 LAB — CBC
HCT: 36.1 % (ref 36.0–46.0)
Hemoglobin: 11.6 g/dL — ABNORMAL LOW (ref 12.0–15.0)
MCH: 31.7 pg (ref 26.0–34.0)
MCHC: 32.1 g/dL (ref 30.0–36.0)
MCV: 98.6 fL (ref 80.0–100.0)
Platelets: 190 10*3/uL (ref 150–400)
RBC: 3.66 MIL/uL — ABNORMAL LOW (ref 3.87–5.11)
RDW: 14.3 % (ref 11.5–15.5)
WBC: 6.7 10*3/uL (ref 4.0–10.5)
nRBC: 0 % (ref 0.0–0.2)

## 2023-05-01 LAB — BASIC METABOLIC PANEL
Anion gap: 9 (ref 5–15)
BUN: 14 mg/dL (ref 8–23)
CO2: 24 mmol/L (ref 22–32)
Calcium: 8.8 mg/dL — ABNORMAL LOW (ref 8.9–10.3)
Chloride: 106 mmol/L (ref 98–111)
Creatinine, Ser: 0.86 mg/dL (ref 0.44–1.00)
GFR, Estimated: >60 mL/min (ref >60)
Glucose, Bld: 102 mg/dL — ABNORMAL HIGH (ref 70–99)
Potassium: 4.2 mmol/L (ref 3.5–5.1)
Sodium: 139 mmol/L (ref 135–145)

## 2023-05-01 MED ORDER — FUROSEMIDE 10 MG/ML IJ SOLN
40.0000 mg | Freq: Two times a day (BID) | INTRAMUSCULAR | Status: AC
  Administered 2023-05-01 (×2): 40 mg via INTRAVENOUS
  Filled 2023-05-01 (×2): qty 4

## 2023-05-01 MED ORDER — SODIUM CHLORIDE 0.9 % IV SOLN
1.0000 g | Freq: Once | INTRAVENOUS | Status: AC
  Administered 2023-05-01: 1 g via INTRAVENOUS
  Filled 2023-05-01: qty 10

## 2023-05-01 MED ORDER — LEVALBUTEROL HCL 0.63 MG/3ML IN NEBU
0.6300 mg | INHALATION_SOLUTION | Freq: Four times a day (QID) | RESPIRATORY_TRACT | Status: DC | PRN
  Administered 2023-05-01 (×2): 0.63 mg via RESPIRATORY_TRACT
  Filled 2023-05-01 (×3): qty 3

## 2023-05-01 NOTE — Plan of Care (Signed)

## 2023-05-01 NOTE — Progress Notes (Signed)
PROGRESS NOTE    [name]  BJY:[id number] DOB: 07-19-1930 DOA: 04/28/2023 PCP: [name], MD  89/F with chronic systolic CHF, permanent A-fib, LBBB, pulmonary hypertension, severe TR presented to the ED with weakness dyspnea on exertion for few weeks.  In the ED she was noted to be in A-fib RVR with some volume overload, x-ray also raised question of pneumonia. -Admitted, started on Cardizem gtt., antibiotics -Despite this remained persistently hypoxic   Subjective: -Still on oxygen, 3 L, dyspnea with exertion  Assessment and Plan:  A-fib with RVR -Continue Toprol, digoxin -Eliquis, heart rate improved  Acute hypoxic respiratory failure Acute on chronic systolic CHF -Last echo with EF 25-30%, mildly reduced RV, largely unchanged from prior echo 3/22 -Close she is clinically volume overloaded, I doubt pneumonia -Add IV Lasix today -Cards notes reviewed, intolerant to RAAS and poor candidate for SGLT2i with history of UTIs -Add Aldactone tomorrow if kidney function stays stable -Increase activity, PT OT, wean O2 as tolerated  Pulmonary hypertension, severe TR -Diuretics as above, conservative management with advanced age and frailty  Chronic LBBB  Anxiety, depression Continue Effexor  DJD Continue tramadol  Clinically doubt pneumonia -Day 4 of antibiotics now, discontinue after today's dose  DVT prophylaxis: Eliquis Code Status: DNR are Family Communication: No family at bedside Disposition Plan: Home likely 48 hours  Consultants:    Procedures:   Antimicrobials:    Objective: Vitals:   05/01/23 0400 05/01/23 0404 05/01/23 0500 05/01/23 0800  BP:  120/60  117/75  Pulse:  83  87  Resp:  16    Temp:  97.8 F (36.6 C)    TempSrc:  Oral    SpO2: 98% 97%    Weight:   68 kg   Height:       No intake or output data in the 24 hours ending 05/01/23 0937 Filed Weights   04/29/23 1724 04/30/23 0500 05/01/23 0500  Weight: 68.4 kg 68.1 kg 68 kg     Examination:  General exam: Appears calm and comfortable  HEENT: Positive JVD CVS: S1-S2, irregular rhythm Lungs: Few basilar Rales Abd: nondistended, soft and nontender.Normal bowel sounds heard. Central nervous system: Alert and oriented. No focal neurological deficits. Extremities: no edema Skin: No rashes Psychiatry:  Mood & affect appropriate.     Data Reviewed:   CBC: Recent Labs  Lab 04/28/23 1345 04/29/23 0520 04/30/23 0342 05/01/23 0347  WBC 6.5 6.2 7.4 6.7  HGB 12.6 11.3* 11.7* 11.6*  HCT 39.5 35.4* 36.8 36.1  MCV 97.5 98.9 98.4 98.6  PLT 230 180 184 190   Basic Metabolic Panel: Recent Labs  Lab 04/28/23 1421 04/29/23 0520 04/30/23 0342 05/01/23 0347  NA 135 137 139 139  K 4.2 4.0 3.7 4.2  CL 99 103 101 106  CO2 24 26 28 24   GLUCOSE 137* 91 108* 102*  BUN 25* 22 15 14   CREATININE 1.19* 1.07* 0.96 0.86  CALCIUM 9.4 8.9 9.4 8.8*  MG 1.8  --   --   --    GFR: Estimated Creatinine Clearance: 39.1 mL/min (by C-G formula based on SCr of 0.86 mg/dL). Liver Function Tests: Recent Labs  Lab 04/28/23 1421  AST 27  ALT 20  ALKPHOS 92  BILITOT 1.2  PROT 6.5  ALBUMIN 3.5   No results for input(s): "LIPASE", "AMYLASE" in the last 168 hours. No results for input(s): "AMMONIA" in the last 168 hours. Coagulation Profile: Recent Labs  Lab 04/28/23 1345 04/29/23 0520  INR  1.6* 1.7*   Cardiac Enzymes: No results for input(s): "CKTOTAL", "CKMB", "CKMBINDEX", "TROPONINI" in the last 168 hours. BNP (last 3 results) Recent Labs    11/15/22 1153  PROBNP 877.0*   HbA1C: No results for input(s): "HGBA1C" in the last 72 hours. CBG: No results for input(s): "GLUCAP" in the last 168 hours. Lipid Profile: No results for input(s): "CHOL", "HDL", "LDLCALC", "TRIG", "CHOLHDL", "LDLDIRECT" in the last 72 hours. Thyroid Function Tests: No results for input(s): "TSH", "T4TOTAL", "FREET4", "T3FREE", "THYROIDAB" in the last 72 hours. Anemia Panel: No  results for input(s): "VITAMINB12", "FOLATE", "FERRITIN", "TIBC", "IRON", "RETICCTPCT" in the last 72 hours. Urine analysis:    Component Value Date/Time   COLORURINE YELLOW 04/29/2023 0623   APPEARANCEUR HAZY (A) 04/29/2023 0623   LABSPEC 1.014 04/29/2023 0623   PHURINE 5.0 04/29/2023 0623   GLUCOSEU NEGATIVE 04/29/2023 0623   GLUCOSEU NEGATIVE 01/25/2021 1419   HGBUR SMALL (A) 04/29/2023 0623   HGBUR moderate 06/01/2010 1102   BILIRUBINUR NEGATIVE 04/29/2023 0623   BILIRUBINUR neg 04/22/2022 1250   KETONESUR NEGATIVE 04/29/2023 0623   PROTEINUR NEGATIVE 04/29/2023 0623   UROBILINOGEN 0.2 04/22/2022 1250   UROBILINOGEN 0.2 01/25/2021 1419   NITRITE POSITIVE (A) 04/29/2023 0623   LEUKOCYTESUR MODERATE (A) 04/29/2023 0623   Sepsis Labs: @LABRCNTIP (procalcitonin:4,lacticidven:4)  ) Recent Results (from the past 240 hours)  Resp panel by RT-PCR (RSV, Flu A&B, Covid) Anterior Nasal Swab     Status: None   Collection Time: 04/28/23  2:12 PM   Specimen: Anterior Nasal Swab  Result Value Ref Range Status   SARS Coronavirus 2 by RT PCR NEGATIVE NEGATIVE Final   Influenza A by PCR NEGATIVE NEGATIVE Final   Influenza B by PCR NEGATIVE NEGATIVE Final    Comment: (NOTE) The Xpert Xpress SARS-CoV-2/FLU/RSV plus assay is intended as an aid in the diagnosis of influenza from Nasopharyngeal swab specimens and should not be used as a sole basis for treatment. Nasal washings and aspirates are unacceptable for Xpert Xpress SARS-CoV-2/FLU/RSV testing.  Fact Sheet for Patients: BloggerCourse.com  Fact Sheet for Healthcare Providers: SeriousBroker.it  This test is not yet approved or cleared by the [place] FDA and has been authorized for detection and/or diagnosis of SARS-CoV-2 by FDA under an Emergency Use Authorization (EUA). This EUA will remain in effect (meaning this test can be used) for the duration of the COVID-19 declaration  under Section 564(b)(1) of the Act, 21 U.S.C. section 360bbb-3(b)(1), unless the authorization is terminated or revoked.     Resp Syncytial Virus by PCR NEGATIVE NEGATIVE Final    Comment: (NOTE) Fact Sheet for Patients: BloggerCourse.com  Fact Sheet for Healthcare Providers: SeriousBroker.it  This test is not yet approved or cleared by the [place] FDA and has been authorized for detection and/or diagnosis of SARS-CoV-2 by FDA under an Emergency Use Authorization (EUA). This EUA will remain in effect (meaning this test can be used) for the duration of the COVID-19 declaration under Section 564(b)(1) of the Act, 21 U.S.C. section 360bbb-3(b)(1), unless the authorization is terminated or revoked.  Performed at [facility], [address], [place], [place] [address]   Respiratory (~20 pathogens) panel by PCR     Status: None   Collection Time: 04/28/23  2:12 PM   Specimen: Nasopharyngeal Swab; Respiratory  Result Value Ref Range Status   Adenovirus NOT DETECTED NOT DETECTED Final   Coronavirus 229E NOT DETECTED NOT DETECTED Final    Comment: (NOTE) The Coronavirus on the Respiratory Panel,  DOES NOT test for the novel  Coronavirus (2019 nCoV)    Coronavirus HKU1 NOT DETECTED NOT DETECTED Final   Coronavirus NL63 NOT DETECTED NOT DETECTED Final   Coronavirus OC43 NOT DETECTED NOT DETECTED Final   Metapneumovirus NOT DETECTED NOT DETECTED Final   Rhinovirus / Enterovirus NOT DETECTED NOT DETECTED Final   Influenza A NOT DETECTED NOT DETECTED Final   Influenza B NOT DETECTED NOT DETECTED Final   Parainfluenza Virus 1 NOT DETECTED NOT DETECTED Final   Parainfluenza Virus 2 NOT DETECTED NOT DETECTED Final   Parainfluenza Virus 3 NOT DETECTED NOT DETECTED Final   Parainfluenza Virus 4 NOT DETECTED NOT DETECTED Final   Respiratory Syncytial Virus NOT DETECTED NOT DETECTED Final   Bordetella pertussis NOT DETECTED NOT  DETECTED Final   Bordetella Parapertussis NOT DETECTED NOT DETECTED Final   Chlamydophila pneumoniae NOT DETECTED NOT DETECTED Final   Mycoplasma pneumoniae NOT DETECTED NOT DETECTED Final    Comment: Performed at [facility], [address] [place], [place] [address]  Culture, blood (Routine X 2) w Reflex to ID Panel     Status: None (Preliminary result)   Collection Time: 04/28/23  8:48 PM   Specimen: BLOOD RIGHT HAND  Result Value Ref Range Status   Specimen Description BLOOD RIGHT HAND  Final   Special Requests   Final    BOTTLES DRAWN AEROBIC AND ANAEROBIC Blood Culture adequate volume   Culture   Final    NO GROWTH 2 DAYS Performed at [facility], [address], [place], [place] [address]    Report Status PENDING  Incomplete  Culture, blood (Routine X 2) w Reflex to ID Panel     Status: None (Preliminary result)   Collection Time: 04/28/23  8:54 PM   Specimen: BLOOD  Result Value Ref Range Status   Specimen Description BLOOD BLOOD LEFT HAND  Final   Special Requests   Final    BOTTLES DRAWN AEROBIC AND ANAEROBIC Blood Culture adequate volume   Culture   Final    NO GROWTH 2 DAYS Performed at [facility], [address], [place], [place] [address]    Report Status PENDING  Incomplete     Radiology Studies: DG CHEST PORT 1 VIEW Result Date: 04/30/2023 CLINICAL DATA:  Acute hypoxemic respiratory failure EXAM: PORTABLE CHEST 1 VIEW COMPARISON:  04/28/2023 FINDINGS: Single frontal view of the chest demonstrates persistent enlargement of the cardiac silhouette. Since the prior exam, there is increased pulmonary vascular congestion as well as progression of interstitial and ground-glass opacities throughout the lungs consistent with worsening volume status and developing edema. Small left pleural effusion versus left pleural thickening. No pneumothorax. No acute fractures. IMPRESSION: 1. Worsening volume status, with mild congestive heart failure and developing  pulmonary edema. Electronically Signed   By: Sharlet Salina M.D.   On: 04/30/2023 15:30     Scheduled Meds:  apixaban  2.5 mg Oral BID   digoxin  0.125 mg Oral Q M,W,F   furosemide  40 mg Intravenous BID   metoprolol  200 mg Oral Daily   pantoprazole  20 mg Oral Daily   potassium chloride  40 mEq Oral Daily   sodium chloride flush  3 mL Intravenous Q12H   venlafaxine XR  75 mg Oral q morning   Continuous Infusions:  cefTRIAXone (ROCEPHIN)  IV 1 g (04/30/23 2056)     LOS: 3 days    Time spent:    Zannie Cove,  MD [facility]   05/01/2023, 9:37 AM

## 2023-05-01 NOTE — Plan of Care (Signed)

## 2023-05-01 NOTE — Progress Notes (Signed)
Heart Failure Navigator Progress Note  Assessed for Heart & Vascular TOC clinic readiness.  Patient will follow up with [facility] per Dr. Jomarie Longs No HF TOC. .   Navigator will sign off at this time.   Rhae Hammock, BSN, Scientist, clinical (histocompatibility and immunogenetics) Only

## 2023-05-01 NOTE — Progress Notes (Signed)
Mobility Specialist Progress Note;   05/01/23 1430  Mobility  Activity Ambulated with assistance in hallway  Level of Assistance Contact guard assist, steadying assist  Assistive Device Front wheel walker  Distance Ambulated (ft) 75 ft  Activity Response Tolerated well  Mobility Referral Yes  Mobility visit 1 Mobility  Mobility Specialist Start Time (ACUTE ONLY) 1430  Mobility Specialist Stop Time (ACUTE ONLY) 1445  Mobility Specialist Time Calculation (min) (ACUTE ONLY) 15 min   Pt agreeable to mobility. On 2LO2 upon arrival. Required MinG assistance throughout ambulation for safety. Ambulated on 2LO2, VSS throughout. HR up to 117 w/ activity. C/o legs feeling weaker w/ further distance. Pt returned back to bed with all needs met, on 2LO2. Family in room.   Caesar Bookman Mobility Specialist Please contact via SecureChat or [employer] [phone]

## 2023-05-02 DIAGNOSIS — I4891 Unspecified atrial fibrillation: Secondary | ICD-10-CM | POA: Diagnosis not present

## 2023-05-02 LAB — BASIC METABOLIC PANEL
Anion gap: 8 (ref 5–15)
BUN: 16 mg/dL (ref 8–23)
CO2: 26 mmol/L (ref 22–32)
Calcium: 8.8 mg/dL — ABNORMAL LOW (ref 8.9–10.3)
Chloride: 102 mmol/L (ref 98–111)
Creatinine, Ser: 0.9 mg/dL (ref 0.44–1.00)
GFR, Estimated: >60 mL/min (ref >60)
Glucose, Bld: 92 mg/dL (ref 70–99)
Potassium: 3.6 mmol/L (ref 3.5–5.1)
Sodium: 136 mmol/L (ref 135–145)

## 2023-05-02 LAB — CBC
HCT: 37.6 % (ref 36.0–46.0)
Hemoglobin: 12 g/dL (ref 12.0–15.0)
MCH: 31.1 pg (ref 26.0–34.0)
MCHC: 31.9 g/dL (ref 30.0–36.0)
MCV: 97.4 fL (ref 80.0–100.0)
Platelets: 186 10*3/uL (ref 150–400)
RBC: 3.86 MIL/uL — ABNORMAL LOW (ref 3.87–5.11)
RDW: 14 % (ref 11.5–15.5)
WBC: 6.1 10*3/uL (ref 4.0–10.5)
nRBC: 0 % (ref 0.0–0.2)

## 2023-05-02 MED ORDER — FUROSEMIDE 10 MG/ML IJ SOLN
40.0000 mg | Freq: Two times a day (BID) | INTRAMUSCULAR | Status: DC
  Administered 2023-05-02: 40 mg via INTRAVENOUS
  Filled 2023-05-02: qty 4

## 2023-05-02 MED ORDER — TORSEMIDE 20 MG PO TABS: 20.0000 mg | ORAL_TABLET | Freq: Every day | ORAL | 1 refills | Status: DC

## 2023-05-02 NOTE — TOC Initial Note (Signed)
Transition of Care [facility]) - Initial/Assessment Note    Patient Details  Name: [name] MRN: [id number] Date of Birth: 01-Aug-1930  Transition of [facility]) CM/SW Contact:    Gala Lewandowsky, RN Phone Number: 05/02/2023, 11:03 AM  Clinical Narrative: Patient presented for Atrial Fibrillation, shortness of breath and fatigue. PTA patient was from home with support of daughter. Patient states she has DME rolling walker, wheelchair, and bedside commode in the home. Case Manager discussed home health PT/OT with the patient and she is agreeable to services. Medicare.gov list provided to the patient and she chose [facility]. Referral made to [facility] and they can service the patient within 24-48 hours post transition home. Daughter in the room and can transport patient home via private vehicle. No further needs identified at this time.                    Expected Discharge Plan: Home w Home Health Services Barriers to Discharge: No Barriers Identified   Patient Goals and CMS Choice Patient states their goals for this hospitalization and ongoing recovery are:: to return home with home health services.   Choice offered to / list presented to : Patient, Adult Children     Expected Discharge Plan and Services In-house Referral: NA Discharge Planning Services: CM Consult Post Acute Care Choice: Home Health Living arrangements for the past 2 months: Single Family Home Expected Discharge Date: 05/02/23                   HH Arranged: PT, OT HH Agency: [facility] Date HH Agency Contacted: 05/02/23 Time HH Agency Contacted: 1103 Representative spoke with at [facility] Agency: Haywood Lasso  Prior Living Arrangements/Services Living arrangements for the past 2 months: Single Family Home Lives with:: Adult Children Patient language and need for interpreter reviewed:: Yes Do you feel safe going back to the place where you live?: Yes      Need for Family  Participation in Patient Care: Yes (Comment) Care giver support system in place?: Yes (comment) Current home services: DME (rolling walker, wheelchair, and bedside commode.) Criminal Activity/Legal Involvement Pertinent to Current Situation/Hospitalization: No - Comment as needed  Activities of Daily Living   ADL Screening (condition at time of admission) Independently performs ADLs?: Yes (appropriate for developmental age) Is the patient deaf or have difficulty hearing?: Yes Does the patient have difficulty seeing, even when wearing glasses/contacts?: No Does the patient have difficulty concentrating, remembering, or making decisions?: No  Permission Sought/Granted Permission sought to share information with : Family Supports, Magazine features editor, Case Estate manager/land agent granted to share information with : Yes, Verbal Permission Granted     Permission granted to share info w AGENCY: [facility]        Emotional Assessment Appearance:: Appears stated age Attitude/Demeanor/Rapport: Engaged Affect (typically observed): Appropriate Orientation: : Oriented to Self, Oriented to  Time, Oriented to Situation, Oriented to Place Alcohol / Substance Use: Not Applicable Psych Involvement: No (comment)  Admission diagnosis:  A-fib (HCC) [I48.91] Atrial fibrillation with RVR (HCC) [I48.91] Patient Active Problem List   Diagnosis Date Noted   Acute respiratory failure (HCC) 04/29/2023   CHF (congestive heart failure) (HCC) 04/28/2023   CAP (community acquired pneumonia) 04/28/2023   Pressure ulcer 08/10/2022   Tibia/fibula fracture 06/26/2022   Fall at home, initial encounter 06/26/2022   Chronic anticoagulation 06/26/2022   Essential hypertension 06/26/2022   Paresthesia 06/19/2022   PAD (peripheral artery disease) (  HCC) 06/19/2022   Foot pain 04/24/2022   Acute cystitis with hematuria 03/29/2021   GERD (gastroesophageal reflux disease) 12/18/2020   Closed right  ankle fracture 09/22/2020   Ankle fracture, right 09/22/2020   Recurrent dislocation of patella, right knee 06/16/2020   Primary osteoarthritis of right knee 02/18/2020   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 12/03/2019   Left knee pain 12/03/2019   Moderate to severe pulmonary hypertension (HCC) 11/26/2019   Tremor of both hands 09/29/2019   Dysuria 08/06/2019   Severe tricuspid regurgitation    S/P hip hemiarthroplasty 08/04/2019   Chronic diarrhea 09/07/2017   Hoarseness of voice 09/07/2017   Total knee replacement status, left 04/25/17 05/06/2017   Primary osteoarthritis of left knee 04/25/2017   DJD (degenerative joint disease) 04/25/2017   Healthcare maintenance 02/08/2017   Vitamin D deficiency 02/08/2017   Microscopic colitis 09/27/2016   IBS (irritable bowel syndrome) 08/31/2016   At risk for falling 01/07/2016   Cystitis 07/10/2015   Chronic combined systolic and diastolic heart failure (HCC)    Rectus sheath hematoma- no anticoagulation    Atrial fibrillation with RVR- CHADs VASc=4 06/18/2015   Advance care planning 10/23/2014   Leg edema 06/27/2012   Medicare annual wellness visit, subsequent 03/27/2012   Anxiety and depression 03/05/2012   Osteoporosis 05/23/2010   KNEE PAIN, LEFT 03/02/2010   LBBB (left bundle branch block) 10/28/2008   POST MI SEPTAL DEFECT 06/24/2008   Cough 06/24/2008   Nonischemic cardiomyopathy (HCC) 05/30/2008   ATRIAL FIBRILLATION 05/30/2008   PCP:  [name], MD Pharmacy:   [id number] - [address] [place] - [address] [place] [address] Phone: [phone] Fax: [phone]  Social Drivers of Health (SDOH) Social History: SDOH Screenings   Food Insecurity: No Food Insecurity (04/30/2023)  Housing: Low Risk  (04/30/2023)  Transportation Needs: No Transportation Needs (04/30/2023)  Utilities: Not At Risk (04/30/2023)  Alcohol Screen: Low Risk  (08/11/2022)  Depression  (PHQ2-9): High Risk (12/30/2022)  Financial Resource Strain: Low Risk  (07/12/2021)  Physical Activity: Sufficiently Active (08/11/2022)  Social Connections: Moderately Isolated (04/30/2023)  Stress: No Stress Concern Present (08/11/2022)  Tobacco Use: Low Risk  (04/28/2023)   Readmission Risk Interventions     No data to display

## 2023-05-02 NOTE — Care Management Important Message (Signed)
Important Message  Patient Details  Name: [name] MRN: [id number] Date of Birth: Jun 08, 1930   Important Message Given:  Yes - Medicare IM     Dorena Bodo 05/02/2023, 10:54 AM

## 2023-05-02 NOTE — Progress Notes (Signed)
Pt refused to get up this AM for daily weight.

## 2023-05-02 NOTE — Plan of Care (Signed)

## 2023-05-02 NOTE — Discharge Summary (Signed)
Physician Discharge Summary  RYNE WILMES ZOX:[id number] DOB: [birth year] DOA: 04/28/2023  PCP: [name], MD  Admit date: 04/28/2023 Discharge date: 05/02/2023  Time spent: 45 minutes  Recommendations for Outpatient Follow-up:  [facility], Dr. Royann Shivers or APP in 2 to 3 weeks   Discharge Diagnoses:  Principal Problem:   Atrial fibrillation with RVR- CHADs VASc=4 Acute on chronic systolic CHF   Essential hypertension   Anxiety and depression   DJD (degenerative joint disease)   GERD (gastroesophageal reflux disease)   Acute respiratory failure (HCC)   Discharge Condition: Improved  Diet recommendation: Low-sodium, heart healthy  Filed Weights   04/30/23 0500 05/01/23 0500 05/02/23 0701  Weight: 68.1 kg 68 kg 67.8 kg    History of present illness:  89/F with chronic systolic CHF, permanent A-fib, LBBB, pulmonary hypertension, severe TR presented to the ED with weakness dyspnea on exertion for few weeks.  In the ED she was noted to be in A-fib RVR with some volume overload, x-ray also raised question of pneumonia. -Admitted, started on Cardizem gtt.,, diuretics  Hospital Course:   A-fib with RVR -Continue Toprol, digoxin -Eliquis, heart rate improved   Acute hypoxic respiratory failure Acute on chronic systolic CHF -Last echo with EF 25-30%, mildly reduced RV, largely unchanged from prior echo 3/22 -She was clinically volume overloaded, I doubt pneumonia -Diuresed with IV Lasix, volume status has improved -Cards notes reviewed, intolerant to RAAS and poor candidate for SGLT2i with history of UTIs -Improved, weaned off O2 discharged home on torsemide 20 Mg daily, advised regarding need for additional diuretic for weight gain -Follow-up with [facility]   Pulmonary hypertension, severe TR -Diuretics as above, conservative management with advanced age and frailty   Chronic LBBB   Anxiety, depression Continue Effexor   DJD Continue tramadol    Clinically doubt pneumonia -Was started on antibiotics initially, discontinued    Discharge Exam: Vitals:   05/02/23 0400 05/02/23 0638  BP:  108/70  Pulse:  64  Resp:  16  Temp:  98 F (36.7 C)  SpO2: 98% 98%   Gen: Awake, Alert, Oriented X 3,  HEENT: no JVD Lungs: Good air movement bilaterally, CTAB CVS: S1S2/irregular Abd: soft, Non tender, non distended, BS present Extremities: No edema Skin: no new rashes on exposed skin   Discharge Instructions   Discharge Instructions     Diet - low sodium heart healthy   Complete by: As directed    Increase activity slowly   Complete by: As directed       Allergies as of 05/02/2023       Reactions   Ace Inhibitors Cough   Warfarin Sodium Other (See Comments)   DOSE RELATED PHARMACOLOGIC EFFECT "bleed out"   Famotidine Other (See Comments)   GI upset   Vancomycin Nausea Only   Codeine Other (See Comments)   sedation   Delsym [dextromethorphan Polistirex Er] Other (See Comments)   dizziness   Lactose Intolerance (gi) Diarrhea   Lasix [furosemide] Diarrhea   Oral Lasix gives her diarrhea, tolerates IV Rx   Phenylephrine Palpitations, Other (See Comments)   Nasal spray- "likely increase in nasal congestion".    Sulfamethoxazole-trimethoprim Nausea And Vomiting   GI intolerance.        Medication List     TAKE these medications    acetaminophen 500 MG tablet Commonly known as: TYLENOL Take 2 tablets (1,000 mg total) by mouth every 8 (eight) hours as needed.   calcium carbonate 500  MG chewable tablet Commonly known as: TUMS - dosed in mg elemental calcium Chew 500 mg by mouth 2 (two) times daily as needed for indigestion or heartburn.   cholecalciferol 25 MCG (1000 UNIT) tablet Commonly known as: VITAMIN D3 Take 1,000 Units by mouth in the morning.   digoxin 0.125 MG tablet Commonly known as: LANOXIN Take 1 tablet (0.125 mg total) by mouth every Monday, Wednesday, and Friday.   Eliquis 2.5 MG Tabs  tablet Generic drug: apixaban TAKE 1 TABLET BY MOUTH TWICE A DAY   fluticasone 50 MCG/ACT nasal spray Commonly known as: FLONASE Place 2 sprays into both nostrils daily.   IVIZIA DRY EYES OP Apply 2 drops to eye Once PRN.   loperamide 2 MG capsule Commonly known as: IMODIUM Take 2 mg by mouth 3 (three) times daily as needed for diarrhea or loose stools.   loratadine 10 MG tablet Commonly known as: CLARITIN Take 10 mg by mouth daily as needed for allergies.   metoprolol 200 MG 24 hr tablet Commonly known as: Toprol XL Take 1 tablet (200 mg total) by mouth daily.   ondansetron 4 MG tablet Commonly known as: Zofran Take 1 tablet (4 mg total) by mouth every 8 (eight) hours as needed for nausea or vomiting.   pantoprazole 20 MG tablet Commonly known as: PROTONIX TAKE 1 TABLET BY MOUTH EVERY DAY   potassium chloride SA 20 MEQ tablet Commonly known as: Klor-Con M20 Take 2 tablets (40 mEq total) by mouth daily. What changed: when to take this   torsemide 20 MG tablet Commonly known as: DEMADEX Take 1 tablet (20 mg total) by mouth daily. What changed:  medication strength how much to take   traMADol 50 MG tablet Commonly known as: ULTRAM TAKE 1 TABLET BY MOUTH 3 TIMES DAILY AS NEEDED.   venlafaxine XR 37.5 MG 24 hr capsule Commonly known as: EFFEXOR-XR TAKE 2 CAPSULES (75 MG TOTAL) BY MOUTH EVERY MORNING.       Allergies  Allergen Reactions   Ace Inhibitors Cough   Warfarin Sodium Other (See Comments)    DOSE RELATED PHARMACOLOGIC EFFECT "bleed out"   Famotidine Other (See Comments)    GI upset   Vancomycin Nausea Only   Codeine Other (See Comments)    sedation   Delsym [Dextromethorphan Polistirex Er] Other (See Comments)    dizziness   Lactose Intolerance (Gi) Diarrhea   Lasix [Furosemide] Diarrhea    Oral Lasix gives her diarrhea, tolerates IV Rx   Phenylephrine Palpitations and Other (See Comments)    Nasal spray- "likely increase in nasal  congestion".    Sulfamethoxazole-Trimethoprim Nausea And Vomiting    GI intolerance.      The results of significant diagnostics from this hospitalization (including imaging, microbiology, ancillary and laboratory) are listed below for reference.    Significant Diagnostic Studies: DG CHEST PORT 1 VIEW Result Date: 04/30/2023 CLINICAL DATA:  Acute hypoxemic respiratory failure EXAM: PORTABLE CHEST 1 VIEW COMPARISON:  04/28/2023 FINDINGS: Single frontal view of the chest demonstrates persistent enlargement of the cardiac silhouette. Since the prior exam, there is increased pulmonary vascular congestion as well as progression of interstitial and ground-glass opacities throughout the lungs consistent with worsening volume status and developing edema. Small left pleural effusion versus left pleural thickening. No pneumothorax. No acute fractures. IMPRESSION: 1. Worsening volume status, with mild congestive heart failure and developing pulmonary edema. Electronically Signed   By: Sharlet Salina M.D.   On: 04/30/2023 15:30   DG Chest [facility]  1 View Result Date: 04/28/2023 CLINICAL DATA:  5107 Atrial fibrillation (HCC) 5107 EXAM: PORTABLE CHEST 1 VIEW COMPARISON:  06/26/2022 FINDINGS: Stable cardiomegaly. Aortic atherosclerosis. Coarsened interstitial markings with streaky interstitial opacities in the left mid to lower lung zones and at the right lung base. Possible small left pleural effusion. No pneumothorax. IMPRESSION: Coarsened interstitial markings with streaky interstitial opacities in the left mid to lower lung zones and at the right lung base. Findings may reflect atelectasis versus edema. Electronically Signed   By: Duanne Guess D.O.   On: 04/28/2023 17:34    Microbiology: Recent Results (from the past 240 hours)  Resp panel by RT-PCR (RSV, Flu A&B, Covid) Anterior Nasal Swab     Status: None   Collection Time: 04/28/23  2:12 PM   Specimen: Anterior Nasal Swab  Result Value Ref Range Status    SARS Coronavirus 2 by RT PCR NEGATIVE NEGATIVE Final   Influenza A by PCR NEGATIVE NEGATIVE Final   Influenza B by PCR NEGATIVE NEGATIVE Final    Comment: (NOTE) The Xpert Xpress SARS-CoV-2/FLU/RSV plus assay is intended as an aid in the diagnosis of influenza from Nasopharyngeal swab specimens and should not be used as a sole basis for treatment. Nasal washings and aspirates are unacceptable for Xpert Xpress SARS-CoV-2/FLU/RSV testing.  Fact Sheet for Patients: BloggerCourse.com  Fact Sheet for Healthcare Providers: SeriousBroker.it  This test is not yet approved or cleared by the [place] FDA and has been authorized for detection and/or diagnosis of SARS-CoV-2 by FDA under an Emergency Use Authorization (EUA). This EUA will remain in effect (meaning this test can be used) for the duration of the COVID-19 declaration under Section 564(b)(1) of the Act, 21 U.S.C. section 360bbb-3(b)(1), unless the authorization is terminated or revoked.     Resp Syncytial Virus by PCR NEGATIVE NEGATIVE Final    Comment: (NOTE) Fact Sheet for Patients: BloggerCourse.com  Fact Sheet for Healthcare Providers: SeriousBroker.it  This test is not yet approved or cleared by the [place] FDA and has been authorized for detection and/or diagnosis of SARS-CoV-2 by FDA under an Emergency Use Authorization (EUA). This EUA will remain in effect (meaning this test can be used) for the duration of the COVID-19 declaration under Section 564(b)(1) of the Act, 21 U.S.C. section 360bbb-3(b)(1), unless the authorization is terminated or revoked.  Performed at [facility], [address] [place], [place] [address]   Respiratory (~20 pathogens) panel by PCR     Status: None   Collection Time: 04/28/23  2:12 PM   Specimen: Nasopharyngeal Swab; Respiratory  Result Value Ref Range Status    Adenovirus NOT DETECTED NOT DETECTED Final   Coronavirus 229E NOT DETECTED NOT DETECTED Final    Comment: (NOTE) The Coronavirus on the Respiratory Panel, DOES NOT test for the novel  Coronavirus (2019 nCoV)    Coronavirus HKU1 NOT DETECTED NOT DETECTED Final   Coronavirus NL63 NOT DETECTED NOT DETECTED Final   Coronavirus OC43 NOT DETECTED NOT DETECTED Final   Metapneumovirus NOT DETECTED NOT DETECTED Final   Rhinovirus / Enterovirus NOT DETECTED NOT DETECTED Final   Influenza A NOT DETECTED NOT DETECTED Final   Influenza B NOT DETECTED NOT DETECTED Final   Parainfluenza Virus 1 NOT DETECTED NOT DETECTED Final   Parainfluenza Virus 2 NOT DETECTED NOT DETECTED Final   Parainfluenza Virus 3 NOT DETECTED NOT DETECTED Final   Parainfluenza Virus 4 NOT DETECTED NOT DETECTED Final   Respiratory Syncytial Virus NOT DETECTED NOT DETECTED  Final   Bordetella pertussis NOT DETECTED NOT DETECTED Final   Bordetella Parapertussis NOT DETECTED NOT DETECTED Final   Chlamydophila pneumoniae NOT DETECTED NOT DETECTED Final   Mycoplasma pneumoniae NOT DETECTED NOT DETECTED Final    Comment: Performed at [facility], [address], [place], [place] [address]  Culture, blood (Routine X 2) w Reflex to ID Panel     Status: None (Preliminary result)   Collection Time: 04/28/23  8:48 PM   Specimen: BLOOD RIGHT HAND  Result Value Ref Range Status   Specimen Description BLOOD RIGHT HAND  Final   Special Requests   Final    BOTTLES DRAWN AEROBIC AND ANAEROBIC Blood Culture adequate volume   Culture   Final    NO GROWTH 3 DAYS Performed at [facility], [address] [place], [place] [address]    Report Status PENDING  Incomplete  Culture, blood (Routine X 2) w Reflex to ID Panel     Status: None (Preliminary result)   Collection Time: 04/28/23  8:54 PM   Specimen: BLOOD  Result Value Ref Range Status   Specimen Description BLOOD BLOOD LEFT HAND  Final   Special Requests   Final     BOTTLES DRAWN AEROBIC AND ANAEROBIC Blood Culture adequate volume   Culture   Final    NO GROWTH 3 DAYS Performed at [facility], [address] [place] [address], [place], [place] [address]    Report Status PENDING  Incomplete     Labs: Basic Metabolic Panel: Recent Labs  Lab 04/28/23 1421 04/29/23 0520 04/30/23 0342 05/01/23 0347 05/02/23 0403  NA 135 137 139 139 136  K 4.2 4.0 3.7 4.2 3.6  CL 99 103 101 106 102  CO2 24 26 28 24 26   GLUCOSE 137* 91 108* 102* 92  BUN 25* 22 15 14 16   CREATININE 1.19* 1.07* 0.96 0.86 0.90  CALCIUM 9.4 8.9 9.4 8.8* 8.8*  MG 1.8  --   --   --   --    Liver Function Tests: Recent Labs  Lab 04/28/23 1421  AST 27  ALT 20  ALKPHOS 92  BILITOT 1.2  PROT 6.5  ALBUMIN 3.5   No results for input(s): "LIPASE", "AMYLASE" in the last 168 hours. No results for input(s): "AMMONIA" in the last 168 hours. CBC: Recent Labs  Lab 04/28/23 1345 04/29/23 0520 04/30/23 0342 05/01/23 0347 05/02/23 0403  WBC 6.5 6.2 7.4 6.7 6.1  HGB 12.6 11.3* 11.7* 11.6* 12.0  HCT 39.5 35.4* 36.8 36.1 37.6  MCV 97.5 98.9 98.4 98.6 97.4  PLT 230 180 184 190 186   Cardiac Enzymes: No results for input(s): "CKTOTAL", "CKMB", "CKMBINDEX", "TROPONINI" in the last 168 hours. BNP: BNP (last 3 results) Recent Labs    05/12/22 1159 06/26/22 1505 04/28/23 1421  BNP 1,425.8* 490.6* 2,815.2*    ProBNP (last 3 results) Recent Labs    11/15/22 1153  PROBNP 877.0*    CBG: No results for input(s): "GLUCAP" in the last 168 hours.     Signed:  Zannie Cove MD.  [facility] 05/02/2023, 10:16 AM

## 2023-05-03 ENCOUNTER — Telehealth: Payer: Self-pay | Admitting: *Deleted

## 2023-05-03 LAB — CULTURE, BLOOD (ROUTINE X 2)
Culture: NO GROWTH
Culture: NO GROWTH
Special Requests: ADEQUATE
Special Requests: ADEQUATE

## 2023-05-03 NOTE — Transitions of Care (Post Inpatient/ED Visit) (Signed)
05/03/2023  Name: [name] MRN: [id number] DOB: 05/08/1930  Today's TOC FU Call Status: Today's TOC FU Call Status:: Successful TOC FU Call Completed TOC FU Call Complete Date: 05/03/23 Patient's Name and Date of Birth confirmed.  Transition Care Management Follow-up Telephone Call Date of Discharge: 05/02/23 Discharge Facility: [facility]) Type of Discharge: Inpatient Admission Primary Inpatient Discharge Diagnosis:: Atrial fibrillation with RVR How have you been since you were released from the hospital?: Better (still some shortness of breath) Any questions or concerns?: No  Items Reviewed: Medications obtained,verified, and reconciled?: Yes (Medications Reviewed) Any new allergies since your discharge?: No Dietary orders reviewed?: Yes Type of Diet Ordered:: heart healthy low sodium Do you have support at home?: Yes People in Home: alone Name of Support/Comfort Primary Source: [name]  Medications Reviewed Today: Medications Reviewed Today     Reviewed by Luella Cook, RN (Case Manager) on 05/03/23 at 1348  Med List Status: <None>   Medication Order Taking? Sig Documenting Provider Last Dose Status Informant  acetaminophen (TYLENOL) 500 MG tablet [id number] Yes Take 2 tablets (1,000 mg total) by mouth every 8 (eight) hours as needed. [name], MD Taking Active   calcium carbonate (TUMS - DOSED IN MG ELEMENTAL CALCIUM) 500 MG chewable tablet [id number] Yes Chew 500 mg by mouth 2 (two) times daily as needed for indigestion or heartburn. [provider] Taking Active Self, Pharmacy Records  cholecalciferol (VITAMIN D3) 25 MCG (1000 UNIT) tablet [id number] Yes Take 1,000 Units by mouth in the morning. [provider] Taking Active Self, Pharmacy Records  digoxin (LANOXIN) 0.125 MG tablet [id number]  Take 1 tablet (0.125 mg total) by mouth every Monday, Wednesday, and Friday. Croitoru, Mihai, MD  Active            Med Note (WHITE, Elvin So    Fri Apr 28, 2023 10:42 PM) Was discontinued by provider, but d/t palpitations, 1 tablet was given to her by daughter.  ELIQUIS 2.5 MG TABS tablet [id number] Yes TAKE 1 TABLET BY MOUTH TWICE A DAY Croitoru, Mihai, MD Taking Active   fluticasone (FLONASE) 50 MCG/ACT nasal spray [id number] Yes Place 2 sprays into both nostrils daily. [name], MD Taking Active   loperamide (IMODIUM) 2 MG capsule [id number] Yes Take 2 mg by mouth 3 (three) times daily as needed for diarrhea or loose stools.  [provider] Taking Active Self, Pharmacy Records  loratadine (CLARITIN) 10 MG tablet [id number] Yes Take 10 mg by mouth daily as needed for allergies. [provider] Taking Active Self, Pharmacy Records  metoprolol (TOPROL XL) 200 MG 24 hr tablet [id number] Yes Take 1 tablet (200 mg total) by mouth daily. Marcelino Duster, PA Taking Active   ondansetron [facility]) 4 MG tablet [id number] Yes Take 1 tablet (4 mg total) by mouth every 8 (eight) hours as needed for nausea or vomiting. [name], MD Taking Active   pantoprazole (PROTONIX) 20 MG tablet [id number] Yes TAKE 1 TABLET BY MOUTH EVERY DAY [name], MD Taking Active   potassium chloride SA (KLOR-CON M20) 20 MEQ tablet [id number] Yes Take 2 tablets (40 mEq total) by mouth daily.  Patient taking differently: Take 40 mEq by mouth 2 (two) times daily.   Croitoru, Mihai, MD Taking Active   Povidone (IVIZIA DRY EYES OP) [id number] Yes Apply 2 drops to eye Once PRN. [provider] Taking Active   torsemide (DEMADEX) 20 MG tablet [id number] Yes Take 1 tablet (20 mg total)  by mouth daily. Zannie Cove, MD Taking Active   traMADol Janean Sark) 50 MG tablet [id number] Yes TAKE 1 TABLET BY MOUTH 3 TIMES DAILY AS NEEDED. [name], MD Taking Active   venlafaxine XR (EFFEXOR-XR) 37.5 MG 24 hr capsule [id number] Yes TAKE 2 CAPSULES (75 MG TOTAL) BY MOUTH EVERY MORNING. [name], MD Taking Active             Home  Care and Equipment/Supplies: Were Home Health Services Ordered?: Yes [facility] Has Agency set up a time to come to your home?: Yes First Home Health Visit Date: 05/05/23 Any new equipment or medical supplies ordered?: NA  Functional Questionnaire: Do you need assistance with bathing/showering or dressing?: No Do you need assistance with meal preparation?: No Do you need assistance with eating?: No Do you have difficulty maintaining continence: No Do you need assistance with getting out of bed/getting out of a chair/moving?: No Do you have difficulty managing or taking your medications?: Yes  Follow up appointments reviewed: PCP Follow-up appointment confirmed?: NA Specialist Hospital Follow-up appointment confirmed?: Yes Date of Specialist follow-up appointment?: 06/01/23 Follow-Up Specialty Provider:: Micah Flesher Do you need transportation to your follow-up appointment?: No Do you understand care options if your condition(s) worsen?: Yes-patient verbalized understanding  SDOH Interventions Today    Flowsheet Row Most Recent Value  SDOH Interventions   Food Insecurity Interventions Intervention Not Indicated  Housing Interventions Intervention Not Indicated  Transportation Interventions Intervention Not Indicated, Patient Resources (Friends/Family)  Utilities Interventions Intervention Not Indicated      Interventions Today    Flowsheet Row Most Recent Value  Chronic Disease   Chronic disease during today's visit Atrial Fibrillation (AFib)  General Interventions   General Interventions Discussed/Reviewed General Interventions Discussed, General Interventions Reviewed, Doctor Visits  [Declined referral for Case Care Manager services]  Doctor Visits Discussed/Reviewed Doctor Visits Discussed, Doctor Visits Reviewed  Education Interventions   Education Provided Provided Education  Provided Verbal Education On Nutrition, Insurance Plans  [RN  discussed [employer] meal plan and instructed patient how to request]  Nutrition Interventions   Nutrition Discussed/Reviewed Nutrition Discussed, Nutrition Reviewed, Decreasing salt  Pharmacy Interventions   Pharmacy Dicussed/Reviewed Pharmacy Topics Discussed, Pharmacy Topics Reviewed      Patient declined Case Care Manager services RN instructed patient how to initiate [facility] meal plan  Gean Maidens BSN RN [facility] Care Management Coordinator [email or web address] Direct Dial: [phone]  Fax: [phone] Website: [email or web address]

## 2023-05-05 DIAGNOSIS — I447 Left bundle-branch block, unspecified: Secondary | ICD-10-CM | POA: Diagnosis not present

## 2023-05-05 DIAGNOSIS — G629 Polyneuropathy, unspecified: Secondary | ICD-10-CM | POA: Diagnosis not present

## 2023-05-05 DIAGNOSIS — I5043 Acute on chronic combined systolic (congestive) and diastolic (congestive) heart failure: Secondary | ICD-10-CM | POA: Diagnosis not present

## 2023-05-05 DIAGNOSIS — I739 Peripheral vascular disease, unspecified: Secondary | ICD-10-CM | POA: Diagnosis not present

## 2023-05-05 DIAGNOSIS — G4700 Insomnia, unspecified: Secondary | ICD-10-CM | POA: Diagnosis not present

## 2023-05-05 DIAGNOSIS — J9600 Acute respiratory failure, unspecified whether with hypoxia or hypercapnia: Secondary | ICD-10-CM | POA: Diagnosis not present

## 2023-05-05 DIAGNOSIS — Z7901 Long term (current) use of anticoagulants: Secondary | ICD-10-CM | POA: Diagnosis not present

## 2023-05-05 DIAGNOSIS — I4891 Unspecified atrial fibrillation: Secondary | ICD-10-CM | POA: Diagnosis not present

## 2023-05-05 DIAGNOSIS — M810 Age-related osteoporosis without current pathological fracture: Secondary | ICD-10-CM | POA: Diagnosis not present

## 2023-05-05 DIAGNOSIS — R1310 Dysphagia, unspecified: Secondary | ICD-10-CM | POA: Diagnosis not present

## 2023-05-05 DIAGNOSIS — I071 Rheumatic tricuspid insufficiency: Secondary | ICD-10-CM | POA: Diagnosis not present

## 2023-05-05 DIAGNOSIS — R251 Tremor, unspecified: Secondary | ICD-10-CM | POA: Diagnosis not present

## 2023-05-05 DIAGNOSIS — D649 Anemia, unspecified: Secondary | ICD-10-CM | POA: Diagnosis not present

## 2023-05-05 DIAGNOSIS — I272 Pulmonary hypertension, unspecified: Secondary | ICD-10-CM | POA: Diagnosis not present

## 2023-05-05 DIAGNOSIS — I428 Other cardiomyopathies: Secondary | ICD-10-CM | POA: Diagnosis not present

## 2023-05-05 DIAGNOSIS — K219 Gastro-esophageal reflux disease without esophagitis: Secondary | ICD-10-CM | POA: Diagnosis not present

## 2023-05-05 DIAGNOSIS — Z7951 Long term (current) use of inhaled steroids: Secondary | ICD-10-CM | POA: Diagnosis not present

## 2023-05-05 DIAGNOSIS — K589 Irritable bowel syndrome without diarrhea: Secondary | ICD-10-CM | POA: Diagnosis not present

## 2023-05-05 DIAGNOSIS — K5730 Diverticulosis of large intestine without perforation or abscess without bleeding: Secondary | ICD-10-CM | POA: Diagnosis not present

## 2023-05-05 DIAGNOSIS — M1990 Unspecified osteoarthritis, unspecified site: Secondary | ICD-10-CM | POA: Diagnosis not present

## 2023-05-05 DIAGNOSIS — G8929 Other chronic pain: Secondary | ICD-10-CM | POA: Diagnosis not present

## 2023-05-10 ENCOUNTER — Other Ambulatory Visit: Payer: Self-pay | Admitting: Cardiovascular Disease

## 2023-05-10 DIAGNOSIS — I4821 Permanent atrial fibrillation: Secondary | ICD-10-CM

## 2023-05-10 DIAGNOSIS — I4891 Unspecified atrial fibrillation: Secondary | ICD-10-CM

## 2023-05-10 NOTE — Telephone Encounter (Signed)
Eliquis 2.5mg  refill request received. Patient is 89 years old, weight-67.8kg, Crea-0.90 on 05/02/23, Diagnosis-Afib, and last seen by Dr. Kirke Corin on 04/27/23. Dose is inappropriate based on dosing criteria-see below per MD order for eliquis 2.5mg  twice a day.  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 Anticoagulation: He had a serious fall a few months ago, but none since.  No overt bleeding problems.  Eliquis dose per Dr. Royann Shivers states:  April 21, 2021 Thurmon Fair, MD to Cheree Ditto, [facility]    04/21/21  1:53 PM  Yes, I would rather keep her on the 2.5 mg dose. Thanks  April 13, 2021   04/13/21  8:13 AM Cheree Ditto, RPH routed this conversation to Thurmon Fair, MD Cheree Ditto, [facility]     04/13/21  8:09 AM Note Prescription refill request for Eliquis received. Indication: a fib Last office visit: 03/10/21 Scr: 0.72 Age: 38 Weight: 67kg   Dr C, patient is fall risk. You have her on Eliquis 2.5mg  but actually qualifies for 5mg  BID. Continue low dose?

## 2023-05-11 DIAGNOSIS — G629 Polyneuropathy, unspecified: Secondary | ICD-10-CM

## 2023-05-11 DIAGNOSIS — I5043 Acute on chronic combined systolic (congestive) and diastolic (congestive) heart failure: Secondary | ICD-10-CM

## 2023-05-11 DIAGNOSIS — D649 Anemia, unspecified: Secondary | ICD-10-CM

## 2023-05-11 DIAGNOSIS — F32A Depression, unspecified: Secondary | ICD-10-CM

## 2023-05-11 DIAGNOSIS — K219 Gastro-esophageal reflux disease without esophagitis: Secondary | ICD-10-CM

## 2023-05-11 DIAGNOSIS — K589 Irritable bowel syndrome without diarrhea: Secondary | ICD-10-CM

## 2023-05-11 DIAGNOSIS — F419 Anxiety disorder, unspecified: Secondary | ICD-10-CM

## 2023-05-11 DIAGNOSIS — J189 Pneumonia, unspecified organism: Secondary | ICD-10-CM

## 2023-05-11 DIAGNOSIS — I4891 Unspecified atrial fibrillation: Secondary | ICD-10-CM

## 2023-05-11 DIAGNOSIS — J9600 Acute respiratory failure, unspecified whether with hypoxia or hypercapnia: Secondary | ICD-10-CM

## 2023-05-11 DIAGNOSIS — I739 Peripheral vascular disease, unspecified: Secondary | ICD-10-CM

## 2023-05-11 DIAGNOSIS — R1310 Dysphagia, unspecified: Secondary | ICD-10-CM

## 2023-05-12 ENCOUNTER — Ambulatory Visit (INDEPENDENT_AMBULATORY_CARE_PROVIDER_SITE_OTHER): Payer: Medicare Other | Admitting: Family Medicine

## 2023-05-12 ENCOUNTER — Encounter: Payer: Self-pay | Admitting: Family Medicine

## 2023-05-12 VITALS — BP 112/78 | HR 74 | Temp 97.5°F | Ht 65.0 in

## 2023-05-12 DIAGNOSIS — I4891 Unspecified atrial fibrillation: Secondary | ICD-10-CM

## 2023-05-12 DIAGNOSIS — F32A Depression, unspecified: Secondary | ICD-10-CM | POA: Diagnosis not present

## 2023-05-12 DIAGNOSIS — F419 Anxiety disorder, unspecified: Secondary | ICD-10-CM

## 2023-05-12 DIAGNOSIS — I4821 Permanent atrial fibrillation: Secondary | ICD-10-CM

## 2023-05-12 DIAGNOSIS — R1310 Dysphagia, unspecified: Secondary | ICD-10-CM | POA: Diagnosis not present

## 2023-05-12 LAB — CBC WITH DIFFERENTIAL/PLATELET
Basophils Absolute: 0 10*3/uL (ref 0.0–0.1)
Basophils Relative: 0.5 % (ref 0.0–3.0)
Eosinophils Absolute: 0.1 10*3/uL (ref 0.0–0.7)
Eosinophils Relative: 1.7 % (ref 0.0–5.0)
HCT: 42.6 % (ref 36.0–46.0)
Hemoglobin: 14 g/dL (ref 12.0–15.0)
Lymphocytes Relative: 25.1 % (ref 12.0–46.0)
Lymphs Abs: 1.8 10*3/uL (ref 0.7–4.0)
MCHC: 32.7 g/dL (ref 30.0–36.0)
MCV: 97.3 fL (ref 78.0–100.0)
Monocytes Absolute: 0.5 10*3/uL (ref 0.1–1.0)
Monocytes Relative: 7.7 % (ref 3.0–12.0)
Neutro Abs: 4.6 10*3/uL (ref 1.4–7.7)
Neutrophils Relative %: 65 % (ref 43.0–77.0)
Platelets: 220 10*3/uL (ref 150.0–400.0)
RBC: 4.38 Mil/uL (ref 3.87–5.11)
RDW: 13.9 % (ref 11.5–15.5)
WBC: 7.1 10*3/uL (ref 4.0–10.5)

## 2023-05-12 LAB — COMPREHENSIVE METABOLIC PANEL
ALT: 10 U/L (ref 0–35)
AST: 24 U/L (ref 0–37)
Albumin: 4.1 g/dL (ref 3.5–5.2)
Alkaline Phosphatase: 84 U/L (ref 39–117)
BUN: 48 mg/dL — ABNORMAL HIGH (ref 6–23)
CO2: 32 meq/L (ref 19–32)
Calcium: 9.5 mg/dL (ref 8.4–10.5)
Chloride: 99 meq/L (ref 96–112)
Creatinine, Ser: 0.91 mg/dL (ref 0.40–1.20)
GFR: 55.65 mL/min — ABNORMAL LOW (ref >60.00)
Glucose, Bld: 104 mg/dL — ABNORMAL HIGH (ref 70–99)
Potassium: 4.3 meq/L (ref 3.5–5.1)
Sodium: 138 meq/L (ref 135–145)
Total Bilirubin: 0.8 mg/dL (ref 0.2–1.2)
Total Protein: 7.1 g/dL (ref 6.0–8.3)

## 2023-05-12 NOTE — Patient Instructions (Addendum)
Don't change your meds for now. Go to the lab on the way out.   If you have mychart we'll likely use that to update you.    Take care.  Glad to see you. 

## 2023-05-12 NOTE — Progress Notes (Unsigned)
    Recommendations for Outpatient Follow-up:  [facility], Dr. Royann Shivers or APP in 2 to 3 weeks     Discharge Diagnoses:  Principal Problem:   Atrial fibrillation with RVR- CHADs VASc=4 Acute on chronic systolic CHF   Essential hypertension   Anxiety and depression   DJD (degenerative joint disease)   GERD (gastroesophageal reflux disease)   Acute respiratory failure (HCC)     Discharge Condition: Improved   Diet recommendation: Low-sodium, heart healthy        Filed Weights    04/30/23 0500 05/01/23 0500 05/02/23 0701  Weight: 68.1 kg 68 kg 67.8 kg      History of present illness:  89/F with chronic systolic CHF, permanent A-fib, LBBB, pulmonary hypertension, severe TR presented to the ED with weakness dyspnea on exertion for few weeks.  In the ED she was noted to be in A-fib RVR with some volume overload, x-ray also raised question of pneumonia. -Admitted, started on Cardizem gtt.,, diuretics   Hospital Course:    A-fib with RVR -Continue Toprol, digoxin -Eliquis, heart rate improved   Acute hypoxic respiratory failure Acute on chronic systolic CHF -Last echo with EF 25-30%, mildly reduced RV, largely unchanged from prior echo 3/22 -She was clinically volume overloaded, I doubt pneumonia -Diuresed with IV Lasix, volume status has improved -Cards notes reviewed, intolerant to RAAS and poor candidate for SGLT2i with history of UTIs -Improved, weaned off O2 discharged home on torsemide 20 Mg daily, advised regarding need for additional diuretic for weight gain -Follow-up with [facility]   Pulmonary hypertension, severe TR -Diuretics as above, conservative management with advanced age and frailty   Chronic LBBB   Anxiety, depression Continue Effexor   DJD Continue tramadol   Clinically doubt pneumonia -Was started on antibiotics initially, discontinued   =========== Inpatient f/u.  Weight is lower in the meantime.  SOB is better.  H/o AF with TR, now  back home.    Dysphagia.  Doing well with soft foods but more trouble meats.  She has slower upper esophageal transit.  She has to be careful with liquids.  "Has been this way awhile but worse in the last month or two."  Hasn't needed Heimlich- she can still breath during an event.  Declined ST at this point.  She can consider that.  Swallowing cautions d/w pt.    Mood d/w pt.  Still on effexor, sig upheaval with medical illness, moving to her daughter's house (daughter and SIL supportive), and death of husband.  Discussed continuing as is with exffexor given her history.    Meds, vitals, and allergies reviewed.   ROS: Per HPI unless specifically indicated in ROS section   Nad Ncat Neck supple no LA IRR not tachy Ctab Abd soft, not ttp Normal cap refill but mild purplish changes on the R>L foot (but still with normal cap refill)- this is a chronic finding for patient.

## 2023-05-14 ENCOUNTER — Encounter: Payer: Self-pay | Admitting: Family Medicine

## 2023-05-14 DIAGNOSIS — R1310 Dysphagia, unspecified: Secondary | ICD-10-CM | POA: Insufficient documentation

## 2023-05-14 NOTE — Assessment & Plan Note (Addendum)
H/o. Improved, continue as is with digoxin, eliquis, metoprolok, torsemide and K.  See notes on labs.

## 2023-05-14 NOTE — Assessment & Plan Note (Signed)
 Continue effexor

## 2023-05-14 NOTE — Assessment & Plan Note (Signed)
Declined HHST at this point.  She can consider that.  Swallowing cautions d/w pt.

## 2023-05-19 DIAGNOSIS — G8929 Other chronic pain: Secondary | ICD-10-CM | POA: Diagnosis not present

## 2023-05-19 DIAGNOSIS — I4891 Unspecified atrial fibrillation: Secondary | ICD-10-CM | POA: Diagnosis not present

## 2023-05-19 DIAGNOSIS — G629 Polyneuropathy, unspecified: Secondary | ICD-10-CM | POA: Diagnosis not present

## 2023-05-19 DIAGNOSIS — M810 Age-related osteoporosis without current pathological fracture: Secondary | ICD-10-CM | POA: Diagnosis not present

## 2023-05-19 DIAGNOSIS — I739 Peripheral vascular disease, unspecified: Secondary | ICD-10-CM | POA: Diagnosis not present

## 2023-05-19 DIAGNOSIS — Z7901 Long term (current) use of anticoagulants: Secondary | ICD-10-CM | POA: Diagnosis not present

## 2023-05-19 DIAGNOSIS — R1310 Dysphagia, unspecified: Secondary | ICD-10-CM | POA: Diagnosis not present

## 2023-05-19 DIAGNOSIS — J9600 Acute respiratory failure, unspecified whether with hypoxia or hypercapnia: Secondary | ICD-10-CM | POA: Diagnosis not present

## 2023-05-19 DIAGNOSIS — I272 Pulmonary hypertension, unspecified: Secondary | ICD-10-CM | POA: Diagnosis not present

## 2023-05-19 DIAGNOSIS — K5730 Diverticulosis of large intestine without perforation or abscess without bleeding: Secondary | ICD-10-CM | POA: Diagnosis not present

## 2023-05-19 DIAGNOSIS — D649 Anemia, unspecified: Secondary | ICD-10-CM | POA: Diagnosis not present

## 2023-05-19 DIAGNOSIS — G4700 Insomnia, unspecified: Secondary | ICD-10-CM | POA: Diagnosis not present

## 2023-05-19 DIAGNOSIS — I071 Rheumatic tricuspid insufficiency: Secondary | ICD-10-CM | POA: Diagnosis not present

## 2023-05-19 DIAGNOSIS — Z7951 Long term (current) use of inhaled steroids: Secondary | ICD-10-CM | POA: Diagnosis not present

## 2023-05-19 DIAGNOSIS — K219 Gastro-esophageal reflux disease without esophagitis: Secondary | ICD-10-CM | POA: Diagnosis not present

## 2023-05-19 DIAGNOSIS — I428 Other cardiomyopathies: Secondary | ICD-10-CM | POA: Diagnosis not present

## 2023-05-19 DIAGNOSIS — I5043 Acute on chronic combined systolic (congestive) and diastolic (congestive) heart failure: Secondary | ICD-10-CM | POA: Diagnosis not present

## 2023-05-19 DIAGNOSIS — I447 Left bundle-branch block, unspecified: Secondary | ICD-10-CM | POA: Diagnosis not present

## 2023-05-19 DIAGNOSIS — M1990 Unspecified osteoarthritis, unspecified site: Secondary | ICD-10-CM | POA: Diagnosis not present

## 2023-05-19 DIAGNOSIS — K589 Irritable bowel syndrome without diarrhea: Secondary | ICD-10-CM | POA: Diagnosis not present

## 2023-05-19 DIAGNOSIS — R251 Tremor, unspecified: Secondary | ICD-10-CM | POA: Diagnosis not present

## 2023-05-23 DIAGNOSIS — R1310 Dysphagia, unspecified: Secondary | ICD-10-CM | POA: Diagnosis not present

## 2023-05-23 DIAGNOSIS — Z7901 Long term (current) use of anticoagulants: Secondary | ICD-10-CM | POA: Diagnosis not present

## 2023-05-23 DIAGNOSIS — G629 Polyneuropathy, unspecified: Secondary | ICD-10-CM | POA: Diagnosis not present

## 2023-05-23 DIAGNOSIS — G4700 Insomnia, unspecified: Secondary | ICD-10-CM | POA: Diagnosis not present

## 2023-05-23 DIAGNOSIS — K219 Gastro-esophageal reflux disease without esophagitis: Secondary | ICD-10-CM | POA: Diagnosis not present

## 2023-05-23 DIAGNOSIS — Z7951 Long term (current) use of inhaled steroids: Secondary | ICD-10-CM | POA: Diagnosis not present

## 2023-05-23 DIAGNOSIS — M1990 Unspecified osteoarthritis, unspecified site: Secondary | ICD-10-CM | POA: Diagnosis not present

## 2023-05-23 DIAGNOSIS — R251 Tremor, unspecified: Secondary | ICD-10-CM | POA: Diagnosis not present

## 2023-05-23 DIAGNOSIS — I447 Left bundle-branch block, unspecified: Secondary | ICD-10-CM | POA: Diagnosis not present

## 2023-05-23 DIAGNOSIS — J9600 Acute respiratory failure, unspecified whether with hypoxia or hypercapnia: Secondary | ICD-10-CM | POA: Diagnosis not present

## 2023-05-23 DIAGNOSIS — M810 Age-related osteoporosis without current pathological fracture: Secondary | ICD-10-CM | POA: Diagnosis not present

## 2023-05-23 DIAGNOSIS — I5043 Acute on chronic combined systolic (congestive) and diastolic (congestive) heart failure: Secondary | ICD-10-CM | POA: Diagnosis not present

## 2023-05-23 DIAGNOSIS — G8929 Other chronic pain: Secondary | ICD-10-CM | POA: Diagnosis not present

## 2023-05-23 DIAGNOSIS — D649 Anemia, unspecified: Secondary | ICD-10-CM | POA: Diagnosis not present

## 2023-05-23 DIAGNOSIS — I428 Other cardiomyopathies: Secondary | ICD-10-CM | POA: Diagnosis not present

## 2023-05-23 DIAGNOSIS — I272 Pulmonary hypertension, unspecified: Secondary | ICD-10-CM | POA: Diagnosis not present

## 2023-05-23 DIAGNOSIS — K589 Irritable bowel syndrome without diarrhea: Secondary | ICD-10-CM | POA: Diagnosis not present

## 2023-05-23 DIAGNOSIS — I071 Rheumatic tricuspid insufficiency: Secondary | ICD-10-CM | POA: Diagnosis not present

## 2023-05-23 DIAGNOSIS — K5730 Diverticulosis of large intestine without perforation or abscess without bleeding: Secondary | ICD-10-CM | POA: Diagnosis not present

## 2023-05-23 DIAGNOSIS — I4891 Unspecified atrial fibrillation: Secondary | ICD-10-CM | POA: Diagnosis not present

## 2023-05-23 DIAGNOSIS — I739 Peripheral vascular disease, unspecified: Secondary | ICD-10-CM | POA: Diagnosis not present

## 2023-05-25 DIAGNOSIS — I071 Rheumatic tricuspid insufficiency: Secondary | ICD-10-CM | POA: Diagnosis not present

## 2023-05-25 DIAGNOSIS — K589 Irritable bowel syndrome without diarrhea: Secondary | ICD-10-CM | POA: Diagnosis not present

## 2023-05-25 DIAGNOSIS — D649 Anemia, unspecified: Secondary | ICD-10-CM | POA: Diagnosis not present

## 2023-05-25 DIAGNOSIS — M1990 Unspecified osteoarthritis, unspecified site: Secondary | ICD-10-CM | POA: Diagnosis not present

## 2023-05-25 DIAGNOSIS — Z7901 Long term (current) use of anticoagulants: Secondary | ICD-10-CM | POA: Diagnosis not present

## 2023-05-25 DIAGNOSIS — Z7951 Long term (current) use of inhaled steroids: Secondary | ICD-10-CM | POA: Diagnosis not present

## 2023-05-25 DIAGNOSIS — K5730 Diverticulosis of large intestine without perforation or abscess without bleeding: Secondary | ICD-10-CM | POA: Diagnosis not present

## 2023-05-25 DIAGNOSIS — I447 Left bundle-branch block, unspecified: Secondary | ICD-10-CM | POA: Diagnosis not present

## 2023-05-25 DIAGNOSIS — I5043 Acute on chronic combined systolic (congestive) and diastolic (congestive) heart failure: Secondary | ICD-10-CM | POA: Diagnosis not present

## 2023-05-25 DIAGNOSIS — I739 Peripheral vascular disease, unspecified: Secondary | ICD-10-CM | POA: Diagnosis not present

## 2023-05-25 DIAGNOSIS — J9600 Acute respiratory failure, unspecified whether with hypoxia or hypercapnia: Secondary | ICD-10-CM | POA: Diagnosis not present

## 2023-05-25 DIAGNOSIS — R1310 Dysphagia, unspecified: Secondary | ICD-10-CM | POA: Diagnosis not present

## 2023-05-25 DIAGNOSIS — G629 Polyneuropathy, unspecified: Secondary | ICD-10-CM | POA: Diagnosis not present

## 2023-05-25 DIAGNOSIS — G4700 Insomnia, unspecified: Secondary | ICD-10-CM | POA: Diagnosis not present

## 2023-05-25 DIAGNOSIS — I428 Other cardiomyopathies: Secondary | ICD-10-CM | POA: Diagnosis not present

## 2023-05-25 DIAGNOSIS — K219 Gastro-esophageal reflux disease without esophagitis: Secondary | ICD-10-CM | POA: Diagnosis not present

## 2023-05-25 DIAGNOSIS — R251 Tremor, unspecified: Secondary | ICD-10-CM | POA: Diagnosis not present

## 2023-05-25 DIAGNOSIS — I272 Pulmonary hypertension, unspecified: Secondary | ICD-10-CM | POA: Diagnosis not present

## 2023-05-25 DIAGNOSIS — I4891 Unspecified atrial fibrillation: Secondary | ICD-10-CM | POA: Diagnosis not present

## 2023-05-25 DIAGNOSIS — M810 Age-related osteoporosis without current pathological fracture: Secondary | ICD-10-CM | POA: Diagnosis not present

## 2023-05-25 DIAGNOSIS — G8929 Other chronic pain: Secondary | ICD-10-CM | POA: Diagnosis not present

## 2023-05-29 DIAGNOSIS — J9600 Acute respiratory failure, unspecified whether with hypoxia or hypercapnia: Secondary | ICD-10-CM | POA: Diagnosis not present

## 2023-05-29 DIAGNOSIS — M810 Age-related osteoporosis without current pathological fracture: Secondary | ICD-10-CM | POA: Diagnosis not present

## 2023-05-29 DIAGNOSIS — K589 Irritable bowel syndrome without diarrhea: Secondary | ICD-10-CM | POA: Diagnosis not present

## 2023-05-29 DIAGNOSIS — I272 Pulmonary hypertension, unspecified: Secondary | ICD-10-CM | POA: Diagnosis not present

## 2023-05-29 DIAGNOSIS — I071 Rheumatic tricuspid insufficiency: Secondary | ICD-10-CM | POA: Diagnosis not present

## 2023-05-29 DIAGNOSIS — K5730 Diverticulosis of large intestine without perforation or abscess without bleeding: Secondary | ICD-10-CM | POA: Diagnosis not present

## 2023-05-29 DIAGNOSIS — Z7901 Long term (current) use of anticoagulants: Secondary | ICD-10-CM | POA: Diagnosis not present

## 2023-05-29 DIAGNOSIS — M1990 Unspecified osteoarthritis, unspecified site: Secondary | ICD-10-CM | POA: Diagnosis not present

## 2023-05-29 DIAGNOSIS — Z7951 Long term (current) use of inhaled steroids: Secondary | ICD-10-CM | POA: Diagnosis not present

## 2023-05-29 DIAGNOSIS — I4891 Unspecified atrial fibrillation: Secondary | ICD-10-CM | POA: Diagnosis not present

## 2023-05-29 DIAGNOSIS — I739 Peripheral vascular disease, unspecified: Secondary | ICD-10-CM | POA: Diagnosis not present

## 2023-05-29 DIAGNOSIS — G4700 Insomnia, unspecified: Secondary | ICD-10-CM | POA: Diagnosis not present

## 2023-05-29 DIAGNOSIS — I428 Other cardiomyopathies: Secondary | ICD-10-CM | POA: Diagnosis not present

## 2023-05-29 DIAGNOSIS — I5043 Acute on chronic combined systolic (congestive) and diastolic (congestive) heart failure: Secondary | ICD-10-CM | POA: Diagnosis not present

## 2023-05-29 DIAGNOSIS — R251 Tremor, unspecified: Secondary | ICD-10-CM | POA: Diagnosis not present

## 2023-05-29 DIAGNOSIS — G8929 Other chronic pain: Secondary | ICD-10-CM | POA: Diagnosis not present

## 2023-05-29 DIAGNOSIS — D649 Anemia, unspecified: Secondary | ICD-10-CM | POA: Diagnosis not present

## 2023-05-29 DIAGNOSIS — K219 Gastro-esophageal reflux disease without esophagitis: Secondary | ICD-10-CM | POA: Diagnosis not present

## 2023-05-29 DIAGNOSIS — G629 Polyneuropathy, unspecified: Secondary | ICD-10-CM | POA: Diagnosis not present

## 2023-05-29 DIAGNOSIS — R1310 Dysphagia, unspecified: Secondary | ICD-10-CM | POA: Diagnosis not present

## 2023-05-29 DIAGNOSIS — I447 Left bundle-branch block, unspecified: Secondary | ICD-10-CM | POA: Diagnosis not present

## 2023-06-01 ENCOUNTER — Ambulatory Visit: Payer: Medicare Other | Admitting: Physician Assistant

## 2023-06-06 DIAGNOSIS — I447 Left bundle-branch block, unspecified: Secondary | ICD-10-CM | POA: Diagnosis not present

## 2023-06-06 DIAGNOSIS — Z7901 Long term (current) use of anticoagulants: Secondary | ICD-10-CM | POA: Diagnosis not present

## 2023-06-06 DIAGNOSIS — G8929 Other chronic pain: Secondary | ICD-10-CM | POA: Diagnosis not present

## 2023-06-06 DIAGNOSIS — R251 Tremor, unspecified: Secondary | ICD-10-CM | POA: Diagnosis not present

## 2023-06-06 DIAGNOSIS — I5043 Acute on chronic combined systolic (congestive) and diastolic (congestive) heart failure: Secondary | ICD-10-CM | POA: Diagnosis not present

## 2023-06-06 DIAGNOSIS — G4700 Insomnia, unspecified: Secondary | ICD-10-CM | POA: Diagnosis not present

## 2023-06-06 DIAGNOSIS — K5730 Diverticulosis of large intestine without perforation or abscess without bleeding: Secondary | ICD-10-CM | POA: Diagnosis not present

## 2023-06-06 DIAGNOSIS — I272 Pulmonary hypertension, unspecified: Secondary | ICD-10-CM | POA: Diagnosis not present

## 2023-06-06 DIAGNOSIS — M810 Age-related osteoporosis without current pathological fracture: Secondary | ICD-10-CM | POA: Diagnosis not present

## 2023-06-06 DIAGNOSIS — K219 Gastro-esophageal reflux disease without esophagitis: Secondary | ICD-10-CM | POA: Diagnosis not present

## 2023-06-06 DIAGNOSIS — I428 Other cardiomyopathies: Secondary | ICD-10-CM | POA: Diagnosis not present

## 2023-06-06 DIAGNOSIS — K589 Irritable bowel syndrome without diarrhea: Secondary | ICD-10-CM | POA: Diagnosis not present

## 2023-06-06 DIAGNOSIS — G629 Polyneuropathy, unspecified: Secondary | ICD-10-CM | POA: Diagnosis not present

## 2023-06-06 DIAGNOSIS — Z7951 Long term (current) use of inhaled steroids: Secondary | ICD-10-CM | POA: Diagnosis not present

## 2023-06-06 DIAGNOSIS — J9600 Acute respiratory failure, unspecified whether with hypoxia or hypercapnia: Secondary | ICD-10-CM | POA: Diagnosis not present

## 2023-06-06 DIAGNOSIS — R1310 Dysphagia, unspecified: Secondary | ICD-10-CM | POA: Diagnosis not present

## 2023-06-06 DIAGNOSIS — D649 Anemia, unspecified: Secondary | ICD-10-CM | POA: Diagnosis not present

## 2023-06-06 DIAGNOSIS — M1990 Unspecified osteoarthritis, unspecified site: Secondary | ICD-10-CM | POA: Diagnosis not present

## 2023-06-06 DIAGNOSIS — I4891 Unspecified atrial fibrillation: Secondary | ICD-10-CM | POA: Diagnosis not present

## 2023-06-06 DIAGNOSIS — I739 Peripheral vascular disease, unspecified: Secondary | ICD-10-CM | POA: Diagnosis not present

## 2023-06-06 DIAGNOSIS — I071 Rheumatic tricuspid insufficiency: Secondary | ICD-10-CM | POA: Diagnosis not present

## 2023-06-07 NOTE — Progress Notes (Unsigned)
 Cardiology Office Note:    Date:  06/08/2023   ID:  [name], DOB [birth year], MRN [id number]  PCP:  [name], MD   [facility] Providers Cardiologist:  Thurmon Fair, MD     Referring MD: [name], MD   Chief Complaint  Patient presents with   Hospitalization Follow-up    Afib  Afib  History of Present Illness:    [name] is a 89 y.o. female with a hx of longstanding persistent atrial fibrillation, chronic systolic heart failure since 2019 with an EF as low as 15% in the setting of A-fib RVR, incomplete recovery with EF 35% in 2021, 25-30% in 2022, pulmonary artery hypertension with PAP 83 mm by echo 2021, severe tricuspid regurgitation and abnormal ventricular septal flattening improved to systolic PAP 38 mmHg by echo with moderate TR in 2022.  She has severe biatrial dilation and chronic LBBB.  She had a spontaneous retroperitoneal bleed on warfarin, remotely.  Per Dr. Royann Shivers, she has rather fragile compensation of her CHF.  She can have decompensation when skipping her daily torsemide or gaining only 4 to 5 pounds.  Dry weight felt to be 145-150 pounds.  Increasing metoprolol to 125 mg daily has improved her symptoms.  - No RAAS inhibition due to side effects including hypotension, cough with ACEi - Frequent UTIs preclude SGLT2i - Has done well on apixaban - Rate controlled with digoxin and metoprolol, amiodarone stopped due to side effects  Nuclear stress test in 2019 in the setting of A-fib RVR felt more consistent with LBBB related artifact.  She was seen by Dr. Royann Shivers 11/22/2022. Digoxin level was 0.8 and was reduced to 0.25 mg MWF. She has been grieving her husband's death and has not continued PT exercises at home, likely deconditioned.   Unfortunately she was hospitalized 04/2023 with weakness and DOE x few weeks. She was in Afib RVR with volume overload and possible PNA on CXR. She was treated with diuresis, cardizem gtt, and  brief course of ABX. She was discharged on home medications.  She was treated for UTI.  She presents today for follow up. She has not quite bounced back from hospitalization. She is near euvolemic today. She is walking short distances in the house for now. She is fatigued since hospitalization. No cardiac complaints today. She resumed 0.125 mg digoxin three times weekly.   Past Medical History:  Diagnosis Date   Anemia    Anxiety    Arthritis    "knees" (06/18/2015)   Atrial fibrillation with RVR (HCC) 06/18/2015   h/o sig bleed on coumadin   Cardiogenic shock 05/02/2017   Complication of anesthesia 2010   "w/cardiac cath; got into a psychotic state for ~ 3 days; got me out w/Valium"   Depression    "I have it off and on; not as often as I've gotten older" (06/18/2015)   Diverticulosis    descending and sigmoid colon--Dr. Jarold Motto   DJD (degenerative joint disease) of knee    left   Dyspnea    with exertion and in morning mostly when wakes up    E coli infection    History of   GERD (gastroesophageal reflux disease)    History of blood transfusion    History of Clostridium difficile colitis    IBS (irritable bowel syndrome)    Insomnia     off chronic ambien 5mg  as of 10/11, rare/episodic use since then  DCM/CHF   LBBB (left bundle branch  block)    Lymphocytic colitis 09/27/2016   Moderate to severe pulmonary hypertension (HCC) 11/26/2019   Nonischemic cardiomyopathy (HCC)    normal coronaries, EF 15-20% 04/2008, EF 50-55% 2015   Osteoporosis    on DXA 05/2010   Positive PPD    Sciatica    Right   Urinary incontinence    Occassionally    Past Surgical History:  Procedure Laterality Date   ANTERIOR APPROACH HEMI HIP ARTHROPLASTY Left 08/05/2019   Procedure: ANTERIOR APPROACH HEMI HIP ARTHROPLASTY;  Surgeon: Jodi Geralds, MD;  Location: [facility] OR;  Service: Orthopedics;  Laterality: Left;   BIOPSY  11/22/2017   Procedure: BIOPSY;  Surgeon: Iva Boop, MD;  Location: [facility]  ENDOSCOPY;  Service: Endoscopy;;   CARDIAC CATHETERIZATION  2010   CARDIOVERSION N/A 06/19/2015   Procedure: CARDIOVERSION;  Surgeon: Laurey Morale, MD;  Location: [facility] ENDOSCOPY;  Service: Cardiovascular;  Laterality: N/A;   CATARACT EXTRACTION W/ INTRAOCULAR LENS  IMPLANT, BILATERAL Bilateral    COLONOSCOPY WITH PROPOFOL N/A 11/22/2017   Procedure: COLONOSCOPY WITH PROPOFOL;  Surgeon: Iva Boop, MD;  Location: [facility] ENDOSCOPY;  Service: Endoscopy;  Laterality: N/A;   DILATION AND CURETTAGE OF UTERUS     ORIF ANKLE FRACTURE Right 09/22/2020   Procedure: RIGHT ANKLE OPEN REDUCTION INTERNAL FIXATION (ORIF);  Surgeon: [name], MD;  Location: [facility] ORS;  Service: Orthopedics;  Laterality: Right;   ORIF TIBIA PLATEAU Right 06/28/2022   Procedure: OPEN REDUCTION INTERNAL FIXATION (ORIF) TIBIA;  Surgeon: Roby Lofts, MD;  Location: [facility] OR;  Service: Orthopedics;  Laterality: Right;   TEE WITHOUT CARDIOVERSION N/A 06/19/2015   Procedure: TRANSESOPHAGEAL ECHOCARDIOGRAM (TEE);  Surgeon: Laurey Morale, MD;  Location: [facility] ENDOSCOPY;  Service: Cardiovascular;  Laterality: N/A;   TOTAL KNEE ARTHROPLASTY Left 04/25/2017   Procedure: TOTAL KNEE ARTHROPLASTY;  Surgeon: [name], MD;  Location: [facility] OR;  Service: Orthopedics;  Laterality: Left;   TOTAL KNEE ARTHROPLASTY Right 02/18/2020   Procedure: RIGHT TOTAL KNEE ARTHROPLASTY;  Surgeon: [name], MD;  Location: [facility] ORS;  Service: Orthopedics;  Laterality: Right;   TOTAL KNEE ARTHROPLASTY Right 06/16/2020   Procedure: RIGHT RETINACULAR REPAIR;  Surgeon: [name], MD;  Location: [facility] ORS;  Service: Orthopedics;  Laterality: Right;   TOTAL KNEE REVISION Left 12/03/2019   Procedure: LEFT TOTAL KNEE REVISION  OF POYLY EXCHANGE;  Surgeon: [name], MD;  Location: [facility] ORS;  Service: Orthopedics;  Laterality: Left;   VAGINAL HYSTERECTOMY  1979   ovaries intact    Current Medications: Current Meds  Medication Sig   acetaminophen  (TYLENOL) 500 MG tablet Take 2 tablets (1,000 mg total) by mouth every 8 (eight) hours as needed.   calcium carbonate (TUMS - DOSED IN MG ELEMENTAL CALCIUM) 500 MG chewable tablet Chew 500 mg by mouth 2 (two) times daily as needed for indigestion or heartburn.   cholecalciferol (VITAMIN D3) 25 MCG (1000 UNIT) tablet Take 1,000 Units by mouth in the morning.   digoxin (LANOXIN) 0.125 MG tablet Take 1 tablet (0.125 mg total) by mouth every Monday, Wednesday, and Friday.   ELIQUIS 2.5 MG TABS tablet TAKE 1 TABLET BY MOUTH TWICE A DAY   fluticasone (FLONASE) 50 MCG/ACT nasal spray Place 2 sprays into both nostrils daily.   loperamide (IMODIUM) 2 MG capsule Take 2 mg by mouth 3 (three) times daily as needed for diarrhea or loose stools.    loratadine (CLARITIN) 10 MG tablet Take 10 mg by mouth daily as needed for allergies.  metoprolol (TOPROL XL) 200 MG 24 hr tablet Take 1 tablet (200 mg total) by mouth daily.   ondansetron (ZOFRAN) 4 MG tablet Take 1 tablet (4 mg total) by mouth every 8 (eight) hours as needed for nausea or vomiting.   pantoprazole (PROTONIX) 20 MG tablet TAKE 1 TABLET BY MOUTH EVERY DAY   potassium chloride SA (KLOR-CON M20) 20 MEQ tablet Take 2 tablets (40 mEq total) by mouth daily.   Povidone (IVIZIA DRY EYES OP) Apply 2 drops to eye Once PRN.   torsemide (DEMADEX) 20 MG tablet Take 1 tablet (20 mg total) by mouth daily.   venlafaxine XR (EFFEXOR-XR) 37.5 MG 24 hr capsule TAKE 2 CAPSULES (75 MG TOTAL) BY MOUTH EVERY MORNING.     Allergies:   Ace inhibitors, Warfarin sodium, Famotidine, Vancomycin, Codeine, Delsym [dextromethorphan polistirex er], Lactose intolerance (gi), Lasix [furosemide], Phenylephrine, Sulfamethoxazole-trimethoprim, and Tramadol   Social History   Socioeconomic History   Marital status: Widowed    Spouse name: Dorene Sorrow    Number of children: 2   Years of education: 13   Highest education level: Not on file  Occupational History   Occupation: Retired     Associate Professor: RETIRED  Tobacco Use   Smoking status: Never   Smokeless tobacco: Never  Vaping Use   Vaping status: Never Used  Substance and Sexual Activity   Alcohol use: No    Alcohol/week: 0.0 standard drinks of alcohol   Drug use: No   Sexual activity: Not Currently    Birth control/protection: Post-menopausal, Surgical    Comment: Hysterectomy  Other Topics Concern   Not on file  Social History Narrative   Retired, former [employer]   Widowed 2024   Was married, 1953   2 grown children, one in [place], one out of state   Tobacco Use - No.    Drug Use - no   teaches Sunday school, reading   Social Drivers of Corporate investment banker Strain: Low Risk  (07/12/2021)   Overall Financial Resource Strain (CARDIA)    Difficulty of Paying Living Expenses: Not hard at all  Food Insecurity: No Food Insecurity (05/03/2023)   Hunger Vital Sign    Worried About Running Out of Food in the Last Year: Never true    Ran Out of Food in the Last Year: Never true  Transportation Needs: No Transportation Needs (05/03/2023)   PRAPARE - Administrator, Civil Service (Medical): No    Lack of Transportation (Non-Medical): No  Physical Activity: Sufficiently Active (08/11/2022)   Exercise Vital Sign    Days of Exercise per Week: 7 days    Minutes of Exercise per Session: 30 min  Stress: No Stress Concern Present (08/11/2022)   [employer] of Occupational Health - Occupational Stress Questionnaire    Feeling of Stress : Not at all  Social Connections: Moderately Isolated (04/30/2023)   Social Connection and Isolation Panel [NHANES]    Frequency of Communication with Friends and Family: More than three times a week    Frequency of Social Gatherings with Friends and Family: More than three times a week    Attends Religious Services: More than 4 times per year    Active Member of [employer] or Organizations: No    Attends Banker Meetings: Never    Marital Status:  Widowed     Family History: The patient's family history includes Colon cancer in her father; Other in her father; Tuberculosis in her mother. There  is no history of Breast cancer, Stomach cancer, or Pancreatic cancer.  ROS:   Please see the history of present illness.     All other systems reviewed and are negative.  EKGs/Labs/Other Studies Reviewed:    The following studies were reviewed today:  Echo 06/2022:  1. Left ventricular ejection fraction, by estimation, is 25 to 30%. The  left ventricle has severely decreased function. The left ventricle  demonstrates global hypokinesis with septal-lateral dyssynchrony  consistent with LBBB. Left ventricular diastolic  parameters are indeterminate.   2. Right ventricular systolic function is mildly reduced. The right  ventricular size is normal. There is mildly elevated pulmonary artery  systolic pressure. The estimated right ventricular systolic pressure is  37.3 mmHg.   3. Left atrial size was moderately dilated.   4. Right atrial size was mildly dilated.   5. The mitral valve is normal in structure. No evidence of mitral valve  regurgitation. No evidence of mitral stenosis.   6. The aortic valve is tricuspid. There is mild calcification of the  aortic valve. Aortic valve regurgitation is mild. No aortic stenosis is  present.   7. The inferior vena cava is normal in size with greater than 50%  respiratory variability, suggesting right atrial pressure of 3 mmHg.   8. The patient was in atrial fibrillation.    EKG Interpretation Date/Time:  Thursday June 08 2023 16:06:47 EST Ventricular Rate:  101 PR Interval:    QRS Duration:  150 QT Interval:  412 QTC Calculation: 534 R Axis:   -75  Text Interpretation: Atrial fibrillation with rapid ventricular response Left axis deviation Left bundle branch block When compared with ECG of 28-Apr-2023 13:38, No significant change was found Confirmed by Micah Flesher ([id number]) on 06/08/2023  4:18:36 PM    Recent Labs: 06/17/2022: TSH 2.54 11/15/2022: Pro B Natriuretic peptide (BNP) 877.0 04/28/2023: B Natriuretic Peptide 2,815.2; Magnesium 1.8 05/12/2023: ALT 10; BUN 48; Creatinine, Ser 0.91; Hemoglobin 14.0; Platelets 220.0; Potassium 4.3; Sodium 138  Recent Lipid Panel    Component Value Date/Time   CHOL 144 05/04/2021 1330   CHOL 156 02/05/2018 0937   TRIG 143.0 05/04/2021 1330   HDL 60.50 05/04/2021 1330   HDL 58 02/05/2018 0937   CHOLHDL 2 05/04/2021 1330   VLDL 28.6 05/04/2021 1330   LDLCALC 54 05/04/2021 1330   LDLCALC 73 02/05/2018 0937   LDLDIRECT 58.0 12/27/2018 1657     Risk Assessment/Calculations:    CHA2DS2-VASc Score = 5   This indicates a 7.2% annual risk of stroke. The patient's score is based upon: CHF History: 1 HTN History: 1 Diabetes History: 0 Stroke History: 0 Vascular Disease History: 0 Age Score: 2 Gender Score: 1             Physical Exam:    VS:  BP 105/68 (BP Location: Left Arm, Patient Position: Sitting, Cuff Size: Normal)   Pulse (!) 101   Ht 5\' 5"  (1.651 m)   Wt 144 lb (65.3 kg)   SpO2 95%   BMI 23.96 kg/m     Wt Readings from Last 3 Encounters:  06/08/23 144 lb (65.3 kg)  05/02/23 149 lb 8 oz (67.8 kg)  04/27/23 148 lb (67.1 kg)     GEN:  elderly female, NAD HEENT: Normal NECK: No JVD; No carotid bruits LYMPHATICS: No lymphadenopathy CARDIAC: irregular rhythm, regular rate RESPIRATORY:  Clear to auscultation without rales, wheezing or rhonchi  ABDOMEN: Soft, non-tender, non-distended MUSCULOSKELETAL:  No edema, but feet red, no  pain  SKIN: Warm and dry NEUROLOGIC:  Alert and oriented x 3 PSYCHIATRIC:  Normal affect   ASSESSMENT:    1. Permanent atrial fibrillation (HCC)   2. Chronic systolic heart failure (HCC)   3. Bilateral lower extremity edema   4. PAH (pulmonary artery hypertension) (HCC)   5. LBBB (left bundle branch block)   6. Chronic anticoagulation    PLAN:    In order of problems listed  above:  Chronic systolic heart failure Pulmonary hypertension/TR - intolerant to conventional GDMT given side effects - low normal LVEF prior to 2019, may have been due to LBBB related dyssynchrony  - SE to ACEI (cough), losartan (hypotension, fatigue), SGLT2i (UTI, weakness)  - will avoid entresto and spironolactone - dry weight is 149-150 lbs - narrow window for decompensation - takes daily lasix   Persistent atrial fibrillation LBBB - rate control with lopressor and digoxin - digoxin was reduced to 0.125 mg only 3 days per week MWF - she restarted digoxin after hospitalization   Chronic anticoagulation  - eliquis 2.5 mg BID - no bleeding issues   Does not wish to see Dr. Kirke Corin again. Follow up in 3 months with Dr. Royann Shivers.           Medication Adjustments/Labs and Tests Ordered: Current medicines are reviewed at length with the patient today.  Concerns regarding medicines are outlined above.  Orders Placed This Encounter  Procedures   EKG 12-Lead   No orders of the defined types were placed in this encounter.   Patient Instructions  Medication Instructions:  NO CHANGES   Lab Work: NONE  Testing/Procedures: NONE   Follow-Up: At [facility], you and your health needs are our priority.  As part of our continuing mission to provide you with exceptional heart care, we have created designated Provider Care Teams.  These Care Teams include your primary Cardiologist (physician) and Advanced Practice Providers (APPs -  Physician Assistants and Nurse Practitioners) who all work together to provide you with the care you need, when you need it.   Your next appointment:   3 month(s)  Provider:   Thurmon Fair, MD     Other Instructions         Signed, Roe Rutherford Wesson Stith, PA  06/08/2023 5:22 PM    [facility]

## 2023-06-08 ENCOUNTER — Ambulatory Visit: Payer: Medicare Other | Attending: Physician Assistant | Admitting: Physician Assistant

## 2023-06-08 ENCOUNTER — Encounter: Payer: Self-pay | Admitting: Physician Assistant

## 2023-06-08 VITALS — BP 105/68 | HR 101 | Ht 65.0 in | Wt 144.0 lb

## 2023-06-08 DIAGNOSIS — R600 Localized edema: Secondary | ICD-10-CM

## 2023-06-08 DIAGNOSIS — I5022 Chronic systolic (congestive) heart failure: Secondary | ICD-10-CM

## 2023-06-08 DIAGNOSIS — I2721 Secondary pulmonary arterial hypertension: Secondary | ICD-10-CM

## 2023-06-08 DIAGNOSIS — I4821 Permanent atrial fibrillation: Secondary | ICD-10-CM

## 2023-06-08 DIAGNOSIS — I447 Left bundle-branch block, unspecified: Secondary | ICD-10-CM | POA: Diagnosis not present

## 2023-06-08 DIAGNOSIS — Z7901 Long term (current) use of anticoagulants: Secondary | ICD-10-CM | POA: Diagnosis not present

## 2023-06-08 NOTE — Patient Instructions (Addendum)
 Medication Instructions:  NO CHANGES   Lab Work: NONE  Testing/Procedures: NONE   Follow-Up: At [facility], you and your health needs are our priority.  As part of our continuing mission to provide you with exceptional heart care, we have created designated Provider Care Teams.  These Care Teams include your primary Cardiologist (physician) and Advanced Practice Providers (APPs -  Physician Assistants and Nurse Practitioners) who all work together to provide you with the care you need, when you need it.   Your next appointment:   3 month(s)  Provider:   Thurmon Fair, MD     Other Instructions

## 2023-06-09 ENCOUNTER — Other Ambulatory Visit: Payer: Self-pay | Admitting: Cardiovascular Disease

## 2023-06-10 ENCOUNTER — Other Ambulatory Visit: Payer: Self-pay | Admitting: Family Medicine

## 2023-06-13 DIAGNOSIS — R1310 Dysphagia, unspecified: Secondary | ICD-10-CM | POA: Diagnosis not present

## 2023-06-13 DIAGNOSIS — J9600 Acute respiratory failure, unspecified whether with hypoxia or hypercapnia: Secondary | ICD-10-CM | POA: Diagnosis not present

## 2023-06-13 DIAGNOSIS — K589 Irritable bowel syndrome without diarrhea: Secondary | ICD-10-CM | POA: Diagnosis not present

## 2023-06-13 DIAGNOSIS — I4891 Unspecified atrial fibrillation: Secondary | ICD-10-CM | POA: Diagnosis not present

## 2023-06-13 DIAGNOSIS — G4700 Insomnia, unspecified: Secondary | ICD-10-CM | POA: Diagnosis not present

## 2023-06-13 DIAGNOSIS — M810 Age-related osteoporosis without current pathological fracture: Secondary | ICD-10-CM | POA: Diagnosis not present

## 2023-06-13 DIAGNOSIS — I272 Pulmonary hypertension, unspecified: Secondary | ICD-10-CM | POA: Diagnosis not present

## 2023-06-13 DIAGNOSIS — K219 Gastro-esophageal reflux disease without esophagitis: Secondary | ICD-10-CM | POA: Diagnosis not present

## 2023-06-13 DIAGNOSIS — R251 Tremor, unspecified: Secondary | ICD-10-CM | POA: Diagnosis not present

## 2023-06-13 DIAGNOSIS — Z7951 Long term (current) use of inhaled steroids: Secondary | ICD-10-CM | POA: Diagnosis not present

## 2023-06-13 DIAGNOSIS — G8929 Other chronic pain: Secondary | ICD-10-CM | POA: Diagnosis not present

## 2023-06-13 DIAGNOSIS — G629 Polyneuropathy, unspecified: Secondary | ICD-10-CM | POA: Diagnosis not present

## 2023-06-13 DIAGNOSIS — I447 Left bundle-branch block, unspecified: Secondary | ICD-10-CM | POA: Diagnosis not present

## 2023-06-13 DIAGNOSIS — I5043 Acute on chronic combined systolic (congestive) and diastolic (congestive) heart failure: Secondary | ICD-10-CM | POA: Diagnosis not present

## 2023-06-13 DIAGNOSIS — I428 Other cardiomyopathies: Secondary | ICD-10-CM | POA: Diagnosis not present

## 2023-06-13 DIAGNOSIS — I739 Peripheral vascular disease, unspecified: Secondary | ICD-10-CM | POA: Diagnosis not present

## 2023-06-13 DIAGNOSIS — D649 Anemia, unspecified: Secondary | ICD-10-CM | POA: Diagnosis not present

## 2023-06-13 DIAGNOSIS — K5730 Diverticulosis of large intestine without perforation or abscess without bleeding: Secondary | ICD-10-CM | POA: Diagnosis not present

## 2023-06-13 DIAGNOSIS — Z7901 Long term (current) use of anticoagulants: Secondary | ICD-10-CM | POA: Diagnosis not present

## 2023-06-13 DIAGNOSIS — I071 Rheumatic tricuspid insufficiency: Secondary | ICD-10-CM | POA: Diagnosis not present

## 2023-06-13 DIAGNOSIS — M1990 Unspecified osteoarthritis, unspecified site: Secondary | ICD-10-CM | POA: Diagnosis not present

## 2023-06-21 DIAGNOSIS — G629 Polyneuropathy, unspecified: Secondary | ICD-10-CM | POA: Diagnosis not present

## 2023-06-21 DIAGNOSIS — I739 Peripheral vascular disease, unspecified: Secondary | ICD-10-CM | POA: Diagnosis not present

## 2023-06-21 DIAGNOSIS — M810 Age-related osteoporosis without current pathological fracture: Secondary | ICD-10-CM | POA: Diagnosis not present

## 2023-06-21 DIAGNOSIS — R251 Tremor, unspecified: Secondary | ICD-10-CM | POA: Diagnosis not present

## 2023-06-21 DIAGNOSIS — Z7951 Long term (current) use of inhaled steroids: Secondary | ICD-10-CM | POA: Diagnosis not present

## 2023-06-21 DIAGNOSIS — I5043 Acute on chronic combined systolic (congestive) and diastolic (congestive) heart failure: Secondary | ICD-10-CM | POA: Diagnosis not present

## 2023-06-21 DIAGNOSIS — D649 Anemia, unspecified: Secondary | ICD-10-CM | POA: Diagnosis not present

## 2023-06-21 DIAGNOSIS — K219 Gastro-esophageal reflux disease without esophagitis: Secondary | ICD-10-CM | POA: Diagnosis not present

## 2023-06-21 DIAGNOSIS — G4700 Insomnia, unspecified: Secondary | ICD-10-CM | POA: Diagnosis not present

## 2023-06-21 DIAGNOSIS — I447 Left bundle-branch block, unspecified: Secondary | ICD-10-CM | POA: Diagnosis not present

## 2023-06-21 DIAGNOSIS — M1990 Unspecified osteoarthritis, unspecified site: Secondary | ICD-10-CM | POA: Diagnosis not present

## 2023-06-21 DIAGNOSIS — Z7901 Long term (current) use of anticoagulants: Secondary | ICD-10-CM | POA: Diagnosis not present

## 2023-06-21 DIAGNOSIS — G8929 Other chronic pain: Secondary | ICD-10-CM | POA: Diagnosis not present

## 2023-06-21 DIAGNOSIS — K5730 Diverticulosis of large intestine without perforation or abscess without bleeding: Secondary | ICD-10-CM | POA: Diagnosis not present

## 2023-06-21 DIAGNOSIS — R1310 Dysphagia, unspecified: Secondary | ICD-10-CM | POA: Diagnosis not present

## 2023-06-21 DIAGNOSIS — I4891 Unspecified atrial fibrillation: Secondary | ICD-10-CM | POA: Diagnosis not present

## 2023-06-21 DIAGNOSIS — K589 Irritable bowel syndrome without diarrhea: Secondary | ICD-10-CM | POA: Diagnosis not present

## 2023-06-21 DIAGNOSIS — I428 Other cardiomyopathies: Secondary | ICD-10-CM | POA: Diagnosis not present

## 2023-06-21 DIAGNOSIS — J9600 Acute respiratory failure, unspecified whether with hypoxia or hypercapnia: Secondary | ICD-10-CM | POA: Diagnosis not present

## 2023-06-21 DIAGNOSIS — I272 Pulmonary hypertension, unspecified: Secondary | ICD-10-CM | POA: Diagnosis not present

## 2023-06-21 DIAGNOSIS — I071 Rheumatic tricuspid insufficiency: Secondary | ICD-10-CM | POA: Diagnosis not present

## 2023-06-23 ENCOUNTER — Encounter: Payer: Self-pay | Admitting: Family Medicine

## 2023-07-03 ENCOUNTER — Encounter: Payer: Self-pay | Admitting: Family Medicine

## 2023-07-22 ENCOUNTER — Other Ambulatory Visit: Payer: Self-pay | Admitting: Cardiovascular Disease

## 2023-07-24 ENCOUNTER — Encounter: Payer: Self-pay | Admitting: Family Medicine

## 2023-07-24 ENCOUNTER — Other Ambulatory Visit: Payer: Self-pay | Admitting: Family Medicine

## 2023-07-24 MED ORDER — TORSEMIDE 20 MG PO TABS: 20.0000 mg | ORAL_TABLET | Freq: Every day | ORAL | 1 refills | Status: DC

## 2023-08-05 ENCOUNTER — Inpatient Hospital Stay (HOSPITAL_COMMUNITY)
Admission: EM | Admit: 2023-08-05 | Discharge: 2023-08-11 | DRG: 511 | Disposition: A | Discharge status: Skilled Nursing Facility | Attending: Internal Medicine | Admitting: Internal Medicine

## 2023-08-05 ENCOUNTER — Encounter (HOSPITAL_COMMUNITY): Payer: Self-pay

## 2023-08-05 ENCOUNTER — Emergency Department (HOSPITAL_COMMUNITY)

## 2023-08-05 ENCOUNTER — Other Ambulatory Visit: Payer: Self-pay

## 2023-08-05 DIAGNOSIS — S62101A Fracture of unspecified carpal bone, right wrist, initial encounter for closed fracture: Secondary | ICD-10-CM | POA: Diagnosis not present

## 2023-08-05 DIAGNOSIS — I272 Pulmonary hypertension, unspecified: Secondary | ICD-10-CM | POA: Diagnosis present

## 2023-08-05 DIAGNOSIS — Z7901 Long term (current) use of anticoagulants: Secondary | ICD-10-CM | POA: Diagnosis not present

## 2023-08-05 DIAGNOSIS — K589 Irritable bowel syndrome without diarrhea: Secondary | ICD-10-CM | POA: Diagnosis present

## 2023-08-05 DIAGNOSIS — I5042 Chronic combined systolic (congestive) and diastolic (congestive) heart failure: Secondary | ICD-10-CM | POA: Diagnosis present

## 2023-08-05 DIAGNOSIS — Z961 Presence of intraocular lens: Secondary | ICD-10-CM | POA: Diagnosis present

## 2023-08-05 DIAGNOSIS — Z96642 Presence of left artificial hip joint: Secondary | ICD-10-CM | POA: Diagnosis present

## 2023-08-05 DIAGNOSIS — Z90710 Acquired absence of both cervix and uterus: Secondary | ICD-10-CM

## 2023-08-05 DIAGNOSIS — F32A Depression, unspecified: Secondary | ICD-10-CM | POA: Diagnosis not present

## 2023-08-05 DIAGNOSIS — W19XXXA Unspecified fall, initial encounter: Secondary | ICD-10-CM | POA: Diagnosis not present

## 2023-08-05 DIAGNOSIS — E739 Lactose intolerance, unspecified: Secondary | ICD-10-CM | POA: Diagnosis present

## 2023-08-05 DIAGNOSIS — S52571A Other intraarticular fracture of lower end of right radius, initial encounter for closed fracture: Principal | ICD-10-CM | POA: Diagnosis present

## 2023-08-05 DIAGNOSIS — F419 Anxiety disorder, unspecified: Secondary | ICD-10-CM | POA: Diagnosis not present

## 2023-08-05 DIAGNOSIS — I447 Left bundle-branch block, unspecified: Secondary | ICD-10-CM | POA: Diagnosis present

## 2023-08-05 DIAGNOSIS — K219 Gastro-esophageal reflux disease without esophagitis: Secondary | ICD-10-CM | POA: Diagnosis not present

## 2023-08-05 DIAGNOSIS — D72829 Elevated white blood cell count, unspecified: Secondary | ICD-10-CM | POA: Diagnosis not present

## 2023-08-05 DIAGNOSIS — I6782 Cerebral ischemia: Secondary | ICD-10-CM | POA: Diagnosis not present

## 2023-08-05 DIAGNOSIS — M810 Age-related osteoporosis without current pathological fracture: Secondary | ICD-10-CM | POA: Diagnosis present

## 2023-08-05 DIAGNOSIS — R5383 Other fatigue: Secondary | ICD-10-CM | POA: Diagnosis not present

## 2023-08-05 DIAGNOSIS — Y92009 Unspecified place in unspecified non-institutional (private) residence as the place of occurrence of the external cause: Secondary | ICD-10-CM

## 2023-08-05 DIAGNOSIS — Z8619 Personal history of other infectious and parasitic diseases: Secondary | ICD-10-CM

## 2023-08-05 DIAGNOSIS — S52571D Other intraarticular fracture of lower end of right radius, subsequent encounter for closed fracture with routine healing: Secondary | ICD-10-CM | POA: Diagnosis not present

## 2023-08-05 DIAGNOSIS — Z66 Do not resuscitate: Secondary | ICD-10-CM | POA: Diagnosis present

## 2023-08-05 DIAGNOSIS — I739 Peripheral vascular disease, unspecified: Secondary | ICD-10-CM | POA: Diagnosis present

## 2023-08-05 DIAGNOSIS — I509 Heart failure, unspecified: Secondary | ICD-10-CM | POA: Diagnosis not present

## 2023-08-05 DIAGNOSIS — D649 Anemia, unspecified: Secondary | ICD-10-CM | POA: Diagnosis present

## 2023-08-05 DIAGNOSIS — I4891 Unspecified atrial fibrillation: Secondary | ICD-10-CM | POA: Diagnosis present

## 2023-08-05 DIAGNOSIS — I428 Other cardiomyopathies: Secondary | ICD-10-CM | POA: Diagnosis present

## 2023-08-05 DIAGNOSIS — E8809 Other disorders of plasma-protein metabolism, not elsewhere classified: Secondary | ICD-10-CM | POA: Diagnosis not present

## 2023-08-05 DIAGNOSIS — G4700 Insomnia, unspecified: Secondary | ICD-10-CM | POA: Diagnosis present

## 2023-08-05 DIAGNOSIS — R32 Unspecified urinary incontinence: Secondary | ICD-10-CM | POA: Diagnosis present

## 2023-08-05 DIAGNOSIS — Z96653 Presence of artificial knee joint, bilateral: Secondary | ICD-10-CM | POA: Diagnosis present

## 2023-08-05 DIAGNOSIS — W1839XA Other fall on same level, initial encounter: Secondary | ICD-10-CM | POA: Diagnosis present

## 2023-08-05 DIAGNOSIS — I10 Essential (primary) hypertension: Secondary | ICD-10-CM | POA: Diagnosis not present

## 2023-08-05 DIAGNOSIS — Z800 Family history of malignant neoplasm of digestive organs: Secondary | ICD-10-CM

## 2023-08-05 DIAGNOSIS — Z831 Family history of other infectious and parasitic diseases: Secondary | ICD-10-CM

## 2023-08-05 DIAGNOSIS — S52501A Unspecified fracture of the lower end of right radius, initial encounter for closed fracture: Secondary | ICD-10-CM | POA: Diagnosis not present

## 2023-08-05 DIAGNOSIS — S52572A Other intraarticular fracture of lower end of left radius, initial encounter for closed fracture: Secondary | ICD-10-CM | POA: Diagnosis not present

## 2023-08-05 DIAGNOSIS — Z885 Allergy status to narcotic agent status: Secondary | ICD-10-CM

## 2023-08-05 DIAGNOSIS — Z79899 Other long term (current) drug therapy: Secondary | ICD-10-CM

## 2023-08-05 DIAGNOSIS — S6990XA Unspecified injury of unspecified wrist, hand and finger(s), initial encounter: Secondary | ICD-10-CM | POA: Diagnosis not present

## 2023-08-05 DIAGNOSIS — E861 Hypovolemia: Secondary | ICD-10-CM | POA: Diagnosis not present

## 2023-08-05 DIAGNOSIS — R17 Unspecified jaundice: Secondary | ICD-10-CM | POA: Diagnosis present

## 2023-08-05 DIAGNOSIS — Z888 Allergy status to other drugs, medicaments and biological substances status: Secondary | ICD-10-CM

## 2023-08-05 DIAGNOSIS — R443 Hallucinations, unspecified: Secondary | ICD-10-CM | POA: Diagnosis not present

## 2023-08-05 DIAGNOSIS — Z881 Allergy status to other antibiotic agents status: Secondary | ICD-10-CM

## 2023-08-05 DIAGNOSIS — Z043 Encounter for examination and observation following other accident: Secondary | ICD-10-CM | POA: Diagnosis not present

## 2023-08-05 DIAGNOSIS — T402X5A Adverse effect of other opioids, initial encounter: Secondary | ICD-10-CM | POA: Diagnosis not present

## 2023-08-05 DIAGNOSIS — I110 Hypertensive heart disease with heart failure: Secondary | ICD-10-CM | POA: Diagnosis not present

## 2023-08-05 DIAGNOSIS — Z882 Allergy status to sulfonamides status: Secondary | ICD-10-CM

## 2023-08-05 DIAGNOSIS — I4821 Permanent atrial fibrillation: Secondary | ICD-10-CM

## 2023-08-05 MED ORDER — HYDROCODONE-ACETAMINOPHEN 5-325 MG PO TABS
1.0000 | ORAL_TABLET | Freq: Once | ORAL | Status: AC
  Administered 2023-08-05: 1 via ORAL
  Filled 2023-08-05: qty 1

## 2023-08-05 NOTE — ED Triage Notes (Signed)
 Pt fell tonight with an obvious deformity rt wrist. Denies hitting head, is on eliquis .

## 2023-08-06 ENCOUNTER — Emergency Department (HOSPITAL_COMMUNITY)

## 2023-08-06 ENCOUNTER — Encounter (HOSPITAL_COMMUNITY): Payer: Self-pay | Admitting: Family Medicine

## 2023-08-06 DIAGNOSIS — E559 Vitamin D deficiency, unspecified: Secondary | ICD-10-CM | POA: Diagnosis not present

## 2023-08-06 DIAGNOSIS — I509 Heart failure, unspecified: Secondary | ICD-10-CM | POA: Diagnosis not present

## 2023-08-06 DIAGNOSIS — Z043 Encounter for examination and observation following other accident: Secondary | ICD-10-CM | POA: Diagnosis not present

## 2023-08-06 DIAGNOSIS — I5042 Chronic combined systolic (congestive) and diastolic (congestive) heart failure: Secondary | ICD-10-CM

## 2023-08-06 DIAGNOSIS — R2689 Other abnormalities of gait and mobility: Secondary | ICD-10-CM | POA: Diagnosis not present

## 2023-08-06 DIAGNOSIS — K219 Gastro-esophageal reflux disease without esophagitis: Secondary | ICD-10-CM | POA: Diagnosis not present

## 2023-08-06 DIAGNOSIS — M25551 Pain in right hip: Secondary | ICD-10-CM | POA: Diagnosis not present

## 2023-08-06 DIAGNOSIS — F32A Depression, unspecified: Secondary | ICD-10-CM

## 2023-08-06 DIAGNOSIS — S52501D Unspecified fracture of the lower end of right radius, subsequent encounter for closed fracture with routine healing: Secondary | ICD-10-CM | POA: Diagnosis not present

## 2023-08-06 DIAGNOSIS — W19XXXA Unspecified fall, initial encounter: Secondary | ICD-10-CM | POA: Diagnosis not present

## 2023-08-06 DIAGNOSIS — I110 Hypertensive heart disease with heart failure: Secondary | ICD-10-CM | POA: Diagnosis not present

## 2023-08-06 DIAGNOSIS — I6782 Cerebral ischemia: Secondary | ICD-10-CM | POA: Diagnosis not present

## 2023-08-06 DIAGNOSIS — I5032 Chronic diastolic (congestive) heart failure: Secondary | ICD-10-CM | POA: Diagnosis not present

## 2023-08-06 DIAGNOSIS — S52571A Other intraarticular fracture of lower end of right radius, initial encounter for closed fracture: Secondary | ICD-10-CM | POA: Diagnosis not present

## 2023-08-06 DIAGNOSIS — G4700 Insomnia, unspecified: Secondary | ICD-10-CM | POA: Diagnosis present

## 2023-08-06 DIAGNOSIS — Z7401 Bed confinement status: Secondary | ICD-10-CM | POA: Diagnosis not present

## 2023-08-06 DIAGNOSIS — D72829 Elevated white blood cell count, unspecified: Secondary | ICD-10-CM | POA: Diagnosis not present

## 2023-08-06 DIAGNOSIS — S62101A Fracture of unspecified carpal bone, right wrist, initial encounter for closed fracture: Secondary | ICD-10-CM | POA: Diagnosis not present

## 2023-08-06 DIAGNOSIS — Y92009 Unspecified place in unspecified non-institutional (private) residence as the place of occurrence of the external cause: Secondary | ICD-10-CM | POA: Diagnosis not present

## 2023-08-06 DIAGNOSIS — S52501A Unspecified fracture of the lower end of right radius, initial encounter for closed fracture: Secondary | ICD-10-CM | POA: Diagnosis present

## 2023-08-06 DIAGNOSIS — I272 Pulmonary hypertension, unspecified: Secondary | ICD-10-CM | POA: Diagnosis not present

## 2023-08-06 DIAGNOSIS — Z96653 Presence of artificial knee joint, bilateral: Secondary | ICD-10-CM | POA: Diagnosis present

## 2023-08-06 DIAGNOSIS — Z4889 Encounter for other specified surgical aftercare: Secondary | ICD-10-CM | POA: Diagnosis not present

## 2023-08-06 DIAGNOSIS — R600 Localized edema: Secondary | ICD-10-CM | POA: Diagnosis not present

## 2023-08-06 DIAGNOSIS — D649 Anemia, unspecified: Secondary | ICD-10-CM | POA: Diagnosis not present

## 2023-08-06 DIAGNOSIS — Z743 Need for continuous supervision: Secondary | ICD-10-CM | POA: Diagnosis not present

## 2023-08-06 DIAGNOSIS — Z96642 Presence of left artificial hip joint: Secondary | ICD-10-CM | POA: Diagnosis present

## 2023-08-06 DIAGNOSIS — M6281 Muscle weakness (generalized): Secondary | ICD-10-CM | POA: Diagnosis not present

## 2023-08-06 DIAGNOSIS — I10 Essential (primary) hypertension: Secondary | ICD-10-CM | POA: Diagnosis not present

## 2023-08-06 DIAGNOSIS — E861 Hypovolemia: Secondary | ICD-10-CM | POA: Diagnosis not present

## 2023-08-06 DIAGNOSIS — I4891 Unspecified atrial fibrillation: Secondary | ICD-10-CM | POA: Diagnosis not present

## 2023-08-06 DIAGNOSIS — R17 Unspecified jaundice: Secondary | ICD-10-CM | POA: Diagnosis not present

## 2023-08-06 DIAGNOSIS — R112 Nausea with vomiting, unspecified: Secondary | ICD-10-CM | POA: Diagnosis not present

## 2023-08-06 DIAGNOSIS — Z79899 Other long term (current) drug therapy: Secondary | ICD-10-CM | POA: Diagnosis not present

## 2023-08-06 DIAGNOSIS — I428 Other cardiomyopathies: Secondary | ICD-10-CM | POA: Diagnosis not present

## 2023-08-06 DIAGNOSIS — Z66 Do not resuscitate: Secondary | ICD-10-CM | POA: Diagnosis not present

## 2023-08-06 DIAGNOSIS — Z7901 Long term (current) use of anticoagulants: Secondary | ICD-10-CM | POA: Diagnosis not present

## 2023-08-06 DIAGNOSIS — E8809 Other disorders of plasma-protein metabolism, not elsewhere classified: Secondary | ICD-10-CM | POA: Diagnosis not present

## 2023-08-06 DIAGNOSIS — F419 Anxiety disorder, unspecified: Secondary | ICD-10-CM

## 2023-08-06 DIAGNOSIS — I447 Left bundle-branch block, unspecified: Secondary | ICD-10-CM | POA: Diagnosis not present

## 2023-08-06 DIAGNOSIS — R443 Hallucinations, unspecified: Secondary | ICD-10-CM | POA: Diagnosis not present

## 2023-08-06 DIAGNOSIS — E46 Unspecified protein-calorie malnutrition: Secondary | ICD-10-CM | POA: Diagnosis not present

## 2023-08-06 DIAGNOSIS — W1839XA Other fall on same level, initial encounter: Secondary | ICD-10-CM | POA: Diagnosis present

## 2023-08-06 DIAGNOSIS — K5900 Constipation, unspecified: Secondary | ICD-10-CM | POA: Diagnosis not present

## 2023-08-06 DIAGNOSIS — J309 Allergic rhinitis, unspecified: Secondary | ICD-10-CM | POA: Diagnosis not present

## 2023-08-06 DIAGNOSIS — M810 Age-related osteoporosis without current pathological fracture: Secondary | ICD-10-CM | POA: Diagnosis not present

## 2023-08-06 DIAGNOSIS — I872 Venous insufficiency (chronic) (peripheral): Secondary | ICD-10-CM | POA: Diagnosis not present

## 2023-08-06 DIAGNOSIS — S52571D Other intraarticular fracture of lower end of right radius, subsequent encounter for closed fracture with routine healing: Secondary | ICD-10-CM | POA: Diagnosis not present

## 2023-08-06 DIAGNOSIS — S6291XA Unspecified fracture of right wrist and hand, initial encounter for closed fracture: Secondary | ICD-10-CM | POA: Diagnosis not present

## 2023-08-06 DIAGNOSIS — I2510 Atherosclerotic heart disease of native coronary artery without angina pectoris: Secondary | ICD-10-CM | POA: Diagnosis not present

## 2023-08-06 DIAGNOSIS — I739 Peripheral vascular disease, unspecified: Secondary | ICD-10-CM | POA: Diagnosis not present

## 2023-08-06 LAB — CBC WITH DIFFERENTIAL/PLATELET
Abs Immature Granulocytes: 0.07 10*3/uL (ref 0.00–0.07)
Basophils Absolute: 0 10*3/uL (ref 0.0–0.1)
Basophils Relative: 0 %
Eosinophils Absolute: 0 10*3/uL (ref 0.0–0.5)
Eosinophils Relative: 0 %
HCT: 38.4 % (ref 36.0–46.0)
Hemoglobin: 12.4 g/dL (ref 12.0–15.0)
Immature Granulocytes: 1 %
Lymphocytes Relative: 12 %
Lymphs Abs: 1.3 10*3/uL (ref 0.7–4.0)
MCH: 31.6 pg (ref 26.0–34.0)
MCHC: 32.3 g/dL (ref 30.0–36.0)
MCV: 98 fL (ref 80.0–100.0)
Monocytes Absolute: 0.7 10*3/uL (ref 0.1–1.0)
Monocytes Relative: 7 %
Neutro Abs: 9 10*3/uL — ABNORMAL HIGH (ref 1.7–7.7)
Neutrophils Relative %: 80 %
Platelets: 207 10*3/uL (ref 150–400)
RBC: 3.92 MIL/uL (ref 3.87–5.11)
RDW: 13 % (ref 11.5–15.5)
WBC: 11.2 10*3/uL — ABNORMAL HIGH (ref 4.0–10.5)
nRBC: 0 % (ref 0.0–0.2)

## 2023-08-06 LAB — BASIC METABOLIC PANEL WITH GFR
Anion gap: 10 (ref 5–15)
BUN: 39 mg/dL — ABNORMAL HIGH (ref 8–23)
CO2: 27 mmol/L (ref 22–32)
Calcium: 9.7 mg/dL (ref 8.9–10.3)
Chloride: 101 mmol/L (ref 98–111)
Creatinine, Ser: 0.94 mg/dL (ref 0.44–1.00)
GFR, Estimated: 58 mL/min — ABNORMAL LOW (ref >60)
Glucose, Bld: 126 mg/dL — ABNORMAL HIGH (ref 70–99)
Potassium: 3.8 mmol/L (ref 3.5–5.1)
Sodium: 138 mmol/L (ref 135–145)

## 2023-08-06 LAB — I-STAT CHEM 8, ED
BUN: 39 mg/dL — ABNORMAL HIGH (ref 8–23)
Calcium, Ion: 1.17 mmol/L (ref 1.15–1.40)
Chloride: 100 mmol/L (ref 98–111)
Creatinine, Ser: 1 mg/dL (ref 0.44–1.00)
Glucose, Bld: 132 mg/dL — ABNORMAL HIGH (ref 70–99)
HCT: 38 % (ref 36.0–46.0)
Hemoglobin: 12.9 g/dL (ref 12.0–15.0)
Potassium: 3.8 mmol/L (ref 3.5–5.1)
Sodium: 140 mmol/L (ref 135–145)
TCO2: 30 mmol/L (ref 22–32)

## 2023-08-06 LAB — PROTIME-INR
INR: 1.2 (ref 0.8–1.2)
Prothrombin Time: 15.4 s — ABNORMAL HIGH (ref 11.4–15.2)

## 2023-08-06 LAB — MAGNESIUM: Magnesium: 2.2 mg/dL (ref 1.7–2.4)

## 2023-08-06 MED ORDER — SODIUM CHLORIDE 0.9% FLUSH
3.0000 mL | Freq: Two times a day (BID) | INTRAVENOUS | Status: DC
  Administered 2023-08-06 – 2023-08-11 (×10): 3 mL via INTRAVENOUS

## 2023-08-06 MED ORDER — PROCHLORPERAZINE EDISYLATE 10 MG/2ML IJ SOLN: 5.0000 mg | Freq: Four times a day (QID) | INTRAMUSCULAR | Status: DC | PRN

## 2023-08-06 MED ORDER — FENTANYL CITRATE PF 50 MCG/ML IJ SOSY
50.0000 ug | PREFILLED_SYRINGE | Freq: Once | INTRAMUSCULAR | Status: AC
  Administered 2023-08-06: 50 ug via INTRAVENOUS
  Filled 2023-08-06: qty 1

## 2023-08-06 MED ORDER — SENNOSIDES-DOCUSATE SODIUM 8.6-50 MG PO TABS: 1.0000 | ORAL_TABLET | Freq: Every evening | ORAL | Status: DC | PRN

## 2023-08-06 MED ORDER — BISACODYL 5 MG PO TBEC
5.0000 mg | DELAYED_RELEASE_TABLET | Freq: Every day | ORAL | Status: DC | PRN
  Administered 2023-08-07 – 2023-08-08 (×2): 5 mg via ORAL
  Filled 2023-08-06 (×2): qty 1

## 2023-08-06 MED ORDER — ACETAMINOPHEN 325 MG PO TABS
650.0000 mg | ORAL_TABLET | Freq: Four times a day (QID) | ORAL | Status: DC | PRN
  Administered 2023-08-06 – 2023-08-11 (×9): 650 mg via ORAL
  Filled 2023-08-06 (×10): qty 2

## 2023-08-06 MED ORDER — DIGOXIN 125 MCG PO TABS
0.1250 mg | ORAL_TABLET | ORAL | Status: DC
  Administered 2023-08-07 – 2023-08-11 (×3): 0.125 mg via ORAL
  Filled 2023-08-06 (×3): qty 1

## 2023-08-06 MED ORDER — VENLAFAXINE HCL ER 75 MG PO CP24
75.0000 mg | ORAL_CAPSULE | Freq: Every morning | ORAL | Status: DC
Start: 2023-08-06 — End: 2023-08-11
  Administered 2023-08-06 – 2023-08-11 (×6): 75 mg via ORAL
  Filled 2023-08-06 (×6): qty 1

## 2023-08-06 MED ORDER — ACETAMINOPHEN 650 MG RE SUPP: 650.0000 mg | Freq: Four times a day (QID) | RECTAL | Status: DC | PRN

## 2023-08-06 MED ORDER — ONDANSETRON HCL 4 MG PO TABS
4.0000 mg | ORAL_TABLET | Freq: Three times a day (TID) | ORAL | Status: DC | PRN
  Administered 2023-08-06: 4 mg via ORAL

## 2023-08-06 MED ORDER — FENTANYL CITRATE PF 50 MCG/ML IJ SOSY: 12.5000 ug | PREFILLED_SYRINGE | INTRAMUSCULAR | Status: DC | PRN

## 2023-08-06 MED ORDER — METOPROLOL SUCCINATE ER 100 MG PO TB24
200.0000 mg | ORAL_TABLET | Freq: Every day | ORAL | Status: DC
  Administered 2023-08-06 – 2023-08-11 (×4): 200 mg via ORAL
  Filled 2023-08-06 (×6): qty 2

## 2023-08-06 MED ORDER — OXYCODONE HCL 5 MG PO TABS
2.5000 mg | ORAL_TABLET | ORAL | Status: DC | PRN
  Administered 2023-08-06 – 2023-08-07 (×5): 5 mg via ORAL
  Administered 2023-08-07: 2.5 mg via ORAL
  Administered 2023-08-08: 5 mg via ORAL
  Filled 2023-08-06 (×7): qty 1

## 2023-08-06 MED ORDER — KETOROLAC TROMETHAMINE 30 MG/ML IJ SOLN
15.0000 mg | Freq: Once | INTRAMUSCULAR | Status: AC
  Administered 2023-08-06: 15 mg via INTRAVENOUS
  Filled 2023-08-06: qty 1

## 2023-08-06 MED ORDER — PANTOPRAZOLE SODIUM 20 MG PO TBEC
20.0000 mg | DELAYED_RELEASE_TABLET | Freq: Every day | ORAL | Status: DC
  Administered 2023-08-06 – 2023-08-11 (×6): 20 mg via ORAL
  Filled 2023-08-06 (×6): qty 1

## 2023-08-06 MED ORDER — TORSEMIDE 20 MG PO TABS
20.0000 mg | ORAL_TABLET | Freq: Every day | ORAL | Status: DC
  Administered 2023-08-06: 20 mg via ORAL
  Filled 2023-08-06: qty 1

## 2023-08-06 NOTE — ED Notes (Signed)
 XR notified for post reduction scan.

## 2023-08-06 NOTE — ED Notes (Signed)
 [facility] notified of need for volar splint and sling. States they are unavailable at this time but will be able to complete orders. MD notified.

## 2023-08-06 NOTE — ED Notes (Signed)
 [facility] called. NA.

## 2023-08-06 NOTE — ED Provider Notes (Signed)
 [place] EMERGENCY DEPARTMENT AT [facility] Provider Note   CSN: [id number] Arrival date & time: 08/05/23  2223     History  Chief Complaint  Patient presents with   [name] is a 89 y.o. female.  The history is provided by the patient.  Fall This is a new problem. The current episode started 3 to 5 hours ago. The problem occurs rarely. The problem has been resolved. Pertinent negatives include no chest pain, no abdominal pain, no headaches and no shortness of breath. Nothing aggravates the symptoms. Nothing relieves the symptoms. Treatments tried: vicodin in triage. The treatment provided mild relief.  Patient with AFIB on Eliquis  presents with a fall on outstretched hand this evening.       Past Medical History:  Diagnosis Date   Anemia    Anxiety    Arthritis    "knees" (06/18/2015)   Atrial fibrillation with RVR (HCC) 06/18/2015   h/o sig bleed on coumadin   Cardiogenic shock 05/02/2017   Complication of anesthesia 2010   "w/cardiac cath; got into a psychotic state for ~ 3 days; got me out w/Valium"   Depression    "I have it off and on; not as often as I've gotten older" (06/18/2015)   Diverticulosis    descending and sigmoid colon--Dr. Adan Holms   DJD (degenerative joint disease) of knee    left   Dyspnea    with exertion and in morning mostly when wakes up    E coli infection    History of   GERD (gastroesophageal reflux disease)    History of blood transfusion    History of Clostridium difficile colitis    IBS (irritable bowel syndrome)    Insomnia     off chronic ambien  5mg  as of 10/11, rare/episodic use since then  DCM/CHF   LBBB (left bundle branch block)    Lymphocytic colitis 09/27/2016   Moderate to severe pulmonary hypertension (HCC) 11/26/2019   Nonischemic cardiomyopathy (HCC)    normal coronaries, EF 15-20% 04/2008, EF 50-55% 2015   Osteoporosis    on DXA 05/2010   Positive PPD    Sciatica    Right   Urinary  incontinence    Occassionally     Home Medications Prior to Admission medications   Medication Sig Start Date End Date Taking? Authorizing Provider  acetaminophen  (TYLENOL ) 500 MG tablet Take 2 tablets (1,000 mg total) by mouth every 8 (eight) hours as needed. 07/07/22   Donnie Galea, MD  calcium  carbonate (TUMS - DOSED IN MG ELEMENTAL CALCIUM ) 500 MG chewable tablet Chew 500 mg by mouth 2 (two) times daily as needed for indigestion or heartburn.    [provider]  cholecalciferol  (VITAMIN D3) 25 MCG (1000 UNIT) tablet Take 1,000 Units by mouth in the morning.    [provider]  digoxin  (LANOXIN ) 0.125 MG tablet Take 1 tablet (0.125 mg total) by mouth every Monday, Wednesday, and Friday. 11/23/22   Croitoru, Mihai, MD  ELIQUIS  2.5 MG TABS tablet TAKE 1 TABLET BY MOUTH TWICE A DAY 05/10/23   Croitoru, Mihai, MD  fluticasone  (FLONASE ) 50 MCG/ACT nasal spray Place 2 sprays into both nostrils daily. 02/10/23   Donnie Galea, MD  loperamide  (IMODIUM ) 2 MG capsule Take 2 mg by mouth 3 (three) times daily as needed for diarrhea or loose stools.     [provider]  loratadine (CLARITIN) 10 MG tablet Take 10 mg by mouth daily as  needed for allergies.    [provider]  metoprolol  (TOPROL  XL) 200 MG 24 hr tablet Take 1 tablet (200 mg total) by mouth daily. 01/31/23   [facility], Warren Haber, PA  ondansetron  (ZOFRAN ) 4 MG tablet Take 1 tablet (4 mg total) by mouth every 8 (eight) hours as needed for nausea or vomiting. 11/15/22   Donnie Galea, MD  pantoprazole  (PROTONIX ) 20 MG tablet TAKE 1 TABLET BY MOUTH EVERY DAY 03/14/23   Donnie Galea, MD  potassium chloride  SA (KLOR-CON  M20) 20 MEQ tablet TAKE 2 TABLETS BY MOUTH DAILY 06/09/23   Croitoru, Mihai, MD  Povidone (IVIZIA DRY EYES OP) Apply 2 drops to eye Once PRN.    [provider]  torsemide  (DEMADEX ) 20 MG tablet Take 1 tablet (20 mg total) by mouth daily. 07/24/23   Donnie Galea, MD  venlafaxine   XR (EFFEXOR -XR) 37.5 MG 24 hr capsule TAKE 2 CAPSULES (75 MG TOTAL) BY MOUTH EVERY MORNING. 06/13/23   Donnie Galea, MD      Allergies    Ace inhibitors, Warfarin sodium, Famotidine , Vancomycin , Codeine, Delsym [dextromethorphan polistirex er], Lactose intolerance (gi), Lasix  [furosemide ], Phenylephrine , and Sulfamethoxazole-trimethoprim    Review of Systems   Review of Systems  Respiratory:  Negative for shortness of breath.   Cardiovascular:  Negative for chest pain.  Gastrointestinal:  Negative for abdominal pain.  Musculoskeletal:  Positive for arthralgias and joint swelling.  Neurological:  Negative for headaches.  All other systems reviewed and are negative.   Physical Exam Updated Vital Signs BP 102/61   Pulse 77   Temp 98.1 F (36.7 C) (Oral)   Resp 18   Ht 5\' 5"  (1.651 m)   Wt 64.9 kg   SpO2 96%   BMI 23.80 kg/m  Physical Exam Vitals and nursing note reviewed.  Constitutional:      General: She is not in acute distress.    Appearance: Normal appearance. She is well-developed.  HENT:     Head: Normocephalic and atraumatic.     Nose: Nose normal.  Eyes:     Pupils: Pupils are equal, round, and reactive to light.  Cardiovascular:     Rate and Rhythm: Normal rate. Rhythm irregular.     Pulses: Normal pulses.     Heart sounds: Normal heart sounds.  Pulmonary:     Effort: No respiratory distress.     Breath sounds: Normal breath sounds.  Abdominal:     General: Bowel sounds are normal. There is no distension.     Palpations: Abdomen is soft.     Tenderness: There is no abdominal tenderness. There is no guarding or rebound.  Genitourinary:    Vagina: No vaginal discharge.  Musculoskeletal:        General: Tenderness present. Normal range of motion.     Right wrist: Swelling, deformity and tenderness present. Normal pulse.     Right hand: Normal.     Cervical back: Neck supple.  Skin:    Capillary Refill: Capillary refill takes less than 2 seconds.      Findings: No erythema or rash.  Neurological:     General: No focal deficit present.     Mental Status: She is alert and oriented to person, place, and time.     Deep Tendon Reflexes: Reflexes normal.  Psychiatric:        Mood and Affect: Mood normal.     ED Results / Procedures / Treatments   Labs (all labs ordered are listed,  but only abnormal results are displayed) Results for orders placed or performed during the hospital encounter of 08/05/23  CBC with Differential   Collection Time: 08/06/23  2:48 AM  Result Value Ref Range   WBC 11.2 (H) 4.0 - 10.5 K/uL   RBC 3.92 3.87 - 5.11 MIL/uL   Hemoglobin 12.4 12.0 - 15.0 g/dL   HCT 16.1 09.6 - 04.5 %   MCV 98.0 80.0 - 100.0 fL   MCH 31.6 26.0 - 34.0 pg   MCHC 32.3 30.0 - 36.0 g/dL   RDW 40.9 81.1 - 91.4 %   Platelets 207 150 - 400 K/uL   nRBC 0.0 0.0 - 0.2 %   Neutrophils Relative % 80 %   Neutro Abs 9.0 (H) 1.7 - 7.7 K/uL   Lymphocytes Relative 12 %   Lymphs Abs 1.3 0.7 - 4.0 K/uL   Monocytes Relative 7 %   Monocytes Absolute 0.7 0.1 - 1.0 K/uL   Eosinophils Relative 0 %   Eosinophils Absolute 0.0 0.0 - 0.5 K/uL   Basophils Relative 0 %   Basophils Absolute 0.0 0.0 - 0.1 K/uL   Immature Granulocytes 1 %   Abs Immature Granulocytes 0.07 0.00 - 0.07 K/uL  I-stat chem 8, ED (not at [facility], [facility] or [facility]   Collection Time: 08/06/23  2:53 AM  Result Value Ref Range   Sodium 140 135 - 145 mmol/L   Potassium 3.8 3.5 - 5.1 mmol/L   Chloride 100 98 - 111 mmol/L   BUN 39 (H) 8 - 23 mg/dL   Creatinine, Ser 7.82 0.44 - 1.00 mg/dL   Glucose, Bld 956 (H) 70 - 99 mg/dL   Calcium , Ion 1.17 1.15 - 1.40 mmol/L   TCO2 30 22 - 32 mmol/L   Hemoglobin 12.9 12.0 - 15.0 g/dL   HCT 21.3 08.6 - 57.8 %   DG Wrist Complete Right Addendum Date: 08/06/2023 ADDENDUM REPORT: 08/06/2023 02:43 ADDENDUM: The images are not labeled correctly on the lateral view. However, the images are of the right wrist and the dictation refers to the images of the  right wrist. No other changes are made to this report. Electronically Signed   By: Worthy Heads M.D.   On: 08/06/2023 02:43   Result Date: 08/06/2023 CLINICAL DATA:  Fall, right wrist injury EXAM: RIGHT WRIST - COMPLETE 3+ VIEW COMPARISON:  None Available. FINDINGS: Comminuted intra-articular, impacted fracture of the distal left radius is seen with roughly 1 cm override, 1/2 shaft with dorsal displacement, and 50-60 degrees dorsal angulation of the distal fracture fragment. Fracture plane extends into the distal radial articular surface with a 3 mm articular gap best appreciated lateral examination. Marked dorsal tilt the distal radial articular surface. No dislocation. Resultant ulnar positive variance. Extensive surrounding soft tissue swelling. IMPRESSION: 1. Comminuted, impacted, angulated intra-articular fracture of the distal left radius. Electronically Signed: By: Worthy Heads M.D. On: 08/05/2023 23:57   DG Wrist Complete Right Result Date: 08/06/2023 CLINICAL DATA:  Post reduction EXAM: RIGHT WRIST - COMPLETE 3+ VIEW COMPARISON:  08/05/2023 FINDINGS: Comminuted, intra-articular distal radial impaction fracture. Mild dorsal displacement of the distal fracture fragments on the lateral view, although with improved alignment. Overlying cast obscures fine osseous detail. IMPRESSION: Distal radial fracture with improved alignment, as above. Electronically Signed   By: Zadie Herter M.D.   On: 08/06/2023 02:26   CT Head Wo Contrast Result Date: 08/06/2023 CLINICAL DATA:  Fall EXAM: CT HEAD WITHOUT CONTRAST CT CERVICAL SPINE WITHOUT CONTRAST TECHNIQUE: Multidetector  CT imaging of the head and cervical spine was performed following the standard protocol without intravenous contrast. Multiplanar CT image reconstructions of the cervical spine were also generated. RADIATION DOSE REDUCTION: This exam was performed according to the departmental dose-optimization program which includes automated exposure  control, adjustment of the mA and/or kV according to patient size and/or use of iterative reconstruction technique. COMPARISON:  06/26/2022 FINDINGS: CT HEAD FINDINGS Brain: No evidence of acute infarction, hemorrhage, hydrocephalus, extra-axial collection or mass lesion/mass effect. Subcortical white matter and periventricular small vessel ischemic changes. Vascular: No hyperdense vessel or unexpected calcification. Skull: Normal. Negative for fracture or focal lesion. Sinuses/Orbits: The visualized paranasal sinuses are essentially clear. The mastoid air cells are unopacified. Other: None. CT CERVICAL SPINE FINDINGS Alignment: Normal cervical lordosis. Skull base and vertebrae: No acute fracture. No primary bone lesion or focal pathologic process. Soft tissues and spinal canal: No prevertebral fluid or swelling. No visible canal hematoma. Disc levels: Mild degenerative changes of the mid/lower cervical spine. Spinal canal is patent. Upper chest: Visualized lung apices are clear. Other: None. IMPRESSION: No acute intracranial abnormality. Small vessel ischemic changes. No traumatic injury to the cervical spine. Mild degenerative changes. Electronically Signed   By: Zadie Herter M.D.   On: 08/06/2023 01:21   CT Cervical Spine Wo Contrast Result Date: 08/06/2023 CLINICAL DATA:  Fall EXAM: CT HEAD WITHOUT CONTRAST CT CERVICAL SPINE WITHOUT CONTRAST TECHNIQUE: Multidetector CT imaging of the head and cervical spine was performed following the standard protocol without intravenous contrast. Multiplanar CT image reconstructions of the cervical spine were also generated. RADIATION DOSE REDUCTION: This exam was performed according to the departmental dose-optimization program which includes automated exposure control, adjustment of the mA and/or kV according to patient size and/or use of iterative reconstruction technique. COMPARISON:  06/26/2022 FINDINGS: CT HEAD FINDINGS Brain: No evidence of acute infarction,  hemorrhage, hydrocephalus, extra-axial collection or mass lesion/mass effect. Subcortical white matter and periventricular small vessel ischemic changes. Vascular: No hyperdense vessel or unexpected calcification. Skull: Normal. Negative for fracture or focal lesion. Sinuses/Orbits: The visualized paranasal sinuses are essentially clear. The mastoid air cells are unopacified. Other: None. CT CERVICAL SPINE FINDINGS Alignment: Normal cervical lordosis. Skull base and vertebrae: No acute fracture. No primary bone lesion or focal pathologic process. Soft tissues and spinal canal: No prevertebral fluid or swelling. No visible canal hematoma. Disc levels: Mild degenerative changes of the mid/lower cervical spine. Spinal canal is patent. Upper chest: Visualized lung apices are clear. Other: None. IMPRESSION: No acute intracranial abnormality. Small vessel ischemic changes. No traumatic injury to the cervical spine. Mild degenerative changes. Electronically Signed   By: Zadie Herter M.D.   On: 08/06/2023 01:21    EKG EKG Interpretation Date/Time:  Saturday Khaila Velarde 26 2025 22:41:36 EDT Ventricular Rate:  106 PR Interval:    QRS Duration:  144 QT Interval:  332 QTC Calculation: 441 R Axis:   268  Text Interpretation: Atrial fibrillation with rapid ventricular response with a competing junctional pacemaker Right superior axis deviation Left bundle branch block Abnormal ECG When compared with ECG of 08-Jun-2023 16:06, PREVIOUS ECG IS PRESENT Confirmed by Townsend Freud [phone]) on 08/05/2023 11:13:37 PM  Radiology CT Head Wo Contrast Result Date: 08/06/2023 CLINICAL DATA:  Fall EXAM: CT HEAD WITHOUT CONTRAST CT CERVICAL SPINE WITHOUT CONTRAST TECHNIQUE: Multidetector CT imaging of the head and cervical spine was performed following the standard protocol without intravenous contrast. Multiplanar CT image reconstructions of the cervical spine were also generated. RADIATION DOSE REDUCTION: This  exam was  performed according to the departmental dose-optimization program which includes automated exposure control, adjustment of the mA and/or kV according to patient size and/or use of iterative reconstruction technique. COMPARISON:  06/26/2022 FINDINGS: CT HEAD FINDINGS Brain: No evidence of acute infarction, hemorrhage, hydrocephalus, extra-axial collection or mass lesion/mass effect. Subcortical white matter and periventricular small vessel ischemic changes. Vascular: No hyperdense vessel or unexpected calcification. Skull: Normal. Negative for fracture or focal lesion. Sinuses/Orbits: The visualized paranasal sinuses are essentially clear. The mastoid air cells are unopacified. Other: None. CT CERVICAL SPINE FINDINGS Alignment: Normal cervical lordosis. Skull base and vertebrae: No acute fracture. No primary bone lesion or focal pathologic process. Soft tissues and spinal canal: No prevertebral fluid or swelling. No visible canal hematoma. Disc levels: Mild degenerative changes of the mid/lower cervical spine. Spinal canal is patent. Upper chest: Visualized lung apices are clear. Other: None. IMPRESSION: No acute intracranial abnormality. Small vessel ischemic changes. No traumatic injury to the cervical spine. Mild degenerative changes. Electronically Signed   By: Zadie Herter M.D.   On: 08/06/2023 01:21   CT Cervical Spine Wo Contrast Result Date: 08/06/2023 CLINICAL DATA:  Fall EXAM: CT HEAD WITHOUT CONTRAST CT CERVICAL SPINE WITHOUT CONTRAST TECHNIQUE: Multidetector CT imaging of the head and cervical spine was performed following the standard protocol without intravenous contrast. Multiplanar CT image reconstructions of the cervical spine were also generated. RADIATION DOSE REDUCTION: This exam was performed according to the departmental dose-optimization program which includes automated exposure control, adjustment of the mA and/or kV according to patient size and/or use of iterative reconstruction  technique. COMPARISON:  06/26/2022 FINDINGS: CT HEAD FINDINGS Brain: No evidence of acute infarction, hemorrhage, hydrocephalus, extra-axial collection or mass lesion/mass effect. Subcortical white matter and periventricular small vessel ischemic changes. Vascular: No hyperdense vessel or unexpected calcification. Skull: Normal. Negative for fracture or focal lesion. Sinuses/Orbits: The visualized paranasal sinuses are essentially clear. The mastoid air cells are unopacified. Other: None. CT CERVICAL SPINE FINDINGS Alignment: Normal cervical lordosis. Skull base and vertebrae: No acute fracture. No primary bone lesion or focal pathologic process. Soft tissues and spinal canal: No prevertebral fluid or swelling. No visible canal hematoma. Disc levels: Mild degenerative changes of the mid/lower cervical spine. Spinal canal is patent. Upper chest: Visualized lung apices are clear. Other: None. IMPRESSION: No acute intracranial abnormality. Small vessel ischemic changes. No traumatic injury to the cervical spine. Mild degenerative changes. Electronically Signed   By: Zadie Herter M.D.   On: 08/06/2023 01:21   DG Wrist Complete Right Result Date: 08/05/2023 CLINICAL DATA:  Fall, right wrist injury EXAM: RIGHT WRIST - COMPLETE 3+ VIEW COMPARISON:  None Available. FINDINGS: Comminuted intra-articular, impacted fracture of the distal left radius is seen with roughly 1 cm override, 1/2 shaft with dorsal displacement, and 50-60 degrees dorsal angulation of the distal fracture fragment. Fracture plane extends into the distal radial articular surface with a 3 mm articular gap best appreciated lateral examination. Marked dorsal tilt the distal radial articular surface. No dislocation. Resultant ulnar positive variance. Extensive surrounding soft tissue swelling. IMPRESSION: 1. Comminuted, impacted, angulated intra-articular fracture of the distal left radius. Electronically Signed   By: Worthy Heads M.D.   On:  08/05/2023 23:57    Procedures .Ortho Injury Treatment  Date/Time: 08/06/2023 2:26 AM  Performed by: Aqeel Norgaard, MD Authorized by: Tonya Fredrickson, MD   Consent:    Consent obtained:  Verbal   Consent given by:  Patient   Risks discussed:  Fracture  Alternatives discussed:  Delayed treatmentInjury location: right wrist. Pre-procedure neurovascular assessment: neurovascularly intact Pre-procedure distal perfusion: normal Pre-procedure neurological function: normal Pre-procedure range of motion: reduced  Anesthesia: Local anesthesia used: no  Patient sedated: NoImmobilization: sling and splint Splint type: sugar tong Splint Applied by: ED Provider and Ortho Tech Supplies used: Ortho-Glass Post-procedure neurovascular assessment: post-procedure neurovascularly intact Post-procedure distal perfusion: normal Post-procedure neurological function: normal Post-procedure range of motion: improved       Medications Ordered in ED Medications  HYDROcodone -acetaminophen  (NORCO/VICODIN) 5-325 MG per tablet 1 tablet (1 tablet Oral Given 08/05/23 2251)  fentaNYL  (SUBLIMAZE ) injection 50 mcg (50 mcg Intravenous Given 08/06/23 0200)  ketorolac  (TORADOL ) 30 MG/ML injection 15 mg (15 mg Intravenous Given 08/06/23 0159)    ED Course/ Medical Decision Making/ A&P                                 Medical Decision Making FOOSH  Amount and/or Complexity of Data Reviewed External Data Reviewed: notes.    Details: Previous notes reviewed  Labs: ordered.    Details: White count slight elevation 11.2, normal hemoglobin 12.4, normal platelets.  Normal sodium 140, normal potassium 3.8, normal creatinine  Radiology: ordered.    Details: Improved alignment of fracture post reduction by me  ECG/medicine tests: ordered and independent interpretation performed. Decision-making details documented in ED Course. Discussion of management or test interpretation with external provider(s): Case d/w Dr.  Farrell Honey of hand surgery, please admit to medicine hand surgery will consult.    Risk Prescription drug management.    Final Clinical Impression(s) / ED Diagnoses Final diagnoses:  Fall, initial encounter  Closed fracture of right wrist, initial encounter   The patient appears reasonably stabilized for admission considering the current resources, flow, and capabilities available in the ED at this time, and I doubt any other [facility] requiring further screening and/or treatment in the ED prior to admission.  Rx / DC Orders ED Discharge Orders     None         Dayshawn Irizarry, MD 08/06/23 [phone]

## 2023-08-06 NOTE — ED Notes (Signed)
 Right arm reduction completed by Dr. Maralee Senate and Merrily Able (Ortho tech). This RN remained at bedside to assist. Pt remained Aox4. Able to tolerate procedure.

## 2023-08-06 NOTE — Consult Note (Addendum)
 CHART REVIEWED PT WITH MECHANICAL FALL ON RIGHT WRIST PT WITH CLOSED RIGHT DISTAL RADIUS FRACTURE PT UNDERWENT MANIPULATION AND SPLINTING AT [facility] REVIEWED WILL DISCUSS WITH PATIENT OPTIONS  PT WITH UNSTABLE AND DISPLACED INTRA-ARTICULAR FRACTURE WOULD RECOMMEND SURGERY PT ON ELIQUIS  WOULD CONSIDER SURGERY ON WEDNESDAY IF PATIENT FAMILY AGREE WILL FOLLOW AS INPATIENT  WENT BY AND SAW PATIENT PT COULD GO HOME AS SURGERY LIKELY ON WEDNESDAY CONTINUE WITH CURRENT SPLINT LEFT MY NUMBER IN ROOM FOR DAUGHTER TO CALL IF SHE HAD QUESTIONS SO PLAN NOW IS FOR SURGERY ON WEDNESDAY  PLEASE CONTACT ME IF ANY QUESTIONS

## 2023-08-06 NOTE — Plan of Care (Signed)
  Problem: Education: Goal: Knowledge of General Education information will improve Description: Including pain rating scale, medication(s)/side effects and non-pharmacologic comfort measures Outcome: Progressing   Problem: Health Behavior/Discharge Planning: Goal: Ability to manage health-related needs will improve Outcome: Progressing   Problem: Clinical Measurements: Goal: Ability to maintain clinical measurements within normal limits will improve Outcome: Progressing Goal: Will remain free from infection Outcome: Progressing Goal: Diagnostic test results will improve Outcome: Progressing Goal: Respiratory complications will improve Outcome: Progressing Goal: Cardiovascular complication will be avoided Outcome: Progressing   Problem: Coping: Goal: Level of anxiety will decrease Outcome: Progressing   Problem: Nutrition: Goal: Adequate nutrition will be maintained Outcome: Progressing   Problem: Elimination: Goal: Will not experience complications related to bowel motility Outcome: Progressing Goal: Will not experience complications related to urinary retention Outcome: Progressing   Problem: Safety: Goal: Ability to remain free from injury will improve Outcome: Progressing   Problem: Pain Managment: Goal: General experience of comfort will improve and/or be controlled Outcome: Progressing   Problem: Skin Integrity: Goal: Risk for impaired skin integrity will decrease Outcome: Progressing

## 2023-08-06 NOTE — ED Notes (Signed)
 Son in lobby contact and updated on current POC. Pt to be admitted to the hospital.

## 2023-08-06 NOTE — ED Notes (Signed)
 [employer] called 2x. NA.

## 2023-08-06 NOTE — Progress Notes (Signed)
 Orthopedic Tech Progress Note Patient Details:  [name] Jan 16, 1931 [id number]  Ortho Devices Type of Ortho Device: Sugartong splint, Arm sling Ortho Device/Splint Location: rue Ortho Device/Splint Interventions: Ordered, Application, Adjustment  I applied splint post reduction with drs assist. Post Interventions Patient Tolerated: Well Instructions Provided: Care of device, Adjustment of device  [name] 08/06/2023, 2:56 AM

## 2023-08-06 NOTE — ED Notes (Signed)
 Pt to CT via stretcher

## 2023-08-06 NOTE — H&P (Signed)
 History and Physical    [name] UXL:[id number] DOB: July 29, 1930 DOA: 08/05/2023  PCP: Donnie Galea, MD   Patient coming from: Home   Chief Complaint: Fall with right wrist injury   HPI: [name] is a pleasant 89 y.o. female with medical history significant for atrial fibrillation on Eliquis , chronic HFrEF, depression, and anxiety, now presenting with right wrist injury after a fall.  Patient reports that she was in her usual state of health and had had an uneventful day when she was turning around, felt as though her knee gave way from under her, and she fell onto her right arm.  She experienced immediate pain and a deformity was noted.  She denies hitting her head or losing consciousness.  She denies any other appreciable injury.  She has not experienced any recent chest pain or shortness of breath and denies any recent leg swelling, fever, or chills.  She last took Eliquis  the morning of 08/05/2023.  ED Course: Upon arrival to the ED, patient is found to be afebrile and saturating mid 90s on room air with normal HR and stable BP.  EKG demonstrates atrial fibrillation with RAD and chronic LBBB.  Plain radiographs of the wrist reveal comminuted, impacted, and angulated intra-articular fracture of the distal right radius.  There were no acute findings on CT head or CT cervical spine.    The wrist was reduced by the ED physician and subsequent radiographs demonstrate improved alignment.  Hand surgery (Dr. Primus Brookes) was consulted by the ED physician and the patient was treated with fentanyl , Norco, and Toradol .  Review of Systems:  All other systems reviewed and apart from HPI, are negative.  Past Medical History:  Diagnosis Date   Anemia    Anxiety    Arthritis    "knees" (06/18/2015)   Atrial fibrillation with RVR (HCC) 06/18/2015   h/o sig bleed on coumadin   Cardiogenic shock 05/02/2017   Complication of anesthesia 2010   "w/cardiac cath; got into a psychotic state for  ~ 3 days; got me out w/Valium"   Depression    "I have it off and on; not as often as I've gotten older" (06/18/2015)   Diverticulosis    descending and sigmoid colon--Dr. Adan Holms   DJD (degenerative joint disease) of knee    left   Dyspnea    with exertion and in morning mostly when wakes up    E coli infection    History of   GERD (gastroesophageal reflux disease)    History of blood transfusion    History of Clostridium difficile colitis    IBS (irritable bowel syndrome)    Insomnia     off chronic ambien  5mg  as of 10/11, rare/episodic use since then  DCM/CHF   LBBB (left bundle branch block)    Lymphocytic colitis 09/27/2016   Moderate to severe pulmonary hypertension (HCC) 11/26/2019   Nonischemic cardiomyopathy (HCC)    normal coronaries, EF 15-20% 04/2008, EF 50-55% 2015   Osteoporosis    on DXA 05/2010   Positive PPD    Sciatica    Right   Urinary incontinence    Occassionally    Past Surgical History:  Procedure Laterality Date   ANTERIOR APPROACH HEMI HIP ARTHROPLASTY Left 08/05/2019   Procedure: ANTERIOR APPROACH HEMI HIP ARTHROPLASTY;  Surgeon: Neil Balls, MD;  Location: [facility] OR;  Service: Orthopedics;  Laterality: Left;   BIOPSY  11/22/2017   Procedure: BIOPSY;  Surgeon: Kenney Peacemaker, MD;  Location: [facility]  ENDOSCOPY;  Service: Endoscopy;;   CARDIAC CATHETERIZATION  2010   CARDIOVERSION N/A 06/19/2015   Procedure: CARDIOVERSION;  Surgeon: Darlis Eisenmenger, MD;  Location: [facility] ENDOSCOPY;  Service: Cardiovascular;  Laterality: N/A;   CATARACT EXTRACTION W/ INTRAOCULAR LENS  IMPLANT, BILATERAL Bilateral    COLONOSCOPY WITH PROPOFOL  N/A 11/22/2017   Procedure: COLONOSCOPY WITH PROPOFOL ;  Surgeon: Kenney Peacemaker, MD;  Location: [facility] ENDOSCOPY;  Service: Endoscopy;  Laterality: N/A;   DILATION AND CURETTAGE OF UTERUS     ORIF ANKLE FRACTURE Right 09/22/2020   Procedure: RIGHT ANKLE OPEN REDUCTION INTERNAL FIXATION (ORIF);  Surgeon: Dayne Even, MD;  Location: [facility] ORS;   Service: Orthopedics;  Laterality: Right;   ORIF TIBIA PLATEAU Right 06/28/2022   Procedure: OPEN REDUCTION INTERNAL FIXATION (ORIF) TIBIA;  Surgeon: Laneta Pintos, MD;  Location: [facility] OR;  Service: Orthopedics;  Laterality: Right;   TEE WITHOUT CARDIOVERSION N/A 06/19/2015   Procedure: TRANSESOPHAGEAL ECHOCARDIOGRAM (TEE);  Surgeon: Darlis Eisenmenger, MD;  Location: [facility] ENDOSCOPY;  Service: Cardiovascular;  Laterality: N/A;   TOTAL KNEE ARTHROPLASTY Left 04/25/2017   Procedure: TOTAL KNEE ARTHROPLASTY;  Surgeon: Dayne Even, MD;  Location: [facility] OR;  Service: Orthopedics;  Laterality: Left;   TOTAL KNEE ARTHROPLASTY Right 02/18/2020   Procedure: RIGHT TOTAL KNEE ARTHROPLASTY;  Surgeon: Dayne Even, MD;  Location: [facility] ORS;  Service: Orthopedics;  Laterality: Right;   TOTAL KNEE ARTHROPLASTY Right 06/16/2020   Procedure: RIGHT RETINACULAR REPAIR;  Surgeon: Dayne Even, MD;  Location: [facility] ORS;  Service: Orthopedics;  Laterality: Right;   TOTAL KNEE REVISION Left 12/03/2019   Procedure: LEFT TOTAL KNEE REVISION  OF POYLY EXCHANGE;  Surgeon: Dayne Even, MD;  Location: [facility] ORS;  Service: Orthopedics;  Laterality: Left;   VAGINAL HYSTERECTOMY  1979   ovaries intact    Social History:   reports that she has never smoked. She has never used smokeless tobacco. She reports that she does not drink alcohol  and does not use drugs.  Allergies  Allergen Reactions   Ace Inhibitors Cough   Warfarin Sodium Other (See Comments)    DOSE RELATED PHARMACOLOGIC EFFECT "bleed out"   Famotidine  Other (See Comments)    GI upset   Vancomycin  Nausea Only   Codeine Other (See Comments)    sedation   Delsym [Dextromethorphan Polistirex Er] Other (See Comments)    dizziness   Lactose Intolerance (Gi) Diarrhea   Lasix  [Furosemide ] Diarrhea    Oral Lasix  gives her diarrhea, tolerates IV Rx   Phenylephrine  Palpitations and Other (See Comments)    Nasal spray- "likely increase in nasal congestion".     Sulfamethoxazole-Trimethoprim Nausea And Vomiting    GI intolerance.    Family History  Problem Relation Age of Onset   Colon cancer Father        possible colon cancer   Other Father        esophagus tumor?   Tuberculosis Mother    Breast cancer Neg Hx    Stomach cancer Neg Hx    Pancreatic cancer Neg Hx      Prior to Admission medications   Medication Sig Start Date End Date Taking? Authorizing Provider  acetaminophen  (TYLENOL ) 500 MG tablet Take 2 tablets (1,000 mg total) by mouth every 8 (eight) hours as needed. 07/07/22  Yes Donnie Galea, MD  calcium  carbonate (TUMS - DOSED IN MG ELEMENTAL CALCIUM ) 500 MG chewable tablet Chew 500 mg by mouth 2 (two) times daily as needed for indigestion or heartburn.   Yes  [provider]  cholecalciferol  (VITAMIN D3) 25 MCG (1000 UNIT) tablet Take 1,000 Units by mouth in the morning.   Yes [provider]  digoxin  (LANOXIN ) 0.125 MG tablet Take 1 tablet (0.125 mg total) by mouth every Monday, Wednesday, and Friday. 11/23/22  Yes Croitoru, Mihai, MD  fluticasone  (FLONASE ) 50 MCG/ACT nasal spray Place 2 sprays into both nostrils daily. 02/10/23  Yes Donnie Galea, MD  loperamide  (IMODIUM ) 2 MG capsule Take 2 mg by mouth 3 (three) times daily as needed for diarrhea or loose stools.    Yes [provider]  loratadine (CLARITIN) 10 MG tablet Take 10 mg by mouth daily as needed for allergies.   Yes [provider]  metoprolol  (TOPROL  XL) 200 MG 24 hr tablet Take 1 tablet (200 mg total) by mouth daily. 01/31/23  Yes Duke, Warren Haber, PA  ondansetron  (ZOFRAN ) 4 MG tablet Take 1 tablet (4 mg total) by mouth every 8 (eight) hours as needed for nausea or vomiting. 11/15/22  Yes Donnie Galea, MD  pantoprazole  (PROTONIX ) 20 MG tablet TAKE 1 TABLET BY MOUTH EVERY DAY 03/14/23  Yes Donnie Galea, MD  potassium chloride  SA (KLOR-CON  M20) 20 MEQ tablet TAKE 2 TABLETS BY MOUTH DAILY Patient taking differently: Take 20  mEq by mouth 2 (two) times daily. 06/09/23  Yes Croitoru, Mihai, MD  Povidone (IVIZIA DRY EYES OP) Apply 2 drops to eye Once PRN.   Yes [provider]  torsemide  (DEMADEX ) 20 MG tablet Take 1 tablet (20 mg total) by mouth daily. 07/24/23  Yes Donnie Galea, MD  venlafaxine  XR (EFFEXOR -XR) 37.5 MG 24 hr capsule TAKE 2 CAPSULES (75 MG TOTAL) BY MOUTH EVERY MORNING. 06/13/23  Yes Donnie Galea, MD  ELIQUIS  2.5 MG TABS tablet TAKE 1 TABLET BY MOUTH TWICE A DAY Patient not taking: Reported on 08/06/2023 05/10/23   Croitoru, Karyl Paget, MD    Physical Exam: Vitals:   08/06/23 0015 08/06/23 0030 08/06/23 0045 08/06/23 0200  BP:  107/61  102/61  Pulse:  87 77   Resp:      Temp:    98.1 F (36.7 C)  TempSrc:    Oral  SpO2: 95%   96%  Weight:      Height:        Constitutional: NAD, calm  Eyes: PERTLA, lids and conjunctivae normal ENMT: Mucous membranes are moist. Posterior pharynx clear of any exudate or lesions.   Neck: supple, no masses  Respiratory: no wheezing, no crackles. No accessory muscle use.  Cardiovascular: S1 & S2 heard, regular rate and rhythm. Trace lower extremity edema.   Abdomen: No tenderness, soft. Bowel sounds active.  Musculoskeletal: no clubbing / cyanosis. Distal RUE immobilized, neurovascularly intact.   Skin: no significant rashes, lesions, ulcers. Warm, dry, well-perfused. Neurologic: CN 2-12 grossly intact. Moving all extremities. Alert and oriented to person, place, and situation.  Psychiatric: Pleasant. Cooperative.    Labs and Imaging on Admission: I have personally reviewed following labs and imaging studies  CBC: Recent Labs  Lab 08/06/23 0248 08/06/23 0253  WBC 11.2*  --   NEUTROABS 9.0*  --   HGB 12.4 12.9  HCT 38.4 38.0  MCV 98.0  --   PLT 207  --    Basic Metabolic Panel: Recent Labs  Lab 08/06/23 0253  NA 140  K 3.8  CL 100  GLUCOSE 132*  BUN 39*  CREATININE 1.00   GFR: Estimated Creatinine Clearance: 33.6 mL/min (by C-G  formula  based on SCr of 1 mg/dL). Liver Function Tests: No results for input(s): "AST", "ALT", "ALKPHOS", "BILITOT", "PROT", "ALBUMIN " in the last 168 hours. No results for input(s): "LIPASE", "AMYLASE" in the last 168 hours. No results for input(s): "AMMONIA" in the last 168 hours. Coagulation Profile: No results for input(s): "INR", "PROTIME" in the last 168 hours. Cardiac Enzymes: No results for input(s): "CKTOTAL", "CKMB", "CKMBINDEX", "TROPONINI" in the last 168 hours. BNP (last 3 results) Recent Labs    11/15/22 1153  PROBNP 877.0*   HbA1C: No results for input(s): "HGBA1C" in the last 72 hours. CBG: No results for input(s): "GLUCAP" in the last 168 hours. Lipid Profile: No results for input(s): "CHOL", "HDL", "LDLCALC", "TRIG", "CHOLHDL", "LDLDIRECT" in the last 72 hours. Thyroid  Function Tests: No results for input(s): "TSH", "T4TOTAL", "FREET4", "T3FREE", "THYROIDAB" in the last 72 hours. Anemia Panel: No results for input(s): "VITAMINB12", "FOLATE", "FERRITIN", "TIBC", "IRON", "RETICCTPCT" in the last 72 hours. Urine analysis:    Component Value Date/Time   COLORURINE YELLOW 04/29/2023 0623   APPEARANCEUR HAZY (A) 04/29/2023 0623   LABSPEC 1.014 04/29/2023 0623   PHURINE 5.0 04/29/2023 0623   GLUCOSEU NEGATIVE 04/29/2023 0623   GLUCOSEU NEGATIVE 01/25/2021 1419   HGBUR SMALL (A) 04/29/2023 0623   HGBUR moderate 06/01/2010 1102   BILIRUBINUR NEGATIVE 04/29/2023 0623   BILIRUBINUR neg 04/22/2022 1250   KETONESUR NEGATIVE 04/29/2023 0623   PROTEINUR NEGATIVE 04/29/2023 0623   UROBILINOGEN 0.2 04/22/2022 1250   UROBILINOGEN 0.2 01/25/2021 1419   NITRITE POSITIVE (A) 04/29/2023 0623   LEUKOCYTESUR MODERATE (A) 04/29/2023 0623   Sepsis Labs: @LABRCNTIP (procalcitonin:4,lacticidven:4) )No results found for this or any previous visit (from the past 240 hours).   Radiological Exams on Admission: DG Wrist Complete Right Addendum Date: 08/06/2023 ADDENDUM REPORT:  08/06/2023 02:43 ADDENDUM: The images are not labeled correctly on the lateral view. However, the images are of the right wrist and the dictation refers to the images of the right wrist. No other changes are made to this report. Electronically Signed   By: Worthy Heads M.D.   On: 08/06/2023 02:43   Result Date: 08/06/2023 CLINICAL DATA:  Fall, right wrist injury EXAM: RIGHT WRIST - COMPLETE 3+ VIEW COMPARISON:  None Available. FINDINGS: Comminuted intra-articular, impacted fracture of the distal left radius is seen with roughly 1 cm override, 1/2 shaft with dorsal displacement, and 50-60 degrees dorsal angulation of the distal fracture fragment. Fracture plane extends into the distal radial articular surface with a 3 mm articular gap best appreciated lateral examination. Marked dorsal tilt the distal radial articular surface. No dislocation. Resultant ulnar positive variance. Extensive surrounding soft tissue swelling. IMPRESSION: 1. Comminuted, impacted, angulated intra-articular fracture of the distal left radius. Electronically Signed: By: Worthy Heads M.D. On: 08/05/2023 23:57   DG Wrist Complete Right Result Date: 08/06/2023 CLINICAL DATA:  Post reduction EXAM: RIGHT WRIST - COMPLETE 3+ VIEW COMPARISON:  08/05/2023 FINDINGS: Comminuted, intra-articular distal radial impaction fracture. Mild dorsal displacement of the distal fracture fragments on the lateral view, although with improved alignment. Overlying cast obscures fine osseous detail. IMPRESSION: Distal radial fracture with improved alignment, as above. Electronically Signed   By: Zadie Herter M.D.   On: 08/06/2023 02:26   CT Head Wo Contrast Result Date: 08/06/2023 CLINICAL DATA:  Fall EXAM: CT HEAD WITHOUT CONTRAST CT CERVICAL SPINE WITHOUT CONTRAST TECHNIQUE: Multidetector CT imaging of the head and cervical spine was performed following the standard protocol without intravenous contrast. Multiplanar CT image reconstructions of the  cervical  spine were also generated. RADIATION DOSE REDUCTION: This exam was performed according to the departmental dose-optimization program which includes automated exposure control, adjustment of the mA and/or kV according to patient size and/or use of iterative reconstruction technique. COMPARISON:  06/26/2022 FINDINGS: CT HEAD FINDINGS Brain: No evidence of acute infarction, hemorrhage, hydrocephalus, extra-axial collection or mass lesion/mass effect. Subcortical white matter and periventricular small vessel ischemic changes. Vascular: No hyperdense vessel or unexpected calcification. Skull: Normal. Negative for fracture or focal lesion. Sinuses/Orbits: The visualized paranasal sinuses are essentially clear. The mastoid air cells are unopacified. Other: None. CT CERVICAL SPINE FINDINGS Alignment: Normal cervical lordosis. Skull base and vertebrae: No acute fracture. No primary bone lesion or focal pathologic process. Soft tissues and spinal canal: No prevertebral fluid or swelling. No visible canal hematoma. Disc levels: Mild degenerative changes of the mid/lower cervical spine. Spinal canal is patent. Upper chest: Visualized lung apices are clear. Other: None. IMPRESSION: No acute intracranial abnormality. Small vessel ischemic changes. No traumatic injury to the cervical spine. Mild degenerative changes. Electronically Signed   By: Zadie Herter M.D.   On: 08/06/2023 01:21   CT Cervical Spine Wo Contrast Result Date: 08/06/2023 CLINICAL DATA:  Fall EXAM: CT HEAD WITHOUT CONTRAST CT CERVICAL SPINE WITHOUT CONTRAST TECHNIQUE: Multidetector CT imaging of the head and cervical spine was performed following the standard protocol without intravenous contrast. Multiplanar CT image reconstructions of the cervical spine were also generated. RADIATION DOSE REDUCTION: This exam was performed according to the departmental dose-optimization program which includes automated exposure control, adjustment of the mA  and/or kV according to patient size and/or use of iterative reconstruction technique. COMPARISON:  06/26/2022 FINDINGS: CT HEAD FINDINGS Brain: No evidence of acute infarction, hemorrhage, hydrocephalus, extra-axial collection or mass lesion/mass effect. Subcortical white matter and periventricular small vessel ischemic changes. Vascular: No hyperdense vessel or unexpected calcification. Skull: Normal. Negative for fracture or focal lesion. Sinuses/Orbits: The visualized paranasal sinuses are essentially clear. The mastoid air cells are unopacified. Other: None. CT CERVICAL SPINE FINDINGS Alignment: Normal cervical lordosis. Skull base and vertebrae: No acute fracture. No primary bone lesion or focal pathologic process. Soft tissues and spinal canal: No prevertebral fluid or swelling. No visible canal hematoma. Disc levels: Mild degenerative changes of the mid/lower cervical spine. Spinal canal is patent. Upper chest: Visualized lung apices are clear. Other: None. IMPRESSION: No acute intracranial abnormality. Small vessel ischemic changes. No traumatic injury to the cervical spine. Mild degenerative changes. Electronically Signed   By: Zadie Herter M.D.   On: 08/06/2023 01:21    EKG: Independently reviewed. Atrial fibrillation, rate 106, RAD, chronic LBBB.   Assessment/Plan  1. Distal radius fracture  - Reduced by ED physician and immobilized    - Keep NPO and hold Eliquis  (last dose was AM of 4/26) pending hand surgery consultation, continue pain-control and supportive care    2. Atrial fibrillation  - Hold Eliquis , continue Toprol  and digoxin     3. Chronic HFrEF  - EF was 25-30% on echo from March 2024  - Appears compensated  - Continue torsemide , beta-blocker, digoxin     4. Depression, anxiety  - Continue Effexor     DVT prophylaxis: SCDs Code Status: DNR  Level of Care: Level of care: Telemetry Medical Family Communication: None present  Disposition Plan:  Patient is from: Home   Anticipated d/c is to: TBD Anticipated d/c date is: 08/08/23  Patient currently: Pending hand surgery consultation, then likely PT/OT, disposition planning  Consults called: Hand surgery Admission status: Inpatient  Walton Guppy, MD [facility]  08/06/2023, 4:37 AM

## 2023-08-07 DIAGNOSIS — S52571A Other intraarticular fracture of lower end of right radius, initial encounter for closed fracture: Secondary | ICD-10-CM | POA: Diagnosis not present

## 2023-08-07 LAB — BASIC METABOLIC PANEL WITH GFR
Anion gap: 11 (ref 5–15)
BUN: 35 mg/dL — ABNORMAL HIGH (ref 8–23)
CO2: 31 mmol/L (ref 22–32)
Calcium: 9.4 mg/dL (ref 8.9–10.3)
Chloride: 97 mmol/L — ABNORMAL LOW (ref 98–111)
Creatinine, Ser: 0.96 mg/dL (ref 0.44–1.00)
GFR, Estimated: 56 mL/min — ABNORMAL LOW (ref >60)
Glucose, Bld: 120 mg/dL — ABNORMAL HIGH (ref 70–99)
Potassium: 4 mmol/L (ref 3.5–5.1)
Sodium: 139 mmol/L (ref 135–145)

## 2023-08-07 LAB — CBC
HCT: 36 % (ref 36.0–46.0)
Hemoglobin: 11.8 g/dL — ABNORMAL LOW (ref 12.0–15.0)
MCH: 32.2 pg (ref 26.0–34.0)
MCHC: 32.8 g/dL (ref 30.0–36.0)
MCV: 98.1 fL (ref 80.0–100.0)
Platelets: 177 10*3/uL (ref 150–400)
RBC: 3.67 MIL/uL — ABNORMAL LOW (ref 3.87–5.11)
RDW: 13.2 % (ref 11.5–15.5)
WBC: 11 10*3/uL — ABNORMAL HIGH (ref 4.0–10.5)
nRBC: 0 % (ref 0.0–0.2)

## 2023-08-07 LAB — MAGNESIUM: Magnesium: 2.1 mg/dL (ref 1.7–2.4)

## 2023-08-07 MED ORDER — SODIUM CHLORIDE 0.45 % IV SOLN: INTRAVENOUS | Status: DC

## 2023-08-07 NOTE — Plan of Care (Signed)

## 2023-08-07 NOTE — Evaluation (Signed)
 Physical Therapy Evaluation Patient Details Name: [name] MRN: [id number] DOB: 1930/08/16 Today's Date: 08/07/2023  History of Present Illness  Pt is a 89 y.o. female presenting 4/26 with obvious deformity to R wrist following a fall. S/p R arm reduction and splinting 4/27. Plan for surgery 4/30. PMH significant for afib, chronic HFrEF, depression, anxiety, L hemi hip arthroplasty (2021), ORIF tibial plateau 2024.  Clinical Impression  Pt presents to PT with deficits in functional mobility, gait, balance, strength, power, activity tolerance. Pt is limited by discomfort at RUE along with generalized weakness. Pt reports a history of knee buckling, even with use of RW, which has resulted in many prior falls. Pt requires assistance from PT to mobilize in bed and to successfully stand. Pt remains at a high risk for falls due to LE weakness, and now she is unable to utilize the RUE for support (unless she is later cleared for weightbearing through RUE or elbow, no formal weightbearing restrictions are documented in the chart at this time). Pt reports her daughter was also recently injured and is unable to physically assist at this time. Patient will benefit from continued inpatient follow up therapy, <3 hours/day.        If plan is discharge home, recommend the following: A lot of help with walking and/or transfers;A lot of help with bathing/dressing/bathroom;Assistance with cooking/housework;Assist for transportation;Help with stairs or ramp for entrance   Can travel by private vehicle   No    Equipment Recommendations None recommended by PT  Recommendations for Other Services       Functional Status Assessment Patient has had a recent decline in their functional status and demonstrates the ability to make significant improvements in function in a reasonable and predictable amount of time.     Precautions / Restrictions Precautions Precautions: Fall Recall of Precautions/Restrictions:  Intact Required Braces or Orthoses: Sling Restrictions Weight Bearing Restrictions Per Provider Order: Yes Other Position/Activity Restrictions: no documented weightbearing restrictions noted, presume NWB to R hand/wrist as pt is in sling with wrist fx      Mobility  Bed Mobility Overal bed mobility: Needs Assistance Bed Mobility: Supine to Sit     Supine to sit: Min assist, HOB elevated          Transfers Overall transfer level: Needs assistance Equipment used: 1 person hand held assist Transfers: Sit to/from Stand, Bed to chair/wheelchair/BSC Sit to Stand: Min assist Stand pivot transfers: Min assist         General transfer comment: PT assist to power up into standing and to facilitate increased anterior weight shift    Ambulation/Gait                  Stairs            Wheelchair Mobility     Tilt Bed    Modified Rankin (Stroke Patients Only)       Balance Overall balance assessment: Needs assistance Sitting-balance support: No upper extremity supported, Feet supported Sitting balance-Leahy Scale: Good     Standing balance support: Single extremity supported, Reliant on assistive device for balance Standing balance-Leahy Scale: Poor                               Pertinent Vitals/Pain Pain Assessment Pain Assessment: Faces Faces Pain Scale: Hurts little more Pain Location: RUE Pain Descriptors / Indicators: Grimacing Pain Intervention(s): Monitored during session    Home Living Family/patient  expects to be discharged to:: Private residence Living Arrangements: Children Available Help at Discharge: Family;Available 24 hours/day Type of Home: House Home Access: Stairs to enter;Ramped entrance   Entrance Stairs-Number of Steps: typically uses ramp at front door   Home Layout: One level Home Equipment: Agricultural consultant (2 wheels);Wheelchair - manual;Transport chair;BSC/3in1;Grab bars - toilet;Grab bars - tub/shower;Hand  held shower head;Shower seat;Lift chair      Prior Function Prior Level of Function : Independent/Modified Independent;Needs assist             Mobility Comments: ambulatory with support of RW, pt reports he last fall was in 2024, does have a history of multiple falls with injury through the years ADLs Comments: At baseline, pt is Independent to Mod I with ADLs. Family assists with IADLs     Extremity/Trunk Assessment   Upper Extremity Assessment Upper Extremity Assessment: RUE deficits/detail RUE Deficits / Details: pt in sling, formal assessment deferred to OT    Lower Extremity Assessment Lower Extremity Assessment: Generalized weakness    Cervical / Trunk Assessment Cervical / Trunk Assessment: Kyphotic  Communication   Communication Communication: No apparent difficulties    Cognition Arousal: Alert Behavior During Therapy: WFL for tasks assessed/performed   PT - Cognitive impairments: No apparent impairments                         Following commands: Intact       Cueing Cueing Techniques: Verbal cues     General Comments General comments (skin integrity, edema, etc.): VSS on RA    Exercises     Assessment/Plan    PT Assessment Patient needs continued PT services  PT Problem List Decreased strength;Decreased activity tolerance;Decreased balance;Decreased mobility;Decreased knowledge of use of DME;Pain       PT Treatment Interventions DME instruction;Gait training;Therapeutic activities;Therapeutic exercise;Functional mobility training;Balance training;Neuromuscular re-education;Patient/family education    PT Goals (Current goals can be found in the Care Plan section)  Acute Rehab PT Goals Patient Stated Goal: to improve mobility quality and reduce falls risk PT Goal Formulation: With patient Time For Goal Achievement: 08/21/23 Potential to Achieve Goals: Fair    Frequency Min 2X/week     Co-evaluation               AM-PAC  PT "6 Clicks" Mobility  Outcome Measure Help needed turning from your back to your side while in a flat bed without using bedrails?: A Little Help needed moving from lying on your back to sitting on the side of a flat bed without using bedrails?: A Little Help needed moving to and from a bed to a chair (including a wheelchair)?: A Little Help needed standing up from a chair using your arms (e.g., wheelchair or bedside chair)?: A Little Help needed to walk in hospital room?: Total Help needed climbing 3-5 steps with a railing? : Total 6 Click Score: 14    End of Session   Activity Tolerance: Patient tolerated treatment well Patient left: in chair;with call bell/phone within reach;with chair alarm set Nurse Communication: Mobility status PT Visit Diagnosis: Other abnormalities of gait and mobility (R26.89);Muscle weakness (generalized) (M62.81);History of falling (Z91.81)    Time: 0454-0981 PT Time Calculation (min) (ACUTE ONLY): 22 min   Charges:   PT Evaluation $PT Eval Low Complexity: 1 Low   PT General Charges $$ ACUTE PT VISIT: 1 Visit         Rexie Catena, PT, DPT Acute Rehabilitation Office [phone]  Rexie Catena 08/07/2023, 10:08 AM

## 2023-08-07 NOTE — NC FL2 (Signed)
 [place]  MEDICAID FL2 LEVEL OF CARE FORM     IDENTIFICATION  Patient Name: [name] Birthdate: Oct 22, 1930 Sex: female Admission Date (Current Location): 08/05/2023  [facility] and [place] Number:  Producer, television/film/video and Address:  [facility], [address], [place], [place] [address]      Provider Number: [id number]  Attending Physician Name and Address:  Mickle Albe, MD  Relative Name and Phone Number:  Editha Goring; Daughter; [phone]    Current Level of Care: Hospital Recommended Level of Care: Skilled Nursing Facility Prior Approval Number:    Date Approved/Denied:   PASRR Number: [id number]  Discharge Plan: SNF    Current Diagnoses: Patient Active Problem List   Diagnosis Date Noted   Closed fracture of right distal radius 08/06/2023   Dysphagia 05/14/2023   CHF (congestive heart failure) (HCC) 04/28/2023   CAP (community acquired pneumonia) 04/28/2023   Pressure ulcer 08/10/2022   Tibia/fibula fracture 06/26/2022   Fall at home, initial encounter 06/26/2022   Chronic anticoagulation 06/26/2022   Essential hypertension 06/26/2022   Paresthesia 06/19/2022   PAD (peripheral artery disease) (HCC) 06/19/2022   Foot pain 04/24/2022   GERD (gastroesophageal reflux disease) 12/18/2020   Closed right ankle fracture 09/22/2020   Ankle fracture, right 09/22/2020   Recurrent dislocation of patella, right knee 06/16/2020   Primary osteoarthritis of right knee 02/18/2020   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 12/03/2019   Left knee pain 12/03/2019   Moderate to severe pulmonary hypertension (HCC) 11/26/2019   Tremor of both hands 09/29/2019   Dysuria 08/06/2019   Severe tricuspid regurgitation    S/P hip hemiarthroplasty 08/04/2019   Chronic diarrhea 09/07/2017   Hoarseness of voice 09/07/2017   Total knee replacement status, left 04/25/17 05/06/2017   Primary osteoarthritis of left knee 04/25/2017    DJD (degenerative joint disease) 04/25/2017   Healthcare maintenance 02/08/2017   Vitamin D  deficiency 02/08/2017   Microscopic colitis 09/27/2016   IBS (irritable bowel syndrome) 08/31/2016   At risk for falling 01/07/2016   Cystitis 07/10/2015   Chronic combined systolic and diastolic heart failure (HCC)    Rectus sheath hematoma- no anticoagulation    Atrial fibrillation with RVR- CHADs VASc=4 06/18/2015   Advance care planning 10/23/2014   Leg edema 06/27/2012   Medicare annual wellness visit, subsequent 03/27/2012   Anxiety and depression 03/05/2012   Osteoporosis 05/23/2010   KNEE PAIN, LEFT 03/02/2010   LBBB (left bundle branch block) 10/28/2008   POST MI SEPTAL DEFECT 06/24/2008   Cough 06/24/2008   Nonischemic cardiomyopathy (HCC) 05/30/2008   ATRIAL FIBRILLATION 05/30/2008    Orientation RESPIRATION BLADDER Height & Weight     Self, Situation, Place, Time  Normal (Room Air) Incontinent Weight: 143 lb (64.9 kg) Height:  5\' 5"  (165.1 cm)  BEHAVIORAL SYMPTOMS/MOOD NEUROLOGICAL BOWEL NUTRITION STATUS      Continent Diet (Please see dc summary)  AMBULATORY STATUS COMMUNICATION OF NEEDS Skin   Extensive Assist Verbally Normal                       Personal Care Assistance Level of Assistance  Bathing, Feeding, Dressing Bathing Assistance: Maximum assistance Feeding assistance: Maximum assistance Dressing Assistance: Maximum assistance     Functional Limitations Info  Sight Sight Info: Impaired (R and L (Eyeglasses))        SPECIAL CARE FACTORS FREQUENCY  PT (By licensed PT), OT (By licensed OT)  PT Frequency: 5x OT Frequency: 5x            Contractures Contractures Info: Not present    Additional Factors Info  Code Status, Allergies Code Status Info: DNR PRE-ARREST INTERVENTIONS DESIRED Allergies Info: Ace Inhibitors; Warfarin Sodium; Famotidine ; Vancomycin ; Codeine; Delsym (dextromethorphan Polistirex Er); Lactose Intolerance (GI); Lasix   (furosemide ); Phenylephrine ; Sulfamethoxazole-trimpethoprim           Current Medications (08/07/2023):  This is the current hospital active medication list Current Facility-Administered Medications  Medication Dose Route Frequency Provider Last Rate Last Admin   0.45 % sodium chloride  infusion   Intravenous Continuous Sira, Zackery, MD 75 mL/hr at 08/07/23 0950 New Bag at 08/07/23 0950   acetaminophen  (TYLENOL ) tablet 650 mg  650 mg Oral Q6H PRN Opyd, Timothy S, MD   650 mg at 08/06/23 2148   Or   acetaminophen  (TYLENOL ) suppository 650 mg  650 mg Rectal Q6H PRN Opyd, Timothy S, MD       bisacodyl  (DULCOLAX) EC tablet 5 mg  5 mg Oral Daily PRN Opyd, Timothy S, MD       digoxin  (LANOXIN ) tablet 0.125 mg  0.125 mg Oral Q M,W,F Opyd, Timothy S, MD   0.125 mg at 08/07/23 6962   fentaNYL  (SUBLIMAZE ) injection 12.5-50 mcg  12.5-50 mcg Intravenous Q2H PRN Opyd, Timothy S, MD       metoprolol  succinate (TOPROL -XL) 24 hr tablet 200 mg  200 mg Oral Daily Opyd, Timothy S, MD   200 mg at 08/06/23 1018   ondansetron  (ZOFRAN ) tablet 4 mg  4 mg Oral Q8H PRN Sira, Zackery, MD   4 mg at 08/06/23 1725   oxyCODONE  (Oxy IR/ROXICODONE ) immediate release tablet 2.5-5 mg  2.5-5 mg Oral Q4H PRN Opyd, Timothy S, MD   2.5 mg at 08/07/23 0458   pantoprazole  (PROTONIX ) EC tablet 20 mg  20 mg Oral Daily Opyd, Timothy S, MD   20 mg at 08/07/23 9528   prochlorperazine (COMPAZINE) injection 5 mg  5 mg Intravenous Q6H PRN Opyd, Santana Cue, MD       senna-docusate (Senokot-S) tablet 1 tablet  1 tablet Oral QHS PRN Opyd, Timothy S, MD       sodium chloride  flush (NS) 0.9 % injection 3 mL  3 mL Intravenous Q12H Opyd, Timothy S, MD   3 mL at 08/07/23 0959   venlafaxine  XR (EFFEXOR -XR) 24 hr capsule 75 mg  75 mg Oral q morning Opyd, Timothy S, MD   75 mg at 08/07/23 4132     Discharge Medications: Please see discharge summary for a list of discharge medications.  Relevant Imaging Results:  Relevant Lab  Results:   Additional Information SS#- [id number];  Juliane Och, LCSW

## 2023-08-07 NOTE — Evaluation (Signed)
 Occupational Therapy Evaluation Patient Details Name: [name] MRN: [id number] DOB: 1930-10-22 Today's Date: 08/07/2023   History of Present Illness   Pt is a 89 y.o. female presenting 4/26 with obvious deformity to R wrist following a fall. S/p R arm reduction and splinting 4/27. Plan for surgery 4/30. PMH significant for afib, chronic HFrEF, depression, anxiety, L hemi hip arthroplasty (2021), ORIF tibial plateau 2024.     Clinical Impressions PTA, pt lived with children and was mod I for ADL; received assist for IADL. Upon eval, pt with RUE pain predominantly at dorsal surface of hand just proximal to MCP J. Pt with AROM of all digits, but lacks ~2.5 cm to distal palmar crease with composite flexion, shoulder ROM WFL; elbow/wrist immobilized. Pt needing up to mod A for UB ADL and max A for LB ADL. Pt able to pivot to/from [facility] with min A and cues for safety. Will continue to follow acutely and recommending inpatient rehab <3 hours at discharge.      If plan is discharge home, recommend the following:   A lot of help with walking and/or transfers;A lot of help with bathing/dressing/bathroom;Assistance with cooking/housework;Assist for transportation;Help with stairs or ramp for entrance     Functional Status Assessment   Patient has had a recent decline in their functional status and demonstrates the ability to make significant improvements in function in a reasonable and predictable amount of time.     Equipment Recommendations   Other (comment) (defer to next venue of care)     Recommendations for Other Services         Precautions/Restrictions   Precautions Precautions: Fall Recall of Precautions/Restrictions: Intact Required Braces or Orthoses: Sling Restrictions Weight Bearing Restrictions Per Provider Order: Yes Other Position/Activity Restrictions: no documented weightbearing restrictions noted, presume NWB to R hand/wrist as pt is in sling with wrist  fx     Mobility Bed Mobility Overal bed mobility: Needs Assistance Bed Mobility: Supine to Sit     Supine to sit: Min assist, HOB elevated          Transfers Overall transfer level: Needs assistance Equipment used: 1 person hand held assist Transfers: Sit to/from Stand, Bed to chair/wheelchair/BSC Sit to Stand: Min assist Stand pivot transfers: Min assist         General transfer comment: assist to power up into standing and to facilitate increased anterior weight shift. Pt needing min A as well for eccentric control      Balance Overall balance assessment: Needs assistance Sitting-balance support: No upper extremity supported, Feet supported Sitting balance-Leahy Scale: Good     Standing balance support: Single extremity supported, Reliant on assistive device for balance Standing balance-Leahy Scale: Poor                             ADL either performed or assessed with clinical judgement   ADL Overall ADL's : Needs assistance/impaired Eating/Feeding: Set up;Sitting   Grooming: Wash/dry face;Sitting;Set up   Upper Body Bathing: Moderate assistance;Sitting   Lower Body Bathing: Maximal assistance;Sit to/from stand   Upper Body Dressing : Moderate assistance;Sitting   Lower Body Dressing: Maximal assistance;Sit to/from stand   Toilet Transfer: Minimal assistance;Stand-pivot;Cueing for safety;Cueing for sequencing;BSC/3in1   Toileting- Clothing Manipulation and Hygiene: Total assistance;Sit to/from stand       Functional mobility during ADLs: Minimal assistance       Vision Patient Visual Report: No change from baseline  Perception         Praxis         Pertinent Vitals/Pain Pain Assessment Pain Assessment: Faces Faces Pain Scale: Hurts little more Pain Location: RUE Pain Descriptors / Indicators: Grimacing Pain Intervention(s): Limited activity within patient's tolerance, Monitored during session     Extremity/Trunk  Assessment Upper Extremity Assessment Upper Extremity Assessment: RUE deficits/detail RUE Deficits / Details: pt in sling, able to wiggle all digits, noting increased edema in first digit/lateral aspect of hand. pt lacks ~2.5 CM from distal palmar crease in attempts to make fist. Elbow/wrist immobilized. shoulder flexion WFL for BADL. denies numbness and tingling   Lower Extremity Assessment Lower Extremity Assessment: Generalized weakness   Cervical / Trunk Assessment Cervical / Trunk Assessment: Kyphotic   Communication Communication Communication: No apparent difficulties   Cognition Arousal: Alert Behavior During Therapy: WFL for tasks assessed/performed Cognition: Cognition impaired     Awareness: Online awareness impaired Memory impairment (select all impairments): Short-term memory, Working memory Attention impairment (select first level of impairment): Selective attention Executive functioning impairment (select all impairments): Sequencing, Organization, Reasoning, Problem solving                   Following commands: Intact       Cueing  General Comments   Cueing Techniques: Verbal cues  VSS on RA   Exercises     Shoulder Instructions      Home Living Family/patient expects to be discharged to:: Private residence Living Arrangements: Children Available Help at Discharge: Family;Available 24 hours/day Type of Home: House Home Access: Stairs to enter;Ramped entrance Entrance Stairs-Number of Steps: typically uses ramp at front door   Home Layout: One level     Bathroom Shower/Tub: Producer, television/film/video: Handicapped height Bathroom Accessibility: Yes   Home Equipment: Agricultural consultant (2 wheels);Wheelchair - manual;Transport chair;BSC/3in1;Grab bars - toilet;Grab bars - tub/shower;Hand held shower head;Shower seat;Lift chair          Prior Functioning/Environment Prior Level of Function : Independent/Modified Independent;Needs  assist             Mobility Comments: ambulatory with support of RW, pt reports he last fall was in 2024, does have a history of multiple falls with injury through the years ADLs Comments: At baseline, pt is Independent to Mod I with ADLs. Family assists with IADLs    OT Problem List: Decreased strength;Decreased activity tolerance;Impaired balance (sitting and/or standing);Decreased range of motion;Decreased cognition;Decreased safety awareness;Decreased knowledge of use of DME or AE;Decreased knowledge of precautions;Impaired UE functional use;Pain   OT Treatment/Interventions: Self-care/ADL training;Therapeutic exercise;DME and/or AE instruction;Patient/family education;Balance training;Therapeutic activities;Cognitive remediation/compensation      OT Goals(Current goals can be found in the care plan section)   Acute Rehab OT Goals Patient Stated Goal: go home OT Goal Formulation: With patient Time For Goal Achievement: 08/21/23 Potential to Achieve Goals: Good   OT Frequency:  Min 1X/week    Co-evaluation              AM-PAC OT "6 Clicks" Daily Activity     Outcome Measure Help from another person eating meals?: A Little Help from another person taking care of personal grooming?: A Little Help from another person toileting, which includes using toliet, bedpan, or urinal?: A Lot Help from another person bathing (including washing, rinsing, drying)?: A Lot Help from another person to put on and taking off regular upper body clothing?: A Lot Help from another person to put on and taking off  regular lower body clothing?: A Lot 6 Click Score: 14   End of Session Equipment Utilized During Treatment: Gait belt;Other (comment) (sling to RUE) Nurse Communication: Mobility status  Activity Tolerance: Patient tolerated treatment well Patient left: in chair;with call bell/phone within reach;with chair alarm set;with restraints reapplied  OT Visit Diagnosis: Unsteadiness on  feet (R26.81);Muscle weakness (generalized) (M62.81);History of falling (Z91.81);Other symptoms and signs involving cognitive function;Pain Pain - Right/Left: Right Pain - part of body: Hand                Time: 1234-1300 OT Time Calculation (min): 26 min Charges:  OT General Charges $OT Visit: 1 Visit OT Evaluation $OT Eval Low Complexity: 1 Low OT Treatments $Self Care/Home Management : 8-22 mins  Karilyn Ouch, OTR/L [facility] Acute Rehabilitation Office: [phone]   [name] 08/07/2023, 2:12 PM

## 2023-08-07 NOTE — TOC CAGE-AID Note (Signed)
 Transition of Care [facility]) - CAGE-AID Screening   Patient Details  Name: [name] MRN: [id number] Date of Birth: 10/20/1930  Transition of Care [facility]) CM/SW Contact:    Yaxiel Minnie E Lovis More, LCSW Phone Number: 08/07/2023, 11:52 AM   Clinical Narrative: Patient declined alcohol  and substance use.   CAGE-AID Screening:    Have You Ever Felt You Ought to Cut Down on Your Drinking or Drug Use?: No Have People Annoyed You By Critizing Your Drinking Or Drug Use?: No Have You Felt Bad Or Guilty About Your Drinking Or Drug Use?: No Have You Ever Had a Drink or Used Drugs First Thing In The Morning to Steady Your Nerves or to Get Rid of a Hangover?: No CAGE-AID Score: 0  Substance Abuse Education Offered: No

## 2023-08-07 NOTE — TOC Initial Note (Addendum)
 Transition of Care [facility]) - Initial/Assessment Note    Patient Details  Name: [name] MRN: [id number] Date of Birth: 11-23-1930  Transition of Care [facility]) CM/SW Contact:    Juliane Och, LCSW Phone Number: 08/07/2023, 12:28 PM  Clinical Narrative:                  12:28 PM Per hospitalist, patient and family are agreeable to patient discharging to SNF (preference in [facility]).  3:10 PM CSW informed patient at bedside of [facility] bed offer. Patient accepted bed offer. CSW informed SNF of acceptance. Patient inquired about transportation for discharge. CSW offered to coordinate ambulance transportation and made patient aware of possible copay to be billed upon service. Patient expressed understanding and continued interest in ambulance transportation for discharge. Insurance authorization to be completed upon patient's surgery scheduled for Wednesday.   Expected Discharge Plan: Skilled Nursing Facility Barriers to Discharge: Continued Medical Work up, SNF Pending bed offer   Patient Goals and CMS Choice Patient states their goals for this hospitalization and ongoing recovery are:: SNF          Expected Discharge Plan and Services In-house Referral: Clinical Social Work   Post Acute Care Choice: Skilled Nursing Facility Living arrangements for the past 2 months: Single Family Home                                      Prior Living Arrangements/Services Living arrangements for the past 2 months: Single Family Home Lives with:: Adult Children Patient language and need for interpreter reviewed:: Yes        Need for Family Participation in Patient Care: No (Comment) Care giver support system in place?: Yes (comment)   Criminal Activity/Legal Involvement Pertinent to Current Situation/Hospitalization: No - Comment as needed  Activities of Daily Living   ADL Screening (condition at time of admission) Independently performs ADLs?: Yes (appropriate for  developmental age) Is the patient deaf or have difficulty hearing?: No Does the patient have difficulty seeing, even when wearing glasses/contacts?: No Does the patient have difficulty concentrating, remembering, or making decisions?: No  Permission Sought/Granted Permission sought to share information with : Family Supports, Oceanographer granted to share information with : No (Contact information on chart)  Share Information with NAME: Editha Goring  Permission granted to share info w AGENCY: SNF  Permission granted to share info w Relationship: Daughter  Permission granted to share info w Contact Information: [phone]  Emotional Assessment       Orientation: : Oriented to Self, Oriented to Place, Oriented to  Time, Oriented to Situation Alcohol  / Substance Use: Not Applicable Psych Involvement: No (comment)  Admission diagnosis:  Closed fracture of right distal radius [S52.501A] Fall, initial encounter [W19.XXXA] Closed fracture of right wrist, initial encounter [S62.101A] Patient Active Problem List   Diagnosis Date Noted   Closed fracture of right distal radius 08/06/2023   Dysphagia 05/14/2023   CHF (congestive heart failure) (HCC) 04/28/2023   CAP (community acquired pneumonia) 04/28/2023   Pressure ulcer 08/10/2022   Tibia/fibula fracture 06/26/2022   Fall at home, initial encounter 06/26/2022   Chronic anticoagulation 06/26/2022   Essential hypertension 06/26/2022   Paresthesia 06/19/2022   PAD (peripheral artery disease) (HCC) 06/19/2022   Foot pain 04/24/2022   GERD (gastroesophageal reflux disease) 12/18/2020   Closed right ankle fracture 09/22/2020   Ankle fracture, right 09/22/2020  Recurrent dislocation of patella, right knee 06/16/2020   Primary osteoarthritis of right knee 02/18/2020   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 12/03/2019   Left knee pain 12/03/2019   Moderate to severe pulmonary  hypertension (HCC) 11/26/2019   Tremor of both hands 09/29/2019   Dysuria 08/06/2019   Severe tricuspid regurgitation    S/P hip hemiarthroplasty 08/04/2019   Chronic diarrhea 09/07/2017   Hoarseness of voice 09/07/2017   Total knee replacement status, left 04/25/17 05/06/2017   Primary osteoarthritis of left knee 04/25/2017   DJD (degenerative joint disease) 04/25/2017   Healthcare maintenance 02/08/2017   Vitamin D  deficiency 02/08/2017   Microscopic colitis 09/27/2016   IBS (irritable bowel syndrome) 08/31/2016   At risk for falling 01/07/2016   Cystitis 07/10/2015   Chronic combined systolic and diastolic heart failure (HCC)    Rectus sheath hematoma- no anticoagulation    Atrial fibrillation with RVR- CHADs VASc=4 06/18/2015   Advance care planning 10/23/2014   Leg edema 06/27/2012   Medicare annual wellness visit, subsequent 03/27/2012   Anxiety and depression 03/05/2012   Osteoporosis 05/23/2010   KNEE PAIN, LEFT 03/02/2010   LBBB (left bundle branch block) 10/28/2008   POST MI SEPTAL DEFECT 06/24/2008   Cough 06/24/2008   Nonischemic cardiomyopathy (HCC) 05/30/2008   ATRIAL FIBRILLATION 05/30/2008   PCP:  Donnie Galea, MD Pharmacy:   [facility] [phone] - [address], [place] - [address] [place] [address] Phone: [phone] Fax: [phone]     Social Drivers of Health (SDOH) Social History: SDOH Screenings   Food Insecurity: No Food Insecurity (08/06/2023)  Housing: Low Risk  (08/06/2023)  Transportation Needs: No Transportation Needs (08/06/2023)  Utilities: Not At Risk (08/06/2023)  Alcohol  Screen: Low Risk  (08/11/2022)  Depression (PHQ2-9): High Risk (05/12/2023)  Financial Resource Strain: Low Risk  (07/12/2021)  Physical Activity: Sufficiently Active (08/11/2022)  Social Connections: Moderately Integrated (08/06/2023)  Stress: No Stress Concern Present (08/11/2022)  Tobacco Use: Low Risk  (08/06/2023)   SDOH Interventions:      Readmission Risk Interventions     No data to display

## 2023-08-07 NOTE — Progress Notes (Signed)
   08/07/23 0957  Assess: MEWS Score  BP 99/67  Pulse Rate 100  Level of Consciousness Alert  Assess: MEWS Score  MEWS Temp 0  MEWS Systolic 1  MEWS Pulse 0  MEWS RR 0  MEWS LOC 0  MEWS Score 1  MEWS Score Color Green  Assess: if the MEWS score is Yellow or Red  Were vital signs accurate and taken at a resting state? Yes  Does the patient meet 2 or more of the SIRS criteria? No  Notify: Charge Nurse/RN  Name of Charge Nurse/RN Notified Counselling psychologist  Assess: SIRS CRITERIA  SIRS Temperature  0  SIRS Respirations  0  SIRS Pulse 1  SIRS WBC 0  SIRS Score Sum  1   Pt HR-100 per tele box, no changes in LOC, pt has history of A-fib. See above chart.

## 2023-08-07 NOTE — Progress Notes (Signed)
  Progress Note   Patient: [name] BJY:[id number] DOB: October 21, 1930 DOA: 08/05/2023     1 DOS: the patient was seen and examined on 08/07/2023    Assessment and Plan: R Distal radius fracture  - Orthopedic surgery planned for Wed 08/09/2023 - Tylenol  PRN  - Oxycodone  PRN  - Fentanyl  PRN - Senokot-S 1 tab PRN daily   A-fib - Eliquis  on hold due to surgery on Wed as above  - Digoxin  0.125 mg PO MWF  Chronic HFrEF  - Toprol  XL 200 mg PO daily   Depression/anxiety  - Effexor -XR 75 mg PO daily   5. Hypovolemia  - IV 1/2 NS 75 cc/hr   Subjective: Pt seen and examined at the bedside. BP touch low this morning and pt intermittently tachycardic.  IV fluids ordered. Pt aware for plans of surgery on Wed with Dr. Annamae Barrett.   SW working on [employer] (so that pt can discharge there after her surgery).  Physical Exam: Vitals:   08/07/23 0454 08/07/23 0730 08/07/23 0956 08/07/23 0957  BP: (!) 102/58 106/76  99/67  Pulse: 78 97 (!) 104 100  Resp: 18 16    Temp: 98.3 F (36.8 C) 98.1 F (36.7 C)    TempSrc: Oral Oral    SpO2: 92% 93%    Weight:      Height:       Physical Exam HENT:     Head: Normocephalic.     Mouth/Throat:     Mouth: Mucous membranes are moist.  Cardiovascular:     Rate and Rhythm: Normal rate.  Pulmonary:     Effort: Pulmonary effort is normal.  Abdominal:     Palpations: Abdomen is soft.  Musculoskeletal:     Cervical back: Neck supple.     Comments: Decreased ROM R hand   Skin:    General: Skin is warm.  Neurological:     Mental Status: She is alert. Mental status is at baseline.  Psychiatric:        Mood and Affect: Mood normal.       Disposition: Status is: Inpatient Remains inpatient appropriate because: IV fluids and orthopedic surgery on Wed 08/09/2023  Planned Discharge Destination: Skilled nursing facility    Time spent: 35 minutes  Author: Mieke Brinley , MD 08/07/2023 12:42 PM  For on call review [email or web address].

## 2023-08-08 DIAGNOSIS — S52571A Other intraarticular fracture of lower end of right radius, initial encounter for closed fracture: Secondary | ICD-10-CM | POA: Diagnosis not present

## 2023-08-08 LAB — CBC
HCT: 31.6 % — ABNORMAL LOW (ref 36.0–46.0)
Hemoglobin: 10.4 g/dL — ABNORMAL LOW (ref 12.0–15.0)
MCH: 31.6 pg (ref 26.0–34.0)
MCHC: 32.9 g/dL (ref 30.0–36.0)
MCV: 96 fL (ref 80.0–100.0)
Platelets: 135 10*3/uL — ABNORMAL LOW (ref 150–400)
RBC: 3.29 MIL/uL — ABNORMAL LOW (ref 3.87–5.11)
RDW: 12.8 % (ref 11.5–15.5)
WBC: 8.6 10*3/uL (ref 4.0–10.5)
nRBC: 0 % (ref 0.0–0.2)

## 2023-08-08 LAB — BASIC METABOLIC PANEL WITH GFR
Anion gap: 10 (ref 5–15)
BUN: 24 mg/dL — ABNORMAL HIGH (ref 8–23)
CO2: 28 mmol/L (ref 22–32)
Calcium: 8.7 mg/dL — ABNORMAL LOW (ref 8.9–10.3)
Chloride: 97 mmol/L — ABNORMAL LOW (ref 98–111)
Creatinine, Ser: 0.76 mg/dL (ref 0.44–1.00)
GFR, Estimated: >60 mL/min (ref >60)
Glucose, Bld: 106 mg/dL — ABNORMAL HIGH (ref 70–99)
Potassium: 3.6 mmol/L (ref 3.5–5.1)
Sodium: 135 mmol/L (ref 135–145)

## 2023-08-08 LAB — MAGNESIUM: Magnesium: 2 mg/dL (ref 1.7–2.4)

## 2023-08-08 NOTE — Plan of Care (Signed)
  Problem: Clinical Measurements: Goal: Diagnostic test results will improve Outcome: Progressing Goal: Respiratory complications will improve Outcome: Progressing   Problem: Nutrition: Goal: Adequate nutrition will be maintained Outcome: Not Progressing   Problem: Coping: Goal: Level of anxiety will decrease Outcome: Not Progressing

## 2023-08-08 NOTE — Progress Notes (Signed)
  Progress Note   Patient: [name] ZOX:[id number] DOB: Apr 08, 1931 DOA: 08/05/2023     2 DOS: the patient was seen and examined on 08/08/2023    Assessment and Plan: R Distal radius fracture  - Orthopedic surgery planned for Wed 08/09/2023 - Tylenol  PRN  - Oxycodone  PRN  - Fentanyl  PRN - Senokot-S 1 tab PRN daily    A-fib - Eliquis  on hold due to surgery on Wed as above  - Digoxin  0.125 mg PO MWF   Chronic HFrEF  - Toprol  XL 200 mg PO daily    Depression/anxiety  - Effexor -XR 75 mg PO daily    5. Hypovolemia  - Resolved; IV fluids stopped   Subjective: Pt seen and examined at the bedside. Kidney function improved (BUN nearly back to wnl) s/p IV fluids. BP also improved. IV fluids stopped today.  Pt is NPO at midnight for orthopedic surgery tmr. FL2 signed as the plan is for the pt to go to SNF after the surgery is completed.  Physical Exam: Vitals:   08/07/23 1550 08/07/23 1938 08/08/23 0505 08/08/23 0900  BP: (!) 101/58 102/63 112/60 105/65  Pulse:  85 68 90  Resp: 16 18 18 17   Temp: (!) 97.3 F (36.3 C) 98.5 F (36.9 C) 98.5 F (36.9 C) 98.2 F (36.8 C)  TempSrc: Oral Oral Oral Oral  SpO2: 91% 97% 90% 94%  Weight:      Height:       HENT:     Head: Normocephalic.     Mouth/Throat:     Mouth: Mucous membranes are moist.  Cardiovascular:     Rate and Rhythm: Normal rate.  Pulmonary:     Effort: Pulmonary effort is normal.  Abdominal:     Palpations: Abdomen is soft.  Musculoskeletal:     Cervical back: Neck supple.     Comments: Decreased ROM R hand   Skin:    General: Skin is warm.  Neurological:     Mental Status: She is alert. Mental status is at baseline.  Psychiatric:        Mood and Affect: Mood normal.      Disposition: Status is: Inpatient Remains inpatient appropriate because: Hand surgery on 08/09/2023  Planned Discharge Destination: Skilled nursing facility    Time spent: 35 minutes  Author: Chaka Jefferys , MD 08/08/2023 9:57  AM  For on call review [email or web address].

## 2023-08-08 NOTE — Progress Notes (Addendum)
 Mobility Specialist Progress Note:   08/08/23 1206  Mobility  Activity Transferred to/from [facility]  Level of Assistance Moderate assist, patient does 50-74%  Assistive Device  (HHA)  Distance Ambulated (ft) 2 ft  Activity Response Tolerated fair  Mobility Referral Yes  Mobility visit 1 Mobility  Mobility Specialist Start Time (ACUTE ONLY) 0915  Mobility Specialist Stop Time (ACUTE ONLY) 0930  Mobility Specialist Time Calculation (min) (ACUTE ONLY) 15 min   Pt received in bed, requesting assistance to [facility] for BM. Sling donned for RUE before mobility. Pt was able to come to EOB with MinA. ModA HHA to stand and pivot to [facility]. Pt fearful of falling taking small shuffles during transfer. No knee buckling present during transfer. Pt left on BSC with NT/RN present in room.   Sofia Dunn  Mobility Specialist Please contact via [employer] office at [phone]

## 2023-08-09 ENCOUNTER — Inpatient Hospital Stay (HOSPITAL_COMMUNITY): Admitting: Certified Registered"

## 2023-08-09 ENCOUNTER — Other Ambulatory Visit: Payer: Self-pay

## 2023-08-09 ENCOUNTER — Inpatient Hospital Stay (HOSPITAL_COMMUNITY)

## 2023-08-09 ENCOUNTER — Encounter (HOSPITAL_COMMUNITY): Payer: Self-pay | Admitting: Family Medicine

## 2023-08-09 ENCOUNTER — Encounter (HOSPITAL_COMMUNITY)
Admission: EM | Disposition: A | Discharge status: Skilled Nursing Facility | Payer: Self-pay | Source: Home / Self Care | Attending: Internal Medicine

## 2023-08-09 DIAGNOSIS — I4891 Unspecified atrial fibrillation: Secondary | ICD-10-CM

## 2023-08-09 DIAGNOSIS — F419 Anxiety disorder, unspecified: Secondary | ICD-10-CM | POA: Diagnosis not present

## 2023-08-09 DIAGNOSIS — S52571A Other intraarticular fracture of lower end of right radius, initial encounter for closed fracture: Secondary | ICD-10-CM | POA: Diagnosis not present

## 2023-08-09 DIAGNOSIS — I5042 Chronic combined systolic (congestive) and diastolic (congestive) heart failure: Secondary | ICD-10-CM | POA: Diagnosis not present

## 2023-08-09 DIAGNOSIS — I110 Hypertensive heart disease with heart failure: Secondary | ICD-10-CM

## 2023-08-09 DIAGNOSIS — I509 Heart failure, unspecified: Secondary | ICD-10-CM

## 2023-08-09 DIAGNOSIS — S62101A Fracture of unspecified carpal bone, right wrist, initial encounter for closed fracture: Secondary | ICD-10-CM

## 2023-08-09 DIAGNOSIS — I272 Pulmonary hypertension, unspecified: Secondary | ICD-10-CM

## 2023-08-09 HISTORY — PX: OPEN REDUCTION INTERNAL FIXATION (ORIF) DISTAL RADIAL FRACTURE: SHX5989

## 2023-08-09 LAB — CBC
HCT: 31.6 % — ABNORMAL LOW (ref 36.0–46.0)
Hemoglobin: 10.7 g/dL — ABNORMAL LOW (ref 12.0–15.0)
MCH: 32.4 pg (ref 26.0–34.0)
MCHC: 33.9 g/dL (ref 30.0–36.0)
MCV: 95.8 fL (ref 80.0–100.0)
Platelets: 151 10*3/uL (ref 150–400)
RBC: 3.3 MIL/uL — ABNORMAL LOW (ref 3.87–5.11)
RDW: 12.8 % (ref 11.5–15.5)
WBC: 9.8 10*3/uL (ref 4.0–10.5)
nRBC: 0 % (ref 0.0–0.2)

## 2023-08-09 LAB — BASIC METABOLIC PANEL WITH GFR
Anion gap: 11 (ref 5–15)
BUN: 16 mg/dL (ref 8–23)
CO2: 27 mmol/L (ref 22–32)
Calcium: 9 mg/dL (ref 8.9–10.3)
Chloride: 99 mmol/L (ref 98–111)
Creatinine, Ser: 0.66 mg/dL (ref 0.44–1.00)
GFR, Estimated: >60 mL/min (ref >60)
Glucose, Bld: 104 mg/dL — ABNORMAL HIGH (ref 70–99)
Potassium: 3.6 mmol/L (ref 3.5–5.1)
Sodium: 137 mmol/L (ref 135–145)

## 2023-08-09 LAB — MAGNESIUM: Magnesium: 1.9 mg/dL (ref 1.7–2.4)

## 2023-08-09 LAB — SURGICAL PCR SCREEN
MRSA, PCR: NEGATIVE
Staphylococcus aureus: POSITIVE — AB

## 2023-08-09 LAB — PROTIME-INR
INR: 1.2 (ref 0.8–1.2)
Prothrombin Time: 15 s (ref 11.4–15.2)

## 2023-08-09 SURGERY — OPEN REDUCTION INTERNAL FIXATION (ORIF) DISTAL RADIUS FRACTURE
Anesthesia: Regional | Laterality: Right

## 2023-08-09 MED ORDER — BUPIVACAINE HCL (PF) 0.25 % IJ SOLN
INTRAMUSCULAR | Status: AC
  Filled 2023-08-09: qty 10

## 2023-08-09 MED ORDER — PROPOFOL 10 MG/ML IV BOLUS
INTRAVENOUS | Status: AC
Start: 2023-08-09 — End: ?
  Filled 2023-08-09: qty 20

## 2023-08-09 MED ORDER — LACTATED RINGERS IV SOLN: INTRAVENOUS | Status: DC

## 2023-08-09 MED ORDER — ROPIVACAINE HCL 5 MG/ML IJ SOLN
INTRAMUSCULAR | Status: DC | PRN
  Administered 2023-08-09: 30 mL via PERINEURAL

## 2023-08-09 MED ORDER — POVIDONE-IODINE 7.5 % EX SOLN
Freq: Once | CUTANEOUS | Status: AC
  Filled 2023-08-09: qty 118

## 2023-08-09 MED ORDER — ORAL CARE MOUTH RINSE
15.0000 mL | Freq: Once | OROMUCOSAL | Status: AC
  Administered 2023-08-09: 15 mL via OROMUCOSAL

## 2023-08-09 MED ORDER — SODIUM CHLORIDE 0.9 % IR SOLN
Status: DC | PRN
  Administered 2023-08-09: 1000 mL

## 2023-08-09 MED ORDER — POTASSIUM CHLORIDE 10 MEQ/100ML IV SOLN
10.0000 meq | INTRAVENOUS | Status: DC
  Administered 2023-08-09: 10 meq via INTRAVENOUS
  Filled 2023-08-09: qty 100

## 2023-08-09 MED ORDER — CHLORHEXIDINE GLUCONATE 0.12 % MT SOLN
15.0000 mL | Freq: Once | OROMUCOSAL | Status: AC
  Filled 2023-08-09: qty 15

## 2023-08-09 MED ORDER — PROPOFOL 500 MG/50ML IV EMUL
INTRAVENOUS | Status: DC | PRN
Start: 2023-08-09 — End: 2023-08-09
  Administered 2023-08-09: 80 ug/kg/min via INTRAVENOUS

## 2023-08-09 MED ORDER — EPHEDRINE SULFATE-NACL 50-0.9 MG/10ML-% IV SOSY
PREFILLED_SYRINGE | INTRAVENOUS | Status: DC | PRN
  Administered 2023-08-09: 5 mg via INTRAVENOUS

## 2023-08-09 MED ORDER — FENTANYL CITRATE (PF) 100 MCG/2ML IJ SOLN: 25.0000 ug | Freq: Once | INTRAMUSCULAR | Status: AC

## 2023-08-09 MED ORDER — PROPOFOL 10 MG/ML IV BOLUS
INTRAVENOUS | Status: DC | PRN
  Administered 2023-08-09: 20 mg via INTRAVENOUS

## 2023-08-09 MED ORDER — CEFAZOLIN SODIUM-DEXTROSE 2-4 GM/100ML-% IV SOLN
2.0000 g | INTRAVENOUS | Status: AC
  Administered 2023-08-09: 2 g via INTRAVENOUS
  Filled 2023-08-09: qty 100

## 2023-08-09 MED ORDER — TRAMADOL HCL 50 MG PO TABS
50.0000 mg | ORAL_TABLET | Freq: Two times a day (BID) | ORAL | Status: DC | PRN
  Administered 2023-08-09 – 2023-08-10 (×2): 50 mg via ORAL
  Filled 2023-08-09 (×2): qty 1

## 2023-08-09 MED ORDER — FENTANYL CITRATE (PF) 100 MCG/2ML IJ SOLN
INTRAMUSCULAR | Status: AC
  Administered 2023-08-09: 25 ug via INTRAVENOUS
  Filled 2023-08-09: qty 2

## 2023-08-09 SURGICAL SUPPLY — 53 items
BAG COUNTER SPONGE SURGICOUNT (BAG) ×1 IMPLANT
BIT DRILL 2.2 SS TIBIAL (BIT) IMPLANT
BNDG ELASTIC 3INX 5YD STR LF (GAUZE/BANDAGES/DRESSINGS) ×1 IMPLANT
BNDG ELASTIC 4X5.8 VLCR STR LF (GAUZE/BANDAGES/DRESSINGS) ×1 IMPLANT
BNDG ESMARK 4X9 LF (GAUZE/BANDAGES/DRESSINGS) ×1 IMPLANT
BNDG GAUZE DERMACEA FLUFF 4 (GAUZE/BANDAGES/DRESSINGS) ×1 IMPLANT
CANISTER SUCT 3000ML PPV (MISCELLANEOUS) ×1 IMPLANT
CORD BIPOLAR FORCEPS 12FT (ELECTRODE) ×1 IMPLANT
COVER SURGICAL LIGHT HANDLE (MISCELLANEOUS) ×1 IMPLANT
CUFF TOURN SGL QUICK 18X4 (TOURNIQUET CUFF) ×1 IMPLANT
CUFF TRNQT CYL 24X4X16.5-23 (TOURNIQUET CUFF) IMPLANT
DRAPE OEC MINIVIEW 54X84 (DRAPES) ×1 IMPLANT
DRAPE SURG 17X11 SM STRL (DRAPES) ×1 IMPLANT
DRSG ADAPTIC 3X8 NADH LF (GAUZE/BANDAGES/DRESSINGS) ×1 IMPLANT
GAUZE 4X4 16PLY ~~LOC~~+RFID DBL (SPONGE) ×1 IMPLANT
GAUZE SPONGE 4X4 12PLY STRL (GAUZE/BANDAGES/DRESSINGS) ×1 IMPLANT
GAUZE XEROFORM 5X9 LF (GAUZE/BANDAGES/DRESSINGS) ×1 IMPLANT
GLOVE BIOGEL PI IND STRL 8.5 (GLOVE) ×1 IMPLANT
GLOVE SURG ORTHO 8.0 STRL STRW (GLOVE) ×1 IMPLANT
GOWN STRL REUS W/ TWL LRG LVL3 (GOWN DISPOSABLE) ×1 IMPLANT
GOWN STRL REUS W/ TWL XL LVL3 (GOWN DISPOSABLE) ×1 IMPLANT
KIT BASIN OR (CUSTOM PROCEDURE TRAY) ×1 IMPLANT
KIT TURNOVER KIT B (KITS) ×1 IMPLANT
KWIRE FX5X1.6XNS BN SS (WIRE) IMPLANT
NDL HYPO 25X1 1.5 SAFETY (NEEDLE) ×1 IMPLANT
NEEDLE HYPO 25X1 1.5 SAFETY (NEEDLE) ×1 IMPLANT
NS IRRIG 1000ML POUR BTL (IV SOLUTION) ×1 IMPLANT
PACK ORTHO EXTREMITY (CUSTOM PROCEDURE TRAY) ×1 IMPLANT
PAD ARMBOARD POSITIONER FOAM (MISCELLANEOUS) ×2 IMPLANT
PAD CAST 3X4 CTTN HI CHSV (CAST SUPPLIES) IMPLANT
PAD CAST 4YDX4 CTTN HI CHSV (CAST SUPPLIES) ×1 IMPLANT
PEG LOCKING SMOOTH 2.2X16 (Screw) IMPLANT
PEG LOCKING SMOOTH 2.2X20 (Screw) IMPLANT
PEG LOCKING SMOOTH 2.2X22 (Screw) IMPLANT
PEG LOCKING SMOOTH 2.2X24 (Peg) IMPLANT
PLATE DVR CROSSLOCK STD RT (Plate) IMPLANT
SCREW LOCK 14X2.7X 3 LD TPR (Screw) IMPLANT
SCREW LOCK 18X2.7X 3 LD TPR (Screw) IMPLANT
SCREW LOCKING 2.7X15MM (Screw) IMPLANT
SCREW MULTI DIRECTIONAL 2.7X16 (Screw) IMPLANT
SLING ARM FOAM STRAP LRG (SOFTGOODS) IMPLANT
SOAP 2 % CHG 4 OZ (WOUND CARE) ×1 IMPLANT
SPIKE FLUID TRANSFER (MISCELLANEOUS) ×1 IMPLANT
SPLINT FIBERGLASS 3X35 (CAST SUPPLIES) IMPLANT
SPONGE T-LAP 4X18 ~~LOC~~+RFID (SPONGE) ×1 IMPLANT
SUT MNCRL AB 3-0 PS2 18 (SUTURE) IMPLANT
SUT VIC AB 2-0 CT2 27 (SUTURE) IMPLANT
SUT VICRYL RAPIDE 4/0 PS 2 (SUTURE) IMPLANT
TOWEL GREEN STERILE (TOWEL DISPOSABLE) ×1 IMPLANT
TOWEL GREEN STERILE FF (TOWEL DISPOSABLE) IMPLANT
TUBE CONNECTING 12X1/4 (SUCTIONS) ×1 IMPLANT
WATER STERILE IRR 1000ML POUR (IV SOLUTION) ×1 IMPLANT
YANKAUER SUCT BULB TIP NO VENT (SUCTIONS) IMPLANT

## 2023-08-09 NOTE — Progress Notes (Signed)
 PROGRESS NOTE    [name]  WUJ:[id number] DOB: [birth year] DOA: 08/05/2023 PCP: Donnie Galea, MD   Brief Narrative:  The patient [name] is a pleasant 89 y.o. female with medical history significant for atrial fibrillation on Eliquis , chronic HFrEF, depression, and anxiety, now presenting with right wrist injury after a fall. Found to have a R distal radius fracture and Orthopedic Surgery planning Surgical intervention 08/09/23. Last dose of Eliquis  was 08/05/23. Hospitalization has been complicated by some Delirium and A Fib with RVR  Assessment and Plan:  R Distal Radius Fracture:  Orthopedic surgery planned for Wed 08/09/2023, C/w Acetaminophen  650 mg po/RC q6hprn Mild Pain. D/C Oxycodone  PRN and add Tramadol  50 mg q12hprn Severe Pain and C/w IV Fentanyl  12.5-50 mcg IV q2hprn. C/w Bowel regimen with Bisacodyl  5 mg po dailyprn Moderate Constipation and C/w Senna-Docusate 1 tab PRN daily    A-Fib w/ RVR:  Eliquis  on hold due to surgery on Wed as above. Was in A Fib w/ RVR with HR in the 120's. Not received any of her AM Meds yet. C/w Metoprolol  Succinate 200 mg po Daily and Digoxin  0.125 mg po MWF. CTM on Telemetry and Replace Electrolytes.   Hallucinations: Likely in the setting of Opiates so will need judicious use of Narcotics. Stop Oxycodone  PRN and start Tramadol . Delirium Precautions. If persists will consider Psychiatry consultation.    Chronic HFrEF: C/w Metoprolol  Succuniate 200 mg po daily. IVF now stopped. CTM for S/Sx of Volume Overload.     Depression/Anxiety: C/w Venlafaxine  XR 75 mg po dauly    Hypovolemia: Resolved; IV fluids stopped   Normocytic Anemia: Hgb/Hct went from 12.9/38.0 -> 11.8/36.0 -> 10.4/31.6 -> 10.7/31.6. Check Anemia Panel in the AM. CTM for S/Sx of Bleeding; No overt bleeding noted. PT/INR is now 15.0/1.2. CTM and Trend and repeat CBC in the AM  GERD/GI Prophylaxis: C/w Pantoprazole  20 mg po Daily     DVT prophylaxis: SCDs Start: 08/06/23  0405    Code Status: Do not attempt resuscitation (DNR) PRE-ARREST INTERVENTIONS DESIRED Family Communication: No family present at bedside  Disposition Plan:  Level of care: Telemetry Medical Status is: Inpatient Remains inpatient appropriate because: Undergoing Surgical Intervention today   Consultants:  Orthopedic Surgery  Procedures:  As delineated as above   Antimicrobials:  Anti-infectives (From admission, onward)    Start     Dose/Rate Route Frequency Ordered Stop   08/09/23 1100  ceFAZolin  (ANCEF ) IVPB 2g/100 mL premix        2 g 200 mL/hr over 30 Minutes Intravenous On call to O.R. 08/09/23 1012 08/09/23 1202       Subjective: Seen and examined at bedside and was anxious after she was having "a nightmare".  Nursing states that she started hallucinating a little bit as well and the thought is it is related to her narcotics.  No nausea or vomiting.  Thinks the swelling in her wrist is coming down.  Continues to have some pain.  No other concerns or complaints at this time.  Objective: Vitals:   08/08/23 1951 08/09/23 0414 08/09/23 0736 08/09/23 0955  BP: 102/87 111/72 (!) 108/59 (!) 108/59  Pulse: 78 98 (!) 106 (!) 106  Resp: 20 20 16    Temp: 98.6 F (37 C) 98.2 F (36.8 C) (!) 97.5 F (36.4 C)   TempSrc: Oral  Oral   SpO2: 95% 94% 93%   Weight:      Height:        Intake/Output  Summary (Last 24 hours) at 08/09/2023 1321 Last data filed at 08/08/2023 2000 Gross per 24 hour  Intake 270 ml  Output --  Net 270 ml   Filed Weights   08/05/23 2227  Weight: 64.9 kg   Examination: Physical Exam:  Constitutional: Elderly, [place]ck[place] ill-appearing Caucasian female who appears a little uncomfortable Respiratory: Diminished to auscultation bilaterally, no wheezing, rales, rhonchi or crackles. Normal respiratory effort and patient is not tachypenic. No accessory muscle use.  Unlabored breathing Cardiovascular: Irregularly irregular and tachycardic.  No appreciable  murmurs noted.  No appreciable extremity edema noted Abdomen: Soft, non-tender, non-distended. No masses palpated. No appreciable hepatosplenomegaly. Bowel sounds positive.  GU: Deferred. Musculoskeletal: Right arm is  wrapped Skin: No rashes, lesions, ulcers. No induration; Warm and dry.  Neurologic: CN 2-12 grossly intact with no focal deficits. Romberg sign and cerebellar reflexes not assessed.  Psychiatric: She is awake and alert but pleasantly confused a little bit  Data Reviewed: I have personally reviewed following labs and imaging studies  CBC: Recent Labs  Lab 08/06/23 0248 08/06/23 0253 08/07/23 0724 08/08/23 0606 08/09/23 0700  WBC 11.2*  --  11.0* 8.6 9.8  NEUTROABS 9.0*  --   --   --   --   HGB 12.4 12.9 11.8* 10.4* 10.7*  HCT 38.4 38.0 36.0 31.6* 31.6*  MCV 98.0  --  98.1 96.0 95.8  PLT 207  --  177 135* 151   Basic Metabolic Panel: Recent Labs  Lab 08/06/23 0253 08/06/23 0428 08/07/23 0724 08/08/23 0606 08/09/23 0700  NA 140 138 139 135 137  K 3.8 3.8 4.0 3.6 3.6  CL 100 101 97* 97* 99  CO2  --  27 31 28 27   GLUCOSE 132* 126* 120* 106* 104*  BUN 39* 39* 35* 24* 16  CREATININE 1.00 0.94 0.96 0.76 0.66  CALCIUM   --  9.7 9.4 8.7* 9.0  MG  --  2.2 2.1 2.0 1.9   GFR: Estimated Creatinine Clearance: 42.1 mL/min (by C-G formula based on SCr of 0.66 mg/dL). Liver Function Tests: No results for input(s): "AST", "ALT", "ALKPHOS", "BILITOT", "PROT", "ALBUMIN " in the last 168 hours. No results for input(s): "LIPASE", "AMYLASE" in the last 168 hours. No results for input(s): "AMMONIA" in the last 168 hours. Coagulation Profile: Recent Labs  Lab 08/06/23 0428 08/09/23 0700  INR 1.2 1.2   Cardiac Enzymes: No results for input(s): "CKTOTAL", "CKMB", "CKMBINDEX", "TROPONINI" in the last 168 hours. BNP (last 3 results) Recent Labs    11/15/22 1153  PROBNP 877.0*   HbA1C: No results for input(s): "HGBA1C" in the last 72 hours. CBG: No results for  input(s): "GLUCAP" in the last 168 hours. Lipid Profile: No results for input(s): "CHOL", "HDL", "LDLCALC", "TRIG", "CHOLHDL", "LDLDIRECT" in the last 72 hours. Thyroid  Function Tests: No results for input(s): "TSH", "T4TOTAL", "FREET4", "T3FREE", "THYROIDAB" in the last 72 hours. Anemia Panel: No results for input(s): "VITAMINB12", "FOLATE", "FERRITIN", "TIBC", "IRON", "RETICCTPCT" in the last 72 hours. Sepsis Labs: No results for input(s): "PROCALCITON", "LATICACIDVEN" in the last 168 hours.  Recent Results (from the past 240 hours)  Surgical PCR screen     Status: Abnormal   Collection Time: 08/09/23  4:08 AM   Specimen: Nasal Mucosa; Nasal Swab  Result Value Ref Range Status   MRSA, PCR NEGATIVE NEGATIVE Final   Staphylococcus aureus POSITIVE (A) NEGATIVE Final    Comment: (NOTE) The Xpert SA Assay (FDA approved for NASAL specimens in patients 36 years of age and older),  is one component of a comprehensive surveillance program. It is not intended to diagnose infection nor to guide or monitor treatment. Performed at [facility], [address], [place], [place] [address]     Radiology Studies: No results found.  Scheduled Meds:  digoxin   0.125 mg Oral Q M,W,F   metoprolol   200 mg Oral Daily   pantoprazole   20 mg Oral Daily   povidone-iodine    Topical Once   sodium chloride  flush  3 mL Intravenous Q12H   venlafaxine  XR  75 mg Oral q morning   Continuous Infusions:  potassium chloride  10 mEq (08/09/23 1232)    LOS: 3 days   Aura Leeds, DO [facility] Available via Epic secure chat 7am-7pm After these hours, please refer to coverage provider listed on [email or web address] 08/09/2023, 1:21 PM

## 2023-08-09 NOTE — Progress Notes (Signed)
 Pt complaining potassium is burning too much and she wants it to stop. I decrease the rate to the lowest possible and she is still uncomfortable. I notified Dr. Eveline Hipps and he advised to d'c potassium. Potassium stopped per MD.

## 2023-08-09 NOTE — Care Management Important Message (Signed)
 Important Message  Patient Details  Name: NAMINE ECCLESTON MRN: [id number] Date of Birth: 28-Dec-1930   Important Message Given:  Yes - Medicare IM     Felix Host 08/09/2023, 12:36 PM

## 2023-08-09 NOTE — Progress Notes (Signed)
 Pt transported to surgery

## 2023-08-09 NOTE — Plan of Care (Signed)
   Problem: Activity: Goal: Risk for activity intolerance will decrease Outcome: Progressing   Problem: Coping: Goal: Level of anxiety will decrease Outcome: Progressing   Problem: Pain Managment: Goal: General experience of comfort will improve and/or be controlled Outcome: Progressing   Problem: Skin Integrity: Goal: Risk for impaired skin integrity will decrease Outcome: Progressing

## 2023-08-09 NOTE — Progress Notes (Signed)
 Pt says she is hearing and seeing things that are not there. I notified Dr. Eveline Hipps. He will adjust pt's pain medications.

## 2023-08-09 NOTE — Hospital Course (Addendum)
 The patient [name] is a pleasant 89 y.o. female with medical history significant for atrial fibrillation on Eliquis , chronic HFrEF, depression, and anxiety, now presenting with right wrist injury after a fall. Found to have a R distal radius fracture and Orthopedic Surgery planning Surgical intervention 08/09/23. Last dose of Eliquis  was 08/05/23. Hospitalization has been complicated by some Delirium and A Fib with RVR  She is POD2 and doing ok but WBC was slightly elevated and likely reactive. PT/OT recommending SNF and she is improved and stable for discharge and hand surgery has recommended follow-up in 2 weeks and resuming anticoagulation at discharge..   Assessment and Plan:  R Distal Radius Fracture: S/p ORIF 08/09/2023, C/w Acetaminophen  650 mg po/RC q6hprn Mild Pain. D/C Oxycodone  PRN and add Tramadol  50 mg q12hprn Severe Pain and C/w IV Fentanyl  12.5-50 mcg IV q2hprn. C/w Bowel regimen with Bisacodyl  5 mg po dailyprn Moderate Constipation and C/w Senna-Docusate 1 tab PRN daily. Orthopedic Surgery recommending NWB in the Wrist but ok to Bear weight through forearm on Platform Walker. Resumed Eliquis  at discharge now that is okay with Hand Surgery.    A-Fib w/ RVR:  Eliquis  was on hold due to surgery but after discussion with orthopedic surgery okay to resume today. Was in A Fib w/ RVR with HR in the 120's yesterday but HR's are lower but still elevated. C/w Metoprolol  Succinate 200 mg po Daily and Digoxin  0.125 mg po MWF. CTM on Telemetry and Replace Electrolytes. TSH was 1.526.  Follow-up with cardiology in outpatient setting  Hallucinations: Improved, Likely in the setting of Opiates so will need judicious use of Narcotics. Stop Oxycodone  PRN and start Tramadol . Delirium Precautions. If persists will consider Psychiatry consultation.   Leukocytosis: In the setting of Surgical Intervention. WBC went from 8.6 -> 9.8 -> 14.0 -> 8.7. CTM for S/Sx of Infection. Repeat CBC in the AM   Chronic  HFrEF: C/w Metoprolol  Succuniate 200 mg po daily. IVF now stopped. CTM for S/Sx of Volume Overload. Pt is +2.108 Liters. CTM and resume Torsemide     Depression/Anxiety: C/w Venlafaxine  XR 75 mg po dauly    Hypovolemia: Resolved; IV fluids stopped   Normocytic Anemia: Hgb/Hct went from 12.9/38.0 -> 11.8/36.0 -> 10.4/31.6 -> 10.7/31.6 -> 10.3/30.9 -> 9.8/29.2. Checked Anemia Panel and showed an Iron level of 38, UIBC of 199, TIBC 237, Saturation rations of 16, Ferritin 193, Folate 17.4, Vitamin B12 of 1269. CTM for S/Sx of Bleeding; No overt bleeding noted. PT/INR is now 15.0/1.2. CTM and Trend and repeat CBC in the AM  GERD/GI Prophylaxis: C/w Pantoprazole  20 mg po Daily    Hyperbilirubinemia: Likely reactive. T Bili is now gone from 2.0 -> 1.1. CTM and Trend and repeat CMP in the AM  Hypoalbuminemia: Patient's Albumin  Level is now 2.5. CTM and Trend and repeat CMP in the AM

## 2023-08-09 NOTE — Anesthesia Preprocedure Evaluation (Signed)
 Anesthesia Evaluation  Patient identified by MRN, date of birth, ID band Patient awake    Reviewed: Allergy & Precautions, NPO status , Patient's Chart, lab work & pertinent test results  Airway Mallampati: II  TM Distance: >3 FB Neck ROM: Full    Dental  (+) Dental Advisory Given   Pulmonary shortness of breath   breath sounds clear to auscultation       Cardiovascular hypertension, Pt. on medications and Pt. on home beta blockers + Peripheral Vascular Disease and +CHF  + dysrhythmias Atrial Fibrillation  Rhythm:Irregular Rate:Normal     Neuro/Psych negative neurological ROS     GI/Hepatic Neg liver ROS,GERD  ,,  Endo/Other  negative endocrine ROS    Renal/GU negative Renal ROS     Musculoskeletal  (+) Arthritis ,    Abdominal   Peds  Hematology  (+) Blood dyscrasia, anemia   Anesthesia Other Findings   Reproductive/Obstetrics                             Anesthesia Physical Anesthesia Plan  ASA: 4  Anesthesia Plan: Regional   Post-op Pain Management: Regional block*   Induction:   PONV Risk Score and Plan: 2 and Ondansetron , Propofol  infusion and Treatment may vary due to age or medical condition  Airway Management Planned: Mask and Natural Airway  Additional Equipment:   Intra-op Plan:   Post-operative Plan:   Informed Consent:   Plan Discussed with:   Anesthesia Plan Comments:        Anesthesia Quick Evaluation

## 2023-08-09 NOTE — Anesthesia Procedure Notes (Signed)
 Anesthesia Regional Block: Axillary brachial plexus block   Pre-Anesthetic Checklist: , timeout performed,  Correct Patient, Correct Site, Correct Laterality,  Correct Procedure, Correct Position, site marked,  Risks and benefits discussed,  Surgical consent,  Pre-op evaluation,  At surgeon's request and post-op pain management  Laterality: Right  Prep: chloraprep       Needles:  Injection technique: Single-shot  Needle Type: Echogenic Stimulator Needle     Needle Length: 9cm  Needle Gauge: 21     Additional Needles:   Procedures:, nerve stimulator,,, ultrasound used (permanent image in chart),,     Nerve Stimulator or Paresthesia:  Response: MC, Median, ulnar responses elicited, 0.5 mA  Additional Responses:   Narrative:  Start time: 08/09/2023 3:32 PM End time: 08/09/2023 3:40 PM Injection made incrementally with aspirations every 5 mL.  Performed by: Personally  Anesthesiologist: Peggy Bowens, MD

## 2023-08-09 NOTE — Progress Notes (Addendum)
 PLAN FOR SURGERY TODAY ON RIGHT WRIST ORIF RIGHT WRIST ORDERS PLACED SURGERY LATER THIS  AFTERNOON   R/B/A DISCUSSED WITH PT IN OFFICE.  PT VOICED UNDERSTANDING OF PLAN CONSENT SIGNED DAY OF SURGERY PT SEEN AND EXAMINED PRIOR TO OPERATIVE PROCEDURE/DAY OF SURGERY SITE MARKED. QUESTIONS ANSWERED WILL GO HOME FOLLOWING SURGERY    WE ARE PLANNING SURGERY FOR YOUR UPPER EXTREMITY. THE RISKS AND BENEFITS OF SURGERY INCLUDE BUT NOT LIMITED TO BLEEDING INFECTION, DAMAGE TO NEARBY NERVES ARTERIES TENDONS, FAILURE OF SURGERY TO ACCOMPLISH ITS INTENDED GOALS, PERSISTENT SYMPTOMS AND NEED FOR FURTHER SURGICAL INTERVENTION. WITH THIS IN MIND WE WILL PROCEED. I HAVE DISCUSSED WITH THE PATIENT THE PRE AND POSTOPERATIVE REGIMEN AND THE DOS AND DON'TS. PT VOICED UNDERSTANDING AND INFORMED CONSENT SIGNED.

## 2023-08-09 NOTE — Op Note (Signed)
 PREOPERATIVE DIAGNOSIS: Right wrist intra-articular distal radius fracture 3 more fragments  POSTOPERATIVE DIAGNOSIS: Same  ATTENDING SURGEON: Dr. Arvil Birks who scrubbed and present for the entire procedure  ASSISTANT SURGEON: None  ANESTHESIA: Regional with IV sedation  OPERATIVE PROCEDURE: Open treatment of right wrist intra-articular distal radius fracture 3 more fragments requiring internal fixation Right wrist brachial radialis tendon release, tendon tenotomy Radiographs 3 views right wrist  IMPLANTS: [employer] DVR cross lock standard  EBL: Minimal  RADIOGRAPHIC INTERPRETATION: AP lateral and oblique views of the wrist do show the volar plate fixation in place with good alignment in all planes.  SURGICAL INDICATIONS: Patient is a right-hand-dominant female who sustained a closed injury to her right distal radius.  The patient was living independently.  Patient was seen and evaluated and recommended undergo the above procedure.  Risks of surgery include but not limited to bleeding infection damage nearby nerves arteries or tendons loss of motion of the wrist and digits nonunion malunion hardware failure need for further surgical invention.  A signed informed consent was obtained on the day of surgery.  SURGICAL TECHNIQUE: Patient was properly identified in the preoperative holding area marked for marker made the right wrist indicate the correct operative site.  Patient then brought back the operating placed supine on the anesthesia table preoperative antibiotics were given prior to skin incision.  Well-padded tourniquet was then placed on the right brachium and stay with the appropriate drape.  The right upper extremities then prepped and draped normal sterile fashion.  A timeout was called the correct site was identified the procedure then began.  Attention was then turned to the right wrist the limb was then elevated using Esmarch semination the tourniquet insufflated.  Dissection carried  down through the skin and subcutaneous tissue.  The FCR sheath was opened proximally distally.  Going through the floor of the FCR sheath the dissection carried down this to move the FPL out of the way.  The pronator quadratus was then elevated but remained of the pronator quadratus.  In order to reduce the comminuted radial column separate intervention was then undertaken to release the brachial radialis and tendon tenotomy was then carried out of the brachial radialis.  This was a separate intervention to restore articular congruity.  This was an intra-articular fracture 3 more fragments.  Following this open reduction was then performed the volar plate was then applied.  It was held distally with K wires plate position was then confirmed using the mini C arm.  Following this drill fixation was then carried out and moving from an ulnar to radial direction a combination of locking smooth pegs and 1 multidirectional screw was then placed within the radial column.  Following this final shot fixation was then carried out proximal with combination of locking nonlocking screws.  The wound was then thoroughly irrigated.  After copious wound irrigation portion of the pronator quadratus was then closed with 2-0 Vicryl suture.  The subcutaneous tissues closed with Monocryl.  The skin was then closed using horizontal mattress 4-0 Vicryl Rapide..  Adaptic dressing sterile compressive bandage was applied.  Kerlix rolls cast padding and the patient was placed in a well-padded sugar-tong splint.  Patient then taken recovery in good condition.  POSTOPERATIVE PLAN: Patient be admitted back to the internal medicine service.  Be discharged when her pain is controlled to the skilled nursing facility.  Nonweightbearing in the wrist but okay to bear weight through the forearm on a platform walker.  I will need  to see her back in 2 weeks x-rays out of the splint and then begin transition into a short arm cast.  Therapy regimen at the  4-week mark.  Radiographs each visit.

## 2023-08-09 NOTE — Transfer of Care (Signed)
 Immediate Anesthesia Transfer of Care Note  Patient: [name]  Procedure(s) Performed: OPEN REDUCTION INTERNAL FIXATION (ORIF) DISTAL RADIUS FRACTURE (Right)  Patient Location: PACU  Anesthesia Type:MAC and MAC combined with regional for post-op pain  Level of Consciousness: awake and alert   Airway & Oxygen Therapy: Patient Spontanous Breathing and Patient connected to face mask oxygen  Post-op Assessment: Report given to RN and Post -op Vital signs reviewed and stable  Post vital signs: Reviewed and stable  Last Vitals:  Vitals Value Taken Time  BP 124/46 08/09/23 1706  Temp 36.6 C 08/09/23 1700  Pulse 79 08/09/23 1710  Resp 25 08/09/23 1710  SpO2 95 % 08/09/23 1710  Vitals shown include unfiled device data.  Last Pain:  Vitals:   08/09/23 1700  TempSrc:   PainSc: 0-No pain         Complications: No notable events documented.

## 2023-08-10 ENCOUNTER — Other Ambulatory Visit: Payer: Self-pay | Admitting: Family Medicine

## 2023-08-10 ENCOUNTER — Encounter (HOSPITAL_COMMUNITY): Payer: Self-pay | Admitting: Orthopedic Surgery

## 2023-08-10 DIAGNOSIS — I5042 Chronic combined systolic (congestive) and diastolic (congestive) heart failure: Secondary | ICD-10-CM | POA: Diagnosis not present

## 2023-08-10 DIAGNOSIS — F419 Anxiety disorder, unspecified: Secondary | ICD-10-CM | POA: Diagnosis not present

## 2023-08-10 DIAGNOSIS — D72829 Elevated white blood cell count, unspecified: Secondary | ICD-10-CM

## 2023-08-10 DIAGNOSIS — I4891 Unspecified atrial fibrillation: Secondary | ICD-10-CM | POA: Diagnosis not present

## 2023-08-10 DIAGNOSIS — D649 Anemia, unspecified: Secondary | ICD-10-CM

## 2023-08-10 DIAGNOSIS — S52571A Other intraarticular fracture of lower end of right radius, initial encounter for closed fracture: Secondary | ICD-10-CM | POA: Diagnosis not present

## 2023-08-10 LAB — CBC WITH DIFFERENTIAL/PLATELET
Abs Immature Granulocytes: 0.07 10*3/uL (ref 0.00–0.07)
Basophils Absolute: 0 10*3/uL (ref 0.0–0.1)
Basophils Relative: 0 %
Eosinophils Absolute: 0.1 10*3/uL (ref 0.0–0.5)
Eosinophils Relative: 1 %
HCT: 30.9 % — ABNORMAL LOW (ref 36.0–46.0)
Hemoglobin: 10.3 g/dL — ABNORMAL LOW (ref 12.0–15.0)
Immature Granulocytes: 1 %
Lymphocytes Relative: 7 %
Lymphs Abs: 0.9 10*3/uL (ref 0.7–4.0)
MCH: 32.5 pg (ref 26.0–34.0)
MCHC: 33.3 g/dL (ref 30.0–36.0)
MCV: 97.5 fL (ref 80.0–100.0)
Monocytes Absolute: 1 10*3/uL (ref 0.1–1.0)
Monocytes Relative: 7 %
Neutro Abs: 11.9 10*3/uL — ABNORMAL HIGH (ref 1.7–7.7)
Neutrophils Relative %: 84 %
Platelets: 177 10*3/uL (ref 150–400)
RBC: 3.17 MIL/uL — ABNORMAL LOW (ref 3.87–5.11)
RDW: 12.8 % (ref 11.5–15.5)
WBC: 14 10*3/uL — ABNORMAL HIGH (ref 4.0–10.5)
nRBC: 0 % (ref 0.0–0.2)

## 2023-08-10 LAB — COMPREHENSIVE METABOLIC PANEL WITH GFR
ALT: 12 U/L (ref 0–44)
AST: 23 U/L (ref 15–41)
Albumin: 2.7 g/dL — ABNORMAL LOW (ref 3.5–5.0)
Alkaline Phosphatase: 67 U/L (ref 38–126)
Anion gap: 13 (ref 5–15)
BUN: 17 mg/dL (ref 8–23)
CO2: 23 mmol/L (ref 22–32)
Calcium: 8.9 mg/dL (ref 8.9–10.3)
Chloride: 99 mmol/L (ref 98–111)
Creatinine, Ser: 0.81 mg/dL (ref 0.44–1.00)
GFR, Estimated: >60 mL/min (ref >60)
Glucose, Bld: 121 mg/dL — ABNORMAL HIGH (ref 70–99)
Potassium: 3.9 mmol/L (ref 3.5–5.1)
Sodium: 135 mmol/L (ref 135–145)
Total Bilirubin: 2 mg/dL — ABNORMAL HIGH (ref 0.0–1.2)
Total Protein: 5.9 g/dL — ABNORMAL LOW (ref 6.5–8.1)

## 2023-08-10 LAB — TSH: TSH: 1.526 u[IU]/mL (ref 0.350–4.500)

## 2023-08-10 LAB — PHOSPHORUS: Phosphorus: 2.5 mg/dL (ref 2.5–4.6)

## 2023-08-10 LAB — MAGNESIUM: Magnesium: 1.9 mg/dL (ref 1.7–2.4)

## 2023-08-10 MED ORDER — CHLORHEXIDINE GLUCONATE CLOTH 2 % EX PADS
6.0000 | MEDICATED_PAD | Freq: Every day | CUTANEOUS | Status: DC
Start: 2023-08-10 — End: 2023-08-11
  Administered 2023-08-10 – 2023-08-11 (×2): 6 via TOPICAL

## 2023-08-10 MED ORDER — MUPIROCIN 2 % EX OINT
1.0000 | TOPICAL_OINTMENT | Freq: Two times a day (BID) | CUTANEOUS | Status: DC
Start: 2023-08-10 — End: 2023-08-11
  Administered 2023-08-10 – 2023-08-11 (×3): 1 via NASAL
  Filled 2023-08-10 (×2): qty 22

## 2023-08-10 NOTE — Progress Notes (Signed)
 Mobility Specialist Progress Note:   08/10/23 1210  Mobility  Activity Transferred from bed to chair  Level of Assistance Minimal assist, patient does 75% or more  Assistive Device  (HHA)  Distance Ambulated (ft) 3 ft  RUE Weight Bearing Per Provider Order NWB  Activity Response Tolerated well  Mobility Referral Yes  Mobility visit 1 Mobility  Mobility Specialist Start Time (ACUTE ONLY) [id number]  Mobility Specialist Stop Time (ACUTE ONLY) 0944  Mobility Specialist Time Calculation (min) (ACUTE ONLY) 20 min   Pt received in bed, agreeable to transfer to chair. SB to come to EOB. MinA to stand and pivot to chair with HHA. Pt needing verbal directional cues for step placement. No unsteadiness or knee buckling present. Pt left in chair with call bell in reach and all needs met. Chair alarm on.   Sofia Dunn  Mobility Specialist Please contact via SecureChat Rehab office at [phone]

## 2023-08-10 NOTE — Plan of Care (Signed)
  Problem: Education: Goal: Knowledge of General Education information will improve Description: Including pain rating scale, medication(s)/side effects and non-pharmacologic comfort measures Outcome: Not Progressing   Problem: Health Behavior/Discharge Planning: Goal: Ability to manage health-related needs will improve Outcome: Not Progressing   Problem: Clinical Measurements: Goal: Ability to maintain clinical measurements within normal limits will improve Outcome: Progressing Goal: Will remain free from infection Outcome: Progressing Goal: Diagnostic test results will improve Outcome: Progressing Goal: Respiratory complications will improve Outcome: Progressing Goal: Cardiovascular complication will be avoided Outcome: Progressing   Problem: Activity: Goal: Risk for activity intolerance will decrease Outcome: Progressing   Problem: Nutrition: Goal: Adequate nutrition will be maintained Outcome: Progressing   Problem: Coping: Goal: Level of anxiety will decrease Outcome: Progressing   Problem: Elimination: Goal: Will not experience complications related to bowel motility Outcome: Progressing Goal: Will not experience complications related to urinary retention Outcome: Progressing   Problem: Pain Managment: Goal: General experience of comfort will improve and/or be controlled Outcome: Progressing   Problem: Safety: Goal: Ability to remain free from injury will improve Outcome: Progressing   Problem: Skin Integrity: Goal: Risk for impaired skin integrity will decrease Outcome: Progressing

## 2023-08-10 NOTE — Progress Notes (Signed)
 PROGRESS NOTE    [name]  WGN:[id number] DOB: 04-Aug-1930 DOA: 08/05/2023 PCP: Donnie Galea, MD   Brief Narrative:  The patient [name] is a pleasant 89 y.o. female with medical history significant for atrial fibrillation on Eliquis , chronic HFrEF, depression, and anxiety, now presenting with right wrist injury after a fall. Found to have a R distal radius fracture and Orthopedic Surgery planning Surgical intervention 08/09/23. Last dose of Eliquis  was 08/05/23. Hospitalization has been complicated by some Delirium and A Fib with RVR  She is POD1 and doing ok but WBC is slightly elevated and likely reactive. PT/OT recommending SNF.   Assessment and Plan:  R Distal Radius Fracture: S/p ORIF 08/09/2023, C/w Acetaminophen  650 mg po/RC q6hprn Mild Pain. D/C Oxycodone  PRN and add Tramadol  50 mg q12hprn Severe Pain and C/w IV Fentanyl  12.5-50 mcg IV q2hprn. C/w Bowel regimen with Bisacodyl  5 mg po dailyprn Moderate Constipation and C/w Senna-Docusate 1 tab PRN daily. Orthopedic Surgery recommending NWB in the Wrist but ok to Bear weight through forearm on Platform Walker. Resume Eliquis  when ok with Hand Surgery.    A-Fib w/ RVR:  Eliquis  was on hold due to surgery. Defer to Ortho about when to resume. Was in A Fib w/ RVR with HR in the 120's yesterday but HR's are lower but still elevated. /w Metoprolol  Succinate 200 mg po Daily and Digoxin  0.125 mg po MWF. CTM on Telemetry and Replace Electrolytes. TSH was 1.526  Hallucinations: Improved, Likely in the setting of Opiates so will need judicious use of Narcotics. Stop Oxycodone  PRN and start Tramadol . Delirium Precautions. If persists will consider Psychiatry consultation.   Leukocytosis: In the setting of Surgical Intervention. WBC went from 8.6 -> 9.8 -> 14.0. CTM for S/Sx of Infection. Repeat CBC in the AM   Chronic HFrEF: C/w Metoprolol  Succuniate 200 mg po daily. IVF now stopped. CTM for S/Sx of Volume Overload. Pt is +2.108  Liters. CTM    Depression/Anxiety: C/w Venlafaxine  XR 75 mg po dauly    Hypovolemia: Resolved; IV fluids stopped   Normocytic Anemia: Hgb/Hct went from 12.9/38.0 -> 11.8/36.0 -> 10.4/31.6 -> 10.7/31.6 -> 10.3/30.9. Check Anemia Panel in the AM. CTM for S/Sx of Bleeding; No overt bleeding noted. PT/INR is now 15.0/1.2. CTM and Trend and repeat CBC in the AM  GERD/GI Prophylaxis: C/w Pantoprazole  20 mg po Daily    Hyperbilirubinemia: Likely reactive. T Bili is now 2.0. CTM and Trend and repeat CMP in the AM  Hypoalbuminemia: Patient's Albumin  Level is now 2.7. CTM and Trend and repeat CMP in the AM   DVT prophylaxis: SCD's Start: 08/09/23 1758 SCDs Start: 08/06/23 0405    Code Status: Do not attempt resuscitation (DNR) PRE-ARREST INTERVENTIONS DESIRED Family Communication: No family present at bedside   Disposition Plan:  Level of care: Telemetry Medical Status is: Inpatient Remains inpatient appropriate because: Needs SNF and Insurance Authorization   Consultants:  Orthopedic Hand Surgery  Procedures:  As delineated as above  Antimicrobials:  Anti-infectives (From admission, onward)    Start     Dose/Rate Route Frequency Ordered Stop   08/09/23 1100  ceFAZolin  (ANCEF ) IVPB 2g/100 mL premix        2 g 200 mL/hr over 30 Minutes Intravenous On call to O.R. 08/09/23 1012 08/09/23 1202       Subjective: Seen and examined at bedside and she is sitting in chair and doing little bit better.  Complaining about some hand soreness but nothing is too  bad.  She is not as anxious today and states her heart is not "fluttering".  No lightheadedness or dizziness.  No other concerns or complaints at this time.  Objective: Vitals:   08/09/23 2348 08/10/23 0513 08/10/23 0804 08/10/23 1137  BP: (!) 118/54 109/68 95/60 (!) 104/57  Pulse: 95 86 79 81  Resp: 18 20 17 17   Temp: 98.6 F (37 C) 97.7 F (36.5 C) 98.1 F (36.7 C) (!) 97.5 F (36.4 C)  TempSrc: Oral Oral Oral Oral  SpO2: 92%  95% 93% 99%  Weight:      Height:        Intake/Output Summary (Last 24 hours) at 08/10/2023 1606 Last data filed at 08/10/2023 0805 Gross per 24 hour  Intake 347.16 ml  Output 300 ml  Net 47.16 ml   Filed Weights   08/05/23 2227 08/09/23 1502  Weight: 64.9 kg 66.2 kg   Examination: Physical Exam:  Constitutional: Elderly chronically ill-appearing Caucasian female who is sitting in the chair at bedside who is more comfortable Respiratory: Diminished to auscultation bilaterally with coarse breath sounds, no wheezing, rales, rhonchi or crackles. Normal respiratory effort and patient is not tachypenic. No accessory muscle use.  Unlabored breathing Cardiovascular: Irregularly irregular mildly tachycardic, no murmurs / rubs / gallops. S1 and S2 auscultated.  Mild lower extremity edema Abdomen: Soft, non-tender, distended secondary body habitus. Bowel sounds positive.  GU: Deferred. Musculoskeletal: Right arm is wrapped Skin: No rashes, lesions, ulcers on limited skin evaluation. No induration; Warm and dry.  Neurologic: CN 2-12 grossly intact with no focal deficits. Romberg sign and cerebellar reflexes not assessed.  Psychiatric: She is awake and alert and not as confused as she was yesterday.  Data Reviewed: I have personally reviewed following labs and imaging studies  CBC: Recent Labs  Lab 08/06/23 0248 08/06/23 0253 08/07/23 0724 08/08/23 0606 08/09/23 0700 08/10/23 0644  WBC 11.2*  --  11.0* 8.6 9.8 14.0*  NEUTROABS 9.0*  --   --   --   --  11.9*  HGB 12.4 12.9 11.8* 10.4* 10.7* 10.3*  HCT 38.4 38.0 36.0 31.6* 31.6* 30.9*  MCV 98.0  --  98.1 96.0 95.8 97.5  PLT 207  --  177 135* 151 177   Basic Metabolic Panel: Recent Labs  Lab 08/06/23 0428 08/07/23 0724 08/08/23 0606 08/09/23 0700 08/10/23 0644  NA 138 139 135 137 135  K 3.8 4.0 3.6 3.6 3.9  CL 101 97* 97* 99 99  CO2 27 31 28 27 23   GLUCOSE 126* 120* 106* 104* 121*  BUN 39* 35* 24* 16 17  CREATININE 0.94  0.96 0.76 0.66 0.81  CALCIUM  9.7 9.4 8.7* 9.0 8.9  MG 2.2 2.1 2.0 1.9 1.9  PHOS  --   --   --   --  2.5   GFR: Estimated Creatinine Clearance: 41.5 mL/min (by C-G formula based on SCr of 0.81 mg/dL). Liver Function Tests: Recent Labs  Lab 08/10/23 0644  AST 23  ALT 12  ALKPHOS 67  BILITOT 2.0*  PROT 5.9*  ALBUMIN  2.7*   No results for input(s): "LIPASE", "AMYLASE" in the last 168 hours. No results for input(s): "AMMONIA" in the last 168 hours. Coagulation Profile: Recent Labs  Lab 08/06/23 0428 08/09/23 0700  INR 1.2 1.2   Cardiac Enzymes: No results for input(s): "CKTOTAL", "CKMB", "CKMBINDEX", "TROPONINI" in the last 168 hours. BNP (last 3 results) Recent Labs    11/15/22 1153  PROBNP 877.0*   HbA1C: No results  for input(s): "HGBA1C" in the last 72 hours. CBG: No results for input(s): "GLUCAP" in the last 168 hours. Lipid Profile: No results for input(s): "CHOL", "HDL", "LDLCALC", "TRIG", "CHOLHDL", "LDLDIRECT" in the last 72 hours. Thyroid  Function Tests: Recent Labs    08/10/23 0644  TSH 1.526   Anemia Panel: No results for input(s): "VITAMINB12", "FOLATE", "FERRITIN", "TIBC", "IRON", "RETICCTPCT" in the last 72 hours. Sepsis Labs: No results for input(s): "PROCALCITON", "LATICACIDVEN" in the last 168 hours.  Recent Results (from the past 240 hours)  Surgical PCR screen     Status: Abnormal   Collection Time: 08/09/23  4:08 AM   Specimen: Nasal Mucosa; Nasal Swab  Result Value Ref Range Status   MRSA, PCR NEGATIVE NEGATIVE Final   Staphylococcus aureus POSITIVE (A) NEGATIVE Final    Comment: (NOTE) The Xpert SA Assay (FDA approved for NASAL specimens in patients 40 years of age and older), is one component of a comprehensive surveillance program. It is not intended to diagnose infection nor to guide or monitor treatment. Performed at [facility], [address], [place], [place] [address]     Radiology Studies: DG MINI C-ARM IMAGE  ONLY Result Date: 08/09/2023 There is no interpretation for this exam.  This order is for images obtained during a surgical procedure.  Please See "Surgeries" Tab for more information regarding the procedure.   Scheduled Meds:  Chlorhexidine  Gluconate Cloth  6 each Topical Daily   digoxin   0.125 mg Oral Q M,W,F   metoprolol   200 mg Oral Daily   mupirocin  ointment  1 Application Nasal BID   pantoprazole   20 mg Oral Daily   sodium chloride  flush  3 mL Intravenous Q12H   venlafaxine  XR  75 mg Oral q morning   Continuous Infusions:   LOS: 4 days   Aura Leeds, DO [facility] Available via Epic secure chat 7am-7pm After these hours, please refer to coverage provider listed on [email or web address] 08/10/2023, 4:06 PM

## 2023-08-10 NOTE — TOC Progression Note (Signed)
 Transition of Care [facility]) - Progression Note    Patient Details  Name: [name] MRN: [id number] Date of Birth: 1930/05/05  [facility] CM/SW Contact  Katrinka Parr, [place] Phone Number: 08/10/2023, 4:26 PM  Clinical Narrative:     CSW confirmed with [facility] they can still offer a bed. SNF auth request initiated in online portal. Ref# [id number] [facility] is pending.  Expected Discharge Plan: Skilled Nursing Facility Barriers to Discharge: insurance authorization  Expected Discharge Plan and Services In-house Referral: Clinical Social Work   Post Acute Care Choice: Skilled Nursing Facility Living arrangements for the past 2 months: Single Family Home                                       Social Determinants of Health (SDOH) Interventions SDOH Screenings   Food Insecurity: No Food Insecurity (08/06/2023)  Housing: Low Risk  (08/06/2023)  Transportation Needs: No Transportation Needs (08/06/2023)  Utilities: Not At Risk (08/06/2023)  Alcohol  Screen: Low Risk  (08/11/2022)  Depression (PHQ2-9): High Risk (05/12/2023)  Financial Resource Strain: Low Risk  (07/12/2021)  Physical Activity: Sufficiently Active (08/11/2022)  Social Connections: Moderately Integrated (08/06/2023)  Stress: No Stress Concern Present (08/11/2022)  Tobacco Use: Low Risk  (08/09/2023)    Readmission Risk Interventions     No data to display

## 2023-08-10 NOTE — Progress Notes (Signed)
 Physical Therapy Treatment Patient Details Name: [name] MRN: [id number] DOB: 02-10-1931 Today's Date: 08/10/2023   History of Present Illness Pt is a 89 y.o. female presenting 4/26 with obvious deformity to R wrist following a fall. S/p R arm reduction and splinting 4/27. S/p internal fixation, R wrist brachioradialis tendon release, tendon tenotomy 4/30. PMH significant for afib, chronic HFrEF, depression, anxiety, L hemi hip arthroplasty (2021), ORIF tibial plateau 2024.    PT Comments  Pt introduced R platform RW, given pt can bear weight through forearm/elbow s/p fixation. Pt tolerating repeated transfers and short-distance room gait with use of platform RW, overall pt requiring min physical assist for mobility at this time. Pt motivated to progress mobility, and describes min pain of RUE and low back only. PT to continue to follow.     If plan is discharge home, recommend the following: A lot of help with walking and/or transfers;A lot of help with bathing/dressing/bathroom;Assistance with cooking/housework;Assist for transportation;Help with stairs or ramp for entrance   Can travel by private vehicle        Equipment Recommendations  None recommended by PT    Recommendations for Other Services       Precautions / Restrictions Precautions Precautions: Fall Restrictions Weight Bearing Restrictions Per Provider Order: Yes RUE Weight Bearing Per Provider Order: Weight bear through elbow only Other Position/Activity Restrictions: "Nonweightbearing in the wrist but okay to bear weight through the forearm on a platform walker" in op note 4/30     Mobility  Bed Mobility Overal bed mobility: Needs Assistance             General bed mobility comments: up in chair    Transfers Overall transfer level: Needs assistance Equipment used: 1 person hand held assist Transfers: Sit to/from Stand Sit to Stand: Min assist           General transfer comment: assist for  power up, rise, steadying. stand x2, from recliner both times. Cues for LUE hand placement, and placement of RUE on platform once standing/dismounting RUE from platform before sitting.    Ambulation/Gait Ambulation/Gait assistance: Min assist Gait Distance (Feet): 20 Feet Assistive device: Rolling walker (2 wheels) Gait Pattern/deviations: Step-through pattern, Decreased stride length, Trunk flexed Gait velocity: decr     General Gait Details: assist to steady, maneuver RW. Cues for upright posture, placement in RW   Stairs             Wheelchair Mobility     Tilt Bed    Modified Rankin (Stroke Patients Only)       Balance Overall balance assessment: Needs assistance Sitting-balance support: No upper extremity supported, Feet supported Sitting balance-Leahy Scale: Good     Standing balance support: Single extremity supported, Reliant on assistive device for balance Standing balance-Leahy Scale: Poor                              Communication Communication Communication: No apparent difficulties Factors Affecting Communication: Hearing impaired  Cognition Arousal: Alert Behavior During Therapy: WFL for tasks assessed/performed   PT - Cognitive impairments: No apparent impairments                       PT - Cognition Comments: Pt stating "my mind is going" and "I am still having hallucinations, but they're better" Following commands: Intact      Cueing Cueing Techniques: Verbal cues  Exercises  General Comments        Pertinent Vitals/Pain Pain Assessment Pain Assessment: Faces Faces Pain Scale: Hurts little more Pain Location: RUE Pain Descriptors / Indicators: Grimacing, Guarding, Discomfort Pain Intervention(s): Limited activity within patient's tolerance, Monitored during session, Repositioned    Home Living                          Prior Function            PT Goals (current goals can now be found in  the care plan section) Acute Rehab PT Goals Patient Stated Goal: to improve mobility quality and reduce falls risk PT Goal Formulation: With patient Time For Goal Achievement: 08/21/23 Potential to Achieve Goals: Fair Progress towards PT goals: Progressing toward goals    Frequency    Min 2X/week      PT Plan      Co-evaluation              AM-PAC PT "6 Clicks" Mobility   Outcome Measure  Help needed turning from your back to your side while in a flat bed without using bedrails?: A Little Help needed moving from lying on your back to sitting on the side of a flat bed without using bedrails?: A Little Help needed moving to and from a bed to a chair (including a wheelchair)?: A Little Help needed standing up from a chair using your arms (e.g., wheelchair or bedside chair)?: A Little Help needed to walk in hospital room?: A Lot Help needed climbing 3-5 steps with a railing? : Total 6 Click Score: 15    End of Session   Activity Tolerance: Patient tolerated treatment well Patient left: in chair;with call bell/phone within reach;with chair alarm set Nurse Communication: Mobility status PT Visit Diagnosis: Other abnormalities of gait and mobility (R26.89);Muscle weakness (generalized) (M62.81);History of falling (Z91.81)     Time: 9604-5409 PT Time Calculation (min) (ACUTE ONLY): 21 min  Charges:    $Therapeutic Activity: 8-22 mins PT General Charges $$ ACUTE PT VISIT: 1 Visit                     Shirlene Doughty, PT DPT Acute Rehabilitation Services Secure Chat Preferred  Office [phone]    Arwin Bisceglia Cydney Draft 08/10/2023, 2:46 PM

## 2023-08-11 DIAGNOSIS — K5900 Constipation, unspecified: Secondary | ICD-10-CM | POA: Diagnosis not present

## 2023-08-11 DIAGNOSIS — I10 Essential (primary) hypertension: Secondary | ICD-10-CM | POA: Diagnosis not present

## 2023-08-11 DIAGNOSIS — I5042 Chronic combined systolic (congestive) and diastolic (congestive) heart failure: Secondary | ICD-10-CM | POA: Diagnosis not present

## 2023-08-11 DIAGNOSIS — I272 Pulmonary hypertension, unspecified: Secondary | ICD-10-CM | POA: Diagnosis not present

## 2023-08-11 DIAGNOSIS — R29810 Facial weakness: Secondary | ICD-10-CM | POA: Diagnosis not present

## 2023-08-11 DIAGNOSIS — I739 Peripheral vascular disease, unspecified: Secondary | ICD-10-CM | POA: Diagnosis not present

## 2023-08-11 DIAGNOSIS — J309 Allergic rhinitis, unspecified: Secondary | ICD-10-CM | POA: Diagnosis not present

## 2023-08-11 DIAGNOSIS — M810 Age-related osteoporosis without current pathological fracture: Secondary | ICD-10-CM | POA: Diagnosis not present

## 2023-08-11 DIAGNOSIS — E46 Unspecified protein-calorie malnutrition: Secondary | ICD-10-CM | POA: Diagnosis not present

## 2023-08-11 DIAGNOSIS — M25551 Pain in right hip: Secondary | ICD-10-CM | POA: Diagnosis not present

## 2023-08-11 DIAGNOSIS — Z743 Need for continuous supervision: Secondary | ICD-10-CM | POA: Diagnosis not present

## 2023-08-11 DIAGNOSIS — R112 Nausea with vomiting, unspecified: Secondary | ICD-10-CM | POA: Diagnosis not present

## 2023-08-11 DIAGNOSIS — R600 Localized edema: Secondary | ICD-10-CM | POA: Diagnosis not present

## 2023-08-11 DIAGNOSIS — F419 Anxiety disorder, unspecified: Secondary | ICD-10-CM | POA: Diagnosis not present

## 2023-08-11 DIAGNOSIS — Z7901 Long term (current) use of anticoagulants: Secondary | ICD-10-CM | POA: Diagnosis not present

## 2023-08-11 DIAGNOSIS — S52501D Unspecified fracture of the lower end of right radius, subsequent encounter for closed fracture with routine healing: Secondary | ICD-10-CM | POA: Diagnosis not present

## 2023-08-11 DIAGNOSIS — R404 Transient alteration of awareness: Secondary | ICD-10-CM | POA: Diagnosis not present

## 2023-08-11 DIAGNOSIS — I872 Venous insufficiency (chronic) (peripheral): Secondary | ICD-10-CM | POA: Diagnosis not present

## 2023-08-11 DIAGNOSIS — W19XXXA Unspecified fall, initial encounter: Secondary | ICD-10-CM | POA: Diagnosis not present

## 2023-08-11 DIAGNOSIS — Z7401 Bed confinement status: Secondary | ICD-10-CM | POA: Diagnosis not present

## 2023-08-11 DIAGNOSIS — E559 Vitamin D deficiency, unspecified: Secondary | ICD-10-CM | POA: Diagnosis not present

## 2023-08-11 DIAGNOSIS — R2689 Other abnormalities of gait and mobility: Secondary | ICD-10-CM | POA: Diagnosis not present

## 2023-08-11 DIAGNOSIS — S52571A Other intraarticular fracture of lower end of right radius, initial encounter for closed fracture: Secondary | ICD-10-CM | POA: Diagnosis not present

## 2023-08-11 DIAGNOSIS — R2681 Unsteadiness on feet: Secondary | ICD-10-CM | POA: Diagnosis not present

## 2023-08-11 DIAGNOSIS — I2510 Atherosclerotic heart disease of native coronary artery without angina pectoris: Secondary | ICD-10-CM | POA: Diagnosis not present

## 2023-08-11 DIAGNOSIS — I4891 Unspecified atrial fibrillation: Secondary | ICD-10-CM | POA: Diagnosis not present

## 2023-08-11 DIAGNOSIS — I5032 Chronic diastolic (congestive) heart failure: Secondary | ICD-10-CM | POA: Diagnosis not present

## 2023-08-11 DIAGNOSIS — M6281 Muscle weakness (generalized): Secondary | ICD-10-CM | POA: Diagnosis not present

## 2023-08-11 DIAGNOSIS — Z4889 Encounter for other specified surgical aftercare: Secondary | ICD-10-CM | POA: Diagnosis not present

## 2023-08-11 DIAGNOSIS — I499 Cardiac arrhythmia, unspecified: Secondary | ICD-10-CM | POA: Diagnosis not present

## 2023-08-11 DIAGNOSIS — I447 Left bundle-branch block, unspecified: Secondary | ICD-10-CM | POA: Diagnosis not present

## 2023-08-11 DIAGNOSIS — K219 Gastro-esophageal reflux disease without esophagitis: Secondary | ICD-10-CM | POA: Diagnosis not present

## 2023-08-11 DIAGNOSIS — I482 Chronic atrial fibrillation, unspecified: Secondary | ICD-10-CM | POA: Diagnosis not present

## 2023-08-11 DIAGNOSIS — D649 Anemia, unspecified: Secondary | ICD-10-CM | POA: Diagnosis not present

## 2023-08-11 LAB — CBC WITH DIFFERENTIAL/PLATELET
Abs Immature Granulocytes: 0.05 10*3/uL (ref 0.00–0.07)
Basophils Absolute: 0 10*3/uL (ref 0.0–0.1)
Basophils Relative: 0 %
Eosinophils Absolute: 0.3 10*3/uL (ref 0.0–0.5)
Eosinophils Relative: 3 %
HCT: 29.2 % — ABNORMAL LOW (ref 36.0–46.0)
Hemoglobin: 9.8 g/dL — ABNORMAL LOW (ref 12.0–15.0)
Immature Granulocytes: 1 %
Lymphocytes Relative: 18 %
Lymphs Abs: 1.5 10*3/uL (ref 0.7–4.0)
MCH: 32.5 pg (ref 26.0–34.0)
MCHC: 33.6 g/dL (ref 30.0–36.0)
MCV: 96.7 fL (ref 80.0–100.0)
Monocytes Absolute: 0.7 10*3/uL (ref 0.1–1.0)
Monocytes Relative: 8 %
Neutro Abs: 6.1 10*3/uL (ref 1.7–7.7)
Neutrophils Relative %: 70 %
Platelets: 174 10*3/uL (ref 150–400)
RBC: 3.02 MIL/uL — ABNORMAL LOW (ref 3.87–5.11)
RDW: 12.9 % (ref 11.5–15.5)
WBC: 8.7 10*3/uL (ref 4.0–10.5)
nRBC: 0 % (ref 0.0–0.2)

## 2023-08-11 LAB — COMPREHENSIVE METABOLIC PANEL WITH GFR
ALT: 10 U/L (ref 0–44)
AST: 19 U/L (ref 15–41)
Albumin: 2.5 g/dL — ABNORMAL LOW (ref 3.5–5.0)
Alkaline Phosphatase: 59 U/L (ref 38–126)
Anion gap: 11 (ref 5–15)
BUN: 26 mg/dL — ABNORMAL HIGH (ref 8–23)
CO2: 25 mmol/L (ref 22–32)
Calcium: 9 mg/dL (ref 8.9–10.3)
Chloride: 100 mmol/L (ref 98–111)
Creatinine, Ser: 0.69 mg/dL (ref 0.44–1.00)
GFR, Estimated: >60 mL/min
Glucose, Bld: 87 mg/dL (ref 70–99)
Potassium: 3.6 mmol/L (ref 3.5–5.1)
Sodium: 136 mmol/L (ref 135–145)
Total Bilirubin: 1.1 mg/dL (ref 0.0–1.2)
Total Protein: 5.6 g/dL — ABNORMAL LOW (ref 6.5–8.1)

## 2023-08-11 LAB — FOLATE: Folate: 17.4 ng/mL

## 2023-08-11 LAB — PHOSPHORUS: Phosphorus: 2.9 mg/dL (ref 2.5–4.6)

## 2023-08-11 LAB — RETICULOCYTES
Immature Retic Fract: 25.9 % — ABNORMAL HIGH (ref 2.3–15.9)
RBC.: 3.05 MIL/uL — ABNORMAL LOW (ref 3.87–5.11)
Retic Count, Absolute: 100.3 10*3/uL (ref 19.0–186.0)
Retic Ct Pct: 3.3 % — ABNORMAL HIGH (ref 0.4–3.1)

## 2023-08-11 LAB — IRON AND TIBC
Iron: 38 ug/dL (ref 28–170)
Saturation Ratios: 16 % (ref 10.4–31.8)
TIBC: 237 ug/dL — ABNORMAL LOW (ref 250–450)
UIBC: 199 ug/dL

## 2023-08-11 LAB — VITAMIN B12: Vitamin B-12: 1269 pg/mL — ABNORMAL HIGH (ref 180–914)

## 2023-08-11 LAB — FERRITIN: Ferritin: 193 ng/mL (ref 11–307)

## 2023-08-11 LAB — MAGNESIUM: Magnesium: 2 mg/dL (ref 1.7–2.4)

## 2023-08-11 MED ORDER — MUPIROCIN 2 % EX OINT: 1.0000 | TOPICAL_OINTMENT | Freq: Two times a day (BID) | CUTANEOUS | 0 refills | Status: DC

## 2023-08-11 MED ORDER — SENNOSIDES-DOCUSATE SODIUM 8.6-50 MG PO TABS: 1.0000 | ORAL_TABLET | Freq: Every evening | ORAL | 0 refills | Status: DC | PRN

## 2023-08-11 MED ORDER — TORSEMIDE 20 MG PO TABS
20.0000 mg | ORAL_TABLET | Freq: Every day | ORAL | Status: DC
  Administered 2023-08-11: 20 mg via ORAL
  Filled 2023-08-11: qty 1

## 2023-08-11 MED ORDER — TRAMADOL HCL 50 MG PO TABS: 50.0000 mg | ORAL_TABLET | Freq: Two times a day (BID) | ORAL | 0 refills | Status: AC | PRN

## 2023-08-11 MED ORDER — APIXABAN 2.5 MG PO TABS
2.5000 mg | ORAL_TABLET | Freq: Two times a day (BID) | ORAL | Status: DC
Start: 2023-08-11 — End: 2023-08-22

## 2023-08-11 MED ORDER — BISACODYL 5 MG PO TBEC: 5.0000 mg | DELAYED_RELEASE_TABLET | Freq: Every day | ORAL | 0 refills | Status: DC | PRN

## 2023-08-11 NOTE — Discharge Summary (Signed)
 Physician Discharge Summary   Patient: [name] MRN: [id number] DOB: 09/19/1930  Admit date:     08/05/2023  Discharge date: 08/11/23  Discharge Physician: Aura Leeds, DO   PCP: Donnie Galea, MD   Recommendations at discharge:   Follow-up with PCP within 1 to 2 weeks repeat CBC, CMP, mag, Phos within 1 week Follow-up with hand surgery Dr. Annamae Barrett within 2 weeks Follow-up with cardiology in outpatient setting  Discharge Diagnoses: Principal Problem:   Closed fracture of right distal radius Active Problems:   Chronic combined systolic and diastolic heart failure (HCC)   Anxiety and depression   Atrial fibrillation with RVR- CHADs VASc=4   Moderate to severe pulmonary hypertension (HCC)  Resolved Problems:   * No resolved hospital problems. [facility]: The patient [name] is a pleasant 89 y.o. female with medical history significant for atrial fibrillation on Eliquis , chronic HFrEF, depression, and anxiety, now presenting with right wrist injury after a fall. Found to have a R distal radius fracture and Orthopedic Surgery planning Surgical intervention 08/09/23. Last dose of Eliquis  was 08/05/23. Hospitalization has been complicated by some Delirium and A Fib with RVR  She is POD2 and doing ok but WBC was slightly elevated and likely reactive. PT/OT recommending SNF and she is improved and stable for discharge and hand surgery has recommended follow-up in 2 weeks and resuming anticoagulation at discharge..   Assessment and Plan:  R Distal Radius Fracture: S/p ORIF 08/09/2023, C/w Acetaminophen  650 mg po/RC q6hprn Mild Pain. D/C Oxycodone  PRN and add Tramadol  50 mg q12hprn Severe Pain and C/w IV Fentanyl  12.5-50 mcg IV q2hprn. C/w Bowel regimen with Bisacodyl  5 mg po dailyprn Moderate Constipation and C/w Senna-Docusate 1 tab PRN daily. Orthopedic Surgery recommending NWB in the Wrist but ok to Bear weight through forearm on Platform Walker. Resumed Eliquis  at  discharge now that is okay with Hand Surgery.    A-Fib w/ RVR:  Eliquis  was on hold due to surgery but after discussion with orthopedic surgery okay to resume today. Was in A Fib w/ RVR with HR in the 120's yesterday but HR's are lower but still elevated. C/w Metoprolol  Succinate 200 mg po Daily and Digoxin  0.125 mg po MWF. CTM on Telemetry and Replace Electrolytes. TSH was 1.526.  Follow-up with cardiology in outpatient setting  Hallucinations: Improved, Likely in the setting of Opiates so will need judicious use of Narcotics. Stop Oxycodone  PRN and start Tramadol . Delirium Precautions. If persists will consider Psychiatry consultation.   Leukocytosis: In the setting of Surgical Intervention. WBC went from 8.6 -> 9.8 -> 14.0 -> 8.7. CTM for S/Sx of Infection. Repeat CBC in the AM   Chronic HFrEF: C/w Metoprolol  Succuniate 200 mg po daily. IVF now stopped. CTM for S/Sx of Volume Overload. Pt is +2.108 Liters. CTM and resume Torsemide     Depression/Anxiety: C/w Venlafaxine  XR 75 mg po dauly    Hypovolemia: Resolved; IV fluids stopped   Normocytic Anemia: Hgb/Hct went from 12.9/38.0 -> 11.8/36.0 -> 10.4/31.6 -> 10.7/31.6 -> 10.3/30.9 -> 9.8/29.2. Checked Anemia Panel and showed an Iron level of 38, UIBC of 199, TIBC 237, Saturation rations of 16, Ferritin 193, Folate 17.4, Vitamin B12 of 1269. CTM for S/Sx of Bleeding; No overt bleeding noted. PT/INR is now 15.0/1.2. CTM and Trend and repeat CBC in the AM  GERD/GI Prophylaxis: C/w Pantoprazole  20 mg po Daily    Hyperbilirubinemia: Likely reactive. T Bili is now gone from 2.0 -> 1.1. CTM  and Trend and repeat CMP in the AM  Hypoalbuminemia: Patient's Albumin  Level is now 2.5. CTM and Trend and repeat CMP in the AM  Consultants: Hand surgery Procedures performed: As delineated as above Disposition: Skilled nursing facility Diet recommendation:  Discharge Diet Orders (From admission, onward)     Start     Ordered   08/11/23 0000  Diet - low  sodium heart healthy        08/11/23 1242           Cardiac diet DISCHARGE MEDICATION: Allergies as of 08/11/2023       Reactions   Ace Inhibitors Cough   Warfarin Sodium Other (See Comments)   DOSE RELATED PHARMACOLOGIC EFFECT "bleed out"   Famotidine  Other (See Comments)   GI upset   Vancomycin  Nausea Only   Codeine Other (See Comments)   sedation   Delsym [dextromethorphan Polistirex Er] Other (See Comments)   dizziness   Lactose Intolerance (gi) Diarrhea   Lasix  [furosemide ] Diarrhea   Oral Lasix  gives her diarrhea, tolerates IV Rx   Phenylephrine  Palpitations, Other (See Comments)   Nasal spray- "likely increase in nasal congestion".    Sulfamethoxazole-trimethoprim Nausea And Vomiting   GI intolerance.        Medication List     TAKE these medications    acetaminophen  500 MG tablet Commonly known as: TYLENOL  Take 2 tablets (1,000 mg total) by mouth every 8 (eight) hours as needed.   apixaban  2.5 MG Tabs tablet Commonly known as: Eliquis  Take 1 tablet (2.5 mg total) by mouth 2 (two) times daily.   bisacodyl  5 MG EC tablet Commonly known as: DULCOLAX Take 1 tablet (5 mg total) by mouth daily as needed for moderate constipation.   calcium  carbonate 500 MG chewable tablet Commonly known as: TUMS - dosed in mg elemental calcium  Chew 500 mg by mouth 2 (two) times daily as needed for indigestion or heartburn.   cholecalciferol  25 MCG (1000 UNIT) tablet Commonly known as: VITAMIN D3 Take 1,000 Units by mouth in the morning.   digoxin  0.125 MG tablet Commonly known as: LANOXIN  Take 1 tablet (0.125 mg total) by mouth every Monday, Wednesday, and Friday.   fluticasone  50 MCG/ACT nasal spray Commonly known as: FLONASE  SPRAY 2 SPRAYS INTO EACH NOSTRIL EVERY DAY What changed: See the new instructions.   IVIZIA DRY EYES OP Apply 2 drops to eye Once PRN.   Klor-Con  M20 20 MEQ tablet Generic drug: potassium chloride  SA TAKE 2 TABLETS BY MOUTH DAILY What  changed:  how much to take when to take this   loperamide  2 MG capsule Commonly known as: IMODIUM  Take 2 mg by mouth 3 (three) times daily as needed for diarrhea or loose stools.   loratadine 10 MG tablet Commonly known as: CLARITIN Take 10 mg by mouth daily as needed for allergies.   metoprolol  200 MG 24 hr tablet Commonly known as: Toprol  XL Take 1 tablet (200 mg total) by mouth daily.   mupirocin  ointment 2 % Commonly known as: BACTROBAN  Place 1 Application into the nose 2 (two) times daily.   ondansetron  4 MG tablet Commonly known as: Zofran  Take 1 tablet (4 mg total) by mouth every 8 (eight) hours as needed for nausea or vomiting.   pantoprazole  20 MG tablet Commonly known as: PROTONIX  TAKE 1 TABLET BY MOUTH EVERY DAY   senna-docusate 8.6-50 MG tablet Commonly known as: Senokot-S Take 1 tablet by mouth at bedtime as needed for mild constipation.   torsemide   20 MG tablet Commonly known as: DEMADEX  Take 1 tablet (20 mg total) by mouth daily.   traMADol  50 MG tablet Commonly known as: ULTRAM  Take 1 tablet (50 mg total) by mouth every 12 (twelve) hours as needed for up to 3 days for severe pain (pain score 7-10).   venlafaxine  XR 37.5 MG 24 hr capsule Commonly known as: EFFEXOR -XR TAKE 2 CAPSULES (75 MG TOTAL) BY MOUTH EVERY MORNING.        Follow-up Information     Schedule an appointment as soon as possible for a visit  with Arvil Birks, MD.   Specialty: Orthopedic Surgery Contact information: [address] [place] [address] [phone]                Discharge Exam: Cleavon Curls Weights   08/05/23 2227 08/09/23 1502  Weight: 64.9 kg 66.2 kg   Vitals:   08/11/23 0921 08/11/23 0922  BP:  105/65  Pulse: 97 97  Resp:    Temp:    SpO2:     Examination: Physical Exam:  Constitutional: Elderly chronically ill-appearing Caucasian female sitting in chair at bedside in no acute distress Respiratory: Diminished to auscultation  bilaterally, no wheezing, rales, rhonchi or crackles. Normal respiratory effort and patient is not tachypenic. No accessory muscle use. Unlabored breathing  Cardiovascular: Irregularly Irregular, no murmurs / rubs / gallops. S1 and S2 auscultated. No extremity edema.  Abdomen: Soft, non-tender, non distended. Bowel sounds positive.  GU: Deferred. Musculoskeletal: Right arm and wrist is wrapped Skin: No rashes, lesions, ulcers on a limited skin evaluation. No induration; Warm and dry.  Neurologic: CN 2-12 grossly intact with no focal deficits. Romberg sign and cerebellar reflexes not assessed.  Psychiatric: Normal judgment and insight. Alert and oriented x 3. Normal mood and appropriate affect.   Condition at discharge: stable  The results of significant diagnostics from this hospitalization (including imaging, microbiology, ancillary and laboratory) are listed below for reference.   Imaging Studies: DG MINI C-ARM IMAGE ONLY Result Date: 08/09/2023 There is no interpretation for this exam.  This order is for images obtained during a surgical procedure.  Please See "Surgeries" Tab for more information regarding the procedure.   DG Wrist Complete Right Addendum Date: 08/06/2023 ADDENDUM REPORT: 08/06/2023 02:43 ADDENDUM: The images are not labeled correctly on the lateral view. However, the images are of the right wrist and the dictation refers to the images of the right wrist. No other changes are made to this report. Electronically Signed   By: Worthy Heads M.D.   On: 08/06/2023 02:43   Result Date: 08/06/2023 CLINICAL DATA:  Fall, right wrist injury EXAM: RIGHT WRIST - COMPLETE 3+ VIEW COMPARISON:  None Available. FINDINGS: Comminuted intra-articular, impacted fracture of the distal left radius is seen with roughly 1 cm override, 1/2 shaft with dorsal displacement, and 50-60 degrees dorsal angulation of the distal fracture fragment. Fracture plane extends into the distal radial articular  surface with a 3 mm articular gap best appreciated lateral examination. Marked dorsal tilt the distal radial articular surface. No dislocation. Resultant ulnar positive variance. Extensive surrounding soft tissue swelling. IMPRESSION: 1. Comminuted, impacted, angulated intra-articular fracture of the distal left radius. Electronically Signed: By: Worthy Heads M.D. On: 08/05/2023 23:57   DG Wrist Complete Right Result Date: 08/06/2023 CLINICAL DATA:  Post reduction EXAM: RIGHT WRIST - COMPLETE 3+ VIEW COMPARISON:  08/05/2023 FINDINGS: Comminuted, intra-articular distal radial impaction fracture. Mild dorsal displacement of the distal fracture fragments on the lateral view,  although with improved alignment. Overlying cast obscures fine osseous detail. IMPRESSION: Distal radial fracture with improved alignment, as above. Electronically Signed   By: Zadie Herter M.D.   On: 08/06/2023 02:26   CT Head Wo Contrast Result Date: 08/06/2023 CLINICAL DATA:  Fall EXAM: CT HEAD WITHOUT CONTRAST CT CERVICAL SPINE WITHOUT CONTRAST TECHNIQUE: Multidetector CT imaging of the head and cervical spine was performed following the standard protocol without intravenous contrast. Multiplanar CT image reconstructions of the cervical spine were also generated. RADIATION DOSE REDUCTION: This exam was performed according to the departmental dose-optimization program which includes automated exposure control, adjustment of the mA and/or kV according to patient size and/or use of iterative reconstruction technique. COMPARISON:  06/26/2022 FINDINGS: CT HEAD FINDINGS Brain: No evidence of acute infarction, hemorrhage, hydrocephalus, extra-axial collection or mass lesion/mass effect. Subcortical white matter and periventricular small vessel ischemic changes. Vascular: No hyperdense vessel or unexpected calcification. Skull: Normal. Negative for fracture or focal lesion. Sinuses/Orbits: The visualized paranasal sinuses are essentially  clear. The mastoid air cells are unopacified. Other: None. CT CERVICAL SPINE FINDINGS Alignment: Normal cervical lordosis. Skull base and vertebrae: No acute fracture. No primary bone lesion or focal pathologic process. Soft tissues and spinal canal: No prevertebral fluid or swelling. No visible canal hematoma. Disc levels: Mild degenerative changes of the mid/lower cervical spine. Spinal canal is patent. Upper chest: Visualized lung apices are clear. Other: None. IMPRESSION: No acute intracranial abnormality. Small vessel ischemic changes. No traumatic injury to the cervical spine. Mild degenerative changes. Electronically Signed   By: Zadie Herter M.D.   On: 08/06/2023 01:21   CT Cervical Spine Wo Contrast Result Date: 08/06/2023 CLINICAL DATA:  Fall EXAM: CT HEAD WITHOUT CONTRAST CT CERVICAL SPINE WITHOUT CONTRAST TECHNIQUE: Multidetector CT imaging of the head and cervical spine was performed following the standard protocol without intravenous contrast. Multiplanar CT image reconstructions of the cervical spine were also generated. RADIATION DOSE REDUCTION: This exam was performed according to the departmental dose-optimization program which includes automated exposure control, adjustment of the mA and/or kV according to patient size and/or use of iterative reconstruction technique. COMPARISON:  06/26/2022 FINDINGS: CT HEAD FINDINGS Brain: No evidence of acute infarction, hemorrhage, hydrocephalus, extra-axial collection or mass lesion/mass effect. Subcortical white matter and periventricular small vessel ischemic changes. Vascular: No hyperdense vessel or unexpected calcification. Skull: Normal. Negative for fracture or focal lesion. Sinuses/Orbits: The visualized paranasal sinuses are essentially clear. The mastoid air cells are unopacified. Other: None. CT CERVICAL SPINE FINDINGS Alignment: Normal cervical lordosis. Skull base and vertebrae: No acute fracture. No primary bone lesion or focal  pathologic process. Soft tissues and spinal canal: No prevertebral fluid or swelling. No visible canal hematoma. Disc levels: Mild degenerative changes of the mid/lower cervical spine. Spinal canal is patent. Upper chest: Visualized lung apices are clear. Other: None. IMPRESSION: No acute intracranial abnormality. Small vessel ischemic changes. No traumatic injury to the cervical spine. Mild degenerative changes. Electronically Signed   By: Zadie Herter M.D.   On: 08/06/2023 01:21   Microbiology: Results for orders placed or performed during the hospital encounter of 08/05/23  Surgical PCR screen     Status: Abnormal   Collection Time: 08/09/23  4:08 AM   Specimen: Nasal Mucosa; Nasal Swab  Result Value Ref Range Status   MRSA, PCR NEGATIVE NEGATIVE Final   Staphylococcus aureus POSITIVE (A) NEGATIVE Final    Comment: (NOTE) The Xpert SA Assay (FDA approved for NASAL specimens in patients 69 years of age and  older), is one component of a comprehensive surveillance program. It is not intended to diagnose infection nor to guide or monitor treatment. Performed at [facility], [address] [place], [place] [address]    Labs: CBC: Recent Labs  Lab 08/06/23 0248 08/06/23 0253 08/07/23 0724 08/08/23 0606 08/09/23 0700 08/10/23 0644 08/11/23 0449  WBC 11.2*  --  11.0* 8.6 9.8 14.0* 8.7  NEUTROABS 9.0*  --   --   --   --  11.9* 6.1  HGB 12.4   < > 11.8* 10.4* 10.7* 10.3* 9.8*  HCT 38.4   < > 36.0 31.6* 31.6* 30.9* 29.2*  MCV 98.0  --  98.1 96.0 95.8 97.5 96.7  PLT 207  --  177 135* 151 177 174   < > = values in this interval not displayed.   Basic Metabolic Panel: Recent Labs  Lab 08/07/23 0724 08/08/23 0606 08/09/23 0700 08/10/23 0644 08/11/23 0449  NA 139 135 137 135 136  K 4.0 3.6 3.6 3.9 3.6  CL 97* 97* 99 99 100  CO2 31 28 27 23 25   GLUCOSE 120* 106* 104* 121* 87  BUN 35* 24* 16 17 26*  CREATININE 0.96 0.76 0.66 0.81 0.69  CALCIUM  9.4 8.7* 9.0 8.9 9.0   MG 2.1 2.0 1.9 1.9 2.0  PHOS  --   --   --  2.5 2.9   Liver Function Tests: Recent Labs  Lab 08/10/23 0644 08/11/23 0449  AST 23 19  ALT 12 10  ALKPHOS 67 59  BILITOT 2.0* 1.1  PROT 5.9* 5.6*  ALBUMIN  2.7* 2.5*   CBG: No results for input(s): "GLUCAP" in the last 168 hours.  Discharge time spent: greater than 30 minutes.  Signed: Aura Leeds, DO [facility] 08/11/2023

## 2023-08-11 NOTE — Progress Notes (Signed)
 AVS completed; copy placed with patient chart.

## 2023-08-11 NOTE — Anesthesia Postprocedure Evaluation (Signed)
 Anesthesia Post Note  Patient: [name]  Procedure(s) Performed: OPEN REDUCTION INTERNAL FIXATION (ORIF) DISTAL RADIUS FRACTURE (Right)     Patient location during evaluation: PACU Anesthesia Type: Regional Level of consciousness: awake and alert Pain management: pain level controlled Vital Signs Assessment: post-procedure vital signs reviewed and stable Respiratory status: spontaneous breathing, nonlabored ventilation, respiratory function stable and patient connected to nasal cannula oxygen Cardiovascular status: stable and blood pressure returned to baseline Postop Assessment: no apparent nausea or vomiting Anesthetic complications: no   No notable events documented.  Last Vitals:  Vitals:   08/10/23 2040 08/11/23 0440  BP: (!) 99/56 117/70  Pulse: 76 89  Resp: 20 20  Temp: 36.5 C 36.8 C  SpO2: 96% 94%    Last Pain:  Vitals:   08/11/23 0440  TempSrc: Oral  PainSc:    Pain Goal:                   Melvenia Stabs

## 2023-08-11 NOTE — Progress Notes (Signed)
 Patient's IV has been removed per d/c order to nursing facility. Patient tolerated well, site is clean and dry. Report has been given to Miranda America, LPN at [facility]. [facility] has been called. Patient is awaiting transport, call light within reach, bed in lowest position.

## 2023-08-11 NOTE — TOC Transition Note (Signed)
 Transition of Care [facility]) - Discharge Note   Patient Details  Name: [name] MRN: [id number] Date of Birth: 1930/09/27  Transition [facility]) CM/SW Contact:  Katrinka Parr, LCSW Phone Number: 08/11/2023, 1:32 PM   Clinical Narrative:     Auth approved 5/2- 5/6. Auth#: [id number]  Per MD patient ready for DC to [facility]. RN, patient, patient's family, and facility notified of DC. Discharge Summary and FL2 sent to facility. RN to call report prior to discharge [phone] ). DC packet on chart. Ambulance transport requested for patient.   CSW will sign off for now as social work intervention is no longer needed. Please consult us  again if new needs arise.   Final next level of care: Skilled Nursing Facility Barriers to Discharge: No Barriers Identified   Patient Goals and CMS Choice Patient states their goals for this hospitalization and ongoing recovery are:: SNF          Discharge Placement              Patient chooses bed at: [facility] [place] [facility] Patient to be transferred to facility by: [facility] Name of family member notified: Daughter Caryl Clas Patient and family notified of of transfer: 08/11/23  Discharge Plan and Services Additional resources added to the After Visit Summary for   In-house Referral: Clinical Social Work   Post Acute Care Choice: Skilled Nursing Facility                               Social Drivers of Health (SDOH) Interventions SDOH Screenings   Food Insecurity: No Food Insecurity (08/06/2023)  Housing: Low Risk  (08/06/2023)  Transportation Needs: No Transportation Needs (08/06/2023)  Utilities: Not At Risk (08/06/2023)  Alcohol  Screen: Low Risk  (08/11/2022)  Depression (PHQ2-9): High Risk (05/12/2023)  Financial Resource Strain: Low Risk  (07/12/2021)  Physical Activity: Sufficiently Active (08/11/2022)  Social Connections: Moderately Integrated (08/06/2023)  Stress: No Stress Concern Present (08/11/2022)  Tobacco Use:  Low Risk  (08/09/2023)     Readmission Risk Interventions     No data to display

## 2023-08-11 NOTE — Progress Notes (Signed)
 Mobility Specialist Progress Note:   08/11/23 1142  Mobility  Activity Ambulated with assistance in room  Level of Assistance Minimal assist, patient does 75% or more  Assistive Device Front wheel walker (with platform for RUE)  Distance Ambulated (ft) 25 ft  RUE Weight Bearing Per Provider Order Weight bear through elbow only  Activity Response Tolerated well  Mobility Referral Yes  Mobility visit 1 Mobility  Mobility Specialist Start Time (ACUTE ONLY) 0915  Mobility Specialist Stop Time (ACUTE ONLY) [id number]  Mobility Specialist Time Calculation (min) (ACUTE ONLY) 13 min   Pt received in chair, agreeable to mobility. MinA to stand. CG during ambulation. Pt c/o R. Hip pain and fatigue, otherwise asx throughout. No unsteadiness present with platform walker. Pt returned to chair with RN present in room.   Sofia Dunn  Mobility Specialist Please contact via [employer] office at [phone]

## 2023-08-11 NOTE — Progress Notes (Signed)
 Occupational Therapy Treatment Patient Details Name: [name] MRN: [id number] DOB: [birth year] Today's Date: 08/11/2023   History of present illness Pt is a 89 y.o. female presenting 4/26 with obvious deformity to R wrist following a fall. S/p R arm reduction and splinting 4/27. S/p internal fixation, R wrist brachioradialis tendon release, tendon tenotomy 4/30. PMH significant for afib, chronic HFrEF, depression, anxiety, L hemi hip arthroplasty (2021), ORIF tibial plateau 2024.   OT comments  Patient continues to subjectively feel better and is needing up to Mod A for sit to stand, but once up is CGA for short bouts of mobility.  She is able to participate more with ADL from a seated position, but still needing up to Mod A for completion.  Patient is scheduled for discharge to SNF today per daughter in the room.  OT will continue efforts in the acute setting to address deficits.        If plan is discharge home, recommend the following:  A lot of help with walking and/or transfers;A lot of help with bathing/dressing/bathroom;Assistance with cooking/housework;Assist for transportation;Help with stairs or ramp for entrance   Equipment Recommendations  None recommended by OT    Recommendations for Other Services      Precautions / Restrictions Precautions Precautions: Fall Recall of Precautions/Restrictions: Intact Required Braces or Orthoses: Splint/Cast Splint/Cast: short arm cast with ACE wrap Restrictions Weight Bearing Restrictions Per Provider Order: Yes RUE Weight Bearing Per Provider Order: Weight bear through elbow only       Mobility Bed Mobility               General bed mobility comments: up in chair    Transfers Overall transfer level: Needs assistance Equipment used: Rolling walker (2 wheels) Transfers: Sit to/from Stand Sit to Stand: Mod assist                 Balance Overall balance assessment: Needs assistance Sitting-balance support: No  upper extremity supported, Feet supported Sitting balance-Leahy Scale: Good     Standing balance support: Reliant on assistive device for balance Standing balance-Leahy Scale: Poor                             ADL either performed or assessed with clinical judgement   ADL                   Upper Body Dressing : Moderate assistance;Sitting   Lower Body Dressing: Maximal assistance;Sit to/from stand   Toilet Transfer: Minimal assistance;Cueing for safety;Cueing for sequencing;Ambulation;Regular Toilet;Rolling walker (2 wheels)                  Extremity/Trunk Assessment Upper Extremity Assessment RUE Deficits / Details: Now in short arm cast with bruised and swelling to fingers.  Able to make 3/4 fist, move elbow to confines of cast and good shoulder ROM   Lower Extremity Assessment Lower Extremity Assessment: Defer to PT evaluation   Cervical / Trunk Assessment Cervical / Trunk Assessment: Kyphotic    Vision Patient Visual Report: No change from baseline     Perception Perception Perception: Not tested   Praxis Praxis Praxis: Not tested   Communication Communication Communication: No apparent difficulties Factors Affecting Communication: Hearing impaired   Cognition Arousal: Alert Behavior During Therapy: WFL for tasks assessed/performed Cognition: No apparent impairments       Memory impairment (select all impairments): Short-term memory  Following commands: Intact        Cueing   Cueing Techniques: Verbal cues  Exercises      Shoulder Instructions       General Comments  VSS on RA    Pertinent Vitals/ Pain       Pain Assessment Faces Pain Scale: Hurts little more Pain Location: RUE Pain Descriptors / Indicators: Sore Pain Intervention(s): Monitored during session                                                          Frequency  Min 2X/week         Progress Toward Goals  OT Goals(current goals can now be found in the care plan section)  Progress towards OT goals: Progressing toward goals  Acute Rehab OT Goals OT Goal Formulation: With patient Time For Goal Achievement: 08/21/23 Potential to Achieve Goals: Good  Plan      Co-evaluation                 AM-PAC OT "6 Clicks" Daily Activity     Outcome Measure   Help from another person eating meals?: A Little Help from another person taking care of personal grooming?: A Little Help from another person toileting, which includes using toliet, bedpan, or urinal?: A Lot Help from another person bathing (including washing, rinsing, drying)?: A Lot Help from another person to put on and taking off regular upper body clothing?: A Lot Help from another person to put on and taking off regular lower body clothing?: A Lot 6 Click Score: 14    End of Session Equipment Utilized During Treatment: Rolling walker (2 wheels)  OT Visit Diagnosis: Unsteadiness on feet (R26.81);Muscle weakness (generalized) (M62.81);History of falling (Z91.81);Other symptoms and signs involving cognitive function;Pain Pain - Right/Left: Right Pain - part of body: Hand   Activity Tolerance Patient tolerated treatment well   Patient Left in chair;with call bell/phone within reach;with chair alarm set;with family/visitor present   Nurse Communication Mobility status        Time: 9528-4132 OT Time Calculation (min): 26 min  Charges: OT General Charges $OT Visit: 1 Visit OT Treatments $Self Care/Home Management : 8-22 mins $Therapeutic Activity: 8-22 mins  08/11/2023  RP, OTR/L  Acute Rehabilitation Services  Office:  [phone]   [name] 08/11/2023, 12:09 PM

## 2023-08-15 ENCOUNTER — Encounter

## 2023-08-16 DIAGNOSIS — I482 Chronic atrial fibrillation, unspecified: Secondary | ICD-10-CM | POA: Diagnosis not present

## 2023-08-16 DIAGNOSIS — I739 Peripheral vascular disease, unspecified: Secondary | ICD-10-CM | POA: Diagnosis not present

## 2023-08-16 DIAGNOSIS — R2681 Unsteadiness on feet: Secondary | ICD-10-CM | POA: Diagnosis not present

## 2023-08-19 ENCOUNTER — Emergency Department (HOSPITAL_COMMUNITY): Admitting: Certified Registered Nurse Anesthetist

## 2023-08-19 ENCOUNTER — Encounter (HOSPITAL_COMMUNITY): Payer: Self-pay

## 2023-08-19 ENCOUNTER — Inpatient Hospital Stay (HOSPITAL_COMMUNITY)

## 2023-08-19 ENCOUNTER — Emergency Department (HOSPITAL_COMMUNITY)

## 2023-08-19 ENCOUNTER — Inpatient Hospital Stay (HOSPITAL_COMMUNITY)
Admission: EM | Admit: 2023-08-19 | Discharge: 2023-08-22 | DRG: 023 | Disposition: A | Discharge status: Hospice | Source: Skilled Nursing Facility | Attending: Internal Medicine | Admitting: Internal Medicine

## 2023-08-19 ENCOUNTER — Other Ambulatory Visit: Payer: Self-pay

## 2023-08-19 ENCOUNTER — Encounter (HOSPITAL_COMMUNITY)
Admission: EM | Disposition: A | Discharge status: Hospice | Payer: Self-pay | Source: Skilled Nursing Facility | Attending: Pulmonary Disease

## 2023-08-19 DIAGNOSIS — I4891 Unspecified atrial fibrillation: Secondary | ICD-10-CM

## 2023-08-19 DIAGNOSIS — K219 Gastro-esophageal reflux disease without esophagitis: Secondary | ICD-10-CM | POA: Diagnosis present

## 2023-08-19 DIAGNOSIS — Z79899 Other long term (current) drug therapy: Secondary | ICD-10-CM

## 2023-08-19 DIAGNOSIS — I480 Paroxysmal atrial fibrillation: Secondary | ICD-10-CM | POA: Diagnosis not present

## 2023-08-19 DIAGNOSIS — I6782 Cerebral ischemia: Secondary | ICD-10-CM | POA: Diagnosis not present

## 2023-08-19 DIAGNOSIS — Z96653 Presence of artificial knee joint, bilateral: Secondary | ICD-10-CM | POA: Diagnosis present

## 2023-08-19 DIAGNOSIS — M1990 Unspecified osteoarthritis, unspecified site: Secondary | ICD-10-CM | POA: Diagnosis present

## 2023-08-19 DIAGNOSIS — G936 Cerebral edema: Secondary | ICD-10-CM | POA: Diagnosis not present

## 2023-08-19 DIAGNOSIS — R000 Tachycardia, unspecified: Secondary | ICD-10-CM | POA: Diagnosis not present

## 2023-08-19 DIAGNOSIS — I959 Hypotension, unspecified: Secondary | ICD-10-CM | POA: Diagnosis not present

## 2023-08-19 DIAGNOSIS — Z7901 Long term (current) use of anticoagulants: Secondary | ICD-10-CM | POA: Diagnosis not present

## 2023-08-19 DIAGNOSIS — R4701 Aphasia: Secondary | ICD-10-CM | POA: Diagnosis not present

## 2023-08-19 DIAGNOSIS — I610 Nontraumatic intracerebral hemorrhage in hemisphere, subcortical: Secondary | ICD-10-CM | POA: Diagnosis not present

## 2023-08-19 DIAGNOSIS — E785 Hyperlipidemia, unspecified: Secondary | ICD-10-CM | POA: Diagnosis not present

## 2023-08-19 DIAGNOSIS — I6381 Other cerebral infarction due to occlusion or stenosis of small artery: Secondary | ICD-10-CM | POA: Diagnosis not present

## 2023-08-19 DIAGNOSIS — I69391 Dysphagia following cerebral infarction: Secondary | ICD-10-CM | POA: Diagnosis not present

## 2023-08-19 DIAGNOSIS — J9601 Acute respiratory failure with hypoxia: Secondary | ICD-10-CM | POA: Diagnosis present

## 2023-08-19 DIAGNOSIS — R9089 Other abnormal findings on diagnostic imaging of central nervous system: Secondary | ICD-10-CM | POA: Diagnosis not present

## 2023-08-19 DIAGNOSIS — I429 Cardiomyopathy, unspecified: Secondary | ICD-10-CM | POA: Diagnosis not present

## 2023-08-19 DIAGNOSIS — J9690 Respiratory failure, unspecified, unspecified whether with hypoxia or hypercapnia: Secondary | ICD-10-CM

## 2023-08-19 DIAGNOSIS — G8194 Hemiplegia, unspecified affecting left nondominant side: Secondary | ICD-10-CM | POA: Diagnosis not present

## 2023-08-19 DIAGNOSIS — I771 Stricture of artery: Secondary | ICD-10-CM | POA: Diagnosis not present

## 2023-08-19 DIAGNOSIS — R29717 NIHSS score 17: Secondary | ICD-10-CM | POA: Diagnosis not present

## 2023-08-19 DIAGNOSIS — R414 Neurologic neglect syndrome: Secondary | ICD-10-CM | POA: Diagnosis present

## 2023-08-19 DIAGNOSIS — R0989 Other specified symptoms and signs involving the circulatory and respiratory systems: Secondary | ICD-10-CM | POA: Diagnosis not present

## 2023-08-19 DIAGNOSIS — Z831 Family history of other infectious and parasitic diseases: Secondary | ICD-10-CM

## 2023-08-19 DIAGNOSIS — Z9841 Cataract extraction status, right eye: Secondary | ICD-10-CM

## 2023-08-19 DIAGNOSIS — Z515 Encounter for palliative care: Secondary | ICD-10-CM

## 2023-08-19 DIAGNOSIS — I639 Cerebral infarction, unspecified: Principal | ICD-10-CM | POA: Diagnosis present

## 2023-08-19 DIAGNOSIS — F32A Depression, unspecified: Secondary | ICD-10-CM | POA: Diagnosis present

## 2023-08-19 DIAGNOSIS — Q2546 Tortuous aortic arch: Secondary | ICD-10-CM | POA: Diagnosis not present

## 2023-08-19 DIAGNOSIS — Z66 Do not resuscitate: Secondary | ICD-10-CM | POA: Diagnosis not present

## 2023-08-19 DIAGNOSIS — R29712 NIHSS score 12: Secondary | ICD-10-CM | POA: Diagnosis not present

## 2023-08-19 DIAGNOSIS — R471 Dysarthria and anarthria: Secondary | ICD-10-CM | POA: Diagnosis present

## 2023-08-19 DIAGNOSIS — W19XXXD Unspecified fall, subsequent encounter: Secondary | ICD-10-CM | POA: Diagnosis present

## 2023-08-19 DIAGNOSIS — M810 Age-related osteoporosis without current pathological fracture: Secondary | ICD-10-CM | POA: Diagnosis present

## 2023-08-19 DIAGNOSIS — J90 Pleural effusion, not elsewhere classified: Secondary | ICD-10-CM | POA: Diagnosis not present

## 2023-08-19 DIAGNOSIS — I6389 Other cerebral infarction: Secondary | ICD-10-CM | POA: Diagnosis not present

## 2023-08-19 DIAGNOSIS — F418 Other specified anxiety disorders: Secondary | ICD-10-CM | POA: Diagnosis not present

## 2023-08-19 DIAGNOSIS — I6601 Occlusion and stenosis of right middle cerebral artery: Secondary | ICD-10-CM | POA: Diagnosis not present

## 2023-08-19 DIAGNOSIS — S52501D Unspecified fracture of the lower end of right radius, subsequent encounter for closed fracture with routine healing: Secondary | ICD-10-CM

## 2023-08-19 DIAGNOSIS — I272 Pulmonary hypertension, unspecified: Secondary | ICD-10-CM

## 2023-08-19 DIAGNOSIS — I4821 Permanent atrial fibrillation: Secondary | ICD-10-CM | POA: Diagnosis not present

## 2023-08-19 DIAGNOSIS — R29719 NIHSS score 19: Secondary | ICD-10-CM | POA: Diagnosis present

## 2023-08-19 DIAGNOSIS — Z881 Allergy status to other antibiotic agents status: Secondary | ICD-10-CM

## 2023-08-19 DIAGNOSIS — Z90710 Acquired absence of both cervix and uterus: Secondary | ICD-10-CM

## 2023-08-19 DIAGNOSIS — R0602 Shortness of breath: Secondary | ICD-10-CM | POA: Diagnosis not present

## 2023-08-19 DIAGNOSIS — R1310 Dysphagia, unspecified: Secondary | ICD-10-CM | POA: Diagnosis not present

## 2023-08-19 DIAGNOSIS — Z9842 Cataract extraction status, left eye: Secondary | ICD-10-CM

## 2023-08-19 DIAGNOSIS — I447 Left bundle-branch block, unspecified: Secondary | ICD-10-CM | POA: Diagnosis not present

## 2023-08-19 DIAGNOSIS — R918 Other nonspecific abnormal finding of lung field: Secondary | ICD-10-CM | POA: Diagnosis not present

## 2023-08-19 DIAGNOSIS — I700 Atherosclerosis of aorta: Secondary | ICD-10-CM | POA: Diagnosis not present

## 2023-08-19 DIAGNOSIS — Z7189 Other specified counseling: Secondary | ICD-10-CM | POA: Diagnosis not present

## 2023-08-19 DIAGNOSIS — Z888 Allergy status to other drugs, medicaments and biological substances status: Secondary | ICD-10-CM

## 2023-08-19 DIAGNOSIS — I5042 Chronic combined systolic (congestive) and diastolic (congestive) heart failure: Secondary | ICD-10-CM

## 2023-08-19 DIAGNOSIS — I739 Peripheral vascular disease, unspecified: Secondary | ICD-10-CM | POA: Diagnosis not present

## 2023-08-19 DIAGNOSIS — Z800 Family history of malignant neoplasm of digestive organs: Secondary | ICD-10-CM

## 2023-08-19 DIAGNOSIS — I428 Other cardiomyopathies: Secondary | ICD-10-CM | POA: Diagnosis present

## 2023-08-19 DIAGNOSIS — I6523 Occlusion and stenosis of bilateral carotid arteries: Secondary | ICD-10-CM | POA: Diagnosis not present

## 2023-08-19 DIAGNOSIS — I63511 Cerebral infarction due to unspecified occlusion or stenosis of right middle cerebral artery: Secondary | ICD-10-CM | POA: Diagnosis not present

## 2023-08-19 DIAGNOSIS — Z882 Allergy status to sulfonamides status: Secondary | ICD-10-CM

## 2023-08-19 DIAGNOSIS — Z96642 Presence of left artificial hip joint: Secondary | ICD-10-CM | POA: Diagnosis present

## 2023-08-19 DIAGNOSIS — F419 Anxiety disorder, unspecified: Secondary | ICD-10-CM | POA: Diagnosis present

## 2023-08-19 DIAGNOSIS — Z885 Allergy status to narcotic agent status: Secondary | ICD-10-CM

## 2023-08-19 DIAGNOSIS — I110 Hypertensive heart disease with heart failure: Secondary | ICD-10-CM

## 2023-08-19 DIAGNOSIS — I63411 Cerebral infarction due to embolism of right middle cerebral artery: Secondary | ICD-10-CM | POA: Diagnosis not present

## 2023-08-19 DIAGNOSIS — R29810 Facial weakness: Secondary | ICD-10-CM | POA: Diagnosis not present

## 2023-08-19 DIAGNOSIS — I509 Heart failure, unspecified: Secondary | ICD-10-CM | POA: Diagnosis not present

## 2023-08-19 DIAGNOSIS — I619 Nontraumatic intracerebral hemorrhage, unspecified: Secondary | ICD-10-CM | POA: Diagnosis not present

## 2023-08-19 DIAGNOSIS — Z8619 Personal history of other infectious and parasitic diseases: Secondary | ICD-10-CM

## 2023-08-19 DIAGNOSIS — Z961 Presence of intraocular lens: Secondary | ICD-10-CM | POA: Diagnosis present

## 2023-08-19 DIAGNOSIS — Z4682 Encounter for fitting and adjustment of non-vascular catheter: Secondary | ICD-10-CM | POA: Diagnosis not present

## 2023-08-19 HISTORY — PX: IR PERCUTANEOUS ART THROMBECTOMY/INFUSION INTRACRANIAL INC DIAG ANGIO: IMG6087

## 2023-08-19 HISTORY — PX: IR US GUIDE VASC ACCESS RIGHT: IMG2390

## 2023-08-19 HISTORY — PX: IR CT HEAD LTD: IMG2386

## 2023-08-19 HISTORY — PX: RADIOLOGY WITH ANESTHESIA: SHX6223

## 2023-08-19 LAB — LIPID PANEL
Cholesterol: 102 mg/dL (ref 0–200)
HDL: 41 mg/dL (ref >40)
LDL Cholesterol: 35 mg/dL (ref 0–99)
Total CHOL/HDL Ratio: 2.5 ratio
Triglycerides: 130 mg/dL (ref <150)
VLDL: 26 mg/dL (ref 0–40)

## 2023-08-19 LAB — POCT I-STAT 7, (LYTES, BLD GAS, ICA,H+H)
Acid-Base Excess: 2 mmol/L (ref 0.0–2.0)
Bicarbonate: 26.5 mmol/L (ref 20.0–28.0)
Calcium, Ion: 1.2 mmol/L (ref 1.15–1.40)
HCT: 29 % — ABNORMAL LOW (ref 36.0–46.0)
Hemoglobin: 9.9 g/dL — ABNORMAL LOW (ref 12.0–15.0)
O2 Saturation: 99 %
Patient temperature: 99.5
Potassium: 3.6 mmol/L (ref 3.5–5.1)
Sodium: 135 mmol/L (ref 135–145)
TCO2: 28 mmol/L (ref 22–32)
pCO2 arterial: 40.8 mmHg (ref 32–48)
pH, Arterial: 7.423 (ref 7.35–7.45)
pO2, Arterial: 142 mmHg — ABNORMAL HIGH (ref 83–108)

## 2023-08-19 LAB — CBC
HCT: 32.2 % — ABNORMAL LOW (ref 36.0–46.0)
HCT: 35.8 % — ABNORMAL LOW (ref 36.0–46.0)
Hemoglobin: 10.4 g/dL — ABNORMAL LOW (ref 12.0–15.0)
Hemoglobin: 11.5 g/dL — ABNORMAL LOW (ref 12.0–15.0)
MCH: 32.2 pg (ref 26.0–34.0)
MCH: 32.3 pg (ref 26.0–34.0)
MCHC: 32.1 g/dL (ref 30.0–36.0)
MCHC: 32.3 g/dL (ref 30.0–36.0)
MCV: 100.6 fL — ABNORMAL HIGH (ref 80.0–100.0)
MCV: 99.7 fL (ref 80.0–100.0)
Platelets: 296 10*3/uL (ref 150–400)
Platelets: 332 10*3/uL (ref 150–400)
RBC: 3.23 MIL/uL — ABNORMAL LOW (ref 3.87–5.11)
RBC: 3.56 MIL/uL — ABNORMAL LOW (ref 3.87–5.11)
RDW: 13.4 % (ref 11.5–15.5)
RDW: 13.5 % (ref 11.5–15.5)
WBC: 10.6 10*3/uL — ABNORMAL HIGH (ref 4.0–10.5)
WBC: 9.3 10*3/uL (ref 4.0–10.5)
nRBC: 0 % (ref 0.0–0.2)
nRBC: 0 % (ref 0.0–0.2)

## 2023-08-19 LAB — DIFFERENTIAL
Abs Immature Granulocytes: 0.08 10*3/uL — ABNORMAL HIGH (ref 0.00–0.07)
Basophils Absolute: 0.1 10*3/uL (ref 0.0–0.1)
Basophils Relative: 1 %
Eosinophils Absolute: 0.2 10*3/uL (ref 0.0–0.5)
Eosinophils Relative: 2 %
Immature Granulocytes: 1 %
Lymphocytes Relative: 18 %
Lymphs Abs: 1.7 10*3/uL (ref 0.7–4.0)
Monocytes Absolute: 0.7 10*3/uL (ref 0.1–1.0)
Monocytes Relative: 7 %
Neutro Abs: 6.6 10*3/uL (ref 1.7–7.7)
Neutrophils Relative %: 71 %

## 2023-08-19 LAB — URINALYSIS, ROUTINE W REFLEX MICROSCOPIC
Bilirubin Urine: NEGATIVE
Glucose, UA: NEGATIVE mg/dL
Hgb urine dipstick: NEGATIVE
Ketones, ur: NEGATIVE mg/dL
Leukocytes,Ua: NEGATIVE
Nitrite: NEGATIVE
Protein, ur: NEGATIVE mg/dL
Specific Gravity, Urine: >1.046 — ABNORMAL HIGH (ref 1.005–1.030)
pH: 6 (ref 5.0–8.0)

## 2023-08-19 LAB — BASIC METABOLIC PANEL WITH GFR
Anion gap: 12 (ref 5–15)
BUN: 15 mg/dL (ref 8–23)
CO2: 24 mmol/L (ref 22–32)
Calcium: 8.8 mg/dL — ABNORMAL LOW (ref 8.9–10.3)
Chloride: 101 mmol/L (ref 98–111)
Creatinine, Ser: 0.72 mg/dL (ref 0.44–1.00)
GFR, Estimated: >60 mL/min (ref >60)
Glucose, Bld: 102 mg/dL — ABNORMAL HIGH (ref 70–99)
Potassium: 4.1 mmol/L (ref 3.5–5.1)
Sodium: 137 mmol/L (ref 135–145)

## 2023-08-19 LAB — GLUCOSE, CAPILLARY
Glucose-Capillary: 100 mg/dL — ABNORMAL HIGH (ref 70–99)
Glucose-Capillary: 110 mg/dL — ABNORMAL HIGH (ref 70–99)
Glucose-Capillary: 113 mg/dL — ABNORMAL HIGH (ref 70–99)
Glucose-Capillary: 116 mg/dL — ABNORMAL HIGH (ref 70–99)
Glucose-Capillary: 118 mg/dL — ABNORMAL HIGH (ref 70–99)
Glucose-Capillary: 122 mg/dL — ABNORMAL HIGH (ref 70–99)

## 2023-08-19 LAB — COMPREHENSIVE METABOLIC PANEL WITH GFR
ALT: 14 U/L (ref 0–44)
AST: 20 U/L (ref 15–41)
Albumin: 3.4 g/dL — ABNORMAL LOW (ref 3.5–5.0)
Alkaline Phosphatase: 113 U/L (ref 38–126)
Anion gap: 13 (ref 5–15)
BUN: 21 mg/dL (ref 8–23)
CO2: 24 mmol/L (ref 22–32)
Calcium: 9.2 mg/dL (ref 8.9–10.3)
Chloride: 100 mmol/L (ref 98–111)
Creatinine, Ser: 0.88 mg/dL (ref 0.44–1.00)
GFR, Estimated: >60 mL/min (ref >60)
Glucose, Bld: 122 mg/dL — ABNORMAL HIGH (ref 70–99)
Potassium: 4.2 mmol/L (ref 3.5–5.1)
Sodium: 137 mmol/L (ref 135–145)
Total Bilirubin: 0.8 mg/dL (ref 0.0–1.2)
Total Protein: 6.7 g/dL (ref 6.5–8.1)

## 2023-08-19 LAB — CBG MONITORING, ED: Glucose-Capillary: 106 mg/dL — ABNORMAL HIGH (ref 70–99)

## 2023-08-19 LAB — I-STAT CHEM 8, ED
BUN: 22 mg/dL (ref 8–23)
Calcium, Ion: 1.05 mmol/L — ABNORMAL LOW (ref 1.15–1.40)
Chloride: 100 mmol/L (ref 98–111)
Creatinine, Ser: 0.9 mg/dL (ref 0.44–1.00)
Glucose, Bld: 117 mg/dL — ABNORMAL HIGH (ref 70–99)
HCT: 35 % — ABNORMAL LOW (ref 36.0–46.0)
Hemoglobin: 11.9 g/dL — ABNORMAL LOW (ref 12.0–15.0)
Potassium: 4.2 mmol/L (ref 3.5–5.1)
Sodium: 135 mmol/L (ref 135–145)
TCO2: 26 mmol/L (ref 22–32)

## 2023-08-19 LAB — PROTIME-INR
INR: 1.2 (ref 0.8–1.2)
Prothrombin Time: 15.8 s — ABNORMAL HIGH (ref 11.4–15.2)

## 2023-08-19 LAB — MRSA NEXT GEN BY PCR, NASAL: MRSA by PCR Next Gen: NOT DETECTED

## 2023-08-19 LAB — HEMOGLOBIN A1C
Hgb A1c MFr Bld: 4.3 % — ABNORMAL LOW (ref 4.8–5.6)
Mean Plasma Glucose: 76.71 mg/dL

## 2023-08-19 LAB — MAGNESIUM: Magnesium: 1.9 mg/dL (ref 1.7–2.4)

## 2023-08-19 LAB — APTT: aPTT: 34 s (ref 24–36)

## 2023-08-19 LAB — ETHANOL: Alcohol, Ethyl (B): <15 mg/dL (ref <15)

## 2023-08-19 LAB — PHOSPHORUS: Phosphorus: 3.2 mg/dL (ref 2.5–4.6)

## 2023-08-19 SURGERY — RADIOLOGY WITH ANESTHESIA
Anesthesia: General

## 2023-08-19 MED ORDER — ORAL CARE MOUTH RINSE: 15.0000 mL | OROMUCOSAL | Status: DC | PRN

## 2023-08-19 MED ORDER — CHLORHEXIDINE GLUCONATE CLOTH 2 % EX PADS
6.0000 | MEDICATED_PAD | Freq: Every day | CUTANEOUS | Status: DC
  Administered 2023-08-19 – 2023-08-21 (×3): 6 via TOPICAL

## 2023-08-19 MED ORDER — PHENYLEPHRINE HCL-NACL 20-0.9 MG/250ML-% IV SOLN: 0.0000 ug/min | INTRAVENOUS | Status: DC

## 2023-08-19 MED ORDER — SODIUM CHLORIDE 0.9 % IV SOLN: 250.0000 mL | INTRAVENOUS | Status: AC

## 2023-08-19 MED ORDER — ORAL CARE MOUTH RINSE
15.0000 mL | OROMUCOSAL | Status: DC | PRN
  Administered 2023-08-19: 15 mL via OROMUCOSAL

## 2023-08-19 MED ORDER — PANTOPRAZOLE SODIUM 40 MG IV SOLR
40.0000 mg | Freq: Every day | INTRAVENOUS | Status: DC
  Administered 2023-08-19 – 2023-08-21 (×3): 40 mg via INTRAVENOUS
  Filled 2023-08-19 (×3): qty 10

## 2023-08-19 MED ORDER — CLEVIDIPINE BUTYRATE 0.5 MG/ML IV EMUL: 0.0000 mg/h | INTRAVENOUS | Status: DC

## 2023-08-19 MED ORDER — SODIUM CHLORIDE 0.9 % IV SOLN: INTRAVENOUS | Status: DC

## 2023-08-19 MED ORDER — SUCCINYLCHOLINE CHLORIDE 200 MG/10ML IV SOSY
PREFILLED_SYRINGE | INTRAVENOUS | Status: DC | PRN
  Administered 2023-08-19: 100 mg via INTRAVENOUS

## 2023-08-19 MED ORDER — ORAL CARE MOUTH RINSE
15.0000 mL | OROMUCOSAL | Status: DC
  Administered 2023-08-19 – 2023-08-20 (×5): 15 mL via OROMUCOSAL

## 2023-08-19 MED ORDER — PHENYLEPHRINE HCL-NACL 20-0.9 MG/250ML-% IV SOLN
INTRAVENOUS | Status: DC | PRN
  Administered 2023-08-19: 40 ug/min via INTRAVENOUS

## 2023-08-19 MED ORDER — SODIUM CHLORIDE 0.9% FLUSH
3.0000 mL | Freq: Once | INTRAVENOUS | Status: AC
  Administered 2023-08-19: 3 mL via INTRAVENOUS

## 2023-08-19 MED ORDER — IOHEXOL 300 MG/ML  SOLN
150.0000 mL | Freq: Once | INTRAMUSCULAR | Status: AC | PRN
  Administered 2023-08-19: 50 mL via INTRA_ARTERIAL

## 2023-08-19 MED ORDER — PHENYLEPHRINE HCL-NACL 20-0.9 MG/250ML-% IV SOLN
25.0000 ug/min | INTRAVENOUS | Status: DC
  Administered 2023-08-20: 45 ug/min via INTRAVENOUS
  Filled 2023-08-19 (×3): qty 250

## 2023-08-19 MED ORDER — ORAL CARE MOUTH RINSE
15.0000 mL | OROMUCOSAL | Status: DC
  Administered 2023-08-19 (×6): 15 mL via OROMUCOSAL

## 2023-08-19 MED ORDER — NITROGLYCERIN 1 MG/10 ML FOR IR/CATH LAB
INTRA_ARTERIAL | Status: AC | PRN
  Administered 2023-08-19: 200 ug via INTRA_ARTERIAL

## 2023-08-19 MED ORDER — METOPROLOL TARTRATE 5 MG/5ML IV SOLN
2.5000 mg | INTRAVENOUS | Status: DC | PRN
Start: 2023-08-19 — End: 2023-08-20

## 2023-08-19 MED ORDER — STROKE: EARLY STAGES OF RECOVERY BOOK
Freq: Once | Status: AC
  Filled 2023-08-19: qty 1

## 2023-08-19 MED ORDER — TORSEMIDE 20 MG PO TABS: 20.0000 mg | ORAL_TABLET | Freq: Every day | ORAL | Status: DC

## 2023-08-19 MED ORDER — LIDOCAINE HCL (CARDIAC) PF 100 MG/5ML IV SOSY
PREFILLED_SYRINGE | INTRAVENOUS | Status: DC | PRN
  Administered 2023-08-19: 80 mg via INTRAVENOUS

## 2023-08-19 MED ORDER — PROPOFOL 10 MG/ML IV BOLUS
INTRAVENOUS | Status: DC | PRN
  Administered 2023-08-19: 20 mg via INTRAVENOUS

## 2023-08-19 MED ORDER — ALBUMIN HUMAN 5 % IV SOLN: INTRAVENOUS | Status: DC | PRN

## 2023-08-19 MED ORDER — ACETAMINOPHEN 325 MG PO TABS: 650.0000 mg | ORAL_TABLET | ORAL | Status: DC | PRN

## 2023-08-19 MED ORDER — VENLAFAXINE HCL ER 75 MG PO CP24
75.0000 mg | ORAL_CAPSULE | Freq: Every morning | ORAL | Status: DC
  Administered 2023-08-20: 75 mg via ORAL
  Filled 2023-08-19 (×3): qty 1

## 2023-08-19 MED ORDER — ROCURONIUM BROMIDE 100 MG/10ML IV SOLN
INTRAVENOUS | Status: DC | PRN
  Administered 2023-08-19: 100 mg via INTRAVENOUS

## 2023-08-19 MED ORDER — ONDANSETRON HCL 4 MG/2ML IJ SOLN: 4.0000 mg | Freq: Four times a day (QID) | INTRAMUSCULAR | Status: DC | PRN

## 2023-08-19 MED ORDER — PROPOFOL 1000 MG/100ML IV EMUL
5.0000 ug/kg/min | INTRAVENOUS | Status: DC
  Filled 2023-08-19: qty 100

## 2023-08-19 MED ORDER — PROPOFOL 500 MG/50ML IV EMUL
INTRAVENOUS | Status: DC | PRN
  Administered 2023-08-19: 30 ug/kg/min via INTRAVENOUS

## 2023-08-19 MED ORDER — APIXABAN 5 MG PO TABS: 5.0000 mg | ORAL_TABLET | Freq: Two times a day (BID) | ORAL | Status: DC

## 2023-08-19 MED ORDER — INSULIN ASPART 100 UNIT/ML IJ SOLN
0.0000 [IU] | INTRAMUSCULAR | Status: DC
  Administered 2023-08-19 – 2023-08-21 (×3): 2 [IU] via SUBCUTANEOUS
  Administered 2023-08-21: 3 [IU] via SUBCUTANEOUS
  Administered 2023-08-22 (×3): 2 [IU] via SUBCUTANEOUS

## 2023-08-19 MED ORDER — IOHEXOL 350 MG/ML SOLN
100.0000 mL | Freq: Once | INTRAVENOUS | Status: AC | PRN
  Administered 2023-08-19: 100 mL via INTRAVENOUS

## 2023-08-19 MED ORDER — NITROGLYCERIN 1 MG/10 ML FOR IR/CATH LAB
INTRA_ARTERIAL | Status: AC
Start: 2023-08-19 — End: ?
  Filled 2023-08-19: qty 10

## 2023-08-19 MED ORDER — LACTATED RINGERS IV SOLN: INTRAVENOUS | Status: DC | PRN

## 2023-08-19 MED ORDER — SODIUM CHLORIDE 0.9 % IV BOLUS
250.0000 mL | INTRAVENOUS | Status: AC | PRN
  Administered 2023-08-20: 250 mL via INTRAVENOUS

## 2023-08-19 MED ORDER — ACETAMINOPHEN 160 MG/5ML PO SOLN
650.0000 mg | ORAL | Status: DC | PRN
  Administered 2023-08-20 – 2023-08-21 (×5): 650 mg
  Filled 2023-08-19 (×5): qty 20.3

## 2023-08-19 MED ORDER — ASPIRIN 300 MG RE SUPP: 300.0000 mg | Freq: Every day | RECTAL | Status: DC

## 2023-08-19 MED ORDER — ASPIRIN 325 MG PO TABS: 325.0000 mg | ORAL_TABLET | Freq: Every day | ORAL | Status: DC

## 2023-08-19 MED ORDER — DIGOXIN 125 MCG PO TABS
0.1250 mg | ORAL_TABLET | ORAL | Status: DC
  Filled 2023-08-19: qty 1

## 2023-08-19 MED ORDER — ACETAMINOPHEN 650 MG RE SUPP: 650.0000 mg | RECTAL | Status: DC | PRN

## 2023-08-19 MED ORDER — PHENYLEPHRINE HCL (PRESSORS) 10 MG/ML IV SOLN
INTRAVENOUS | Status: DC | PRN
  Administered 2023-08-19 (×3): 160 ug via INTRAVENOUS

## 2023-08-19 NOTE — Progress Notes (Signed)
 eLink Physician-Brief Progress Note Patient Name: [name] DOB: [birth year] MRN: [id number]   Date of Service  08/19/2023  HPI/Events of Note  Patient admitted to the ICU intubated and vented s/p successful thrombectomy of right MCA LVO thrombus for CVA. Goal SBP is 120-160 per interventionist.  eICU Interventions  New Patient Evaluation.        [name] 08/19/2023, 3:21 AM

## 2023-08-19 NOTE — ED Triage Notes (Signed)
 Pt BIB [facility] from [facility]. Nursing facility found the pt slumped over in her chair with AMS. LKW 2000. EMS called out for stroke like symptoms, left sided facial droop, left arm weakness, and left sided neglect. Facility unknown of any recent falls, pt is on blood thinners for Afib. A&Ox4 at baseline, mobility status is unknown.   EMS  140/90 BP Afib 70-130HR 131 CBG 18 L Hand

## 2023-08-19 NOTE — Progress Notes (Signed)
 NAME:  [name], MRN:  [id number], DOB:  30-May-1930, LOS: 0 ADMISSION DATE:  08/19/2023, CONSULTATION DATE:  08/19/2023 REFERRING MD:  Romilda Coaster, MD, CHIEF COMPLAINT:  CVA  History of Present Illness:  89 y/o female with PMH for atrial fibrillation on Eliquis , non-ischemic cardiomyopathy, LBBB, chronic HFrEF, depression, and anxiety, now presenting with right wrist injury after a fall. Found to have a R distal radius fracture s/p ORIF 08/09/2023 who was d/c from [facility] 5/2. Comes in tonight with Code stroke.  She presents via EMS from NH found to Patient was found slumped over in her chair with last known well at 8 PM. EMS was called for strokelike symptoms and noted left-sided facial droop, left-sided arm weakness, left-sided neglect. Facility did not acknowledge any known falls. The patient does take Eliquis  due to atrial fibrillation. She recently had ORIF at the end of April for a distal right side radius fracture. She was in rehab due to this fracture. The patient normally lives at home with family. She is independent, cooks her own meals, and is ambulatory at baseline.  In ED her neuro findings were," Patient with decreased LOC, right gaze preference , left hemianopia, left facial droop, left arm weakness, left leg weakness, left decreased sensation, Expressive aphasia , dysarthria , and left neglect.    NIHSS 19 " She was taken to IR and underwent a Right M1 Thrombectomy, EBL 200cc. Per IR note,"Right M1 occlusion (pre TICI=0). Successful thrombectomy.  Residual chronic right M1 stenosis (<50%). Proximal M2 stenosis >90%. Basal ganglia contrast staining on [facility] beam CT. TICI Score: 2b   Length of Bedrest: 2 hours from NIR standpoint BP goal next 24 hours: SBP 120-160   Sheath Closure: Angioseal Disposition: To ICU, intubated due to medical complexity"  Pertinent  Medical History  Anemia, Anxiety, Arthritis, Atrial fibrillation with RVR (HCC) (06/18/2015), Cardiogenic shock  (05/02/2017), Complication of anesthesia (2010), Depression, Diverticulosis, DJD (degenerative joint disease) of knee, Dyspnea, E coli infection, GERD (gastroesophageal reflux disease), History of blood transfusion, History of Clostridium difficile colitis, IBS (irritable bowel syndrome), Insomnia, LBBB (left bundle branch block), Lymphocytic colitis (09/27/2016), Moderate to severe pulmonary hypertension (HCC) (11/26/2019), Nonischemic cardiomyopathy (HCC), Osteoporosis, Positive PPD, Sciatica, and Urinary incontinence.   Significant Hospital Events: Including procedures, antibiotic start and stop dates in addition to other pertinent events   5/10 s/p Right M1 Thrombectomy, admitted to Neuro-ICU intubated and sedated  Interim History / Subjective:  Remains lightly sedated on ventilator this a.m. opens eyes to verbal command  Objective    Blood pressure 125/68, pulse 83, temperature 98.5 F (36.9 C), temperature source Axillary, resp. rate 17, height 5\' 6"  (1.676 m), weight 73 kg, SpO2 100%.    Vent Mode: PRVC FiO2 (%):  [30 %-40 %] 30 % Set Rate:  [16 bmp] 16 bmp Vt Set:  [470 mL] 470 mL PEEP:  [5 cmH20] 5 cmH20 Plateau Pressure:  [16 cmH20-18 cmH20] 16 cmH20   Intake/Output Summary (Last 24 hours) at 08/19/2023 0951 Last data filed at 08/19/2023 0800 Gross per 24 hour  Intake 1567.91 ml  Output 100 ml  Net 1467.91 ml   Filed Weights   08/19/23 0000 08/19/23 0049 08/19/23 0345  Weight: 70.4 kg 70.4 kg 73 kg    Examination: General: Acute on chronic elderly female lying in bed on mechanical ventilation in no acute distress HEENT: ETT, MM pink/moist, PERRL,  Neuro: Sedated on ventilator, opens eyes to verbal command CV: s1s2 regular rate and rhythm, no murmur,  rubs, or gallops,  PULM: Clear to auscultation bilaterally, no increased work of breathing, no added breath sounds GI: soft, bowel sounds active in all 4 quadrants, non-tender, non-distended, tolerating TF Extremities:  warm/dry, no edema  Skin: Right arm wrapped with ace wrap  Resolved Hospital Problem list   N/a  Assessment & Plan:  CVA s/p right M1 thrombectomy -S/p IR intervention with Rt M1 thrombectomy P: Neurology following, appreciate assistance Neuro protective measures Pending MRI, neurology wishes to continue intubation to maintain airway during MRI Secondary stroke prevention If no hemorrhagic conversion seen on imaging plan to resume home Eliquis  later today SBP goal 120-160, currently on low-dose neo drip  Expected postprocedural orders support History of pulmonary hypertension P: Remain intubated to secure airway during MRI, hopeful for extubation post Continue ventilator support with lung protective strategies  Wean PEEP and FiO2 for sats greater than 90%. Head of bed elevated 30 degrees. Plateau pressures less than 30 cm H20.  Follow intermittent chest x-ray and ABG.   SAT/SBT as tolerated, mentation preclude extubation  Ensure adequate pulmonary hygiene  Follow cultures  VAP bundle in place  PAD protocol  Congestive Heart Failure -last echo from 2024 EF 25-30% and global hypokinesis Permanent atrial fibrillation anticoagulated with Eliquis  at baseline - Home medications include digoxin , metoprolol , and Demadex  P: Follow repeat echo Continuous telemetry Defer resumption of Eliquis  as above Continue digoxin  and beta-blocker Hold home Demadex  Strict intake and output Daily weight Optimize electrolytes    Best Practice (right click and "Reselect all SmartList Selections" daily)   Diet/type: NPO w/ meds via tube DVT prophylaxis DOAC Pressure ulcer(s): N/A GI prophylaxis: H2B Lines: N/A Foley:  N/A Code Status:  limited  Critical care time:   CRITICAL CARE Performed by: Deaunna Olarte D. Harris  Total critical care time: 38 minutes  Critical care time was exclusive of separately billable procedures and treating other patients.  Critical care was necessary to  treat or prevent imminent or life-threatening deterioration.  Critical care was time spent personally by me on the following activities: development of treatment plan with patient and/or surrogate as well as nursing, discussions with consultants, evaluation of patient's response to treatment, examination of patient, obtaining history from patient or surrogate, ordering and performing treatments and interventions, ordering and review of laboratory studies, ordering and review of radiographic studies, pulse oximetry and re-evaluation of patient's condition.  Jeaninne Lodico D. Harris, NP-C [facility] Personal contact information can be found on [employer]  If no contact or response made please call [phone] 08/19/2023, 10:43 AM

## 2023-08-19 NOTE — IPAL (Signed)
  Interdisciplinary Goals of Care Family Meeting   Date carried out: 08/19/2023  Location of the meeting: Bedside  Member's involved: Bedside Registered Nurse and Family Member or next of kin  Durable Power of Attorney or acting medical decision maker: Daughter, Caryl Clas     Discussion: Daily update held with family this a.m. regarding overnight events and expected progression.  Caryl Clas informs me that Amarea has a longstanding DNR and would not want aggressive interventions, she states that she would not even disagree with current intubation.  With that information we discussed her ongoing plan of care.  Decision was made to obtain MRI and post imaging stop propofol  infusion to determine mentation.    This plan was carried out and Taifa is now following simple commands and is arousable on ventilator.  She is meeting criteria for extubation but mentation is still slightly decreased.  We discussed potential risk associated with early extubation and Caryl Clas stated she understood and would like to proceed with extubation.  If Allysin fails extubation family does not want her reintubated and would likely transition to comfort care at that time.  Code status:   Code Status: Do not attempt resuscitation (DNR) PRE-ARREST INTERVENTIONS DESIRED   Disposition: Continue current acute care  Time spent for the meeting: 40 mins   Kalsey Lull D. Harris, NP-C [facility] Personal contact information can be found on Amion  If no contact or response made please call [phone] 08/19/2023, 2:37 PM

## 2023-08-19 NOTE — Progress Notes (Signed)
 OT Cancellation Note  Patient Details Name: [name] MRN: [id number] DOB: [birth year]   Cancelled Treatment:    Reason Eval/Treat Not Completed: Medical issues which prohibited therapy  Per RN, plans to extubate today and hold eval until tomorrow.   Karilyn Ouch, OTR/L [facility] Acute Rehabilitation Office: [phone]  [name] 08/19/2023, 2:36 PM

## 2023-08-19 NOTE — Procedures (Signed)
 Extubation Procedure Note  Patient Details:   Name: [name] DOB: [birth year] MRN: [id number]   Airway Documentation:    Vent end date: 08/19/23 Vent end time: 1450   Evaluation  O2 sats: stable throughout Complications: No apparent complications Patient did tolerate procedure well. Bilateral Breath Sounds: Diminished        Patient extubated per MD's written order with RT and RN at bedside. Cuff leak present prior to extubation, no stridor noted post. Patient placed on 3L [place] and tolerating well at this time, vitals stable.    Yes  Michai Dieppa Mirian Ames 08/19/2023, 2:57 PM

## 2023-08-19 NOTE — Transfer of Care (Signed)
 Immediate Anesthesia Transfer of Care Note  Patient: [name]  Procedure(s) Performed: RADIOLOGY WITH ANESTHESIA  Patient Location: ICU  Anesthesia Type:General  Level of Consciousness: Patient remains intubated per anesthesia plan  Airway & Oxygen Therapy: Patient remains intubated per anesthesia plan and Patient placed on Ventilator (see vital sign flow sheet for setting)  Post-op Assessment: Report given to RN and Post -op Vital signs reviewed and stable  Post vital signs: Reviewed and stable  Last Vitals:  Vitals Value Taken Time  BP 149/77 08/19/23 0315  Temp    Pulse 96 08/19/23 0316  Resp 21 08/19/23 0316  SpO2 99 % 08/19/23 0316  Vitals shown include unfiled device data.  Last Pain:  Vitals:   08/19/23 0048  TempSrc:   PainSc: 0-No pain         Complications: No notable events documented.

## 2023-08-19 NOTE — Progress Notes (Signed)
 PT Cancellation Note  Patient Details Name: JANELLYS ZUCH MRN: [id number] DOB: 01/23/1931   Cancelled Treatment:    Reason Eval/Treat Not Completed: Medical issues which prohibited therapy this afternoon. Per RN, planning to extubate today, will check on tomorrow for PT evaluation.   Barnabas Booth, PT, DPT   Acute Rehabilitation Department Office [phone] Secure Chat Communication Preferred   Lona Rist 08/19/2023, 2:26 PM

## 2023-08-19 NOTE — Procedures (Addendum)
 NIR Procedure Note  Preop Dx: Acute ischemic stroke Post Dx: Same  Procedure: Right M1 thrombectomy Operator: Reagan Camera MD  EBL: 200 mL Complications: No immediate  Findings: Right M1 occlusion (pre TICI=0). Successful thrombectomy.  Residual chronic right M1 stenosis (<50%). Proximal M2 stenosis >90%. Basal ganglia contrast staining on [facility] beam CT. TICI Score: 2b  Length of Bedrest: 2 hours from NIR standpoint BP goal next 24 hours: SBP 120-160  Sheath Closure: Angioseal Disposition: To ICU, intubated due to medical complexity  Please call with questions, concerns, or change in patient condition  Reagan Camera MD  Attending Addendum: Post Images reviewed with Dr. Alecia Ames. Case discussed with ICU physician.  Family updated.

## 2023-08-19 NOTE — Anesthesia Procedure Notes (Signed)
 Procedure Name: Intubation Date/Time: 08/19/2023 1:37 AM  Performed by: Varie Machamer T, CRNAPre-anesthesia Checklist: Patient identified, Emergency Drugs available, Suction available and Patient being monitored Patient Re-evaluated:Patient Re-evaluated prior to induction Oxygen Delivery Method: Circle system utilized Preoxygenation: Pre-oxygenation with 100% oxygen Induction Type: IV induction, Rapid sequence and Cricoid Pressure applied Ventilation: Mask ventilation without difficulty Laryngoscope Size: Mac and 3 Grade View: Grade I Tube type: Oral Tube size: 7.5 mm Number of attempts: 1 Airway Equipment and Method: Stylet and Oral airway Placement Confirmation: ETT inserted through vocal cords under direct vision, positive ETCO2 and breath sounds checked- equal and bilateral Secured at: 22 cm Tube secured with: Tape Dental Injury: Teeth and Oropharynx as per pre-operative assessment

## 2023-08-19 NOTE — Progress Notes (Addendum)
 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  Some hemorrhagic transformation seen on MRI.  Patient extubated later in the afternoon.  INTERIM HISTORY/SUBJECTIVE  Patient required low-dose phenylephrine  overnight to maintain blood pressure within parameters but has now been weaned off of it.  She has remained afebrile.  She was extubated later in the afternoon.  OBJECTIVE  CBC    Component Value Date/Time   WBC 10.6 (H) 08/19/2023 0912   RBC 3.23 (L) 08/19/2023 0912   HGB 10.4 (L) 08/19/2023 0912   HGB 12.5 02/05/2018 0937   HCT 32.2 (L) 08/19/2023 0912   HCT 38.4 02/05/2018 0937   PLT 296 08/19/2023 0912   PLT 320 02/05/2018 0937   MCV 99.7 08/19/2023 0912   MCV 92 02/05/2018 0937   MCH 32.2 08/19/2023 0912   MCHC 32.3 08/19/2023 0912   RDW 13.5 08/19/2023 0912   RDW 12.3 02/05/2018 0937   LYMPHSABS 1.7 08/19/2023 0014   LYMPHSABS 1.7 02/05/2018 0937   MONOABS 0.7 08/19/2023 0014   EOSABS 0.2 08/19/2023 0014   EOSABS 0.2 02/05/2018 0937   BASOSABS 0.1 08/19/2023 0014   BASOSABS 0.1 02/05/2018 0937    BMET    Component Value Date/Time   NA 137 08/19/2023 0912   NA 136 05/12/2022 1159   K 4.1 08/19/2023 0912   CL 101 08/19/2023 0912   CO2 24 08/19/2023 0912   GLUCOSE 102 (H) 08/19/2023 0912   BUN 15 08/19/2023 0912   BUN 20 05/12/2022 1159   CREATININE 0.72 08/19/2023 0912   CREATININE 0.83 07/07/2022 1540   CALCIUM  8.8 (L) 08/19/2023 0912   EGFR 75 05/12/2022 1159   GFRNONAA >60 08/19/2023 0912    IMAGING past 24 hours MR BRAIN WO CONTRAST Result Date: 08/19/2023 CLINICAL DATA:  Stroke follow-up. EXAM: MRI HEAD WITHOUT CONTRAST TECHNIQUE: Multiplanar, multiecho pulse sequences of the brain and surrounding structures were obtained without intravenous contrast.  COMPARISON:  CT head and CTA head and neck earlier same day. FINDINGS: Brain: Restricted diffusion within the right temporal lobe compatible with acute infarct. Additional restricted diffusion involving the right basal ganglia particularly the caudate and lentiform nuclei as well as the anterior limb of the internal capsule. There is extension of infarct into the right corona radiata and centrum semiovale possible additional acute cortical infarct within the anterior right frontal lobe. Punctate acute infarct in the right cerebellum. There is edema throughout the region of infarct particularly within the caudate and lentiform nuclei with prominent associated susceptibility concerning for interval development of hemorrhagic conversion. Additional susceptibility in the anterior inferior right temporal lobe. Scattered and confluent T2/FLAIR hyperintensity suggestive of moderate chronic microvascular ischemic changes. Remote lacunar infarcts in the bilateral thalami. Mild parenchymal volume loss. Normal appearance of midline structures. The basilar cisterns are patent. No extra-axial fluid collections. Ventricles: Partial effacement of the body and frontal horn of the right lateral ventricle. No hydrocephalus. Vascular: Skull base flow voids are visualized. Skull and upper cervical spine: No focal abnormality. Sinuses/Orbits: Orbits are symmetric. Paranasal sinuses are clear. Other: Mastoid air cells are clear. Partially visualized endotracheal tube. IMPRESSION: Acute right MCA territory infarct primarily involving the right temporal lobe and right basal ganglia. Prominent susceptibility in the caudate and lentiform nuclei with associated edema concerning for development of hemorrhagic conversion. Recommend short interval follow-up head CT. Additional foci of  susceptibility in the anterior inferior right temporal lobe which may reflect petechial hemorrhage versus hemorrhagic conversion. No midline shift. Moderate  chronic microvascular ischemic changes. Remote infarcts in the bilateral thalami. Generalized parenchymal volume loss. Electronically Signed   By: Denny Flack M.D.   On: 08/19/2023 13:22   IR PERCUTANEOUS ART THROMBECTOMY/INFUSION INTRACRANIAL INC DIAG ANGIO Result Date: 08/19/2023 PROCEDURE PERFORMED: 1. Stroke thrombectomy 2. Ultrasound vascular access 3. [facility] beam CT for treatment planning COMPARISON:  CT perfusion performed Aug 19, 2023. CLINICAL DATA:  89 year old female presents for evaluation and treatment of an acute ischemic stroke with primary symptoms of left-sided weakness and difficulty swallowing with facial droop. Approximately 1 week prior to presentation, the patient was in her normal state of health. In the last week, she has experienced a fall with fracture of the right upper extremity which was treated with surgical therapy. The patient has a history of atrial fibrillation treated with anticoagulation. Imaging evaluation demonstrated a right M1 occlusion with large area at risk. The patient was not a candidate for TNK. After careful review of clinical history and discussion of life goals, the patient and her family were offered emergency therapy with stroke thrombectomy. INDICATION: Acute ischemic stroke. ANESTHESIA/SEDATION: General anesthesia was utilized for the procedure. CONTRAST:  Approximately 50 cc of Omnipaque  300 MEDICATIONS: See anesthesia record. FLUOROSCOPY TIME:  Fluoroscopy Time: 18 minutes and 36 seconds (670 mGy). COMPLICATIONS: None immediate. BODY OF REPORT: Following a full explanation of the procedure along with the potential associated complications, an informed witnessed consent was obtained. The patient was then placed under general anesthesia by the Department of Anesthesiology at [facility]. The right groin was prepped and draped in the usual sterile fashion. Ultrasound was used to study the right common femoral artery which was patent. Using real-time  ultrasound guidance, a 19 gauge introducer needle was used to access the right common femoral artery. Access was performed at 0143. A hard copy image ultrasound the same date uncertain PACS. Using this access, a 6 French sheath was placed in the descending thoracic aorta. Next, selective catheterization the right internal carotid artery was performed. This was slightly challenging secondary to tortuous aortic arch anatomy. A selective arteriogram was performed which demonstrated that the internal carotid artery was patent. A right M1 occlusion was present with no significant perfusion provided to the MCA territory through the primary obstruction (TICI-0). Next, a penumbra 43 and red 68 catheter was used to selectively catheterize the right M1 segment. Selective catheterization was achieved at 0216. Suction thrombectomy was performed at this site which yielded successful extraction of the embolus at at that time. Recanalization was achieved at this time. Evaluation of the angiogram revealed a proximal M2 stenosis which was favored to be chronic as well as a more irregular M1 stenosis which was of uncertain etiology. In order to ascertain as to whether or not this was a chronic finding, a second pass was performed with the suction thrombectomy system at 02:26 in the M1 segment. Additionally, approximately 200 mcg of nitroglycerin  were administered to the MCA territory. A repeat arteriogram demonstrated that the proximal M1 stenosis was likely chronic and estimated at less than 50%. The proximal M2 stenosis which was best profiled in oblique view was estimated at greater than 90%. The imaging morphology of this finding overwhelmingly favors a chronic appearance. After reviewing the imaging I elected to terminate the procedure at this point. Evaluation of the right femoral access site demonstrated at this site was suitable for closure  device. A 6 French Angio-Seal device was deployed without complication. Become beam CT  was then performed to evaluate for intracranial hemorrhage and treatment planning. This demonstrated evidence of contrast staining within the right basal ganglia and lentiform nucleus. There was no clear evidence of subarachnoid hemorrhage. The patient was subsequently transferred to the ICU while intubated secondary to complicated medical condition. IMPRESSION: 1. Suction thrombectomy of right M1. 2. Mild residual stenosis in the right M1 segment favored to be secondary to chronic plaque, estimated less than 50% 3. 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 PLAN: 1. To ICU for routine postoperative supportive care. Electronically Signed   By: Reagan Camera M.D.   On: 08/19/2023 12:00   IR US  Guide Vasc Access Right Result Date: 08/19/2023 PROCEDURE PERFORMED: 1. Stroke thrombectomy 2. Ultrasound vascular access 3. Cone beam CT for treatment planning COMPARISON:  CT perfusion performed Aug 19, 2023. CLINICAL DATA:  89 year old female presents for evaluation and treatment of an acute ischemic stroke with primary symptoms of left-sided weakness and difficulty swallowing with facial droop. Approximately 1 week prior to presentation, the patient was in her normal state of health. In the last week, she has experienced a fall with fracture of the right upper extremity which was treated with surgical therapy. The patient has a history of atrial fibrillation treated with anticoagulation. Imaging evaluation demonstrated a right M1 occlusion with large area at risk. The patient was not a candidate for TNK. After careful review of clinical history and discussion of life goals, the patient and her family were offered emergency therapy with stroke thrombectomy. INDICATION: Acute ischemic stroke. ANESTHESIA/SEDATION: General anesthesia was utilized for the procedure. CONTRAST:  Approximately 50 cc of Omnipaque  300 MEDICATIONS: See anesthesia record. FLUOROSCOPY TIME:  Fluoroscopy Time: 18 minutes and  36 seconds (670 mGy). COMPLICATIONS: None immediate. BODY OF REPORT: Following a full explanation of the procedure along with the potential associated complications, an informed witnessed consent was obtained. The patient was then placed under general anesthesia by the Department of Anesthesiology at [facility]. The right groin was prepped and draped in the usual sterile fashion. Ultrasound was used to study the right common femoral artery which was patent. Using real-time ultrasound guidance, a 19 gauge introducer needle was used to access the right common femoral artery. Access was performed at 0143. A hard copy image ultrasound the same date uncertain PACS. Using this access, a 6 French sheath was placed in the descending thoracic aorta. Next, selective catheterization the right internal carotid artery was performed. This was slightly challenging secondary to tortuous aortic arch anatomy. A selective arteriogram was performed which demonstrated that the internal carotid artery was patent. A right M1 occlusion was present with no significant perfusion provided to the MCA territory through the primary obstruction (TICI-0). Next, a penumbra 43 and red 68 catheter was used to selectively catheterize the right M1 segment. Selective catheterization was achieved at 0216. Suction thrombectomy was performed at this site which yielded successful extraction of the embolus at at that time. Recanalization was achieved at this time. Evaluation of the angiogram revealed a proximal M2 stenosis which was favored to be chronic as well as a more irregular M1 stenosis which was of uncertain etiology. In order to ascertain as to whether or not this was a chronic finding, a second pass was performed with the suction thrombectomy system at 02:26 in the M1 segment. Additionally, approximately 200 mcg of nitroglycerin  were administered to the MCA territory. A  repeat arteriogram demonstrated that the proximal M1 stenosis was  likely chronic and estimated at less than 50%. The proximal M2 stenosis which was best profiled in oblique view was estimated at greater than 90%. The imaging morphology of this finding overwhelmingly favors a chronic appearance. After reviewing the imaging I elected to terminate the procedure at this point. Evaluation of the right femoral access site demonstrated at this site was suitable for closure device. A 6 French Angio-Seal device was deployed without complication. Become beam CT was then performed to evaluate for intracranial hemorrhage and treatment planning. This demonstrated evidence of contrast staining within the right basal ganglia and lentiform nucleus. There was no clear evidence of subarachnoid hemorrhage. The patient was subsequently transferred to the ICU while intubated secondary to complicated medical condition. IMPRESSION: 1. Suction thrombectomy of right M1. 2. Mild residual stenosis in the right M1 segment favored to be secondary to chronic plaque, estimated less than 50% 3. 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 PLAN: 1. To ICU for routine postoperative supportive care. Electronically Signed   By: Reagan Camera M.D.   On: 08/19/2023 12:00   IR CT Head Ltd Result Date: 08/19/2023 PROCEDURE PERFORMED: 1. Stroke thrombectomy 2. Ultrasound vascular access 3. Cone beam CT for treatment planning COMPARISON:  CT perfusion performed Aug 19, 2023. CLINICAL DATA:  89 year old female presents for evaluation and treatment of an acute ischemic stroke with primary symptoms of left-sided weakness and difficulty swallowing with facial droop. Approximately 1 week prior to presentation, the patient was in her normal state of health. In the last week, she has experienced a fall with fracture of the right upper extremity which was treated with surgical therapy. The patient has a history of atrial fibrillation treated with anticoagulation. Imaging evaluation demonstrated a right  M1 occlusion with large area at risk. The patient was not a candidate for TNK. After careful review of clinical history and discussion of life goals, the patient and her family were offered emergency therapy with stroke thrombectomy. INDICATION: Acute ischemic stroke. ANESTHESIA/SEDATION: General anesthesia was utilized for the procedure. CONTRAST:  Approximately 50 cc of Omnipaque  300 MEDICATIONS: See anesthesia record. FLUOROSCOPY TIME:  Fluoroscopy Time: 18 minutes and 36 seconds (670 mGy). COMPLICATIONS: None immediate. BODY OF REPORT: Following a full explanation of the procedure along with the potential associated complications, an informed witnessed consent was obtained. The patient was then placed under general anesthesia by the Department of Anesthesiology at [facility]. The right groin was prepped and draped in the usual sterile fashion. Ultrasound was used to study the right common femoral artery which was patent. Using real-time ultrasound guidance, a 19 gauge introducer needle was used to access the right common femoral artery. Access was performed at 0143. A hard copy image ultrasound the same date uncertain PACS. Using this access, a 6 French sheath was placed in the descending thoracic aorta. Next, selective catheterization the right internal carotid artery was performed. This was slightly challenging secondary to tortuous aortic arch anatomy. A selective arteriogram was performed which demonstrated that the internal carotid artery was patent. A right M1 occlusion was present with no significant perfusion provided to the MCA territory through the primary obstruction (TICI-0). Next, a penumbra 43 and red 68 catheter was used to selectively catheterize the right M1 segment. Selective catheterization was achieved at 0216. Suction thrombectomy was performed at this site which yielded successful extraction of the embolus at at that time. Recanalization was achieved at this time.  Evaluation of the  angiogram revealed a proximal M2 stenosis which was favored to be chronic as well as a more irregular M1 stenosis which was of uncertain etiology. In order to ascertain as to whether or not this was a chronic finding, a second pass was performed with the suction thrombectomy system at 02:26 in the M1 segment. Additionally, approximately 200 mcg of nitroglycerin  were administered to the MCA territory. A repeat arteriogram demonstrated that the proximal M1 stenosis was likely chronic and estimated at less than 50%. The proximal M2 stenosis which was best profiled in oblique view was estimated at greater than 90%. The imaging morphology of this finding overwhelmingly favors a chronic appearance. After reviewing the imaging I elected to terminate the procedure at this point. Evaluation of the right femoral access site demonstrated at this site was suitable for closure device. A 6 French Angio-Seal device was deployed without complication. Become beam CT was then performed to evaluate for intracranial hemorrhage and treatment planning. This demonstrated evidence of contrast staining within the right basal ganglia and lentiform nucleus. There was no clear evidence of subarachnoid hemorrhage. The patient was subsequently transferred to the ICU while intubated secondary to complicated medical condition. IMPRESSION: 1. Suction thrombectomy of right M1. 2. Mild residual stenosis in the right M1 segment favored to be secondary to chronic plaque, estimated less than 50% 3. 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 PLAN: 1. To ICU for routine postoperative supportive care. Electronically Signed   By: Reagan Camera M.D.   On: 08/19/2023 12:00   CT ANGIO HEAD NECK W WO CM W PERF (CODE STROKE) Result Date: 08/19/2023 CLINICAL DATA:  Initial evaluation for acute neuro deficit, stroke suspected. EXAM: CT ANGIOGRAPHY HEAD AND NECK CT PERFUSION BRAIN TECHNIQUE: Multidetector CT imaging of the head and  neck was performed using the standard protocol during bolus administration of intravenous contrast. Multiplanar CT image reconstructions and MIPs were obtained to evaluate the vascular anatomy. Carotid stenosis measurements (when applicable) are obtained utilizing NASCET criteria, using the distal internal carotid diameter as the denominator. Multiphase CT imaging of the brain was performed following IV bolus contrast injection. Subsequent parametric perfusion maps were calculated using RAPID software. RADIATION DOSE REDUCTION: This exam was performed according to the departmental dose-optimization program which includes automated exposure control, adjustment of the mA and/or kV according to patient size and/or use of iterative reconstruction technique. CONTRAST:  OMNIPAQUE  IOHEXOL  350 MG/ML SOLN COMPARISON:  CT from earlier the same day. FINDINGS: CTA NECK FINDINGS Aortic arch: Standard branching. Imaged portion shows no evidence of aneurysm or dissection. No significant stenosis of the major arch vessel origins. Right carotid system: Right common and internal carotid arteries are tortuous but patent without dissection. Mild atheromatous plaque about the right carotid bulb without stenosis. Left carotid system: Left common and internal carotid arteries are tortuous but patent without dissection. Mild atheromatous change about the left carotid bulb without stenosis. Vertebral arteries: Both vertebral arteries patent without stenosis or dissection. Left vertebral artery dominant. Skeleton: No worrisome osseous lesions. Moderate cervical spondylosis at C4-5 through C6-7. Upper thoracic chronic compression deformities noted. Other neck: No other acute finding. Upper chest: Scattered atelectasis and air trapping noted within the visualized lungs. No other acute finding. Review of the MIP images confirms the above findings CTA HEAD FINDINGS Anterior circulation: Both internal carotid arteries are patent through the  siphons without significant stenosis. A1 segments, anterior communicating artery complex common anterior cerebral arteries patent without stenosis.  Left M1 segment and its distal branches are patent and well perfused. There is acute occlusion of the right MCA at the proximal right M1 segment. Scant collateral flow noted within the right MCA distribution distally. Posterior circulation: Both V4 segments patent without stenosis. Both PICA patent at their origins. Basilar patent without stenosis. Superior cerebral arteries patent bilaterally. Left PCA supplied via the basilar. Right PCA supplied via a hypoplastic right P1 segment and prominent right posterior communicating artery. Both PCAs remain patent without stenosis. Venous sinuses: Not well assessed due to timing of the contrast bolus. Anatomic variants: As above.  No aneurysm. Review of the MIP images confirms the above findings CT Brain Perfusion Findings: ASPECTS: 10 CBF (<30%) Volume: 35mL Perfusion (Tmax>6.0s) volume: Mismatch Volume: 92mL Infarction Location:Acute core infarct involving the right frontotemporal region. 92 mL surrounding ischemic penumbra. Findings in keeping with the acute right M1 occlusion. IMPRESSION: 1. Positive CTA for emergent large vessel occlusion, with acute occlusion of the right MCA at the proximal right M1 segment. Scant collateral flow within the right MCA distribution distally. 2. 35 mL acute core infarct involving the right frontotemporal region with 92 mL surrounding ischemic penumbra. 3. Mild for age atheromatous disease elsewhere about the major arterial vasculature of the head and neck. No other hemodynamically significant or correctable stenosis. 4.  Aortic Atherosclerosis (ICD10-I70.0). These results were communicated to Dr. Alecia Ames at 12:45 a.m. on 08/19/2023 by text page via the [facility] messaging system. Electronically Signed   By: Virgia Griffins M.D.   On: 08/19/2023 01:24   CT HEAD CODE STROKE WO  CONTRAST Result Date: 08/19/2023 CLINICAL DATA:  Code stroke. Initial evaluation for acute neuro deficit, stroke suspected. EXAM: CT HEAD WITHOUT CONTRAST TECHNIQUE: Contiguous axial images were obtained from the base of the skull through the vertex without intravenous contrast. RADIATION DOSE REDUCTION: This exam was performed according to the departmental dose-optimization program which includes automated exposure control, adjustment of the mA and/or kV according to patient size and/or use of iterative reconstruction technique. COMPARISON:  Prior study from 08/06/2023 FINDINGS: Brain: Generalized cerebral atrophy with fairly advanced chronic microvascular ischemic disease. Remote lacunar infarct at the right thalamus. No acute intracranial hemorrhage. No acute large vessel territory infarct. No mass lesion or midline shift. No hydrocephalus or extra-axial fluid collection. Vascular: No abnormal hyperdense vessel. Scattered vascular calcifications noted within the carotid siphons. Skull: Scalp soft tissues within normal limits.  Calvarium intact. Sinuses/Orbits: Globes and orbital soft tissues within normal limits. Paranasal sinuses and mastoid air cells are clear. Other: None. ASPECTS [facility] Stroke Program Early CT Score) - Ganglionic level infarction (caudate, lentiform nuclei, internal capsule, insula, M1-M3 cortex): 7 - Supraganglionic infarction (M4-M6 cortex): 3 Total score (0-10 with 10 being normal): 10 IMPRESSION: 1. No acute intracranial abnormality. 2. ASPECTS is 10. 3. Age-related cerebral atrophy with advanced chronic microvascular ischemic disease. Remote right thalamic lacunar infarct. These results were communicated to Dr. Alecia Ames at 12:31 am on 08/19/2023 by text page via the [facility] messaging system. Electronically Signed   By: Virgia Griffins M.D.   On: 08/19/2023 00:32    Vitals:   08/19/23 1430 08/19/23 1445 08/19/23 1500 08/19/23 1515  BP: (!) 142/71 118/70 112/64 130/71  Pulse:  (!) 103 (!) 109 95 95  Resp: (!) 23 (!) 24 20 (!) 23  Temp:      TempSrc:      SpO2: 99% 98% 97% 95%  Weight:      Height:  PHYSICAL EXAM General: Intubated elderly patient in no acute distress, cast on right upper extremity CV: Regular rate and rhythm on monitor Respiratory: Respirations synchronous with ventilator  NEURO (on sedation with low-dose propofol ): Patient rests with eyes closed, pupils equal round and reactive to light, oculocephalic reflex absent, will grimace to sternal rub, face appears symmetrical within limits of assessment with ET tube, briskly localizes noxious stimuli with right upper extremity, does not move left upper extremity and withdraws bilateral lower extremities to noxious  ASSESSMENT/PLAN  [name] is a 89 y.o. female with history of A-fib on Eliquis  and recent fall with right arm fracture admitted for acute onset left-sided weakness.  She was found to have a right MCA occlusion and was taken to interventional radiology for successful mechanical thrombectomy.  Follow-up MRI did demonstrate some hemorrhagic transformation, and Eliquis  was held.  Will obtain follow-up CT tomorrow morning and then discuss reinitiation of anticoagulation.  Patient was extubated in the afternoon of 5/10.  NIH on Admission 17  Acute Ischemic Infarct:  right MCA infarct s/p thrombectomy Right MA occlusion. Etiology: Likely cardioembolic in the setting of A-fib on Eliquis  Code Stroke CT head No acute abnormality. Small vessel disease. Atrophy. ASPECTS 10.    CTA head & neck acute occlusion of right MCA proximal M1 segment CT perfusion 35 mL right sided ischemic core with 92 mL penumbra MRI acute right MCA territory infarct primarily involving right temporal lobe and right basal ganglia, prominent susceptibility in caudate and lentiform nucleus concerning for development of hemorrhagic conversion, additional foci of susceptibility in anterior inferior right temporal  lobe which may also reflect hemorrhagic conversion Follow-up CT 5/11: Pending 2D Echo pending LDL 35 HgbA1c 4.3 VTE prophylaxis -SCDs Eliquis  (apixaban ) daily prior to admission, now on No antithrombotic secondary to hemorrhagic transformation Therapy recommendations:  Pending Disposition: pending  Atrial fibrillation Home Meds: Eliquis  2.5 mg twice daily, metoprolol  XL 200 mg daily, digoxin  0.125 mg Monday Wednesday Friday Continue telemetry monitoring Anticoagulation held given hemorrhagic transformation, will follow-up with head CT 5/11  History of hypertension, hypotensive overnight Home meds: None Unstable, requiring phenylephrine  Blood Pressure Goal: SBP 120-160 for first 24 hours then less than 180   Hyperlipidemia Home meds: None LDL 35, goal < 70 High intensity statin not indicated as LDL below goal Continue statin at discharge  Respiratory failure Patient left intubated after thrombectomy Ventilator management per CCM One-way extubation 5/10  Dysphagia Patient has post-stroke dysphagia, SLP consulted    Diet   Diet NPO time specified   Advance diet as tolerated  Other Stroke Risk Factors Advanced age   Other Active Problems Right arm fracture, continue PT/OT when able  Hospital day # 0  Patient seen by NP with MD, MD to edit note as needed. Cortney E Bucky Cardinal , MSN, AGACNP-BC [facility] See Amion for schedule and pager information 08/19/2023 4:27 PM    ATTENDING ATTESTATION:  89 year old history atrial fibrillation on Eliquis  recent fall with a right arm fracture.  MRI shows right M1 occlusion status post thrombectomy.  She was intubated.  MRI obtained today shows right basal ganglia hemorrhagic transformation.  Eliquis  and aspirin held.  Continue to monitor and likely restart in 2 to 3 days.  Successfully extubated later today.  Weaned off neo-.  Appreciate CCM assistance.  Continue to monitor closely with frequent neurochecks.  Dr.  Dahlia Dross evaluated pt independently, reviewed imaging, chart, labs. Discussed and formulated plan with the Resident/APP. Changes were made to the  note where appropriate. Please see APP/resident note above for details.       This patient is critically ill due to respiratory distress, stroke s/p right M1 thrombectomy and at significant risk of neurological worsening, death form heart failure, respiratory failure, recurrent stroke, bleeding from [facility], seizure, sepsis. This patient's care requires constant monitoring of vital signs, hemodynamics, respiratory and cardiac monitoring, review of multiple databases, neurological assessment, discussion with family, other specialists and medical decision making of high complexity. I spent 35 minutes of neurocritical care time in the care of this patient.   Sal Spratley,MD   To contact Stroke Continuity provider, please refer to [email or web address]. After hours, contact General Neurology

## 2023-08-19 NOTE — H&P (Signed)
 NEUROLOGY H&P NOTE   Date of service: Aug 19, 2023 Patient Name: [name] MRN:  [id number] DOB:  June 02, 1930 Chief Complaint: "Left-sided weakness"  History of Present Illness  [name] is a 89 y.o. female with hx of atrial fibrillation anticoagulated with Eliquis  who presents with left-sided weakness.  There is a report of her being normal at 8 PM, but I am not able to independently verify this.  This was at a [facility] where she was apparently normal.  She was found with left-sided weakness this evening.  Nine one was called and she was brought in as a code stroke.  She was emergently evaluated with CT/CTA.  She is not a candidate for IV tenecteplase given her anticoagulation.  Given some degree of uncertainty about her last known well, I did decide to proceed with CT perfusion in addition to CTA.  At baseline, she lives with her daughter and is fairly independent, continuing to cook for herself and do all of her own activities of daily living.  She had a mechanical fall about a week ago and broke her arm, and with deconditioning required admission to a nursing home for rehab.  Prior to recent fall, however, she was very independent Last known well: Suspect 8 p.m. Modified rankin score: 4-Needs assistance to walk and tend to bodily needs(was a 2 prior to mechanical fall a week ago)  IV Thrombolysis: No, anticoagulated Thrombectomy: Yes   NIHSS components Score: Comment  1a Level of Conscious 0[x]  1[]  2[]  3[]      1b LOC Questions 0[x]  1[]  2[]       1c LOC Commands 0[x]  1[]  2[]       2 Best Gaze 0[]  1[]  2[x]       3 Visual 0[]  1[]  2[x]  3[]      4 Facial Palsy 0[]  1[]  2[x]  3[]      5a Motor Arm - left 0[]  1[]  2[]  3[x]  4[]  UN[]    5b Motor Arm - Right 0[x]  1[]  2[]  3[]  4[]  UN[]    6a Motor Leg - Left 0[]  1[]  2[]  3[x]  4[]  UN[]    6b Motor Leg - Right 0[x]  1[]  2[]  3[]  4[]  UN[]    7 Limb Ataxia 0[x]  1[]  2[]  UN[]      8 Sensory 0[]  1[]  2[x]  UN[]      9 Best Language 0[x]  1[]  2[]  3[]      10  Dysarthria 0[]  1[x]  2[]  UN[]      11 Extinct. and Inattention 0[]  1[]  2[x]       TOTAL: 17        Past History   Past Medical History:  Diagnosis Date   Anemia    Anxiety    Arthritis    "knees" (06/18/2015)   Atrial fibrillation with RVR (HCC) 06/18/2015   h/o sig bleed on coumadin   Cardiogenic shock 05/02/2017   Complication of anesthesia 2010   "w/cardiac cath; got into a psychotic state for ~ 3 days; got me out w/Valium"   Depression    "I have it off and on; not as often as I've gotten older" (06/18/2015)   Diverticulosis    descending and sigmoid colon--Dr. Adan Holms   DJD (degenerative joint disease) of knee    left   Dyspnea    with exertion and in morning mostly when wakes up    E coli infection    History of   GERD (gastroesophageal reflux disease)    History of blood transfusion    History of Clostridium difficile colitis    IBS (irritable  bowel syndrome)    Insomnia     off chronic ambien  5mg  as of 10/11, rare/episodic use since then  DCM/CHF   LBBB (left bundle branch block)    Lymphocytic colitis 09/27/2016   Moderate to severe pulmonary hypertension (HCC) 11/26/2019   Nonischemic cardiomyopathy (HCC)    normal coronaries, EF 15-20% 04/2008, EF 50-55% 2015   Osteoporosis    on DXA 05/2010   Positive PPD    Sciatica    Right   Urinary incontinence    Occassionally   Past Surgical History:  Procedure Laterality Date   ANTERIOR APPROACH HEMI HIP ARTHROPLASTY Left 08/05/2019   Procedure: ANTERIOR APPROACH HEMI HIP ARTHROPLASTY;  Surgeon: Neil Balls, MD;  Location: [facility] OR;  Service: Orthopedics;  Laterality: Left;   BIOPSY  11/22/2017   Procedure: BIOPSY;  Surgeon: Kenney Peacemaker, MD;  Location: [facility] ENDOSCOPY;  Service: Endoscopy;;   CARDIAC CATHETERIZATION  2010   CARDIOVERSION N/A 06/19/2015   Procedure: CARDIOVERSION;  Surgeon: Darlis Eisenmenger, MD;  Location: [facility] ENDOSCOPY;  Service: Cardiovascular;  Laterality: N/A;   CATARACT EXTRACTION W/ INTRAOCULAR  LENS  IMPLANT, BILATERAL Bilateral    COLONOSCOPY WITH PROPOFOL  N/A 11/22/2017   Procedure: COLONOSCOPY WITH PROPOFOL ;  Surgeon: Kenney Peacemaker, MD;  Location: [facility] ENDOSCOPY;  Service: Endoscopy;  Laterality: N/A;   DILATION AND CURETTAGE OF UTERUS     OPEN REDUCTION INTERNAL FIXATION (ORIF) DISTAL RADIAL FRACTURE Right 08/09/2023   Procedure: OPEN REDUCTION INTERNAL FIXATION (ORIF) DISTAL RADIUS FRACTURE;  Surgeon: Arvil Birks, MD;  Location: [facility] OR;  Service: Orthopedics;  Laterality: Right;   ORIF ANKLE FRACTURE Right 09/22/2020   Procedure: RIGHT ANKLE OPEN REDUCTION INTERNAL FIXATION (ORIF);  Surgeon: Dayne Even, MD;  Location: [facility] ORS;  Service: Orthopedics;  Laterality: Right;   ORIF TIBIA PLATEAU Right 06/28/2022   Procedure: OPEN REDUCTION INTERNAL FIXATION (ORIF) TIBIA;  Surgeon: Laneta Pintos, MD;  Location: [facility] OR;  Service: Orthopedics;  Laterality: Right;   TEE WITHOUT CARDIOVERSION N/A 06/19/2015   Procedure: TRANSESOPHAGEAL ECHOCARDIOGRAM (TEE);  Surgeon: Darlis Eisenmenger, MD;  Location: [facility] ENDOSCOPY;  Service: Cardiovascular;  Laterality: N/A;   TOTAL KNEE ARTHROPLASTY Left 04/25/2017   Procedure: TOTAL KNEE ARTHROPLASTY;  Surgeon: Dayne Even, MD;  Location: [facility] OR;  Service: Orthopedics;  Laterality: Left;   TOTAL KNEE ARTHROPLASTY Right 02/18/2020   Procedure: RIGHT TOTAL KNEE ARTHROPLASTY;  Surgeon: Dayne Even, MD;  Location: [facility];  Service: Orthopedics;  Laterality: Right;   TOTAL KNEE ARTHROPLASTY Right 06/16/2020   Procedure: RIGHT RETINACULAR REPAIR;  Surgeon: Dayne Even, MD;  Location: [facility];  Service: Orthopedics;  Laterality: Right;   TOTAL KNEE REVISION Left 12/03/2019   Procedure: LEFT TOTAL KNEE REVISION  OF POYLY EXCHANGE;  Surgeon: Dayne Even, MD;  Location: [facility] ORS;  Service: Orthopedics;  Laterality: Left;   VAGINAL HYSTERECTOMY  1979   ovaries intact   Family History  Problem Relation Age of Onset   Colon cancer Father        possible colon  cancer   Other Father        esophagus tumor?   Tuberculosis Mother    Breast cancer Neg Hx    Stomach cancer Neg Hx    Pancreatic cancer Neg Hx    Social History   Socioeconomic History   Marital status: Widowed    Spouse name: Josefina Nian    Number of children: 2   Years of education: 13   Highest education level: Not on file  Occupational History   Occupation: Retired    Associate Professor: RETIRED  Tobacco Use   Smoking status: Never   Smokeless tobacco: Never  Vaping Use   Vaping status: Never Used  Substance and Sexual Activity   Alcohol  use: No    Alcohol /week: 0.0 standard drinks of alcohol    Drug use: No   Sexual activity: Not Currently    Birth control/protection: Post-menopausal, Surgical    Comment: Hysterectomy  Other Topics Concern   Not on file  Social History Narrative   Retired, former [employer]   Widowed 2024   Was married, 1953   2 grown children, one in [place], one out of state   Tobacco Use - No.    Drug Use - no   teaches Sunday school, reading   Social Drivers of Corporate investment banker Strain: Low Risk  (07/12/2021)   Overall Financial Resource Strain (CARDIA)    Difficulty of Paying Living Expenses: Not hard at all  Food Insecurity: No Food Insecurity (08/06/2023)   Hunger Vital Sign    Worried About Running Out of Food in the Last Year: Never true    Ran Out of Food in the Last Year: Never true  Transportation Needs: No Transportation Needs (08/06/2023)   PRAPARE - Administrator, Civil Service (Medical): No    Lack of Transportation (Non-Medical): No  Physical Activity: Sufficiently Active (08/11/2022)   Exercise Vital Sign    Days of Exercise per Week: 7 days    Minutes of Exercise per Session: 30 min  Stress: No Stress Concern Present (08/11/2022)   [employer] of Occupational Health - Occupational Stress Questionnaire    Feeling of Stress : Not at all  Social Connections: Moderately Integrated (08/06/2023)   Social  Connection and Isolation Panel [NHANES]    Frequency of Communication with Friends and Family: More than three times a week    Frequency of Social Gatherings with Friends and Family: Twice a week    Attends Religious Services: More than 4 times per year    Active Member of [employer] or Organizations: Yes    Attends Banker Meetings: Never    Marital Status: Widowed   Allergies  Allergen Reactions   Ace Inhibitors Cough   Warfarin Sodium Other (See Comments)    DOSE RELATED PHARMACOLOGIC EFFECT "bleed out"   Famotidine  Other (See Comments)    GI upset   Vancomycin  Nausea Only   Codeine Other (See Comments)    sedation   Delsym [Dextromethorphan Polistirex Er] Other (See Comments)    dizziness   Lactose Intolerance (Gi) Diarrhea   Lasix  [Furosemide ] Diarrhea    Oral Lasix  gives her diarrhea, tolerates IV Rx   Phenylephrine  Palpitations and Other (See Comments)    Nasal spray- "likely increase in nasal congestion".    Sulfamethoxazole-Trimethoprim Nausea And Vomiting    GI intolerance.    Medications   Current Facility-Administered Medications:     stroke: early stages of recovery book, , Does not apply, Once, Augustin Leber, MD   0.9 %  sodium chloride  infusion, , Intravenous, Continuous, Augustin Leber, MD, Last Rate: 75 mL/hr at 08/19/23 0334, New Bag at 08/19/23 0334   0.9 %  sodium chloride  infusion, 250 mL, Intravenous, Continuous, Ogan, Okoronkwo U, MD, Held at 08/19/23 0335   acetaminophen  (TYLENOL ) tablet 650 mg, 650 mg, Oral, Q4H PRN **OR** acetaminophen  (TYLENOL ) 160 MG/5ML solution 650 mg, 650 mg, Per Tube, Q4H PRN **OR** acetaminophen  (TYLENOL )  suppository 650 mg, 650 mg, Rectal, Q4H PRN, Augustin Leber, MD   aspirin suppository 300 mg, 300 mg, Rectal, Daily **OR** aspirin tablet 325 mg, 325 mg, Oral, Daily, Niara Bunker, Althea Atkinson, MD   Chlorhexidine  Gluconate Cloth 2 % PADS 6 each, 6 each, Topical, Daily, Alecia Ames, Althea Atkinson, MD    clevidipine (CLEVIPREX) infusion 0.5 mg/mL, 0-21 mg/hr, Intravenous, Continuous, Reagan Camera, MD, Held at 08/19/23 0335   Oral care mouth rinse, 15 mL, Mouth Rinse, Q2H, Daviona Herbert, Althea Atkinson, MD   Oral care mouth rinse, 15 mL, Mouth Rinse, PRN, Alecia Ames, Althea Atkinson, MD   phenylephrine  (NEO-SYNEPHRINE) 20mg /NS 250mL premix infusion, 25-200 mcg/min, Intravenous, Titrated, Ogan, Okoronkwo U, MD, Last Rate: 37.5 mL/hr at 08/19/23 0335, 50 mcg/min at 08/19/23 0335   propofol  (DIPRIVAN ) 1000 MG/100ML infusion, 5-80 mcg/kg/min, Intravenous, Titrated, Ogan, Okoronkwo U, MD, Last Rate: 12.67 mL/hr at 08/19/23 0336, 30 mcg/kg/min at 08/19/23 0336   sodium chloride  0.9 % bolus 250 mL, 250 mL, Intravenous, PRN, Reagan Camera, MD   Vitals   Vitals:   08/19/23 0315 08/19/23 0324 08/19/23 0330 08/19/23 0345  BP: (!) 149/77 132/89 (!) 136/96 (!) 137/91  Pulse: 88 (!) 109 (!) 101 94  Resp: 16 16 16 18   Temp:      TempSrc:      SpO2: 100% 100% 100% 100%  Weight:    73 kg  Height:    5\' 6"  (1.676 m)     Body mass index is 25.98 kg/m.  Physical Exam   Constitutional: Appears well-developed and well-nourished.  Psych: Affect appropriate to situation.  Eyes: No scleral injection.  HENT: No OP obstruction.  Head: Normocephalic.  Cardiovascular: Normal rate and regular rhythm.  Respiratory: Effort normal, non-labored breathing.  GI: Soft.  No distension. There is no tenderness.  Skin: WDI.   Neurologic Examination   Neuro: Mental Status: Patient is awake, alert, oriented to person, age, month No signs of aphasia, but she does have a dense hemineglect Cranial Nerves: II: Left hemianopia. Pupils are equal, round, and reactive to light.   III,IV, VI: Right gaze deviation VII: Facial movement is weak on the left Motor: She has a dense left hemiplegia, but does withdraw minimally to noxious stimulation, moves right side well Sensory: Sensation is diminished on the left Cerebellar: No clear  ataxia in the right arm Labs   CBC:  Recent Labs  Lab 08/19/23 0014 08/19/23 0022  WBC 9.3  --   NEUTROABS 6.6  --   HGB 11.5* 11.9*  HCT 35.8* 35.0*  MCV 100.6*  --   PLT 332  --    Basic Metabolic Panel:  Lab Results  Component Value Date   NA 135 08/19/2023   K 4.2 08/19/2023   CO2 24 08/19/2023   GLUCOSE 117 (H) 08/19/2023   BUN 22 08/19/2023   CREATININE 0.90 08/19/2023   CALCIUM  9.2 08/19/2023   GFRNONAA >60 08/19/2023   GFRAA 83 05/28/2020   Lipid Panel:  Lab Results  Component Value Date   LDLCALC 54 05/04/2021   HgbA1c:  Lab Results  Component Value Date   HGBA1C 5.4 09/22/2020   Urine Drug Screen: No results found for: "LABOPIA", "COCAINSCRNUR", "LABBENZ", "AMPHETMU", "THCU", "LABBARB"  Alcohol  Level     Component Value Date/Time   ETH <15 08/19/2023 0014   INR  Lab Results  Component Value Date   INR 1.2 08/19/2023   APTT  Lab Results  Component Value Date   APTT 34 08/19/2023  CT Head without contrast(Personally reviewed): Negative  CT angio Head and Neck with contrast(Personally reviewed): Right M1 occlusion  Assessment   SORREL HARTSTEIN is a 89 y.o. female who was fairly independent  Primary Diagnosis:  Cerebral infarction due to embolism of  right middle cerebral artery.   Secondary Diagnosis: Paroxysmal atrial fibrillation Chronic combined systolic and diastolic heart failure Moderate to severe pulmonary hypertension  Recommendations  - HgbA1c, fasting lipid panel - MRI of the brain without contrast - Frequent neuro checks - Echocardiogram - Would hold anticoagulation for now given contrast staining - Risk factor modification - Telemetry monitoring - PT consult, OT consult, Speech consult - Stroke team to follow  ______________________________________________________________________   Signed, Ann Keto, MD [facility]   This patient is critically ill and at significant risk of  neurological worsening, death and care requires constant monitoring of vital signs, hemodynamics,respiratory and cardiac monitoring, neurological assessment, discussion with family, other specialists and medical decision making of high complexity. I spent 60 minutes of neurocritical care time  in the care of  this patient. This was time spent independent of any time provided by nurse practitioner or PA.

## 2023-08-19 NOTE — ED Provider Notes (Signed)
 [place] EMERGENCY DEPARTMENT AT [facility] Provider Note   CSN: [id number] Arrival date & time: 08/19/23  8295  An emergency department physician performed an initial assessment on this suspected stroke patient at 0013.  History  Chief Complaint  Patient presents with   Code Stroke    [name] is a 89 y.o. female.  Patient presents the emergency department via EMS from Nursing home.  Patient was found slumped over in her chair with last known well at 8 PM.  EMS was called for strokelike symptoms and noted left-sided facial droop, left-sided arm weakness, left-sided neglect.  Facility did not acknowledge any known falls.  The patient does take Eliquis  due to atrial fibrillation.  She recently had ORIF at the end of April for a distal right side radius fracture.  She was in rehab due to this fracture.  The patient normally lives at home with family.  She is independent, cooks her own meals, and is ambulatory at baseline.  Past medical history otherwise significant for nonischemic cardiomyopathy, left bundle branch block, atrial fibrillation on Eliquis   HPI     Home Medications Prior to Admission medications   Medication Sig Start Date End Date Taking? Authorizing Provider  apixaban  (ELIQUIS ) 2.5 MG TABS tablet Take 1 tablet (2.5 mg total) by mouth 2 (two) times daily. 08/11/23  Yes Sheikh, Omair Latif, DO  acetaminophen  (TYLENOL ) 500 MG tablet Take 2 tablets (1,000 mg total) by mouth every 8 (eight) hours as needed. 07/07/22   Donnie Galea, MD  bisacodyl  (DULCOLAX) 5 MG EC tablet Take 1 tablet (5 mg total) by mouth daily as needed for moderate constipation. 08/11/23   Aura Leeds Latif, DO  calcium  carbonate (TUMS - DOSED IN MG ELEMENTAL CALCIUM ) 500 MG chewable tablet Chew 500 mg by mouth 2 (two) times daily as needed for indigestion or heartburn.    [provider]  cholecalciferol  (VITAMIN D3) 25 MCG (1000 UNIT) tablet Take 1,000 Units by mouth in the  morning.    [provider]  digoxin  (LANOXIN ) 0.125 MG tablet Take 1 tablet (0.125 mg total) by mouth every Monday, Wednesday, and Friday. 11/23/22   Croitoru, Mihai, MD  fluticasone  (FLONASE ) 50 MCG/ACT nasal spray SPRAY 2 SPRAYS INTO EACH NOSTRIL EVERY DAY 08/10/23   Donnie Galea, MD  loperamide  (IMODIUM ) 2 MG capsule Take 2 mg by mouth 3 (three) times daily as needed for diarrhea or loose stools.     [provider]  loratadine (CLARITIN) 10 MG tablet Take 10 mg by mouth daily as needed for allergies.    [provider]  metoprolol  (TOPROL  XL) 200 MG 24 hr tablet Take 1 tablet (200 mg total) by mouth daily. 01/31/23   D[facility], Warren Haber, PA  mupirocin  ointment (BACTROBAN ) 2 % Place 1 Application into the nose 2 (two) times daily. 08/11/23   Sheikh, Jonel Nephew Latif, DO  ondansetron  (ZOFRAN ) 4 MG tablet Take 1 tablet (4 mg total) by mouth every 8 (eight) hours as needed for nausea or vomiting. 11/15/22   Donnie Galea, MD  pantoprazole  (PROTONIX ) 20 MG tablet TAKE 1 TABLET BY MOUTH EVERY DAY 03/14/23   Donnie Galea, MD  potassium chloride  SA (KLOR-CON  M20) 20 MEQ tablet TAKE 2 TABLETS BY MOUTH DAILY Patient taking differently: Take 20 mEq by mouth 2 (two) times daily. 06/09/23   Croitoru, Mihai, MD  Povidone (IVIZIA DRY EYES OP) Apply 2 drops to eye Once PRN.    [provider]  senna-docusate (SENOKOT-S) 8.6-50 MG tablet Take 1 tablet by mouth at bedtime as needed for mild constipation. 08/11/23   Aura Leeds Latif, DO  torsemide  (DEMADEX ) 20 MG tablet Take 1 tablet (20 mg total) by mouth daily. 07/24/23   Donnie Galea, MD  venlafaxine  XR (EFFEXOR -XR) 37.5 MG 24 hr capsule TAKE 2 CAPSULES (75 MG TOTAL) BY MOUTH EVERY MORNING. 06/13/23   Donnie Galea, MD      Allergies    Ace inhibitors, Warfarin sodium, Famotidine , Vancomycin , Codeine, Delsym [dextromethorphan polistirex er], Lactose intolerance (gi), Lasix  [furosemide ], Phenylephrine , and  Sulfamethoxazole-trimethoprim    Review of Systems   Review of Systems  Physical Exam Updated Vital Signs BP 123/76   Pulse (!) 112   Temp (!) 96.4 F (35.8 C) (Axillary)   Resp 20   Ht 5\' 5"  (1.651 m)   Wt 70.4 kg   SpO2 95%   BMI 25.83 kg/m  Physical Exam Vitals and nursing note reviewed.  HENT:     Head: Normocephalic and atraumatic.  Eyes:     Conjunctiva/sclera: Conjunctivae normal.  Cardiovascular:     Rate and Rhythm: Normal rate.  Pulmonary:     Effort: Pulmonary effort is normal.     Breath sounds: Normal breath sounds.  Musculoskeletal:     Comments: Right arm in splint  Skin:    General: Skin is warm and dry.  Neurological:     Mental Status: She is alert.     Comments: Patient with decreased LOC, right gaze preference , left hemianopia, left facial droop, left arm weakness, left leg weakness, left decreased sensation, Expressive aphasia , dysarthria , and left neglect.   NIHSS 19     ED Results / Procedures / Treatments   Labs (all labs ordered are listed, but only abnormal results are displayed) Labs Reviewed  PROTIME-INR - Abnormal; Notable for the following components:      Result Value   Prothrombin Time 15.8 (*)    All other components within normal limits  CBC - Abnormal; Notable for the following components:   RBC 3.56 (*)    Hemoglobin 11.5 (*)    HCT 35.8 (*)    MCV 100.6 (*)    All other components within normal limits  DIFFERENTIAL - Abnormal; Notable for the following components:   Abs Immature Granulocytes 0.08 (*)    All other components within normal limits  COMPREHENSIVE METABOLIC PANEL WITH GFR - Abnormal; Notable for the following components:   Glucose, Bld 122 (*)    Albumin  3.4 (*)    All other components within normal limits  I-STAT CHEM 8, ED - Abnormal; Notable for the following components:   Glucose, Bld 117 (*)    Calcium , Ion 1.05 (*)    Hemoglobin 11.9 (*)    HCT 35.0 (*)    All other components within normal  limits  CBG MONITORING, ED - Abnormal; Notable for the following components:   Glucose-Capillary 106 (*)    All other components within normal limits  APTT  ETHANOL  LIPID PANEL  HEMOGLOBIN A1C    EKG EKG Interpretation Date/Time:  Saturday Aug 19 2023 00:46:09 EDT Ventricular Rate:  123 PR Interval:  122 QRS Duration:  153 QT Interval:  381 QTC Calculation: 546 R Axis:   -88  Text Interpretation: Atrial fibrillation with rapid ventricular response Left bundle branch block Left axis deviation When compared with ECG of 08/09/2023, No significant change was found Confirmed by Alissa April ([id number]) on 08/19/2023  1:30:10 AM  Radiology CT ANGIO HEAD NECK W WO CM W PERF (CODE STROKE) Result Date: 08/19/2023 CLINICAL DATA:  Initial evaluation for acute neuro deficit, stroke suspected. EXAM: CT ANGIOGRAPHY HEAD AND NECK CT PERFUSION BRAIN TECHNIQUE: Multidetector CT imaging of the head and neck was performed using the standard protocol during bolus administration of intravenous contrast. Multiplanar CT image reconstructions and MIPs were obtained to evaluate the vascular anatomy. Carotid stenosis measurements (when applicable) are obtained utilizing NASCET criteria, using the distal internal carotid diameter as the denominator. Multiphase CT imaging of the brain was performed following IV bolus contrast injection. Subsequent parametric perfusion maps were calculated using RAPID software. RADIATION DOSE REDUCTION: This exam was performed according to the departmental dose-optimization program which includes automated exposure control, adjustment of the mA and/or kV according to patient size and/or use of iterative reconstruction technique. CONTRAST:  100mL OMNIPAQUE  IOHEXOL  350 MG/ML SOLN COMPARISON:  CT from earlier the same day. FINDINGS: CTA NECK FINDINGS Aortic arch: Standard branching. Imaged portion shows no evidence of aneurysm or dissection. No significant stenosis of the major arch vessel  origins. Right carotid system: Right common and internal carotid arteries are tortuous but patent without dissection. Mild atheromatous plaque about the right carotid bulb without stenosis. Left carotid system: Left common and internal carotid arteries are tortuous but patent without dissection. Mild atheromatous change about the left carotid bulb without stenosis. Vertebral arteries: Both vertebral arteries patent without stenosis or dissection. Left vertebral artery dominant. Skeleton: No worrisome osseous lesions. Moderate cervical spondylosis at C4-5 through C6-7. Upper thoracic chronic compression deformities noted. Other neck: No other acute finding. Upper chest: Scattered atelectasis and air trapping noted within the visualized lungs. No other acute finding. Review of the MIP images confirms the above findings CTA HEAD FINDINGS Anterior circulation: Both internal carotid arteries are patent through the siphons without significant stenosis. A1 segments, anterior communicating artery complex common anterior cerebral arteries patent without stenosis. Left M1 segment and its distal branches are patent and well perfused. There is acute occlusion of the right MCA at the proximal right M1 segment. Scant collateral flow noted within the right MCA distribution distally. Posterior circulation: Both V4 segments patent without stenosis. Both PICA patent at their origins. Basilar patent without stenosis. Superior cerebral arteries patent bilaterally. Left PCA supplied via the basilar. Right PCA supplied via a hypoplastic right P1 segment and prominent right posterior communicating artery. Both PCAs remain patent without stenosis. Venous sinuses: Not well assessed due to timing of the contrast bolus. Anatomic variants: As above.  No aneurysm. Review of the MIP images confirms the above findings CT Brain Perfusion Findings: ASPECTS: 10 CBF (<30%) Volume: 35mL Perfusion (Tmax>6.0s) volume: Mismatch Volume: 92mL  Infarction Location:Acute core infarct involving the right frontotemporal region. 92 mL surrounding ischemic penumbra. Findings in keeping with the acute right M1 occlusion. IMPRESSION: 1. Positive CTA for emergent large vessel occlusion, with acute occlusion of the right MCA at the proximal right M1 segment. Scant collateral flow within the right MCA distribution distally. 2. 35 mL acute core infarct involving the right frontotemporal region with 92 mL surrounding ischemic penumbra. 3. Mild for age atheromatous disease elsewhere about the major arterial vasculature of the head and neck. No other hemodynamically significant or correctable stenosis. 4.  Aortic Atherosclerosis (ICD10-I70.0). These results were communicated to Dr. Alecia Ames at 12:45 a.m. on 08/19/2023 by text page via the [facility] messaging system. Electronically Signed   By: Virgia Griffins M.D.   On: 08/19/2023 01:24  CT HEAD CODE STROKE WO CONTRAST Result Date: 08/19/2023 CLINICAL DATA:  Code stroke. Initial evaluation for acute neuro deficit, stroke suspected. EXAM: CT HEAD WITHOUT CONTRAST TECHNIQUE: Contiguous axial images were obtained from the base of the skull through the vertex without intravenous contrast. RADIATION DOSE REDUCTION: This exam was performed according to the departmental dose-optimization program which includes automated exposure control, adjustment of the mA and/or kV according to patient size and/or use of iterative reconstruction technique. COMPARISON:  Prior study from 08/06/2023 FINDINGS: Brain: Generalized cerebral atrophy with fairly advanced chronic microvascular ischemic disease. Remote lacunar infarct at the right thalamus. No acute intracranial hemorrhage. No acute large vessel territory infarct. No mass lesion or midline shift. No hydrocephalus or extra-axial fluid collection. Vascular: No abnormal hyperdense vessel. Scattered vascular calcifications noted within the carotid siphons. Skull: Scalp soft tissues  within normal limits.  Calvarium intact. Sinuses/Orbits: Globes and orbital soft tissues within normal limits. Paranasal sinuses and mastoid air cells are clear. Other: None. ASPECTS [facility] Stroke Program Early CT Score) - Ganglionic level infarction (caudate, lentiform nuclei, internal capsule, insula, M1-M3 cortex): 7 - Supraganglionic infarction (M4-M6 cortex): 3 Total score (0-10 with 10 being normal): 10 IMPRESSION: 1. No acute intracranial abnormality. 2. ASPECTS is 10. 3. Age-related cerebral atrophy with advanced chronic microvascular ischemic disease. Remote right thalamic lacunar infarct. These results were communicated to Dr. Alecia Ames at 12:31 am on 08/19/2023 by text page via the [facility] messaging system. Electronically Signed   By: Virgia Griffins M.D.   On: 08/19/2023 00:32    Procedures Procedures    Medications Ordered in ED Medications  sodium chloride  flush (NS) 0.9 % injection 3 mL (has no administration in time range)  iohexol  (OMNIPAQUE ) 300 MG/ML solution 150 mL (has no administration in time range)   stroke: early stages of recovery book (has no administration in time range)  0.9 %  sodium chloride  infusion (has no administration in time range)  acetaminophen  (TYLENOL ) tablet 650 mg (has no administration in time range)    Or  acetaminophen  (TYLENOL ) 160 MG/5ML solution 650 mg (has no administration in time range)    Or  acetaminophen  (TYLENOL ) suppository 650 mg (has no administration in time range)  aspirin suppository 300 mg (has no administration in time range)    Or  aspirin tablet 325 mg (has no administration in time range)  iohexol  (OMNIPAQUE ) 350 MG/ML injection 100 mL (100 mLs Intravenous Contrast Given 08/19/23 0038)    ED Course/ Medical Decision Making/ A&P                                 Medical Decision Making Amount and/or Complexity of Data Reviewed Labs: ordered. Radiology: ordered.  Risk Decision regarding hospitalization.   This  patient presents to the ED for concern of altered mental status/possible stroke, this involves an extensive number of treatment options, and is a complaint that carries with it a high risk of complications and morbidity.  The differential diagnosis includes acute CVA, metabolic abnormality, encephalopathy, others   Co morbidities that complicate the patient evaluation  Atrial fibrillation   Additional history obtained:  Additional history obtained from EMS External records from outside source obtained and reviewed including recent discharge summary   Lab Tests:  I Ordered, and personally interpreted labs.  The pertinent results include:  Glucose 122   Imaging Studies ordered:  I ordered imaging studies including CT head code stroke, CT angio head  neck  I independently visualized and interpreted imaging which showed  1. Positive CTA for emergent large vessel occlusion, with acute  occlusion of the right MCA at the proximal right M1 segment. Scant  collateral flow within the right MCA distribution distally.  2. 35 mL acute core infarct involving the right frontotemporal  region with 92 mL surrounding ischemic penumbra.  3. Mild for age atheromatous disease elsewhere about the major  arterial vasculature of the head and neck. No other hemodynamically  significant or correctable stenosis.  4.  Aortic Atherosclerosis   I agree with the radiologist interpretation   Consultations Obtained:  I requested consultation with the neurologist, Dr.Kirkpatrick,  and discussed lab and imaging findings as well as pertinent plan - they recommend: confirmed MCA occlusion, discussed with neurointerventionalist and family, plan to send to IR   Test / Admission - Considered:  Patient with acute MCA occlusion, LVO positive, plan to go to for emergent IR procedure         Final Clinical Impression(s) / ED Diagnoses Final diagnoses:  Acute ischemic stroke [facility])    Rx / DC Orders ED  Discharge Orders     None         Delories Fetter 08/19/23 0216    Alissa April, MD 08/19/23 [phone]

## 2023-08-19 NOTE — H&P (Signed)
 NAME:  [name], MRN:  [id number], DOB:  08/06/1930, LOS: 0 ADMISSION DATE:  08/19/2023, CONSULTATION DATE:  08/19/2023 REFERRING MD:  Romilda Coaster, MD, CHIEF COMPLAINT:  CVA  History of Present Illness:  89 y/o female with PMH for atrial fibrillation on Eliquis , non-ischemic cardiomyopathy, LBBB, chronic HFrEF, depression, and anxiety, now presenting with right wrist injury after a fall. Found to have a R distal radius fracture s/p ORIF 08/09/2023 who was d/c from [facility] 5/2. Comes in tonight with Code stroke.  She presents via EMS from NH found to Patient was found slumped over in her chair with last known well at 8 PM. EMS was called for strokelike symptoms and noted left-sided facial droop, left-sided arm weakness, left-sided neglect. Facility did not acknowledge any known falls. The patient does take Eliquis  due to atrial fibrillation. She recently had ORIF at the end of April for a distal right side radius fracture. She was in rehab due to this fracture. The patient normally lives at home with family. She is independent, cooks her own meals, and is ambulatory at baseline.  In ED her neuro findings were," Patient with decreased LOC, right gaze preference , left hemianopia, left facial droop, left arm weakness, left leg weakness, left decreased sensation, Expressive aphasia , dysarthria , and left neglect.    NIHSS 19 " She was taken to IR and underwent a Right M1 Thrombectomy, EBL 200cc. Per IR note,"Right M1 occlusion (pre TICI=0). Successful thrombectomy.  Residual chronic right M1 stenosis (<50%). Proximal M2 stenosis >90%. Basal ganglia contrast staining on [facility] beam CT. TICI Score: 2b   Length of Bedrest: 2 hours from NIR standpoint BP goal next 24 hours: SBP 120-160   Sheath Closure: Angioseal Disposition: To ICU, intubated due to medical complexity"  Pertinent  Medical History  Anemia, Anxiety, Arthritis, Atrial fibrillation with RVR (HCC) (06/18/2015), Cardiogenic shock  (05/02/2017), Complication of anesthesia (2010), Depression, Diverticulosis, DJD (degenerative joint disease) of knee, Dyspnea, E coli infection, GERD (gastroesophageal reflux disease), History of blood transfusion, History of Clostridium difficile colitis, IBS (irritable bowel syndrome), Insomnia, LBBB (left bundle branch block), Lymphocytic colitis (09/27/2016), Moderate to severe pulmonary hypertension (HCC) (11/26/2019), Nonischemic cardiomyopathy (HCC), Osteoporosis, Positive PPD, Sciatica, and Urinary incontinence.   Significant Hospital Events: Including procedures, antibiotic start and stop dates in addition to other pertinent events   5/10 s/p Right M1 Thrombectomy, admitted to Neuro-ICU intubated and sedated  Interim History / Subjective:  N/a  Objective    Blood pressure 123/76, pulse (!) 103, temperature (!) 96.4 F (35.8 C), temperature source Axillary, resp. rate 15, height 5\' 5"  (1.651 m), weight 70.4 kg, SpO2 100%.    Vent Mode: PRVC FiO2 (%):  [40 %] 40 % Set Rate:  [16 bmp] 16 bmp Vt Set:  [470 mL] 470 mL PEEP:  [5 cmH20] 5 cmH20 Plateau Pressure:  [18 cmH20] 18 cmH20   Intake/Output Summary (Last 24 hours) at 08/19/2023 0342 Last data filed at 08/19/2023 9528 Gross per 24 hour  Intake 1055 ml  Output --  Net 1055 ml   Filed Weights   08/19/23 0000 08/19/23 0049  Weight: 70.4 kg 70.4 kg    Examination: General: on vent NAD HENT: PERRLA no icterus Lungs: rhonchi and course b/l Cardiovascular: Irreg/Irreg, no gallops or rubs Abdomen: soft nt nd bs pos Extremities: no cyanosis, clubbing, min LE edema, right arm with cast Neuro: sedated and intubated   Resolved Hospital Problem list   N/a  Assessment & Plan:  CVA  s/p right M1 thrombectomy S/p IR intervention with Rt M1 thrombectomy Following NIH scores Acute hypoxic respiratory failure Intubated on Mech vent VAP prevention Weaning from vent when appropriate Follow ABG for ventilator  adjustments Atrial fibrillation Continue with Eliquis  Rate regulation-normally on Digoxin  and Metoprolol  Congestive Heart Failure-last echo from 2024 EF 25-30% and global hypokinesis Came from IR on Phenylephrine  drip Keep SBP 120-160 goals Diuresis as needed  Best Practice (right click and "Reselect all SmartList Selections" daily)   Diet/type: NPO w/ meds via tube DVT prophylaxis DOAC Pressure ulcer(s): N/A GI prophylaxis: H2B Lines: N/A Foley:  N/A Code Status:  limited   Labs   CBC: Recent Labs  Lab 08/19/23 0014 08/19/23 0022  WBC 9.3  --   NEUTROABS 6.6  --   HGB 11.5* 11.9*  HCT 35.8* 35.0*  MCV 100.6*  --   PLT 332  --     Basic Metabolic Panel: Recent Labs  Lab 08/19/23 0014 08/19/23 0022  NA 137 135  K 4.2 4.2  CL 100 100  CO2 24  --   GLUCOSE 122* 117*  BUN 21 22  CREATININE 0.88 0.90  CALCIUM  9.2  --    GFR: Estimated Creatinine Clearance: 40.9 mL/min (by C-G formula based on SCr of 0.9 mg/dL). Recent Labs  Lab 08/19/23 0014  WBC 9.3    Liver Function Tests: Recent Labs  Lab 08/19/23 0014  AST 20  ALT 14  ALKPHOS 113  BILITOT 0.8  PROT 6.7  ALBUMIN  3.4*   No results for input(s): "LIPASE", "AMYLASE" in the last 168 hours. No results for input(s): "AMMONIA" in the last 168 hours.  ABG    Component Value Date/Time   PHART 7.495 (H) 04/27/2017 1758   PCO2ART 31.6 (L) 04/27/2017 1758   PO2ART 84.7 04/27/2017 1758   HCO3 24.3 04/27/2017 1758   TCO2 26 08/19/2023 0022   O2SAT 97.2 04/27/2017 1758     Coagulation Profile: Recent Labs  Lab 08/19/23 0014  INR 1.2    Cardiac Enzymes: No results for input(s): "CKTOTAL", "CKMB", "CKMBINDEX", "TROPONINI" in the last 168 hours.  HbA1C: Hgb A1c MFr Bld  Date/Time Value Ref Range Status  09/22/2020 11:10 AM 5.4 4.8 - 5.6 % Final    Comment:    (NOTE)         Prediabetes: 5.7 - 6.4         Diabetes: >6.4         Glycemic control for adults with diabetes: <7.0    06/24/2020 12:19 AM 5.4 4.8 - 5.6 % Final    Comment:    (NOTE) Pre diabetes:          5.7%-6.4%  Diabetes:              >6.4%  Glycemic control for   <7.0% adults with diabetes     CBG: Recent Labs  Lab 08/19/23 0013  GLUCAP 106*    Review of Systems:   Intubated and sedated, unable to get ROS  Past Medical History:  She,  has a past medical history of Anemia, Anxiety, Arthritis, Atrial fibrillation with RVR (HCC) (06/18/2015), Cardiogenic shock (05/02/2017), Complication of anesthesia (2010), Depression, Diverticulosis, DJD (degenerative joint disease) of knee, Dyspnea, E coli infection, GERD (gastroesophageal reflux disease), History of blood transfusion, History of Clostridium difficile colitis, IBS (irritable bowel syndrome), Insomnia, LBBB (left bundle branch block), Lymphocytic colitis (09/27/2016), Moderate to severe pulmonary hypertension (HCC) (11/26/2019), Nonischemic cardiomyopathy (HCC), Osteoporosis, Positive PPD, Sciatica, and Urinary incontinence.  Surgical History:   Past Surgical History:  Procedure Laterality Date   ANTERIOR APPROACH HEMI HIP ARTHROPLASTY Left 08/05/2019   Procedure: ANTERIOR APPROACH HEMI HIP ARTHROPLASTY;  Surgeon: Neil Balls, MD;  Location: [facility] OR;  Service: Orthopedics;  Laterality: Left;   BIOPSY  11/22/2017   Procedure: BIOPSY;  Surgeon: Kenney Peacemaker, MD;  Location: [facility] ENDOSCOPY;  Service: Endoscopy;;   CARDIAC CATHETERIZATION  2010   CARDIOVERSION N/A 06/19/2015   Procedure: CARDIOVERSION;  Surgeon: Darlis Eisenmenger, MD;  Location: [facility] ENDOSCOPY;  Service: Cardiovascular;  Laterality: N/A;   CATARACT EXTRACTION W/ INTRAOCULAR LENS  IMPLANT, BILATERAL Bilateral    COLONOSCOPY WITH PROPOFOL  N/A 11/22/2017   Procedure: COLONOSCOPY WITH PROPOFOL ;  Surgeon: Kenney Peacemaker, MD;  Location: [facility] ENDOSCOPY;  Service: Endoscopy;  Laterality: N/A;   DILATION AND CURETTAGE OF UTERUS     OPEN REDUCTION INTERNAL FIXATION (ORIF) DISTAL RADIAL  FRACTURE Right 08/09/2023   Procedure: OPEN REDUCTION INTERNAL FIXATION (ORIF) DISTAL RADIUS FRACTURE;  Surgeon: Arvil Birks, MD;  Location: [facility] OR;  Service: Orthopedics;  Laterality: Right;   ORIF ANKLE FRACTURE Right 09/22/2020   Procedure: RIGHT ANKLE OPEN REDUCTION INTERNAL FIXATION (ORIF);  Surgeon: Dayne Even, MD;  Location: [facility] ORS;  Service: Orthopedics;  Laterality: Right;   ORIF TIBIA PLATEAU Right 06/28/2022   Procedure: OPEN REDUCTION INTERNAL FIXATION (ORIF) TIBIA;  Surgeon: Laneta Pintos, MD;  Location: [facility] OR;  Service: Orthopedics;  Laterality: Right;   TEE WITHOUT CARDIOVERSION N/A 06/19/2015   Procedure: TRANSESOPHAGEAL ECHOCARDIOGRAM (TEE);  Surgeon: Darlis Eisenmenger, MD;  Location: [facility] ENDOSCOPY;  Service: Cardiovascular;  Laterality: N/A;   TOTAL KNEE ARTHROPLASTY Left 04/25/2017   Procedure: TOTAL KNEE ARTHROPLASTY;  Surgeon: Dayne Even, MD;  Location: [facility] OR;  Service: Orthopedics;  Laterality: Left;   TOTAL KNEE ARTHROPLASTY Right 02/18/2020   Procedure: RIGHT TOTAL KNEE ARTHROPLASTY;  Surgeon: Dayne Even, MD;  Location: [facility] ORS;  Service: Orthopedics;  Laterality: Right;   TOTAL KNEE ARTHROPLASTY Right 06/16/2020   Procedure: RIGHT RETINACULAR REPAIR;  Surgeon: Dayne Even, MD;  Location: [facility] ORS;  Service: Orthopedics;  Laterality: Right;   TOTAL KNEE REVISION Left 12/03/2019   Procedure: LEFT TOTAL KNEE REVISION  OF POYLY EXCHANGE;  Surgeon: Dayne Even, MD;  Location: [facility] ORS;  Service: Orthopedics;  Laterality: Left;   VAGINAL HYSTERECTOMY  1979   ovaries intact     Social History:   reports that she has never smoked. She has never used smokeless tobacco. She reports that she does not drink alcohol  and does not use drugs.   Family History:  Her family history includes Colon cancer in her father; Other in her father; Tuberculosis in her mother. There is no history of Breast cancer, Stomach cancer, or Pancreatic cancer.   Allergies Allergies  Allergen  Reactions   Ace Inhibitors Cough   Warfarin Sodium Other (See Comments)    DOSE RELATED PHARMACOLOGIC EFFECT "bleed out"   Famotidine  Other (See Comments)    GI upset   Vancomycin  Nausea Only   Codeine Other (See Comments)    sedation   Delsym [Dextromethorphan Polistirex Er] Other (See Comments)    dizziness   Lactose Intolerance (Gi) Diarrhea   Lasix  [Furosemide ] Diarrhea    Oral Lasix  gives her diarrhea, tolerates IV Rx   Phenylephrine  Palpitations and Other (See Comments)    Nasal spray- "likely increase in nasal congestion".    Sulfamethoxazole-Trimethoprim Nausea And Vomiting    GI intolerance.     Home Medications  Prior to Admission medications   Medication Sig Start Date End Date Taking? Authorizing Provider  apixaban  (ELIQUIS ) 2.5 MG TABS tablet Take 1 tablet (2.5 mg total) by mouth 2 (two) times daily. 08/11/23  Yes Sheikh, Omair Latif, DO  acetaminophen  (TYLENOL ) 500 MG tablet Take 2 tablets (1,000 mg total) by mouth every 8 (eight) hours as needed. 07/07/22   Donnie Galea, MD  bisacodyl  (DULCOLAX) 5 MG EC tablet Take 1 tablet (5 mg total) by mouth daily as needed for moderate constipation. 08/11/23   Sheikh, Omair Latif, DO  calcium  carbonate (TUMS - DOSED IN MG ELEMENTAL CALCIUM ) 500 MG chewable tablet Chew 500 mg by mouth 2 (two) times daily as needed for indigestion or heartburn.    [provider]  cholecalciferol  (VITAMIN D3) 25 MCG (1000 UNIT) tablet Take 1,000 Units by mouth in the morning.    [provider]  digoxin  (LANOXIN ) 0.125 MG tablet Take 1 tablet (0.125 mg total) by mouth every Monday, Wednesday, and Friday. 11/23/22   Croitoru, Mihai, MD  fluticasone  (FLONASE ) 50 MCG/ACT nasal spray SPRAY 2 SPRAYS INTO EACH NOSTRIL EVERY DAY 08/10/23   Donnie Galea, MD  loperamide  (IMODIUM ) 2 MG capsule Take 2 mg by mouth 3 (three) times daily as needed for diarrhea or loose stools.     [provider]  loratadine (CLARITIN) 10 MG tablet Take  10 mg by mouth daily as needed for allergies.    [provider]  metoprolol  (TOPROL  XL) 200 MG 24 hr tablet Take 1 tablet (200 mg total) by mouth daily. 01/31/23   [facility], Warren Haber, PA  mupirocin  ointment (BACTROBAN ) 2 % Place 1 Application into the nose 2 (two) times daily. 08/11/23   Aura Leeds Latif, DO  ondansetron  (ZOFRAN ) 4 MG tablet Take 1 tablet (4 mg total) by mouth every 8 (eight) hours as needed for nausea or vomiting. 11/15/22   Donnie Galea, MD  pantoprazole  (PROTONIX ) 20 MG tablet TAKE 1 TABLET BY MOUTH EVERY DAY 03/14/23   Donnie Galea, MD  potassium chloride  SA (KLOR-CON  M20) 20 MEQ tablet TAKE 2 TABLETS BY MOUTH DAILY Patient taking differently: Take 20 mEq by mouth 2 (two) times daily. 06/09/23   Croitoru, Mihai, MD  Povidone (IVIZIA DRY EYES OP) Apply 2 drops to eye Once PRN.    [provider]  senna-docusate (SENOKOT-S) 8.6-50 MG tablet Take 1 tablet by mouth at bedtime as needed for mild constipation. 08/11/23   Aura Leeds Latif, DO  torsemide  (DEMADEX ) 20 MG tablet Take 1 tablet (20 mg total) by mouth daily. 07/24/23   Donnie Galea, MD  venlafaxine  XR (EFFEXOR -XR) 37.5 MG 24 hr capsule TAKE 2 CAPSULES (75 MG TOTAL) BY MOUTH EVERY MORNING. 06/13/23   Donnie Galea, MD     Critical care time: 45   The patient is critically ill with multiple organ system failure and requires high complexity decision making for assessment and support, frequent evaluation and titration of therapies, advanced monitoring, review of radiographic studies and interpretation of complex data.   Critical Care Time devoted to patient care services, exclusive of separately billable procedures, described in this note is 35 minutes.   Claven Cumming, MD [facility] See Amion for pager  If no response to pager , please call [phone] until 7pm After 7:00 pm call Elink  [phone] 08/19/2023, 3:42 AM

## 2023-08-19 NOTE — ED Notes (Addendum)
 Patient transported to IR with this nurse and Rapid Response nurse

## 2023-08-19 NOTE — Plan of Care (Signed)
  Problem: Ischemic Stroke/TIA Tissue Perfusion: Goal: Complications of ischemic stroke/TIA will be minimized Outcome: Progressing   Problem: Health Behavior/Discharge Planning: Goal: Goals will be collaboratively established with patient/family Outcome: Progressing   Problem: Cardiovascular: Goal: Ability to achieve and maintain adequate cardiovascular perfusion will improve Outcome: Progressing Goal: Vascular access site(s) Level 0-1 will be maintained Outcome: Progressing   Problem: Clinical Measurements: Goal: Ability to maintain clinical measurements within normal limits will improve Outcome: Progressing   Problem: Elimination: Goal: Will not experience complications related to urinary retention Outcome: Progressing   Problem: Pain Managment: Goal: General experience of comfort will improve and/or be controlled Outcome: Progressing

## 2023-08-19 NOTE — Code Documentation (Signed)
 Stroke Response Nurse Documentation Code Documentation  [name] is a 89 y.o. female arriving to [facility]  via [facility] on 5/10 with past medical hx of afib, CHF, depression, anxiety. On Eliquis  (apixaban ) daily. Code stroke was activated by EMS.   Patient from SNF where she was LKW at 2000 and now complaining of right gaze and left sided weakness.   Stroke team at the bedside on patient arrival. Labs drawn and patient cleared for CT by Dr. Candelaria Chaco. Patient to CT with team. NIHSS 19, see documentation for details and code stroke times. Patient with decreased LOC, right gaze preference , left hemianopia, left facial droop, left arm weakness, left leg weakness, left decreased sensation, Expressive aphasia , dysarthria , and left neglect on exam. The following imaging was completed:  CT Head, CTA, and CTP. Patient is not a candidate for IV Thrombolytic due to Eliquis . Patient is a candidate for IR due to LVO.   Care Plan: Mechanical thrombectomy.   Bedside handoff with IR RN.    Dodson Freestone  Rapid Response RN

## 2023-08-19 NOTE — Progress Notes (Signed)
 Patient transported from 4N30 to MRI and back to 4N30 with RT, RN and CNA. No complications noted.

## 2023-08-19 NOTE — Anesthesia Preprocedure Evaluation (Signed)
 Anesthesia Evaluation  Patient identified by MRN, date of birth, ID band Patient confused    Reviewed: Allergy & Precautions, Patient's Chart, lab work & pertinent test resultsPreop documentation limited or incomplete due to emergent nature of procedure.  Airway Mallampati: II  TM Distance: >3 FB Neck ROM: Full    Dental  (+) Teeth Intact   Pulmonary    breath sounds clear to auscultation       Cardiovascular hypertension, Pt. on medications and Pt. on home beta blockers + Peripheral Vascular Disease and +CHF  + dysrhythmias Atrial Fibrillation  Rhythm:Irregular Rate:Tachycardia  TTE IMPRESSIONS     1. Left ventricular ejection fraction, by estimation, is 25 to 30%. The  left ventricle has severely decreased function. The left ventricle  demonstrates global hypokinesis with septal-lateral dyssynchrony  consistent with LBBB. Left ventricular diastolic  parameters are indeterminate.   2. Right ventricular systolic function is mildly reduced. The right  ventricular size is normal. There is mildly elevated pulmonary artery  systolic pressure. The estimated right ventricular systolic pressure is  37.3 mmHg.   3. Left atrial size was moderately dilated.   4. Right atrial size was mildly dilated.   5. The mitral valve is normal in structure. No evidence of mitral valve  regurgitation. No evidence of mitral stenosis.   6. The aortic valve is tricuspid. There is mild calcification of the  aortic valve. Aortic valve regurgitation is mild. No aortic stenosis is  present.   7. The inferior vena cava is normal in size with greater than 50%  respiratory variability, suggesting right atrial pressure of 3 mmHg.   8. The patient was in atrial fibrillation.     Neuro/Psych CVA    GI/Hepatic Neg liver ROS,GERD  ,,  Endo/Other  negative endocrine ROS    Renal/GU negative Renal ROS     Musculoskeletal  (+) Arthritis ,     Abdominal   Peds  Hematology  (+) Blood dyscrasia, anemia   Anesthesia Other Findings Code stroke:  left-sided facial droop, left-sided arm weakness, left-sided neglect. Last known normal 8pm  Reproductive/Obstetrics                              Anesthesia Physical Anesthesia Plan  ASA: 4 and emergent  Anesthesia Plan: General   Post-op Pain Management:    Induction: Intravenous and Rapid sequence  PONV Risk Score and Plan: 3 and Ondansetron , Treatment may vary due to age or medical condition and Dexamethasone   Airway Management Planned: Oral ETT  Additional Equipment:   Intra-op Plan:   Post-operative Plan: Post-operative intubation/ventilation  Informed Consent:    Patient has DNR.  Discussed DNR with power of attorney and Continue DNR.   Only emergency history available  Plan Discussed with: CRNA  Anesthesia Plan Comments: (Interventional radiologist who spoke with family prior to start reported family wishes to maintain DNR status perioperatively. )        Anesthesia Quick Evaluation

## 2023-08-19 NOTE — Progress Notes (Signed)
 eLink Physician-Brief Progress Note Patient Name: [name] DOB: 16-Sep-1930 MRN: [id number]   Date of Service  08/19/2023  HPI/Events of Note  89 y.o. female with hx of atrial fibrillation anticoagulated with Eliquis  who presents with left-sided weakness.  Ongoing intermittent A-fib with fluctuations 110s-1 40s, stable blood pressures  eICU Interventions  Add metoprolol  for persistent tachycardia     Intervention Category Intermediate Interventions: Arrhythmia - evaluation and management  [name] 08/19/2023, 11:52 PM

## 2023-08-19 NOTE — Progress Notes (Signed)
 NIR Progress Note:   Postop Day #0 s/p emergent Right M1 thrombectomy for acute stroke (NIHSS 17). TICI 2b recanalization (secondary to chronic M2 stenosis >90% and chronic right M1 stenosis <50%).   Recommendations: Acute ischemic stroke s/p Right MCA thrombectomy with underlying chronic ASVD: Planning MRI this morning and then wean to extubate.  Anticoagulation considerations to be driven by MRI findings and any signs of hemorrhage. Routine postop stroke care and risk factor optimization. No activity restriction from femoral access standpoint. Appreciate excellent multidisciplinary care.  Please call with questions or concerns.   Overnight events: Stroke thrombectomy completed early this am.  No significant interval events.  Weaning propofol  and neosynephrine.  Preop Rapid Analysis:   Pre-treatment:   Post-treatment:       Subjective: Unable to obtain  Objective: Physical Exam: BP 125/68   Pulse 83   Temp 98.5 F (36.9 C) (Axillary)   Resp 17   Ht 5\' 6"  (1.676 m)   Wt 73 kg   SpO2 100%   BMI 25.98 kg/m   Intubated and sedated, but arouses to voice RUE cast Non tachycardic Normal breath sounds Non distended Right femoral access site clean without hematoma Distal extremities warm   Current Facility-Administered Medications:     stroke: early stages of recovery book, , Does not apply, Once, Augustin Leber, MD   0.9 %  sodium chloride  infusion, , Intravenous, Continuous, Augustin Leber, MD, Last Rate: 75 mL/hr at 08/19/23 0800, Infusion Verify at 08/19/23 0800   0.9 %  sodium chloride  infusion, 250 mL, Intravenous, Continuous, Ogan, Okoronkwo U, MD, Held at 08/19/23 0335   acetaminophen  (TYLENOL ) tablet 650 mg, 650 mg, Oral, Q4H PRN **OR** acetaminophen  (TYLENOL ) 160 MG/5ML solution 650 mg, 650 mg, Per Tube, Q4H PRN **OR** acetaminophen  (TYLENOL ) suppository 650 mg, 650 mg, Rectal, Q4H PRN, Augustin Leber, MD   apixaban  (ELIQUIS ) tablet 5 mg, 5 mg,  Per NG tube, BID, Claven Cumming, MD   aspirin suppository 300 mg, 300 mg, Rectal, Daily **OR** aspirin tablet 325 mg, 325 mg, Oral, Daily, Kirkpatrick, Althea Atkinson, MD   Chlorhexidine  Gluconate Cloth 2 % PADS 6 each, 6 each, Topical, Daily, Alecia Ames, Althea Atkinson, MD   clevidipine (CLEVIPREX) infusion 0.5 mg/mL, 0-21 mg/hr, Intravenous, Continuous, Reagan Camera, MD, Held at 08/19/23 0335   [START ON 08/21/2023] digoxin  (LANOXIN ) tablet 0.125 mg, 0.125 mg, Oral, Q M,W,F, Kirkpatrick, Althea Atkinson, MD   insulin  aspart (novoLOG ) injection 0-15 Units, 0-15 Units, Subcutaneous, Q4H, Claven Cumming, MD   ondansetron  (ZOFRAN ) injection 4 mg, 4 mg, Intravenous, Q6H PRN, Claven Cumming, MD   Oral care mouth rinse, 15 mL, Mouth Rinse, Q2H, Augustin Leber, MD, 15 mL at 08/19/23 4098   Oral care mouth rinse, 15 mL, Mouth Rinse, PRN, Augustin Leber, MD   pantoprazole  (PROTONIX ) injection 40 mg, 40 mg, Intravenous, QHS, Claven Cumming, MD   phenylephrine  (NEO-SYNEPHRINE) 20mg /NS premix infusion, 25-200 mcg/min, Intravenous, Titrated, Ogan, Okoronkwo U, MD, Last Rate: 18.75 mL/hr at 08/19/23 0800, 25 mcg/min at 08/19/23 0800   propofol  (DIPRIVAN ) 1000 MG/100ML infusion, 5-80 mcg/kg/min, Intravenous, Titrated, Ogan, Okoronkwo U, MD, Last Rate: 12.6 mL/hr at 08/19/23 0800, 30 mcg/kg/min at 08/19/23 0800   sodium chloride  0.9 % bolus 250 mL, 250 mL, Intravenous, PRN, Reagan Camera, MD   venlafaxine  XR (EFFEXOR -XR) 24 hr capsule 75 mg, 75 mg, Oral, q morning, Augustin Leber, MD  Labs: CBC Recent Labs    08/19/23 0014 08/19/23 0022 08/19/23 1191 08/19/23 4782  WBC 9.3  --   --  10.6*  HGB 11.5*   < > 9.9* 10.4*  HCT 35.8*   < > 29.0* 32.2*  PLT 332  --   --  296   < > = values in this interval not displayed.   BMET Recent Labs    08/19/23 0014 08/19/23 0022 08/19/23 0358 08/19/23 0912  NA 137 135 135 137  K 4.2 4.2 3.6 4.1  CL 100 100  --  101  CO2 24  --   --  24  GLUCOSE 122*  117*  --  102*  BUN 21 22  --  15  CREATININE 0.88 0.90  --  0.72  CALCIUM  9.2  --   --  8.8*   LFT Recent Labs    08/19/23 0014  PROT 6.7  ALBUMIN  3.4*  AST 20  ALT 14  ALKPHOS 113  BILITOT 0.8   PT/INR Recent Labs    08/19/23 0014  LABPROT 15.8*  INR 1.2      LOS: 0 days   I spent a total of 15 minutes in face to face in clinical consultation, greater than 50% of which was counseling/coordinating.  Aldred Mase  08/19/2023 10:08 AM

## 2023-08-19 NOTE — Progress Notes (Signed)
 SLP Cancellation Note  Patient Details Name: [name] MRN: [id number] DOB: 03/28/1931   Cancelled treatment:       Reason Eval/Treat Not Completed: Medical issues which prohibited therapy; pt on mechanical ventilation; will continue to follow for readiness    [name] MA, CCC-SLP Acute Rehabilitation Services   08/19/2023, 7:35 AM

## 2023-08-20 ENCOUNTER — Inpatient Hospital Stay (HOSPITAL_COMMUNITY)

## 2023-08-20 ENCOUNTER — Other Ambulatory Visit (HOSPITAL_COMMUNITY)

## 2023-08-20 DIAGNOSIS — Z515 Encounter for palliative care: Secondary | ICD-10-CM

## 2023-08-20 DIAGNOSIS — I63411 Cerebral infarction due to embolism of right middle cerebral artery: Secondary | ICD-10-CM | POA: Diagnosis not present

## 2023-08-20 DIAGNOSIS — Z7189 Other specified counseling: Secondary | ICD-10-CM | POA: Diagnosis not present

## 2023-08-20 DIAGNOSIS — I69391 Dysphagia following cerebral infarction: Secondary | ICD-10-CM | POA: Diagnosis not present

## 2023-08-20 DIAGNOSIS — R29712 NIHSS score 12: Secondary | ICD-10-CM | POA: Diagnosis not present

## 2023-08-20 DIAGNOSIS — I4891 Unspecified atrial fibrillation: Secondary | ICD-10-CM | POA: Diagnosis not present

## 2023-08-20 LAB — GLUCOSE, CAPILLARY
Glucose-Capillary: 106 mg/dL — ABNORMAL HIGH (ref 70–99)
Glucose-Capillary: 107 mg/dL — ABNORMAL HIGH (ref 70–99)
Glucose-Capillary: 110 mg/dL — ABNORMAL HIGH (ref 70–99)
Glucose-Capillary: 116 mg/dL — ABNORMAL HIGH (ref 70–99)
Glucose-Capillary: 92 mg/dL (ref 70–99)
Glucose-Capillary: 93 mg/dL (ref 70–99)

## 2023-08-20 LAB — CBC
HCT: 30.3 % — ABNORMAL LOW (ref 36.0–46.0)
Hemoglobin: 9.5 g/dL — ABNORMAL LOW (ref 12.0–15.0)
MCH: 32.2 pg (ref 26.0–34.0)
MCHC: 31.4 g/dL (ref 30.0–36.0)
MCV: 102.7 fL — ABNORMAL HIGH (ref 80.0–100.0)
Platelets: 242 10*3/uL (ref 150–400)
RBC: 2.95 MIL/uL — ABNORMAL LOW (ref 3.87–5.11)
RDW: 13.3 % (ref 11.5–15.5)
WBC: 9.6 10*3/uL (ref 4.0–10.5)
nRBC: 0 % (ref 0.0–0.2)

## 2023-08-20 LAB — BASIC METABOLIC PANEL WITH GFR
Anion gap: 9 (ref 5–15)
BUN: 9 mg/dL (ref 8–23)
CO2: 23 mmol/L (ref 22–32)
Calcium: 8.6 mg/dL — ABNORMAL LOW (ref 8.9–10.3)
Chloride: 107 mmol/L (ref 98–111)
Creatinine, Ser: 0.65 mg/dL (ref 0.44–1.00)
GFR, Estimated: >60 mL/min (ref >60)
Glucose, Bld: 100 mg/dL — ABNORMAL HIGH (ref 70–99)
Potassium: 3.6 mmol/L (ref 3.5–5.1)
Sodium: 139 mmol/L (ref 135–145)

## 2023-08-20 LAB — MAGNESIUM
Magnesium: 1.9 mg/dL (ref 1.7–2.4)
Magnesium: 2 mg/dL (ref 1.7–2.4)

## 2023-08-20 LAB — PHOSPHORUS
Phosphorus: 2.3 mg/dL — ABNORMAL LOW (ref 2.5–4.6)
Phosphorus: 2.3 mg/dL — ABNORMAL LOW (ref 2.5–4.6)

## 2023-08-20 MED ORDER — VITAL HIGH PROTEIN PO LIQD: 1000.0000 mL | ORAL | Status: DC

## 2023-08-20 MED ORDER — ORAL CARE MOUTH RINSE: 15.0000 mL | OROMUCOSAL | Status: DC | PRN

## 2023-08-20 MED ORDER — PROSOURCE TF20 ENFIT COMPATIBL EN LIQD
60.0000 mL | Freq: Every day | ENTERAL | Status: DC
  Administered 2023-08-20 – 2023-08-22 (×3): 60 mL
  Filled 2023-08-20 (×3): qty 60

## 2023-08-20 MED ORDER — ASPIRIN 81 MG PO CHEW
81.0000 mg | CHEWABLE_TABLET | Freq: Every day | ORAL | Status: DC
  Administered 2023-08-20 – 2023-08-22 (×3): 81 mg
  Filled 2023-08-20 (×3): qty 1

## 2023-08-20 MED ORDER — METOPROLOL TARTRATE 5 MG/5ML IV SOLN
2.5000 mg | INTRAVENOUS | Status: DC | PRN
  Administered 2023-08-20 – 2023-08-21 (×3): 2.5 mg via INTRAVENOUS
  Filled 2023-08-20 (×3): qty 5

## 2023-08-20 MED ORDER — OSMOLITE 1.2 CAL PO LIQD
1000.0000 mL | ORAL | Status: DC
  Administered 2023-08-20 – 2023-08-21 (×2): 1000 mL

## 2023-08-20 MED ORDER — ORAL CARE MOUTH RINSE
15.0000 mL | OROMUCOSAL | Status: DC
  Administered 2023-08-21 – 2023-08-22 (×5): 15 mL via OROMUCOSAL

## 2023-08-20 MED ORDER — ADULT MULTIVITAMIN W/MINERALS CH
1.0000 | ORAL_TABLET | Freq: Every day | ORAL | Status: DC
  Administered 2023-08-20 – 2023-08-22 (×3): 1
  Filled 2023-08-20 (×3): qty 1

## 2023-08-20 MED ORDER — METOPROLOL TARTRATE 50 MG PO TABS: 50.0000 mg | ORAL_TABLET | Freq: Two times a day (BID) | ORAL | Status: DC

## 2023-08-20 MED ORDER — ASPIRIN 81 MG PO TBEC: 81.0000 mg | DELAYED_RELEASE_TABLET | Freq: Every day | ORAL | Status: DC

## 2023-08-20 NOTE — Progress Notes (Signed)
 eLink Physician-Brief Progress Note Patient Name: [name] DOB: 04/11/1931 MRN: [id number]   Date of Service  08/20/2023  HPI/Events of Note  Pt in Afib 100-140 fluctuating, has Lopressor  50 BID scheduled. Had Lopressor  2.5 Q5 min for HR > 130 sustained, now expired.  eICU Interventions  No distress on camera PRN order placed for low dose lopressor      Intervention Category Major Interventions: Arrhythmia - evaluation and management  Eldo Umanzor G Jade Burright 08/20/2023, 10:13 PM

## 2023-08-20 NOTE — Anesthesia Postprocedure Evaluation (Signed)
 Anesthesia Post Note  Patient: [name]  Procedure(s) Performed: RADIOLOGY WITH ANESTHESIA     Patient location during evaluation: SICU Anesthesia Type: General Level of consciousness: sedated Pain management: pain level controlled Vital Signs Assessment: post-procedure vital signs reviewed and stable Respiratory status: patient remains intubated per anesthesia plan Cardiovascular status: stable Postop Assessment: no apparent nausea or vomiting Anesthetic complications: no   No notable events documented.  Last Vitals:  Vitals:   08/20/23 1100 08/20/23 1200  BP: (!) 98/44 (!) 116/56  Pulse: 70 81  Resp: 17 12  Temp:  37.1 C  SpO2: 100% 100%    Last Pain:  Vitals:   08/20/23 1200  TempSrc: Axillary  PainSc:                  Lethaniel Rave

## 2023-08-20 NOTE — Evaluation (Signed)
 Clinical/Bedside Swallow Evaluation Patient Details  Name: [name] MRN: [id number] Date of Birth: Jul 28, 1930  Today's Date: 08/20/2023 Time: SLP Start Time (ACUTE ONLY): 1149 SLP Stop Time (ACUTE ONLY): 1207 SLP Time Calculation (min) (ACUTE ONLY): 18 min  Past Medical History:  Past Medical History:  Diagnosis Date   Anemia    Anxiety    Arthritis    "knees" (06/18/2015)   Atrial fibrillation with RVR (HCC) 06/18/2015   h/o sig bleed on coumadin   Cardiogenic shock 05/02/2017   Complication of anesthesia 2010   "w/cardiac cath; got into a psychotic state for ~ 3 days; got me out w/Valium"   Depression    "I have it off and on; not as often as I've gotten older" (06/18/2015)   Diverticulosis    descending and sigmoid colon--Dr. Adan Holms   DJD (degenerative joint disease) of knee    left   Dyspnea    with exertion and in morning mostly when wakes up    E coli infection    History of   GERD (gastroesophageal reflux disease)    History of blood transfusion    History of Clostridium difficile colitis    IBS (irritable bowel syndrome)    Insomnia     off chronic ambien  5mg  as of 10/11, rare/episodic use since then  DCM/CHF   LBBB (left bundle branch block)    Lymphocytic colitis 09/27/2016   Moderate to severe pulmonary hypertension (HCC) 11/26/2019   Nonischemic cardiomyopathy (HCC)    normal coronaries, EF 15-20% 04/2008, EF 50-55% 2015   Osteoporosis    on DXA 05/2010   Positive PPD    Sciatica    Right   Urinary incontinence    Occassionally   Past Surgical History:  Past Surgical History:  Procedure Laterality Date   ANTERIOR APPROACH HEMI HIP ARTHROPLASTY Left 08/05/2019   Procedure: ANTERIOR APPROACH HEMI HIP ARTHROPLASTY;  Surgeon: Neil Balls, MD;  Location: [facility] OR;  Service: Orthopedics;  Laterality: Left;   BIOPSY  11/22/2017   Procedure: BIOPSY;  Surgeon: Kenney Peacemaker, MD;  Location: [facility] ENDOSCOPY;  Service: Endoscopy;;   CARDIAC CATHETERIZATION  2010    CARDIOVERSION N/A 06/19/2015   Procedure: CARDIOVERSION;  Surgeon: Darlis Eisenmenger, MD;  Location: [facility] ENDOSCOPY;  Service: Cardiovascular;  Laterality: N/A;   CATARACT EXTRACTION W/ INTRAOCULAR LENS  IMPLANT, BILATERAL Bilateral    COLONOSCOPY WITH PROPOFOL  N/A 11/22/2017   Procedure: COLONOSCOPY WITH PROPOFOL ;  Surgeon: Kenney Peacemaker, MD;  Location: [facility] ENDOSCOPY;  Service: Endoscopy;  Laterality: N/A;   DILATION AND CURETTAGE OF UTERUS     IR CT HEAD LTD  08/19/2023   IR PERCUTANEOUS ART THROMBECTOMY/INFUSION INTRACRANIAL INC DIAG ANGIO  08/19/2023   IR US  GUIDE VASC ACCESS RIGHT  08/19/2023   OPEN REDUCTION INTERNAL FIXATION (ORIF) DISTAL RADIAL FRACTURE Right 08/09/2023   Procedure: OPEN REDUCTION INTERNAL FIXATION (ORIF) DISTAL RADIUS FRACTURE;  Surgeon: Arvil Birks, MD;  Location: [facility] OR;  Service: Orthopedics;  Laterality: Right;   ORIF ANKLE FRACTURE Right 09/22/2020   Procedure: RIGHT ANKLE OPEN REDUCTION INTERNAL FIXATION (ORIF);  Surgeon: Dayne Even, MD;  Location: [facility] ORS;  Service: Orthopedics;  Laterality: Right;   ORIF TIBIA PLATEAU Right 06/28/2022   Procedure: OPEN REDUCTION INTERNAL FIXATION (ORIF) TIBIA;  Surgeon: Laneta Pintos, MD;  Location: [facility] OR;  Service: Orthopedics;  Laterality: Right;   TEE WITHOUT CARDIOVERSION N/A 06/19/2015   Procedure: TRANSESOPHAGEAL ECHOCARDIOGRAM (TEE);  Surgeon: Darlis Eisenmenger, MD;  Location: [facility] ENDOSCOPY;  Service: Cardiovascular;  Laterality: N/A;   TOTAL KNEE ARTHROPLASTY Left 04/25/2017   Procedure: TOTAL KNEE ARTHROPLASTY;  Surgeon: Dayne Even, MD;  Location: [facility] OR;  Service: Orthopedics;  Laterality: Left;   TOTAL KNEE ARTHROPLASTY Right 02/18/2020   Procedure: RIGHT TOTAL KNEE ARTHROPLASTY;  Surgeon: Dayne Even, MD;  Location: [facility] ORS;  Service: Orthopedics;  Laterality: Right;   TOTAL KNEE ARTHROPLASTY Right 06/16/2020   Procedure: RIGHT RETINACULAR REPAIR;  Surgeon: Dayne Even, MD;  Location: [facility] ORS;  Service:  Orthopedics;  Laterality: Right;   TOTAL KNEE REVISION Left 12/03/2019   Procedure: LEFT TOTAL KNEE REVISION  OF POYLY EXCHANGE;  Surgeon: Dayne Even, MD;  Location: [facility] ORS;  Service: Orthopedics;  Laterality: Left;   VAGINAL HYSTERECTOMY  1979   ovaries intact   HPI:  Pt is a 89 y.o. female presenting 5/10 from [facility] facility with AMS after being found slumped over in chair with L sided weakness. Found to have R M1 occlusion. S/p IR with successful thrombectomy 5/10 with "some hemorrhagic conversion." Extubated later on 5/10. PMH significant for GERD,  a-fib on Eliquis , non-ischemic cardiomyopathy, LBBB, chronic HFrEF, depression, anxiety, recent admission with R distal radius fx s/p ORIF. Pt has documented trouble swallowing as far back as 2010 per chart, although has never pursued testing or SLP when offered.    Assessment / Plan / Recommendation  Clinical Impression  Pt's level of lethargy impacts ability to safely take POs at this time. She keeps her eyes closed, which per RN has been observed throughout the day, but she is also not following commands, giving minimal verbal responses, and showing minimal awareness to oral stimulation. RN says she is more lethargic now than she has been - note that she did have PT earlier and had been visiting with family since. Pt nodded her head "yes" to wanting POs, and moved her lips slightly with oral stimulation, but there was no acceptance of any POs despite cueing. Cannot recommend a PO diet at this time, but would maintain good oral hygiene pending SLP reassessment.   SLP Visit Diagnosis: Dysphagia, unspecified (R13.10)    Aspiration Risk       Diet Recommendation NPO    Medication Administration: Via alternative means    Other  Recommendations Oral Care Recommendations: Oral care QID    Recommendations for follow up therapy are one component of a multi-disciplinary discharge planning process, led by the attending physician.   Recommendations may be updated based on patient status, additional functional criteria and insurance authorization.  Follow up Recommendations Skilled nursing-short term rehab (<3 hours/day)      Assistance Recommended at Discharge    Functional Status Assessment    Frequency and Duration min 2x/week  2 weeks       Prognosis Prognosis for improved oropharyngeal function: Good Barriers to Reach Goals: Cognitive deficits (level of alertness)      Swallow Study   General HPI: Pt is a 89 y.o. female presenting 5/10 from [facility] facility with AMS after being found slumped over in chair with L sided weakness. Found to have R M1 occlusion. S/p IR with successful thrombectomy 5/10 with "some hemorrhagic conversion." Extubated later on 5/10. PMH significant for GERD,  a-fib on Eliquis , non-ischemic cardiomyopathy, LBBB, chronic HFrEF, depression, anxiety, recent admission with R distal radius fx s/p ORIF. Pt has documented trouble swallowing as far back as 2010 per chart, although has never pursued testing or SLP when offered. Type of Study: Bedside Swallow Evaluation Previous  Swallow Assessment: none in chart although had been offered before - see HPI Diet Prior to this Study: NPO;Cortrak/Small bore NG tube Temperature Spikes Noted: No Respiratory Status: Nasal cannula History of Recent Intubation: Yes Total duration of intubation (days):  (<1 day) Date extubated: 08/19/23 Behavior/Cognition: Lethargic/Drowsy Oral Cavity Assessment: Within Functional Limits Oral Care Completed by SLP: Yes Oral Cavity - Dentition: Adequate natural dentition Patient Positioning: Upright in bed Baseline Vocal Quality: Hoarse Volitional Cough: Weak Volitional Swallow: Unable to elicit    Oral/Motor/Sensory Function Overall Oral Motor/Sensory Function: Other (comment) (not following commands for assessment)   Ice Chips Ice chips: Impaired Presentation: Spoon Oral Phase Impairments: Poor awareness of  bolus;Other (comment) (no acceptance)   Thin Liquid Thin Liquid: Impaired Presentation: Straw;Spoon Oral Phase Impairments: Poor awareness of bolus;Other (comment) (no sucking initiated, no acceptance)    Nectar Thick Nectar Thick Liquid: Not tested   Honey Thick Honey Thick Liquid: Not tested   Puree Puree: Not tested   Solid     Solid: Not tested      Beth Brooke., M.A. CCC-SLP Acute Rehabilitation Services Office: [phone]  Secure chat preferred  08/20/2023,12:30 PM

## 2023-08-20 NOTE — Progress Notes (Signed)
 Initial Nutrition Assessment  DOCUMENTATION CODES:   Not applicable  INTERVENTION:  Tube feeds via NG-tube: Start Osmolite 1.2 at 20 mL/hr and advance by 10 mL every 8 hours to goal rate of 55 mL/hr (1320 mL per day) 60 mL ProSource TF20 - Daily  Regimen at goal provides 1664 calories, 93 gm protein, and 1082 mL free water  daily.  Daily weights Multivitamin w/ minerals daily  NUTRITION DIAGNOSIS:   Inadequate oral intake related to inability to eat as evidenced by NPO status.  GOAL:   Patient will meet greater than or equal to 90% of their needs  MONITOR:   Labs, I & O's, Weight trends, Diet advancement  REASON FOR ASSESSMENT:   Consult Enteral/tube feeding initiation and management  ASSESSMENT:   89 y.o. female presented to the ED with stroke like symptoms. Pt recently discharge on 5/2 after ORIF of R wrist fracture. PMH includes IBS, CHF, and GERD. Pt admitted with CVA and acute hypoxic respiratory failure.   5/10 - Admitted; s/p R M1 thrombectomy; extubated  RD working remotely at time of assessment. RD unable to obtain any nutrition related history from pt. Per MD note, family ok with NG tube for short term use.   Per EMR, pt weight currently up since previous admission. Prior to this admission, pt weight fluctuated slightly between 65.3-68.5 kg.   Admission Weight: 70.4 kg Current Weight: 74.3 kg   Nutrition Related Medications: NovoLog  SSI, Protonix  Labs: Sodium 139, Potassium 3.6, Hgb A1c 4.3  CBG: 92-122 mg/dL x 24 hrs   UOP: 1610 mL x 24 hrs  NUTRITION - FOCUSED PHYSICAL EXAM:  Deferred to follow-up.  Diet Order:   Diet Order             Diet NPO time specified  Diet effective now                   EDUCATION NEEDS:   Not appropriate for education at this time  Skin:  Skin Assessment: Reviewed RN Assessment  Last BM:  Unknown/PTA  Height:   Ht Readings from Last 1 Encounters:  08/19/23 5\' 6"  (1.676 m)    Weight:   Wt Readings  from Last 1 Encounters:  08/20/23 74.3 kg    Ideal Body Weight:  59.1 kg  BMI:  Body mass index is 26.44 kg/m.  Estimated Nutritional Needs:  Kcal:  1600-1800 Protein:  80-100 grams Fluid:  >/= 1.6 L   Doneta Furbish RD, LDN Clinical Dietitian

## 2023-08-20 NOTE — Evaluation (Signed)
 Physical Therapy Evaluation Patient Details Name: [name] MRN: [id number] DOB: 01/28/1931 Today's Date: 08/20/2023  History of Present Illness  Pt is a 89 y.o. female presenting 5/10 from [facility] facility with AMS after being found slumped over in chair with L sided weakness. Found to have R M1 occlusion. S/p IR with successful thrombectomy 5/10 with "some hemorrhagic conversion." Extubated later on 5/10. PMH significant for a-fib on Eliquis , non-ischemic cardiomyopathy, LBBB, chronic HFrEF, depression, anxiety, recent admission with R distal radius fx s/p ORIF.   Clinical Impression  Pt in bed upon arrival of PT, agreeable to evaluation at this time. Prior to admission the pt was working with rehab at facility, reports staff present for all mobility, assisting with all ADLs, and that she was working on walking with a walker. The pt now presents with limitations in functional mobility, sensation, coordination, strength, balance, and activity tolerance due to above dx, and will continue to benefit from skilled PT to address these deficits. She was attempting to follow commands with all extremities, but demos poor attention to positioning or functional use of L extremities when not directly cued. The pt also largely maintained eyes closed this session, despite stating she was opening them, and was perseverating on her glasses (which she believed were in her hand, although no glasses present), and removing her "glove" (no glove present, believe pt to be talking about her R wrist splint). Given decline in mobility and increased need for assistance with all aspects of mobility, recommend continued skilled PT acutely and return to post-acute rehab <3hours/day once stable for d/c.          If plan is discharge home, recommend the following: Two people to help with walking and/or transfers;Two people to help with bathing/dressing/bathroom;Assistance with cooking/housework;Assistance with  feeding;Direct supervision/assist for medications management;Direct supervision/assist for financial management;Assist for transportation;Help with stairs or ramp for entrance;Supervision due to cognitive status   Can travel by private vehicle   No    Equipment Recommendations Wheelchair (measurements PT);Wheelchair cushion (measurements PT);Hospital bed;Hoyer lift  Recommendations for Other Services  OT consult;Speech consult    Functional Status Assessment Patient has had a recent decline in their functional status and demonstrates the ability to make significant improvements in function in a reasonable and predictable amount of time.     Precautions / Restrictions Precautions Precautions: Fall Recall of Precautions/Restrictions: Impaired Precaution/Restrictions Comments: R wrist in splint, SBP <180, NG tube Required Braces or Orthoses: Splint/Cast Splint/Cast: short arm cast with ACE wrap Restrictions Weight Bearing Restrictions Per Provider Order: Yes RUE Weight Bearing Per Provider Order: Weight bear through elbow only Other Position/Activity Restrictions: "Nonweightbearing in the wrist but okay to bear weight through the forearm on a platform walker" in op note 4/30      Mobility  Bed Mobility Overal bed mobility: Needs Assistance Bed Mobility: Sit to Supine, Supine to Sit     Supine to sit: Max assist, HOB elevated Sit to supine: Max assist   General bed mobility comments: maxA with pt assisting with RLE and core, max cues for movement and positioning of L extremities. dependent on assist for seated balance. pt able to initiate with trunk lowering for return to supine, dependent on therapist for LE return to bed    Transfers Overall transfer level: Needs assistance Equipment used: 1 person hand held assist Transfers: Sit to/from Stand Sit to Stand: Total assist           General transfer comment: unable to generate more  than slight clearance of hips, dependent  on therapist. pt initiating rock for momentum but then not able to power through either LE to stand, buckling of L knee noted    Ambulation/Gait               General Gait Details: unable     Balance Overall balance assessment: Needs assistance Sitting-balance support: No upper extremity supported, Feet supported Sitting balance-Leahy Scale: Poor Sitting balance - Comments: pt with some attempt to correct LOB but unable to complete wtihout assist Postural control: Posterior lean                                   Pertinent Vitals/Pain Pain Assessment Pain Assessment: No/denies pain Pain Intervention(s): Monitored during session    Home Living Family/patient expects to be discharged to:: Private residence Living Arrangements: Children Available Help at Discharge: Family;Available 24 hours/day Type of Home: House Home Access: Stairs to enter;Ramped entrance   Entrance Stairs-Number of Steps: typically uses ramp at front door   Home Layout: One level Home Equipment: Agricultural consultant (2 wheels);Wheelchair - manual;Transport chair;BSC/3in1;Grab bars - toilet;Grab bars - tub/shower;Hand held shower head;Shower seat;Lift chair Additional Comments: information from prior chart, anticipate return to rehab    Prior Function Prior Level of Function : Needs assist             Mobility Comments: assist at facility for walking with RW, reports always someone present when mobilizing. working with therapies. no additional falls at rehab ADLs Comments: pt reports assist from staff for ADLs, sometimes going to bathroom and sometimes using [facility]     Extremity/Trunk Assessment   Upper Extremity Assessment Upper Extremity Assessment: Defer to OT evaluation;RUE deficits/detail;LUE deficits/detail RUE Deficits / Details: in splint, maintained shoulder elevation despite support, would benefit from sling. fingers bruised, moved to commandf LUE Deficits / Details: limited  attention to hand/arm positioning. grossly 3/5 when directly cued for MMT    Lower Extremity Assessment Lower Extremity Assessment: RLE deficits/detail;LLE deficits/detail RLE Deficits / Details: pt following commands for movement, grossly 4-5 to MMT. noted bumps on R knee, appear chronic. pt 5/5 for sensation testin RLE Sensation: WNL RLE Coordination: WNL LLE Deficits / Details: grossly 3+/5 to MMT, tested 4/5 to sensation testing, extinction noted. limited attention and functional use when not directly cued LLE Sensation: decreased light touch;decreased proprioception LLE Coordination: decreased fine motor;decreased gross motor    Cervical / Trunk Assessment Cervical / Trunk Assessment: Kyphotic  Communication   Communication Communication: Impaired Factors Affecting Communication: Reduced clarity of speech    Cognition Arousal: Lethargic Behavior During Therapy: Flat affect   PT - Cognitive impairments: No family/caregiver present to determine baseline, Difficult to assess Difficult to assess due to: Level of arousal                     PT - Cognition Comments: pt maintained eyes closed but would answer questions. appears to be hallucinating as pt perseverating on her glasses (which she believed were in her hand, although no glasses present), and removing her "glove" (no glove present, believe pt to be talking about her R wrist splint). Pt oriented to month, off by 2 days on date, oriented to hospital and situation. attempting to follow commands with all extremities Following commands: Impaired Following commands impaired: Follows one step commands inconsistently, Follows one step commands with increased time     Cueing Cueing  Techniques: Verbal cues     General Comments General comments (skin integrity, edema, etc.): VSS on RA    Exercises     Assessment/Plan    PT Assessment Patient needs continued PT services  PT Problem List Decreased strength;Decreased  activity tolerance;Decreased balance;Decreased mobility;Pain;Decreased coordination;Decreased knowledge of use of DME;Decreased safety awareness;Impaired sensation       PT Treatment Interventions DME instruction;Gait training;Therapeutic activities;Therapeutic exercise;Functional mobility training;Balance training;Neuromuscular re-education;Patient/family education    PT Goals (Current goals can be found in the Care Plan section)  Acute Rehab PT Goals Patient Stated Goal: to improve mobility quality and reduce falls risk PT Goal Formulation: With patient Time For Goal Achievement: 09/03/23 Potential to Achieve Goals: Fair    Frequency Min 2X/week        AM-PAC PT "6 Clicks" Mobility  Outcome Measure Help needed turning from your back to your side while in a flat bed without using bedrails?: Total Help needed moving from lying on your back to sitting on the side of a flat bed without using bedrails?: Total Help needed moving to and from a bed to a chair (including a wheelchair)?: Total Help needed standing up from a chair using your arms (e.g., wheelchair or bedside chair)?: Total Help needed to walk in hospital room?: Total Help needed climbing 3-5 steps with a railing? : Total 6 Click Score: 6    End of Session Equipment Utilized During Treatment: Gait belt Activity Tolerance: Patient limited by lethargy;Patient limited by fatigue Patient left: in bed;with call bell/phone within reach;with bed alarm set;with SCD's reapplied Nurse Communication: Mobility status PT Visit Diagnosis: Other abnormalities of gait and mobility (R26.89);Muscle weakness (generalized) (M62.81);History of falling (Z91.81);Hemiplegia and hemiparesis Hemiplegia - Right/Left: Left Hemiplegia - dominant/non-dominant: Non-dominant Hemiplegia - caused by: Cerebral infarction    Time: 0831-0900 PT Time Calculation (min) (ACUTE ONLY): 29 min   Charges:   PT Evaluation $PT Eval Moderate Complexity: 1  Mod PT Treatments $Therapeutic Activity: 8-22 mins PT General Charges $$ ACUTE PT VISIT: 1 Visit         Barnabas Booth, PT, DPT   Acute Rehabilitation Department Office [phone] Secure Chat Communication Preferred  Lona Rist 08/20/2023, 10:23 AM

## 2023-08-20 NOTE — Consult Note (Signed)
**Note Catherine-Identified via Obfuscation**  Palliative Medicine Inpatient Consult Note  Consulting Provider:  de Thayne Eaton, Catherine Proffer, NP   Reason for consult:   Question Answer  Palliative Care Consult Services Palliative Medicine Consult  Reason for Consult? GOC discussion in patient with R MCA stroke, s/p thrombectomy. not passing swallow eval yet and family states she would not want a feeding tube or other prolonged support   08/20/2023  HPI:  Per intake H&P -->   89 y/o female with PMH for atrial fibrillation on Eliquis , non-ischemic cardiomyopathy, LBBB, chronic HFrEF, depression, and anxiety, now presenting with right wrist injury after a fall. Found to have a R distal radius fracture s/p ORIF 08/09/2023 who was d/c from [facility] 5/2.   Clinical Assessment/Goals of Care:  *Please note that this is a verbal dictation therefore any spelling or grammatical errors are due to the "Dragon Medical One" system interpretation.  I have reviewed medical records including EPIC notes, labs and imaging, received report from bedside RN, assessed the patient who is lying in bed somnolent though able to speak when spoken to.    I spoke with patients daughter, [name] to further discuss diagnosis prognosis, GOC, EOL wishes, disposition and options.   I introduced Palliative Medicine as specialized medical care for people living with serious illness. It focuses on providing relief from the symptoms and stress of a serious illness. The goal is to improve quality of life for both the patient and the family.  Medical History Review and Understanding:  A review of [name]'s past medical history significant for Afib, cardiomyopathy, LBB, depression, anxiety, falls, and arthritis.   Social History:  [name] is from [place]  though moved here in 1971.  She is a widow.  She has 2 children a daughter with whom she lives and a son who lives in [place] .  [name] has multiple grandchildren and [name].  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  She is a woman of the [employer].  Functional and Nutritional State:  Preceding hospitalization [name] was in [facility] rehabilitation for short term after her wrist fx. Prior to that she was living with her daughter and could get around the home and perform basic activities of daily living.  She did have a robust appetite.  Advance Directives:  A detailed discussion was had today regarding advanced directives.  Advance directives are identified in [place].   Code Status:  Concepts specific to code status, artifical feeding and hydration, continued IV antibiotics and rehospitalization was had.  The difference between a aggressive medical intervention path  and a palliative comfort care path for this patient at this time was had.   [name] is an established DO NOT RESUSCITATE DO NOT INTUBATE CODE STATUS this was confirmed with her daughter telephonically.  Discussion:  Discussions were held regarding [name]'s recent hospitalizations.  She had initially come in after sustaining a fall in her home causing a right wrist fracture - she went to [facility] rehabilitation .  She then came back for decreased level of consciousness with left-sided neglect - she was identified to have suffered a R MCA stroke requiring a thrombectomy. She has been in the neurological ICU since.   Discussed with patients daughter the possibility of her needs moving forward. Reviewed if she is not able to swallow on her own well the considerations associated with ongoing artificial nutrition. Patients daughter and SIL share that at this point in time they would like to continue present measures to see how her outcome(s) might  be. We reviewed the importance of ongoing conversations based upon [name]'s improvements or declines.   Patients family share the hope that she can improve and rehab well enough to go live in their home. They note she would have to be able to address her own bathroom needs.    Plan to continue conversations in the oncoming days.  Discussed the importance of continued conversation with family and their  medical providers regarding overall plan of care and treatment options, ensuring decisions are within the context of the patients values and GOCs.  Decision Maker: [name],[name] (Daughter): [phone] (Mobile)   SUMMARY OF RECOMMENDATIONS   DNAR/DNI  Allow time for outcomes  Patients daughter and SIL would need to see how [name] is doing prior to determining whether or not to pursue a feeding tube  Ongoing PMT support  Code Status/Advance Care Planning: DNAR/DNI  Palliative Prophylaxis:  Aspiration, Bowel Regimen, Delirium Protocol, Frequent Pain Assessment, Oral Care, Palliative Wound Care, and Turn Reposition  Additional Recommendations (Limitations, Scope, Preferences): Continue current care  Psycho-social/Spiritual:  Desire for further Chaplaincy support: Yes - [facility] Additional Recommendations: Discussed of AD's and patient wishes   Prognosis: To be determined.   Discharge Planning: To be determined.   Vitals:   08/20/23 1100 08/20/23 1200  BP: (!) 98/44 (!) 116/56  Pulse: 70 81  Resp: 17 12  Temp:  98.7 F (37.1 C)  SpO2: 100% 100%    Intake/Output Summary (Last 24 hours) at 08/20/2023 1446 Last data filed at 08/20/2023 1000 Gross per 24 hour  Intake 1799.4 ml  Output 1200 ml  Net 599.4 ml   Last Weight  Most recent update: 08/20/2023  6:57 AM    Weight  74.3 kg (163 lb 12.8 oz)            Gen:  Elderly Caucasian F chronically ill appearing HEENT: NGT, dry mucous membranes CV: Regular rate and irregular rhythm PULM:On 2LPM [place], breathing is non-labored   ABD: soft/nontender  EXT: Pedal edema  Neuro: Somnolent  PPS: 20%   This conversation/these recommendations were discussed with patient primary care team, [name] ______________________________________________________ [name] [facility]  Palliative Medicine Team Team Cell Phone: [phone] Please utilize secure chat with additional questions, if there is no response within 30 minutes please call the above phone number  Time: 33 Billing based on MDM: High

## 2023-08-20 NOTE — Plan of Care (Signed)
  Problem: Ischemic Stroke/TIA Tissue Perfusion: Goal: Complications of ischemic stroke/TIA will be minimized Outcome: Progressing   Problem: Coping: Goal: Will verbalize positive feelings about self Outcome: Progressing Goal: Will identify appropriate support needs Outcome: Progressing   Problem: Health Behavior/Discharge Planning: Goal: Goals will be collaboratively established with patient/family Outcome: Progressing   Problem: Self-Care: Goal: Ability to communicate needs accurately will improve Outcome: Progressing   Problem: Nutrition: Goal: Risk of aspiration will decrease Outcome: Progressing   Problem: Cardiovascular: Goal: Vascular access site(s) Level 0-1 will be maintained Outcome: Progressing   Problem: Clinical Measurements: Goal: Ability to maintain clinical measurements within normal limits will improve Outcome: Progressing   Problem: Elimination: Goal: Will not experience complications related to urinary retention Outcome: Progressing   Problem: Pain Managment: Goal: General experience of comfort will improve and/or be controlled Outcome: Progressing

## 2023-08-20 NOTE — Progress Notes (Signed)
 NAME:  [name], MRN:  [id number], DOB:  27-Dec-1930, LOS: 1 ADMISSION DATE:  08/19/2023, CONSULTATION DATE:  08/19/2023 REFERRING MD:  Romilda Coaster, MD, CHIEF COMPLAINT:  CVA  History of Present Illness:  89 y/o female with PMH for atrial fibrillation on Eliquis , non-ischemic cardiomyopathy, LBBB, chronic HFrEF, depression, and anxiety, now presenting with right wrist injury after a fall. Found to have a R distal radius fracture s/p ORIF 08/09/2023 who was d/c from [facility] 5/2. Comes in tonight with Code stroke.  She presents via EMS from NH found to Patient was found slumped over in her chair with last known well at 8 PM. EMS was called for strokelike symptoms and noted left-sided facial droop, left-sided arm weakness, left-sided neglect. Facility did not acknowledge any known falls. The patient does take Eliquis  due to atrial fibrillation. She recently had ORIF at the end of April for a distal right side radius fracture. She was in rehab due to this fracture. The patient normally lives at home with family. She is independent, cooks her own meals, and is ambulatory at baseline.  In ED her neuro findings were," Patient with decreased LOC, right gaze preference , left hemianopia, left facial droop, left arm weakness, left leg weakness, left decreased sensation, Expressive aphasia , dysarthria , and left neglect.    NIHSS 19 " She was taken to IR and underwent a Right M1 Thrombectomy, EBL 200cc. Per IR note,"Right M1 occlusion (pre TICI=0). Successful thrombectomy.  Residual chronic right M1 stenosis (<50%). Proximal M2 stenosis >90%. Basal ganglia contrast staining on [facility] beam CT. TICI Score: 2b   Length of Bedrest: 2 hours from NIR standpoint BP goal next 24 hours: SBP 120-160   Sheath Closure: Angioseal Disposition: To ICU, intubated due to medical complexity"  Pertinent  Medical History  Anemia, Anxiety, Arthritis, Atrial fibrillation with RVR (HCC) (06/18/2015), Cardiogenic shock  (05/02/2017), Complication of anesthesia (2010), Depression, Diverticulosis, DJD (degenerative joint disease) of knee, Dyspnea, E coli infection, GERD (gastroesophageal reflux disease), History of blood transfusion, History of Clostridium difficile colitis, IBS (irritable bowel syndrome), Insomnia, LBBB (left bundle branch block), Lymphocytic colitis (09/27/2016), Moderate to severe pulmonary hypertension (HCC) (11/26/2019), Nonischemic cardiomyopathy (HCC), Osteoporosis, Positive PPD, Sciatica, and Urinary incontinence.   Significant Hospital Events: Including procedures, antibiotic start and stop dates in addition to other pertinent events   5/10 s/p Right M1 Thrombectomy, admitted to Neuro-ICU intubated and sedated.  Tolerated extubation mid afternoon/ 5/11 mild tachycardia overnight, alert and interactive this a.m.  Interim History / Subjective:  Asking for black coffee this morning, slightly confused  Objective    Blood pressure 120/75, pulse 91, temperature 98.9 F (37.2 C), temperature source Axillary, resp. rate (!) 22, height 5\' 6"  (1.676 m), weight 74.3 kg, SpO2 100%.    Vent Mode: PSV;CPAP FiO2 (%):  [30 %] 30 % PEEP:  [5 cmH20] 5 cmH20 Pressure Support:  [8 cmH20] 8 cmH20   Intake/Output Summary (Last 24 hours) at 08/20/2023 0854 Last data filed at 08/20/2023 0800 Gross per 24 hour  Intake 2202.84 ml  Output 1200 ml  Net 1002.84 ml   Filed Weights   08/19/23 0049 08/19/23 0345 08/20/23 0500  Weight: 70.4 kg 73 kg 74.3 kg    Examination: General: Acute on chronic ill-appearing deconditioned elderly female lying in bed in no acute distress HEENT: ETT, MM pink/moist, PERRL,  Neuro: Alert and oriented x 2, slightly confused to situation CV: s1s2 regular rate and rhythm, no murmur, rubs, or gallops,  PULM:  Clear to auscultation bilaterally, no increased work of breathing, no added breath sounds GI: soft, bowel sounds active in all 4 quadrants, non-tender,  non-distended Extremities: warm/dry, no edema  Skin: no rashes or lesions     Resolved Hospital problems  Expected postprocedural orders support  Assessment & Plan:  CVA s/p right M1 thrombectomy -S/p IR intervention with Rt M1 thrombectomy -MRI brain with stable acute right MCA infarct with small hemorrhagic conversion P: Neurology following, appreciate assistance Anticoagulation on hold Neuroprotective measures Secondary stroke prevention PT/OT/SLP  Congestive Heart Failure -last echo from 2024 EF 25-30% and global hypokinesis Permanent atrial fibrillation anticoagulated with Eliquis  at baseline - Home medications include digoxin , metoprolol , and Demadex  History of pulmonary hypertension P: Repeat echo pending Continuous telemetry Anticoagulation on hold as above Resume home digoxin  and beta-blocker  Resumption of GDMT as able Strict intake and output Daily weight Optimize electrolytes   Best Practice (right click and "Reselect all SmartList Selections" daily)   Diet/type: NPO w/ meds via tube DVT prophylaxis DOAC Pressure ulcer(s): N/A GI prophylaxis: H2B Lines: N/A Foley:  N/A Code Status:  limited  Critical care time:   CRITICAL CARE Performed by: Nirel Babler D. Harris  Total critical care time: 38 minutes  Critical care time was exclusive of separately billable procedures and treating other patients.  Critical care was necessary to treat or prevent imminent or life-threatening deterioration.  Critical care was time spent personally by me on the following activities: development of treatment plan with patient and/or surrogate as well as nursing, discussions with consultants, evaluation of patient's response to treatment, examination of patient, obtaining history from patient or surrogate, ordering and performing treatments and interventions, ordering and review of laboratory studies, ordering and review of radiographic studies, pulse oximetry and re-evaluation  of patient's condition.  Princella Jaskiewicz D. Harris, NP-C [facility] Personal contact information can be found on Amion  If no contact or response made please call [phone] 08/20/2023, 8:54 AM

## 2023-08-20 NOTE — Progress Notes (Addendum)
 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  Some hemorrhagic transformation seen on MRI.  Patient extubated later in the afternoon.  INTERIM HISTORY/SUBJECTIVE  Patient remains hemodynamically stable with Tmax of 100.  She was extubated yesterday and is doing well on supplemental O2.  However, she has been unable to pass her swallow study so far.  SLP evaluation pending.  OBJECTIVE  CBC    Component Value Date/Time   WBC 9.6 08/20/2023 1008   RBC 2.95 (L) 08/20/2023 1008   HGB 9.5 (L) 08/20/2023 1008   HGB 12.5 02/05/2018 0937   HCT 30.3 (L) 08/20/2023 1008   HCT 38.4 02/05/2018 0937   PLT 242 08/20/2023 1008   PLT 320 02/05/2018 0937   MCV 102.7 (H) 08/20/2023 1008   MCV 92 02/05/2018 0937   MCH 32.2 08/20/2023 1008   MCHC 31.4 08/20/2023 1008   RDW 13.3 08/20/2023 1008   RDW 12.3 02/05/2018 0937   LYMPHSABS 1.7 08/19/2023 0014   LYMPHSABS 1.7 02/05/2018 0937   MONOABS 0.7 08/19/2023 0014   EOSABS 0.2 08/19/2023 0014   EOSABS 0.2 02/05/2018 0937   BASOSABS 0.1 08/19/2023 0014   BASOSABS 0.1 02/05/2018 0937    BMET    Component Value Date/Time   NA 139 08/20/2023 1008   NA 136 05/12/2022 1159   K 3.6 08/20/2023 1008   CL 107 08/20/2023 1008   CO2 23 08/20/2023 1008   GLUCOSE 100 (H) 08/20/2023 1008   BUN 9 08/20/2023 1008   BUN 20 05/12/2022 1159   CREATININE 0.65 08/20/2023 1008   CREATININE 0.83 07/07/2022 1540   CALCIUM  8.6 (L) 08/20/2023 1008   EGFR 75 05/12/2022 1159   GFRNONAA >60 08/20/2023 1008    IMAGING past 24 hours CT HEAD WO CONTRAST ( ) Result Date: 08/20/2023 CLINICAL DATA:  Stroke follow-up EXAM: CT HEAD WITHOUT CONTRAST TECHNIQUE: Contiguous axial images were obtained from the base of the skull through the vertex without intravenous contrast.  RADIATION DOSE REDUCTION: This exam was performed according to the departmental dose-optimization program which includes automated exposure control, adjustment of the mA and/or kV according to patient size and/or use of iterative reconstruction technique. COMPARISON:  Yesterday by brain MRI FINDINGS: Brain: Acute infarction in the right basal ganglia and anterolateral temporal cortex with swelling. No evidence of progression when compared to prior MRI which showed smaller right frontal and cerebellar infarcts as well. Petechial hemorrhage at the level of the basal ganglia on the right is non progressed when compared to prior MRI. No hydrocephalus or midline shift. There is generalized atrophy with chronic small vessel ischemia. Vascular: No hyperdense vessel or unexpected calcification. Skull: Normal. Negative for fracture or focal lesion. Sinuses/Orbits: No acute finding. IMPRESSION: No progression of acute right MCA distribution infarcts with petechial hemorrhage when compared to brain MRI yesterday. Electronically Signed   By: Ronnette Coke M.D.   On: 08/20/2023 06:02   DG Abd 1 View Result Date: 08/19/2023 CLINICAL DATA:  Nasogastric tube placement. EXAM: ABDOMEN - 1 VIEW COMPARISON:  None Available. FINDINGS: Tip and side port of the enteric tube below the diaphragm in the stomach. Nonobstructed included abdominal bowel gas pattern. IMPRESSION: Tip and side port of the enteric tube below the diaphragm in the stomach. Electronically Signed   By: Chadwick Colonel M.D.   On: 08/19/2023 17:47  Vitals:   08/20/23 0900 08/20/23 1000 08/20/23 1100 08/20/23 1200  BP: (!) 121/52 129/63 (!) 98/44 (!) 116/56  Pulse: 85 87 70 81  Resp: (!) 22 19 17 12   Temp:    98.7 F (37.1 C)  TempSrc:    Axillary  SpO2: 100% 98% 100% 100%  Weight:      Height:         PHYSICAL EXAM General: Elderly patient in no acute distress, cast on right upper extremity CV: Regular rate and rhythm on monitor Respiratory:  Respirations synchronous with ventilator  NEURO:  Mental Status: Drowsy but oriented to person place and time Speech/Language: speech is with mild dysarthria  Cranial Nerves:  II: PERRL.  III, IV, VI: EOMI. Eyelids elevate symmetrically.  V: Sensation is intact to light touch and symmetrical to face.  VII: Left facial droop VIII: hearing intact to voice. IX, X: Voice is dysarthric [id number] shrug stronger on the right XII: tongue is midline without fasciculations. Motor: Able to move right upper extremity with good antigravity strength, will wiggle toes on bilateral feet to command, moves left upper extremity but does not lift it off the bed Tone: is normal and bulk is normal Sensation- Intact to light touch bilaterally. Coordination: unable to perform Gait- deferred  NIHSS:  1a Level of Conscious.: 1 1b LOC Questions: 0 1c LOC Commands: 0 2 Best Gaze: 0 3 Visual: 0 4 Facial Palsy: 1 5a Motor Arm - left: 3 5b Motor Arm - Right: 0 6a Motor Leg - Left: 3 6b Motor Leg - Right: 3 7 Limb Ataxia: 0 8 Sensory: 0 9 Best Language: 0 10 Dysarthria: 1 11 Extinct and Inattention.: 0 TOTAL: 12     ASSESSMENT/PLAN  [name] is a 89 y.o. female with history of A-fib on Eliquis  and recent fall with right arm fracture admitted for acute onset left-sided weakness.  She was found to have a right MCA occlusion and was taken to interventional radiology for successful mechanical thrombectomy.  Follow-up MRI did demonstrate some hemorrhagic transformation, and Eliquis  was held.  Will obtain follow-up CT tomorrow morning and then discuss reinitiation of anticoagulation.  Patient was extubated in the afternoon of 5/10.  NIH on Admission 17  Acute Ischemic Infarct:  right MCA infarct s/p thrombectomy Right MA occlusion. Etiology: Likely cardioembolic in the setting of A-fib on Eliquis  Code Stroke CT head No acute abnormality. Small vessel disease. Atrophy. ASPECTS 10.    CTA head &  neck acute occlusion of right MCA proximal M1 segment CT perfusion 35 mL right sided ischemic core with 92 mL penumbra MRI acute right MCA territory infarct primarily involving right temporal lobe and right basal ganglia, prominent susceptibility in caudate and lentiform nucleus concerning for development of hemorrhagic conversion, additional foci of susceptibility in anterior inferior right temporal lobe which may also reflect hemorrhagic conversion Follow-up CT 5/11: Pending 2D Echo pending LDL 35 HgbA1c 4.3 VTE prophylaxis -SCDs Eliquis  (apixaban ) daily prior to admission, now on aspirin 81 mg daily secondary to hemorrhagic transformation.  Plan to restart Eliquis  in a few days Therapy recommendations:  Pending Disposition: pending  Atrial fibrillation Home Meds: Eliquis  2.5 mg twice daily, metoprolol  XL 200 mg daily, digoxin  0.125 mg Monday Wednesday Friday Continue telemetry monitoring Anticoagulation held given hemorrhagic transformation, will follow-up with head CT 5/11  History of hypertension, hypotensive overnight Home meds: None Able off phenylephrine  Blood Pressure Goal: SBP 120-160 for first 24 hours then less than 180   Hyperlipidemia  Home meds: None LDL 35, goal < 70 High intensity statin not indicated as LDL below goal Continue statin at discharge  Respiratory failure Patient left intubated after thrombectomy Ventilator management per CCM One-way extubation 5/10  Dysphagia Patient has post-stroke dysphagia, SLP consulted    Diet   Diet NPO time specified   Advance diet as tolerated  Other Stroke Risk Factors Advanced age   Other Active Problems Right arm fracture, continue PT/OT when able  Hospital day # 1  Patient seen by NP with MD, MD to edit note as needed. Cortney E Bucky Cardinal , MSN, AGACNP-BC [facility] See Amion for schedule and pager information 08/20/2023 2:03 PM    ATTENDING ATTESTATION:   89 year old history atrial  fibrillation on Eliquis  recent fall with a right arm fracture. right M1 occlusion status post thrombectomy.  right basal ganglia hemorrhagic transformation that is mild.  Start aspirin 1 mg today.  Continue to monitor and likely restart Eliquis  in 2 to 3 days.  Off of neodrip early this morning.  Will give another 24 hours in ICU and if stable transfer out tomorrow.    Dr. Dahlia Dross evaluated pt independently, reviewed imaging, chart, labs. Discussed and formulated plan with the Resident/APP. Changes were made to the note where appropriate. Please see APP/resident note above for details.       This patient is critically ill due to respiratory distress, stroke s/p right M1 thrombectomy and at significant risk of neurological worsening, death form heart failure, respiratory failure, recurrent stroke, bleeding from [facility], seizure, sepsis. This patient's care requires constant monitoring of vital signs, hemodynamics, respiratory and cardiac monitoring, review of multiple databases, neurological assessment, discussion with family, other specialists and medical decision making of high complexity. I spent 35 minutes of neurocritical care time in the care of this patient.    Jamiyla Ishee,MD   To contact Stroke Continuity provider, please refer to [email or web address]. After hours, contact General Neurology

## 2023-08-21 ENCOUNTER — Encounter (HOSPITAL_COMMUNITY): Payer: Self-pay | Admitting: Radiology

## 2023-08-21 ENCOUNTER — Inpatient Hospital Stay (HOSPITAL_COMMUNITY)

## 2023-08-21 ENCOUNTER — Encounter (HOSPITAL_COMMUNITY)

## 2023-08-21 DIAGNOSIS — Z7189 Other specified counseling: Secondary | ICD-10-CM | POA: Diagnosis not present

## 2023-08-21 DIAGNOSIS — Z515 Encounter for palliative care: Secondary | ICD-10-CM | POA: Diagnosis not present

## 2023-08-21 DIAGNOSIS — I429 Cardiomyopathy, unspecified: Secondary | ICD-10-CM | POA: Diagnosis not present

## 2023-08-21 DIAGNOSIS — I6389 Other cerebral infarction: Secondary | ICD-10-CM | POA: Diagnosis not present

## 2023-08-21 DIAGNOSIS — I63411 Cerebral infarction due to embolism of right middle cerebral artery: Secondary | ICD-10-CM | POA: Diagnosis not present

## 2023-08-21 DIAGNOSIS — I6381 Other cerebral infarction due to occlusion or stenosis of small artery: Secondary | ICD-10-CM | POA: Diagnosis not present

## 2023-08-21 DIAGNOSIS — I4891 Unspecified atrial fibrillation: Secondary | ICD-10-CM | POA: Diagnosis not present

## 2023-08-21 DIAGNOSIS — I739 Peripheral vascular disease, unspecified: Secondary | ICD-10-CM

## 2023-08-21 LAB — BASIC METABOLIC PANEL WITH GFR
Anion gap: 9 (ref 5–15)
BUN: 16 mg/dL (ref 8–23)
CO2: 23 mmol/L (ref 22–32)
Calcium: 9.6 mg/dL (ref 8.9–10.3)
Chloride: 108 mmol/L (ref 98–111)
Creatinine, Ser: 0.71 mg/dL (ref 0.44–1.00)
GFR, Estimated: >60 mL/min (ref >60)
Glucose, Bld: 137 mg/dL — ABNORMAL HIGH (ref 70–99)
Potassium: 3.7 mmol/L (ref 3.5–5.1)
Sodium: 140 mmol/L (ref 135–145)

## 2023-08-21 LAB — MAGNESIUM: Magnesium: 2.1 mg/dL (ref 1.7–2.4)

## 2023-08-21 LAB — ECHOCARDIOGRAM COMPLETE
AR max vel: 1.08 cm2
AV Area VTI: 1.29 cm2
AV Area mean vel: 0.95 cm2
AV Mean grad: 4 mmHg
AV Peak grad: 8.8 mmHg
Ao pk vel: 1.48 m/s
Area-P 1/2: 5.79 cm2
Calc EF: 32.6 %
Height: 66 in
MV VTI: 1.06 cm2
S' Lateral: 4.4 cm
Single Plane A2C EF: 34.3 %
Single Plane A4C EF: 25.4 %
Weight: 2553.81 [oz_av]

## 2023-08-21 LAB — CBC
HCT: 32.4 % — ABNORMAL LOW (ref 36.0–46.0)
Hemoglobin: 10.4 g/dL — ABNORMAL LOW (ref 12.0–15.0)
MCH: 32 pg (ref 26.0–34.0)
MCHC: 32.1 g/dL (ref 30.0–36.0)
MCV: 99.7 fL (ref 80.0–100.0)
Platelets: 249 10*3/uL (ref 150–400)
RBC: 3.25 MIL/uL — ABNORMAL LOW (ref 3.87–5.11)
RDW: 13.3 % (ref 11.5–15.5)
WBC: 8.3 10*3/uL (ref 4.0–10.5)
nRBC: 0 % (ref 0.0–0.2)

## 2023-08-21 LAB — PHOSPHORUS: Phosphorus: 2.6 mg/dL (ref 2.5–4.6)

## 2023-08-21 LAB — GLUCOSE, CAPILLARY
Glucose-Capillary: 109 mg/dL — ABNORMAL HIGH (ref 70–99)
Glucose-Capillary: 120 mg/dL — ABNORMAL HIGH (ref 70–99)
Glucose-Capillary: 121 mg/dL — ABNORMAL HIGH (ref 70–99)
Glucose-Capillary: 138 mg/dL — ABNORMAL HIGH (ref 70–99)
Glucose-Capillary: 147 mg/dL — ABNORMAL HIGH (ref 70–99)
Glucose-Capillary: 152 mg/dL — ABNORMAL HIGH (ref 70–99)

## 2023-08-21 LAB — BRAIN NATRIURETIC PEPTIDE: B Natriuretic Peptide: 2601 pg/mL — ABNORMAL HIGH (ref 0.0–100.0)

## 2023-08-21 MED ORDER — FUROSEMIDE 10 MG/ML IJ SOLN
40.0000 mg | Freq: Once | INTRAMUSCULAR | Status: AC
  Administered 2023-08-21: 40 mg via INTRAVENOUS
  Filled 2023-08-21: qty 4

## 2023-08-21 MED ORDER — KETOROLAC TROMETHAMINE 15 MG/ML IJ SOLN
7.5000 mg | Freq: Once | INTRAMUSCULAR | Status: AC
  Administered 2023-08-21: 7.5 mg via INTRAVENOUS
  Filled 2023-08-21: qty 1

## 2023-08-21 MED ORDER — METOPROLOL TARTRATE 50 MG PO TABS
50.0000 mg | ORAL_TABLET | Freq: Four times a day (QID) | ORAL | Status: DC
  Administered 2023-08-21 – 2023-08-22 (×5): 50 mg
  Filled 2023-08-21 (×5): qty 1

## 2023-08-21 MED ORDER — PERFLUTREN LIPID MICROSPHERE
1.0000 mL | INTRAVENOUS | Status: DC | PRN
  Administered 2023-08-21: 2 mL via INTRAVENOUS

## 2023-08-21 MED ORDER — METOPROLOL TARTRATE 5 MG/5ML IV SOLN
2.5000 mg | Freq: Once | INTRAVENOUS | Status: AC
  Administered 2023-08-21: 2.5 mg via INTRAVENOUS

## 2023-08-21 MED ORDER — METOPROLOL TARTRATE 5 MG/5ML IV SOLN
5.0000 mg | INTRAVENOUS | Status: DC | PRN
  Administered 2023-08-21 (×2): 5 mg via INTRAVENOUS
  Filled 2023-08-21 (×2): qty 5

## 2023-08-21 MED ORDER — DIGOXIN 125 MCG PO TABS
0.1250 mg | ORAL_TABLET | ORAL | Status: DC
  Administered 2023-08-21: 0.125 mg
  Filled 2023-08-21: qty 1

## 2023-08-21 NOTE — Evaluation (Signed)
 Occupational Therapy Evaluation Patient Details Name: [name] MRN: [id number] DOB: 1930-09-28 Today's Date: 08/21/2023   History of Present Illness   Pt is a 89 y.o. female presenting 5/10 from [facility] with AMS after being found slumped over in chair with L sided weakness. Found to have R M1 occlusion. S/p IR with successful thrombectomy 5/10 with "some hemorrhagic conversion." Extubated later on 5/10. PMH significant for a-fib on Eliquis , non-ischemic cardiomyopathy, LBBB, chronic HFrEF, depression, anxiety, recent admission with R distal radius fx s/p ORIF.     Clinical Impressions PTA, pt recently at [facility] for rehab. Upon eval, pt with decreased attention to L side of body, decreased sensation and strength on L, confusion, decreased safety awareness, and vision. Pt needing mod-max A for seated grooming with LUE and max-total A for UB and LB ADL. Currently requires total A for attempts to stand with inability to fully rise up from EOB. Will continue to follow. Patient will benefit from continued inpatient follow up therapy, <3 hours/day      If plan is discharge home, recommend the following:   Two people to help with walking and/or transfers;Two people to help with bathing/dressing/bathroom;Assistance with cooking/housework;Assistance with feeding;Direct supervision/assist for medications management;Direct supervision/assist for financial management;Assist for transportation;Help with stairs or ramp for entrance     Functional Status Assessment   Patient has had a recent decline in their functional status and demonstrates the ability to make significant improvements in function in a reasonable and predictable amount of time.     Equipment Recommendations   Other (comment) (defer)     Recommendations for Other Services         Precautions/Restrictions   Precautions Precautions: Fall Recall of Precautions/Restrictions:  Impaired Precaution/Restrictions Comments: R wrist in splint, SBP <180, NG tube Required Braces or Orthoses: Splint/Cast Splint/Cast: short arm cast with ACE wrap Restrictions Weight Bearing Restrictions Per Provider Order: Yes RUE Weight Bearing Per Provider Order: Weight bear through elbow only Other Position/Activity Restrictions: "Nonweightbearing in the wrist but okay to bear weight through the forearm on a platform walker" in op note 4/30     Mobility Bed Mobility Overal bed mobility: Needs Assistance Bed Mobility: Sit to Supine, Supine to Sit     Supine to sit: Max assist, HOB elevated Sit to supine: Max assist   General bed mobility comments: maxA with pt assisting with RLE and core, max cues for movement and positioning of L extremities.min-mod A for sitting balance. pt able to initiate with trunk lowering for return to supine, dependent on therapist for LE return to bed    Transfers Overall transfer level: Needs assistance Equipment used: 1 person hand held assist Transfers: Sit to/from Stand Sit to Stand: Total assist           General transfer comment: unable to generate more than slight clearance of hips, dependent on therapist. pt initiating rock for momentum but then not able to power through either LE to stand, buckling of L knee noted      Balance Overall balance assessment: Needs assistance Sitting-balance support: No upper extremity supported, Feet supported Sitting balance-Leahy Scale: Poor Sitting balance - Comments: pt with some attempt to correct LOB but unable to complete wtihout assist Postural control: Posterior lean, Left lateral lean                                 ADL either performed or assessed with  clinical judgement   ADL Overall ADL's : Needs assistance/impaired Eating/Feeding: NPO   Grooming: Wash/dry face;Moderate assistance;Sitting Grooming Details (indicate cue type and reason): with LUE Upper Body Bathing: Maximal  assistance;Sitting   Lower Body Bathing: Total assistance;Sitting/lateral leans;+2 for physical assistance;+2 for safety/equipment   Upper Body Dressing : Maximal assistance;Sitting   Lower Body Dressing: Sit to/from stand;Total assistance;+2 for physical assistance;+2 for safety/equipment               Functional mobility during ADLs: Total assistance;+2 for physical assistance;+2 for safety/equipment       Vision Patient Visual Report: No change from baseline       Perception Perception: Impaired Preception Impairment Details: Inattention/Neglect (decr attention to L side of environment and decr initiation of L side of body but does initiate on command)     Praxis Praxis: Impaired Praxis Impairment Details: Motor planning     Pertinent Vitals/Pain Pain Assessment Pain Intervention(s): Monitored during session     Extremity/Trunk Assessment Upper Extremity Assessment Upper Extremity Assessment: RUE deficits/detail;LUE deficits/detail RUE Deficits / Details: in splint, maintained shoulder elevation despite support, would benefit from sling. fingers bruised, moved to command; reaches with RUE to touch therapist hand via shoulder flexion, Attempts to pace weight through hand needing redirection during mobility LUE Deficits / Details: limited attention to hand/arm positioning. grossly 3-/5 when directly cued for MMT. good initiation on command, tremulous   Lower Extremity Assessment Lower Extremity Assessment: Defer to PT evaluation   Cervical / Trunk Assessment Cervical / Trunk Assessment: Kyphotic   Communication Communication Communication: Impaired Factors Affecting Communication: Reduced clarity of speech   Cognition Arousal: Lethargic Behavior During Therapy: Flat affect Cognition: Cognition impaired   Orientation impairments: Place, Situation (aware yesterday was mother's day and that we are in may, however, pt is adamant that she is at her daughter in law's  house and that she has not had a CVA) Awareness: Online awareness impaired Memory impairment (select all impairments): Short-term memory Attention impairment (select first level of impairment): Selective attention Executive functioning impairment (select all impairments): Sequencing, Organization, Reasoning, Problem solving                   Following commands: Impaired Following commands impaired: Follows one step commands with increased time     Cueing  General Comments   Cueing Techniques: Verbal cues  VSS on RA   Exercises     Shoulder Instructions      Home Living       Type of Home: Other(Comment) (was at home until recent fall, has been at [facility] for STR)                                  Prior Functioning/Environment Prior Level of Function : Needs assist             Mobility Comments: assist at facility for walking with RW, reports always someone present when mobilizing. working with therapies. no additional falls at rehab ADLs Comments: pt reports assist from staff for ADLs, sometimes going to bathroom and sometimes using BSC    OT Problem List: Decreased strength;Decreased activity tolerance;Impaired balance (sitting and/or standing);Decreased range of motion;Decreased cognition;Decreased safety awareness;Decreased knowledge of use of DME or AE;Decreased knowledge of precautions;Impaired UE functional use;Pain;Impaired vision/perception;Decreased coordination;Impaired sensation;Impaired tone   OT Treatment/Interventions: Self-care/ADL training;Therapeutic exercise;DME and/or AE instruction;Patient/family education;Balance training;Therapeutic activities;Cognitive remediation/compensation;Neuromuscular education;Visual/perceptual remediation/compensation      OT  Goals(Current goals can be found in the care plan section)   Acute Rehab OT Goals Patient Stated Goal: sit in her recliner in her den OT Goal Formulation: With patient Time For  Goal Achievement: 09/04/23 Potential to Achieve Goals: Good   OT Frequency:  Min 2X/week    Co-evaluation              AM-PAC OT "6 Clicks" Daily Activity     Outcome Measure Help from another person eating meals?: Total Help from another person taking care of personal grooming?: Total Help from another person toileting, which includes using toliet, bedpan, or urinal?: Total Help from another person bathing (including washing, rinsing, drying)?: Total Help from another person to put on and taking off regular upper body clothing?: Total Help from another person to put on and taking off regular lower body clothing?: Total 6 Click Score: 6   End of Session Equipment Utilized During Treatment: Gait belt Nurse Communication: Mobility status  Activity Tolerance: Patient tolerated treatment well Patient left: in bed;with call bell/phone within reach;with bed alarm set  OT Visit Diagnosis: Unsteadiness on feet (R26.81);Muscle weakness (generalized) (M62.81);History of falling (Z91.81);Other symptoms and signs involving cognitive function;Pain;Hemiplegia and hemiparesis Hemiplegia - Right/Left: Left                Time: 2956-2130 OT Time Calculation (min): 18 min Charges:  OT General Charges $OT Visit: 1 Visit OT Evaluation $OT Eval Moderate Complexity: 1 Mod  Karilyn Ouch, OTR/L [facility] Acute Rehabilitation Office: [phone]   [name] 08/21/2023, 3:52 PM

## 2023-08-21 NOTE — Progress Notes (Addendum)
 Pt work of breathing improved, RR down to 20 post one time dose of IV lasix  as ordered. Pt reports that she feels better and less winded . Provider aware, per provider continue to monitor .

## 2023-08-21 NOTE — Progress Notes (Addendum)
   Palliative Medicine Inpatient Follow Up Note   HPI: 89 y/o female with PMH for atrial fibrillation on Eliquis , non-ischemic cardiomyopathy, LBBB, chronic HFrEF, depression, and anxiety, now presenting with right wrist injury after a fall. Found to have a R distal radius fracture s/p ORIF 08/09/2023 who was d/c from [facility] 5/2.   Today's Discussion 08/21/2023  *Please note that this is a verbal dictation therefore any spelling or grammatical errors are due to the "Dragon Medical One" system interpretation.  Chart reviewed inclusive of vital signs, progress notes, laboratory results, and diagnostic images.   I met with [name] at bedside this morning she is resting comfortably and appears to be in no distress.  I spoke with [name]'s daughter, [name] who expresses that her mother would not want to live as she is now. She would not want a long term feeding tube or artificial measures to prolong live. We reviewed that her mother has a strong faith and was clear throughout her life with her children when enough would be enough.  Over the past few years [name] has asked for god to take her per her daughter. We discussed that it was not her time then though as of presently we are artificially prolonging life. We reviewed what comfort focused care would look like. Patients daughter notes that her brother is coming in later today as is her husband. They plan to speak further among themselves.   [name] greatest objective is that her mother does not suffer. She had struggled for so long with her knee and hip discomfort. She does not feel a life confined to the bed would align with [name]'s wishes. We then discussed the option of continued care versus transition to comfort measures and allowing a natural passing.   We plan to meet tomorrow for further conversations after the family is able to discuss options moving forward.  Questions and concerns addressed/Palliative Support Provided.   Objective  Assessment: Vital Signs Vitals:   08/21/23 1200 08/21/23 1300  BP: (!) 143/93 133/77  Pulse: (!) 125 (!) 126  Resp: (!) 29 (!) 30  Temp: 99.4 F (37.4 C)   SpO2: 98% 97%    Intake/Output Summary (Last 24 hours) at 08/21/2023 1402 Last data filed at 08/21/2023 1200 Gross per 24 hour  Intake 712.5 ml  Output 850 ml  Net -137.5 ml   Last Weight  Most recent update: 08/21/2023  5:59 AM    Weight  72.4 kg (159 lb 9.8 oz)            Gen:  Elderly Caucasian F chronically ill appearing HEENT: NGT, dry mucous membranes CV: Regular rate and irregular rhythm PULM:On RA, breathing is non-labored   ABD: soft/nontender  EXT: Pedal edema  Neuro: Somnolent  SUMMARY OF RECOMMENDATIONS   DNAR/DNI   Allow time for outcomes  Open and honest conversations held with patients daughter, [name] regarding continuation of current care versus transition to comfort care   Patients two children and son in law plan to speak further tonight among themselves regarding options moving forward  Plan to meet with patients family tomorrow - a time has not yet been determined   Ongoing PMT support ______________________________________________________________________________________ Camille Cedars [facility] Palliative Medicine Team Team Cell Phone: [phone] Please utilize secure chat with additional questions, if there is no response within 30 minutes please call the above phone number  Time Spent: 65 Billing based on MDM: High

## 2023-08-21 NOTE — Progress Notes (Addendum)
 Pt mews yellow due to HR and RR , pt now has increased work of breathing with abdominal accessory muscles being used , RR 27 labored . Lung sounds diminished. Sats remain at 99% on [facility]. Pt is also more lethargic but answers to questions and follows commands. Provider on call Rathore notified , see new orders. Rapid response RN and charge RN notified of the above, will continue to assess and monitor . Continuous pulse ox applied.   08/21/23 2216  Assess: MEWS Score  Temp (!) 97.5 F (36.4 C)  BP 137/79  MAP (mmHg) 95  Pulse Rate (!) 102  Resp (!) 27  Level of Consciousness Alert  SpO2 99 %  O2 Device Nasal Cannula  O2 Flow Rate (L/min) 2 L/min  Assess: MEWS Score  MEWS Temp 0  MEWS Systolic 0  MEWS Pulse 1  MEWS RR 2  MEWS LOC 0  MEWS Score 3  MEWS Score Color Yellow  Assess: if the MEWS score is Yellow or Red  Were vital signs accurate and taken at a resting state? Yes  Does the patient meet 2 or more of the SIRS criteria? No  MEWS guidelines implemented  Yes, yellow  Treat  MEWS Interventions Considered administering scheduled or prn medications/treatments as ordered  Take Vital Signs  Increase Vital Sign Frequency  Yellow: Q2hr x1, continue Q4hrs until patient remains green for 12hrs  Escalate  MEWS: Escalate Yellow: Discuss with charge nurse and consider notifying provider and/or RRT  Notify: Charge Nurse/RN  Name of Charge Nurse/RN Notified [place] RN  Provider Notification  Provider Name/Title Rathore MD  Date Provider Notified 08/21/23  Notify: Rapid Response  Name of Rapid Response RN Notified David RN  Date Rapid Response Notified 08/21/23  Time Rapid Response Notified 2217  Assess: SIRS CRITERIA  SIRS Temperature  0  SIRS Respirations  1  SIRS Pulse 1  SIRS WBC 0  SIRS Score Sum  2

## 2023-08-21 NOTE — TOC Initial Note (Signed)
 Transition of Care [facility]) - Initial/Assessment Note    Patient Details  Name: [name] MRN: [id number] Date of Birth: 1930/12/13  Transition of Care [facility]) CM/SW Contact:    Dexter Forest, LCSW Phone Number: 08/21/2023, 2:31 PM  Clinical Narrative:                 CSW spoke with Sherrlyn Dolores at [facility] who states patient is from the facility as short term rehab. Patient was admitted to [facility] on 08/11/23 and was sent to [facility] on 08/18/23. Sherrlyn Dolores states patient may return if desired but a new authorization will be needed.  Per Palliative note, family to have additional goals of care conversation tomorrow.  TOC will continue following for discharge planning purposes.     Patient Goals and CMS Choice            Expected Discharge Plan and Services                                              Prior Living Arrangements/Services     Patient language and need for interpreter reviewed:: Yes        Need for Family Participation in Patient Care: No (Comment) Care giver support system in place?: Yes (comment)   Criminal Activity/Legal Involvement Pertinent to Current Situation/Hospitalization: No - Comment as needed  Activities of Daily Living      Permission Sought/Granted                  Emotional Assessment Appearance:: Appears stated age     Orientation: : Oriented to Self, Oriented to Place, Oriented to  Time, Oriented to Situation Alcohol  / Substance Use: Not Applicable Psych Involvement: No (comment)  Admission diagnosis:  Stroke (cerebrum) (HCC) [I63.9] Acute ischemic stroke (HCC) [I63.9] Acute CVA (cerebrovascular accident) [facility]) [I63.9] Patient Active Problem List   Diagnosis Date Noted   Stroke (cerebrum) (HCC) 08/19/2023   Acute CVA (cerebrovascular accident) (HCC) 08/19/2023   Closed fracture of right distal radius 08/06/2023   Dysphagia 05/14/2023   CHF (congestive heart failure) (HCC) 04/28/2023   CAP (community acquired  pneumonia) 04/28/2023   Pressure ulcer 08/10/2022   Tibia/fibula fracture 06/26/2022   Fall at home, initial encounter 06/26/2022   Chronic anticoagulation 06/26/2022   Essential hypertension 06/26/2022   Paresthesia 06/19/2022   PAD (peripheral artery disease) (HCC) 06/19/2022   Foot pain 04/24/2022   GERD (gastroesophageal reflux disease) 12/18/2020   Closed right ankle fracture 09/22/2020   Ankle fracture, right 09/22/2020   Recurrent dislocation of patella, right knee 06/16/2020   Primary osteoarthritis of right knee 02/18/2020   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 12/03/2019   Left knee pain 12/03/2019   Moderate to severe pulmonary hypertension (HCC) 11/26/2019   Tremor of both hands 09/29/2019   Dysuria 08/06/2019   Severe tricuspid regurgitation    S/P hip hemiarthroplasty 08/04/2019   Chronic diarrhea 09/07/2017   Hoarseness of voice 09/07/2017   Total knee replacement status, left 04/25/17 05/06/2017   Primary osteoarthritis of left knee 04/25/2017   DJD (degenerative joint disease) 04/25/2017   Healthcare maintenance 02/08/2017   Vitamin D  deficiency 02/08/2017   Microscopic colitis 09/27/2016   IBS (irritable bowel syndrome) 08/31/2016   At risk for falling 01/07/2016   Cystitis 07/10/2015   Chronic combined systolic and diastolic heart failure (HCC)  Rectus sheath hematoma- no anticoagulation    Atrial fibrillation with RVR- CHADs VASc=4 06/18/2015   Advance care planning 10/23/2014   Leg edema 06/27/2012   Medicare annual wellness visit, subsequent 03/27/2012   Anxiety and depression 03/05/2012   Osteoporosis 05/23/2010   KNEE PAIN, LEFT 03/02/2010   LBBB (left bundle branch block) 10/28/2008   POST MI SEPTAL DEFECT 06/24/2008   Cough 06/24/2008   Nonischemic cardiomyopathy (HCC) 05/30/2008   ATRIAL FIBRILLATION 05/30/2008   PCP:  Donnie Galea, MD Pharmacy:   [facility] [phone] - [address], [place] - [address]  [place] [address] Phone: [phone] Fax: [phone]     Social Drivers of Health (SDOH) Social History: SDOH Screenings   Food Insecurity: No Food Insecurity (08/06/2023)  Housing: Low Risk  (08/06/2023)  Transportation Needs: No Transportation Needs (08/06/2023)  Utilities: Not At Risk (08/06/2023)  Alcohol  Screen: Low Risk  (08/11/2022)  Depression (PHQ2-9): High Risk (05/12/2023)  Financial Resource Strain: Low Risk  (07/12/2021)  Physical Activity: Sufficiently Active (08/11/2022)  Social Connections: Moderately Integrated (08/06/2023)  Stress: No Stress Concern Present (08/11/2022)  Tobacco Use: Low Risk  (08/19/2023)   SDOH Interventions:     Readmission Risk Interventions     No data to display

## 2023-08-21 NOTE — Progress Notes (Signed)
  Echocardiogram 2D Echocardiogram has been performed.  Keiran Sias L Shaima Sardinas RDCS 08/21/2023, 9:47 AM

## 2023-08-21 NOTE — Progress Notes (Signed)
 eLink Physician-Brief Progress Note Patient Name: [name] DOB: 1930-04-16 MRN: [id number]   Date of Service  08/21/2023  HPI/Events of Note  Has some arn pain. Tylneol did not help. Creat is ok and not on any AC. No bleeding from anywhere. Is on a  PPI  eICU Interventions  Trying toradol  x 1      Intervention Category Intermediate Interventions: Pain - evaluation and management  [name] 08/21/2023, 1:00 AM

## 2023-08-21 NOTE — Progress Notes (Signed)
 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  Some hemorrhagic transformation seen on MRI.  Patient extubated later in the afternoon.  INTERIM HISTORY/SUBJECTIVE No family at the bedside.  Patient drowsy sleepy but arousable.  Still has A-fib RVR, increase metoprolol  dose to home medication dosing.  Still has left-sided weakness.  Right arm in dressing for fracture.  OBJECTIVE  CBC    Component Value Date/Time   WBC 8.3 08/21/2023 0652   RBC 3.25 (L) 08/21/2023 0652   HGB 10.4 (L) 08/21/2023 0652   HGB 12.5 02/05/2018 0937   HCT 32.4 (L) 08/21/2023 0652   HCT 38.4 02/05/2018 0937   PLT 249 08/21/2023 0652   PLT 320 02/05/2018 0937   MCV 99.7 08/21/2023 0652   MCV 92 02/05/2018 0937   MCH 32.0 08/21/2023 0652   MCHC 32.1 08/21/2023 0652   RDW 13.3 08/21/2023 0652   RDW 12.3 02/05/2018 0937   LYMPHSABS 1.7 08/19/2023 0014   LYMPHSABS 1.7 02/05/2018 0937   MONOABS 0.7 08/19/2023 0014   EOSABS 0.2 08/19/2023 0014   EOSABS 0.2 02/05/2018 0937   BASOSABS 0.1 08/19/2023 0014   BASOSABS 0.1 02/05/2018 0937    BMET    Component Value Date/Time   NA 140 08/21/2023 0652   NA 136 05/12/2022 1159   K 3.7 08/21/2023 0652   CL 108 08/21/2023 0652   CO2 23 08/21/2023 0652   GLUCOSE 137 (H) 08/21/2023 0652   BUN 16 08/21/2023 0652   BUN 20 05/12/2022 1159   CREATININE 0.71 08/21/2023 0652   CREATININE 0.83 07/07/2022 1540   CALCIUM  9.6 08/21/2023 0652   EGFR 75 05/12/2022 1159   GFRNONAA >60 08/21/2023 0652    IMAGING past 24 hours No results found.   Vitals:   08/21/23 0800 08/21/23 0900 08/21/23 1000 08/21/23 1100  BP: 113/87 138/71 117/83 (!) 129/92  Pulse: (!) 157 84 93 (!) 105  Resp: (!) 26 (!) 23 (!) 32 (!) 32  Temp: 100.3 F (37.9 C)     TempSrc: Axillary     SpO2:  (!) 88% 99% 94% 95%  Weight:      Height:         PHYSICAL EXAM General: Elderly patient in no acute distress, cast on right upper extremity CV: irregularly irregular heart rate and rhythm. Musculoskeleton: RUE in brace due to fracture  NEURO: lethargic, but eyes open on voice, orientated to age, place, time and situation. No aphasia but paucity of language, following all simple commands, mild dysarthria. Able to name and repeat in dysarthric voice. No gaze palsy, tracking bilaterally, blinking to visual threat bilaterally, PERRL. Left facial droop. Tongue midline. LUE 3-/5 and drift to bed within 10 sec and LLE hemiplegia, RUE against gravity but mild drift due to pain from fracture. LLE flaccid, and RLE 2+/5 but not able to against gravity. Sensation symmetrical bilaterally on the face and arms but decreased sensation on the LLE comparing with RLE, L FTN incomplete and R FTN not able to test due to R arm fracture with brace, gait not tested.      ASSESSMENT/PLAN  [name] is a 89 y.o. female with history of A-fib on Eliquis  and recent fall with right arm fracture admitted for acute onset left-sided weakness.  She was found to have a right MCA occlusion and was  taken to interventional radiology for successful mechanical thrombectomy.  Follow-up MRI did demonstrate some hemorrhagic transformation, and Eliquis  was held.  Will obtain follow-up CT tomorrow morning and then discuss reinitiation of anticoagulation.  Patient was extubated in the afternoon of 5/10.  NIH on Admission 17  Stroke:  right MCA territory, and punctate right cerebellum left pontine infarcts with right M1 occlusion s/p IR with TICI2b, etiology: Likely A-fib and cardiomyopathy with low EF with underdosed Eliquis  Code Stroke CT head No acute abnormality. Small vessel disease. Atrophy. ASPECTS 10.    CTA head & neck acute occlusion of right MCA proximal M1 segment CT perfusion 35 mL right sided ischemic core with 92  mL penumbra MRI acute right MCA territory infarct primarily involving right temporal lobe and right basal ganglia, prominent susceptibility in caudate and lentiform nucleus concerning for development of hemorrhagic conversion, additional foci of susceptibility in anterior inferior right temporal lobe which may also reflect hemorrhagic conversion Follow-up CT 5/11: Stable BG and caudate hemorrhagic termination 2D Echo EF 25 to 30% LDL 35 HgbA1c 4.3 VTE prophylaxis -SCDs Eliquis  (apixaban ) daily prior to admission, now on aspirin 81 mg daily secondary to hemorrhagic transformation.  Plan to restart heparin  IV in a few days.  Will transition to full dose Eliquis  once no procedure planned Therapy recommendations: SNF Disposition: pending  Atrial fibrillation with RVR Home Meds: Eliquis  2.5 mg twice daily, metoprolol  XL 200 mg daily, digoxin  0.125 mg Monday Wednesday Friday Patient stated compliance with medication Eliquis  dose likely underdosed at 2.5 mg twice daily Currently on aspirin 81 due to hemorrhagic transformation Plan for restart heparin  IV in a few days, will transition to full dose Eliquis  once no procedure planned Increase metoprolol  to 50 mg every 6 hours.  Cardiomyopathy 2D echo EF 25 to 30% On metoprolol  and digoxin  Continue outpatient follow-up with cardiology  History of hypertension hypotensive 5/11 Home meds: None Off pressor BP stable now Long-term BP goal normotensive  Hyperlipidemia Home meds: None LDL 35, goal < 70 High intensity statin not indicated as LDL below goal  Dysphagia Patient has post-stroke dysphagia SLP on board NPO On TF after cortrak placed  Other Stroke Risk Factors Advanced age  Other Active Problems Right arm fracture, continue PT/OT when able  Hospital day # 2  This patient is critically ill due to stroke status post mechanical thrombectomy, A-fib RVR, cardiomyopathy and at significant risk of neurological worsening, death form  heart failure, cardiac arrest, recurrent stroke, hemorrhagic transmission. This patient's care requires constant monitoring of vital signs, hemodynamics, respiratory and cardiac monitoring, review of multiple databases, neurological assessment, discussion with family, other specialists and medical decision making of high complexity. I spent 40 minutes of neurocritical care time in the care of this patient.  Consuelo Denmark, MD PhD Stroke Neurology 08/21/2023 7:29 PM    To contact Stroke Continuity provider, please refer to [email or web address]. After hours, contact General Neurology

## 2023-08-21 NOTE — Progress Notes (Signed)
 [name]  KVQ:[id number] DOB: 1931/03/25 DOA: 08/19/2023 PCP: Donnie Galea, MD    Brief Narrative:  89 year old with a history of atrial fibrillation on chronic Eliquis , nonischemic cardiomyopathy with chronic systolic CHF, LBBB, depression/anxiety, and a right distal radius fracture requiring ORIF with discharge from the hospital 08/11/2023 who returned to the ER 5/10 from her SNF when she was found slumped over in a chair.  EMS noted left-sided facial droop and left arm weakness with left neglect.  CT head revealed no acute abnormality. CTa head and neck was positive for an acute occlusion of the right MCA at the proximal right M1 segment with a 35 mL acute infarct involving the right frontotemporal region.  She was taken to IR emergently and underwent a right M1 thrombectomy.  Significant Events: 5/10 admit to ICU after intubated for emergent R M1 thrombectomy for acute R frontotemporal CVA  5/10 afternoon - extubated 5/12 [facility] assumed care   Goals of Care:   Code Status: Limited: Do not attempt resuscitation (DNR) -DNR-LIMITED -Do Not Intubate/DNI    DVT prophylaxis: SCDs Start: 08/19/23 0406   Interim Hx: Patient encountered some RVR this morning, which has improved with extra doses of BB.  Vitals are otherwise stable.  She is afebrile.  She is alert and conversant at the time of my visit but mildly confused.  She denies any pain.  She denies shortness of breath.  She cannot tell me where she is or why she is here.  Assessment & Plan:  Acute R frontotemporal ischemic CVA -status post emergent R M1 thrombectomy Care per Neurology -felt to have likely been cardioembolic in origin -TTE notes EF 25-30% with global LV hypokinesis and a mildly enlarged RV with no significant valvular abnormalities -medical treatment: 81 mg aspirin alone for now in setting of hemorrhagic transformation -eventual resumption of Eliquis  -LDL 35 -A1c 4.3 -PT/OT/SLP - Neurology following  Dysphagia  due to CVA SLP following - remains n.p.o. with Cortrak dependence at present -family working with Palliative Care to determine if patient would want long-term feeding tube  Permanent atrial fibrillation on chronic Eliquis  - acute RVR  Adjusting rate controlling meds to achieve better heart rate control -Eliquis  presently on hold due to hemorrhagic conversion of CVAs  Chronic systolic congestive heart failure without acute exacerbation TTE 2024 noted EF 25-30% -EF stable on follow-up TTE this admission -no clinical evidence of gross volume overload at this time  Recent R distal radial fracture Status post ORIF -was in SNF rehab at time of CVA  Goals of Care Family is meeting with the palliative care team to consider ultimate goals of care, and specifically to address whether patient would desire long-term feeding tube   Family Communication: No family present at time of my exam Disposition: Unclear at present   Objective: Blood pressure 138/71, pulse 84, temperature 100.3 F (37.9 C), temperature source Axillary, resp. rate (!) 23, height 5\' 6"  (1.676 m), weight 72.4 kg, SpO2 99%.  Intake/Output Summary (Last 24 hours) at 08/21/2023 1015 Last data filed at 08/21/2023 1000 Gross per 24 hour  Intake 547.17 ml  Output 850 ml  Net -302.83 ml   Filed Weights   08/19/23 0345 08/20/23 0500 08/21/23 0500  Weight: 73 kg 74.3 kg 72.4 kg    Examination: General: No acute respiratory distress Lungs: Clear to auscultation bilaterally without wheezes or crackles Cardiovascular: Irregularly irregular, heart rate 90 at time of my visit -no murmur or rub Abdomen: Nontender, nondistended, soft, bowel  sounds positive, no rebound, no ascites, no appreciable mass Extremities: No significant cyanosis, clubbing, or edema bilateral lower extremities  CBC: Recent Labs  Lab 08/19/23 0014 08/19/23 0022 08/19/23 0912 08/20/23 1008 08/21/23 0652  WBC 9.3  --  10.6* 9.6 8.3  NEUTROABS 6.6  --   --    --   --   HGB 11.5*   < > 10.4* 9.5* 10.4*  HCT 35.8*   < > 32.2* 30.3* 32.4*  MCV 100.6*  --  99.7 102.7* 99.7  PLT 332  --  296 242 249   < > = values in this interval not displayed.   Basic Metabolic Panel: Recent Labs  Lab 08/19/23 0912 08/20/23 1008 08/20/23 1021 08/20/23 1647 08/21/23 0652  NA 137 139  --   --  140  K 4.1 3.6  --   --  3.7  CL 101 107  --   --  108  CO2 24 23  --   --  23  GLUCOSE 102* 100*  --   --  137*  BUN 15 9  --   --  16  CREATININE 0.72 0.65  --   --  0.71  CALCIUM  8.8* 8.6*  --   --  9.6  MG 1.9  --  1.9 2.0 2.1  PHOS 3.2  --  2.3* 2.3* 2.6   GFR: Estimated Creatinine Clearance: 47.6 mL/min (by C-G formula based on SCr of 0.71 mg/dL).   Scheduled Meds:  aspirin  81 mg Per Tube Daily   Chlorhexidine  Gluconate Cloth  6 each Topical Daily   digoxin   0.125 mg Per Tube Q M,W,F   feeding supplement (PROSource TF20)  60 mL Per Tube Daily   insulin  aspart  0-15 Units Subcutaneous Q4H   metoprolol  tartrate  50 mg Per Tube Q6H   multivitamin with minerals  1 tablet Per Tube Daily   mouth rinse  15 mL Mouth Rinse 4 times per day   pantoprazole  (PROTONIX ) IV  40 mg Intravenous QHS   venlafaxine  XR  75 mg Oral q morning   Continuous Infusions:  feeding supplement (OSMOLITE 1.2 CAL) 40 mL/hr at 08/21/23 0800     LOS: 2 days   Abbe Abate, MD [facility] Office  [phone] Pager - Text Page per Tilford Foley  If 7PM-7AM, please contact night-coverage per Amion 08/21/2023, 10:15 AM

## 2023-08-21 NOTE — Plan of Care (Signed)
 Patient is this AM with heart rate 120-140, given PRN dose of 2.5 mg metoprolol  without effect, given additional 2.5 mg IV. Increased scheduled metoprolol  to 50 mg q6h (previously on 50 mg BID, home dose 200 mg ER). Dr. Jonelle Neri notified of changes.

## 2023-08-21 NOTE — Progress Notes (Signed)
 Speech Language Pathology Treatment: Dysphagia  Patient Details Name: [name] MRN: [id number] DOB: 1931/03/27 Today's Date: 08/21/2023 Time: 5188-4166 SLP Time Calculation (min) (ACUTE ONLY): 9 min  Assessment / Plan / Recommendation Clinical Impression  Pt is more alert and interactive today compared to initial eval on previous date. She readily accepts ice chips, chewing them and swallowing them without overt difficulty. She also swallowed small amounts of puree. When presented with a spoonful of water  there was some throat clearing, and with one sip of water , there is a strong, explosive cough response with pt expectorating what looks like most of the bolus anteriorly out of her oral cavity. Pt says she "gets strangled" like this at home but suspect that this is exacerbated. Recommend starting at least ice chips today with emphasis on oral hygiene. Per chart and conversation with RN, it sounds like family is leaning toward no long-term feeding tubes. SLP will f/u to see how ice chips go, and if she is ready for more POs, would family want to pursue further testing vs allowing more comfort focused feeds based on clinical presentation alone.   HPI HPI: Pt is a 89 y.o. female presenting 5/10 from [facility] with AMS after being found slumped over in chair with L sided weakness. Found to have R M1 occlusion. S/p IR with successful thrombectomy 5/10 with "some hemorrhagic conversion." Extubated later on 5/10. PMH significant for GERD,  a-fib on Eliquis , non-ischemic cardiomyopathy, LBBB, chronic HFrEF, depression, anxiety, recent admission with R distal radius fx s/p ORIF. Pt has documented trouble swallowing as far back as 2010 per chart, although has never pursued testing or SLP when offered.      SLP Plan  Continue with current plan of care      Recommendations for follow up therapy are one component of a multi-disciplinary discharge planning process, led by the attending  physician.  Recommendations may be updated based on patient status, additional functional criteria and insurance authorization.    Recommendations  Diet recommendations: NPO;Other(comment) (ice chips after oral care) Medication Administration: Via alternative means                  Oral care QID;Oral care prior to ice chip/H20     Dysphagia, unspecified (R13.10)     Continue with current plan of care     Beth Brooke., M.A. CCC-SLP Acute Rehabilitation Services Office: [phone]  Secure chat preferred   08/21/2023, 3:08 PM

## 2023-08-21 NOTE — Evaluation (Signed)
 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/2023   Acute CVA (cerebrovascular accident) (HCC) 08/19/2023   Closed fracture of right distal radius 08/06/2023   Dysphagia 05/14/2023   CHF (congestive heart failure) (HCC) 04/28/2023   CAP (community acquired pneumonia) 04/28/2023   Pressure ulcer 08/10/2022   Tibia/fibula fracture 06/26/2022   Fall at home, initial encounter 06/26/2022   Chronic anticoagulation 06/26/2022   Essential hypertension 06/26/2022   Paresthesia 06/19/2022   PAD (peripheral artery disease) (HCC) 06/19/2022   Foot pain 04/24/2022   GERD (gastroesophageal reflux disease) 12/18/2020   Closed right ankle fracture 09/22/2020   Ankle fracture, right 09/22/2020   Recurrent dislocation of patella, right knee 06/16/2020   Primary osteoarthritis of right knee 02/18/2020   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 12/03/2019   Left knee pain 12/03/2019   Moderate to severe pulmonary hypertension (HCC) 11/26/2019   Tremor of both hands 09/29/2019   Dysuria 08/06/2019   Severe tricuspid regurgitation    S/P hip hemiarthroplasty 08/04/2019   Chronic diarrhea 09/07/2017   Hoarseness of voice 09/07/2017   Total knee replacement status, left 04/25/17 05/06/2017   Primary osteoarthritis of left knee 04/25/2017   DJD (degenerative joint disease) 04/25/2017   Healthcare maintenance 02/08/2017   Vitamin D  deficiency 02/08/2017   Microscopic colitis 09/27/2016   IBS (irritable bowel syndrome) 08/31/2016   At risk for falling 01/07/2016   Cystitis 07/10/2015   Chronic combined systolic and diastolic heart failure (HCC)    Rectus sheath hematoma- no anticoagulation    Atrial fibrillation with RVR- CHADs  VASc=4 06/18/2015   Advance care planning 10/23/2014   Leg edema 06/27/2012   Medicare annual wellness visit, subsequent 03/27/2012   Anxiety and depression 03/05/2012   Osteoporosis 05/23/2010   KNEE PAIN, LEFT 03/02/2010   LBBB (left bundle branch block) 10/28/2008   POST MI SEPTAL DEFECT 06/24/2008   Cough 06/24/2008   Nonischemic cardiomyopathy (HCC) 05/30/2008   ATRIAL FIBRILLATION 05/30/2008   Past Medical History:  Past Medical History:  Diagnosis Date   Anemia    Anxiety    Arthritis    "knees" (06/18/2015)   Atrial fibrillation with RVR (HCC) 06/18/2015   h/o sig bleed on coumadin   Cardiogenic shock 05/02/2017   Complication of anesthesia 2010   "w/cardiac cath; got into a psychotic state for ~ 3 days; got me out w/Valium"   Depression    "I have it off and on; not as often as I've gotten older" (06/18/2015)   Diverticulosis    descending and sigmoid colon--Dr. Adan Holms   DJD (degenerative joint disease) of knee    left   Dyspnea    with exertion and in morning mostly when wakes up    E coli infection    History of   GERD (gastroesophageal reflux disease)    History of blood transfusion    History of Clostridium difficile colitis    IBS (irritable bowel syndrome)    Insomnia     off chronic ambien  5mg  as of 10/11, rare/episodic use since then  DCM/CHF   LBBB (left bundle branch block)    Lymphocytic colitis 09/27/2016   Moderate to severe pulmonary hypertension (HCC) 11/26/2019   Nonischemic cardiomyopathy (HCC)    normal coronaries, EF  15-20% 04/2008, EF 50-55% 2015   Osteoporosis    on DXA 05/2010   Positive PPD    Sciatica    Right   Urinary incontinence    Occassionally   Past Surgical History:  Past Surgical History:  Procedure Laterality Date   ANTERIOR APPROACH HEMI HIP ARTHROPLASTY Left 08/05/2019   Procedure: ANTERIOR APPROACH HEMI HIP ARTHROPLASTY;  Surgeon: Neil Balls, MD;  Location: [facility] OR;  Service: Orthopedics;  Laterality: Left;   BIOPSY   11/22/2017   Procedure: BIOPSY;  Surgeon: Kenney Peacemaker, MD;  Location: [facility] ENDOSCOPY;  Service: Endoscopy;;   CARDIAC CATHETERIZATION  2010   CARDIOVERSION N/A 06/19/2015   Procedure: CARDIOVERSION;  Surgeon: Darlis Eisenmenger, MD;  Location: [facility] ENDOSCOPY;  Service: Cardiovascular;  Laterality: N/A;   CATARACT EXTRACTION W/ INTRAOCULAR LENS  IMPLANT, BILATERAL Bilateral    COLONOSCOPY WITH PROPOFOL  N/A 11/22/2017   Procedure: COLONOSCOPY WITH PROPOFOL ;  Surgeon: Kenney Peacemaker, MD;  Location: [facility] ENDOSCOPY;  Service: Endoscopy;  Laterality: N/A;   DILATION AND CURETTAGE OF UTERUS     IR CT HEAD LTD  08/19/2023   IR PERCUTANEOUS ART THROMBECTOMY/INFUSION INTRACRANIAL INC DIAG ANGIO  08/19/2023   IR US  GUIDE VASC ACCESS RIGHT  08/19/2023   OPEN REDUCTION INTERNAL FIXATION (ORIF) DISTAL RADIAL FRACTURE Right 08/09/2023   Procedure: OPEN REDUCTION INTERNAL FIXATION (ORIF) DISTAL RADIUS FRACTURE;  Surgeon: Arvil Birks, MD;  Location: [facility] OR;  Service: Orthopedics;  Laterality: Right;   ORIF ANKLE FRACTURE Right 09/22/2020   Procedure: RIGHT ANKLE OPEN REDUCTION INTERNAL FIXATION (ORIF);  Surgeon: Dayne Even, MD;  Location: [facility] ORS;  Service: Orthopedics;  Laterality: Right;   ORIF TIBIA PLATEAU Right 06/28/2022   Procedure: OPEN REDUCTION INTERNAL FIXATION (ORIF) TIBIA;  Surgeon: Laneta Pintos, MD;  Location: [facility] OR;  Service: Orthopedics;  Laterality: Right;   RADIOLOGY WITH ANESTHESIA N/A 08/19/2023   Procedure: RADIOLOGY WITH ANESTHESIA;  Surgeon: Radiologist, Medication, MD;  Location: [facility] OR;  Service: Radiology;  Laterality: N/A;   TEE WITHOUT CARDIOVERSION N/A 06/19/2015   Procedure: TRANSESOPHAGEAL ECHOCARDIOGRAM (TEE);  Surgeon: Darlis Eisenmenger, MD;  Location: [facility] ENDOSCOPY;  Service: Cardiovascular;  Laterality: N/A;   TOTAL KNEE ARTHROPLASTY Left 04/25/2017   Procedure: TOTAL KNEE ARTHROPLASTY;  Surgeon: Dayne Even, MD;  Location: [facility] OR;  Service: Orthopedics;  Laterality: Left;   TOTAL  KNEE ARTHROPLASTY Right 02/18/2020   Procedure: RIGHT TOTAL KNEE ARTHROPLASTY;  Surgeon: Dayne Even, MD;  Location: [facility] ORS;  Service: Orthopedics;  Laterality: Right;   TOTAL KNEE ARTHROPLASTY Right 06/16/2020   Procedure: RIGHT RETINACULAR REPAIR;  Surgeon: Dayne Even, MD;  Location: [facility] ORS;  Service: Orthopedics;  Laterality: Right;   TOTAL KNEE REVISION Left 12/03/2019   Procedure: LEFT TOTAL KNEE REVISION  OF POYLY EXCHANGE;  Surgeon: Dayne Even, MD;  Location: [facility] ORS;  Service: Orthopedics;  Laterality: Left;   VAGINAL HYSTERECTOMY  1979   ovaries intact   HPI:  Pt is a 89 y.o. female presenting 5/10 from [facility] with AMS after being found slumped over in chair with L sided weakness. Found to have R M1 occlusion. S/p IR with successful thrombectomy 5/10 with "some hemorrhagic conversion." Extubated later on 5/10. PMH significant for GERD,  a-fib on Eliquis , non-ischemic cardiomyopathy, LBBB, chronic HFrEF, depression, anxiety, recent admission with R distal radius fx s/p ORIF. Pt has documented trouble swallowing as far back as 2010 per chart, although has never pursued testing or SLP when offered.   Assessment / Plan /  Recommendation Clinical Impression  Pt is drowsy but more alert today. She is oriented x4 but still showing some confusion as well as reduced awareness. She acknowledges that she has had a stroke, but says that she has no symptoms or acute changes as a result. Online awareness not improved with pt not identifying errors during planned failures. Cueing was needed for simple problem solving as well. Pt's speech is dysarthric with imprecise articulation and reduced breath support. Expressive language seems to be appropriate though and receptively she follows most commands. Pt would need additional supervision/assistance post-discharge and would benefit from ongoing SLP f/u as in line with overall GOC.      SLP Assessment  SLP Recommendation/Assessment:  Patient needs continued Speech Lanaguage Pathology Services SLP Visit Diagnosis: Dysarthria and anarthria (R47.1);Cognitive communication deficit (R41.841)    Recommendations for follow up therapy are one component of a multi-disciplinary discharge planning process, led by the attending physician.  Recommendations may be updated based on patient status, additional functional criteria and insurance authorization.    Follow Up Recommendations  Skilled nursing-short term rehab (<3 hours/day)    Assistance Recommended at Discharge  Frequent or constant Supervision/Assistance  Functional Status Assessment Patient has had a recent decline in their functional status and demonstrates the ability to make significant improvements in function in a reasonable and predictable amount of time.  Frequency and Duration min 2x/week  2 weeks      SLP Evaluation Cognition  Overall Cognitive Status: No family/caregiver present to determine baseline cognitive functioning Arousal/Alertness: Awake/alert (little sleepy, but more alert than yesterday) Orientation Level: Oriented X4 Attention: Sustained Sustained Attention: Impaired Sustained Attention Impairment: Verbal basic;Functional basic Memory: Impaired Memory Impairment: Decreased recall of new information Awareness: Impaired Awareness Impairment: Intellectual impairment;Other (comment) (online) Problem Solving: Impaired Problem Solving Impairment: Functional basic Safety/Judgment: Impaired       Comprehension  Auditory Comprehension Overall Auditory Comprehension: Appears within functional limits for tasks assessed    Expression Expression Primary Mode of Expression: Verbal Verbal Expression Overall Verbal Expression: Appears within functional limits for tasks assessed   Oral / Motor  Oral Motor/Sensory Function Overall Oral Motor/Sensory Function: Moderate impairment Facial ROM: Reduced left;Suspected CN VII (facial) dysfunction Facial  Symmetry: Abnormal symmetry left;Suspected CN VII (facial) dysfunction Facial Strength: Reduced left;Suspected CN VII (facial) dysfunction Lingual ROM: Reduced right;Reduced left Lingual Symmetry: Within Functional Limits Lingual Strength: Reduced Motor Speech Overall Motor Speech: Impaired Respiration: Impaired Level of Impairment: Sentence Phonation: Normal Resonance: Within functional limits Articulation: Impaired Level of Impairment: Sentence Intelligibility: Intelligibility reduced Sentence: 75-100% accurate            Beth Brooke., M.A. CCC-SLP Acute Rehabilitation Services Office: [phone]  Secure chat preferred  08/21/2023, 3:18 PM

## 2023-08-21 NOTE — Progress Notes (Signed)
 HR was 160s sustained , gave prn IV metop hr now in the 90s,Pt states she felt a little sob during episode sats were 96%on RA, put [facility] on for comfort states that breathing was improved with 02 , provider notified, see new order.

## 2023-08-21 NOTE — Progress Notes (Signed)
 MD Christiane Cowing alerted that patient was becoming increasingly confused.  Now only oriented to self.  Orders were given for stat EKG for QTC.  Subsequently MD ordered bed time dose of seroquel.

## 2023-08-21 NOTE — Procedures (Signed)
 Cortrak  Person Inserting Tube:  Kimyah Frein, Kerstin Peeling, RD Tube Type:  Cortrak - 43 inches Tube Size:  10 Tube Location:  Left nare Initial Placement:  Stomach Secured by: Bridle Technique Used to Measure Tube Placement:  Marking at nare/corner of mouth Cortrak Secured At:  67 cm   Cortrak Tube Team Note:  Consult received to place a Cortrak feeding tube.   No x-ray is required. RN may begin using tube.   If the tube becomes dislodged please keep the tube and contact the Cortrak team at [email or web address] for replacement.  If after hours and replacement cannot be delayed, place a NG tube and confirm placement with an abdominal x-ray.    Frederik Jansky, RD Registered Dietitian  See Amion for more information

## 2023-08-22 DIAGNOSIS — Z515 Encounter for palliative care: Secondary | ICD-10-CM | POA: Diagnosis not present

## 2023-08-22 DIAGNOSIS — Z7189 Other specified counseling: Secondary | ICD-10-CM | POA: Diagnosis not present

## 2023-08-22 DIAGNOSIS — I63411 Cerebral infarction due to embolism of right middle cerebral artery: Secondary | ICD-10-CM | POA: Diagnosis not present

## 2023-08-22 LAB — CBC
HCT: 32.6 % — ABNORMAL LOW (ref 36.0–46.0)
Hemoglobin: 10.6 g/dL — ABNORMAL LOW (ref 12.0–15.0)
MCH: 32.3 pg (ref 26.0–34.0)
MCHC: 32.5 g/dL (ref 30.0–36.0)
MCV: 99.4 fL (ref 80.0–100.0)
Platelets: 280 10*3/uL (ref 150–400)
RBC: 3.28 MIL/uL — ABNORMAL LOW (ref 3.87–5.11)
RDW: 13.4 % (ref 11.5–15.5)
WBC: 10.4 10*3/uL (ref 4.0–10.5)
nRBC: 0 % (ref 0.0–0.2)

## 2023-08-22 LAB — BASIC METABOLIC PANEL WITH GFR
Anion gap: 9 (ref 5–15)
BUN: 18 mg/dL (ref 8–23)
CO2: 26 mmol/L (ref 22–32)
Calcium: 9.2 mg/dL (ref 8.9–10.3)
Chloride: 104 mmol/L (ref 98–111)
Creatinine, Ser: 0.69 mg/dL (ref 0.44–1.00)
GFR, Estimated: >60 mL/min (ref >60)
Glucose, Bld: 132 mg/dL — ABNORMAL HIGH (ref 70–99)
Potassium: 3.8 mmol/L (ref 3.5–5.1)
Sodium: 139 mmol/L (ref 135–145)

## 2023-08-22 LAB — GLUCOSE, CAPILLARY
Glucose-Capillary: 146 mg/dL — ABNORMAL HIGH (ref 70–99)
Glucose-Capillary: 150 mg/dL — ABNORMAL HIGH (ref 70–99)

## 2023-08-22 LAB — TSH: TSH: 1.181 u[IU]/mL (ref 0.350–4.500)

## 2023-08-22 MED ORDER — GLYCOPYRROLATE 1 MG PO TABS: 1.0000 mg | ORAL_TABLET | ORAL | Status: DC | PRN

## 2023-08-22 MED ORDER — TORSEMIDE 20 MG PO TABS: 20.0000 mg | ORAL_TABLET | Freq: Every day | ORAL | Status: DC

## 2023-08-22 MED ORDER — ACETAMINOPHEN 160 MG/5ML PO SOLN: 650.0000 mg | Freq: Four times a day (QID) | ORAL | Status: DC | PRN

## 2023-08-22 MED ORDER — TORSEMIDE 20 MG PO TABS
20.0000 mg | ORAL_TABLET | Freq: Every day | ORAL | Status: DC
  Administered 2023-08-22: 20 mg
  Filled 2023-08-22: qty 1

## 2023-08-22 MED ORDER — ONDANSETRON HCL 4 MG/2ML IJ SOLN: 4.0000 mg | Freq: Four times a day (QID) | INTRAMUSCULAR | Status: DC | PRN

## 2023-08-22 MED ORDER — MORPHINE SULFATE (PF) 2 MG/ML IV SOLN
2.0000 mg | Freq: Four times a day (QID) | INTRAVENOUS | Status: DC
Start: 2023-08-22 — End: 2023-08-30

## 2023-08-22 MED ORDER — LORAZEPAM 2 MG/ML IJ SOLN
0.5000 mg | INTRAMUSCULAR | Status: DC | PRN
Start: 2023-08-22 — End: 2023-08-30

## 2023-08-22 MED ORDER — GLYCOPYRROLATE 0.2 MG/ML IJ SOLN: 0.2000 mg | INTRAMUSCULAR | Status: DC | PRN

## 2023-08-22 MED ORDER — MORPHINE SULFATE (PF) 2 MG/ML IV SOLN
2.0000 mg | Freq: Four times a day (QID) | INTRAVENOUS | Status: DC
  Administered 2023-08-22: 2 mg via INTRAVENOUS
  Filled 2023-08-22: qty 1

## 2023-08-22 MED ORDER — BIOTENE DRY MOUTH MT LIQD: 15.0000 mL | OROMUCOSAL | Status: DC | PRN

## 2023-08-22 MED ORDER — POLYVINYL ALCOHOL 1.4 % OP SOLN: 1.0000 [drp] | Freq: Four times a day (QID) | OPHTHALMIC | Status: DC | PRN

## 2023-08-22 MED ORDER — LORAZEPAM 2 MG/ML IJ SOLN: 0.5000 mg | INTRAMUSCULAR | Status: DC | PRN

## 2023-08-22 MED ORDER — ACETAMINOPHEN 650 MG RE SUPP: 650.0000 mg | RECTAL | Status: DC | PRN

## 2023-08-22 MED ORDER — MORPHINE SULFATE (PF) 2 MG/ML IV SOLN
2.0000 mg | INTRAVENOUS | Status: DC | PRN
Start: 2023-08-22 — End: 2023-08-30

## 2023-08-22 MED ORDER — MORPHINE SULFATE (PF) 2 MG/ML IV SOLN: 2.0000 mg | INTRAVENOUS | Status: DC | PRN

## 2023-08-22 NOTE — Plan of Care (Signed)
 Problem: Education: Goal: Knowledge of disease or condition will improve Outcome: Progressing Goal: Knowledge of secondary prevention will improve (MUST DOCUMENT ALL) Outcome: Progressing Goal: Knowledge of patient specific risk factors will improve (DELETE if not current risk factor) Outcome: Progressing   Problem: Ischemic Stroke/TIA Tissue Perfusion: Goal: Complications of ischemic stroke/TIA will be minimized Outcome: Progressing   Problem: Coping: Goal: Will verbalize positive feelings about self Outcome: Progressing Goal: Will identify appropriate support needs Outcome: Progressing   Problem: Health Behavior/Discharge Planning: Goal: Ability to manage health-related needs will improve Outcome: Progressing Goal: Goals will be collaboratively established with patient/family Outcome: Progressing   Problem: Self-Care: Goal: Ability to participate in self-care as condition permits will improve Outcome: Progressing Goal: Verbalization of feelings and concerns over difficulty with self-care will improve Outcome: Progressing Goal: Ability to communicate needs accurately will improve Outcome: Progressing   Problem: Nutrition: Goal: Risk of aspiration will decrease Outcome: Progressing Goal: Dietary intake will improve Outcome: Progressing   Problem: Education: Goal: Understanding of CV disease, CV risk reduction, and recovery process will improve Outcome: Progressing Goal: Individualized Educational Video(s) Outcome: Progressing   Problem: Activity: Goal: Ability to return to baseline activity level will improve Outcome: Progressing   Problem: Cardiovascular: Goal: Ability to achieve and maintain adequate cardiovascular perfusion will improve Outcome: Progressing Goal: Vascular access site(s) Level 0-1 will be maintained Outcome: Progressing   Problem: Health Behavior/Discharge Planning: Goal: Ability to safely manage health-related needs after discharge will  improve Outcome: Progressing   Problem: Education: Goal: Knowledge of General Education information will improve Description: Including pain rating scale, medication(s)/side effects and non-pharmacologic comfort measures Outcome: Progressing   Problem: Health Behavior/Discharge Planning: Goal: Ability to manage health-related needs will improve Outcome: Progressing   Problem: Clinical Measurements: Goal: Ability to maintain clinical measurements within normal limits will improve Outcome: Progressing Goal: Will remain free from infection Outcome: Progressing Goal: Diagnostic test results will improve Outcome: Progressing Goal: Respiratory complications will improve Outcome: Progressing Goal: Cardiovascular complication will be avoided Outcome: Progressing   Problem: Activity: Goal: Risk for activity intolerance will decrease Outcome: Progressing   Problem: Nutrition: Goal: Adequate nutrition will be maintained Outcome: Progressing   Problem: Coping: Goal: Level of anxiety will decrease Outcome: Progressing   Problem: Elimination: Goal: Will not experience complications related to bowel motility Outcome: Progressing Goal: Will not experience complications related to urinary retention Outcome: Progressing   Problem: Pain Managment: Goal: General experience of comfort will improve and/or be controlled Outcome: Progressing   Problem: Safety: Goal: Ability to remain free from injury will improve Outcome: Progressing   Problem: Skin Integrity: Goal: Risk for impaired skin integrity will decrease Outcome: Progressing   Problem: Education: Goal: Ability to describe self-care measures that may prevent or decrease complications (Diabetes Survival Skills Education) will improve Outcome: Progressing Goal: Individualized Educational Video(s) Outcome: Progressing   Problem: Coping: Goal: Ability to adjust to condition or change in health will improve Outcome:  Progressing   Problem: Fluid Volume: Goal: Ability to maintain a balanced intake and output will improve Outcome: Progressing   Problem: Health Behavior/Discharge Planning: Goal: Ability to identify and utilize available resources and services will improve Outcome: Progressing Goal: Ability to manage health-related needs will improve Outcome: Progressing   Problem: Metabolic: Goal: Ability to maintain appropriate glucose levels will improve Outcome: Progressing   Problem: Nutritional: Goal: Maintenance of adequate nutrition will improve Outcome: Progressing Goal: Progress toward achieving an optimal weight will improve Outcome: Progressing   Problem: Skin Integrity: Goal: Risk for impaired skin  integrity will decrease Outcome: Progressing   Problem: Tissue Perfusion: Goal: Adequacy of tissue perfusion will improve Outcome: Progressing

## 2023-08-22 NOTE — Progress Notes (Signed)
   Palliative Medicine Inpatient Follow Up Note HPI: 89 y/o female with PMH for atrial fibrillation on Eliquis , non-ischemic cardiomyopathy, LBBB, chronic HFrEF, depression, and anxiety, now presenting with right wrist injury after a fall. Found to have a R distal radius fracture s/p ORIF 08/09/2023 who was d/c from [facility] 5/2.   Today's Discussion 08/22/2023  *Please note that this is a verbal dictation therefore any spelling or grammatical errors are due to the "[employer]agon Medical One" system interpretation.  Chart reviewed inclusive of vital signs, progress notes, laboratory results, and diagnostic images.   I met this morning with patients son, daughter, and SIL.   I met with [name] at bedside this morning she awake and able to share with me that she does not desire a feeding tube long term or to live if she is unable to care for herself.   We discussed the idea of comfort care. We talked about transition to comfort measures in house and what that would entail inclusive of medications to control pain, dyspnea, agitation, nausea, itching, and hiccups.   We discussed stopping all uneccessary measures such as cardiac monitoring, blood draws, needle sticks, and frequent vital signs.   We reviewed transition to hospice home in [place].   Utilized reflective listening throughout our time together.   Patients family agree to allow [name] to be at peace. They emphasize her readiness to pass for many years and how she would not want to live as she is now.   Plan for family to visit this afternoon and comfort medications ATC thereafter.   Questions and concerns addressed/Palliative Support Provided.   Objective Assessment: Vital Signs Vitals:   08/22/23 0429 08/22/23 0827  BP: (!) 119/58 116/61  Pulse: (!) 104 98  Resp: 20 20  Temp: 99.7 F (37.6 C) 97.8 F (36.6 C)  SpO2: 97% 96%    Intake/Output Summary (Last 24 hours) at 08/22/2023 1349 Last data filed at 08/22/2023 0441 Gross per 24  hour  Intake 839.5 ml  Output 2250 ml  Net -1410.5 ml   Last Weight  Most recent update: 08/21/2023  5:59 AM    Weight  72.4 kg (159 lb 9.8 oz)            Gen:  Elderly Caucasian F chronically ill appearing HEENT: NGT, dry mucous membranes CV: Regular rate and irregular rhythm PULM:On RA, breathing is non-labored   ABD: soft/nontender  EXT: Pedal edema  Neuro: Somnolent  SUMMARY OF RECOMMENDATIONS   DNAR/DNI   Comfort Care  Morphine  2mg  IVP Q6H ATC  Additional comfort medications per MAR  DC coretrack  Unrestricted visitation  Plan to transition to hospice home in [place]  Ongoing PMT support ______________________________________________________________________________________ Camille C[facility] [place] Palliative Medicine Team Team Cell Phone: [phone] Please utilize secure chat with additional questions, if there is no response within 30 minutes please call the above phone number  Time Spent: 65 Billing based on MDM: High

## 2023-08-22 NOTE — Plan of Care (Signed)
 Noted that pt is going to comfort care with hospice placement. Neurology will sign off for now and please call with any questions. Thanks for the opportunity taking care of this pt.   Consuelo Denmark, MD PhD Stroke Neurology 08/22/2023 1:31 PM

## 2023-08-22 NOTE — Discharge Summary (Signed)
 DISCHARGE SUMMARY  [name]  MR#: [id number]  DOB:11/14/1930  Date of Admission: 08/19/2023 Date of Discharge: 08/22/2023  Attending Physician:Catherine Bagdasarian Constantine Delude, MD  Patient's OZH:[id number], Catherine Leriche, MD  Disposition: D/C to residential hospice facility   Discharge Diagnoses: Acute R frontotemporal ischemic CVA - status post emergent R M1 thrombectomy Dysphagia due to CVA Permanent atrial fibrillation on chronic Eliquis  - acute RVR  Chronic systolic congestive heart failure without acute exacerbation Recent R distal radial fracture NO CODE BLUE - DNR  Comfort focused care   Initial presentation: 89 year old with a history of atrial fibrillation on chronic Eliquis , nonischemic cardiomyopathy with chronic systolic CHF, LBBB, depression/anxiety, and a right distal radius fracture requiring ORIF with discharge from the hospital 08/11/2023 who returned to the ER 5/10 from her SNF when she was found slumped over in a chair. EMS noted left-sided facial droop and left arm weakness with left neglect. CT head revealed no acute abnormality. CTa head and neck was positive for an acute occlusion of the right MCA at the proximal right M1 segment with a 35 mL acute infarct involving the right frontotemporal region. She was taken to IR emergently and underwent a right M1 thrombectomy.   Hospital Course: 5/10 admit to ICU after intubated for emergent R M1 thrombectomy for acute R frontotemporal CVA  5/10 afternoon - extubated 5/12 [facility] assumed care   Acute R frontotemporal ischemic CVA - status post emergent R M1 thrombectomy Care was directed by Neurology - felt to have likely been cardioembolic in origin -TTE noted EF 25-30% with global LV hypokinesis and a mildly enlarged RV with no significant valvular abnormalities   Dysphagia due to CVA SLP evaluated - remained Cortrak dependant during her acute care stay as her swallowing function did not improve - she is not an appropriate candidate for  long term feeding tube use, and family has noted that she would not desire to prolong her life this way regardless    Permanent atrial fibrillation on chronic Eliquis  - acute RVR  Rate control was able to be achieved through titration of BB dose during her acute stay    Chronic systolic congestive heart failure without acute exacerbation TTE 2024 noted EF 25-30% - EF unchanged on follow-up TTE this admission   Recent R distal radial fracture Status post ORIF - was in SNF rehab at time of CVA - R arm dressed and dry    Goals of Care Family met with Palliative care and decided "to allow [name] to be at peace. They emphasize her readiness to pass for many years and how she would not want to live as she is now." - as a result, the care plan has transitioned to comfort focused care, with the patient being discharged to a residential hospice facility - the medical team fully supports this approach, and feels it is the best tx plan for her    Allergies as of 08/22/2023       Reactions   Ace Inhibitors Cough   Unknown rxn on MAR.   Warfarin Sodium Other (See Comments)   DOSE RELATED PHARMACOLOGIC EFFECT "bleed out"   Famotidine  Other (See Comments)   GI upset   Vancomycin  Nausea Only   Codeine Other (See Comments)   sedation   Delsym [dextromethorphan Polistirex Er] Other (See Comments)   dizziness   Lactose Intolerance (gi) Diarrhea   Lasix  [furosemide ] Diarrhea   Oral Lasix  gives her diarrhea, tolerates IV Rx   Phenylephrine  Palpitations, Other (See Comments)  Nasal spray- "likely increase in nasal congestion".    Sulfamethoxazole-trimethoprim Nausea And Vomiting   GI intolerance.        Medication List     STOP taking these medications    acetaminophen  500 MG tablet Commonly known as: TYLENOL  Replaced by: acetaminophen  160 MG/5ML solution   apixaban  2.5 MG Tabs tablet Commonly known as: Eliquis    bisacodyl  5 MG EC tablet Commonly known as: DULCOLAX   calcium  carbonate  500 MG chewable tablet Commonly known as: TUMS - dosed in mg elemental calcium    cholecalciferol  25 MCG (1000 UNIT) tablet Commonly known as: VITAMIN D3   digoxin  0.125 MG tablet Commonly known as: LANOXIN    fluticasone  50 MCG/ACT nasal spray Commonly known as: FLONASE    IVIZIA DRY EYES OP   Klor-Con  M20 20 MEQ tablet Generic drug: potassium chloride  SA   loperamide  2 MG capsule Commonly known as: IMODIUM    loratadine 10 MG tablet Commonly known as: CLARITIN   metoprolol  200 MG 24 hr tablet Commonly known as: Toprol  XL   mupirocin  ointment 2 % Commonly known as: BACTROBAN    nitrofurantoin 100 MG capsule Commonly known as: MACRODANTIN   ondansetron  4 MG tablet Commonly known as: Zofran  Replaced by: ondansetron  4 MG/2ML Soln injection   pantoprazole  20 MG tablet Commonly known as: PROTONIX    senna-docusate 8.6-50 MG tablet Commonly known as: Senokot-S   torsemide  20 MG tablet Commonly known as: DEMADEX    traMADol  50 MG tablet Commonly known as: ULTRAM    venlafaxine  XR 37.5 MG 24 hr capsule Commonly known as: EFFEXOR -XR       TAKE these medications    acetaminophen  160 MG/5ML solution Commonly known as: TYLENOL  Take 20.3 mLs (650 mg total) by mouth every 6 (six) hours as needed for mild pain (pain score 1-3), headache or fever. Replaces: acetaminophen  500 MG tablet   acetaminophen  650 MG suppository Commonly known as: TYLENOL  Place 1 suppository (650 mg total) rectally every 4 (four) hours as needed for mild pain (pain score 1-3) (or temp > 37.5 C (99.5 F)).   antiseptic oral rinse Liqd Apply 15 mLs topically as needed for dry mouth.   glycopyrrolate 1 MG tablet Commonly known as: ROBINUL Take 1 tablet (1 mg total) by mouth every 4 (four) hours as needed (excessive secretions).   glycopyrrolate 0.2 MG/ML injection Commonly known as: ROBINUL Inject 1 mL (0.2 mg total) into the skin every 4 (four) hours as needed (excessive secretions).    glycopyrrolate 0.2 MG/ML injection Commonly known as: ROBINUL Inject 1 mL (0.2 mg total) into the vein every 4 (four) hours as needed (excessive secretions).   LORazepam 2 MG/ML injection Commonly known as: ATIVAN Inject 0.25-0.5 mLs (0.5-1 mg total) into the vein every hour as needed for anxiety, seizure or sedation.   morphine  (PF) 2 MG/ML injection Inject 1 mL (2 mg total) into the vein every 6 (six) hours.   morphine  (PF) 2 MG/ML injection Inject 1-2 mLs (2-4 mg total) into the vein every hour as needed (Pain/Dyspnea).   ondansetron  4 MG/2ML Soln injection Commonly known as: ZOFRAN  Inject 2 mLs (4 mg total) into the vein every 6 (six) hours as needed for nausea. Replaces: ondansetron  4 MG tablet   polyvinyl alcohol  1.4 % ophthalmic solution Commonly known as: LIQUIFILM TEARS Place 1 drop into both eyes 4 (four) times daily as needed for dry eyes.        Day of Discharge BP 116/61 (BP Location: Left Arm)   Pulse 98  Temp 97.8 F (36.6 C) (Oral)   Resp 20   Ht 5\' 6"  (1.676 m)   Wt 72.4 kg   SpO2 96%   BMI 25.76 kg/m   Physical Exam: General: No acute respiratory distress Lungs: Clear to auscultation bilaterally  Cardiovascular: irreg irreg - rate ~90bpm Abdomen: benign Extremities: trace B LE edema   Basic Metabolic Panel: Recent Labs  Lab 08/19/23 0014 08/19/23 0022 08/19/23 0358 08/19/23 0912 08/20/23 1008 08/20/23 1021 08/20/23 1647 08/21/23 0652 08/22/23 0601  NA 137 135 135 137 139  --   --  140 139  K 4.2 4.2 3.6 4.1 3.6  --   --  3.7 3.8  CL 100 100  --  101 107  --   --  108 104  CO2 24  --   --  24 23  --   --  23 26  GLUCOSE 122* 117*  --  102* 100*  --   --  137* 132*  BUN 21 22  --  15 9  --   --  16 18  CREATININE 0.88 0.90  --  0.72 0.65  --   --  0.71 0.69  CALCIUM  9.2  --   --  8.8* 8.6*  --   --  9.6 9.2  MG  --   --   --  1.9  --  1.9 2.0 2.1  --   PHOS  --   --   --  3.2  --  2.3* 2.3* 2.6  --     CBC: Recent Labs  Lab  08/19/23 0014 08/19/23 0022 08/19/23 0358 08/19/23 0912 08/20/23 1008 08/21/23 0652 08/22/23 0601  WBC 9.3  --   --  10.6* 9.6 8.3 10.4  NEUTROABS 6.6  --   --   --   --   --   --   HGB 11.5*   < > 9.9* 10.4* 9.5* 10.4* 10.6*  HCT 35.8*   < > 29.0* 32.2* 30.3* 32.4* 32.6*  MCV 100.6*  --   --  99.7 102.7* 99.7 99.4  PLT 332  --   --  296 242 249 280   < > = values in this interval not displayed.     Time spent in discharge (includes decision making & examination of pt): 35 minutes  08/22/2023, 2:27 PM   Abbe Abate, MD [facility] Office  [phone]

## 2023-08-22 NOTE — Progress Notes (Signed)
 Patient discharged to [facility] in [place], transported by [employer]

## 2023-08-22 NOTE — TOC Progression Note (Signed)
 Transition of Care [facility]) - Progression Note    Patient Details  Name: [name] MRN: [id number] Date of Birth: 1931/02/10  [facility] CM/SW Contact  Tandy Fam, [place] Phone Number: 08/22/2023, 10:34 AM  Clinical Narrative:   CSW notified by palliative NP that patient's family has chosen to transition to comfort care, preference for Hospice Home in [place]. Referral given to [facility], awaiting review. CSW to follow.    Expected Discharge Plan: Hospice Medical Facility Barriers to Discharge: Continued Medical Work up, Hospice Bed not available  Expected Discharge Plan and Services                                               Social Determinants of Health (SDOH) Interventions SDOH Screenings   Food Insecurity: No Food Insecurity (08/06/2023)  Housing: Low Risk  (08/06/2023)  Transportation Needs: No Transportation Needs (08/06/2023)  Utilities: Not At Risk (08/06/2023)  Alcohol  Screen: Low Risk  (08/11/2022)  Depression (PHQ2-9): High Risk (05/12/2023)  Financial Resource Strain: Low Risk  (07/12/2021)  Physical Activity: Sufficiently Active (08/11/2022)  Social Connections: Moderately Integrated (08/06/2023)  Stress: No Stress Concern Present (08/11/2022)  Tobacco Use: Low Risk  (08/19/2023)    Readmission Risk Interventions     No data to display

## 2023-08-22 NOTE — TOC Transition Note (Signed)
 Transition of Care [facility]) - Discharge Note   Patient Details  Name: [name] MRN: [id number] Date of Birth: 11-23-1930  [facility]) CM/SW Contact:  Tandy Fam, LCSW Phone Number: 08/22/2023, 3:12 PM   Clinical Narrative:   CSW notified by [facility] that bed is available for patient at [facility] in [place], transport requested for 4:00 or after. CSW coordinated with MD and RN about discharge timing. Transport arranged for 4:00 pm per hospice request. No further TOC needs at this time.      Final next level of care: Hospice Medical Facility Barriers to Discharge: Barriers Resolved   Patient Goals and CMS Choice            Discharge Placement                Patient to be transferred to facility by: [facility]   Patient and family notified of of transfer: 08/22/23  Discharge Plan and Services Additional resources added to the After Visit Summary for                                       Social Drivers of Health (SDOH) Interventions SDOH Screenings   Food Insecurity: No Food Insecurity (08/06/2023)  Housing: Low Risk  (08/06/2023)  Transportation Needs: No Transportation Needs (08/06/2023)  Utilities: Not At Risk (08/06/2023)  Alcohol  Screen: Low Risk  (08/11/2022)  Depression (PHQ2-9): High Risk (05/12/2023)  Financial Resource Strain: Low Risk  (07/12/2021)  Physical Activity: Sufficiently Active (08/11/2022)  Social Connections: Moderately Integrated (08/06/2023)  Stress: No Stress Concern Present (08/11/2022)  Tobacco Use: Low Risk  (08/19/2023)     Readmission Risk Interventions     No data to display

## 2023-08-23 NOTE — Care Management Important Message (Signed)
 Important Message  Patient Details  Name: [name] MRN: [id number] Date of Birth: [birth year]   Important Message Given:  Yes - Medicare IM     Wynonia Hedges 08/23/2023, 2:42 PM

## 2023-08-30 ENCOUNTER — Telehealth: Payer: Self-pay | Admitting: Family Medicine

## 2023-08-30 NOTE — Telephone Encounter (Signed)
 Late entry.  I called patient's daughter yesterday to give my condolences.  I was always glad to see this kind lady in clinic and she will be missed.  She thanked me for the call.

## 2023-09-05 ENCOUNTER — Ambulatory Visit: Payer: Medicare Other | Admitting: Cardiovascular Disease

## 2023-09-10 DEATH — deceased

## 2023-09-13 ENCOUNTER — Ambulatory Visit: Payer: Medicare Other | Admitting: Cardiovascular Disease
